# Patient Record
Sex: Male | Born: 1950
Health system: Southern US, Community
[De-identification: ages and names within clinical notes are randomized; demographics above are authoritative.]

## PROBLEM LIST (undated history)

## (undated) DIAGNOSIS — M21611 Bunion of right foot: Secondary | ICD-10-CM

## (undated) DIAGNOSIS — M1990 Unspecified osteoarthritis, unspecified site: Secondary | ICD-10-CM

## (undated) DIAGNOSIS — C169 Malignant neoplasm of stomach, unspecified: Secondary | ICD-10-CM

## (undated) DIAGNOSIS — I447 Left bundle-branch block, unspecified: Secondary | ICD-10-CM

## (undated) DIAGNOSIS — I2510 Atherosclerotic heart disease of native coronary artery without angina pectoris: Secondary | ICD-10-CM

## (undated) DIAGNOSIS — I2729 Other secondary pulmonary hypertension: Secondary | ICD-10-CM

## (undated) DIAGNOSIS — I10 Essential (primary) hypertension: Secondary | ICD-10-CM

## (undated) DIAGNOSIS — Z95810 Presence of automatic (implantable) cardiac defibrillator: Secondary | ICD-10-CM

## (undated) DIAGNOSIS — I428 Other cardiomyopathies: Secondary | ICD-10-CM

## (undated) DIAGNOSIS — I5042 Chronic combined systolic (congestive) and diastolic (congestive) heart failure: Secondary | ICD-10-CM

## (undated) DIAGNOSIS — T783XXA Angioneurotic edema, initial encounter: Secondary | ICD-10-CM

## (undated) DIAGNOSIS — Z86010 Personal history of colonic polyps: Secondary | ICD-10-CM

## (undated) DIAGNOSIS — K5730 Diverticulosis of large intestine without perforation or abscess without bleeding: Secondary | ICD-10-CM

## (undated) DIAGNOSIS — C61 Malignant neoplasm of prostate: Secondary | ICD-10-CM

## (undated) DIAGNOSIS — E119 Type 2 diabetes mellitus without complications: Secondary | ICD-10-CM

## (undated) DIAGNOSIS — Z8679 Personal history of other diseases of the circulatory system: Secondary | ICD-10-CM

## (undated) HISTORY — DX: Left bundle-branch block, unspecified: I44.7

## (undated) HISTORY — DX: Other secondary pulmonary hypertension: I27.29

## (undated) HISTORY — DX: Type 2 diabetes mellitus without complications: E11.9

## (undated) HISTORY — DX: Angioneurotic edema, initial encounter: T78.3XXA

## (undated) HISTORY — PX: INSERT / REPLACE / REMOVE PACEMAKER: SUR710

## (undated) HISTORY — DX: Other cardiomyopathies: I42.8

## (undated) HISTORY — DX: Atherosclerotic heart disease of native coronary artery without angina pectoris: I25.10

## (undated) HISTORY — DX: Chronic combined systolic (congestive) and diastolic (congestive) heart failure: I50.42

---

## 2007-02-13 HISTORY — PX: OTHER SURGICAL HISTORY: SHX169

## 2013-02-02 ENCOUNTER — Ambulatory Visit (INDEPENDENT_AMBULATORY_CARE_PROVIDER_SITE_OTHER): Payer: BC Managed Care – PPO | Admitting: Podiatry

## 2013-02-02 ENCOUNTER — Encounter: Payer: Self-pay | Admitting: Podiatry

## 2013-02-02 VITALS — BP 136/96 | HR 86 | Resp 12

## 2013-02-02 DIAGNOSIS — M79609 Pain in unspecified limb: Secondary | ICD-10-CM

## 2013-02-02 DIAGNOSIS — B351 Tinea unguium: Secondary | ICD-10-CM

## 2013-02-02 NOTE — Progress Notes (Signed)
Subjective:     Patient ID: Jon Williams, male   DOB: [birth year], 62 y.o.   MRN: [id number]  HPI patient presents with thick dystrophic nails of both feet that are impossible for him to cut and painful   Review of Systems     Objective:   Physical Exam Neurovascular status intact with no health history changes noted thick damage nail plates 1-5 both feet that are painful when pressed    Assessment:     Mycotic nail infection with pain 1-5 both feet    Plan:     Debridement painful nail bed 1-5 both feet with no bleeding noted

## 2013-08-03 ENCOUNTER — Ambulatory Visit: Payer: BC Managed Care – PPO | Admitting: Podiatry

## 2013-08-03 ENCOUNTER — Ambulatory Visit (INDEPENDENT_AMBULATORY_CARE_PROVIDER_SITE_OTHER): Payer: BC Managed Care – PPO | Admitting: Podiatry

## 2013-08-03 ENCOUNTER — Encounter: Payer: Self-pay | Admitting: Podiatry

## 2013-08-03 DIAGNOSIS — B351 Tinea unguium: Secondary | ICD-10-CM

## 2013-08-03 DIAGNOSIS — M79673 Pain in unspecified foot: Secondary | ICD-10-CM

## 2013-08-03 DIAGNOSIS — M79609 Pain in unspecified limb: Secondary | ICD-10-CM

## 2013-08-03 NOTE — Progress Notes (Signed)
Subjective:     Patient ID: Jon Williams, male   DOB: 1950/05/29, 63 y.o.   MRN: [id number]  HPI patient presents with thick nailbeds 1-5 both feet that are bothersome and cannot be taken care of by him and also lesions between the big toe second toe right   Review of Systems     Objective:   Physical Exam Neurovascular status intact with moderate structural deformity and nails that are thickened and painful 1-5 both feet    Assessment:     Mycotic nail infection and structural deformity leading to callus formation    Plan:     Debridement nailbeds 1-5 both feet lesions on right foot and reappoint in 3 months

## 2013-11-09 ENCOUNTER — Ambulatory Visit (INDEPENDENT_AMBULATORY_CARE_PROVIDER_SITE_OTHER): Payer: Commercial Managed Care - HMO | Admitting: Podiatry

## 2013-11-09 DIAGNOSIS — L84 Corns and callosities: Secondary | ICD-10-CM

## 2013-11-09 DIAGNOSIS — B351 Tinea unguium: Secondary | ICD-10-CM

## 2013-11-09 DIAGNOSIS — M79609 Pain in unspecified limb: Secondary | ICD-10-CM

## 2013-11-09 DIAGNOSIS — M79673 Pain in unspecified foot: Secondary | ICD-10-CM

## 2013-11-09 DIAGNOSIS — M779 Enthesopathy, unspecified: Secondary | ICD-10-CM

## 2013-11-09 NOTE — Progress Notes (Signed)
   Subjective:    Patient ID: Jon Williams, male    DOB: 29-Nov-1950, 63 y.o.   MRN: [id number]  HPI  Pt presents for nail debridement  Review of Systems     Objective:   Physical Exam        Assessment & Plan:

## 2013-11-11 NOTE — Progress Notes (Signed)
Subjective:     Patient ID: Jon Williams, male   DOB: 03/21/1950, 63 y.o.   MRN: [id number]  HPI patient presents with nail disease 1-5 both feet that he cannot cut and are thick and painful   Review of Systems     Objective:   Physical Exam Neurovascular status intact with thick yellow brittle nailbeds 1-5 both feet that are painful    Assessment:     Mycotic nail infection with pain 1-5 both feet    Plan:     Debride painful nailbeds 1-5 both feet with no iatrogenic bleeding noted      

## 2013-11-23 ENCOUNTER — Other Ambulatory Visit: Payer: BC Managed Care – PPO

## 2014-02-15 ENCOUNTER — Ambulatory Visit (INDEPENDENT_AMBULATORY_CARE_PROVIDER_SITE_OTHER): Payer: Commercial Managed Care - HMO | Admitting: Podiatry

## 2014-02-15 DIAGNOSIS — B351 Tinea unguium: Secondary | ICD-10-CM

## 2014-02-15 DIAGNOSIS — M79673 Pain in unspecified foot: Secondary | ICD-10-CM | POA: Diagnosis not present

## 2014-02-15 NOTE — Progress Notes (Signed)
Subjective:     Patient ID: Jon Williams, male   DOB: 08/27/1950, 64 y.o.   MRN: [id number]  HPI patient presents with nail disease 1-5 both feet that he cannot cut and are thick and painful   Review of Systems     Objective:   Physical Exam Neurovascular status intact with thick yellow brittle nailbeds 1-5 both feet that are painful    Assessment:     Mycotic nail infection with pain 1-5 both feet    Plan:     Debride painful nailbeds 1-5 both feet with no iatrogenic bleeding noted

## 2014-02-22 DIAGNOSIS — N400 Enlarged prostate without lower urinary tract symptoms: Secondary | ICD-10-CM | POA: Diagnosis not present

## 2014-05-10 ENCOUNTER — Ambulatory Visit (INDEPENDENT_AMBULATORY_CARE_PROVIDER_SITE_OTHER): Payer: Commercial Managed Care - HMO | Admitting: Podiatry

## 2014-05-10 DIAGNOSIS — M79673 Pain in unspecified foot: Secondary | ICD-10-CM

## 2014-05-10 DIAGNOSIS — L84 Corns and callosities: Secondary | ICD-10-CM

## 2014-05-10 DIAGNOSIS — B351 Tinea unguium: Secondary | ICD-10-CM | POA: Diagnosis not present

## 2014-05-10 NOTE — Progress Notes (Signed)
Subjective:     Patient ID: Jon Williams, male   DOB: 02-Nov-1950, 64 y.o.   MRN: [id number]  HPI patient presents with nail disease 1-5 both feet that he cannot cut and are thick and painful. Patient also has lesions on both feet which are sore and he cannot cut   Review of Systems     Objective:   Physical Exam Neurovascular status intact with thick yellow brittle nailbeds 1-5 both feet that are painful and also noted to have thick keratotic lesions that are painful    Assessment:     Mycotic nail infection with pain 1-5 both feet with keratotic lesion formation    Plan:     Debride painful nailbeds 1-5 both feet with no iatrogenic bleeding noted and debrided lesions plantar feet with no iatrogenic bleeding noted

## 2014-08-11 ENCOUNTER — Emergency Department (HOSPITAL_COMMUNITY)
Admission: EM | Admit: 2014-08-11 | Discharge: 2014-08-11 | Disposition: A | Discharge status: Home / Self Care | Payer: Commercial Managed Care - HMO | Attending: Emergency Medicine | Admitting: Emergency Medicine

## 2014-08-11 ENCOUNTER — Encounter (HOSPITAL_COMMUNITY): Payer: Self-pay | Admitting: *Deleted

## 2014-08-11 ENCOUNTER — Emergency Department (HOSPITAL_COMMUNITY): Payer: Commercial Managed Care - HMO

## 2014-08-11 DIAGNOSIS — M2011 Hallux valgus (acquired), right foot: Secondary | ICD-10-CM | POA: Diagnosis not present

## 2014-08-11 DIAGNOSIS — M79671 Pain in right foot: Secondary | ICD-10-CM | POA: Diagnosis present

## 2014-08-11 DIAGNOSIS — L03031 Cellulitis of right toe: Secondary | ICD-10-CM | POA: Diagnosis not present

## 2014-08-11 DIAGNOSIS — M19071 Primary osteoarthritis, right ankle and foot: Secondary | ICD-10-CM | POA: Diagnosis not present

## 2014-08-11 MED ORDER — CLINDAMYCIN HCL 150 MG PO CAPS: 300.0000 mg | ORAL_CAPSULE | Freq: Four times a day (QID) | ORAL | Status: DC

## 2014-08-11 MED ORDER — OXYCODONE-ACETAMINOPHEN 5-325 MG PO TABS
ORAL_TABLET | ORAL | Status: AC
  Filled 2014-08-11: qty 1

## 2014-08-11 MED ORDER — HYDROCODONE-ACETAMINOPHEN 5-325 MG PO TABS: 2.0000 | ORAL_TABLET | ORAL | Status: DC | PRN

## 2014-08-11 MED ORDER — OXYCODONE-ACETAMINOPHEN 5-325 MG PO TABS
1.0000 | ORAL_TABLET | Freq: Once | ORAL | Status: AC
  Administered 2014-08-11: 1 via ORAL

## 2014-08-11 NOTE — ED Notes (Signed)
Pt reports having swelling and pain to big toe and foot, states he had blistered area that has ruptured and now bleeding. Bleeding controlled at this time.

## 2014-08-11 NOTE — Discharge Instructions (Signed)

## 2014-08-11 NOTE — ED Provider Notes (Signed)
CSN: [id number]     Arrival date & time 08/11/14  1840 History   First MD Initiated Contact with Patient 08/11/14 2016     Chief Complaint  Patient presents with  . Foot Pain     (Consider location/radiation/quality/duration/timing/severity/associated sxs/prior Treatment) HPI Comments: Patient presents to the ER for evaluation of pain, swelling and bleeding from a lesion that has been on the inside portion of his big toe for some time. Patient reports that the area ruptured earlier today and has been draining blood and fluid. He was unable to see his doctor today, will see him tomorrow.  Patient is a 64 y.o. male presenting with lower extremity pain.  Foot Pain    History reviewed. No pertinent past medical history. History reviewed. No pertinent past surgical history. History reviewed. No pertinent family history. History  Substance Use Topics  . Smoking status: Never Smoker   . Smokeless tobacco: Not on file  . Alcohol Use: No    Review of Systems  Skin: Positive for wound.  All other systems reviewed and are negative.     Allergies  Aspirin and Sulfa antibiotics  Home Medications   Prior to Admission medications   Not on File   BP 163/79 mmHg  Pulse 63  Temp(Src) 98.1 F (36.7 C) (Oral)  Resp 18  SpO2 100% Physical Exam  Constitutional: He is oriented to person, place, and time. He appears well-developed and well-nourished. No distress.  HENT:  Head: Normocephalic and atraumatic.  Right Ear: Hearing normal.  Left Ear: Hearing normal.  Nose: Nose normal.  Mouth/Throat: Oropharynx is clear and moist and mucous membranes are normal.  Eyes: Conjunctivae and EOM are normal. Pupils are equal, round, and reactive to light.  Neck: Normal range of motion. Neck supple.  Cardiovascular: Regular rhythm, S1 normal and S2 normal.  Exam reveals no gallop and no friction rub.   No murmur heard. Pulmonary/Chest: Effort normal and breath sounds normal. No respiratory  distress. He exhibits no tenderness.  Abdominal: Soft. Normal appearance and bowel sounds are normal. There is no hepatosplenomegaly. There is no tenderness. There is no rebound, no guarding, no tenderness at McBurney's point and negative Murphy's sign. No hernia.  Musculoskeletal: Normal range of motion.  Neurological: He is alert and oriented to person, place, and time. He has normal strength. No cranial nerve deficit or sensory deficit. Coordination normal. GCS eye subscore is 4. GCS verbal subscore is 5. GCS motor subscore is 6.  Skin: Skin is warm, dry and intact. No rash noted. No cyanosis.  Area of bleeding on the medial aspect of great toe and the toe web space with small raised lesion consistent with thickened callus or corn formation  Psychiatric: He has a normal mood and affect. His speech is normal and behavior is normal. Thought content normal.  Nursing note and vitals reviewed.   ED Course  Procedures (including critical care time) Labs Review Labs Reviewed - No data to display  Imaging Review Dg Foot Complete Right  08/11/2014   CLINICAL DATA:  64 year old male with right foot pain localizing to the great toe  EXAM: RIGHT FOOT COMPLETE - 3+ VIEW  COMPARISON:  None.  FINDINGS: Mild hallux valgus angulation of the great toe with bony bunion formation. There is associated secondary degenerative osteoarthritis. Trophic changes are present at the nail of the great toe as well. There is no evidence of acute fracture or malalignment. Normal bony mineralization. No lytic or blastic osseous lesion.  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   Electronically Signed   By: Jacqulynn Cadet M.D.   On: 08/11/2014 21:40     EKG Interpretation None      MDM   Final diagnoses:  None    Spontaneous drainage and bleeding from an area where there was a pre-existing lesion in the webspace of  his first and second toes on the right foot. X-ray does not show any evidence of osteomyelitis. Will cover with antibiotic, analgesia, follow up with his doctors.    Orpah Greek, MD 08/11/14 2151

## 2014-08-11 NOTE — ED Notes (Signed)
Dr. Pollina at bedside   

## 2014-08-12 ENCOUNTER — Encounter: Payer: Self-pay | Admitting: Podiatry

## 2014-08-12 ENCOUNTER — Ambulatory Visit (INDEPENDENT_AMBULATORY_CARE_PROVIDER_SITE_OTHER): Payer: Commercial Managed Care - HMO | Admitting: Podiatry

## 2014-08-12 VITALS — BP 144/80 | HR 46 | Resp 15

## 2014-08-12 DIAGNOSIS — M79606 Pain in leg, unspecified: Secondary | ICD-10-CM | POA: Diagnosis not present

## 2014-08-12 DIAGNOSIS — L84 Corns and callosities: Secondary | ICD-10-CM

## 2014-08-12 DIAGNOSIS — B351 Tinea unguium: Secondary | ICD-10-CM | POA: Diagnosis not present

## 2014-08-12 NOTE — Progress Notes (Signed)
Subjective:     Patient ID: Jon Williams, male   DOB: [birth year], 64 y.o.   MRN: [id number]  HPI patient presents with lesions between the big toe second toe of both feet and presents with nail disease 1-5 both feet with thick yellow brittle debris. Also has significant structural deformity of both feet in the forefoot with bunion and digital deformities noted   Review of Systems     Objective:   Physical Exam Neurovascular status unchanged with significant structural deformity of both feet and severe keratotic lesions between the hallux and second toes that are painful along with thick and yellow brittle nailbeds 1-5 of both feet    Assessment:     Lesion secondary to pressure along with mycotic nail infection 1-5 both feet with pain    Plan:     Reviewed both conditions and today I debrided nailbeds 1-5 both feet with no iatrogenic bleeding and lesions between the big toe and second toe with no iatrogenic bleeding and will reappoint for routine care with consideration some day for surgery that we will like to avoid as much as possible

## 2014-08-18 ENCOUNTER — Ambulatory Visit: Payer: Commercial Managed Care - HMO | Admitting: Podiatry

## 2014-09-06 ENCOUNTER — Ambulatory Visit: Payer: Commercial Managed Care - HMO

## 2014-10-04 DIAGNOSIS — Z0100 Encounter for examination of eyes and vision without abnormal findings: Secondary | ICD-10-CM | POA: Diagnosis not present

## 2014-10-21 ENCOUNTER — Ambulatory Visit (INDEPENDENT_AMBULATORY_CARE_PROVIDER_SITE_OTHER): Payer: Commercial Managed Care - HMO | Admitting: Podiatry

## 2014-10-21 ENCOUNTER — Encounter: Payer: Self-pay | Admitting: Podiatry

## 2014-10-21 DIAGNOSIS — B351 Tinea unguium: Secondary | ICD-10-CM

## 2014-10-21 DIAGNOSIS — L84 Corns and callosities: Secondary | ICD-10-CM | POA: Diagnosis not present

## 2014-10-21 DIAGNOSIS — M79606 Pain in leg, unspecified: Secondary | ICD-10-CM

## 2014-10-21 DIAGNOSIS — L97511 Non-pressure chronic ulcer of other part of right foot limited to breakdown of skin: Secondary | ICD-10-CM

## 2014-10-21 NOTE — Progress Notes (Signed)
Patient ID: Jon Williams, male   DOB: Oct 24, 1950, 64 y.o.   MRN: [id number] Complaint:  Visit Type: Patient returns to my office for continued preventative foot care services. Complaint: Patient states" my nails have grown long and thick and become painful to walk and wear shoes" Patient has been diagnosed with DM with no foot complications. The patient presents for preventative foot care services. No changes to ROS.  He states he develops severe callus between his first na second toes both feet.  Podiatric Exam: Vascular: dorsalis pedis and posterior tibial pulses are palpable bilateral. Capillary return is immediate. Temperature gradient is WNL. Skin turgor WNL  Sensorium: Normal Semmes Weinstein monofilament test. Normal tactile sensation bilaterally. Nail Exam: Pt has thick disfigured discolored nails with subungual debris noted bilateral entire nail hallux through fifth toenails Ulcer Exam: There is no evidence of ulcer or pre-ulcerative changes or infection. Orthopedic Exam: Muscle tone and strength are WNL. No limitations in general ROM. No crepitus or effusions noted. Foot type and digits show no abnormalities. Bony prominences are unremarkable. 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 Skin: No Porokeratosis. No infection or ulcers.  Necrotic tissue noted great toe lateral aspect at the site his second toe borders great toe right foot.  Diagnosis:  Onychomycosis, , Pain in right toe, pain in left toes  Ulcer great toe 1st Interspaces B/L  Treatment & Plan Procedures and Treatment: Consent by patient was obtained for treatment procedures. The patient understood the discussion of treatment and procedures well. All questions were answered thoroughly reviewed. Debridement of mycotic and hypertrophic toenails, 1 through 5 bilateral and clearing of subungual debris. No ulceration, no infection noted. Debridement of necrotic tissue right hallux.  Return Visit-Office Procedure: Patient  instructed to return to the office for a follow up visit 3 months for continued evaluation and treatment.

## 2014-12-23 ENCOUNTER — Ambulatory Visit (INDEPENDENT_AMBULATORY_CARE_PROVIDER_SITE_OTHER): Payer: Commercial Managed Care - HMO | Admitting: Podiatry

## 2014-12-23 DIAGNOSIS — L84 Corns and callosities: Secondary | ICD-10-CM | POA: Diagnosis not present

## 2014-12-23 DIAGNOSIS — B351 Tinea unguium: Secondary | ICD-10-CM | POA: Diagnosis not present

## 2014-12-23 DIAGNOSIS — M79606 Pain in leg, unspecified: Secondary | ICD-10-CM

## 2014-12-23 NOTE — Progress Notes (Signed)
Patient ID: KU LEISNER, male   DOB: 1950/06/15, 64 y.o.   MRN: [id number] Complaint:  Visit Type: Patient returns to my office for continued preventative foot care services. Complaint: Patient states" my nails have grown long and thick and become painful to walk and wear shoes" Patient has been diagnosed with DM with no foot complications. The patient presents for preventative foot care services. No changes to ROS.  He states he develops severe callus between his first and second toes right   Foot today..  Podiatric Exam: Vascular: dorsalis pedis and posterior tibial pulses are palpable bilateral. Capillary return is immediate. Temperature gradient is WNL. Skin turgor WNL  Sensorium: Normal Semmes Weinstein monofilament test. Normal tactile sensation bilaterally. Nail Exam: Pt has thick disfigured discolored nails with subungual debris noted bilateral entire nail hallux through fifth toenails Ulcer Exam: There is no evidence of ulcer or pre-ulcerative changes or infection. Orthopedic Exam: Muscle tone and strength are WNL. No limitations in general ROM. No crepitus or effusions noted. Foot type and digits show no abnormalities. Bony prominences are unremarkable. 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 Skin: No Porokeratosis. No infection or ulcers.  Necrotic tissue noted great toe lateral aspect at the site his second toe borders great toe right foot.  Diagnosis:  Onychomycosis, , Pain in right toe, pain in left toes  Ulcer great toe 1st Interspaces right  Treatment & Plan Procedures and Treatment: Consent by patient was obtained for treatment procedures. The patient understood the discussion of treatment and procedures well. All questions were answered thoroughly reviewed. Debridement of mycotic and hypertrophic toenails, 1 through 5 bilateral and clearing of subungual debris. No ulceration, no infection noted. Debridement of necrotic tissue right hallux.  Return Visit-Office Procedure:  Patient instructed to return to the office for a follow up visit 3 months for continued evaluation and treatment.   Gardiner Barefoot DPM

## 2015-01-20 ENCOUNTER — Ambulatory Visit: Payer: Commercial Managed Care - HMO | Admitting: Podiatry

## 2015-02-25 DIAGNOSIS — N401 Enlarged prostate with lower urinary tract symptoms: Secondary | ICD-10-CM | POA: Diagnosis not present

## 2015-02-25 DIAGNOSIS — N138 Other obstructive and reflux uropathy: Secondary | ICD-10-CM | POA: Diagnosis not present

## 2015-02-26 DIAGNOSIS — Z0100 Encounter for examination of eyes and vision without abnormal findings: Secondary | ICD-10-CM | POA: Diagnosis not present

## 2015-03-03 ENCOUNTER — Ambulatory Visit (INDEPENDENT_AMBULATORY_CARE_PROVIDER_SITE_OTHER): Payer: BLUE CROSS/BLUE SHIELD | Admitting: Podiatry

## 2015-03-03 ENCOUNTER — Encounter: Payer: Self-pay | Admitting: Podiatry

## 2015-03-03 DIAGNOSIS — M79606 Pain in leg, unspecified: Secondary | ICD-10-CM

## 2015-03-03 DIAGNOSIS — L97511 Non-pressure chronic ulcer of other part of right foot limited to breakdown of skin: Secondary | ICD-10-CM

## 2015-03-03 DIAGNOSIS — B351 Tinea unguium: Secondary | ICD-10-CM

## 2015-03-03 DIAGNOSIS — M79673 Pain in unspecified foot: Secondary | ICD-10-CM | POA: Diagnosis not present

## 2015-03-03 NOTE — Progress Notes (Signed)
Patient ID: Jon Williams, male   DOB: 1950/04/12, 65 y.o.   MRN: [id number] Complaint:  Visit Type: Patient returns to my office for continued preventative foot care services. Complaint: Patient states" my nails have grown long and thick and become painful to walk and wear shoes" . The patient presents for preventative foot care services. No changes to ROS.  He states he develops severe callus between his first and second toes right   foot today..  Podiatric Exam: Vascular: dorsalis pedis and posterior tibial pulses are palpable bilateral. Capillary return is immediate. Temperature gradient is WNL. Skin turgor WNL  Sensorium: Normal Semmes Weinstein monofilament test. Normal tactile sensation bilaterally. Nail Exam: Pt has thick disfigured discolored nails with subungual debris noted bilateral entire nail hallux through fifth toenails Ulcer Exam: There is no evidence of ulcer or pre-ulcerative changes or infection. Orthopedic Exam: Muscle tone and strength are WNL. No limitations in general ROM. No crepitus or effusions noted. Foot type and digits show no abnormalities. Bony prominences are unremarkable. 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 Skin: No Porokeratosis. No infection or ulcers.  Necrotic tissue noted great toe lateral aspect at the site his second toe borders great toe right foot.  Diagnosis:  Onychomycosis, , Pain in right toe, pain in left toes  Ulcer great toe 1st Interspaces right   Gardiner Barefoot DPM Treatment & Plan Procedures and Treatment: Consent by patient was obtained for treatment procedures. The patient understood the discussion of treatment and procedures well. All questions were answered thoroughly reviewed. Debridement of mycotic and hypertrophic toenails, 1 through 5 bilateral and clearing of subungual debris. No ulceration, no infection noted. Debridement of necrotic tissue right hallux.  Return Visit-Office Procedure: Patient instructed to return to the  office for a follow up visit 3 months for continued evaluation and treatment.   Gardiner Barefoot DPM

## 2015-05-02 DIAGNOSIS — R972 Elevated prostate specific antigen [PSA]: Secondary | ICD-10-CM | POA: Diagnosis not present

## 2015-05-11 ENCOUNTER — Encounter: Payer: Self-pay | Admitting: Podiatry

## 2015-05-11 ENCOUNTER — Ambulatory Visit (INDEPENDENT_AMBULATORY_CARE_PROVIDER_SITE_OTHER): Payer: BLUE CROSS/BLUE SHIELD | Admitting: Podiatry

## 2015-05-11 DIAGNOSIS — B351 Tinea unguium: Secondary | ICD-10-CM

## 2015-05-11 DIAGNOSIS — L97511 Non-pressure chronic ulcer of other part of right foot limited to breakdown of skin: Secondary | ICD-10-CM | POA: Diagnosis not present

## 2015-05-11 DIAGNOSIS — M79606 Pain in leg, unspecified: Secondary | ICD-10-CM

## 2015-05-11 NOTE — Progress Notes (Signed)
Patient ID: Jon Williams, male   DOB: 1950/04/08, 65 y.o.   MRN: [id number] Complaint:  Visit Type: Patient returns to my office for continued preventative foot care services. Complaint: Patient states" my nails have grown long and thick and become painful to walk and wear shoes" . The patient presents for preventative foot care services. No changes to ROS.  He states he develops severe callus between his first and second toes right   foot today..  Podiatric Exam: Vascular: dorsalis pedis and posterior tibial pulses are palpable bilateral. Capillary return is immediate. Temperature gradient is WNL. Skin turgor WNL  Sensorium: Normal Semmes Weinstein monofilament test. Normal tactile sensation bilaterally. Nail Exam: Pt has thick disfigured discolored nails with subungual debris noted bilateral entire nail hallux through fifth toenails Ulcer Exam: There is no evidence of ulcer or pre-ulcerative changes or infection. Orthopedic Exam: Muscle tone and strength are WNL. No limitations in general ROM. No crepitus or effusions noted. Foot type and digits show no abnormalities. Bony prominences are unremarkable. 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 Skin: No Porokeratosis. No infection or ulcers.  Necrotic tissue noted great toe lateral aspect at the site his second toe borders great toe right foot.  Diagnosis:  Onychomycosis, , Pain in right toe, pain in left toes  Ulcer great toe 1st Interspaces right   Gardiner Barefoot DPM Treatment & Plan Procedures and Treatment: Consent by patient was obtained for treatment procedures. The patient understood the discussion of treatment and procedures well. All questions were answered thoroughly reviewed. Debridement of mycotic and hypertrophic toenails, 1 through 5 bilateral and clearing of subungual debris. No ulceration, no infection noted. Debridement of necrotic tissue right hallux.  Return Visit-Office Procedure: Patient instructed to return to the  office for a follow up visit 10 weeks. for continued evaluation and treatment.   Gardiner Barefoot DPM

## 2015-07-08 ENCOUNTER — Telehealth: Payer: Self-pay | Admitting: Podiatry

## 2015-07-08 NOTE — Telephone Encounter (Signed)
Pt lvm  requarding possibly moving appt up and or confirmation of his appt. I returned call and lvm for pt to call to possibly move it to 6.1.17.

## 2015-07-20 ENCOUNTER — Ambulatory Visit (INDEPENDENT_AMBULATORY_CARE_PROVIDER_SITE_OTHER): Payer: BLUE CROSS/BLUE SHIELD | Admitting: Podiatry

## 2015-07-20 ENCOUNTER — Encounter: Payer: Self-pay | Admitting: Podiatry

## 2015-07-20 DIAGNOSIS — B351 Tinea unguium: Secondary | ICD-10-CM | POA: Diagnosis not present

## 2015-07-20 DIAGNOSIS — M79676 Pain in unspecified toe(s): Secondary | ICD-10-CM | POA: Diagnosis not present

## 2015-07-20 DIAGNOSIS — L97511 Non-pressure chronic ulcer of other part of right foot limited to breakdown of skin: Secondary | ICD-10-CM

## 2015-07-20 DIAGNOSIS — M79606 Pain in leg, unspecified: Secondary | ICD-10-CM

## 2015-07-20 NOTE — Progress Notes (Signed)
Patient ID: Jon Williams, male   DOB: 10-Nov-1950, 65 y.o.   MRN: [id number] Complaint:  Visit Type: Patient returns to my office for continued preventative foot care services. Complaint: Patient states" my nails have grown long and thick and become painful to walk and wear shoes" . The patient presents for preventative foot care services. No changes to ROS.  He states he develops severe callus between his first and second toes right   foot today..  Podiatric Exam: Vascular: dorsalis pedis and posterior tibial pulses are palpable bilateral. Capillary return is immediate. Temperature gradient is WNL. Skin turgor WNL  Sensorium: Normal Semmes Weinstein monofilament test. Normal tactile sensation bilaterally. Nail Exam: Pt has thick disfigured discolored nails with subungual debris noted bilateral entire nail hallux through fifth toenails Ulcer Exam: There is no evidence of ulcer or pre-ulcerative changes or infection. Orthopedic Exam: Muscle tone and strength are WNL. No limitations in general ROM. No crepitus or effusions noted. Foot type and digits show no abnormalities. Bony prominences are unremarkable. 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 Skin: No Porokeratosis. No infection or ulcers.  Necrotic tissue noted great toe lateral aspect at the site his second toe borders great toe right foot. HM 1st interspace left foot.  Diagnosis:  Onychomycosis, , Pain in right toe, pain in left toes  Ulcer great toe 1st Interspaces right   Gardiner Barefoot DPM Treatment & Plan Procedures and Treatment: Consent by patient was obtained for treatment procedures. The patient understood the discussion of treatment and procedures well. All questions were answered thoroughly reviewed. Debridement of mycotic and hypertrophic toenails, 1 through 5 bilateral and clearing of subungual debris. No ulceration, no infection noted. Debridement of necrotic tissue right hallux B/L  Return Visit-Office Procedure: Patient  instructed to return to the office for a follow up visit 10 weeks. for continued evaluation and treatment.   Gardiner Barefoot DPM

## 2015-09-28 ENCOUNTER — Encounter: Payer: Self-pay | Admitting: Podiatry

## 2015-09-28 ENCOUNTER — Ambulatory Visit (INDEPENDENT_AMBULATORY_CARE_PROVIDER_SITE_OTHER): Payer: BLUE CROSS/BLUE SHIELD | Admitting: Podiatry

## 2015-09-28 DIAGNOSIS — B351 Tinea unguium: Secondary | ICD-10-CM | POA: Diagnosis not present

## 2015-09-28 DIAGNOSIS — M79606 Pain in leg, unspecified: Secondary | ICD-10-CM | POA: Diagnosis not present

## 2015-09-28 NOTE — Progress Notes (Signed)
Patient ID: [name], male   DOB: [birth year], 65 y.o.   MRN: [id number] Complaint:  Visit Type: Patient returns to my office for continued preventative foot care services. Complaint: Patient states" my nails have grown long and thick and become painful to walk and wear shoes" . The patient presents for preventative foot care services. No changes to ROS.  He states he develops severe callus between his first and second toes but he has been working in it and it has improved..  Podiatric Exam: Vascular: dorsalis pedis and posterior tibial pulses are palpable bilateral. Capillary return is immediate. Temperature gradient is WNL. Skin turgor WNL  Sensorium: Normal Semmes Weinstein monofilament test. Normal tactile sensation bilaterally. Nail Exam: Pt has thick disfigured discolored nails with subungual debris noted bilateral entire nail hallux through fifth toenails Ulcer Exam: There is no evidence of ulcer or pre-ulcerative changes or infection. Orthopedic Exam: Muscle tone and strength are WNL. No limitations in general ROM. No crepitus or effusions noted. Foot type and digits show no abnormalities. Bony prominences are unremarkable. 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 Skin: No Porokeratosis. No infection or ulcer.  No callus noted 1/2  B/L.  Diagnosis:  Onychomycosis, , Pain in right toe, pain in left toes  Ul   Gardiner Barefoot DPM Treatment & Plan Procedures and Treatment: Consent by patient was obtained for treatment procedures. The patient understood the discussion of treatment and procedures well. All questions were answered thoroughly reviewed. Debridement of mycotic and hypertrophic toenails, 1 through 5 bilateral and clearing of subungual debris. No ulceration, no infection noted.   Return Visit-Office Procedure: Patient instructed to return to the office for a follow up visit 9 weeks. for continued evaluation and treatment.   Gardiner Barefoot DPM

## 2015-11-30 ENCOUNTER — Ambulatory Visit (INDEPENDENT_AMBULATORY_CARE_PROVIDER_SITE_OTHER): Payer: BLUE CROSS/BLUE SHIELD | Admitting: Podiatry

## 2015-11-30 ENCOUNTER — Encounter: Payer: Self-pay | Admitting: Podiatry

## 2015-11-30 VITALS — BP 153/92 | HR 57 | Resp 14

## 2015-11-30 DIAGNOSIS — L97511 Non-pressure chronic ulcer of other part of right foot limited to breakdown of skin: Secondary | ICD-10-CM

## 2015-11-30 DIAGNOSIS — B351 Tinea unguium: Secondary | ICD-10-CM | POA: Diagnosis not present

## 2015-11-30 DIAGNOSIS — M79676 Pain in unspecified toe(s): Secondary | ICD-10-CM | POA: Diagnosis not present

## 2015-11-30 DIAGNOSIS — L84 Corns and callosities: Secondary | ICD-10-CM | POA: Diagnosis not present

## 2015-11-30 DIAGNOSIS — M79606 Pain in leg, unspecified: Secondary | ICD-10-CM

## 2015-11-30 NOTE — Progress Notes (Signed)
Patient ID: Jon Williams, male   DOB: 06/30/1950, 65 y.o.   MRN: [id number] Complaint:  Visit Type: Patient returns to my office for continued preventative foot care services. Complaint: Patient states" my nails have grown long and thick and become painful to walk and wear shoes" . The patient presents for preventative foot care services. No changes to ROS.  He states he develops severe callus between his first and second toes but he has been working in it and it has improved..  Podiatric Exam: Vascular: dorsalis pedis and posterior tibial pulses are palpable bilateral. Capillary return is immediate. Temperature gradient is WNL. Skin turgor WNL  Sensorium: Normal Semmes Weinstein monofilament test. Normal tactile sensation bilaterally. Nail Exam: Pt has thick disfigured discolored nails with subungual debris noted bilateral entire nail hallux through fifth toenails Ulcer Exam: There is no evidence of ulcer or pre-ulcerative changes or infection. Orthopedic Exam: Muscle tone and strength are WNL. No limitations in general ROM. No crepitus or effusions noted. Foot type and digits show no abnormalities. Bony prominences are unremarkable. 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 Skin: No Porokeratosis. No infection or ulcer.   callus noted 1/2  B/L.  Diagnosis:  Onychomycosis, , Pain in right toe, pain in left toes  Callus 1/2  B/L   Tee Richeson DPM Treatment & Plan Procedures and Treatment: Consent by patient was obtained for treatment procedures. The patient understood the discussion of treatment and procedures well. All questions were answered thoroughly reviewed. Debridement of mycotic and hypertrophic toenails, 1 through 5 bilateral and clearing of subungual debris. No ulceration, no infection noted.  Debride corn/ulcer right first interspace. Return Visit-Office Procedure: Patient instructed to return to the office for a follow up visit 9 weeks. for continued evaluation and  treatment.   Dalila Arca DPM 

## 2016-02-01 ENCOUNTER — Ambulatory Visit: Payer: Commercial Managed Care - HMO | Admitting: Podiatry

## 2016-02-01 DIAGNOSIS — I517 Cardiomegaly: Secondary | ICD-10-CM | POA: Diagnosis not present

## 2016-02-01 DIAGNOSIS — M7989 Other specified soft tissue disorders: Secondary | ICD-10-CM | POA: Diagnosis not present

## 2016-02-08 ENCOUNTER — Other Ambulatory Visit: Payer: Self-pay

## 2016-02-08 ENCOUNTER — Emergency Department (HOSPITAL_COMMUNITY): Payer: BLUE CROSS/BLUE SHIELD

## 2016-02-08 ENCOUNTER — Encounter (HOSPITAL_COMMUNITY): Payer: Self-pay

## 2016-02-08 ENCOUNTER — Emergency Department (HOSPITAL_COMMUNITY)
Admission: EM | Admit: 2016-02-08 | Discharge: 2016-02-08 | Disposition: A | Discharge status: Home / Self Care | Payer: BLUE CROSS/BLUE SHIELD | Attending: Emergency Medicine | Admitting: Emergency Medicine

## 2016-02-08 DIAGNOSIS — I509 Heart failure, unspecified: Secondary | ICD-10-CM | POA: Diagnosis not present

## 2016-02-08 DIAGNOSIS — Z79899 Other long term (current) drug therapy: Secondary | ICD-10-CM | POA: Insufficient documentation

## 2016-02-08 DIAGNOSIS — R0602 Shortness of breath: Secondary | ICD-10-CM | POA: Diagnosis not present

## 2016-02-08 LAB — CBC WITH DIFFERENTIAL/PLATELET
Basophils Absolute: 0 10*3/uL (ref 0.0–0.1)
Basophils Relative: 0 %
Eosinophils Absolute: 0.1 10*3/uL (ref 0.0–0.7)
Eosinophils Relative: 1 %
HCT: 40.6 % (ref 39.0–52.0)
Hemoglobin: 14.2 g/dL (ref 13.0–17.0)
Lymphocytes Relative: 23 %
Lymphs Abs: 1.8 10*3/uL (ref 0.7–4.0)
MCH: 31.7 pg (ref 26.0–34.0)
MCHC: 35 g/dL (ref 30.0–36.0)
MCV: 90.6 fL (ref 78.0–100.0)
Monocytes Absolute: 0.5 10*3/uL (ref 0.1–1.0)
Monocytes Relative: 6 %
Neutro Abs: 5.4 10*3/uL (ref 1.7–7.7)
Neutrophils Relative %: 70 %
Platelets: 194 10*3/uL (ref 150–400)
RBC: 4.48 MIL/uL (ref 4.22–5.81)
RDW: 13.3 % (ref 11.5–15.5)
WBC: 7.8 10*3/uL (ref 4.0–10.5)

## 2016-02-08 LAB — BASIC METABOLIC PANEL
Anion gap: 7 (ref 5–15)
BUN: 13 mg/dL (ref 6–20)
CO2: 22 mmol/L (ref 22–32)
Calcium: 9.1 mg/dL (ref 8.9–10.3)
Chloride: 108 mmol/L (ref 101–111)
Creatinine, Ser: 0.91 mg/dL (ref 0.61–1.24)
GFR calc Af Amer: >60 mL/min (ref >60)
GFR calc non Af Amer: >60 mL/min (ref >60)
Glucose, Bld: 103 mg/dL — ABNORMAL HIGH (ref 65–99)
Potassium: 3.9 mmol/L (ref 3.5–5.1)
Sodium: 137 mmol/L (ref 135–145)

## 2016-02-08 LAB — I-STAT TROPONIN, ED
Troponin i, poc: 0 ng/mL (ref 0.00–0.08)
Troponin i, poc: 0.01 ng/mL (ref 0.00–0.08)

## 2016-02-08 LAB — BRAIN NATRIURETIC PEPTIDE: B Natriuretic Peptide: 705.1 pg/mL — ABNORMAL HIGH (ref 0.0–100.0)

## 2016-02-08 MED ORDER — FUROSEMIDE 20 MG PO TABS: 20.0000 mg | ORAL_TABLET | Freq: Every day | ORAL | 0 refills | Status: DC

## 2016-02-08 NOTE — ED Provider Notes (Signed)
Cold sxs for several weeks.  One episode of SOB last night while laying in bed.  Was seen by PCP today for this, and was sent here for further evaluation.  EKG did show a new LBBB and LVH.  Plan delta trop. If negative, pt can f/u with cardiology outpt.     6:24 PM Normal delta trop.  However BNP elevated at 705.  Pt does report BLE leg swelling and increase SOB with laying flat thus suggesting CHF.  He is well appearing on exam, no respiratory distress.  Anticipate d/c with Lasix and outpt f/u with [facility] and PCP for further care.  Pt Sts he had an echocardiogram performed earlier today.   New onset CHF.  Care discussed with DR. Isaacs.  Pt prefers to go home and f/u.      BP (!) 143/103 (BP Location: Right Arm)   Pulse 88   Temp 97.8 F (36.6 C) (Oral)   Resp 20   Ht 5\' 6"  (1.676 m)   Wt 89.4 kg   SpO2 100%   BMI 31.80 kg/m   Results for orders placed or performed during the hospital encounter of 02/08/16  CBC with Differential  Result Value Ref Range   WBC 7.8 4.0 - 10.5 K/uL   RBC 4.48 4.22 - 5.81 MIL/uL   Hemoglobin 14.2 13.0 - 17.0 g/dL   HCT 40.6 39.0 - 52.0 %   MCV 90.6 78.0 - 100.0 fL   MCH 31.7 26.0 - 34.0 pg   MCHC 35.0 30.0 - 36.0 g/dL   RDW 13.3 11.5 - 15.5 %   Platelets 194 150 - 400 K/uL   Neutrophils Relative % 70 %   Neutro Abs 5.4 1.7 - 7.7 K/uL   Lymphocytes Relative 23 %   Lymphs Abs 1.8 0.7 - 4.0 K/uL   Monocytes Relative 6 %   Monocytes Absolute 0.5 0.1 - 1.0 K/uL   Eosinophils Relative 1 %   Eosinophils Absolute 0.1 0.0 - 0.7 K/uL   Basophils Relative 0 %   Basophils Absolute 0.0 0.0 - 0.1 K/uL  Basic metabolic panel  Result Value Ref Range   Sodium 137 135 - 145 mmol/L   Potassium 3.9 3.5 - 5.1 mmol/L   Chloride 108 101 - 111 mmol/L   CO2 22 22 - 32 mmol/L   Glucose, Bld 103 (H) 65 - 99 mg/dL   BUN 13 6 - 20 mg/dL   Creatinine, Ser 0.91 0.61 - 1.24 mg/dL   Calcium 9.1 8.9 - 10.3 mg/dL   GFR calc non Af Amer >60 >60 mL/min   GFR calc Af Amer >60  >60 mL/min   Anion gap 7 5 - 15  Brain natriuretic peptide  Result Value Ref Range   B Natriuretic Peptide 705.1 (H) 0.0 - 100.0 pg/mL  I-stat troponin, ED  Result Value Ref Range   Troponin i, poc 0.00 0.00 - 0.08 ng/mL   Comment 3          I-stat troponin, ED  Result Value Ref Range   Troponin i, poc 0.01 0.00 - 0.08 ng/mL   Comment 3           Dg Chest 2 View  Result Date: 02/08/2016 CLINICAL DATA:  Shortness of breath. EXAM: CHEST  2 VIEW COMPARISON:  None. FINDINGS: There is no edema or consolidation. There is cardiomegaly with pulmonary vascularity within normal limits. No adenopathy. No bone lesions. IMPRESSION: No edema or consolidation. There is cardiomegaly. A degree of underlying  pericardial effusion is questioned. Electronically Signed   By: Lowella Grip III M.D.   On: 02/08/2016 14:59      Domenic Moras, PA-C 02/08/16 1840    Duffy Bruce, MD 02/09/16 1145

## 2016-02-08 NOTE — ED Provider Notes (Signed)
[facility] DEPT Provider Note   CSN: [id number] Arrival date & time: 02/08/16  1421     History   Chief Complaint Chief Complaint  Patient presents with  . Shortness of Breath    HPI Jon Williams is a 65 y.o. male.  HPI   Patient is a 65 year old male with no pertinent past medical history presents the ED from [facility] medical clinic with complaint of EKG changes. Patient reports he was following up with his primary care provider this afternoon regarding a cold he has had for the past few weeks. He states while he was in the office he had an EKG performed which showed EKG changes resulting in him being sent to the ED for further evaluation. Patient reports having sinus pressure, nasal congestion, scratchy throat and an intermittent dry cough for the past few weeks. He states last night while he was laying in bed he began having shortness of breath which lasted a few hours and then resolved spontaneously. He also reports noticing mild swelling to his legs this morning. Denies hx of leg swelling. Patient currently denies any pain or complaints. Denies taking any medications for his symptoms today. Denies fever, chills, lightheadedness, dizziness, headache, hemoptysis, chest pain, wheezing, palpitations, abdominal pain, nausea, vomiting, numbness, tingling, weakness, diaphoresis. Patient denies personal or family history of cardiac disease. Denies smoking.  History reviewed. No pertinent past medical history.  There are no active problems to display for this patient.   History reviewed. No pertinent surgical history.     Home Medications    Prior to Admission medications   Medication Sig Start Date End Date Taking? Authorizing Provider  Cholecalciferol (VITAMIN D PO) Take by mouth daily.    Historical Provider, MD  HYDROcodone-acetaminophen (NORCO/VICODIN) 5-325 MG per tablet Take 2 tablets by mouth every 4 (four) hours as needed for moderate pain. 08/11/14   Orpah Greek, MD  meloxicam (MOBIC) 15 MG tablet  08/15/15   Historical Provider, MD  Multiple Vitamin (ONE-A-DAY MENS PO) Take by mouth daily.    Historical Provider, MD  Omega-3 Fatty Acids (OMEGA 3 PO) Take by mouth daily.    Historical Provider, MD    Family History No family history on file.  Social History Social History  Substance Use Topics  . Smoking status: Never Smoker  . Smokeless tobacco: Not on file  . Alcohol use No     Allergies   Aspirin and Sulfa antibiotics   Review of Systems Review of Systems  HENT: Positive for congestion, sinus pressure and sore throat.   Respiratory: Positive for cough and shortness of breath.   Cardiovascular: Positive for leg swelling.  All other systems reviewed and are negative.    Physical Exam Updated Vital Signs BP (!) 143/103 (BP Location: Right Arm)   Pulse 88   Temp 97.8 F (36.6 C) (Oral)   Resp 20   Ht 5\' 6"  (1.676 m)   Wt 89.4 kg   SpO2 100%   BMI 31.80 kg/m   Physical Exam  Constitutional: He is oriented to person, place, and time. He appears well-developed and well-nourished. No distress.  HENT:  Head: Normocephalic and atraumatic.  Mouth/Throat: Uvula is midline, oropharynx is clear and moist and mucous membranes are normal. No oropharyngeal exudate, posterior oropharyngeal edema, posterior oropharyngeal erythema or tonsillar abscesses. No tonsillar exudate.  Eyes: Conjunctivae and EOM are normal. Right eye exhibits no discharge. Left eye exhibits no discharge. No scleral icterus.  Neck: Normal range of motion. Neck  supple.  Cardiovascular: Normal rate, regular rhythm, normal heart sounds and intact distal pulses.   Pulmonary/Chest: Effort normal and breath sounds normal. No respiratory distress. He has no wheezes. He has no rales. He exhibits no tenderness.  Abdominal: Soft. Bowel sounds are normal. He exhibits no distension and no mass. There is no tenderness. There is no rebound and no guarding.    Musculoskeletal: Normal range of motion. He exhibits edema (1+ pitting edema noted to BLE up to mid shin). He exhibits no tenderness or deformity.  Lymphadenopathy:    He has no cervical adenopathy.  Neurological: He is alert and oriented to person, place, and time.  Skin: Skin is warm and dry. He is not diaphoretic.  Nursing note and vitals reviewed.    ED Treatments / Results  Labs (all labs ordered are listed, but only abnormal results are displayed) Labs Reviewed  [place], ED    EKG  EKG Interpretation None       Radiology No results found.  Procedures Procedures (including critical care time)  Medications Ordered in ED Medications - No data to display   Initial Impression / Assessment and Plan / ED Course  I have reviewed the triage vital signs and the nursing notes.  Pertinent labs & imaging results that were available during my care of the patient were reviewed by me and considered in my medical decision making (see chart for details).  Clinical Course    Patient presents from PCPs office with EKG changes. Patient reports initially being evaluated for cold he has had for the past few weeks. Denies having any chest pain. Reports episode of shortness of breath that lasted a few hours last night while he was laying in bed. Reports noticing mild swelling to his legs this morning. Patient currently denies any pain or complaints at this time. Denies personal or family history of cardiac disease. Vitals showed BP 143/103, otherwise unremarkable. Exam revealed 1+ edema to bilateral lower extremities extending to mid shin, lungs clear to auscultation bilaterally. Remaining exam unremarkable.  EKG showed LBBB and LVH, no prior tracing to compare. Labs and CXR pending. Discussed pt with Dr. Jeneen Rinks. Plan to order delta trop.   Hand-off to Domenic Moras, PA-C. If both trops negative, plan  to d/c pt home with outpatient cardiology follow up.  Final Clinical Impressions(s) / ED Diagnoses   Final diagnoses:  None    New Prescriptions New Prescriptions   No medications on file     Nona Dell, PA-C 02/08/16 [place], MD 02/16/16 [phone]

## 2016-02-08 NOTE — ED Notes (Signed)
Patient transported to X-ray 

## 2016-02-08 NOTE — Discharge Instructions (Signed)
Your shortness of breath may be a sign of heart failure.  Please call and follow up closely with your primary care provider and heart doctor for further evaluation and management of your condition.  Take Lasix to help remove extra fluid from your body, which will help with your breathing.  Return to the ER if you have any concerns.

## 2016-02-08 NOTE — ED Triage Notes (Signed)
Pt brought in by EMS from [facility] center for abnormal echo and ekg. Pt does endorse SOB x2 weeks. Pt denies chest pain. Pt a&ox4.

## 2016-02-14 DIAGNOSIS — R9431 Abnormal electrocardiogram [ECG] [EKG]: Secondary | ICD-10-CM | POA: Diagnosis not present

## 2016-02-14 DIAGNOSIS — R7989 Other specified abnormal findings of blood chemistry: Secondary | ICD-10-CM | POA: Diagnosis not present

## 2016-02-15 ENCOUNTER — Encounter (INDEPENDENT_AMBULATORY_CARE_PROVIDER_SITE_OTHER): Payer: Self-pay

## 2016-02-15 ENCOUNTER — Encounter: Payer: Self-pay | Admitting: Cardiology

## 2016-02-15 ENCOUNTER — Ambulatory Visit (INDEPENDENT_AMBULATORY_CARE_PROVIDER_SITE_OTHER): Payer: BLUE CROSS/BLUE SHIELD | Admitting: Cardiology

## 2016-02-15 VITALS — BP 130/80 | HR 88 | Ht 66.0 in | Wt 209.0 lb

## 2016-02-15 DIAGNOSIS — Z79899 Other long term (current) drug therapy: Secondary | ICD-10-CM

## 2016-02-15 DIAGNOSIS — I509 Heart failure, unspecified: Secondary | ICD-10-CM | POA: Diagnosis not present

## 2016-02-15 NOTE — Patient Instructions (Addendum)
Medication Instructions:    Your physician recommends that you continue on your current medications as directed. Please refer to the Current Medication list given to you today.  --- If you need a refill on your cardiac medications before your next appointment, please call your pharmacy. ---  Labwork:  Today: CMP, TSH and Magnesium level  Testing/Procedures: Your physician has requested that you have an echocardiogram this week. Echocardiography is a painless test that uses sound waves to create images of your heart. It provides your doctor with information about the size and shape of your heart and how well your heart's chambers and valves are working. This procedure takes approximately one hour. There are no restrictions for this procedure.  Follow-Up:  To be determined based on echocardiogram results.  Thank you for choosing [facility]!!

## 2016-02-15 NOTE — Progress Notes (Signed)
Cardiology Office Note   Date:  02/15/2016   ID:  Jon Williams, DOB 02-12-1951, MRN [id number]  PCP:  Kandice Hams, MD  Cardiologist:  NEW    Chief Complaint  Patient presents with  . Shortness of Breath    DOE, CHF seen in ER 02/08/16      History of Present Illness: Jon Williams is a 66 y.o. male who presents for New CHF BNP 705 he was in ER with BLE edema and increase SOB.  Troponins were negative. . He had cold sxs  2 weeks prior.  EKG with LBBB, SR with 1`st degree AV block. CXR No edema or consolidation. There is cardiomegaly. A degree of underlying pericardial effusion is questioned.  Pt given IV lasix and then placed on po lasix 20 mg daily.  Today pt is improved.  Lower ext edema has improved.  Still with DOE and at night he will need to get up and cough and then be able to go back to bed.  But DOE with just walking to BR and shaving.    He has been healthy all of his life, was a runner and likes to be out walking or being active.  There is a question that LBBB is new.  Will try and get old EKG from [facility].       Past Medical History:  Diagnosis Date  . CHF (congestive heart failure) ([place])     History reviewed. No pertinent surgical history.   Current Outpatient Prescriptions  Medication Sig Dispense Refill  . furosemide (LASIX) 20 MG tablet Take 1 tablet (20 mg total) by mouth daily. 30 tablet 0  . Multiple Vitamin (ONE-A-DAY MENS PO) Take by mouth daily.    . Omega-3 Fatty Acids (OMEGA 3 PO) Take by mouth daily.     No current facility-administered medications for this visit.     Allergies:   Aspirin and Sulfa antibiotics    Social History:  The patient  reports that he has never smoked. He has never used smokeless tobacco. He reports that he does not drink alcohol or use drugs.   Family History:  The patient's family history includes Cancer in his father; Diabetes in his father and mother; Healthy in his brother and sister; Heart  attack in his brother; Heart disease in his mother; Heart disease (age of onset: 57) in his brother; Hypertension in his father and mother.    ROS:  General:no colds or fevers, no weight changes Skin:no rashes or ulcers HEENT:no blurred vision, no congestion CV:see HPI PUL:see HPI GI:no diarrhea constipation or melena, no indigestion GU:no hematuria, no dysuria MS:no joint pain, no claudication Neuro:no syncope, no lightheadedness Endo:no diabetes, no thyroid disease  Wt Readings from Last 3 Encounters:  02/15/16 209 lb (94.8 kg)  02/08/16 197 lb (89.4 kg)     PHYSICAL EXAM: VS:  BP 130/80 (BP Location: Left Arm, Patient Position: Sitting, Cuff Size: Normal)   Pulse 88   Ht 5\' 6"  (1.676 m)   Wt 209 lb (94.8 kg)   BMI 33.73 kg/m  , BMI Body mass index is 33.73 kg/m. General:Pleasant affect, NAD Skin:Warm and dry, brisk capillary refill HEENT:normocephalic, sclera clear, mucus membranes moist Neck:supple, + JVD, no bruits  Heart:S1S2 RRR without murmur, + gallup, rub or click Lungs:clear without rales, rhonchi, or wheezes [id number], non tender, + BS, do not palpate liver spleen or masses Ext:no lower ext edema, 2+ pedal pulses, 2+ radial pulses Neuro:alert and oriented X 3,  MAE, follows commands, + facial symmetry    EKG:  EKG is NOT ordered today. The ekg from ER demonstrates SR with 1st degree AV block    Recent Labs: 02/08/2016: B Natriuretic Peptide 705.1; BUN 13; Creatinine, Ser 0.91; Hemoglobin 14.2; Platelets 194; Potassium 3.9; Sodium 137    Lipid Panel No results found for: CHOL, TRIG, HDL, CHOLHDL, VLDL, LDLCALC, LDLDIRECT     Other studies Reviewed: Additional studies/ records that were reviewed today include: XR as above, will obtain old EKG   ASSESSMENT AND PLAN:  1.  CHF acute, improved with lasix, will check ECHO this week - will wait to decide on ischemic workup depending on Echo and old EKG.  Will recheck labs CMP,.TSH and Mg.  Will hold off  ACE/BB depending on echo.  Would need to monitor BB with LBBB and 1st degree AV block.  Discussed with Dr. Lovena Le DOD  2. Cardiomegaly, per CXR see above.   Pt will follow up depending on Echo results.  May need cardiac cath which I discussed with pt.   He have no Old EKGs to compare his LBBB to.    Current medicines are reviewed with the patient today.  The patient Has no concerns regarding medicines.  The following changes have been made:  See above Labs/ tests ordered today include:see above  Disposition:   FU:  see above  Signed, Cecilie Kicks, NP  02/15/2016 3:30 PM    [address], [place], [place], [place] Phone: [phone]; Fax: [phone]

## 2016-02-16 ENCOUNTER — Encounter: Payer: Self-pay | Admitting: Cardiology

## 2016-02-16 ENCOUNTER — Ambulatory Visit (INDEPENDENT_AMBULATORY_CARE_PROVIDER_SITE_OTHER): Payer: BLUE CROSS/BLUE SHIELD | Admitting: Cardiology

## 2016-02-16 ENCOUNTER — Ambulatory Visit (HOSPITAL_COMMUNITY)
Admission: RE | Admit: 2016-02-16 | Discharge: 2016-02-16 | Disposition: A | Discharge status: Home / Self Care | Payer: BLUE CROSS/BLUE SHIELD | Source: Ambulatory Visit | Attending: Cardiology | Admitting: Cardiology

## 2016-02-16 VITALS — BP 144/88 | HR 61 | Ht 66.0 in | Wt 209.8 lb

## 2016-02-16 DIAGNOSIS — I420 Dilated cardiomyopathy: Secondary | ICD-10-CM

## 2016-02-16 DIAGNOSIS — I081 Rheumatic disorders of both mitral and tricuspid valves: Secondary | ICD-10-CM | POA: Diagnosis not present

## 2016-02-16 DIAGNOSIS — I509 Heart failure, unspecified: Secondary | ICD-10-CM | POA: Insufficient documentation

## 2016-02-16 LAB — COMPREHENSIVE METABOLIC PANEL
ALT: 71 IU/L — ABNORMAL HIGH (ref 0–44)
AST: 33 IU/L (ref 0–40)
Albumin/Globulin Ratio: 1.8 (ref 1.2–2.2)
Albumin: 4.4 g/dL (ref 3.6–4.8)
Alkaline Phosphatase: 53 IU/L (ref 39–117)
BUN/Creatinine Ratio: 15 (ref 10–24)
BUN: 15 mg/dL (ref 8–27)
Bilirubin Total: 0.8 mg/dL (ref 0.0–1.2)
CO2: 24 mmol/L (ref 18–29)
Calcium: 9.5 mg/dL (ref 8.6–10.2)
Chloride: 104 mmol/L (ref 96–106)
Creatinine, Ser: 0.99 mg/dL (ref 0.76–1.27)
GFR calc Af Amer: 91 mL/min/{1.73_m2} (ref >59)
GFR calc non Af Amer: 78 mL/min/{1.73_m2} (ref >59)
Globulin, Total: 2.4 g/dL (ref 1.5–4.5)
Glucose: 95 mg/dL (ref 65–99)
Potassium: 4.3 mmol/L (ref 3.5–5.2)
Sodium: 145 mmol/L — ABNORMAL HIGH (ref 134–144)
Total Protein: 6.8 g/dL (ref 6.0–8.5)

## 2016-02-16 LAB — MAGNESIUM: Magnesium: 1.9 mg/dL (ref 1.6–2.3)

## 2016-02-16 LAB — TSH: TSH: 1.77 u[IU]/mL (ref 0.450–4.500)

## 2016-02-16 MED ORDER — CARVEDILOL 3.125 MG PO TABS: 3.1250 mg | ORAL_TABLET | Freq: Two times a day (BID) | ORAL | 3 refills | Status: DC

## 2016-02-16 MED ORDER — LOSARTAN POTASSIUM 50 MG PO TABS: 50.0000 mg | ORAL_TABLET | Freq: Every day | ORAL | 3 refills | Status: DC

## 2016-02-16 MED ORDER — FUROSEMIDE 40 MG PO TABS: 40.0000 mg | ORAL_TABLET | Freq: Every day | ORAL | 3 refills | Status: DC

## 2016-02-16 MED ORDER — POTASSIUM CHLORIDE ER 10 MEQ PO TBCR: 10.0000 meq | EXTENDED_RELEASE_TABLET | Freq: Every day | ORAL | 3 refills | Status: DC

## 2016-02-16 NOTE — Patient Instructions (Addendum)
Medication Instructions:    Your physician has recommended you make the following change in your medication:  1) INCREASE Lasix to 40 mg daily 2) START Potassium 10 mEq daily 3) START Losartan 50 mg daily 4) On 02/20/16 - start Carvedilol 3.125 mg twice a day  --- If you need a refill on your cardiac medications before your next appointment, please call your pharmacy. ---  Labwork:  None ordered  Testing/Procedures:  None ordered  Follow-Up:  You are scheduled to see Dr. [name] on 02/21/16 @ 9:00 am.  Please arrive by 8:45 a.m.  Thank you for choosing [facility]!!     Any Other Special Instructions Will Be Listed Below (If Applicable).   Weigh your self every morning (before breakfast).  Call the office if:  1) gain 3 or more pounds in 1 day  2) gain 5 or more pounds in 1 week   Low-Sodium Eating Plan Sodium raises blood pressure and causes water to be held in the body. Getting less sodium from food will help lower your blood pressure, reduce any swelling, and protect your heart, liver, and kidneys. We get sodium by adding salt (sodium chloride) to food. Most of our sodium comes from canned, boxed, and frozen foods. Restaurant foods, fast foods, and pizza are also very high in sodium. Even if you take medicine to lower your blood pressure or to reduce fluid in your body, getting less sodium from your food is important. What is my plan? Most people should limit their sodium intake to 2,300 mg a day. Your health care provider recommends that you limit your sodium intake to __________ a day. What do I need to know about this eating plan? For the low-sodium eating plan, you will follow these general guidelines:  Choose foods with a % Daily Value for sodium of less than 5% (as listed on the food label).  Use salt-free seasonings or herbs instead of table salt or sea salt.  Check with your health care provider or pharmacist before using salt substitutes.  Eat fresh  foods.  Eat more vegetables and fruits.  Limit canned vegetables. If you do use them, rinse them well to decrease the sodium.  Limit cheese to 1 oz (28 g) per day.  Eat lower-sodium products, often labeled as "lower sodium" or "no salt added."  Avoid foods that contain monosodium glutamate (MSG). MSG is sometimes added to [place] food and some canned foods.  Check food labels (Nutrition Facts labels) on foods to learn how much sodium is in one serving.  Eat more home-cooked food and less restaurant, buffet, and fast food.  When eating at a restaurant, ask that your food be prepared with less salt, or no salt if possible. How do I read food labels for sodium information? The Nutrition Facts label lists the amount of sodium in one serving of the food. If you eat more than one serving, you must multiply the listed amount of sodium by the number of servings. Food labels may also identify foods as:  Sodium free-Less than 5 mg in a serving.  Very low sodium-35 mg or less in a serving.  Low sodium-140 mg or less in a serving.  Light in sodium-50% less sodium in a serving. For example, if a food that usually has 300 mg of sodium is changed to become light in sodium, it will have 150 mg of sodium.  Reduced sodium-25% less sodium in a serving. For example, if a food that usually has 400 mg  of sodium is changed to reduced sodium, it will have 300 mg of sodium. What foods can I eat? Grains  Low-sodium cereals, including oats, puffed wheat and rice, and shredded wheat cereals. Low-sodium crackers. Unsalted rice and pasta. Lower-sodium bread. Vegetables  Frozen or fresh vegetables. Low-sodium or reduced-sodium canned vegetables. Low-sodium or reduced-sodium tomato sauce and paste. Low-sodium or reduced-sodium tomato and vegetable juices. Fruits  Fresh, frozen, and canned fruit. Fruit juice. Meat and Other Protein Products  Low-sodium canned tuna and salmon. Fresh or frozen meat, poultry,  seafood, and fish. Lamb. Unsalted nuts. Dried beans, peas, and lentils without added salt. Unsalted canned beans. Homemade soups without salt. Eggs. Dairy  Milk. Soy milk. Ricotta cheese. Low-sodium or reduced-sodium cheeses. Yogurt. Condiments  Fresh and dried herbs and spices. Salt-free seasonings. Onion and garlic powders. Low-sodium varieties of mustard and ketchup. Fresh or refrigerated horseradish. Lemon juice. Fats and Oils  Reduced-sodium salad dressings. Unsalted butter. Other  Unsalted popcorn and pretzels. The items listed above may not be a complete list of recommended foods or beverages. Contact your dietitian for more options.  What foods are not recommended? Grains  Instant hot cereals. Bread stuffing, pancake, and biscuit mixes. Croutons. Seasoned rice or pasta mixes. Noodle soup cups. Boxed or frozen macaroni and cheese. Self-rising flour. Regular salted crackers. Vegetables  Regular canned vegetables. Regular canned tomato sauce and paste. Regular tomato and vegetable juices. Frozen vegetables in sauces. Salted [place] fries. Olives. Angie Fava. Relishes. Sauerkraut. Salsa. Meat and Other Protein Products  Salted, canned, smoked, spiced, or pickled meats, seafood, or fish. Bacon, ham, sausage, hot dogs, corned beef, chipped beef, and packaged luncheon meats. Salt pork. Jerky. Pickled herring. Anchovies, regular canned tuna, and sardines. Salted nuts. Dairy  Processed cheese and cheese spreads. Cheese curds. Blue cheese and cottage cheese. Buttermilk. Condiments  Onion and garlic salt, seasoned salt, table salt, and sea salt. Canned and packaged gravies. Worcestershire sauce. Tartar sauce. Barbecue sauce. Teriyaki sauce. Soy sauce, including reduced sodium. Steak sauce. Fish sauce. Oyster sauce. Cocktail sauce. Horseradish that you find on the shelf. Regular ketchup and mustard. Meat flavorings and tenderizers. Bouillon cubes. Hot sauce. Tabasco sauce. Marinades. Taco seasonings.  Relishes. Fats and Oils  Regular salad dressings. Salted butter. Margarine. Ghee. Bacon fat. Other  Potato and tortilla chips. Corn chips and puffs. Salted popcorn and pretzels. Canned or dried soups. Pizza. Frozen entrees and pot pies. The items listed above may not be a complete list of foods and beverages to avoid. Contact your dietitian for more information.  This information is not intended to replace advice given to you by your health care provider. Make sure you discuss any questions you have with your health care provider. Document Released: 07/21/2001 Document Revised: 07/07/2015 Document Reviewed: 12/03/2012 Elsevier Interactive Patient Education  2017 [place].   Heart-Healthy Eating Plan Many factors influence your heart health, including eating and exercise habits. Heart (coronary) risk increases with abnormal blood fat (lipid) levels. Heart-healthy meal planning includes limiting unhealthy fats, increasing healthy fats, and making other small dietary changes. This includes maintaining a healthy body weight to help keep lipid levels within a normal range. What is my plan? Your health care provider recommends that you:  Get no more than _________% of the total calories in your daily diet from fat.  Limit your intake of saturated fat to less than _________% of your total calories each day.  Limit the amount of cholesterol in your diet to less than _________ mg per day. What types  of fat should I choose?  Choose healthy fats more often. Choose monounsaturated and polyunsaturated fats, such as olive oil and canola oil, flaxseeds, walnuts, almonds, and seeds.  Eat more omega-3 fats. Good choices include salmon, mackerel, sardines, tuna, flaxseed oil, and ground flaxseeds. Aim to eat fish at least two times each week.  Limit saturated fats. Saturated fats are primarily found in animal products, such as meats, butter, and cream. Plant sources of saturated fats include palm oil,  palm kernel oil, and coconut oil.  Avoid foods with partially hydrogenated oils in them. These contain trans fats. Examples of foods that contain trans fats are stick margarine, some tub margarines, cookies, crackers, and other baked goods. What general guidelines do I need to follow?  Check food labels carefully to identify foods with trans fats or high amounts of saturated fat.  Fill one half of your plate with vegetables and green salads. Eat 4-5 servings of vegetables per day. A serving of vegetables equals 1 cup of raw leafy vegetables,  cup of raw or cooked cut-up vegetables, or  cup of vegetable juice.  Fill one fourth of your plate with whole grains. Look for the word "whole" as the first word in the ingredient list.  Fill one fourth of your plate with lean protein foods.  Eat 4-5 servings of fruit per day. A serving of fruit equals one medium whole fruit,  cup of dried fruit,  cup of fresh, frozen, or canned fruit, or  cup of 100% fruit juice.  Eat more foods that contain soluble fiber. Examples of foods that contain this type of fiber are apples, broccoli, carrots, beans, peas, and barley. Aim to get 20-30 g of fiber per day.  Eat more home-cooked food and less restaurant, buffet, and fast food.  Limit or avoid alcohol.  Limit foods that are high in starch and sugar.  Avoid fried foods.  Cook foods by using methods other than frying. Baking, boiling, grilling, and broiling are all great options. Other fat-reducing suggestions include:  Removing the skin from poultry.  Removing all visible fats from meats.  Skimming the fat off of stews, soups, and gravies before serving them.  Steaming vegetables in water or broth.  Lose weight if you are overweight. Losing just 5-10% of your initial body weight can help your overall health and prevent diseases such as diabetes and heart disease.  Increase your consumption of nuts, legumes, and seeds to 4-5 servings per week. One  serving of dried beans or legumes equals  cup after being cooked, one serving of nuts equals 1 ounces, and one serving of seeds equals  ounce or 1 tablespoon.  You may need to monitor your salt (sodium) intake, especially if you have high blood pressure. Talk with your health care provider or dietitian to get more information about reducing sodium. What foods can I eat? Grains  Breads, including [place], white, pita, wheat, raisin, rye, oatmeal, and [place]. Tortillas that are neither fried nor made with lard or trans fat. Low-fat rolls, including hotdog and hamburger buns and English muffins. Biscuits. Muffins. Waffles. Pancakes. Light popcorn. Whole-grain cereals. Flatbread. Melba toast. Pretzels. Breadsticks. Rusks. Low-fat snacks and crackers, including oyster, saltine, matzo, graham, animal, and rye. Rice and pasta, including brown rice and those that are made with whole wheat. Vegetables  All vegetables. Fruits  All fruits, but limit coconut. Meats and Other Protein Sources  Lean, well-trimmed beef, veal, pork, and lamb. Chicken and [place] without skin. All fish and shellfish.  Wild duck, rabbit, pheasant, and venison. Egg whites or low-cholesterol egg substitutes. Dried beans, peas, lentils, and tofu.Seeds and most nuts. Dairy  Low-fat or nonfat cheeses, including ricotta, string, and mozzarella. Skim or 1% milk that is liquid, powdered, or evaporated. Buttermilk that is made with low-fat milk. Nonfat or low-fat yogurt. Beverages  Mineral water. Diet carbonated beverages. Sweets and Desserts  Sherbets and fruit ices. Honey, jam, marmalade, jelly, and syrups. Meringues and gelatins. Pure sugar candy, such as hard candy, jelly beans, gumdrops, mints, marshmallows, and small amounts of dark chocolate. [employer] Eat all sweets and desserts in moderation. Fats and Oils  Nonhydrogenated (trans-free) margarines. Vegetable oils, including soybean, sesame, sunflower, olive, peanut,  safflower, corn, canola, and cottonseed. Salad dressings or mayonnaise that are made with a vegetable oil. Limit added fats and oils that you use for cooking, baking, salads, and as spreads. Other  Cocoa powder. Coffee and tea. All seasonings and condiments. The items listed above may not be a complete list of recommended foods or beverages. Contact your dietitian for more options.  What foods are not recommended? Grains  Breads that are made with saturated or trans fats, oils, or whole milk. Croissants. Butter rolls. Cheese breads. Sweet rolls. Donuts. Buttered popcorn. Chow mein noodles. High-fat crackers, such as cheese or butter crackers. Meats and Other Protein Sources  Fatty meats, such as hotdogs, short ribs, sausage, spareribs, bacon, ribeye roast or steak, and mutton. High-fat deli meats, such as salami and bologna. Caviar. Domestic duck and goose. Organ meats, such as kidney, liver, sweetbreads, brains, gizzard, chitterlings, and heart. Dairy  Cream, sour cream, cream cheese, and creamed cottage cheese. Whole milk cheeses, including blue (bleu), [place], [place], [place], American, [place], [place], [place], [place], and [place]. Whole or 2% milk that is liquid, evaporated, or condensed. Whole buttermilk. Cream sauce or high-fat cheese sauce. Yogurt that is made from whole milk. Beverages  Regular sodas and drinks with added sugar. Sweets and Desserts  Frosting. Pudding. Cookies. Cakes other than angel food cake. Candy that has milk chocolate or white chocolate, hydrogenated fat, butter, coconut, or unknown ingredients. Buttered syrups. Full-fat ice cream or ice cream drinks. Fats and Oils  Gravy that has suet, meat fat, or shortening. Cocoa butter, hydrogenated oils, palm oil, coconut oil, palm kernel oil. These can often be found in baked products, candy, fried foods, nondairy creamers, and whipped toppings. Solid fats and shortenings, including bacon fat, salt pork, lard, and butter.  Nondairy cream substitutes, such as coffee creamers and sour cream substitutes. Salad dressings that are made of unknown oils, cheese, or sour cream. The items listed above may not be a complete list of foods and beverages to avoid. Contact your dietitian for more information.  This information is not intended to replace advice given to you by your health care provider. Make sure you discuss any questions you have with your health care provider. Document Released: 11/08/2007 Document Revised: 08/19/2015 Document Reviewed: 07/23/2013 Elsevier Interactive Patient Education  2017 [employer].

## 2016-02-16 NOTE — Progress Notes (Signed)
  Echocardiogram 2D Echocardiogram has been performed.  Jon Williams 02/16/2016, 8:50 AM

## 2016-02-16 NOTE — Progress Notes (Signed)
Cardiology Office Note   Date:  02/16/2016   ID:  Jon Williams, DOB December 26, 1950, MRN [id number]  PCP:  Kandice Hams, MD  Cardiologist:  Dr. [name]     Chief Complaint  Patient presents with  . Shortness of Breath    pt states a little when lying down at night, it doesn't always happen just off and on       History of Present Illness: Jon Williams is a 66 y.o. male who presents for cardiomyopathy.  Echo today with EF 15-20%  We called and asked pt to return today to begin meds and diuresis.  Last evening he had more SOB.   Dr. [name] and I discussed cardiomyopathy with pt.  And treatment plan.      Past Medical History:  Diagnosis Date  . CHF (congestive heart failure) (HCC)     No past surgical history on file.   Current Outpatient Prescriptions  Medication Sig Dispense Refill  . Multiple Vitamin (ONE-A-DAY MENS PO) Take by mouth daily.    . Omega-3 Fatty Acids (OMEGA 3 PO) Take by mouth daily.    . carvedilol (COREG) 3.125 MG tablet Take 1 tablet (3.125 mg total) by mouth 2 (two) times daily. 60 tablet 3  . furosemide (LASIX) 40 MG tablet Take 1 tablet (40 mg total) by mouth daily. 90 tablet 3  . losartan (COZAAR) 50 MG tablet Take 1 tablet (50 mg total) by mouth daily. 30 tablet 3  . potassium chloride (K-DUR) 10 MEQ tablet Take 1 tablet (10 mEq total) by mouth daily. 30 tablet 3   No current facility-administered medications for this visit.     Allergies:   Aspirin and Sulfa antibiotics    Social History:  The patient  reports that he has never smoked. He has never used smokeless tobacco. He reports that he does not drink alcohol or use drugs.   Family History:  The patient's family history includes Cancer in his father; Diabetes in his father and mother; Healthy in his brother and sister; Heart attack in his brother; Heart disease in his mother; Heart disease (age of onset: 74) in his brother; Hypertension in his father and mother.    ROS:  General:no  colds or fevers, no weight changes Skin:no rashes or ulcers HEENT:no blurred vision, no congestion CV:see HPI PUL:see HPI GI:no diarrhea constipation or melena, no indigestion GU:no hematuria, no dysuria MS:no joint pain, no claudication Neuro:no syncope, no lightheadedness Endo:no diabetes, no thyroid disease  Wt Readings from Last 3 Encounters:  02/16/16 209 lb 12.8 oz (95.2 kg)  02/15/16 209 lb (94.8 kg)  02/08/16 197 lb (89.4 kg)     PHYSICAL EXAM: VS:  BP (!) 144/88   Pulse 61   Ht 5\' 6"  (1.676 m)   Wt 209 lb 12.8 oz (95.2 kg)   SpO2 99%   BMI 33.86 kg/m  , BMI Body mass index is 33.86 kg/m. General:Pleasant affect, NAD Skin:Warm and dry, brisk capillary refill HEENT:normocephalic, sclera clear, mucus membranes moist Neck:supple, + JVD, no bruits  Heart:S1S2 RRR without murmur, gallup, rub or click Lungs:clear without rales, rhonchi, or wheezes [id number], non tender, + BS, do not palpate liver spleen or masses Ext:1+ lower ext edema, 2+ pedal pulses, 2+ radial pulses Neuro:alert and oriented x 3, MAE, follows commands, + facial symmetry    EKG:  EKG is NOT ordered today.    Recent Labs: 02/08/2016: B Natriuretic Peptide 705.1; Hemoglobin 14.2; Platelets 194 02/15/2016: ALT 71; BUN  15; Creatinine, Ser 0.99; Magnesium 1.9; Potassium 4.3; Sodium 145; TSH 1.770    Lipid Panel No results found for: CHOL, TRIG, HDL, CHOLHDL, VLDL, LDLCALC, LDLDIRECT     Other studies Reviewed: Additional studies/ records that were reviewed today include:   Echo:. Study Conclusions  - Left ventricle: The cavity size was moderately dilated. There was   mild concentric hypertrophy. Systolic function was severely   reduced. The estimated ejection fraction was in the range of 15%   to 20%. Severe diffuse hypokinesis. Akinesis of the   basal-midinferior and inferoseptal myocardium. Doppler parameters   are consistent with a reversible restrictive pattern, indicative   of  decreased left ventricular diastolic compliance and/or   increased left atrial pressure (grade 3 diastolic dysfunction).   Doppler parameters are consistent with high ventricular filling   pressure. - Aortic valve: Transvalvular velocity was within the normal range.   There was no stenosis. - Mitral valve: Transvalvular velocity was within the normal range.   There was no evidence for stenosis. There was mild to moderate   regurgitation. - Left atrium: The atrium was severely dilated. - Right ventricle: The cavity size was normal. Wall thickness was   normal. Systolic function was moderately reduced. - Right atrium: The atrium was moderately dilated. - Tricuspid valve: There was mild regurgitation. - Pulmonary arteries: Systolic pressure was moderately increased.   PA peak pressure: 50 mm Hg (S).   ASSESSMENT AND PLAN:  1.  Acute systolic HF  Increase lasix  2. Cardiomyopathy with Ef 15-20%  Add losartan 50 mg daily.  Plan will be to add coreg 3.125 BID.  He will see Dr. [name] next week and lab work.  When edema improved plan will be for cardiac cath.   Discussed low salt diet, weigh daily and call for wt gain.     Current medicines are reviewed with the patient today.  The patient Has no concerns regarding medicines.  The following changes have been made:  See above Labs/ tests ordered today include:see above  Disposition:   FU:  see above  Signed, Cecilie Kicks, NP  02/16/2016 3:47 PM    [address], [place], [place], [place] Phone: [phone]; Fax: [phone]

## 2016-02-21 ENCOUNTER — Ambulatory Visit: Payer: Medicare HMO | Admitting: Cardiovascular Disease

## 2016-02-22 ENCOUNTER — Encounter: Payer: Self-pay | Admitting: Podiatry

## 2016-02-22 ENCOUNTER — Ambulatory Visit (INDEPENDENT_AMBULATORY_CARE_PROVIDER_SITE_OTHER): Payer: BLUE CROSS/BLUE SHIELD | Admitting: Podiatry

## 2016-02-22 VITALS — Ht 66.0 in | Wt 209.0 lb

## 2016-02-22 DIAGNOSIS — L84 Corns and callosities: Secondary | ICD-10-CM

## 2016-02-22 DIAGNOSIS — M79606 Pain in leg, unspecified: Secondary | ICD-10-CM

## 2016-02-22 DIAGNOSIS — B351 Tinea unguium: Secondary | ICD-10-CM | POA: Diagnosis not present

## 2016-02-22 NOTE — Progress Notes (Signed)
Patient ID: Jon Williams, male   DOB: 11/14/1950, 65 y.o.   MRN: [id number] Complaint:  Visit Type: Patient returns to my office for continued preventative foot care services. Complaint: Patient states" my nails have grown long and thick and become painful to walk and wear shoes" . The patient presents for preventative foot care services. No changes to ROS.  He states he develops severe callus between his first and second toes but he has been working in it and it has improved..  Podiatric Exam: Vascular: dorsalis pedis and posterior tibial pulses are palpable bilateral. Capillary return is immediate. Temperature gradient is WNL. Skin turgor WNL  Sensorium: Normal Semmes Weinstein monofilament test. Normal tactile sensation bilaterally. Nail Exam: Pt has thick disfigured discolored nails with subungual debris noted bilateral entire nail hallux through fifth toenails Ulcer Exam: There is no evidence of ulcer or pre-ulcerative changes or infection. Orthopedic Exam: Muscle tone and strength are WNL. No limitations in general ROM. No crepitus or effusions noted. Foot type and digits show no abnormalities. Bony prominences are unremarkable. 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 Skin: No Porokeratosis. No infection or ulcer.   callus noted 1/2  B/L.  Diagnosis:  Onychomycosis, , Pain in right toe, pain in left toes  Callus 1/2  B/L   Tashiya Souders DPM Treatment & Plan Procedures and Treatment: Consent by patient was obtained for treatment procedures. The patient understood the discussion of treatment and procedures well. All questions were answered thoroughly reviewed. Debridement of mycotic and hypertrophic toenails, 1 through 5 bilateral and clearing of subungual debris. No ulceration, no infection noted.  Debride corn/ulcer right first interspace. Return Visit-Office Procedure: Patient instructed to return to the office for a follow up visit 9 weeks. for continued evaluation and  treatment.   Momodou Consiglio DPM 

## 2016-02-24 ENCOUNTER — Ambulatory Visit (INDEPENDENT_AMBULATORY_CARE_PROVIDER_SITE_OTHER): Payer: BLUE CROSS/BLUE SHIELD | Admitting: Physician Assistant

## 2016-02-24 ENCOUNTER — Encounter: Payer: Self-pay | Admitting: *Deleted

## 2016-02-24 ENCOUNTER — Encounter: Payer: Self-pay | Admitting: Physician Assistant

## 2016-02-24 DIAGNOSIS — I428 Other cardiomyopathies: Secondary | ICD-10-CM | POA: Insufficient documentation

## 2016-02-24 DIAGNOSIS — I420 Dilated cardiomyopathy: Secondary | ICD-10-CM

## 2016-02-24 DIAGNOSIS — I447 Left bundle-branch block, unspecified: Secondary | ICD-10-CM

## 2016-02-24 DIAGNOSIS — I5042 Chronic combined systolic (congestive) and diastolic (congestive) heart failure: Secondary | ICD-10-CM | POA: Diagnosis not present

## 2016-02-24 DIAGNOSIS — I5043 Acute on chronic combined systolic (congestive) and diastolic (congestive) heart failure: Secondary | ICD-10-CM | POA: Insufficient documentation

## 2016-02-24 NOTE — Progress Notes (Signed)
Cardiology Office Note:    Date:  02/24/2016   ID:  Jon Williams, DOB 19-Jan-1951, MRN [id number]  PCP:  Kandice Hams, MD  Cardiologist:  Dr. Liam Rogers   Electrophysiologist:  n/a  Referring MD: [name], MD   Chief Complaint  Patient presents with  . Follow-up    CHF    History of Present Illness:    Jon Williams is a 66 y.o. male with a recent dx of systolic CHF.  He was recently dx with congestive heart failure after a visit to the ED in late December.  He saw Cecilie Kicks, NP several days later and an echocardiogram demonstrated an EF of 15-20% with inferior and inferoseptal wall motion abnormalities, severe diastolic dysfunction, mod reduced RV function and mod pulmonary HTN.  He was last seen 02/16/16.  The plan discussed is to treat his congestive heart failure further and then arrange a cardiac cath.  He was placed on a beta-blocker, angiotensin receptor blocker, furosemide and potassium.  He returns for close follow up. Since last seen, he has been doing well. His breathing is improved.  His LE edema is resolved.  He denies chest pain, orthopnea, PND.  He went walking this AM without difficulty.   Prior CV studies that were reviewed today include:    Echo 02/16/16 Mild conc LVH, EF 15-20, severe diff HK, inf and inf-septal AK, Gr 3 DD, mild to mod MR, severe LAE, mod reduced RVSF, mod RAE, mild TR, PASP 50  Past Medical History:  Diagnosis Date  . Chronic combined systolic and diastolic CHF (congestive heart failure) ([place])    Echo 1/18: Mild conc LVH, EF 15-20, severe diff HK, inf and inf-septal AK, Gr 3 DD, mild to mod MR, severe LAE, mod reduced RVSF, mod RAE, mild TR, PASP 50  . DCM (dilated cardiomyopathy) ([place])   . LBBB (left bundle branch block)     History reviewed. No pertinent surgical history.  Current Medications: Current Meds  Medication Sig  . carvedilol (COREG) 3.125 MG tablet Take 1 tablet (3.125 mg total) by mouth 2 (two) times daily.  .  cholecalciferol (VITAMIN D) 1000 units tablet Take 1,000 Units by mouth daily.  . furosemide (LASIX) 40 MG tablet Take 1 tablet (40 mg total) by mouth daily.  Marland Kitchen losartan (COZAAR) 50 MG tablet Take 1 tablet (50 mg total) by mouth daily.  . Multiple Vitamin (ONE-A-DAY MENS PO) Take 1 tablet by mouth daily.   . Omega-3 Fatty Acids (OMEGA 3 PO) Take 2 tablets by mouth daily.   . potassium chloride (K-DUR) 10 MEQ tablet Take 1 tablet (10 mEq total) by mouth daily.     Allergies:   Aspirin and Sulfa antibiotics   Social History   Social History  . Marital status: Married    Spouse name: N/A  . Number of children: N/A  . Years of education: N/A   Social History Main Topics  . Smoking status: Never Smoker  . Smokeless tobacco: Never Used  . Alcohol use No  . Drug use: No  . Sexual activity: Not Asked   Other Topics Concern  . None   Social History Narrative  . None     Family History:  The patient's family history includes Cancer in his father; Diabetes in his father and mother; Healthy in his brother and sister; Heart attack in his brother; Heart disease in his mother; Heart disease (age of onset: 20) in his brother; Hypertension in his father and  mother.   ROS:   Please see the history of present illness.    ROS All other systems reviewed and are negative.   EKGs/Labs/Other Test Reviewed:    EKG:  EKG is  ordered today.  The ekg ordered today demonstrates NSR, HR 68, 1st degree AVB (PR 248), LBBB, PACs, similar to prior tracings.  Recent Labs: 02/08/2016: B Natriuretic Peptide 705.1; Hemoglobin 14.2; Platelets 194 02/15/2016: ALT 71; BUN 15; Creatinine, Ser 0.99; Magnesium 1.9; Potassium 4.3; Sodium 145; TSH 1.770   Recent Lipid Panel No results found for: CHOL, TRIG, HDL, CHOLHDL, VLDL, LDLCALC, LDLDIRECT   Physical Exam:    VS:  BP 120/62   Pulse 76   Ht 5\' 6"  (1.676 m)   Wt 200 lb 12.8 oz (91.1 kg)   BMI 32.41 kg/m     Wt Readings from Last 3 Encounters:    02/24/16 200 lb 12.8 oz (91.1 kg)  02/22/16 209 lb (94.8 kg)  02/16/16 209 lb 12.8 oz (95.2 kg)     Physical Exam  Constitutional: He is oriented to person, place, and time. He appears well-developed and well-nourished. No distress.  HENT:  Head: Normocephalic and atraumatic.  Eyes: No scleral icterus.  Neck: No JVD present.  Cardiovascular: Normal rate, regular rhythm and normal heart sounds.   No murmur heard. Pulses:      Femoral pulses are 2+ on the right side, and 2+ on the left side. No FA bruit bilaterally  Pulmonary/Chest: Effort normal. He has no wheezes. He has no rales.  Abdominal: Soft. There is no tenderness.  Musculoskeletal: He exhibits no edema.  Neurological: He is alert and oriented to person, place, and time.  Skin: Skin is warm and dry.  Psychiatric: He has a normal mood and affect.    ASSESSMENT:    1. Chronic combined systolic and diastolic CHF (congestive heart failure) ([place])   2. DCM (dilated cardiomyopathy) ([place])   3. LBBB (left bundle branch block)    PLAN:    In order of problems listed above:  1. Chronic combined systolic and diastolic CHF - NYHA 2.  His volume is improved and he appears well compensated.  He will need R and L heart cath to better define his cardiomyopathy.  Risks and benefits of cardiac catheterization have been discussed with the patient.  These include bleeding, infection, kidney damage, stroke, heart attack, death.  The patient understands these risks and is willing to proceed.   -  Schedule R and L heart catheterization next week  -  Continue Coreg, Losartan, Lasix.  -  BMET, CBC, INR today.  2. Dilated CM - He has inf wall motion abnormalities on Echo raising concern for an ischemic CM.  Arrange cardiac cath as noted above.   He is allergic to ASA.  He will need Plavix started if his LHC demonstrates CAD.   Start statin if + CAD as well. If EF remains < 35% after optimal therapy for CHF, he will need referral to EP for ICD  +/- CRT.    3. LBBB - ECG unchanged.     Medication Adjustments/Labs and Tests Ordered: Current medicines are reviewed at length with the patient today.  Concerns regarding medicines are outlined above.  Medication changes, Labs and Tests ordered today are outlined in the Patient Instructions noted below. Patient Instructions  Medication Instructions:  Your physician recommends that you continue on your current medications as directed. Please refer to the Current Medication list given to you today.  Labwork: TODAY BMET, CBC, PT/INR  Testing/Procedures: Your physician has requested that you have a cardiac catheterization. Cardiac catheterization is used to diagnose and/or treat various heart conditions. Doctors may recommend this procedure for a number of different reasons. The most common reason is to evaluate chest pain. Chest pain can be a symptom of coronary artery disease (CAD), and cardiac catheterization can show whether plaque is narrowing or blocking your heart's arteries. This procedure is also used to evaluate the valves, as well as measure the blood flow and oxygen levels in different parts of your heart. For further information please visit HugeFiesta.tn. Please follow instruction sheet, as given.  Follow-Up: YOU HAVE A FOLLOW UP WITH [place], [facility] 03/16/16 @ 10:45 POST CATH FOLLOW UP  Any Other Special Instructions Will Be Listed Below (If Applicable).  If you need a refill on your cardiac medications before your next appointment, please call your pharmacy.  Signed, [name], PA-C  02/24/2016 12:07 PM    [facility] [place], [place], [place]  [address] Phone: [phone]; Fax: [phone]   Attending Note:   The patient was seen and examined.  Agree with assessment and plan as noted above.  Changes made to the above note as needed.  Patient seen and independently examined with [name], PA .   We discussed all aspects of  the encounter. I agree with the assessment and plan as stated above.   agree with plans for cath    I have spent a total of 40 minutes with patient reviewing hospital  notes , telemetry, EKGs, labs and examining patient as well as establishing an assessment and plan that was discussed with the patient. > 50% of time was spent in direct patient care.    Thayer Headings, Brooke Bonito., MD, [facility] 03/02/2016, 1:42 PM [address]  [place] Pager [phone]

## 2016-02-24 NOTE — Patient Instructions (Addendum)
Medication Instructions:  Your physician recommends that you continue on your current medications as directed. Please refer to the Current Medication list given to you today.  Labwork: TODAY BMET, CBC, PT/INR  Testing/Procedures: Your physician has requested that you have a cardiac catheterization. Cardiac catheterization is used to diagnose and/or treat various heart conditions. Doctors may recommend this procedure for a number of different reasons. The most common reason is to evaluate chest pain. Chest pain can be a symptom of coronary artery disease (CAD), and cardiac catheterization can show whether plaque is narrowing or blocking your heart's arteries. This procedure is also used to evaluate the valves, as well as measure the blood flow and oxygen levels in different parts of your heart. For further information please visit HugeFiesta.tn. Please follow instruction sheet, as given.  Follow-Up: YOU HAVE A FOLLOW UP WITH [place], [facility] 03/16/16 @ 10:45 POST CATH FOLLOW UP  Any Other Special Instructions Will Be Listed Below (If Applicable).  If you need a refill on your cardiac medications before your next appointment, please call your pharmacy.

## 2016-02-25 LAB — CBC
Hematocrit: 47.4 % (ref 37.5–51.0)
Hemoglobin: 16.1 g/dL (ref 13.0–17.7)
MCH: 32.1 pg (ref 26.6–33.0)
MCHC: 34 g/dL (ref 31.5–35.7)
MCV: 95 fL (ref 79–97)
Platelets: 238 10*3/uL (ref 150–379)
RBC: 5.01 x10E6/uL (ref 4.14–5.80)
RDW: 13.8 % (ref 12.3–15.4)
WBC: 6.9 10*3/uL (ref 3.4–10.8)

## 2016-02-25 LAB — PROTIME-INR
INR: 1 (ref 0.8–1.2)
Prothrombin Time: 10.7 s (ref 9.1–12.0)

## 2016-02-25 LAB — BASIC METABOLIC PANEL
BUN/Creatinine Ratio: 18 (ref 10–24)
BUN: 24 mg/dL (ref 8–27)
CO2: 23 mmol/L (ref 18–29)
Calcium: 10.2 mg/dL (ref 8.6–10.2)
Chloride: 99 mmol/L (ref 96–106)
Creatinine, Ser: 1.35 mg/dL — ABNORMAL HIGH (ref 0.76–1.27)
GFR calc Af Amer: 62 mL/min/{1.73_m2} (ref >59)
GFR calc non Af Amer: 54 mL/min/{1.73_m2} — ABNORMAL LOW (ref >59)
Glucose: 97 mg/dL (ref 65–99)
Potassium: 4.6 mmol/L (ref 3.5–5.2)
Sodium: 141 mmol/L (ref 134–144)

## 2016-02-27 ENCOUNTER — Telehealth: Payer: Self-pay | Admitting: Physician Assistant

## 2016-02-27 ENCOUNTER — Telehealth: Payer: Self-pay | Admitting: *Deleted

## 2016-02-27 DIAGNOSIS — I5042 Chronic combined systolic (congestive) and diastolic (congestive) heart failure: Secondary | ICD-10-CM

## 2016-02-27 DIAGNOSIS — I420 Dilated cardiomyopathy: Secondary | ICD-10-CM

## 2016-02-27 MED ORDER — FUROSEMIDE 20 MG PO TABS: 20.0000 mg | ORAL_TABLET | Freq: Every day | ORAL | 3 refills | Status: DC

## 2016-02-27 MED ORDER — FUROSEMIDE 40 MG PO TABS: 20.0000 mg | ORAL_TABLET | Freq: Every day | ORAL | Status: DC

## 2016-02-27 NOTE — Telephone Encounter (Signed)
New Message     Returning your call has questions on the instructions you gave him

## 2016-02-27 NOTE — Telephone Encounter (Signed)
I s/w pt this morning about lab results and med changes. I had at the call this morning advised pt if he has any questions call back and I will be happy to go over recommendations again with him.  Ptcb to make sure he did understand medication changes that we made this morning. I stated to stop K+, he will hold Losartan and Lasix x 1 day. After the 1 day he will resume Losartan at his current dose of 50 mg daily; however he will decrease the lasix to 20 mg daily (this will be 1/2 tab of the 40 mg), STAT BMET 03/01/16 AM.

## 2016-02-27 NOTE — Telephone Encounter (Signed)
Pt has been notified of lab results and recommendations. Pt advised to d/c K+, hold Losartan and Lasix x 1 day; after 1 day resume Lasix at decreased dose of 20 mg daily, resume Losartan at 50 mg daily. STAT bmet 1/18. Pt agreeable to plan of care with verbal understanding x 2  By phone.

## 2016-02-29 ENCOUNTER — Telehealth: Payer: Self-pay

## 2016-02-29 NOTE — Telephone Encounter (Signed)
02/29/16 1750 Called pt to notify him of office closure 03/01/16.  He notified Probation officer of Cardiac Cath scheduled for Friday.  Instructed patient to go to [facility] draw office at [employer]. Only if he feels safe driving.  If not he was directed to notify Short Stay at [facility]. Pt verbalized understanding.  Georgana Curio MHA RN CCM

## 2016-03-01 ENCOUNTER — Telehealth: Payer: Self-pay | Admitting: Physician Assistant

## 2016-03-01 ENCOUNTER — Other Ambulatory Visit: Payer: Medicare HMO

## 2016-03-01 NOTE — Telephone Encounter (Signed)
Pt called answering service back. See prior phone note. He confirmed he is coming for procedure tomorrow. Says he actually plans to arrive closer to 7am rather than the 7:30am he was advised. He will hold his losartan and Lasix the AM of cath. He plans to still take carvedilol. Per Eliezer Champagne, will have BMET tomorrow AM prior to cath. He was grateful for call. Dayna Dunn PA-C

## 2016-03-01 NOTE — Telephone Encounter (Signed)
Ok Thank you. [name], PA-C   03/01/2016 2:52 PM

## 2016-03-01 NOTE — Telephone Encounter (Signed)
Pt called answering service on snow day. Has cath scheduled tomorrow AM. Meds recently adjusted for Cr 1.35. Returned page x 3. No answer, LMOM to call back. Per d/w Rennis Harding, cath lab will plan to check BMET upon patient's arrival to reassess.  Dayna Dunn PA-C

## 2016-03-02 ENCOUNTER — Encounter (HOSPITAL_COMMUNITY)
Admission: RE | Disposition: A | Discharge status: Home / Self Care | Payer: Self-pay | Source: Ambulatory Visit | Attending: Internal Medicine

## 2016-03-02 ENCOUNTER — Ambulatory Visit (HOSPITAL_COMMUNITY)
Admission: RE | Admit: 2016-03-02 | Discharge: 2016-03-02 | Disposition: A | Discharge status: Home / Self Care | Payer: Medicare HMO | Source: Ambulatory Visit | Attending: Internal Medicine | Admitting: Internal Medicine

## 2016-03-02 DIAGNOSIS — I272 Pulmonary hypertension, unspecified: Secondary | ICD-10-CM | POA: Insufficient documentation

## 2016-03-02 DIAGNOSIS — I420 Dilated cardiomyopathy: Secondary | ICD-10-CM

## 2016-03-02 DIAGNOSIS — I5041 Acute combined systolic (congestive) and diastolic (congestive) heart failure: Secondary | ICD-10-CM | POA: Insufficient documentation

## 2016-03-02 DIAGNOSIS — Z833 Family history of diabetes mellitus: Secondary | ICD-10-CM | POA: Diagnosis not present

## 2016-03-02 DIAGNOSIS — Z8249 Family history of ischemic heart disease and other diseases of the circulatory system: Secondary | ICD-10-CM | POA: Insufficient documentation

## 2016-03-02 DIAGNOSIS — I5042 Chronic combined systolic (congestive) and diastolic (congestive) heart failure: Secondary | ICD-10-CM | POA: Diagnosis not present

## 2016-03-02 DIAGNOSIS — I428 Other cardiomyopathies: Secondary | ICD-10-CM | POA: Diagnosis not present

## 2016-03-02 DIAGNOSIS — I5043 Acute on chronic combined systolic (congestive) and diastolic (congestive) heart failure: Secondary | ICD-10-CM | POA: Diagnosis present

## 2016-03-02 DIAGNOSIS — I2510 Atherosclerotic heart disease of native coronary artery without angina pectoris: Secondary | ICD-10-CM | POA: Insufficient documentation

## 2016-03-02 HISTORY — PX: CARDIAC CATHETERIZATION: SHX172

## 2016-03-02 LAB — BASIC METABOLIC PANEL
Anion gap: 9 (ref 5–15)
BUN: 16 mg/dL (ref 6–20)
CO2: 22 mmol/L (ref 22–32)
Calcium: 9.3 mg/dL (ref 8.9–10.3)
Chloride: 107 mmol/L (ref 101–111)
Creatinine, Ser: 1.06 mg/dL (ref 0.61–1.24)
GFR calc Af Amer: >60 mL/min (ref >60)
GFR calc non Af Amer: >60 mL/min (ref >60)
Glucose, Bld: 105 mg/dL — ABNORMAL HIGH (ref 65–99)
Potassium: 4.3 mmol/L (ref 3.5–5.1)
Sodium: 138 mmol/L (ref 135–145)

## 2016-03-02 LAB — POCT I-STAT 3, VENOUS BLOOD GAS (G3P V)
Acid-base deficit: 3 mmol/L — ABNORMAL HIGH (ref 0.0–2.0)
Bicarbonate: 22.3 mmol/L (ref 20.0–28.0)
O2 Saturation: 64 %
TCO2: 23 mmol/L (ref 0–100)
pCO2, Ven: 38.2 mmHg — ABNORMAL LOW (ref 44.0–60.0)
pH, Ven: 7.375 (ref 7.250–7.430)
pO2, Ven: 34 mmHg (ref 32.0–45.0)

## 2016-03-02 LAB — POCT I-STAT 3, ART BLOOD GAS (G3+)
Acid-base deficit: 2 mmol/L (ref 0.0–2.0)
Bicarbonate: 21.7 mmol/L (ref 20.0–28.0)
O2 Saturation: 96 %
TCO2: 23 mmol/L (ref 0–100)
pCO2 arterial: 34.5 mmHg (ref 32.0–48.0)
pH, Arterial: 7.406 (ref 7.350–7.450)
pO2, Arterial: 83 mmHg (ref 83.0–108.0)

## 2016-03-02 SURGERY — RIGHT/LEFT HEART CATH AND CORONARY ANGIOGRAPHY

## 2016-03-02 MED ORDER — LIDOCAINE HCL (PF) 1 % IJ SOLN
INTRAMUSCULAR | Status: DC | PRN
Start: 2016-03-02 — End: 2016-03-02
  Administered 2016-03-02: 2 mL via INTRADERMAL
  Administered 2016-03-02: 1 mL via INTRADERMAL

## 2016-03-02 MED ORDER — SODIUM CHLORIDE 0.9 % IV SOLN
250.0000 mL | INTRAVENOUS | Status: DC | PRN
Start: 2016-03-02 — End: 2016-03-02

## 2016-03-02 MED ORDER — FENTANYL CITRATE (PF) 100 MCG/2ML IJ SOLN
INTRAMUSCULAR | Status: AC
  Filled 2016-03-02: qty 2

## 2016-03-02 MED ORDER — SODIUM CHLORIDE 0.9 % WEIGHT BASED INFUSION: 1.0000 mL/kg/h | INTRAVENOUS | Status: DC

## 2016-03-02 MED ORDER — HEPARIN (PORCINE) IN NACL 2-0.9 UNIT/ML-% IJ SOLN
INTRAMUSCULAR | Status: DC | PRN
  Administered 2016-03-02: 10 mL via INTRA_ARTERIAL

## 2016-03-02 MED ORDER — MIDAZOLAM HCL 2 MG/2ML IJ SOLN
INTRAMUSCULAR | Status: AC
  Filled 2016-03-02: qty 2

## 2016-03-02 MED ORDER — HEPARIN SODIUM (PORCINE) 1000 UNIT/ML IJ SOLN
INTRAMUSCULAR | Status: AC
  Filled 2016-03-02: qty 1

## 2016-03-02 MED ORDER — VERAPAMIL HCL 2.5 MG/ML IV SOLN
INTRAVENOUS | Status: AC
Start: 2016-03-02 — End: 2016-03-02
  Filled 2016-03-02: qty 2

## 2016-03-02 MED ORDER — SODIUM CHLORIDE 0.9% FLUSH: 3.0000 mL | Freq: Two times a day (BID) | INTRAVENOUS | Status: DC

## 2016-03-02 MED ORDER — HEPARIN SODIUM (PORCINE) 1000 UNIT/ML IJ SOLN
INTRAMUSCULAR | Status: DC | PRN
Start: 2016-03-02 — End: 2016-03-02
  Administered 2016-03-02: 5000 [IU] via INTRAVENOUS

## 2016-03-02 MED ORDER — IOPAMIDOL (ISOVUE-370) INJECTION 76%
INTRAVENOUS | Status: AC
  Filled 2016-03-02: qty 100

## 2016-03-02 MED ORDER — IOPAMIDOL (ISOVUE-370) INJECTION 76%
INTRAVENOUS | Status: DC | PRN
  Administered 2016-03-02: 50 mL via INTRA_ARTERIAL

## 2016-03-02 MED ORDER — HEPARIN (PORCINE) IN NACL 2-0.9 UNIT/ML-% IJ SOLN
INTRAMUSCULAR | Status: AC
  Filled 2016-03-02: qty 1000

## 2016-03-02 MED ORDER — SODIUM CHLORIDE 0.9% FLUSH: 3.0000 mL | INTRAVENOUS | Status: DC | PRN

## 2016-03-02 MED ORDER — LIDOCAINE HCL (PF) 1 % IJ SOLN
INTRAMUSCULAR | Status: AC
Start: 2016-03-02 — End: 2016-03-02
  Filled 2016-03-02: qty 30

## 2016-03-02 MED ORDER — SODIUM CHLORIDE 0.9 % IV SOLN: 250.0000 mL | INTRAVENOUS | Status: DC | PRN

## 2016-03-02 MED ORDER — MIDAZOLAM HCL 2 MG/2ML IJ SOLN
INTRAMUSCULAR | Status: DC | PRN
  Administered 2016-03-02: 1 mg via INTRAVENOUS

## 2016-03-02 MED ORDER — HEPARIN (PORCINE) IN NACL 2-0.9 UNIT/ML-% IJ SOLN
INTRAMUSCULAR | Status: DC | PRN
  Administered 2016-03-02: 1000 mL

## 2016-03-02 MED ORDER — FENTANYL CITRATE (PF) 100 MCG/2ML IJ SOLN
INTRAMUSCULAR | Status: DC | PRN
  Administered 2016-03-02: 25 ug via INTRAVENOUS

## 2016-03-02 MED ORDER — SODIUM CHLORIDE 0.9 % WEIGHT BASED INFUSION
3.0000 mL/kg/h | INTRAVENOUS | Status: AC
  Administered 2016-03-02: 3 mL/kg/h via INTRAVENOUS

## 2016-03-02 SURGICAL SUPPLY — 12 items
CATH 5FR JL3.5 JR4 ANG PIG MP (CATHETERS) ×2 IMPLANT
CATH BALLN WEDGE 5F 110CM (CATHETERS) ×2 IMPLANT
DEVICE RAD COMP TR BAND LRG (VASCULAR PRODUCTS) ×2 IMPLANT
ELECT DEFIB PAD ADLT CADENCE (PAD) ×2 IMPLANT
GLIDESHEATH SLEND SS 6F .021 (SHEATH) ×4 IMPLANT
GUIDEWIRE INQWIRE 1.5J.035X260 (WIRE) ×1 IMPLANT
INQWIRE 1.5J .035X260CM (WIRE) ×2
KIT HEART LEFT (KITS) ×2 IMPLANT
PACK CARDIAC CATHETERIZATION (CUSTOM PROCEDURE TRAY) ×2 IMPLANT
SHEATH FAST CATH BRACH 5F 5CM (SHEATH) ×2 IMPLANT
TRANSDUCER W/STOPCOCK (MISCELLANEOUS) ×2 IMPLANT
TUBING CIL FLEX 10 FLL-RA (TUBING) ×2 IMPLANT

## 2016-03-02 NOTE — Interval H&P Note (Signed)
History and Physical Interval Note:  03/02/2016 11:14 AM  Jon Williams  has presented today for cardiac catheterization, with the diagnosis of severe systolic heart failure. The various methods of treatment have been discussed with the patient and family. After consideration of risks, benefits and other options for treatment, the patient has consented to  Procedure(s): Right/Left Heart Cath and Coronary Angiography (N/A) as a surgical intervention .  The patient's history has been reviewed, patient examined, no change in status, stable for surgery.  I have reviewed the patient's chart and labs.  Questions were answered to the patient's satisfaction.    Cath Lab Visit (complete for each Cath Lab visit)  Clinical Evaluation Leading to the Procedure:   ACS: No.  Non-ACS:    Anginal Classification: CCS II (Shortness of breath)  Anti-ischemic medical therapy: Minimal Therapy (1 class of medications)  Non-Invasive Test Results: No non-invasive testing performed (echocardiogram with severely reduced LV systolic function)  Prior CABG: No previous CABG   [place]

## 2016-03-02 NOTE — Progress Notes (Signed)
Site area: right brachial  Site Prior to Removal:  Level 0  Pressure Applied For 15 MINUTES    Minutes Beginning at 1245  Manual:   Yes.    Patient Status During Pull:  A&Ox3  Post Pull brachial Site:  Level 0  Post Pull Instructions Given:  Yes.    Post Pull Pulses Present:  Yes.    Dressing Applied:  Yes.    Comments:

## 2016-03-02 NOTE — Discharge Instructions (Signed)

## 2016-03-02 NOTE — H&P (View-Only) (Signed)
Cardiology Office Note   Date:  02/16/2016   ID:  Jon Williams, DOB 1951/01/29, MRN [id number]  PCP:  Kandice Hams, MD  Cardiologist:  Dr. [name]     Chief Complaint  Patient presents with  . Shortness of Breath    pt states a little when lying down at night, it doesn't always happen just off and on       History of Present Illness: Jon Williams is a 66 y.o. male who presents for cardiomyopathy.  Echo today with EF 15-20%  We called and asked pt to return today to begin meds and diuresis.  Last evening he had more SOB.   Dr. [name] and I discussed cardiomyopathy with pt.  And treatment plan.      Past Medical History:  Diagnosis Date  . CHF (congestive heart failure) ([facility])     No past surgical history on file.   Current Outpatient Prescriptions  Medication Sig Dispense Refill  . Multiple Vitamin (ONE-A-DAY MENS PO) Take by mouth daily.    . Omega-3 Fatty Acids (OMEGA 3 PO) Take by mouth daily.    . carvedilol (COREG) 3.125 MG tablet Take 1 tablet (3.125 mg total) by mouth 2 (two) times daily. 60 tablet 3  . furosemide (LASIX) 40 MG tablet Take 1 tablet (40 mg total) by mouth daily. 90 tablet 3  . losartan (COZAAR) 50 MG tablet Take 1 tablet (50 mg total) by mouth daily. 30 tablet 3  . potassium chloride (K-DUR) 10 MEQ tablet Take 1 tablet (10 mEq total) by mouth daily. 30 tablet 3   No current facility-administered medications for this visit.     Allergies:   Aspirin and Sulfa antibiotics    Social History:  The patient  reports that he has never smoked. He has never used smokeless tobacco. He reports that he does not drink alcohol or use drugs.   Family History:  The patient's family history includes Cancer in his father; Diabetes in his father and mother; Healthy in his brother and sister; Heart attack in his brother; Heart disease in his mother; Heart disease (age of onset: 20) in his brother; Hypertension in his father and mother.    ROS:  General:no  colds or fevers, no weight changes Skin:no rashes or ulcers HEENT:no blurred vision, no congestion CV:see HPI PUL:see HPI GI:no diarrhea constipation or melena, no indigestion GU:no hematuria, no dysuria MS:no joint pain, no claudication Neuro:no syncope, no lightheadedness Endo:no diabetes, no thyroid disease  Wt Readings from Last 3 Encounters:  02/16/16 209 lb 12.8 oz (95.2 kg)  02/15/16 209 lb (94.8 kg)  02/08/16 197 lb (89.4 kg)     PHYSICAL EXAM: VS:  BP (!) 144/88   Pulse 61   Ht 5\' 6"  (1.676 m)   Wt 209 lb 12.8 oz (95.2 kg)   SpO2 99%   BMI 33.86 kg/m  , BMI Body mass index is 33.86 kg/m. General:Pleasant affect, NAD Skin:Warm and dry, brisk capillary refill HEENT:normocephalic, sclera clear, mucus membranes moist Neck:supple, + JVD, no bruits  Heart:S1S2 RRR without murmur, gallup, rub or click Lungs:clear without rales, rhonchi, or wheezes [id number], non tender, + BS, do not palpate liver spleen or masses Ext:1+ lower ext edema, 2+ pedal pulses, 2+ radial pulses Neuro:alert and oriented x 3, MAE, follows commands, + facial symmetry    EKG:  EKG is NOT ordered today.    Recent Labs: 02/08/2016: B Natriuretic Peptide 705.1; Hemoglobin 14.2; Platelets 194 02/15/2016: ALT 71; BUN  15; Creatinine, Ser 0.99; Magnesium 1.9; Potassium 4.3; Sodium 145; TSH 1.770    Lipid Panel No results found for: CHOL, TRIG, HDL, CHOLHDL, VLDL, LDLCALC, LDLDIRECT     Other studies Reviewed: Additional studies/ records that were reviewed today include:   Echo:. Study Conclusions  - Left ventricle: The cavity size was moderately dilated. There was   mild concentric hypertrophy. Systolic function was severely   reduced. The estimated ejection fraction was in the range of 15%   to 20%. Severe diffuse hypokinesis. Akinesis of the   basal-midinferior and inferoseptal myocardium. Doppler parameters   are consistent with a reversible restrictive pattern, indicative   of  decreased left ventricular diastolic compliance and/or   increased left atrial pressure (grade 3 diastolic dysfunction).   Doppler parameters are consistent with high ventricular filling   pressure. - Aortic valve: Transvalvular velocity was within the normal range.   There was no stenosis. - Mitral valve: Transvalvular velocity was within the normal range.   There was no evidence for stenosis. There was mild to moderate   regurgitation. - Left atrium: The atrium was severely dilated. - Right ventricle: The cavity size was normal. Wall thickness was   normal. Systolic function was moderately reduced. - Right atrium: The atrium was moderately dilated. - Tricuspid valve: There was mild regurgitation. - Pulmonary arteries: Systolic pressure was moderately increased.   PA peak pressure: 50 mm Hg (S).   ASSESSMENT AND PLAN:  1.  Acute systolic HF  Increase lasix  2. Cardiomyopathy with Ef 15-20%  Add losartan 50 mg daily.  Plan will be to add coreg 3.125 BID.  He will see Dr. [name] next week and lab work.  When edema improved plan will be for cardiac cath.   Discussed low salt diet, weigh daily and call for wt gain.     Current medicines are reviewed with the patient today.  The patient Has no concerns regarding medicines.  The following changes have been made:  See above Labs/ tests ordered today include:see above  Disposition:   FU:  see above  Signed, Cecilie Kicks, NP  02/16/2016 3:47 PM    [facility], [place], [place], [place] Phone: [phone]; Fax: [phone]

## 2016-03-05 ENCOUNTER — Encounter (HOSPITAL_COMMUNITY): Payer: Self-pay | Admitting: Internal Medicine

## 2016-03-16 ENCOUNTER — Other Ambulatory Visit: Payer: Self-pay | Admitting: *Deleted

## 2016-03-16 ENCOUNTER — Telehealth: Payer: Self-pay | Admitting: *Deleted

## 2016-03-16 ENCOUNTER — Ambulatory Visit (INDEPENDENT_AMBULATORY_CARE_PROVIDER_SITE_OTHER): Payer: Medicare HMO | Admitting: Physician Assistant

## 2016-03-16 ENCOUNTER — Encounter: Payer: Self-pay | Admitting: Physician Assistant

## 2016-03-16 ENCOUNTER — Encounter (INDEPENDENT_AMBULATORY_CARE_PROVIDER_SITE_OTHER): Payer: Self-pay

## 2016-03-16 VITALS — BP 130/60 | HR 68 | Ht 66.0 in | Wt 199.8 lb

## 2016-03-16 DIAGNOSIS — I5042 Chronic combined systolic (congestive) and diastolic (congestive) heart failure: Secondary | ICD-10-CM | POA: Diagnosis not present

## 2016-03-16 DIAGNOSIS — I428 Other cardiomyopathies: Secondary | ICD-10-CM | POA: Diagnosis not present

## 2016-03-16 DIAGNOSIS — I2510 Atherosclerotic heart disease of native coronary artery without angina pectoris: Secondary | ICD-10-CM | POA: Diagnosis not present

## 2016-03-16 LAB — BASIC METABOLIC PANEL
BUN/Creatinine Ratio: 19 (ref 10–24)
BUN: 23 mg/dL (ref 8–27)
CO2: 21 mmol/L (ref 18–29)
Calcium: 10.3 mg/dL — ABNORMAL HIGH (ref 8.6–10.2)
Chloride: 98 mmol/L (ref 96–106)
Creatinine, Ser: 1.24 mg/dL (ref 0.76–1.27)
GFR calc Af Amer: 69 mL/min/{1.73_m2} (ref >59)
GFR calc non Af Amer: 60 mL/min/{1.73_m2} (ref >59)
Glucose: 107 mg/dL — ABNORMAL HIGH (ref 65–99)
Potassium: 4.6 mmol/L (ref 3.5–5.2)
Sodium: 138 mmol/L (ref 134–144)

## 2016-03-16 MED ORDER — SACUBITRIL-VALSARTAN 24-26 MG PO TABS: 1.0000 | ORAL_TABLET | Freq: Two times a day (BID) | ORAL | 0 refills | Status: DC

## 2016-03-16 MED ORDER — SACUBITRIL-VALSARTAN 24-26 MG PO TABS: 1.0000 | ORAL_TABLET | Freq: Two times a day (BID) | ORAL | 3 refills | Status: DC

## 2016-03-16 NOTE — Progress Notes (Signed)
Cardiology Office Note:    Date:  03/16/2016   ID:  [name], DOB 07/19/1950, MRN [id number]  PCP:  Kandice Hams, MD  Cardiologist:  Dr. Liam Rogers   Electrophysiologist:  n/a  Referring MD: [name], MD   Chief Complaint  Patient presents with  . Follow-up    CHF, s/p cardiac cath    History of Present Illness:    Jon Williams is a 66 y.o. male with a hx of systolic CHF in the setting of Dilated CM with an EF 15-20% diagnosed in 02/2016.  Last seen 02/24/16 and R/L heart cath was arranged to evaluate his cardiomyopathy.  This demonstrated mild non-obstructive CAD, mild pulmonary HTN and mildly elevated filling pressures.    He returns for Cardiology follow up.  He is here alone.  He is doing well. He denies significant shortness of breath.  He denies chest pain, orthopnea, PND, edema.  He is adherent with his medications.  He was supposed to change his Lasix to 20 mg QD but is now back on Lasix 40 + K 10.    Prior CV studies that were reviewed today include:    R/L [facility] 03/02/16 LAD irregs LCx prox 30, mid 20 RCA mid 40, dist 20 LVEDP 23 RA mean 8, RV 40/12, PA 42/20, mean 29; PCWP 17  Echo 02/16/16 Mild conc LVH, EF 15-20, severe diff HK, inf and inf-septal AK, Gr 3 DD, mild to mod MR, severe LAE, mod reduced RVSF, mod RAE, mild TR, PASP 50  Past Medical History:  Diagnosis Date  . Chronic combined systolic and diastolic CHF (congestive heart failure) ([place])    Echo 1/18: Mild conc LVH, EF 15-20, severe diff HK, inf and inf-septal AK, Gr 3 DD, mild to mod MR, severe LAE, mod reduced RVSF, mod RAE, mild TR, PASP 50  . DCM (dilated cardiomyopathy) ([place])   . LBBB (left bundle branch block)     Past Surgical History:  Procedure Laterality Date  . CARDIAC CATHETERIZATION N/A 03/02/2016   Procedure: Right/Left Heart Cath and Coronary Angiography;  Surgeon: Nelva Bush, MD;  Location: [place] [facility];  Service: Cardiovascular;  Laterality: N/A;    Current  Medications: Current Meds  Medication Sig  . carvedilol (COREG) 3.125 MG tablet Take 1 tablet (3.125 mg total) by mouth 2 (two) times daily.  . cholecalciferol (VITAMIN D) 1000 units tablet Take 1,000 Units by mouth daily.  . furosemide (LASIX) 40 MG tablet Take 40 mg by mouth daily.  . Multiple Vitamin (ONE-A-DAY MENS PO) Take 1 tablet by mouth daily.   . Omega-3 Fatty Acids (OMEGA 3 PO) Take 2 tablets by mouth daily.   . potassium chloride (K-DUR) 10 MEQ tablet Take 10 mEq by mouth daily.  . [DISCONTINUED] losartan (COZAAR) 50 MG tablet Take 1 tablet (50 mg total) by mouth daily.     Allergies:   Aspirin and Sulfa antibiotics   Social History   Social History  . Marital status: Married    Spouse name: N/A  . Number of children: N/A  . Years of education: N/A   Social History Main Topics  . Smoking status: Never Smoker  . Smokeless tobacco: Never Used  . Alcohol use No  . Drug use: No  . Sexual activity: Not Asked   Other Topics Concern  . None   Social History Narrative  . None     Family History:  The patient's family history includes Cancer in his father; Diabetes  in his father and mother; Healthy in his brother and sister; Heart attack in his brother; Heart disease in his mother; Heart disease (age of onset: 17) in his brother; Hypertension in his father and mother.   ROS:   Please see the history of present illness.    ROS All other systems reviewed and are negative.   EKGs/Labs/Other Test Reviewed:    EKG:  EKG is  ordered today.  The ekg ordered today demonstrates NSR, HR 68, LBBB, PACs  Recent Labs: 02/08/2016: B Natriuretic Peptide 705.1; Hemoglobin 14.2 02/15/2016: ALT 71; Magnesium 1.9; TSH 1.770 02/24/2016: Platelets 238 03/02/2016: BUN 16; Creatinine, Ser 1.06; Potassium 4.3; Sodium 138   Recent Lipid Panel No results found for: CHOL, TRIG, HDL, CHOLHDL, VLDL, LDLCALC, LDLDIRECT   Physical Exam:    VS:  BP 130/60   Pulse 68   Ht 5\' 6"  (1.676 m)    Wt 199 lb 12.8 oz (90.6 kg)   BMI 32.25 kg/m     Wt Readings from Last 3 Encounters:  03/16/16 199 lb 12.8 oz (90.6 kg)  03/02/16 197 lb (89.4 kg)  02/24/16 200 lb 12.8 oz (91.1 kg)     Physical Exam  Constitutional: He is oriented to person, place, and time. He appears well-developed and well-nourished. No distress.  HENT:  Head: Normocephalic and atraumatic.  Eyes: No scleral icterus.  Neck: Normal range of motion. No JVD present.  Cardiovascular: Normal rate, regular rhythm, S1 normal and S2 normal.   No murmur heard. Pulmonary/Chest: Effort normal and breath sounds normal. He has no wheezes. He has no rhonchi. He has no rales.  Abdominal: Soft. There is no tenderness.  Musculoskeletal: He exhibits no edema.  R wrist without hematoma  Neurological: He is alert and oriented to person, place, and time.  Skin: Skin is warm and dry.  Psychiatric: He has a normal mood and affect.    ASSESSMENT:    1. Chronic combined systolic and diastolic CHF (congestive heart failure) ([place])   2. NICM (nonischemic cardiomyopathy) ([place])   3. Coronary artery disease involving native coronary artery of native heart without angina pectoris    PLAN:    In order of problems listed above:  1. Chronic combined systolic and diastolic CHF - NYHA 2.  His volume is stable.    -  Continue beta-blocker, furosemide.    -  DC Losartan  -  Start Entresto 24/26 bid   -  BMET today and repeat in 1-2 weeks  -  If Creatinine increased, will reduce Furosemide to 20 QD and DC K.  -  FU in 1 month.  Consider Aldo antagonist if BP/renal function will allow  2. NICM - He denies a hx of ETOH abuse or uncontrolled HTN.  Suspect viral cardiomyopathy.  Will plan on repeat Echo ~ 3 months. If EF remains < 35, refer to EP for ICD +/- CRT.  3. CAD - Mild non-obstructive CAD by cardiac cath.  No ASA as he is allergic.  Consider adding statin Rx. At next office visit.    Medication Adjustments/Labs and Tests  Ordered: Current medicines are reviewed at length with the patient today.  Concerns regarding medicines are outlined above.  Medication changes, Labs and Tests ordered today are outlined in the Patient Instructions noted below. Patient Instructions  Medication Instructions:  1. STOP LOSARTAN AS OF TODAY 03/16/16 2. STARTING 03/17/16 MORNING YOU WILL START ENTRESTO 24/26 MG 1 TABLET TWICE DAILY  Labwork: 1. TODAY BMET 2. IN  1-2 WEEKS REPEAT BMET NEEDS TO BE DONE   Testing/Procedures: NONE  Follow-Up: 1. Jon Williams, PAC IN 1 MONTH  Any Other Special Instructions Will Be Listed Below (If Applicable).  If you need a refill on your cardiac medications before your next appointment, please call your pharmacy.   Signed, [name], PA-C  03/16/2016 11:44 AM    [facility] [address], [place], [place]  [address] Phone: [phone]; Fax: [phone]

## 2016-03-16 NOTE — Patient Instructions (Addendum)
Medication Instructions:  1. STOP LOSARTAN AS OF TODAY 03/16/16 2. STARTING 03/17/16 MORNING YOU WILL START ENTRESTO 24/26 MG 1 TABLET TWICE DAILY  Labwork: 1. TODAY BMET 2. IN 1-2 WEEKS REPEAT BMET NEEDS TO BE DONE   Testing/Procedures: NONE  Follow-Up: 1. SCOTT WEAVER, PAC IN 1 MONTH  Any Other Special Instructions Will Be Listed Below (If Applicable).  If you need a refill on your cardiac medications before your next appointment, please call your pharmacy.

## 2016-03-16 NOTE — Telephone Encounter (Signed)
Pt notified of lab results by phone with verbal understanding to plan of care to continue current Tx plan.

## 2016-03-21 ENCOUNTER — Telehealth: Payer: Self-pay | Admitting: Physician Assistant

## 2016-03-21 NOTE — Telephone Encounter (Signed)
Jon Williams is calling because he states that Jon Williams office at [facility] called [place] for surgical clearance on tooth filling for his left side of mouth. Jon Williams states that [facility] was not able to get a response with [place] for clearance as patient states he had a cath. Patient is calling to follow-up on situation. He left a number for Jon Williams at [employer] [phone]. Thanks.

## 2016-03-21 NOTE — Telephone Encounter (Signed)
I s/w pt about [facility] and needing tooth filled. I advised pt I will call Dr. Ysidro Evert office at [facility] to ask for a surgery clearance form faxed to [facility].   I now s/w Dr. Ysidro Evert office who state pt may need some type of anesthesia and what medications does he need to hold and what health conditions do they need to be aware of. I advised they will need to fax over a surgery clearance form with the type of anesthesia that will be used and as to what procedure is going to be done. I gave [phone] fax # to our medical records dept att: [name], [place].

## 2016-03-30 ENCOUNTER — Other Ambulatory Visit: Payer: Medicare HMO | Admitting: *Deleted

## 2016-03-30 DIAGNOSIS — I5042 Chronic combined systolic (congestive) and diastolic (congestive) heart failure: Secondary | ICD-10-CM

## 2016-03-30 LAB — BASIC METABOLIC PANEL
BUN/Creatinine Ratio: 19 (ref 10–24)
BUN: 26 mg/dL (ref 8–27)
CO2: 22 mmol/L (ref 18–29)
Calcium: 9.2 mg/dL (ref 8.6–10.2)
Chloride: 100 mmol/L (ref 96–106)
Creatinine, Ser: 1.39 mg/dL — ABNORMAL HIGH (ref 0.76–1.27)
GFR calc Af Amer: 60 mL/min/{1.73_m2} (ref >59)
GFR calc non Af Amer: 52 mL/min/{1.73_m2} — ABNORMAL LOW (ref >59)
Glucose: 108 mg/dL — ABNORMAL HIGH (ref 65–99)
Potassium: 3.9 mmol/L (ref 3.5–5.2)
Sodium: 140 mmol/L (ref 134–144)

## 2016-04-02 ENCOUNTER — Telehealth: Payer: Self-pay | Admitting: *Deleted

## 2016-04-02 ENCOUNTER — Telehealth: Payer: Self-pay | Admitting: Physician Assistant

## 2016-04-02 DIAGNOSIS — R972 Elevated prostate specific antigen [PSA]: Secondary | ICD-10-CM | POA: Diagnosis not present

## 2016-04-02 DIAGNOSIS — I5042 Chronic combined systolic (congestive) and diastolic (congestive) heart failure: Secondary | ICD-10-CM

## 2016-04-02 DIAGNOSIS — N5201 Erectile dysfunction due to arterial insufficiency: Secondary | ICD-10-CM | POA: Diagnosis not present

## 2016-04-02 MED ORDER — FUROSEMIDE 20 MG PO TABS: 20.0000 mg | ORAL_TABLET | Freq: Every day | ORAL | 3 refills | Status: DC

## 2016-04-02 NOTE — Telephone Encounter (Signed)
See previous phone notes where recently I had s/w [facility] on behalf of pt. I s/w [name] @ [facility] again today in regards to that we will need some kind of form sent over for the doctor as to what the dental procedure is being done and what will be medications will be used during his procedure. Jon Williams states a letter was sent over with all of the information we requested. I stated I have not yet received letter. Jon Williams said will re fax now to [phone] for [name], PA. I advised once reviewed by Brynda Rim. PA I will fax back to [facility] with recommendations from Cardiology. [name] with [facility] verbalized understanding.

## 2016-04-02 NOTE — Telephone Encounter (Signed)
New Message   Request for surgical clearance:  1. What type of surgery is being performed? Pt getting filling  2. When is this surgery scheduled? No date,Waiting for clearance  3. Are there any medications that need to be held prior to surgery and how long? Per [name] the pt is just going to be doing anethsia  4. Name of physician performing surgery? Dr. Waunita Schooner  5. What is your office phone and fax number? [phone]  [phone]

## 2016-04-02 NOTE — Telephone Encounter (Signed)
Pt notified of lab results by phone. Went over med changes with the pt and the need for repeat bmet to be done in 1 week. Pt verbalized understanding to d/c K+; hold lasix x 2 days then resume lasix at 20 mg daily, bmet 04/10/16.

## 2016-04-03 NOTE — Telephone Encounter (Signed)
Received letter from [facility] about pt's procedure and if there are any restrictions or medications that may need to be held.

## 2016-04-03 NOTE — Telephone Encounter (Signed)
Lmtcb with advising [facility] that clearance form/letter still has not yet been received by our office. I have verified this with Maudie Mercury in our med rec dept. She is receiving faxes with no problem, thought the one from [facility] is still not coming through. Not sure if problem on the end of [facility] fax machine.

## 2016-04-04 NOTE — Telephone Encounter (Signed)
Clearance letter has been faxed to [facility] today at 5:00 pm.

## 2016-04-10 ENCOUNTER — Other Ambulatory Visit: Payer: Medicare HMO | Admitting: *Deleted

## 2016-04-10 ENCOUNTER — Telehealth: Payer: Self-pay | Admitting: *Deleted

## 2016-04-10 DIAGNOSIS — I5042 Chronic combined systolic (congestive) and diastolic (congestive) heart failure: Secondary | ICD-10-CM | POA: Diagnosis not present

## 2016-04-10 LAB — BASIC METABOLIC PANEL
BUN/Creatinine Ratio: 18 (ref 10–24)
BUN: 21 mg/dL (ref 8–27)
CO2: 19 mmol/L (ref 18–29)
Calcium: 9.4 mg/dL (ref 8.6–10.2)
Chloride: 104 mmol/L (ref 96–106)
Creatinine, Ser: 1.18 mg/dL (ref 0.76–1.27)
GFR calc Af Amer: 73 mL/min/{1.73_m2} (ref >59)
GFR calc non Af Amer: 63 mL/min/{1.73_m2} (ref >59)
Glucose: 135 mg/dL — ABNORMAL HIGH (ref 65–99)
Potassium: 4.1 mmol/L (ref 3.5–5.2)
Sodium: 143 mmol/L (ref 134–144)

## 2016-04-10 NOTE — Telephone Encounter (Signed)
Pt notified of lab results and findings of elevated glucose. Pt agreeable to f/u with PCP about glucose level. Pt verified his appt 3/7 with Brynda Rim. PA.

## 2016-04-17 ENCOUNTER — Encounter: Payer: Self-pay | Admitting: Physician Assistant

## 2016-04-17 DIAGNOSIS — I2510 Atherosclerotic heart disease of native coronary artery without angina pectoris: Secondary | ICD-10-CM

## 2016-04-17 DIAGNOSIS — I2729 Other secondary pulmonary hypertension: Secondary | ICD-10-CM

## 2016-04-17 HISTORY — DX: Atherosclerotic heart disease of native coronary artery without angina pectoris: I25.10

## 2016-04-17 HISTORY — DX: Other secondary pulmonary hypertension: I27.29

## 2016-04-17 NOTE — Progress Notes (Signed)
Cardiology Office Note:    Date:  04/18/2016   ID:  [name], DOB 21-Sep-1950, MRN [id number]  PCP:  Kandice Hams, MD  Cardiologist:  Dr. Liam Rogers   Electrophysiologist:  n/a  Referring MD: [name], MD   Chief Complaint  Patient presents with  . Follow-up    CHF    History of Present Illness:    Jon Williams is a 66 y.o. male with a hx of systolic HF, NICM, non-obstructive CAD.  Last seen 03/16/16.    He returns for Cardiology follow up.  He is here alone.  Since last seen, he denies chest pain, shortness of breath, syncope, orthopnea, PND or significant pedal edema.   CHADS2-VASc=3 (CHF, 66 yo, CAD).    Prior CV studies:   The following studies were reviewed today:  R/L [facility] 03/02/16 LAD irregs LCx prox 30, mid 20 RCA mid 40, dist 20 LVEDP 23 RA mean 8, RV 40/12, PA 42/20, mean 29; PCWP 17  Echo 02/16/16 Mild conc LVH, EF 15-20, severe diff HK, inf and inf-septal AK, Gr 3 DD, mild to mod MR, severe LAE, mod reduced RVSF, mod RAE, mild TR, PASP 50  Past Medical History:  Diagnosis Date  . Chronic combined systolic and diastolic CHF (congestive heart failure) ([place])    Echo 1/18: Mild conc LVH, EF 15-20, severe diff HK, inf and inf-septal AK, Gr 3 DD, mild to mod MR, severe LAE, mod reduced RVSF, mod RAE, mild TR, PASP 50  . Coronary artery disease involving native coronary artery without angina pectoris 04/17/2016   LHC 1/18: pLCx 30, mLCx 20, mRCA 40, dRCA 20, LVEDP 23, mean RA 8, PA 42/20, PCWP 17  . LBBB (left bundle branch block)   . NICM (nonischemic cardiomyopathy) ([name])    Echo 1/18:  Mild conc LVH, EF 15-20, severe diff HK, inf and inf-septal AK, Gr 3 DD, mild to mod MR, severe LAE, mod reduced RVSF, mod RAE, mild TR, PASP 50  . Other secondary pulmonary hypertension 04/17/2016    Past Surgical History:  Procedure Laterality Date  . CARDIAC CATHETERIZATION N/A 03/02/2016   Procedure: Right/Left Heart Cath and Coronary Angiography;  Surgeon:  Nelva Bush, MD;  Location: [place] [facility];  Service: Cardiovascular;  Laterality: N/A;    Current Medications: Current Meds  Medication Sig  . carvedilol (COREG) 3.125 MG tablet Take 1 tablet (3.125 mg total) by mouth 2 (two) times daily.  . cholecalciferol (VITAMIN D) 1000 units tablet Take 1,000 Units by mouth daily.  . furosemide (LASIX) 20 MG tablet Take 1 tablet (20 mg total) by mouth daily.  . Multiple Vitamin (ONE-A-DAY MENS PO) Take 1 tablet by mouth daily.   . Omega-3 Fatty Acids (OMEGA 3 PO) Take 2 tablets by mouth daily.   . sacubitril-valsartan (ENTRESTO) 24-26 MG Take 1 tablet by mouth 2 (two) times daily.     Allergies:   Aspirin and Sulfa antibiotics   Social History   Social History  . Marital status: Married    Spouse name: N/A  . Number of children: N/A  . Years of education: N/A   Social History Main Topics  . Smoking status: Never Smoker  . Smokeless tobacco: Never Used  . Alcohol use No  . Drug use: No  . Sexual activity: Not Asked   Other Topics Concern  . None   Social History Narrative  . None     Family History  Problem Relation Age of Onset  .  Hypertension Mother   . Heart disease Mother   . Diabetes Mother   . Diabetes Father   . Hypertension Father   . Cancer Father   . Healthy Sister   . Heart attack Brother   . Heart disease Brother 51    + tobacco  . Healthy Brother      ROS:   Please see the history of present illness.    ROS All other systems reviewed and are negative.   EKGs/Labs/Other Test Reviewed:    EKG:  EKG is  ordered today.  The ekg ordered today demonstrates AFib vs Flutter, HR 71, LBBB  Recent Labs: 02/08/2016: B Natriuretic Peptide 705.1; Hemoglobin 14.2 02/15/2016: ALT 71; Magnesium 1.9; TSH 1.770 02/24/2016: Platelets 238 04/10/2016: BUN 21; Creatinine, Ser 1.18; Potassium 4.1; Sodium 143   Recent Lipid Panel No results found for: CHOL, TRIG, HDL, CHOLHDL, VLDL, LDLCALC, LDLDIRECT   Physical  Exam:    VS:  BP (!) 118/58 (BP Location: Left Arm)   Pulse 73   Ht 5\' 6"  (1.676 m)   Wt 203 lb (92.1 kg)   BMI 32.77 kg/m     Wt Readings from Last 3 Encounters:  04/18/16 203 lb (92.1 kg)  03/16/16 199 lb 12.8 oz (90.6 kg)  03/02/16 197 lb (89.4 kg)     Physical Exam  Constitutional: He is oriented to person, place, and time. He appears well-developed and well-nourished. No distress.  HENT:  Head: Normocephalic and atraumatic.  Eyes: No scleral icterus.  Neck: Normal range of motion. No JVD present.  Cardiovascular: Normal rate, S1 normal and S2 normal.  An irregularly irregular rhythm present.  No murmur heard. Pulmonary/Chest: Effort normal and breath sounds normal. He has no wheezes. He has no rhonchi. He has no rales.  Abdominal: Soft. There is no tenderness.  Musculoskeletal: He exhibits no edema.  Neurological: He is alert and oriented to person, place, and time.  Skin: Skin is warm and dry.  Psychiatric: He has a normal mood and affect.    ASSESSMENT:    1. Persistent atrial fibrillation ([place])   2. Chronic combined systolic and diastolic CHF (congestive heart failure) ([place])   3. NICM (nonischemic cardiomyopathy) ([place])   4. Coronary artery disease involving native coronary artery of native heart without angina pectoris   5. Other secondary pulmonary hypertension    PLAN:    In order of problems listed above:  1. Persistent atrial fibrillation (HCC) - His ECG today demonstrates atrial fibrillation versus flutter. We discussed the physiology of atrial fibrillation today and the rationale for anticoagulation. His heart rate is controlled. He is asymptomatic.  CHADS2-VASc=3.  He requires anticoagulation. He is not on aspirin secondary to aspirin allergy. Estimated Creatinine Clearance: 64.5 mL/min (by C-G formula based on SCr of 1.18 mg/dL). I will place him on Xarelto 20 mg daily. Obtain CMET, CBC in 4 weeks. Follow-up with Dr. [name] or me in 4 weeks. If he remains in  atrial fibrillation, consider cardioversion.  2. Chronic combined systolic and diastolic CHF (congestive heart failure) (HCC) - NYHA 2.  He is tolerating beta blocker and Entresto. Volume remains stable. Consider adding spironolactone over time.  3. NICM (nonischemic cardiomyopathy) ([place]) - Plan follow-up echocardiogram prior to next visit in one month. If EF <35, refer to EP for ICD.  4. Coronary artery disease involving native coronary artery of native heart without angina pectoris - Obtain CMET, lipid panel prior to next visit. Consider addition of statin.  5. Other  secondary pulmonary hypertension - He thinks that he may snore. Question if he has sleep apnea. I have asked him to have his wife monitor his sleeping. We may need to consider sleep testing.  Dispo:  Return in about 4 weeks (around 05/16/2016) for Close Follow Up, w/ Dr. [name], w/ [name], PA-C.   Medication Adjustments/Labs and Tests Ordered: Current medicines are reviewed at length with the patient today.  Concerns regarding medicines are outlined above.  Medication changes, Labs and Tests ordered today are outlined in the Patient Instructions noted below. Patient Instructions  Medication Instructions:  1. START XARELTO 20 MG DAILY ; YOU HAVE BEEN GIVEN SAMPLES AND A FREE 30 DAY CARD; AS WELL A PRESCRIPTION WAS SENT IN  Labwork: 1. IN 4 WEEKS YOU WILL NEED TO HAVE FASTING LIPID PANEL, CBC, CMET; THIS WILL NEED TO BE DONE ABOUT 1-2 DAYS BEFORE YOU SEE Theodoro Koval, PAC   Testing/Procedures: 1. Your physician has requested that you have an echocardiogram. Echocardiography is a painless test that uses sound waves to create images of your heart. It provides your doctor with information about the size and shape of your heart and how well your heart's chambers and valves are working. This procedure takes approximately one hour. There are no restrictions for this procedure.  Follow-Up: Kennie Karapetian, PAC IN 4-5 WEEKS SAME DAY  DR. [name] IS IN THE OFFICE  Any Other Special Instructions Will Be Listed Below (If Applicable).  If you need a refill on your cardiac medications before your next appointment, please call your pharmacy.   Signed, [name], PA-C  04/18/2016 8:59 AM    [facility] [address], [place], [place]  [address] Phone: [phone]; Fax: [phone]

## 2016-04-18 ENCOUNTER — Encounter: Payer: Self-pay | Admitting: Physician Assistant

## 2016-04-18 ENCOUNTER — Ambulatory Visit: Payer: Medicare HMO | Admitting: Nurse Practitioner

## 2016-04-18 ENCOUNTER — Ambulatory Visit (INDEPENDENT_AMBULATORY_CARE_PROVIDER_SITE_OTHER): Payer: Medicare HMO | Admitting: Physician Assistant

## 2016-04-18 VITALS — BP 118/58 | HR 73 | Ht 66.0 in | Wt 203.0 lb

## 2016-04-18 DIAGNOSIS — I5042 Chronic combined systolic (congestive) and diastolic (congestive) heart failure: Secondary | ICD-10-CM | POA: Diagnosis not present

## 2016-04-18 DIAGNOSIS — I481 Persistent atrial fibrillation: Secondary | ICD-10-CM

## 2016-04-18 DIAGNOSIS — I428 Other cardiomyopathies: Secondary | ICD-10-CM

## 2016-04-18 DIAGNOSIS — I2510 Atherosclerotic heart disease of native coronary artery without angina pectoris: Secondary | ICD-10-CM

## 2016-04-18 DIAGNOSIS — I4819 Other persistent atrial fibrillation: Secondary | ICD-10-CM

## 2016-04-18 DIAGNOSIS — I2729 Other secondary pulmonary hypertension: Secondary | ICD-10-CM | POA: Diagnosis not present

## 2016-04-18 MED ORDER — RIVAROXABAN 20 MG PO TABS: 20.0000 mg | ORAL_TABLET | Freq: Every day | ORAL | 3 refills | Status: DC

## 2016-04-18 NOTE — Patient Instructions (Addendum)
Medication Instructions:  1. START XARELTO 20 MG DAILY ; YOU HAVE BEEN GIVEN SAMPLES AND A FREE 30 DAY CARD; AS WELL A PRESCRIPTION WAS SENT IN  Labwork: 1. IN 4 WEEKS YOU WILL NEED TO HAVE FASTING LIPID PANEL, CBC, CMET; THIS WILL NEED TO BE DONE ABOUT 1-2 DAYS BEFORE YOU SEE SCOTT WEAVER, PAC   Testing/Procedures: 1. Your physician has requested that you have an echocardiogram. Echocardiography is a painless test that uses sound waves to create images of your heart. It provides your doctor with information about the size and shape of your heart and how well your heart's chambers and valves are working. This procedure takes approximately one hour. There are no restrictions for this procedure.  Follow-Up: SCOTT WEAVER, PAC IN 4-5 WEEKS SAME DAY DR. [name] IS IN THE OFFICE  Any Other Special Instructions Will Be Listed Below (If Applicable).  If you need a refill on your cardiac medications before your next appointment, please call your pharmacy.

## 2016-04-20 DIAGNOSIS — M25561 Pain in right knee: Secondary | ICD-10-CM | POA: Diagnosis not present

## 2016-04-20 DIAGNOSIS — I428 Other cardiomyopathies: Secondary | ICD-10-CM | POA: Diagnosis not present

## 2016-04-20 DIAGNOSIS — I2510 Atherosclerotic heart disease of native coronary artery without angina pectoris: Secondary | ICD-10-CM | POA: Diagnosis not present

## 2016-04-20 DIAGNOSIS — I481 Persistent atrial fibrillation: Secondary | ICD-10-CM | POA: Diagnosis not present

## 2016-04-20 DIAGNOSIS — I2729 Other secondary pulmonary hypertension: Secondary | ICD-10-CM | POA: Diagnosis not present

## 2016-04-25 ENCOUNTER — Encounter: Payer: Self-pay | Admitting: Podiatry

## 2016-04-25 ENCOUNTER — Ambulatory Visit (INDEPENDENT_AMBULATORY_CARE_PROVIDER_SITE_OTHER): Payer: Medicare HMO | Admitting: Podiatry

## 2016-04-25 DIAGNOSIS — M79606 Pain in leg, unspecified: Secondary | ICD-10-CM

## 2016-04-25 DIAGNOSIS — Q828 Other specified congenital malformations of skin: Secondary | ICD-10-CM

## 2016-04-25 DIAGNOSIS — B351 Tinea unguium: Secondary | ICD-10-CM | POA: Diagnosis not present

## 2016-04-25 DIAGNOSIS — L97511 Non-pressure chronic ulcer of other part of right foot limited to breakdown of skin: Secondary | ICD-10-CM

## 2016-04-25 NOTE — Progress Notes (Signed)
Patient ID: Jon Williams, male   DOB: 19-May-1950, 66 y.o.   MRN: [id number] Complaint:  Visit Type: Patient returns to my office for continued preventative foot care services. Complaint: Patient states" my nails have grown long and thick and become painful to walk and wear shoes" . The patient presents for preventative foot care services. No changes to ROS.  He states he develops severe callus between his first and second toes but he has been working in it and it has improved..  Podiatric Exam: Vascular: dorsalis pedis and posterior tibial pulses are palpable bilateral. Capillary return is immediate. Temperature gradient is WNL. Skin turgor WNL  Sensorium: Normal Semmes Weinstein monofilament test. Normal tactile sensation bilaterally. Nail Exam: Pt has thick disfigured discolored nails with subungual debris noted bilateral entire nail hallux through fifth toenails Ulcer Exam: There is no evidence of ulcer or pre-ulcerative changes or infection. Orthopedic Exam: Muscle tone and strength are WNL. No limitations in general ROM. No crepitus or effusions noted. Foot type and digits show no abnormalities. Bony prominences are unremarkable. 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 Skin: No Porokeratosis. No infection or ulcer.   callus noted 1/2  B/L.  Diagnosis:  Onychomycosis, , Pain in right toe, pain in left toes  Callus 1/2  B/L   Gardiner Barefoot DPM Treatment & Plan Procedures and Treatment: Consent by patient was obtained for treatment procedures. The patient understood the discussion of treatment and procedures well. All questions were answered thoroughly reviewed. Debridement of mycotic and hypertrophic toenails, 1 through 5 bilateral and clearing of subungual debris. No ulceration, no infection noted.  Debride corn/ulcer right first interspace. Return Visit-Office Procedure: Patient instructed to return to the office for a follow up visit 9 weeks. for continued evaluation and  treatment.   Gardiner Barefoot DPM

## 2016-04-30 ENCOUNTER — Telehealth: Payer: Self-pay | Admitting: *Deleted

## 2016-04-30 ENCOUNTER — Telehealth: Payer: Self-pay | Admitting: Physician Assistant

## 2016-04-30 ENCOUNTER — Other Ambulatory Visit: Payer: Self-pay | Admitting: Physician Assistant

## 2016-04-30 MED ORDER — SACUBITRIL-VALSARTAN 24-26 MG PO TABS: 1.0000 | ORAL_TABLET | Freq: Two times a day (BID) | ORAL | 3 refills | Status: DC

## 2016-04-30 NOTE — Telephone Encounter (Signed)
New message    Pt is calling stating he is at the pharmacy to get sample of entresto. He says they need a prescription for it to give it to him.

## 2016-04-30 NOTE — Telephone Encounter (Signed)
I called [facility] on [address] and confirmed they have prescription for Entresto.  Pt did not have free 30 day card with him.  Pharmacist states they told pt prior authorization was required. I reviewed chart and note from earlier today states pt was to pick up free 30 day card at office. I spoke with pt and he picked up card.  I told him prescription was at [facility]. He will go to pharmacy to pick this up

## 2016-04-30 NOTE — Telephone Encounter (Signed)
Spoke with patient about his Entresto refill.  Told him 3 refills were authorized today and was sent by E prescribe.  There was a notation that he should have drug card for a free 30 day supply.  I spoke with the pharmacist and the patient and he did not have card--he will come by our office this afternoon to pick this up.  I also told the pharmacist all of this information was confirmed by the pharmacy at 2:10 pm this afternoon.

## 2016-05-02 ENCOUNTER — Other Ambulatory Visit: Payer: Self-pay | Admitting: Physician Assistant

## 2016-05-02 ENCOUNTER — Ambulatory Visit (HOSPITAL_COMMUNITY): Payer: Medicare HMO | Attending: Internal Medicine

## 2016-05-02 ENCOUNTER — Telehealth: Payer: Self-pay | Admitting: *Deleted

## 2016-05-02 ENCOUNTER — Other Ambulatory Visit: Payer: Self-pay

## 2016-05-02 DIAGNOSIS — I428 Other cardiomyopathies: Secondary | ICD-10-CM

## 2016-05-02 DIAGNOSIS — I5042 Chronic combined systolic (congestive) and diastolic (congestive) heart failure: Secondary | ICD-10-CM

## 2016-05-02 DIAGNOSIS — I2510 Atherosclerotic heart disease of native coronary artery without angina pectoris: Secondary | ICD-10-CM | POA: Diagnosis not present

## 2016-05-02 DIAGNOSIS — I429 Cardiomyopathy, unspecified: Secondary | ICD-10-CM | POA: Insufficient documentation

## 2016-05-02 DIAGNOSIS — I447 Left bundle-branch block, unspecified: Secondary | ICD-10-CM

## 2016-05-02 LAB — ECHOCARDIOGRAM LIMITED
FS: 8 % — AB (ref 28–44)
IVS/LV PW RATIO, ED: 1.01
LA ID, A-P, ES: 46 mm
LA diam end sys: 46 mm
LA diam index: 2.29 cm/m2
LV PW d: 11.5 mm — AB (ref 0.6–1.1)
LVOT area: 3.8 cm2
LVOT diameter: 22 mm

## 2016-05-02 NOTE — Telephone Encounter (Signed)
Pt came in the office today to pick up samples of Entresto. Pt asked to s/w me so that I may go over his test results while he was here. Pt has been notified of Limited Echo results and of findings in person today. Pt understands that he will be referred to EP to consider an ICD, EF 15%. Pt aware Lorenda Hatchet EP scheduler will call him and get him a first available appt with one of our EP providers. Pt is agreeable to this plan of care for him. Pt has also been made aware that I s/w [employer] today to try and get his Entresto covered. Pt thanked me for taking the time to explain to him what his echo should and also trying to get his medication approved.

## 2016-05-02 NOTE — Telephone Encounter (Signed)
I received a message from [place] in our refill dept who states pt needs a PA to be done for his Entresto. I called [employer] [phone]. I have placed a a PA in with [facility] today and reference # is [id number]. I will see if we have samples we can give to the pt until medication is hopefully approved. I s/w Mindy in our refill dept and she will place samples at the front desk for the pt. I will call the pt to let him know PA put into [employer] and samples at the front desk.

## 2016-05-02 NOTE — Telephone Encounter (Signed)
-----   Message from Burtis Junes, NP sent at 05/02/2016 12:26 PM EDT ----- Ok to report.  Reviewed for [name], PA The limited echo still shows significant weakness of the pumping function. Needs referral to EP for ICD consideration. Otherwise, continue with current plan of care as outlined at Scott's last visit.

## 2016-05-02 NOTE — Telephone Encounter (Signed)
Patient called and stated that per [facility] he can only use one free thirty day card per year for the entresto. He already used one when he was started on it therefore he was unable to use the one he was given on 04/30/16. He is unable to afford the medication and he stated that the pharmacist informed him that this medication needs a prior authorization. Thanks, MI

## 2016-05-02 NOTE — Telephone Encounter (Signed)
Lmtcb to go over Limited Echo results and and recommendations. I also wanted to let pt know that I have placed a Prior Auth with [employer] in regards to his [name] and that we have also placed samples at the front desk for his pick up.

## 2016-05-03 DIAGNOSIS — M1711 Unilateral primary osteoarthritis, right knee: Secondary | ICD-10-CM | POA: Diagnosis not present

## 2016-05-08 ENCOUNTER — Telehealth: Payer: Self-pay | Admitting: Internal Medicine

## 2016-05-08 ENCOUNTER — Encounter: Payer: Self-pay | Admitting: Internal Medicine

## 2016-05-08 ENCOUNTER — Ambulatory Visit (INDEPENDENT_AMBULATORY_CARE_PROVIDER_SITE_OTHER): Payer: Medicare HMO | Admitting: Internal Medicine

## 2016-05-08 VITALS — BP 120/88 | HR 88 | Ht 66.0 in | Wt 201.0 lb

## 2016-05-08 DIAGNOSIS — I428 Other cardiomyopathies: Secondary | ICD-10-CM | POA: Diagnosis not present

## 2016-05-08 DIAGNOSIS — I5042 Chronic combined systolic (congestive) and diastolic (congestive) heart failure: Secondary | ICD-10-CM

## 2016-05-08 NOTE — Patient Instructions (Signed)
Medication Instructions:    Your physician recommends that you continue on your current medications as directed. Please refer to the Current Medication list given to you today.  --- If you need a refill on your cardiac medications before your next appointment, please call your pharmacy. ---  Labwork:  None ordered  Testing/Procedures: Your physician has recommended that you have a defibrillator inserted. An implantable cardioverter defibrillator (ICD) is a small device that is placed in your chest or, in rare cases, your abdomen. This device uses electrical pulses or shocks to help control life-threatening, irregular heartbeats that could lead the heart to suddenly stop beating (sudden cardiac arrest). Leads are attached to the ICD that goes into your heart. This is done in the hospital and usually requires an overnight stay.   Please call the office when you are ready to schedule this procedure.  The following dates are available (these are subject to change): 4/4, 4/10,  4/13, 4/16, 4/20, 4/27  Follow-Up:  To be determined once procedure date is arranged.  Thank you for choosing [facility]!!     Any Other Special Instructions Will Be Listed Below (If Applicable).   Cardioverter Defibrillator Implantation An implantable cardioverter defibrillator (ICD) is a small, lightweight, battery-powered device that is placed (implanted) under the skin in the chest or abdomen. Your caregiver may prescribe an ICD if:  You have had an irregular heart rhythm (arrhythmia) that originated in the lower chambers of the heart (ventricles).  Your heart has been damaged by a disease (such as coronary artery disease) or heart condition (such as a heart attack). An ICD consists of a battery that lasts several years, a small computer called a pulse generator, and wires called leads that go into the heart. It is used to detect and correct two dangerous arrhythmias: a rapid heart rhythm (tachycardia) and  an arrhythmia in which the ventricles contract in an uncoordinated way (fibrillation). When an ICD detects tachycardia, it sends an electrical signal to the heart that restores the heartbeat to normal (cardioversion). This signal is usually painless. If cardioversion does not work or if the ICD detects fibrillation, it delivers a small electrical shock to the heart (defibrillation) to restart the heart. The shock may feel like a strong jolt in the chest.ICDs may be programmed to correct other problems. Sometimes, ICDs are programmed to act as another type of implantable device called a pacemaker. Pacemakers are used to treat a slow heartbeat (bradycardia). LET YOUR CAREGIVER KNOW ABOUT:  Any allergies you have.  All medicines you are taking, including vitamins, herbs, eyedrops, and over-the-counter medicines and creams.  Previous problems you or members of your family have had with the use of anesthetics.  Any blood disorders you have had.  Other health problems you have. RISKS AND COMPLICATIONS Generally, the procedure to implant an ICD is safe. However, as with any surgical procedure, complications can occur. Possible complications associated with implanting an ICD include:  Swelling, bleeding, or bruising at the site where the ICD was implanted.  Infection at the site where the ICD was implanted.  A reaction to medicine used during the procedure.  Nerve, heart, or blood vessel damage.  Blood clots. BEFORE THE PROCEDURE  You may need to have blood tests, heart tests, or a chest X-ray done before the day of the procedure.  Ask your caregiver about changing or stopping your regular medicines.  Make plans to have someone drive you home. You may need to stay in the hospital overnight after  the procedure.  Stop smoking at least 24 hours before the procedure.  Take a bath or shower the night before the procedure. You may need to scrub your chest or abdomen with a special type of  soap.  Do not eat or drink before your procedure for as long as directed by your caregiver. Ask if it is okay to take any needed medicine with a small sip of water. PROCEDURE  The procedure to implant an ICD in your chest or abdomen is usually done at a hospital in a room that has a large X-ray machine called a fluoroscope. The machine will be above you during the procedure. It will help your caregiver see your heart during the procedure. Implanting an ICD usually takes 1-3 hours. Before the procedure:   Small monitors will be put on your body. They will be used to check your heart, blood pressure, and oxygen level.  A needle will be put into a vein in your hand or arm. This is called an intravenous (IV) access tube. Fluids and medicine will flow directly into your body through the IV tube.  Your chest or abdomen will be cleaned with a germ-killing (antiseptic) solution. The area may be shaved.  You may be given medicine to help you relax (sedative).  You will be given a medicine called a local anesthetic. This medicine will make the surgical site numb while the ICD is implanted. You will be sleepy but awake during the procedure. After you are numb the procedure will begin. The caregiver will:  Make a small cut (incision). This will make a pocket deep under your skin that will hold the pulse generator.  Guide the leads through a large blood vessel into your heart and attach them to the heart muscles. Depending on the ICD, the leads may go into one ventricle or they may go to both ventricles and into an upper chamber of the heart (atrium).  Test the ICD.  Close the incision with stitches, glue, or staples. AFTER THE PROCEDURE  You may feel pain. Some pain is normal. It may last a few days.  You may stay in a recovery area until the local anesthetic has worn off. Your blood pressure and pulse will be checked often. You will be taken to a room where your heart will be monitored.  A chest  X-ray will be taken. This is done to check that the cardioverter defibrillator is in the right place.  You may stay in the hospital overnight.  A slight bump may be seen over the skin where the ICD was placed. Sometimes, it is possible to feel the ICD under the skin. This is normal.  In the months and years afterward, your caregiver will check the device, the leads, and the battery every few months. Eventually, when the battery is low, the ICD will be replaced.   This information is not intended to replace advice given to you by your health care provider. Make sure you discuss any questions you have with your health care provider.   Document Released: 10/21/2001 Document Revised: 11/19/2012 Document Reviewed: 02/18/2012 Elsevier Interactive Patient Education [employer].

## 2016-05-08 NOTE — Telephone Encounter (Signed)
New message     Pt called back to confirm that April 13th is a good day for him , what time do you want him to come in

## 2016-05-08 NOTE — Progress Notes (Signed)
HPI [name] is referred today for evaluation of LV dysfunction in the setting of LBBB and persistent atrial fibrillation. He presented about 4 months ago with sob and was ultimately found to have severe LV dysfunction with an EF of 15%. The patient also developed atrial fib. His rate has been controlled. The atrial fib occurred after a diagnosis of chronic systolic heart failure. He has undergone maximal medical therapy and underwent repeat 2D echo which was notable for persistent LV dysfunction. He has class 2 CHF symptoms. He does not have palpitations and he has not had syncope. Left heart cath demonstrated no obstructive CAD. Allergies  Allergen Reactions  . Aspirin Anaphylaxis and Hives  . Sulfa Antibiotics Anaphylaxis and Hives     Current Outpatient Prescriptions  Medication Sig Dispense Refill  . carvedilol (COREG) 3.125 MG tablet Take 1 tablet (3.125 mg total) by mouth 2 (two) times daily. 60 tablet 3  . cholecalciferol (VITAMIN D) 1000 units tablet Take 1,000 Units by mouth daily.    . furosemide (LASIX) 40 MG tablet Take 40 mg by mouth daily.  0  . Multiple Vitamin (ONE-A-DAY MENS PO) Take 1 tablet by mouth daily.     . Omega-3 Fatty Acids (OMEGA 3 PO) Take 2 tablets by mouth daily.     . rivaroxaban (XARELTO) 20 MG TABS tablet Take 1 tablet (20 mg total) by mouth daily with supper. 90 tablet 3  . sacubitril-valsartan (ENTRESTO) 24-26 MG Take 1 tablet by mouth 2 (two) times daily. 180 tablet 3   No current facility-administered medications for this visit.      Past Medical History:  Diagnosis Date  . Chronic combined systolic and diastolic CHF (congestive heart failure) ([place])    Echo 1/18: Mild conc LVH, EF 15-20, severe diff HK, inf and inf-septal AK, Gr 3 DD, mild to mod MR, severe LAE, mod reduced RVSF, mod RAE, mild TR, PASP 50  . Coronary artery disease involving native coronary artery without angina pectoris 04/17/2016   LHC 1/18: pLCx 30, mLCx 20, mRCA 40,  dRCA 20, LVEDP 23, mean RA 8, PA 42/20, PCWP 17  . LBBB (left bundle branch block)   . NICM (nonischemic cardiomyopathy) ([place])    Echo 1/18:  Mild conc LVH, EF 15-20, severe diff HK, inf and inf-septal AK, Gr 3 DD, mild to mod MR, severe LAE, mod reduced RVSF, mod RAE, mild TR, PASP 50  . Other secondary pulmonary hypertension 04/17/2016    ROS:   All systems reviewed and negative except as noted in the HPI.   Past Surgical History:  Procedure Laterality Date  . CARDIAC CATHETERIZATION N/A 03/02/2016   Procedure: Right/Left Heart Cath and Coronary Angiography;  Surgeon: Nelva Bush, MD;  Location: [facility];  Service: Cardiovascular;  Laterality: N/A;     Family History  Problem Relation Age of Onset  . Hypertension Mother   . Heart disease Mother   . Diabetes Mother   . Diabetes Father   . Hypertension Father   . Cancer Father   . Healthy Sister   . Heart attack Brother   . Heart disease Brother 19    + tobacco  . Healthy Brother      Social History   Social History  . Marital status: Married    Spouse name: N/A  . Number of children: N/A  . Years of education: N/A   Occupational History  . Not on file.   Social History Main  Topics  . Smoking status: Never Smoker  . Smokeless tobacco: Never Used  . Alcohol use No  . Drug use: No  . Sexual activity: Not on file   Other Topics Concern  . Not on file   Social History Narrative  . No narrative on file     BP 120/88   Pulse 88   Ht 5\' 6"  (1.676 m)   Wt 201 lb (91.2 kg)   BMI 32.44 kg/m   Physical Exam:  Well appearing NAD HEENT: Unremarkable Neck:  No JVD, no thyromegally Lymphatics:  No adenopathy Back:  No CVA tenderness Lungs:  Clear with no wheezes HEART:  IRegular rate rhythm, no murmurs, no rubs, no clicks Abd:  soft, positive bowel sounds, no organomegally, no rebound, no guarding Ext:  2 plus pulses, no edema, no cyanosis, no clubbing Skin:  No rashes no nodules Neuro:  CN  II through XII intact, motor grossly intact  EKG - atrial fib with LBBB and a controlled VR.   DEVICE  Normal device function.  See PaceArt for details.   Assess/Plan: 1. Chronic systolic heart failure - he has severe LV dysfunction and I discussed the indications for ICD implant. He is considering his options and will call us if he wishes to proceed. 2. LBBB - in light of CHF and LBBB with a QRS of over 150, would suggest a BiV device if he decides to pursue device implant. 3. Atrial fib - this is persistent. I have discussed the initiation of Tikosyn at the time of his device implant. Will tentatively plan for this once he decides on when his device is to be implanted.    Mikle Bosworth.D.

## 2016-05-09 NOTE — Telephone Encounter (Signed)
Informed patient he is scheduled for 4/13.  He understands I will call him this week to go over instructions.

## 2016-05-09 NOTE — Telephone Encounter (Signed)
Follow up      Pt is calling stating that April 13th is a good day to have the procedure.  Please call him today to let him know if this date is confirmed.  Patient's wife and children need to take off of work and need to know asap if this date is confirmed.

## 2016-05-10 DIAGNOSIS — M1711 Unilateral primary osteoarthritis, right knee: Secondary | ICD-10-CM | POA: Diagnosis not present

## 2016-05-11 ENCOUNTER — Encounter: Payer: Self-pay | Admitting: *Deleted

## 2016-05-11 NOTE — Telephone Encounter (Addendum)
ICD implant letter of instructions reviewed with patient and mailed to home address. Pre procedure labs on 4/12.   Wound check on 4/24. Surgical scrub left at front desk for pt to pick up. Patient verbalized understanding and agreeable to plan.

## 2016-05-17 ENCOUNTER — Telehealth: Payer: Self-pay | Admitting: Cardiovascular Disease

## 2016-05-17 ENCOUNTER — Telehealth: Payer: Self-pay

## 2016-05-17 DIAGNOSIS — M1711 Unilateral primary osteoarthritis, right knee: Secondary | ICD-10-CM | POA: Diagnosis not present

## 2016-05-17 MED ORDER — SACUBITRIL-VALSARTAN 24-26 MG PO TABS: 1.0000 | ORAL_TABLET | Freq: Two times a day (BID) | ORAL | 1 refills | Status: DC

## 2016-05-17 NOTE — Telephone Encounter (Signed)
New message    *STAT* If patient is at the pharmacy, call can be transferred to refill team.   1. Which medications need to be refilled? (please list name of each medication and dose if known) entresto 24-26 mg  2. Which pharmacy/location (including street and city if local pharmacy) is medication to be sent to? [facility]  3. Do they need a 30 day or 90 day supply? 30 day

## 2016-05-17 NOTE — Telephone Encounter (Signed)
Patient calling to get Entresto Samples to last him until his Cardiac Cath procedure 05/25/16. Patient states he went to his pharmacy and th medication cost $247/ month. He also states he took his last dose yesterday. I informed he we would leave a 2 weeks supply for him at the front desk. He is agreeable to plan and will come pick medication up later today.

## 2016-05-18 ENCOUNTER — Telehealth: Payer: Self-pay | Admitting: *Deleted

## 2016-05-18 NOTE — Telephone Encounter (Signed)
I s/w pt today who has been advised per Brynda Rim. PA to move appt 4/13 out 3 months since pt is getting ready for ICD implant on 05/25/16 with Dr. Lovena Le. Pt has been rescheduled to 08/20/16 10:15 with Scott W.PA. Pt states Trinidad Curet, RN was going to mail some information to him about his procedure though he has not received anything yet. I stated I will send a message to Sherri to touch base with the pt to in regards to info for procedure. Pt thanked me for my time and my call today. Pt aware to keep lab appt 05/24/16. Pt is agreeable to plan of care.

## 2016-05-24 ENCOUNTER — Telehealth: Payer: Self-pay | Admitting: Internal Medicine

## 2016-05-24 ENCOUNTER — Other Ambulatory Visit (HOSPITAL_COMMUNITY): Payer: Self-pay | Admitting: *Deleted

## 2016-05-24 ENCOUNTER — Other Ambulatory Visit: Payer: Medicare HMO | Admitting: *Deleted

## 2016-05-24 ENCOUNTER — Encounter (INDEPENDENT_AMBULATORY_CARE_PROVIDER_SITE_OTHER): Payer: Self-pay

## 2016-05-24 DIAGNOSIS — I428 Other cardiomyopathies: Secondary | ICD-10-CM | POA: Diagnosis not present

## 2016-05-24 DIAGNOSIS — I2510 Atherosclerotic heart disease of native coronary artery without angina pectoris: Secondary | ICD-10-CM

## 2016-05-24 DIAGNOSIS — I5042 Chronic combined systolic (congestive) and diastolic (congestive) heart failure: Secondary | ICD-10-CM

## 2016-05-24 NOTE — Telephone Encounter (Signed)
Spoke with pt and informed him that his procedure will take approximately 1-3 hrs and verified with pt that his wound check is scheduled for 06/05/16 at 3:00pm.

## 2016-05-24 NOTE — Telephone Encounter (Signed)
New message   Patient calling has upcoming procedure with Dr. Lovena Le on tomorrow wants to know how long will the procedure take.

## 2016-05-25 ENCOUNTER — Encounter (HOSPITAL_COMMUNITY)
Admission: RE | Disposition: A | Discharge status: Home / Self Care | Payer: Self-pay | Source: Ambulatory Visit | Attending: Internal Medicine

## 2016-05-25 ENCOUNTER — Encounter (HOSPITAL_COMMUNITY): Payer: Self-pay | Admitting: Internal Medicine

## 2016-05-25 ENCOUNTER — Inpatient Hospital Stay (HOSPITAL_COMMUNITY)
Admission: RE | Admit: 2016-05-25 | Discharge: 2016-05-26 | DRG: 227 | Disposition: A | Discharge status: Home / Self Care | Payer: Medicare HMO | Source: Ambulatory Visit | Attending: Internal Medicine | Admitting: Internal Medicine

## 2016-05-25 DIAGNOSIS — I447 Left bundle-branch block, unspecified: Secondary | ICD-10-CM | POA: Diagnosis not present

## 2016-05-25 DIAGNOSIS — I428 Other cardiomyopathies: Secondary | ICD-10-CM | POA: Diagnosis present

## 2016-05-25 DIAGNOSIS — J9811 Atelectasis: Secondary | ICD-10-CM | POA: Diagnosis not present

## 2016-05-25 DIAGNOSIS — Z95810 Presence of automatic (implantable) cardiac defibrillator: Secondary | ICD-10-CM

## 2016-05-25 DIAGNOSIS — Z8249 Family history of ischemic heart disease and other diseases of the circulatory system: Secondary | ICD-10-CM

## 2016-05-25 DIAGNOSIS — I5043 Acute on chronic combined systolic (congestive) and diastolic (congestive) heart failure: Secondary | ICD-10-CM | POA: Diagnosis present

## 2016-05-25 DIAGNOSIS — I429 Cardiomyopathy, unspecified: Secondary | ICD-10-CM | POA: Diagnosis not present

## 2016-05-25 DIAGNOSIS — I5022 Chronic systolic (congestive) heart failure: Secondary | ICD-10-CM | POA: Diagnosis not present

## 2016-05-25 DIAGNOSIS — Z79899 Other long term (current) drug therapy: Secondary | ICD-10-CM | POA: Diagnosis not present

## 2016-05-25 DIAGNOSIS — I2729 Other secondary pulmonary hypertension: Secondary | ICD-10-CM | POA: Diagnosis present

## 2016-05-25 DIAGNOSIS — I481 Persistent atrial fibrillation: Principal | ICD-10-CM | POA: Diagnosis present

## 2016-05-25 DIAGNOSIS — Z7901 Long term (current) use of anticoagulants: Secondary | ICD-10-CM | POA: Diagnosis not present

## 2016-05-25 DIAGNOSIS — I5042 Chronic combined systolic (congestive) and diastolic (congestive) heart failure: Secondary | ICD-10-CM | POA: Diagnosis not present

## 2016-05-25 DIAGNOSIS — I4891 Unspecified atrial fibrillation: Secondary | ICD-10-CM | POA: Diagnosis present

## 2016-05-25 DIAGNOSIS — I4819 Other persistent atrial fibrillation: Secondary | ICD-10-CM | POA: Diagnosis present

## 2016-05-25 DIAGNOSIS — I2510 Atherosclerotic heart disease of native coronary artery without angina pectoris: Secondary | ICD-10-CM | POA: Diagnosis not present

## 2016-05-25 HISTORY — DX: Presence of automatic (implantable) cardiac defibrillator: Z95.810

## 2016-05-25 HISTORY — PX: BIV ICD INSERTION CRT-D: EP1195

## 2016-05-25 LAB — LIPID PANEL
Chol/HDL Ratio: 3.4 ratio (ref 0.0–5.0)
Cholesterol, Total: 196 mg/dL (ref 100–199)
HDL: 58 mg/dL (ref >39)
LDL Calculated: 125 mg/dL — ABNORMAL HIGH (ref 0–99)
Triglycerides: 67 mg/dL (ref 0–149)
VLDL Cholesterol Cal: 13 mg/dL (ref 5–40)

## 2016-05-25 LAB — CBC
HCT: 38.4 % — ABNORMAL LOW (ref 39.0–52.0)
Hematocrit: 42.2 % (ref 37.5–51.0)
Hemoglobin: 13.8 g/dL (ref 13.0–17.7)
Hemoglobin: 13.1 g/dL (ref 13.0–17.0)
MCH: 30.5 pg (ref 26.6–33.0)
MCH: 31.2 pg (ref 26.0–34.0)
MCHC: 32.7 g/dL (ref 31.5–35.7)
MCHC: 34.1 g/dL (ref 30.0–36.0)
MCV: 93 fL (ref 79–97)
MCV: 91.4 fL (ref 78.0–100.0)
Platelets: 228 10*3/uL (ref 150–379)
Platelets: 187 10*3/uL (ref 150–400)
RBC: 4.52 x10E6/uL (ref 4.14–5.80)
RBC: 4.2 MIL/uL — ABNORMAL LOW (ref 4.22–5.81)
RDW: 14.6 % (ref 12.3–15.4)
RDW: 13.6 % (ref 11.5–15.5)
WBC: 7 10*3/uL (ref 3.4–10.8)
WBC: 6.2 10*3/uL (ref 4.0–10.5)

## 2016-05-25 LAB — COMPREHENSIVE METABOLIC PANEL
ALT: 31 IU/L (ref 0–44)
AST: 27 IU/L (ref 0–40)
Albumin/Globulin Ratio: 1.9 (ref 1.2–2.2)
Albumin: 4.7 g/dL (ref 3.6–4.8)
Alkaline Phosphatase: 51 IU/L (ref 39–117)
BUN/Creatinine Ratio: 17 (ref 10–24)
BUN: 22 mg/dL (ref 8–27)
Bilirubin Total: 0.7 mg/dL (ref 0.0–1.2)
CO2: 23 mmol/L (ref 18–29)
Calcium: 9.4 mg/dL (ref 8.6–10.2)
Chloride: 100 mmol/L (ref 96–106)
Creatinine, Ser: 1.26 mg/dL (ref 0.76–1.27)
GFR calc Af Amer: 68 mL/min/{1.73_m2} (ref >59)
GFR calc non Af Amer: 59 mL/min/{1.73_m2} — ABNORMAL LOW (ref >59)
Globulin, Total: 2.5 g/dL (ref 1.5–4.5)
Glucose: 118 mg/dL — ABNORMAL HIGH (ref 65–99)
Potassium: 3.9 mmol/L (ref 3.5–5.2)
Sodium: 141 mmol/L (ref 134–144)
Total Protein: 7.2 g/dL (ref 6.0–8.5)

## 2016-05-25 LAB — BASIC METABOLIC PANEL
Anion gap: 9 (ref 5–15)
Anion gap: 12 (ref 5–15)
BUN: 22 mg/dL — ABNORMAL HIGH (ref 6–20)
BUN: 22 mg/dL — ABNORMAL HIGH (ref 6–20)
CO2: 22 mmol/L (ref 22–32)
CO2: 21 mmol/L — ABNORMAL LOW (ref 22–32)
Calcium: 9 mg/dL (ref 8.9–10.3)
Calcium: 9.1 mg/dL (ref 8.9–10.3)
Chloride: 108 mmol/L (ref 101–111)
Chloride: 106 mmol/L (ref 101–111)
Creatinine, Ser: 1.26 mg/dL — ABNORMAL HIGH (ref 0.61–1.24)
Creatinine, Ser: 1.2 mg/dL (ref 0.61–1.24)
GFR calc Af Amer: >60 mL/min (ref >60)
GFR calc Af Amer: >60 mL/min (ref >60)
GFR calc non Af Amer: 57 mL/min — ABNORMAL LOW (ref >60)
GFR calc non Af Amer: >60 mL/min (ref >60)
Glucose, Bld: 118 mg/dL — ABNORMAL HIGH (ref 65–99)
Glucose, Bld: 141 mg/dL — ABNORMAL HIGH (ref 65–99)
Potassium: 3.7 mmol/L (ref 3.5–5.1)
Potassium: 4.4 mmol/L (ref 3.5–5.1)
Sodium: 139 mmol/L (ref 135–145)
Sodium: 139 mmol/L (ref 135–145)

## 2016-05-25 LAB — SURGICAL PCR SCREEN
MRSA, PCR: NEGATIVE
Staphylococcus aureus: NEGATIVE

## 2016-05-25 LAB — MAGNESIUM: Magnesium: 2.2 mg/dL (ref 1.7–2.4)

## 2016-05-25 SURGERY — BIV ICD INSERTION CRT-D

## 2016-05-25 MED ORDER — CHLORHEXIDINE GLUCONATE 4 % EX LIQD
60.0000 mL | Freq: Once | CUTANEOUS | Status: DC
Start: 2016-05-25 — End: 2016-05-25
  Filled 2016-05-25: qty 60

## 2016-05-25 MED ORDER — MUPIROCIN 2 % EX OINT
TOPICAL_OINTMENT | Freq: Once | CUTANEOUS | Status: AC
  Administered 2016-05-25: 06:00:00 via NASAL

## 2016-05-25 MED ORDER — SODIUM CHLORIDE 0.9% FLUSH: 3.0000 mL | INTRAVENOUS | Status: DC | PRN

## 2016-05-25 MED ORDER — CEFAZOLIN SODIUM-DEXTROSE 2-4 GM/100ML-% IV SOLN
2.0000 g | INTRAVENOUS | Status: AC
  Administered 2016-05-25: 2 g via INTRAVENOUS

## 2016-05-25 MED ORDER — ACETAMINOPHEN 325 MG PO TABS: 325.0000 mg | ORAL_TABLET | ORAL | Status: DC | PRN

## 2016-05-25 MED ORDER — CEFAZOLIN IN D5W 1 GM/50ML IV SOLN
1.0000 g | Freq: Four times a day (QID) | INTRAVENOUS | Status: AC
  Administered 2016-05-25 – 2016-05-26 (×3): 1 g via INTRAVENOUS
  Filled 2016-05-25 (×3): qty 50

## 2016-05-25 MED ORDER — FENTANYL CITRATE (PF) 100 MCG/2ML IJ SOLN
INTRAMUSCULAR | Status: AC
  Filled 2016-05-25: qty 2

## 2016-05-25 MED ORDER — CARVEDILOL 3.125 MG PO TABS
3.1250 mg | ORAL_TABLET | Freq: Two times a day (BID) | ORAL | Status: DC
  Administered 2016-05-25 – 2016-05-26 (×3): 3.125 mg via ORAL
  Filled 2016-05-25 (×3): qty 1

## 2016-05-25 MED ORDER — MIDAZOLAM HCL 5 MG/5ML IJ SOLN
INTRAMUSCULAR | Status: DC | PRN
  Administered 2016-05-25: 2 mg via INTRAVENOUS
  Administered 2016-05-25 (×4): 1 mg via INTRAVENOUS

## 2016-05-25 MED ORDER — MIDAZOLAM HCL 5 MG/5ML IJ SOLN
INTRAMUSCULAR | Status: AC
  Filled 2016-05-25: qty 5

## 2016-05-25 MED ORDER — HEPARIN (PORCINE) IN NACL 2-0.9 UNIT/ML-% IJ SOLN
INTRAMUSCULAR | Status: DC | PRN
  Administered 2016-05-25: 08:00:00

## 2016-05-25 MED ORDER — LIDOCAINE HCL (PF) 1 % IJ SOLN
INTRAMUSCULAR | Status: AC
  Filled 2016-05-25: qty 60

## 2016-05-25 MED ORDER — IOPAMIDOL (ISOVUE-370) INJECTION 76%
INTRAVENOUS | Status: AC
  Filled 2016-05-25: qty 50

## 2016-05-25 MED ORDER — FUROSEMIDE 40 MG PO TABS
40.0000 mg | ORAL_TABLET | Freq: Every day | ORAL | Status: DC
  Administered 2016-05-25 – 2016-05-26 (×2): 40 mg via ORAL
  Filled 2016-05-25 (×2): qty 1

## 2016-05-25 MED ORDER — ONDANSETRON HCL 4 MG/2ML IJ SOLN: 4.0000 mg | Freq: Four times a day (QID) | INTRAMUSCULAR | Status: DC | PRN

## 2016-05-25 MED ORDER — RIVAROXABAN 20 MG PO TABS
20.0000 mg | ORAL_TABLET | Freq: Every day | ORAL | Status: DC
  Administered 2016-05-25: 18:00:00 20 mg via ORAL
  Filled 2016-05-25: qty 1

## 2016-05-25 MED ORDER — HEPARIN (PORCINE) IN NACL 2-0.9 UNIT/ML-% IJ SOLN
INTRAMUSCULAR | Status: AC
  Filled 2016-05-25: qty 500

## 2016-05-25 MED ORDER — HYDROCODONE-ACETAMINOPHEN 5-325 MG PO TABS
1.0000 | ORAL_TABLET | Freq: Four times a day (QID) | ORAL | Status: DC | PRN
  Administered 2016-05-25: 1 via ORAL
  Filled 2016-05-25: qty 1

## 2016-05-25 MED ORDER — SODIUM CHLORIDE 0.9 % IV SOLN: 250.0000 mL | INTRAVENOUS | Status: DC | PRN

## 2016-05-25 MED ORDER — MUPIROCIN 2 % EX OINT
TOPICAL_OINTMENT | CUTANEOUS | Status: AC
  Filled 2016-05-25: qty 22

## 2016-05-25 MED ORDER — LIDOCAINE HCL (PF) 1 % IJ SOLN
INTRAMUSCULAR | Status: DC | PRN
  Administered 2016-05-25: 45 mL

## 2016-05-25 MED ORDER — OFF THE BEAT BOOK
Freq: Once | Status: AC
  Administered 2016-05-25: 21:00:00
  Filled 2016-05-25: qty 1

## 2016-05-25 MED ORDER — SODIUM CHLORIDE 0.9 % IV SOLN
INTRAVENOUS | Status: DC
  Administered 2016-05-25: 07:00:00 via INTRAVENOUS

## 2016-05-25 MED ORDER — SODIUM CHLORIDE 0.9% FLUSH
3.0000 mL | Freq: Two times a day (BID) | INTRAVENOUS | Status: DC
  Administered 2016-05-25 (×2): 3 mL via INTRAVENOUS

## 2016-05-25 MED ORDER — POTASSIUM CHLORIDE CRYS ER 20 MEQ PO TBCR
40.0000 meq | EXTENDED_RELEASE_TABLET | Freq: Once | ORAL | Status: AC
  Administered 2016-05-25: 40 meq via ORAL
  Filled 2016-05-25: qty 2

## 2016-05-25 MED ORDER — FENTANYL CITRATE (PF) 100 MCG/2ML IJ SOLN
INTRAMUSCULAR | Status: DC | PRN
  Administered 2016-05-25: 12.5 ug via INTRAVENOUS
  Administered 2016-05-25: 25 ug via INTRAVENOUS
  Administered 2016-05-25 (×2): 12.5 ug via INTRAVENOUS

## 2016-05-25 MED ORDER — CEFAZOLIN SODIUM-DEXTROSE 2-4 GM/100ML-% IV SOLN
INTRAVENOUS | Status: AC
  Filled 2016-05-25: qty 100

## 2016-05-25 MED ORDER — IOPAMIDOL (ISOVUE-370) INJECTION 76%
INTRAVENOUS | Status: DC | PRN
  Administered 2016-05-25: 20 mL via INTRAVENOUS

## 2016-05-25 MED ORDER — YOU HAVE A PACEMAKER BOOK
Freq: Once | Status: AC
  Administered 2016-05-25: 21:00:00
  Filled 2016-05-25: qty 1

## 2016-05-25 MED ORDER — SACUBITRIL-VALSARTAN 24-26 MG PO TABS
1.0000 | ORAL_TABLET | Freq: Two times a day (BID) | ORAL | Status: DC
  Administered 2016-05-25 – 2016-05-26 (×3): 1 via ORAL
  Filled 2016-05-25 (×5): qty 1

## 2016-05-25 MED ORDER — SODIUM CHLORIDE 0.9 % IR SOLN
Status: AC
  Filled 2016-05-25: qty 2

## 2016-05-25 MED ORDER — SODIUM CHLORIDE 0.9 % IR SOLN
80.0000 mg | Status: AC
  Administered 2016-05-25: 80 mg

## 2016-05-25 MED ORDER — DOFETILIDE 500 MCG PO CAPS
500.0000 ug | ORAL_CAPSULE | Freq: Two times a day (BID) | ORAL | Status: DC
  Administered 2016-05-25: 500 ug via ORAL
  Filled 2016-05-25: qty 1

## 2016-05-25 MED ORDER — VITAMIN D 1000 UNITS PO TABS
1000.0000 [IU] | ORAL_TABLET | Freq: Every day | ORAL | Status: DC
  Administered 2016-05-25 – 2016-05-26 (×2): 1000 [IU] via ORAL
  Filled 2016-05-25 (×2): qty 1

## 2016-05-25 SURGICAL SUPPLY — 18 items
ASSURA CRTD CD3369-40C (ICD Generator) ×2 IMPLANT
CABLE SURGICAL S-101-97-12 (CABLE) ×2 IMPLANT
CATH CPS DIRECT 135 DS2C020 (CATHETERS) ×4 IMPLANT
CATH HEX JOSEPH 2-5-2 65CM 6F (CATHETERS) ×2 IMPLANT
CPS IMPLANT KIT 410190 (MISCELLANEOUS) ×2 IMPLANT
DEFIB ASSURA CRT-D (ICD Generator) ×1 IMPLANT
KIT ESSENTIALS PG (KITS) ×2 IMPLANT
LEAD DURATA 7122-60CM (Lead) ×2 IMPLANT
LEAD QUARTET 1458QL-86 (Lead) ×1 IMPLANT
LEAD TENDRIL MRI 52CM LPA1200M (Lead) ×2 IMPLANT
PAD DEFIB LIFELINK (PAD) ×2 IMPLANT
QUARTET 1458QL-86 (Lead) ×2 IMPLANT
SHEATH CLASSIC 7F (SHEATH) ×2 IMPLANT
SHEATH CLASSIC 8F (SHEATH) ×2 IMPLANT
SLITTER UNIVERSAL DS2A003 (MISCELLANEOUS) ×4 IMPLANT
TRAY PACEMAKER INSERTION (PACKS) ×2 IMPLANT
WIRE ACUITY WHISPER EDS 4648 (WIRE) ×2 IMPLANT
WIRE MAILMAN 182CM (WIRE) ×4 IMPLANT

## 2016-05-25 NOTE — Care Management Note (Signed)
Case Management Note  Patient Details  Name: Jon Williams MRN: [id number] Date of Birth: [birth year]  Subjective/Objective:     Patient will be on tikosyn, will need 7 day script on day on discharge to go main pharmacy, then will need to have patient's local pharmacy order [name] for patient.  ( he will have 7 day  Of meds til his pharmacy can get it for him)  NCM will cont to follow for dc needs.               Action/Plan:   Expected Discharge Date:                  Expected Discharge Plan:  Home/Self Care  In-House Referral:     Discharge planning Services  CM Consult  Post Acute Care Choice:    Choice offered to:     DME Arranged:    DME Agency:     HH Arranged:    HH Agency:     Status of Service:  Completed, signed off  If discussed at [employer] of Stay Meetings, dates discussed:    Additional Comments:  Zenon Mayo, RN 05/25/2016, 6:08 PM

## 2016-05-25 NOTE — Progress Notes (Signed)
/  Viona Gilmore [facility] [place] # [phone]   1. TIKOSYN 125 MG BID , 250 MG BID AND 500 MG BID  COVER- NOT COVER  PRIOR APPROVAL-YE # [phone] FOR EXCEPTION   2.DOFETILIDE 125 MG BID  COVER- YES  CO-PAY- $ 292.00  TIER- 4 DRUG  PRIOR APPROVAL- NO   3. DOFETILIDE 250 MG BID  COVER- YES  CO-PAY- $ 274.00  TIER- 4 DRUG  PRIOR APPROVAL- NO   4. DOFETILIDE 500 MG BID  COVER- YES  CO-PAY- $254.80  TIER- 4 DRUG  PRIOR APPROVAL- NO   PHARMACY : [facility]

## 2016-05-25 NOTE — Interval H&P Note (Signed)
History and Physical Interval Note:  05/25/2016 7:38 AM  Jon Williams  has presented today for surgery, with the diagnosis of hf  The various methods of treatment have been discussed with the patient and family. After consideration of risks, benefits and other options for treatment, the patient has consented to  Procedure(s): BiV ICD Insertion CRT-D (N/A) as a surgical intervention .  The patient's history has been reviewed, patient examined, no change in status, stable for surgery.  I have reviewed the patient's chart and labs.  Questions were answered to the patient's satisfaction.     Cristopher Peru

## 2016-05-25 NOTE — Progress Notes (Signed)
Pharmacy Review for Dofetilide (Tikosyn) Initiation  Admit Complaint: 66 y.o. male admitted 05/25/2016 with atrial fibrillation to be initiated on dofetilide.   Assessment:  Patient Exclusion Criteria: If any screening criteria checked as "Yes", then  patient  should NOT receive dofetilide until criteria item is corrected. If "Yes" please indicate correction plan.  YES  NO Patient  Exclusion Criteria Correction Plan  [x]  []  Baseline QTc interval is greater than or equal to 440 msec. IF above YES box checked dofetilide contraindicated unless patient has ICD; then may proceed if QTc 500-550 msec or with known ventricular conduction abnormalities may proceed with QTc 550-600 msec. QTc =   Patient had ICD placed  []  [x]  Magnesium level is less than 1.8 mEq/l : Last magnesium:  Lab Results  Component Value Date   MG 2.2 05/25/2016         []  [x]  Potassium level is less than 4 mEq/l : Last potassium:  Lab Results  Component Value Date   K 4.4 05/25/2016       After repletion of 24mEq PO x1 given earlier today  []  [x]  Patient is known or suspected to have a digoxin level greater than 2 ng/ml: No results found for: DIGOXIN    []  [x]  Creatinine clearance less than 20 ml/min (calculated using Cockcroft-Gault, actual body weight and serum creatinine): Estimated Creatinine Clearance: 62.4 mL/min (by C-G formula based on SCr of 1.2 mg/dL).    []  [x]  Patient has received drugs known to prolong the QT intervals within the last 48 hours (phenothiazines, tricyclics or tetracyclic antidepressants, erythromycin, H-1 antihistamines, cisapride, fluoroquinolones, azithromycin). Drugs not listed above may have an, as yet, undetected potential to prolong the QT interval, updated information on QT prolonging agents is available at this website:QT prolonging agents   []  [x]  Patient received a dose of hydrochlorothiazide (Oretic) alone or in any combination including triamterene (Dyazide, Maxzide) in the last  48 hours.   []  [x]  Patient received a medication known to increase dofetilide plasma concentrations prior to initial dofetilide dose:  . Trimethoprim (Primsol, Proloprim) in the last 36 hours . Verapamil (Calan, Verelan) in the last 36 hours or a sustained release dose in the last 72 hours . Megestrol (Megace) in the last 5 days  . Cimetidine (Tagamet) in the last 6 hours . Ketoconazole (Nizoral) in the last 24 hours . Itraconazole (Sporanox) in the last 48 hours  . Prochlorperazine (Compazine) in the last 36 hours    []  [x]  Patient is known to have a history of torsades de pointes; congenital or acquired long QT syndromes.   []  [x]  Patient has received a Class 1 antiarrhythmic with less than 2 half-lives since last dose. (Disopyramide, Quinidine, Procainamide, Lidocaine, Mexiletine, Flecainide, Propafenone)   []  [x]  Patient has received amiodarone therapy in the past 3 months or amiodarone level is greater than 0.3 ng/ml.    Patient has been appropriately anticoagulated with Xarelto.  Ordering provider was confirmed at [email or web address] if they are not listed on the [place] Prescribers list.  Goal of Therapy: Follow renal function, electrolytes, potential drug interactions, and dose adjustment. Provide education and 1 week supply at discharge.  Plan:  [x]   Physician selected initial dose within range recommended for patients level of renal function - will monitor for response.  []   Physician selected initial dose outside of range recommended for patients level of renal function - will discuss if the dose should be altered at this time.   Select One Calculated CrCl  Dose q12h  [x]  > 60 ml/min 500 mcg  []  40-60 ml/min 250 mcg  []  20-40 ml/min 125 mcg   2. Follow up QTc after the first 5 doses, renal function, electrolytes (K & Mg) daily x 3 days, dose adjustment, success of initiation and facilitate 1 week discharge supply as clinically indicated.  3. Initiate Tikosyn  education video (Call [phone] and ask for Tikosyn Video # 116).  4. Place Enrollment Form on the chart for discharge supply of dofetilide.   Luzmaria Devaux 7:22 PM 05/25/2016

## 2016-05-25 NOTE — H&P (View-Only) (Signed)
HPI Jon Williams is referred today for evaluation of LV dysfunction in the setting of LBBB and persistent atrial fibrillation. He presented about 4 months ago with sob and was ultimately found to have severe LV dysfunction with an EF of 15%. The patient also developed atrial fib. His rate has been controlled. The atrial fib occurred after a diagnosis of chronic systolic heart failure. He has undergone maximal medical therapy and underwent repeat 2D echo which was notable for persistent LV dysfunction. He has class 2 CHF symptoms. He does not have palpitations and he has not had syncope. Left heart cath demonstrated no obstructive CAD. Allergies  Allergen Reactions  . Aspirin Anaphylaxis and Hives  . Sulfa Antibiotics Anaphylaxis and Hives     Current Outpatient Prescriptions  Medication Sig Dispense Refill  . carvedilol (COREG) 3.125 MG tablet Take 1 tablet (3.125 mg total) by mouth 2 (two) times daily. 60 tablet 3  . cholecalciferol (VITAMIN D) 1000 units tablet Take 1,000 Units by mouth daily.    . furosemide (LASIX) 40 MG tablet Take 40 mg by mouth daily.  0  . Multiple Vitamin (ONE-A-DAY MENS PO) Take 1 tablet by mouth daily.     . Omega-3 Fatty Acids (OMEGA 3 PO) Take 2 tablets by mouth daily.     . rivaroxaban (XARELTO) 20 MG TABS tablet Take 1 tablet (20 mg total) by mouth daily with supper. 90 tablet 3  . sacubitril-valsartan (ENTRESTO) 24-26 MG Take 1 tablet by mouth 2 (two) times daily. 180 tablet 3   No current facility-administered medications for this visit.      Past Medical History:  Diagnosis Date  . Chronic combined systolic and diastolic CHF (congestive heart failure) ([place])    Echo 1/18: Mild conc LVH, EF 15-20, severe diff HK, inf and inf-septal AK, Gr 3 DD, mild to mod MR, severe LAE, mod reduced RVSF, mod RAE, mild TR, PASP 50  . Coronary artery disease involving native coronary artery without angina pectoris 04/17/2016   LHC 1/18: pLCx 30, mLCx 20, mRCA 40,  dRCA 20, LVEDP 23, mean RA 8, PA 42/20, PCWP 17  . LBBB (left bundle branch block)   . NICM (nonischemic cardiomyopathy) ([place])    Echo 1/18:  Mild conc LVH, EF 15-20, severe diff HK, inf and inf-septal AK, Gr 3 DD, mild to mod MR, severe LAE, mod reduced RVSF, mod RAE, mild TR, PASP 50  . Other secondary pulmonary hypertension 04/17/2016    ROS:   All systems reviewed and negative except as noted in the HPI.   Past Surgical History:  Procedure Laterality Date  . CARDIAC CATHETERIZATION N/A 03/02/2016   Procedure: Right/Left Heart Cath and Coronary Angiography;  Surgeon: Nelva Bush, MD;  Location: [facility];  Service: Cardiovascular;  Laterality: N/A;     Family History  Problem Relation Age of Onset  . Hypertension Mother   . Heart disease Mother   . Diabetes Mother   . Diabetes Father   . Hypertension Father   . Cancer Father   . Healthy Sister   . Heart attack Brother   . Heart disease Brother 51    + tobacco  . Healthy Brother      Social History   Social History  . Marital status: Married    Spouse name: N/A  . Number of children: N/A  . Years of education: N/A   Occupational History  . Not on file.   Social History Main  Topics  . Smoking status: Never Smoker  . Smokeless tobacco: Never Used  . Alcohol use No  . Drug use: No  . Sexual activity: Not on file   Other Topics Concern  . Not on file   Social History Narrative  . No narrative on file     BP 120/88   Pulse 88   Ht 5\' 6"  (1.676 m)   Wt 201 lb (91.2 kg)   BMI 32.44 kg/m   Physical Exam:  Well appearing NAD HEENT: Unremarkable Neck:  No JVD, no thyromegally Lymphatics:  No adenopathy Back:  No CVA tenderness Lungs:  Clear with no wheezes HEART:  IRegular rate rhythm, no murmurs, no rubs, no clicks Abd:  soft, positive bowel sounds, no organomegally, no rebound, no guarding Ext:  2 plus pulses, no edema, no cyanosis, no clubbing Skin:  No rashes no nodules Neuro:  CN  II through XII intact, motor grossly intact  EKG - atrial fib with LBBB and a controlled VR.   DEVICE  Normal device function.  See PaceArt for details.   Assess/Plan: 1. Chronic systolic heart failure - he has severe LV dysfunction and I discussed the indications for ICD implant. He is considering his options and will call us if he wishes to proceed. 2. LBBB - in light of CHF and LBBB with a QRS of over 150, would suggest a BiV device if he decides to pursue device implant. 3. Atrial fib - this is persistent. I have discussed the initiation of Tikosyn at the time of his device implant. Will tentatively plan for this once he decides on when his device is to be implanted.    Mikle Bosworth.D.

## 2016-05-26 ENCOUNTER — Inpatient Hospital Stay (HOSPITAL_COMMUNITY): Payer: Medicare HMO

## 2016-05-26 DIAGNOSIS — I429 Cardiomyopathy, unspecified: Secondary | ICD-10-CM

## 2016-05-26 DIAGNOSIS — Z95810 Presence of automatic (implantable) cardiac defibrillator: Secondary | ICD-10-CM

## 2016-05-26 LAB — BASIC METABOLIC PANEL
Anion gap: 7 (ref 5–15)
BUN: 20 mg/dL (ref 6–20)
CO2: 23 mmol/L (ref 22–32)
Calcium: 8.9 mg/dL (ref 8.9–10.3)
Chloride: 107 mmol/L (ref 101–111)
Creatinine, Ser: 1.16 mg/dL (ref 0.61–1.24)
GFR calc Af Amer: >60 mL/min (ref >60)
GFR calc non Af Amer: >60 mL/min (ref >60)
Glucose, Bld: 114 mg/dL — ABNORMAL HIGH (ref 65–99)
Potassium: 4 mmol/L (ref 3.5–5.1)
Sodium: 137 mmol/L (ref 135–145)

## 2016-05-26 LAB — MAGNESIUM: Magnesium: 2 mg/dL (ref 1.7–2.4)

## 2016-05-26 NOTE — Discharge Summary (Signed)
ELECTROPHYSIOLOGY PROCEDURE DISCHARGE SUMMARY    Patient ID: Jon Williams,  MRN: [id number], DOB/AGE: March 31, 1950 66 y.o.  Admit date: 05/25/2016 Discharge date: 05/26/2016   Electrophysiologist: Dr Lovena Le  Primary Discharge Diagnosis:  Nonischemic cardiomyopathy  Secondary Discharge Diagnosis:  Left bundle branch block Persistent atrial fibrillation  Allergies  Allergen Reactions  . Aspirin Anaphylaxis and Hives  . Sulfa Antibiotics Anaphylaxis and Hives     Procedures This Admission:  1.  Implantation of a St Jude Medical Biventricular ICD on 05/25/16 by Dr Lovena Le.  The patient received a [place] MP CRT-D model number CD [id number] ICD.  DFT's were deferred at time of implant.  There were no immediate post procedure complications. 2.  CXR on 05/26/16 demonstrated no pneumothorax status post device implantation.  There was reduced lead slack on the atrial lead however interrogation revealed stable lead measurements.   Brief HPI: Jon Williams is a 66 y.o. male was referred to electrophysiology in the outpatient setting for consideration of ICD implantation.  Past medical history includes persistent afib, nonischemic CM and LBBB.  The patient has persistent LV dysfunction despite guideline directed therapy.  Risks, benefits, and alternatives to ICD implantation were reviewed with the patient who wished to proceed.   Hospital Course:  The patient was admitted and underwent implantation of a BiV ICD with details as outlined above. He was monitored on telemetry overnight which demonstrated frequent PMT.  His BIV ICD was interrogated and the problem was corrected with change in PVARP to 350 msec.  Left chest was without hematoma or ecchymosis.  The device was interrogated and found to be functioning normally.  CXR was obtained and demonstrated no pneumothorax status post device implantation. There was reduced lead slack on the atrial lead however interrogation  revealed stable lead measurements.   Wound care, arm mobility, and restrictions were reviewed with the patient.  The patient was examined and considered stable for discharge to home.  The patient received 1 dose of tikosyn.  Plans were initially for tikosyn load during this hospitalization.  Unfortunately, on review by case management, costs of tikosyn for the patient were found to be more than $250 per month.  Upon discussion with the patient, he elected to stop tikosyn and follow-up with Dr Lovena Le regarding other AAD options electively as an outpatient.  We discussed sotalol as a very good option for him which he and Dr Lovena Le could consider.   The patient's discharge medications include an ARB (entresto- valsartan) and beta blocker (coreg).   Physical Exam: Vitals:   05/25/16 1447 05/25/16 1923 05/26/16 0150 05/26/16 0700  BP: 119/73 (!) 144/88 127/67 119/70  Pulse: 78 75 75 66  Resp: (!) 23 20 20 19   Temp: 97.5 F (36.4 C) 98 F (36.7 C) 97.9 F (36.6 C) 98.2 F (36.8 C)  TempSrc: Oral Oral Oral Oral  SpO2: 100% 100% 99% 99%  Weight:   210 lb 1.6 oz (95.3 kg)   Height:        GEN- The patient is well appearing, alert and oriented x 3 today.   HEENT: normocephalic, atraumatic; sclera clear, conjunctiva pink; hearing intact; oropharynx clear; neck supple, no JVP Lymph- no cervical lymphadenopathy Lungs- Clear to ausculation bilaterally, normal work of breathing.  No wheezes, rales, rhonchi Heart- Regular rate and rhythm, no murmurs, rubs or gallops, PMI not laterally displaced GI- soft, non-tender, non-distended, bowel sounds present, no hepatosplenomegaly Extremities- no clubbing, cyanosis, or edema; DP/PT/radial pulses 2+  bilaterally MS- no significant deformity or atrophy Skin- warm and dry, no rash or lesion, left chest without hematoma/ecchymosis Psych- euthymic mood, full affect Neuro- strength and sensation are intact   Labs:   Lab Results  Component Value Date   WBC  6.2 05/25/2016   HGB 13.1 05/25/2016   HCT 38.4 (L) 05/25/2016   MCV 91.4 05/25/2016   PLT 187 05/25/2016     Recent Labs Lab 05/26/16 0155  NA 137  K 4.0  CL 107  CO2 23  BUN 20  CREATININE 1.16  CALCIUM 8.9  GLUCOSE 114*    Discharge Medications:  Allergies as of 05/26/2016      Reactions   Aspirin Anaphylaxis, Hives   Sulfa Antibiotics Anaphylaxis, Hives      Medication List    TAKE these medications   carvedilol 3.125 MG tablet Commonly known as:  COREG Take 1 tablet (3.125 mg total) by mouth 2 (two) times daily.   cholecalciferol 1000 units tablet Commonly known as:  VITAMIN D Take 1,000 Units by mouth daily.   furosemide 40 MG tablet Commonly known as:  LASIX Take 40 mg by mouth daily.   OMEGA 3 PO Take 1 tablet by mouth 2 (two) times daily.   ONE-A-DAY MENS PO Take 1 tablet by mouth daily.   rivaroxaban 20 MG Tabs tablet Commonly known as:  XARELTO Take 1 tablet (20 mg total) by mouth daily with supper.   sacubitril-valsartan 24-26 MG Commonly known as:  ENTRESTO Take 1 tablet by mouth 2 (two) times daily.       Disposition:   Follow-up Information    [facility] Follow up on 06/05/2016.   Why:  3pm for wound check Contact information: [place] [id number] [phone]          Duration of Discharge Encounter: Greater than 30 minutes including physician time.  Army Fossa MD 05/26/2016 8:42 AM

## 2016-05-26 NOTE — Discharge Instructions (Signed)
° ° °  Supplemental Discharge Instructions for   Defibrillator Patients  Activity No heavy lifting or vigorous activity with your left/right arm for 6 to 8 weeks.  Do not raise your left/right arm above your head for one week.  Gradually raise your affected arm as drawn below.    NO DRIVING for 1 week  WOUND CARE - Keep the wound area clean and dry.  Do not get this area wet for one week. No showers for one week; you may shower in 7 days - The tape/steri-strips on your wound will fall off; do not pull them off.  No bandage is needed on the site.  DO  NOT apply any creams, oils, or ointments to the wound area. - If you notice any drainage or discharge from the wound, any swelling or bruising at the site, or you develop a fever > 101? F after you are discharged home, call the office at once.  Special Instructions - You are still able to use cellular telephones; use the ear opposite the side where you have your defibrillator.  Avoid carrying your cellular phone near your device. - When traveling through airports, show security personnel your identification card to avoid being screened in the metal detectors.  Ask the security personnel to use the hand wand. - Avoid arc welding equipment, MRI testing (magnetic resonance imaging), TENS units (transcutaneous nerve stimulators).  Call the office for questions about other devices. - Avoid electrical appliances that are in poor condition or are not properly grounded. - Microwave ovens are safe to be near or to operate.  Additional information for defibrillator patients should your device go off: - If your device goes off ONCE and you feel fine afterward, notify the device clinic nurses. - If your device goes off ONCE and you do not feel well afterward, call 911. - If your device goes off TWICE, call 911. - If your device goes off THREE times in one day, call 911.  DO NOT DRIVE YOURSELF OR A FAMILY MEMBER WITH A DEFIBRILLATOR TO THE HOSPITAL--CALL  911.

## 2016-05-26 NOTE — Progress Notes (Signed)
Paged on call fellow Dr. Mliss Sax from cardiology related to QTc >550. QTc ranges after 1st dose tikosyn 560-610. Around 0045 patient rate changed from a.fib 70's bi-ventricular paced to what looks like a.fib 110-120 v.paced then changes to a-v paced rate of 60 less than 6 seconds followed by increases back to rate of 110-120. Cycle continues.   Patient lying in bed, blood pressure 127/67. Asymptomatic, will continue to monitor.  No new orders at this time.

## 2016-05-28 ENCOUNTER — Ambulatory Visit: Payer: Medicare HMO | Admitting: Physician Assistant

## 2016-05-29 ENCOUNTER — Telehealth: Payer: Self-pay | Admitting: *Deleted

## 2016-05-29 DIAGNOSIS — I2510 Atherosclerotic heart disease of native coronary artery without angina pectoris: Secondary | ICD-10-CM

## 2016-05-29 MED ORDER — ATORVASTATIN CALCIUM 40 MG PO TABS: 40.0000 mg | ORAL_TABLET | Freq: Every day | ORAL | 3 refills | Status: DC

## 2016-05-29 NOTE — Telephone Encounter (Signed)
Pt notified of lab results and recommendation to start Atorvastatin 40 mg daily, Rx sent in to [facility]. FLP/LFT to be done 7/9 when he sees [place]. [place]. Pt is agreeable to plan of care. Pt thanked me for my call today.

## 2016-05-29 NOTE — Telephone Encounter (Signed)
Lmtcb to go over lab results and recommendations.  

## 2016-05-29 NOTE — Telephone Encounter (Signed)
Follow Up: ° ° °Returning your call,concerning his lab results. °

## 2016-05-29 NOTE — Telephone Encounter (Signed)
-----   Message from Liliane Shi, [place] sent at 05/27/2016  7:50 PM EDT ----- Please call the patient Kidney function is stable. Liver function is normal. The hemoglobin is normal. Lipids are ok but current recommendations are for him to be on a statin drug to manage cholesterol and lower risk for future cardiovascular events (heart attacks and strokes). Start Atorvastatin 40 mg QD Check Lipids and LFTS in 2 mos.  [name], PA-C    05/27/2016 7:50 PM

## 2016-06-02 ENCOUNTER — Other Ambulatory Visit: Payer: Self-pay | Admitting: Cardiology

## 2016-06-02 MED ORDER — SACUBITRIL-VALSARTAN 24-26 MG PO TABS: 1.0000 | ORAL_TABLET | Freq: Two times a day (BID) | ORAL | 1 refills | Status: DC

## 2016-06-06 ENCOUNTER — Telehealth: Payer: Self-pay | Admitting: *Deleted

## 2016-06-06 ENCOUNTER — Other Ambulatory Visit: Payer: Medicare HMO | Admitting: *Deleted

## 2016-06-06 ENCOUNTER — Ambulatory Visit (INDEPENDENT_AMBULATORY_CARE_PROVIDER_SITE_OTHER): Payer: Medicare HMO | Admitting: *Deleted

## 2016-06-06 DIAGNOSIS — I428 Other cardiomyopathies: Secondary | ICD-10-CM | POA: Diagnosis not present

## 2016-06-06 DIAGNOSIS — I447 Left bundle-branch block, unspecified: Secondary | ICD-10-CM

## 2016-06-06 DIAGNOSIS — I4891 Unspecified atrial fibrillation: Secondary | ICD-10-CM

## 2016-06-06 DIAGNOSIS — I2510 Atherosclerotic heart disease of native coronary artery without angina pectoris: Secondary | ICD-10-CM

## 2016-06-06 MED ORDER — SOTALOL HCL 80 MG PO TABS: 80.0000 mg | ORAL_TABLET | Freq: Two times a day (BID) | ORAL | 3 refills | Status: DC

## 2016-06-06 NOTE — Telephone Encounter (Signed)
Jon Doing, RN was seeing patient for a device check and the patient requested samples of entresto and xarelto. I gave her two weeks supply of both medications along with patient assistance forms to give to the patient.

## 2016-06-07 ENCOUNTER — Ambulatory Visit
Admission: RE | Admit: 2016-06-07 | Discharge: 2016-06-07 | Disposition: A | Discharge status: Home / Self Care | Payer: Medicare HMO | Source: Ambulatory Visit | Attending: Internal Medicine | Admitting: Internal Medicine

## 2016-06-07 DIAGNOSIS — I428 Other cardiomyopathies: Secondary | ICD-10-CM

## 2016-06-07 DIAGNOSIS — I447 Left bundle-branch block, unspecified: Secondary | ICD-10-CM

## 2016-06-07 DIAGNOSIS — I517 Cardiomegaly: Secondary | ICD-10-CM | POA: Diagnosis not present

## 2016-06-07 DIAGNOSIS — Z8679 Personal history of other diseases of the circulatory system: Secondary | ICD-10-CM

## 2016-06-07 HISTORY — DX: Personal history of other diseases of the circulatory system: Z86.79

## 2016-06-07 LAB — CUP PACEART INCLINIC DEVICE CHECK
Brady Statistic RA Percent Paced: 61 %
Brady Statistic RV Percent Paced: 96 %
Date Time Interrogation Session: 20180425190139
HighPow Impedance: 54 Ohm
Implantable Lead Implant Date: 20180413
Implantable Lead Implant Date: 20180413
Implantable Lead Implant Date: 20180413
Implantable Lead Location: 753858
Implantable Lead Location: 753859
Implantable Lead Location: 753860
Implantable Lead Model: 7122
Implantable Pulse Generator Implant Date: 20180413
Lead Channel Impedance Value: 512.5 Ohm
Lead Channel Impedance Value: 525 Ohm
Lead Channel Impedance Value: 662.5 Ohm
Lead Channel Pacing Threshold Amplitude: 0.75 V
Lead Channel Pacing Threshold Amplitude: 0.75 V
Lead Channel Pacing Threshold Amplitude: 0.75 V
Lead Channel Pacing Threshold Amplitude: 0.75 V
Lead Channel Pacing Threshold Amplitude: 1.5 V
Lead Channel Pacing Threshold Amplitude: 2 V
Lead Channel Pacing Threshold Pulse Width: 0.5 ms
Lead Channel Pacing Threshold Pulse Width: 0.5 ms
Lead Channel Pacing Threshold Pulse Width: 0.5 ms
Lead Channel Pacing Threshold Pulse Width: 0.5 ms
Lead Channel Pacing Threshold Pulse Width: 0.5 ms
Lead Channel Pacing Threshold Pulse Width: 0.8 ms
Lead Channel Sensing Intrinsic Amplitude: 1.6 mV
Lead Channel Sensing Intrinsic Amplitude: 11.7 mV
Lead Channel Setting Pacing Amplitude: 3.5 V
Lead Channel Setting Pacing Amplitude: 3.5 V
Lead Channel Setting Pacing Amplitude: 3.5 V
Lead Channel Setting Pacing Pulse Width: 0.5 ms
Lead Channel Setting Pacing Pulse Width: 0.5 ms
Lead Channel Setting Sensing Sensitivity: 0.5 mV
Pulse Gen Serial Number: 7398151

## 2016-06-07 LAB — HEPATIC FUNCTION PANEL
ALT: 41 IU/L (ref 0–44)
AST: 32 IU/L (ref 0–40)
Albumin: 4.6 g/dL (ref 3.6–4.8)
Alkaline Phosphatase: 56 IU/L (ref 39–117)
Bilirubin Total: 0.6 mg/dL (ref 0.0–1.2)
Bilirubin, Direct: 0.19 mg/dL (ref 0.00–0.40)
Total Protein: 7.3 g/dL (ref 6.0–8.5)

## 2016-06-07 LAB — LIPID PANEL
Chol/HDL Ratio: 2.2 ratio (ref 0.0–5.0)
Cholesterol, Total: 132 mg/dL (ref 100–199)
HDL: 60 mg/dL (ref >39)
LDL Calculated: 61 mg/dL (ref 0–99)
Triglycerides: 53 mg/dL (ref 0–149)
VLDL Cholesterol Cal: 11 mg/dL (ref 5–40)

## 2016-06-07 NOTE — Progress Notes (Signed)
Wound check appointment. Steri-strips removed. Wound without redness or edema. Incision edges approximated, wound well healed. Normal device function. RV and RA Thresholds, sensing, and impedances consistent with implant measurements. LV threshold noted to be 2.0V@0 .58ms (D1-M2) at implant LV 0.5V@0 .44ms. Multi-Vector testing performed~ lowest threshold with current LV programming. Per JA pt to obtain CXR.  Device programmed at 3.5V for extra safety margin until 3 month visit. Histogram distribution appropriate for patient and level of activity. 318 AMS~ EGMs indicate sensor + Xarelto. No ventricular arrhythmias noted. Patient educated about wound care, arm mobility, lifting restrictions, shock plan. Per Chanetta Marshall NP pt started on Sotalol 80mg  2x daily. EKG done. Pt also given samples of Entresto and Xarelto and pt assistance forms. Pt to f/u with A-Fib clinic 06/11/2016.

## 2016-06-11 ENCOUNTER — Encounter (HOSPITAL_COMMUNITY): Payer: Self-pay | Admitting: Nurse Practitioner

## 2016-06-11 ENCOUNTER — Other Ambulatory Visit (HOSPITAL_COMMUNITY): Payer: Self-pay | Admitting: *Deleted

## 2016-06-11 ENCOUNTER — Ambulatory Visit (HOSPITAL_COMMUNITY)
Admission: RE | Admit: 2016-06-11 | Discharge: 2016-06-11 | Disposition: A | Discharge status: Home / Self Care | Payer: Medicare HMO | Source: Ambulatory Visit | Attending: Nurse Practitioner | Admitting: Nurse Practitioner

## 2016-06-11 VITALS — BP 106/64 | HR 65 | Ht 66.0 in | Wt 200.0 lb

## 2016-06-11 DIAGNOSIS — Z79899 Other long term (current) drug therapy: Secondary | ICD-10-CM | POA: Insufficient documentation

## 2016-06-11 DIAGNOSIS — I429 Cardiomyopathy, unspecified: Secondary | ICD-10-CM | POA: Insufficient documentation

## 2016-06-11 DIAGNOSIS — Z833 Family history of diabetes mellitus: Secondary | ICD-10-CM | POA: Insufficient documentation

## 2016-06-11 DIAGNOSIS — Z8249 Family history of ischemic heart disease and other diseases of the circulatory system: Secondary | ICD-10-CM | POA: Diagnosis not present

## 2016-06-11 DIAGNOSIS — Z9889 Other specified postprocedural states: Secondary | ICD-10-CM | POA: Diagnosis not present

## 2016-06-11 DIAGNOSIS — I4891 Unspecified atrial fibrillation: Secondary | ICD-10-CM | POA: Diagnosis not present

## 2016-06-11 DIAGNOSIS — I5042 Chronic combined systolic (congestive) and diastolic (congestive) heart failure: Secondary | ICD-10-CM | POA: Insufficient documentation

## 2016-06-11 LAB — BASIC METABOLIC PANEL
Anion gap: 8 (ref 5–15)
BUN: 15 mg/dL (ref 6–20)
CO2: 25 mmol/L (ref 22–32)
Calcium: 9.5 mg/dL (ref 8.9–10.3)
Chloride: 107 mmol/L (ref 101–111)
Creatinine, Ser: 1.07 mg/dL (ref 0.61–1.24)
GFR calc Af Amer: >60 mL/min (ref >60)
GFR calc non Af Amer: >60 mL/min (ref >60)
Glucose, Bld: 103 mg/dL — ABNORMAL HIGH (ref 65–99)
Potassium: 3.5 mmol/L (ref 3.5–5.1)
Sodium: 140 mmol/L (ref 135–145)

## 2016-06-11 LAB — MAGNESIUM: Magnesium: 1.9 mg/dL (ref 1.7–2.4)

## 2016-06-11 MED ORDER — POTASSIUM CHLORIDE ER 10 MEQ PO TBCR: 10.0000 meq | EXTENDED_RELEASE_TABLET | Freq: Every day | ORAL | 6 refills | Status: DC

## 2016-06-11 NOTE — Progress Notes (Signed)
Primary Care Physician: Kandice Hams, MD Referring Physician: Chanetta Marshall, NP EP: Dr. Lovena Le Cardiologist: Dr. Everette Rank is a 66 y.o. male with a h/o  hx of systolic CHF in the setting of Dilated CM with an EF 15-20% diagnosed in 02/2016.  R/L heart cath was arranged to evaluate his cardiomyopathy.  This demonstrated mild non-obstructive CAD, mild pulmonary HTN and mildly elevated filling pressures.  He received an  St Jude BiV ICD 05/25/16 by Dr. Lovena Le. He was started on tikosyn x one dose but the cost of the drug was found to be cost prohibitive. Amber Seiler started sotalol 80 mg bid as a outpatient and he was asked to come to the afib clinic after 4 doses. Device interrogated and found SR, no afib. He feels well on sotalol.Qtc 495 ms.  Today, he denies symptoms of palpitations, chest pain, shortness of breath, orthopnea, PND, lower extremity edema, dizziness, presyncope, syncope, or neurologic sequela. The patient is tolerating medications without difficulties and is otherwise without complaint today.   Past Medical History:  Diagnosis Date  . AICD (automatic cardioverter/defibrillator) present 05/25/2016   biv icd  . Arthritis    knee  . Chronic combined systolic and diastolic CHF (congestive heart failure) ([place])    Echo 1/18: Mild conc LVH, EF 15-20, severe diff HK, inf and inf-septal AK, Gr 3 DD, mild to mod MR, severe LAE, mod reduced RVSF, mod RAE, mild TR, PASP 50  . Coronary artery disease involving native coronary artery without angina pectoris 04/17/2016   LHC 1/18: pLCx 30, mLCx 20, mRCA 40, dRCA 20, LVEDP 23, mean RA 8, PA 42/20, PCWP 17  . LBBB (left bundle branch block)   . NICM (nonischemic cardiomyopathy) ([place])    Echo 1/18:  Mild conc LVH, EF 15-20, severe diff HK, inf and inf-septal AK, Gr 3 DD, mild to mod MR, severe LAE, mod reduced RVSF, mod RAE, mild TR, PASP 50  . Other secondary pulmonary hypertension ([place]) 04/17/2016   Past Surgical History:    Procedure Laterality Date  . BIV ICD INSERTION CRT-D N/A 05/25/2016   Procedure: BiV ICD Insertion CRT-D;  Surgeon: [name], MD;  Location: [facility];  Service: Cardiovascular;  Laterality: N/A;  . CARDIAC CATHETERIZATION N/A 03/02/2016   Procedure: Right/Left Heart Cath and Coronary Angiography;  Surgeon: Nelva Bush, MD;  Location: [facility];  Service: Cardiovascular;  Laterality: N/A;    Current Outpatient Prescriptions  Medication Sig Dispense Refill  . atorvastatin (LIPITOR) 40 MG tablet Take 1 tablet (40 mg total) by mouth daily. 90 tablet 3  . carvedilol (COREG) 3.125 MG tablet Take 1 tablet (3.125 mg total) by mouth 2 (two) times daily. 60 tablet 3  . cholecalciferol (VITAMIN D) 1000 units tablet Take 1,000 Units by mouth daily.    . furosemide (LASIX) 40 MG tablet Take 40 mg by mouth daily.  0  . Multiple Vitamin (ONE-A-DAY MENS PO) Take 1 tablet by mouth daily.     . Omega-3 Fatty Acids (OMEGA 3 PO) Take 1 tablet by mouth 2 (two) times daily.     . rivaroxaban (XARELTO) 20 MG TABS tablet Take 1 tablet (20 mg total) by mouth daily with supper. 90 tablet 3  . sacubitril-valsartan (ENTRESTO) 24-26 MG Take 1 tablet by mouth 2 (two) times daily. 180 tablet 1  . sotalol (BETAPACE) 80 MG tablet Take 1 tablet (80 mg total) by mouth 2 (two) times daily. 180 tablet  3   No current facility-administered medications for this encounter.     Allergies  Allergen Reactions  . Aspirin Anaphylaxis and Hives  . Sulfa Antibiotics Anaphylaxis and Hives    Social History   Social History  . Marital status: Married    Spouse name: N/A  . Number of children: N/A  . Years of education: N/A   Occupational History  . Not on file.   Social History Main Topics  . Smoking status: Never Smoker  . Smokeless tobacco: Never Used  . Alcohol use No  . Drug use: No  . Sexual activity: Not on file   Other Topics Concern  . Not on file   Social History Narrative  . No  narrative on file    Family History  Problem Relation Age of Onset  . Hypertension Mother   . Heart disease Mother   . Diabetes Mother   . Diabetes Father   . Hypertension Father   . Cancer Father   . Healthy Sister   . Heart attack Brother   . Heart disease Brother 47    + tobacco  . Healthy Brother     ROS- All systems are reviewed and negative except as per the HPI above  Physical Exam: Vitals:   06/11/16 1456  BP: 106/64  Pulse: 65  Weight: 200 lb (90.7 kg)  Height: 5\' 6"  (1.676 m)   Wt Readings from Last 3 Encounters:  06/11/16 200 lb (90.7 kg)  05/26/16 210 lb 1.6 oz (95.3 kg)  05/08/16 201 lb (91.2 kg)    Labs: Lab Results  Component Value Date   NA 140 06/11/2016   K 3.5 06/11/2016   CL 107 06/11/2016   CO2 25 06/11/2016   GLUCOSE 103 (H) 06/11/2016   BUN 15 06/11/2016   CREATININE 1.07 06/11/2016   CALCIUM 9.5 06/11/2016   MG 1.9 06/11/2016   Lab Results  Component Value Date   INR 1.0 02/24/2016   Lab Results  Component Value Date   CHOL 132 06/06/2016   HDL 60 06/06/2016   LDLCALC 61 06/06/2016   TRIG 53 06/06/2016     GEN- The patient is well appearing, alert and oriented x 3 today.   Head- normocephalic, atraumatic Eyes-  Sclera clear, conjunctiva pink Ears- hearing intact Oropharynx- clear Neck- supple, no JVP Lymph- no cervical lymphadenopathy Lungs- Clear to ausculation bilaterally, normal work of breathing Heart- Regular rate and rhythm, no murmurs, rubs or gallops, PMI not laterally displaced. Pacer site healed. GI- soft, NT, ND, + BS Extremities- no clubbing, cyanosis, or edema MS- no significant deformity or atrophy Skin- no rash or lesion Psych- euthymic mood, full affect Neuro- strength and sensation are intact  EKG- v paced 65 bpm, with occasional AV dual paced complexes, qrs int 144, qtc 495 ms Interrogated by Windle Guard, St Jude rep, reports device functioning normal with no afib identified Epic records  reviewed    Assessment and Plan: 1. Afib Sotalol 80 mg bid started, continue at current dose  qtc stable Appears to be in SR by interrogation Continue on xarelto 20 mg daily bmet/mag today  f/u with Chanetta Marshall NP/Renee Ursery,NP in device clinic in 2 weeks f/u with Dr. Lovena Le 7/19 f/u with [name], [place] 7/9 afib clinic as scheduled

## 2016-06-15 ENCOUNTER — Telehealth: Payer: Self-pay | Admitting: Internal Medicine

## 2016-06-15 NOTE — Telephone Encounter (Signed)
**Note De-Identified Jon Williams Obfuscation** The pt returned his pt assistance forms for Xarelto and Entresto without any income info with them. He was advised that he must provide this info before we can fax forms to [employer] for his [name] and [name] for his Xarelto. He states that he will bring that info to the office on Monday (May 7) and he is aware to let us know if he cannot provide his income info to let us know so we can let Dr Lovena Le know as he may want to change to something else.

## 2016-06-15 NOTE — Telephone Encounter (Signed)
Pt calling to get samples of Xarelto and Estresto-pls call

## 2016-06-15 NOTE — Telephone Encounter (Signed)
XARELTO  20 MG AND  ENTRESTO  24/26 SAMPLES  LEFT AT  FRONT  DESK PER PT  DROPPING OFF   MEDICINE ASSISTANCE FORMS  TO BE PROCESSED AS  WELL ./CY

## 2016-06-20 ENCOUNTER — Other Ambulatory Visit: Payer: Self-pay | Admitting: Cardiology

## 2016-06-20 ENCOUNTER — Telehealth: Payer: Self-pay | Admitting: Physician Assistant

## 2016-06-20 MED ORDER — CARVEDILOL 3.125 MG PO TABS: 3.1250 mg | ORAL_TABLET | Freq: Two times a day (BID) | ORAL | 3 refills | Status: DC

## 2016-06-20 NOTE — Telephone Encounter (Signed)
New message      *STAT* If patient is at the pharmacy, call can be transferred to refill team.   1. Which medications need to be refilled? (please list name of each medication and dose if known) carvedilol 3.125  2. Which pharmacy/location (including street and city if local pharmacy) is medication to be sent to?  [facility] [address]. Do they need a 30 day or 90 day supply? Enough to last until his next appt in july

## 2016-06-25 ENCOUNTER — Telehealth: Payer: Self-pay | Admitting: Internal Medicine

## 2016-06-25 NOTE — Telephone Encounter (Signed)
Walk In Pt Form-W2 dropped off. Gave to [employer]

## 2016-06-26 ENCOUNTER — Other Ambulatory Visit: Payer: Self-pay

## 2016-06-26 MED ORDER — SACUBITRIL-VALSARTAN 24-26 MG PO TABS: 1.0000 | ORAL_TABLET | Freq: Two times a day (BID) | ORAL | 3 refills | Status: DC

## 2016-06-26 NOTE — Telephone Encounter (Signed)
**Note De-Identified Markese Bloxham Obfuscation** The pt returned his part of his pt assistance forms for Xarelto and Entresto. I have faxed all required documents to [facility] for pt assistance for Xarelto. Also, I have faxed all required documents to [employer] for pt assistance for [employer].  Awaiting response.

## 2016-06-27 ENCOUNTER — Encounter: Payer: Self-pay | Admitting: Podiatry

## 2016-06-27 ENCOUNTER — Ambulatory Visit (INDEPENDENT_AMBULATORY_CARE_PROVIDER_SITE_OTHER): Payer: Medicare HMO | Admitting: Podiatry

## 2016-06-27 DIAGNOSIS — M79676 Pain in unspecified toe(s): Secondary | ICD-10-CM | POA: Diagnosis not present

## 2016-06-27 DIAGNOSIS — B351 Tinea unguium: Secondary | ICD-10-CM | POA: Diagnosis not present

## 2016-06-27 DIAGNOSIS — L84 Corns and callosities: Secondary | ICD-10-CM

## 2016-06-27 DIAGNOSIS — L97511 Non-pressure chronic ulcer of other part of right foot limited to breakdown of skin: Secondary | ICD-10-CM | POA: Diagnosis not present

## 2016-06-27 DIAGNOSIS — M79606 Pain in leg, unspecified: Secondary | ICD-10-CM

## 2016-06-27 NOTE — Telephone Encounter (Signed)
**Note De-Identified Blaiden Werth Obfuscation** Received a fax from [facility] stating that they have been informed that the PA for this pt has been approved by their insurance provider on 05/02/16.   The fax states that they have referred the pt to the [employer] for financial assistance for [place].  Per fax a 30 day supply at retail =$45 Mail order=$131.

## 2016-06-27 NOTE — Progress Notes (Signed)
Patient ID: Jon Williams, male   DOB: 19-Jun-1950, 66 y.o.   MRN: [id number] Complaint:  Visit Type: Patient returns to my office for continued preventative foot care services. Complaint: Patient states" my nails have grown long and thick and become painful to walk and wear shoes" . The patient presents for preventative foot care services. No changes to ROS.  He states he develops severe callus between his first and second toes but he has been working in it and it has improved..  Podiatric Exam: Vascular: dorsalis pedis and posterior tibial pulses are palpable bilateral. Capillary return is immediate. Temperature gradient is WNL. Skin turgor WNL  Sensorium: Normal Semmes Weinstein monofilament test. Normal tactile sensation bilaterally. Nail Exam: Pt has thick disfigured discolored nails with subungual debris noted bilateral entire nail hallux through fifth toenails Ulcer Exam: There is no evidence of ulcer or pre-ulcerative changes or infection. Orthopedic Exam: Muscle tone and strength are WNL. No limitations in general ROM. No crepitus or effusions noted. Foot type and digits show no abnormalities. Bony prominences are unremarkable. 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 Skin: No Porokeratosis. No infection or ulcer.   callus noted 1/2  B/L.  Diagnosis:  Onychomycosis, , Pain in right toe, pain in left toes  Callus 1/2  B/L   Gardiner Barefoot DPM Treatment & Plan Procedures and Treatment: Consent by patient was obtained for treatment procedures. The patient understood the discussion of treatment and procedures well. All questions were answered thoroughly reviewed. Debridement of mycotic and hypertrophic toenails, 1 through 5 bilateral and clearing of subungual debris. No ulceration, no infection noted.  Debride corn/ulcer right first interspace. Return Visit-Office Procedure: Patient instructed to return to the office for a follow up visit 9 weeks. for continued evaluation and  treatment.   Gardiner Barefoot DPM

## 2016-07-02 ENCOUNTER — Telehealth: Payer: Self-pay | Admitting: Internal Medicine

## 2016-07-02 DIAGNOSIS — R972 Elevated prostate specific antigen [PSA]: Secondary | ICD-10-CM | POA: Diagnosis not present

## 2016-07-02 NOTE — Telephone Encounter (Signed)
Jon Williams is calling to find out about the assistance on his Xarelto and Entresto . Please call .Marland Kitchen Thanks

## 2016-07-02 NOTE — Telephone Encounter (Signed)
Pt is aware that I am forwarding to Prior Authorization to follow up with him tomorrow.

## 2016-07-03 NOTE — Telephone Encounter (Signed)
Follow up   Pt is calling asking to speak with Jeani Hawking.

## 2016-07-03 NOTE — Telephone Encounter (Signed)
The pt has been given the phone # to reach [place] and [name] to check on his Xarelto pt assistance.

## 2016-07-03 NOTE — Telephone Encounter (Signed)
**Note De-Identified Jon Williams Obfuscation** The pt is advised that he has been turned down for pt assistance for Entresto and that [employer] sent [place] a fax stating that they sent him a letter referring him to the [employer] foundation.  He states that he received a letter from them over the weekend and will read it when he gets home.  Also he is advised to call the number on the pt assistance forms he filled out for Xarelto ([place] find out where they are in the process of approving or denying his pt assistance for Xarelto.  He verbalized understanding.

## 2016-07-04 NOTE — Telephone Encounter (Signed)
Follow up    Pt is calling to speak with Jeani Hawking.

## 2016-07-04 NOTE — Telephone Encounter (Signed)
The pt is requesting samples of Entresto and Xarelto until we hear back from pt assistances on his Xarelto and tier exception on Entresto. He is advised that I have left samples of both medications at our front office for him to pick up.  He states he is on his way to pick them up now.

## 2016-07-04 NOTE — Telephone Encounter (Signed)
**Note De-Identified Jon Williams Obfuscation** The pt is calling stating that he went to pick up his Entresto from his pharmacy and that it is too expensive for him to afford. He was advised yesterday that he did not qualify for pt assistance for his [name] as he has insurance and made too much money last year He states that he is going to call his insurance company to see if he has any other options. I have advised him to call me back with update if there is anything we can do.  In the meantime I am going to attempt to do a tier exception on his Entresto.

## 2016-07-04 NOTE — Telephone Encounter (Signed)
Follow Up Call  Jon Williams is asking that you give him a call please .Marland Kitchen   Thanks

## 2016-07-05 ENCOUNTER — Ambulatory Visit (INDEPENDENT_AMBULATORY_CARE_PROVIDER_SITE_OTHER): Payer: Medicare HMO | Admitting: Physician Assistant

## 2016-07-05 VITALS — BP 126/70 | HR 72 | Ht 66.0 in | Wt 202.0 lb

## 2016-07-05 DIAGNOSIS — I4891 Unspecified atrial fibrillation: Secondary | ICD-10-CM

## 2016-07-05 DIAGNOSIS — I5042 Chronic combined systolic (congestive) and diastolic (congestive) heart failure: Secondary | ICD-10-CM | POA: Diagnosis not present

## 2016-07-05 DIAGNOSIS — I481 Persistent atrial fibrillation: Secondary | ICD-10-CM

## 2016-07-05 DIAGNOSIS — I4819 Other persistent atrial fibrillation: Secondary | ICD-10-CM

## 2016-07-05 DIAGNOSIS — Z79899 Other long term (current) drug therapy: Secondary | ICD-10-CM

## 2016-07-05 NOTE — Progress Notes (Addendum)
Cardiology Office Note Date:  07/05/2016  Patient ID:  Brekken, [place] 27-Nov-1950, MRN [id number] PCP:  [name], MD  Cardiologist:  Dr. [name] Electrophysiologist: Dr. Lovena Le    Chief Complaint: device visit, f/u sotalol initiation  History of Present Illness: KEVAN PROUTY is a 66 y.o. male with history of NICM w/ICD, persistent Afib, LBBB comes in today to be seen for Dr. Lovena Le, last seen by him at the hospiatal stay for his device implant, was decided to start Tikosyn during that visit though cost was prohibative and sotalol was started.  He saw AFib clinic in f/u 06/11/16, was in SR and QTc was stable, planned for this f/u today.   He comes in today accompanied by his wife, he is feeling very well.  He denies any kind of CP, palpitations or SOB, no dizziness, near syncope or syncope.  He is walking 3-3.5 miles per day with good exertional tolerance.  No pain at his ICD site.  Device information: SJM CRT-D implanted 05/25/16, Dr. Lovena Le, primary prevention AFib Hx Tikosyn unable to afford, stopped after 1st dose Sotalol 05/2016   Past Medical History:  Diagnosis Date  . AICD (automatic cardioverter/defibrillator) present 05/25/2016   biv icd  . Arthritis    knee  . Chronic combined systolic and diastolic CHF (congestive heart failure) ([place])    Echo 1/18: Mild conc LVH, EF 15-20, severe diff HK, inf and inf-septal AK, Gr 3 DD, mild to mod MR, severe LAE, mod reduced RVSF, mod RAE, mild TR, PASP 50  . Coronary artery disease involving native coronary artery without angina pectoris 04/17/2016   LHC 1/18: pLCx 30, mLCx 20, mRCA 40, dRCA 20, LVEDP 23, mean RA 8, PA 42/20, PCWP 17  . LBBB (left bundle branch block)   . NICM (nonischemic cardiomyopathy) ([place])    Echo 1/18:  Mild conc LVH, EF 15-20, severe diff HK, inf and inf-septal AK, Gr 3 DD, mild to mod MR, severe LAE, mod reduced RVSF, mod RAE, mild TR, PASP 50  . Other secondary pulmonary hypertension ([place]) 04/17/2016     Past Surgical History:  Procedure Laterality Date  . BIV ICD INSERTION CRT-D N/A 05/25/2016   Procedure: BiV ICD Insertion CRT-D;  Surgeon: [name], MD;  Location: [facility];  Service: Cardiovascular;  Laterality: N/A;  . CARDIAC CATHETERIZATION N/A 03/02/2016   Procedure: Right/Left Heart Cath and Coronary Angiography;  Surgeon: Nelva Bush, MD;  Location: [facility];  Service: Cardiovascular;  Laterality: N/A;    Current Outpatient Prescriptions  Medication Sig Dispense Refill  . atorvastatin (LIPITOR) 40 MG tablet Take 1 tablet (40 mg total) by mouth daily. 90 tablet 3  . carvedilol (COREG) 3.125 MG tablet Take 1 tablet (3.125 mg total) by mouth 2 (two) times daily. 180 tablet 3  . cholecalciferol (VITAMIN D) 1000 units tablet Take 1,000 Units by mouth daily.    . furosemide (LASIX) 40 MG tablet Take 40 mg by mouth daily.  0  . Multiple Vitamin (ONE-A-DAY MENS PO) Take 1 tablet by mouth daily.     . Omega-3 Fatty Acids (OMEGA 3 PO) Take 1 tablet by mouth 2 (two) times daily.     . potassium chloride (K-DUR) 10 MEQ tablet Take 1 tablet (10 mEq total) by mouth daily. 30 tablet 6  . rivaroxaban (XARELTO) 20 MG TABS tablet Take 1 tablet (20 mg total) by mouth daily with supper. 90 tablet 3  . sacubitril-valsartan (ENTRESTO) 24-26 MG  Take 1 tablet by mouth 2 (two) times daily. 180 tablet 3  . sotalol (BETAPACE) 80 MG tablet Take 1 tablet (80 mg total) by mouth 2 (two) times daily. 180 tablet 3   No current facility-administered medications for this visit.     Allergies:   Aspirin and Sulfa antibiotics   Social History:  The patient  reports that he has never smoked. He has never used smokeless tobacco. He reports that he does not drink alcohol or use drugs.   Family History:  The patient's family history includes Cancer in his father; Diabetes in his father and mother; Healthy in his brother and sister; Heart attack in his brother; Heart disease in his  mother; Heart disease (age of onset: 4) in his brother; Hypertension in his father and mother.  ROS:  Please see the history of present illness.  All other systems are reviewed and otherwise negative.   PHYSICAL EXAM:  VS:  BP 126/70   Pulse 72   Ht 5\' 6"  (1.676 m)   Wt 202 lb (91.6 kg)   BMI 32.60 kg/m  BMI: Body mass index is 32.6 kg/m. Well nourished, well developed, in no acute distress  HEENT: normocephalic, atraumatic  Neck: no JVD, carotid bruits or masses Cardiac:  RRR (paced); no significant murmurs, no rubs, or gallops Lungs:   CTA b/l, no wheezing, rhonchi or rales  Abd: soft, nontender MS: no deformity or atrophy Ext: no edema  Skin: warm and dry, no rash Neuro:  No gross deficits appreciated Psych: euthymic mood, full affect  ICD site is well healed, stable, no tethering or discomfort   EKG:  Done today shows AFlutter, V paced, QT 400-458ms/corrected 438-460ms paced QRS is 138ms ICD interrogation done today by industry and reviewed by myself: battery and lead measurements are good, AFlutter/Afib looks started 06/14/16, no V events, 95% BV paced  05/02/16: TTE Study Conclusions - Procedure narrative: Limited study. Transthoracic   echocardiography for left ventricular function evaluation. Image   quality was adequate. - Left ventricle: The cavity size was moderately dilated. Wall   thickness was increased in a pattern of mild LVH. Systolic   function was severely reduced. The estimated ejection fraction   was 15%. LV apical false tendon - no mural thrombus noted.   Diffuse hypokinesis Impressions: - Compared to a prior study in 02/2016, the LVEF remains at 15-20%.  03/02/16: LHC Conclusions: 1. Mild to moderate, non-obstructive coronary artery disease consistent with non-ischemic cardiomyopathy. 2. Upper normal to mildly elevated left and right heart filling pressures. 3. Mild pulmonary hypertension. 4. Reduced Fick cardiac output/index.  Recent  Labs: 02/08/2016: B Natriuretic Peptide 705.1 02/15/2016: TSH 1.770 05/25/2016: Hemoglobin 13.1; Platelets 187 06/06/2016: ALT 41 06/11/2016: BUN 15; Creatinine, Ser 1.07; Magnesium 1.9; Potassium 3.5; Sodium 140  06/06/2016: Chol/HDL Ratio 2.2; Cholesterol, Total 132; HDL 60; LDL Calculated 61; Triglycerides 53   CrCl cannot be calculated (Patient's most recent lab result is older than the maximum 21 days allowed.).   Wt Readings from Last 3 Encounters:  07/05/16 202 lb (91.6 kg)  06/11/16 200 lb (90.7 kg)  05/26/16 210 lb 1.6 oz (95.3 kg)     Other studies reviewed: Additional studies/records reviewed today include: summarized above  ASSESSMENT AND PLAN:  1. NICM w/ICD     Weight and exam are stable, no CorVue data yet     On BB/Entresto, lasix/K+  2. CRT-D     Implant out[puts remain until 3 month f/u  3. AFlutter/fib  CHA2DS2Vasc is at least 2, on Xarelto     He reports no missed doses for at least 3 weeks, (longer)     On sotalol 80mg  day, QT stable,the patient was unaware he had gone back into AF     I will review with Dr. Lovena Le, DCCV vs/+ up-titration of his sotalol (out patient), Creat Clearance is 86    Disposition: Dr. Lovena Le at his already scheduled visit.  I will call the patient after I discuss rhythm management options/plan with dr. Lovena Le, the patient is reminded not to miss his xareto.  It is financially difficult for him, bus hasn't missed any, Jeani Hawking here in the office is helping him with financial assistance program paperwork/process, I gave him names of Eliquis and Pradaxa to see if either of these would be covered better, if so to let us know.  The patient left without ordering labs, I was able to catch him and he had just left will come back for BMET/Mag level to be drawn today.  Current medicines are reviewed at length with the patient today.  The patient did not have any concerns regarding medicines.  Haywood Lasso, PA-C 07/05/2016 4:04 PM      [address] [place] [address] [phone] (office)  [phone] (fax)

## 2016-07-05 NOTE — Patient Instructions (Addendum)
Medication Instructions:   Your physician recommends that you continue on your current medications as directed. Please refer to the Current Medication list given to you today.   If you need a refill on your cardiac medications before your next appointment, please call your pharmacy.  Labwork: NONE ORDERED  TODAY    Testing/Procedures: NONE ORDERED  TODAY    Follow-Up:   KEEP APPOINTMENT AS SCHEDULED    Any Other Special Instructions Will Be Listed Below (If Applicable).                                                                                                                                                   

## 2016-07-06 ENCOUNTER — Other Ambulatory Visit: Payer: Self-pay | Admitting: *Deleted

## 2016-07-06 DIAGNOSIS — N289 Disorder of kidney and ureter, unspecified: Secondary | ICD-10-CM

## 2016-07-06 LAB — BASIC METABOLIC PANEL
BUN/Creatinine Ratio: 15 (ref 10–24)
BUN: 25 mg/dL (ref 8–27)
CO2: 23 mmol/L (ref 18–29)
Calcium: 9.9 mg/dL (ref 8.6–10.2)
Chloride: 106 mmol/L (ref 96–106)
Creatinine, Ser: 1.66 mg/dL — ABNORMAL HIGH (ref 0.76–1.27)
GFR calc Af Amer: 49 mL/min/{1.73_m2} — ABNORMAL LOW (ref >59)
GFR calc non Af Amer: 42 mL/min/{1.73_m2} — ABNORMAL LOW (ref >59)
Glucose: 107 mg/dL — ABNORMAL HIGH (ref 65–99)
Potassium: 4.1 mmol/L (ref 3.5–5.2)
Sodium: 145 mmol/L — ABNORMAL HIGH (ref 134–144)

## 2016-07-06 LAB — MAGNESIUM: Magnesium: 2.4 mg/dL — ABNORMAL HIGH (ref 1.6–2.3)

## 2016-07-10 ENCOUNTER — Other Ambulatory Visit: Payer: Self-pay | Admitting: Internal Medicine

## 2016-07-10 ENCOUNTER — Other Ambulatory Visit: Payer: Medicare HMO

## 2016-07-10 ENCOUNTER — Telehealth: Payer: Self-pay

## 2016-07-10 DIAGNOSIS — N289 Disorder of kidney and ureter, unspecified: Secondary | ICD-10-CM | POA: Diagnosis not present

## 2016-07-10 LAB — BASIC METABOLIC PANEL
BUN/Creatinine Ratio: 14 (ref 10–24)
BUN: 15 mg/dL (ref 8–27)
CO2: 21 mmol/L (ref 18–29)
Calcium: 9.6 mg/dL (ref 8.6–10.2)
Chloride: 103 mmol/L (ref 96–106)
Creatinine, Ser: 1.04 mg/dL (ref 0.76–1.27)
GFR calc Af Amer: 85 mL/min/{1.73_m2} (ref >59)
GFR calc non Af Amer: 74 mL/min/{1.73_m2} (ref >59)
Glucose: 130 mg/dL — ABNORMAL HIGH (ref 65–99)
Potassium: 3.8 mmol/L (ref 3.5–5.2)
Sodium: 141 mmol/L (ref 134–144)

## 2016-07-10 NOTE — Telephone Encounter (Signed)
SPOKE TO PATIENT TO LET HIM KNOW LAB RESULTS  NOT RESULTED YET AND TO CONTINUE TO TAKE 20 MG OF LASIX UNTIL GIVEN FURTHER INSTRUCTONS ONCE GIVEN A PHONE CALL BACK.

## 2016-07-10 NOTE — Telephone Encounter (Signed)
**Note De-Identified [name] Obfuscation** The pt needs to speak with Carolan Clines nurse concerning Lasix and labs. Please call.

## 2016-07-11 ENCOUNTER — Telehealth: Payer: Self-pay | Admitting: Physician Assistant

## 2016-07-11 NOTE — Telephone Encounter (Signed)
I called and spoke with the patient.  He is feeling well, has not noted any weight gain swelling, SOB/DOE or change at all on the reduced lasix dose.  Discussed his labs with the patient, renal function is back wnl.  His K+ is wnl, but prefer at 4.0 for his sotalol. And today will take an extra tab of his potassium as a one time extra dose given lower lasix will not continue higher dose.  I had previously reviewed the case with Dr. Lovena Le, at my visit with the patient last week, he was in AFlutter (Vpaced, rate controlled), Dr. Lovena Le was OK with out patient up-titration of his Sotalol to 120mg  BID dose with close f/u on his EKG.   Given his lab results though and decline in renal function, I elected not to make the dose change.  His follow up BMET this week renal function is better, I have asked the patient to take an extra K+ dose as above and remain on lower dose of lasix.  He is instructed to continue daily weights, monitor for weight gain, fluid retention, symptoms of fluid OL.  Recommend he see AF clinic next week to re-evaluate.    AF clinic RN will call the patient to find a date/time that works for him.  Tommye Standard, PA-C

## 2016-07-13 ENCOUNTER — Other Ambulatory Visit (HOSPITAL_COMMUNITY): Payer: Self-pay | Admitting: *Deleted

## 2016-07-13 ENCOUNTER — Ambulatory Visit (HOSPITAL_COMMUNITY)
Admission: RE | Admit: 2016-07-13 | Discharge: 2016-07-13 | Disposition: A | Discharge status: Home / Self Care | Payer: Medicare HMO | Source: Ambulatory Visit | Attending: Nurse Practitioner | Admitting: Nurse Practitioner

## 2016-07-13 ENCOUNTER — Encounter (HOSPITAL_COMMUNITY): Payer: Self-pay | Admitting: Nurse Practitioner

## 2016-07-13 DIAGNOSIS — Z79899 Other long term (current) drug therapy: Secondary | ICD-10-CM | POA: Insufficient documentation

## 2016-07-13 DIAGNOSIS — Z7901 Long term (current) use of anticoagulants: Secondary | ICD-10-CM | POA: Diagnosis not present

## 2016-07-13 DIAGNOSIS — I5042 Chronic combined systolic (congestive) and diastolic (congestive) heart failure: Secondary | ICD-10-CM | POA: Insufficient documentation

## 2016-07-13 DIAGNOSIS — I272 Pulmonary hypertension, unspecified: Secondary | ICD-10-CM | POA: Insufficient documentation

## 2016-07-13 DIAGNOSIS — I420 Dilated cardiomyopathy: Secondary | ICD-10-CM | POA: Insufficient documentation

## 2016-07-13 DIAGNOSIS — Z950 Presence of cardiac pacemaker: Secondary | ICD-10-CM | POA: Insufficient documentation

## 2016-07-13 DIAGNOSIS — I2510 Atherosclerotic heart disease of native coronary artery without angina pectoris: Secondary | ICD-10-CM | POA: Insufficient documentation

## 2016-07-13 DIAGNOSIS — I4891 Unspecified atrial fibrillation: Secondary | ICD-10-CM

## 2016-07-13 LAB — BASIC METABOLIC PANEL
Anion gap: 8 (ref 5–15)
BUN: 24 mg/dL — ABNORMAL HIGH (ref 6–20)
CO2: 25 mmol/L (ref 22–32)
Calcium: 9.6 mg/dL (ref 8.9–10.3)
Chloride: 105 mmol/L (ref 101–111)
Creatinine, Ser: 1.18 mg/dL (ref 0.61–1.24)
GFR calc Af Amer: >60 mL/min (ref >60)
GFR calc non Af Amer: >60 mL/min (ref >60)
Glucose, Bld: 135 mg/dL — ABNORMAL HIGH (ref 65–99)
Potassium: 3.9 mmol/L (ref 3.5–5.1)
Sodium: 138 mmol/L (ref 135–145)

## 2016-07-13 LAB — CBC
HCT: 39.5 % (ref 39.0–52.0)
Hemoglobin: 13.5 g/dL (ref 13.0–17.0)
MCH: 31.5 pg (ref 26.0–34.0)
MCHC: 34.2 g/dL (ref 30.0–36.0)
MCV: 92.3 fL (ref 78.0–100.0)
Platelets: 179 10*3/uL (ref 150–400)
RBC: 4.28 MIL/uL (ref 4.22–5.81)
RDW: 13.3 % (ref 11.5–15.5)
WBC: 7 10*3/uL (ref 4.0–10.5)

## 2016-07-13 LAB — MAGNESIUM: Magnesium: 2.1 mg/dL (ref 1.7–2.4)

## 2016-07-13 MED ORDER — POTASSIUM CHLORIDE ER 10 MEQ PO TBCR: 20.0000 meq | EXTENDED_RELEASE_TABLET | Freq: Every day | ORAL | 6 refills | Status: DC

## 2016-07-13 MED ORDER — SOTALOL HCL 80 MG PO TABS: 120.0000 mg | ORAL_TABLET | Freq: Two times a day (BID) | ORAL | 3 refills | Status: DC

## 2016-07-13 NOTE — Progress Notes (Addendum)
Primary Care Physician: [name], MD Referring Physician: Chanetta Marshall, NP EP: Dr. Lovena Le Cardiologist: Dr. Everette Rank is a 66 y.o. male with a h/o  hx of systolic CHF in the setting of Dilated CM with an EF 15-20% diagnosed in 02/2016.  R/L heart cath was arranged to evaluate his cardiomyopathy.  This demonstrated mild non-obstructive CAD, mild pulmonary HTN and mildly elevated filling pressures.  He received an  [employer] Jude BiV ICD 05/25/16 by Dr. Lovena Le. He was started on tikosyn x one dose but the cost of the drug was found to be cost prohibitive. Amber Seiler started sotalol 80 mg bid as a outpatient and he was asked to come to the afib clinic after 4 doses. Device interrogated and found SR, no afib. He feels well on sotalol.Qtc 495 ms.  F/u in afib clinic 6/1. Pt was seen by Loyal Jacobson, PA last week and pt was in afib. He had talked to Dr. Lovena Le and was given ok to increase sotalol to 120 mg bid but creatinine had increased so she did not feel comfortable increasing at that time. She told pt to decrease lasix and repeated bmet which looked normal. Upon discussing with pt today, he did not decrease lasix but decreased lipitor. Bmet today looks of. Interrogation of device shows persistent afib x 4 weeks. Pt does not notice clinically.  Today, he denies symptoms of palpitations, chest pain, shortness of breath, orthopnea, PND, lower extremity edema, dizziness, presyncope, syncope, or neurologic sequela. The patient is tolerating medications without difficulties and is otherwise without complaint today.   Past Medical History:  Diagnosis Date  . AICD (automatic cardioverter/defibrillator) present 05/25/2016   biv icd  . Arthritis    knee  . Chronic combined systolic and diastolic CHF (congestive heart failure) ([place])    Echo 1/18: Mild conc LVH, EF 15-20, severe diff HK, inf and inf-septal AK, Gr 3 DD, mild to mod MR, severe LAE, mod reduced RVSF, mod RAE, mild TR, PASP 50    . Coronary artery disease involving native coronary artery without angina pectoris 04/17/2016   LHC 1/18: pLCx 30, mLCx 20, mRCA 40, dRCA 20, LVEDP 23, mean RA 8, PA 42/20, PCWP 17  . LBBB (left bundle branch block)   . NICM (nonischemic cardiomyopathy) ([place])    Echo 1/18:  Mild conc LVH, EF 15-20, severe diff HK, inf and inf-septal AK, Gr 3 DD, mild to mod MR, severe LAE, mod reduced RVSF, mod RAE, mild TR, PASP 50  . Other secondary pulmonary hypertension ([place]) 04/17/2016   Past Surgical History:  Procedure Laterality Date  . BIV ICD INSERTION CRT-D N/A 05/25/2016   Procedure: BiV ICD Insertion CRT-D;  Surgeon: [name], MD;  Location: [facility];  Service: Cardiovascular;  Laterality: N/A;  . CARDIAC CATHETERIZATION N/A 03/02/2016   Procedure: Right/Left Heart Cath and Coronary Angiography;  Surgeon: Nelva Bush, MD;  Location: [facility];  Service: Cardiovascular;  Laterality: N/A;    Current Outpatient Prescriptions  Medication Sig Dispense Refill  . atorvastatin (LIPITOR) 40 MG tablet Take 1 tablet (40 mg total) by mouth daily. (Patient taking differently: Take 20 mg by mouth daily. ) 90 tablet 3  . carvedilol (COREG) 3.125 MG tablet Take 1 tablet (3.125 mg total) by mouth 2 (two) times daily. 180 tablet 3  . cholecalciferol (VITAMIN D) 1000 units tablet Take 1,000 Units by mouth daily.    . furosemide (LASIX) 40 MG tablet Take  40 mg by mouth 2 (two) times daily.   0  . Multiple Vitamin (ONE-A-DAY MENS PO) Take 1 tablet by mouth daily.     . Omega-3 Fatty Acids (OMEGA 3 PO) Take 1 tablet by mouth 2 (two) times daily.     . potassium chloride (K-DUR) 10 MEQ tablet Take 1 tablet (10 mEq total) by mouth daily. 30 tablet 6  . rivaroxaban (XARELTO) 20 MG TABS tablet Take 1 tablet (20 mg total) by mouth daily with supper. 90 tablet 3  . sacubitril-valsartan (ENTRESTO) 24-26 MG Take 1 tablet by mouth 2 (two) times daily. 180 tablet 3  . sotalol (BETAPACE) 80 MG tablet  Take 1.5 tablets (120 mg total) by mouth 2 (two) times daily. 180 tablet 3   No current facility-administered medications for this encounter.     Allergies  Allergen Reactions  . Aspirin Anaphylaxis and Hives  . Sulfa Antibiotics Anaphylaxis and Hives    Social History   Social History  . Marital status: Married    Spouse name: N/A  . Number of children: N/A  . Years of education: N/A   Occupational History  . Not on file.   Social History Main Topics  . Smoking status: Never Smoker  . Smokeless tobacco: Never Used  . Alcohol use No  . Drug use: No  . Sexual activity: Not on file   Other Topics Concern  . Not on file   Social History Narrative  . No narrative on file    Family History  Problem Relation Age of Onset  . Hypertension Mother   . Heart disease Mother   . Diabetes Mother   . Diabetes Father   . Hypertension Father   . Cancer Father   . Healthy Sister   . Heart attack Brother   . Heart disease Brother 42       + tobacco  . Healthy Brother     ROS- All systems are reviewed and negative except as per the HPI above  Physical Exam: Vitals:   07/13/16 1107  BP: 92/68  Pulse: 70  Weight: 207 lb 9.6 oz (94.2 kg)  Height: 5\' 6"  (1.676 m)   Wt Readings from Last 3 Encounters:  07/13/16 207 lb 9.6 oz (94.2 kg)  07/05/16 202 lb (91.6 kg)  06/11/16 200 lb (90.7 kg)    Labs: Lab Results  Component Value Date   NA 138 07/13/2016   K 3.9 07/13/2016   CL 105 07/13/2016   CO2 25 07/13/2016   GLUCOSE 135 (H) 07/13/2016   BUN 24 (H) 07/13/2016   CREATININE 1.18 07/13/2016   CALCIUM 9.6 07/13/2016   MG 2.1 07/13/2016   Lab Results  Component Value Date   INR 1.0 02/24/2016   Lab Results  Component Value Date   CHOL 132 06/06/2016   HDL 60 06/06/2016   LDLCALC 61 06/06/2016   TRIG 53 06/06/2016     GEN- The patient is well appearing, alert and oriented x 3 today.   Head- normocephalic, atraumatic Eyes-  Sclera clear, conjunctiva  pink Ears- hearing intact Oropharynx- clear Neck- supple, no JVP Lymph- no cervical lymphadenopathy Lungs- Clear to ausculation bilaterally, normal work of breathing Heart- Regular rate and rhythm, no murmurs, rubs or gallops, PMI not laterally displaced. Pacer site healed. GI- soft, NT, ND, + BS Extremities- no clubbing, cyanosis, or edema MS- no significant deformity or atrophy Skin- no rash or lesion Psych- euthymic mood, full affect Neuro- strength and sensation are intact  EKG- v paced 70 bpm, with biventricular pacemaker noted, qrs int 138 ms, qtc 477 ms Interrogated by Dyanne Iha Jude rep, reports device functioning normal with 4 weeks afib. Impedance feature was turned off and this was turned on today.  Epic records reviewed    Assessment and Plan: 1. Afib After discussion with Dr. Lovena Le, he will increase sotalol to 120 mg bid Pt would benefit being in SR with cardiomyopathy although he is not clinically symptomatic with afib He will return to afib clinic on Monday and if he is still in afib, he will be cardioverted by Dr. Lovena Le next week qtc stable Continue on xarelto 20 mg daily, stressed no missed doses Continue with usual lasix dose( he did not lower dose before) Go back to usual lipitor dose bmet/mag today   f/u with Dr. Lovena Le 7/19 f/u with [name], [place] 7/9 afib clinic as scheduled   Addendum:6/4- f/u in afib clinic with EKG and interrogation with increase of stoalol to 120 mg bid 6/1. By interrogation, he remains in aflutter and will have cardioversion in the am with Dr. Lovena Le. No missed doses of xarelto x at least 3 weeks.  Geroge Baseman Israella Hubert, [facility] [address] [place], [place] [address] [phone]

## 2016-07-13 NOTE — Patient Instructions (Signed)
Your physician has recommended you make the following change in your medication:  1)Increase sotalol to 120mg  twice a day (1 and 1/2 tablets twice a day of your 80mg  tablet)

## 2016-07-16 ENCOUNTER — Inpatient Hospital Stay (HOSPITAL_COMMUNITY): Admission: RE | Admit: 2016-07-16 | Payer: Medicare HMO | Source: Ambulatory Visit | Admitting: Nurse Practitioner

## 2016-07-16 ENCOUNTER — Ambulatory Visit (HOSPITAL_COMMUNITY)
Admission: RE | Admit: 2016-07-16 | Discharge: 2016-07-16 | Disposition: A | Discharge status: Home / Self Care | Payer: Medicare HMO | Source: Ambulatory Visit | Attending: Nurse Practitioner | Admitting: Nurse Practitioner

## 2016-07-16 DIAGNOSIS — I4892 Unspecified atrial flutter: Secondary | ICD-10-CM | POA: Insufficient documentation

## 2016-07-16 DIAGNOSIS — Z950 Presence of cardiac pacemaker: Secondary | ICD-10-CM | POA: Insufficient documentation

## 2016-07-16 DIAGNOSIS — R9431 Abnormal electrocardiogram [ECG] [EKG]: Secondary | ICD-10-CM | POA: Insufficient documentation

## 2016-07-16 DIAGNOSIS — I4891 Unspecified atrial fibrillation: Secondary | ICD-10-CM | POA: Diagnosis not present

## 2016-07-16 NOTE — Addendum Note (Signed)
Encounter addended by: Sherran Needs, NP on: 07/16/2016  4:30 PM<BR>    Actions taken: Sign clinical note

## 2016-07-16 NOTE — Patient Instructions (Signed)
Cardioversion scheduled for Tuesday, June 5th  - Arrive at the [facility] and go to admitting at 6:30am  -Do not eat or drink anything after midnight the night prior to your procedure.  - Take all your medication with a sip of water prior to arrival.  - You will not be able to drive home after your procedure.

## 2016-07-16 NOTE — Progress Notes (Signed)
Patient ID: Jon Williams, male   DOB: Mar 02, 1950, 66 y.o.   MRN: [id number]  Pt in clinic for f/u of increase in sotalol to 120 mg bid. He remains in aflutter. He is v paced at 70 bpm, qtc 511 ms( ok with ICD in place). Per Dr. Lovena Le, he will be set up for cardioversion. Scheduled for in  the am at 6:30 am with Dr. Lovena Le. No missed doses of xarelto for at least 3 weeks.

## 2016-07-17 ENCOUNTER — Ambulatory Visit (HOSPITAL_COMMUNITY)
Admission: RE | Admit: 2016-07-17 | Discharge: 2016-07-17 | Disposition: A | Discharge status: Home / Self Care | Payer: Medicare HMO | Source: Ambulatory Visit | Attending: Internal Medicine | Admitting: Internal Medicine

## 2016-07-17 ENCOUNTER — Encounter (HOSPITAL_COMMUNITY): Payer: Self-pay | Admitting: Internal Medicine

## 2016-07-17 ENCOUNTER — Encounter (HOSPITAL_COMMUNITY)
Admission: RE | Disposition: A | Discharge status: Home / Self Care | Payer: Self-pay | Source: Ambulatory Visit | Attending: Internal Medicine

## 2016-07-17 DIAGNOSIS — I447 Left bundle-branch block, unspecified: Secondary | ICD-10-CM | POA: Insufficient documentation

## 2016-07-17 DIAGNOSIS — M179 Osteoarthritis of knee, unspecified: Secondary | ICD-10-CM | POA: Insufficient documentation

## 2016-07-17 DIAGNOSIS — Z8249 Family history of ischemic heart disease and other diseases of the circulatory system: Secondary | ICD-10-CM | POA: Insufficient documentation

## 2016-07-17 DIAGNOSIS — Z7901 Long term (current) use of anticoagulants: Secondary | ICD-10-CM | POA: Diagnosis not present

## 2016-07-17 DIAGNOSIS — Z95810 Presence of automatic (implantable) cardiac defibrillator: Secondary | ICD-10-CM | POA: Insufficient documentation

## 2016-07-17 DIAGNOSIS — I4891 Unspecified atrial fibrillation: Secondary | ICD-10-CM | POA: Diagnosis present

## 2016-07-17 DIAGNOSIS — I2510 Atherosclerotic heart disease of native coronary artery without angina pectoris: Secondary | ICD-10-CM | POA: Diagnosis not present

## 2016-07-17 DIAGNOSIS — I4892 Unspecified atrial flutter: Secondary | ICD-10-CM | POA: Insufficient documentation

## 2016-07-17 DIAGNOSIS — I4819 Other persistent atrial fibrillation: Secondary | ICD-10-CM | POA: Diagnosis present

## 2016-07-17 DIAGNOSIS — I481 Persistent atrial fibrillation: Secondary | ICD-10-CM | POA: Diagnosis not present

## 2016-07-17 DIAGNOSIS — I2729 Other secondary pulmonary hypertension: Secondary | ICD-10-CM | POA: Insufficient documentation

## 2016-07-17 DIAGNOSIS — I5042 Chronic combined systolic (congestive) and diastolic (congestive) heart failure: Secondary | ICD-10-CM | POA: Diagnosis not present

## 2016-07-17 DIAGNOSIS — I428 Other cardiomyopathies: Secondary | ICD-10-CM | POA: Insufficient documentation

## 2016-07-17 HISTORY — PX: CARDIOVERSION: EP1203

## 2016-07-17 SURGERY — CARDIOVERSION (CATH LAB)
Anesthesia: LOCAL

## 2016-07-17 MED ORDER — SODIUM CHLORIDE 0.9 % IV SOLN: 250.0000 mL | INTRAVENOUS | Status: DC | PRN

## 2016-07-17 MED ORDER — SODIUM CHLORIDE 0.9% FLUSH: 3.0000 mL | INTRAVENOUS | Status: DC | PRN

## 2016-07-17 MED ORDER — MIDAZOLAM HCL 5 MG/5ML IJ SOLN
INTRAMUSCULAR | Status: AC
  Filled 2016-07-17: qty 5

## 2016-07-17 MED ORDER — ONDANSETRON HCL 4 MG/2ML IJ SOLN: 4.0000 mg | Freq: Four times a day (QID) | INTRAMUSCULAR | Status: DC | PRN

## 2016-07-17 MED ORDER — SODIUM CHLORIDE 0.9 % IV SOLN
INTRAVENOUS | Status: AC | PRN
  Administered 2016-07-17: 50 mL/h via INTRAVENOUS
  Administered 2016-07-17 (×2)

## 2016-07-17 MED ORDER — FENTANYL CITRATE (PF) 100 MCG/2ML IJ SOLN
INTRAMUSCULAR | Status: DC | PRN
Start: 2016-07-17 — End: 2016-07-17
  Administered 2016-07-17: 25 ug via INTRAVENOUS

## 2016-07-17 MED ORDER — ACETAMINOPHEN 325 MG PO TABS: 650.0000 mg | ORAL_TABLET | ORAL | Status: DC | PRN

## 2016-07-17 MED ORDER — FENTANYL CITRATE (PF) 100 MCG/2ML IJ SOLN
INTRAMUSCULAR | Status: AC
  Filled 2016-07-17: qty 2

## 2016-07-17 MED ORDER — SODIUM CHLORIDE 0.9% FLUSH: 3.0000 mL | Freq: Two times a day (BID) | INTRAVENOUS | Status: DC

## 2016-07-17 MED ORDER — MIDAZOLAM HCL 2 MG/2ML IJ SOLN
INTRAMUSCULAR | Status: DC | PRN
  Administered 2016-07-17 (×2): 2 mg via INTRAVENOUS

## 2016-07-17 SURGICAL SUPPLY — 1 items: PAD DEFIB LIFELINK (PAD) ×2 IMPLANT

## 2016-07-17 NOTE — H&P (Signed)
Cardiology Office Note Date:  07/05/2016  Patient ID:  Jon Williams, Jon Williams 09-03-1950, MRN [id number] PCP:  [name], MD        Cardiologist:  Dr. [name] Electrophysiologist: Dr. Lovena Le    Chief Complaint: device visit, f/u sotalol initiation  History of Present Illness: Jon Williams is a 66 y.o. male with history of NICM w/ICD, persistent Afib, LBBB comes in today to be seen for Dr. Lovena Le, last seen by him at the hospiatal stay for his device implant, was decided to start Tikosyn during that visit though cost was prohibative and sotalol was started.  He saw AFib clinic in f/u 06/11/16, was in SR and QTc was stable, planned for this f/u today.   He comes in today accompanied by his wife, he is feeling very well.  He denies any kind of CP, palpitations or SOB, no dizziness, near syncope or syncope.  He is walking 3-3.5 miles per day with good exertional tolerance.  No pain at his ICD site.  Device information: SJM CRT-D implanted 05/25/16, Dr. Lovena Le, primary prevention AFib Hx Tikosyn unable to afford, stopped after 1st dose Sotalol 05/2016       Past Medical History:  Diagnosis Date  . AICD (automatic cardioverter/defibrillator) present 05/25/2016   biv icd  . Arthritis    knee  . Chronic combined systolic and diastolic CHF (congestive heart failure) ([place])    Echo 1/18: Mild conc LVH, EF 15-20, severe diff HK, inf and inf-septal AK, Gr 3 DD, mild to mod MR, severe LAE, mod reduced RVSF, mod RAE, mild TR, PASP 50  . Coronary artery disease involving native coronary artery without angina pectoris 04/17/2016   LHC 1/18: pLCx 30, mLCx 20, mRCA 40, dRCA 20, LVEDP 23, mean RA 8, PA 42/20, PCWP 17  . LBBB (left bundle branch block)   . NICM (nonischemic cardiomyopathy) ([place])    Echo 1/18:  Mild conc LVH, EF 15-20, severe diff HK, inf and inf-septal AK, Gr 3 DD, mild to mod MR, severe LAE, mod reduced RVSF, mod RAE, mild TR, PASP 50  . Other secondary  pulmonary hypertension ([place]) 04/17/2016         Past Surgical History:  Procedure Laterality Date  . BIV ICD INSERTION CRT-D N/A 05/25/2016   Procedure: BiV ICD Insertion CRT-D;  Surgeon: [name], MD;  Location: [facility];  Service: Cardiovascular;  Laterality: N/A;  . CARDIAC CATHETERIZATION N/A 03/02/2016   Procedure: Right/Left Heart Cath and Coronary Angiography;  Surgeon: Nelva Bush, MD;  Location: [facility];  Service: Cardiovascular;  Laterality: N/A;          Current Outpatient Prescriptions  Medication Sig Dispense Refill  . atorvastatin (LIPITOR) 40 MG tablet Take 1 tablet (40 mg total) by mouth daily. 90 tablet 3  . carvedilol (COREG) 3.125 MG tablet Take 1 tablet (3.125 mg total) by mouth 2 (two) times daily. 180 tablet 3  . cholecalciferol (VITAMIN D) 1000 units tablet Take 1,000 Units by mouth daily.    . furosemide (LASIX) 40 MG tablet Take 40 mg by mouth daily.  0  . Multiple Vitamin (ONE-A-DAY MENS PO) Take 1 tablet by mouth daily.     . Omega-3 Fatty Acids (OMEGA 3 PO) Take 1 tablet by mouth 2 (two) times daily.     . potassium chloride (K-DUR) 10 MEQ tablet Take 1 tablet (10 mEq total) by mouth daily. 30 tablet 6  . rivaroxaban (XARELTO) 20  MG TABS tablet Take 1 tablet (20 mg total) by mouth daily with supper. 90 tablet 3  . sacubitril-valsartan (ENTRESTO) 24-26 MG Take 1 tablet by mouth 2 (two) times daily. 180 tablet 3  . sotalol (BETAPACE) 80 MG tablet Take 1 tablet (80 mg total) by mouth 2 (two) times daily. 180 tablet 3   No current facility-administered medications for this visit.     Allergies:   Aspirin and Sulfa antibiotics   Social History:  The patient  reports that he has never smoked. He has never used smokeless tobacco. He reports that he does not drink alcohol or use drugs.   Family History:  The patient's family history includes Cancer in his father; Diabetes in his father and mother; Healthy in his brother  and sister; Heart attack in his brother; Heart disease in his mother; Heart disease (age of onset: 56) in his brother; Hypertension in his father and mother.  ROS:  Please see the history of present illness.  All other systems are reviewed and otherwise negative.   PHYSICAL EXAM:  VS:  BP 126/70   Pulse 72   Ht 5\' 6"  (1.676 m)   Wt 202 lb (91.6 kg)   BMI 32.60 kg/m  BMI: Body mass index is 32.6 kg/m. Well nourished, well developed, in no acute distress  HEENT: normocephalic, atraumatic  Neck: no JVD, carotid bruits or masses Cardiac:  RRR (paced); no significant murmurs, no rubs, or gallops Lungs:   CTA b/l, no wheezing, rhonchi or rales  Abd: soft, nontender MS: no deformity or atrophy Ext: no edema  Skin: warm and dry, no rash Neuro:  No gross deficits appreciated Psych: euthymic mood, full affect  ICD site is well healed, stable, no tethering or discomfort   EKG:  Done today shows AFlutter, V paced, QT 400-445ms/corrected 438-427ms paced QRS is 120ms ICD interrogation done today by industry and reviewed by myself: battery and lead measurements are good, AFlutter/Afib looks started 06/14/16, no V events, 95% BV paced  05/02/16: TTE Study Conclusions - Procedure narrative: Limited study. Transthoracic echocardiography for left ventricular function evaluation. Image quality was adequate. - Left ventricle: The cavity size was moderately dilated. Wall thickness was increased in a pattern of mild LVH. Systolic function was severely reduced. The estimated ejection fraction was 15%. LV apical false tendon - no mural thrombus noted. Diffuse hypokinesis Impressions: - Compared to a prior study in 02/2016, the LVEF remains at 15-20%.  03/02/16: LHC Conclusions: 1. Mild to moderate, non-obstructive coronary artery disease consistent with non-ischemic cardiomyopathy. 2. Upper normal to mildly elevated left and right heart filling pressures. 3. Mild pulmonary  hypertension. 4. Reduced Fick cardiac output/index.  Recent Labs: 02/08/2016: B Natriuretic Peptide 705.1 02/15/2016: TSH 1.770 05/25/2016: Hemoglobin 13.1; Platelets 187 06/06/2016: ALT 41 06/11/2016: BUN 15; Creatinine, Ser 1.07; Magnesium 1.9; Potassium 3.5; Sodium 140  06/06/2016: Chol/HDL Ratio 2.2; Cholesterol, Total 132; HDL 60; LDL Calculated 61; Triglycerides 53   CrCl cannot be calculated (Patient's most recent lab result is older than the maximum 21 days allowed.).      Wt Readings from Last 3 Encounters:  07/05/16 202 lb (91.6 kg)  06/11/16 200 lb (90.7 kg)  05/26/16 210 lb 1.6 oz (95.3 kg)     Other studies reviewed: Additional studies/records reviewed today include: summarized above  ASSESSMENT AND PLAN:  1. NICM w/ICD     Weight and exam are stable, no CorVue data yet     On BB/Entresto, lasix/K+  2. CRT-D  Implant out[puts remain until 3 month f/u  3. AFlutter/fib     CHA2DS2Vasc is at least 2, on Xarelto     He reports no missed doses for at least 3 weeks, (longer)     On sotalol 80mg  day, QT stable,the patient was unaware he had gone back into AF     I will review with Dr. Lovena Le, DCCV vs/+ up-titration of his sotalol (out patient), Creat Clearance is 86    Disposition: Dr. Lovena Le at his already scheduled visit.  I will call the patient after I discuss rhythm management options/plan with dr. Lovena Le, the patient is reminded not to miss his xareto.  It is financially difficult for him, bus hasn't missed any, [employer] Hawking here in the office is helping him with financial assistance program paperwork/process, I gave him names of Eliquis and Pradaxa to see if either of these would be covered better, if so to let us know.  The patient left without ordering labs, I was able to catch him and he had just left will come back for BMET/Mag level to be drawn today.  Current medicines are reviewed at length with the patient today.  The patient did not have any  concerns regarding medicines.  Haywood Lasso, PA-C 07/05/2016 4:04 PM     EP Attending  Patient seen and examined. Agree with above. He denies missing any anti-coagulation. Will plan to proceed with DCCV sedating him with fentanyl and versed.   Mikle Bosworth.D.

## 2016-07-19 NOTE — Telephone Encounter (Signed)
The pt brought paperwork into the office that was mailed to him from [employer] pt assistance program. I have left the paperwork in Dr Forde Dandy mailbox awaiting his signature.  I will fax all paperwork to [facility] when all is complete.

## 2016-07-25 NOTE — Telephone Encounter (Signed)
**Note De-Identified [place] Obfuscation** Received signed pt assistance application back from Dr Lovena Le for [facility]. Application has been faxed to [employer] pt assistance program. Awaiting response.

## 2016-07-27 DIAGNOSIS — I428 Other cardiomyopathies: Secondary | ICD-10-CM | POA: Diagnosis not present

## 2016-07-27 DIAGNOSIS — I481 Persistent atrial fibrillation: Secondary | ICD-10-CM | POA: Diagnosis not present

## 2016-07-27 DIAGNOSIS — I2510 Atherosclerotic heart disease of native coronary artery without angina pectoris: Secondary | ICD-10-CM | POA: Diagnosis not present

## 2016-07-27 DIAGNOSIS — Z136 Encounter for screening for cardiovascular disorders: Secondary | ICD-10-CM | POA: Diagnosis not present

## 2016-07-27 DIAGNOSIS — N400 Enlarged prostate without lower urinary tract symptoms: Secondary | ICD-10-CM | POA: Diagnosis not present

## 2016-07-27 DIAGNOSIS — Z1389 Encounter for screening for other disorder: Secondary | ICD-10-CM | POA: Diagnosis not present

## 2016-07-27 DIAGNOSIS — Z0000 Encounter for general adult medical examination without abnormal findings: Secondary | ICD-10-CM | POA: Diagnosis not present

## 2016-07-30 DIAGNOSIS — H524 Presbyopia: Secondary | ICD-10-CM | POA: Diagnosis not present

## 2016-07-30 DIAGNOSIS — H5213 Myopia, bilateral: Secondary | ICD-10-CM | POA: Diagnosis not present

## 2016-07-30 DIAGNOSIS — H52223 Regular astigmatism, bilateral: Secondary | ICD-10-CM | POA: Diagnosis not present

## 2016-08-03 ENCOUNTER — Ambulatory Visit (INDEPENDENT_AMBULATORY_CARE_PROVIDER_SITE_OTHER): Payer: Medicare HMO | Admitting: Internal Medicine

## 2016-08-03 ENCOUNTER — Encounter: Payer: Self-pay | Admitting: Internal Medicine

## 2016-08-03 VITALS — BP 112/68 | HR 61 | Ht 66.0 in | Wt 203.4 lb

## 2016-08-03 DIAGNOSIS — I428 Other cardiomyopathies: Secondary | ICD-10-CM

## 2016-08-03 DIAGNOSIS — I4891 Unspecified atrial fibrillation: Secondary | ICD-10-CM

## 2016-08-03 DIAGNOSIS — Z95810 Presence of automatic (implantable) cardiac defibrillator: Secondary | ICD-10-CM | POA: Diagnosis not present

## 2016-08-03 NOTE — Patient Instructions (Signed)
Medication Instructions:  Your physician recommends that you continue on your current medications as directed. Please refer to the Current Medication list given to you today.   Labwork: None Ordered   Testing/Procedures: None Ordered   Follow-Up: Your physician wants you to follow-up in: 9 months with Dr. Lovena Le. You will receive a reminder letter in the mail two months in advance. If you don't receive a letter, please call our office to schedule the follow-up appointment.  Remote monitoring is used to monitor your ICD from home. This monitoring reduces the number of office visits required to check your device to one time per year. It allows [place] to keep an eye on the functioning of your device to ensure it is working properly. You are scheduled for a device check from home on  11/05/16 . You may send your transmission at any time that day. If you have a wireless device, the transmission will be sent automatically. After your physician reviews your transmission, you will receive a postcard with your next transmission date.    Any Other Special Instructions Will Be Listed Below (If Applicable).     If you need a refill on your cardiac medications before your next appointment, please call your pharmacy.

## 2016-08-03 NOTE — Progress Notes (Signed)
HPI [name] returns today for followup. He is a pleasant 66 yo man with PAF who underwent DCCV 3 weeks ago. In the interim, he has done well. He walked 4 miles this morning. No syncope or chest pain. No edema.  Allergies  Allergen Reactions  . Aspirin Anaphylaxis and Hives  . Sulfa Antibiotics Anaphylaxis and Hives     Current Outpatient Prescriptions  Medication Sig Dispense Refill  . atorvastatin (LIPITOR) 40 MG tablet Take 1 tablet (40 mg total) by mouth daily. (Patient taking differently: Take 20 mg by mouth every morning. ) 90 tablet 3  . carvedilol (COREG) 3.125 MG tablet Take 1 tablet (3.125 mg total) by mouth 2 (two) times daily. 180 tablet 3  . cholecalciferol (VITAMIN D) 1000 units tablet Take 1,000 Units by mouth daily.    . furosemide (LASIX) 40 MG tablet Take 40 mg by mouth 2 (two) times daily.   0  . Multiple Vitamin (ONE-A-DAY MENS PO) Take 1 tablet by mouth daily.     . Omega-3 Fatty Acids (OMEGA 3 PO) Take 1 tablet by mouth 2 (two) times daily.     . potassium chloride (K-DUR) 10 MEQ tablet Take 2 tablets (20 mEq total) by mouth daily. 30 tablet 6  . rivaroxaban (XARELTO) 20 MG TABS tablet Take 1 tablet (20 mg total) by mouth daily with supper. 90 tablet 3  . sacubitril-valsartan (ENTRESTO) 24-26 MG Take 1 tablet by mouth 2 (two) times daily. 180 tablet 3  . sotalol (BETAPACE) 80 MG tablet Take 1.5 tablets (120 mg total) by mouth 2 (two) times daily. 180 tablet 3   No current facility-administered medications for this visit.      Past Medical History:  Diagnosis Date  . AICD (automatic cardioverter/defibrillator) present 05/25/2016   biv icd  . Arthritis    knee  . Chronic combined systolic and diastolic CHF (congestive heart failure) ([place])    Echo 1/18: Mild conc LVH, EF 15-20, severe diff HK, inf and inf-septal AK, Gr 3 DD, mild to mod MR, severe LAE, mod reduced RVSF, mod RAE, mild TR, PASP 50  . Coronary artery disease involving native coronary  artery without angina pectoris 04/17/2016   LHC 1/18: pLCx 30, mLCx 20, mRCA 40, dRCA 20, LVEDP 23, mean RA 8, PA 42/20, PCWP 17  . LBBB (left bundle branch block)   . NICM (nonischemic cardiomyopathy) ([place])    Echo 1/18:  Mild conc LVH, EF 15-20, severe diff HK, inf and inf-septal AK, Gr 3 DD, mild to mod MR, severe LAE, mod reduced RVSF, mod RAE, mild TR, PASP 50  . Other secondary pulmonary hypertension ([place]) 04/17/2016    ROS:   All systems reviewed and negative except as noted in the HPI.   Past Surgical History:  Procedure Laterality Date  . BIV ICD INSERTION CRT-D N/A 05/25/2016   Procedure: BiV ICD Insertion CRT-D;  Surgeon: [name], MD;  Location: [facility];  Service: Cardiovascular;  Laterality: N/A;  . CARDIAC CATHETERIZATION N/A 03/02/2016   Procedure: Right/Left Heart Cath and Coronary Angiography;  Surgeon: Nelva Bush, MD;  Location: [facility];  Service: Cardiovascular;  Laterality: N/A;  . CARDIOVERSION N/A 07/17/2016   Procedure: Cardioversion;  Surgeon: [name], MD;  Location: [facility];  Service: Cardiovascular;  Laterality: N/A;     Family History  Problem Relation Age of Onset  . Hypertension Mother   . Heart disease Mother   .  Diabetes Mother   . Diabetes Father   . Hypertension Father   . Cancer Father   . Healthy Sister   . Heart attack Brother   . Heart disease Brother 72       + tobacco  . Healthy Brother      Social History   Social History  . Marital status: Married    Spouse name: N/A  . Number of children: N/A  . Years of education: N/A   Occupational History  . Not on file.   Social History Main Topics  . Smoking status: Never Smoker  . Smokeless tobacco: Never Used  . Alcohol use No  . Drug use: No  . Sexual activity: Not on file   Other Topics Concern  . Not on file   Social History Narrative  . No narrative on file     BP 112/68   Pulse 61   Ht 5\' 6"  (1.676 m)   Wt 203 lb 6.4  oz (92.3 kg)   SpO2 98%   BMI 32.83 kg/m   Physical Exam:  Well appearing 65 yo man, NAD HEENT: Unremarkable Neck:  6 cm JVD, no thyromegally Lymphatics:  No adenopathy Back:  No CVA tenderness Lungs:  Clear with no wheezes HEART:  Regular rate rhythm, no murmurs, no rubs, no clicks Abd:  soft, positive bowel sounds, no organomegally, no rebound, no guarding Ext:  2 plus pulses, no edema, no cyanosis, no clubbing Skin:  No rashes no nodules Neuro:  CN II through XII intact, motor grossly intact  EKG - nsr with AV pacing  DEVICE  Normal device function.  See PaceArt for details.   Assess/Plan: 1. PAF - he has maintained NSR very nicely. He will continue sotalol.  2. HTN heart disease - his blood pressure is well controlled today. He will continue his current meds. 3. BiV ICD - his [employer] BiV ICD is working normally. Will recheck in several months. 4. Chronic systolic heart failure - his symptoms are class 2. He will continue his current meds.  Jon Williams.D.

## 2016-08-07 LAB — CUP PACEART INCLINIC DEVICE CHECK
Battery Remaining Longevity: 73 mo
Brady Statistic RA Percent Paced: 65 %
Brady Statistic RV Percent Paced: 99.51 %
Date Time Interrogation Session: 20180622180542
HighPow Impedance: 59.625
Implantable Lead Implant Date: 20180413
Implantable Lead Implant Date: 20180413
Implantable Lead Implant Date: 20180413
Implantable Lead Location: 753858
Implantable Lead Location: 753859
Implantable Lead Location: 753860
Implantable Lead Model: 7122
Implantable Pulse Generator Implant Date: 20180413
Lead Channel Impedance Value: 512.5 Ohm
Lead Channel Impedance Value: 525 Ohm
Lead Channel Impedance Value: 900 Ohm
Lead Channel Pacing Threshold Amplitude: 0.75 V
Lead Channel Pacing Threshold Amplitude: 1 V
Lead Channel Pacing Threshold Amplitude: 1.25 V
Lead Channel Pacing Threshold Pulse Width: 0.5 ms
Lead Channel Pacing Threshold Pulse Width: 0.5 ms
Lead Channel Pacing Threshold Pulse Width: 0.5 ms
Lead Channel Sensing Intrinsic Amplitude: 1.5 mV
Lead Channel Sensing Intrinsic Amplitude: 11.7 mV
Lead Channel Setting Pacing Amplitude: 2 V
Lead Channel Setting Pacing Amplitude: 2.5 V
Lead Channel Setting Pacing Amplitude: 2.5 V
Lead Channel Setting Pacing Pulse Width: 0.5 ms
Lead Channel Setting Pacing Pulse Width: 0.5 ms
Lead Channel Setting Sensing Sensitivity: 0.5 mV
Pulse Gen Serial Number: 7398151

## 2016-08-13 ENCOUNTER — Telehealth: Payer: Self-pay | Admitting: Internal Medicine

## 2016-08-13 NOTE — Telephone Encounter (Signed)
Called, spoke with pt and pt's wife, Jon Williams (on [place]). Pt stated he is checking status on FMLA paperwork. Informed in progress. Wife got on the phone. She stated need filled out for cath on date 07/17/16. Informed pt had cardioversion on 6/5, not cath. Cath was on 03/02/16. She stated she got confused. She agreed cath was 03/02/16. Wife stated to please get documents completed by tomorrow, because that is the deadline. Informed Dr. Lovena Le can only fill out for dates 05/25/16 and 07/17/16. Dr. Saunders Revel will have to fill out for the cath. Informed will forward to Dr. Lovena Le to advise. Wife verbalized understanding .

## 2016-08-13 NOTE — Telephone Encounter (Signed)
F/u Message  Pt call requesting to speak with RN to f/u on wife's [employer] paperwork. Pt states tomorrow is the deadline.please call back to discuss

## 2016-08-13 NOTE — Telephone Encounter (Signed)
Dr. Lovena Le completed documents. Sent back to medical records to process.

## 2016-08-14 ENCOUNTER — Telehealth: Payer: Self-pay | Admitting: Internal Medicine

## 2016-08-14 NOTE — Telephone Encounter (Signed)
Spoke with patient he is aware FMLA paperwork is ready for pick up.

## 2016-08-20 ENCOUNTER — Ambulatory Visit: Payer: Medicare HMO | Admitting: Physician Assistant

## 2016-08-27 ENCOUNTER — Encounter: Payer: Medicare HMO | Admitting: Internal Medicine

## 2016-08-28 ENCOUNTER — Ambulatory Visit: Payer: Medicare HMO | Admitting: Physician Assistant

## 2016-08-28 ENCOUNTER — Other Ambulatory Visit: Payer: Self-pay | Admitting: Internal Medicine

## 2016-08-28 MED ORDER — POTASSIUM CHLORIDE ER 10 MEQ PO TBCR: 20.0000 meq | EXTENDED_RELEASE_TABLET | Freq: Every day | ORAL | 3 refills | Status: DC

## 2016-08-29 ENCOUNTER — Ambulatory Visit: Payer: Medicare HMO | Admitting: Podiatry

## 2016-09-04 ENCOUNTER — Ambulatory Visit: Payer: Medicare HMO | Admitting: Podiatry

## 2016-09-26 ENCOUNTER — Encounter: Payer: Self-pay | Admitting: Podiatry

## 2016-09-26 ENCOUNTER — Ambulatory Visit (INDEPENDENT_AMBULATORY_CARE_PROVIDER_SITE_OTHER): Payer: Medicare HMO | Admitting: Podiatry

## 2016-09-26 DIAGNOSIS — B351 Tinea unguium: Secondary | ICD-10-CM | POA: Diagnosis not present

## 2016-09-26 DIAGNOSIS — L97511 Non-pressure chronic ulcer of other part of right foot limited to breakdown of skin: Secondary | ICD-10-CM

## 2016-09-26 NOTE — Progress Notes (Signed)
Patient ID: Jon Williams, male   DOB: Dec 16, 1950, 66 y.o.   MRN: [id number] Complaint:  Visit Type: Patient returns to my office for continued preventative foot care services. Complaint: Patient states" my nails have grown long and thick and become painful to walk and wear shoes" . The patient presents for preventative foot care services. No changes to ROS.  He states he develops severe callus between his first and second toes but he has been working in it and it has improved..  Podiatric Exam: Vascular: dorsalis pedis and posterior tibial pulses are palpable bilateral. Capillary return is immediate. Temperature gradient is WNL. Skin turgor WNL  Sensorium: Normal Semmes Weinstein monofilament test. Normal tactile sensation bilaterally. Nail Exam: Pt has thick disfigured discolored nails with subungual debris noted bilateral entire nail hallux through fifth toenails Ulcer Exam: There is no evidence of ulcer or pre-ulcerative changes or infection. Orthopedic Exam: Muscle tone and strength are WNL. No limitations in general ROM. No crepitus or effusions noted. Foot type and digits show no abnormalities. Bony prominences are unremarkable. 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 Skin: No Porokeratosis. No infection or ulcer.   callus noted 1/2  B/L.  Diagnosis:  Onychomycosis, , Pain in right toe, pain in left toes  Callus 1/2  B/L   Jon Williams DPM Treatment & Plan Procedures and Treatment: Consent by patient was obtained for treatment procedures. The patient understood the discussion of treatment and procedures well. All questions were answered thoroughly reviewed. Debridement of mycotic and hypertrophic toenails, 1 through 5 bilateral and clearing of subungual debris. No ulceration, no infection noted.  Debride corn/ulcer right first interspace. Return Visit-Office Procedure: Patient instructed to return to the office for a follow up visit 9 weeks. for continued evaluation and  treatment.   Jon Williams DPM

## 2016-10-24 ENCOUNTER — Other Ambulatory Visit: Payer: Self-pay

## 2016-10-24 MED ORDER — FUROSEMIDE 40 MG PO TABS: 40.0000 mg | ORAL_TABLET | Freq: Two times a day (BID) | ORAL | 8 refills | Status: DC

## 2016-11-05 ENCOUNTER — Telehealth: Payer: Self-pay | Admitting: Cardiology

## 2016-11-05 ENCOUNTER — Ambulatory Visit (INDEPENDENT_AMBULATORY_CARE_PROVIDER_SITE_OTHER): Payer: Medicare HMO | Admitting: *Deleted

## 2016-11-05 DIAGNOSIS — I428 Other cardiomyopathies: Secondary | ICD-10-CM | POA: Diagnosis not present

## 2016-11-05 NOTE — Telephone Encounter (Signed)
Spoke with pt and reminded pt of remote transmission that is due today. Pt verbalized understanding.   

## 2016-11-06 NOTE — Progress Notes (Signed)
Remote ICD transmission.   

## 2016-11-08 ENCOUNTER — Encounter: Payer: Self-pay | Admitting: Cardiology

## 2016-11-09 LAB — CUP PACEART REMOTE DEVICE CHECK
Battery Remaining Longevity: 75 mo
Battery Remaining Percentage: 89 %
Battery Voltage: 3.05 V
Brady Statistic AP VP Percent: 89 %
Brady Statistic AP VS Percent: 1 %
Brady Statistic AS VP Percent: 10 %
Brady Statistic AS VS Percent: 1 %
Brady Statistic RA Percent Paced: 35 %
Date Time Interrogation Session: 20180924172816
HighPow Impedance: 63 Ohm
HighPow Impedance: 63 Ohm
Implantable Lead Implant Date: 20180413
Implantable Lead Implant Date: 20180413
Implantable Lead Implant Date: 20180413
Implantable Lead Location: 753858
Implantable Lead Location: 753859
Implantable Lead Location: 753860
Implantable Lead Model: 7122
Implantable Pulse Generator Implant Date: 20180413
Lead Channel Impedance Value: 450 Ohm
Lead Channel Impedance Value: 510 Ohm
Lead Channel Impedance Value: 860 Ohm
Lead Channel Pacing Threshold Amplitude: 0.75 V
Lead Channel Pacing Threshold Amplitude: 1 V
Lead Channel Pacing Threshold Amplitude: 1.25 V
Lead Channel Pacing Threshold Pulse Width: 0.5 ms
Lead Channel Pacing Threshold Pulse Width: 0.5 ms
Lead Channel Pacing Threshold Pulse Width: 0.5 ms
Lead Channel Sensing Intrinsic Amplitude: 2.5 mV
Lead Channel Sensing Intrinsic Amplitude: 9.9 mV
Lead Channel Setting Pacing Amplitude: 2 V
Lead Channel Setting Pacing Amplitude: 2.5 V
Lead Channel Setting Pacing Amplitude: 2.5 V
Lead Channel Setting Pacing Pulse Width: 0.5 ms
Lead Channel Setting Pacing Pulse Width: 0.5 ms
Lead Channel Setting Sensing Sensitivity: 0.5 mV
Pulse Gen Serial Number: 7398151

## 2016-11-30 ENCOUNTER — Encounter: Payer: Self-pay | Admitting: Podiatry

## 2016-11-30 ENCOUNTER — Ambulatory Visit (INDEPENDENT_AMBULATORY_CARE_PROVIDER_SITE_OTHER): Payer: Medicare HMO | Admitting: Podiatry

## 2016-11-30 DIAGNOSIS — B351 Tinea unguium: Secondary | ICD-10-CM | POA: Diagnosis not present

## 2016-11-30 DIAGNOSIS — L84 Corns and callosities: Secondary | ICD-10-CM

## 2016-11-30 NOTE — Progress Notes (Signed)
Patient ID: Jon Williams, male   DOB: July 18, 1950, 66 y.o.   MRN: [id number] Complaint:  Visit Type: Patient returns to my office for continued preventative foot care services. Complaint: Patient states" my nails have grown long and thick and become painful to walk and wear shoes" . The patient presents for preventative foot care services. No changes to ROS.  He states he develops severe callus between his first and second toes but he has been working in it and it has improved..  Podiatric Exam: Vascular: dorsalis pedis and posterior tibial pulses are palpable bilateral. Capillary return is immediate. Temperature gradient is WNL. Skin turgor WNL  Sensorium: Normal Semmes Weinstein monofilament test. Normal tactile sensation bilaterally. Nail Exam: Pt has thick disfigured discolored nails with subungual debris noted bilateral entire nail hallux through fifth toenails Ulcer Exam: There is no evidence of ulcer or pre-ulcerative changes or infection. Orthopedic Exam: Muscle tone and strength are WNL. No limitations in general ROM. No crepitus or effusions noted. Foot type and digits show no abnormalities. Bony prominences are unremarkable. 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 Skin: No Porokeratosis. No infection or ulcer.   callus noted 1/2  B/L.  Diagnosis:  Onychomycosis, , Pain in right toe, pain in left toes  Callus 1/2  B/L   Gardiner Barefoot DPM Treatment & Plan Procedures and Treatment: Consent by patient was obtained for treatment procedures. The patient understood the discussion of treatment and procedures well. All questions were answered thoroughly reviewed. Debridement of mycotic and hypertrophic toenails, 1 through 5 bilateral and clearing of subungual debris. No ulceration, no infection noted.  Debride corn/ulcer right first interspace. Return Visit-Office Procedure: Patient instructed to return to the office for a follow up visit 9 weeks. for continued evaluation and  treatment.   Gardiner Barefoot DPM

## 2016-12-10 ENCOUNTER — Telehealth: Payer: Self-pay | Admitting: *Deleted

## 2016-12-10 NOTE — Telephone Encounter (Signed)
Called patient about persistent AF since 09-2016. Patient denies any sx's of increased ShOB or fatigue. Will inform Dr. Lovena Le. Patient to f/u as scheduled.

## 2016-12-24 DIAGNOSIS — R972 Elevated prostate specific antigen [PSA]: Secondary | ICD-10-CM | POA: Diagnosis not present

## 2017-01-02 DIAGNOSIS — N5201 Erectile dysfunction due to arterial insufficiency: Secondary | ICD-10-CM | POA: Diagnosis not present

## 2017-01-02 DIAGNOSIS — R972 Elevated prostate specific antigen [PSA]: Secondary | ICD-10-CM | POA: Diagnosis not present

## 2017-01-08 ENCOUNTER — Telehealth: Payer: Self-pay

## 2017-01-08 NOTE — Telephone Encounter (Signed)
   [facility] Pre-operative Risk Assessment    Request for surgical clearance:  1. What type of surgery is being performed? Prostate ultrasound and biopsy   2. When is this surgery scheduled? TBD   3. Are there any medications that need to be held prior to surgery and how long? Xarelto for 5 days   4. Practice name and name of physician performing surgery? [facility] (Dr. Junious Silk)   5. What is your office phone and fax number?   1. Phone: [phone] 2. Fax: [phone]   6. Anesthesia type (None, local, MAC, general) ? Not specified

## 2017-01-09 NOTE — Telephone Encounter (Signed)
Dr. Tanna Furry patient, although Dr. [name] is also listed as primary cardiologist, however does not appears Dr. [name] has seen the patient. Established with cardiology since Jan 0350 for new systolic HF, cath minimal disease, nonischemic cardiomyopathy. Since then, repeat echo in Mar 2018 continue to show significant LV dysfunction. Has since underwent BiV ICD. I do not see a new repeat echo since BiV ICD.   Need to clarify if the procedure will be done under general anesthesia, if so will need to clarify with Dr. Lovena Le to see if a visit and echo is needed.   Based on RCRI periop risk score: class II risk, 0.9% risk of major cardiac event.   Hilbert Corrigan PA Pager: [phone]

## 2017-01-11 NOTE — Telephone Encounter (Signed)
    Chart reviewed as part of pre-operative protocol coverage. Almyra Deforest recommended clarification on type of anesthesia but do not see this was routed to callback pool to obtain more info. Will route.  Charlie Pitter, PA-C  01/11/2017, 8:10 AM

## 2017-01-11 NOTE — Telephone Encounter (Signed)
Reached out to [facility] re: pts prostate u/s and biopsy, to see what type of anesthesia will be used.   Spoke with Jenny Reichmann and she verifies that Dr. Junious Silk is out of the office until 9:30 and she will ask him when he comes in and call us back.

## 2017-01-14 NOTE — Telephone Encounter (Signed)
Please advice regarding clearance and need of repeat echo per Hao's recommendations.

## 2017-01-14 NOTE — Telephone Encounter (Signed)
Follow up     Patient is getting a local block anesthesia.  How many days can he come off the xarelto ?

## 2017-01-17 NOTE — Telephone Encounter (Signed)
He may proceed with surgery. Low risk. GT

## 2017-01-18 NOTE — Telephone Encounter (Signed)
   Chart reviewed as part of pre-operative protocol coverage. Will forward to pharm for input for anticoag. Then will need to route bundled rec to requesting provider.   Charlie Pitter, PA-C 01/18/2017, 8:16 AM

## 2017-01-21 NOTE — Telephone Encounter (Signed)
Patient with diagnosis of Atrial Fibrillation on Xarelto for anticoagulation.    Procedure: Prostate biopsy and utrasound Date of procedure: TBD  CHADS2-VASc score of  4 (CHF, HTN, AGE (65-74), CAD)  *Noted cardioversion performed on 07/17/2016 (> 6 mo ago). No history of VTE/stroke/TIA noted during chart review*   CrCl = 63 ml/min with Adj BW due to BMI > 40  (est CrCl with IBW remains > 50 ml/min as well)  Platelet count = 179 on 07/13/2016  Per office protocol, patient can hold Xarelto for 2 days prior to procedure.  Patient will not need bridging with Lovenox (enoxaparin) around procedure.  Patient should restart Xarelto the day after procedure , at discretion of procedure MD.  Harrington Challenger PharmD, BCPS, [place] [address] [place] [address] 01/21/2017 11:32 AM

## 2017-01-23 NOTE — Telephone Encounter (Signed)
    Primary Cardiologist: Dr. Lovena Le  The patient has been cleared to proceed by Dr. Lovena Le as he is low-risk for the procedure. Pharmacy has been made aware and per office protocol, he can hold Xarelto 2 days prior to the procedure without the need for Lovenox bridging. Should restart Xarelto the day after procedure, at the discretion of the procedure.   I will route this recommendation to the requesting party via Epic fax function and remove from pre-op pool.  Please call with questions.  Erma Heritage, PA-C 01/23/2017, 1:44 PM

## 2017-01-25 ENCOUNTER — Other Ambulatory Visit: Payer: Self-pay | Admitting: Internal Medicine

## 2017-01-28 ENCOUNTER — Other Ambulatory Visit: Payer: Self-pay | Admitting: Internal Medicine

## 2017-01-31 DIAGNOSIS — J069 Acute upper respiratory infection, unspecified: Secondary | ICD-10-CM | POA: Diagnosis not present

## 2017-02-01 ENCOUNTER — Ambulatory Visit: Payer: Medicare HMO | Admitting: Podiatry

## 2017-02-06 ENCOUNTER — Telehealth: Payer: Self-pay | Admitting: Internal Medicine

## 2017-02-06 ENCOUNTER — Ambulatory Visit (INDEPENDENT_AMBULATORY_CARE_PROVIDER_SITE_OTHER): Payer: Medicare HMO | Admitting: *Deleted

## 2017-02-06 DIAGNOSIS — I428 Other cardiomyopathies: Secondary | ICD-10-CM

## 2017-02-06 NOTE — Telephone Encounter (Signed)
Patient spoke with Pamala Hurry for trouble shooting of home monitor. Unable to successfully troubleshoot - patient referred to tech services

## 2017-02-06 NOTE — Telephone Encounter (Signed)
New message  ° ° ° ° °1. Has your device fired? no ° °2. Is you device beeping? yes °3. Are you experiencing draining or swelling at device site? no °4. Are you calling to see if we received your device transmission? no ° °5. Have you passed out? no ° ° ° °Please route to Device Clinic Pool °

## 2017-02-06 NOTE — Progress Notes (Signed)
Remote ICD transmission.   

## 2017-02-07 ENCOUNTER — Encounter: Payer: Self-pay | Admitting: Cardiology

## 2017-02-11 ENCOUNTER — Telehealth: Payer: Self-pay | Admitting: Internal Medicine

## 2017-02-11 ENCOUNTER — Other Ambulatory Visit: Payer: Self-pay | Admitting: Internal Medicine

## 2017-02-11 DIAGNOSIS — I4891 Unspecified atrial fibrillation: Secondary | ICD-10-CM

## 2017-02-11 MED ORDER — RIVAROXABAN 20 MG PO TABS
20.0000 mg | ORAL_TABLET | Freq: Every day | ORAL | 1 refills | Status: DC
Start: 2017-02-11 — End: 2017-04-12

## 2017-02-11 MED ORDER — SACUBITRIL-VALSARTAN 24-26 MG PO TABS: 1.0000 | ORAL_TABLET | Freq: Two times a day (BID) | ORAL | 1 refills | Status: DC

## 2017-02-11 MED ORDER — SOTALOL HCL 80 MG PO TABS: 120.0000 mg | ORAL_TABLET | Freq: Two times a day (BID) | ORAL | 1 refills | Status: DC

## 2017-02-11 NOTE — Telephone Encounter (Signed)
Xarelto 20mg  refill request erecieved; pt is 66 yrs old, Wt-92.3kg, Crea-1.18 on 07/13/16, last seen by Dr. Lovena Le on 08/03/16, CrCl-78.27ml/min; refill sent.

## 2017-02-11 NOTE — Telephone Encounter (Signed)
Pt requesting a refill on Xarelto. Please address °

## 2017-02-11 NOTE — Addendum Note (Signed)
Addended by: Derrel Nip B on: 02/11/2017 02:36 PM   Modules accepted: Orders

## 2017-02-11 NOTE — Telephone Encounter (Signed)
New message       *STAT* If patient is at the pharmacy, call can be transferred to refill team.   1. Which medications need to be refilled? (please list name of each medication and dose if known)  sacubitril-valsartan (ENTRESTO) 24-26 MG Take 1 tablet by mouth 2 (two) times daily.   rivaroxaban (XARELTO) 20 MG TABS tablet Take 1 tablet (20 mg total) by mouth daily with supper.     2. Which pharmacy/location (including street and city if local pharmacy) is medication to be sent to? [facility] on [address]   3. Do they need a 30 day or 90 day supply? [place]

## 2017-02-12 DIAGNOSIS — C61 Malignant neoplasm of prostate: Secondary | ICD-10-CM

## 2017-02-12 HISTORY — DX: Malignant neoplasm of prostate: C61

## 2017-02-13 ENCOUNTER — Telehealth: Payer: Self-pay | Admitting: Internal Medicine

## 2017-02-13 DIAGNOSIS — R972 Elevated prostate specific antigen [PSA]: Secondary | ICD-10-CM | POA: Diagnosis not present

## 2017-02-13 NOTE — Telephone Encounter (Signed)
New Message    *STAT* If patient is at the pharmacy, call can be transferred to refill team.   1. Which medications need to be refilled? (please list name of each medication and dose if known) sotalol (BETAPACE) 80 MG tablet  2. Which pharmacy/location (including street and city if local pharmacy) is medication to be sent to? [facility] [place]    3. Do they need a 30 day or 90 day supply? 30   Patient was told that he needed to make an appt. He did make the appt for 04/12/2017 but he is out of the prescription and would like to know can he get this filled up until he has his appt.

## 2017-02-14 MED ORDER — SOTALOL HCL 80 MG PO TABS: 120.0000 mg | ORAL_TABLET | Freq: Two times a day (BID) | ORAL | 1 refills | Status: DC

## 2017-02-14 NOTE — Telephone Encounter (Signed)
Pt medication was resent to pt's pharmacy as requested. Confirmation received.

## 2017-02-14 NOTE — Addendum Note (Signed)
Addended by: Derl Barrow on: 02/14/2017 09:35 AM   Modules accepted: Orders

## 2017-02-15 ENCOUNTER — Telehealth: Payer: Self-pay | Admitting: Internal Medicine

## 2017-02-15 NOTE — Telephone Encounter (Signed)
Walk in Pt Form-Medications To High,Can Afford, please call. Placed in Dr.[name] Doc Box.

## 2017-02-18 ENCOUNTER — Telehealth: Payer: Self-pay

## 2017-02-18 LAB — CUP PACEART REMOTE DEVICE CHECK
Battery Remaining Longevity: 71 mo
Battery Remaining Percentage: 85 %
Battery Voltage: 3.01 V
Brady Statistic AP VP Percent: 89 %
Brady Statistic AP VS Percent: 1 %
Brady Statistic AS VP Percent: 10 %
Brady Statistic AS VS Percent: 1 %
Brady Statistic RA Percent Paced: 19 %
Date Time Interrogation Session: 20181226155921
HighPow Impedance: 69 Ohm
HighPow Impedance: 69 Ohm
Implantable Lead Implant Date: 20180413
Implantable Lead Implant Date: 20180413
Implantable Lead Implant Date: 20180413
Implantable Lead Location: 753858
Implantable Lead Location: 753859
Implantable Lead Location: 753860
Implantable Lead Model: 7122
Implantable Pulse Generator Implant Date: 20180413
Lead Channel Impedance Value: 450 Ohm
Lead Channel Impedance Value: 510 Ohm
Lead Channel Impedance Value: 900 Ohm
Lead Channel Pacing Threshold Amplitude: 0.75 V
Lead Channel Pacing Threshold Amplitude: 1 V
Lead Channel Pacing Threshold Amplitude: 1.25 V
Lead Channel Pacing Threshold Pulse Width: 0.5 ms
Lead Channel Pacing Threshold Pulse Width: 0.5 ms
Lead Channel Pacing Threshold Pulse Width: 0.5 ms
Lead Channel Sensing Intrinsic Amplitude: 0.5 mV
Lead Channel Sensing Intrinsic Amplitude: 8 mV
Lead Channel Setting Pacing Amplitude: 2 V
Lead Channel Setting Pacing Amplitude: 2.5 V
Lead Channel Setting Pacing Amplitude: 2.5 V
Lead Channel Setting Pacing Pulse Width: 0.5 ms
Lead Channel Setting Pacing Pulse Width: 0.5 ms
Lead Channel Setting Sensing Sensitivity: 0.5 mV
Pulse Gen Serial Number: 7398151

## 2017-02-18 NOTE — Telephone Encounter (Signed)
Returned call to Pt.  Pt with issues affording Xarelto and Entresto.  Pt states [name] and [name] has not received his application (?).  Per Jeani Hawking they have, Jeani Hawking has called multiple times. Per Jeani Hawking: If issue is with 2018, Pt must call [name] and [name] to settle. If issue is with 2019 medications-Pt does not qualify for assistance yet.  He must spend 3% out of pocket and needs proof of 2018 income before can apply for 2019 assistance. Pt has surgery on Wednesday, nothing further to be done for Pt at this time.  Will await Pt meeting 2019 requirements for further assistance.

## 2017-02-20 DIAGNOSIS — C61 Malignant neoplasm of prostate: Secondary | ICD-10-CM | POA: Diagnosis not present

## 2017-02-20 DIAGNOSIS — R972 Elevated prostate specific antigen [PSA]: Secondary | ICD-10-CM | POA: Diagnosis not present

## 2017-02-20 HISTORY — PX: PROSTATE BIOPSY: SHX241

## 2017-02-26 ENCOUNTER — Other Ambulatory Visit: Payer: Self-pay | Admitting: Urology

## 2017-02-26 DIAGNOSIS — C61 Malignant neoplasm of prostate: Secondary | ICD-10-CM

## 2017-03-07 DIAGNOSIS — C61 Malignant neoplasm of prostate: Secondary | ICD-10-CM | POA: Diagnosis not present

## 2017-03-11 ENCOUNTER — Encounter (HOSPITAL_COMMUNITY)
Admission: RE | Admit: 2017-03-11 | Discharge: 2017-03-11 | Disposition: A | Discharge status: Home / Self Care | Payer: Medicare HMO | Source: Ambulatory Visit | Attending: Urology | Admitting: Urology

## 2017-03-11 DIAGNOSIS — C61 Malignant neoplasm of prostate: Secondary | ICD-10-CM | POA: Diagnosis not present

## 2017-03-11 MED ORDER — TECHNETIUM TC 99M MEDRONATE IV KIT
19.8000 | PACK | Freq: Once | INTRAVENOUS | Status: AC | PRN
  Administered 2017-03-11: 19.8 via INTRAVENOUS

## 2017-03-13 DIAGNOSIS — C61 Malignant neoplasm of prostate: Secondary | ICD-10-CM | POA: Diagnosis not present

## 2017-03-25 ENCOUNTER — Telehealth: Payer: Self-pay | Admitting: Medical Oncology

## 2017-03-25 NOTE — Telephone Encounter (Signed)
I called pt to introduce myself as the Prostate Nurse Navigator and the Coordinator of the Prostate [place].  1. I confirmed with the patient he is aware of his referral to the clinic 04/02/17 arriving at 12:30pm.  2. I discussed the format of the clinic and the physicians he will be seeing that day.  3. I discussed where the clinic is located and how to contact me.  4. I confirmed his address and informed him I would be mailing a packet of information and forms to be completed. I asked him to bring them with him the day of his appointment.   He voiced understanding of the above. I asked him to call me if he has any questions or concerns regarding his appointments or the forms he needs to complete.

## 2017-03-29 ENCOUNTER — Encounter: Payer: Self-pay | Admitting: Radiation Oncology

## 2017-03-29 NOTE — Progress Notes (Signed)
GU Location of Tumor / Histology: prostatic adenocarcinoma  If Prostate Cancer, Gleason Score is (4 + 4) and PSA is (6.73). Prostate volume: 60 grams.  Jon Williams was diagnosed with high risk prostate cancer  And new nodule on the right base of his prostate January 2019.  Biopsies of prostate (if applicable) revealed:    Past/Anticipated interventions by urology, if any: prostate biopsy, bone scan (normal apart from mild uptake right orbit and right knee, referral to [facility]  Past/Anticipated interventions by medical oncology, if any: no  Weight changes, if any: no  Bowel/Bladder complaints, if any: frequency, nocturia, and ED.   Nausea/Vomiting, if any: no  Pain issues, if any:  Joint pain  SAFETY ISSUES:  Prior radiation? no  Pacemaker/ICD? yes  Possible current pregnancy? no  Is the patient on methotrexate? no  Current Complaints / other details:  67 year old male. Married.

## 2017-04-01 ENCOUNTER — Telehealth: Payer: Self-pay | Admitting: Medical Oncology

## 2017-04-01 NOTE — Telephone Encounter (Signed)
Left a message reminding patient of appointment for [facility]state [place] arriving between 12:00-12:15 pm. I reviewed location, valet parking and the registration process. I reminded him to have lunch and to bring the complete medical forms that I mailed to him. I asked him to return my call to confirm.

## 2017-04-02 ENCOUNTER — Telehealth: Payer: Self-pay | Admitting: Radiation Oncology

## 2017-04-02 ENCOUNTER — Ambulatory Visit
Admission: RE | Admit: 2017-04-02 | Discharge: 2017-04-02 | Disposition: A | Discharge status: Home / Self Care | Payer: Medicare HMO | Source: Ambulatory Visit | Attending: Radiation Oncology | Admitting: Radiation Oncology

## 2017-04-02 ENCOUNTER — Telehealth: Payer: Self-pay | Admitting: Medical Oncology

## 2017-04-02 ENCOUNTER — Other Ambulatory Visit: Payer: Self-pay

## 2017-04-02 ENCOUNTER — Inpatient Hospital Stay: Payer: Medicare HMO | Attending: Oncology | Admitting: Oncology

## 2017-04-02 ENCOUNTER — Encounter: Payer: Self-pay | Admitting: Medical Oncology

## 2017-04-02 ENCOUNTER — Encounter: Payer: Self-pay | Admitting: Radiation Oncology

## 2017-04-02 DIAGNOSIS — I429 Cardiomyopathy, unspecified: Secondary | ICD-10-CM | POA: Diagnosis not present

## 2017-04-02 DIAGNOSIS — R590 Localized enlarged lymph nodes: Secondary | ICD-10-CM | POA: Insufficient documentation

## 2017-04-02 DIAGNOSIS — C61 Malignant neoplasm of prostate: Secondary | ICD-10-CM | POA: Diagnosis not present

## 2017-04-02 DIAGNOSIS — Z9889 Other specified postprocedural states: Secondary | ICD-10-CM | POA: Insufficient documentation

## 2017-04-02 DIAGNOSIS — Z79899 Other long term (current) drug therapy: Secondary | ICD-10-CM | POA: Insufficient documentation

## 2017-04-02 DIAGNOSIS — Z950 Presence of cardiac pacemaker: Secondary | ICD-10-CM | POA: Diagnosis not present

## 2017-04-02 DIAGNOSIS — Z7901 Long term (current) use of anticoagulants: Secondary | ICD-10-CM | POA: Diagnosis not present

## 2017-04-02 DIAGNOSIS — Z809 Family history of malignant neoplasm, unspecified: Secondary | ICD-10-CM | POA: Diagnosis not present

## 2017-04-02 DIAGNOSIS — Z833 Family history of diabetes mellitus: Secondary | ICD-10-CM | POA: Insufficient documentation

## 2017-04-02 DIAGNOSIS — I2510 Atherosclerotic heart disease of native coronary artery without angina pectoris: Secondary | ICD-10-CM | POA: Diagnosis not present

## 2017-04-02 DIAGNOSIS — Z95810 Presence of automatic (implantable) cardiac defibrillator: Secondary | ICD-10-CM | POA: Insufficient documentation

## 2017-04-02 DIAGNOSIS — Z8249 Family history of ischemic heart disease and other diseases of the circulatory system: Secondary | ICD-10-CM | POA: Insufficient documentation

## 2017-04-02 DIAGNOSIS — I110 Hypertensive heart disease with heart failure: Secondary | ICD-10-CM | POA: Diagnosis not present

## 2017-04-02 DIAGNOSIS — Z882 Allergy status to sulfonamides status: Secondary | ICD-10-CM | POA: Insufficient documentation

## 2017-04-02 DIAGNOSIS — I5042 Chronic combined systolic (congestive) and diastolic (congestive) heart failure: Secondary | ICD-10-CM | POA: Diagnosis not present

## 2017-04-02 DIAGNOSIS — R972 Elevated prostate specific antigen [PSA]: Secondary | ICD-10-CM | POA: Diagnosis not present

## 2017-04-02 DIAGNOSIS — Z886 Allergy status to analgesic agent status: Secondary | ICD-10-CM | POA: Insufficient documentation

## 2017-04-02 HISTORY — DX: Malignant neoplasm of prostate: C61

## 2017-04-02 NOTE — Telephone Encounter (Signed)
Faxed PACEMAKER/ICD form to Dr. Cristopher Peru for completion at [phone] and [phone]. Fax confirmation of delivery to both obtained.

## 2017-04-02 NOTE — Progress Notes (Signed)
Radiation Oncology         [phone] ________________________________  Multidisciplinary Prostate Cancer Clinic  Initial Radiation Oncology Consultation  Name: Jon Williams MRN: [id number]  Date: 04/02/2017  DOB: Jul 15, 1950  LK:[id number], Jori Moll, MD  Raynelle Bring, MD   REFERRING PHYSICIAN: Raynelle Bring, MD  DIAGNOSIS: 67 y.o. gentleman with stage T2a adenocarcinoma of the prostate with a Gleason's score of 4+4 and a PSA of 6.73    ICD-10-CM   1. Malignant neoplasm of prostate ([place]) [place] ILLNESS::Jon Williams is a 67 y.o. gentleman.  He was noted to have an elevated PSA of 5.57 on 12/24/16 by his primary care physician, Dr. Glendale Chard.  Accordingly, he was referred for evaluation in urology by Dr. Junious Silk,  digital rectal examination was performed at that time revealing a 2+ prostate with a firm riht lobe.  The patient proceeded to transrectal ultrasound with 12 biopsies of the prostate on 02/20/17.  The prostate volume measured 60.38 cc.  Out of 12 core biopsies, 7 were positive.  The maximum Gleason score was 4+4, and this was seen in the right lateral base.   He had a bone scan and CT pelvis on March 11, 2017 for disease staging.  The bone scan did not show any evidence of obvious metastatic disease.  However, on the CT pelvis, there was a 10 mm left external iliac lymph node as well as a 12 mm right node positioned between the external and internal iliac, both suspicious for metastatic disease.  The patient reviewed the biopsy results with his urologist and he has kindly been referred today to the multidisciplinary prostate cancer clinic for presentation of pathology and radiology studies in our conference for discussion of potential radiation treatment options and clinical evaluation.  PREVIOUS RADIATION THERAPY: No  PAST MEDICAL HISTORY:  has a past medical history of AICD (automatic cardioverter/defibrillator) present (05/25/2016), Arthritis,  Chronic combined systolic and diastolic CHF (congestive heart failure) ([place]), Coronary artery disease involving native coronary artery without angina pectoris (04/17/2016), LBBB (left bundle branch block), NICM (nonischemic cardiomyopathy) ([place]), Other secondary pulmonary hypertension ([place]) (04/17/2016), and Prostate cancer ([place]).    PAST SURGICAL HISTORY: Past Surgical History:  Procedure Laterality Date  . BIV ICD INSERTION CRT-D N/A 05/25/2016   Procedure: BiV ICD Insertion CRT-D;  Surgeon: [name], MD;  Location: [facility];  Service: Cardiovascular;  Laterality: N/A;  . CARDIAC CATHETERIZATION N/A 03/02/2016   Procedure: Right/Left Heart Cath and Coronary Angiography;  Surgeon: Nelva Bush, MD;  Location: [facility];  Service: Cardiovascular;  Laterality: N/A;  . CARDIOVERSION N/A 07/17/2016   Procedure: Cardioversion;  Surgeon: [name], MD;  Location: [facility];  Service: Cardiovascular;  Laterality: N/A;  . PROSTATE BIOPSY      FAMILY HISTORY: family history includes Cancer in his father; Diabetes in his father and mother; Healthy in his brother and sister; Heart attack in his brother; Heart disease in his mother; Heart disease (age of onset: 70) in his brother; Hypertension in his father and mother.  SOCIAL HISTORY:  reports that  has never smoked. he has never used smokeless tobacco. He reports that he does not drink alcohol or use drugs.  ALLERGIES: Aspirin and Sulfa antibiotics  MEDICATIONS:  Current Outpatient Medications  Medication Sig Dispense Refill  . atorvastatin (LIPITOR) 40 MG tablet Take 1 tablet (40 mg total) by mouth daily. (Patient taking differently: Take 20 mg by mouth  every morning. ) 90 tablet 3  . carvedilol (COREG) 3.125 MG tablet   0  . cholecalciferol (VITAMIN D) 1000 units tablet Take 1,000 Units by mouth daily.    . furosemide (LASIX) 40 MG tablet Take 1 tablet (40 mg total) by mouth 2 (two) times daily. 60 tablet 8  .  Multiple Vitamin (ONE-A-DAY MENS PO) Take 1 tablet by mouth daily.     . Omega-3 Fatty Acids (OMEGA 3 PO) Take 1 tablet by mouth 2 (two) times daily.     . potassium chloride (K-DUR) 10 MEQ tablet Take 20 mEq by mouth daily.  0  . rivaroxaban (XARELTO) 20 MG TABS tablet Take 1 tablet (20 mg total) by mouth daily with supper. 90 tablet 1  . sacubitril-valsartan (ENTRESTO) 24-26 MG Take 1 tablet by mouth 2 (two) times daily. 180 tablet 1  . sotalol (BETAPACE) 80 MG tablet Take 1.5 tablets (120 mg total) by mouth 2 (two) times daily. Please keep upcoming appt in March for future refills. Thank you 270 tablet 1   No current facility-administered medications for this encounter.     REVIEW OF SYSTEMS:  On review of systems, the patient reports that he is doing well overall.  He denies any chest pain, shortness of breath, cough, fevers, chills, night sweats, unintended weight changes.  He denies any bowel or bladder disturbances, and denies abdominal pain, nausea or vomiting.  He denies any new musculoskeletal or joint aches or pains, new skin lesions or concerns.  The patient completed an IPSS and IIEF questionnaire.  His IPSS score was 5 indicating mild urinary outflow obstructive symptoms.  He indicated that he is rarely able to complete sexual activity due to ED.  A complete review of systems is obtained and is otherwise negative.  PHYSICAL EXAM: In general this is a well appearing African-American male in no acute distress.  He is alert and oriented x4 and appropriate throughout the examination. HEENT reveals that the patient is normocephalic, atraumatic. EOMs are intact. PERRLA. Skin is intact without any evidence of gross lesions. Cardiovascular exam reveals a regular rate and rhythm, no clicks rubs or murmurs are auscultated. Chest is clear to auscultation bilaterally. Lymphatic assessment is performed and does not reveal any adenopathy in the cervical, supraclavicular, axillary, or inguinal chains.  Abdomen has active bowel sounds in all quadrants and is intact. The abdomen is soft, non tender, non distended. Lower extremities are negative for pretibial pitting edema, deep calf tenderness, cyanosis or clubbing.  KPS = 100  100 - Normal; no complaints; no evidence of disease. 90   - Able to carry on normal activity; minor signs or symptoms of disease. 80   - Normal activity with effort; some signs or symptoms of disease. 26   - Cares for self; unable to carry on normal activity or to do active work. 60   - Requires occasional assistance, but is able to care for most of his personal needs. 50   - Requires considerable assistance and frequent medical care. 85   - Disabled; requires special care and assistance. 26   - Severely disabled; hospital admission is indicated although death not imminent. 27   - Very sick; hospital admission necessary; active supportive treatment necessary. 10   - Moribund; fatal processes progressing rapidly. 0     - Dead  Karnofsky DA, Abelmann WH, Craver LS and Burchen[facility] [phone]) The use of the nitrogen mustards in the palliative treatment of carcinoma: with particular reference to bronchogenic  carcinoma Cancer 1 634-56   LABORATORY DATA:  Lab Results  Component Value Date   WBC 7.0 07/13/2016   HGB 13.5 07/13/2016   HCT 39.5 07/13/2016   MCV 92.3 07/13/2016   PLT 179 07/13/2016   Lab Results  Component Value Date   NA 138 07/13/2016   K 3.9 07/13/2016   CL 105 07/13/2016   CO2 25 07/13/2016   Lab Results  Component Value Date   ALT 41 06/06/2016   AST 32 06/06/2016   ALKPHOS 56 06/06/2016   BILITOT 0.6 06/06/2016     RADIOGRAPHY: Nm Bone Scan Whole Body  Result Date: 03/11/2017 CLINICAL DATA:  Prostate cancer, PSA 6.73, BILATERAL knee pain, recent RIGHT knee trauma EXAM: NUCLEAR MEDICINE WHOLE BODY BONE SCAN TECHNIQUE: Whole body anterior and posterior images were obtained approximately 3 hours after intravenous injection of  radiopharmaceutical. RADIOPHARMACEUTICALS:  19.8 mCi Technetium-32m MDP IV COMPARISON:  None Correlation: CT pelvis 03/11/2017 FINDINGS: Uptake at the shoulders, LEFT lateral cervical spine, LEFT wrist, and feet typically degenerative. Significant increased tracer localization at the RIGHT knee especially the medial compartment, likely degenerative though fractures and bone contusion may also cause bone scan positivity. Tiny nonspecific focus of tracer accumulation a at the lateral RIGHT orbit of uncertain etiology. No additional worrisome sites of abnormal osseous tracer accumulation are seen to suggest osseous metastases. Expected urinary tract and soft tissue distribution of tracer. IMPRESSION: Nonspecific increased tracer localization at the lateral RIGHT orbit. Scattered degenerative type uptake with additional significant tracer localization at the medial RIGHT knee, potentially degenerative though fracture and bone contusion can cause a similar appearance, recommend correlation with radiographs. Electronically Signed   By: Lavonia Dana M.D.   On: 03/11/2017 18:12      IMPRESSION/PLAN: This  is a 67 y.o.gentleman with stage T2a adenocarcinoma of the prostate with a Gleason's score of 4+4 and a PSA of 6.73.   We discussed the patient's workup and outlined the nature of prostate cancer in this setting.  Given the enlarged pelvic lymph nodes on his CT scan, consensus recommendation is to proceed with obtaining an [place] PET scan for further evaluation.  This information will help to guide final treatment recommendations.  The patient's T stage, Gleason's score, and PSA put him into the high risk group.  Accordingly he is eligible for a variety of potential treatment options including prostatectomy or LT-ADT with 8 weeks of external beam radiotherapy and final recommendations will be based on findings from his upcoming PET scan. We discussed and outlined the risks, benefits, short and long-term effects  associated with radiotherapy and compared and contrasted these with prostatectomy. We discussed the role of SpaceOAR in reducing the rectal toxicity associated with radiotherapy. We also detailed the role of ADT in the treatment of high risk prostate cancer and outlined the associated side effects that could be expected with this therapy.  At the end of the conversation the patient is interested in moving forward with [facility] PET scan as recommended and understands that final treatment recommendations will be based on any additional findings on this scan.  We will share our findings with Dr. Junious Silk.  At present, he is in favor of proceeding with LT-ADT in combination with 8 weeks of prostate IMRT.  We will plan to meet back with the patient following his PET scan to finalize our treatment recommendations and at that time, we will move forward with treatment planning accordingly.  If radiotherapy is indeed  felt to be a good  option for him, he is interested in having placement of SpaceOAR gel at the time of fiducial markers in the outpatient surgical setting.  We spent 60 minutes face to face with the patient and more than 50% of that time was spent in counseling and/or coordination of care.     Nicholos Johns, PA-C    Tyler Pita, MD  [facility] Direct Dial: [phone]  Fax: [phone] [email or web address]  Skype  LinkedIn

## 2017-04-02 NOTE — Consult Note (Signed)
Multi-Disciplinary Clinic     04/02/2017   --------------------------------------------------------------------------------   Jon Williams  MRN: [id number]  PRIMARY CARE:  Glendale Chard, MD  DOB: 1950/09/29, 67 year old Male  REFERRING:  Georgette Dover, MD  [phone]  PROVIDER:  Festus Aloe, M.D.    TREATING:  Raynelle Bring, M.D.    LOCATION:  [facility] [phone] [id number]   --------------------------------------------------------------------------------   CC/HPI: CC: Prostate Cancer   Physician requesting consult: Dr. Eda Keys  PCP: Dr. Glendale Chard  Location of consult: [facility]   Jon Williams is a 67 year old gentleman who was noted to have a rising PSA up to 6.73. He was also noted to have a right sided prostate nodule. He underwent a TRUS biopsy of the prostate on 02/20/17 that confirmed Gleason 4+4=8 adenocarcinoma of the prostate with 7 out of 12 biopsy cores positive for malignancy.   Family history: None.   Imaging studies:  CT pelvis (03/11/17): Concerning pelvic lymphadenopathy including a 10 mm left external iliac lymph node, 6 mm left common iliac lymph node, and a 12 mm right external iliac lymph node.  Bone scan (03/11/17): Negative for metastatic disease.   PMH: He has a history of atrial fibrillation, hypertension, hyperlipidemia.  PSH: No abdominal surgeries.   TNM stage: [id number]  PSA: 6.73  Gleason score: 4+4=8  Biopsy (02/20/17): 7/12 cores positive  Left: L lateral apex (10%, 3+3=6), L mid (5%, 3+3=6)  Right: R apex (10%, 3+4=7), R lateral apex (40%, 3+4=7), R mid (60%, 4+3=7), R lateral mid (80%, 4+3=7), R lateral base (60%, 4+4=8, PNI)  Prostate volume: 60.4 cc   Nomogram  OC disease: 16%  EPE: 82%  SVI: 32%  LNI: 33%  PFS (5 year, 10 year): 30%, 18%   Urinary function: IPSS is 5.  Erectile function: SHIM score is 6.     ALLERGIES: Aspirin TABS Sulfa  Drugs    MEDICATIONS: Atorvastatin Calcium 40 mg tablet  Carvedilol 3.125 mg tablet  Entresto 24 mg-26 mg tablet  Furosemide 40 mg tablet  Multi Vitamin  Omega 3  Potassium Chloride 10 meq capsule, extended release  Sotalol 120 mg tablet  Xarelto 20 mg tablet     GU PSH: Locm 300-399Mg /Ml Iodine,1Ml - 03/11/2017 Prostate Needle Biopsy - 02/20/2017      PSH Notes: cardiac cath   NON-GU PSH: Surgical Pathology, Gross And Microscopic Examination For Prostate Needle - 02/20/2017    GU PMH: Prostate Cancer - 03/13/2017 Elevated PSA - 02/20/2017, - 01/02/2017, - 04/02/2016, PSA elevation, - 05/31/2015 ED due to arterial insufficiency - 01/02/2017, - 04/02/2016 BPH w/LUTS, Benign prostatic hyperplasia with urinary obstruction - 2017 BPH w/o LUTS, Benign non-nodular prostatic hyperplasia without lower urinary tract symptoms - 2016 Acute prostatitis, Prostatitis, acute - 2014 Dysuria, Dysuria - 2014 Inflammatory Disease Prostate, Unspec, Prostatitis - 2014 Other microscopic hematuria, Microscopic hematuria - 2014 Urinary Tract Inf, Unspec site, Pyuria - 2014 Urinary Urgency, Urinary urgency - 2014    NON-GU PMH: Encounter for general adult medical examination without abnormal findings, Encounter for preventive health examination - 2017 Atrial Fibrillation    FAMILY HISTORY: Family Health Status Number - Runs In Family   SOCIAL HISTORY: Marital Status: Married Preferred Language: English; Ethnicity: Not Hispanic Or Latino; Race: Black or African American Current Smoking Status: Patient has never smoked.   Tobacco Use Assessment Completed: Used Tobacco in last 30 days? Has never drank.  Does not drink  caffeine.     Notes: Never smoker, Previous History Of Smoking, Caffeine Use, Marital History - Currently Married, Occupation:, Alcohol Use   REVIEW OF SYSTEMS:    GU Review Male:   Patient denies frequent urination, hard to postpone urination, burning/ pain with urination, get up at  night to urinate, leakage of urine, stream starts and stops, trouble starting your streams, and have to strain to urinate .  Gastrointestinal (Upper):   Patient denies nausea and vomiting.  Gastrointestinal (Lower):   Patient denies diarrhea and constipation.  Constitutional:   Patient denies night sweats, weight loss, fatigue, and fever.  Skin:   Patient denies skin rash/ lesion and itching.  Eyes:   Patient denies blurred vision and double vision.  Ears/ Nose/ Throat:   Patient denies sore throat and sinus problems.  Hematologic/Lymphatic:   Patient denies swollen glands and easy bruising.  Cardiovascular:   Patient denies leg swelling and chest pains.  Respiratory:   Patient denies cough and shortness of breath.  Endocrine:   Patient denies excessive thirst.  Musculoskeletal:   Patient denies back pain and joint pain.  Neurological:   Patient denies headaches and dizziness.  Psychologic:   Patient denies depression and anxiety.   VITAL SIGNS: None   MULTI-SYSTEM PHYSICAL EXAMINATION:    Constitutional: Well-nourished. No physical deformities. Normally developed. Good grooming.     PAST DATA REVIEWED:  Source Of History:  Patient  Lab Test Review:   PSA  Records Review:   Pathology Reports  Urine Test Review:   Urinalysis  X-Ray Review: C.T. Abdomen/Pelvis: Reviewed Films.  Bone Scan: Reviewed Films.     02/13/17 12/24/16 07/02/16 04/02/16 11/30/15 05/31/15 02/26/15 08/18/11  PSA  Total PSA 6.73 ng/mL 5.57 ng/mL 4.03 ng/dl 4.24 ng/dl 3.31 ng/dl 4.11  4.75  2.00   Free PSA 2.21 ng/mL 1.93 ng/mL 1.26 ng/dl   1.18     % Free PSA 33 % PSA 35 % PSA 31 %   29       PROCEDURES: None   ASSESSMENT:      ICD-10 Details  1 GU:   Prostate Cancer - C61 Stable   PLAN:           Document Letter(s):  Created for Patient: Clinical Summary         Notes:   1. Prostate cancer: We discussed the situation in detail today in the multidisciplinary clinic with Dr. Alen Blew and Dr. Tammi Klippel. He  certainly has concerning lymphadenopathy that may represent local regional disease. After review, it was recommended that he undergo a fluciclovine PET scan to further assess his lymph nodes.   The patient was counseled about the natural history of prostate cancer and the standard treatment options that are available for prostate cancer. It was explained to him how his age and life expectancy, clinical stage, Gleason score, and PSA affect his prognosis, the decision to proceed with additional staging studies, as well as how that information influences recommended treatment strategies. We discussed the roles for active surveillance, radiation therapy, surgical therapy, androgen deprivation, as well as ablative therapy options for the treatment of prostate cancer as appropriate to his individual cancer situation. We discussed the risks and benefits of these options with regard to their impact on cancer control and also in terms of potential adverse events, complications, and impact on quality of life particularly related to urinary and sexual function. The patient was encouraged to ask questions throughout the discussion today and all questions were answered  to his stated satisfaction. In addition, the patient was provided with and/or directed to appropriate resources and literature for further education about prostate cancer and treatment options.   I did not recommend primary surgical therapy considering the concern for lymph node positive disease and taking into consideration his age and life expectancy. He did not have particular interest in surgery. We did discuss the option of long-term androgen deprivation and radiation therapy with treatment of his lymph node disease. If his PET scan is positive, Dr. Tammi Klippel will alter his treatment planning accordingly. If he did have evidence to suggest more systemic, metastatic disease, he has talked to Dr. Alen Blew possibly about starting androgen deprivation therapy in  combination with abiraterone. Final recommendations will be pending his PET scan. I have notified Dr. Junious Silk of these recommendations and he does plan to proceed with scheduling this test.   Cc: Dr. Festus Aloe  Dr. Glendale Chard  Dr. Tyler Pita  Dr. Zola Button

## 2017-04-02 NOTE — Progress Notes (Signed)
                               Care Plan Summary  Name: Jon Williams DOB: August 25, 1950   Your Medical Team:   Urologist -  Dr. Raynelle Bring, [facility]  Radiation Oncologist - Dr. Tyler Pita, [facility]   Medical Oncologist - Dr. Zola Button, [facility]  Recommendations: 1) Axium PET  2) Androgen deprivation (hormone injections) with radiation  3) Pending results of PET may need abiraterone or chemo  * These recommendations are based on information available as of today's consult.      Recommendations may change depending on the results of further tests or exams  Next Steps: 1) Dr. Lyndal Rainbow office will schedule PET 2) Dr. Lyndal Rainbow office will schedule ADT and Dr. Tammi Klippel will schedule radiation.  When appointments need to be scheduled, you will be contacted by [facility] and/or [facility].  Patient was provided with business cards for all providers and a copy of "Fall Prevention Patient Safety Plan".   Questions?  Please do not hesitate to call Jon Rue, RN, BSN, OCN at [phone] any questions or concerns.  Jon Williams is your Oncology Nurse Navigator and is available to assist you while you're receiving your medical care at [facility].

## 2017-04-02 NOTE — Progress Notes (Signed)
Reason for Referral: Prostate cancer.  HPI: Jon Williams is a 67 year old gentleman currently of [place] where he lived the majority of his life.  He has history of coronary artery disease as well as cardiomyopathy with a pacemaker in place.  He is chronically anticoagulated with Xarelto.  He is in reasonable health without any recent exacerbations of his cardiac condition.  He is active and continues to exercise regularly although retired from work.  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  He was found to have an elevated PSA of 6.73 on a routine physical examination by Dr. Delfina Redwood.  He was referred to Dr. Junious Silk and found to have a abnormal digital rectal examination with a right prostate nodule.  A biopsy obtained on February 20, 2017 showed a Gleason score 4+4 = 8 in 1 core with 4 other cores of 4+3 = 7 on the right apex.  Overall there was high volume disease with extraprostatic extension.  Bone scan was obtained and showed no evidence of metastatic disease and a CT scan of the abdomen showed lymphadenopathy in 1 right and one left iliac node close to 1 cm.  Based on these findings patient referred to the prostate cancer multidisciplinary clinic.  He denies any urinary symptoms at this time including frequency, urgency or dysuria.  He does report very infrequent nocturia he takes Lasix.   He does not report any headaches, blurry vision, syncope or seizures. Does not report any fevers, chills or sweats.  Does not report any cough, wheezing or hemoptysis.  Does not report any chest pain, palpitation, orthopnea or leg edema.  Does not report any nausea, vomiting or abdominal pain.  She does not report any constipation or diarrhea.  Does not report any skeletal complaints.   Does not report any skin rashes or lesions.  Does not report any lymphadenopathy or petechiae.  Does not report any anxiety or depression.  Remaining review of systems is negative.     Past Medical History:  Diagnosis Date  . AICD (automatic cardioverter/defibrillator) present 05/25/2016   biv icd  . Arthritis    knee  . Chronic combined systolic and diastolic CHF (congestive heart failure) ([place])    Echo 1/18: Mild conc LVH, EF 15-20, severe diff HK, inf and inf-septal AK, Gr 3 DD, mild to mod MR, severe LAE, mod reduced RVSF, mod RAE, mild TR, PASP 50  . Coronary artery disease involving native coronary artery without angina pectoris 04/17/2016   LHC 1/18: pLCx 30, mLCx 20, mRCA 40, dRCA 20, LVEDP 23, mean RA 8, PA 42/20, PCWP 17  . LBBB (left bundle branch block)   . NICM (nonischemic cardiomyopathy) ([place])    Echo 1/18:  Mild conc LVH, EF 15-20, severe diff HK, inf and inf-septal AK, Gr 3 DD, mild to mod MR, severe LAE, mod reduced RVSF, mod RAE, mild TR, PASP 50  . Other secondary pulmonary hypertension ([place]) 04/17/2016  . Prostate cancer [facility])   :  Past Surgical History:  Procedure Laterality Date  . BIV ICD INSERTION CRT-D N/A 05/25/2016   Procedure: BiV ICD Insertion CRT-D;  Surgeon: [name], MD;  Location: [place] [facility];  Service: Cardiovascular;  Laterality: N/A;  . CARDIAC CATHETERIZATION N/A 03/02/2016   Procedure: Right/Left Heart Cath and Coronary Angiography;  Surgeon: Nelva Bush, MD;  Location: [place] [facility];  Service: Cardiovascular;  Laterality: N/A;  . CARDIOVERSION N/A 07/17/2016  Procedure: Cardioversion;  Surgeon: [name], MD;  Location: [place] [facility];  Service: Cardiovascular;  Laterality: N/A;  . PROSTATE BIOPSY    :   Current Outpatient Medications:  .  atorvastatin (LIPITOR) 40 MG tablet, Take 1 tablet (40 mg total) by mouth daily. (Patient taking differently: Take 20 mg by mouth every morning. ), Disp: 90 tablet, Rfl: 3 .  carvedilol (COREG) 3.125 MG tablet, , Disp: , Rfl: 0 .  cholecalciferol (VITAMIN D) 1000 units tablet, Take 1,000 Units by mouth daily., Disp: , Rfl:  .  furosemide (LASIX) 40 MG tablet,  Take 1 tablet (40 mg total) by mouth 2 (two) times daily., Disp: 60 tablet, Rfl: 8 .  Multiple Vitamin (ONE-A-DAY MENS PO), Take 1 tablet by mouth daily. , Disp: , Rfl:  .  Omega-3 Fatty Acids (OMEGA 3 PO), Take 1 tablet by mouth 2 (two) times daily. , Disp: , Rfl:  .  potassium chloride (K-DUR) 10 MEQ tablet, Take 20 mEq by mouth daily., Disp: , Rfl: 0 .  rivaroxaban (XARELTO) 20 MG TABS tablet, Take 1 tablet (20 mg total) by mouth daily with supper., Disp: 90 tablet, Rfl: 1 .  sacubitril-valsartan (ENTRESTO) 24-26 MG, Take 1 tablet by mouth 2 (two) times daily., Disp: 180 tablet, Rfl: 1 .  sotalol (BETAPACE) 80 MG tablet, Take 1.5 tablets (120 mg total) by mouth 2 (two) times daily. Please keep upcoming appt in March for future refills. Thank you, Disp: 270 tablet, Rfl: 1:  Allergies  Allergen Reactions  . Aspirin Anaphylaxis and Hives  . Sulfa Antibiotics Anaphylaxis and Hives  :  Family History  Problem Relation Age of Onset  . Hypertension Mother   . Heart disease Mother   . Diabetes Mother   . Diabetes Father   . Hypertension Father   . Cancer Father   . Healthy Sister   . Heart attack Brother   . Heart disease Brother 82       + tobacco  . Healthy Brother   :  Social History   Socioeconomic History  . Marital status: Married    Spouse name: Not on file  . Number of children: Not on file  . Years of education: Not on file  . Highest education level: Not on file  Social Needs  . Financial resource strain: Not on file  . Food insecurity - worry: Not on file  . Food insecurity - inability: Not on file  . Transportation needs - medical: Not on file  . Transportation needs - non-medical: Not on file  Occupational History  . Not on file  Tobacco Use  . Smoking status: Never Smoker  . Smokeless tobacco: Never Used  Substance and Sexual Activity  . Alcohol use: No  . Drug use: No  . Sexual activity: Not on file  Other Topics Concern  . Not on file  Social History  Narrative  . Not on file  :  Pertinent items are noted in HPI.  Exam: Alert, awake gentleman appeared without distress. ECOG 0 General appearance: alert and cooperative appeared younger than stated age. Throat: lips, mucosa, and tongue normal; teeth and gums normal.  No oral thrush or ulcers. Eyes: No scleral icterus.  Pupils are equal and round reactive to light. Resp: clear to auscultation bilaterally without wheezes, to percussion. Cardio: regular rate and rhythm, S1, S2 normal, no murmur, click.  GI: soft, non-tender; bowel sounds normal; no masses,  no organomegaly Skin: Skin color, texture, turgor  normal. No rashes or lesions Lymph nodes: Cervical, supraclavicular, and axillary nodes normal. Neurologic: Grossly normal without any motor, sensory deficits. Psychiatric: Appropriate mood and affect. Musculoskeletal: No joint deformity or effusion.  Full range of motion noted in all joints.   CBC    Component Value Date/Time   WBC 7.0 07/13/2016 1133   RBC 4.28 07/13/2016 1133   HGB 13.5 07/13/2016 1133   HGB 13.8 05/24/2016 0741   HCT 39.5 07/13/2016 1133   HCT 42.2 05/24/2016 0741   PLT 179 07/13/2016 1133   PLT 228 05/24/2016 0741   MCV 92.3 07/13/2016 1133   MCV 93 05/24/2016 0741   MCH 31.5 07/13/2016 1133   MCHC 34.2 07/13/2016 1133   RDW 13.3 07/13/2016 1133   RDW 14.6 05/24/2016 0741   LYMPHSABS 1.8 02/08/2016 1505   MONOABS 0.5 02/08/2016 1505   EOSABS 0.1 02/08/2016 1505   BASOSABS 0.0 02/08/2016 1505     Nm Bone Scan Whole Body  Result Date: 03/11/2017 CLINICAL DATA:  Prostate cancer, PSA 6.73, BILATERAL knee pain, recent RIGHT knee trauma EXAM: NUCLEAR MEDICINE WHOLE BODY BONE SCAN TECHNIQUE: Whole body anterior and posterior images were obtained approximately 3 hours after intravenous injection of radiopharmaceutical. RADIOPHARMACEUTICALS:  19.8 mCi Technetium-39m MDP IV COMPARISON:  None Correlation: CT pelvis 03/11/2017 FINDINGS: Uptake at the  shoulders, LEFT lateral cervical spine, LEFT wrist, and feet typically degenerative. Significant increased tracer localization at the RIGHT knee especially the medial compartment, likely degenerative though fractures and bone contusion may also cause bone scan positivity. Tiny nonspecific focus of tracer accumulation a at the lateral RIGHT orbit of uncertain etiology. No additional worrisome sites of abnormal osseous tracer accumulation are seen to suggest osseous metastases. Expected urinary tract and soft tissue distribution of tracer. IMPRESSION: Nonspecific increased tracer localization at the lateral RIGHT orbit. Scattered degenerative type uptake with additional significant tracer localization at the medial RIGHT knee, potentially degenerative though fracture and bone contusion can cause a similar appearance, recommend correlation with radiographs. Electronically Signed   By: Lavonia Dana M.D.   On: 03/11/2017 18:12    Assessment and Plan:   67 year old gentleman with the following issues:  1.  Prostate cancer diagnosed in January 2019.  He presented with a PSA of 6.73 and abnormal digital rectal examination and a new nodule on the right base.  Biopsy obtained on February 20, 2017 showed a Gleason score 4+4 = 8 at the right base with a Gleason score 7 and 4 other cores.  He had also 2 other cores at the left side with a Gleason score of 3+3 = 6.  His case was discussed today in the prostate cancer multidisciplinary clinic.  His pathology was reviewed and discussed with the reviewing pathologist.  His bone scan and CT scan were discussed with radiology.  His staging workup did not show any evidence of bony disease but his CT scan did show some concerning lymphadenopathy with left external iliac lymph node measuring 10 mm and a right iliac lymph node measuring 12 mm.  The natural course of this disease was discussed today with the patient as well as treatment strategies were reviewed.  It is essential at  this time to fully stage him given the likelihood that he has a locally advanced disease with regional lymphadenopathy he is at high risk of having metastatic disease as well.  Axumin PET scan would be helpful in the setting to determine extent of his local and regional lymphadenopathy and to rule out distant  metastasis.  If he has local regional disease, definitive radiation therapy with 2-3 years of androgen deprivation would be the standard of care.  If he has more extensive metastatic disease with lymphadenopathy and possible bone metastasis, systemic therapy with androgen deprivation in addition to [facility] or chemotherapy may be needed.  Complication associated with androgen deprivation was reviewed today which include weight gain, hot flashes, loss of muscle mass, hyperlipidemia, hyperglycemia among other issues.  All his questions were answered today to his satisfaction.  2.  Follow-up: Medical oncology follow-up will be needed if he has more advanced disease on his imaging studies.  30  minutes was spent with the patient face-to-face today.  More than 50% of time was dedicated to patient counseling, education and coordination of his multifaceted care.

## 2017-04-02 NOTE — Telephone Encounter (Signed)
Jon Williams called to confirm his appointment for the [facility] today. He states he was out of town when I called him yesterday.

## 2017-04-03 ENCOUNTER — Telehealth: Payer: Self-pay

## 2017-04-03 NOTE — Telephone Encounter (Signed)
NO los per 2/19 

## 2017-04-04 ENCOUNTER — Encounter: Payer: Self-pay | Admitting: General Practice

## 2017-04-04 NOTE — Progress Notes (Signed)
[place] presented to [facility], receiving packet of [place] and completing distress screen per protocol.  The patient scored a 0 on the Psychosocial Distress Thermometer which indicates minimal distress.   ONCBCN DISTRESS SCREENING 04/04/2017  Screening Type Initial Screening  Distress experienced in past week (1-10) 0  Referral to support programs Yes    Follow up needed: No. Per prostate navigator Jabier Gauss, pt reports good support from his wife and overall positive attitude. Couple is aware of ongoing Support Team availability, but please also page if immediate needs arise or circumstances change. Thank you.   [place], [place], [facility] Pager [phone] Voicemail [phone]

## 2017-04-05 ENCOUNTER — Other Ambulatory Visit: Payer: Self-pay | Admitting: Urology

## 2017-04-05 DIAGNOSIS — C61 Malignant neoplasm of prostate: Secondary | ICD-10-CM

## 2017-04-08 ENCOUNTER — Telehealth: Payer: Self-pay | Admitting: Medical Oncology

## 2017-04-08 NOTE — Telephone Encounter (Signed)
Wife called asking about getting FMLA paperwork completed. I explained she can bring with her when patient has follow up with Dr. Tammi Klippel. She understands she may not need to be out of work a lot but she would like to be able to attend the follow up consult. Pt is scheduled for PET 04/11/17 and then Dr. Johny Shears will schedule the follow up appointment to discuss.

## 2017-04-11 ENCOUNTER — Ambulatory Visit (HOSPITAL_COMMUNITY)
Admission: RE | Admit: 2017-04-11 | Discharge: 2017-04-11 | Disposition: A | Discharge status: Home / Self Care | Payer: Medicare HMO | Source: Ambulatory Visit | Attending: Urology | Admitting: Urology

## 2017-04-11 DIAGNOSIS — C61 Malignant neoplasm of prostate: Secondary | ICD-10-CM | POA: Diagnosis not present

## 2017-04-11 DIAGNOSIS — Z0100 Encounter for examination of eyes and vision without abnormal findings: Secondary | ICD-10-CM | POA: Diagnosis not present

## 2017-04-11 MED ORDER — AXUMIN (FLUCICLOVINE F 18) INJECTION
9.2400 | Freq: Once | INTRAVENOUS | Status: AC
  Administered 2017-04-11: 9.24 via INTRAVENOUS

## 2017-04-12 ENCOUNTER — Ambulatory Visit: Payer: Medicare HMO | Admitting: Internal Medicine

## 2017-04-12 ENCOUNTER — Encounter: Payer: Self-pay | Admitting: Urology

## 2017-04-12 ENCOUNTER — Encounter: Payer: Self-pay | Admitting: Internal Medicine

## 2017-04-12 VITALS — BP 125/84 | HR 70 | Ht 66.0 in | Wt 214.0 lb

## 2017-04-12 DIAGNOSIS — I5042 Chronic combined systolic (congestive) and diastolic (congestive) heart failure: Secondary | ICD-10-CM

## 2017-04-12 DIAGNOSIS — Z5181 Encounter for therapeutic drug level monitoring: Secondary | ICD-10-CM | POA: Diagnosis not present

## 2017-04-12 DIAGNOSIS — I4819 Other persistent atrial fibrillation: Secondary | ICD-10-CM

## 2017-04-12 DIAGNOSIS — I2510 Atherosclerotic heart disease of native coronary artery without angina pectoris: Secondary | ICD-10-CM

## 2017-04-12 DIAGNOSIS — Z79899 Other long term (current) drug therapy: Secondary | ICD-10-CM

## 2017-04-12 DIAGNOSIS — I481 Persistent atrial fibrillation: Secondary | ICD-10-CM | POA: Diagnosis not present

## 2017-04-12 DIAGNOSIS — I428 Other cardiomyopathies: Secondary | ICD-10-CM

## 2017-04-12 DIAGNOSIS — Z95810 Presence of automatic (implantable) cardiac defibrillator: Secondary | ICD-10-CM

## 2017-04-12 LAB — CUP PACEART INCLINIC DEVICE CHECK
Battery Remaining Longevity: 69 mo
Brady Statistic RA Percent Paced: 15 %
Brady Statistic RV Percent Paced: 98 %
Date Time Interrogation Session: 20190301113602
HighPow Impedance: 68.625
Implantable Lead Implant Date: 20180413
Implantable Lead Implant Date: 20180413
Implantable Lead Implant Date: 20180413
Implantable Lead Location: 753858
Implantable Lead Location: 753859
Implantable Lead Location: 753860
Implantable Lead Model: 7122
Implantable Pulse Generator Implant Date: 20180413
Lead Channel Impedance Value: 450 Ohm
Lead Channel Impedance Value: 475 Ohm
Lead Channel Impedance Value: 862.5 Ohm
Lead Channel Pacing Threshold Amplitude: 0.75 V
Lead Channel Pacing Threshold Amplitude: 0.75 V
Lead Channel Pacing Threshold Amplitude: 0.75 V
Lead Channel Pacing Threshold Amplitude: 0.75 V
Lead Channel Pacing Threshold Pulse Width: 0.5 ms
Lead Channel Pacing Threshold Pulse Width: 0.5 ms
Lead Channel Pacing Threshold Pulse Width: 0.5 ms
Lead Channel Pacing Threshold Pulse Width: 0.5 ms
Lead Channel Sensing Intrinsic Amplitude: 0.8 mV
Lead Channel Sensing Intrinsic Amplitude: 11.7 mV
Lead Channel Setting Pacing Amplitude: 2 V
Lead Channel Setting Pacing Amplitude: 2.5 V
Lead Channel Setting Pacing Amplitude: 2.5 V
Lead Channel Setting Pacing Pulse Width: 0.5 ms
Lead Channel Setting Pacing Pulse Width: 0.5 ms
Lead Channel Setting Sensing Sensitivity: 0.5 mV
Pulse Gen Serial Number: 7398151

## 2017-04-12 MED ORDER — RIVAROXABAN 20 MG PO TABS: 20.0000 mg | ORAL_TABLET | Freq: Every day | ORAL | 3 refills | Status: DC

## 2017-04-12 MED ORDER — ATORVASTATIN CALCIUM 40 MG PO TABS: 40.0000 mg | ORAL_TABLET | Freq: Every day | ORAL | 3 refills | Status: DC

## 2017-04-12 MED ORDER — SACUBITRIL-VALSARTAN 24-26 MG PO TABS: 1.0000 | ORAL_TABLET | Freq: Two times a day (BID) | ORAL | 3 refills | Status: DC

## 2017-04-12 NOTE — Patient Instructions (Signed)
Medication Instructions:  Your physician recommends that you continue on your current medications as directed. Please refer to the Current Medication list given to you today.  Labwork: None ordered.  Testing/Procedures: None ordered.  Follow-Up: Your physician wants you to follow-up in: one year with Dr. Lovena Le.  You will receive a reminder letter in the mail two months in advance. If you don't receive a letter, please call our office to schedule the follow-up appointment.  Remote monitoring is used to monitor your ICD from home. This monitoring reduces the number of office visits required to check your device to one time per year. It allows [place] to keep an eye on the functioning of your device to ensure it is working properly. You are scheduled for a device check from home on 05/08/2017. You may send your transmission at any time that day. If you have a wireless device, the transmission will be sent automatically. After your physician reviews your transmission, you will receive a postcard with your next transmission date.  Any Other Special Instructions Will Be Listed Below (If Applicable).  If you need a refill on your cardiac medications before your next appointment, please call your pharmacy.

## 2017-04-13 ENCOUNTER — Encounter: Payer: Self-pay | Admitting: Internal Medicine

## 2017-04-13 NOTE — Progress Notes (Signed)
HPI Mr. Ghosh returns today for followup of his atrial fib and CHF, s/p ICD insertion. He is a pleasant 67 yo man with the above problems, who underwent biv ICD insertion almost a year ago. In the interim, he has developed prostate CA. He has not had syncope. No ICD shocks.  Allergies  Allergen Reactions  . Aspirin Anaphylaxis and Hives  . Sulfa Antibiotics Anaphylaxis and Hives     Current Outpatient Medications  Medication Sig Dispense Refill  . atorvastatin (LIPITOR) 40 MG tablet Take 1 tablet (40 mg total) by mouth daily. 90 tablet 3  . carvedilol (COREG) 3.125 MG tablet   0  . cholecalciferol (VITAMIN D) 1000 units tablet Take 1,000 Units by mouth daily.    . furosemide (LASIX) 40 MG tablet Take 1 tablet (40 mg total) by mouth 2 (two) times daily. 60 tablet 8  . Multiple Vitamin (ONE-A-DAY MENS PO) Take 1 tablet by mouth daily.     . Omega-3 Fatty Acids (OMEGA 3 PO) Take 1 tablet by mouth 2 (two) times daily.     . potassium chloride (K-DUR) 10 MEQ tablet Take 20 mEq by mouth daily.  0  . rivaroxaban (XARELTO) 20 MG TABS tablet Take 1 tablet (20 mg total) by mouth daily with supper. 90 tablet 3  . sacubitril-valsartan (ENTRESTO) 24-26 MG Take 1 tablet by mouth 2 (two) times daily. 180 tablet 3  . sotalol (BETAPACE) 80 MG tablet Take 1.5 tablets (120 mg total) by mouth 2 (two) times daily. Please keep upcoming appt in March for future refills. Thank you 270 tablet 1   No current facility-administered medications for this visit.      Past Medical History:  Diagnosis Date  . AICD (automatic cardioverter/defibrillator) present 05/25/2016   biv icd  . Arthritis    knee  . Chronic combined systolic and diastolic CHF (congestive heart failure) ([place])    Echo 1/18: Mild conc LVH, EF 15-20, severe diff HK, inf and inf-septal AK, Gr 3 DD, mild to mod MR, severe LAE, mod reduced RVSF, mod RAE, mild TR, PASP 50  . Coronary artery disease involving native coronary artery without  angina pectoris 04/17/2016   LHC 1/18: pLCx 30, mLCx 20, mRCA 40, dRCA 20, LVEDP 23, mean RA 8, PA 42/20, PCWP 17  . LBBB (left bundle branch block)   . NICM (nonischemic cardiomyopathy) ([place])    Echo 1/18:  Mild conc LVH, EF 15-20, severe diff HK, inf and inf-septal AK, Gr 3 DD, mild to mod MR, severe LAE, mod reduced RVSF, mod RAE, mild TR, PASP 50  . Other secondary pulmonary hypertension ([place]) 04/17/2016  . Prostate cancer ([place])     ROS:   All systems reviewed and negative except as noted in the HPI.   Past Surgical History:  Procedure Laterality Date  . BIV ICD INSERTION CRT-D N/A 05/25/2016   Procedure: BiV ICD Insertion CRT-D;  Surgeon: [name], MD;  Location: [facility];  Service: Cardiovascular;  Laterality: N/A;  . CARDIAC CATHETERIZATION N/A 03/02/2016   Procedure: Right/Left Heart Cath and Coronary Angiography;  Surgeon: Nelva Bush, MD;  Location: [facility];  Service: Cardiovascular;  Laterality: N/A;  . CARDIOVERSION N/A 07/17/2016   Procedure: Cardioversion;  Surgeon: [name], MD;  Location: [facility];  Service: Cardiovascular;  Laterality: N/A;  . PROSTATE BIOPSY       Family History  Problem Relation Age of Onset  .  Hypertension Mother   . Heart disease Mother   . Diabetes Mother   . Diabetes Father   . Hypertension Father   . Cancer Father   . Healthy Sister   . Heart attack Brother   . Heart disease Brother 38       + tobacco  . Healthy Brother      Social History   Socioeconomic History  . Marital status: Married    Spouse name: Not on file  . Number of children: Not on file  . Years of education: Not on file  . Highest education level: Not on file  Social Needs  . Financial resource strain: Not on file  . Food insecurity - worry: Not on file  . Food insecurity - inability: Not on file  . Transportation needs - medical: Not on file  . Transportation needs - non-medical: Not on file  Occupational History    . Not on file  Tobacco Use  . Smoking status: Never Smoker  . Smokeless tobacco: Never Used  Substance and Sexual Activity  . Alcohol use: No  . Drug use: No  . Sexual activity: No  Other Topics Concern  . 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 Daughter, Beckie Busing, lives in [place]. Son, [place], lives in [place].     BP 125/84   Pulse 70   Ht 5\' 6"  (1.676 m)   Wt 214 lb (97.1 kg)   BMI 34.54 kg/m   Physical Exam:  Well appearing 67 yo man, NAD HEENT: Unremarkable Neck:  No JVD, no thyromegally Lymphatics:  No adenopathy Back:  No CVA tenderness Lungs:  Clear with no wheezes HEART:  Regular rate rhythm, no murmurs, no rubs, no clicks Abd:  soft, positive bowel sounds, no organomegally, no rebound, no guarding Ext:  2 plus pulses, no edema, no cyanosis, no clubbing Skin:  No rashes no nodules Neuro:  CN II through XII intact, motor grossly intact  EKG - atrial fib with biv pacing  DEVICE  Normal device function.  See PaceArt for details.   Assess/Plan: 1. Persistent atrial fib - he is back in NSR but his rate is well controlled. I am inclined not to recommend additional Sotalol or try another AA drug. Might ultimately stop his sotalol. 2. BiV ICD - his [employer] Jude device is working normally. Will recheck in several months. 3. Chronic systolic heart failure. He is taking Entresto and feels better. I have encouraged him to continue.  4. HTN heart disease - his blood pressure is well controlled on medical therapy.  He is encouraged to reduce his salt intake.  Mikle Bosworth.D.

## 2017-04-15 ENCOUNTER — Telehealth: Payer: Self-pay | Admitting: *Deleted

## 2017-04-15 NOTE — Telephone Encounter (Signed)
    [facility] Pre-operative Risk Assessment    Request for surgical clearance:  1. What type of surgery is being performed? Gold seed placement   2. When is this surgery scheduled? TBD   3. What type of clearance is required (medical clearance vs. Pharmacy clearance to hold med vs. Both)?  Medical and Pharmacy  4. Are there any medications that need to be held prior to surgery and how long? Xarelto, 5 days prior   5. Practice name and name of physician performing surgery? [facility], Dr. Junious Silk   6. What is your office phone and fax number?   P: [phone] F: [phone]  7. Anesthesia type (None, local, MAC, general) ? Not specified    Jon Williams D 04/15/2017, 6:01 PM  _________________________________________________________________   (provider comments below)

## 2017-04-16 ENCOUNTER — Telehealth: Payer: Self-pay | Admitting: *Deleted

## 2017-04-16 NOTE — Telephone Encounter (Signed)
CALLED PATIENT TO INFORM OF ADT APPT.ON 05-07-17 @ 8:30 AM @ DR. Lyndal Williams OFFICE, SPOKE WITH PATIENT AND HE IS AWARE OF THIS APPT.

## 2017-04-16 NOTE — Telephone Encounter (Signed)
Routed to our pharmacist for anticoagulation recommendations.   Pt was just seen 4 days ago by Dr. Lovena Le. I will ask for his input on clearance.   Dr. Lovena Le, please comment on clearance for this pt and route back to   p CV DIV Preop pool.

## 2017-04-17 NOTE — Telephone Encounter (Signed)
   Chart reviewed as part of pre-operative protocol coverage. It appears yesterday's APP recommended to route to Dr. Lovena Le for review, do not see this made it through so will route. Dr. Lovena Le, please weigh in on clearance for seed placement as you recently saw patient just a few days ago. Please route reply to P CV DIV PREOP, thanks.  Charlie Pitter, PA-C 04/17/2017, 2:05 PM

## 2017-04-17 NOTE — Telephone Encounter (Signed)
Patient with diagnosis of Afib on Xarelto for anticoagulation.    Procedure: gold seed placement Date of procedure: TBD  CHADS2-VASc score of  4 (CHF, HTN, AGE, DM2, stroke/tia x 2, CAD, AGE, male)  CrCl 52ml/min  Per office protocol, patient can hold Xarelto for 3 days prior to procedure due to high risk bleeding procedure.

## 2017-04-18 NOTE — Telephone Encounter (Signed)
New Message    Patient last seen Jon Williams 04/12/17      [facility] Pre-operative Risk Assessment    Request for surgical clearance:  1. What type of surgery is being performed? Gold seed placement  2. When is this surgery scheduled? March 26th,2019  3. What type of clearance is required (medical clearance vs. Pharmacy clearance to hold med vs. Both)? Pharmacy clearance   4. Are there any medications that need to be held prior to surgery and how long?Xarelto - 5 day prior to procedure   5. Practice name and name of physician performing surgery?  Dr Junious Silk   What is your office phone and fax number?  [phone] fax [phone]    6. Anesthesia type (None, local, MAC, general) ?  Local    Jon Williams 04/18/2017, 9:06 AM  _________________________________________________________________   (provider comments below)

## 2017-04-19 ENCOUNTER — Ambulatory Visit: Payer: Medicare HMO | Admitting: Oncology

## 2017-04-19 NOTE — Telephone Encounter (Signed)
Per Dr. Lovena Le- "OK to proceed with implant"  Forwarding to preop pool.

## 2017-04-19 NOTE — Telephone Encounter (Signed)
Anticoag was already addressed before duplicate clearance request was added.

## 2017-04-23 ENCOUNTER — Ambulatory Visit: Payer: Medicare HMO | Admitting: Podiatry

## 2017-04-23 ENCOUNTER — Encounter: Payer: Self-pay | Admitting: Podiatry

## 2017-04-23 DIAGNOSIS — M79675 Pain in left toe(s): Secondary | ICD-10-CM

## 2017-04-23 DIAGNOSIS — Q828 Other specified congenital malformations of skin: Secondary | ICD-10-CM | POA: Diagnosis not present

## 2017-04-23 DIAGNOSIS — M79674 Pain in right toe(s): Secondary | ICD-10-CM

## 2017-04-23 DIAGNOSIS — B351 Tinea unguium: Secondary | ICD-10-CM

## 2017-04-23 DIAGNOSIS — D689 Coagulation defect, unspecified: Secondary | ICD-10-CM | POA: Diagnosis not present

## 2017-04-23 NOTE — Progress Notes (Signed)
Patient ID: JEVONTE CLANTON, male   DOB: Aug 02, 1950, 67 y.o.   MRN: [id number] Complaint:  Visit Type: Patient returns to my office for continued preventative foot care services. Complaint: Patient states" my nails have grown long and thick and become painful to walk and wear shoes" . The patient presents for preventative foot care services. No changes to ROS.  He states he develops severe callus between his first and second toes but he has been working in it and it has improved..  Podiatric Exam: Vascular: dorsalis pedis and posterior tibial pulses are palpable bilateral. Capillary return is immediate. Temperature gradient is WNL. Skin turgor WNL  Sensorium: Normal Semmes Weinstein monofilament test. Normal tactile sensation bilaterally. Nail Exam: Pt has thick disfigured discolored nails with subungual debris noted bilateral entire nail hallux through fifth toenails Ulcer Exam: There is no evidence of ulcer or pre-ulcerative changes or infection. Orthopedic Exam: Muscle tone and strength are WNL. No limitations in general ROM. No crepitus or effusions noted. Foot type and digits show no abnormalities. Bony prominences are unremarkable. 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 Skin: No Porokeratosis. No infection or ulcer.   callus noted 1/2  left.  Diagnosis:  Onychomycosis, , Pain in right toe, pain in left toes  Callus 1/2  Left only   Gardiner Barefoot DPM Treatment & Plan Procedures and Treatment: Consent by patient was obtained for treatment procedures. The patient understood the discussion of treatment and procedures well. All questions were answered thoroughly reviewed. Debridement of mycotic and hypertrophic toenails, 1 through 5 bilateral and clearing of subungual debris. No ulceration, no infection noted.  Debride corn/ulcer left  first interspace. Return Visit-Office Procedure: Patient instructed to return to the office for a follow up visit 9 weeks. for continued evaluation and  treatment.   Gardiner Barefoot DPM

## 2017-04-24 NOTE — Telephone Encounter (Signed)
Printed off clearance info and hard faxed to requesting surgeons office. Left message for Lauren stating I had faxed information and if she had any questions to please call the office back.

## 2017-04-24 NOTE — Telephone Encounter (Signed)
Follow up     Lauren calling from [facility] checking on cardiac clearance .

## 2017-04-29 ENCOUNTER — Telehealth: Payer: Self-pay | Admitting: Medical Oncology

## 2017-04-29 NOTE — Telephone Encounter (Signed)
Spoke with Jon Williams to confirm his appointments for androgen deprivation and gold markers 05/07/17. He confirmed and states "I am ready to go". I will continue to follow and asked him to call me with questions or concerns.

## 2017-05-03 NOTE — Progress Notes (Signed)
Patient was informed that his Axumin PET scan did not demonstrate any evidence of distant metastasis.  He does have locally advanced disease with involvement of the right seminal vesicle, bilateral external iliac lymph nodes and left common iliac lymph nodes to the level of the aortic bifurcation but no metastatic adenopathy in the upper abdomen or skeletal disease identified.  Therefore, the recommendation is to proceed as originally discussed, with long-term androgen deprivation therapy in combination with 8 weeks of prostate IMRT.   He is scheduled to start ADT on 05/07/2017.  We will move forward with coordinating an outpatient surgical procedure for placement of SpaceOAR and fiducial markers within the next 4-6 weeks in anticipation of beginning prostate radiation after approximately 8 weeks of androgen deprivation therapy.  The patient is comfortable with and is in agreement with this plan.   Nicholos Johns, PA-C

## 2017-05-07 DIAGNOSIS — C61 Malignant neoplasm of prostate: Secondary | ICD-10-CM | POA: Diagnosis not present

## 2017-05-08 ENCOUNTER — Ambulatory Visit (INDEPENDENT_AMBULATORY_CARE_PROVIDER_SITE_OTHER): Payer: Medicare HMO | Admitting: *Deleted

## 2017-05-08 DIAGNOSIS — I428 Other cardiomyopathies: Secondary | ICD-10-CM

## 2017-05-08 NOTE — Progress Notes (Signed)
Remote ICD transmission.   

## 2017-05-13 ENCOUNTER — Telehealth: Payer: Self-pay

## 2017-05-13 ENCOUNTER — Encounter: Payer: Self-pay | Admitting: Cardiology

## 2017-05-13 ENCOUNTER — Other Ambulatory Visit: Payer: Self-pay | Admitting: Gastroenterology

## 2017-05-13 DIAGNOSIS — I4891 Unspecified atrial fibrillation: Secondary | ICD-10-CM | POA: Diagnosis not present

## 2017-05-13 DIAGNOSIS — C61 Malignant neoplasm of prostate: Secondary | ICD-10-CM | POA: Diagnosis not present

## 2017-05-13 DIAGNOSIS — Z86010 Personal history of colonic polyps: Secondary | ICD-10-CM | POA: Diagnosis not present

## 2017-05-13 NOTE — Telephone Encounter (Signed)
   [facility] Pre-operative Risk Assessment    Request for surgical clearance:  1. What type of surgery is being performed? colonoscopy   2. When is this surgery scheduled? 06/28/2017   3. What type of clearance is required (medical clearance vs. Pharmacy clearance to hold med vs. Both)? medication  4. Are there any medications that need to be held prior to surgery and how long?xarelto   5. Practice name and name of physician performing surgery? [facility]   6. What is your office phone and fax number? [phone]  [phone]   7. Anesthesia type (None, local, MAC, general) ? Gwyneth Sprout  Melonee Gerstel 05/13/2017, 1:07 PM  _________________________________________________________________   (provider comments below)

## 2017-05-16 ENCOUNTER — Other Ambulatory Visit: Payer: Self-pay | Admitting: Urology

## 2017-05-16 NOTE — Telephone Encounter (Signed)
Patient with diagnosis of atrial fibrillation on Xarelto for anticoagulation.    Procedure: colonoscopy Date of procedure: 06/28/17  CHADS2-VASc score of  3 (CHF, , AGE, , CAD, )  CrCl 82.3 Platelet count 179  Per office protocol, patient can hold Xarelto for 1 days prior to procedure.    Patient should restart Xarelto on the evening of procedure or day after, at discretion of procedure MD

## 2017-05-16 NOTE — Telephone Encounter (Signed)
Seen recently by Dr Lovena Le and felt to be stable. I will route to pharmacy for Xarelto recommendations-otherwise cleared from a cardiac standpoint.  Kerin Ransom PA-C 05/16/2017 1:33 PM

## 2017-05-20 ENCOUNTER — Telehealth: Payer: Self-pay | Admitting: Internal Medicine

## 2017-05-20 LAB — CUP PACEART REMOTE DEVICE CHECK
Battery Remaining Longevity: 70 mo
Battery Remaining Percentage: 82 %
Battery Voltage: 2.98 V
Brady Statistic AP VP Percent: 88 %
Brady Statistic AP VS Percent: 1 %
Brady Statistic AS VP Percent: 10 %
Brady Statistic AS VS Percent: 1 %
Brady Statistic RA Percent Paced: 14 %
Date Time Interrogation Session: 20190327154836
HighPow Impedance: 73 Ohm
HighPow Impedance: 73 Ohm
Implantable Lead Implant Date: 20180413
Implantable Lead Implant Date: 20180413
Implantable Lead Implant Date: 20180413
Implantable Lead Location: 753858
Implantable Lead Location: 753859
Implantable Lead Location: 753860
Implantable Lead Model: 7122
Implantable Pulse Generator Implant Date: 20180413
Lead Channel Impedance Value: 430 Ohm
Lead Channel Impedance Value: 510 Ohm
Lead Channel Impedance Value: 860 Ohm
Lead Channel Pacing Threshold Amplitude: 0.75 V
Lead Channel Pacing Threshold Amplitude: 0.75 V
Lead Channel Pacing Threshold Amplitude: 1 V
Lead Channel Pacing Threshold Pulse Width: 0.5 ms
Lead Channel Pacing Threshold Pulse Width: 0.5 ms
Lead Channel Pacing Threshold Pulse Width: 0.5 ms
Lead Channel Sensing Intrinsic Amplitude: 0.8 mV
Lead Channel Sensing Intrinsic Amplitude: 8.1 mV
Lead Channel Setting Pacing Amplitude: 2 V
Lead Channel Setting Pacing Amplitude: 2.5 V
Lead Channel Setting Pacing Amplitude: 2.5 V
Lead Channel Setting Pacing Pulse Width: 0.5 ms
Lead Channel Setting Pacing Pulse Width: 0.5 ms
Lead Channel Setting Sensing Sensitivity: 0.5 mV
Pulse Gen Serial Number: 7398151

## 2017-05-21 NOTE — Telephone Encounter (Signed)
Closed Encounter  °

## 2017-06-05 ENCOUNTER — Ambulatory Visit: Payer: Medicare HMO

## 2017-06-07 ENCOUNTER — Other Ambulatory Visit: Payer: Self-pay | Admitting: Physician Assistant

## 2017-06-19 ENCOUNTER — Other Ambulatory Visit: Payer: Self-pay | Admitting: Internal Medicine

## 2017-06-20 ENCOUNTER — Encounter (HOSPITAL_COMMUNITY): Payer: Self-pay | Admitting: *Deleted

## 2017-06-20 ENCOUNTER — Other Ambulatory Visit: Payer: Self-pay

## 2017-06-27 ENCOUNTER — Ambulatory Visit
Admission: RE | Admit: 2017-06-27 | Discharge: 2017-06-27 | Disposition: A | Discharge status: Home / Self Care | Payer: Medicare HMO | Source: Ambulatory Visit | Attending: Internal Medicine | Admitting: Internal Medicine

## 2017-06-27 ENCOUNTER — Other Ambulatory Visit: Payer: Self-pay | Admitting: Internal Medicine

## 2017-06-27 ENCOUNTER — Encounter: Payer: Self-pay | Admitting: Urology

## 2017-06-27 DIAGNOSIS — M25561 Pain in right knee: Secondary | ICD-10-CM

## 2017-06-27 DIAGNOSIS — M1711 Unilateral primary osteoarthritis, right knee: Secondary | ICD-10-CM | POA: Diagnosis not present

## 2017-06-27 NOTE — Progress Notes (Signed)
This patient is unable to undergo MRI of the prostate for verification of SpaceOAR placement status post fiducial marker and SpaceOAR procedure due to having an implantable cardiac defibrillator.

## 2017-06-28 ENCOUNTER — Ambulatory Visit (HOSPITAL_COMMUNITY): Payer: Medicare HMO | Admitting: Anesthesiology

## 2017-06-28 ENCOUNTER — Encounter (HOSPITAL_COMMUNITY): Payer: Self-pay | Admitting: Anesthesiology

## 2017-06-28 ENCOUNTER — Ambulatory Visit (HOSPITAL_COMMUNITY)
Admission: RE | Admit: 2017-06-28 | Discharge: 2017-06-28 | Disposition: A | Discharge status: Home / Self Care | Payer: Medicare HMO | Source: Ambulatory Visit | Attending: Gastroenterology | Admitting: Gastroenterology

## 2017-06-28 ENCOUNTER — Encounter (HOSPITAL_COMMUNITY)
Admission: RE | Disposition: A | Discharge status: Home / Self Care | Payer: Self-pay | Source: Ambulatory Visit | Attending: Gastroenterology

## 2017-06-28 ENCOUNTER — Telehealth: Payer: Self-pay | Admitting: *Deleted

## 2017-06-28 ENCOUNTER — Other Ambulatory Visit: Payer: Self-pay

## 2017-06-28 DIAGNOSIS — Z87892 Personal history of anaphylaxis: Secondary | ICD-10-CM | POA: Insufficient documentation

## 2017-06-28 DIAGNOSIS — Z882 Allergy status to sulfonamides status: Secondary | ICD-10-CM | POA: Diagnosis not present

## 2017-06-28 DIAGNOSIS — I2510 Atherosclerotic heart disease of native coronary artery without angina pectoris: Secondary | ICD-10-CM | POA: Insufficient documentation

## 2017-06-28 DIAGNOSIS — Z860100 Personal history of colon polyps, unspecified: Secondary | ICD-10-CM

## 2017-06-28 DIAGNOSIS — K5730 Diverticulosis of large intestine without perforation or abscess without bleeding: Secondary | ICD-10-CM | POA: Diagnosis not present

## 2017-06-28 DIAGNOSIS — Z1211 Encounter for screening for malignant neoplasm of colon: Secondary | ICD-10-CM | POA: Insufficient documentation

## 2017-06-28 DIAGNOSIS — I110 Hypertensive heart disease with heart failure: Secondary | ICD-10-CM | POA: Diagnosis not present

## 2017-06-28 DIAGNOSIS — D122 Benign neoplasm of ascending colon: Secondary | ICD-10-CM | POA: Insufficient documentation

## 2017-06-28 DIAGNOSIS — Z886 Allergy status to analgesic agent status: Secondary | ICD-10-CM | POA: Diagnosis not present

## 2017-06-28 DIAGNOSIS — I5042 Chronic combined systolic (congestive) and diastolic (congestive) heart failure: Secondary | ICD-10-CM | POA: Diagnosis not present

## 2017-06-28 DIAGNOSIS — I428 Other cardiomyopathies: Secondary | ICD-10-CM | POA: Insufficient documentation

## 2017-06-28 DIAGNOSIS — Z95810 Presence of automatic (implantable) cardiac defibrillator: Secondary | ICD-10-CM | POA: Diagnosis not present

## 2017-06-28 DIAGNOSIS — D124 Benign neoplasm of descending colon: Secondary | ICD-10-CM | POA: Insufficient documentation

## 2017-06-28 DIAGNOSIS — Z8546 Personal history of malignant neoplasm of prostate: Secondary | ICD-10-CM | POA: Insufficient documentation

## 2017-06-28 HISTORY — DX: Diverticulosis of large intestine without perforation or abscess without bleeding: K57.30

## 2017-06-28 HISTORY — PX: COLONOSCOPY WITH PROPOFOL: SHX5780

## 2017-06-28 HISTORY — PX: POLYPECTOMY: SHX5525

## 2017-06-28 HISTORY — DX: Personal history of colonic polyps: Z86.010

## 2017-06-28 HISTORY — DX: Personal history of colon polyps, unspecified: Z86.0100

## 2017-06-28 SURGERY — COLONOSCOPY WITH PROPOFOL
Anesthesia: Monitor Anesthesia Care

## 2017-06-28 MED ORDER — PROPOFOL 10 MG/ML IV BOLUS
INTRAVENOUS | Status: AC
  Filled 2017-06-28: qty 40

## 2017-06-28 MED ORDER — PROPOFOL 500 MG/50ML IV EMUL
INTRAVENOUS | Status: DC | PRN
  Administered 2017-06-28: 125 ug/kg/min via INTRAVENOUS

## 2017-06-28 MED ORDER — PHENYLEPHRINE HCL 10 MG/ML IJ SOLN
INTRAMUSCULAR | Status: DC | PRN
  Administered 2017-06-28: 80 ug via INTRAVENOUS

## 2017-06-28 MED ORDER — ONDANSETRON HCL 4 MG/2ML IJ SOLN
INTRAMUSCULAR | Status: DC | PRN
  Administered 2017-06-28: 4 mg via INTRAVENOUS

## 2017-06-28 MED ORDER — PROPOFOL 500 MG/50ML IV EMUL
INTRAVENOUS | Status: DC | PRN
  Administered 2017-06-28: 40 mg via INTRAVENOUS

## 2017-06-28 MED ORDER — LACTATED RINGERS IV SOLN
INTRAVENOUS | Status: DC | PRN
  Administered 2017-06-28: 11:00:00 via INTRAVENOUS

## 2017-06-28 SURGICAL SUPPLY — 21 items

## 2017-06-28 NOTE — Transfer of Care (Signed)
Immediate Anesthesia Transfer of Care Note  Patient: Jon Williams  Procedure(s) Performed: COLONOSCOPY WITH PROPOFOL (N/A ) POLYPECTOMY  Patient Location: PACU  Anesthesia Type:MAC  Level of Consciousness: awake, alert  and oriented  Airway & Oxygen Therapy: Patient Spontanous Breathing and Patient connected to face mask oxygen  Post-op Assessment: Report given to RN and Post -op Vital signs reviewed and stable  Post vital signs: Reviewed and stable  Last Vitals:  Vitals Value Taken Time  BP 97/66 06/28/2017 12:13 PM  Temp    Pulse 70 06/28/2017 12:15 PM  Resp 13 06/28/2017 12:15 PM  SpO2 100 % 06/28/2017 12:15 PM  Vitals shown include unvalidated device data.  Last Pain:  Vitals:   06/28/17 1213  TempSrc:   PainSc: 0-No pain         Complications: No apparent anesthesia complications

## 2017-06-28 NOTE — Discharge Instructions (Signed)

## 2017-06-28 NOTE — Telephone Encounter (Signed)
   [facility] Pre-operative Risk Assessment    Request for surgical clearance:  1. What type of surgery is being performed? RIGHT TOTAL KNEE REPLACEMENT   2. When is this surgery scheduled? TBD    3. Are there any medications that need to be held prior to surgery and how long? XARELTO    4. Practice name and name of physician performing surgery? [facility]; DR. Quillian Quince CAFFREY   5. What is your office phone and fax number? PH# [phone] EXT [address] [facility]); FAX # [phone] ATT: [id number]   3. Anesthesia type (None, local, MAC, general) ? SPINAL AND ADDUCTOR CANAL BLOCK   Julaine Hua 06/28/2017, 2:10 PM  _________________________________________________________________   (provider comments below)

## 2017-06-28 NOTE — Op Note (Addendum)
[facility] Patient Name: Jon Williams Procedure Date: 06/28/2017 MRN: [id number] Attending MD: [name] , MD Date of Birth: 04-21-1950 CSN: [id number] Age: 67 Admit Type: Outpatient Procedure:                Colonoscopy Indications:              High risk colon cancer surveillance: Personal                            history of colonic polyps Providers:                [name], MD, Zenon Mayo, RN, Cletis Athens,                            Technician Referring MD:              Medicines:                Propofol per Anesthesia Complications:            No immediate complications. Estimated Blood Loss:     Estimated blood loss: none. Procedure:                Pre-Anesthesia Assessment:                           - Prior to the procedure, a History and Physical                            was performed, and patient medications and                            allergies were reviewed. The patient's tolerance of                            previous anesthesia was also reviewed. The risks                            and benefits of the procedure and the sedation                            options and risks were discussed with the patient.                            All questions were answered, and informed consent                            was obtained. Prior Anticoagulants: The patient has                            taken no previous anticoagulant or antiplatelet                            agents. ASA Grade Assessment: III - A patient with                            severe systemic  disease. After reviewing the risks                            and benefits, the patient was deemed in                            satisfactory condition to undergo the procedure.                           - Sedation was administered by an anesthesia                            professional. Deep sedation was attained.                           After obtaining informed consent, the colonoscope                             was passed under direct vision. Throughout the                            procedure, the patient's blood pressure, pulse, and                            oxygen saturations were monitored continuously. The                            EC-[id number] ([id number]) scope was introduced through                            the anus and advanced to the the cecum, identified                            by appendiceal orifice and ileocecal valve. The                            colonoscopy was performed without difficulty. The                            patient tolerated the procedure well. The quality                            of the bowel preparation was excellent. The                            ileocecal valve, appendiceal orifice, and rectum                            were photographed. Scope In: 11:43:17 AM Scope Out: 12:00:23 PM Total Procedure Duration: 0 hours 17 minutes 6 seconds  Findings:      Two sessile polyps were found in the descending colon and ascending       colon. The polyps were 2 to 3 mm in size. These polyps were removed with       a  cold snare. Resection and retrieval were complete.      A few small-mouthed diverticula were found in the sigmoid colon. Impression:               - Two 2 to 3 mm polyps in the descending colon and                            in the ascending colon, removed with a cold snare.                            Resected and retrieved.                           - Diverticulosis in the sigmoid colon. Moderate Sedation:      N/A- Per Anesthesia Care Recommendation:           - Patient has a contact number available for                            emergencies. The signs and symptoms of potential                            delayed complications were discussed with the                            patient. Return to normal activities tomorrow.                            Written discharge instructions were provided to the                             patient.                           - Resume previous diet.                           - Continue present medications.                           - Await pathology results.                           - Repeat colonoscopy in 5-10 years for surveillance. Procedure Code(s):        --- Professional ---                           [phone], Colonoscopy, flexible; with removal of                            tumor(s), polyp(s), or other lesion(s) by snare                            technique Diagnosis Code(s):        --- Professional ---  D12.4, Benign neoplasm of descending colon                           Z86.010, Personal history of colonic polyps                           D12.2, Benign neoplasm of ascending colon                           K57.30, Diverticulosis of large intestine without                            perforation or abscess without bleeding CPT copyright 2017 [employer]. All rights reserved. The codes documented in this report are preliminary and upon coder review may  be revised to meet current compliance requirements. [name], MD [name], MD 06/28/2017 12:05:34 PM This report has been signed electronically. Number of Addenda: 0

## 2017-06-28 NOTE — H&P (Signed)
Jon Williams HPI: At this time the patient denies any problems with nausea, vomiting, fevers, chills, abdominal pain, diarrhea, constipation, hematochezia, melena, GERD, or dysphagia. The patient denies any known family history of colon cancers. No complaints of chest pain, SOB, MI, or sleep apnea. His colonoscopy on 03/25/2012 was significant for a small descending colon adenoma. He had a AICD/pacemaker placed 03/2015. Currently he is taking anticoagulation for his afib. Dr. Cristopher Peru is his cardiologist.   Past Medical History:  Diagnosis Date  . AICD (automatic cardioverter/defibrillator) present 05/25/2016   biv icd  . Arthritis    knee  . Chronic combined systolic and diastolic CHF (congestive heart failure) ([place])    Echo 1/18: Mild conc LVH, EF 15-20, severe diff HK, inf and inf-septal AK, Gr 3 DD, mild to mod MR, severe LAE, mod reduced RVSF, mod RAE, mild TR, PASP 50  . Coronary artery disease involving native coronary artery without angina pectoris 04/17/2016   LHC 1/18: pLCx 30, mLCx 20, mRCA 40, dRCA 20, LVEDP 23, mean RA 8, PA 42/20, PCWP 17  . LBBB (left bundle branch block)   . NICM (nonischemic cardiomyopathy) ([place])    Echo 1/18:  Mild conc LVH, EF 15-20, severe diff HK, inf and inf-septal AK, Gr 3 DD, mild to mod MR, severe LAE, mod reduced RVSF, mod RAE, mild TR, PASP 50  . Other secondary pulmonary hypertension ([place]) 04/17/2016  . Prostate cancer ([place]) 2019    Past Surgical History:  Procedure Laterality Date  . BIV ICD INSERTION CRT-D N/A 05/25/2016   Procedure: BiV ICD Insertion CRT-D;  Surgeon: [name], MD;  Location: [facility];  Service: Cardiovascular;  Laterality: N/A;  . CARDIAC CATHETERIZATION N/A 03/02/2016   Procedure: Right/Left Heart Cath and Coronary Angiography;  Surgeon: Nelva Bush, MD;  Location: [facility];  Service: Cardiovascular;  Laterality: N/A;  . CARDIOVERSION N/A 07/17/2016   Procedure: Cardioversion;  Surgeon: [name], MD;  Location: [facility];  Service: Cardiovascular;  Laterality: N/A;  . colonscopy  2009  . PROSTATE BIOPSY      Family History  Problem Relation Age of Onset  . Hypertension Mother   . Heart disease Mother   . Diabetes Mother   . Diabetes Father   . Hypertension Father   . Cancer Father   . Healthy Sister   . Heart attack Brother   . Heart disease Brother 8       + tobacco  . Healthy Brother     Social History:  reports that he has never smoked. He has never used smokeless tobacco. He reports that he does not drink alcohol or use drugs.  Allergies:  Allergies  Allergen Reactions  . Aspirin Anaphylaxis and Hives  . Sulfa Antibiotics Anaphylaxis and Hives    Medications: Scheduled: Continuous:  No results found for this or any previous visit (from the past 24 hour(s)).   Dg Knee 1-2 Views Right  Result Date: 06/27/2017 CLINICAL DATA:  Right knee pain for 1-2 days, no injury EXAM: RIGHT KNEE - 1-2 VIEW COMPARISON:  None. FINDINGS: There is significant degenerative joint disease of the right knee involving all 3 compartments. However, joint spaces are relatively well preserved medially and laterally although the patellofemoral articulation is diminished. Spurring and sclerosis is noted involving all compartments. No fracture is seen and there is no evidence of joint effusion. IMPRESSION: Significant tricompartmental degenerative joint disease of the right knee primarily involving the  patellofemoral compartment. Electronically Signed   By: Ivar Drape M.D.   On: 06/27/2017 16:31    ROS:  As stated above in the HPI otherwise negative.  There were no vitals taken for this visit.    PE: Gen: NAD, Alert and Oriented HEENT:  [place]/AT, EOMI Neck: Supple, no LAD Lungs: CTA Bilaterally CV: RRR without M/G/R ABM: Soft, NTND, +BS Ext: No C/C/E  Assessment/Plan: 1) Personal history of polyps  - colonoscopy.  Jon Williams D 06/28/2017, 11:03 AM

## 2017-06-28 NOTE — Anesthesia Postprocedure Evaluation (Signed)
Anesthesia Post Note  Patient: Jon Williams  Procedure(s) Performed: COLONOSCOPY WITH PROPOFOL (N/A ) POLYPECTOMY     Patient location during evaluation: PACU Anesthesia Type: MAC Level of consciousness: awake and alert Pain management: pain level controlled Vital Signs Assessment: post-procedure vital signs reviewed and stable Respiratory status: spontaneous breathing, nonlabored ventilation, respiratory function stable and patient connected to nasal cannula oxygen Cardiovascular status: stable and blood pressure returned to baseline Postop Assessment: no apparent nausea or vomiting Anesthetic complications: no    Last Vitals:  Vitals:   06/28/17 1220 06/28/17 1228  BP: 100/72 131/87  Pulse: 68 70  Resp: 18 19  Temp:    SpO2: 100% 100%    Last Pain:  Vitals:   06/28/17 1228  TempSrc:   PainSc: 0-No pain                 Tiajuana Amass

## 2017-06-28 NOTE — Anesthesia Preprocedure Evaluation (Addendum)
Anesthesia Evaluation  Patient identified by MRN, date of birth, ID band Patient awake    Reviewed: Allergy & Precautions, NPO status , Patient's Chart, lab work & pertinent test results  Airway Mallampati: II  TM Distance: >3 FB Neck ROM: Full    Dental  (+) Dental Advisory Given   Pulmonary neg pulmonary ROS,    breath sounds clear to auscultation       Cardiovascular hypertension, Pt. on home beta blockers and Pt. on medications + CAD (mild non-obstructive CAD by 2018 Cath.) and +CHF  + dysrhythmias + Cardiac Defibrillator  Rhythm:Regular Rate:Normal  EF 15-20% on 2018 Echo. ICD for non-ischemic cardiomyopathy.   Neuro/Psych negative neurological ROS     GI/Hepatic negative GI ROS, Neg liver ROS,   Endo/Other  negative endocrine ROS  Renal/GU negative Renal ROS     Musculoskeletal  (+) Arthritis ,   Abdominal   Peds  Hematology negative hematology ROS (+)   Anesthesia Other Findings   Reproductive/Obstetrics                             Anesthesia Physical Anesthesia Plan  ASA: IV  Anesthesia Plan: MAC   Post-op Pain Management:    Induction: Intravenous  PONV Risk Score and Plan: 1 and Ondansetron, Propofol infusion and Treatment may vary due to age or medical condition  Airway Management Planned: Natural Airway and Simple Face Mask  Additional Equipment:   Intra-op Plan:   Post-operative Plan:   Informed Consent: I have reviewed the patients History and Physical, chart, labs and discussed the procedure including the risks, benefits and alternatives for the proposed anesthesia with the patient or authorized representative who has indicated his/her understanding and acceptance.     Plan Discussed with: CRNA  Anesthesia Plan Comments:         Anesthesia Quick Evaluation

## 2017-07-01 ENCOUNTER — Encounter (HOSPITAL_COMMUNITY): Payer: Self-pay | Admitting: Gastroenterology

## 2017-07-01 NOTE — Telephone Encounter (Signed)
Will route to pharmacy for anticoagulation. Then will need phone call for clearance.

## 2017-07-02 NOTE — Telephone Encounter (Signed)
Patient with diagnosis of atrial fibrillation on Xarelto for anticoagulation.    Procedure: right total knee replacement Date of procedure: TBD  CHADS2-VASc score of  4 (CHF, HTN, AGE, , CAD, )  CrCl 82.3 Platelet count 179  Per office protocol, patient can hold Xarelto for 3 days prior to procedure.    Patient should restart Xarelto at discretion of procedure MD  For orthopedic procedures please be sure to resume therapeutic (not prophylactic) dosing.

## 2017-07-02 NOTE — Telephone Encounter (Signed)
Dr. Lovena Le, this pt is planned for knee replacement surgery. Can you weigh in on clearance. According to the RCRI he is a class II risk with 0.9% risk of major cardiac event, however,   This pt has an EF of 15% per echo in 04/2016. He is on optimal medical therapy and has an ICD which is showing 87% AT/AF burden. When I called him he reports feeling well with no S/S of volume overload. He can walk and climb stairs and go shopping without problems. His knee pain limits his activity.   We can follow along while he is hospitalized.   Please route response back to CV DIV PREOP

## 2017-07-02 NOTE — Telephone Encounter (Signed)
New Message:       Pt is calling to see what the hold up is for his clearance.

## 2017-07-04 ENCOUNTER — Encounter: Payer: Self-pay | Admitting: Medical Oncology

## 2017-07-04 NOTE — Telephone Encounter (Signed)
The patient is at least moderate risk for surgery. He has severe LV dysfunction. He will drop his pressure with induction of anesthesia. He does not have ischemic heart disease which is in his favor. GT

## 2017-07-05 NOTE — Telephone Encounter (Signed)
   Primary Cardiologist: Mertie Moores, MD  Chart reviewed as part of pre-operative protocol coverage. Given past medical history and time since last visit, based on ACC/AHA guidelines, Jon Williams would be at least MODERATE risk for the planned procedure without further cardiovascular testing.  PT has EF of 15%, per echo 04/2016, he is on optimal medical therapy per Dr. Lovena Le.  He does have an ICD.  Dr. Lovena Le feels he will drop his pressure with induction of anesthesia.  Pt does not have ischemic heart disease.  It is recommended he hold xarelto for 3 days prior to procedure.  Resume per Ortho.     I will route this recommendation to the requesting party via Epic fax function and remove from pre-op pool.  Please call with questions.  Cecilie Kicks, NP 07/05/2017, 3:53 PM

## 2017-07-05 NOTE — Telephone Encounter (Signed)
SPOKE WITH PT ABOUT XARELTO INSTRUCTIONS TO BE HELD 3 DAYS PRIOR TO SCHEDULED DATE THAT WILL BE SCHEDULED FOR HIM.

## 2017-07-12 ENCOUNTER — Encounter (HOSPITAL_BASED_OUTPATIENT_CLINIC_OR_DEPARTMENT_OTHER): Payer: Self-pay

## 2017-07-12 ENCOUNTER — Ambulatory Visit (HOSPITAL_BASED_OUTPATIENT_CLINIC_OR_DEPARTMENT_OTHER): Admit: 2017-07-12 | Payer: Medicare HMO | Admitting: Urology

## 2017-07-12 SURGERY — INSERTION, GOLD SEEDS
Anesthesia: General

## 2017-07-23 ENCOUNTER — Ambulatory Visit: Payer: Self-pay | Admitting: Physician Assistant

## 2017-07-23 DIAGNOSIS — M1711 Unilateral primary osteoarthritis, right knee: Secondary | ICD-10-CM | POA: Diagnosis not present

## 2017-07-23 NOTE — H&P (Signed)
TOTAL KNEE ADMISSION H&P  Patient is being admitted for right total knee arthroplasty.  Subjective:  Chief Complaint:right knee pain.  HPI: Jon Williams, 67 y.o. male, has a history of pain and functional disability in the right knee due to arthritis and has failed non-surgical conservative treatments for greater than 12 weeks to includeNSAID's and/or analgesics, corticosteriod injections, viscosupplementation injections and activity modification.  Onset of symptoms was gradual, starting >10 years ago with gradually worsening course since that time. The patient noted no past surgery on the right knee(s).  Patient currently rates pain in the right knee(s) at 10 out of 10 with activity. Patient has night pain, worsening of pain with activity and weight bearing, pain that interferes with activities of daily living, pain with passive range of motion, crepitus and joint swelling.  Patient has evidence of periarticular osteophytes, joint subluxation and joint space narrowing by imaging studies. There is no active infection.  Patient Active Problem List   Diagnosis Date Noted  . Malignant neoplasm of prostate ([place]) 04/02/2017  . ICD (implantable cardioverter-defibrillator) in place   . Persistent atrial fibrillation ([place]) 05/25/2016  . Atrial fibrillation ([place]) 05/25/2016  . Coronary artery disease involving native coronary artery without angina pectoris 04/17/2016  . Other secondary pulmonary hypertension ([place]) 04/17/2016  . Chronic combined systolic and diastolic CHF (congestive heart failure) ([place])   . NICM (nonischemic cardiomyopathy) ([place])   . LBBB (left bundle branch block)    Past Medical History:  Diagnosis Date  . AICD (automatic cardioverter/defibrillator) present 05/25/2016   biv icd  . Arthritis    knee  . Chronic combined systolic and diastolic CHF (congestive heart failure) ([place])    Echo 1/18: Mild conc LVH, EF 15-20, severe diff HK, inf and inf-septal AK, Gr 3 DD, mild to mod  MR, severe LAE, mod reduced RVSF, mod RAE, mild TR, PASP 50  . Coronary artery disease involving native coronary artery without angina pectoris 04/17/2016   LHC 1/18: pLCx 30, mLCx 20, mRCA 40, dRCA 20, LVEDP 23, mean RA 8, PA 42/20, PCWP 17  . LBBB (left bundle branch block)   . NICM (nonischemic cardiomyopathy) ([place])    Echo 1/18:  Mild conc LVH, EF 15-20, severe diff HK, inf and inf-septal AK, Gr 3 DD, mild to mod MR, severe LAE, mod reduced RVSF, mod RAE, mild TR, PASP 50  . Other secondary pulmonary hypertension ([place]) 04/17/2016  . Prostate cancer ([place]) 2019    Past Surgical History:  Procedure Laterality Date  . BIV ICD INSERTION CRT-D N/A 05/25/2016   Procedure: BiV ICD Insertion CRT-D;  Surgeon: [name], MD;  Location: [facility];  Service: Cardiovascular;  Laterality: N/A;  . CARDIAC CATHETERIZATION N/A 03/02/2016   Procedure: Right/Left Heart Cath and Coronary Angiography;  Surgeon: Nelva Bush, MD;  Location: [facility];  Service: Cardiovascular;  Laterality: N/A;  . CARDIOVERSION N/A 07/17/2016   Procedure: Cardioversion;  Surgeon: [name], MD;  Location: [facility];  Service: Cardiovascular;  Laterality: N/A;  . COLONOSCOPY WITH PROPOFOL N/A 06/28/2017   Procedure: COLONOSCOPY WITH PROPOFOL;  Surgeon: [name], MD;  Location: [facility];  Service: Endoscopy;  Laterality: N/A;  . colonscopy  2009  . POLYPECTOMY  06/28/2017   Procedure: POLYPECTOMY;  Surgeon: [name], MD;  Location: [facility];  Service: Endoscopy;;  ascending and descending colon polyp  . PROSTATE BIOPSY      Current Outpatient Medications  Medication Sig Dispense Refill Last  Dose  . atorvastatin (LIPITOR) 40 MG tablet Take 1 tablet (40 mg total) by mouth daily. 90 tablet 3 06/28/2017 at Unknown time  . carvedilol (COREG) 3.125 MG tablet Take 1 tablet (3.125 mg total) by mouth 2 (two) times daily. 180 tablet 3 06/28/2017 at Unknown time  . cholecalciferol (VITAMIN  D) 1000 units tablet Take 1,000 Units by mouth daily.   Past Week at Unknown time  . furosemide (LASIX) 40 MG tablet TAKE 1 TABLET BY MOUTH TWICE A DAY 180 tablet 3 06/27/2017 at Unknown time  . Multiple Vitamin (ONE-A-DAY MENS PO) Take 1 tablet by mouth daily.    Past Week at Unknown time  . Omega-3 Fatty Acids (OMEGA 3 PO) Take 1 tablet by mouth 2 (two) times daily.    Past Week at Unknown time  . oxyCODONE-acetaminophen (PERCOCET) 7.5-325 MG tablet Take 1 tablet by mouth every 8 (eight) hours as needed for severe pain.   06/27/2017 at Unknown time  . potassium chloride (K-DUR) 10 MEQ tablet Take 10 mEq by mouth 2 (two) times daily.   0 06/27/2017 at Unknown time  . rivaroxaban (XARELTO) 20 MG TABS tablet Take 1 tablet (20 mg total) by mouth daily with supper. 90 tablet 3 Past Week at Unknown time  . sacubitril-valsartan (ENTRESTO) 24-26 MG Take 1 tablet by mouth 2 (two) times daily. (Patient taking differently: Take 1 tablet by mouth daily. ) 180 tablet 3 06/27/2017 at Unknown time  . sotalol (BETAPACE) 80 MG tablet Take 1.5 tablets (120 mg total) by mouth 2 (two) times daily. Please keep upcoming appt in March for future refills. Thank you 270 tablet 1 06/28/2017 at Unknown time   No current facility-administered medications for this visit.    Allergies  Allergen Reactions  . Aspirin Anaphylaxis and Hives  . Sulfa Antibiotics Anaphylaxis and Hives    Social History   Tobacco Use  . Smoking status: Never Smoker  . Smokeless tobacco: Never Used  Substance Use Topics  . Alcohol use: No    Family History  Problem Relation Age of Onset  . Hypertension Mother   . Heart disease Mother   . Diabetes Mother   . Diabetes Father   . Hypertension Father   . Cancer Father   . Healthy Sister   . Heart attack Brother   . Heart disease Brother 67       + tobacco  . Healthy Brother      Review of Systems  Gastrointestinal: Positive for diarrhea.  Musculoskeletal: Positive for joint pain.  All  other systems reviewed and are negative.   Objective:  Physical Exam  Constitutional: He is oriented to person, place, and time. He appears well-developed and well-nourished. No distress.  HENT:  Head: Normocephalic and atraumatic.  Nose: Nose normal.  Eyes: Pupils are equal, round, and reactive to light. Conjunctivae and EOM are normal.  Neck: Normal range of motion. Neck supple.  Cardiovascular: Normal rate, regular rhythm, normal heart sounds and intact distal pulses.  Respiratory: Effort normal and breath sounds normal. No respiratory distress. He has no wheezes.  GI: Soft. Bowel sounds are normal. He exhibits no distension. There is no tenderness.  Musculoskeletal:       Right knee: He exhibits decreased range of motion, swelling and deformity. He exhibits no erythema. Tenderness found. Medial joint line and lateral joint line tenderness noted.  Lymphadenopathy:    He has no cervical adenopathy.  Neurological: He is alert and oriented to person, place, and  time.  Skin: Skin is warm and dry. No rash noted. No erythema.  Psychiatric: He has a normal mood and affect. His behavior is normal.    Vital signs in last 24 hours: @VSRANGES @  Labs:   Estimated body mass index is 34.54 kg/m as calculated from the following:   Height as of 06/28/17: 5\' 6"  (1.676 m).   Weight as of 06/28/17: 97.1 kg (214 lb).   Imaging Review Plain radiographs demonstrate severe degenerative joint disease of the right knee(s). The overall alignment issignificant varus. The bone quality appears to be good for age and reported activity level.   Preoperative templating of the joint replacement has been completed, documented, and submitted to the Operating Room personnel in order to optimize intra-operative equipment management.   Anticipated LOS equal to or greater than 2 midnights due to - Age 75 and older with one or more of the following:  - Obesity  - Expected need for hospital services (PT, OT,  Nursing) required for safe  discharge  - Anticipated need for postoperative skilled nursing care or inpatient rehab  - Active co-morbidities: Chronic pain requiring opiods, Coronary Artery Disease and Cardiac Arrhythmia OR   - Unanticipated findings during/Post Surgery: None  - Patient is a high risk of re-admission due to: None     Assessment/Plan:  End stage arthritis, right knee   The patient history, physical examination, clinical judgment of the provider and imaging studies are consistent with end stage degenerative joint disease of the right knee(s) and total knee arthroplasty is deemed medically necessary. The treatment options including medical management, injection therapy arthroscopy and arthroplasty were discussed at length. The risks and benefits of total knee arthroplasty were presented and reviewed. The risks due to aseptic loosening, infection, stiffness, patella tracking problems, thromboembolic complications and other imponderables were discussed. The patient acknowledged the explanation, agreed to proceed with the plan and consent was signed. Patient is being admitted for inpatient treatment for surgery, pain control, PT, OT, prophylactic antibiotics, VTE prophylaxis, progressive ambulation and ADL's and discharge planning. The patient is planning to be discharged home with home health services

## 2017-07-23 NOTE — H&P (View-Only) (Signed)
TOTAL KNEE ADMISSION H&P  Patient is being admitted for right total knee arthroplasty.  Subjective:  Chief Complaint:right knee pain.  HPI: Jon Williams, 67 y.o. male, has a history of pain and functional disability in the right knee due to arthritis and has failed non-surgical conservative treatments for greater than 12 weeks to includeNSAID's and/or analgesics, corticosteriod injections, viscosupplementation injections and activity modification.  Onset of symptoms was gradual, starting >10 years ago with gradually worsening course since that time. The patient noted no past surgery on the right knee(s).  Patient currently rates pain in the right knee(s) at 10 out of 10 with activity. Patient has night pain, worsening of pain with activity and weight bearing, pain that interferes with activities of daily living, pain with passive range of motion, crepitus and joint swelling.  Patient has evidence of periarticular osteophytes, joint subluxation and joint space narrowing by imaging studies. There is no active infection.  Patient Active Problem List   Diagnosis Date Noted  . Malignant neoplasm of prostate ([place]) 04/02/2017  . ICD (implantable cardioverter-defibrillator) in place   . Persistent atrial fibrillation ([place]) 05/25/2016  . Atrial fibrillation ([place]) 05/25/2016  . Coronary artery disease involving native coronary artery without angina pectoris 04/17/2016  . Other secondary pulmonary hypertension ([place]) 04/17/2016  . Chronic combined systolic and diastolic CHF (congestive heart failure) ([place])   . NICM (nonischemic cardiomyopathy) ([place])   . LBBB (left bundle branch block)    Past Medical History:  Diagnosis Date  . AICD (automatic cardioverter/defibrillator) present 05/25/2016   biv icd  . Arthritis    knee  . Chronic combined systolic and diastolic CHF (congestive heart failure) ([place])    Echo 1/18: Mild conc LVH, EF 15-20, severe diff HK, inf and inf-septal AK, Gr 3 DD, mild to mod  MR, severe LAE, mod reduced RVSF, mod RAE, mild TR, PASP 50  . Coronary artery disease involving native coronary artery without angina pectoris 04/17/2016   LHC 1/18: pLCx 30, mLCx 20, mRCA 40, dRCA 20, LVEDP 23, mean RA 8, PA 42/20, PCWP 17  . LBBB (left bundle branch block)   . NICM (nonischemic cardiomyopathy) ([place])    Echo 1/18:  Mild conc LVH, EF 15-20, severe diff HK, inf and inf-septal AK, Gr 3 DD, mild to mod MR, severe LAE, mod reduced RVSF, mod RAE, mild TR, PASP 50  . Other secondary pulmonary hypertension ([place]) 04/17/2016  . Prostate cancer ([place]) 2019    Past Surgical History:  Procedure Laterality Date  . BIV ICD INSERTION CRT-D N/A 05/25/2016   Procedure: BiV ICD Insertion CRT-D;  Surgeon: [name], MD;  Location: [facility];  Service: Cardiovascular;  Laterality: N/A;  . CARDIAC CATHETERIZATION N/A 03/02/2016   Procedure: Right/Left Heart Cath and Coronary Angiography;  Surgeon: Nelva Bush, MD;  Location: [facility];  Service: Cardiovascular;  Laterality: N/A;  . CARDIOVERSION N/A 07/17/2016   Procedure: Cardioversion;  Surgeon: [name], MD;  Location: [facility];  Service: Cardiovascular;  Laterality: N/A;  . COLONOSCOPY WITH PROPOFOL N/A 06/28/2017   Procedure: COLONOSCOPY WITH PROPOFOL;  Surgeon: [name], MD;  [facility];  Service: Endoscopy;  Laterality: N/A;  . colonscopy  2009  . POLYPECTOMY  06/28/2017   Procedure: POLYPECTOMY;  Surgeon: [name], MD;  [facility];  Service: Endoscopy;;  ascending and descending colon polyp  . PROSTATE BIOPSY      Current Outpatient Medications  Medication Sig Dispense Refill Last  Dose  . atorvastatin (LIPITOR) 40 MG tablet Take 1 tablet (40 mg total) by mouth daily. 90 tablet 3 06/28/2017 at Unknown time  . carvedilol (COREG) 3.125 MG tablet Take 1 tablet (3.125 mg total) by mouth 2 (two) times daily. 180 tablet 3 06/28/2017 at Unknown time  . cholecalciferol (VITAMIN  D) 1000 units tablet Take 1,000 Units by mouth daily.   Past Week at Unknown time  . furosemide (LASIX) 40 MG tablet TAKE 1 TABLET BY MOUTH TWICE A DAY 180 tablet 3 06/27/2017 at Unknown time  . Multiple Vitamin (ONE-A-DAY MENS PO) Take 1 tablet by mouth daily.    Past Week at Unknown time  . Omega-3 Fatty Acids (OMEGA 3 PO) Take 1 tablet by mouth 2 (two) times daily.    Past Week at Unknown time  . oxyCODONE-acetaminophen (PERCOCET) 7.5-325 MG tablet Take 1 tablet by mouth every 8 (eight) hours as needed for severe pain.   06/27/2017 at Unknown time  . potassium chloride (K-DUR) 10 MEQ tablet Take 10 mEq by mouth 2 (two) times daily.   0 06/27/2017 at Unknown time  . rivaroxaban (XARELTO) 20 MG TABS tablet Take 1 tablet (20 mg total) by mouth daily with supper. 90 tablet 3 Past Week at Unknown time  . sacubitril-valsartan (ENTRESTO) 24-26 MG Take 1 tablet by mouth 2 (two) times daily. (Patient taking differently: Take 1 tablet by mouth daily. ) 180 tablet 3 06/27/2017 at Unknown time  . sotalol (BETAPACE) 80 MG tablet Take 1.5 tablets (120 mg total) by mouth 2 (two) times daily. Please keep upcoming appt in March for future refills. Thank you 270 tablet 1 06/28/2017 at Unknown time   No current facility-administered medications for this visit.    Allergies  Allergen Reactions  . Aspirin Anaphylaxis and Hives  . Sulfa Antibiotics Anaphylaxis and Hives    Social History   Tobacco Use  . Smoking status: Never Smoker  . Smokeless tobacco: Never Used  Substance Use Topics  . Alcohol use: No    Family History  Problem Relation Age of Onset  . Hypertension Mother   . Heart disease Mother   . Diabetes Mother   . Diabetes Father   . Hypertension Father   . Cancer Father   . Healthy Sister   . Heart attack Brother   . Heart disease Brother 24       + tobacco  . Healthy Brother      Review of Systems  Gastrointestinal: Positive for diarrhea.  Musculoskeletal: Positive for joint pain.  All  other systems reviewed and are negative.   Objective:  Physical Exam  Constitutional: He is oriented to person, place, and time. He appears well-developed and well-nourished. No distress.  HENT:  Head: Normocephalic and atraumatic.  Nose: Nose normal.  Eyes: Pupils are equal, round, and reactive to light. Conjunctivae and EOM are normal.  Neck: Normal range of motion. Neck supple.  Cardiovascular: Normal rate, regular rhythm, normal heart sounds and intact distal pulses.  Respiratory: Effort normal and breath sounds normal. No respiratory distress. He has no wheezes.  GI: Soft. Bowel sounds are normal. He exhibits no distension. There is no tenderness.  Musculoskeletal:       Right knee: He exhibits decreased range of motion, swelling and deformity. He exhibits no erythema. Tenderness found. Medial joint line and lateral joint line tenderness noted.  Lymphadenopathy:    He has no cervical adenopathy.  Neurological: He is alert and oriented to person, place, and  time.  Skin: Skin is warm and dry. No rash noted. No erythema.  Psychiatric: He has a normal mood and affect. His behavior is normal.    Vital signs in last 24 hours: @VSRANGES @  Labs:   Estimated body mass index is 34.54 kg/m as calculated from the following:   Height as of 06/28/17: 5\' 6"  (1.676 m).   Weight as of 06/28/17: 97.1 kg (214 lb).   Imaging Review Plain radiographs demonstrate severe degenerative joint disease of the right knee(s). The overall alignment issignificant varus. The bone quality appears to be good for age and reported activity level.   Preoperative templating of the joint replacement has been completed, documented, and submitted to the Operating Room personnel in order to optimize intra-operative equipment management.   Anticipated LOS equal to or greater than 2 midnights due to - Age 82 and older with one or more of the following:  - Obesity  - Expected need for hospital services (PT, OT,  Nursing) required for safe  discharge  - Anticipated need for postoperative skilled nursing care or inpatient rehab  - Active co-morbidities: Chronic pain requiring opiods, Coronary Artery Disease and Cardiac Arrhythmia OR   - Unanticipated findings during/Post Surgery: None  - Patient is a high risk of re-admission due to: None     Assessment/Plan:  End stage arthritis, right knee   The patient history, physical examination, clinical judgment of the provider and imaging studies are consistent with end stage degenerative joint disease of the right knee(s) and total knee arthroplasty is deemed medically necessary. The treatment options including medical management, injection therapy arthroscopy and arthroplasty were discussed at length. The risks and benefits of total knee arthroplasty were presented and reviewed. The risks due to aseptic loosening, infection, stiffness, patella tracking problems, thromboembolic complications and other imponderables were discussed. The patient acknowledged the explanation, agreed to proceed with the plan and consent was signed. Patient is being admitted for inpatient treatment for surgery, pain control, PT, OT, prophylactic antibiotics, VTE prophylaxis, progressive ambulation and ADL's and discharge planning. The patient is planning to be discharged home with home health services

## 2017-07-24 ENCOUNTER — Ambulatory Visit: Payer: Medicare HMO | Admitting: Podiatry

## 2017-07-26 DIAGNOSIS — H524 Presbyopia: Secondary | ICD-10-CM | POA: Diagnosis not present

## 2017-07-26 DIAGNOSIS — H5213 Myopia, bilateral: Secondary | ICD-10-CM | POA: Diagnosis not present

## 2017-07-26 DIAGNOSIS — H52223 Regular astigmatism, bilateral: Secondary | ICD-10-CM | POA: Diagnosis not present

## 2017-08-01 ENCOUNTER — Telehealth: Payer: Self-pay | Admitting: Medical Oncology

## 2017-08-01 ENCOUNTER — Encounter: Payer: Self-pay | Admitting: Medical Oncology

## 2017-08-01 NOTE — Telephone Encounter (Signed)
Spoke with patient to discuss appointment for fiducial markers and [employer]. He was scheduled 3/26 and 5/31 but patient cancelled. He states he is scheduled for knee replacement 7/5 and would like to reschedule fiducial markers/[employer] in late July. I stressed the importance of getting his prostate cancer treated. He stated that his knee started giving him terrible pain and needed to get surgery. He did receive ADT 05/07/17. [employer].

## 2017-08-02 NOTE — Pre-Procedure Instructions (Signed)
Jon Williams  08/02/2017      [facility], [place] - [place] [address] Phone: [phone] Fax: [phone]  [id number] - [place], [place] - [address] [place] [address] Phone: [phone] Fax: [phone]    Your procedure is scheduled on July 5th.  Report to [facility] Admitting at [facility].M.  Call this number if you have problems the morning of surgery:  [phone]   Remember:  Do not eat or drink after midnight.  Continue all medications as directed by your physician except follow these medication instructions before surgery below     Take these medicines the morning of surgery with A SIP OF WATER  carvedilol (COREG) 3.125 MG tablet sotalol (BETAPACE) 80 MG tablet oxyCODONE-acetaminophen (PERCOCET) 7.5-325 MG tablet-as needed for pain. sotalol (BETAPACE) 80 MG tablet  Follow your doctors instructions regarding your XARELTO.  If no instructions were given by your doctor, then you will need to call the prescribing office office to get instructions.    7 days prior to surgery STOP taking any Aspirin(unless otherwise instructed by your surgeon), Aleve, Naproxen, Ibuprofen, Motrin, Advil, Goody's, BC's, all herbal medications, fish oil, and all vitamins     Do not wear jewelry.  Do not wear lotions, powders, or colognes, or deodorant.  Men may shave face and neck.  Do not bring valuables to the hospital.  [facility] is not responsible for any belongings or valuables.  Eyeglasses, contacts, hearing aids, dentures or bridgework may not be worn into surgery.  Leave your suitcase in the car.  After surgery it may be brought to your room.  For patients admitted to the hospital, discharge time will be determined by your treatment team.  Patients discharged the day of  surgery will not be allowed to drive home.    [facility]- Preparing For Surgery  Before surgery, you can play an important role. Because skin is not sterile, your skin needs to be as free of germs as possible. You can reduce the number of germs on your skin by washing with CHG (chlorahexidine gluconate) Soap before surgery.  CHG is an antiseptic cleaner which kills germs and bonds with the skin to continue killing germs even after washing.    Oral Hygiene is also important to reduce your risk of infection.  Remember - BRUSH YOUR TEETH THE MORNING OF SURGERY WITH YOUR REGULAR TOOTHPASTE  Please do not use if you have an allergy to CHG or antibacterial soaps. If your skin becomes reddened/irritated stop using the CHG.  Do not shave (including legs and underarms) for at least 48 hours prior to first CHG shower. It is OK to shave your face.  Please follow these instructions carefully.   1. Shower the NIGHT BEFORE SURGERY and the MORNING OF SURGERY with CHG.   2. If you chose to wash your hair, wash your hair first as usual with your normal shampoo.  3. After you shampoo, rinse your hair and body thoroughly to remove the shampoo.  4. Use CHG as you would any other liquid soap. You can apply CHG directly to the skin and wash gently with a scrungie or a clean washcloth.   5. Apply the CHG Soap to your body ONLY FROM THE NECK DOWN.  Do not use on open wounds or open sores. Avoid contact  with your eyes, ears, mouth and genitals (private parts). Wash Face and genitals (private parts)  with your normal soap.  6. Wash thoroughly, paying special attention to the area where your surgery will be performed.  7. Thoroughly rinse your body with warm water from the neck down.  8. DO NOT shower/wash with your normal soap after using and rinsing off the CHG Soap.  9. Pat yourself dry with a CLEAN TOWEL.  10. Wear CLEAN PAJAMAS to bed the night before surgery, wear comfortable clothes the morning of  surgery  11. Place CLEAN SHEETS on your bed the night of your first shower and DO NOT SLEEP WITH PETS.    Day of Surgery:  Do not apply any deodorants/lotions.  Please wear clean clothes to the hospital/surgery center.   Remember to brush your teeth WITH YOUR REGULAR TOOTHPASTE.    Please read over the following fact sheets that you were given. Pain Booklet, Coughing and Deep Breathing, MRSA Information and Surgical Site Infection Prevention

## 2017-08-05 ENCOUNTER — Other Ambulatory Visit (HOSPITAL_COMMUNITY): Payer: Medicare HMO

## 2017-08-05 ENCOUNTER — Encounter (HOSPITAL_COMMUNITY): Payer: Self-pay

## 2017-08-05 ENCOUNTER — Other Ambulatory Visit: Payer: Self-pay

## 2017-08-05 ENCOUNTER — Encounter (HOSPITAL_COMMUNITY)
Admission: RE | Admit: 2017-08-05 | Discharge: 2017-08-05 | Disposition: A | Discharge status: Home / Self Care | Payer: Medicare HMO | Source: Ambulatory Visit | Attending: Orthopedic Surgery | Admitting: Orthopedic Surgery

## 2017-08-05 ENCOUNTER — Ambulatory Visit (HOSPITAL_COMMUNITY)
Admission: RE | Admit: 2017-08-05 | Discharge: 2017-08-05 | Disposition: A | Discharge status: Home / Self Care | Payer: Medicare HMO | Source: Ambulatory Visit | Attending: Physician Assistant | Admitting: Physician Assistant

## 2017-08-05 DIAGNOSIS — M1711 Unilateral primary osteoarthritis, right knee: Secondary | ICD-10-CM | POA: Diagnosis not present

## 2017-08-05 DIAGNOSIS — Z01818 Encounter for other preprocedural examination: Secondary | ICD-10-CM | POA: Diagnosis not present

## 2017-08-05 DIAGNOSIS — Z01812 Encounter for preprocedural laboratory examination: Secondary | ICD-10-CM | POA: Insufficient documentation

## 2017-08-05 LAB — CBC WITH DIFFERENTIAL/PLATELET
Abs Immature Granulocytes: 0 10*3/uL (ref 0.0–0.1)
Basophils Absolute: 0 10*3/uL (ref 0.0–0.1)
Basophils Relative: 0 %
Eosinophils Absolute: 0.1 10*3/uL (ref 0.0–0.7)
Eosinophils Relative: 1 %
HCT: 43.5 % (ref 39.0–52.0)
Hemoglobin: 14.3 g/dL (ref 13.0–17.0)
Immature Granulocytes: 0 %
Lymphocytes Relative: 22 %
Lymphs Abs: 1.6 10*3/uL (ref 0.7–4.0)
MCH: 31.2 pg (ref 26.0–34.0)
MCHC: 32.9 g/dL (ref 30.0–36.0)
MCV: 95 fL (ref 78.0–100.0)
Monocytes Absolute: 0.6 10*3/uL (ref 0.1–1.0)
Monocytes Relative: 9 %
Neutro Abs: 5.1 10*3/uL (ref 1.7–7.7)
Neutrophils Relative %: 68 %
Platelets: 174 10*3/uL (ref 150–400)
RBC: 4.58 MIL/uL (ref 4.22–5.81)
RDW: 12.6 % (ref 11.5–15.5)
WBC: 7.5 10*3/uL (ref 4.0–10.5)

## 2017-08-05 LAB — COMPREHENSIVE METABOLIC PANEL
ALT: 74 U/L — ABNORMAL HIGH (ref 17–63)
AST: 46 U/L — ABNORMAL HIGH (ref 15–41)
Albumin: 4.5 g/dL (ref 3.5–5.0)
Alkaline Phosphatase: 56 U/L (ref 38–126)
Anion gap: 12 (ref 5–15)
BUN: 22 mg/dL — ABNORMAL HIGH (ref 6–20)
CO2: 25 mmol/L (ref 22–32)
Calcium: 9.8 mg/dL (ref 8.9–10.3)
Chloride: 101 mmol/L (ref 101–111)
Creatinine, Ser: 1.31 mg/dL — ABNORMAL HIGH (ref 0.61–1.24)
GFR calc Af Amer: >60 mL/min (ref >60)
GFR calc non Af Amer: 54 mL/min — ABNORMAL LOW (ref >60)
Glucose, Bld: 130 mg/dL — ABNORMAL HIGH (ref 65–99)
Potassium: 3.9 mmol/L (ref 3.5–5.1)
Sodium: 138 mmol/L (ref 135–145)
Total Bilirubin: 0.9 mg/dL (ref 0.3–1.2)
Total Protein: 7.5 g/dL (ref 6.5–8.1)

## 2017-08-05 LAB — TYPE AND SCREEN
ABO/RH(D): O POS
Antibody Screen: NEGATIVE

## 2017-08-05 LAB — URINALYSIS, ROUTINE W REFLEX MICROSCOPIC
Bilirubin Urine: NEGATIVE
Glucose, UA: NEGATIVE mg/dL
Hgb urine dipstick: NEGATIVE
Ketones, ur: NEGATIVE mg/dL
Leukocytes, UA: NEGATIVE
Nitrite: NEGATIVE
Protein, ur: NEGATIVE mg/dL
Specific Gravity, Urine: 1.02 (ref 1.005–1.030)
pH: 5 (ref 5.0–8.0)

## 2017-08-05 LAB — APTT: aPTT: 40 seconds — ABNORMAL HIGH (ref 24–36)

## 2017-08-05 LAB — ABO/RH: ABO/RH(D): O POS

## 2017-08-05 LAB — SURGICAL PCR SCREEN
MRSA, PCR: NEGATIVE
Staphylococcus aureus: NEGATIVE

## 2017-08-05 LAB — PROTIME-INR
INR: 1.49
Prothrombin Time: 17.9 seconds — ABNORMAL HIGH (ref 11.4–15.2)

## 2017-08-05 NOTE — Progress Notes (Signed)
PCP - Dr. [name] Cardiologist/EP: Dr. Crissie Sickles  Chest x-ray - 08/05/2017 EKG - 04/12/2017 Stress Test - 2018 ECHO - 05/02/2016 Cardiac Cath - 03/02/2016  Sleep Study - patient denies  Blood Thinner Instructions: Xarelto  Instructions: Instructed by Baldo Ash, PA  Of Dr. Lovena Le to hold Xarelto for 3 days prior to surgery. Patient will take last dose on July 1st.   Anesthesia review: Yes  Patient has a Public librarian. Aaron Edelman, rep from [employer] was notified of patients surgery and surgery date. ICD form faxed to Dr. Tanna Furry office.   Patient denies shortness of breath, fever, cough and chest pain at PAT appointment   Patient verbalized understanding of instructions that were given to them at the PAT appointment. Patient was also instructed that they will need to review over the PAT instructions again at home before surgery.

## 2017-08-06 LAB — URINE CULTURE: Culture: NO GROWTH

## 2017-08-06 NOTE — Progress Notes (Signed)
Anesthesia Chart Review:   Case:  [id number] Date/Time:  08/16/17 0715   Procedure:  TOTAL KNEE ARTHROPLASTY (Right )   Anesthesia type:  Choice   Pre-op diagnosis:  OA RIGHT KNEE   Location:  [facility] OR ROOM 07 / [place] [facility]   Surgeon:  [name], MD      DISCUSSION: - Pt is a 67 year old male with hx CAD (nonobstructive by 03/02/16 cath), nonischemic cardiomyopathy (EF 15%), chronic combined systolic and diastolic CHF, AICD ([employer] Jude; implanted 05/25/16), secondary pulmonary HTN, LBBB.  - Pt to hold Xarelto 3 days before surgery  - Pt has cardiac clearance for surgery at moderate risk.  Per Dr. Lovena Le, "he has severe LV dysfunction. He will drop his pressure with induction of anesthesia. He does not have ischemic heart disease which is in his favor"  - PT and PTT elevated.  Will recheck day of surgery  - Reviewed case with Dr. Conrad Newman.   VS: BP 127/77   Pulse 88   Temp 36.6 C   Resp 20   Ht 5\' 6"  (1.676 m)   Wt 213 lb 8 oz (96.8 kg)   SpO2 97%   BMI 34.46 kg/m    PROVIDERS: PCP is [name], MD Cardiologist is Cristopher Peru, MD. Last office visit 04/12/17.  Dr. Lovena Le cleared pt for surgery at moderate risk on 07/04/17   LABS:  - PT 17.9, PTT 40.  Will recheck day of surgery  (all labs ordered are listed, but only abnormal results are displayed)  Labs Reviewed  APTT - Abnormal; Notable for the following components:      Result Value   aPTT 40 (*)    All other components within normal limits  COMPREHENSIVE METABOLIC PANEL - Abnormal; Notable for the following components:   Glucose, Bld 130 (*)    BUN 22 (*)    Creatinine, Ser 1.31 (*)    AST 46 (*)    ALT 74 (*)    GFR calc non Af Amer 54 (*)    All other components within normal limits  PROTIME-INR - Abnormal; Notable for the following components:   Prothrombin Time 17.9 (*)    All other components within normal limits  URINALYSIS, ROUTINE W REFLEX MICROSCOPIC - Abnormal; Notable for the following components:   APPearance HAZY (*)    All other components within normal limits  URINE CULTURE  SURGICAL PCR SCREEN  CBC WITH DIFFERENTIAL/PLATELET  TYPE AND SCREEN  ABO/RH     IMAGES:  CXR 08/05/17: No active cardiopulmonary disease.   EKG 04/12/17: Ventricular-paced rhythm with occasional supraventricular complexes.  BiV pacemaker detected.   CV:  Echo 05/02/16:  - Procedure narrative: Limited study. Transthoracic echocardiography for left ventricular function evaluation. Image quality was adequate. - Left ventricle: The cavity size was moderately dilated. Wall thickness was increased in a pattern of mild LVH. Systolic function was severely reduced. The estimated ejection fraction was 15%. LV apical false tendon - no mural thrombus noted. Diffuse hypokinesis. - Impressions: Compared to a prior study in 02/2016, the LVEF remains at 15-20%.   R/L cardiac cath 03/02/16:  - Conclusions: 1. Mild to moderate, non-obstructive coronary artery disease consistent with non-ischemic cardiomyopathy.  (Proximal CX 30%, mid CX 20%; mid RCA 40%, distal RCA 20%) 2. Upper normal to mildly elevated left and right heart filling pressures. 3. Mild pulmonary hypertension. 4. Reduced Fick cardiac output/index. - Recommendations: 1. Aggressive medical therapy for non-ischemic cardiomyopathy. 2. Medical therapy to prevent progression of  non-obstructive CAD.   Past Medical History:  Diagnosis Date  . AICD (automatic cardioverter/defibrillator) present 05/25/2016   biv icd  . Arthritis    knee  . Chronic combined systolic and diastolic CHF (congestive heart failure) ([place])    Echo 1/18: Mild conc LVH, EF 15-20, severe diff HK, inf and inf-septal AK, Gr 3 DD, mild to mod MR, severe LAE, mod reduced RVSF, mod RAE, mild TR, PASP 50  . Coronary artery disease involving native coronary artery without angina pectoris 04/17/2016   LHC 1/18: pLCx 30, mLCx 20, mRCA 40, dRCA 20, LVEDP 23, mean RA 8, PA 42/20, PCWP 17  . LBBB  (left bundle branch block)   . NICM (nonischemic cardiomyopathy) ([place])    Echo 1/18:  Mild conc LVH, EF 15-20, severe diff HK, inf and inf-septal AK, Gr 3 DD, mild to mod MR, severe LAE, mod reduced RVSF, mod RAE, mild TR, PASP 50  . Other secondary pulmonary hypertension ([place]) 04/17/2016  . Prostate cancer ([place]) 2019    Past Surgical History:  Procedure Laterality Date  . BIV ICD INSERTION CRT-D N/A 05/25/2016   Procedure: BiV ICD Insertion CRT-D;  Surgeon: [name], MD;  Location: [facility];  Service: Cardiovascular;  Laterality: N/A;  . CARDIAC CATHETERIZATION N/A 03/02/2016   Procedure: Right/Left Heart Cath and Coronary Angiography;  Surgeon: Nelva Bush, MD;  Location: [facility];  Service: Cardiovascular;  Laterality: N/A;  . CARDIOVERSION N/A 07/17/2016   Procedure: Cardioversion;  Surgeon: [name], MD;  Location: [facility];  Service: Cardiovascular;  Laterality: N/A;  . COLONOSCOPY WITH PROPOFOL N/A 06/28/2017   Procedure: COLONOSCOPY WITH PROPOFOL;  Surgeon: [name], MD;  [facility];  Service: Endoscopy;  Laterality: N/A;  . colonscopy  2009  . INSERT / REPLACE / REMOVE PACEMAKER    . POLYPECTOMY  06/28/2017   Procedure: POLYPECTOMY;  Surgeon: [name], MD;  [facility];  Service: Endoscopy;;  ascending and descending colon polyp  . PROSTATE BIOPSY      MEDICATIONS: . atorvastatin (LIPITOR) 40 MG tablet  . carvedilol (COREG) 3.125 MG tablet  . cholecalciferol (VITAMIN D) 1000 units tablet  . furosemide (LASIX) 40 MG tablet  . Multiple Vitamin (MULTIVITAMIN WITH MINERALS) TABS tablet  . oxyCODONE-acetaminophen (PERCOCET) 7.5-325 MG tablet  . potassium chloride (K-DUR) 10 MEQ tablet  . rivaroxaban (XARELTO) 20 MG TABS tablet  . sacubitril-valsartan (ENTRESTO) 24-26 MG  . sotalol (BETAPACE) 80 MG tablet   No current facility-administered medications for this encounter.    - Pt to hold Xarelto 3 days  before surgery   If labs acceptable day of surgery, I anticipate pt can proceed with surgery as scheduled.    Willeen Cass, FNP-BC [facility] Short Stay Surgical Center/Anesthesiology Phone: [phone] 08/06/2017 2:05 PM

## 2017-08-07 ENCOUNTER — Encounter: Payer: Medicare HMO | Admitting: *Deleted

## 2017-08-07 ENCOUNTER — Telehealth: Payer: Self-pay

## 2017-08-07 NOTE — Telephone Encounter (Signed)
Attempted to confirm remote transmission with pt. No answer and was unable to leave a message.   

## 2017-08-08 ENCOUNTER — Encounter: Payer: Self-pay | Admitting: Cardiology

## 2017-08-11 ENCOUNTER — Other Ambulatory Visit: Payer: Self-pay | Admitting: Internal Medicine

## 2017-08-11 DIAGNOSIS — I4891 Unspecified atrial fibrillation: Secondary | ICD-10-CM

## 2017-08-14 ENCOUNTER — Ambulatory Visit (INDEPENDENT_AMBULATORY_CARE_PROVIDER_SITE_OTHER): Payer: Medicare HMO | Admitting: *Deleted

## 2017-08-14 DIAGNOSIS — I428 Other cardiomyopathies: Secondary | ICD-10-CM | POA: Diagnosis not present

## 2017-08-14 MED ORDER — CEFAZOLIN SODIUM-DEXTROSE 2-4 GM/100ML-% IV SOLN
2.0000 g | INTRAVENOUS | Status: AC
  Administered 2017-08-16: 2 g via INTRAVENOUS
  Filled 2017-08-14: qty 100

## 2017-08-14 MED ORDER — TRANEXAMIC ACID 1000 MG/10ML IV SOLN
2000.0000 mg | INTRAVENOUS | Status: AC
  Administered 2017-08-16: 2000 mg via TOPICAL
  Filled 2017-08-14: qty 20

## 2017-08-14 MED ORDER — TRANEXAMIC ACID 1000 MG/10ML IV SOLN
1000.0000 mg | INTRAVENOUS | Status: AC
  Administered 2017-08-16: 1000 mg via INTRAVENOUS
  Filled 2017-08-14: qty 1100

## 2017-08-14 MED ORDER — BUPIVACAINE LIPOSOME 1.3 % IJ SUSP
20.0000 mL | INTRAMUSCULAR | Status: DC
  Filled 2017-08-14: qty 20

## 2017-08-15 NOTE — Anesthesia Preprocedure Evaluation (Addendum)
Anesthesia Evaluation  Patient identified by MRN, date of birth, ID band Patient awake    Reviewed: Allergy & Precautions, NPO status , Patient's Chart, lab work & pertinent test results, reviewed documented beta blocker date and time   Airway Mallampati: II  TM Distance: >3 FB Neck ROM: Full    Dental  (+) Dental Advisory Given,    Pulmonary neg pulmonary ROS,    breath sounds clear to auscultation       Cardiovascular hypertension, Pt. on home beta blockers and Pt. on medications + CAD (mild non-obstructive CAD by 2018 Cath.) and +CHF  + dysrhythmias + Cardiac Defibrillator  Rhythm:Regular Rate:Normal  EF 15-20% on 2018 Echo. ICD for non-ischemic cardiomyopathy.   Neuro/Psych negative neurological ROS     GI/Hepatic negative GI ROS, Neg liver ROS,   Endo/Other  negative endocrine ROS  Renal/GU negative Renal ROS     Musculoskeletal  (+) Arthritis ,   Abdominal   Peds  Hematology negative hematology ROS (+)   Anesthesia Other Findings   Reproductive/Obstetrics                           Anesthesia Physical  Anesthesia Plan  ASA: IV  Anesthesia Plan: Spinal   Post-op Pain Management:  Regional for Post-op pain   Induction:   PONV Risk Score and Plan: Ondansetron and Treatment may vary due to age or medical condition  Airway Management Planned: Natural Airway  Additional Equipment: Arterial line  Intra-op Plan:   Post-operative Plan:   Informed Consent: I have reviewed the patients History and Physical, chart, labs and discussed the procedure including the risks, benefits and alternatives for the proposed anesthesia with the patient or authorized representative who has indicated his/her understanding and acceptance.   Dental advisory given  Plan Discussed with: CRNA  Anesthesia Plan Comments:        Anesthesia Quick Evaluation

## 2017-08-16 ENCOUNTER — Encounter: Payer: Self-pay | Admitting: Cardiology

## 2017-08-16 ENCOUNTER — Observation Stay (HOSPITAL_COMMUNITY)
Admission: RE | Admit: 2017-08-16 | Discharge: 2017-08-17 | Disposition: A | Discharge status: Home / Self Care | Payer: Medicare HMO | Source: Ambulatory Visit | Attending: Orthopedic Surgery | Admitting: Orthopedic Surgery

## 2017-08-16 ENCOUNTER — Encounter (HOSPITAL_COMMUNITY): Payer: Self-pay | Admitting: *Deleted

## 2017-08-16 ENCOUNTER — Inpatient Hospital Stay (HOSPITAL_COMMUNITY): Payer: Medicare HMO | Admitting: Emergency Medicine

## 2017-08-16 ENCOUNTER — Other Ambulatory Visit: Payer: Self-pay

## 2017-08-16 ENCOUNTER — Encounter (HOSPITAL_COMMUNITY)
Admission: RE | Disposition: A | Discharge status: Home / Self Care | Payer: Self-pay | Source: Ambulatory Visit | Attending: Orthopedic Surgery

## 2017-08-16 DIAGNOSIS — M24561 Contracture, right knee: Secondary | ICD-10-CM | POA: Diagnosis not present

## 2017-08-16 DIAGNOSIS — I481 Persistent atrial fibrillation: Secondary | ICD-10-CM | POA: Diagnosis not present

## 2017-08-16 DIAGNOSIS — Z886 Allergy status to analgesic agent status: Secondary | ICD-10-CM | POA: Diagnosis not present

## 2017-08-16 DIAGNOSIS — Z95810 Presence of automatic (implantable) cardiac defibrillator: Secondary | ICD-10-CM | POA: Insufficient documentation

## 2017-08-16 DIAGNOSIS — M21161 Varus deformity, not elsewhere classified, right knee: Secondary | ICD-10-CM | POA: Diagnosis not present

## 2017-08-16 DIAGNOSIS — M1711 Unilateral primary osteoarthritis, right knee: Secondary | ICD-10-CM | POA: Diagnosis not present

## 2017-08-16 DIAGNOSIS — Z7901 Long term (current) use of anticoagulants: Secondary | ICD-10-CM | POA: Diagnosis not present

## 2017-08-16 DIAGNOSIS — I4891 Unspecified atrial fibrillation: Secondary | ICD-10-CM | POA: Diagnosis not present

## 2017-08-16 DIAGNOSIS — Z881 Allergy status to other antibiotic agents status: Secondary | ICD-10-CM | POA: Diagnosis not present

## 2017-08-16 DIAGNOSIS — I2510 Atherosclerotic heart disease of native coronary artery without angina pectoris: Secondary | ICD-10-CM | POA: Insufficient documentation

## 2017-08-16 DIAGNOSIS — Z79899 Other long term (current) drug therapy: Secondary | ICD-10-CM | POA: Insufficient documentation

## 2017-08-16 DIAGNOSIS — Z96659 Presence of unspecified artificial knee joint: Secondary | ICD-10-CM

## 2017-08-16 DIAGNOSIS — Z8546 Personal history of malignant neoplasm of prostate: Secondary | ICD-10-CM | POA: Insufficient documentation

## 2017-08-16 DIAGNOSIS — I5042 Chronic combined systolic (congestive) and diastolic (congestive) heart failure: Secondary | ICD-10-CM | POA: Insufficient documentation

## 2017-08-16 DIAGNOSIS — G8918 Other acute postprocedural pain: Secondary | ICD-10-CM | POA: Diagnosis not present

## 2017-08-16 HISTORY — PX: TOTAL KNEE ARTHROPLASTY: SHX125

## 2017-08-16 LAB — APTT: aPTT: 29 seconds (ref 24–36)

## 2017-08-16 LAB — PROTIME-INR
INR: 1.11
Prothrombin Time: 14.3 seconds (ref 11.4–15.2)

## 2017-08-16 SURGERY — ARTHROPLASTY, KNEE, TOTAL
Anesthesia: Choice | Site: Knee | Laterality: Right

## 2017-08-16 MED ORDER — OXYCODONE HCL 5 MG PO TABS: 5.0000 mg | ORAL_TABLET | Freq: Once | ORAL | Status: DC | PRN

## 2017-08-16 MED ORDER — ATORVASTATIN CALCIUM 40 MG PO TABS
40.0000 mg | ORAL_TABLET | Freq: Every evening | ORAL | Status: DC
  Administered 2017-08-16: 40 mg via ORAL
  Filled 2017-08-16: qty 1

## 2017-08-16 MED ORDER — LACTATED RINGERS IV SOLN
INTRAVENOUS | Status: DC | PRN
  Administered 2017-08-16: 08:00:00 via INTRAVENOUS

## 2017-08-16 MED ORDER — METOCLOPRAMIDE HCL 5 MG PO TABS: 5.0000 mg | ORAL_TABLET | Freq: Three times a day (TID) | ORAL | Status: DC | PRN

## 2017-08-16 MED ORDER — TRANEXAMIC ACID 1000 MG/10ML IV SOLN
1000.0000 mg | Freq: Once | INTRAVENOUS | Status: AC
  Administered 2017-08-16: 1000 mg via INTRAVENOUS
  Filled 2017-08-16: qty 10

## 2017-08-16 MED ORDER — MIDAZOLAM HCL 5 MG/5ML IJ SOLN
INTRAMUSCULAR | Status: DC | PRN
  Administered 2017-08-16 (×2): 1 mg via INTRAVENOUS

## 2017-08-16 MED ORDER — MIDAZOLAM HCL 2 MG/2ML IJ SOLN
INTRAMUSCULAR | Status: AC
  Filled 2017-08-16: qty 2

## 2017-08-16 MED ORDER — SODIUM CHLORIDE 0.9 % IV SOLN
INTRAVENOUS | Status: DC
  Administered 2017-08-16 – 2017-08-17 (×2): via INTRAVENOUS

## 2017-08-16 MED ORDER — BISACODYL 10 MG RE SUPP: 10.0000 mg | Freq: Every day | RECTAL | Status: DC | PRN

## 2017-08-16 MED ORDER — ONDANSETRON HCL 4 MG PO TABS: 4.0000 mg | ORAL_TABLET | Freq: Four times a day (QID) | ORAL | Status: DC | PRN

## 2017-08-16 MED ORDER — PROMETHAZINE HCL 25 MG/ML IJ SOLN: 6.2500 mg | INTRAMUSCULAR | Status: DC | PRN

## 2017-08-16 MED ORDER — MENTHOL 3 MG MT LOZG: 1.0000 | LOZENGE | OROMUCOSAL | Status: DC | PRN

## 2017-08-16 MED ORDER — LIDOCAINE 2% (20 MG/ML) 5 ML SYRINGE
INTRAMUSCULAR | Status: AC
  Filled 2017-08-16: qty 5

## 2017-08-16 MED ORDER — VITAMIN D 1000 UNITS PO TABS
1000.0000 [IU] | ORAL_TABLET | Freq: Every day | ORAL | Status: DC
  Administered 2017-08-17: 1000 [IU] via ORAL
  Filled 2017-08-16: qty 1

## 2017-08-16 MED ORDER — ADULT MULTIVITAMIN W/MINERALS CH
1.0000 | ORAL_TABLET | Freq: Every day | ORAL | Status: DC
  Administered 2017-08-17: 1 via ORAL
  Filled 2017-08-16: qty 1

## 2017-08-16 MED ORDER — CEFAZOLIN SODIUM-DEXTROSE 1-4 GM/50ML-% IV SOLN
1.0000 g | Freq: Four times a day (QID) | INTRAVENOUS | Status: AC
  Administered 2017-08-16 (×2): 1 g via INTRAVENOUS
  Filled 2017-08-16 (×2): qty 50

## 2017-08-16 MED ORDER — PROPOFOL 500 MG/50ML IV EMUL
INTRAVENOUS | Status: DC | PRN
  Administered 2017-08-16: 50 ug/kg/min via INTRAVENOUS

## 2017-08-16 MED ORDER — HYDROMORPHONE HCL 1 MG/ML IJ SOLN
0.5000 mg | INTRAMUSCULAR | Status: DC | PRN
  Administered 2017-08-16 – 2017-08-17 (×4): 1 mg via INTRAVENOUS
  Filled 2017-08-16 (×4): qty 1

## 2017-08-16 MED ORDER — SODIUM CHLORIDE 0.9% FLUSH
INTRAVENOUS | Status: DC | PRN
  Administered 2017-08-16: 50 mL

## 2017-08-16 MED ORDER — FENTANYL CITRATE (PF) 250 MCG/5ML IJ SOLN
INTRAMUSCULAR | Status: AC
  Filled 2017-08-16: qty 5

## 2017-08-16 MED ORDER — MEPERIDINE HCL 50 MG/ML IJ SOLN: 6.2500 mg | INTRAMUSCULAR | Status: DC | PRN

## 2017-08-16 MED ORDER — BUPIVACAINE IN DEXTROSE 0.75-8.25 % IT SOLN
INTRATHECAL | Status: DC | PRN
  Administered 2017-08-16: 15 mg via INTRATHECAL

## 2017-08-16 MED ORDER — PHENYLEPHRINE HCL 10 MG/ML IJ SOLN
INTRAVENOUS | Status: DC | PRN
  Administered 2017-08-16: 25 ug/min via INTRAVENOUS

## 2017-08-16 MED ORDER — OXYCODONE HCL 5 MG PO TABS
5.0000 mg | ORAL_TABLET | ORAL | Status: DC | PRN
  Administered 2017-08-16 – 2017-08-17 (×6): 10 mg via ORAL
  Filled 2017-08-16 (×6): qty 2

## 2017-08-16 MED ORDER — BUPIVACAINE-EPINEPHRINE (PF) 0.25% -1:200000 IJ SOLN
INTRAMUSCULAR | Status: DC | PRN
  Administered 2017-08-16: 50 mL

## 2017-08-16 MED ORDER — SACUBITRIL-VALSARTAN 24-26 MG PO TABS
1.0000 | ORAL_TABLET | Freq: Two times a day (BID) | ORAL | Status: DC
  Administered 2017-08-16 – 2017-08-17 (×2): 1 via ORAL
  Filled 2017-08-16 (×2): qty 1

## 2017-08-16 MED ORDER — CARVEDILOL 3.125 MG PO TABS
3.1250 mg | ORAL_TABLET | Freq: Two times a day (BID) | ORAL | Status: DC
  Administered 2017-08-16 – 2017-08-17 (×2): 3.125 mg via ORAL
  Filled 2017-08-16 (×2): qty 1

## 2017-08-16 MED ORDER — BUPIVACAINE LIPOSOME 1.3 % IJ SUSP
INTRAMUSCULAR | Status: DC | PRN
  Administered 2017-08-16: 20 mL

## 2017-08-16 MED ORDER — ROPIVACAINE HCL 7.5 MG/ML IJ SOLN
INTRAMUSCULAR | Status: DC | PRN
  Administered 2017-08-16: 20 mL via PERINEURAL

## 2017-08-16 MED ORDER — BUPIVACAINE-EPINEPHRINE 0.25% -1:200000 IJ SOLN
INTRAMUSCULAR | Status: AC
  Filled 2017-08-16: qty 1

## 2017-08-16 MED ORDER — ACETAMINOPHEN 325 MG PO TABS: 325.0000 mg | ORAL_TABLET | Freq: Four times a day (QID) | ORAL | Status: DC | PRN

## 2017-08-16 MED ORDER — DOCUSATE SODIUM 100 MG PO CAPS
100.0000 mg | ORAL_CAPSULE | Freq: Two times a day (BID) | ORAL | Status: DC
  Administered 2017-08-16 – 2017-08-17 (×2): 100 mg via ORAL
  Filled 2017-08-16 (×2): qty 1

## 2017-08-16 MED ORDER — CHLORHEXIDINE GLUCONATE 4 % EX LIQD: 60.0000 mL | Freq: Once | CUTANEOUS | Status: DC

## 2017-08-16 MED ORDER — FUROSEMIDE 40 MG PO TABS
40.0000 mg | ORAL_TABLET | Freq: Two times a day (BID) | ORAL | Status: DC
  Administered 2017-08-16 – 2017-08-17 (×2): 40 mg via ORAL
  Filled 2017-08-16 (×2): qty 1

## 2017-08-16 MED ORDER — FENTANYL CITRATE (PF) 100 MCG/2ML IJ SOLN
INTRAMUSCULAR | Status: DC | PRN
  Administered 2017-08-16: 50 ug via INTRAVENOUS

## 2017-08-16 MED ORDER — PHENOL 1.4 % MT LIQD: 1.0000 | OROMUCOSAL | Status: DC | PRN

## 2017-08-16 MED ORDER — ETOMIDATE 2 MG/ML IV SOLN
INTRAVENOUS | Status: AC
  Filled 2017-08-16: qty 10

## 2017-08-16 MED ORDER — ONDANSETRON HCL 4 MG/2ML IJ SOLN
INTRAMUSCULAR | Status: DC | PRN
  Administered 2017-08-16: 4 mg via INTRAVENOUS

## 2017-08-16 MED ORDER — METOCLOPRAMIDE HCL 5 MG/ML IJ SOLN: 5.0000 mg | Freq: Three times a day (TID) | INTRAMUSCULAR | Status: DC | PRN

## 2017-08-16 MED ORDER — 0.9 % SODIUM CHLORIDE (POUR BTL) OPTIME
TOPICAL | Status: DC | PRN
  Administered 2017-08-16: 1000 mL

## 2017-08-16 MED ORDER — RIVAROXABAN 20 MG PO TABS: 20.0000 mg | ORAL_TABLET | Freq: Every day | ORAL | Status: DC

## 2017-08-16 MED ORDER — FENTANYL CITRATE (PF) 100 MCG/2ML IJ SOLN: 25.0000 ug | INTRAMUSCULAR | Status: DC | PRN

## 2017-08-16 MED ORDER — POTASSIUM CHLORIDE ER 10 MEQ PO TBCR
10.0000 meq | EXTENDED_RELEASE_TABLET | Freq: Two times a day (BID) | ORAL | Status: DC
  Administered 2017-08-16 – 2017-08-17 (×2): 10 meq via ORAL
  Filled 2017-08-16 (×5): qty 1

## 2017-08-16 MED ORDER — FLEET ENEMA 7-19 GM/118ML RE ENEM: 1.0000 | ENEMA | Freq: Once | RECTAL | Status: DC | PRN

## 2017-08-16 MED ORDER — SODIUM CHLORIDE 0.9 % IV SOLN: INTRAVENOUS | Status: DC

## 2017-08-16 MED ORDER — ACETAMINOPHEN 500 MG PO TABS
1000.0000 mg | ORAL_TABLET | Freq: Four times a day (QID) | ORAL | Status: AC
  Administered 2017-08-16 – 2017-08-17 (×4): 1000 mg via ORAL
  Filled 2017-08-16 (×4): qty 2

## 2017-08-16 MED ORDER — ONDANSETRON HCL 4 MG/2ML IJ SOLN: 4.0000 mg | Freq: Four times a day (QID) | INTRAMUSCULAR | Status: DC | PRN

## 2017-08-16 MED ORDER — OXYCODONE-ACETAMINOPHEN 7.5-325 MG PO TABS: 1.0000 | ORAL_TABLET | ORAL | 0 refills | Status: DC | PRN

## 2017-08-16 MED ORDER — SOTALOL HCL 120 MG PO TABS
120.0000 mg | ORAL_TABLET | Freq: Every day | ORAL | Status: DC
  Administered 2017-08-17: 120 mg via ORAL
  Filled 2017-08-16: qty 1

## 2017-08-16 MED ORDER — PROPOFOL 10 MG/ML IV BOLUS
INTRAVENOUS | Status: AC
  Filled 2017-08-16: qty 20

## 2017-08-16 MED ORDER — OXYCODONE HCL 5 MG/5ML PO SOLN: 5.0000 mg | Freq: Once | ORAL | Status: DC | PRN

## 2017-08-16 MED ORDER — POLYETHYLENE GLYCOL 3350 17 G PO PACK: 17.0000 g | PACK | Freq: Every day | ORAL | Status: DC | PRN

## 2017-08-16 MED ORDER — SODIUM CHLORIDE 0.9 % IR SOLN
Status: DC | PRN
  Administered 2017-08-16: 3000 mL

## 2017-08-16 MED ORDER — DIPHENHYDRAMINE HCL 12.5 MG/5ML PO ELIX: 12.5000 mg | ORAL_SOLUTION | ORAL | Status: DC | PRN

## 2017-08-16 SURGICAL SUPPLY — 63 items
BANDAGE ACE 4X5 VEL STRL LF (GAUZE/BANDAGES/DRESSINGS) ×2 IMPLANT
BANDAGE ACE 6X5 VEL STRL LF (GAUZE/BANDAGES/DRESSINGS) ×2 IMPLANT
BANDAGE ESMARK 6X9 LF (GAUZE/BANDAGES/DRESSINGS) ×1 IMPLANT
BLADE SAGITTAL 25.0X1.19X90 (BLADE) ×2 IMPLANT
BLADE SAW SAG 90X13X1.27 (BLADE) ×2 IMPLANT
BLADE SURG 10 STRL SS (BLADE) ×2 IMPLANT
BNDG ESMARK 6X9 LF (GAUZE/BANDAGES/DRESSINGS) ×2
BOWL SMART MIX CTS (DISPOSABLE) ×2 IMPLANT
CAP KNEE TOTAL 3 SIGMA ×2 IMPLANT
CEMENT HV SMART SET (Cement) ×4 IMPLANT
COVER SURGICAL LIGHT HANDLE (MISCELLANEOUS) ×4 IMPLANT
CUFF TOURNIQUET SINGLE 34IN LL (TOURNIQUET CUFF) ×2 IMPLANT
CUFF TOURNIQUET SINGLE 44IN (TOURNIQUET CUFF) IMPLANT
DRAPE INCISE IOBAN 66X45 STRL (DRAPES) IMPLANT
DRAPE ORTHO SPLIT 77X108 STRL (DRAPES) ×2
DRAPE SURG ORHT 6 SPLT 77X108 (DRAPES) ×2 IMPLANT
DRAPE U-SHAPE 47X51 STRL (DRAPES) ×2 IMPLANT
DRSG ADAPTIC 3X8 NADH LF (GAUZE/BANDAGES/DRESSINGS) ×2 IMPLANT
DRSG PAD ABDOMINAL 8X10 ST (GAUZE/BANDAGES/DRESSINGS) ×4 IMPLANT
DURAPREP 26ML APPLICATOR (WOUND CARE) ×2 IMPLANT
ELECT REM PT RETURN 9FT ADLT (ELECTROSURGICAL) ×2
ELECTRODE REM PT RTRN 9FT ADLT (ELECTROSURGICAL) ×1 IMPLANT
EVACUATOR 1/8 PVC DRAIN (DRAIN) IMPLANT
FACESHIELD WRAPAROUND (MASK) ×2 IMPLANT
GAUZE SPONGE 4X4 12PLY STRL (GAUZE/BANDAGES/DRESSINGS) ×2 IMPLANT
GLOVE BIOGEL PI IND STRL 8 (GLOVE) ×4 IMPLANT
GLOVE BIOGEL PI INDICATOR 8 (GLOVE) ×4
GLOVE ORTHO TXT STRL SZ7.5 (GLOVE) ×2 IMPLANT
GLOVE SURG ORTHO 8.0 STRL STRW (GLOVE) ×2 IMPLANT
GOWN STRL REUS W/ TWL LRG LVL3 (GOWN DISPOSABLE) ×1 IMPLANT
GOWN STRL REUS W/ TWL XL LVL3 (GOWN DISPOSABLE) ×1 IMPLANT
GOWN STRL REUS W/TWL 2XL LVL3 (GOWN DISPOSABLE) ×2 IMPLANT
GOWN STRL REUS W/TWL LRG LVL3 (GOWN DISPOSABLE) ×1
GOWN STRL REUS W/TWL XL LVL3 (GOWN DISPOSABLE) ×1
HANDPIECE INTERPULSE COAX TIP (DISPOSABLE) ×1
HOOD PEEL AWAY FACE SHEILD DIS (HOOD) ×2 IMPLANT
IMMOBILIZER KNEE 22 UNIV (SOFTGOODS) ×2 IMPLANT
KIT BASIN OR (CUSTOM PROCEDURE TRAY) ×2 IMPLANT
KIT TURNOVER KIT B (KITS) ×2 IMPLANT
MANIFOLD NEPTUNE II (INSTRUMENTS) ×2 IMPLANT
NEEDLE 22X1 1/2 (OR ONLY) (NEEDLE) ×4 IMPLANT
NS IRRIG 1000ML POUR BTL (IV SOLUTION) ×2 IMPLANT
PACK TOTAL JOINT (CUSTOM PROCEDURE TRAY) ×2 IMPLANT
PAD ABD 8X10 STRL (GAUZE/BANDAGES/DRESSINGS) ×4 IMPLANT
PAD ARMBOARD 7.5X6 YLW CONV (MISCELLANEOUS) ×4 IMPLANT
PAD CAST 4YDX4 CTTN HI CHSV (CAST SUPPLIES) ×1 IMPLANT
PADDING CAST COTTON 4X4 STRL (CAST SUPPLIES) ×1
PADDING CAST COTTON 6X4 STRL (CAST SUPPLIES) ×2 IMPLANT
SET HNDPC FAN SPRY TIP SCT (DISPOSABLE) ×1 IMPLANT
STAPLER VISISTAT 35W (STAPLE) ×4 IMPLANT
SUCTION FRAZIER HANDLE 10FR (MISCELLANEOUS) ×1
SUCTION TUBE FRAZIER 10FR DISP (MISCELLANEOUS) ×1 IMPLANT
SUT ETHIBOND NAB CT1 #1 30IN (SUTURE) ×6 IMPLANT
SUT VIC AB 0 CT1 27 (SUTURE) ×1
SUT VIC AB 0 CT1 27XBRD ANBCTR (SUTURE) ×1 IMPLANT
SUT VIC AB 2-0 CT1 27 (SUTURE) ×2
SUT VIC AB 2-0 CT1 TAPERPNT 27 (SUTURE) ×2 IMPLANT
SYR CONTROL 10ML LL (SYRINGE) ×4 IMPLANT
TOWEL GREEN STERILE (TOWEL DISPOSABLE) ×2 IMPLANT
TRAY CATH 16FR W/PLASTIC CATH (SET/KITS/TRAYS/PACK) IMPLANT
TRAY FOLEY MTR SLVR 16FR STAT (SET/KITS/TRAYS/PACK) IMPLANT
WATER STERILE IRR 1000ML POUR (IV SOLUTION) ×2 IMPLANT
YANKAUER SUCT BULB TIP NO VENT (SUCTIONS) ×2 IMPLANT

## 2017-08-16 NOTE — Progress Notes (Signed)
Remote ICD transmission.   

## 2017-08-16 NOTE — Anesthesia Procedure Notes (Signed)
Procedure Name: MAC Date/Time: 08/16/2017 7:50 AM Performed by: Jenne Campus, CRNA Pre-anesthesia Checklist: Patient identified, Emergency Drugs available, Suction available, Patient being monitored and Timeout performed Oxygen Delivery Method: Simple face mask

## 2017-08-16 NOTE — Care Management Note (Addendum)
Case Management Note  Patient Details  Name: Jon Williams MRN: [id number] Date of Birth: Sep 05, 1950  Subjective/Objective:   67 yr old gentleman s/p right total knee arthroplasty.                 Action/Plan: Case manager spoke with patient and his wife concerning discharge plan and DME. Choice for Home Health agency was offered, patient was preoperatively setup with [facility].  CPM and RW have been delivered to patient's home, he will have family support at discharge.     Expected Discharge Date:   08/17/17               Expected Discharge Plan:  [place]  In-House Referral:  NA  Discharge planning Services  CM Consult  Post Acute Care Choice:  Home Health, Durable Medical Equipment Choice offered to:  Patient, Spouse  DME Arranged:  [place] rolling, CPM DME Agency:  [employer]  HH Arranged:  PT HH Agency:  [facility] Status of Service:  Completed, signed off  If discussed at [place] of Stay Meetings, dates discussed:    Additional Comments:  Ninfa Meeker, RN 08/16/2017, 3:37 PM

## 2017-08-16 NOTE — Interval H&P Note (Signed)
History and Physical Interval Note:  08/16/2017 7:37 AM  Jon Williams  has presented today for surgery, with the diagnosis of OA RIGHT KNEE  The various methods of treatment have been discussed with the patient and family. After consideration of risks, benefits and other options for treatment, the patient has consented to  Procedure(s): RIGHT TOTAL KNEE ARTHROPLASTY (Right) as a surgical intervention .  The patient's history has been reviewed, patient examined, no change in status, stable for surgery.  I have reviewed the patient's chart and labs.  Questions were answered to the patient's satisfaction.     Yvette Rack

## 2017-08-16 NOTE — Op Note (Signed)
NAME: Jon Williams, Jon Williams MEDICAL RECORD MG:[id number] ACCOUNT [id number] DATE OF BIRTH:03-03-1950 FACILITY: [facility] LOCATION: [facility] PHYSICIAN:W. Kynzie Polgar JR., MD  OPERATIVE REPORT  DATE OF PROCEDURE:  08/16/2017  PREOPERATIVE DIAGNOSIS:  Severe osteoarthritis, right knee with varus deformity placed contracture.  POSTOPERATIVE DIAGNOSIS:  Severe osteoarthritis, right knee with varus deformity placed contracture.  PROCEDURE:  Right total knee replacement (Sigma size 5 femur tibia 15 mm bearing with 38 mm all poly patella).  ANESTHESIA:  Spinal with adductor block.  TOURNIQUET TIME:  65 minutes.  DESCRIPTION OF PROCEDURE:  Exsanguination of the leg inflation of 350 tourniquet.  Straight skin incision with a medial parapatellar approach to the knee made.  Severe varus deformity and flexion contracture was noted.  We did an extensive stripping on  the medial side of the knee to allow access to the knee as well as stripping the tight medial collateral.  We did an 11 mm 5 degree valgus cut on the femur followed by cutting about 2 mm below the most diseased medial compartment.  Despite cutting  minimal cut on the medial side, we ended up with an extension gap of 15 mm.  The femur was sized to be a size 5.  By placed an in all-in-1 cutting block in appropriate degree of external rotation followed by anterior, posterior chamfer cuts.  The keel  was cut for the tibial baseplate with placement of a trial tibial baseplate.  Box cut was made on the femur after that.  Trials were placed and again the flexion contracture and varus was resolved nicely with full extension, good balance of ligaments,  good stability and flexion to all range of motion.  Patella was cut leaving about 17 mm of native patella for a 38 mm all poly trial and then all trials were placed and deemed to be acceptable in terms of range of motion, full extension as well as good  stability and balance.  Trial components were  removed.  Pulsatile lavage was used on the bony surfaces.  Exparel and Marcaine infiltrated in the subcutaneous tissues.  Final components were inserted.  Tibia followed by femur and patella.  We elected to  use the trial bearing when the cement hardened.  Prior to release of the tourniquet, the trial bearing was removed, tourniquet was released.  No excessive bleeding was noted.  Small bits of cement were noted prior to release of the tourniquet removed.   Final bearing was placed and again all parameters deemed to be acceptable.  Closure was effective with a #1 Ethibond, 2-0 Vicryl and skin clips.  A compressive sterile dressing and knee immobilizer applied.  Taken to recovery room in stable condition.  TN/NUANCE  D:08/16/2017 T:08/16/2017 JOB:[id number]

## 2017-08-16 NOTE — Transfer of Care (Signed)
Immediate Anesthesia Transfer of Care Note  Patient: Jon Williams  Procedure(s) Performed: RIGHT TOTAL KNEE ARTHROPLASTY (Right Knee)  Patient Location: PACU  Anesthesia Type:MAC and Spinal  Level of Consciousness: awake, oriented and patient cooperative  Airway & Oxygen Therapy: Patient Spontanous Breathing and Patient connected to face mask oxygen  Post-op Assessment: Report given to RN and Post -op Vital signs reviewed and stable  Post vital signs: Reviewed  Last Vitals:  Vitals Value Taken Time  BP 94/58 08/16/2017 10:06 AM  Temp    Pulse 72 08/16/2017 10:08 AM  Resp 22 08/16/2017 10:10 AM  SpO2 86 % 08/16/2017 10:09 AM  Vitals shown include unvalidated device data.  Last Pain:  Vitals:   08/16/17 0603  TempSrc:   PainSc: 6          Complications: No apparent anesthesia complications

## 2017-08-16 NOTE — Care Management CC44 (Signed)
Condition Code 44 Documentation Completed  Patient Details  Name: JAMARKUS LISBON MRN: [id number] Date of Birth: [birth year]   Condition Code 44 given:  Yes Patient signature on Condition Code 44 notice:  Yes Documentation of 2 MD's agreement:  Yes Code 44 added to claim:  Yes    Ninfa Meeker, RN 08/16/2017, 3:30 PM

## 2017-08-16 NOTE — Anesthesia Procedure Notes (Signed)
Anesthesia Regional Block: Adductor canal block   Pre-Anesthetic Checklist: ,, timeout performed, Correct Patient, Correct Site, Correct Laterality, Correct Procedure, Correct Position, site marked, Risks and benefits discussed,  Surgical consent,  Pre-op evaluation,  At surgeon's request and post-op pain management  Laterality: Right  Prep: chloraprep       Needles:  Injection technique: Single-shot  Needle Type: Stimiplex     Needle Length: 9cm  Needle Gauge: 21     Additional Needles:   Procedures:,,,, ultrasound used (permanent image in chart),,,,  Narrative:  Start time: 08/16/2017 7:24 AM End time: 08/16/2017 7:26 AM Injection made incrementally with aspirations every 5 mL.  Performed by: Personally  Anesthesiologist: Nolon Nations, MD  Additional Notes: BP cuff, EKG monitors applied. Sedation begun. Artery and nerve location verified with U/S and anesthetic injected incrementally, slowly, and after negative aspirations under direct u/s guidance. Good fascial /perineural spread. Tolerated well.

## 2017-08-16 NOTE — Brief Op Note (Signed)
08/16/2017  9:55 AM  PATIENT:  Jon Williams  67 y.o. male  PRE-OPERATIVE DIAGNOSIS:  OA RIGHT KNEE  POST-OPERATIVE DIAGNOSIS:  OA RIGHT KNEE  PROCEDURE:  Procedure(s): RIGHT TOTAL KNEE ARTHROPLASTY (Right)  SURGEON:  Surgeon(s) and Role:    [name], MD - Primary  PHYSICIAN ASSISTANT: Chriss Czar, PA-C  ASSISTANTS:    ANESTHESIA:   local, spinal and IV sedation  EBL:  50 mL   BLOOD ADMINISTERED:none  DRAINS: none   LOCAL MEDICATIONS USED:  MARCAINE     SPECIMEN:  No Specimen  DISPOSITION OF SPECIMEN:  N/A  COUNTS:  YES  TOURNIQUET:   Total Tourniquet Time Documented: Thigh (Right) - 69 minutes Total: Thigh (Right) - 69 minutes   DICTATION: .Other Dictation: Dictation Number Unknown  PLAN OF CARE: Admit to inpatient   PATIENT DISPOSITION:  PACU - hemodynamically stable.   Delay start of Pharmacological VTE agent (>24hrs) due to surgical blood loss or risk of bleeding: yes

## 2017-08-16 NOTE — Anesthesia Procedure Notes (Signed)
Spinal  Patient location during procedure: OR Staffing Anesthesiologist: Nolon Nations, MD Performed: anesthesiologist  Preanesthetic Checklist Completed: patient identified, site marked, surgical consent, pre-op evaluation, timeout performed, IV checked, risks and benefits discussed and monitors and equipment checked Spinal Block Patient position: sitting Prep: ChloraPrep Patient monitoring: heart rate, continuous pulse ox and blood pressure Approach: right paramedian Location: L3-4 Injection technique: single-shot Needle Needle type: Sprotte  Needle gauge: 24 G Needle length: 9 cm Assessment Sensory level: T8 Additional Notes Expiration date of kit checked and confirmed. Patient tolerated procedure well, without complications.

## 2017-08-16 NOTE — Progress Notes (Signed)
1150 Received pt from PACU, A&O x4. On CPM 0-90 deg, tolerating well. RLE with compression wrap dry and intact. Denies pain at this time.

## 2017-08-16 NOTE — Anesthesia Procedure Notes (Signed)
Arterial Line Insertion Start/End7/06/2017 7:00 AM, 08/16/2017 7:15 AM Performed by: Lavell Luster, CRNA, CRNA  Patient location: Pre-op. Preanesthetic checklist: patient identified, IV checked, site marked, risks and benefits discussed, surgical consent, monitors and equipment checked, pre-op evaluation and timeout performed Lidocaine 1% used for infiltration Right, radial was placed Catheter size: 20 G Hand hygiene performed , maximum sterile barriers used  and Seldinger technique used Allen's test indicative of satisfactory collateral circulation Attempts: 2 Procedure performed without using ultrasound guided technique. Following insertion, Biopatch and dressing applied. Post procedure assessment: normal  Patient tolerated the procedure well with no immediate complications.

## 2017-08-16 NOTE — Progress Notes (Signed)
Orthopedic Tech Progress Note Patient Details:  Jon Williams 05-23-1950 [id number]  CPM Right Knee CPM Right Knee: On Right Knee Flexion (Degrees): 90 Right Knee Extension (Degrees): 0 Additional Comments: foot roll  Post Interventions Patient Tolerated: Well Instructions Provided: Care of device  [place] Pink 08/16/2017, 10:32 AM

## 2017-08-16 NOTE — Discharge Instructions (Signed)
INSTRUCTIONS AFTER JOINT REPLACEMENT  ° °o Remove items at home which could result in a fall. This includes throw rugs or furniture in walking pathways °o ICE to the affected joint every three hours while awake for 30 minutes at a time, for at least the first 3-5 days, and then as needed for pain and swelling.  Continue to use ice for pain and swelling. You may notice swelling that will progress down to the foot and ankle.  This is normal after surgery.  Elevate your leg when you are not up walking on it.   °o Continue to use the breathing machine you got in the hospital (incentive spirometer) which will help keep your temperature down.  It is common for your temperature to cycle up and down following surgery, especially at night when you are not up moving around and exerting yourself.  The breathing machine keeps your lungs expanded and your temperature down. ° ° °DIET:  As you were doing prior to hospitalization, we recommend a well-balanced diet. ° °DRESSING / WOUND CARE / SHOWERING ° °You may change your dressing 3-5 days after surgery.  Then change the dressing every day with sterile gauze.  Please use good hand washing techniques before changing the dressing.  Do not use any lotions or creams on the incision until instructed by your surgeon. ° °ACTIVITY ° °o Increase activity slowly as tolerated, but follow the weight bearing instructions below.   °o No driving for 6 weeks or until further direction given by your physician.  You cannot drive while taking narcotics.  °o No lifting or carrying greater than 10 lbs. until further directed by your surgeon. °o Avoid periods of inactivity such as sitting longer than an hour when not asleep. This helps prevent blood clots.  °o You may return to work once you are authorized by your doctor.  ° ° ° °WEIGHT BEARING  ° °Weight bearing as tolerated with assist device (walker, cane, etc) as directed, use it as long as suggested by your surgeon or therapist, typically at  least 4-6 weeks. ° ° °EXERCISES ° °Results after joint replacement surgery are often greatly improved when you follow the exercise, range of motion and muscle strengthening exercises prescribed by your doctor. Safety measures are also important to protect the joint from further injury. Any time any of these exercises cause you to have increased pain or swelling, decrease what you are doing until you are comfortable again and then slowly increase them. If you have problems or questions, call your caregiver or physical therapist for advice.  ° °Rehabilitation is important following a joint replacement. After just a few days of immobilization, the muscles of the leg can become weakened and shrink (atrophy).  These exercises are designed to build up the tone and strength of the thigh and leg muscles and to improve motion. Often times heat used for twenty to thirty minutes before working out will loosen up your tissues and help with improving the range of motion but do not use heat for the first two weeks following surgery (sometimes heat can increase post-operative swelling).  ° °These exercises can be done on a training (exercise) mat, on the floor, on a table or on a bed. Use whatever works the best and is most comfortable for you.    Use music or television while you are exercising so that the exercises are a pleasant break in your day. This will make your life better with the exercises acting as a break   in your routine that you can look forward to.   Perform all exercises about fifteen times, three times per day or as directed.  You should exercise both the operative leg and the other leg as well. ° °Exercises include: °  °• Quad Sets - Tighten up the muscle on the front of the thigh (Quad) and hold for 5-10 seconds.   °• Straight Leg Raises - With your knee straight (if you were given a brace, keep it on), lift the leg to 60 degrees, hold for 3 seconds, and slowly lower the leg.  Perform this exercise against  resistance later as your leg gets stronger.  °• Leg Slides: Lying on your back, slowly slide your foot toward your buttocks, bending your knee up off the floor (only go as far as is comfortable). Then slowly slide your foot back down until your leg is flat on the floor again.  °• Angel Wings: Lying on your back spread your legs to the side as far apart as you can without causing discomfort.  °• Hamstring Strength:  Lying on your back, push your heel against the floor with your leg straight by tightening up the muscles of your buttocks.  Repeat, but this time bend your knee to a comfortable angle, and push your heel against the floor.  You may put a pillow under the heel to make it more comfortable if necessary.  ° °A rehabilitation program following joint replacement surgery can speed recovery and prevent re-injury in the future due to weakened muscles. Contact your doctor or a physical therapist for more information on knee rehabilitation.  ° ° °CONSTIPATION ° °Constipation is defined medically as fewer than three stools per week and severe constipation as less than one stool per week.  Even if you have a regular bowel pattern at home, your normal regimen is likely to be disrupted due to multiple reasons following surgery.  Combination of anesthesia, postoperative narcotics, change in appetite and fluid intake all can affect your bowels.  ° °YOU MUST use at least one of the following options; they are listed in order of increasing strength to get the job done.  They are all available over the counter, and you may need to use some, POSSIBLY even all of these options:   ° °Drink plenty of fluids (prune juice may be helpful) and high fiber foods °Colace 100 mg by mouth twice a day  °Senokot for constipation as directed and as needed Dulcolax (bisacodyl), take with full glass of water  °Miralax (polyethylene glycol) once or twice a day as needed. ° °If you have tried all these things and are unable to have a bowel  movement in the first 3-4 days after surgery call either your surgeon or your primary doctor.   ° °If you experience loose stools or diarrhea, hold the medications until you stool forms back up.  If your symptoms do not get better within 1 week or if they get worse, check with your doctor.  If you experience "the worst abdominal pain ever" or develop nausea or vomiting, please contact the office immediately for further recommendations for treatment. ° ° °ITCHING:  If you experience itching with your medications, try taking only a single pain pill, or even half a pain pill at a time.  You can also use Benadryl over the counter for itching or also to help with sleep.  ° °TED HOSE STOCKINGS:  Use stockings on both legs until for at least 2 weeks or as   directed by physician office. They may be removed at night for sleeping. ° °MEDICATIONS:  See your medication summary on the “After Visit Summary” that nursing will review with you.  You may have some home medications which will be placed on hold until you complete the course of blood thinner medication.  It is important for you to complete the blood thinner medication as prescribed. ° °PRECAUTIONS:  If you experience chest pain or shortness of breath - call 911 immediately for transfer to the hospital emergency department.  ° °If you develop a fever greater that 101 F, purulent drainage from wound, increased redness or drainage from wound, foul odor from the wound/dressing, or calf pain - CONTACT YOUR SURGEON.   °                                                °FOLLOW-UP APPOINTMENTS:  If you do not already have a post-op appointment, please call the office for an appointment to be seen by your surgeon.  Guidelines for how soon to be seen are listed in your “After Visit Summary”, but are typically between 1-4 weeks after surgery. ° °OTHER INSTRUCTIONS:  ° °Knee Replacement:  Do not place pillow under knee, focus on keeping the knee straight while resting. CPM  instructions: 0-90 degrees, 2 hours in the morning, 2 hours in the afternoon, and 2 hours in the evening. Place foam block, curve side up under heel at all times except when in CPM or when walking.  DO NOT modify, tear, cut, or change the foam block in any way. ° °MAKE SURE YOU:  °• Understand these instructions.  °• Get help right away if you are not doing well or get worse.  ° ° °Thank you for letting us be a part of your medical care team.  It is a privilege we respect greatly.  We hope these instructions will help you stay on track for a fast and full recovery!  ° °----------------------------------------------------------------------------------------------------------------------------------------------------- °Information on my medicine - XARELTO® (Rivaroxaban) ° °This medication education was reviewed with me or my healthcare representative as part of my discharge preparation.  ° °Why was Xarelto® prescribed for you? °Xarelto® was prescribed for you to reduce the risk of a blood clot forming that can cause a stroke if you have a medical condition called atrial fibrillation (a type of irregular heartbeat). ° °What do you need to know about xarelto® ? °Take your Xarelto® ONCE DAILY at the same time every day with your evening meal. °If you have difficulty swallowing the tablet whole, you may crush it and mix in applesauce just prior to taking your dose. ° °Take Xarelto® exactly as prescribed by your doctor and DO NOT stop taking Xarelto® without talking to the doctor who prescribed the medication.  Stopping without other stroke prevention medication to take the place of Xarelto® may increase your risk of developing a clot that causes a stroke.  Refill your prescription before you run out. ° °After discharge, you should have regular check-up appointments with your healthcare provider that is prescribing your Xarelto®.  In the future your dose may need to be changed if your kidney function or weight changes by a  significant amount. ° °What do you do if you miss a dose? °If you are taking Xarelto® ONCE DAILY and you miss a dose, take it as soon   as you remember on the same day then continue your regularly scheduled once daily regimen the next day. Do not take two doses of Xarelto® at the same time or on the same day.  ° °Important Safety Information °A possible side effect of Xarelto® is bleeding. You should call your healthcare provider right away if you experience any of the following: °? Bleeding from an injury or your nose that does not stop. °? Unusual colored urine (red or dark brown) or unusual colored stools (red or black). °? Unusual bruising for unknown reasons. °? A serious fall or if you hit your head (even if there is no bleeding). ° °Some medicines may interact with Xarelto® and might increase your risk of bleeding while on Xarelto®. To help avoid this, consult your healthcare provider or pharmacist prior to using any new prescription or non-prescription medications, including herbals, vitamins, non-steroidal anti-inflammatory drugs (NSAIDs) and supplements. ° °This website has more information on Xarelto®: www.xarelto.com. ° °

## 2017-08-16 NOTE — Evaluation (Signed)
Physical Therapy Evaluation Patient Details Name: JOHNTHOMAS LADER MRN: [id number] DOB: August 02, 1950 Today's Date: 08/16/2017   History of Present Illness  Pt is a 67 y/o male s/p R TKA. PMH including but not limited to a-fib, CAD, AICD in 2018.  Clinical Impression  Pt presented supine in bed with HOB elevated, awake and willing to participate in therapy session. Prior to admission, pt was independent with all functional mobility and ADLs. Pt lives with his wife who will be able to provide 24/7 supervision upon d/c. Pt currently requires min guard for bed mobility, min guard for transfers, and min guard to ambulate with RW. Pt would continue to benefit from skilled physical therapy services at this time while admitted and after d/c to address the below listed limitations in order to improve overall safety and independence with functional mobility.     Follow Up Recommendations Home health PT    Equipment Recommendations  None recommended by PT;Other (comment)(pt reported someone delivered a RW to his house)    Recommendations for Other Services       Precautions / Restrictions Precautions Precautions: Fall;Knee Precaution Booklet Issued: Yes (comment) Precaution Comments: PT reviewed positioning of LE following TKA surgery Restrictions Weight Bearing Restrictions: Yes RLE Weight Bearing: Weight bearing as tolerated      Mobility  Bed Mobility Overal bed mobility: Needs Assistance Bed Mobility: Supine to Sit     Supine to sit: Min guard     General bed mobility comments: increased time and effort, min guard for safety  Transfers Overall transfer level: Needs assistance Equipment used: Rolling walker (2 wheeled) Transfers: Sit to/from Stand Sit to Stand: Min guard         General transfer comment: cueing for safe hand placement, min guard for safety  Ambulation/Gait Ambulation/Gait assistance: Min guard Gait Distance (Feet): 30 Feet Assistive device: Rolling walker  (2 wheeled) Gait Pattern/deviations: Step-to pattern;Decreased step length - right;Decreased step length - left;Decreased stride length;Decreased stance time - right;Decreased weight shift to right Gait velocity: decreased Gait velocity interpretation: 1.31 - 2.62 ft/sec, indicative of limited community ambulator General Gait Details: pt with mild instability but no overt LOB or need for physical assistance, min guard for safety  Stairs            Wheelchair Mobility    Modified Rankin (Stroke Patients Only)       Balance Overall balance assessment: Needs assistance Sitting-balance support: Feet supported Sitting balance-Leahy Scale: Fair     Standing balance support: During functional activity;Bilateral upper extremity supported Standing balance-Leahy Scale: Poor                               Pertinent Vitals/Pain Pain Assessment: 0-10 Pain Score: 6  Pain Location: R knee Pain Descriptors / Indicators: Sore Pain Intervention(s): Monitored during session;Repositioned    Home Living Family/patient expects to be discharged to:: Private residence Living Arrangements: Spouse/significant other Available Help at Discharge: Family;Available 24 hours/day Type of Home: House Home Access: Stairs to enter Entrance Stairs-Rails: Psychiatric nurse of Steps: 3 Home Layout: One level Home Equipment: Walker - 2 wheels;Other (comment)(CPM)      Prior Function Level of Independence: Independent               Hand Dominance        Extremity/Trunk Assessment   Upper Extremity Assessment Upper Extremity Assessment: Overall WFL for tasks assessed    Lower Extremity Assessment  Lower Extremity Assessment: RLE deficits/detail RLE Deficits / Details: pt with decreased strength and ROM limitations secondary to post-op pain and weakness    Cervical / Trunk Assessment Cervical / Trunk Assessment: Normal  Communication      Cognition  Arousal/Alertness: Awake/alert Behavior During Therapy: WFL for tasks assessed/performed Overall Cognitive Status: Within Functional Limits for tasks assessed                                        General Comments      Exercises Total Joint Exercises Quad Sets: AROM;Strengthening;Right;10 reps;Seated Long Arc Quad: AROM;Strengthening;Right;10 reps;Seated Marching in Standing: Seated;AROM;Strengthening;Both;10 reps   Assessment/Plan    PT Assessment Patient needs continued PT services  PT Problem List Decreased strength;Decreased activity tolerance;Decreased range of motion;Decreased balance;Decreased mobility;Decreased coordination;Decreased knowledge of use of DME;Decreased knowledge of precautions;Decreased safety awareness;Pain       PT Treatment Interventions DME instruction;Gait training;Stair training;Therapeutic exercise;Functional mobility training;Therapeutic activities;Balance training;Neuromuscular re-education;Patient/family education    PT Goals (Current goals can be found in the Care Plan section)  Acute Rehab PT Goals Patient Stated Goal: decrease pain, return home PT Goal Formulation: With patient/family Time For Goal Achievement: 08/30/17 Potential to Achieve Goals: Good    Frequency 7X/week   Barriers to discharge        Co-evaluation               AM-PAC PT "6 Clicks" Daily Activity  Outcome Measure Difficulty turning over in bed (including adjusting bedclothes, sheets and blankets)?: None Difficulty moving from lying on back to sitting on the side of the bed? : None Difficulty sitting down on and standing up from a chair with arms (e.g., wheelchair, bedside commode, etc,.)?: Unable Help needed moving to and from a bed to chair (including a wheelchair)?: A Little Help needed walking in hospital room?: A Little Help needed climbing 3-5 steps with a railing? : A Little 6 Click Score: 18    End of Session Equipment Utilized  During Treatment: Gait belt Activity Tolerance: Patient tolerated treatment well Patient left: in chair;with call bell/phone within reach;with family/visitor present Nurse Communication: Mobility status PT Visit Diagnosis: Other abnormalities of gait and mobility (R26.89);Pain Pain - Right/Left: Right Pain - part of body: Knee    Time: 2831-5176 PT Time Calculation (min) (ACUTE ONLY): 20 min   Charges:   PT Evaluation $PT Eval Moderate Complexity: 1 Mod     PT G Codes:        Yule[place], PT, DPT [place] 08/16/2017, 4:04 PM

## 2017-08-17 DIAGNOSIS — M24561 Contracture, right knee: Secondary | ICD-10-CM | POA: Diagnosis not present

## 2017-08-17 DIAGNOSIS — Z96651 Presence of right artificial knee joint: Secondary | ICD-10-CM | POA: Diagnosis not present

## 2017-08-17 DIAGNOSIS — Z95810 Presence of automatic (implantable) cardiac defibrillator: Secondary | ICD-10-CM | POA: Diagnosis not present

## 2017-08-17 DIAGNOSIS — I481 Persistent atrial fibrillation: Secondary | ICD-10-CM | POA: Diagnosis not present

## 2017-08-17 DIAGNOSIS — Z7901 Long term (current) use of anticoagulants: Secondary | ICD-10-CM | POA: Diagnosis not present

## 2017-08-17 DIAGNOSIS — I5042 Chronic combined systolic (congestive) and diastolic (congestive) heart failure: Secondary | ICD-10-CM | POA: Diagnosis not present

## 2017-08-17 DIAGNOSIS — M1711 Unilateral primary osteoarthritis, right knee: Secondary | ICD-10-CM | POA: Diagnosis not present

## 2017-08-17 DIAGNOSIS — Z8546 Personal history of malignant neoplasm of prostate: Secondary | ICD-10-CM | POA: Diagnosis not present

## 2017-08-17 DIAGNOSIS — I2510 Atherosclerotic heart disease of native coronary artery without angina pectoris: Secondary | ICD-10-CM | POA: Diagnosis not present

## 2017-08-17 DIAGNOSIS — M21161 Varus deformity, not elsewhere classified, right knee: Secondary | ICD-10-CM | POA: Diagnosis not present

## 2017-08-17 LAB — BASIC METABOLIC PANEL
Anion gap: 7 (ref 5–15)
BUN: 11 mg/dL (ref 8–23)
CO2: 23 mmol/L (ref 22–32)
Calcium: 8.5 mg/dL — ABNORMAL LOW (ref 8.9–10.3)
Chloride: 104 mmol/L (ref 98–111)
Creatinine, Ser: 0.97 mg/dL (ref 0.61–1.24)
GFR calc Af Amer: >60 mL/min (ref >60)
GFR calc non Af Amer: >60 mL/min (ref >60)
Glucose, Bld: 148 mg/dL — ABNORMAL HIGH (ref 70–99)
Potassium: 3.6 mmol/L (ref 3.5–5.1)
Sodium: 134 mmol/L — ABNORMAL LOW (ref 135–145)

## 2017-08-17 LAB — CBC
HCT: 35.1 % — ABNORMAL LOW (ref 39.0–52.0)
Hemoglobin: 11.8 g/dL — ABNORMAL LOW (ref 13.0–17.0)
MCH: 31.6 pg (ref 26.0–34.0)
MCHC: 33.6 g/dL (ref 30.0–36.0)
MCV: 93.9 fL (ref 78.0–100.0)
Platelets: 158 10*3/uL (ref 150–400)
RBC: 3.74 MIL/uL — ABNORMAL LOW (ref 4.22–5.81)
RDW: 12.7 % (ref 11.5–15.5)
WBC: 10.1 10*3/uL (ref 4.0–10.5)

## 2017-08-17 NOTE — Progress Notes (Signed)
AVS given and reviewed with pt and pt's wife. Printed prescription provided. All questions answered to satisfaction. Pt to be escorted off the unit via wheelchair by staff member.

## 2017-08-17 NOTE — Progress Notes (Signed)
Subjective: 1 Day Post-Op Procedure(s) (LRB): RIGHT TOTAL KNEE ARTHROPLASTY (Right) Patient reports pain as mild and moderate.    Objective: Vital signs in last 24 hours: Temp:  [98.3 F (36.8 C)-98.7 F (37.1 C)] 98.3 F (36.8 C) (07/06 0611) Pulse Rate:  [68-71] 69 (07/06 0855) Resp:  [16-17] 17 (07/06 0100) BP: (106-138)/(83-90) 138/90 (07/06 0855) SpO2:  [100 %] 100 % (07/06 0611)  Intake/Output from previous day: 07/05 0701 - 07/06 0700 In: 2394.5 [P.O.:540; I.V.:1804.5; IV Piggyback:50] Out: 1450 [Urine:900; Blood:550] Intake/Output this shift: No intake/output data recorded.  Recent Labs    08/17/17 0709  HGB 11.8*   Recent Labs    08/17/17 0709  WBC 10.1  RBC 3.74*  HCT 35.1*  PLT 158   Recent Labs    08/17/17 0709  NA 134*  K 3.6  CL 104  CO2 23  BUN 11  CREATININE 0.97  GLUCOSE 148*  CALCIUM 8.5*   Recent Labs    08/16/17 0619  INR 1.11    Neurovascular intact Sensation intact distally Intact pulses distally Dorsiflexion/Plantar flexion intact Incision: dressing C/D/I Compartment soft  Anticipated LOS equal to or greater than 2 midnights due to - Age 69 and older with one or more of the following:  - Obesity  - Expected need for hospital services (PT, OT, Nursing) required for safe  discharge  - Anticipated need for postoperative skilled nursing care or inpatient rehab  - Active co-morbidities: Chronic pain requiring opiods, Coronary Artery Disease, Heart Failure and Cardiac Arrhythmia OR   - Unanticipated findings during/Post Surgery: None  - Patient is a high risk of re-admission due to: None   Assessment/Plan: 1 Day Post-Op Procedure(s) (LRB): RIGHT TOTAL KNEE ARTHROPLASTY (Right) Up with therapy Discharge home with home health  wbat rle Pain control as ordered On xarelto at baseline  D/c today after PT session this afternoon    Chriss Czar 08/17/2017, 12:22 PM

## 2017-08-17 NOTE — Progress Notes (Signed)
Physical Therapy Treatment Patient Details Name: Jon Williams MRN: [id number] DOB: 01/07/1951 Today's Date: 08/17/2017    History of Present Illness Pt is a 67 y/o male s/p R TKA. PMH including but not limited to a-fib, CAD, AICD in 2018.    PT Comments    Pt up in chair on arrival with family present in room and RN giving morning meds. Pt required assist to perform supine HEP secondary to increased pain and weakness. Pt ambulated in hall with antalgic gait and cues for increased WB through R LE. Plan for stairs in PM.    Follow Up Recommendations  Home health PT     Equipment Recommendations  None recommended by PT;Other (comment)(pt reported someone delivered a RW to his house)    Recommendations for Other Services       Precautions / Restrictions Precautions Precautions: Fall;Knee Precaution Booklet Issued: Yes (comment) Precaution Comments: Reviewed knee positioning and LE HEP Restrictions Weight Bearing Restrictions: Yes RLE Weight Bearing: Weight bearing as tolerated    Mobility  Bed Mobility Overal bed mobility: Needs Assistance Bed Mobility: Supine to Sit     Supine to sit: Min assist     General bed mobility comments: in chair on arrival  Transfers Overall transfer level: Needs assistance Equipment used: Rolling walker (2 wheeled) Transfers: Sit to/from Stand Sit to Stand: Min assist         General transfer comment: min A to steady during transfer. Cues for hand placement.  Ambulation/Gait Ambulation/Gait assistance: Min guard Gait Distance (Feet): 175 Feet Assistive device: Rolling walker (2 wheeled) Gait Pattern/deviations: Step-to pattern;Decreased step length - right;Decreased step length - left;Decreased stride length;Decreased stance time - right;Decreased weight shift to right;Trunk flexed Gait velocity: decreased   General Gait Details: Pt with antalgic gait pattern secondary to post op pain. Cues for increased WB on R LE and equal step  length. Min guard for safety.   Stairs             Wheelchair Mobility    Modified Rankin (Stroke Patients Only)       Balance Overall balance assessment: Needs assistance Sitting-balance support: Feet supported Sitting balance-Leahy Scale: Fair     Standing balance support: During functional activity;Bilateral upper extremity supported Standing balance-Leahy Scale: Poor Standing balance comment: Heavy reliance on RW                            Cognition Arousal/Alertness: Awake/alert Behavior During Therapy: WFL for tasks assessed/performed Overall Cognitive Status: Within Functional Limits for tasks assessed                                        Exercises Total Joint Exercises Short Arc QuadSinclair Ship;Right;10 reps;Seated Hip ABduction/ADduction: AAROM;Right;10 reps;Seated Straight Leg Raises: AAROM;Right;10 reps;Seated    General Comments General comments (skin integrity, edema, etc.): wife and daughter present during session      Pertinent Vitals/Pain Pain Assessment: Faces Pain Score: 5  Faces Pain Scale: Hurts even more Pain Location: R knee Pain Descriptors / Indicators: Sore Pain Intervention(s): Monitored during session;Limited activity within patient's tolerance;Repositioned    Home Living Family/patient expects to be discharged to:: Private residence Living Arrangements: Spouse/significant other Available Help at Discharge: Family;Available 24 hours/day Type of Home: House Home Access: Stairs to enter Entrance Stairs-Rails: Right;Left Home Layout: One level Home Equipment: Environmental consultant -  2 wheels;Shower seat;Hand held shower head;Other (comment)(CPM)      Prior Function Level of Independence: Independent      Comments: Very active   PT Goals (current goals can now be found in the care plan section) Acute Rehab PT Goals Patient Stated Goal: decrease pain, return home PT Goal Formulation: With patient/family Time For  Goal Achievement: 08/30/17 Potential to Achieve Goals: Good Progress towards PT goals: Progressing toward goals    Frequency    7X/week      PT Plan Current plan remains appropriate    Co-evaluation              AM-PAC PT "6 Clicks" Daily Activity  Outcome Measure  Difficulty turning over in bed (including adjusting bedclothes, sheets and blankets)?: None Difficulty moving from lying on back to sitting on the side of the bed? : None Difficulty sitting down on and standing up from a chair with arms (e.g., wheelchair, bedside commode, etc,.)?: Unable Help needed moving to and from a bed to chair (including a wheelchair)?: A Little Help needed walking in hospital room?: A Little Help needed climbing 3-5 steps with a railing? : A Little 6 Click Score: 18    End of Session Equipment Utilized During Treatment: Gait belt Activity Tolerance: Patient tolerated treatment well;Patient limited by pain Patient left: in chair;with call bell/phone within reach;with family/visitor present Nurse Communication: Mobility status PT Visit Diagnosis: Other abnormalities of gait and mobility (R26.89);Pain Pain - Right/Left: Right Pain - part of body: Knee     Time: 4081-4481 PT Time Calculation (min) (ACUTE ONLY): 31 min  Charges:  $Gait Training: 8-22 mins $Therapeutic Exercise: 8-22 mins                    G Codes:       Benjiman Core, [place] Pager [phone] Acute Rehab   Allena Katz 08/17/2017, 9:36 AM

## 2017-08-17 NOTE — Progress Notes (Signed)
Physical Therapy Treatment Patient Details Name: Jon Williams MRN: [id number] DOB: [birth year] Today's Date: 08/17/2017    History of Present Illness Pt is a 67 y/o male s/p R TKA. PMH including but not limited to a-fib, CAD, AICD in 2018.    PT Comments    Pt is progressing well towards goals. He tolerated increased ambulation distance this session and stair training. Current plan remains appropriate. Pt expected to d/c this PM.    Follow Up Recommendations  Home health PT     Equipment Recommendations  None recommended by PT;Other (comment)(pt reported someone delivered a RW to his house)    Recommendations for Other Services       Precautions / Restrictions Precautions Precautions: Fall;Knee Precaution Booklet Issued: Yes (comment) Precaution Comments: Reviewed knee positioning and LE HEP Restrictions Weight Bearing Restrictions: Yes RLE Weight Bearing: Weight bearing as tolerated    Mobility  Bed Mobility               General bed mobility comments: in chair on arrival  Transfers Overall transfer level: Needs assistance Equipment used: Rolling walker (2 wheeled) Transfers: Sit to/from Stand Sit to Stand: Min guard         General transfer comment: Cues for hand placement and for controlled descent.  Ambulation/Gait Ambulation/Gait assistance: Min guard Gait Distance (Feet): 300 Feet Assistive device: Rolling walker (2 wheeled) Gait Pattern/deviations: Decreased step length - right;Decreased step length - left;Decreased stride length;Decreased stance time - right;Decreased weight shift to right;Trunk flexed;Step-through pattern;Step-to pattern Gait velocity: decreased   General Gait Details: Pt initially with step to pattern. Able to progress to step through pattern with cues for increased heel strike and equal step length. Antalgic gait, but overall steady with RW   Stairs             Wheelchair Mobility    Modified Rankin (Stroke  Patients Only)       Balance Overall balance assessment: Needs assistance Sitting-balance support: Feet supported Sitting balance-Leahy Scale: Fair     Standing balance support: During functional activity;Bilateral upper extremity supported Standing balance-Leahy Scale: Poor Standing balance comment: Reliant on RW for support                            Cognition Arousal/Alertness: Awake/alert Behavior During Therapy: WFL for tasks assessed/performed Overall Cognitive Status: Within Functional Limits for tasks assessed                                        Exercises Total Joint Exercises Heel Slides: AROM;Right;10 reps;Seated Long Arc Quad: AAROM;Right;10 reps;Seated(AAROM for concentric, pt controlling eccentric contraction) Knee Flexion: AROM;Right;5 reps;Seated    General Comments General comments (skin integrity, edema, etc.): multiple family members present.      Pertinent Vitals/Pain Pain Assessment: 0-10 Pain Score: 4  Pain Location: R knee Pain Descriptors / Indicators: Sore Pain Intervention(s): Monitored during session;Limited activity within patient's tolerance    Home Living                      Prior Function            PT Goals (current goals can now be found in the care plan section) Acute Rehab PT Goals Patient Stated Goal: decrease pain, return home PT Goal Formulation: With patient/family Time For Goal Achievement: 08/30/17 Potential  to Achieve Goals: Good Progress towards PT goals: Progressing toward goals    Frequency    7X/week      PT Plan Current plan remains appropriate    Co-evaluation              AM-PAC PT "6 Clicks" Daily Activity  Outcome Measure  Difficulty turning over in bed (including adjusting bedclothes, sheets and blankets)?: None Difficulty moving from lying on back to sitting on the side of the bed? : None Difficulty sitting down on and standing up from a chair with  arms (e.g., wheelchair, bedside commode, etc,.)?: Unable Help needed moving to and from a bed to chair (including a wheelchair)?: A Little Help needed walking in hospital room?: A Little Help needed climbing 3-5 steps with a railing? : A Little 6 Click Score: 18    End of Session Equipment Utilized During Treatment: Gait belt Activity Tolerance: Patient tolerated treatment well Patient left: in chair;with call bell/phone within reach;with family/visitor present Nurse Communication: Mobility status PT Visit Diagnosis: Other abnormalities of gait and mobility (R26.89);Pain Pain - Right/Left: Right Pain - part of body: Knee     Time: 2863-8177 PT Time Calculation (min) (ACUTE ONLY): 30 min  Charges:  $Gait Training: 8-22 mins $Therapeutic Exercise: 8-22 mins                    G Codes:      Benjiman Core, [place] Pager [phone] Acute Rehab   Allena Katz 08/17/2017, 1:39 PM

## 2017-08-17 NOTE — Discharge Summary (Addendum)
PATIENT ID: Jon Williams        MRN:  [id number]          DOB/AGE: 1950/06/01 / 67 y.o.    DISCHARGE SUMMARY  ADMISSION DATE:    08/16/2017 DISCHARGE DATE:   08/17/2017   ADMISSION DIAGNOSIS: OA RIGHT KNEE    DISCHARGE DIAGNOSIS:  OA RIGHT KNEE    ADDITIONAL DIAGNOSIS: Active Problems:   S/P knee replacement   S/P total knee arthroplasty  Past Medical History:  Diagnosis Date  . AICD (automatic cardioverter/defibrillator) present 05/25/2016   biv icd  . Arthritis    knee  . Chronic combined systolic and diastolic CHF (congestive heart failure) ([place])    Echo 1/18: Mild conc LVH, EF 15-20, severe diff HK, inf and inf-septal AK, Gr 3 DD, mild to mod MR, severe LAE, mod reduced RVSF, mod RAE, mild TR, PASP 50  . Coronary artery disease involving native coronary artery without angina pectoris 04/17/2016   LHC 1/18: pLCx 30, mLCx 20, mRCA 40, dRCA 20, LVEDP 23, mean RA 8, PA 42/20, PCWP 17  . LBBB (left bundle branch block)   . NICM (nonischemic cardiomyopathy) ([place])    Echo 1/18:  Mild conc LVH, EF 15-20, severe diff HK, inf and inf-septal AK, Gr 3 DD, mild to mod MR, severe LAE, mod reduced RVSF, mod RAE, mild TR, PASP 50  . Other secondary pulmonary hypertension ([place]) 04/17/2016  . Prostate cancer ([place]) 2019    PROCEDURE: Procedure(s): RIGHT TOTAL KNEE ARTHROPLASTY Right on 08/16/2017  CONSULTS: PT/OT    HISTORY:  See H&P in chart  HOSPITAL COURSE:  Jon Williams is a 67 y.o. admitted on 08/16/2017 and found to have a diagnosis of OA RIGHT KNEE.  After appropriate laboratory studies were obtained  they were taken to the operating room on 08/16/2017 and underwent  Procedure(s): RIGHT TOTAL KNEE ARTHROPLASTY  Right.   They were given perioperative antibiotics:  Anti-infectives (From admission, onward)   Start     Dose/Rate Route Frequency Ordered Stop   08/16/17 1400  ceFAZolin (ANCEF) IVPB 1 g/50 mL premix     1 g 100 mL/hr over 30 Minutes Intravenous Every 6 hours 08/16/17  1147 08/16/17 2115   08/16/17 0630  ceFAZolin (ANCEF) IVPB 2g/100 mL premix     2 g 200 mL/hr over 30 Minutes Intravenous To ShortStay Surgical 08/14/17 0744 08/16/17 0805    .  Tolerated the procedure well.  Straight cath post op.  POD #1, allowed out of bed to a chair.  PT for ambulation and exercise program.   IV saline locked.  O2 discontionued.  The remainder of the hospital course was dedicated to ambulation and strengthening.   The patient was discharged on 1 Day Post-Op in  Stable condition.  Blood products given:none  DIAGNOSTIC STUDIES: Recent vital signs:  Patient Vitals for the past 24 hrs:  BP Temp Temp src Pulse Resp SpO2  08/17/17 0855 138/90 - - 69 - -  08/17/17 0611 106/83 98.3 F (36.8 C) Oral 68 - 100 %  08/17/17 0100 135/88 98.7 F (37.1 C) Oral 71 17 100 %  08/16/17 2111 127/87 98.4 F (36.9 C) Oral 70 16 100 %       Recent laboratory studies: Recent Labs    08/17/17 0709  WBC 10.1  HGB 11.8*  HCT 35.1*  PLT 158   Recent Labs    08/17/17 0709  NA 134*  K 3.6  CL 104  CO2  23  BUN 11  CREATININE 0.97  GLUCOSE 148*  CALCIUM 8.5*   Lab Results  Component Value Date   INR 1.11 08/16/2017   INR 1.49 08/05/2017   INR 1.0 02/24/2016     Recent Radiographic Studies :  Dg Chest 2 View  Result Date: 08/05/2017 CLINICAL DATA:  Osteoarthritis of the right knee, preop. EXAM: CHEST - 2 VIEW COMPARISON:  06/07/2016 FINDINGS: The heart size and mediastinal contours are within normal limits. Left-sided ICD device with leads in the coronary sinus, right atrium and right ventricle. Both lungs are clear. The visualized skeletal structures are unremarkable. IMPRESSION: No active cardiopulmonary disease. Electronically Signed   By: Ashley Royalty M.D.   On: 08/05/2017 23:20    DISCHARGE INSTRUCTIONS:   DISCHARGE MEDICATIONS:   Allergies as of 08/17/2017      Reactions   Aspirin Anaphylaxis, Hives   Sulfa Antibiotics Anaphylaxis, Hives      Medication  List    TAKE these medications   atorvastatin 40 MG tablet Commonly known as:  LIPITOR Take 1 tablet (40 mg total) by mouth daily. What changed:  when to take this   carvedilol 3.125 MG tablet Commonly known as:  COREG Take 1 tablet (3.125 mg total) by mouth 2 (two) times daily.   cholecalciferol 1000 units tablet Commonly known as:  VITAMIN D Take 1,000 Units by mouth daily.   furosemide 40 MG tablet Commonly known as:  LASIX TAKE 1 TABLET BY MOUTH TWICE A DAY   multivitamin with minerals Tabs tablet Take 1 tablet by mouth daily. One-A-Day   oxyCODONE-acetaminophen 7.5-325 MG tablet Commonly known as:  PERCOCET Take 1 tablet by mouth every 4 (four) hours as needed for severe pain. What changed:  when to take this   potassium chloride 10 MEQ tablet Commonly known as:  K-DUR Take 10 mEq by mouth 2 (two) times daily.   rivaroxaban 20 MG Tabs tablet Commonly known as:  XARELTO Take 1 tablet (20 mg total) by mouth daily with supper.   sacubitril-valsartan 24-26 MG Commonly known as:  ENTRESTO Take 1 tablet by mouth 2 (two) times daily.   sotalol 80 MG tablet Commonly known as:  BETAPACE TAKE 1 AND 1/2 TABLET BY MOUTH TWICE A DAY            Durable Medical Equipment  (From admission, onward)        Start     Ordered   08/16/17 1148  DME Walker rolling  Once    Question:  Patient needs a walker to treat with the following condition  Answer:  Primary localized osteoarthritis of right knee   08/16/17 1147   08/16/17 1148  DME 3 n 1  Once     08/16/17 1147      FOLLOW UP VISIT:   Follow-up Information    Jon Server, MD. Schedule an appointment as soon as possible for a visit on 09/03/2017.   Specialty:  Orthopedic Surgery Contact information: [place] [address] [phone]        Care, [facility] Follow up.   Specialty:  [place] Why:  A representative from [facility] will contact you to  arrange start date and time for your therapy. Contact information: [place] [address] [phone]           DISPOSITION:   Home  CONDITION:  Stable   Chriss Czar, PA-C  08/17/2017 12:29 PM

## 2017-08-17 NOTE — Care Management (Signed)
Patient and wife have decided that he will need 3in1 for home. CM has requested through [facility].

## 2017-08-17 NOTE — Evaluation (Signed)
Occupational Therapy Evaluation Patient Details Name: Jon Williams MRN: [id number] DOB: 1950/04/25 Today's Date: 08/17/2017    History of Present Illness Pt is a 67 y/o male s/p R TKA. PMH including but not limited to a-fib, CAD, AICD in 2018.   Clinical Impression   PTA, pt was independent with ADL and functional mobility and maintaining an active lifestyle. He is currently limited by pain and decreased activity tolerance for ADL. Pt requiring min assist for toilet transfers, mod assist for LB ADL, and min guard assist for standing grooming tasks. He is moving slowly and requiring increased effort for all tasks. He would benefit from continued OT services while admitted to improve independence and safety with ADL and functional mobility. Recommended pt wait to perform shower transfers until assisted by HHPT and he reports understanding. OT will continue to follow while admitted.     Follow Up Recommendations  No OT follow up;Supervision/Assistance - 24 hour    Equipment Recommendations  3 in 1 bedside commode    Recommendations for Other Services       Precautions / Restrictions Precautions Precautions: Fall;Knee Precaution Booklet Issued: Yes (comment) Precaution Comments: Reviewed knee positioning and LE HEP Restrictions Weight Bearing Restrictions: Yes RLE Weight Bearing: Weight bearing as tolerated      Mobility Bed Mobility Overal bed mobility: Needs Assistance Bed Mobility: Supine to Sit     Supine to sit: Min assist     General bed mobility comments: in chair on arrival  Transfers Overall transfer level: Needs assistance Equipment used: Rolling walker (2 wheeled) Transfers: Sit to/from Stand Sit to Stand: Min assist         General transfer comment: min A to steady during transfer. Cues for hand placement.    Balance Overall balance assessment: Needs assistance Sitting-balance support: Feet supported Sitting balance-Leahy Scale: Fair     Standing  balance support: During functional activity;Bilateral upper extremity supported Standing balance-Leahy Scale: Poor Standing balance comment: Heavy reliance on RW                           ADL either performed or assessed with clinical judgement   ADL Overall ADL's : Needs assistance/impaired Eating/Feeding: Set up;Sitting   Grooming: Min guard;Standing   Upper Body Bathing: Set up;Sitting   Lower Body Bathing: Moderate assistance;Sit to/from stand   Upper Body Dressing : Set up;Sitting   Lower Body Dressing: Moderate assistance;Sit to/from stand   Toilet Transfer: Minimal assistance;Ambulation;RW;Regular Toilet;Grab bars Toilet Transfer Details (indicate cue type and reason): cues for hand and RLE placement Toileting- Clothing Manipulation and Hygiene: Minimal assistance;Sit to/from stand       Functional mobility during ADLs: Minimal assistance;Rolling walker General ADL Comments: Pt and family educated concerning post-operative ADL progression.      Vision Patient Visual Report: No change from baseline Vision Assessment?: No apparent visual deficits     Perception     Praxis      Pertinent Vitals/Pain Pain Assessment: Faces Pain Score: 5  Faces Pain Scale: Hurts even more Pain Location: R knee Pain Descriptors / Indicators: Sore Pain Intervention(s): Monitored during session;Limited activity within patient's tolerance;Repositioned     Hand Dominance     Extremity/Trunk Assessment Upper Extremity Assessment Upper Extremity Assessment: Overall WFL for tasks assessed   Lower Extremity Assessment Lower Extremity Assessment: RLE deficits/detail RLE Deficits / Details: Decreased strength and ROM as expected post-operatively   Cervical / Trunk Assessment Cervical / Trunk Assessment:  Normal   Communication Communication Communication: No difficulties   Cognition Arousal/Alertness: Awake/alert Behavior During Therapy: WFL for tasks  assessed/performed Overall Cognitive Status: Within Functional Limits for tasks assessed                                     General Comments  wife and daughter present during session    Exercises Exercises: Total Joint Total Joint Exercises Short Arc Quad: AAROM;Right;10 reps;Seated Hip ABduction/ADduction: AAROM;Right;10 reps;Seated Straight Leg Raises: AAROM;Right;10 reps;Seated   Shoulder Instructions      Home Living Family/patient expects to be discharged to:: Private residence Living Arrangements: Spouse/significant other Available Help at Discharge: Family;Available 24 hours/day Type of Home: House Home Access: Stairs to enter [employer] of Steps: 3 Entrance Stairs-Rails: Right;Left Home Layout: One level     Bathroom Shower/Tub: Occupational psychologist: Standard     Home Equipment: Environmental consultant - 2 wheels;Shower seat;Hand held shower head;Other (comment)(CPM)          Prior Functioning/Environment Level of Independence: Independent        Comments: Very active        OT Problem List: Decreased strength;Decreased range of motion;Decreased activity tolerance;Impaired balance (sitting and/or standing);Decreased safety awareness;Decreased knowledge of use of DME or AE;Decreased knowledge of precautions;Pain      OT Treatment/Interventions: Self-care/ADL training;Therapeutic exercise;Energy conservation;DME and/or AE instruction;Therapeutic activities;Patient/family education;Balance training    OT Goals(Current goals can be found in the care plan section) Acute Rehab OT Goals Patient Stated Goal: decrease pain, return home OT Goal Formulation: With patient Time For Goal Achievement: 08/31/17 Potential to Achieve Goals: Good ADL Goals Pt Will Perform Grooming: with modified independence;sitting Pt Will Perform Lower Body Dressing: with modified independence;sit to/from stand Pt Will Transfer to Toilet: with modified  independence;ambulating;bedside commode;regular height toilet(BSC over toilet) Pt Will Perform Toileting - Clothing Manipulation and hygiene: with modified independence;sit to/from stand Pt Will Perform Tub/Shower Transfer: with modified independence;ambulating;shower seat;rolling walker;Shower transfer  OT Frequency: Min 2X/week   Barriers to D/C:            Co-evaluation              AM-PAC PT "6 Clicks" Daily Activity     Outcome Measure Help from another person eating meals?: None Help from another person taking care of personal grooming?: A Little Help from another person toileting, which includes using toliet, bedpan, or urinal?: A Little Help from another person bathing (including washing, rinsing, drying)?: A Little Help from another person to put on and taking off regular upper body clothing?: None Help from another person to put on and taking off regular lower body clothing?: A Little 6 Click Score: 20   End of Session Equipment Utilized During Treatment: Gait belt;Rolling walker;Right knee immobilizer CPM Right Knee CPM Right Knee: Off Right Knee Flexion (Degrees): 90 Right Knee Extension (Degrees): 0 Additional Comments: foot roll  Activity Tolerance: Patient tolerated treatment well Patient left: in chair;with call bell/phone within reach;with family/visitor present  OT Visit Diagnosis: Other abnormalities of gait and mobility (R26.89);Pain Pain - Right/Left: Right Pain - part of body: Knee                Time: 3570-1779 OT Time Calculation (min): 34 min Charges:  OT General Charges $OT Visit: 1 Visit OT Evaluation $OT Eval Moderate Complexity: 1 Mod OT Treatments $Self Care/Home Management : 8-22 mins G-Codes:  Norman Herrlich, MS OTR/L  Pager: Burke A Tafari [place] 08/17/2017, 9:44 AM

## 2017-08-19 ENCOUNTER — Encounter (HOSPITAL_COMMUNITY): Payer: Self-pay | Admitting: Orthopedic Surgery

## 2017-08-19 DIAGNOSIS — Z96651 Presence of right artificial knee joint: Secondary | ICD-10-CM | POA: Diagnosis not present

## 2017-08-19 DIAGNOSIS — Z471 Aftercare following joint replacement surgery: Secondary | ICD-10-CM | POA: Diagnosis not present

## 2017-08-19 DIAGNOSIS — G8918 Other acute postprocedural pain: Secondary | ICD-10-CM | POA: Diagnosis not present

## 2017-08-19 DIAGNOSIS — I2729 Other secondary pulmonary hypertension: Secondary | ICD-10-CM | POA: Diagnosis not present

## 2017-08-19 DIAGNOSIS — Z950 Presence of cardiac pacemaker: Secondary | ICD-10-CM | POA: Diagnosis not present

## 2017-08-19 DIAGNOSIS — I5042 Chronic combined systolic (congestive) and diastolic (congestive) heart failure: Secondary | ICD-10-CM | POA: Diagnosis not present

## 2017-08-19 DIAGNOSIS — I482 Chronic atrial fibrillation: Secondary | ICD-10-CM | POA: Diagnosis not present

## 2017-08-19 DIAGNOSIS — I2510 Atherosclerotic heart disease of native coronary artery without angina pectoris: Secondary | ICD-10-CM | POA: Diagnosis not present

## 2017-08-19 NOTE — Anesthesia Postprocedure Evaluation (Signed)
Anesthesia Post Note  Patient: Jon Williams  Procedure(s) Performed: RIGHT TOTAL KNEE ARTHROPLASTY (Right Knee)     Patient location during evaluation: PACU Anesthesia Type: Spinal Level of consciousness: awake and alert Pain management: pain level controlled Vital Signs Assessment: post-procedure vital signs reviewed and stable Respiratory status: spontaneous breathing Cardiovascular status: stable Postop Assessment: spinal receding Anesthetic complications: no    Last Vitals:  Vitals:   08/17/17 0611 08/17/17 0855  BP: 106/83 138/90  Pulse: 68 69  Resp:    Temp: 36.8 C   SpO2: 100%     Last Pain:  Vitals:   08/17/17 1505  TempSrc:   PainSc: [place]

## 2017-08-20 DIAGNOSIS — M1711 Unilateral primary osteoarthritis, right knee: Secondary | ICD-10-CM | POA: Diagnosis not present

## 2017-08-21 DIAGNOSIS — G8918 Other acute postprocedural pain: Secondary | ICD-10-CM | POA: Diagnosis not present

## 2017-08-21 DIAGNOSIS — I2729 Other secondary pulmonary hypertension: Secondary | ICD-10-CM | POA: Diagnosis not present

## 2017-08-21 DIAGNOSIS — Z950 Presence of cardiac pacemaker: Secondary | ICD-10-CM | POA: Diagnosis not present

## 2017-08-21 DIAGNOSIS — I2510 Atherosclerotic heart disease of native coronary artery without angina pectoris: Secondary | ICD-10-CM | POA: Diagnosis not present

## 2017-08-21 DIAGNOSIS — Z96651 Presence of right artificial knee joint: Secondary | ICD-10-CM | POA: Diagnosis not present

## 2017-08-21 DIAGNOSIS — I5042 Chronic combined systolic (congestive) and diastolic (congestive) heart failure: Secondary | ICD-10-CM | POA: Diagnosis not present

## 2017-08-21 DIAGNOSIS — Z471 Aftercare following joint replacement surgery: Secondary | ICD-10-CM | POA: Diagnosis not present

## 2017-08-21 DIAGNOSIS — I482 Chronic atrial fibrillation: Secondary | ICD-10-CM | POA: Diagnosis not present

## 2017-08-22 DIAGNOSIS — Z96651 Presence of right artificial knee joint: Secondary | ICD-10-CM | POA: Diagnosis not present

## 2017-08-22 DIAGNOSIS — M1711 Unilateral primary osteoarthritis, right knee: Secondary | ICD-10-CM | POA: Diagnosis not present

## 2017-08-23 DIAGNOSIS — I2510 Atherosclerotic heart disease of native coronary artery without angina pectoris: Secondary | ICD-10-CM | POA: Diagnosis not present

## 2017-08-23 DIAGNOSIS — Z471 Aftercare following joint replacement surgery: Secondary | ICD-10-CM | POA: Diagnosis not present

## 2017-08-23 DIAGNOSIS — I5042 Chronic combined systolic (congestive) and diastolic (congestive) heart failure: Secondary | ICD-10-CM | POA: Diagnosis not present

## 2017-08-23 DIAGNOSIS — I2729 Other secondary pulmonary hypertension: Secondary | ICD-10-CM | POA: Diagnosis not present

## 2017-08-23 DIAGNOSIS — Z950 Presence of cardiac pacemaker: Secondary | ICD-10-CM | POA: Diagnosis not present

## 2017-08-23 DIAGNOSIS — I482 Chronic atrial fibrillation: Secondary | ICD-10-CM | POA: Diagnosis not present

## 2017-08-23 DIAGNOSIS — Z96651 Presence of right artificial knee joint: Secondary | ICD-10-CM | POA: Diagnosis not present

## 2017-08-23 DIAGNOSIS — G8918 Other acute postprocedural pain: Secondary | ICD-10-CM | POA: Diagnosis not present

## 2017-08-26 ENCOUNTER — Other Ambulatory Visit: Payer: Self-pay | Admitting: Internal Medicine

## 2017-08-26 DIAGNOSIS — I2729 Other secondary pulmonary hypertension: Secondary | ICD-10-CM | POA: Diagnosis not present

## 2017-08-26 DIAGNOSIS — G8918 Other acute postprocedural pain: Secondary | ICD-10-CM | POA: Diagnosis not present

## 2017-08-26 DIAGNOSIS — I2510 Atherosclerotic heart disease of native coronary artery without angina pectoris: Secondary | ICD-10-CM | POA: Diagnosis not present

## 2017-08-26 DIAGNOSIS — Z471 Aftercare following joint replacement surgery: Secondary | ICD-10-CM | POA: Diagnosis not present

## 2017-08-26 DIAGNOSIS — I482 Chronic atrial fibrillation: Secondary | ICD-10-CM | POA: Diagnosis not present

## 2017-08-26 DIAGNOSIS — Z950 Presence of cardiac pacemaker: Secondary | ICD-10-CM | POA: Diagnosis not present

## 2017-08-26 DIAGNOSIS — Z96651 Presence of right artificial knee joint: Secondary | ICD-10-CM | POA: Diagnosis not present

## 2017-08-26 DIAGNOSIS — I5042 Chronic combined systolic (congestive) and diastolic (congestive) heart failure: Secondary | ICD-10-CM | POA: Diagnosis not present

## 2017-08-26 MED ORDER — POTASSIUM CHLORIDE ER 10 MEQ PO TBCR: 10.0000 meq | EXTENDED_RELEASE_TABLET | Freq: Two times a day (BID) | ORAL | 3 refills | Status: DC

## 2017-08-26 NOTE — Telephone Encounter (Signed)
Pt's medication was sent to pt's pharmacy as requested. Confirmation received.  °

## 2017-08-27 DIAGNOSIS — M1711 Unilateral primary osteoarthritis, right knee: Secondary | ICD-10-CM | POA: Diagnosis not present

## 2017-08-27 MED FILL — HYDROmorphone HCL 2 MG TABS: 2 | 4 days supply | Qty: 25 | Fill #0

## 2017-08-28 DIAGNOSIS — I5042 Chronic combined systolic (congestive) and diastolic (congestive) heart failure: Secondary | ICD-10-CM | POA: Diagnosis not present

## 2017-08-28 DIAGNOSIS — Z950 Presence of cardiac pacemaker: Secondary | ICD-10-CM | POA: Diagnosis not present

## 2017-08-28 DIAGNOSIS — Z96651 Presence of right artificial knee joint: Secondary | ICD-10-CM | POA: Diagnosis not present

## 2017-08-28 DIAGNOSIS — I2510 Atherosclerotic heart disease of native coronary artery without angina pectoris: Secondary | ICD-10-CM | POA: Diagnosis not present

## 2017-08-28 DIAGNOSIS — I482 Chronic atrial fibrillation: Secondary | ICD-10-CM | POA: Diagnosis not present

## 2017-08-28 DIAGNOSIS — G8918 Other acute postprocedural pain: Secondary | ICD-10-CM | POA: Diagnosis not present

## 2017-08-28 DIAGNOSIS — Z471 Aftercare following joint replacement surgery: Secondary | ICD-10-CM | POA: Diagnosis not present

## 2017-08-28 DIAGNOSIS — I2729 Other secondary pulmonary hypertension: Secondary | ICD-10-CM | POA: Diagnosis not present

## 2017-08-29 DIAGNOSIS — M1711 Unilateral primary osteoarthritis, right knee: Secondary | ICD-10-CM | POA: Diagnosis not present

## 2017-08-29 DIAGNOSIS — M25661 Stiffness of right knee, not elsewhere classified: Secondary | ICD-10-CM | POA: Diagnosis not present

## 2017-08-29 DIAGNOSIS — R262 Difficulty in walking, not elsewhere classified: Secondary | ICD-10-CM | POA: Diagnosis not present

## 2017-08-29 DIAGNOSIS — M6281 Muscle weakness (generalized): Secondary | ICD-10-CM | POA: Diagnosis not present

## 2017-09-02 DIAGNOSIS — M6281 Muscle weakness (generalized): Secondary | ICD-10-CM | POA: Diagnosis not present

## 2017-09-02 DIAGNOSIS — M25461 Effusion, right knee: Secondary | ICD-10-CM | POA: Diagnosis not present

## 2017-09-02 DIAGNOSIS — R262 Difficulty in walking, not elsewhere classified: Secondary | ICD-10-CM | POA: Diagnosis not present

## 2017-09-02 DIAGNOSIS — M25661 Stiffness of right knee, not elsewhere classified: Secondary | ICD-10-CM | POA: Diagnosis not present

## 2017-09-02 DIAGNOSIS — M1711 Unilateral primary osteoarthritis, right knee: Secondary | ICD-10-CM | POA: Diagnosis not present

## 2017-09-04 DIAGNOSIS — R262 Difficulty in walking, not elsewhere classified: Secondary | ICD-10-CM | POA: Diagnosis not present

## 2017-09-04 DIAGNOSIS — M6281 Muscle weakness (generalized): Secondary | ICD-10-CM | POA: Diagnosis not present

## 2017-09-04 DIAGNOSIS — M25561 Pain in right knee: Secondary | ICD-10-CM | POA: Diagnosis not present

## 2017-09-04 DIAGNOSIS — M25661 Stiffness of right knee, not elsewhere classified: Secondary | ICD-10-CM | POA: Diagnosis not present

## 2017-09-06 DIAGNOSIS — M25661 Stiffness of right knee, not elsewhere classified: Secondary | ICD-10-CM | POA: Diagnosis not present

## 2017-09-06 DIAGNOSIS — M25561 Pain in right knee: Secondary | ICD-10-CM | POA: Diagnosis not present

## 2017-09-06 DIAGNOSIS — M6281 Muscle weakness (generalized): Secondary | ICD-10-CM | POA: Diagnosis not present

## 2017-09-06 DIAGNOSIS — R262 Difficulty in walking, not elsewhere classified: Secondary | ICD-10-CM | POA: Diagnosis not present

## 2017-09-09 DIAGNOSIS — R262 Difficulty in walking, not elsewhere classified: Secondary | ICD-10-CM | POA: Diagnosis not present

## 2017-09-09 DIAGNOSIS — M6281 Muscle weakness (generalized): Secondary | ICD-10-CM | POA: Diagnosis not present

## 2017-09-09 DIAGNOSIS — M25661 Stiffness of right knee, not elsewhere classified: Secondary | ICD-10-CM | POA: Diagnosis not present

## 2017-09-09 DIAGNOSIS — M25461 Effusion, right knee: Secondary | ICD-10-CM | POA: Diagnosis not present

## 2017-09-11 DIAGNOSIS — M6281 Muscle weakness (generalized): Secondary | ICD-10-CM | POA: Diagnosis not present

## 2017-09-11 DIAGNOSIS — R262 Difficulty in walking, not elsewhere classified: Secondary | ICD-10-CM | POA: Diagnosis not present

## 2017-09-11 DIAGNOSIS — M25561 Pain in right knee: Secondary | ICD-10-CM | POA: Diagnosis not present

## 2017-09-11 DIAGNOSIS — M25661 Stiffness of right knee, not elsewhere classified: Secondary | ICD-10-CM | POA: Diagnosis not present

## 2017-09-11 LAB — CUP PACEART REMOTE DEVICE CHECK
Battery Remaining Longevity: 66 mo
Battery Remaining Percentage: 79 %
Battery Voltage: 2.98 V
Brady Statistic AP VP Percent: 88 %
Brady Statistic AP VS Percent: 1 %
Brady Statistic AS VP Percent: 11 %
Brady Statistic AS VS Percent: 1 %
Brady Statistic RA Percent Paced: 11 %
Date Time Interrogation Session: 20190703200154
HighPow Impedance: 73 Ohm
HighPow Impedance: 73 Ohm
Implantable Lead Implant Date: 20180413
Implantable Lead Implant Date: 20180413
Implantable Lead Implant Date: 20180413
Implantable Lead Location: 753858
Implantable Lead Location: 753859
Implantable Lead Location: 753860
Implantable Lead Model: 7122
Implantable Pulse Generator Implant Date: 20180413
Lead Channel Impedance Value: 400 Ohm
Lead Channel Impedance Value: 480 Ohm
Lead Channel Impedance Value: 910 Ohm
Lead Channel Pacing Threshold Amplitude: 0.75 V
Lead Channel Pacing Threshold Amplitude: 0.75 V
Lead Channel Pacing Threshold Amplitude: 1 V
Lead Channel Pacing Threshold Pulse Width: 0.5 ms
Lead Channel Pacing Threshold Pulse Width: 0.5 ms
Lead Channel Pacing Threshold Pulse Width: 0.5 ms
Lead Channel Sensing Intrinsic Amplitude: 0.3 mV
Lead Channel Sensing Intrinsic Amplitude: 11.7 mV
Lead Channel Setting Pacing Amplitude: 2 V
Lead Channel Setting Pacing Amplitude: 2.5 V
Lead Channel Setting Pacing Amplitude: 2.5 V
Lead Channel Setting Pacing Pulse Width: 0.5 ms
Lead Channel Setting Pacing Pulse Width: 0.5 ms
Lead Channel Setting Sensing Sensitivity: 0.5 mV
Pulse Gen Serial Number: 7398151

## 2017-09-13 DIAGNOSIS — R262 Difficulty in walking, not elsewhere classified: Secondary | ICD-10-CM | POA: Diagnosis not present

## 2017-09-13 DIAGNOSIS — M6281 Muscle weakness (generalized): Secondary | ICD-10-CM | POA: Diagnosis not present

## 2017-09-13 DIAGNOSIS — M25661 Stiffness of right knee, not elsewhere classified: Secondary | ICD-10-CM | POA: Diagnosis not present

## 2017-09-13 DIAGNOSIS — M25561 Pain in right knee: Secondary | ICD-10-CM | POA: Diagnosis not present

## 2017-09-16 DIAGNOSIS — R262 Difficulty in walking, not elsewhere classified: Secondary | ICD-10-CM | POA: Diagnosis not present

## 2017-09-16 DIAGNOSIS — M25461 Effusion, right knee: Secondary | ICD-10-CM | POA: Diagnosis not present

## 2017-09-16 DIAGNOSIS — M25661 Stiffness of right knee, not elsewhere classified: Secondary | ICD-10-CM | POA: Diagnosis not present

## 2017-09-16 DIAGNOSIS — M6281 Muscle weakness (generalized): Secondary | ICD-10-CM | POA: Diagnosis not present

## 2017-09-17 ENCOUNTER — Encounter (HOSPITAL_BASED_OUTPATIENT_CLINIC_OR_DEPARTMENT_OTHER): Payer: Self-pay

## 2017-09-17 ENCOUNTER — Ambulatory Visit (HOSPITAL_BASED_OUTPATIENT_CLINIC_OR_DEPARTMENT_OTHER): Admit: 2017-09-17 | Payer: Medicare HMO | Admitting: Urology

## 2017-09-17 SURGERY — INSERTION, GOLD SEEDS
Anesthesia: General

## 2017-09-18 DIAGNOSIS — M6281 Muscle weakness (generalized): Secondary | ICD-10-CM | POA: Diagnosis not present

## 2017-09-18 DIAGNOSIS — R262 Difficulty in walking, not elsewhere classified: Secondary | ICD-10-CM | POA: Diagnosis not present

## 2017-09-18 DIAGNOSIS — M25561 Pain in right knee: Secondary | ICD-10-CM | POA: Diagnosis not present

## 2017-09-18 DIAGNOSIS — M25661 Stiffness of right knee, not elsewhere classified: Secondary | ICD-10-CM | POA: Diagnosis not present

## 2017-09-19 DIAGNOSIS — R262 Difficulty in walking, not elsewhere classified: Secondary | ICD-10-CM | POA: Diagnosis not present

## 2017-09-19 DIAGNOSIS — M25661 Stiffness of right knee, not elsewhere classified: Secondary | ICD-10-CM | POA: Diagnosis not present

## 2017-09-19 DIAGNOSIS — M25561 Pain in right knee: Secondary | ICD-10-CM | POA: Diagnosis not present

## 2017-09-19 DIAGNOSIS — M6281 Muscle weakness (generalized): Secondary | ICD-10-CM | POA: Diagnosis not present

## 2017-09-23 DIAGNOSIS — M25561 Pain in right knee: Secondary | ICD-10-CM | POA: Diagnosis not present

## 2017-09-24 DIAGNOSIS — M25561 Pain in right knee: Secondary | ICD-10-CM | POA: Diagnosis not present

## 2017-09-24 DIAGNOSIS — M6281 Muscle weakness (generalized): Secondary | ICD-10-CM | POA: Diagnosis not present

## 2017-09-24 DIAGNOSIS — M25661 Stiffness of right knee, not elsewhere classified: Secondary | ICD-10-CM | POA: Diagnosis not present

## 2017-09-24 DIAGNOSIS — R262 Difficulty in walking, not elsewhere classified: Secondary | ICD-10-CM | POA: Diagnosis not present

## 2017-09-25 DIAGNOSIS — R262 Difficulty in walking, not elsewhere classified: Secondary | ICD-10-CM | POA: Diagnosis not present

## 2017-09-25 DIAGNOSIS — M25661 Stiffness of right knee, not elsewhere classified: Secondary | ICD-10-CM | POA: Diagnosis not present

## 2017-09-25 DIAGNOSIS — M6281 Muscle weakness (generalized): Secondary | ICD-10-CM | POA: Diagnosis not present

## 2017-09-25 DIAGNOSIS — M25561 Pain in right knee: Secondary | ICD-10-CM | POA: Diagnosis not present

## 2017-09-30 DIAGNOSIS — M25561 Pain in right knee: Secondary | ICD-10-CM | POA: Diagnosis not present

## 2017-09-30 DIAGNOSIS — R262 Difficulty in walking, not elsewhere classified: Secondary | ICD-10-CM | POA: Diagnosis not present

## 2017-09-30 DIAGNOSIS — M25661 Stiffness of right knee, not elsewhere classified: Secondary | ICD-10-CM | POA: Diagnosis not present

## 2017-09-30 DIAGNOSIS — M6281 Muscle weakness (generalized): Secondary | ICD-10-CM | POA: Diagnosis not present

## 2017-10-02 DIAGNOSIS — M25561 Pain in right knee: Secondary | ICD-10-CM | POA: Diagnosis not present

## 2017-10-02 DIAGNOSIS — M6281 Muscle weakness (generalized): Secondary | ICD-10-CM | POA: Diagnosis not present

## 2017-10-02 DIAGNOSIS — M25661 Stiffness of right knee, not elsewhere classified: Secondary | ICD-10-CM | POA: Diagnosis not present

## 2017-10-02 DIAGNOSIS — R262 Difficulty in walking, not elsewhere classified: Secondary | ICD-10-CM | POA: Diagnosis not present

## 2017-10-03 DIAGNOSIS — M6281 Muscle weakness (generalized): Secondary | ICD-10-CM | POA: Diagnosis not present

## 2017-10-03 DIAGNOSIS — R262 Difficulty in walking, not elsewhere classified: Secondary | ICD-10-CM | POA: Diagnosis not present

## 2017-10-03 DIAGNOSIS — M25561 Pain in right knee: Secondary | ICD-10-CM | POA: Diagnosis not present

## 2017-10-03 DIAGNOSIS — M25661 Stiffness of right knee, not elsewhere classified: Secondary | ICD-10-CM | POA: Diagnosis not present

## 2017-10-07 DIAGNOSIS — M25661 Stiffness of right knee, not elsewhere classified: Secondary | ICD-10-CM | POA: Diagnosis not present

## 2017-10-07 DIAGNOSIS — M6281 Muscle weakness (generalized): Secondary | ICD-10-CM | POA: Diagnosis not present

## 2017-10-07 DIAGNOSIS — R262 Difficulty in walking, not elsewhere classified: Secondary | ICD-10-CM | POA: Diagnosis not present

## 2017-10-07 DIAGNOSIS — M25561 Pain in right knee: Secondary | ICD-10-CM | POA: Diagnosis not present

## 2017-10-09 DIAGNOSIS — M25561 Pain in right knee: Secondary | ICD-10-CM | POA: Diagnosis not present

## 2017-10-09 DIAGNOSIS — R262 Difficulty in walking, not elsewhere classified: Secondary | ICD-10-CM | POA: Diagnosis not present

## 2017-10-09 DIAGNOSIS — M25661 Stiffness of right knee, not elsewhere classified: Secondary | ICD-10-CM | POA: Diagnosis not present

## 2017-10-09 DIAGNOSIS — M6281 Muscle weakness (generalized): Secondary | ICD-10-CM | POA: Diagnosis not present

## 2017-10-10 DIAGNOSIS — M25661 Stiffness of right knee, not elsewhere classified: Secondary | ICD-10-CM | POA: Diagnosis not present

## 2017-10-10 DIAGNOSIS — M25561 Pain in right knee: Secondary | ICD-10-CM | POA: Diagnosis not present

## 2017-10-10 DIAGNOSIS — M6281 Muscle weakness (generalized): Secondary | ICD-10-CM | POA: Diagnosis not present

## 2017-10-10 DIAGNOSIS — R262 Difficulty in walking, not elsewhere classified: Secondary | ICD-10-CM | POA: Diagnosis not present

## 2017-10-15 DIAGNOSIS — M25561 Pain in right knee: Secondary | ICD-10-CM | POA: Diagnosis not present

## 2017-10-15 DIAGNOSIS — M25661 Stiffness of right knee, not elsewhere classified: Secondary | ICD-10-CM | POA: Diagnosis not present

## 2017-10-15 DIAGNOSIS — R262 Difficulty in walking, not elsewhere classified: Secondary | ICD-10-CM | POA: Diagnosis not present

## 2017-10-15 DIAGNOSIS — M6281 Muscle weakness (generalized): Secondary | ICD-10-CM | POA: Diagnosis not present

## 2017-10-16 DIAGNOSIS — C61 Malignant neoplasm of prostate: Secondary | ICD-10-CM | POA: Diagnosis not present

## 2017-10-17 DIAGNOSIS — M25661 Stiffness of right knee, not elsewhere classified: Secondary | ICD-10-CM | POA: Diagnosis not present

## 2017-10-17 DIAGNOSIS — R262 Difficulty in walking, not elsewhere classified: Secondary | ICD-10-CM | POA: Diagnosis not present

## 2017-10-17 DIAGNOSIS — M25561 Pain in right knee: Secondary | ICD-10-CM | POA: Diagnosis not present

## 2017-10-17 DIAGNOSIS — M6281 Muscle weakness (generalized): Secondary | ICD-10-CM | POA: Diagnosis not present

## 2017-10-21 DIAGNOSIS — M6281 Muscle weakness (generalized): Secondary | ICD-10-CM | POA: Diagnosis not present

## 2017-10-21 DIAGNOSIS — M25561 Pain in right knee: Secondary | ICD-10-CM | POA: Diagnosis not present

## 2017-10-21 DIAGNOSIS — M25661 Stiffness of right knee, not elsewhere classified: Secondary | ICD-10-CM | POA: Diagnosis not present

## 2017-10-21 DIAGNOSIS — R262 Difficulty in walking, not elsewhere classified: Secondary | ICD-10-CM | POA: Diagnosis not present

## 2017-10-22 DIAGNOSIS — C61 Malignant neoplasm of prostate: Secondary | ICD-10-CM | POA: Diagnosis not present

## 2017-10-24 DIAGNOSIS — M25561 Pain in right knee: Secondary | ICD-10-CM | POA: Diagnosis not present

## 2017-10-24 DIAGNOSIS — M6281 Muscle weakness (generalized): Secondary | ICD-10-CM | POA: Diagnosis not present

## 2017-10-24 DIAGNOSIS — M25661 Stiffness of right knee, not elsewhere classified: Secondary | ICD-10-CM | POA: Diagnosis not present

## 2017-10-24 DIAGNOSIS — R262 Difficulty in walking, not elsewhere classified: Secondary | ICD-10-CM | POA: Diagnosis not present

## 2017-10-29 ENCOUNTER — Telehealth: Payer: Self-pay | Admitting: Internal Medicine

## 2017-10-29 DIAGNOSIS — R262 Difficulty in walking, not elsewhere classified: Secondary | ICD-10-CM | POA: Diagnosis not present

## 2017-10-29 DIAGNOSIS — M6281 Muscle weakness (generalized): Secondary | ICD-10-CM | POA: Diagnosis not present

## 2017-10-29 DIAGNOSIS — M25561 Pain in right knee: Secondary | ICD-10-CM | POA: Diagnosis not present

## 2017-10-29 DIAGNOSIS — M25661 Stiffness of right knee, not elsewhere classified: Secondary | ICD-10-CM | POA: Diagnosis not present

## 2017-10-29 NOTE — Telephone Encounter (Signed)
Pt takes Xarelto for afib with CHADS2VASc score of 4 (age, CHF, CAD, HTN). Renal function is normal. Ok to hold Xarelto for 2-3 days prior to procedure.

## 2017-10-29 NOTE — Telephone Encounter (Signed)
New message     [facility] Pre-operative Risk Assessment    Request for surgical clearance:  1. What type of surgery is being performed? Gold Seed Marker Placement with Space OAR  2. When is this surgery scheduled? Sept 24, 2019  3. What type of clearance is required (medical clearance vs. Pharmacy clearance to hold med vs. Both)? Pharmacy  4. Are there any medications that need to be held prior to surgery and how long?rivaroxaban (XARELTO) 20 MG TABS tabletrivaroxaban (XARELTO) 20 MG TABS tablet needs to be held 2 to 3 days prior   5. Practice name and name of physician performing surgery? Dr. Junious Silk,  [facility]  6. What is your office phone number [phone] ext [phone]   7.   What is your office fax number [phone]  8.   Anesthesia type (None, local, MAC, general) ? local   Jon Williams 10/29/2017, 9:17 AM  _________________________________________________________________   (provider comments below)

## 2017-10-30 ENCOUNTER — Telehealth: Payer: Self-pay

## 2017-10-30 DIAGNOSIS — M25561 Pain in right knee: Secondary | ICD-10-CM | POA: Diagnosis not present

## 2017-10-30 DIAGNOSIS — R262 Difficulty in walking, not elsewhere classified: Secondary | ICD-10-CM | POA: Diagnosis not present

## 2017-10-30 DIAGNOSIS — M25661 Stiffness of right knee, not elsewhere classified: Secondary | ICD-10-CM | POA: Diagnosis not present

## 2017-10-30 DIAGNOSIS — M6281 Muscle weakness (generalized): Secondary | ICD-10-CM | POA: Diagnosis not present

## 2017-10-30 NOTE — Telephone Encounter (Signed)
   Primary Cardiologist:Philip [name], MD  Chart reviewed as part of pre-operative protocol coverage. Pt needing to undergo a urologic procedure under local anesthesia.   Per pharmacy, Pt takes Xarelto for afib with CHADS2VASc score of 4 (age, CHF, CAD, HTN). Renal function is normal. Ok to hold Xarelto for 2-3 days prior to procedure.  Will route this bundled recommendation to requesting provider via Epic fax function. Please call with questions.  Lyda Jester, PA-C 10/30/2017, 8:57 AM

## 2017-10-30 NOTE — Telephone Encounter (Signed)
   [facility] Pre-operative Risk Assessment    Request for surgical clearance:  1. What type of surgery is being performed?  Gold Seed placement   2. When is this surgery scheduled?  11/05/17   3. What type of clearance is required (medical clearance vs. Pharmacy clearance to hold med vs. Both)?  both  4. Are there any medications that need to be held prior to surgery and how long? Xarelto 5 days   5. Practice name and name of physician performing surgery?  [facility]/ Dr Junious Silk   6. What is your office phone number [phone]    7.   What is your office fax number [phone]  8.   Anesthesia type (None, local, MAC, general) ?    Jon Como  Lelar Williams 10/30/2017, 11:57 AM  _________________________________________________________________   (provider comments below)

## 2017-11-04 DIAGNOSIS — M25561 Pain in right knee: Secondary | ICD-10-CM | POA: Diagnosis not present

## 2017-11-04 NOTE — Telephone Encounter (Signed)
Duplicate entry

## 2017-11-06 ENCOUNTER — Telehealth: Payer: Self-pay | Admitting: *Deleted

## 2017-11-06 NOTE — Telephone Encounter (Signed)
Called patient to inform not to come Friday due to gold seeds and space oar not being done @ [facility], pt. Understood procedure will be scheduled in the OR then his sim will be rescheduled, spoke with patient and he is aware of this

## 2017-11-08 ENCOUNTER — Ambulatory Visit: Payer: Medicare HMO | Admitting: Podiatry

## 2017-11-08 ENCOUNTER — Ambulatory Visit: Payer: Medicare HMO | Admitting: Radiation Oncology

## 2017-11-08 ENCOUNTER — Encounter: Payer: Self-pay | Admitting: Podiatry

## 2017-11-08 DIAGNOSIS — D689 Coagulation defect, unspecified: Secondary | ICD-10-CM | POA: Diagnosis not present

## 2017-11-08 DIAGNOSIS — M79674 Pain in right toe(s): Secondary | ICD-10-CM

## 2017-11-08 DIAGNOSIS — B351 Tinea unguium: Secondary | ICD-10-CM

## 2017-11-08 DIAGNOSIS — M79675 Pain in left toe(s): Secondary | ICD-10-CM | POA: Diagnosis not present

## 2017-11-08 NOTE — Progress Notes (Signed)
Patient ID: Jon Williams, male   DOB: 05-Aug-1950, 67 y.o.   MRN: [id number] Complaint:  Visit Type: Patient returns to my office for continued preventative foot care services. Complaint: Patient states" my nails have grown long and thick and become painful to walk and wear shoes" . The patient presents for preventative foot care services. No changes to ROS.  Patient has not been seen for 6 months due to knee surgery.  Callus has resolved.  Podiatric Exam: Vascular: dorsalis pedis and posterior tibial pulses are palpable bilateral. Capillary return is immediate. Temperature gradient is WNL. Skin turgor WNL  Sensorium: Normal Semmes Weinstein monofilament test. Normal tactile sensation bilaterally. Nail Exam: Pt has thick disfigured discolored nails with subungual debris noted bilateral entire nail hallux through fifth toenails Ulcer Exam: There is no evidence of ulcer or pre-ulcerative changes or infection. Orthopedic Exam: Muscle tone and strength are WNL. No limitations in general ROM. No crepitus or effusions noted. Foot type and digits show no abnormalities. Bony prominences are unremarkable. 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 Skin: No Porokeratosis. No infection or ulcer.     Diagnosis:  Onychomycosis, , Pain in right toe, pain in left toes     Gardiner Barefoot DPM Treatment & Plan Procedures and Treatment: Consent by patient was obtained for treatment procedures. The patient understood the discussion of treatment and procedures well. All questions were answered thoroughly reviewed. Debridement of mycotic and hypertrophic toenails, 1 through 5 bilateral and clearing of subungual debris. No ulceration, no infection noted.   Return Visit-Office Procedure: Patient instructed to return to the office for a follow up visit 12  weeks. for continued evaluation and treatment.   Gardiner Barefoot DPM

## 2017-11-11 DIAGNOSIS — Z5111 Encounter for antineoplastic chemotherapy: Secondary | ICD-10-CM | POA: Diagnosis not present

## 2017-11-11 DIAGNOSIS — C61 Malignant neoplasm of prostate: Secondary | ICD-10-CM | POA: Diagnosis not present

## 2017-11-13 ENCOUNTER — Telehealth: Payer: Self-pay | Admitting: Internal Medicine

## 2017-11-13 NOTE — Telephone Encounter (Signed)
New Message      [facility] Pre-operative Risk Assessment    Request for surgical clearance:  1. What type of surgery is being performed? Deep Dental Cleaning    2. When is this surgery scheduled? 02/21/2017   3. What type of clearance is required (medical clearance vs. Pharmacy clearance to hold med vs. Both)? Medical   4. Are there any medications that need to be held prior to surgery and how long?  no  5. Practice name and name of physician performing surgery?  [facility] Dr. Cristi Loron  6. What is your office phone number? [phone]    7.   What is your office fax number [phone]  8.   Anesthesia type (None, local, MAC, general) ? Local    Jon Williams 11/13/2017, 2:44 PM  _________________________________________________________________   (provider comments below)

## 2017-11-14 ENCOUNTER — Telehealth: Payer: Self-pay

## 2017-11-14 ENCOUNTER — Other Ambulatory Visit: Payer: Self-pay | Admitting: Urology

## 2017-11-14 ENCOUNTER — Ambulatory Visit (INDEPENDENT_AMBULATORY_CARE_PROVIDER_SITE_OTHER): Payer: Medicare HMO | Admitting: *Deleted

## 2017-11-14 DIAGNOSIS — I5042 Chronic combined systolic (congestive) and diastolic (congestive) heart failure: Secondary | ICD-10-CM

## 2017-11-14 DIAGNOSIS — I428 Other cardiomyopathies: Secondary | ICD-10-CM

## 2017-11-14 NOTE — Progress Notes (Signed)
Remote ICD transmission.   

## 2017-11-14 NOTE — Telephone Encounter (Signed)
Spoke to patient about remote transmission. Asked patient to send in manual transmission d/t frequent ectopy on freeze capture from 0310.  Manual transmission reviewed. Presenting rhythm: AFL/Bp w/(1)PVC. Stable lead measurements. Stable device function.  Informed patient of remote results. Patient verbalized understanding.

## 2017-11-14 NOTE — Telephone Encounter (Signed)
   Primary Cardiologist: Mertie Moores, MD  Chart reviewed as part of pre-operative protocol coverage. Given past medical history and time since last visit, based on ACC/AHA guidelines, BALEY LORIMER would be at acceptable risk for the planned procedure without further cardiovascular testing. No meds need to be held for dental cleaning.   I will route this recommendation to the requesting party via Epic fax function and remove from pre-op pool.  Please call with questions.  Lyda Jester, PA-C 11/14/2017, 3:12 PM

## 2017-11-14 NOTE — Telephone Encounter (Signed)
Pt called to check on his transmission. I let him talk to device tech nurse.

## 2017-11-15 DIAGNOSIS — R262 Difficulty in walking, not elsewhere classified: Secondary | ICD-10-CM | POA: Diagnosis not present

## 2017-11-15 DIAGNOSIS — M6281 Muscle weakness (generalized): Secondary | ICD-10-CM | POA: Diagnosis not present

## 2017-11-15 DIAGNOSIS — M25661 Stiffness of right knee, not elsewhere classified: Secondary | ICD-10-CM | POA: Diagnosis not present

## 2017-11-15 DIAGNOSIS — M25561 Pain in right knee: Secondary | ICD-10-CM | POA: Diagnosis not present

## 2017-11-18 DIAGNOSIS — M25661 Stiffness of right knee, not elsewhere classified: Secondary | ICD-10-CM | POA: Diagnosis not present

## 2017-11-18 DIAGNOSIS — M6281 Muscle weakness (generalized): Secondary | ICD-10-CM | POA: Diagnosis not present

## 2017-11-18 DIAGNOSIS — M25561 Pain in right knee: Secondary | ICD-10-CM | POA: Diagnosis not present

## 2017-11-18 DIAGNOSIS — R262 Difficulty in walking, not elsewhere classified: Secondary | ICD-10-CM | POA: Diagnosis not present

## 2017-11-22 DIAGNOSIS — M25561 Pain in right knee: Secondary | ICD-10-CM | POA: Diagnosis not present

## 2017-11-22 DIAGNOSIS — R262 Difficulty in walking, not elsewhere classified: Secondary | ICD-10-CM | POA: Diagnosis not present

## 2017-11-22 DIAGNOSIS — M25661 Stiffness of right knee, not elsewhere classified: Secondary | ICD-10-CM | POA: Diagnosis not present

## 2017-11-22 DIAGNOSIS — M6281 Muscle weakness (generalized): Secondary | ICD-10-CM | POA: Diagnosis not present

## 2017-11-25 DIAGNOSIS — M25561 Pain in right knee: Secondary | ICD-10-CM | POA: Diagnosis not present

## 2017-11-25 DIAGNOSIS — R262 Difficulty in walking, not elsewhere classified: Secondary | ICD-10-CM | POA: Diagnosis not present

## 2017-11-25 DIAGNOSIS — M25661 Stiffness of right knee, not elsewhere classified: Secondary | ICD-10-CM | POA: Diagnosis not present

## 2017-11-25 DIAGNOSIS — M6281 Muscle weakness (generalized): Secondary | ICD-10-CM | POA: Diagnosis not present

## 2017-11-29 DIAGNOSIS — R262 Difficulty in walking, not elsewhere classified: Secondary | ICD-10-CM | POA: Diagnosis not present

## 2017-11-29 DIAGNOSIS — M25661 Stiffness of right knee, not elsewhere classified: Secondary | ICD-10-CM | POA: Diagnosis not present

## 2017-11-29 DIAGNOSIS — M6281 Muscle weakness (generalized): Secondary | ICD-10-CM | POA: Diagnosis not present

## 2017-11-29 DIAGNOSIS — M25561 Pain in right knee: Secondary | ICD-10-CM | POA: Diagnosis not present

## 2017-12-03 ENCOUNTER — Encounter: Payer: Self-pay | Admitting: Urology

## 2017-12-03 DIAGNOSIS — M6281 Muscle weakness (generalized): Secondary | ICD-10-CM | POA: Diagnosis not present

## 2017-12-03 DIAGNOSIS — M25561 Pain in right knee: Secondary | ICD-10-CM | POA: Diagnosis not present

## 2017-12-03 DIAGNOSIS — M25661 Stiffness of right knee, not elsewhere classified: Secondary | ICD-10-CM | POA: Diagnosis not present

## 2017-12-03 DIAGNOSIS — R262 Difficulty in walking, not elsewhere classified: Secondary | ICD-10-CM | POA: Diagnosis not present

## 2017-12-03 NOTE — Progress Notes (Signed)
Patient is scheduled for fiducial markers and SpaceOAR gel placement 12/24/17. He is unable to undergo MRI of the prostate for verification of SpaceOAR placement status post fiducial marker and SpaceOAR procedure due to having an implantable cardiac defibrillator. -Sandon Yoho

## 2017-12-06 DIAGNOSIS — M25561 Pain in right knee: Secondary | ICD-10-CM | POA: Diagnosis not present

## 2017-12-06 DIAGNOSIS — M25661 Stiffness of right knee, not elsewhere classified: Secondary | ICD-10-CM | POA: Diagnosis not present

## 2017-12-06 DIAGNOSIS — R262 Difficulty in walking, not elsewhere classified: Secondary | ICD-10-CM | POA: Diagnosis not present

## 2017-12-06 DIAGNOSIS — M6281 Muscle weakness (generalized): Secondary | ICD-10-CM | POA: Diagnosis not present

## 2017-12-06 LAB — CUP PACEART REMOTE DEVICE CHECK
Battery Remaining Longevity: 62 mo
Battery Remaining Percentage: 75 %
Battery Voltage: 2.96 V
Brady Statistic AP VP Percent: 88 %
Brady Statistic AP VS Percent: 1 %
Brady Statistic AS VP Percent: 11 %
Brady Statistic AS VS Percent: 1 %
Brady Statistic RA Percent Paced: 8.8 %
Date Time Interrogation Session: 20191002093555
HighPow Impedance: 65 Ohm
HighPow Impedance: 65 Ohm
Implantable Lead Implant Date: 20180413
Implantable Lead Implant Date: 20180413
Implantable Lead Implant Date: 20180413
Implantable Lead Location: 753858
Implantable Lead Location: 753859
Implantable Lead Location: 753860
Implantable Lead Model: 7122
Implantable Pulse Generator Implant Date: 20180413
Lead Channel Impedance Value: 400 Ohm
Lead Channel Impedance Value: 450 Ohm
Lead Channel Impedance Value: 790 Ohm
Lead Channel Pacing Threshold Amplitude: 0.75 V
Lead Channel Pacing Threshold Amplitude: 0.75 V
Lead Channel Pacing Threshold Amplitude: 1 V
Lead Channel Pacing Threshold Pulse Width: 0.5 ms
Lead Channel Pacing Threshold Pulse Width: 0.5 ms
Lead Channel Pacing Threshold Pulse Width: 0.5 ms
Lead Channel Sensing Intrinsic Amplitude: 0.3 mV
Lead Channel Sensing Intrinsic Amplitude: 6.9 mV
Lead Channel Setting Pacing Amplitude: 2 V
Lead Channel Setting Pacing Amplitude: 2.5 V
Lead Channel Setting Pacing Amplitude: 2.5 V
Lead Channel Setting Pacing Pulse Width: 0.5 ms
Lead Channel Setting Pacing Pulse Width: 0.5 ms
Lead Channel Setting Sensing Sensitivity: 0.5 mV
Pulse Gen Serial Number: 7398151

## 2017-12-10 DIAGNOSIS — M25661 Stiffness of right knee, not elsewhere classified: Secondary | ICD-10-CM | POA: Diagnosis not present

## 2017-12-10 DIAGNOSIS — R262 Difficulty in walking, not elsewhere classified: Secondary | ICD-10-CM | POA: Diagnosis not present

## 2017-12-10 DIAGNOSIS — M25561 Pain in right knee: Secondary | ICD-10-CM | POA: Diagnosis not present

## 2017-12-10 DIAGNOSIS — M6281 Muscle weakness (generalized): Secondary | ICD-10-CM | POA: Diagnosis not present

## 2017-12-13 DIAGNOSIS — M6281 Muscle weakness (generalized): Secondary | ICD-10-CM | POA: Diagnosis not present

## 2017-12-13 DIAGNOSIS — M25561 Pain in right knee: Secondary | ICD-10-CM | POA: Diagnosis not present

## 2017-12-13 DIAGNOSIS — M25661 Stiffness of right knee, not elsewhere classified: Secondary | ICD-10-CM | POA: Diagnosis not present

## 2017-12-13 DIAGNOSIS — R262 Difficulty in walking, not elsewhere classified: Secondary | ICD-10-CM | POA: Diagnosis not present

## 2017-12-17 ENCOUNTER — Encounter (HOSPITAL_COMMUNITY): Payer: Self-pay

## 2017-12-17 DIAGNOSIS — M25561 Pain in right knee: Secondary | ICD-10-CM | POA: Diagnosis not present

## 2017-12-17 DIAGNOSIS — M6281 Muscle weakness (generalized): Secondary | ICD-10-CM | POA: Diagnosis not present

## 2017-12-17 DIAGNOSIS — R262 Difficulty in walking, not elsewhere classified: Secondary | ICD-10-CM | POA: Diagnosis not present

## 2017-12-17 DIAGNOSIS — M25661 Stiffness of right knee, not elsewhere classified: Secondary | ICD-10-CM | POA: Diagnosis not present

## 2017-12-17 NOTE — Pre-Procedure Instructions (Signed)
The following are in epic: Cardiac clearance Dr. Lovena Le Telephone encounter 11/13/2017 Pacer check 12/06/2017 EKG 04/12/2017

## 2017-12-17 NOTE — Patient Instructions (Signed)
Your procedure is scheduled on: Tuesday, Nov. 13, 2019   Surgery Time: 8:00AM-8:30AM   Report to [place]  Entrance    Report to admitting at 6:00 AM   Call this number if you have problems the morning of surgery [phone]   Do not eat food or drink liquids :After Midnight.   Brush your teeth the morning of surgery.   Do NOT smoke after Midnight   Take these medicines the morning of surgery with A SIP OF WATER: Carvedilol, Sotalol              You may not have any metal on your body including jewelry, and body piercings             Do not wear lotions, powders, perfumes/cologne, or deodorant                           Men may shave face and neck.   Do not bring valuables to the hospital. [place].   Contacts, dentures or bridgework may not be worn into surgery.    Patients discharged the day of surgery will not be allowed to drive home.   Special Instructions: Bring a copy of your healthcare power of attorney and living will documents         the day of surgery if you haven't scanned them in before.              Please read over the following fact sheets you were given:  [facility] - Preparing for Surgery Before surgery, you can play an important role.  Because skin is not sterile, your skin needs to be as free of germs as possible.  You can reduce the number of germs on your skin by washing with CHG (chlorahexidine gluconate) soap before surgery.  CHG is an antiseptic cleaner which kills germs and bonds with the skin to continue killing germs even after washing. Please DO NOT use if you have an allergy to CHG or antibacterial soaps.  If your skin becomes reddened/irritated stop using the CHG and inform your nurse when you arrive at Short Stay. Do not shave (including legs and underarms) for at least 48 hours prior to the first CHG shower.  You may shave your face/neck.  Please follow these instructions  carefully:  1.  Shower with CHG Soap the night before surgery and the  morning of surgery.  2.  If you choose to wash your hair, wash your hair first as usual with your normal  shampoo.  3.  After you shampoo, rinse your hair and body thoroughly to remove the shampoo.                             4.  Use CHG as you would any other liquid soap.  You can apply chg directly to the skin and wash.  Gently with a scrungie or clean washcloth.  5.  Apply the CHG Soap to your body ONLY FROM THE NECK DOWN.   Do   not use on face/ open                           Wound or open sores. Avoid contact with eyes, ears mouth and  genitals (private parts).                       Wash face,  Genitals (private parts) with your normal soap.             6.  Wash thoroughly, paying special attention to the area where your    surgery  will be performed.  7.  Thoroughly rinse your body with warm water from the neck down.  8.  DO NOT shower/wash with your normal soap after using and rinsing off the CHG Soap.                9.  Pat yourself dry with a clean towel.            10.  Wear clean pajamas.            11.  Place clean sheets on your bed the night of your first shower and do not  sleep with pets. Day of Surgery : Do not apply any lotions/deodorants the morning of surgery.  Please wear clean clothes to the hospital/surgery center.  FAILURE TO FOLLOW THESE INSTRUCTIONS MAY RESULT IN THE CANCELLATION OF YOUR SURGERY  PATIENT SIGNATURE_________________________________  NURSE SIGNATURE__________________________________  ________________________________________________________________________

## 2017-12-18 ENCOUNTER — Encounter (HOSPITAL_COMMUNITY)
Admission: RE | Admit: 2017-12-18 | Discharge: 2017-12-18 | Disposition: A | Discharge status: Home / Self Care | Payer: Medicare HMO | Source: Ambulatory Visit | Attending: Urology | Admitting: Urology

## 2017-12-18 ENCOUNTER — Other Ambulatory Visit: Payer: Self-pay

## 2017-12-18 ENCOUNTER — Encounter (HOSPITAL_COMMUNITY): Payer: Self-pay

## 2017-12-18 DIAGNOSIS — Z01812 Encounter for preprocedural laboratory examination: Secondary | ICD-10-CM | POA: Insufficient documentation

## 2017-12-18 HISTORY — DX: Personal history of colonic polyps: Z86.010

## 2017-12-18 HISTORY — DX: Unspecified osteoarthritis, unspecified site: M19.90

## 2017-12-18 HISTORY — DX: Bunion of right foot: M21.611

## 2017-12-18 HISTORY — DX: Personal history of other diseases of the circulatory system: Z86.79

## 2017-12-18 HISTORY — DX: Diverticulosis of large intestine without perforation or abscess without bleeding: K57.30

## 2017-12-18 LAB — CBC
HCT: 40.7 % (ref 39.0–52.0)
Hemoglobin: 13.2 g/dL (ref 13.0–17.0)
MCH: 30.3 pg (ref 26.0–34.0)
MCHC: 32.4 g/dL (ref 30.0–36.0)
MCV: 93.6 fL (ref 80.0–100.0)
Platelets: 200 10*3/uL (ref 150–400)
RBC: 4.35 MIL/uL (ref 4.22–5.81)
RDW: 12.9 % (ref 11.5–15.5)
WBC: 6.3 10*3/uL (ref 4.0–10.5)
nRBC: 0 % (ref 0.0–0.2)

## 2017-12-18 LAB — BASIC METABOLIC PANEL
Anion gap: 11 (ref 5–15)
BUN: 23 mg/dL (ref 8–23)
CO2: 25 mmol/L (ref 22–32)
Calcium: 9.6 mg/dL (ref 8.9–10.3)
Chloride: 103 mmol/L (ref 98–111)
Creatinine, Ser: 1.25 mg/dL — ABNORMAL HIGH (ref 0.61–1.24)
GFR calc Af Amer: >60 mL/min (ref >60)
GFR calc non Af Amer: 57 mL/min — ABNORMAL LOW (ref >60)
Glucose, Bld: 126 mg/dL — ABNORMAL HIGH (ref 70–99)
Potassium: 3.7 mmol/L (ref 3.5–5.1)
Sodium: 139 mmol/L (ref 135–145)

## 2017-12-18 NOTE — Pre-Procedure Instructions (Signed)
BMP results 12/18/2017 sent to Dr. Junious Silk via epic.

## 2017-12-20 DIAGNOSIS — M25661 Stiffness of right knee, not elsewhere classified: Secondary | ICD-10-CM | POA: Diagnosis not present

## 2017-12-20 DIAGNOSIS — R262 Difficulty in walking, not elsewhere classified: Secondary | ICD-10-CM | POA: Diagnosis not present

## 2017-12-20 DIAGNOSIS — M6281 Muscle weakness (generalized): Secondary | ICD-10-CM | POA: Diagnosis not present

## 2017-12-20 DIAGNOSIS — M25561 Pain in right knee: Secondary | ICD-10-CM | POA: Diagnosis not present

## 2017-12-23 DIAGNOSIS — M25661 Stiffness of right knee, not elsewhere classified: Secondary | ICD-10-CM | POA: Diagnosis not present

## 2017-12-23 DIAGNOSIS — M6281 Muscle weakness (generalized): Secondary | ICD-10-CM | POA: Diagnosis not present

## 2017-12-23 DIAGNOSIS — R262 Difficulty in walking, not elsewhere classified: Secondary | ICD-10-CM | POA: Diagnosis not present

## 2017-12-23 DIAGNOSIS — M25561 Pain in right knee: Secondary | ICD-10-CM | POA: Diagnosis not present

## 2017-12-23 NOTE — Anesthesia Preprocedure Evaluation (Addendum)
Anesthesia Evaluation  Patient identified by MRN, date of birth, ID band Patient awake    Reviewed: Allergy & Precautions, NPO status , Patient's Chart, lab work & pertinent test results  History of Anesthesia Complications Negative for: history of anesthetic complications  Airway Mallampati: III  TM Distance: >3 FB Neck ROM: Full    Dental  (+) Dental Advisory Given, Implants   Pulmonary neg pulmonary ROS,    breath sounds clear to auscultation       Cardiovascular (-) angina+ CAD and +CHF  + dysrhythmias Atrial Fibrillation + Cardiac Defibrillator  Rhythm:Irregular Rate:Normal  Walks 3-4 miles per day without pausing due to angina or SOB  '18 TTE - LV cavity was moderately dilated with mild concentric hypertrophy. EF 15% to 20%. Severe diffuse hypokinesis. Akinesis of the basal-midinferior and inferoseptal myocardium. Grade 3 diastolic dysfunction. Mild to moderate MR. LA was severely dilated. RV systolic function was moderately reduced. RA was moderately dilated. Mild TR. PASP was moderately increased. PA peak pressure: 50 mm Hg  '18 Cath - 1. Mild to moderate, non-obstructive coronary artery disease consistent with non-ischemic cardiomyopathy. 2. Upper normal to mildly elevated left and right heart filling pressures. 3. Mild pulmonary hypertension. 4. Reduced Fick cardiac output/index.    Neuro/Psych negative neurological ROS  negative psych ROS   GI/Hepatic negative GI ROS, Neg liver ROS,   Endo/Other  negative endocrine ROS  Renal/GU Renal InsufficiencyRenal disease    Prostate cancer     Musculoskeletal  (+) Arthritis , Osteoarthritis,    Abdominal   Peds  Hematology negative hematology ROS (+)   Anesthesia Other Findings   Reproductive/Obstetrics                            Anesthesia Physical Anesthesia Plan  ASA: IV  Anesthesia Plan: General   Post-op Pain Management:     Induction: Intravenous  PONV Risk Score and Plan: 2 and Treatment may vary due to age or medical condition, Ondansetron and Dexamethasone  Airway Management Planned: LMA  Additional Equipment: None  Intra-op Plan:   Post-operative Plan: Extubation in OR  Informed Consent: I have reviewed the patients History and Physical, chart, labs and discussed the procedure including the risks, benefits and alternatives for the proposed anesthesia with the patient or authorized representative who has indicated his/her understanding and acceptance.   Dental advisory given  Plan Discussed with: CRNA and Anesthesiologist  Anesthesia Plan Comments:        Anesthesia Quick Evaluation

## 2017-12-24 ENCOUNTER — Ambulatory Visit (HOSPITAL_COMMUNITY): Payer: Medicare HMO

## 2017-12-24 ENCOUNTER — Ambulatory Visit (HOSPITAL_COMMUNITY): Payer: Medicare HMO | Admitting: Anesthesiology

## 2017-12-24 ENCOUNTER — Ambulatory Visit (HOSPITAL_COMMUNITY)
Admission: RE | Admit: 2017-12-24 | Discharge: 2017-12-24 | Disposition: A | Discharge status: Home / Self Care | Payer: Medicare HMO | Source: Ambulatory Visit | Attending: Urology | Admitting: Urology

## 2017-12-24 ENCOUNTER — Other Ambulatory Visit (HOSPITAL_COMMUNITY): Payer: Self-pay

## 2017-12-24 ENCOUNTER — Encounter (HOSPITAL_COMMUNITY): Payer: Self-pay

## 2017-12-24 ENCOUNTER — Encounter (HOSPITAL_COMMUNITY)
Admission: RE | Disposition: A | Discharge status: Home / Self Care | Payer: Self-pay | Source: Ambulatory Visit | Attending: Urology

## 2017-12-24 DIAGNOSIS — Z96651 Presence of right artificial knee joint: Secondary | ICD-10-CM | POA: Insufficient documentation

## 2017-12-24 DIAGNOSIS — I5042 Chronic combined systolic (congestive) and diastolic (congestive) heart failure: Secondary | ICD-10-CM | POA: Diagnosis not present

## 2017-12-24 DIAGNOSIS — I2729 Other secondary pulmonary hypertension: Secondary | ICD-10-CM | POA: Diagnosis not present

## 2017-12-24 DIAGNOSIS — I428 Other cardiomyopathies: Secondary | ICD-10-CM | POA: Insufficient documentation

## 2017-12-24 DIAGNOSIS — Z95810 Presence of automatic (implantable) cardiac defibrillator: Secondary | ICD-10-CM | POA: Insufficient documentation

## 2017-12-24 DIAGNOSIS — Z886 Allergy status to analgesic agent status: Secondary | ICD-10-CM | POA: Insufficient documentation

## 2017-12-24 DIAGNOSIS — M1990 Unspecified osteoarthritis, unspecified site: Secondary | ICD-10-CM | POA: Diagnosis not present

## 2017-12-24 DIAGNOSIS — Z8249 Family history of ischemic heart disease and other diseases of the circulatory system: Secondary | ICD-10-CM | POA: Diagnosis not present

## 2017-12-24 DIAGNOSIS — C61 Malignant neoplasm of prostate: Secondary | ICD-10-CM | POA: Diagnosis not present

## 2017-12-24 DIAGNOSIS — Z8546 Personal history of malignant neoplasm of prostate: Secondary | ICD-10-CM | POA: Diagnosis not present

## 2017-12-24 DIAGNOSIS — Z833 Family history of diabetes mellitus: Secondary | ICD-10-CM | POA: Insufficient documentation

## 2017-12-24 DIAGNOSIS — I110 Hypertensive heart disease with heart failure: Secondary | ICD-10-CM | POA: Insufficient documentation

## 2017-12-24 DIAGNOSIS — Z809 Family history of malignant neoplasm, unspecified: Secondary | ICD-10-CM | POA: Insufficient documentation

## 2017-12-24 DIAGNOSIS — I4891 Unspecified atrial fibrillation: Secondary | ICD-10-CM | POA: Insufficient documentation

## 2017-12-24 DIAGNOSIS — K5790 Diverticulosis of intestine, part unspecified, without perforation or abscess without bleeding: Secondary | ICD-10-CM | POA: Diagnosis not present

## 2017-12-24 DIAGNOSIS — I2510 Atherosclerotic heart disease of native coronary artery without angina pectoris: Secondary | ICD-10-CM | POA: Diagnosis not present

## 2017-12-24 DIAGNOSIS — I4892 Unspecified atrial flutter: Secondary | ICD-10-CM | POA: Insufficient documentation

## 2017-12-24 DIAGNOSIS — Z86010 Personal history of colonic polyps: Secondary | ICD-10-CM | POA: Insufficient documentation

## 2017-12-24 DIAGNOSIS — Z882 Allergy status to sulfonamides status: Secondary | ICD-10-CM | POA: Insufficient documentation

## 2017-12-24 DIAGNOSIS — Z7901 Long term (current) use of anticoagulants: Secondary | ICD-10-CM | POA: Diagnosis not present

## 2017-12-24 DIAGNOSIS — I447 Left bundle-branch block, unspecified: Secondary | ICD-10-CM | POA: Insufficient documentation

## 2017-12-24 DIAGNOSIS — Z79899 Other long term (current) drug therapy: Secondary | ICD-10-CM | POA: Diagnosis not present

## 2017-12-24 HISTORY — PX: GOLD SEED IMPLANT: SHX6343

## 2017-12-24 HISTORY — PX: SPACE OAR INSTILLATION: SHX6769

## 2017-12-24 SURGERY — INSERTION, GOLD SEEDS
Anesthesia: General

## 2017-12-24 MED ORDER — PROPOFOL 10 MG/ML IV BOLUS
INTRAVENOUS | Status: AC
  Filled 2017-12-24: qty 20

## 2017-12-24 MED ORDER — ONDANSETRON HCL 4 MG/2ML IJ SOLN
INTRAMUSCULAR | Status: DC | PRN
  Administered 2017-12-24: 4 mg via INTRAVENOUS

## 2017-12-24 MED ORDER — PHENYLEPHRINE 40 MCG/ML (10ML) SYRINGE FOR IV PUSH (FOR BLOOD PRESSURE SUPPORT)
PREFILLED_SYRINGE | INTRAVENOUS | Status: DC | PRN
  Administered 2017-12-24: 80 ug via INTRAVENOUS
  Administered 2017-12-24: 40 ug via INTRAVENOUS

## 2017-12-24 MED ORDER — 0.9 % SODIUM CHLORIDE (POUR BTL) OPTIME
TOPICAL | Status: DC | PRN
  Administered 2017-12-24: 1000 mL

## 2017-12-24 MED ORDER — DEXAMETHASONE SODIUM PHOSPHATE 10 MG/ML IJ SOLN
INTRAMUSCULAR | Status: AC
  Filled 2017-12-24: qty 1

## 2017-12-24 MED ORDER — PHENYLEPHRINE 40 MCG/ML (10ML) SYRINGE FOR IV PUSH (FOR BLOOD PRESSURE SUPPORT)
PREFILLED_SYRINGE | INTRAVENOUS | Status: AC
  Filled 2017-12-24: qty 10

## 2017-12-24 MED ORDER — PHENYLEPHRINE HCL-NACL 10-0.9 MG/250ML-% IV SOLN
INTRAVENOUS | Status: AC
  Filled 2017-12-24: qty 250

## 2017-12-24 MED ORDER — DEXAMETHASONE SODIUM PHOSPHATE 10 MG/ML IJ SOLN
INTRAMUSCULAR | Status: DC | PRN
  Administered 2017-12-24: 10 mg via INTRAVENOUS

## 2017-12-24 MED ORDER — LACTATED RINGERS IV SOLN
INTRAVENOUS | Status: DC
  Administered 2017-12-24: 07:00:00 via INTRAVENOUS

## 2017-12-24 MED ORDER — CEPHALEXIN 500 MG PO CAPS: 500.0000 mg | ORAL_CAPSULE | Freq: Three times a day (TID) | ORAL | 0 refills | Status: AC

## 2017-12-24 MED ORDER — CEFAZOLIN SODIUM-DEXTROSE 2-4 GM/100ML-% IV SOLN
2.0000 g | Freq: Once | INTRAVENOUS | Status: AC
  Administered 2017-12-24: 2 g via INTRAVENOUS
  Filled 2017-12-24: qty 100

## 2017-12-24 MED ORDER — FENTANYL CITRATE (PF) 100 MCG/2ML IJ SOLN
INTRAMUSCULAR | Status: AC
  Filled 2017-12-24: qty 2

## 2017-12-24 MED ORDER — ONDANSETRON HCL 4 MG/2ML IJ SOLN
INTRAMUSCULAR | Status: AC
  Filled 2017-12-24: qty 2

## 2017-12-24 MED ORDER — OXYCODONE HCL 5 MG PO TABS: 5.0000 mg | ORAL_TABLET | Freq: Once | ORAL | Status: DC | PRN

## 2017-12-24 MED ORDER — EPHEDRINE 5 MG/ML INJ
INTRAVENOUS | Status: AC
  Filled 2017-12-24: qty 10

## 2017-12-24 MED ORDER — RIVAROXABAN 20 MG PO TABS: 20.0000 mg | ORAL_TABLET | Freq: Every day | ORAL | 3 refills | Status: DC

## 2017-12-24 MED ORDER — ONDANSETRON HCL 4 MG/2ML IJ SOLN: 4.0000 mg | Freq: Once | INTRAMUSCULAR | Status: DC | PRN

## 2017-12-24 MED ORDER — FENTANYL CITRATE (PF) 100 MCG/2ML IJ SOLN
INTRAMUSCULAR | Status: DC | PRN
  Administered 2017-12-24: 25 ug via INTRAVENOUS

## 2017-12-24 MED ORDER — SODIUM CHLORIDE (PF) 0.9 % IJ SOLN
INTRAMUSCULAR | Status: DC | PRN
  Administered 2017-12-24: 3 mL

## 2017-12-24 MED ORDER — EPHEDRINE SULFATE-NACL 50-0.9 MG/10ML-% IV SOSY
PREFILLED_SYRINGE | INTRAVENOUS | Status: DC | PRN
  Administered 2017-12-24: 10 mg via INTRAVENOUS

## 2017-12-24 MED ORDER — OXYCODONE HCL 5 MG/5ML PO SOLN
5.0000 mg | Freq: Once | ORAL | Status: DC | PRN
  Filled 2017-12-24: qty 5

## 2017-12-24 MED ORDER — PROPOFOL 10 MG/ML IV BOLUS
INTRAVENOUS | Status: DC | PRN
  Administered 2017-12-24: 80 mg via INTRAVENOUS

## 2017-12-24 MED ORDER — MIDAZOLAM HCL 2 MG/2ML IJ SOLN
INTRAMUSCULAR | Status: AC
  Filled 2017-12-24: qty 2

## 2017-12-24 MED ORDER — LIDOCAINE 2% (20 MG/ML) 5 ML SYRINGE
INTRAMUSCULAR | Status: AC
  Filled 2017-12-24: qty 5

## 2017-12-24 MED ORDER — FENTANYL CITRATE (PF) 100 MCG/2ML IJ SOLN
25.0000 ug | INTRAMUSCULAR | Status: DC | PRN
  Administered 2017-12-24 (×2): 50 ug via INTRAVENOUS

## 2017-12-24 MED ORDER — LIDOCAINE 2% (20 MG/ML) 5 ML SYRINGE
INTRAMUSCULAR | Status: DC | PRN
  Administered 2017-12-24: 80 mg via INTRAVENOUS

## 2017-12-24 SURGICAL SUPPLY — 19 items
COVER SURGICAL LIGHT HANDLE (MISCELLANEOUS) IMPLANT
COVER WAND RF STERILE (DRAPES) IMPLANT
DRSG TELFA 3X8 NADH (GAUZE/BANDAGES/DRESSINGS) ×2 IMPLANT
GAUZE SPONGE 4X4 12PLY STRL (GAUZE/BANDAGES/DRESSINGS) ×2 IMPLANT
GLOVE BIO SURGEON STRL SZ7.5 (GLOVE) ×2 IMPLANT
IMPL SPACEOAR SYSTEM 10ML (Spacer) ×1 IMPLANT
IMPLANT SPACEOAR SYSTEM 10ML (Spacer) ×2 IMPLANT
KIT ROOM TURNOVER WOR (KITS) ×2 IMPLANT
MARKER GOLD PRELOAD 1.2X3 (Urological Implant) ×1 IMPLANT
NDL SAFETY ECLIPSE 18X1.5 (NEEDLE) IMPLANT
NEEDLE HYPO 18GX1.5 SHARP (NEEDLE)
NEEDLE SPNL 22GX7 QUINCKE BK (NEEDLE) IMPLANT
SEED GOLD PRELOAD 1.2X3 (Urological Implant) ×2 IMPLANT
SURGILUBE 2OZ TUBE FLIPTOP (MISCELLANEOUS) ×2 IMPLANT
SYR 20CC LL (SYRINGE) IMPLANT
TOWEL OR 17X26 10 PK STRL BLUE (TOWEL DISPOSABLE) IMPLANT
TUBE CONNECTING 12X1/4 (SUCTIONS) IMPLANT
UNDERPAD 30X30 (UNDERPADS AND DIAPERS) ×2 IMPLANT
WATER STERILE IRR 500ML POUR (IV SOLUTION) ×2 IMPLANT

## 2017-12-24 NOTE — Anesthesia Procedure Notes (Signed)
Procedure Name: LMA Insertion Date/Time: 12/24/2017 8:08 AM Performed by: Maxwell Caul, CRNA Pre-anesthesia Checklist: Patient identified, Emergency Drugs available, Suction available and Patient being monitored Patient Re-evaluated:Patient Re-evaluated prior to induction Oxygen Delivery Method: Circle system utilized Induction Type: Combination inhalational/ intravenous induction Ventilation: Mask ventilation without difficulty LMA: LMA inserted LMA Size: 4.0 Number of attempts: 1 Placement Confirmation: positive ETCO2 and breath sounds checked- equal and bilateral Tube secured with: Tape Dental Injury: Teeth and Oropharynx as per pre-operative assessment

## 2017-12-24 NOTE — Discharge Instructions (Signed)
Transrectal Ultrasound-Guided Prostate Gold Seed and Biodegradeable Gel (SpaceOar) Placement, Care After This sheet gives you information about how to care for yourself after your procedure. Your health care provider may also give you more specific instructions. If you have problems or questions, contact your health care provider. What can I expect after the procedure? After the procedure, it is common to have:  Light bleeding and bruising or tenderness in the area behind the scrotum and in front of the th rectum (perineum).  Small amounts of blood in your urine. This should only last for a couple of days.  Light brown or red semen. This may last for a couple of weeks.  Follow these instructions at home: Medicines  Take over-the-counter and prescription medicines only as told by your health care provider.  If you were prescribed an antibiotic medicine, take it as told by your health care provider. Do not stop taking the antibiotic even if you start to feel better. Activity  Do not drive for 24 hours if you were given a medicine to help you relax (sedative) during your procedure.  Do not drive or use heavy machinery while taking prescription pain medicine.  Return to your normal activities as told by your health care provider. Ask your health care provider what activities are safe for you.  Ask your health care provider when it is safe for you to resume sexual activity. General instructions  Do not take baths, swim, or use a hot tub until your health care provider approves.  Drink enough water to keep your urine pale yellow.  Keep all follow-up visits as told by your health care provider. This is important. Managing pain, stiffness, and swelling  If directed, put ice on the affected area: ? Put ice in a plastic bag. ? Place a towel between your skin and the bag. ? Leave the ice on for 20 minutes, 2-3 times a day.  Try not to sit directly on the area behind the scrotum. A soft  cushion can help with discomfort. Contact a health care provider if:  You have a fever or chills.  You have more blood in your urine instead of less.  You have blood in your urine for more than 2-3 days after the procedure.  You have trouble urinating or having a bowel movement.  You have pain or burning when urinating.  You have nausea or you vomit. Get help right away if:  You have severe pain that does not get better with medicine.  Your urine is bright red.  You cannot urinate.  You have rectal bleeding that gets worse.  You have shortness of breath. Summary  After the procedure, you may have blood in your urine and light bleeding from the rectum.  Return to your normal activities as told by your health care provider. Ask your health care provider what activities are safe for you.  Take over-the-counter and prescription medicines only as told by your health care provider.  Contact your health care provider right away if you have increased bleeding during urination or increased rectal bleeding. This information is not intended to replace advice given to you by your health care provider. Make sure you discuss any questions you have with your health care provider. Document Released: 05/15/2016 Document Revised: 05/15/2016 Document Reviewed: 05/15/2016 Elsevier Interactive Patient Education  [employer].

## 2017-12-24 NOTE — Op Note (Signed)
Preoperative diagnosis: Prostate cancer Postoperative diagnosis: Same  Procedure: Transrectal ultrasound with transperineal placement of gold seed prostate fiducial markers and biodegradable gel (SpaceOar)  Surgeon: Junious Silk  Radiation oncologist Tammi Klippel  Indication for procedure: 67 year old African-American male with high risk prostate cancer.  He has been on androgen deprivation and needs pre-radiation fiducial marker and biodegradable gel placement.  Findings: Prostate appeared normal on ultrasound.  On DRE the prostate was small without hard area or nodule and all landmarks preserved.  No evidence of prostate cancer extension.  Description of procedure: After consent was obtained patient brought to the operating room.  After adequate anesthesia was placed in lithotomy position in the scrotum elevated and secured out of the way.  The perineum was prepped in the usual sterile fashion.  Transrectal ultrasound was inserted per urethra and the prostate imaged.  3 gold seed fiducial markers were placed by inserting a needle transperineal about 2 cm anterior to the anal opening under ultrasonic guidance.  A marker was placed at the right base, right apex and left mid.  Next, the 18-gauge needle was then inserted approximately 1 to 2 cm anterior to the anal opening and directed under fluoroscopic guidance into the perirectal fat between the anterior rectal wall and the prostate capsule down to the mid-gland. Midline needle position was confirmed in the sagittal and axial views to verify the tip was in the perirectal fat. Aspiration confirmed no intravascular access. Small amounts of saline were injected to hydrodissect the space between the prostate and the anterior rectal wall.  Axial imaging was viewed to confirm the needle was in the correct location in the mid gland and centered.  The saline syringe was carefully disconnected maintaining the desired needle position and the hydrogel was attached to  the needle.  Under ultrasound guidance in the sagittal view a smooth continuous injection was done over about 12 seconds delivering the hydrogel into the space between the prostate and rectal wall.  The needle was withdrawn. The [place] was removed and I did a DRE. No rectal bleeding noted. The perineum was cleaned and sterile gauze placed.  He was awakened taken to recovery room in stable condition.  Complications: None  EBL: minimal  Drains: None  Specimens: None  Disposition: Patient stable to PACU

## 2017-12-24 NOTE — Transfer of Care (Signed)
Immediate Anesthesia Transfer of Care Note  Patient: Jon Williams  Procedure(s) Performed: GOLD SEED IMPLANT, TRANSERINEAL (N/A ) SPACE OAR INSTILLATION (N/A )  Patient Location: PACU  Anesthesia Type:General  Level of Consciousness: awake, alert  and oriented  Airway & Oxygen Therapy: Patient Spontanous Breathing and Patient connected to face mask oxygen  Post-op Assessment: Report given to RN and Post -op Vital signs reviewed and stable  Post vital signs: Reviewed and stable  Last Vitals:  Vitals Value Taken Time  BP    Temp    Pulse 68 12/24/2017  8:43 AM  Resp 12 12/24/2017  8:43 AM  SpO2 100 % 12/24/2017  8:43 AM  Vitals shown include unvalidated device data.  Last Pain:  Vitals:   12/24/17 0715  TempSrc:   PainSc: 0-No pain         Complications: No apparent anesthesia complications

## 2017-12-24 NOTE — Anesthesia Postprocedure Evaluation (Signed)
Anesthesia Post Note  Patient: Jon Williams  Procedure(s) Performed: GOLD SEED IMPLANT, TRANSERINEAL (N/A ) SPACE OAR INSTILLATION (N/A )     Patient location during evaluation: PACU Anesthesia Type: General Level of consciousness: awake and alert Pain management: pain level controlled Vital Signs Assessment: post-procedure vital signs reviewed and stable Respiratory status: spontaneous breathing, nonlabored ventilation and respiratory function stable Cardiovascular status: blood pressure returned to baseline and stable Postop Assessment: no apparent nausea or vomiting Anesthetic complications: no    Last Vitals:  Vitals:   12/24/17 0930 12/24/17 0955  BP: (!) 124/93 (!) 139/100  Pulse: 70 70  Resp: (!) 8   Temp: 37.1 C 37.1 C  SpO2: 98%     Last Pain:  Vitals:   12/24/17 0955  TempSrc: Oral  PainSc: 0-No pain                 Audry Pili

## 2017-12-24 NOTE — H&P (Signed)
H&P  Chief Complaint: Prostate cancer  History of Present Illness: [name] is a 67 year old African-American male with high risk prostate cancer (PSA 6.7, T2b with right induration, Gleason 4+4, 4+3 and 3+4) he was started androgen deprivation March 2019.  He delayed radiation therapy to have his right knee replaced.  He presents today for transperineal gold seed fiducial marker placement and spacer placement after failed office attempt (pt could not tolerate positioning and transrectal ultrasound).  He is well without dysuria or gross hematuria.  Past Medical History:  Diagnosis Date  . AICD (automatic cardioverter/defibrillator) present 05/25/2016   biv icd  . Bunion, right   . Chronic combined systolic and diastolic CHF (congestive heart failure) ([place])    Echo 1/18: Mild conc LVH, EF 15-20, severe diff HK, inf and inf-septal AK, Gr 3 DD, mild to mod MR, severe LAE, mod reduced RVSF, mod RAE, mild TR, PASP 50  . Coronary artery disease involving native coronary artery without angina pectoris 04/17/2016   LHC 1/18: pLCx 30, mLCx 20, mRCA 40, dRCA 20, LVEDP 23, mean RA 8, PA 42/20, PCWP 17  . DJD (degenerative joint disease)   . History of atrial fibrillation   . History of atrial flutter   . History of cardiomegaly 06/07/2016   Noted on CXR  . History of colon polyps 06/28/2017   Noted on colonoscopy  . LBBB (left bundle branch block)   . NICM (nonischemic cardiomyopathy) ([place])    Echo 1/18:  Mild conc LVH, EF 15-20, severe diff HK, inf and inf-septal AK, Gr 3 DD, mild to mod MR, severe LAE, mod reduced RVSF, mod RAE, mild TR, PASP 50  . OA (osteoarthritis)    knee  . Other secondary pulmonary hypertension ([place]) 04/17/2016  . Prostate cancer ([place]) 2019  . Sigmoid diverticulosis 06/28/2017   Noted on colonoscopy   Past Surgical History:  Procedure Laterality Date  . BIV ICD INSERTION CRT-D N/A 05/25/2016   Procedure: BiV ICD Insertion CRT-D;  Surgeon: [name], MD;  Location:  [place];  Service: Cardiovascular;  Laterality: N/A;  . CARDIAC CATHETERIZATION N/A 03/02/2016   Procedure: Right/Left Heart Cath and Coronary Angiography;  Surgeon: Nelva Bush, MD;  Location: [facility] [place] [facility];  Service: Cardiovascular;  Laterality: N/A;  . CARDIOVERSION N/A 07/17/2016   Procedure: Cardioversion;  Surgeon: [name], MD;  Location: [facility];  Service: Cardiovascular;  Laterality: N/A;  . COLONOSCOPY WITH PROPOFOL N/A 06/28/2017   Procedure: COLONOSCOPY WITH PROPOFOL;  Surgeon: [name], MD;  Location: [facility];  Service: Endoscopy;  Laterality: N/A;  . colonscopy  2009  . INSERT / REPLACE / REMOVE PACEMAKER    . POLYPECTOMY  06/28/2017   Procedure: POLYPECTOMY;  Surgeon: [name], MD;  Location: [facility];  Service: Endoscopy;;  ascending and descending colon polyp  . PROSTATE BIOPSY  02/20/2017  . TOTAL KNEE ARTHROPLASTY Right 08/16/2017   Procedure: RIGHT TOTAL KNEE ARTHROPLASTY;  Surgeon: [name], MD;  Location: [facility];  Service: Orthopedics;  Laterality: Right;    Home Medications:  Medications Prior to Admission  Medication Sig Dispense Refill Last Dose  . atorvastatin (LIPITOR) 40 MG tablet Take 1 tablet (40 mg total) by mouth daily. (Patient taking differently: Take 40 mg by mouth every evening. ) 90 tablet 3 12/23/2017 at Unknown time  . carvedilol (COREG) 3.125 MG tablet Take 1 tablet (3.125 mg total) by mouth 2 (two) times daily. 180 tablet 3 12/24/2017 at 0400  .  furosemide (LASIX) 40 MG tablet TAKE 1 TABLET BY MOUTH TWICE A DAY (Patient taking differently: Take 40 mg by mouth 2 (two) times daily. ) 180 tablet 3 12/23/2017 at Unknown time  . Multiple Vitamin (MULTIVITAMIN WITH MINERALS) TABS tablet Take 1 tablet by mouth daily. One-A-Day   Past Month at Unknown time  . potassium chloride (K-DUR) 10 MEQ tablet Take 1 tablet (10 mEq total) by mouth 2 (two) times daily. 180 tablet 3 12/23/2017 at Unknown time  .  rivaroxaban (XARELTO) 20 MG TABS tablet Take 1 tablet (20 mg total) by mouth daily with supper. 90 tablet 3 12/19/2017  . sacubitril-valsartan (ENTRESTO) 24-26 MG Take 1 tablet by mouth 2 (two) times daily. 180 tablet 3 12/23/2017  . sotalol (BETAPACE) 80 MG tablet TAKE 1 AND 1/2 TABLET BY MOUTH TWICE A DAY (Patient taking differently: Take 120 mg by mouth 2 (two) times daily. ) 270 tablet 2 12/24/2017 at 0400   . oxyCODONE-acetaminophen (PERCOCET) 7.5-325 MG tablet Take 1 tablet by mouth every 4 (four) hours as needed for severe pain. (Patient not taking: Reported on 12/13/2017) 42 tablet 0 Not Taking at Unknown time   Allergies:  Allergies  Allergen Reactions  . Aspirin Anaphylaxis and Hives  . Sulfa Antibiotics Anaphylaxis and Hives    Family History  Problem Relation Age of Onset  . Hypertension Mother   . Heart disease Mother   . Diabetes Mother   . Diabetes Father   . Hypertension Father   . Cancer Father   . Healthy Sister   . Heart attack Brother   . Heart disease Brother 37       + tobacco  . Healthy Brother    Social History:  reports that he has never smoked. He has never used smokeless tobacco. He reports that he does not drink alcohol or use drugs.  ROS: A complete review of systems was performed.  All systems are negative except for pertinent findings as noted. Review of Systems  All other systems reviewed and are negative.    Physical Exam:  Vital signs in last 24 hours: Temp:  [98.1 F (36.7 C)] 98.1 F (36.7 C) (11/12 0624) Pulse Rate:  [71] 71 (11/12 0624) Resp:  [16] 16 (11/12 0624) BP: (99)/(52) 99/52 (11/12 0624) SpO2:  [100 %] 100 % (11/12 0624) Weight:  [97.1 kg] 97.1 kg (11/12 0716) General:  Alert and oriented, No acute distress HEENT: Normocephalic, atraumatic Cardiovascular: Regular rate and rhythm Lungs: Regular rate and effort Abdomen: Soft, nontender, nondistended, no abdominal masses Back: No CVA tenderness Extremities: No edema, calf  pain or swelling  Neurologic: Grossly intact  Laboratory Data:  No results found for this or any previous visit (from the past 24 hour(s)). No results found for this or any previous visit (from the past 240 hour(s)). Creatinine: Recent Labs    12/18/17 0939  CREATININE 1.25*    Impression/Assessment:  Prostate cancer   Plan:  I discussed with the patient the nature, potential benefits, risks and alternatives to transrectal ultrasound/transperineal placement of gold seed fiducial markers and spaceoar biodegradable gel, including side effects of the proposed treatment, the likelihood of the patient achieving the goals of the procedure, and any potential problems that might occur during the procedure or recuperation. All questions answered. Patient elects to proceed.    Festus Aloe 12/24/2017, 7:48 AM

## 2017-12-25 ENCOUNTER — Encounter (HOSPITAL_COMMUNITY): Payer: Self-pay | Admitting: Urology

## 2017-12-27 ENCOUNTER — Ambulatory Visit
Admission: RE | Admit: 2017-12-27 | Discharge: 2017-12-27 | Disposition: A | Discharge status: Home / Self Care | Payer: Medicare HMO | Source: Ambulatory Visit | Attending: Radiation Oncology | Admitting: Radiation Oncology

## 2017-12-27 DIAGNOSIS — C61 Malignant neoplasm of prostate: Secondary | ICD-10-CM

## 2017-12-27 NOTE — Progress Notes (Signed)
  Radiation Oncology         [phone] ________________________________  Name: Jon Williams MRN: [id number]  Date: 12/27/2017  DOB: January 18, 1951  SIMULATION AND TREATMENT PLANNING NOTE    ICD-10-CM   1. Malignant neoplasm of prostate ([place]) C61     DIAGNOSIS:  67 y.o. gentleman with stage T2a adenocarcinoma of the prostate with a Gleason's score of 4+4 and a PSA of 6.73  NARRATIVE:  The patient was brought to the [facility].  Identity was confirmed.  All relevant records and images related to the planned course of therapy were reviewed.  The patient freely provided informed written consent to proceed with treatment after reviewing the details related to the planned course of therapy. The consent form was witnessed and verified by the simulation staff.  Then, the patient was set-up in a stable reproducible supine position for radiation therapy.  A vacuum lock pillow device was custom fabricated to position his legs in a reproducible immobilized position.  Then, I performed a urethrogram under sterile conditions to identify the prostatic apex.  CT images were obtained.  Surface markings were placed.  The CT images were loaded into the planning software.  Then the prostate target and avoidance structures including the rectum, bladder, bowel and hips were contoured.  Treatment planning then occurred.  The radiation prescription was entered and confirmed.  A total of one complex treatment devices were fabricated. I have requested : Intensity Modulated Radiotherapy (IMRT) is medically necessary for this case for the following reason:  Rectal sparing.Marland Kitchen  PLAN:  The patient will receive 45 Gy in 25 fractions of 1.8 Gy, followed by a boost to the prostate to a total dose of 75 Gy with 15 additional fractions of 2.0 Gy.  ________________________________  Sheral Apley Tammi Klippel, M.D.  This document serves as a record of services personally performed by Tyler Pita, MD. It was created  on his behalf by Wilburn Mylar, a trained medical scribe. The creation of this record is based on the scribe's personal observations and the provider's statements to them. This document has been checked and approved by the attending provider.

## 2018-01-07 DIAGNOSIS — C61 Malignant neoplasm of prostate: Secondary | ICD-10-CM | POA: Diagnosis not present

## 2018-01-13 ENCOUNTER — Ambulatory Visit: Admission: RE | Admit: 2018-01-13 | Payer: Medicare HMO | Source: Ambulatory Visit

## 2018-01-14 ENCOUNTER — Ambulatory Visit
Admission: RE | Admit: 2018-01-14 | Discharge: 2018-01-14 | Disposition: A | Discharge status: Home / Self Care | Payer: Medicare HMO | Source: Ambulatory Visit | Attending: Radiation Oncology | Admitting: Radiation Oncology

## 2018-01-14 ENCOUNTER — Encounter: Payer: Self-pay | Admitting: Medical Oncology

## 2018-01-14 DIAGNOSIS — C61 Malignant neoplasm of prostate: Secondary | ICD-10-CM | POA: Diagnosis not present

## 2018-01-15 ENCOUNTER — Ambulatory Visit
Admission: RE | Admit: 2018-01-15 | Discharge: 2018-01-15 | Disposition: A | Discharge status: Home / Self Care | Payer: Medicare HMO | Source: Ambulatory Visit | Attending: Radiation Oncology | Admitting: Radiation Oncology

## 2018-01-15 DIAGNOSIS — C61 Malignant neoplasm of prostate: Secondary | ICD-10-CM | POA: Diagnosis not present

## 2018-01-16 ENCOUNTER — Ambulatory Visit
Admission: RE | Admit: 2018-01-16 | Discharge: 2018-01-16 | Disposition: A | Discharge status: Home / Self Care | Payer: Medicare HMO | Source: Ambulatory Visit | Attending: Radiation Oncology | Admitting: Radiation Oncology

## 2018-01-16 DIAGNOSIS — C61 Malignant neoplasm of prostate: Secondary | ICD-10-CM | POA: Diagnosis not present

## 2018-01-17 ENCOUNTER — Ambulatory Visit
Admission: RE | Admit: 2018-01-17 | Discharge: 2018-01-17 | Disposition: A | Discharge status: Home / Self Care | Payer: Medicare HMO | Source: Ambulatory Visit | Attending: Radiation Oncology | Admitting: Radiation Oncology

## 2018-01-17 DIAGNOSIS — C61 Malignant neoplasm of prostate: Secondary | ICD-10-CM | POA: Diagnosis not present

## 2018-01-20 ENCOUNTER — Ambulatory Visit
Admission: RE | Admit: 2018-01-20 | Discharge: 2018-01-20 | Disposition: A | Discharge status: Home / Self Care | Payer: Medicare HMO | Source: Ambulatory Visit | Attending: Radiation Oncology | Admitting: Radiation Oncology

## 2018-01-20 DIAGNOSIS — C61 Malignant neoplasm of prostate: Secondary | ICD-10-CM | POA: Diagnosis not present

## 2018-01-21 ENCOUNTER — Ambulatory Visit
Admission: RE | Admit: 2018-01-21 | Discharge: 2018-01-21 | Disposition: A | Discharge status: Home / Self Care | Payer: Medicare HMO | Source: Ambulatory Visit | Attending: Radiation Oncology | Admitting: Radiation Oncology

## 2018-01-21 DIAGNOSIS — C61 Malignant neoplasm of prostate: Secondary | ICD-10-CM | POA: Diagnosis not present

## 2018-01-22 ENCOUNTER — Ambulatory Visit
Admission: RE | Admit: 2018-01-22 | Discharge: 2018-01-22 | Disposition: A | Discharge status: Home / Self Care | Payer: Medicare HMO | Source: Ambulatory Visit | Attending: Radiation Oncology | Admitting: Radiation Oncology

## 2018-01-22 DIAGNOSIS — C61 Malignant neoplasm of prostate: Secondary | ICD-10-CM | POA: Diagnosis not present

## 2018-01-23 ENCOUNTER — Ambulatory Visit
Admission: RE | Admit: 2018-01-23 | Discharge: 2018-01-23 | Disposition: A | Discharge status: Home / Self Care | Payer: Medicare HMO | Source: Ambulatory Visit | Attending: Radiation Oncology | Admitting: Radiation Oncology

## 2018-01-23 DIAGNOSIS — C61 Malignant neoplasm of prostate: Secondary | ICD-10-CM | POA: Diagnosis not present

## 2018-01-24 ENCOUNTER — Ambulatory Visit
Admission: RE | Admit: 2018-01-24 | Discharge: 2018-01-24 | Disposition: A | Discharge status: Home / Self Care | Payer: Medicare HMO | Source: Ambulatory Visit | Attending: Radiation Oncology | Admitting: Radiation Oncology

## 2018-01-24 DIAGNOSIS — C61 Malignant neoplasm of prostate: Secondary | ICD-10-CM | POA: Diagnosis not present

## 2018-01-27 ENCOUNTER — Ambulatory Visit
Admission: RE | Admit: 2018-01-27 | Discharge: 2018-01-27 | Disposition: A | Discharge status: Home / Self Care | Payer: Medicare HMO | Source: Ambulatory Visit | Attending: Radiation Oncology | Admitting: Radiation Oncology

## 2018-01-27 DIAGNOSIS — C61 Malignant neoplasm of prostate: Secondary | ICD-10-CM | POA: Diagnosis not present

## 2018-01-28 ENCOUNTER — Ambulatory Visit
Admission: RE | Admit: 2018-01-28 | Discharge: 2018-01-28 | Disposition: A | Discharge status: Home / Self Care | Payer: Medicare HMO | Source: Ambulatory Visit | Attending: Radiation Oncology | Admitting: Radiation Oncology

## 2018-01-28 DIAGNOSIS — C61 Malignant neoplasm of prostate: Secondary | ICD-10-CM | POA: Diagnosis not present

## 2018-01-29 ENCOUNTER — Ambulatory Visit
Admission: RE | Admit: 2018-01-29 | Discharge: 2018-01-29 | Disposition: A | Discharge status: Home / Self Care | Payer: Medicare HMO | Source: Ambulatory Visit | Attending: Radiation Oncology | Admitting: Radiation Oncology

## 2018-01-29 DIAGNOSIS — C61 Malignant neoplasm of prostate: Secondary | ICD-10-CM | POA: Diagnosis not present

## 2018-01-30 ENCOUNTER — Ambulatory Visit
Admission: RE | Admit: 2018-01-30 | Discharge: 2018-01-30 | Disposition: A | Discharge status: Home / Self Care | Payer: Medicare HMO | Source: Ambulatory Visit | Attending: Radiation Oncology | Admitting: Radiation Oncology

## 2018-01-30 DIAGNOSIS — M25561 Pain in right knee: Secondary | ICD-10-CM | POA: Diagnosis not present

## 2018-01-30 DIAGNOSIS — C61 Malignant neoplasm of prostate: Secondary | ICD-10-CM | POA: Diagnosis not present

## 2018-01-31 ENCOUNTER — Ambulatory Visit
Admission: RE | Admit: 2018-01-31 | Discharge: 2018-01-31 | Disposition: A | Discharge status: Home / Self Care | Payer: Medicare HMO | Source: Ambulatory Visit | Attending: Radiation Oncology | Admitting: Radiation Oncology

## 2018-01-31 ENCOUNTER — Ambulatory Visit: Payer: Medicare HMO | Admitting: Podiatry

## 2018-01-31 DIAGNOSIS — C61 Malignant neoplasm of prostate: Secondary | ICD-10-CM | POA: Diagnosis not present

## 2018-02-03 ENCOUNTER — Ambulatory Visit
Admission: RE | Admit: 2018-02-03 | Discharge: 2018-02-03 | Disposition: A | Discharge status: Home / Self Care | Payer: Medicare HMO | Source: Ambulatory Visit | Attending: Radiation Oncology | Admitting: Radiation Oncology

## 2018-02-03 DIAGNOSIS — C61 Malignant neoplasm of prostate: Secondary | ICD-10-CM | POA: Diagnosis not present

## 2018-02-04 ENCOUNTER — Ambulatory Visit
Admission: RE | Admit: 2018-02-04 | Discharge: 2018-02-04 | Disposition: A | Discharge status: Home / Self Care | Payer: Medicare HMO | Source: Ambulatory Visit | Attending: Radiation Oncology | Admitting: Radiation Oncology

## 2018-02-04 DIAGNOSIS — C61 Malignant neoplasm of prostate: Secondary | ICD-10-CM | POA: Diagnosis not present

## 2018-02-04 DIAGNOSIS — B351 Tinea unguium: Secondary | ICD-10-CM | POA: Diagnosis not present

## 2018-02-04 DIAGNOSIS — M79675 Pain in left toe(s): Secondary | ICD-10-CM | POA: Diagnosis not present

## 2018-02-04 DIAGNOSIS — M79674 Pain in right toe(s): Secondary | ICD-10-CM | POA: Diagnosis not present

## 2018-02-04 DIAGNOSIS — D689 Coagulation defect, unspecified: Secondary | ICD-10-CM | POA: Diagnosis not present

## 2018-02-06 ENCOUNTER — Ambulatory Visit
Admission: RE | Admit: 2018-02-06 | Discharge: 2018-02-06 | Disposition: A | Discharge status: Home / Self Care | Payer: Medicare HMO | Source: Ambulatory Visit | Attending: Radiation Oncology | Admitting: Radiation Oncology

## 2018-02-06 ENCOUNTER — Encounter: Payer: Self-pay | Admitting: Podiatry

## 2018-02-06 ENCOUNTER — Ambulatory Visit: Payer: Medicare HMO | Admitting: Podiatry

## 2018-02-06 DIAGNOSIS — M79675 Pain in left toe(s): Secondary | ICD-10-CM

## 2018-02-06 DIAGNOSIS — B351 Tinea unguium: Secondary | ICD-10-CM

## 2018-02-06 DIAGNOSIS — M79674 Pain in right toe(s): Secondary | ICD-10-CM

## 2018-02-06 DIAGNOSIS — C61 Malignant neoplasm of prostate: Secondary | ICD-10-CM | POA: Diagnosis not present

## 2018-02-06 DIAGNOSIS — D689 Coagulation defect, unspecified: Secondary | ICD-10-CM

## 2018-02-06 NOTE — Progress Notes (Signed)
Patient ID: Jon Williams, male   DOB: 1951-01-13, 67 y.o.   MRN: [id number] Complaint:  Visit Type: Patient returns to my office for continued preventative foot care services. Complaint: Patient states" my nails have grown long and thick and become painful to walk and wear shoes" . The patient presents for preventative foot care services. No changes to ROS.    Callus has resolved.  Podiatric Exam: Vascular: dorsalis pedis and posterior tibial pulses are palpable bilateral. Capillary return is immediate. Temperature gradient is WNL. Skin turgor WNL  Sensorium: Normal Semmes Weinstein monofilament test. Normal tactile sensation bilaterally. Nail Exam: Pt has thick disfigured discolored nails with subungual debris noted bilateral entire nail hallux through fifth toenails Ulcer Exam: There is no evidence of ulcer or pre-ulcerative changes or infection. Orthopedic Exam: Muscle tone and strength are WNL. No limitations in general ROM. No crepitus or effusions noted. Foot type and digits show no abnormalities. Bony prominences are unremarkable. 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 Skin: No Porokeratosis. No infection or ulcer.     Diagnosis:  Onychomycosis, , Pain in right toe, pain in left toes     Gardiner Barefoot DPM Treatment & Plan Procedures and Treatment: Consent by patient was obtained for treatment procedures. The patient understood the discussion of treatment and procedures well. All questions were answered thoroughly reviewed. Debridement of mycotic and hypertrophic toenails, 1 through 5 bilateral and clearing of subungual debris. No ulceration, no infection noted.   Return Visit-Office Procedure: Patient instructed to return to the office for a follow up visit 12  weeks. for continued evaluation and treatment.   Gardiner Barefoot DPM

## 2018-02-07 ENCOUNTER — Encounter: Payer: Self-pay | Admitting: Medical Oncology

## 2018-02-07 ENCOUNTER — Ambulatory Visit
Admission: RE | Admit: 2018-02-07 | Discharge: 2018-02-07 | Disposition: A | Discharge status: Home / Self Care | Payer: Medicare HMO | Source: Ambulatory Visit | Attending: Radiation Oncology | Admitting: Radiation Oncology

## 2018-02-07 ENCOUNTER — Ambulatory Visit: Payer: Medicare HMO | Admitting: Podiatry

## 2018-02-07 DIAGNOSIS — C61 Malignant neoplasm of prostate: Secondary | ICD-10-CM | POA: Diagnosis not present

## 2018-02-10 ENCOUNTER — Ambulatory Visit
Admission: RE | Admit: 2018-02-10 | Discharge: 2018-02-10 | Disposition: A | Discharge status: Home / Self Care | Payer: Medicare HMO | Source: Ambulatory Visit | Attending: Radiation Oncology | Admitting: Radiation Oncology

## 2018-02-10 ENCOUNTER — Ambulatory Visit: Payer: Medicare HMO

## 2018-02-10 DIAGNOSIS — C61 Malignant neoplasm of prostate: Secondary | ICD-10-CM | POA: Diagnosis not present

## 2018-02-11 ENCOUNTER — Ambulatory Visit
Admission: RE | Admit: 2018-02-11 | Discharge: 2018-02-11 | Disposition: A | Discharge status: Home / Self Care | Payer: Medicare HMO | Source: Ambulatory Visit | Attending: Radiation Oncology | Admitting: Radiation Oncology

## 2018-02-11 DIAGNOSIS — C61 Malignant neoplasm of prostate: Secondary | ICD-10-CM | POA: Diagnosis not present

## 2018-02-13 ENCOUNTER — Ambulatory Visit
Admission: RE | Admit: 2018-02-13 | Discharge: 2018-02-13 | Disposition: A | Discharge status: Home / Self Care | Payer: Medicare HMO | Source: Ambulatory Visit | Attending: Radiation Oncology | Admitting: Radiation Oncology

## 2018-02-13 ENCOUNTER — Ambulatory Visit (INDEPENDENT_AMBULATORY_CARE_PROVIDER_SITE_OTHER): Payer: Medicare HMO

## 2018-02-13 DIAGNOSIS — I428 Other cardiomyopathies: Secondary | ICD-10-CM | POA: Diagnosis not present

## 2018-02-13 DIAGNOSIS — C61 Malignant neoplasm of prostate: Secondary | ICD-10-CM | POA: Diagnosis not present

## 2018-02-13 LAB — CUP PACEART REMOTE DEVICE CHECK
Battery Remaining Longevity: 58 mo
Battery Remaining Percentage: 72 %
Battery Voltage: 2.96 V
Brady Statistic AP VP Percent: 88 %
Brady Statistic AP VS Percent: 1 %
Brady Statistic AS VP Percent: 11 %
Brady Statistic AS VS Percent: 1 %
Brady Statistic RA Percent Paced: 7.4 %
Date Time Interrogation Session: 20200101074209
HighPow Impedance: 77 Ohm
HighPow Impedance: 77 Ohm
Implantable Lead Implant Date: 20180413
Implantable Lead Implant Date: 20180413
Implantable Lead Implant Date: 20180413
Implantable Lead Location: 753858
Implantable Lead Location: 753859
Implantable Lead Location: 753860
Implantable Lead Model: 7122
Implantable Pulse Generator Implant Date: 20180413
Lead Channel Impedance Value: 390 Ohm
Lead Channel Impedance Value: 400 Ohm
Lead Channel Impedance Value: 690 Ohm
Lead Channel Pacing Threshold Amplitude: 0.75 V
Lead Channel Pacing Threshold Amplitude: 0.75 V
Lead Channel Pacing Threshold Amplitude: 1 V
Lead Channel Pacing Threshold Pulse Width: 0.5 ms
Lead Channel Pacing Threshold Pulse Width: 0.5 ms
Lead Channel Pacing Threshold Pulse Width: 0.5 ms
Lead Channel Sensing Intrinsic Amplitude: 0.3 mV
Lead Channel Sensing Intrinsic Amplitude: 8.4 mV
Lead Channel Setting Pacing Amplitude: 2 V
Lead Channel Setting Pacing Amplitude: 2.5 V
Lead Channel Setting Pacing Amplitude: 2.5 V
Lead Channel Setting Pacing Pulse Width: 0.5 ms
Lead Channel Setting Pacing Pulse Width: 0.5 ms
Lead Channel Setting Sensing Sensitivity: 0.5 mV
Pulse Gen Serial Number: 7398151

## 2018-02-13 NOTE — Progress Notes (Signed)
Remote ICD transmission.   

## 2018-02-14 ENCOUNTER — Ambulatory Visit
Admission: RE | Admit: 2018-02-14 | Discharge: 2018-02-14 | Disposition: A | Discharge status: Home / Self Care | Payer: Medicare HMO | Source: Ambulatory Visit | Attending: Radiation Oncology | Admitting: Radiation Oncology

## 2018-02-14 DIAGNOSIS — C61 Malignant neoplasm of prostate: Secondary | ICD-10-CM | POA: Diagnosis not present

## 2018-02-17 ENCOUNTER — Ambulatory Visit
Admission: RE | Admit: 2018-02-17 | Discharge: 2018-02-17 | Disposition: A | Discharge status: Home / Self Care | Payer: Medicare HMO | Source: Ambulatory Visit | Attending: Radiation Oncology | Admitting: Radiation Oncology

## 2018-02-17 DIAGNOSIS — C61 Malignant neoplasm of prostate: Secondary | ICD-10-CM | POA: Diagnosis not present

## 2018-02-18 ENCOUNTER — Ambulatory Visit
Admission: RE | Admit: 2018-02-18 | Discharge: 2018-02-18 | Disposition: A | Discharge status: Home / Self Care | Payer: Medicare HMO | Source: Ambulatory Visit | Attending: Radiation Oncology | Admitting: Radiation Oncology

## 2018-02-18 DIAGNOSIS — C61 Malignant neoplasm of prostate: Secondary | ICD-10-CM | POA: Diagnosis not present

## 2018-02-19 ENCOUNTER — Ambulatory Visit
Admission: RE | Admit: 2018-02-19 | Discharge: 2018-02-19 | Disposition: A | Discharge status: Home / Self Care | Payer: Medicare HMO | Source: Ambulatory Visit | Attending: Radiation Oncology | Admitting: Radiation Oncology

## 2018-02-19 DIAGNOSIS — C61 Malignant neoplasm of prostate: Secondary | ICD-10-CM | POA: Diagnosis not present

## 2018-02-20 ENCOUNTER — Ambulatory Visit
Admission: RE | Admit: 2018-02-20 | Discharge: 2018-02-20 | Disposition: A | Discharge status: Home / Self Care | Payer: Medicare HMO | Source: Ambulatory Visit | Attending: Radiation Oncology | Admitting: Radiation Oncology

## 2018-02-20 DIAGNOSIS — C61 Malignant neoplasm of prostate: Secondary | ICD-10-CM | POA: Diagnosis not present

## 2018-02-21 ENCOUNTER — Ambulatory Visit
Admission: RE | Admit: 2018-02-21 | Discharge: 2018-02-21 | Disposition: A | Discharge status: Home / Self Care | Payer: Medicare HMO | Source: Ambulatory Visit | Attending: Radiation Oncology | Admitting: Radiation Oncology

## 2018-02-21 DIAGNOSIS — C61 Malignant neoplasm of prostate: Secondary | ICD-10-CM | POA: Diagnosis not present

## 2018-02-24 ENCOUNTER — Ambulatory Visit
Admission: RE | Admit: 2018-02-24 | Discharge: 2018-02-24 | Disposition: A | Discharge status: Home / Self Care | Payer: Medicare HMO | Source: Ambulatory Visit | Attending: Radiation Oncology | Admitting: Radiation Oncology

## 2018-02-24 DIAGNOSIS — C61 Malignant neoplasm of prostate: Secondary | ICD-10-CM | POA: Diagnosis not present

## 2018-02-25 ENCOUNTER — Ambulatory Visit
Admission: RE | Admit: 2018-02-25 | Discharge: 2018-02-25 | Disposition: A | Discharge status: Home / Self Care | Payer: Medicare HMO | Source: Ambulatory Visit | Attending: Radiation Oncology | Admitting: Radiation Oncology

## 2018-02-25 DIAGNOSIS — C61 Malignant neoplasm of prostate: Secondary | ICD-10-CM | POA: Diagnosis not present

## 2018-02-26 ENCOUNTER — Ambulatory Visit
Admission: RE | Admit: 2018-02-26 | Discharge: 2018-02-26 | Disposition: A | Discharge status: Home / Self Care | Payer: Medicare HMO | Source: Ambulatory Visit | Attending: Radiation Oncology | Admitting: Radiation Oncology

## 2018-02-26 DIAGNOSIS — C61 Malignant neoplasm of prostate: Secondary | ICD-10-CM | POA: Diagnosis not present

## 2018-02-27 ENCOUNTER — Ambulatory Visit
Admission: RE | Admit: 2018-02-27 | Discharge: 2018-02-27 | Disposition: A | Discharge status: Home / Self Care | Payer: Medicare HMO | Source: Ambulatory Visit | Attending: Radiation Oncology | Admitting: Radiation Oncology

## 2018-02-27 DIAGNOSIS — C61 Malignant neoplasm of prostate: Secondary | ICD-10-CM | POA: Diagnosis not present

## 2018-02-28 ENCOUNTER — Ambulatory Visit
Admission: RE | Admit: 2018-02-28 | Discharge: 2018-02-28 | Disposition: A | Discharge status: Home / Self Care | Payer: Medicare HMO | Source: Ambulatory Visit | Attending: Radiation Oncology | Admitting: Radiation Oncology

## 2018-02-28 DIAGNOSIS — C61 Malignant neoplasm of prostate: Secondary | ICD-10-CM | POA: Diagnosis not present

## 2018-03-03 ENCOUNTER — Ambulatory Visit
Admission: RE | Admit: 2018-03-03 | Discharge: 2018-03-03 | Disposition: A | Discharge status: Home / Self Care | Payer: Medicare HMO | Source: Ambulatory Visit | Attending: Radiation Oncology | Admitting: Radiation Oncology

## 2018-03-03 DIAGNOSIS — C61 Malignant neoplasm of prostate: Secondary | ICD-10-CM | POA: Diagnosis not present

## 2018-03-04 ENCOUNTER — Ambulatory Visit
Admission: RE | Admit: 2018-03-04 | Discharge: 2018-03-04 | Disposition: A | Discharge status: Home / Self Care | Payer: Medicare HMO | Source: Ambulatory Visit | Attending: Radiation Oncology | Admitting: Radiation Oncology

## 2018-03-04 DIAGNOSIS — C61 Malignant neoplasm of prostate: Secondary | ICD-10-CM | POA: Diagnosis not present

## 2018-03-05 ENCOUNTER — Ambulatory Visit
Admission: RE | Admit: 2018-03-05 | Discharge: 2018-03-05 | Disposition: A | Discharge status: Home / Self Care | Payer: Medicare HMO | Source: Ambulatory Visit | Attending: Radiation Oncology | Admitting: Radiation Oncology

## 2018-03-05 DIAGNOSIS — C61 Malignant neoplasm of prostate: Secondary | ICD-10-CM | POA: Diagnosis not present

## 2018-03-06 ENCOUNTER — Ambulatory Visit
Admission: RE | Admit: 2018-03-06 | Discharge: 2018-03-06 | Disposition: A | Discharge status: Home / Self Care | Payer: Medicare HMO | Source: Ambulatory Visit | Attending: Radiation Oncology | Admitting: Radiation Oncology

## 2018-03-06 ENCOUNTER — Encounter: Payer: Self-pay | Admitting: Internal Medicine

## 2018-03-06 DIAGNOSIS — C61 Malignant neoplasm of prostate: Secondary | ICD-10-CM | POA: Diagnosis not present

## 2018-03-07 ENCOUNTER — Ambulatory Visit
Admission: RE | Admit: 2018-03-07 | Discharge: 2018-03-07 | Disposition: A | Discharge status: Home / Self Care | Payer: Medicare HMO | Source: Ambulatory Visit | Attending: Radiation Oncology | Admitting: Radiation Oncology

## 2018-03-07 DIAGNOSIS — C61 Malignant neoplasm of prostate: Secondary | ICD-10-CM | POA: Diagnosis not present

## 2018-03-10 ENCOUNTER — Ambulatory Visit
Admission: RE | Admit: 2018-03-10 | Discharge: 2018-03-10 | Disposition: A | Discharge status: Home / Self Care | Payer: Medicare HMO | Source: Ambulatory Visit | Attending: Radiation Oncology | Admitting: Radiation Oncology

## 2018-03-10 DIAGNOSIS — C61 Malignant neoplasm of prostate: Secondary | ICD-10-CM | POA: Diagnosis not present

## 2018-03-11 ENCOUNTER — Ambulatory Visit: Payer: Medicare HMO

## 2018-03-11 ENCOUNTER — Ambulatory Visit
Admission: RE | Admit: 2018-03-11 | Discharge: 2018-03-11 | Disposition: A | Discharge status: Home / Self Care | Payer: Medicare HMO | Source: Ambulatory Visit | Attending: Radiation Oncology | Admitting: Radiation Oncology

## 2018-03-11 DIAGNOSIS — C61 Malignant neoplasm of prostate: Secondary | ICD-10-CM | POA: Diagnosis not present

## 2018-03-12 ENCOUNTER — Ambulatory Visit: Payer: Medicare HMO

## 2018-03-25 ENCOUNTER — Encounter: Payer: Self-pay | Admitting: Internal Medicine

## 2018-03-25 ENCOUNTER — Ambulatory Visit: Payer: Medicare HMO | Admitting: Internal Medicine

## 2018-03-25 ENCOUNTER — Ambulatory Visit
Admission: RE | Admit: 2018-03-25 | Discharge: 2018-03-25 | Disposition: A | Discharge status: Home / Self Care | Payer: Medicare HMO | Source: Ambulatory Visit | Attending: Internal Medicine | Admitting: Internal Medicine

## 2018-03-25 VITALS — BP 126/76 | HR 74 | Ht 66.0 in | Wt 214.0 lb

## 2018-03-25 DIAGNOSIS — Z95810 Presence of automatic (implantable) cardiac defibrillator: Secondary | ICD-10-CM

## 2018-03-25 DIAGNOSIS — I5030 Unspecified diastolic (congestive) heart failure: Secondary | ICD-10-CM | POA: Diagnosis not present

## 2018-03-25 DIAGNOSIS — I5042 Chronic combined systolic (congestive) and diastolic (congestive) heart failure: Secondary | ICD-10-CM

## 2018-03-25 DIAGNOSIS — I4819 Other persistent atrial fibrillation: Secondary | ICD-10-CM | POA: Diagnosis not present

## 2018-03-25 LAB — CUP PACEART INCLINIC DEVICE CHECK
Battery Remaining Longevity: 64 mo
Brady Statistic RA Percent Paced: 6.8 %
Brady Statistic RV Percent Paced: 96 %
Date Time Interrogation Session: 20200211125953
HighPow Impedance: 65.25 Ohm
Implantable Lead Implant Date: 20180413
Implantable Lead Implant Date: 20180413
Implantable Lead Implant Date: 20180413
Implantable Lead Location: 753858
Implantable Lead Location: 753859
Implantable Lead Location: 753860
Implantable Lead Model: 7122
Implantable Pulse Generator Implant Date: 20180413
Lead Channel Impedance Value: 400 Ohm
Lead Channel Impedance Value: 450 Ohm
Lead Channel Impedance Value: 762.5 Ohm
Lead Channel Pacing Threshold Amplitude: 0.75 V
Lead Channel Pacing Threshold Amplitude: 0.75 V
Lead Channel Pacing Threshold Amplitude: 5 V
Lead Channel Pacing Threshold Amplitude: 5 V
Lead Channel Pacing Threshold Pulse Width: 0.5 ms
Lead Channel Pacing Threshold Pulse Width: 0.5 ms
Lead Channel Pacing Threshold Pulse Width: 1 ms
Lead Channel Pacing Threshold Pulse Width: 1 ms
Lead Channel Sensing Intrinsic Amplitude: 0.7 mV
Lead Channel Sensing Intrinsic Amplitude: 5.9 mV
Lead Channel Setting Pacing Amplitude: 2 V
Lead Channel Setting Pacing Amplitude: 2.5 V
Lead Channel Setting Pacing Pulse Width: 0.5 ms
Lead Channel Setting Sensing Sensitivity: 0.5 mV
Pulse Gen Serial Number: 7398151

## 2018-03-25 NOTE — Progress Notes (Signed)
HPI [name] returns today for followup of his atrial fib and CHF, s/p ICD insertion. He is a pleasant 68 yo man with the above problems, who underwent biv ICD insertion almost 2 years ago. He has prostate CA. He has not had syncope. No ICD shocks. He denies chest pain or sob. No syncope. No palpitations. Allergies  Allergen Reactions  . Aspirin Anaphylaxis and Hives  . Sulfa Antibiotics Anaphylaxis and Hives     Current Outpatient Medications  Medication Sig Dispense Refill  . atorvastatin (LIPITOR) 40 MG tablet Take 1 tablet (40 mg total) by mouth daily. 90 tablet 3  . carvedilol (COREG) 3.125 MG tablet Take 1 tablet (3.125 mg total) by mouth 2 (two) times daily. 180 tablet 3  . furosemide (LASIX) 40 MG tablet TAKE 1 TABLET BY MOUTH TWICE A DAY 180 tablet 3  . Multiple Vitamin (MULTIVITAMIN WITH MINERALS) TABS tablet Take 1 tablet by mouth daily. One-A-Day    . potassium chloride (K-DUR) 10 MEQ tablet Take 1 tablet (10 mEq total) by mouth 2 (two) times daily. 180 tablet 3  . rivaroxaban (XARELTO) 20 MG TABS tablet Take 1 tablet (20 mg total) by mouth daily with supper. 90 tablet 3  . sacubitril-valsartan (ENTRESTO) 24-26 MG Take 1 tablet by mouth 2 (two) times daily. 180 tablet 3  . sotalol (BETAPACE) 80 MG tablet TAKE 1 AND 1/2 TABLET BY MOUTH TWICE A DAY 270 tablet 2   No current facility-administered medications for this visit.      Past Medical History:  Diagnosis Date  . AICD (automatic cardioverter/defibrillator) present 05/25/2016   biv icd  . Bunion, right   . Chronic combined systolic and diastolic CHF (congestive heart failure) ([place])    Echo 1/18: Mild conc LVH, EF 15-20, severe diff HK, inf and inf-septal AK, Gr 3 DD, mild to mod MR, severe LAE, mod reduced RVSF, mod RAE, mild TR, PASP 50  . Coronary artery disease involving native coronary artery without angina pectoris 04/17/2016   LHC 1/18: pLCx 30, mLCx 20, mRCA 40, dRCA 20, LVEDP 23, mean RA 8, PA 42/20,  PCWP 17  . DJD (degenerative joint disease)   . History of atrial fibrillation   . History of atrial flutter   . History of cardiomegaly 06/07/2016   Noted on CXR  . History of colon polyps 06/28/2017   Noted on colonoscopy  . LBBB (left bundle branch block)   . NICM (nonischemic cardiomyopathy) ([place])    Echo 1/18:  Mild conc LVH, EF 15-20, severe diff HK, inf and inf-septal AK, Gr 3 DD, mild to mod MR, severe LAE, mod reduced RVSF, mod RAE, mild TR, PASP 50  . OA (osteoarthritis)    knee  . Other secondary pulmonary hypertension ([place]) 04/17/2016  . Prostate cancer ([place]) 2019  . Sigmoid diverticulosis 06/28/2017   Noted on colonoscopy    ROS:   All systems reviewed and negative except as noted in the HPI.   Past Surgical History:  Procedure Laterality Date  . BIV ICD INSERTION CRT-D N/A 05/25/2016   Procedure: BiV ICD Insertion CRT-D;  Surgeon: [name], MD;  Location: [place] [facility];  Service: Cardiovascular;  Laterality: N/A;  . CARDIAC CATHETERIZATION N/A 03/02/2016   Procedure: Right/Left Heart Cath and Coronary Angiography;  Surgeon: Nelva Bush, MD;  Location: [place] [place] [facility];  Service: Cardiovascular;  Laterality: N/A;  . CARDIOVERSION N/A 07/17/2016   Procedure: Cardioversion;  Surgeon: [name],  MD;  Location: [facility];  Service: Cardiovascular;  Laterality: N/A;  . COLONOSCOPY WITH PROPOFOL N/A 06/28/2017   Procedure: COLONOSCOPY WITH PROPOFOL;  Surgeon: [name], MD;  Location: [facility];  Service: Endoscopy;  Laterality: N/A;  . colonscopy  2009  . GOLD SEED IMPLANT N/A 12/24/2017   Procedure: GOLD SEED IMPLANT, TRANSERINEAL;  Surgeon: Festus Aloe, MD;  Location: [facility];  Service: Urology;  Laterality: N/A;  . INSERT / REPLACE / REMOVE PACEMAKER    . POLYPECTOMY  06/28/2017   Procedure: POLYPECTOMY;  Surgeon: [name], MD;  Location: [facility];  Service: Endoscopy;;  ascending and descending colon polyp  . PROSTATE  BIOPSY  02/20/2017  . SPACE OAR INSTILLATION N/A 12/24/2017   Procedure: SPACE OAR INSTILLATION;  Surgeon: Festus Aloe, MD;  Location: [facility];  Service: Urology;  Laterality: N/A;  . TOTAL KNEE ARTHROPLASTY Right 08/16/2017   Procedure: RIGHT TOTAL KNEE ARTHROPLASTY;  Surgeon: [name], MD;  Location: [facility];  Service: Orthopedics;  Laterality: Right;     Family History  Problem Relation Age of Onset  . Hypertension Mother   . Heart disease Mother   . Diabetes Mother   . Diabetes Father   . Hypertension Father   . Cancer Father   . Healthy Sister   . Heart attack Brother   . Heart disease Brother 55       + tobacco  . Healthy Brother      Social History   Socioeconomic History  . Marital status: Married    Spouse name: Not on file  . Number of children: Not on file  . Years of education: Not on file  . Highest education level: Not on file  Occupational History  . Not on file  Social Needs  . Financial resource strain: Not on file  . Food insecurity:    Worry: Not on file    Inability: Not on file  . Transportation needs:    Medical: Not on file    Non-medical: Not on file  Tobacco Use  . Smoking status: Never Smoker  . Smokeless tobacco: Never Used  Substance and Sexual Activity  . Alcohol use: No  . Drug use: No  . Sexual activity: Not Currently  Lifestyle  . Physical activity:    Days per week: Not on file    Minutes per session: Not on file  . Stress: Not on file  Relationships  . Social connections:    Talks on phone: Not on file    Gets together: Not on file    Attends religious service: Not on file    Active member of club or organization: Not on file    Attends meetings of clubs or organizations: Not on file    Relationship status: Not on file  . Intimate partner violence:    Fear of current or ex partner: Not on file    Emotionally abused: Not on file    Physically abused: Not on file    Forced sexual activity: Not on file  Other  Topics Concern  . Not on file  Social History Narrative   Retired Glass blower/designer. Married to Mrs. Jon Williams. Daughter, Jon Williams, lives in [place]. Son, Jon Williams, lives in [place].     BP 126/76   Pulse 74   Ht 5\' 6"  (1.676 m)   Wt 214 lb (97.1 kg)   BMI 34.54 kg/m   Physical Exam:  Well appearing NAD HEENT: Unremarkable Neck:  No  JVD, no thyromegally Lymphatics:  No adenopathy Back:  No CVA tenderness Lungs:  Clear HEART:  Regular rate rhythm, no murmurs, no rubs, no clicks Abd:  soft, positive bowel sounds, no organomegally, no rebound, no guarding Ext:  2 plus pulses, no edema, no cyanosis, no clubbing Skin:  No rashes no nodules Neuro:  CN II through XII intact, motor grossly intact  EKG - atrial flutter with a controlled VR  DEVICE  Normal device function.  See PaceArt for details.   Assess/Plan: 1. Atrial fib/flutter - his rates are controlled. We discussed DCCV but because he is asymptomatic, we will hold off on this. 2. ICD - his [employer] Biv ICD demonstrates  increased LV threshold and some undersensing of his atrial activity and we reprogrammed him DDIR. 3. Chronic systolic heart failure - his symptoms are class 2. He will continue his current meds, exercise and maintain a low sodium diet. 4. Coags - he will continue Xarelto. 5. Possible LV lead dislodgement - I do not see any LV capture today and his more proximal vectors show no electrical activity suggesting that he has had dislodgement of his LV lead. We will have him obtain a CXR.   Mikle Bosworth.D.

## 2018-03-25 NOTE — Patient Instructions (Addendum)
Medication Instructions:  Your physician recommends that you continue on your current medications as directed. Please refer to the Current Medication list given to you today.  Labwork: None ordered.  Testing/Procedures: A chest x-ray takes a picture of the organs and structures inside the chest, including the heart, lungs, and blood vessels. This test can show several things, including, whether the heart is enlarges; whether fluid is building up in the lungs; and whether pacemaker / defibrillator leads are still in place.  Please go to [facility] for a chest xray.  [facility] wants you to follow-up in: based on results of your chest xray with Dr. Lovena Le.       Remote monitoring is used to monitor your ICD from home. This monitoring reduces the number of office visits required to check your device to one time per year. It allows [place] to keep an eye on the functioning of your device to ensure it is working properly. You are scheduled for a device check from home on 05/15/2018. You may send your transmission at any time that day. If you have a wireless device, the transmission will be sent automatically. After your physician reviews your transmission, you will receive a postcard with your next transmission date.  Any Other Special Instructions Will Be Listed Below (If Applicable).  If you need a refill on your cardiac medications before your next appointment, please call your pharmacy.

## 2018-03-28 ENCOUNTER — Encounter: Payer: Self-pay | Admitting: Radiation Oncology

## 2018-03-28 NOTE — Progress Notes (Signed)
  Radiation Oncology         [phone] ________________________________  Name: Jon Williams MRN: [id number]  Date: 03/28/2018  DOB: 06/18/1950  End of Treatment Note  Diagnosis:   68 y.o. gentleman with Stage T2a adenocarcinoma of the prostate with a Gleason's score of 4+4 and a PSA of 6.73     Indication for treatment:  Curative, Definitive Radiotherapy       Radiation treatment dates:   01/14/2018 - 03/11/2018  Site/dose:  1. The prostate, seminal vesicles, and pelvic lymph nodes were initially treated to 45 Gy in 25 fractions of 1.8 Gy  2. The prostate only was boosted to 75 Gy with 15 additional fractions of 2.0 Gy   Beams/energy:  1. The prostate, seminal vesicles, and pelvic lymph nodes were initially treated using helical intensity modulated radiotherapy delivering 6 megavolt photons. Image guidance was performed with megavoltage CT studies prior to each fraction. He was immobilized with a body fix lower extremity mold.  2. the prostate only was boosted using helical intensity modulated radiotherapy delivering 6 megavolt photons. Image guidance was performed with megavoltage CT studies prior to each fraction. He was immobilized with a body fix lower extremity mold.  Narrative: The patient tolerated radiation treatment relatively well. He stated he continued to work out daily. He denied hematuria, issues emptying his bladder, leakage, stream issues, and fatigue throughout treatment. He reported minimal dysuria, urgency, and diarrhea, with one instance of constipation. He reported nocturia x5 at baseline that improved to x3-4.  Plan: The patient has completed radiation treatment. He will return to radiation oncology clinic for routine followup in one month. I advised him to call or return sooner if he has any questions or concerns related to his recovery or treatment. ________________________________  Sheral Apley. Tammi Klippel, M.D.  This document serves as a record of services  personally performed by Tyler Pita, MD. It was created on his behalf by Wilburn Mylar, a trained medical scribe. The creation of this record is based on the scribe's personal observations and the provider's statements to them. This document has been checked and approved by the attending provider.

## 2018-04-01 ENCOUNTER — Telehealth: Payer: Self-pay | Admitting: Internal Medicine

## 2018-04-01 NOTE — Telephone Encounter (Signed)
New Message   Patient is calling to obtain results of his chest xray that he had a [facility]. Please call.

## 2018-04-01 NOTE — Telephone Encounter (Signed)
Will forward to Dr. Lovena Le to get results.

## 2018-04-02 NOTE — Telephone Encounter (Signed)
Left detailed message per DPR.  Advised per Dr. Lovena Le Pt would need a lead extraction some time in the future.  Pt reprogrammed at last visit.  Requested Pt call this nurse back to give update on how he is currently feeling with reprogramming.

## 2018-04-05 ENCOUNTER — Other Ambulatory Visit: Payer: Self-pay | Admitting: Internal Medicine

## 2018-04-07 NOTE — Telephone Encounter (Signed)
Pt last saw Dr Lovena Le 03/25/18, last labs 12/18/17 Creat 1.25, age 68, weight 97.1kg, CrCl 76.6, based on CrCl pt is on appropriate dosage of Xarelto 20mg  QD. Will refill rx

## 2018-04-08 NOTE — Telephone Encounter (Signed)
His LV lead has dislodged and will need a lead revision. As long as he is ok there is no rush on getting this done but I think he should come back to see me in 3-4 weeks. GT

## 2018-04-10 ENCOUNTER — Other Ambulatory Visit: Payer: Self-pay

## 2018-04-10 ENCOUNTER — Ambulatory Visit
Admission: RE | Admit: 2018-04-10 | Discharge: 2018-04-10 | Disposition: A | Discharge status: Home / Self Care | Payer: Medicare HMO | Source: Ambulatory Visit | Attending: Radiation Oncology | Admitting: Radiation Oncology

## 2018-04-10 ENCOUNTER — Encounter: Payer: Self-pay | Admitting: Urology

## 2018-04-10 VITALS — BP 116/57 | HR 59 | Temp 97.5°F | Resp 20 | Ht 66.0 in | Wt 213.0 lb

## 2018-04-10 DIAGNOSIS — C61 Malignant neoplasm of prostate: Secondary | ICD-10-CM | POA: Diagnosis not present

## 2018-04-10 NOTE — Addendum Note (Signed)
Encounter addended by: Rico Sheehan, LPN on: 9/68/8648 4:72 PM  Actions taken: Charge Capture section accepted

## 2018-04-10 NOTE — Progress Notes (Signed)
Radiation Oncology         ([phone]) [phone] ________________________________  Name: Jon Williams MRN: [id number]  Date: 04/10/2018  DOB: 19-Oct-1950  Post Treatment Note  CC: [name], MD  Raynelle Bring, MD  Diagnosis:   68 y.o. gentleman with Stage T2a adenocarcinoma of the prostate with a Gleason's score of 4+4 and a PSA of 6.73    Interval Since Last Radiation:  4 weeks  01/14/2018 - 03/11/2018: 1. The prostate, seminal vesicles, and pelvic lymph nodes were initially treated to 45 Gy in 25 fractions of 1.8 Gy  2. The prostate only was boosted to 75 Gy with 15 additional fractions of 2.0 Gy   Narrative:  The patient returns today for routine follow-up.  He tolerated radiation treatment relatively well and was able to continue to work out daily. He denied hematuria, issues emptying his bladder, leakage, stream issues, and fatigue throughout treatment. He reported minimal dysuria, urgency, and diarrhea, with one instance of constipation as well as nocturia x3-4/night.  He was maintained on ADT throughout his course of radiation which he tolerated well.                           On review of systems, the patient states that he is doing very well.  He has had almost complete resolution of LUTS back to his baseline with nocturia x2 per night.  His current IPSS score is 4 indicating mild urinary symptoms only.  He specifically denies dysuria, gross hematuria, weak stream, straining to void, incomplete bladder emptying, excessive daytime frequency, urgency or incontinence.  He reports a healthy appetite and is maintaining his weight.  He denies significant fatigue or hot flashes associated with ADT.  He has continued to remain active, working out daily.  He denies abdominal pain, nausea, vomiting, diarrhea or constipation.  At this point, he is quite pleased with his progress to date.  ALLERGIES:  is allergic to aspirin and sulfa antibiotics.  Meds: Current Outpatient Medications  Medication  Sig Dispense Refill  . atorvastatin (LIPITOR) 40 MG tablet Take 1 tablet (40 mg total) by mouth daily. 90 tablet 3  . carvedilol (COREG) 3.125 MG tablet Take 1 tablet (3.125 mg total) by mouth 2 (two) times daily. 180 tablet 3  . cholecalciferol (VITAMIN D3) 25 MCG (1000 UT) tablet Take 1,000 Units by mouth daily.    . furosemide (LASIX) 40 MG tablet TAKE 1 TABLET BY MOUTH TWICE A DAY 180 tablet 3  . Multiple Vitamin (MULTIVITAMIN WITH MINERALS) TABS tablet Take 1 tablet by mouth daily. One-A-Day    . potassium chloride (K-DUR) 10 MEQ tablet Take 1 tablet (10 mEq total) by mouth 2 (two) times daily. 180 tablet 3  . sacubitril-valsartan (ENTRESTO) 24-26 MG Take 1 tablet by mouth 2 (two) times daily. 180 tablet 3  . sotalol (BETAPACE) 80 MG tablet TAKE 1 AND 1/2 TABLET BY MOUTH TWICE A DAY 270 tablet 2  . XARELTO 20 MG TABS tablet TAKE 1 TABLET(20 MG) BY MOUTH DAILY WITH SUPPER 90 tablet 2   No current facility-administered medications for this encounter.     Physical Findings:  height is 5\' 6"  (1.676 m) and weight is 213 lb (96.6 kg). His oral temperature is 97.5 F (36.4 C) (abnormal). His blood pressure is 116/57 (abnormal) and his pulse is 59 (abnormal). His respiration is 20 and oxygen saturation is 100%.  Pain Assessment Pain Score: 0-No pain/10 In general this is  a well appearing African-American male in no acute distress.  He's alert and oriented x4 and appropriate throughout the examination. Cardiopulmonary assessment is negative for acute distress and he exhibits normal effort.   Lab Findings: Lab Results  Component Value Date   WBC 6.3 12/18/2017   HGB 13.2 12/18/2017   HCT 40.7 12/18/2017   MCV 93.6 12/18/2017   PLT 200 12/18/2017     Radiographic Findings: Dg Chest 2 View  Result Date: 03/25/2018 CLINICAL DATA:  No chest complaints, Check placement of ICD leads Chronic combined systolic and diastolic CHF (congestive heart failure) ([place]) ICD (implantable  cardioverter-defibrillator) in place EXAM: CHEST - 2 VIEW COMPARISON:  08/05/2017 FINDINGS: Patient has a LEFT ICD. The RIGHT ventricular lead is unchanged. The RIGHT ventricular lead appears shortened compared with prior study 06/07/2016. The coronary sinus LEFT ventricular lead has been withdrawn into the superior vena cava. Device appears unchanged. The heart size is normal. Lungs are clear. No pulmonary edema. IMPRESSION: LEFT ventricular lead pulled back into the superior vena cava. Shortening of the RV lead. No edema. These results were called by telephone at the time of interpretation on 03/25/2018 at 2:24 pm to Dr. Cristopher Peru , who verbally acknowledged these results. Electronically Signed   By: Nolon Nations M.D.   On: 03/25/2018 14:28    Impression/Plan: 1.  68 y.o. gentleman with Stage T2a adenocarcinoma of the prostate with a Gleason's score of 4+4 and a PSA of 6.73.   He will continue to follow up with urology for ongoing PSA determinations and has an appointment scheduled with Dr. Junious Silk on 05/07/2018.  He anticipates completing a full 2-year course of androgen deprivation therapy (started ADT on 05/07/2017). He understands what to expect with regards to PSA monitoring going forward. I will look forward to following his response to treatment via correspondence with urology, and would be happy to continue to participate in his care if clinically indicated. I talked to the patient about what to expect in the future, including his risk for erectile dysfunction and rectal bleeding. I encouraged him to call or return to the office if he has any questions regarding his previous radiation or possible radiation side effects. He was comfortable with this plan and will follow up as needed.    Nicholos Johns, PA-C

## 2018-04-15 DIAGNOSIS — I959 Hypotension, unspecified: Secondary | ICD-10-CM | POA: Diagnosis not present

## 2018-04-15 DIAGNOSIS — J069 Acute upper respiratory infection, unspecified: Secondary | ICD-10-CM | POA: Diagnosis not present

## 2018-04-15 DIAGNOSIS — R112 Nausea with vomiting, unspecified: Secondary | ICD-10-CM | POA: Diagnosis not present

## 2018-04-24 ENCOUNTER — Other Ambulatory Visit: Payer: Self-pay | Admitting: *Deleted

## 2018-04-24 DIAGNOSIS — I4891 Unspecified atrial fibrillation: Secondary | ICD-10-CM

## 2018-04-24 MED ORDER — SACUBITRIL-VALSARTAN 24-26 MG PO TABS: 1.0000 | ORAL_TABLET | Freq: Two times a day (BID) | ORAL | 0 refills | Status: DC

## 2018-04-24 MED ORDER — SOTALOL HCL 80 MG PO TABS: ORAL_TABLET | ORAL | 0 refills | Status: DC

## 2018-04-25 ENCOUNTER — Encounter (INDEPENDENT_AMBULATORY_CARE_PROVIDER_SITE_OTHER): Payer: Self-pay

## 2018-04-25 ENCOUNTER — Other Ambulatory Visit: Payer: Self-pay

## 2018-04-25 ENCOUNTER — Encounter: Payer: Self-pay | Admitting: Internal Medicine

## 2018-04-25 ENCOUNTER — Ambulatory Visit: Payer: Medicare HMO | Admitting: Internal Medicine

## 2018-04-25 VITALS — BP 116/64 | HR 75 | Ht 66.0 in | Wt 215.4 lb

## 2018-04-25 DIAGNOSIS — I4819 Other persistent atrial fibrillation: Secondary | ICD-10-CM | POA: Diagnosis not present

## 2018-04-25 DIAGNOSIS — I5042 Chronic combined systolic (congestive) and diastolic (congestive) heart failure: Secondary | ICD-10-CM

## 2018-04-25 DIAGNOSIS — Z95810 Presence of automatic (implantable) cardiac defibrillator: Secondary | ICD-10-CM

## 2018-04-25 DIAGNOSIS — T82110A Breakdown (mechanical) of cardiac electrode, initial encounter: Secondary | ICD-10-CM

## 2018-04-25 NOTE — Progress Notes (Signed)
HPI Jon Williams returns today for followup of his atrial fib and CHF, s/p ICD insertion. He is a pleasant 68 yo man with the above problems, who underwent biv ICD insertion almost 2 years ago. He has prostate CA. He has not had syncope. No ICD shocks. He denies chest pain or sob. No syncope. No palpitations. When I saw him last he was noted to have no capture on the LV lead and subsequent CXR demonstrated dislodgement of that lead. He does not feel bad. He has CHB and is RV pacing.  Allergies  Allergen Reactions   Aspirin Anaphylaxis and Hives   Sulfa Antibiotics Anaphylaxis and Hives     Current Outpatient Medications  Medication Sig Dispense Refill   atorvastatin (LIPITOR) 40 MG tablet Take 1 tablet (40 mg total) by mouth daily. 90 tablet 3   carvedilol (COREG) 3.125 MG tablet Take 1 tablet (3.125 mg total) by mouth 2 (two) times daily. 180 tablet 3   cholecalciferol (VITAMIN D3) 25 MCG (1000 UT) tablet Take 1,000 Units by mouth daily.     furosemide (LASIX) 40 MG tablet TAKE 1 TABLET BY MOUTH TWICE A DAY 180 tablet 3   Multiple Vitamin (MULTIVITAMIN WITH MINERALS) TABS tablet Take 1 tablet by mouth daily. One-A-Day     potassium chloride (K-DUR) 10 MEQ tablet Take 1 tablet (10 mEq total) by mouth 2 (two) times daily. 180 tablet 3   sacubitril-valsartan (ENTRESTO) 24-26 MG Take 1 tablet by mouth 2 (two) times daily. 180 tablet 0   sotalol (BETAPACE) 80 MG tablet TAKE 1 AND 1/2 TABLET BY MOUTH TWICE A DAY 270 tablet 0   XARELTO 20 MG TABS tablet TAKE 1 TABLET(20 MG) BY MOUTH DAILY WITH SUPPER 90 tablet 2   No current facility-administered medications for this visit.      Past Medical History:  Diagnosis Date   AICD (automatic cardioverter/defibrillator) present 05/25/2016   biv icd   Bunion, right    Chronic combined systolic and diastolic CHF (congestive heart failure) ([place])    Echo 1/18: Mild conc LVH, EF 15-20, severe diff HK, inf and inf-septal AK, Gr 3 DD,  mild to mod MR, severe LAE, mod reduced RVSF, mod RAE, mild TR, PASP 50   Coronary artery disease involving native coronary artery without angina pectoris 04/17/2016   LHC 1/18: pLCx 15, mLCx 20, mRCA 40, dRCA 20, LVEDP 23, mean RA 8, PA 42/20, PCWP 17   DJD (degenerative joint disease)    History of atrial fibrillation    History of atrial flutter    History of cardiomegaly 06/07/2016   Noted on CXR   History of colon polyps 06/28/2017   Noted on colonoscopy   LBBB (left bundle branch block)    NICM (nonischemic cardiomyopathy) ([place])    Echo 1/18:  Mild conc LVH, EF 15-20, severe diff HK, inf and inf-septal AK, Gr 3 DD, mild to mod MR, severe LAE, mod reduced RVSF, mod RAE, mild TR, PASP 50   OA (osteoarthritis)    knee   Other secondary pulmonary hypertension ([place]) 04/17/2016   Prostate cancer ([place]) 2019   Sigmoid diverticulosis 06/28/2017   Noted on colonoscopy    ROS:   All systems reviewed and negative except as noted in the HPI.   Past Surgical History:  Procedure Laterality Date   BIV ICD INSERTION CRT-D N/A 05/25/2016   Procedure: BiV ICD Insertion CRT-D;  Surgeon: [name], MD;  Location: [facility];  Service: Cardiovascular;  Laterality: N/A;   CARDIAC CATHETERIZATION N/A 03/02/2016   Procedure: Right/Left Heart Cath and Coronary Angiography;  Surgeon: Nelva Bush, MD;  Location: [facility];  Service: Cardiovascular;  Laterality: N/A;   CARDIOVERSION N/A 07/17/2016   Procedure: Cardioversion;  Surgeon: [name], MD;  Location: [facility];  Service: Cardiovascular;  Laterality: N/A;   COLONOSCOPY WITH PROPOFOL N/A 06/28/2017   Procedure: COLONOSCOPY WITH PROPOFOL;  Surgeon: [name], MD;  [facility];  Service: Endoscopy;  Laterality: N/A;   colonscopy  2009   GOLD SEED IMPLANT N/A 12/24/2017   Procedure: GOLD SEED IMPLANT, TRANSERINEAL;  Surgeon: Festus Aloe, MD;  Location: [facility];  Service:  Urology;  Laterality: N/A;   INSERT / REPLACE / REMOVE PACEMAKER     POLYPECTOMY  06/28/2017   Procedure: POLYPECTOMY;  Surgeon: [name], MD;  [facility];  Service: Endoscopy;;  ascending and descending colon polyp   PROSTATE BIOPSY  02/20/2017   SPACE OAR INSTILLATION N/A 12/24/2017   Procedure: SPACE OAR INSTILLATION;  Surgeon: Festus Aloe, MD;  [facility];  Service: Urology;  Laterality: N/A;   TOTAL KNEE ARTHROPLASTY Right 08/16/2017   Procedure: RIGHT TOTAL KNEE ARTHROPLASTY;  Surgeon: [name], MD;  Location: [facility];  Service: Orthopedics;  Laterality: Right;     Family History  Problem Relation Age of Onset   Hypertension Mother    Heart disease Mother    Diabetes Mother    Diabetes Father    Hypertension Father    Cancer Father    Healthy Sister    Heart attack Brother    Heart disease Brother 49       + tobacco   Healthy Brother      Social History   Socioeconomic History   Marital status: Married    Spouse name: Not on file   Number of children: Not on file   Years of education: Not on file   Highest education level: Not on file  Occupational History   Not on file  Social Needs   Financial resource strain: Not on file   Food insecurity:    Worry: Not on file    Inability: Not on file   Transportation needs:    Medical: Not on file    Non-medical: Not on file  Tobacco Use   Smoking status: Never Smoker   Smokeless tobacco: Never Used  Substance and Sexual Activity   Alcohol use: No   Drug use: No   Sexual activity: Not Currently  Lifestyle   Physical activity:    Days per week: Not on file    Minutes per session: Not on file   Stress: Not on file  Relationships   Social connections:    Talks on phone: Not on file    Gets together: Not on file    Attends religious service: Not on file    Active member of club or organization: Not on file    Attends meetings of clubs or  organizations: Not on file    Relationship status: Not on file   Intimate partner violence:    Fear of current or ex partner: Not on file    Emotionally abused: Not on file    Physically abused: Not on file    Forced sexual activity: Not on file  Other Topics Concern   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 Daughter,  Beckie Busing, lives in [place]. Son, [place], lives in [place].     BP 116/64    Pulse 75    Ht 5\' 6"  (1.676 m)    Wt 215 lb 6.4 oz (97.7 kg)    SpO2 99%    BMI 34.77 kg/m   Physical Exam:  Well appearing NAD HEENT: Unremarkable Neck:  No JVD, no thyromegally Lymphatics:  No adenopathy Back:  No CVA tenderness Lungs:  Clear with no wheezes HEART:  Regular rate rhythm, no murmurs, no rubs, no clicks Abd:  soft, positive bowel sounds, no organomegally, no rebound, no guarding Ext:  2 plus pulses, no edema, no cyanosis, no clubbing Skin:  No rashes no nodules Neuro:  CN II through XII intact, motor grossly intact  EKG - atrial flutter with ventricular pacing with pacing induced LBBB   Assess/Plan: 1. Dislodged LV lead - I have discussed the treatment options with the patient and recommended we proceed with LV lead revision. I will plan to use the LV lead and 014 wire as a guide rale to insert a new lead. I will try and advance the atrial lead to give more slack.  2. Chronic systolic heart failure - his symptoms are currently class 2. We discussed not doing any additional surgery but I think his LV function will only worsen with RV only pacing. 3. Atrial fib/flutter - he is on sotalol and is in atrial flutter today. He will have to come off of his systemic anti-coagulation. 4. ICD - his battery is still good. The LV lead has been turned off. We will plan revision as noted above.  Mikle Bosworth.D.

## 2018-04-25 NOTE — Patient Instructions (Addendum)
Medication Instructions:  Your physician recommends that you continue on your current medications as directed. Please refer to the Current Medication list given to you today.  Labwork: You will get lab work today:  BMP and CBC.  Testing/Procedures: Your physician has recommended that you have your lead revised.   Follow-Up: You will follow up with device clinic 10-14 days after your procedure for a wound check.  You will follow up with Dr. Lovena Le 91 days after your procedure.   LEAD REVISION INSTRUCTIONS:  Please arrive to ADMITTING down the hall from the [place] main entrance A of [facility] at:  7:30 am on May 09, 2018  Use the CHG surgical scrub as directed  Do not eat or drink after midnight prior to procedure  You may take your normal morning medications the morning of your procedure EXCEPT:  Lasix/potassium Do NOT TAKE your XARELTO for 2 days prior to your procedure.  Your last dose will be May 06, 2018 your evening dose  Plan for one night stay  You will need someone to drive you home at discharge   If you need a refill on your cardiac medications before your next appointment, please call your pharmacy.

## 2018-04-26 LAB — CBC WITH DIFFERENTIAL/PLATELET
Basophils Absolute: 0 10*3/uL (ref 0.0–0.2)
Basos: 0 %
EOS (ABSOLUTE): 0.1 10*3/uL (ref 0.0–0.4)
Eos: 2 %
Hematocrit: 37 % — ABNORMAL LOW (ref 37.5–51.0)
Hemoglobin: 12.3 g/dL — ABNORMAL LOW (ref 13.0–17.7)
Immature Grans (Abs): 0 10*3/uL (ref 0.0–0.1)
Immature Granulocytes: 0 %
Lymphocytes Absolute: 0.8 10*3/uL (ref 0.7–3.1)
Lymphs: 16 %
MCH: 31.6 pg (ref 26.6–33.0)
MCHC: 33.2 g/dL (ref 31.5–35.7)
MCV: 95 fL (ref 79–97)
Monocytes Absolute: 0.6 10*3/uL (ref 0.1–0.9)
Monocytes: 12 %
Neutrophils Absolute: 3.4 10*3/uL (ref 1.4–7.0)
Neutrophils: 70 %
Platelets: 274 10*3/uL (ref 150–450)
RBC: 3.89 x10E6/uL — ABNORMAL LOW (ref 4.14–5.80)
RDW: 13 % (ref 11.6–15.4)
WBC: 5 10*3/uL (ref 3.4–10.8)

## 2018-04-26 LAB — BASIC METABOLIC PANEL
BUN/Creatinine Ratio: 15 (ref 10–24)
BUN: 18 mg/dL (ref 8–27)
CO2: 21 mmol/L (ref 20–29)
Calcium: 9.8 mg/dL (ref 8.6–10.2)
Chloride: 99 mmol/L (ref 96–106)
Creatinine, Ser: 1.22 mg/dL (ref 0.76–1.27)
GFR calc Af Amer: 69 mL/min/{1.73_m2} (ref >59)
GFR calc non Af Amer: 60 mL/min/{1.73_m2} (ref >59)
Glucose: 90 mg/dL (ref 65–99)
Potassium: 3.9 mmol/L (ref 3.5–5.2)
Sodium: 140 mmol/L (ref 134–144)

## 2018-04-27 ENCOUNTER — Other Ambulatory Visit: Payer: Self-pay | Admitting: Internal Medicine

## 2018-04-27 DIAGNOSIS — I4891 Unspecified atrial fibrillation: Secondary | ICD-10-CM

## 2018-04-27 DIAGNOSIS — I2510 Atherosclerotic heart disease of native coronary artery without angina pectoris: Secondary | ICD-10-CM

## 2018-04-29 LAB — CUP PACEART INCLINIC DEVICE CHECK
Battery Remaining Longevity: 64 mo
Brady Statistic RA Percent Paced: 5.1 %
Brady Statistic RV Percent Paced: 90 %
Date Time Interrogation Session: 20200313182004
HighPow Impedance: 63 Ohm
Implantable Lead Implant Date: 20180413
Implantable Lead Implant Date: 20180413
Implantable Lead Implant Date: 20180413
Implantable Lead Location: 753858
Implantable Lead Location: 753859
Implantable Lead Location: 753860
Implantable Lead Model: 7122
Implantable Pulse Generator Implant Date: 20180413
Lead Channel Impedance Value: 375 Ohm
Lead Channel Impedance Value: 375 Ohm
Lead Channel Impedance Value: 800 Ohm
Lead Channel Pacing Threshold Amplitude: 0.75 V
Lead Channel Pacing Threshold Amplitude: 0.75 V
Lead Channel Pacing Threshold Pulse Width: 0.5 ms
Lead Channel Pacing Threshold Pulse Width: 0.5 ms
Lead Channel Sensing Intrinsic Amplitude: 0.6 mV
Lead Channel Sensing Intrinsic Amplitude: 5.8 mV
Lead Channel Setting Pacing Amplitude: 2 V
Lead Channel Setting Pacing Amplitude: 2.5 V
Lead Channel Setting Pacing Pulse Width: 0.5 ms
Lead Channel Setting Sensing Sensitivity: 0.5 mV
Pulse Gen Serial Number: 7398151

## 2018-04-30 ENCOUNTER — Telehealth: Payer: Self-pay

## 2018-05-07 DIAGNOSIS — C61 Malignant neoplasm of prostate: Secondary | ICD-10-CM | POA: Diagnosis not present

## 2018-05-14 DIAGNOSIS — Z5111 Encounter for antineoplastic chemotherapy: Secondary | ICD-10-CM | POA: Diagnosis not present

## 2018-05-14 DIAGNOSIS — C61 Malignant neoplasm of prostate: Secondary | ICD-10-CM | POA: Diagnosis not present

## 2018-05-15 ENCOUNTER — Telehealth: Payer: Self-pay | Admitting: Cardiology

## 2018-05-15 ENCOUNTER — Other Ambulatory Visit: Payer: Self-pay

## 2018-05-15 ENCOUNTER — Ambulatory Visit (INDEPENDENT_AMBULATORY_CARE_PROVIDER_SITE_OTHER): Payer: Medicare HMO | Admitting: *Deleted

## 2018-05-15 DIAGNOSIS — I428 Other cardiomyopathies: Secondary | ICD-10-CM

## 2018-05-15 LAB — CUP PACEART REMOTE DEVICE CHECK
Battery Remaining Longevity: 63 mo
Battery Remaining Percentage: 69 %
Battery Voltage: 2.96 V
Brady Statistic AP VP Percent: 4 %
Brady Statistic AP VS Percent: 1 %
Brady Statistic AS VP Percent: 85 %
Brady Statistic AS VS Percent: 11 %
Brady Statistic RA Percent Paced: 4.6 %
Brady Statistic RV Percent Paced: 89 %
Date Time Interrogation Session: 20200402101647
HighPow Impedance: 64 Ohm
HighPow Impedance: 64 Ohm
Implantable Lead Implant Date: 20180413
Implantable Lead Implant Date: 20180413
Implantable Lead Implant Date: 20180413
Implantable Lead Location: 753858
Implantable Lead Location: 753859
Implantable Lead Location: 753860
Implantable Lead Model: 7122
Implantable Pulse Generator Implant Date: 20180413
Lead Channel Impedance Value: 390 Ohm
Lead Channel Impedance Value: 400 Ohm
Lead Channel Pacing Threshold Amplitude: 0.75 V
Lead Channel Pacing Threshold Amplitude: 1 V
Lead Channel Pacing Threshold Pulse Width: 0.5 ms
Lead Channel Pacing Threshold Pulse Width: 0.5 ms
Lead Channel Sensing Intrinsic Amplitude: 0.6 mV
Lead Channel Sensing Intrinsic Amplitude: 8.4 mV
Lead Channel Setting Pacing Amplitude: 2 V
Lead Channel Setting Pacing Amplitude: 2.5 V
Lead Channel Setting Pacing Pulse Width: 0.5 ms
Lead Channel Setting Sensing Sensitivity: 0.5 mV
Pulse Gen Serial Number: 7398151

## 2018-05-15 NOTE — Telephone Encounter (Signed)
I left a message for the patient that we did indeed get his remote transmission. If he has any questions he can call my direct office number.

## 2018-05-15 NOTE — Telephone Encounter (Signed)
New Message:    Pt wants to know if you received his transmission.

## 2018-05-16 ENCOUNTER — Telehealth: Payer: Self-pay | Admitting: Internal Medicine

## 2018-05-16 NOTE — Telephone Encounter (Signed)
° ° °  Patient calling to cancel 4/17 procedure

## 2018-05-16 NOTE — Telephone Encounter (Signed)
Follow Up    Pt is calling to cancel his procedure appt and would like to reschedule for a later date

## 2018-05-20 ENCOUNTER — Ambulatory Visit: Payer: Medicare HMO

## 2018-05-21 ENCOUNTER — Other Ambulatory Visit: Payer: Self-pay | Admitting: Internal Medicine

## 2018-05-21 DIAGNOSIS — I2510 Atherosclerotic heart disease of native coronary artery without angina pectoris: Secondary | ICD-10-CM

## 2018-05-22 NOTE — Telephone Encounter (Signed)
Left message notifying procedure would be cancelled.  Will reschedule after covid.

## 2018-05-22 NOTE — Telephone Encounter (Signed)
error 

## 2018-05-23 ENCOUNTER — Encounter: Payer: Self-pay | Admitting: Cardiology

## 2018-05-23 NOTE — Progress Notes (Signed)
Remote ICD transmission.   

## 2018-05-30 ENCOUNTER — Ambulatory Visit (HOSPITAL_COMMUNITY): Admission: RE | Admit: 2018-05-30 | Payer: Medicare HMO | Source: Home / Self Care | Admitting: Internal Medicine

## 2018-05-30 ENCOUNTER — Encounter (HOSPITAL_COMMUNITY): Admission: RE | Payer: Self-pay | Source: Home / Self Care

## 2018-05-30 SURGERY — LEAD REVISION/REPAIR

## 2018-06-25 ENCOUNTER — Telehealth: Payer: Self-pay | Admitting: Nurse Practitioner

## 2018-06-25 NOTE — Telephone Encounter (Signed)
[facility] alert for recurrent AF following brief period of NSR with previously known persistent AF. + Sotalol and + Xarelto. Will route to Dr Lovena Le to review need for continuing Sotalol. Also LV lead revision previously cancelled 2/2 COVID. Will route to [facility] [place] [facility] to be sure he is on the list for rescheduling.

## 2018-07-20 ENCOUNTER — Other Ambulatory Visit: Payer: Self-pay | Admitting: Internal Medicine

## 2018-07-20 DIAGNOSIS — I4891 Unspecified atrial fibrillation: Secondary | ICD-10-CM

## 2018-08-07 ENCOUNTER — Telehealth: Payer: Self-pay | Admitting: Internal Medicine

## 2018-08-07 DIAGNOSIS — I428 Other cardiomyopathies: Secondary | ICD-10-CM

## 2018-08-07 NOTE — Telephone Encounter (Signed)
Patient is calling about his surgery for his pacer maker that was postponed a few months ago. He wants to check on the status.

## 2018-08-12 ENCOUNTER — Encounter: Payer: Medicare HMO | Admitting: Internal Medicine

## 2018-08-12 ENCOUNTER — Telehealth: Payer: Self-pay | Admitting: Internal Medicine

## 2018-08-12 NOTE — Telephone Encounter (Signed)
  Patient is scheduled for surgery with Dr Lovena Le on 08/22/18. He is able to make that appointment and needs to reschedule.

## 2018-08-12 NOTE — Telephone Encounter (Signed)
Called pt and discussed pre procedure instructions for his upcoming lead revision on July 10 with Dr. Lovena Le. Please see pre procedure/implant letter for detailed instructions.   Pt repeated and verbalized understanding. He had no additional questions.

## 2018-08-12 NOTE — Telephone Encounter (Signed)
Pt is calling to let us know his schedule has changed and he will not be able to do his procedure on July 10. He prefers the first case of the day as this is easier for his wife with her work schedule.   I advised him I would speak with Dr Tanna Furry nurse so she may give him some options. He verbalized understanding and had no additional questions.

## 2018-08-13 NOTE — Telephone Encounter (Signed)
Returned call to Pt.  Pt would like first case on next Friday available.  Will r/s for October 10, 2018 at 7:30 am.  Will modify upcoming appts and resend instruction letter.

## 2018-08-14 ENCOUNTER — Ambulatory Visit (INDEPENDENT_AMBULATORY_CARE_PROVIDER_SITE_OTHER): Payer: Medicare HMO | Admitting: *Deleted

## 2018-08-14 DIAGNOSIS — I428 Other cardiomyopathies: Secondary | ICD-10-CM | POA: Diagnosis not present

## 2018-08-14 LAB — CUP PACEART REMOTE DEVICE CHECK
Battery Remaining Longevity: 59 mo
Battery Remaining Percentage: 66 %
Battery Voltage: 2.96 V
Brady Statistic AP VP Percent: 22 %
Brady Statistic AP VS Percent: 1 %
Brady Statistic AS VP Percent: 70 %
Brady Statistic AS VS Percent: 6.9 %
Brady Statistic RA Percent Paced: 22 %
Brady Statistic RV Percent Paced: 92 %
Date Time Interrogation Session: 20200702060036
HighPow Impedance: 63 Ohm
HighPow Impedance: 63 Ohm
Implantable Lead Implant Date: 20180413
Implantable Lead Implant Date: 20180413
Implantable Lead Implant Date: 20180413
Implantable Lead Location: 753858
Implantable Lead Location: 753859
Implantable Lead Location: 753860
Implantable Lead Model: 7122
Implantable Pulse Generator Implant Date: 20180413
Lead Channel Impedance Value: 400 Ohm
Lead Channel Impedance Value: 400 Ohm
Lead Channel Sensing Intrinsic Amplitude: 0.7 mV
Lead Channel Sensing Intrinsic Amplitude: 6.6 mV
Lead Channel Setting Pacing Amplitude: 2 V
Lead Channel Setting Pacing Amplitude: 2.5 V
Lead Channel Setting Pacing Pulse Width: 0.5 ms
Lead Channel Setting Sensing Sensitivity: 0.5 mV
Pulse Gen Serial Number: 7398151

## 2018-08-18 ENCOUNTER — Other Ambulatory Visit: Payer: Self-pay | Admitting: Internal Medicine

## 2018-08-19 ENCOUNTER — Other Ambulatory Visit: Payer: Medicare HMO

## 2018-08-19 ENCOUNTER — Other Ambulatory Visit (HOSPITAL_COMMUNITY): Payer: Medicare HMO

## 2018-08-21 ENCOUNTER — Encounter: Payer: Self-pay | Admitting: Cardiology

## 2018-08-21 NOTE — Progress Notes (Signed)
Remote ICD transmission.   

## 2018-09-02 ENCOUNTER — Ambulatory Visit: Payer: Medicare HMO

## 2018-10-01 ENCOUNTER — Telehealth: Payer: Self-pay | Admitting: Internal Medicine

## 2018-10-01 NOTE — Telephone Encounter (Signed)
Returned call to Pt.  Clarified that covid test will now by done at [facility].  Pt indicates understanding.  No other changes to pre procedure instructions.

## 2018-10-01 NOTE — Telephone Encounter (Signed)
° ° °  Patient calling to get clarification on pre procedure instructions. Please call

## 2018-10-08 ENCOUNTER — Other Ambulatory Visit: Payer: Medicare HMO | Admitting: *Deleted

## 2018-10-08 ENCOUNTER — Other Ambulatory Visit (HOSPITAL_COMMUNITY)
Admission: RE | Admit: 2018-10-08 | Discharge: 2018-10-08 | Disposition: A | Discharge status: Home / Self Care | Payer: Medicare HMO | Source: Ambulatory Visit | Attending: Internal Medicine | Admitting: Internal Medicine

## 2018-10-08 ENCOUNTER — Other Ambulatory Visit: Payer: Self-pay

## 2018-10-08 DIAGNOSIS — Z01812 Encounter for preprocedural laboratory examination: Secondary | ICD-10-CM | POA: Insufficient documentation

## 2018-10-08 DIAGNOSIS — Z20828 Contact with and (suspected) exposure to other viral communicable diseases: Secondary | ICD-10-CM | POA: Insufficient documentation

## 2018-10-08 DIAGNOSIS — I428 Other cardiomyopathies: Secondary | ICD-10-CM

## 2018-10-08 LAB — CBC
Hematocrit: 35 % — ABNORMAL LOW (ref 37.5–51.0)
Hemoglobin: 11.8 g/dL — ABNORMAL LOW (ref 13.0–17.7)
MCH: 32.3 pg (ref 26.6–33.0)
MCHC: 33.7 g/dL (ref 31.5–35.7)
MCV: 96 fL (ref 79–97)
Platelets: 173 10*3/uL (ref 150–450)
RBC: 3.65 x10E6/uL — ABNORMAL LOW (ref 4.14–5.80)
RDW: 12.6 % (ref 11.6–15.4)
WBC: 4.2 10*3/uL (ref 3.4–10.8)

## 2018-10-08 LAB — BASIC METABOLIC PANEL
BUN/Creatinine Ratio: 16 (ref 10–24)
BUN: 19 mg/dL (ref 8–27)
CO2: 21 mmol/L (ref 20–29)
Calcium: 9.2 mg/dL (ref 8.6–10.2)
Chloride: 103 mmol/L (ref 96–106)
Creatinine, Ser: 1.16 mg/dL (ref 0.76–1.27)
GFR calc Af Amer: 74 mL/min/{1.73_m2} (ref >59)
GFR calc non Af Amer: 64 mL/min/{1.73_m2} (ref >59)
Glucose: 119 mg/dL — ABNORMAL HIGH (ref 65–99)
Potassium: 3.6 mmol/L (ref 3.5–5.2)
Sodium: 141 mmol/L (ref 134–144)

## 2018-10-08 LAB — SARS CORONAVIRUS 2 (TAT 6-24 HRS): SARS Coronavirus 2: NEGATIVE

## 2018-10-10 ENCOUNTER — Ambulatory Visit (HOSPITAL_COMMUNITY)
Admission: RE | Admit: 2018-10-10 | Discharge: 2018-10-11 | Disposition: A | Discharge status: Home / Self Care | Payer: Medicare HMO | Attending: Internal Medicine | Admitting: Internal Medicine

## 2018-10-10 ENCOUNTER — Other Ambulatory Visit: Payer: Self-pay

## 2018-10-10 ENCOUNTER — Ambulatory Visit (HOSPITAL_COMMUNITY)
Admission: RE | Disposition: A | Discharge status: Home / Self Care | Payer: Medicare HMO | Source: Home / Self Care | Attending: Internal Medicine

## 2018-10-10 ENCOUNTER — Encounter (HOSPITAL_COMMUNITY): Payer: Self-pay | Admitting: General Practice

## 2018-10-10 DIAGNOSIS — Z006 Encounter for examination for normal comparison and control in clinical research program: Secondary | ICD-10-CM | POA: Insufficient documentation

## 2018-10-10 DIAGNOSIS — Z886 Allergy status to analgesic agent status: Secondary | ICD-10-CM | POA: Diagnosis not present

## 2018-10-10 DIAGNOSIS — I447 Left bundle-branch block, unspecified: Secondary | ICD-10-CM | POA: Diagnosis not present

## 2018-10-10 DIAGNOSIS — Z8546 Personal history of malignant neoplasm of prostate: Secondary | ICD-10-CM | POA: Insufficient documentation

## 2018-10-10 DIAGNOSIS — Z950 Presence of cardiac pacemaker: Secondary | ICD-10-CM | POA: Insufficient documentation

## 2018-10-10 DIAGNOSIS — I4892 Unspecified atrial flutter: Secondary | ICD-10-CM | POA: Diagnosis not present

## 2018-10-10 DIAGNOSIS — Z7901 Long term (current) use of anticoagulants: Secondary | ICD-10-CM | POA: Diagnosis not present

## 2018-10-10 DIAGNOSIS — I2510 Atherosclerotic heart disease of native coronary artery without angina pectoris: Secondary | ICD-10-CM | POA: Diagnosis not present

## 2018-10-10 DIAGNOSIS — I5042 Chronic combined systolic (congestive) and diastolic (congestive) heart failure: Secondary | ICD-10-CM | POA: Insufficient documentation

## 2018-10-10 DIAGNOSIS — I4891 Unspecified atrial fibrillation: Secondary | ICD-10-CM | POA: Diagnosis not present

## 2018-10-10 DIAGNOSIS — K5730 Diverticulosis of large intestine without perforation or abscess without bleeding: Secondary | ICD-10-CM | POA: Diagnosis not present

## 2018-10-10 DIAGNOSIS — Y839 Surgical procedure, unspecified as the cause of abnormal reaction of the patient, or of later complication, without mention of misadventure at the time of the procedure: Secondary | ICD-10-CM | POA: Diagnosis not present

## 2018-10-10 DIAGNOSIS — T829XXA Unspecified complication of cardiac and vascular prosthetic device, implant and graft, initial encounter: Secondary | ICD-10-CM | POA: Diagnosis not present

## 2018-10-10 DIAGNOSIS — Z95810 Presence of automatic (implantable) cardiac defibrillator: Secondary | ICD-10-CM

## 2018-10-10 DIAGNOSIS — I428 Other cardiomyopathies: Secondary | ICD-10-CM | POA: Insufficient documentation

## 2018-10-10 DIAGNOSIS — T82110A Breakdown (mechanical) of cardiac electrode, initial encounter: Secondary | ICD-10-CM | POA: Diagnosis not present

## 2018-10-10 DIAGNOSIS — Z882 Allergy status to sulfonamides status: Secondary | ICD-10-CM | POA: Insufficient documentation

## 2018-10-10 DIAGNOSIS — T82110D Breakdown (mechanical) of cardiac electrode, subsequent encounter: Secondary | ICD-10-CM | POA: Diagnosis not present

## 2018-10-10 DIAGNOSIS — Z79899 Other long term (current) drug therapy: Secondary | ICD-10-CM | POA: Insufficient documentation

## 2018-10-10 DIAGNOSIS — T82110S Breakdown (mechanical) of cardiac electrode, sequela: Secondary | ICD-10-CM

## 2018-10-10 HISTORY — PX: LEAD REVISION/REPAIR: EP1213

## 2018-10-10 HISTORY — PX: LEAD REVISION: SHX5945

## 2018-10-10 LAB — SURGICAL PCR SCREEN
MRSA, PCR: NEGATIVE
Staphylococcus aureus: NEGATIVE

## 2018-10-10 SURGERY — LEAD REVISION/REPAIR

## 2018-10-10 MED ORDER — SODIUM CHLORIDE 0.9 % IV SOLN
INTRAVENOUS | Status: DC
  Administered 2018-10-10: 06:00:00 via INTRAVENOUS

## 2018-10-10 MED ORDER — SACUBITRIL-VALSARTAN 24-26 MG PO TABS
1.0000 | ORAL_TABLET | Freq: Two times a day (BID) | ORAL | Status: DC
  Administered 2018-10-10 – 2018-10-11 (×2): 1 via ORAL
  Filled 2018-10-10 (×2): qty 1

## 2018-10-10 MED ORDER — SODIUM CHLORIDE 0.9 % IV SOLN
INTRAVENOUS | Status: AC
  Filled 2018-10-10: qty 2

## 2018-10-10 MED ORDER — HEPARIN (PORCINE) IN NACL 1000-0.9 UT/500ML-% IV SOLN
INTRAVENOUS | Status: AC
  Filled 2018-10-10: qty 500

## 2018-10-10 MED ORDER — POTASSIUM CHLORIDE ER 10 MEQ PO TBCR
10.0000 meq | EXTENDED_RELEASE_TABLET | Freq: Two times a day (BID) | ORAL | Status: DC
  Administered 2018-10-10 – 2018-10-11 (×3): 10 meq via ORAL
  Filled 2018-10-10 (×6): qty 1

## 2018-10-10 MED ORDER — CEFAZOLIN SODIUM-DEXTROSE 1-4 GM/50ML-% IV SOLN
1.0000 g | Freq: Four times a day (QID) | INTRAVENOUS | Status: AC
  Administered 2018-10-10 – 2018-10-11 (×3): 1 g via INTRAVENOUS
  Filled 2018-10-10 (×3): qty 50

## 2018-10-10 MED ORDER — SOTALOL HCL 120 MG PO TABS
120.0000 mg | ORAL_TABLET | Freq: Two times a day (BID) | ORAL | Status: DC
  Administered 2018-10-10 – 2018-10-11 (×2): 120 mg via ORAL
  Filled 2018-10-10 (×2): qty 1

## 2018-10-10 MED ORDER — VITAMIN D 25 MCG (1000 UNIT) PO TABS
1000.0000 [IU] | ORAL_TABLET | Freq: Every day | ORAL | Status: DC
  Administered 2018-10-10: 1000 [IU] via ORAL
  Filled 2018-10-10: qty 1

## 2018-10-10 MED ORDER — MUPIROCIN 2 % EX OINT
1.0000 "application " | TOPICAL_OINTMENT | Freq: Once | CUTANEOUS | Status: AC
  Administered 2018-10-10: 1 via TOPICAL
  Filled 2018-10-10 (×2): qty 22

## 2018-10-10 MED ORDER — FUROSEMIDE 40 MG PO TABS
40.0000 mg | ORAL_TABLET | Freq: Two times a day (BID) | ORAL | Status: DC
  Administered 2018-10-10 – 2018-10-11 (×2): 40 mg via ORAL
  Filled 2018-10-10 (×2): qty 1

## 2018-10-10 MED ORDER — CHLORHEXIDINE GLUCONATE 4 % EX LIQD
60.0000 mL | Freq: Once | CUTANEOUS | Status: DC
  Filled 2018-10-10: qty 60

## 2018-10-10 MED ORDER — CEFAZOLIN SODIUM-DEXTROSE 2-4 GM/100ML-% IV SOLN
INTRAVENOUS | Status: AC
  Filled 2018-10-10: qty 100

## 2018-10-10 MED ORDER — MIDAZOLAM HCL 5 MG/5ML IJ SOLN
INTRAMUSCULAR | Status: AC
  Filled 2018-10-10: qty 5

## 2018-10-10 MED ORDER — FENTANYL CITRATE (PF) 100 MCG/2ML IJ SOLN
INTRAMUSCULAR | Status: AC
  Filled 2018-10-10: qty 2

## 2018-10-10 MED ORDER — SODIUM CHLORIDE 0.9 % IV SOLN
80.0000 mg | INTRAVENOUS | Status: AC
  Administered 2018-10-10: 80 mg
  Filled 2018-10-10: qty 2

## 2018-10-10 MED ORDER — FENTANYL CITRATE (PF) 100 MCG/2ML IJ SOLN
INTRAMUSCULAR | Status: DC | PRN
  Administered 2018-10-10 (×3): 12.5 ug via INTRAVENOUS
  Administered 2018-10-10: 25 ug via INTRAVENOUS
  Administered 2018-10-10 (×2): 12.5 ug via INTRAVENOUS

## 2018-10-10 MED ORDER — MIDAZOLAM HCL 5 MG/5ML IJ SOLN
INTRAMUSCULAR | Status: DC | PRN
  Administered 2018-10-10: 2 mg via INTRAVENOUS
  Administered 2018-10-10 (×2): 1 mg via INTRAVENOUS
  Administered 2018-10-10: 2 mg via INTRAVENOUS
  Administered 2018-10-10 (×5): 1 mg via INTRAVENOUS

## 2018-10-10 MED ORDER — CARVEDILOL 3.125 MG PO TABS
3.1250 mg | ORAL_TABLET | Freq: Two times a day (BID) | ORAL | Status: DC
  Administered 2018-10-10 – 2018-10-11 (×2): 3.125 mg via ORAL
  Filled 2018-10-10 (×2): qty 1

## 2018-10-10 MED ORDER — CEFAZOLIN SODIUM-DEXTROSE 2-4 GM/100ML-% IV SOLN
2.0000 g | INTRAVENOUS | Status: AC
  Administered 2018-10-10: 2 g via INTRAVENOUS
  Filled 2018-10-10: qty 100

## 2018-10-10 MED ORDER — ATORVASTATIN CALCIUM 40 MG PO TABS
40.0000 mg | ORAL_TABLET | Freq: Every day | ORAL | Status: DC
  Administered 2018-10-11: 40 mg via ORAL
  Filled 2018-10-10: qty 1

## 2018-10-10 MED ORDER — LIDOCAINE HCL (PF) 1 % IJ SOLN
INTRAMUSCULAR | Status: DC | PRN
  Administered 2018-10-10: 55 mL

## 2018-10-10 MED ORDER — ADULT MULTIVITAMIN W/MINERALS CH
1.0000 | ORAL_TABLET | Freq: Every day | ORAL | Status: DC
  Administered 2018-10-10: 1 via ORAL
  Filled 2018-10-10: qty 1

## 2018-10-10 MED ORDER — ACETAMINOPHEN 325 MG PO TABS: 325.0000 mg | ORAL_TABLET | ORAL | Status: DC | PRN

## 2018-10-10 MED ORDER — LIDOCAINE HCL 1 % IJ SOLN
INTRAMUSCULAR | Status: AC
  Filled 2018-10-10: qty 60

## 2018-10-10 MED ORDER — IOHEXOL 350 MG/ML SOLN
INTRAVENOUS | Status: DC | PRN
  Administered 2018-10-10: 10 mL via INTRAVENOUS

## 2018-10-10 MED ORDER — HEPARIN (PORCINE) IN NACL 1000-0.9 UT/500ML-% IV SOLN
INTRAVENOUS | Status: DC | PRN
  Administered 2018-10-10: 500 mL

## 2018-10-10 MED ORDER — FENTANYL CITRATE (PF) 100 MCG/2ML IJ SOLN: 25.0000 ug | INTRAMUSCULAR | Status: DC | PRN

## 2018-10-10 MED ORDER — ONDANSETRON HCL 4 MG/2ML IJ SOLN: 4.0000 mg | Freq: Four times a day (QID) | INTRAMUSCULAR | Status: DC | PRN

## 2018-10-10 MED ORDER — RIVAROXABAN 20 MG PO TABS
20.0000 mg | ORAL_TABLET | Freq: Every day | ORAL | Status: DC
  Administered 2018-10-10: 20 mg via ORAL
  Filled 2018-10-10: qty 1

## 2018-10-10 MED ORDER — CARVEDILOL 3.125 MG PO TABS: 3.1250 mg | ORAL_TABLET | Freq: Two times a day (BID) | ORAL | Status: DC

## 2018-10-10 SURGICAL SUPPLY — 17 items
CABLE SURGICAL S-101-97-12 (CABLE) ×2 IMPLANT
CATH CPS DIRECT 135 DS2C020 (CATHETERS) ×1 IMPLANT
CATH HEX JOSEPH 2-5-2 65CM 6F (CATHETERS) ×1 IMPLANT
CPS IMPLANT KIT 410190 (MISCELLANEOUS) ×1 IMPLANT
GUIDEWIRE ANGLED .035X150CM (WIRE) ×1 IMPLANT
GUIDEWIRE SAF TJ AMPL .035X180 (WIRE) ×1 IMPLANT
KIT MICROPUNCTURE NIT STIFF (SHEATH) ×1 IMPLANT
LEAD TENDRIL MRI 52CM LPA1200M (Lead) ×1 IMPLANT
PAD PRO RADIOLUCENT 2001M-C (PAD) ×2 IMPLANT
SHEATH 9.5FR PRELUDE SNAP 13 (SHEATH) ×1 IMPLANT
SHEATH 9FR PRELUDE SNAP 13 (SHEATH) ×1 IMPLANT
SHEATH PINNACLE 6F 10CM (SHEATH) ×1 IMPLANT
SHEATH PINNACLE 8F 10CM (SHEATH) ×1 IMPLANT
SHEATH WORLEY 9FR 62CM (SHEATH) ×1 IMPLANT
TRAY PACEMAKER INSERTION (PACKS) ×2 IMPLANT
WIRE LUGE 182CM (WIRE) ×1 IMPLANT
WIRE MAILMAN 182CM (WIRE) ×2 IMPLANT

## 2018-10-10 NOTE — H&P (Signed)
HPI Mr. Cockerill returns today for followup of his atrial fib and CHF, s/p ICD insertion. He is a pleasant 68yo man with the above problems, who underwent biv ICD insertion almost 2yearsago. He has prostate CA. He has not had syncope. No ICD shocks.He denies chest pain or sob. No syncope. No palpitations. When I saw him last he was noted to have no capture on the LV lead and subsequent CXR demonstrated dislodgement of that lead. He does not feel bad. He has CHB and is RV pacing.      Allergies  Allergen Reactions  . Aspirin Anaphylaxis and Hives  . Sulfa Antibiotics Anaphylaxis and Hives           Current Outpatient Medications  Medication Sig Dispense Refill  . atorvastatin (LIPITOR) 40 MG tablet Take 1 tablet (40 mg total) by mouth daily. 90 tablet 3  . carvedilol (COREG) 3.125 MG tablet Take 1 tablet (3.125 mg total) by mouth 2 (two) times daily. 180 tablet 3  . cholecalciferol (VITAMIN D3) 25 MCG (1000 UT) tablet Take 1,000 Units by mouth daily.    . furosemide (LASIX) 40 MG tablet TAKE 1 TABLET BY MOUTH TWICE A DAY 180 tablet 3  . Multiple Vitamin (MULTIVITAMIN WITH MINERALS) TABS tablet Take 1 tablet by mouth daily. One-A-Day    . potassium chloride (K-DUR) 10 MEQ tablet Take 1 tablet (10 mEq total) by mouth 2 (two) times daily. 180 tablet 3  . sacubitril-valsartan (ENTRESTO) 24-26 MG Take 1 tablet by mouth 2 (two) times daily. 180 tablet 0  . sotalol (BETAPACE) 80 MG tablet TAKE 1 AND 1/2 TABLET BY MOUTH TWICE A DAY 270 tablet 0  . XARELTO 20 MG TABS tablet TAKE 1 TABLET(20 MG) BY MOUTH DAILY WITH SUPPER 90 tablet 2   No current facility-administered medications for this visit.          Past Medical History:  Diagnosis Date  . AICD (automatic cardioverter/defibrillator) present 05/25/2016   biv icd  . Bunion, right   . Chronic combined systolic and diastolic CHF (congestive heart failure) ([place])    Echo 1/18: Mild conc LVH, EF 15-20, severe diff HK, inf  and inf-septal AK, Gr 3 DD, mild to mod MR, severe LAE, mod reduced RVSF, mod RAE, mild TR, PASP 50  . Coronary artery disease involving native coronary artery without angina pectoris 04/17/2016   LHC 1/18: pLCx 30, mLCx 20, mRCA 40, dRCA 20, LVEDP 23, mean RA 8, PA 42/20, PCWP 17  . DJD (degenerative joint disease)   . History of atrial fibrillation   . History of atrial flutter   . History of cardiomegaly 06/07/2016   Noted on CXR  . History of colon polyps 06/28/2017   Noted on colonoscopy  . LBBB (left bundle branch block)   . NICM (nonischemic cardiomyopathy) ([place])    Echo 1/18:  Mild conc LVH, EF 15-20, severe diff HK, inf and inf-septal AK, Gr 3 DD, mild to mod MR, severe LAE, mod reduced RVSF, mod RAE, mild TR, PASP 50  . OA (osteoarthritis)    knee  . Other secondary pulmonary hypertension ([place]) 04/17/2016  . Prostate cancer ([place]) 2019  . Sigmoid diverticulosis 06/28/2017   Noted on colonoscopy    ROS:   All systems reviewed and negative except as noted in the HPI.        Past Surgical History:  Procedure Laterality Date  . BIV ICD INSERTION CRT-D N/A 05/25/2016   Procedure: BiV ICD Insertion CRT-D;  Surgeon: [name], MD;  Location: [facility];  Service: Cardiovascular;  Laterality: N/A;  . CARDIAC CATHETERIZATION N/A 03/02/2016   Procedure: Right/Left Heart Cath and Coronary Angiography;  Surgeon: Nelva Bush, MD;  Location: [facility];  Service: Cardiovascular;  Laterality: N/A;  . CARDIOVERSION N/A 07/17/2016   Procedure: Cardioversion;  Surgeon: [name], MD;  Location: [place];  Service: Cardiovascular;  Laterality: N/A;  . COLONOSCOPY WITH PROPOFOL N/A 06/28/2017   Procedure: COLONOSCOPY WITH PROPOFOL;  Surgeon: [name], MD;  [facility];  Service: Endoscopy;  Laterality: N/A;  . colonscopy  2009  . GOLD SEED IMPLANT N/A 12/24/2017   Procedure: GOLD SEED IMPLANT, TRANSERINEAL;  Surgeon:  Festus Aloe, MD;  [facility];  Service: Urology;  Laterality: N/A;  . INSERT / REPLACE / REMOVE PACEMAKER    . POLYPECTOMY  06/28/2017   Procedure: POLYPECTOMY;  Surgeon: [name], MD;  [facility];  Service: Endoscopy;;  ascending and descending colon polyp  . PROSTATE BIOPSY  02/20/2017  . SPACE OAR INSTILLATION N/A 12/24/2017   Procedure: SPACE OAR INSTILLATION;  Surgeon: Festus Aloe, MD;  [facility];  Service: Urology;  Laterality: N/A;  . TOTAL KNEE ARTHROPLASTY Right 08/16/2017   Procedure: RIGHT TOTAL KNEE ARTHROPLASTY;  Surgeon: [name], MD;  Location: [facility];  Service: Orthopedics;  Laterality: Right;          Family History  Problem Relation Age of Onset  . Hypertension Mother   . Heart disease Mother   . Diabetes Mother   . Diabetes Father   . Hypertension Father   . Cancer Father   . Healthy Sister   . Heart attack Brother   . Heart disease Brother 56       + tobacco  . Healthy Brother      Social History        Socioeconomic History  . Marital status: Married    Spouse name: Not on file  . Number of children: Not on file  . Years of education: Not on file  . Highest education level: Not on file  Occupational History  . Not on file  Social Needs  . Financial resource strain: Not on file  . Food insecurity:    Worry: Not on file    Inability: Not on file  . Transportation needs:    Medical: Not on file    Non-medical: Not on file  Tobacco Use  . Smoking status: Never Smoker  . Smokeless tobacco: Never Used  Substance and Sexual Activity  . Alcohol use: No  . Drug use: No  . Sexual activity: Not Currently  Lifestyle  . Physical activity:    Days per week: Not on file    Minutes per session: Not on file  . Stress: Not on file  Relationships  . Social connections:    Talks on phone: Not on file    Gets together: Not on file    Attends religious service: Not on  file    Active member of club or organization: Not on file    Attends meetings of clubs or organizations: Not on file    Relationship status: Not on file  . Intimate partner violence:    Fear of current or ex partner: Not on file    Emotionally abused: Not on file    Physically abused: Not on file    Forced sexual activity: Not on file  Other Topics  Concern  . 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 Daughter, Beckie Busing, lives in [place]. Son, [place], lives in [place].     BP 116/64   Pulse 75   Ht 5\' 6"  (1.676 m)   Wt 215 lb 6.4 oz (97.7 kg)   SpO2 99%   BMI 34.77 kg/m   Physical Exam:  Well appearing NAD HEENT: Unremarkable Neck:  No JVD, no thyromegally Lymphatics:  No adenopathy Back:  No CVA tenderness Lungs:  Clear with no wheezes HEART:  Regular rate rhythm, no murmurs, no rubs, no clicks Abd:  soft, positive bowel sounds, no organomegally, no rebound, no guarding Ext:  2 plus pulses, no edema, no cyanosis, no clubbing Skin:  No rashes no nodules Neuro:  CN II through XII intact, motor grossly intact  EKG - atrial flutter with ventricular pacing with pacing induced LBBB   Assess/Plan: 1. Dislodged LV lead - I have discussed the treatment options with the patient and recommended we proceed with LV lead revision. I will plan to use the LV lead and 014 wire as a guide rale to insert a new lead. I will try and advance the atrial lead to give more slack.  2. Chronic systolic heart failure - his symptoms are currently class 2. We discussed not doing any additional surgery but I think his LV function will only worsen with RV only pacing. 3. Atrial fib/flutter - he is on sotalol and is in atrial flutter today. He will have to come off of his systemic anti-coagulation. 4. ICD - his battery is still good. The LV lead has been turned off. We will plan revision as noted above.  Ponciano Ort.  EP Attending  Patient seen and examined. Since prior clinic visit, no change. Plan LV lead revision.  Mikle Bosworth.D.

## 2018-10-10 NOTE — Discharge Summary (Addendum)
ELECTROPHYSIOLOGY PROCEDURE DISCHARGE SUMMARY    Patient ID: Jon Williams,  MRN: [id number], DOB/AGE: [birth year] 68 y.o.  Admit date: 10/10/2018 Discharge date: 10/11/2018   Primary Care Physician: [name], MD  Primary Cardiologist: Dr. Cristopher Peru  Primary Discharge Diagnosis:  Active Problems:   Pacemaker lead malfunction   Pacemaker complications   Pacemaker lead failure, sequela   Allergies  Allergen Reactions  . Aspirin Anaphylaxis and Hives  . Sulfa Antibiotics Anaphylaxis and Hives    Procedures This Admission: LEAD REVISION/REPAIR   Brief HPI: [name] was seen in followup of his atrial fib and CHF, s/p ICD insertion. He is a pleasant 68yo man with the above problems, who underwent biv ICD insertion 05/2016. He has prostate CA. He had not had syncope. No ICD shocks.He denied chest pain or sob. No syncope. No palpitations.When seen last he was noted to have no capture on the LV lead and subsequent CXR demonstrated dislodgement of that lead. He did not feel bad. He has CHB and is RV pacing.  Hospital Course: Pt had attempted LV lead replacement.  The atrial lead was sacrificed to allow for access due to venous occlusion.  The old LV lead was removed.   Multiple attempts to place a new LV lead were made but were unsuccessful due to inadequate target vessels.  LV lead replacement was therefore abandoned.  A new atrial lead was placed.  The patient was observed overnight with no immediate complications. CXR 10/11/2018 demonstrated stable lead position and no pneumothorax. Observed overnight.   Pt was examined am of discharge and determined stable for home with follow up as below.  Device interrogation was reviewed and normal.  Physical Exam: Vitals:   10/10/18 2121 10/10/18 2352 10/11/18 0422 10/11/18 0815  BP: 113/68 106/68 101/73 111/90  Pulse: 74 71 78 75  Resp:      Temp:  99.8 F (37.7 C) 98.9 F (37.2 C) 99.3 F (37.4 C)  TempSrc:  Oral Oral  Oral  SpO2:  99% 98% 100%  Weight:   100.9 kg   Height:        GEN- The patient is well appearing, alert and oriented x 3 today.   HEENT: normocephalic, atraumatic; sclera clear, conjunctiva pink; hearing intact; oropharynx clear; neck supple, no JVP Lymph- no cervical lymphadenopathy Lungs- Clear to ausculation bilaterally, normal work of breathing.  No wheezes, rales, rhonchi Heart- Regular rate and rhythm, no murmurs, rubs or gallops, PMI not laterally displaced GI- soft, non-tender, non-distended, bowel sounds present, no hepatosplenomegaly Extremities- no clubbing, cyanosis, or edema; DP/PT/radial pulses 2+ bilaterally MS- no significant deformity or atrophy Skin- warm and dry, no rash or lesion Psych- euthymic mood, full affect Neuro- strength and sensation are intact    Labs:   Lab Results  Component Value Date   WBC 4.2 10/08/2018   HGB 11.8 (L) 10/08/2018   HCT 35.0 (L) 10/08/2018   MCV 96 10/08/2018   PLT 173 10/08/2018    Recent Labs  Lab 10/08/18 0756  NA 141  K 3.6  CL 103  CO2 21  BUN 19  CREATININE 1.16  CALCIUM 9.2  GLUCOSE 119*     Discharge Medications:  Allergies as of 10/11/2018      Reactions   Aspirin Anaphylaxis, Hives   Sulfa Antibiotics Anaphylaxis, Hives      Medication List    TAKE these medications   atorvastatin 40 MG tablet Commonly known as: LIPITOR TAKE 1 TABLET(40 MG) BY  MOUTH DAILY What changed: See the new instructions.   carvedilol 3.125 MG tablet Commonly known as: COREG TAKE 1 TABLET(3.125 MG) BY MOUTH TWICE DAILY What changed: See the new instructions.   cholecalciferol 25 MCG (1000 UT) tablet Commonly known as: VITAMIN D3 Take 1,000 Units by mouth daily with lunch.   CLEAR EYES OP Place 1 drop into both eyes daily as needed (irritation).   Entresto 24-26 MG Generic drug: sacubitril-valsartan TAKE 1 TABLET BY MOUTH TWICE DAILY   furosemide 40 MG tablet Commonly known as: LASIX TAKE 1 TABLET BY MOUTH  TWICE A DAY What changed: when to take this   multivitamin with minerals Tabs tablet Take 1 tablet by mouth daily with lunch. One-A-Day   potassium chloride 10 MEQ tablet Commonly known as: K-DUR TAKE 1 TABLET(10 MEQ) BY MOUTH TWICE DAILY What changed: See the new instructions.   sotalol 80 MG tablet Commonly known as: BETAPACE TAKE 1 AND 1/2 TABLETS BY MOUTH TWICE DAILY What changed:   how much to take  how to take this  when to take this  additional instructions   Xarelto 20 MG Tabs tablet Generic drug: rivaroxaban TAKE 1 TABLET(20 MG) BY MOUTH DAILY WITH SUPPER What changed: See the new instructions.       Disposition: Pt is being discharged home today in good condition.  Follow-up Information    Baldwin Jamaica, PA-C Follow up on 10/21/2018.   Specialty: Cardiology Why: at [place] for wound check Contact information: [address] [place] [address] [phone]        [name], MD Follow up on 01/12/2019.   Specialty: Cardiology Why: at 0930 for 3 month device check Contact information: [address] [address] [phone]           Duration of Discharge Encounter: Greater than 30 minutes including physician time.  SignedThompson Grayer MD, [facility] 10/11/2018 8:47 AM

## 2018-10-11 ENCOUNTER — Ambulatory Visit (HOSPITAL_COMMUNITY): Payer: Medicare HMO

## 2018-10-11 DIAGNOSIS — Z7901 Long term (current) use of anticoagulants: Secondary | ICD-10-CM | POA: Diagnosis not present

## 2018-10-11 DIAGNOSIS — T829XXA Unspecified complication of cardiac and vascular prosthetic device, implant and graft, initial encounter: Secondary | ICD-10-CM | POA: Diagnosis not present

## 2018-10-11 DIAGNOSIS — Z45018 Encounter for adjustment and management of other part of cardiac pacemaker: Secondary | ICD-10-CM | POA: Diagnosis not present

## 2018-10-11 DIAGNOSIS — I428 Other cardiomyopathies: Secondary | ICD-10-CM | POA: Diagnosis not present

## 2018-10-11 DIAGNOSIS — I517 Cardiomegaly: Secondary | ICD-10-CM | POA: Diagnosis not present

## 2018-10-11 DIAGNOSIS — I2510 Atherosclerotic heart disease of native coronary artery without angina pectoris: Secondary | ICD-10-CM | POA: Diagnosis not present

## 2018-10-11 DIAGNOSIS — I4892 Unspecified atrial flutter: Secondary | ICD-10-CM | POA: Diagnosis not present

## 2018-10-11 DIAGNOSIS — I5042 Chronic combined systolic (congestive) and diastolic (congestive) heart failure: Secondary | ICD-10-CM | POA: Diagnosis not present

## 2018-10-11 DIAGNOSIS — I447 Left bundle-branch block, unspecified: Secondary | ICD-10-CM | POA: Diagnosis not present

## 2018-10-11 DIAGNOSIS — T82198D Other mechanical complication of other cardiac electronic device, subsequent encounter: Secondary | ICD-10-CM | POA: Diagnosis not present

## 2018-10-11 DIAGNOSIS — I4891 Unspecified atrial fibrillation: Secondary | ICD-10-CM | POA: Diagnosis not present

## 2018-10-11 DIAGNOSIS — Z006 Encounter for examination for normal comparison and control in clinical research program: Secondary | ICD-10-CM | POA: Diagnosis not present

## 2018-10-11 NOTE — Discharge Instructions (Signed)
Pacemaker Lead Replacement, Care After This sheet gives you information about how to care for yourself after your procedure. Your health care provider may also give you more specific instructions. If you have problems or questions, contact your health care provider. What can I expect after the procedure? After your procedure, it is common to have:  Mild discomfort at the incision site.  A small amount of drainage or bleeding at the incision site. This is usually no more than a spot. Follow these instructions at home: Incision care          Follow instructions from your health care provider about how to take care of your incision. Make sure you: ? Wash your hands with soap and water before you change your bandage (dressing). If soap and water are not available, use hand sanitizer. ? Change your dressing as told by your health care provider. ? Leave stitches (sutures), skin glue, or adhesive strips in place. These skin closures may need to stay in place for 2 weeks or longer. If adhesive strip edges start to loosen and curl up, you may trim the loose edges. Do not remove adhesive strips completely unless your health care provider tells you to do that.  Check your incision area every day for signs of infection. Check for: ? More redness, swelling, or pain. ? More fluid or blood. ? Warmth. ? Pus or a bad smell. [employer] cell phones should be kept 12 inches (30 cm) away from the pacemaker when they are on. When talking on a cell phone, use the ear on the opposite side of your pacemaker.  Do not place a cell phone in a pocket next to the pacemaker.  Household appliances do not interfere with modern-day pacemakers. Medicines  Take your antibiotic medicine as told by your health care provider. Do not stop taking the antibiotic even if you start to feel better.  Take over-the-counter and prescription medicines only as told by your health care  provider. General instructions  Do not raise the arm on the side of your procedure higher than your shoulder for at least 5 days, or as long as directed by your health care provider. Except for this restriction, continue to use your arm as normal to prevent problems.  Do not take baths, swim, or use a hot tub until your health care provider says it is okay to do so. You may shower as directed by your health care provider.  Do not lift anything that is heavier than 10 lb (4.5 kg), or the limit that your health care provider tells you, until he or she says that it is safe.  Return to your normal activities after 2 weeks, or as told by your health care provider. Ask your health care provider what activities are safe for you.  Keep all follow-up visits as told by your health care provider. This is important. Contact a health care provider if:  You have more redness, swelling, or pain around your incision.  You have more fluid or blood coming from your incision.  Your incision feels warm to the touch.  You have pus or a bad smell coming from your incision.  You have a fever.  The arm or hand on the side of the pacemaker becomes swollen.  The symptoms you had before your procedure are not getting better. Get help right away if:  You develop chest pain.  You feel like you will faint.  You feel light-headed.  You  faint. Summary  Check your incision area every day for signs of infection, such as more fluid or blood. A small amount of drainage or bleeding at the incision site is normal.  Do not raise the arm on the side of your procedure higher than your shoulder for at least 5 days, or as long as directed by your health care provider.  Digital cell phones should be kept 12 inches (30 cm) away from the pacemaker when they are on. When talking on a cell phone, use the ear on the opposite side of your pacemaker.  If the symptoms that led to having your lead replaced are not getting  better, contact your health care provider. This information is not intended to replace advice given to you by your health care provider. Make sure you discuss any questions you have with your health care provider.  After Your Lead revision   Do not lift your arm above shoulder height for 1 week after your procedure. After 7 days, you may progress as below.     Friday October 17, 2018  Saturday October 18, 2018 Sunday October 19, 2018 Monday October 20, 2018    Do not lift, push, pull, or carry anything over 10 pounds with the affected arm until 6 weeks (Friday November 21, 2018) after your procedure.    Monitor your pacemaker site for redness, swelling, and drainage. Call the device clinic at [phone] if you experience these symptoms or fever/chills.  If your incision is sealed with Steri-strips or staples. You may shower 7 days after your procedure and wash your incision with soap and water as long as it is healed. If your incision is closed with Dermabond/Surgical glue. You may shower 1 day after your pacemaker implant and wash your incision with soap and water. Avoid lotions, ointments, or perfumes over your incision until it is well-healed.   You may use a hot tub or a pool AFTER your wound check appointment if the incision is completely closed.   You may drive, unless driving has been restricted by your healthcare providers.   Your Pacemaker may be MRI compatible. We will discus this at your office visit/wound check.    Remote monitoring is used to monitor your pacemaker from home. This monitoring is scheduled every 91 days by our office. It allows [place] to keep an eye on the functioning of your device to ensure it is working properly. You will routinely see your Electrophysiologist annually (more often if necessary).     Heart-Healthy Eating Plan Heart-healthy meal planning includes:  Eating less unhealthy fats.  Eating more healthy fats.  Making other changes in your  diet. Talk with your doctor or a diet specialist (dietitian) to create an eating plan that is right for you. What is my plan? Your doctor may recommend an eating plan that includes:  Total fat: ______% or less of total calories a day.  Saturated fat: ______% or less of total calories a day.  Cholesterol: less than _________mg a day. What are tips for following this plan? Cooking Avoid frying your food. Try to bake, boil, grill, or broil it instead. You can also reduce fat by:  Removing the skin from poultry.  Removing all visible fats from meats.  Steaming vegetables in water or broth. Meal planning   At meals, divide your plate into four equal parts: ? Fill one-half of your plate with vegetables and green salads. ? Fill one-fourth of your plate with whole grains. ? Fill one-fourth of  your plate with lean protein foods.  Eat 4-5 servings of vegetables per day. A serving of vegetables is: ? 1 cup of raw or cooked vegetables. ? 2 cups of raw leafy greens.  Eat 4-5 servings of fruit per day. A serving of fruit is: ? 1 medium whole fruit. ?  cup of dried fruit. ?  cup of fresh, frozen, or canned fruit. ?  cup of 100% fruit juice.  Eat more foods that have soluble fiber. These are apples, broccoli, carrots, beans, peas, and barley. Try to get 20-30 g of fiber per day.  Eat 4-5 servings of nuts, legumes, and seeds per week: ? 1 serving of dried beans or legumes equals  cup after being cooked. ? 1 serving of nuts is  cup. ? 1 serving of seeds equals 1 tablespoon. General information  Eat more home-cooked food. Eat less restaurant, buffet, and fast food.  Limit or avoid alcohol.  Limit foods that are high in starch and sugar.  Avoid fried foods.  Lose weight if you are overweight.  Keep track of how much salt (sodium) you eat. This is important if you have high blood pressure. Ask your doctor to tell you more about this.  Try to add vegetarian meals each  week. Fats  Choose healthy fats. These include olive oil and canola oil, flaxseeds, walnuts, almonds, and seeds.  Eat more omega-3 fats. These include salmon, mackerel, sardines, tuna, flaxseed oil, and ground flaxseeds. Try to eat fish at least 2 times each week.  Check food labels. Avoid foods with trans fats or high amounts of saturated fat.  Limit saturated fats. ? These are often found in animal products, such as meats, butter, and cream. ? These are also found in plant foods, such as palm oil, palm kernel oil, and coconut oil.  Avoid foods with partially hydrogenated oils in them. These have trans fats. Examples are stick margarine, some tub margarines, cookies, crackers, and other baked goods. What foods can I eat? Fruits All fresh, canned (in natural juice), or frozen fruits. Vegetables Fresh or frozen vegetables (raw, steamed, roasted, or grilled). Green salads. Grains Most grains. Choose whole wheat and whole grains most of the time. Rice and pasta, including brown rice and pastas made with whole wheat. Meats and other proteins Lean, well-trimmed beef, veal, pork, and lamb. Chicken and [place] without skin. All fish and shellfish. Wild duck, rabbit, pheasant, and venison. Egg whites or low-cholesterol egg substitutes. Dried beans, peas, lentils, and tofu. Seeds and most nuts. Dairy Low-fat or nonfat cheeses, including ricotta and mozzarella. Skim or 1% milk that is liquid, powdered, or evaporated. Buttermilk that is made with low-fat milk. Nonfat or low-fat yogurt. Fats and oils Non-hydrogenated (trans-free) margarines. Vegetable oils, including soybean, sesame, sunflower, olive, peanut, safflower, corn, canola, and cottonseed. Salad dressings or mayonnaise made with a vegetable oil. Beverages Mineral water. Coffee and tea. Diet carbonated beverages. Sweets and desserts Sherbet, gelatin, and fruit ice. Small amounts of dark chocolate. Limit all sweets and desserts. Seasonings  and condiments All seasonings and condiments. The items listed above may not be a complete list of foods and drinks you can eat. Contact a dietitian for more options. What foods should I avoid? Fruits Canned fruit in heavy syrup. Fruit in cream or butter sauce. Fried fruit. Limit coconut. Vegetables Vegetables cooked in cheese, cream, or butter sauce. Fried vegetables. Grains Breads that are made with saturated or trans fats, oils, or whole milk. Croissants. Sweet rolls. Donuts. High-fat crackers,  such as cheese crackers. Meats and other proteins Fatty meats, such as hot dogs, ribs, sausage, bacon, rib-eye roast or steak. High-fat deli meats, such as salami and bologna. Caviar. Domestic duck and goose. Organ meats, such as liver. Dairy Cream, sour cream, cream cheese, and creamed cottage cheese. Whole-milk cheeses. Whole or 2% milk that is liquid, evaporated, or condensed. Whole buttermilk. Cream sauce or high-fat cheese sauce. Yogurt that is made from whole milk. Fats and oils Meat fat, or shortening. Cocoa butter, hydrogenated oils, palm oil, coconut oil, palm kernel oil. Solid fats and shortenings, including bacon fat, salt pork, lard, and butter. Nondairy cream substitutes. Salad dressings with cheese or sour cream. Beverages Regular sodas and juice drinks with added sugar. Sweets and desserts Frosting. Pudding. Cookies. Cakes. Pies. Milk chocolate or white chocolate. Buttered syrups. Full-fat ice cream or ice cream drinks. The items listed above may not be a complete list of foods and drinks to avoid. Contact a dietitian for more information. Summary  Heart-healthy meal planning includes eating less unhealthy fats, eating more healthy fats, and making other changes in your diet.  Eat a balanced diet. This includes fruits and vegetables, low-fat or nonfat dairy, lean protein, nuts and legumes, whole grains, and heart-healthy oils and fats. This information is not intended to replace  advice given to you by your health care provider. Make sure you discuss any questions you have with your health care provider. Document Released: 07/31/2011 Document Revised: 04/04/2017 Document Reviewed: 03/08/2017 Elsevier Patient Education  2020 [employer].

## 2018-10-11 NOTE — Progress Notes (Signed)
Pt stable, dc home via wheelchair 

## 2018-10-13 ENCOUNTER — Encounter (HOSPITAL_COMMUNITY): Payer: Self-pay | Admitting: Internal Medicine

## 2018-10-13 DIAGNOSIS — I2510 Atherosclerotic heart disease of native coronary artery without angina pectoris: Secondary | ICD-10-CM | POA: Diagnosis not present

## 2018-10-13 DIAGNOSIS — I4819 Other persistent atrial fibrillation: Secondary | ICD-10-CM | POA: Diagnosis not present

## 2018-10-13 DIAGNOSIS — N400 Enlarged prostate without lower urinary tract symptoms: Secondary | ICD-10-CM | POA: Diagnosis not present

## 2018-10-13 MED FILL — Lidocaine HCl Local Inj 1%: INTRAMUSCULAR | Qty: 60 | Status: AC

## 2018-10-14 DIAGNOSIS — H5213 Myopia, bilateral: Secondary | ICD-10-CM | POA: Diagnosis not present

## 2018-10-14 DIAGNOSIS — H524 Presbyopia: Secondary | ICD-10-CM | POA: Diagnosis not present

## 2018-10-14 DIAGNOSIS — H52223 Regular astigmatism, bilateral: Secondary | ICD-10-CM | POA: Diagnosis not present

## 2018-10-19 NOTE — Progress Notes (Signed)
Cardiology Office Note Date:  10/19/2018  Patient ID:  Jon Williams, Jon Williams 1950/10/31, MRN [id number] PCP:  [name], MD  Cardiologist:  Dr. [name] Electrophysiologist: Dr. Lovena Le    Chief Complaint: device site check    History of Present Illness: Jon Williams is a 68 y.o. male with history of NICM w/ICD, persistent Afib, LBBB  He comes in today to be seen for Dr. Lovena Le. He saw Dr. Lovena Le in office last 04/2018, he discussed his LV known to have moved, with noncapture, planned for LV lead revision.... this delayed 2/2 COVID.  He is now s/p attempt at new LV lead, intially his RA lead was sacrificed to make room for LV lead, unfortunately unable 2/2/ stenoic nature of the lateral veins and proximity to the CS os. A new RA lead was plaved, he remains with a traditional dual chamber device.  He feels well, denies any pain at his site, has noticed some swelling that seems to wax/wane.  No fever, heart, drainage.  He denies any CP, palpitations or SOB.  Reports walking several miles most days for exercise with good exertional capacity.  He denies any symptoms of orthopnea or PND, no dizziness, near syncope or syncope   Device information: SJM CRT-D implanted 05/25/16, Dr. Lovena Le, primary prevention >>> LV lead dislodged with non-capture 10/10/2018, unsuccessful attemt at LV lead revision, new RA lead placed remains with dial chamber device.  AFib Hx Tikosyn unable to afford, stopped after 1st dose Sotalol 05/2016   Past Medical History:  Diagnosis Date  . AICD (automatic cardioverter/defibrillator) present 05/25/2016   biv icd  . Bunion, right   . Chronic combined systolic and diastolic CHF (congestive heart failure) ([place])    Echo 1/18: Mild conc LVH, EF 15-20, severe diff HK, inf and inf-septal AK, Gr 3 DD, mild to mod MR, severe LAE, mod reduced RVSF, mod RAE, mild TR, PASP 50  . Coronary artery disease involving native coronary artery without angina pectoris 04/17/2016    LHC 1/18: pLCx 30, mLCx 20, mRCA 40, dRCA 20, LVEDP 23, mean RA 8, PA 42/20, PCWP 17  . DJD (degenerative joint disease)   . History of atrial fibrillation   . History of atrial flutter   . History of cardiomegaly 06/07/2016   Noted on CXR  . History of colon polyps 06/28/2017   Noted on colonoscopy  . LBBB (left bundle branch block)   . NICM (nonischemic cardiomyopathy) ([place])    Echo 1/18:  Mild conc LVH, EF 15-20, severe diff HK, inf and inf-septal AK, Gr 3 DD, mild to mod MR, severe LAE, mod reduced RVSF, mod RAE, mild TR, PASP 50  . OA (osteoarthritis)    knee  . Other secondary pulmonary hypertension ([place]) 04/17/2016  . Prostate cancer ([place]) 2019  . Sigmoid diverticulosis 06/28/2017   Noted on colonoscopy    Past Surgical History:  Procedure Laterality Date  . BIV ICD INSERTION CRT-D N/A 05/25/2016   Procedure: BiV ICD Insertion CRT-D;  Surgeon: [name], MD;  Location: [facility];  Service: Cardiovascular;  Laterality: N/A;  . CARDIAC CATHETERIZATION N/A 03/02/2016   Procedure: Right/Left Heart Cath and Coronary Angiography;  Surgeon: Nelva Bush, MD;  Location: [facility];  Service: Cardiovascular;  Laterality: N/A;  . CARDIOVERSION N/A 07/17/2016   Procedure: Cardioversion;  Surgeon: [name], MD;  Location: [facility];  Service: Cardiovascular;  Laterality: N/A;  . COLONOSCOPY WITH PROPOFOL N/A 06/28/2017  Procedure: COLONOSCOPY WITH PROPOFOL;  Surgeon: [name], MD;  [facility];  Service: Endoscopy;  Laterality: N/A;  . colonscopy  2009  . GOLD SEED IMPLANT N/A 12/24/2017   Procedure: GOLD SEED IMPLANT, TRANSERINEAL;  Surgeon: Festus Aloe, MD;  [facility];  Service: Urology;  Laterality: N/A;  . INSERT / REPLACE / REMOVE PACEMAKER    . LEAD REVISION  10/10/2018  . LEAD REVISION/REPAIR N/A 10/10/2018   Procedure: LEAD REVISION/REPAIR;  Surgeon: [name], MD;  Location: [facility];  Service:  Cardiovascular;  Laterality: N/A;  . POLYPECTOMY  06/28/2017   Procedure: POLYPECTOMY;  Surgeon: [name], MD;  [facility];  Service: Endoscopy;;  ascending and descending colon polyp  . PROSTATE BIOPSY  02/20/2017  . SPACE OAR INSTILLATION N/A 12/24/2017   Procedure: SPACE OAR INSTILLATION;  Surgeon: Festus Aloe, MD;  [facility];  Service: Urology;  Laterality: N/A;  . TOTAL KNEE ARTHROPLASTY Right 08/16/2017   Procedure: RIGHT TOTAL KNEE ARTHROPLASTY;  Surgeon: [name], MD;  Location: [facility];  Service: Orthopedics;  Laterality: Right;    Current Outpatient Medications  Medication Sig Dispense Refill  . atorvastatin (LIPITOR) 40 MG tablet TAKE 1 TABLET(40 MG) BY MOUTH DAILY (Patient taking differently: Take 40 mg by mouth daily. ) 90 tablet 3  . carvedilol (COREG) 3.125 MG tablet TAKE 1 TABLET(3.125 MG) BY MOUTH TWICE DAILY (Patient taking differently: Take 3.125 mg by mouth 2 (two) times daily. ) 180 tablet 3  . cholecalciferol (VITAMIN D3) 25 MCG (1000 UT) tablet Take 1,000 Units by mouth daily with lunch.     Marland Kitchen ENTRESTO 24-26 MG TAKE 1 TABLET BY MOUTH TWICE DAILY (Patient taking differently: Take 1 tablet by mouth 2 (two) times daily. ) 180 tablet 2  . furosemide (LASIX) 40 MG tablet TAKE 1 TABLET BY MOUTH TWICE A DAY (Patient taking differently: Take 40 mg by mouth 2 (two) times daily. ) 180 tablet 3  . Multiple Vitamin (MULTIVITAMIN WITH MINERALS) TABS tablet Take 1 tablet by mouth daily with lunch. One-A-Day     . Naphazoline HCl (CLEAR EYES OP) Place 1 drop into both eyes daily as needed (irritation).    . potassium chloride (K-DUR) 10 MEQ tablet TAKE 1 TABLET(10 MEQ) BY MOUTH TWICE DAILY (Patient taking differently: Take 10 mEq by mouth 2 (two) times daily. ) 180 tablet 2  . sotalol (BETAPACE) 80 MG tablet TAKE 1 AND 1/2 TABLETS BY MOUTH TWICE DAILY (Patient taking differently: Take 120 mg by mouth 2 (two) times daily. ) 270 tablet 2  . XARELTO 20 MG  TABS tablet TAKE 1 TABLET(20 MG) BY MOUTH DAILY WITH SUPPER (Patient taking differently: Take 20 mg by mouth daily with supper. ) 90 tablet 2   No current facility-administered medications for this visit.     Allergies:   Aspirin and Sulfa antibiotics   Social History:  The patient  reports that he has never smoked. He has never used smokeless tobacco. He reports that he does not drink alcohol or use drugs.   Family History:  The patient's family history includes Cancer in his father; Diabetes in his father and mother; Healthy in his brother and sister; Heart attack in his brother; Heart disease in his mother; Heart disease (age of onset: 94) in his brother; Hypertension in his father and mother.  ROS:  Please see the history of present illness.  All other systems are reviewed and otherwise negative.   PHYSICAL EXAM:  VS:  There were no vitals taken for this visit. BMI: There is no height or weight on file to calculate BMI. Well nourished, well developed, in no acute distress  HEENT: normocephalic, atraumatic  Neck: no JVD, carotid bruits or masses Cardiac:  RRR (paced); no significant murmurs, no rubs, or gallops Lungs:   CTA b/l, no wheezing, rhonchi or rales  Abd: soft, nontender MS: no deformity or atrophy Ext: trace if any edema  Skin: warm and dry, no rash Neuro:  No gross deficits appreciated Psych: euthymic mood, full affect  ICD site, steri strips were removed without difficulty.  Wound edges are well approximated and healed.  He has fluctuation of the pocket with some swell, no defined hematoma, non-tender.  No erythema or heat to the surrounding tissues.   EKG:  Done today and reviewed by myself: AV pace, manually measured QT 48ms, Qtc 455ms, given [aced QRS duration 138ms, QTc is good. ICD interrogation done today by industry and reviewed by myself:  Battery and lead measurements are OK RV lead is chronic A lead is new, acute outputs remain. He has SB underlying today    10/10/2018: LV lead revision (attempt) Conclusion: Successful removal of a dislodged left ventricular pacing lead.  unsuccessful insertion of a new left ventricular pacing lead secondary to the stenotic nature of the lateral veins and the close proximity of the posterior vein to the coronary sinus ostium.  To obtain access, the old right atrial lead was also extracted and a new right atrial lead inserted  05/02/16: TTE Study Conclusions - Procedure narrative: Limited study. Transthoracic   echocardiography for left ventricular function evaluation. Image   quality was adequate. - Left ventricle: The cavity size was moderately dilated. Wall   thickness was increased in a pattern of mild LVH. Systolic   function was severely reduced. The estimated ejection fraction   was 15%. LV apical false tendon - no mural thrombus noted.   Diffuse hypokinesis Impressions: - Compared to a prior study in 02/2016, the LVEF remains at 15-20%.  03/02/16: LHC Conclusions: 1. Mild to moderate, non-obstructive coronary artery disease consistent with non-ischemic cardiomyopathy. 2. Upper normal to mildly elevated left and right heart filling pressures. 3. Mild pulmonary hypertension. 4. Reduced Fick cardiac output/index.  Recent Labs: 10/08/2018: BUN 19; Creatinine, Ser 1.16; Hemoglobin 11.8; Platelets 173; Potassium 3.6; Sodium 141  No results found for requested labs within last 8760 hours.   Estimated Creatinine Clearance: 65.9 mL/min (by C-G formula based on SCr of 1.16 mg/dL).   Wt Readings from Last 3 Encounters:  10/11/18 222 lb 7.1 oz (100.9 kg)  04/25/18 215 lb 6.4 oz (97.7 kg)  04/10/18 213 lb (96.6 kg)     Other studies reviewed: Additional studies/records reviewed today include: summarized above  ASSESSMENT AND PLAN:  1. NICM w/ICD     Weight and exam are stable     corVue is below threshold, though no symptoms or exam findings to suggest fluid OL     On BB/Entresto, lasix/K+  2.  CRT-D     s/p unsuccessful attempt of lead revision     D/w Dr. Lovena Le, class II symptoms, no need to consider epcardial lLV lead at this juncture     Has some pocket swelling, on xarelto.  No erythema/heat to suggest infection  Discussed with Dr. Lovena Le, Hold xarelto until Sunday.  He is asked to monitor the site.  If swelling does not improve or if increases to come in and let us  look at it.  Will have him come back tgo device clinic in 2 weeks, sooner if needed.  AS well if he develops any symptoms of infection/illness. He states understanding   3. AFlutter/fib     HA2DS2Vasc is at least 2,  on Xarelto, appropriately dosed     On sotalol 80mg  day, QT stable     Update mag level, recheck his potassium tody    Disposition: Device clinic in 2 weeks, sooner if no improvement in swelling, otherwise Dr. Lovena Le in 3 mo, remotes as usual    Current medicines are reviewed at length with the patient today.  The patient did not have any concerns regarding medicines.  Haywood Lasso, PA-C 10/19/2018 7:55 AM     [address] [place] [address] [phone] (office)  [phone] (fax)

## 2018-10-21 ENCOUNTER — Other Ambulatory Visit: Payer: Self-pay

## 2018-10-21 ENCOUNTER — Encounter: Payer: Self-pay | Admitting: Physician Assistant

## 2018-10-21 ENCOUNTER — Ambulatory Visit (INDEPENDENT_AMBULATORY_CARE_PROVIDER_SITE_OTHER): Payer: Medicare HMO | Admitting: Physician Assistant

## 2018-10-21 VITALS — BP 134/94 | HR 94 | Ht 66.0 in | Wt 216.0 lb

## 2018-10-21 DIAGNOSIS — I5042 Chronic combined systolic (congestive) and diastolic (congestive) heart failure: Secondary | ICD-10-CM

## 2018-10-21 DIAGNOSIS — I4891 Unspecified atrial fibrillation: Secondary | ICD-10-CM

## 2018-10-21 DIAGNOSIS — Z95810 Presence of automatic (implantable) cardiac defibrillator: Secondary | ICD-10-CM | POA: Diagnosis not present

## 2018-10-21 DIAGNOSIS — Z79899 Other long term (current) drug therapy: Secondary | ICD-10-CM | POA: Diagnosis not present

## 2018-10-21 LAB — POTASSIUM: Potassium: 3.9 mmol/L (ref 3.5–5.2)

## 2018-10-21 LAB — MAGNESIUM: Magnesium: 2 mg/dL (ref 1.6–2.3)

## 2018-10-21 NOTE — Patient Instructions (Addendum)
Medication Instructions:   HOLD XARELTO 20 MG UNTIL SUNDAY   If you need a refill on your cardiac medications before your next appointment, please call your pharmacy.   Lab work: MAG  AND POTASSIUM TODAY    If you have labs (blood work) drawn today and your tests are completely normal, you will receive your results only by: Marland Kitchen MyChart Message (if you have MyChart) OR . A paper copy in the mail If you have any lab test that is abnormal or we need to change your treatment, we will call you to review the results.  Testing/Procedures: NONE ORDERED  TODAY   Follow-Up:   WITH DEVIC CLINIC IN 2 WEEKS   At [facility], you and your health needs are our priority.  As part of our continuing mission to provide you with exceptional heart care, we have created designated Provider Care Teams.  These Care Teams include your primary Cardiologist (physician) and Advanced Practice Providers (APPs -  Physician Assistants and Nurse Practitioners) who all work together to provide you with the care you need, when you need it.  You may see Dr. Lovena Le  2 months  as scheduled  Any Other Special Instructions Will Be Listed Below (If Applicable).

## 2018-10-21 NOTE — Addendum Note (Signed)
Addended by: Claude Manges on: 10/21/2018 10:29 AM   Modules accepted: Orders

## 2018-10-23 DIAGNOSIS — I2510 Atherosclerotic heart disease of native coronary artery without angina pectoris: Secondary | ICD-10-CM | POA: Diagnosis not present

## 2018-10-23 DIAGNOSIS — N400 Enlarged prostate without lower urinary tract symptoms: Secondary | ICD-10-CM | POA: Diagnosis not present

## 2018-10-23 DIAGNOSIS — I4819 Other persistent atrial fibrillation: Secondary | ICD-10-CM | POA: Diagnosis not present

## 2018-11-06 ENCOUNTER — Other Ambulatory Visit: Payer: Self-pay

## 2018-11-06 ENCOUNTER — Ambulatory Visit (INDEPENDENT_AMBULATORY_CARE_PROVIDER_SITE_OTHER): Payer: Medicare HMO | Admitting: Student

## 2018-11-06 DIAGNOSIS — T82110A Breakdown (mechanical) of cardiac electrode, initial encounter: Secondary | ICD-10-CM

## 2018-11-06 DIAGNOSIS — I5042 Chronic combined systolic (congestive) and diastolic (congestive) heart failure: Secondary | ICD-10-CM

## 2018-11-06 DIAGNOSIS — T82110D Breakdown (mechanical) of cardiac electrode, subsequent encounter: Secondary | ICD-10-CM | POA: Diagnosis not present

## 2018-11-06 DIAGNOSIS — T148XXA Other injury of unspecified body region, initial encounter: Secondary | ICD-10-CM | POA: Diagnosis not present

## 2018-11-06 LAB — CUP PACEART INCLINIC DEVICE CHECK
Battery Remaining Longevity: 38 mo
Brady Statistic RA Percent Paced: 96 %
Brady Statistic RV Percent Paced: 98 %
Date Time Interrogation Session: 20200924100650
HighPow Impedance: 55.125
Implantable Lead Implant Date: 20200828
Implantable Lead Location: 753862
Implantable Pulse Generator Implant Date: 20180413
Lead Channel Impedance Value: 387.5 Ohm
Lead Channel Impedance Value: 475 Ohm
Lead Channel Pacing Threshold Amplitude: 0.75 V
Lead Channel Pacing Threshold Amplitude: 0.75 V
Lead Channel Pacing Threshold Amplitude: 0.75 V
Lead Channel Pacing Threshold Amplitude: 0.75 V
Lead Channel Pacing Threshold Pulse Width: 0.5 ms
Lead Channel Pacing Threshold Pulse Width: 0.5 ms
Lead Channel Pacing Threshold Pulse Width: 0.5 ms
Lead Channel Pacing Threshold Pulse Width: 0.5 ms
Lead Channel Sensing Intrinsic Amplitude: 0.8 mV
Lead Channel Sensing Intrinsic Amplitude: 4.2 mV
Lead Channel Setting Pacing Amplitude: 2.5 V
Lead Channel Setting Pacing Amplitude: 3.5 V
Lead Channel Setting Pacing Pulse Width: 0.5 ms
Lead Channel Setting Sensing Sensitivity: 0.5 mV
Pulse Gen Serial Number: 7398151

## 2018-11-06 NOTE — Patient Instructions (Addendum)
   Hold Xarelto x 1 week. Resume on Friday November 14, 2018

## 2018-11-06 NOTE — Progress Notes (Signed)
ICD check in clinic. Normal device function. Thresholds and sensing consistent with previous device measurements. Impedance trends stable over time. No evidence of any ventricular arrhythmias. No mode switches. Histogram distribution appropriate for patient and level of activity. Device programmed at appropriate safety margins. PMT noted. V-A time of 426 ms. Reprogramming of PVAB limited by Upper Rate Limit, so no changes today with successful termination of PMT by algorithm. Estimated longevity 3 yr, 2 mo. Pt enrolled in remote follow-up. Next remote 01/12/2019. RTC to see Dr. Lovena Le for 3 month visit same day.   Small hematoma noted. Xarelto held for 1 week per Dr. Lovena Le who visually assessed patient.   Shirley Friar, PA-C  11/06/2018 10:05 AM

## 2018-11-12 DIAGNOSIS — C61 Malignant neoplasm of prostate: Secondary | ICD-10-CM | POA: Diagnosis not present

## 2018-11-14 DIAGNOSIS — R351 Nocturia: Secondary | ICD-10-CM | POA: Diagnosis not present

## 2018-11-14 DIAGNOSIS — C61 Malignant neoplasm of prostate: Secondary | ICD-10-CM | POA: Diagnosis not present

## 2018-11-19 ENCOUNTER — Encounter: Payer: Medicare HMO | Admitting: Internal Medicine

## 2018-11-20 ENCOUNTER — Other Ambulatory Visit: Payer: Self-pay

## 2018-11-20 ENCOUNTER — Ambulatory Visit (INDEPENDENT_AMBULATORY_CARE_PROVIDER_SITE_OTHER): Payer: Medicare HMO | Admitting: *Deleted

## 2018-11-20 DIAGNOSIS — I442 Atrioventricular block, complete: Secondary | ICD-10-CM

## 2018-11-20 NOTE — Patient Instructions (Signed)
Call office if you have increased swelling, drainage or increased pain.

## 2018-11-20 NOTE — Progress Notes (Signed)
Wound re-check due to hematoma after implant. Edema at device site in decreased per patient. Area soft . Xarelto resumed 11/14/18. Patient to call office if he has an increase in edema, drainage, pain  or redness.

## 2019-01-07 DIAGNOSIS — N400 Enlarged prostate without lower urinary tract symptoms: Secondary | ICD-10-CM | POA: Diagnosis not present

## 2019-01-07 DIAGNOSIS — I4819 Other persistent atrial fibrillation: Secondary | ICD-10-CM | POA: Diagnosis not present

## 2019-01-07 DIAGNOSIS — I2510 Atherosclerotic heart disease of native coronary artery without angina pectoris: Secondary | ICD-10-CM | POA: Diagnosis not present

## 2019-01-12 ENCOUNTER — Encounter: Payer: Self-pay | Admitting: Internal Medicine

## 2019-01-12 ENCOUNTER — Other Ambulatory Visit: Payer: Self-pay

## 2019-01-12 ENCOUNTER — Ambulatory Visit: Payer: Medicare HMO

## 2019-01-12 ENCOUNTER — Ambulatory Visit: Payer: Medicare HMO | Admitting: Internal Medicine

## 2019-01-12 VITALS — BP 100/74 | HR 70 | Ht 66.0 in | Wt 214.0 lb

## 2019-01-12 DIAGNOSIS — I4819 Other persistent atrial fibrillation: Secondary | ICD-10-CM

## 2019-01-12 DIAGNOSIS — I5022 Chronic systolic (congestive) heart failure: Secondary | ICD-10-CM | POA: Diagnosis not present

## 2019-01-12 DIAGNOSIS — I5042 Chronic combined systolic (congestive) and diastolic (congestive) heart failure: Secondary | ICD-10-CM | POA: Diagnosis not present

## 2019-01-12 DIAGNOSIS — Z95810 Presence of automatic (implantable) cardiac defibrillator: Secondary | ICD-10-CM

## 2019-01-12 LAB — CUP PACEART REMOTE DEVICE CHECK
Battery Remaining Longevity: 38 mo
Battery Remaining Percentage: 60 %
Battery Voltage: 2.93 V
Brady Statistic AP VP Percent: 95 %
Brady Statistic AP VS Percent: 1.5 %
Brady Statistic AS VP Percent: 2 %
Brady Statistic AS VS Percent: 1 %
Brady Statistic RA Percent Paced: 95 %
Brady Statistic RV Percent Paced: 97 %
Date Time Interrogation Session: 20201130024550
HighPow Impedance: 57 Ohm
HighPow Impedance: 57 Ohm
Implantable Lead Implant Date: 20180413
Implantable Lead Implant Date: 20200828
Implantable Lead Location: 753859
Implantable Lead Location: 753860
Implantable Lead Model: 7122
Implantable Pulse Generator Implant Date: 20180413
Lead Channel Impedance Value: 380 Ohm
Lead Channel Impedance Value: 450 Ohm
Lead Channel Pacing Threshold Amplitude: 0.75 V
Lead Channel Pacing Threshold Amplitude: 0.75 V
Lead Channel Pacing Threshold Pulse Width: 0.5 ms
Lead Channel Pacing Threshold Pulse Width: 0.5 ms
Lead Channel Sensing Intrinsic Amplitude: 11.7 mV
Lead Channel Sensing Intrinsic Amplitude: 3.9 mV
Lead Channel Setting Pacing Amplitude: 2.5 V
Lead Channel Setting Pacing Amplitude: 3.5 V
Lead Channel Setting Pacing Pulse Width: 0.5 ms
Lead Channel Setting Sensing Sensitivity: 0.5 mV
Pulse Gen Serial Number: 7398151

## 2019-01-12 NOTE — Progress Notes (Signed)
HPI Jon Williams returns today for followup. He is a pleasant 68 yo man with a h/o chronic systolic heart failure, sinus node dysfunction, and HTN. He has undergone ICD revision. He feels well. His symptoms are class 1. He has not had syncope or ICD therapies. No edema. He remains active waling up to 4 miles a day.  Allergies  Allergen Reactions  . Aspirin Anaphylaxis and Hives  . Sulfa Antibiotics Anaphylaxis and Hives     Current Outpatient Medications  Medication Sig Dispense Refill  . atorvastatin (LIPITOR) 40 MG tablet TAKE 1 TABLET(40 MG) BY MOUTH DAILY (Patient taking differently: Take 40 mg by mouth daily. ) 90 tablet 3  . carvedilol (COREG) 3.125 MG tablet TAKE 1 TABLET(3.125 MG) BY MOUTH TWICE DAILY (Patient taking differently: Take 3.125 mg by mouth 2 (two) times daily. ) 180 tablet 3  . cholecalciferol (VITAMIN D3) 25 MCG (1000 UT) tablet Take 1,000 Units by mouth daily with lunch.     Marland Kitchen ENTRESTO 24-26 MG TAKE 1 TABLET BY MOUTH TWICE DAILY (Patient taking differently: Take 1 tablet by mouth 2 (two) times daily. ) 180 tablet 2  . furosemide (LASIX) 40 MG tablet TAKE 1 TABLET BY MOUTH TWICE A DAY (Patient taking differently: Take 40 mg by mouth 2 (two) times daily. ) 180 tablet 3  . Multiple Vitamin (MULTIVITAMIN WITH MINERALS) TABS tablet Take 1 tablet by mouth daily with lunch. One-A-Day     . Naphazoline HCl (CLEAR EYES OP) Place 1 drop into both eyes daily as needed (irritation).    . potassium chloride (K-DUR) 10 MEQ tablet TAKE 1 TABLET(10 MEQ) BY MOUTH TWICE DAILY (Patient taking differently: Take 10 mEq by mouth 2 (two) times daily. ) 180 tablet 2  . sotalol (BETAPACE) 80 MG tablet TAKE 1 AND 1/2 TABLETS BY MOUTH TWICE DAILY (Patient taking differently: Take 120 mg by mouth 2 (two) times daily. ) 270 tablet 2  . XARELTO 20 MG TABS tablet TAKE 1 TABLET(20 MG) BY MOUTH DAILY WITH SUPPER (Patient taking differently: Take 20 mg by mouth daily with supper. ) 90 tablet 2    No current facility-administered medications for this visit.      Past Medical History:  Diagnosis Date  . AICD (automatic cardioverter/defibrillator) present 05/25/2016   biv icd  . Bunion, right   . Chronic combined systolic and diastolic CHF (congestive heart failure) ([place])    Echo 1/18: Mild conc LVH, EF 15-20, severe diff HK, inf and inf-septal AK, Gr 3 DD, mild to mod MR, severe LAE, mod reduced RVSF, mod RAE, mild TR, PASP 50  . Coronary artery disease involving native coronary artery without angina pectoris 04/17/2016   LHC 1/18: pLCx 30, mLCx 20, mRCA 40, dRCA 20, LVEDP 23, mean RA 8, PA 42/20, PCWP 17  . DJD (degenerative joint disease)   . History of atrial fibrillation   . History of atrial flutter   . History of cardiomegaly 06/07/2016   Noted on CXR  . History of colon polyps 06/28/2017   Noted on colonoscopy  . LBBB (left bundle branch block)   . NICM (nonischemic cardiomyopathy) ([place])    Echo 1/18:  Mild conc LVH, EF 15-20, severe diff HK, inf and inf-septal AK, Gr 3 DD, mild to mod MR, severe LAE, mod reduced RVSF, mod RAE, mild TR, PASP 50  . OA (osteoarthritis)    knee  . Other secondary pulmonary hypertension ([place]) 04/17/2016  . Prostate cancer ([place]) 2019  .  Sigmoid diverticulosis 06/28/2017   Noted on colonoscopy    ROS:   All systems reviewed and negative except as noted in the HPI.   Past Surgical History:  Procedure Laterality Date  . BIV ICD INSERTION CRT-D N/A 05/25/2016   Procedure: BiV ICD Insertion CRT-D;  Surgeon: [name], MD;  Location: [facility];  Service: Cardiovascular;  Laterality: N/A;  . CARDIAC CATHETERIZATION N/A 03/02/2016   Procedure: Right/Left Heart Cath and Coronary Angiography;  Surgeon: Nelva Bush, MD;  Location: [facility];  Service: Cardiovascular;  Laterality: N/A;  . CARDIOVERSION N/A 07/17/2016   Procedure: Cardioversion;  Surgeon: [name], MD;  Location: [facility];  Service:  Cardiovascular;  Laterality: N/A;  . COLONOSCOPY WITH PROPOFOL N/A 06/28/2017   Procedure: COLONOSCOPY WITH PROPOFOL;  Surgeon: [name], MD;  [facility];  Service: Endoscopy;  Laterality: N/A;  . colonscopy  2009  . GOLD SEED IMPLANT N/A 12/24/2017   Procedure: GOLD SEED IMPLANT, TRANSERINEAL;  Surgeon: Festus Aloe, MD;  [facility];  Service: Urology;  Laterality: N/A;  . INSERT / REPLACE / REMOVE PACEMAKER    . LEAD REVISION  10/10/2018  . LEAD REVISION/REPAIR N/A 10/10/2018   Procedure: LEAD REVISION/REPAIR;  Surgeon: [name], MD;  Location: [facility];  Service: Cardiovascular;  Laterality: N/A;  . POLYPECTOMY  06/28/2017   Procedure: POLYPECTOMY;  Surgeon: [name], MD;  [facility];  Service: Endoscopy;;  ascending and descending colon polyp  . PROSTATE BIOPSY  02/20/2017  . SPACE OAR INSTILLATION N/A 12/24/2017   Procedure: SPACE OAR INSTILLATION;  Surgeon: Festus Aloe, MD;  [facility];  Service: Urology;  Laterality: N/A;  . TOTAL KNEE ARTHROPLASTY Right 08/16/2017   Procedure: RIGHT TOTAL KNEE ARTHROPLASTY;  Surgeon: [name], MD;  Location: [facility];  Service: Orthopedics;  Laterality: Right;     Family History  Problem Relation Age of Onset  . Hypertension Mother   . Heart disease Mother   . Diabetes Mother   . Diabetes Father   . Hypertension Father   . Cancer Father   . Healthy Sister   . Heart attack Brother   . Heart disease Brother 98       + tobacco  . Healthy Brother      Social History   Socioeconomic History  . Marital status: Married    Spouse name: Not on file  . Number of children: Not on file  . Years of education: Not on file  . Highest education level: Not on file  Occupational History  . Not on file  Social Needs  . Financial resource strain: Not on file  . Food insecurity    Worry: Not on file    Inability: Not on file  . Transportation needs    Medical: Not on file     Non-medical: Not on file  Tobacco Use  . Smoking status: Never Smoker  . Smokeless tobacco: Never Used  Substance and Sexual Activity  . Alcohol use: No  . Drug use: No  . Sexual activity: Not Currently  Lifestyle  . Physical activity    Days per week: Not on file    Minutes per session: Not on file  . Stress: Not on file  Relationships  . Social Herbalist on phone: Not on file    Gets together: Not on file    Attends religious service: Not on file  Active member of club or organization: Not on file    Attends meetings of clubs or organizations: Not on file    Relationship status: Not on file  . Intimate partner violence    Fear of current or ex partner: Not on file    Emotionally abused: Not on file    Physically abused: Not on file    Forced sexual activity: Not on file  Other Topics Concern  . 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 Daughter, Beckie Busing, lives in [place]. Son, [place], lives in [place].     BP 100/74   Pulse 70   Ht 5\' 6"  (1.676 m)   Wt 214 lb (97.1 kg)   SpO2 99%   BMI 34.54 kg/m   Physical Exam:  Well appearing NAD HEENT: Unremarkable Neck:  No JVD, no thyromegally Lymphatics:  No adenopathy Back:  No CVA tenderness Lungs:  Clear with no wheezes HEART:  Regular rate rhythm, no murmurs, no rubs, no clicks Abd:  soft, positive bowel sounds, no organomegally, no rebound, no guarding Ext:  2 plus pulses, no edema, no cyanosis, no clubbing Skin:  No rashes no nodules Neuro:  CN II through XII intact, motor grossly intact  DEVICE  Normal device function.  See PaceArt for details.   Assess/Plan: 1. Chronic systolic heart failure - his symptoms are class 1. He will continue his current meds.  He is exercising regularly and doing very well. 2. HTN - his bp is well controlled on medical therapy. He is encouraged to maintain a low sodium diet. 3. ICD - his [employer]. Jude DDD ICD  is working normally. We turned down his atrial output.  Mikle Bosworth.D.

## 2019-01-12 NOTE — Patient Instructions (Addendum)
Medication Instructions:  Your physician recommends that you continue on your current medications as directed. Please refer to the Current Medication list given to you today.  Labwork: None ordered.  Testing/Procedures: None ordered.  Follow-Up: Your physician wants you to follow-up in: 9 months with Dr. Lovena Le.   You will receive a reminder letter in the mail two months in advance. If you don't receive a letter, please call our office to schedule the follow-up appointment.  Remote monitoring is used to monitor your ICD from home. This monitoring reduces the number of office visits required to check your device to one time per year. It allows [place] to keep an eye on the functioning of your device to ensure it is working properly. You are scheduled for a device check from home on 04/13/2019. You may send your transmission at any time that day. If you have a wireless device, the transmission will be sent automatically. After your physician reviews your transmission, you will receive a postcard with your next transmission date.  Any Other Special Instructions Will Be Listed Below (If Applicable).  If you need a refill on your cardiac medications before your next appointment, please call your pharmacy.

## 2019-02-04 ENCOUNTER — Encounter: Payer: Self-pay | Admitting: Podiatry

## 2019-02-04 ENCOUNTER — Ambulatory Visit (INDEPENDENT_AMBULATORY_CARE_PROVIDER_SITE_OTHER): Payer: Medicare HMO | Admitting: Podiatry

## 2019-02-04 ENCOUNTER — Other Ambulatory Visit: Payer: Self-pay

## 2019-02-04 DIAGNOSIS — D689 Coagulation defect, unspecified: Secondary | ICD-10-CM | POA: Diagnosis not present

## 2019-02-04 DIAGNOSIS — M79675 Pain in left toe(s): Secondary | ICD-10-CM | POA: Diagnosis not present

## 2019-02-04 DIAGNOSIS — B351 Tinea unguium: Secondary | ICD-10-CM

## 2019-02-04 DIAGNOSIS — M79674 Pain in right toe(s): Secondary | ICD-10-CM

## 2019-02-04 NOTE — Progress Notes (Signed)
Complaint:  Visit Type: Patient returns to my office for continued preventative foot care services. Complaint: Patient states" my nails have grown long and thick and become painful to walk and wear shoes" Patient has been diagnosed with DM with no foot complications. The patient presents for preventative foot care services.  Podiatric Exam: Vascular: dorsalis pedis and posterior tibial pulses are palpable bilateral. Capillary return is immediate. Temperature gradient is WNL. Skin turgor WNL  Sensorium: Normal Semmes Weinstein monofilament test. Normal tactile sensation bilaterally. Nail Exam: Pt has thick disfigured discolored nails with subungual debris noted bilateral entire nail hallux through fifth toenails Ulcer Exam: There is no evidence of ulcer or pre-ulcerative changes or infection. Orthopedic Exam: Muscle tone and strength are WNL. No limitations in general ROM. No crepitus or effusions noted. Severe HAV deformity with hammer toe second  B/L. Skin: No Porokeratosis. No infection or ulcers  Diagnosis:  Onychomycosis, , Pain in right toe, pain in left toes  Treatment & Plan Procedures and Treatment: Consent by patient was obtained for treatment procedures.   Debridement of mycotic and hypertrophic toenails, 1 through 5 bilateral and clearing of subungual debris. No ulceration, no infection noted. Recommend spenco 3/4 orthoses spenco. Return Visit-Office Procedure: Patient instructed to return to the office for a follow up visit 3 months for continued evaluation and treatment.    Gardiner Barefoot DPM

## 2019-02-26 ENCOUNTER — Other Ambulatory Visit: Payer: Self-pay | Admitting: Internal Medicine

## 2019-02-26 NOTE — Telephone Encounter (Signed)
Prescription refill request for Xarelto received.   Last office visit: 11/30/2020Lovena Le Weight: 97.1 kg  Age: 69 y.o. Scr: 1.22, 04/25/2018 CrCl: 77 ml/min   Prescription refill sent.

## 2019-03-17 ENCOUNTER — Telehealth: Payer: Self-pay

## 2019-03-17 NOTE — Telephone Encounter (Signed)
LM advising no letter needed.  Gave direct # to call if any concerns.

## 2019-03-18 NOTE — Telephone Encounter (Signed)
Returned call to Pt.  [place]  Advised he would not need a letter for a [facility] administered vaccine.  Pt thanked nurse for call back.

## 2019-04-06 DIAGNOSIS — I2510 Atherosclerotic heart disease of native coronary artery without angina pectoris: Secondary | ICD-10-CM | POA: Diagnosis not present

## 2019-04-06 DIAGNOSIS — I4819 Other persistent atrial fibrillation: Secondary | ICD-10-CM | POA: Diagnosis not present

## 2019-04-06 DIAGNOSIS — N400 Enlarged prostate without lower urinary tract symptoms: Secondary | ICD-10-CM | POA: Diagnosis not present

## 2019-04-13 ENCOUNTER — Ambulatory Visit (INDEPENDENT_AMBULATORY_CARE_PROVIDER_SITE_OTHER): Payer: Medicare HMO | Admitting: *Deleted

## 2019-04-13 DIAGNOSIS — Z95810 Presence of automatic (implantable) cardiac defibrillator: Secondary | ICD-10-CM

## 2019-04-13 LAB — CUP PACEART REMOTE DEVICE CHECK
Battery Remaining Longevity: 46 mo
Battery Remaining Percentage: 57 %
Battery Voltage: 2.95 V
Brady Statistic AP VP Percent: 94 %
Brady Statistic AP VS Percent: 1.9 %
Brady Statistic AS VP Percent: 1.8 %
Brady Statistic AS VS Percent: 1 %
Brady Statistic RA Percent Paced: 83 %
Brady Statistic RV Percent Paced: 96 %
Date Time Interrogation Session: 20210301020016
HighPow Impedance: 60 Ohm
HighPow Impedance: 60 Ohm
Implantable Lead Implant Date: 20180413
Implantable Lead Implant Date: 20200828
Implantable Lead Location: 753859
Implantable Lead Location: 753860
Implantable Lead Model: 7122
Implantable Pulse Generator Implant Date: 20180413
Lead Channel Impedance Value: 400 Ohm
Lead Channel Impedance Value: 450 Ohm
Lead Channel Pacing Threshold Amplitude: 0.75 V
Lead Channel Pacing Threshold Amplitude: 1 V
Lead Channel Pacing Threshold Pulse Width: 0.5 ms
Lead Channel Pacing Threshold Pulse Width: 0.5 ms
Lead Channel Sensing Intrinsic Amplitude: 11.7 mV
Lead Channel Sensing Intrinsic Amplitude: 3.5 mV
Lead Channel Setting Pacing Amplitude: 2 V
Lead Channel Setting Pacing Amplitude: 2.5 V
Lead Channel Setting Pacing Pulse Width: 0.5 ms
Lead Channel Setting Sensing Sensitivity: 0.5 mV
Pulse Gen Serial Number: 7398151

## 2019-04-13 NOTE — Progress Notes (Signed)
ICD Remote  

## 2019-04-14 ENCOUNTER — Telehealth: Payer: Self-pay

## 2019-04-14 NOTE — Telephone Encounter (Signed)
ICD transmission received indicating pt in [place] appears since 2/27.  Pt with known history of AF on [place].    Current medications include   Sotalol 120mg  BID, Carvedilol 3.125mg  BID.    Spoke with pt, he denies any current cardiac symptoms or changes recently.    Forwarding to MD for review, scheduled transmission report already exported.

## 2019-04-14 NOTE — Telephone Encounter (Signed)
Rates are controlled. We will hold off on additional treatment.

## 2019-04-21 ENCOUNTER — Other Ambulatory Visit: Payer: Self-pay | Admitting: Internal Medicine

## 2019-04-21 DIAGNOSIS — I4891 Unspecified atrial fibrillation: Secondary | ICD-10-CM

## 2019-04-25 ENCOUNTER — Other Ambulatory Visit: Payer: Self-pay | Admitting: Internal Medicine

## 2019-05-06 ENCOUNTER — Other Ambulatory Visit: Payer: Self-pay

## 2019-05-06 ENCOUNTER — Ambulatory Visit: Payer: Medicare HMO | Admitting: Podiatry

## 2019-05-06 ENCOUNTER — Encounter: Payer: Self-pay | Admitting: Podiatry

## 2019-05-06 VITALS — Temp 96.1°F

## 2019-05-06 DIAGNOSIS — B351 Tinea unguium: Secondary | ICD-10-CM

## 2019-05-06 DIAGNOSIS — D689 Coagulation defect, unspecified: Secondary | ICD-10-CM | POA: Diagnosis not present

## 2019-05-06 DIAGNOSIS — M79674 Pain in right toe(s): Secondary | ICD-10-CM

## 2019-05-06 DIAGNOSIS — M79675 Pain in left toe(s): Secondary | ICD-10-CM

## 2019-05-06 NOTE — Progress Notes (Signed)
This patient returns to my office for at risk foot care.  This patient requires this care by a professional since this patient will be at risk due to having coagulation defect.  Patient is taking eliquiss.  This patient is unable to cut nails himself since the patient cannot reach his nails.These nails are painful walking and wearing shoes.  This patient presents for at risk foot care today.  General Appearance  Alert, conversant and in no acute stress.  Vascular  Dorsalis pedis and posterior tibial  pulses are palpable  bilaterally.  Capillary return is within normal limits  bilaterally. Temperature is within normal limits  bilaterally.  Neurologic  Senn-Weinstein monofilament wire test within normal limits  bilaterally. Muscle power within normal limits bilaterally.  Nails Thick disfigured discolored nails with subungual debris  from hallux to fifth toes bilaterally. No evidence of bacterial infection or drainage bilaterally.  Orthopedic  No limitations of motion  feet .  No crepitus or effusions noted.  No bony pathology or digital deformities noted.  HAV  B/L.  Overlapping second digit  B/L.  Skin  normotropic skin with no porokeratosis noted bilaterally.  No signs of infections or ulcers noted.     Onychomycosis  Pain in right toes  Pain in left toes  Consent was obtained for treatment procedures.   Mechanical debridement of nails 1-5  bilaterally performed with a nail nipper.  Filed with dremel without incident.    Return office visit  3 months                    Told patient to return for periodic foot care and evaluation due to potential at risk complications.   Gardiner Barefoot DPM

## 2019-05-10 ENCOUNTER — Other Ambulatory Visit: Payer: Self-pay | Admitting: Internal Medicine

## 2019-05-12 DIAGNOSIS — C61 Malignant neoplasm of prostate: Secondary | ICD-10-CM | POA: Diagnosis not present

## 2019-05-19 DIAGNOSIS — Z8546 Personal history of malignant neoplasm of prostate: Secondary | ICD-10-CM | POA: Diagnosis not present

## 2019-07-07 ENCOUNTER — Telehealth: Payer: Self-pay | Admitting: Internal Medicine

## 2019-07-07 NOTE — Telephone Encounter (Signed)
Pt c/o medication issue:  1. Name of Medication:   XARELTO 20 MG TABS tablet  ENTRESTO 24-26 MG  2. How are you currently taking this medication (dosage and times per day)? Xarelto 1 tablet by mouth daily  Entresto 1 tablet by mouth twice daily   3. Are you having a reaction (difficulty breathing--STAT)? No   4. What is your medication issue? Council is calling stating he is needing help affording these medications. He states the last time he had these filled it cost him $285. Karreem states the pharmacy advised him he can get these medication at a discounted price if he called Dr. Lovena Le in regards to it and asked for assitance. Please advise.

## 2019-07-08 ENCOUNTER — Telehealth: Payer: Self-pay | Admitting: Internal Medicine

## 2019-07-08 NOTE — Telephone Encounter (Signed)
Duplicate message.  Phone message already received and request being processed.

## 2019-07-08 NOTE — Telephone Encounter (Signed)
New Message  Pt was returning call and wanted to discuss presciption assistance.

## 2019-07-08 NOTE — Telephone Encounter (Signed)
**Note De-Identified Juliah Scadden Obfuscation** No answer so I left a message on the pts VM asking him to call me back at [phone] to discuss the cost of his Xarelto.  I also left [name] [employer] pt asst phone [phone]) in the VM and asked that he contact them concerning his eligibility to receive asst through their asst program, what documents they require he provide and to ask that they mail an application to him. I explained that once he completes his part of the application, obtains needed documents that he can drop off at the office and will will handle the provider part and fax all to [employer] pt asst program.

## 2019-07-09 NOTE — Telephone Encounter (Signed)
**Note De-Identified Jon Williams Obfuscation** The pt has been given the phone numbers to reach out to [employer] pt asst foundation for asst with his [name] and [name] [name] pt asst program for asst with his Xarelto.  He is aware to ask questions concerning his eligibility, what documentation is required for each pt asst programs and to request that they both mail him an application.   He is also aware to complete his part of the applications, obtain needed documents and to bring all to the office to drop off and that we will take care of the provider part of the applications and fax all in to appropriate asst programs.  He thanked me for calling him back.

## 2019-07-15 ENCOUNTER — Ambulatory Visit (INDEPENDENT_AMBULATORY_CARE_PROVIDER_SITE_OTHER): Payer: Medicare HMO | Admitting: *Deleted

## 2019-07-15 DIAGNOSIS — I428 Other cardiomyopathies: Secondary | ICD-10-CM | POA: Diagnosis not present

## 2019-07-15 DIAGNOSIS — I5042 Chronic combined systolic (congestive) and diastolic (congestive) heart failure: Secondary | ICD-10-CM

## 2019-07-15 LAB — CUP PACEART REMOTE DEVICE CHECK
Battery Remaining Longevity: 43 mo
Battery Remaining Percentage: 53 %
Battery Voltage: 2.95 V
Brady Statistic AP VP Percent: 94 %
Brady Statistic AP VS Percent: 1.9 %
Brady Statistic AS VP Percent: 1.8 %
Brady Statistic AS VS Percent: 1 %
Brady Statistic RA Percent Paced: 55 %
Brady Statistic RV Percent Paced: 95 %
Date Time Interrogation Session: 20210531025155
HighPow Impedance: 64 Ohm
HighPow Impedance: 64 Ohm
Implantable Lead Implant Date: 20180413
Implantable Lead Implant Date: 20200828
Implantable Lead Location: 753859
Implantable Lead Location: 753860
Implantable Lead Model: 7122
Implantable Pulse Generator Implant Date: 20180413
Lead Channel Impedance Value: 390 Ohm
Lead Channel Impedance Value: 410 Ohm
Lead Channel Pacing Threshold Amplitude: 0.75 V
Lead Channel Pacing Threshold Amplitude: 1 V
Lead Channel Pacing Threshold Pulse Width: 0.5 ms
Lead Channel Pacing Threshold Pulse Width: 0.5 ms
Lead Channel Sensing Intrinsic Amplitude: 11.7 mV
Lead Channel Sensing Intrinsic Amplitude: 3.5 mV
Lead Channel Setting Pacing Amplitude: 2 V
Lead Channel Setting Pacing Amplitude: 2.5 V
Lead Channel Setting Pacing Pulse Width: 0.5 ms
Lead Channel Setting Sensing Sensitivity: 0.5 mV
Pulse Gen Serial Number: 7398151

## 2019-07-15 NOTE — Telephone Encounter (Signed)
**Note De-Identified Haneen Bernales Obfuscation** The pt called the office wanting to know if he brings his [employer] pt asst application to the office would I go over it to make make sure he filled it out correctly. I advised the pt that I am working remotely and am not in the office but offered to go over the application with him over the phone.  He stated "I filled out my part and wrote in how much I made last year" and wanted to know if that was enough as far as proof. I asked if he s/w anyone at [employer] concerning what they require and he stated that's why Im calling you. I advised him that in most cases pt asst programs require proof of income and asked if he had proof and he again stated " I filled out my part of the application and wrote it in".  I repeated that I cannot say if he has to provide proof or not and that he should call [employer] pt asst foundation to ask what they require. I advised that when he drops his application off at the office I will take care of the provider page and fax to [employer] as is and if they need more they will let us know and that if that is the case it will take longer to process his application. He hung up.

## 2019-07-15 NOTE — Telephone Encounter (Signed)
I called the pt back to check on him and he is ok. He states that he did drop his application off at the office a little while ago. I advised him that I will take care of the provider page of the application and fax all in to [employer] pt asst foundation and that if they have any questions they will let us know. He thanked me for calling him back.

## 2019-07-16 ENCOUNTER — Telehealth: Payer: Self-pay

## 2019-07-16 NOTE — Telephone Encounter (Signed)
The Pt left his completed [employer] pt asst application for Entresto at the office. I have completed the provider page of the application, scanned and emailed all to Dr Forde Dandy nurse so she can obtain his signature, date it and fax all to [place] at [employer] written on cover letter included.

## 2019-07-17 NOTE — Progress Notes (Signed)
Remote ICD transmission.   

## 2019-07-17 NOTE — Telephone Encounter (Signed)
Documentation faxed as requested.

## 2019-07-21 NOTE — Telephone Encounter (Signed)
**Note De-Identified Jon Williams Obfuscation** Letter received from [place] stating that they did approve the pt for asst with his Entresto. Approval is good for the remainder of this year (2021). Patient ID: [id number]  The letter states that they have advised the pt of this approval as well.

## 2019-07-27 ENCOUNTER — Other Ambulatory Visit: Payer: Self-pay | Admitting: Internal Medicine

## 2019-07-27 DIAGNOSIS — I2510 Atherosclerotic heart disease of native coronary artery without angina pectoris: Secondary | ICD-10-CM

## 2019-07-31 ENCOUNTER — Telehealth: Payer: Self-pay

## 2019-07-31 NOTE — Telephone Encounter (Signed)
**Note De-Identified Tawana Pasch Obfuscation** The pt left his completed [name] [employer] Pt Asst application application for Xarelto at the office. I have completed the provider page of the application, scanned/emailed all to Dr Forde Dandy nurse so she can obtain his signature, date it and to fax all the [employer] pt asst program at fax number written on the cover letter included.

## 2019-08-07 ENCOUNTER — Other Ambulatory Visit: Payer: Self-pay

## 2019-08-07 ENCOUNTER — Encounter: Payer: Self-pay | Admitting: Podiatry

## 2019-08-07 ENCOUNTER — Ambulatory Visit: Payer: Medicare HMO | Admitting: Podiatry

## 2019-08-07 DIAGNOSIS — M79674 Pain in right toe(s): Secondary | ICD-10-CM

## 2019-08-07 DIAGNOSIS — B351 Tinea unguium: Secondary | ICD-10-CM | POA: Diagnosis not present

## 2019-08-07 DIAGNOSIS — L84 Corns and callosities: Secondary | ICD-10-CM | POA: Insufficient documentation

## 2019-08-07 DIAGNOSIS — M79675 Pain in left toe(s): Secondary | ICD-10-CM

## 2019-08-07 DIAGNOSIS — D689 Coagulation defect, unspecified: Secondary | ICD-10-CM | POA: Diagnosis not present

## 2019-08-07 NOTE — Progress Notes (Signed)
This patient returns to my office for at risk foot care.  This patient requires this care by a professional since this patient will be at risk due to having coagulation defect.  Patient is taking eliquiss.  This patient is unable to cut nails himself since the patient cannot reach his nails.These nails are painful walking and wearing shoes.  This patient presents for at risk foot care today.  General Appearance  Alert, conversant and in no acute stress.  Vascular  Dorsalis pedis and posterior tibial  pulses are palpable  bilaterally.  Capillary return is within normal limits  bilaterally. Temperature is within normal limits  bilaterally.  Neurologic  Senn-Weinstein monofilament wire test within normal limits  bilaterally. Muscle power within normal limits bilaterally.  Nails Thick disfigured discolored nails with subungual debris  from hallux to fifth toes bilaterally. No evidence of bacterial infection or drainage bilaterally.  Orthopedic  No limitations of motion  feet .  No crepitus or effusions noted.  No bony pathology or digital deformities noted.  HAV  B/L.  Overlapping second digit  B/L.  Skin  normotropic skin with no porokeratosis noted bilaterally.  No signs of infections or ulcers noted.   Corn1/2 right foot.  Onychomycosis  Pain in right toes  Pain in left toes  Consent was obtained for treatment procedures.   Mechanical debridement of nails 1-5  bilaterally performed with a nail nipper.  Filed with dremel without incident. Debridement of callus with # 15 blade.   Return office visit  3 months                    Told patient to return for periodic foot care and evaluation due to potential at risk complications.   Gardiner Barefoot DPM

## 2019-08-11 NOTE — Telephone Encounter (Signed)
Fax sent confirmation received.

## 2019-08-22 IMAGING — DX DG CHEST 2V
2 series · 2 of 2 positions shown · non-contrast
Comparison: 08/05/2017

CLINICAL DATA: No chest complaints, Check placement of ICD leads
Chronic combined systolic and diastolic CHF (congestive heart
failure) (HCC) ICD (implantable cardioverter-defibrillator) in place

EXAM:
CHEST - 2 VIEW

[dg chest 2 view (1 of 2)]
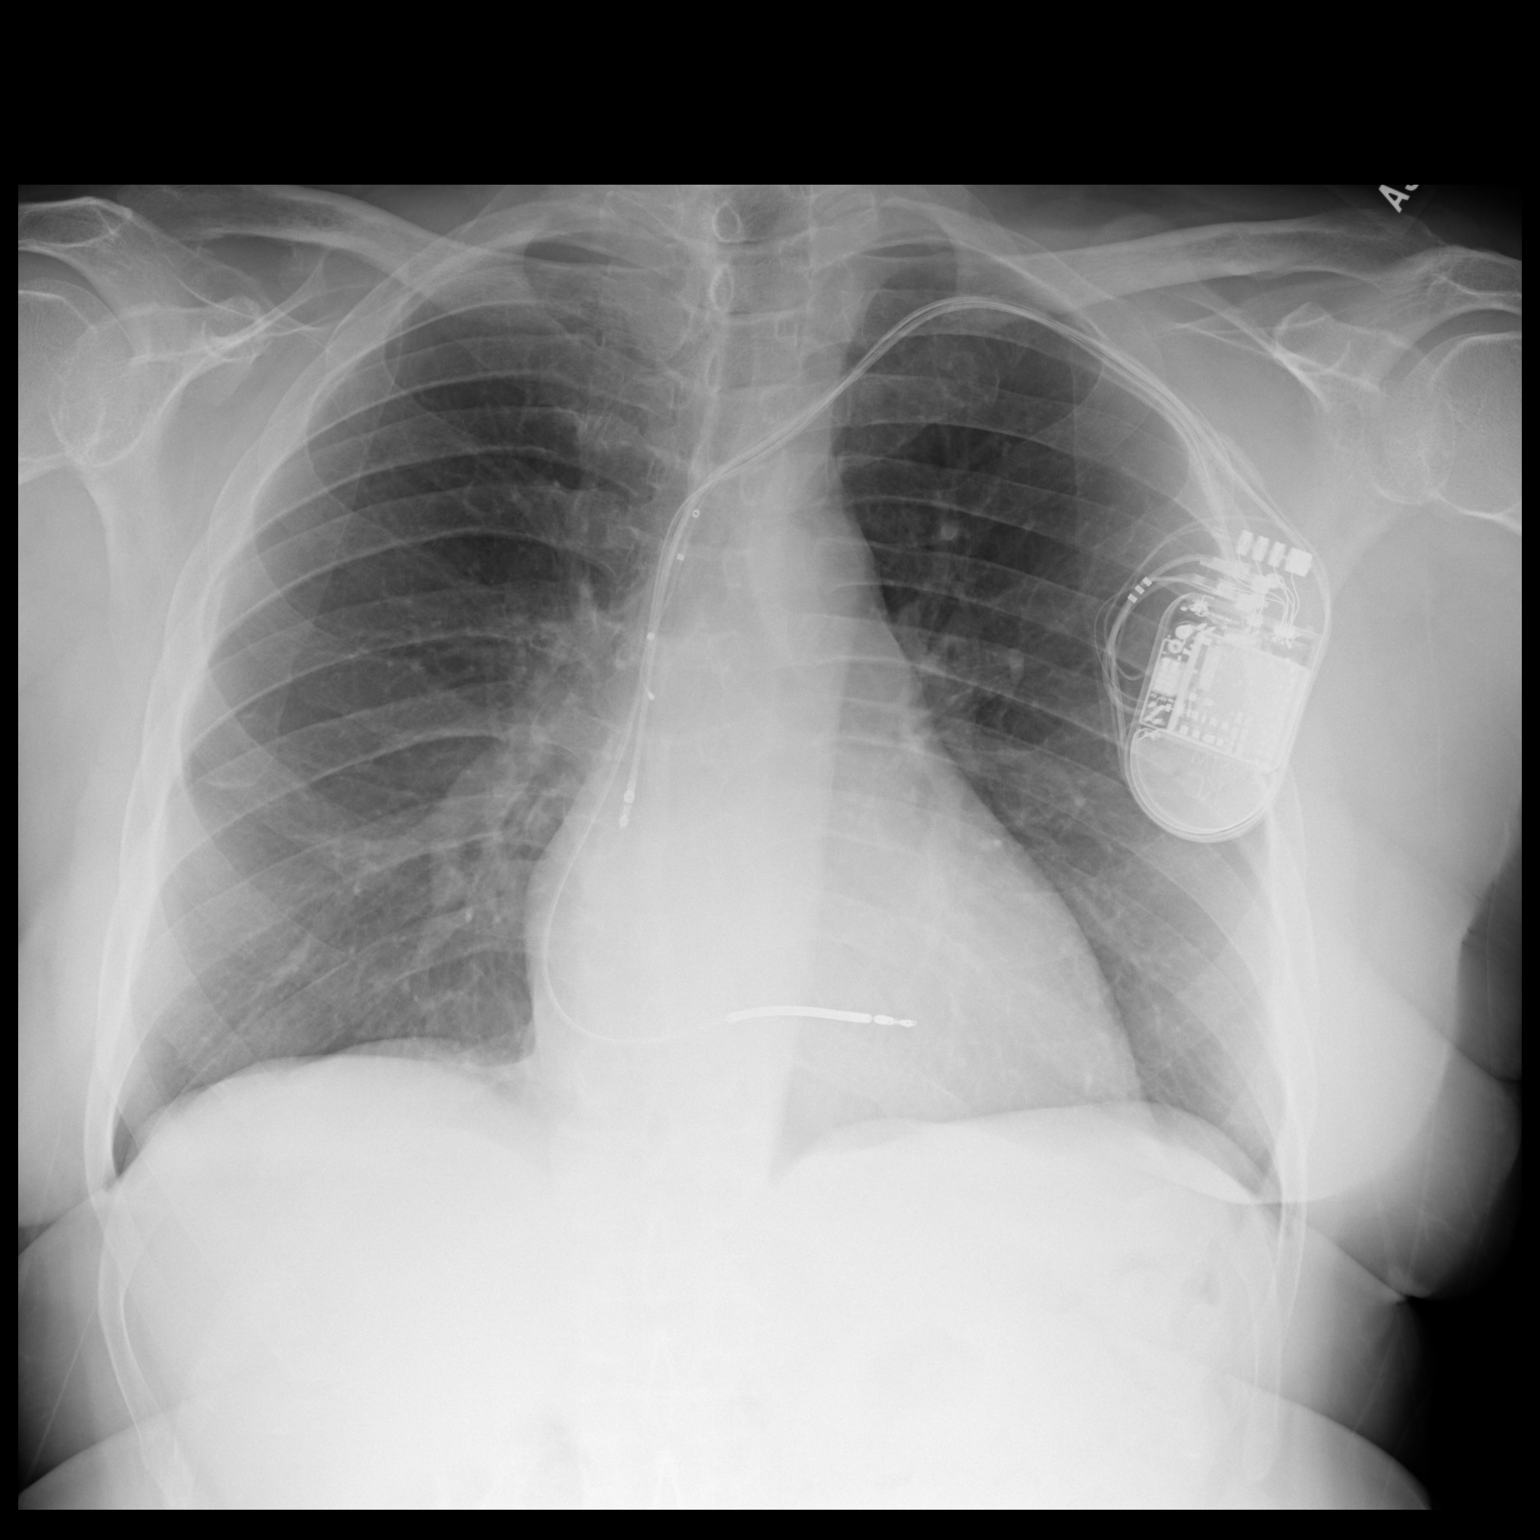

[dg chest 2 view (2 of 2)]
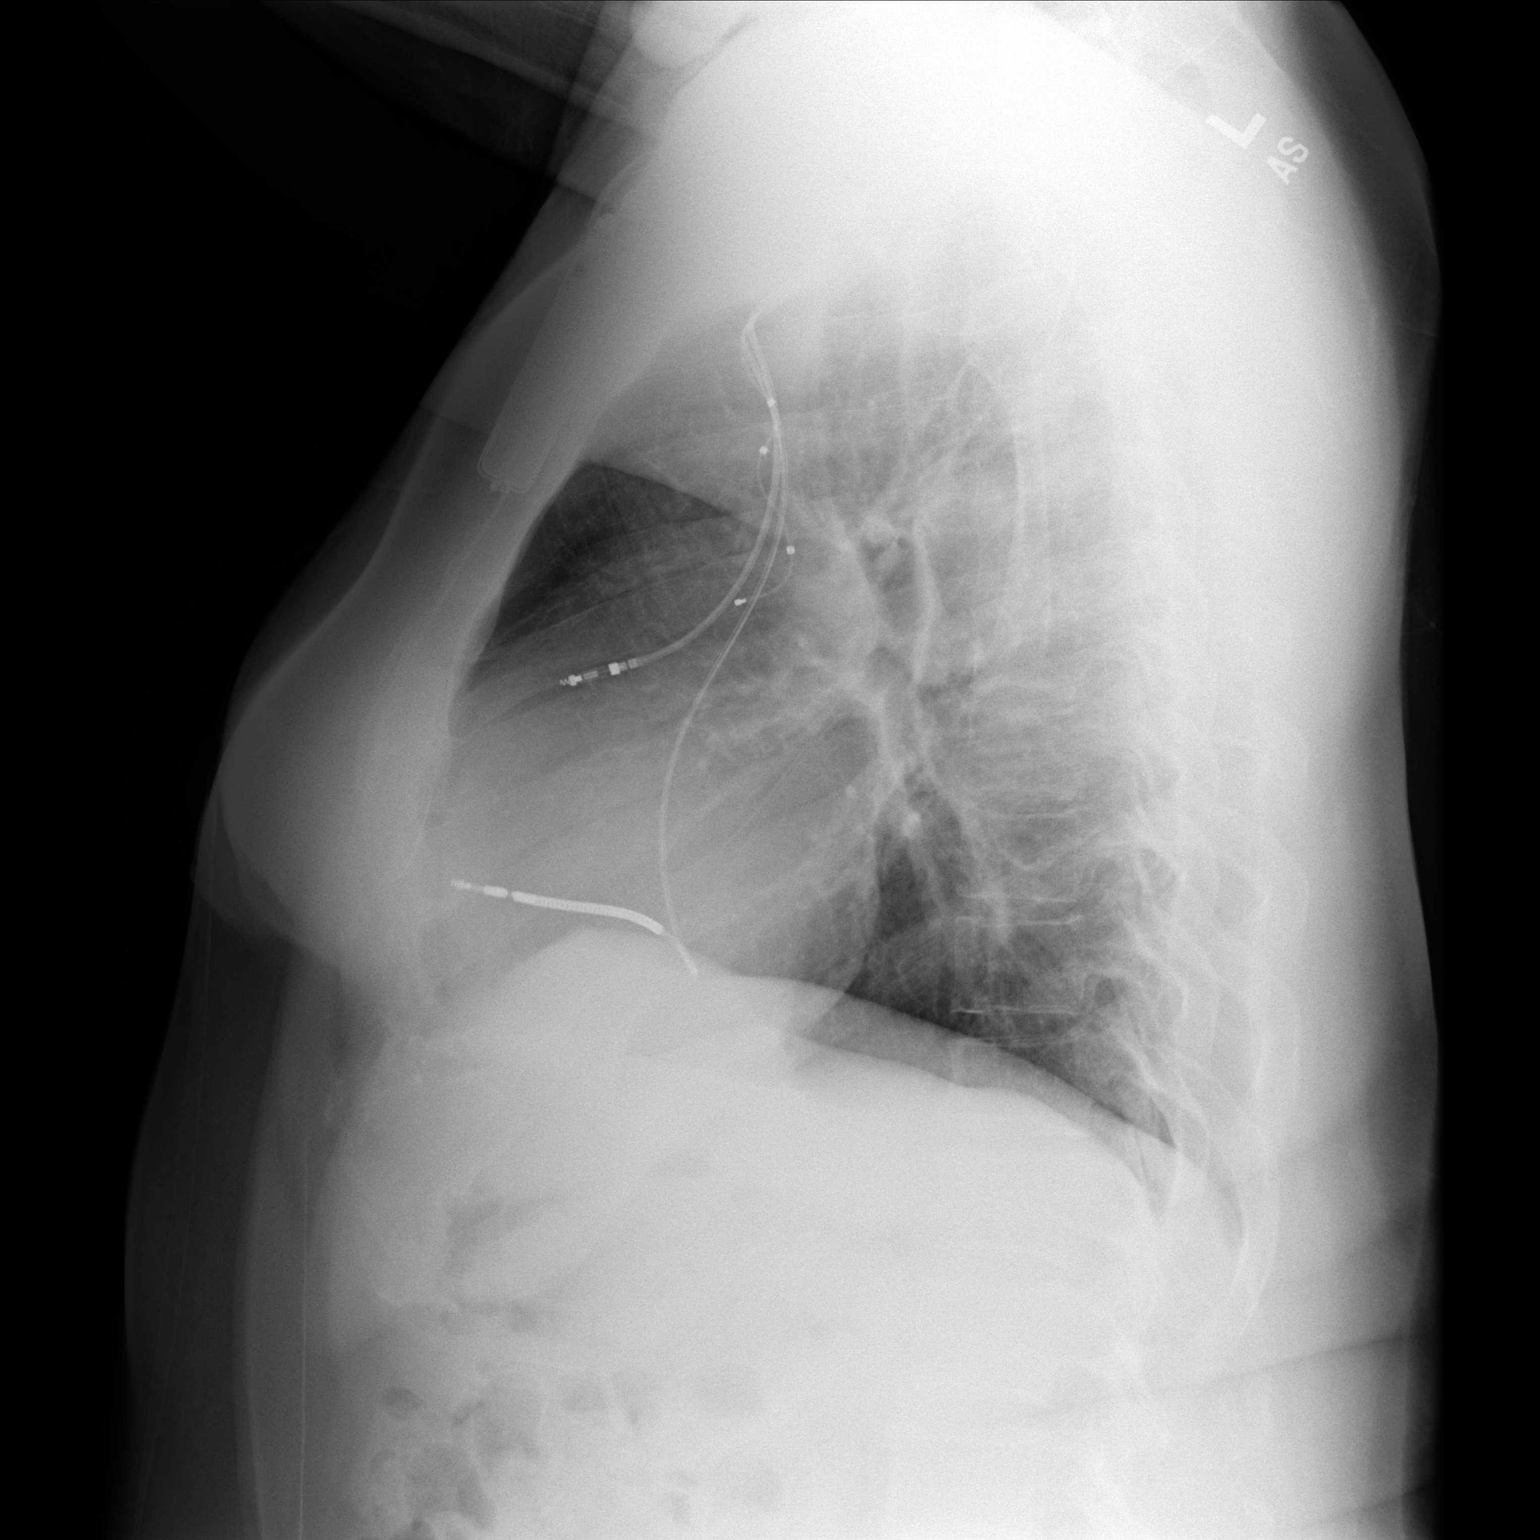

[2 of 2 positions shown; findings below may reference images not displayed]

FINDINGS: Patient has a LEFT ICD. The RIGHT ventricular lead is unchanged. The
RIGHT ventricular lead appears shortened compared with prior study
06/07/2016. The coronary sinus LEFT ventricular lead has been
withdrawn into the superior vena cava. Device appears unchanged.

The heart size is normal. Lungs are clear. No pulmonary edema.
IMPRESSION: LEFT ventricular lead pulled back into the superior vena cava.
Shortening of the RV lead.

No edema.

These results were called by telephone at the time of interpretation
on 03/25/2018 at [DATE] to Dr. JETI-ARI M. DELIU , who verbally
acknowledged these results.

## 2019-08-26 ENCOUNTER — Other Ambulatory Visit: Payer: Self-pay | Admitting: Internal Medicine

## 2019-08-26 NOTE — Telephone Encounter (Signed)
Xarelto 20mg  refill request received. Pt is 69 years old, weight-97.1 kg, Crea-1.16 on 10/08/2018, last seen by Dr. Lovena Le on 01/12/2019, Diagnosis-Afib, CrCl-81.3ml/min; Dose is appropriate based on dosing criteria. Will send in refill to requested pharmacy.

## 2019-09-21 ENCOUNTER — Telehealth: Payer: Self-pay

## 2019-09-21 NOTE — Telephone Encounter (Signed)
[place] Alert AF burden ongoing. Presenting: AF VP 70's.   Spoke with pt, he is asymptomatic, states he is feeling pretty good, has been walking and watching what he eats. Pt confirmed compliance with medications including Sotalol  120mg  BID, Carvedilol 3.125mg  BID and Xarelto.    Pt not currently scheduled for office visit, has recall in for visit due in December.

## 2019-10-12 ENCOUNTER — Ambulatory Visit (INDEPENDENT_AMBULATORY_CARE_PROVIDER_SITE_OTHER): Payer: Medicare HMO | Admitting: *Deleted

## 2019-10-12 DIAGNOSIS — I428 Other cardiomyopathies: Secondary | ICD-10-CM

## 2019-10-12 LAB — CUP PACEART REMOTE DEVICE CHECK
Battery Remaining Longevity: 42 mo
Battery Remaining Percentage: 50 %
Battery Voltage: 2.95 V
Brady Statistic AP VP Percent: 94 %
Brady Statistic AP VS Percent: 1.9 %
Brady Statistic AS VP Percent: 1.8 %
Brady Statistic AS VS Percent: 1 %
Brady Statistic RA Percent Paced: 41 %
Brady Statistic RV Percent Paced: 93 %
Date Time Interrogation Session: 20210830032815
HighPow Impedance: 60 Ohm
HighPow Impedance: 60 Ohm
Implantable Lead Implant Date: 20180413
Implantable Lead Implant Date: 20200828
Implantable Lead Location: 753859
Implantable Lead Location: 753860
Implantable Lead Model: 7122
Implantable Pulse Generator Implant Date: 20180413
Lead Channel Impedance Value: 390 Ohm
Lead Channel Impedance Value: 410 Ohm
Lead Channel Pacing Threshold Amplitude: 0.75 V
Lead Channel Pacing Threshold Amplitude: 1 V
Lead Channel Pacing Threshold Pulse Width: 0.5 ms
Lead Channel Pacing Threshold Pulse Width: 0.5 ms
Lead Channel Sensing Intrinsic Amplitude: 11.7 mV
Lead Channel Sensing Intrinsic Amplitude: 3.5 mV
Lead Channel Setting Pacing Amplitude: 2 V
Lead Channel Setting Pacing Amplitude: 2.5 V
Lead Channel Setting Pacing Pulse Width: 0.5 ms
Lead Channel Setting Sensing Sensitivity: 0.5 mV
Pulse Gen Serial Number: 7398151

## 2019-10-14 NOTE — Progress Notes (Signed)
Remote ICD transmission.   

## 2019-10-26 ENCOUNTER — Telehealth: Payer: Self-pay

## 2019-10-26 NOTE — Telephone Encounter (Signed)
Pt calling requesting help to get his medication Entresto and Xarelto. Lynn,LPN, can you please advise on this matter? Thanks

## 2019-10-27 NOTE — Telephone Encounter (Signed)
**Note De-Identified Jon Williams Obfuscation** The pt was approved for pt asst through [place] as follows:  July 21, 2019 Me   1:05 PM Note Letter received from [place] stating that they did approve the pt for asst with his Entresto. Approval is good for the remainder of this year (2021). Patient ID: [id number]  The letter states that they have advised the pt of this approval as well.     We faxed his [name] [employer] Pt Asst application for Xarelto to [employer] as follows:  August 11, 2019 Damian Leavell, RN  8:41 AM Note Fax sent-confirmation received    July 31, 2019 Me to Damian Leavell, RN    11:39 AM Note The pt left his completed [employer] Ramus Pt Asst application application for Xarelto at the office. I have completed the provider page of the application, scanned/emailed all to Dr Forde Dandy nurse so she can obtain his signature, date it and to fax all the [employer] pt asst program at fax number written on the cover letter included.     The pt states that he has 12 Entresto pills currently on hand and that he purchased a 30 day refill of Xarelto yesterday.  He is advised to contact [employer] Pt [employer] to ask questions concerning the status of his application for his Xarelto that was faxed to them on 6/29 and he is also advised to contact [employer] pt asst Foundation to see when they are mailing out his next [place] shipment. I offered to give him the phone numbers to reach [employer] and [employer] pt asst foundations but he states that he already has them and is calling them now.  He is aware to call us back if he needs further assistance from [place] to help him get his Xarelto and Entresto. He thanked me for calling him back to discuss.

## 2019-10-29 ENCOUNTER — Other Ambulatory Visit: Payer: Self-pay

## 2019-10-29 MED ORDER — ENTRESTO 24-26 MG PO TABS: 1.0000 | ORAL_TABLET | Freq: Two times a day (BID) | ORAL | 0 refills | Status: DC

## 2019-11-10 DIAGNOSIS — I4819 Other persistent atrial fibrillation: Secondary | ICD-10-CM | POA: Diagnosis not present

## 2019-11-10 DIAGNOSIS — C61 Malignant neoplasm of prostate: Secondary | ICD-10-CM | POA: Diagnosis not present

## 2019-11-10 DIAGNOSIS — I2510 Atherosclerotic heart disease of native coronary artery without angina pectoris: Secondary | ICD-10-CM | POA: Diagnosis not present

## 2019-11-10 DIAGNOSIS — E119 Type 2 diabetes mellitus without complications: Secondary | ICD-10-CM | POA: Diagnosis not present

## 2019-11-10 DIAGNOSIS — I428 Other cardiomyopathies: Secondary | ICD-10-CM | POA: Diagnosis not present

## 2019-11-10 DIAGNOSIS — R7309 Other abnormal glucose: Secondary | ICD-10-CM | POA: Diagnosis not present

## 2019-11-10 DIAGNOSIS — Z0000 Encounter for general adult medical examination without abnormal findings: Secondary | ICD-10-CM | POA: Diagnosis not present

## 2019-11-11 ENCOUNTER — Ambulatory Visit: Payer: Medicare HMO | Admitting: Podiatry

## 2019-11-11 ENCOUNTER — Encounter: Payer: Self-pay | Admitting: Podiatry

## 2019-11-11 ENCOUNTER — Other Ambulatory Visit: Payer: Self-pay

## 2019-11-11 DIAGNOSIS — B351 Tinea unguium: Secondary | ICD-10-CM | POA: Diagnosis not present

## 2019-11-11 DIAGNOSIS — D689 Coagulation defect, unspecified: Secondary | ICD-10-CM

## 2019-11-11 DIAGNOSIS — M79675 Pain in left toe(s): Secondary | ICD-10-CM

## 2019-11-11 DIAGNOSIS — M79674 Pain in right toe(s): Secondary | ICD-10-CM

## 2019-11-11 DIAGNOSIS — L84 Corns and callosities: Secondary | ICD-10-CM

## 2019-11-11 NOTE — Progress Notes (Signed)
This patient returns to my office for at risk foot care.  This patient requires this care by a professional since this patient will be at risk due to having coagulation defect.  Patient is taking eliquiss.  This patient is unable to cut nails himself since the patient cannot reach his nails.These nails are painful walking and wearing shoes.  This patient presents for at risk foot care today.  General Appearance  Alert, conversant and in no acute stress.  Vascular  Dorsalis pedis and posterior tibial  pulses are palpable  bilaterally.  Capillary return is within normal limits  bilaterally. Temperature is within normal limits  bilaterally.  Neurologic  Senn-Weinstein monofilament wire test within normal limits  bilaterally. Muscle power within normal limits bilaterally.  Nails Thick disfigured discolored nails with subungual debris  from hallux to fifth toes bilaterally. No evidence of bacterial infection or drainage bilaterally.  Orthopedic  No limitations of motion  feet .  No crepitus or effusions noted.  No bony pathology or digital deformities noted.  HAV  B/L.  Overlapping second digit  B/L.  Skin  normotropic skin with no porokeratosis noted bilaterally.  No signs of infections or ulcers noted.   Corn1/2  B/L.  Onychomycosis  Pain in right toes  Pain in left toes  Debride corn with # 15 blade.  Consent was obtained for treatment procedures.   Mechanical debridement of nails 1-5  bilaterally performed with a nail nipper.  Filed with dremel without incident. Debridement of callus with # 15 blade.   Return office visit  3 months                    Told patient to return for periodic foot care and evaluation due to potential at risk complications.   Gardiner Barefoot DPM

## 2019-11-12 DIAGNOSIS — I2510 Atherosclerotic heart disease of native coronary artery without angina pectoris: Secondary | ICD-10-CM | POA: Diagnosis not present

## 2019-11-12 DIAGNOSIS — I4819 Other persistent atrial fibrillation: Secondary | ICD-10-CM | POA: Diagnosis not present

## 2019-11-12 DIAGNOSIS — N400 Enlarged prostate without lower urinary tract symptoms: Secondary | ICD-10-CM | POA: Diagnosis not present

## 2019-11-12 DIAGNOSIS — C61 Malignant neoplasm of prostate: Secondary | ICD-10-CM | POA: Diagnosis not present

## 2019-11-12 DIAGNOSIS — E119 Type 2 diabetes mellitus without complications: Secondary | ICD-10-CM | POA: Diagnosis not present

## 2019-12-01 ENCOUNTER — Encounter: Payer: Medicare HMO | Attending: Internal Medicine | Admitting: Dietician

## 2019-12-01 ENCOUNTER — Other Ambulatory Visit: Payer: Self-pay

## 2019-12-01 DIAGNOSIS — E119 Type 2 diabetes mellitus without complications: Secondary | ICD-10-CM | POA: Insufficient documentation

## 2019-12-03 ENCOUNTER — Encounter: Payer: Self-pay | Admitting: Dietician

## 2019-12-03 NOTE — Progress Notes (Signed)
Patient was seen on 11/01/2019 for the first of a series of three diabetes self-management courses at [facility].  Patient Education Plan per assessed needs and concerns is to attend three course education program for Diabetes Self Management Education.  The following learning objectives were met by the patient during this class:  Describe diabetes, types of diabetes and pathophysiology  State some common risk factors for diabetes  Defines the role of glucose and insulin  Describe the relationship between diabetes and cardiovascular and other risks  State the members of the Healthcare Team  States the rationale for glucose monitoring and when to test  State their individual [place] the importance of logging glucose readings and how to interpret the readings  Identifies A1C target  Explain the correlation between A1c and eAG values  State symptoms and treatment of high blood glucose and low blood glucose  Explain proper technique for glucose testing and identify proper sharps disposal  Handouts given during class include:  How to Thrive:  A Guide for Your Journey with Diabetes by the ADA  Meal Plan Card and carbohydrate content list  Dietary intake form  Low Sodium Flavoring Tips  Types of Fats  Dining Out  Label reading  Snack list  Planning a balanced meal  The diabetes portion plate  Diabetes Resources  A1c to eAG Conversion Chart  Blood Glucose Log  Diabetes Recommended Care Schedule  Support Group  Diabetes Success Plan  Core Class Satisfaction Survey   Follow-Up Plan:  Attend core 2

## 2019-12-08 ENCOUNTER — Encounter: Payer: Self-pay | Admitting: Dietician

## 2019-12-08 ENCOUNTER — Other Ambulatory Visit: Payer: Self-pay

## 2019-12-08 ENCOUNTER — Encounter: Payer: Medicare HMO | Admitting: Dietician

## 2019-12-08 DIAGNOSIS — E119 Type 2 diabetes mellitus without complications: Secondary | ICD-10-CM | POA: Diagnosis not present

## 2019-12-08 NOTE — Progress Notes (Signed)
Patient was seen on 12/08/2019 for the second of a series of three diabetes self-management courses at the [facility]. The following learning objectives were met by the patient during this class:   Describe the role of different macronutrients on glucose  Explain how carbohydrates affect blood glucose  State what foods contain the most carbohydrates  Demonstrate carbohydrate counting  Demonstrate how to read Nutrition Facts food label  Describe effects of various fats on heart health  Describe the importance of good nutrition for health and healthy eating strategies  Describe techniques for managing your shopping, cooking and meal planning  List strategies to follow meal plan when dining out  Describe the effects of alcohol on glucose and how to use it safely  Goals:  Follow Diabetes Meal Plan as instructed  Aim to spread carbs evenly throughout the day  Aim for 3 meals per day and snacks as needed Include lean protein foods to meals/snacks  Monitor glucose levels as instructed by your doctor   Follow-Up Plan:  Attend Core 3  Work towards following your personal food plan.   

## 2019-12-15 ENCOUNTER — Encounter: Payer: Medicare HMO | Attending: Internal Medicine | Admitting: Dietician

## 2019-12-15 ENCOUNTER — Encounter: Payer: Self-pay | Admitting: Dietician

## 2019-12-15 ENCOUNTER — Other Ambulatory Visit: Payer: Self-pay

## 2019-12-15 DIAGNOSIS — E119 Type 2 diabetes mellitus without complications: Secondary | ICD-10-CM | POA: Diagnosis not present

## 2019-12-15 NOTE — Progress Notes (Signed)
Patient was seen on 12/15/2019 for the third of a series of three diabetes self-management courses at the [facility].   Jon Williams the amount of activity recommended for healthy living . Describe activities suitable for individual needs . Identify ways to regularly incorporate activity into daily life . Identify barriers to activity and ways to over come these barriers  Identify diabetes medications being personally used and their primary action for lowering glucose and possible side effects . Describe role of stress on blood glucose and develop strategies to address psychosocial issues . Identify diabetes complications and ways to prevent them  Explain how to manage diabetes during illness . Evaluate success in meeting personal goal . Establish 2-3 goals that they will plan to diligently work on  Goals:   I will count my carb choices at most meals and snacks  I will test my glucose at least 2 days a week  Your patient has identified these potential barriers to change:  Stress  Your patient has identified their diabetes self-care support plan as   [employer] Support Group    Plan:  Attend Support Group as desired

## 2020-01-11 ENCOUNTER — Ambulatory Visit (INDEPENDENT_AMBULATORY_CARE_PROVIDER_SITE_OTHER): Payer: Medicare HMO

## 2020-01-11 DIAGNOSIS — I428 Other cardiomyopathies: Secondary | ICD-10-CM

## 2020-01-11 LAB — CUP PACEART REMOTE DEVICE CHECK
Battery Remaining Longevity: 41 mo
Battery Remaining Percentage: 48 %
Battery Voltage: 2.95 V
Brady Statistic AP VP Percent: 94 %
Brady Statistic AP VS Percent: 1.9 %
Brady Statistic AS VP Percent: 1.8 %
Brady Statistic AS VS Percent: 1 %
Brady Statistic RA Percent Paced: 32 %
Brady Statistic RV Percent Paced: 92 %
Date Time Interrogation Session: 20211129020017
HighPow Impedance: 53 Ohm
HighPow Impedance: 53 Ohm
Implantable Lead Implant Date: 20180413
Implantable Lead Implant Date: 20200828
Implantable Lead Location: 753859
Implantable Lead Location: 753860
Implantable Lead Model: 7122
Implantable Pulse Generator Implant Date: 20180413
Lead Channel Impedance Value: 390 Ohm
Lead Channel Impedance Value: 400 Ohm
Lead Channel Pacing Threshold Amplitude: 0.75 V
Lead Channel Pacing Threshold Amplitude: 1 V
Lead Channel Pacing Threshold Pulse Width: 0.5 ms
Lead Channel Pacing Threshold Pulse Width: 0.5 ms
Lead Channel Sensing Intrinsic Amplitude: 3.5 mV
Lead Channel Sensing Intrinsic Amplitude: 6.4 mV
Lead Channel Setting Pacing Amplitude: 2 V
Lead Channel Setting Pacing Amplitude: 2.5 V
Lead Channel Setting Pacing Pulse Width: 0.5 ms
Lead Channel Setting Sensing Sensitivity: 0.5 mV
Pulse Gen Serial Number: 7398151

## 2020-01-15 NOTE — Progress Notes (Signed)
Remote ICD transmission.   

## 2020-01-19 NOTE — Telephone Encounter (Signed)
Patient states he is in the process of submitting a new application to [employer] for assistance with purchasing Entresto in the new year. He states he completed his portion of the application. However, he is requesting to have Dr. [name] complete the remainder. He states he is able to drop off the paperwork at the office at any time. Please return call to discuss.

## 2020-01-26 ENCOUNTER — Other Ambulatory Visit: Payer: Self-pay

## 2020-01-26 ENCOUNTER — Ambulatory Visit: Payer: Medicare HMO | Admitting: Internal Medicine

## 2020-01-26 ENCOUNTER — Telehealth: Payer: Self-pay

## 2020-01-26 VITALS — BP 108/76 | HR 77 | Ht 68.0 in | Wt 217.0 lb

## 2020-01-26 DIAGNOSIS — Z95810 Presence of automatic (implantable) cardiac defibrillator: Secondary | ICD-10-CM | POA: Diagnosis not present

## 2020-01-26 DIAGNOSIS — I4819 Other persistent atrial fibrillation: Secondary | ICD-10-CM | POA: Diagnosis not present

## 2020-01-26 DIAGNOSIS — I428 Other cardiomyopathies: Secondary | ICD-10-CM | POA: Diagnosis not present

## 2020-01-26 NOTE — Patient Instructions (Signed)
Medication Instructions:  °Your physician recommends that you continue on your current medications as directed. Please refer to the Current Medication list given to you today. ° °Labwork: °None ordered. ° °Testing/Procedures: °None ordered. ° °Follow-Up: °Your physician wants you to follow-up in: one year with Dr. [name].   You will receive a reminder letter in the mail two months in advance. If you don't receive a letter, please call our office to schedule the follow-up appointment. ° °Remote monitoring is used to monitor your ICD from home. This monitoring reduces the number of office visits required to check your device to one time per year. It allows us to keep an eye on the functioning of your device to ensure it is working properly. You are scheduled for a device check from home on 04/11/2020. You may send your transmission at any time that day. If you have a wireless device, the transmission will be sent automatically. After your physician reviews your transmission, you will receive a postcard with your next transmission date. ° °Any Other Special Instructions Will Be Listed Below (If Applicable). ° °If you need a refill on your cardiac medications before your next appointment, please call your pharmacy.  ° °

## 2020-01-26 NOTE — Telephone Encounter (Signed)
Pt brought [employer] Pt Asst Application by the office to be completed. Provider portion has been completed and give to Dr Lovena Le for signature.

## 2020-01-26 NOTE — Progress Notes (Signed)
HPI [name] returns today for followup. He is a pleasant 69 yo man with a h/o perisstent atrial fib, chronic systolic heart failure, s/p ICD insertion who developed LV lead dislodgement and attempts to re-insert the lead were unsuccessful. He had a very severly stenosed left subclavian vein. He has done well in the interim. He is exercising regularly and has not had chest pain or sob. No edema. No palpitations.  Allergies  Allergen Reactions  . Aspirin Anaphylaxis and Hives  . Sulfa Antibiotics Anaphylaxis and Hives     Current Outpatient Medications  Medication Sig Dispense Refill  . atorvastatin (LIPITOR) 40 MG tablet TAKE 1 TABLET(40 MG) BY MOUTH DAILY 90 tablet 1  . carvedilol (COREG) 3.125 MG tablet TAKE 1 TABLET(3.125 MG) BY MOUTH TWICE DAILY 180 tablet 2  . cholecalciferol (VITAMIN D3) 25 MCG (1000 UT) tablet Take 1,000 Units by mouth daily with lunch.     . furosemide (LASIX) 40 MG tablet TAKE 1 TABLET BY MOUTH TWICE DAILY 180 tablet 2  . JARDIANCE 10 MG TABS tablet Take 1 tablet by mouth daily.    . Multiple Vitamin (MULTIVITAMIN WITH MINERALS) TABS tablet Take 1 tablet by mouth daily with lunch. One-A-Day    . Naphazoline HCl (CLEAR EYES OP) Place 1 drop into both eyes daily as needed (irritation).    . potassium chloride (KLOR-CON) 10 MEQ tablet TAKE 1 TABLET(10 MEQ) BY MOUTH TWICE DAILY 180 tablet 3  . sacubitril-valsartan (ENTRESTO) 24-26 MG Take 1 tablet by mouth 2 (two) times daily. Please make yearly appt with Dr. Lovena Williams for November for future refills. 1st attempt 180 tablet 0  . sotalol (BETAPACE) 80 MG tablet TAKE 1 AND 1/2 TABLETS BY MOUTH TWICE DAILY 270 tablet 2  . XARELTO 20 MG TABS tablet TAKE 1 TABLET(20 MG) BY MOUTH DAILY WITH SUPPER 90 tablet 1   No current facility-administered medications for this visit.     Past Medical History:  Diagnosis Date  . AICD (automatic cardioverter/defibrillator) present 05/25/2016   biv icd  . Bunion, right   .  Chronic combined systolic and diastolic CHF (congestive heart failure) ([place])    Echo 1/18: Mild conc LVH, EF 15-20, severe diff HK, inf and inf-septal AK, Gr 3 DD, mild to mod MR, severe LAE, mod reduced RVSF, mod RAE, mild TR, PASP 50  . Coronary artery disease involving native coronary artery without angina pectoris 04/17/2016   LHC 1/18: pLCx 30, mLCx 20, mRCA 40, dRCA 20, LVEDP 23, mean RA 8, PA 42/20, PCWP 17  . Diabetes mellitus without complication ([place] [place])   . DJD (degenerative joint disease)   . History of atrial fibrillation   . History of atrial flutter   . History of cardiomegaly 06/07/2016   Noted on CXR  . History of colon polyps 06/28/2017   Noted on colonoscopy  . LBBB (left bundle branch block)   . NICM (nonischemic cardiomyopathy) ([place])    Echo 1/18:  Mild conc LVH, EF 15-20, severe diff HK, inf and inf-septal AK, Gr 3 DD, mild to mod MR, severe LAE, mod reduced RVSF, mod RAE, mild TR, PASP 50  . OA (osteoarthritis)    knee  . Other secondary pulmonary hypertension ([place]) 04/17/2016  . Prostate cancer ([place]) 2019  . Sigmoid diverticulosis 06/28/2017   Noted on colonoscopy    ROS:   All systems reviewed and negative except as noted in the HPI.   Past Surgical History:  Procedure Laterality Date  .  BIV ICD INSERTION CRT-D N/A 05/25/2016   Procedure: BiV ICD Insertion CRT-D;  Surgeon: Jon Lance, MD;  Location: [facility];  Service: Cardiovascular;  Laterality: N/A;  . CARDIAC CATHETERIZATION N/A 03/02/2016   Procedure: Right/Left Heart Cath and Coronary Angiography;  Surgeon: Jon Bush, MD;  Location: [facility];  Service: Cardiovascular;  Laterality: N/A;  . CARDIOVERSION N/A 07/17/2016   Procedure: Cardioversion;  Surgeon: Jon Lance, MD;  Location: [facility];  Service: Cardiovascular;  Laterality: N/A;  . COLONOSCOPY WITH PROPOFOL N/A 06/28/2017   Procedure: COLONOSCOPY WITH PROPOFOL;  Surgeon: Jon Ada, MD;  Location: [facility];  Service: Endoscopy;  Laterality: N/A;  . colonscopy  2009  . GOLD SEED IMPLANT N/A 12/24/2017   Procedure: GOLD SEED IMPLANT, TRANSERINEAL;  Surgeon: Festus Aloe, MD;  Location: [facility];  Service: Urology;  Laterality: N/A;  . INSERT / REPLACE / REMOVE PACEMAKER    . LEAD REVISION  10/10/2018  . LEAD REVISION/REPAIR N/A 10/10/2018   Procedure: LEAD REVISION/REPAIR;  Surgeon: Jon Lance, MD;  Location: [facility];  Service: Cardiovascular;  Laterality: N/A;  . POLYPECTOMY  06/28/2017   Procedure: POLYPECTOMY;  Surgeon: Jon Ada, MD;  Location: [facility];  Service: Endoscopy;;  ascending and descending colon polyp  . PROSTATE BIOPSY  02/20/2017  . SPACE OAR INSTILLATION N/A 12/24/2017   Procedure: SPACE OAR INSTILLATION;  Surgeon: Festus Aloe, MD;  Location: [facility];  Service: Urology;  Laterality: N/A;  . TOTAL KNEE ARTHROPLASTY Right 08/16/2017   Procedure: RIGHT TOTAL KNEE ARTHROPLASTY;  Surgeon: Jon Server, MD;  Location: [facility];  Service: Orthopedics;  Laterality: Right;     Family History  Problem Relation Age of Onset  . Hypertension Mother   . Heart disease Mother   . Diabetes Mother   . Diabetes Father   . Hypertension Father   . Cancer Father   . Healthy Sister   . Heart attack Brother   . Heart disease Brother 44       + tobacco  . Healthy Brother      Social History   Socioeconomic History  . Marital status: Married    Spouse name: Not on file  . Number of children: Not on file  . Years of education: Not on file  . Highest education level: Not on file  Occupational History  . Not on file  Tobacco Use  . Smoking status: Never Smoker  . Smokeless tobacco: Never Used  Vaping Use  . Vaping Use: Never used  Substance and Sexual Activity  . Alcohol use: No  . Drug use: No  . Sexual activity: Not Currently  Other Topics Concern  . Not on file  Social History Narrative   Retired Glass blower/designer. Married to Mrs.  Jon Williams. Daughter, Jon Williams, lives in [place]. Son, Jon Williams, lives in [place].   Social Determinants of Health   Financial Resource Strain: Not on file  Food Insecurity: Not on file  Transportation Needs: Not on file  Physical Activity: Not on file  Stress: Not on file  Social Connections: Not on file  Intimate Partner Violence: Not on file     BP 108/76   Pulse 77   Ht 5\' 8"  (1.727 m)   Wt 217 lb (98.4 kg)   SpO2 99%   BMI 32.99 kg/m   Physical Exam:  Well appearing NAD HEENT: Unremarkable Neck: 6 cm JVD, no thyromegally Lymphatics:  No adenopathy Back:  No CVA tenderness Lungs:  Clear with no wheezes; well healed PM insertion. HEART:  Regular rate rhythm, no murmurs, no rubs, no clicks Abd:  soft, positive bowel sounds, no organomegally, no rebound, no guarding Ext:  2 plus pulses, no edema, no cyanosis, no clubbing Skin:  No rashes no nodules Neuro:  CN II through XII intact, motor grossly intact  EKG - atrial fib with ventricular pacing  DEVICE  Normal device function.  See PaceArt for details.   Assess/Plan: 1. Atrial fib - his vr is well controlled. He denies palpitations.  2. Chronic systolic heart failure - his symptoms are class 2.  3. HTN - his bp is well controlled. No change in his meds. 4. ICD - his [employer] ICD is working normally.   Carleene Overlie Jemarion Roycroft,MD

## 2020-01-27 ENCOUNTER — Other Ambulatory Visit: Payer: Self-pay | Admitting: Internal Medicine

## 2020-01-27 ENCOUNTER — Telehealth: Payer: Self-pay | Admitting: Internal Medicine

## 2020-01-27 DIAGNOSIS — I2510 Atherosclerotic heart disease of native coronary artery without angina pectoris: Secondary | ICD-10-CM

## 2020-01-27 NOTE — Telephone Encounter (Signed)
Called pt to inform him that I would be leaving a bottle of Entresto 24-26 mg tablet samples at the front desk for pt to pick up and if he has any other problems, questions or concerns, to give our office a call back. Pt verbalized understanding.  Lot# [id number]  Exp: [phone]

## 2020-01-27 NOTE — Telephone Encounter (Signed)
    Patient calling the office for samples of medication:   1.  What medication and dosage are you requesting samples for?  sacubitril-valsartan (ENTRESTO) 24-26 MG     2.  Are you currently out of this medication? One 5 days left  Pt need samples while waiting for his pt assistance approval

## 2020-01-28 DIAGNOSIS — C61 Malignant neoplasm of prostate: Secondary | ICD-10-CM | POA: Diagnosis not present

## 2020-01-28 DIAGNOSIS — N400 Enlarged prostate without lower urinary tract symptoms: Secondary | ICD-10-CM | POA: Diagnosis not present

## 2020-01-28 DIAGNOSIS — E119 Type 2 diabetes mellitus without complications: Secondary | ICD-10-CM | POA: Diagnosis not present

## 2020-01-28 DIAGNOSIS — I2510 Atherosclerotic heart disease of native coronary artery without angina pectoris: Secondary | ICD-10-CM | POA: Diagnosis not present

## 2020-01-28 DIAGNOSIS — G8929 Other chronic pain: Secondary | ICD-10-CM | POA: Diagnosis not present

## 2020-01-28 DIAGNOSIS — I4819 Other persistent atrial fibrillation: Secondary | ICD-10-CM | POA: Diagnosis not present

## 2020-01-28 DIAGNOSIS — Z8546 Personal history of malignant neoplasm of prostate: Secondary | ICD-10-CM | POA: Diagnosis not present

## 2020-02-01 ENCOUNTER — Other Ambulatory Visit: Payer: Self-pay

## 2020-02-01 DIAGNOSIS — I4891 Unspecified atrial fibrillation: Secondary | ICD-10-CM

## 2020-02-01 MED ORDER — SOTALOL HCL 80 MG PO TABS: 120.0000 mg | ORAL_TABLET | Freq: Two times a day (BID) | ORAL | 3 refills | Status: DC

## 2020-02-01 NOTE — Telephone Encounter (Signed)
There's a note from 12/14 that he brought the paperwork by the office and it's in Dr Forde Dandy box for signature.

## 2020-02-01 NOTE — Telephone Encounter (Signed)
On 01/26/20, pt did bring paperwork into the office, given to April, [place] and Anderson Malta, Therapist, sports, to complete and fax to pt assistance program.

## 2020-02-01 NOTE — Telephone Encounter (Signed)
**Note De-Identified Nayden Czajka Obfuscation** The pt states that someone from our office has already helped him with this and that he dropped his application off at the office a couple weeks ago and was advised that we would fax it to [employer] for him. He thanked me for calling to check on him.

## 2020-02-02 ENCOUNTER — Other Ambulatory Visit: Payer: Self-pay

## 2020-02-02 DIAGNOSIS — I2510 Atherosclerotic heart disease of native coronary artery without angina pectoris: Secondary | ICD-10-CM

## 2020-02-02 MED ORDER — CARVEDILOL 3.125 MG PO TABS: ORAL_TABLET | ORAL | 3 refills | Status: DC

## 2020-02-02 MED ORDER — ATORVASTATIN CALCIUM 40 MG PO TABS: ORAL_TABLET | ORAL | 3 refills | Status: DC

## 2020-02-04 DIAGNOSIS — R350 Frequency of micturition: Secondary | ICD-10-CM | POA: Diagnosis not present

## 2020-02-04 DIAGNOSIS — N401 Enlarged prostate with lower urinary tract symptoms: Secondary | ICD-10-CM | POA: Diagnosis not present

## 2020-02-04 DIAGNOSIS — Z8546 Personal history of malignant neoplasm of prostate: Secondary | ICD-10-CM | POA: Diagnosis not present

## 2020-02-10 ENCOUNTER — Ambulatory Visit: Payer: Medicare HMO | Admitting: Podiatry

## 2020-02-10 ENCOUNTER — Other Ambulatory Visit: Payer: Self-pay

## 2020-02-10 ENCOUNTER — Encounter: Payer: Self-pay | Admitting: Podiatry

## 2020-02-10 DIAGNOSIS — L84 Corns and callosities: Secondary | ICD-10-CM

## 2020-02-10 DIAGNOSIS — B351 Tinea unguium: Secondary | ICD-10-CM | POA: Diagnosis not present

## 2020-02-10 DIAGNOSIS — M79675 Pain in left toe(s): Secondary | ICD-10-CM

## 2020-02-10 DIAGNOSIS — M79674 Pain in right toe(s): Secondary | ICD-10-CM | POA: Diagnosis not present

## 2020-02-10 DIAGNOSIS — D689 Coagulation defect, unspecified: Secondary | ICD-10-CM

## 2020-02-10 NOTE — Progress Notes (Addendum)
This patient returns to my office for at risk foot care.  This patient requires this care by a professional since this patient will be at risk due to having coagulation defect.  Patient is taking eliquiss.  This patient is unable to cut nails himself since the patient cannot reach his nails.These nails are painful walking and wearing shoes.  This patient presents for at risk foot care today.  General Appearance  Alert, conversant and in no acute stress.  Vascular  Dorsalis pedis  Are weakly palpable.   bilaterally. Posterior tibial pulses are absent  Bilateral. Capillary return is within normal limits  bilaterally. Temperature is within normal limits  Bilaterally.  Absent digital hair.  Neurologic  Senn-Weinstein monofilament wire test within normal limits  bilaterally. Muscle power within normal limits bilaterally.  Nails Thick disfigured discolored nails with subungual debris  from hallux to fifth toes bilaterally. No evidence of bacterial infection or drainage bilaterally.  Orthopedic  No limitations of motion  feet .  No crepitus or effusions noted.  No bony pathology or digital deformities noted.  HAV  B/L.  Overlapping second digit  B/L.  Skin  Dry scaly skin with no porokeratosis noted bilaterally.  No signs of infections or ulcers noted.   Corn1/2  B/L.  Onychomycosis  Pain in right toes  Pain in left toes  Debride corn with # 15 blade.  Consent was obtained for treatment procedures.   Mechanical debridement of nails 1-5  bilaterally performed with a nail nipper.  Filed with dremel without incident. Debridement of callus with # 15 blade.   Return office visit  3 months                    Told patient to return for periodic foot care and evaluation due to potential at risk complications.   Jon Williams DPM  

## 2020-02-12 ENCOUNTER — Other Ambulatory Visit: Payer: Self-pay | Admitting: Internal Medicine

## 2020-02-22 ENCOUNTER — Telehealth: Payer: Self-pay | Admitting: Internal Medicine

## 2020-02-22 ENCOUNTER — Other Ambulatory Visit: Payer: Self-pay

## 2020-02-22 ENCOUNTER — Encounter: Payer: Medicare HMO | Attending: Internal Medicine | Admitting: Dietician

## 2020-02-22 ENCOUNTER — Encounter: Payer: Self-pay | Admitting: Dietician

## 2020-02-22 DIAGNOSIS — E119 Type 2 diabetes mellitus without complications: Secondary | ICD-10-CM | POA: Diagnosis not present

## 2020-02-22 MED ORDER — POTASSIUM CHLORIDE ER 10 MEQ PO TBCR: EXTENDED_RELEASE_TABLET | ORAL | 3 refills | Status: DC

## 2020-02-22 NOTE — Patient Instructions (Addendum)
Great job on staying active and eating more vegetables Continue to choose foods that are low in fat. Decrease portion of chocolate.  Consider a snack size rather than full size or 2-3 chocolate nuggets or kisses. Rethink what you drink.  Overall aim to drink beverages that have zero carbohydrates most often.  Juice portion size is 4 ounces.  Choose fresh fruit rather than juice most often.

## 2020-02-22 NOTE — Progress Notes (Signed)
Appointment start:  1030 end:  1115  Patient attended Diabetes Core Classes 1-3 between 12/01/2019 and 12/15/2019 at [facility]. The purpose of the meeting today is to review information learned during those classes as well as review patient application and goals.   What are one or two positive things that you are doing right now to manage your diabetes?  Exercising daily for about 30 minutes (stretching, stationary bike, yoga), watching what he eats.  Eats at [employer], he has been reading labels more.  What is the hardest part about your diabetes right now, causing you the most concern, or is the most worrisome to you about your diabetes?  Avoiding chocolate and sugar is hard but has decreased since diagnosis  What questions do you have today?  What options for meats?  Currently avoiding red meat.  Have you participated in any diabetes support group?  no  History:  Includes Type 2 diabetes, non ischemic cardiomuopathy A1C:  7% 10/2019 Medications include:  Jardiance Weight:   213 lbs 02/21/2013 Wt Readings from Last 3 Encounters:  01/26/20 217 lb (98.4 kg)  12/03/19 212 lb (96.2 kg)  07/17/16 200 lb (90.7 kg)    Blood Glucose:  Not checking Blood glucose 112 in office today. He is unsure of desire to check but states that his wife is a Marine scientist.  Discussed benefits of checking several times per week to stay abreast of current blood glucose status. Provided meter to patient:  [employer], Lot [id number], exp:  09/11/2020   Social History:  Patient lives with his wife.  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   24 hour diet recall: Limiting fats, fast foods, sugar, limits salt Breakfast:  [place] Yogurt, granola, banana OR Multigrain cheerios, 2% milk Snack:  none Lunch:  Clean eats OR salad (kale, spinach, cucumbers, baked chicken, corn) Snack:  Celery, carrots, low fat dip OR tortilla chips and spinach dip Dinner:   Baked fish, vegetables, baked potato Snack:  Occasional similar to afternoon Beverages:  Water, hot chocolate (2% milk, unsweetened), OJ (8 oz) or pineapple juice (8 oz) or cherry juice  Specific focus but not limited to the following: . Review of blood glucose monitoring and interpretation including the recommended target ranges and Hgb A1c.  . Review of carbohydrate counting, importance of regularly scheduled meals/snacks, and meal planning to improve quality of diet. . Review of the effects of physical activity on glucose levels and long-term glucose control.  Recommended goal of 150 minutes of physical activity/week. . Review of patient medications and discussed role of medication on blood glucose and possible side effects. . Discussion of strategies to manage stress, psychosocial issues, and other obstacles to diabetes management. . Review of short-term complications: hyper- and hypo-glycemia (causes, symptoms, and treatment options) . Review of prevention, detection, and treatment of long-term complications.  Discussion of the role of prolonged elevated glucose levels on body systems.  Continuing Goals: Great job on staying active and eating more vegetables Continue to choose foods that are low in fat. Decrease portion of chocolate.  Consider a snack size rather than full size or 2-3 chocolate nuggets or kisses. Rethink what you drink.  Overall aim to drink beverages that have zero carbohydrates most often.  Juice portion size is 4 ounces.  Choose fresh fruit rather than juice most often.  Future Follow up:  prn

## 2020-02-22 NOTE — Telephone Encounter (Signed)
*  STAT* If patient is at the pharmacy, call can be transferred to refill team.   1. Which medications need to be refilled? (please list name of each medication and dose if known)   potassium chloride (KLOR-CON) 10 MEQ tablet   2. Which pharmacy/location (including street and city if local pharmacy) is medication to be sent to?  [facility] [phone] - [place], [place] AT [place]  3. Do they need a 30 day or 90 day supply? [place]

## 2020-03-03 ENCOUNTER — Other Ambulatory Visit: Payer: Self-pay | Admitting: Internal Medicine

## 2020-03-03 DIAGNOSIS — I4891 Unspecified atrial fibrillation: Secondary | ICD-10-CM

## 2020-03-03 NOTE — Telephone Encounter (Signed)
Prescription refill request for Xarelto received.  Indication: a fib Last office visit: Weight: 217# Age: 70  Scr: 1.32 CrCl: 71 mL.min

## 2020-03-07 ENCOUNTER — Telehealth: Payer: Self-pay | Admitting: Internal Medicine

## 2020-03-07 ENCOUNTER — Other Ambulatory Visit: Payer: Self-pay

## 2020-03-07 DIAGNOSIS — I4819 Other persistent atrial fibrillation: Secondary | ICD-10-CM | POA: Diagnosis not present

## 2020-03-07 DIAGNOSIS — I2510 Atherosclerotic heart disease of native coronary artery without angina pectoris: Secondary | ICD-10-CM | POA: Diagnosis not present

## 2020-03-07 DIAGNOSIS — G8929 Other chronic pain: Secondary | ICD-10-CM | POA: Diagnosis not present

## 2020-03-07 DIAGNOSIS — N400 Enlarged prostate without lower urinary tract symptoms: Secondary | ICD-10-CM | POA: Diagnosis not present

## 2020-03-07 DIAGNOSIS — C61 Malignant neoplasm of prostate: Secondary | ICD-10-CM | POA: Diagnosis not present

## 2020-03-07 DIAGNOSIS — E119 Type 2 diabetes mellitus without complications: Secondary | ICD-10-CM | POA: Diagnosis not present

## 2020-03-07 MED ORDER — FUROSEMIDE 40 MG PO TABS: 40.0000 mg | ORAL_TABLET | Freq: Two times a day (BID) | ORAL | 3 refills | Status: DC

## 2020-03-07 NOTE — Telephone Encounter (Signed)
*  STAT* If patient is at the pharmacy, call can be transferred to refill team.   1. Which medications need to be refilled? (please list name of each medication and dose if known)   furosemide (LASIX) 40 MG tablet   2. Which pharmacy/location (including street and city if local pharmacy) is medication to be sent to?[facility] [phone] - [place], [address] AT [address]  3. Do they need a 30 day or 90 day supply? 90 day supply

## 2020-04-11 ENCOUNTER — Ambulatory Visit (INDEPENDENT_AMBULATORY_CARE_PROVIDER_SITE_OTHER): Payer: Medicare HMO

## 2020-04-11 DIAGNOSIS — I428 Other cardiomyopathies: Secondary | ICD-10-CM | POA: Diagnosis not present

## 2020-04-12 LAB — CUP PACEART REMOTE DEVICE CHECK
Battery Remaining Longevity: 40 mo
Battery Remaining Percentage: 45 %
Battery Voltage: 2.93 V
Brady Statistic AP VP Percent: 0 %
Brady Statistic AP VS Percent: 0 %
Brady Statistic AS VP Percent: 0 %
Brady Statistic AS VS Percent: 0 %
Brady Statistic RA Percent Paced: 1 %
Brady Statistic RV Percent Paced: 90 %
Date Time Interrogation Session: 20220228020018
HighPow Impedance: 60 Ohm
HighPow Impedance: 60 Ohm
Implantable Lead Implant Date: 20180413
Implantable Lead Implant Date: 20200828
Implantable Lead Location: 753859
Implantable Lead Location: 753860
Implantable Lead Model: 7122
Implantable Pulse Generator Implant Date: 20180413
Lead Channel Impedance Value: 380 Ohm
Lead Channel Impedance Value: 410 Ohm
Lead Channel Pacing Threshold Amplitude: 0.75 V
Lead Channel Pacing Threshold Amplitude: 0.75 V
Lead Channel Pacing Threshold Pulse Width: 0.5 ms
Lead Channel Pacing Threshold Pulse Width: 0.5 ms
Lead Channel Sensing Intrinsic Amplitude: 2.1 mV
Lead Channel Sensing Intrinsic Amplitude: 6.4 mV
Lead Channel Setting Pacing Amplitude: 2 V
Lead Channel Setting Pacing Amplitude: 2.5 V
Lead Channel Setting Pacing Pulse Width: 0.5 ms
Lead Channel Setting Sensing Sensitivity: 0.5 mV
Pulse Gen Serial Number: 7398151

## 2020-04-19 NOTE — Progress Notes (Signed)
Remote ICD transmission.   

## 2020-05-04 DIAGNOSIS — E119 Type 2 diabetes mellitus without complications: Secondary | ICD-10-CM | POA: Diagnosis not present

## 2020-05-04 DIAGNOSIS — G8929 Other chronic pain: Secondary | ICD-10-CM | POA: Diagnosis not present

## 2020-05-04 DIAGNOSIS — I2510 Atherosclerotic heart disease of native coronary artery without angina pectoris: Secondary | ICD-10-CM | POA: Diagnosis not present

## 2020-05-04 DIAGNOSIS — C61 Malignant neoplasm of prostate: Secondary | ICD-10-CM | POA: Diagnosis not present

## 2020-05-04 DIAGNOSIS — N400 Enlarged prostate without lower urinary tract symptoms: Secondary | ICD-10-CM | POA: Diagnosis not present

## 2020-05-10 ENCOUNTER — Other Ambulatory Visit: Payer: Self-pay | Admitting: Internal Medicine

## 2020-05-11 ENCOUNTER — Other Ambulatory Visit: Payer: Self-pay

## 2020-05-11 ENCOUNTER — Ambulatory Visit: Payer: Medicare HMO | Admitting: Podiatry

## 2020-05-11 ENCOUNTER — Telehealth: Payer: Self-pay | Admitting: Internal Medicine

## 2020-05-11 ENCOUNTER — Encounter: Payer: Self-pay | Admitting: Podiatry

## 2020-05-11 DIAGNOSIS — L84 Corns and callosities: Secondary | ICD-10-CM

## 2020-05-11 DIAGNOSIS — D689 Coagulation defect, unspecified: Secondary | ICD-10-CM

## 2020-05-11 DIAGNOSIS — M79674 Pain in right toe(s): Secondary | ICD-10-CM

## 2020-05-11 DIAGNOSIS — M79675 Pain in left toe(s): Secondary | ICD-10-CM | POA: Diagnosis not present

## 2020-05-11 DIAGNOSIS — B351 Tinea unguium: Secondary | ICD-10-CM

## 2020-05-11 MED ORDER — ENTRESTO 24-26 MG PO TABS: 1.0000 | ORAL_TABLET | Freq: Two times a day (BID) | ORAL | 2 refills | Status: DC

## 2020-05-11 NOTE — Telephone Encounter (Signed)
Pt' medication was sent to pt's pharmacy as requested. Confirmation received.  

## 2020-05-11 NOTE — Telephone Encounter (Signed)
Spoke with pt and advised medication refill has been sent to pharmacy with confirmed receipt.  Pt verbalizes understanding and thanked Therapist, sports for the call.

## 2020-05-11 NOTE — Progress Notes (Signed)
This patient returns to my office for at risk foot care.  This patient requires this care by a professional since this patient will be at risk due to having coagulation defect.  Patient is taking eliquiss.  This patient is unable to cut nails himself since the patient cannot reach his nails.These nails are painful walking and wearing shoes.  This patient presents for at risk foot care today.  General Appearance  Alert, conversant and in no acute stress.  Vascular  Dorsalis pedis  Are weakly palpable.   bilaterally. Posterior tibial pulses are absent  Bilateral. Capillary return is within normal limits  bilaterally. Temperature is within normal limits  Bilaterally.  Absent digital hair.  Neurologic  Senn-Weinstein monofilament wire test within normal limits  bilaterally. Muscle power within normal limits bilaterally.  Nails Thick disfigured discolored nails with subungual debris  from hallux to fifth toes bilaterally. No evidence of bacterial infection or drainage bilaterally.  Orthopedic  No limitations of motion  feet .  No crepitus or effusions noted.  No bony pathology or digital deformities noted.  HAV  B/L.  Overlapping second digit  B/L.  Skin  Dry scaly skin with no porokeratosis noted bilaterally.  No signs of infections or ulcers noted.   Corn1/2  B/L.  Onychomycosis  Pain in right toes  Pain in left toes  Debride corn with # 15 blade.  Consent was obtained for treatment procedures.   Mechanical debridement of nails 1-5  bilaterally performed with a nail nipper.  Filed with dremel without incident. Debridement of callus with # 15 blade.   Return office visit  3 months                    Told patient to return for periodic foot care and evaluation due to potential at risk complications.   Gardiner Barefoot DPM

## 2020-05-11 NOTE — Telephone Encounter (Signed)
Patient is calling in regards to needing a refill on this medication. There is a refill in the system received by the pharmacy yesterday. Spoke with triage while still having the pt on the line and they stated they are currently looking into it and working on it, and requested this message be sent. Yee is also requesting it be arranged that he does not have to call in requesting a refill on this medication and the pharmacy sends it in to be filled when he is running low. Please advise.     *STAT* If patient is at the pharmacy, call can be transferred to refill team.   1. Which medications need to be refilled? (please list name of each medication and dose if known) sacubitril-valsartan (ENTRESTO) 24-26 MG  2. Which pharmacy/location (including street and city if local pharmacy) is medication to be sent to? [facility] [phone] - [place], [place] AT [place]  3. Do they need a 30 day or 90 day supply? 90 day supply

## 2020-07-11 LAB — CUP PACEART REMOTE DEVICE CHECK
Battery Remaining Longevity: 37 mo
Battery Remaining Percentage: 42 %
Battery Voltage: 2.93 V
Brady Statistic AP VP Percent: 0 %
Brady Statistic AP VS Percent: 0 %
Brady Statistic AS VP Percent: 100 %
Brady Statistic AS VS Percent: 0 %
Brady Statistic RA Percent Paced: 1 %
Brady Statistic RV Percent Paced: 91 %
Date Time Interrogation Session: 20220530030601
HighPow Impedance: 52 Ohm
HighPow Impedance: 52 Ohm
Implantable Lead Implant Date: 20180413
Implantable Lead Implant Date: 20200828
Implantable Lead Location: 753859
Implantable Lead Location: 753860
Implantable Lead Model: 7122
Implantable Pulse Generator Implant Date: 20180413
Lead Channel Impedance Value: 380 Ohm
Lead Channel Impedance Value: 380 Ohm
Lead Channel Pacing Threshold Amplitude: 0.75 V
Lead Channel Pacing Threshold Amplitude: 0.75 V
Lead Channel Pacing Threshold Pulse Width: 0.5 ms
Lead Channel Pacing Threshold Pulse Width: 0.5 ms
Lead Channel Sensing Intrinsic Amplitude: 2.1 mV
Lead Channel Sensing Intrinsic Amplitude: 7.9 mV
Lead Channel Setting Pacing Amplitude: 2 V
Lead Channel Setting Pacing Amplitude: 2.5 V
Lead Channel Setting Pacing Pulse Width: 0.5 ms
Lead Channel Setting Sensing Sensitivity: 0.5 mV
Pulse Gen Serial Number: 7398151

## 2020-07-12 ENCOUNTER — Ambulatory Visit (INDEPENDENT_AMBULATORY_CARE_PROVIDER_SITE_OTHER): Payer: Medicare HMO

## 2020-07-12 DIAGNOSIS — I428 Other cardiomyopathies: Secondary | ICD-10-CM | POA: Diagnosis not present

## 2020-07-29 DIAGNOSIS — Z8546 Personal history of malignant neoplasm of prostate: Secondary | ICD-10-CM | POA: Diagnosis not present

## 2020-08-03 NOTE — Progress Notes (Signed)
Remote ICD transmission.   

## 2020-08-05 DIAGNOSIS — Z8546 Personal history of malignant neoplasm of prostate: Secondary | ICD-10-CM | POA: Diagnosis not present

## 2020-08-11 DIAGNOSIS — N400 Enlarged prostate without lower urinary tract symptoms: Secondary | ICD-10-CM | POA: Diagnosis not present

## 2020-08-11 DIAGNOSIS — G8929 Other chronic pain: Secondary | ICD-10-CM | POA: Diagnosis not present

## 2020-08-11 DIAGNOSIS — I2510 Atherosclerotic heart disease of native coronary artery without angina pectoris: Secondary | ICD-10-CM | POA: Diagnosis not present

## 2020-08-11 DIAGNOSIS — E119 Type 2 diabetes mellitus without complications: Secondary | ICD-10-CM | POA: Diagnosis not present

## 2020-08-12 DIAGNOSIS — M545 Low back pain, unspecified: Secondary | ICD-10-CM | POA: Diagnosis not present

## 2020-08-12 DIAGNOSIS — R209 Unspecified disturbances of skin sensation: Secondary | ICD-10-CM | POA: Diagnosis not present

## 2020-08-17 ENCOUNTER — Ambulatory Visit: Payer: Medicare HMO | Admitting: Podiatry

## 2020-08-17 ENCOUNTER — Other Ambulatory Visit: Payer: Self-pay

## 2020-08-17 ENCOUNTER — Telehealth: Payer: Self-pay | Admitting: *Deleted

## 2020-08-17 DIAGNOSIS — M79675 Pain in left toe(s): Secondary | ICD-10-CM

## 2020-08-17 DIAGNOSIS — I739 Peripheral vascular disease, unspecified: Secondary | ICD-10-CM | POA: Diagnosis not present

## 2020-08-17 DIAGNOSIS — L84 Corns and callosities: Secondary | ICD-10-CM | POA: Diagnosis not present

## 2020-08-17 DIAGNOSIS — B351 Tinea unguium: Secondary | ICD-10-CM | POA: Diagnosis not present

## 2020-08-17 DIAGNOSIS — D689 Coagulation defect, unspecified: Secondary | ICD-10-CM

## 2020-08-17 DIAGNOSIS — M79674 Pain in right toe(s): Secondary | ICD-10-CM | POA: Diagnosis not present

## 2020-08-17 DIAGNOSIS — I73 Other peripheral vascular diseases: Secondary | ICD-10-CM | POA: Diagnosis not present

## 2020-08-17 NOTE — Telephone Encounter (Signed)
Faxed referral to [facility] , received confirmation 08/17/20

## 2020-08-17 NOTE — Progress Notes (Signed)
This patient returns to my office for at risk foot care.  This patient requires this care by a professional since this patient will be at risk due to having coagulation defect.  Patient is taking eliquiss.  This patient is unable to cut nails himself since the patient cannot reach his nails.These nails are painful walking and wearing shoes.   Patient says his legs and feet are cold and requires blankets at home. This patient presents for at risk foot care today.  General Appearance  Alert, conversant and in no acute stress.  Vascular  Dorsalis pedis  Are weakly palpable.   bilaterally. Posterior tibial pulses are absent  Bilateral. Capillary return is within normal limits  bilaterally. Temperature is within normal limits  Bilaterally.  Absent digital hair.  Neurologic  Senn-Weinstein monofilament wire test within normal limits  bilaterally. Muscle power within normal limits bilaterally.  Nails Thick disfigured discolored nails with subungual debris  from hallux to fifth toes bilaterally. No evidence of bacterial infection or drainage bilaterally.  Orthopedic  No limitations of motion  feet .  No crepitus or effusions noted.  No bony pathology or digital deformities noted.  HAV  B/L.  Overlapping second digit  B/L.  Skin  Dry scaly skin with no porokeratosis noted bilaterally.  No signs of infections or ulcers noted.   Corn1/2  B/L  Onychomycosis  Pain in right toes  Pain in left toes  Debride corn with # 15 blade.  Consent was obtained for treatment procedures.   Mechanical debridement of nails 1-5  bilaterally performed with a nail nipper.  Filed with dremel without incident. Debridement of callus with # 15 blade. Recommended vascular consult due to vascular studies and coldness.   Return office visit  3 months                    Told patient to return for periodic foot care and evaluation due to potential at risk complications.   Gardiner Barefoot DPM

## 2020-08-19 ENCOUNTER — Telehealth: Payer: Self-pay | Admitting: Internal Medicine

## 2020-08-19 NOTE — Telephone Encounter (Signed)
**Note De-Identified Alter Moss Obfuscation** No answer so I left a message on the pts VM asking him to call Jeani Hawking back at Dr Tanna Furry office at [facility] at [phone].

## 2020-08-19 NOTE — Telephone Encounter (Signed)
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 I assisted him in completing both of the applications and he states that he is bringing them both along with his financial document to Dr Forde Dandy office at [facility] on [address] to drop off.  He thanked me for my assistance.

## 2020-08-19 NOTE — Telephone Encounter (Signed)
New Message:     Patient said he would like to see if he can get assistant with his Xarelto and Entresto again please.Marland Kitchen

## 2020-08-24 ENCOUNTER — Telehealth: Payer: Self-pay | Admitting: Internal Medicine

## 2020-08-24 NOTE — Telephone Encounter (Signed)
Patient states he is returning a call from this morning. I did not see any notes.

## 2020-08-24 NOTE — Telephone Encounter (Signed)
**Note De-Identified [name] Obfuscation** The pt left a Jon Williams and Jon Williams for Jon Williams and a Jon Williams for Jon Williams.  He did not write in how many people live in his household on either [place] so I called him but got no ans so I LMTCB and he did and advised me that only he and his wife live in his home.  I added his household member amount, completed the provider page of both applications and have emailed all to Jon Williams nurse so she can obtain his signature on both, date them, and to fax to appropriate pt asst foundation at the fax number written on each cover letter included or to place in to be faxed box in Medical Records to be faxed.

## 2020-08-30 NOTE — Telephone Encounter (Signed)
Application faxed as requested.  Fax confirmation received.

## 2020-09-02 ENCOUNTER — Other Ambulatory Visit: Payer: Self-pay | Admitting: *Deleted

## 2020-09-02 DIAGNOSIS — M25569 Pain in unspecified knee: Secondary | ICD-10-CM

## 2020-09-05 ENCOUNTER — Other Ambulatory Visit: Payer: Self-pay | Admitting: Internal Medicine

## 2020-09-05 DIAGNOSIS — I4891 Unspecified atrial fibrillation: Secondary | ICD-10-CM

## 2020-09-05 NOTE — Telephone Encounter (Signed)
Xarelto '20mg'$  refill request received. Pt is 70 years old, weight-98.4kg, Crea-1.32 on 11/10/2019 via scanned labs from [place] PCP, last seen by Dr. Lovena Le on 01/26/2020, Diagnosis-Afib, CrCl-71.45m/min; Dose is appropriate based on dosing criteria. Will send in refill to requested pharmacy.

## 2020-09-05 NOTE — Telephone Encounter (Signed)
Pt last saw Dr Lovena Le 01/26/20, last labs 11/10/19 Creat 1.32, Age 70, weight 98.4, CrCl 71.44, based on CrCl pt is on appropriate dosage of Xarelto '20mg'$  QD.  Will refill rx.

## 2020-09-06 NOTE — Telephone Encounter (Addendum)
**Note De-Identified Jon Williams Obfuscation** Letter received Makayia Duplessis fax from [place] stating that they have approved the pt for asst with his [name] for the remainder of this year. Pt ID: [id number]  The letter states that they have notified the pt of this approval as well.

## 2020-09-07 ENCOUNTER — Telehealth: Payer: Self-pay | Admitting: *Deleted

## 2020-09-07 NOTE — Telephone Encounter (Signed)
Help with affording Xarelto:  If you are paying more than $85 for a 30-day supply or $240 for a 90-day supply of Xarelto, you may qualify for a program called Engineer, maintenance.   Through this program the same Xarelto will be delivered to your door by [facility] at a reduced price. There is no membership fee and you do not have to share income information. Register beginning April 1st and get refills until December 31st.   To register for the program, you can visit [email or web address] or call 888-XARELTO [phone]).   This pt calling in today down to 6 xarelto tablets and has sent in pt assistance forms and s/w Jeani Hawking Via, LPN.   Gave pt the number for the program mentioned above which was given out yesterday. Will send Jeani Hawking a message and leave samples up front for pt to pick up till pt can get this figured out.

## 2020-09-07 NOTE — Telephone Encounter (Signed)
**Note De-Identified [place] Kopf Obfuscation** I called [facility] and was advised by [place] that they have not received an application for this pt this year.  I will ask Dr Tanna Furry nurse to re-fax the pts application to [facility] and I have emailed his application to her again incase she does not have the original..

## 2020-09-08 NOTE — Telephone Encounter (Signed)
**Note De-Identified Shiron Whetsel Obfuscation** I have completed another provider page of a [employer] pt asst application for Xarelto and have e-mailed all to [place], Clinical Supervisor so she can obtain Dr Hassell Done (DOD) signature, date it, and to fax the pts entire application to [facility] at the fax number written on the cover letter included or to place in medical Records to be faxed.

## 2020-09-08 NOTE — Telephone Encounter (Signed)
Dr. Irish Lack has signed and dated the [facility] application. Patient's entire application has been faxed to [facility]. Confirmation received that fax was successful.

## 2020-09-09 ENCOUNTER — Other Ambulatory Visit: Payer: Self-pay

## 2020-09-09 ENCOUNTER — Ambulatory Visit (HOSPITAL_COMMUNITY)
Admission: RE | Admit: 2020-09-09 | Discharge: 2020-09-09 | Disposition: A | Discharge status: Home / Self Care | Payer: Medicare HMO | Source: Ambulatory Visit | Attending: Vascular Surgery | Admitting: Vascular Surgery

## 2020-09-09 ENCOUNTER — Ambulatory Visit (INDEPENDENT_AMBULATORY_CARE_PROVIDER_SITE_OTHER): Payer: Medicare HMO | Admitting: Vascular Surgery

## 2020-09-09 ENCOUNTER — Encounter: Payer: Self-pay | Admitting: Vascular Surgery

## 2020-09-09 VITALS — BP 100/67 | HR 60 | Temp 98.0°F | Resp 18 | Ht 66.0 in | Wt 201.0 lb

## 2020-09-09 DIAGNOSIS — M25569 Pain in unspecified knee: Secondary | ICD-10-CM

## 2020-09-09 NOTE — Progress Notes (Signed)
Patient ID: Jon Williams, male   DOB: 30-Mar-1950, 70 y.o.   MRN: [id number]  Reason for Consult: New Patient (Initial Visit) (Bilateral lower extremity pain and numbness with "coldness")   Referred by [name], MD  Subjective:     HPI:  Jon Williams is a 70 y.o. male without previous history of peripheral vascular disease does have history of coronary artery disease and diabetes mellitus as risk factors.  He was walking several miles a day but has had significant back pain that is limiting him in the recent weeks.  Per patient and his wife he has had cold feeling extremities.  He has had multiple podiatric procedures which have all healed well.  He denies any lower extremity claudication.  He does not have any tissue loss or ulceration.  He does not have a history of stroke, TIA or amaurosis.  No personal or family history of aneurysm disease.  Past Medical History:  Diagnosis Date   AICD (automatic cardioverter/defibrillator) present 05/25/2016   biv icd   Bunion, right    Chronic combined systolic and diastolic CHF (congestive heart failure) ([place])    Echo 1/18: Mild conc LVH, EF 15-20, severe diff HK, inf and inf-septal AK, Gr 3 DD, mild to mod MR, severe LAE, mod reduced RVSF, mod RAE, mild TR, PASP 50   Coronary artery disease involving native coronary artery without angina pectoris 04/17/2016   LHC 1/18: pLCx 80, mLCx 20, mRCA 40, dRCA 20, LVEDP 23, mean RA 8, PA 42/20, PCWP 17   Diabetes mellitus without complication (HCC)    DJD (degenerative joint disease)    History of atrial fibrillation    History of atrial flutter    History of cardiomegaly 06/07/2016   Noted on CXR   History of colon polyps 06/28/2017   Noted on colonoscopy   LBBB (left bundle branch block)    NICM (nonischemic cardiomyopathy) ([place])    Echo 1/18:  Mild conc LVH, EF 15-20, severe diff HK, inf and inf-septal AK, Gr 3 DD, mild to mod MR, severe LAE, mod reduced RVSF, mod RAE, mild TR, PASP 50    OA (osteoarthritis)    knee   Other secondary pulmonary hypertension ([place]) 04/17/2016   Prostate cancer ([place]) 2019   Sigmoid diverticulosis 06/28/2017   Noted on colonoscopy   Family History  Problem Relation Age of Onset   Hypertension Mother    Heart disease Mother    Diabetes Mother    Diabetes Father    Hypertension Father    Cancer Father    Healthy Sister    Heart attack Brother    Heart disease Brother 34       + tobacco   Healthy Brother    Past Surgical History:  Procedure Laterality Date   BIV ICD INSERTION CRT-D N/A 05/25/2016   Procedure: BiV ICD Insertion CRT-D;  Surgeon: [name], MD;  Location: [facility];  Service: Cardiovascular;  Laterality: N/A;   CARDIAC CATHETERIZATION N/A 03/02/2016   Procedure: Right/Left Heart Cath and Coronary Angiography;  Surgeon: Nelva Bush, MD;  Location: [facility];  Service: Cardiovascular;  Laterality: N/A;   CARDIOVERSION N/A 07/17/2016   Procedure: Cardioversion;  Surgeon: [name], MD;  Location: [facility];  Service: Cardiovascular;  Laterality: N/A;   COLONOSCOPY WITH PROPOFOL N/A 06/28/2017   Procedure: COLONOSCOPY WITH PROPOFOL;  Surgeon: [name], MD;  Location: [facility];  Service: Endoscopy;  Laterality: N/A;  colonscopy  2009   GOLD SEED IMPLANT N/A 12/24/2017   Procedure: GOLD SEED IMPLANT, TRANSERINEAL;  Surgeon: Festus Aloe, MD;  Location: [facility];  Service: Urology;  Laterality: N/A;   INSERT / REPLACE / REMOVE PACEMAKER     LEAD REVISION  10/10/2018   LEAD REVISION/REPAIR N/A 10/10/2018   Procedure: LEAD REVISION/REPAIR;  Surgeon: [name], MD;  Location: [facility];  Service: Cardiovascular;  Laterality: N/A;   POLYPECTOMY  06/28/2017   Procedure: POLYPECTOMY;  Surgeon: [name], MD;  Location: [facility];  Service: Endoscopy;;  ascending and descending colon polyp   PROSTATE BIOPSY  02/20/2017   SPACE OAR INSTILLATION N/A 12/24/2017    Procedure: SPACE OAR INSTILLATION;  Surgeon: Festus Aloe, MD;  Location: [facility];  Service: Urology;  Laterality: N/A;   TOTAL KNEE ARTHROPLASTY Right 08/16/2017   Procedure: RIGHT TOTAL KNEE ARTHROPLASTY;  Surgeon: [name], MD;  Location: [facility];  Service: Orthopedics;  Laterality: Right;    Short Social History:  Social History   Tobacco Use   Smoking status: Never   Smokeless tobacco: Never  Substance Use Topics   Alcohol use: No    Allergies  Allergen Reactions   Aspirin Anaphylaxis and Hives   Sulfa Antibiotics Anaphylaxis and Hives   Other Other (See Comments)    Current Outpatient Medications  Medication Sig Dispense Refill   atorvastatin (LIPITOR) 40 MG tablet TAKE 1 TABLET(40 MG) BY MOUTH DAILY 90 tablet 3   carvedilol (COREG) 3.125 MG tablet TAKE 1 TABLET(3.125 MG) BY MOUTH TWICE DAILY 180 tablet 3   cholecalciferol (VITAMIN D3) 25 MCG (1000 UT) tablet Take 1,000 Units by mouth daily with lunch.      furosemide (LASIX) 40 MG tablet Take 1 tablet (40 mg total) by mouth 2 (two) times daily. 180 tablet 3   JARDIANCE 10 MG TABS tablet Take 1 tablet by mouth daily.     metFORMIN (GLUCOPHAGE-XR) 500 MG 24 hr tablet      Multiple Vitamin (MULTIVITAMIN WITH MINERALS) TABS tablet Take 1 tablet by mouth daily with lunch. One-A-Day     Naphazoline HCl (CLEAR EYES OP) Place 1 drop into both eyes daily as needed (irritation).     potassium chloride (KLOR-CON) 10 MEQ tablet TAKE 1 TABLET(10 MEQ) BY MOUTH TWICE DAILY 180 tablet 3   sacubitril-valsartan (ENTRESTO) 24-26 MG Take 1 tablet by mouth 2 (two) times daily. 180 tablet 2   sotalol (BETAPACE) 80 MG tablet Take 1.5 tablets (120 mg total) by mouth 2 (two) times daily. 270 tablet 3   XARELTO 20 MG TABS tablet TAKE 1 TABLET(20 MG) BY MOUTH DAILY WITH SUPPER 90 tablet 1   No current facility-administered medications for this visit.    Review of Systems  Constitutional:  Constitutional negative. HENT: HENT negative.   Eyes: Eyes negative.  Cardiovascular: Cardiovascular negative.  GI: Gastrointestinal negative.  Musculoskeletal: Positive for back pain.  Skin: Skin negative.  Neurological: Neurological negative. Hematologic: Hematologic/lymphatic negative.  Psychiatric: Psychiatric negative.       Objective:  Objective   Vitals:   09/09/20 1556  BP: 100/67  Pulse: 60  Resp: 18  Temp: 98 F (36.7 C)  TempSrc: Temporal  SpO2: 98%  Weight: 201 lb (91.2 kg)  Height: '5\' 6"'$  (1.676 m)   Body mass index is 32.44 kg/m.  Physical Exam HENT:     Head: Normocephalic.     Nose:     Comments: Wearing a mask Eyes:  Pupils: Pupils are equal, round, and reactive to light.  Neck:     Vascular: No carotid bruit.  Cardiovascular:     Rate and Rhythm: Normal rate.     Pulses:          Radial pulses are 2+ on the right side and 2+ on the left side.       Popliteal pulses are 2+ on the right side and 2+ on the left side.       Dorsalis pedis pulses are 0 on the right side and 0 on the left side.       Posterior tibial pulses are 2+ on the right side and 2+ on the left side.  Pulmonary:     Effort: Pulmonary effort is normal.  Abdominal:     General: Abdomen is flat.     Palpations: Abdomen is soft. There is no mass.  Musculoskeletal:        General: No swelling or tenderness. Normal range of motion.  Skin:    General: Skin is warm and dry.     Capillary Refill: Capillary refill takes less than 2 seconds.  Neurological:     General: No focal deficit present.     Mental Status: He is alert.  Psychiatric:        Mood and Affect: Mood normal.        Behavior: Behavior normal.        Thought Content: Thought content normal.        Judgment: Judgment normal.    Data: ABI Findings:  +---------+------------------+-----+---------+--------+  Right    Rt Pressure (mmHg)IndexWaveform Comment   +---------+------------------+-----+---------+--------+  Brachial 101                                        +---------+------------------+-----+---------+--------+  ATA      60                0.59 triphasic          +---------+------------------+-----+---------+--------+  PTA      99                0.98 triphasic          +---------+------------------+-----+---------+--------+  Great Toe35                0.35 Abnormal           +---------+------------------+-----+---------+--------+   +---------+------------------+-----+---------+-------+  Left     Lt Pressure (mmHg)IndexWaveform Comment  +---------+------------------+-----+---------+-------+  Brachial 101                                      +---------+------------------+-----+---------+-------+  ATA      88                0.87 triphasic         +---------+------------------+-----+---------+-------+  PTA      104               1.03 triphasic         +---------+------------------+-----+---------+-------+  Great Toe35                0.35 Abnormal          +---------+------------------+-----+---------+-------+    Summary:  Right: Resting right ankle-brachial index is within normal range. No  evidence of significant right lower extremity  arterial disease. The right  toe-brachial index is abnormal.   Left: Resting left ankle-brachial index is within normal range. No  evidence of significant left lower extremity arterial disease. The left  toe-brachial index is abnormal.       Assessment/Plan:     70 year old male presents for cold lower extremities with normal ABIs and palpable posterior tibial pulses.  Most of his problems appear to be neurogenic possibly related to degenerative disc disease.  He can follow-up with me on an as-needed basis.  All of his questions were answered to his satisfaction     Waynetta Sandy MD [facility]

## 2020-09-30 DIAGNOSIS — G8929 Other chronic pain: Secondary | ICD-10-CM | POA: Diagnosis not present

## 2020-09-30 DIAGNOSIS — E119 Type 2 diabetes mellitus without complications: Secondary | ICD-10-CM | POA: Diagnosis not present

## 2020-09-30 DIAGNOSIS — I2510 Atherosclerotic heart disease of native coronary artery without angina pectoris: Secondary | ICD-10-CM | POA: Diagnosis not present

## 2020-09-30 DIAGNOSIS — N400 Enlarged prostate without lower urinary tract symptoms: Secondary | ICD-10-CM | POA: Diagnosis not present

## 2020-10-10 ENCOUNTER — Ambulatory Visit (INDEPENDENT_AMBULATORY_CARE_PROVIDER_SITE_OTHER): Payer: Medicare HMO

## 2020-10-10 DIAGNOSIS — I428 Other cardiomyopathies: Secondary | ICD-10-CM | POA: Diagnosis not present

## 2020-10-10 LAB — CUP PACEART REMOTE DEVICE CHECK
Battery Remaining Longevity: 35 mo
Battery Remaining Percentage: 38 %
Battery Voltage: 2.93 V
Brady Statistic AP VP Percent: 0 %
Brady Statistic AP VS Percent: 0 %
Brady Statistic AS VP Percent: 100 %
Brady Statistic AS VS Percent: 0 %
Brady Statistic RA Percent Paced: 1 %
Brady Statistic RV Percent Paced: 89 %
Date Time Interrogation Session: 20220829034059
HighPow Impedance: 54 Ohm
HighPow Impedance: 54 Ohm
Implantable Lead Implant Date: 20180413
Implantable Lead Implant Date: 20200828
Implantable Lead Location: 753859
Implantable Lead Location: 753860
Implantable Lead Model: 7122
Implantable Pulse Generator Implant Date: 20180413
Lead Channel Impedance Value: 380 Ohm
Lead Channel Impedance Value: 380 Ohm
Lead Channel Pacing Threshold Amplitude: 0.75 V
Lead Channel Pacing Threshold Amplitude: 0.75 V
Lead Channel Pacing Threshold Pulse Width: 0.5 ms
Lead Channel Pacing Threshold Pulse Width: 0.5 ms
Lead Channel Sensing Intrinsic Amplitude: 11.7 mV
Lead Channel Sensing Intrinsic Amplitude: 2.1 mV
Lead Channel Setting Pacing Amplitude: 2 V
Lead Channel Setting Pacing Amplitude: 2.5 V
Lead Channel Setting Pacing Pulse Width: 0.5 ms
Lead Channel Setting Sensing Sensitivity: 0.5 mV
Pulse Gen Serial Number: 7398151

## 2020-10-19 ENCOUNTER — Other Ambulatory Visit: Payer: Self-pay | Admitting: *Deleted

## 2020-10-19 DIAGNOSIS — I4891 Unspecified atrial fibrillation: Secondary | ICD-10-CM

## 2020-10-19 MED ORDER — RIVAROXABAN 20 MG PO TABS: 20.0000 mg | ORAL_TABLET | Freq: Every day | ORAL | 2 refills | Status: DC

## 2020-10-19 NOTE — Telephone Encounter (Signed)
Prescription refill request for Xarelto received.  Indication: afib  Last office visit: Lovena Le 01/26/2020 Weight: 91.2 kg  Age: 69 yo  Scr: 1.32, 11/10/2019 CrCl: 65 ml/min    Refill sent

## 2020-10-21 NOTE — Progress Notes (Signed)
Remote ICD transmission.   

## 2020-10-26 DIAGNOSIS — H5213 Myopia, bilateral: Secondary | ICD-10-CM | POA: Diagnosis not present

## 2020-10-26 DIAGNOSIS — H52223 Regular astigmatism, bilateral: Secondary | ICD-10-CM | POA: Diagnosis not present

## 2020-10-26 DIAGNOSIS — H524 Presbyopia: Secondary | ICD-10-CM | POA: Diagnosis not present

## 2020-11-01 DIAGNOSIS — K5900 Constipation, unspecified: Secondary | ICD-10-CM | POA: Diagnosis not present

## 2020-11-01 DIAGNOSIS — K2900 Acute gastritis without bleeding: Secondary | ICD-10-CM | POA: Diagnosis not present

## 2020-11-04 ENCOUNTER — Telehealth: Payer: Self-pay | Admitting: Internal Medicine

## 2020-11-04 ENCOUNTER — Other Ambulatory Visit: Payer: Self-pay | Admitting: Internal Medicine

## 2020-11-04 DIAGNOSIS — I2510 Atherosclerotic heart disease of native coronary artery without angina pectoris: Secondary | ICD-10-CM

## 2020-11-04 DIAGNOSIS — I4891 Unspecified atrial fibrillation: Secondary | ICD-10-CM

## 2020-11-04 NOTE — Telephone Encounter (Signed)
Patient was at the pharmacy and had questions regarding his medications. He was not specific as to which medication he needed.  Please advise

## 2020-11-04 NOTE — Telephone Encounter (Signed)
Called and spoke with the pt and he says he is okay and did not need anything he was having an issue with the pharmacy technician filling a med he did not need to have filled but he says it was worked out and he will let us know if he needs anything else.

## 2020-11-18 ENCOUNTER — Other Ambulatory Visit: Payer: Self-pay

## 2020-11-18 ENCOUNTER — Encounter: Payer: Self-pay | Admitting: Podiatry

## 2020-11-18 ENCOUNTER — Ambulatory Visit: Payer: Medicare HMO | Admitting: Podiatry

## 2020-11-18 DIAGNOSIS — B351 Tinea unguium: Secondary | ICD-10-CM

## 2020-11-18 DIAGNOSIS — I739 Peripheral vascular disease, unspecified: Secondary | ICD-10-CM

## 2020-11-18 DIAGNOSIS — L84 Corns and callosities: Secondary | ICD-10-CM | POA: Diagnosis not present

## 2020-11-18 DIAGNOSIS — M79674 Pain in right toe(s): Secondary | ICD-10-CM

## 2020-11-18 DIAGNOSIS — M79675 Pain in left toe(s): Secondary | ICD-10-CM | POA: Diagnosis not present

## 2020-11-18 DIAGNOSIS — D689 Coagulation defect, unspecified: Secondary | ICD-10-CM

## 2020-11-18 NOTE — Progress Notes (Signed)
This patient returns to my office for at risk foot care.  This patient requires this care by a professional since this patient will be at risk due to having coagulation defect.  Patient is taking eliquiss.  This patient is unable to cut nails himself since the patient cannot reach his nails.These nails are painful walking and wearing shoes.   Patient says his legs and feet are cold and requires blankets at home. This patient presents for at risk foot care today.  General Appearance  Alert, conversant and in no acute stress.  Vascular  Dorsalis pedis  Are weakly palpable.   bilaterally. Posterior tibial pulses are absent  Bilateral. Capillary return is within normal limits  bilaterally. Temperature is within normal limits  Bilaterally.  Absent digital hair.  Neurologic  Senn-Weinstein monofilament wire test within normal limits  bilaterally. Muscle power within normal limits bilaterally.  Nails Thick disfigured discolored nails with subungual debris  from hallux to fifth toes bilaterally. No evidence of bacterial infection or drainage bilaterally.  Orthopedic  No limitations of motion  feet .  No crepitus or effusions noted.  No bony pathology or digital deformities noted.  HAV  B/L.  Overlapping second digit  B/L.  Skin  Dry scaly skin with no porokeratosis noted bilaterally.  No signs of infections or ulcers noted.   Corn1/2  B/L  Onychomycosis  Pain in right toes  Pain in left toes  Debride corn with # 15 blade.  Consent was obtained for treatment procedures.   Mechanical debridement of nails 1-5  bilaterally performed with a nail nipper.  Filed with dremel without incident. Debridement of callus with # 15 blade.   Return office visit  3 months                    Told patient to return for periodic foot care and evaluation due to potential at risk complications.   Gardiner Barefoot DPM

## 2020-11-30 ENCOUNTER — Encounter (HOSPITAL_COMMUNITY): Payer: Self-pay | Admitting: Emergency Medicine

## 2020-11-30 ENCOUNTER — Other Ambulatory Visit: Payer: Self-pay

## 2020-11-30 ENCOUNTER — Inpatient Hospital Stay (HOSPITAL_COMMUNITY)
Admission: EM | Admit: 2020-11-30 | Discharge: 2020-12-02 | DRG: 375 | Disposition: A | Discharge status: Home / Self Care | Payer: Medicare HMO | Source: Ambulatory Visit | Attending: Internal Medicine | Admitting: Internal Medicine

## 2020-11-30 DIAGNOSIS — K922 Gastrointestinal hemorrhage, unspecified: Secondary | ICD-10-CM | POA: Diagnosis not present

## 2020-11-30 DIAGNOSIS — Z95810 Presence of automatic (implantable) cardiac defibrillator: Secondary | ICD-10-CM

## 2020-11-30 DIAGNOSIS — C165 Malignant neoplasm of lesser curvature of stomach, unspecified: Secondary | ICD-10-CM | POA: Diagnosis not present

## 2020-11-30 DIAGNOSIS — Z882 Allergy status to sulfonamides status: Secondary | ICD-10-CM

## 2020-11-30 DIAGNOSIS — K5730 Diverticulosis of large intestine without perforation or abscess without bleeding: Secondary | ICD-10-CM | POA: Diagnosis not present

## 2020-11-30 DIAGNOSIS — K31A0 Gastric intestinal metaplasia, unspecified: Secondary | ICD-10-CM | POA: Diagnosis not present

## 2020-11-30 DIAGNOSIS — I4819 Other persistent atrial fibrillation: Secondary | ICD-10-CM | POA: Diagnosis not present

## 2020-11-30 DIAGNOSIS — E785 Hyperlipidemia, unspecified: Secondary | ICD-10-CM | POA: Diagnosis not present

## 2020-11-30 DIAGNOSIS — I428 Other cardiomyopathies: Secondary | ICD-10-CM | POA: Diagnosis present

## 2020-11-30 DIAGNOSIS — I4891 Unspecified atrial fibrillation: Secondary | ICD-10-CM | POA: Diagnosis not present

## 2020-11-30 DIAGNOSIS — Z96651 Presence of right artificial knee joint: Secondary | ICD-10-CM | POA: Diagnosis present

## 2020-11-30 DIAGNOSIS — Z8249 Family history of ischemic heart disease and other diseases of the circulatory system: Secondary | ICD-10-CM | POA: Diagnosis not present

## 2020-11-30 DIAGNOSIS — Z886 Allergy status to analgesic agent status: Secondary | ICD-10-CM | POA: Diagnosis not present

## 2020-11-30 DIAGNOSIS — K921 Melena: Secondary | ICD-10-CM | POA: Diagnosis not present

## 2020-11-30 DIAGNOSIS — Z7901 Long term (current) use of anticoagulants: Secondary | ICD-10-CM | POA: Diagnosis not present

## 2020-11-30 DIAGNOSIS — I5022 Chronic systolic (congestive) heart failure: Secondary | ICD-10-CM | POA: Diagnosis not present

## 2020-11-30 DIAGNOSIS — D509 Iron deficiency anemia, unspecified: Secondary | ICD-10-CM | POA: Diagnosis not present

## 2020-11-30 DIAGNOSIS — E876 Hypokalemia: Secondary | ICD-10-CM | POA: Diagnosis present

## 2020-11-30 DIAGNOSIS — I959 Hypotension, unspecified: Secondary | ICD-10-CM | POA: Diagnosis not present

## 2020-11-30 DIAGNOSIS — Z8719 Personal history of other diseases of the digestive system: Secondary | ICD-10-CM | POA: Diagnosis not present

## 2020-11-30 DIAGNOSIS — I447 Left bundle-branch block, unspecified: Secondary | ICD-10-CM | POA: Diagnosis present

## 2020-11-30 DIAGNOSIS — Z0000 Encounter for general adult medical examination without abnormal findings: Secondary | ICD-10-CM | POA: Diagnosis not present

## 2020-11-30 DIAGNOSIS — Z833 Family history of diabetes mellitus: Secondary | ICD-10-CM

## 2020-11-30 DIAGNOSIS — E7800 Pure hypercholesterolemia, unspecified: Secondary | ICD-10-CM | POA: Diagnosis not present

## 2020-11-30 DIAGNOSIS — R0609 Other forms of dyspnea: Secondary | ICD-10-CM | POA: Diagnosis not present

## 2020-11-30 DIAGNOSIS — Z20822 Contact with and (suspected) exposure to covid-19: Secondary | ICD-10-CM | POA: Diagnosis not present

## 2020-11-30 DIAGNOSIS — K219 Gastro-esophageal reflux disease without esophagitis: Secondary | ICD-10-CM | POA: Diagnosis present

## 2020-11-30 DIAGNOSIS — I2510 Atherosclerotic heart disease of native coronary artery without angina pectoris: Secondary | ICD-10-CM | POA: Diagnosis present

## 2020-11-30 DIAGNOSIS — C61 Malignant neoplasm of prostate: Secondary | ICD-10-CM | POA: Diagnosis present

## 2020-11-30 DIAGNOSIS — I2729 Other secondary pulmonary hypertension: Secondary | ICD-10-CM | POA: Diagnosis not present

## 2020-11-30 DIAGNOSIS — Z79899 Other long term (current) drug therapy: Secondary | ICD-10-CM

## 2020-11-30 DIAGNOSIS — E119 Type 2 diabetes mellitus without complications: Secondary | ICD-10-CM | POA: Diagnosis present

## 2020-11-30 DIAGNOSIS — Z86010 Personal history of colonic polyps: Secondary | ICD-10-CM | POA: Diagnosis not present

## 2020-11-30 DIAGNOSIS — M47816 Spondylosis without myelopathy or radiculopathy, lumbar region: Secondary | ICD-10-CM | POA: Diagnosis not present

## 2020-11-30 DIAGNOSIS — D649 Anemia, unspecified: Secondary | ICD-10-CM

## 2020-11-30 DIAGNOSIS — E1169 Type 2 diabetes mellitus with other specified complication: Secondary | ICD-10-CM | POA: Diagnosis not present

## 2020-11-30 DIAGNOSIS — I480 Paroxysmal atrial fibrillation: Secondary | ICD-10-CM | POA: Diagnosis present

## 2020-11-30 DIAGNOSIS — R42 Dizziness and giddiness: Secondary | ICD-10-CM | POA: Diagnosis not present

## 2020-11-30 DIAGNOSIS — C269 Malignant neoplasm of ill-defined sites within the digestive system: Secondary | ICD-10-CM

## 2020-11-30 DIAGNOSIS — J181 Lobar pneumonia, unspecified organism: Secondary | ICD-10-CM | POA: Diagnosis not present

## 2020-11-30 DIAGNOSIS — C169 Malignant neoplasm of stomach, unspecified: Secondary | ICD-10-CM | POA: Diagnosis not present

## 2020-11-30 DIAGNOSIS — I110 Hypertensive heart disease with heart failure: Secondary | ICD-10-CM | POA: Diagnosis present

## 2020-11-30 DIAGNOSIS — C168 Malignant neoplasm of overlapping sites of stomach: Principal | ICD-10-CM | POA: Diagnosis present

## 2020-11-30 DIAGNOSIS — K429 Umbilical hernia without obstruction or gangrene: Secondary | ICD-10-CM | POA: Diagnosis not present

## 2020-11-30 DIAGNOSIS — K3189 Other diseases of stomach and duodenum: Secondary | ICD-10-CM | POA: Diagnosis not present

## 2020-11-30 DIAGNOSIS — M4316 Spondylolisthesis, lumbar region: Secondary | ICD-10-CM | POA: Diagnosis not present

## 2020-11-30 DIAGNOSIS — K2950 Unspecified chronic gastritis without bleeding: Secondary | ICD-10-CM | POA: Diagnosis not present

## 2020-11-30 DIAGNOSIS — D490 Neoplasm of unspecified behavior of digestive system: Secondary | ICD-10-CM | POA: Diagnosis not present

## 2020-11-30 LAB — CBC WITH DIFFERENTIAL/PLATELET
Abs Immature Granulocytes: 0.01 10*3/uL (ref 0.00–0.07)
Basophils Absolute: 0 10*3/uL (ref 0.0–0.1)
Basophils Relative: 0 %
Eosinophils Absolute: 0.1 10*3/uL (ref 0.0–0.5)
Eosinophils Relative: 2 %
HCT: 24.3 % — ABNORMAL LOW (ref 39.0–52.0)
Hemoglobin: 7.1 g/dL — ABNORMAL LOW (ref 13.0–17.0)
Immature Granulocytes: 0 %
Lymphocytes Relative: 17 %
Lymphs Abs: 1 10*3/uL (ref 0.7–4.0)
MCH: 23 pg — ABNORMAL LOW (ref 26.0–34.0)
MCHC: 29.2 g/dL — ABNORMAL LOW (ref 30.0–36.0)
MCV: 78.6 fL — ABNORMAL LOW (ref 80.0–100.0)
Monocytes Absolute: 0.5 10*3/uL (ref 0.1–1.0)
Monocytes Relative: 9 %
Neutro Abs: 4.1 10*3/uL (ref 1.7–7.7)
Neutrophils Relative %: 72 %
Platelets: 288 10*3/uL (ref 150–400)
RBC: 3.09 MIL/uL — ABNORMAL LOW (ref 4.22–5.81)
RDW: 18 % — ABNORMAL HIGH (ref 11.5–15.5)
WBC: 5.8 10*3/uL (ref 4.0–10.5)
nRBC: 0 % (ref 0.0–0.2)

## 2020-11-30 LAB — COMPREHENSIVE METABOLIC PANEL
ALT: 17 U/L (ref 0–44)
AST: 19 U/L (ref 15–41)
Albumin: 3.8 g/dL (ref 3.5–5.0)
Alkaline Phosphatase: 52 U/L (ref 38–126)
Anion gap: 7 (ref 5–15)
BUN: 19 mg/dL (ref 8–23)
CO2: 25 mmol/L (ref 22–32)
Calcium: 9.1 mg/dL (ref 8.9–10.3)
Chloride: 107 mmol/L (ref 98–111)
Creatinine, Ser: 1.15 mg/dL (ref 0.61–1.24)
GFR, Estimated: >60 mL/min (ref >60)
Glucose, Bld: 145 mg/dL — ABNORMAL HIGH (ref 70–99)
Potassium: 3.3 mmol/L — ABNORMAL LOW (ref 3.5–5.1)
Sodium: 139 mmol/L (ref 135–145)
Total Bilirubin: 0.6 mg/dL (ref 0.3–1.2)
Total Protein: 6.9 g/dL (ref 6.5–8.1)

## 2020-11-30 LAB — FOLATE: Folate: 28.4 ng/mL (ref >5.9)

## 2020-11-30 LAB — FERRITIN: Ferritin: 6 ng/mL — ABNORMAL LOW (ref 24–336)

## 2020-11-30 LAB — RETICULOCYTES
Immature Retic Fract: 36.6 % — ABNORMAL HIGH (ref 2.3–15.9)
RBC.: 2.96 MIL/uL — ABNORMAL LOW (ref 4.22–5.81)
Retic Count, Absolute: 32 10*3/uL (ref 19.0–186.0)
Retic Ct Pct: 1.1 % (ref 0.4–3.1)

## 2020-11-30 LAB — RESP PANEL BY RT-PCR (FLU A&B, COVID) ARPGX2
Influenza A by PCR: NEGATIVE
Influenza B by PCR: NEGATIVE
SARS Coronavirus 2 by RT PCR: NEGATIVE

## 2020-11-30 LAB — IRON AND TIBC
Iron: 13 ug/dL — ABNORMAL LOW (ref 45–182)
Saturation Ratios: 3 % — ABNORMAL LOW (ref 17.9–39.5)
TIBC: 420 ug/dL (ref 250–450)
UIBC: 407 ug/dL

## 2020-11-30 LAB — POC OCCULT BLOOD, ED: Fecal Occult Bld: POSITIVE — AB

## 2020-11-30 LAB — CBG MONITORING, ED: Glucose-Capillary: 115 mg/dL — ABNORMAL HIGH (ref 70–99)

## 2020-11-30 LAB — PREPARE RBC (CROSSMATCH)

## 2020-11-30 LAB — VITAMIN B12: Vitamin B-12: 438 pg/mL (ref 180–914)

## 2020-11-30 MED ORDER — INSULIN ASPART 100 UNIT/ML IJ SOLN
0.0000 [IU] | Freq: Three times a day (TID) | INTRAMUSCULAR | Status: DC
Start: 2020-11-30 — End: 2020-12-02
  Administered 2020-12-02: 2 [IU] via SUBCUTANEOUS

## 2020-11-30 MED ORDER — FUROSEMIDE 40 MG PO TABS
40.0000 mg | ORAL_TABLET | Freq: Two times a day (BID) | ORAL | Status: DC
  Administered 2020-12-01 – 2020-12-02 (×2): 40 mg via ORAL
  Filled 2020-11-30 (×2): qty 1

## 2020-11-30 MED ORDER — ONDANSETRON HCL 4 MG PO TABS: 4.0000 mg | ORAL_TABLET | Freq: Four times a day (QID) | ORAL | Status: DC | PRN

## 2020-11-30 MED ORDER — ATORVASTATIN CALCIUM 40 MG PO TABS
40.0000 mg | ORAL_TABLET | Freq: Every day | ORAL | Status: DC
  Administered 2020-12-02: 40 mg via ORAL
  Filled 2020-11-30: qty 1

## 2020-11-30 MED ORDER — CARVEDILOL 3.125 MG PO TABS
3.1250 mg | ORAL_TABLET | Freq: Two times a day (BID) | ORAL | Status: DC
  Administered 2020-11-30 – 2020-12-02 (×3): 3.125 mg via ORAL
  Filled 2020-11-30 (×3): qty 1

## 2020-11-30 MED ORDER — PANTOPRAZOLE SODIUM 40 MG PO TBEC
40.0000 mg | DELAYED_RELEASE_TABLET | Freq: Two times a day (BID) | ORAL | Status: DC
  Administered 2020-11-30 – 2020-12-02 (×3): 40 mg via ORAL
  Filled 2020-11-30 (×3): qty 1

## 2020-11-30 MED ORDER — ONDANSETRON HCL 4 MG/2ML IJ SOLN: 4.0000 mg | Freq: Four times a day (QID) | INTRAMUSCULAR | Status: DC | PRN

## 2020-11-30 MED ORDER — FUROSEMIDE 20 MG PO TABS: 40.0000 mg | ORAL_TABLET | Freq: Two times a day (BID) | ORAL | Status: DC

## 2020-11-30 MED ORDER — SACUBITRIL-VALSARTAN 24-26 MG PO TABS
1.0000 | ORAL_TABLET | Freq: Two times a day (BID) | ORAL | Status: DC
  Administered 2020-12-01 – 2020-12-02 (×2): 1 via ORAL
  Filled 2020-11-30 (×3): qty 1

## 2020-11-30 MED ORDER — POTASSIUM CHLORIDE CRYS ER 20 MEQ PO TBCR
40.0000 meq | EXTENDED_RELEASE_TABLET | ORAL | Status: AC
  Administered 2020-11-30: 40 meq via ORAL
  Filled 2020-11-30 (×2): qty 2

## 2020-11-30 MED ORDER — POTASSIUM CHLORIDE ER 10 MEQ PO TBCR
10.0000 meq | EXTENDED_RELEASE_TABLET | Freq: Every day | ORAL | Status: DC
  Administered 2020-12-02: 10 meq via ORAL
  Filled 2020-11-30 (×3): qty 1

## 2020-11-30 MED ORDER — SOTALOL HCL 80 MG PO TABS
40.0000 mg | ORAL_TABLET | Freq: Two times a day (BID) | ORAL | Status: DC
  Administered 2020-12-01 – 2020-12-02 (×2): 40 mg via ORAL
  Filled 2020-11-30 (×5): qty 0.5

## 2020-11-30 MED ORDER — SODIUM CHLORIDE 0.9 % IV SOLN
10.0000 mL/h | Freq: Once | INTRAVENOUS | Status: AC
  Administered 2020-11-30: 10 mL/h via INTRAVENOUS

## 2020-11-30 NOTE — ED Provider Notes (Signed)
[facility] EMERGENCY DEPARTMENT Provider Note   CSN: [id number] Arrival date & time: 11/30/20  1302     History Chief Complaint  Patient presents with   Abnormal Lab    Jon Williams is a 70 y.o. male.  The history is provided by the patient. No language interpreter was used.  Abnormal Lab  70 year old male significant history of atrial fibrillation currently on Xarelto, CAD, AICD, diabetes, sent here from PCP office for concerns of anemia.  Patient reports he was seen by his PCP today for an annual checkup.  Overall patient states he feels fine but does endorse occasional shortness of breath with exertion which has been an ongoing issue for the past 3 to 4 months.  He was sent home but then the office called and notified him that his blood count is low and he will need to be seen in the ED for further care.  Patient denies and noticing any active bleeding.  No dark tarry stools and no pain in his chest or abdomen.  No other complaint.  He has been compliant with his Xarelto.  Past Medical History:  Diagnosis Date   AICD (automatic cardioverter/defibrillator) present 05/25/2016   biv icd   Bunion, right    Chronic combined systolic and diastolic CHF (congestive heart failure) ([place])    Echo 1/18: Mild conc LVH, EF 15-20, severe diff HK, inf and inf-septal AK, Gr 3 DD, mild to mod MR, severe LAE, mod reduced RVSF, mod RAE, mild TR, PASP 50   Coronary artery disease involving native coronary artery without angina pectoris 04/17/2016   LHC 1/18: pLCx 41, mLCx 20, mRCA 40, dRCA 20, LVEDP 23, mean RA 8, PA 42/20, PCWP 17   Diabetes mellitus without complication (HCC)    DJD (degenerative joint disease)    History of atrial fibrillation    History of atrial flutter    History of cardiomegaly 06/07/2016   Noted on CXR   History of colon polyps 06/28/2017   Noted on colonoscopy   LBBB (left bundle branch block)    NICM (nonischemic cardiomyopathy) ([place])    Echo  1/18:  Mild conc LVH, EF 15-20, severe diff HK, inf and inf-septal AK, Gr 3 DD, mild to mod MR, severe LAE, mod reduced RVSF, mod RAE, mild TR, PASP 50   OA (osteoarthritis)    knee   Other secondary pulmonary hypertension ([place]) 04/17/2016   Prostate cancer ([place]) 2019   Sigmoid diverticulosis 06/28/2017   Noted on colonoscopy    Patient Active Problem List   Diagnosis Date Noted   Corns and callosities 08/07/2019   Blood clotting disorder ([place]) 05/06/2019   Coagulation defect ([place]) 05/06/2019   Pacemaker lead malfunction 10/10/2018   Pacemaker complications 54/65/6812   Pacemaker lead failure, sequela 10/10/2018   S/P knee replacement 08/16/2017   S/P total knee arthroplasty 08/16/2017   Malignant neoplasm of prostate ([place]) 04/02/2017   ICD (implantable cardioverter-defibrillator) in place    Persistent atrial fibrillation ([place]) 05/25/2016   Atrial fibrillation ([place]) 05/25/2016   Coronary artery disease involving native coronary artery without angina pectoris 04/17/2016   Other secondary pulmonary hypertension ([place]) 04/17/2016   Chronic combined systolic and diastolic CHF (congestive heart failure) (HCC)    NICM (nonischemic cardiomyopathy) ([place])    LBBB (left bundle branch block)     Past Surgical History:  Procedure Laterality Date   BIV ICD INSERTION CRT-D N/A 05/25/2016   Procedure: BiV ICD Insertion CRT-D;  Surgeon: Champ Mungo  Lovena Le, MD;  Location: [place] [facility];  Service: Cardiovascular;  Laterality: N/A;   CARDIAC CATHETERIZATION N/A 03/02/2016   Procedure: Right/Left Heart Cath and Coronary Angiography;  Surgeon: Nelva Bush, MD;  Location: [place] [facility];  Service: Cardiovascular;  Laterality: N/A;   CARDIOVERSION N/A 07/17/2016   Procedure: Cardioversion;  Surgeon: [name], MD;  Location: [place] [facility];  Service: Cardiovascular;  Laterality: N/A;   COLONOSCOPY WITH PROPOFOL N/A 06/28/2017   Procedure: COLONOSCOPY WITH PROPOFOL;  Surgeon: [name], MD;  Location: [facility];  Service: Endoscopy;  Laterality: N/A;   colonscopy  2009   GOLD SEED IMPLANT N/A 12/24/2017   Procedure: GOLD SEED IMPLANT, TRANSERINEAL;  Surgeon: Festus Aloe, MD;  Location: [facility];  Service: Urology;  Laterality: N/A;   INSERT / REPLACE / REMOVE PACEMAKER     LEAD REVISION  10/10/2018   LEAD REVISION/REPAIR N/A 10/10/2018   Procedure: LEAD REVISION/REPAIR;  Surgeon: [name], MD;  Location: [place] [facility];  Service: Cardiovascular;  Laterality: N/A;   POLYPECTOMY  06/28/2017   Procedure: POLYPECTOMY;  Surgeon: [name], MD;  Location: [facility];  Service: Endoscopy;;  ascending and descending colon polyp   PROSTATE BIOPSY  02/20/2017   SPACE OAR INSTILLATION N/A 12/24/2017   Procedure: SPACE OAR INSTILLATION;  Surgeon: Festus Aloe, MD;  Location: [facility];  Service: Urology;  Laterality: N/A;   TOTAL KNEE ARTHROPLASTY Right 08/16/2017   Procedure: RIGHT TOTAL KNEE ARTHROPLASTY;  Surgeon: [name], MD;  Location: [place];  Service: Orthopedics;  Laterality: Right;       Family History  Problem Relation Age of Onset   Hypertension Mother    Heart disease Mother    Diabetes Mother    Diabetes Father    Hypertension Father    Cancer Father    Healthy Sister    Heart attack Brother    Heart disease Brother 48       + tobacco   Healthy Brother     Social History   Tobacco Use   Smoking status: Never   Smokeless tobacco: Never  Vaping Use   Vaping Use: Never used  Substance Use Topics   Alcohol use: No   Drug use: No    Home Medications Prior to Admission medications   Medication Sig Start Date End Date Taking? Authorizing Provider  atorvastatin (LIPITOR) 40 MG tablet TAKE 1 TABLET(40 MG) BY MOUTH DAILY 11/04/20   [name], MD  carvedilol (COREG) 3.125 MG tablet TAKE 1 TABLET(3.125 MG) BY MOUTH TWICE DAILY 11/04/20   [name], MD  cholecalciferol (VITAMIN D3) 25 MCG (1000 UT) tablet Take  1,000 Units by mouth daily with lunch.     [provider]  furosemide (LASIX) 40 MG tablet Take 1 tablet (40 mg total) by mouth 2 (two) times daily. 03/07/20   [name], [name], MD  JARDIANCE 10 MG TABS tablet Take 1 tablet by mouth daily. 12/30/19   [provider]  metFORMIN (GLUCOPHAGE-XR) 500 MG 24 hr tablet  06/13/20   [provider]  Multiple Vitamin (MULTIVITAMIN WITH MINERALS) TABS tablet Take 1 tablet by mouth daily with lunch. One-A-Day    [provider]  Naphazoline HCl (CLEAR EYES OP) Place 1 drop into both eyes daily as needed (irritation).    [provider]  potassium chloride (KLOR-CON) 10 MEQ tablet TAKE 1 TABLET(10 MEQ) BY MOUTH TWICE DAILY 02/22/20   [name], MD  rivaroxaban (XARELTO) 20 MG TABS tablet Take 1 tablet (20 mg total) by mouth daily with supper. 10/19/20   [name], MD  sacubitril-valsartan (ENTRESTO) 24-26 MG Take 1 tablet by mouth 2 (two) times daily. 05/11/20   [name], MD  sotalol (BETAPACE) 80 MG tablet TAKE 1 AND 1/2 TABLETS(120 MG) BY MOUTH TWICE DAILY 11/04/20   [name], MD    Allergies    Aspirin, Sulfa antibiotics, and Other  Review of Systems   Review of Systems  All other systems reviewed and are negative.  Physical Exam Updated Vital Signs BP 113/63 (BP Location: Left Arm)   Pulse 89   Temp 97.8 F (36.6 C) (Oral)   Resp 18   SpO2 100%   Physical Exam Vitals and nursing note reviewed.  Constitutional:      General: He is not in acute distress.    Appearance: He is well-developed.  HENT:     Head: Atraumatic.  Eyes:     Conjunctiva/sclera: Conjunctivae normal.  Cardiovascular:     Rate and Rhythm: Normal rate and regular rhythm.     Pulses: Normal pulses.     Heart sounds: Normal heart sounds.  Pulmonary:     Effort: Pulmonary effort is normal.     Breath sounds: Normal breath sounds. No wheezing, rhonchi or rales.  Abdominal:     Palpations: Abdomen is soft.      Tenderness: There is no abdominal tenderness.  Genitourinary:    Comments: Chaperone present during exam.  Normal rectal tone, no obvious mass, normal color stool on glove. Musculoskeletal:        General: Normal range of motion.     Cervical back: Neck supple.  Skin:    Findings: No rash.  Neurological:     Mental Status: He is alert and oriented to person, place, and time.  Psychiatric:        Mood and Affect: Mood normal.    ED Results / Procedures / Treatments   Labs (all labs ordered are listed, but only abnormal results are displayed) Labs Reviewed  CBC WITH DIFFERENTIAL/PLATELET - Abnormal; Notable for the following components:      Result Value   RBC 3.09 (*)    Hemoglobin 7.1 (*)    HCT 24.3 (*)    MCV 78.6 (*)    MCH 23.0 (*)    MCHC 29.2 (*)    RDW 18.0 (*)    All other components within normal limits  COMPREHENSIVE METABOLIC PANEL - Abnormal; Notable for the following components:   Potassium 3.3 (*)    Glucose, Bld 145 (*)    All other components within normal limits  RETICULOCYTES - Abnormal; Notable for the following components:   RBC. 2.96 (*)    Immature Retic Fract 36.6 (*)    All other components within normal limits  POC OCCULT BLOOD, ED - Abnormal; Notable for the following components:   Fecal Occult Bld POSITIVE (*)    All other components within normal limits  RESP PANEL BY RT-PCR (FLU A&B, COVID) ARPGX2  VITAMIN B12  FOLATE  IRON AND TIBC  FERRITIN  TYPE AND SCREEN  PREPARE RBC (CROSSMATCH)    EKG None ED ECG REPORT   Date: 11/30/2020  Rate: 60  Rhythm: atrial fibrillation  QRS Axis: right  Intervals: QT prolonged  ST/T Wave abnormalities: nonspecific ST changes  Conduction Disutrbances:nonspecific intraventricular conduction delay  Narrative Interpretation:   Old EKG Reviewed: unchanged  I have personally  reviewed the EKG tracing and agree with the computerized printout as noted.   Radiology No results  found.  Procedures .Critical Care Performed by: Domenic Moras, PA-C Authorized by: Domenic Moras, PA-C   Critical care provider statement:    Critical care time (minutes):  31   Critical care was necessary to treat or prevent imminent or life-threatening deterioration of the following conditions:  Circulatory failure and shock   Critical care was time spent personally by me on the following activities:  Blood draw for specimens, development of treatment plan with patient or surrogate, discussions with consultants, evaluation of patient's response to treatment, examination of patient, obtaining history from patient or surrogate, ordering and performing treatments and interventions, ordering and review of laboratory studies, ordering and review of radiographic studies, pulse oximetry, re-evaluation of patient's condition and review of old charts   I assumed direction of critical care for this patient from another provider in my specialty: no     Care discussed with: admitting provider     Medications Ordered in ED Medications  0.9 %  sodium chloride infusion (has no administration in time range)  potassium chloride SA (KLOR-CON) CR tablet 40 mEq (has no administration in time range)    ED Course  I have reviewed the triage vital signs and the nursing notes.  Pertinent labs & imaging results that were available during my care of the patient were reviewed by me and considered in my medical decision making (see chart for details).    MDM Rules/Calculators/A&P                           BP 104/61   Pulse 67   Temp 97.8 F (36.6 C) (Oral)   Resp (!) 22   SpO2 100%   Final Clinical Impression(s) / ED Diagnoses Final diagnoses:  Symptomatic anemia  Gastrointestinal hemorrhage, unspecified gastrointestinal hemorrhage type    Rx / DC Orders ED Discharge Orders     None      2:18 PM Patient recommended to be seen in the ED after he had a annual checkup at his PCP office earlier  today and blood work shows a hemoglobin of 7.4.  He does endorse some exertional shortness of breath which has been ongoing for the past 3 to 4 months.  Denies any active bleeding but is currently on Xarelto for A. fib.  Patient overall well-appearing, Hemoccult obtained without any frank blood.  I have ordered an anemia panel, will transfuse blood product and consult patient for admission.  Care discussed with Dr. Almyra Free.   2:52 PM Pt has normal appearing stool color however it is fecal occult blood positive, will consult GI.  Appreciate consultation from [facility] Dr. Roosevelt Locks who agrees to see and will admit pt.  I have ordered blood product.  Pt is agreeing with blood transfusion.    3:18 PM Appreciate consultation from GI specialist Dr. Benson Norway who agrees to be involve in pt care.    Domenic Moras, PA-C 11/30/20 1521    Luna Fuse, MD 12/02/20 [phone]

## 2020-11-30 NOTE — H&P (View-Only) (Signed)
Reason for Consult: Iron deficiency anemia with guaiac t positive stools. Referring Physician: ER MD  Jon Williams is an 70 y.o. male.  HPI: Jon Williams is a 70 year old black male with multiple medical problems listed below presented to the emergency room today after he was sent to the ER by his PCP after he was noted to have severe anemia with a hemoglobin of 7.4 g/dL on a routine physical.  Patient claims she was having some abdominal pain and acid reflux with nausea about 4 weeks ago when he visited Dr. Wilhemina Bonito and was found to be H. pylori positive in a practice and was treated with Amoxicillin, Omeprazole and Clarithromycin. He claims his abdominal pain and reflux improved after treatment with above-mentioned medications. He denies having any melena hematochezia. Patient admits to having some shortness of breath with exertion and some palpitations in the last 3 to 4 months he is on Xarelto for history of atrial fibrillation and he took his last dose this morning. He has 2 BM's per day but however that over the last few weeks has been having 3-4 loose BM's per day with no blood or mucus in the stool.  In the emergency room on admission the patient was found to have a hemoglobin of 7.1 g/dL with a ferritin of 6 and iron of 13. His last colonoscopy was done on 06/28/2017 by Dr. Saralyn Pilar and when a couple of adenomatous polyps were removed and sigmoid diverticulosis was noted. Patient denies having any nausea, vomiting, dysphagia or odynophagia at this time. He is receiving the first unit of packed red blood cells at the present time.  Past Medical History:  Diagnosis Date   AICD (automatic cardioverter/defibrillator) present 05/25/2016   biv icd   Bunion, right    Chronic combined systolic and diastolic CHF (congestive heart failure) ([place])    Echo 1/18: Mild conc LVH, EF 15-20, severe diff HK, inf and inf-septal AK, Gr 3 DD, mild to mod MR, severe LAE, mod reduced RVSF, mod RAE, mild  TR, PASP 50   Coronary artery disease involving native coronary artery without angina pectoris 04/17/2016   LHC 1/18: pLCx 26, mLCx 20, mRCA 40, dRCA 20, LVEDP 23, mean RA 8, PA 42/20, PCWP 17   Diabetes mellitus without complication (HCC)    DJD (degenerative joint disease)    History of atrial fibrillation    History of atrial flutter    History of cardiomegaly 06/07/2016   Noted on CXR   History of colon polyps 06/28/2017   Noted on colonoscopy   LBBB (left bundle branch block)    NICM (nonischemic cardiomyopathy) ([place])    Echo 1/18:  Mild conc LVH, EF 15-20, severe diff HK, inf and inf-septal AK, Gr 3 DD, mild to mod MR, severe LAE, mod reduced RVSF, mod RAE, mild TR, PASP 50   OA (osteoarthritis)    knee   Other secondary pulmonary hypertension ([place]) 04/17/2016   Prostate cancer ([place]) 2019   Sigmoid diverticulosis 06/28/2017   Noted on colonoscopy   Past Surgical History:  Procedure Laterality Date   BIV ICD INSERTION CRT-D N/A 05/25/2016   Procedure: BiV ICD Insertion CRT-D;  Surgeon: [name], MD;  Location: [facility];  Service: Cardiovascular;  Laterality: N/A;   CARDIAC CATHETERIZATION N/A 03/02/2016   Procedure: Right/Left Heart Cath and Coronary Angiography;  Surgeon: Nelva Bush, MD;  Location: [facility];  Service: Cardiovascular;  Laterality: N/A;   CARDIOVERSION N/A 07/17/2016  Procedure: Cardioversion;  Surgeon: [name], MD;  Location: [facility];  Service: Cardiovascular;  Laterality: N/A;   COLONOSCOPY WITH PROPOFOL N/A 06/28/2017   Procedure: COLONOSCOPY WITH PROPOFOL;  Surgeon: [name], MD;  [facility];  Service: Endoscopy;  Laterality: N/A;   colonscopy  2009   GOLD SEED IMPLANT N/A 12/24/2017   Procedure: GOLD SEED IMPLANT, TRANSERINEAL;  Surgeon: Festus Aloe, MD;  [facility];  Service: Urology;  Laterality: N/A;   INSERT / REPLACE / REMOVE PACEMAKER     LEAD REVISION  10/10/2018   LEAD  REVISION/REPAIR N/A 10/10/2018   Procedure: LEAD REVISION/REPAIR;  Surgeon: [name], MD;  Location: [facility];  Service: Cardiovascular;  Laterality: N/A;   POLYPECTOMY  06/28/2017   Procedure: POLYPECTOMY;  Surgeon: [name], MD;  [facility];  Service: Endoscopy;;  ascending and descending colon polyp   PROSTATE BIOPSY  02/20/2017   SPACE OAR INSTILLATION N/A 12/24/2017   Procedure: SPACE OAR INSTILLATION;  Surgeon: Festus Aloe, MD;  [facility];  Service: Urology;  Laterality: N/A;   TOTAL KNEE ARTHROPLASTY Right 08/16/2017   Procedure: RIGHT TOTAL KNEE ARTHROPLASTY;  Surgeon: [name], MD;  Location: [facility];  Service: Orthopedics;  Laterality: Right;   Family History  Problem Relation Age of Onset   Hypertension Mother    Heart disease Mother    Diabetes Mother    Diabetes Father    Hypertension Father    Cancer Father    Healthy Sister    Heart attack Brother    Heart disease Brother 14       + tobacco   Healthy Brother    Social History:  reports that he has never smoked. He has never used smokeless tobacco. He reports that he does not drink alcohol and does not use drugs.  Allergies:  Allergies  Allergen Reactions   Aspirin Anaphylaxis and Hives   Sulfa Antibiotics Anaphylaxis and Hives   Other Other (See Comments)    Medications: I have reviewed the patient's current medications.  Results for orders placed or performed during the hospital encounter of 11/30/20 (from the past 48 hour(s))  Type and screen [facility]     Status: None (Preliminary result)   Collection Time: 11/30/20  1:20 PM  Result Value Ref Range   ABO/RH(D) O POS    Antibody Screen NEG    Sample Expiration 12/03/2020,2359    Unit Number [id number]    Blood Component Type RED CELLS,LR    Unit division 00    Status of Unit ISSUED    Transfusion Status OK TO TRANSFUSE    Crossmatch Result      Compatible Performed at [facility], [place] [address] [place], [place] [address]   CBC with Differential     Status: Abnormal   Collection Time: 11/30/20  1:34 PM  Result Value Ref Range   WBC 5.8 4.0 - 10.5 K/uL   RBC 3.09 (L) 4.22 - 5.81 MIL/uL   Hemoglobin 7.1 (L) 13.0 - 17.0 g/dL   HCT 24.3 (L) 39.0 - 52.0 %   MCV 78.6 (L) 80.0 - 100.0 fL   MCH 23.0 (L) 26.0 - 34.0 pg   MCHC 29.2 (L) 30.0 - 36.0 g/dL   RDW 18.0 (H) 11.5 - 15.5 %   Platelets 288 150 - 400 K/uL   nRBC 0.0 0.0 - 0.2 %   Neutrophils Relative % 72 %  Neutro Abs 4.1 1.7 - 7.7 K/uL   Lymphocytes Relative 17 %   Lymphs Abs 1.0 0.7 - 4.0 K/uL   Monocytes Relative 9 %   Monocytes Absolute 0.5 0.1 - 1.0 K/uL   Eosinophils Relative 2 %   Eosinophils Absolute 0.1 0.0 - 0.5 K/uL   Basophils Relative 0 %   Basophils Absolute 0.0 0.0 - 0.1 K/uL   Immature Granulocytes 0 %   Abs Immature Granulocytes 0.01 0.00 - 0.07 K/uL    Comment: Performed at [facility] [address], [place], [place] [address]  Comprehensive metabolic panel     Status: Abnormal   Collection Time: 11/30/20  1:34 PM  Result Value Ref Range   Sodium 139 135 - 145 mmol/L   Potassium 3.3 (L) 3.5 - 5.1 mmol/L   Chloride 107 98 - 111 mmol/L   CO2 25 22 - 32 mmol/L   Glucose, Bld 145 (H) 70 - 99 mg/dL    Comment: Glucose reference range applies only to samples taken after fasting for at least 8 hours.   BUN 19 8 - 23 mg/dL   Creatinine, Ser 1.15 0.61 - 1.24 mg/dL   Calcium 9.1 8.9 - 10.3 mg/dL   Total Protein 6.9 6.5 - 8.1 g/dL   Albumin 3.8 3.5 - 5.0 g/dL   AST 19 15 - 41 U/L   ALT 17 0 - 44 U/L   Alkaline Phosphatase 52 38 - 126 U/L   Total Bilirubin 0.6 0.3 - 1.2 mg/dL   GFR, Estimated >60 >60 mL/min    Comment: (NOTE) Calculated using the CKD-EPI Creatinine Equation (2021)    Anion gap 7 5 - 15    Comment: Performed at [facility] [address], [place], [place] [address]  Reticulocytes     Status: Abnormal   Collection Time: 11/30/20  2:08 PM   Result Value Ref Range   Retic Ct Pct 1.1 0.4 - 3.1 %   RBC. 2.96 (L) 4.22 - 5.81 MIL/uL   Retic Count, Absolute 32.0 19.0 - 186.0 K/uL   Immature Retic Fract 36.6 (H) 2.3 - 15.9 %    Comment: Performed at [facility] [address] [place], [place] [address]  Prepare RBC (crossmatch)     Status: None   Collection Time: 11/30/20  2:15 PM  Result Value Ref Range   Order Confirmation      ORDER PROCESSED BY BLOOD BANK Performed at [facility], [place] [address], [place],  [address]   Resp Panel by RT-PCR (Flu A&B, Covid) Nasopharyngeal Swab     Status: None   Collection Time: 11/30/20  2:17 PM   Specimen: Nasopharyngeal Swab; Nasopharyngeal(NP) swabs in vial transport medium  Result Value Ref Range   SARS Coronavirus 2 by RT PCR NEGATIVE NEGATIVE    Comment: (NOTE) SARS-CoV-2 target nucleic acids are NOT DETECTED.  The SARS-CoV-2 RNA is generally detectable in upper respiratory specimens during the acute phase of infection. The lowest concentration of SARS-CoV-2 viral copies this assay can detect is 138 copies/mL. A negative result does not preclude SARS-Cov-2 infection and should not be used as the sole basis for treatment or other patient management decisions. A negative result may occur with  improper specimen collection/handling, submission of specimen other than nasopharyngeal swab, presence of viral mutation(s) within the areas targeted by this assay, and inadequate number of viral copies(<138 copies/mL). A negative result must be combined with clinical observations, patient history, and  epidemiological information. The expected result is Negative.  Fact Sheet for Patients:  EntrepreneurPulse.com.au  Fact Sheet for Healthcare Providers:  IncredibleEmployment.be  This test is no t yet approved or cleared by the [place] FDA and  has been authorized for detection and/or diagnosis of SARS-CoV-2 by FDA under an  Emergency Use Authorization (EUA). This EUA will remain  in effect (meaning this test can be used) for the duration of the COVID-19 declaration under Section 564(b)(1) of the Act, 21 U.S.C.section 360bbb-3(b)(1), unless the authorization is terminated  or revoked sooner.       Influenza A by PCR NEGATIVE NEGATIVE   Influenza B by PCR NEGATIVE NEGATIVE    Comment: (NOTE) The Xpert Xpress SARS-CoV-2/FLU/RSV plus assay is intended as an aid in the diagnosis of influenza from Nasopharyngeal swab specimens and should not be used as a sole basis for treatment. Nasal washings and aspirates are unacceptable for Xpert Xpress SARS-CoV-2/FLU/RSV testing.  Fact Sheet for Patients: EntrepreneurPulse.com.au  Fact Sheet for Healthcare Providers: IncredibleEmployment.be  This test is not yet approved or cleared by the [place] FDA and has been authorized for detection and/or diagnosis of SARS-CoV-2 by FDA under an Emergency Use Authorization (EUA). This EUA will remain in effect (meaning this test can be used) for the duration of the COVID-19 declaration under Section 564(b)(1) of the Act, 21 U.S.C. section 360bbb-3(b)(1), unless the authorization is terminated or revoked.  Performed at [facility], [place] [address] [place],  [address]   POC occult blood, ED     Status: Abnormal   Collection Time: 11/30/20  2:43 PM  Result Value Ref Range   Fecal Occult Bld POSITIVE (A) NEGATIVE    No results found.  Review of Systems  Constitutional:  Positive for activity change and fatigue. Negative for appetite change, chills, diaphoresis, fever and unexpected weight change.  HENT: Negative.    Eyes: Negative.   Respiratory:  Positive for shortness of breath. Negative for cough, choking, chest tightness, wheezing and stridor.   Cardiovascular:  Positive for palpitations. Negative for chest pain and leg swelling.  Gastrointestinal: Negative.    Blood pressure 101/60, pulse 67, temperature 97.9 F (36.6 C), temperature source Oral, resp. rate 18, SpO2 100 %. Physical Exam Constitutional:      Appearance: Normal appearance.  HENT:     Head: Normocephalic and atraumatic.     Mouth/Throat:     Mouth: Mucous membranes are moist.  Eyes:     Extraocular Movements: Extraocular movements intact.     Pupils: Pupils are equal, round, and reactive to light.  Cardiovascular:     Rate and Rhythm: Normal rate and regular rhythm.     Heart sounds: Normal heart sounds.  Pulmonary:     Effort: Pulmonary effort is normal.     Breath sounds: Normal breath sounds.  Musculoskeletal:     Cervical back: Normal range of motion and neck supple.  Skin:    General: Skin is warm and dry.  Neurological:     General: No focal deficit present.     Mental Status: He is alert and oriented to person, place, and time.  Psychiatric:        Mood and Affect: Mood normal.        Behavior: Behavior normal.        Thought Content: Thought content normal.        Judgment: Judgment normal.   Assessment/Plan: 1) Guaiac positive stool with anemia iron deficiency anemia-we will proceed  with EGD tomorrow to rule out peptic ulcer disease versus AVMs etc.. 2) Sigmoid diverticulosis. 3) History of adenomatous colon polyps. 4) History of atrial fibrillation/atrial flutter/CAD/CHF/nonischemic cardiomyopathy. 5) History of prostate cancer. 6) AODM.  Juanita Craver 11/30/2020, 3:36 PM

## 2020-11-30 NOTE — H&P (Signed)
History and Physical    Jon Williams GNF:[id number] DOB: 1951/01/25 DOA: 11/30/2020  PCP: [name], MD (Confirm with patient/family/NH records and if not entered, this has to be entered at [facility] point of entry) Patient coming from: Home  I have personally briefly reviewed patient's old medical records in [place]  Chief Complaint: Feeling tired  HPI: Jon Williams is a 70 y.o. male with medical history significant of chronic systolic CHF (LVEF 15 to 84% 2018) on AICD, HTN, PAF on Xarelto, CAD, IIDM, prostate cancer, came with worsening of generalized weakness and fatigue.  Patient has had frequent " burning and squeezing like" epigastric cervical pain recently and went to see PCP 1 month ago, who did a breathing test positive for H. pylori.  So far, the patient completed 4 weeks of clarithromycin and Augmentin and on once a day omeprazole, he reported the epigastric burning pain has significantly improved since.  He did not see any blood in the stool or dark-colored stool meantime. Not taking NSAIDS.  Last 1-2 weeks, patient has experienced increasing fatigue, and generalized weakness.  No chest pain no shortness of breath.  Denied any lightheadedness or blurry vision.  He went to see PCP today and blood work showed hemoglobin 7.4 and repeated hemoglobin today 7.1   Review of Systems: As per HPI otherwise 14 point review of systems negative.    Past Medical History:  Diagnosis Date   AICD (automatic cardioverter/defibrillator) present 05/25/2016   biv icd   Bunion, right    Chronic combined systolic and diastolic CHF (congestive heart failure) ([place])    Echo 1/18: Mild conc LVH, EF 15-20, severe diff HK, inf and inf-septal AK, Gr 3 DD, mild to mod MR, severe LAE, mod reduced RVSF, mod RAE, mild TR, PASP 50   Coronary artery disease involving native coronary artery without angina pectoris 04/17/2016   LHC 1/18: pLCx 37, mLCx 20, mRCA 40, dRCA 20, LVEDP 23, mean RA 8, PA  42/20, PCWP 17   Diabetes mellitus without complication (HCC)    DJD (degenerative joint disease)    History of atrial fibrillation    History of atrial flutter    History of cardiomegaly 06/07/2016   Noted on CXR   History of colon polyps 06/28/2017   Noted on colonoscopy   LBBB (left bundle branch block)    NICM (nonischemic cardiomyopathy) ([place])    Echo 1/18:  Mild conc LVH, EF 15-20, severe diff HK, inf and inf-septal AK, Gr 3 DD, mild to mod MR, severe LAE, mod reduced RVSF, mod RAE, mild TR, PASP 50   OA (osteoarthritis)    knee   Other secondary pulmonary hypertension ([place]) 04/17/2016   Prostate cancer ([place]) 2019   Sigmoid diverticulosis 06/28/2017   Noted on colonoscopy    Past Surgical History:  Procedure Laterality Date   BIV ICD INSERTION CRT-D N/A 05/25/2016   Procedure: BiV ICD Insertion CRT-D;  Surgeon: [name], MD;  Location: [facility];  Service: Cardiovascular;  Laterality: N/A;   CARDIAC CATHETERIZATION N/A 03/02/2016   Procedure: Right/Left Heart Cath and Coronary Angiography;  Surgeon: Nelva Bush, MD;  Location: [facility];  Service: Cardiovascular;  Laterality: N/A;   CARDIOVERSION N/A 07/17/2016   Procedure: Cardioversion;  Surgeon: [name], MD;  Location: [facility];  Service: Cardiovascular;  Laterality: N/A;   COLONOSCOPY WITH PROPOFOL N/A 06/28/2017   Procedure: COLONOSCOPY WITH PROPOFOL;  Surgeon: [name], MD;  [facility];  Service: Endoscopy;  Laterality: N/A;   colonscopy  2009   GOLD SEED IMPLANT N/A 12/24/2017   Procedure: GOLD SEED IMPLANT, TRANSERINEAL;  Surgeon: Festus Aloe, MD;  [facility];  Service: Urology;  Laterality: N/A;   INSERT / REPLACE / REMOVE PACEMAKER     LEAD REVISION  10/10/2018   LEAD REVISION/REPAIR N/A 10/10/2018   Procedure: LEAD REVISION/REPAIR;  Surgeon: [name], MD;  Location: [facility];  Service: Cardiovascular;  Laterality: N/A;    POLYPECTOMY  06/28/2017   Procedure: POLYPECTOMY;  Surgeon: [name], MD;  [facility];  Service: Endoscopy;;  ascending and descending colon polyp   PROSTATE BIOPSY  02/20/2017   SPACE OAR INSTILLATION N/A 12/24/2017   Procedure: SPACE OAR INSTILLATION;  Surgeon: Festus Aloe, MD;  [facility];  Service: Urology;  Laterality: N/A;   TOTAL KNEE ARTHROPLASTY Right 08/16/2017   Procedure: RIGHT TOTAL KNEE ARTHROPLASTY;  Surgeon: [name], MD;  Location: [facility];  Service: Orthopedics;  Laterality: Right;     reports that he has never smoked. He has never used smokeless tobacco. He reports that he does not drink alcohol and does not use drugs.  Allergies  Allergen Reactions   Aspirin Anaphylaxis and Hives   Sulfa Antibiotics Anaphylaxis and Hives   Other Other (See Comments)    Family History  Problem Relation Age of Onset   Hypertension Mother    Heart disease Mother    Diabetes Mother    Diabetes Father    Hypertension Father    Cancer Father    Healthy Sister    Heart attack Brother    Heart disease Brother 52       + tobacco   Healthy Brother      Prior to Admission medications   Medication Sig Start Date End Date Taking? Authorizing Provider  atorvastatin (LIPITOR) 40 MG tablet TAKE 1 TABLET(40 MG) BY MOUTH DAILY 11/04/20   [name], MD  carvedilol (COREG) 3.125 MG tablet TAKE 1 TABLET(3.125 MG) BY MOUTH TWICE DAILY 11/04/20   [name], MD  cholecalciferol (VITAMIN D3) 25 MCG (1000 UT) tablet Take 1,000 Units by mouth daily with lunch.     [provider]  furosemide (LASIX) 40 MG tablet Take 1 tablet (40 mg total) by mouth 2 (two) times daily. 03/07/20   [name], [name], MD  JARDIANCE 10 MG TABS tablet Take 1 tablet by mouth daily. 12/30/19   [provider]  metFORMIN (GLUCOPHAGE-XR) 500 MG 24 hr tablet  06/13/20   [provider]  Multiple Vitamin (MULTIVITAMIN WITH MINERALS) TABS tablet Take 1 tablet by  mouth daily with lunch. One-A-Day    [provider]  Naphazoline HCl (CLEAR EYES OP) Place 1 drop into both eyes daily as needed (irritation).    [provider]  potassium chloride (KLOR-CON) 10 MEQ tablet TAKE 1 TABLET(10 MEQ) BY MOUTH TWICE DAILY 02/22/20   [name], MD  rivaroxaban (XARELTO) 20 MG TABS tablet Take 1 tablet (20 mg total) by mouth daily with supper. 10/19/20   [name], MD  sacubitril-valsartan (ENTRESTO) 24-26 MG Take 1 tablet by mouth 2 (two) times daily. 05/11/20   [name], MD  sotalol (BETAPACE) 80 MG tablet TAKE 1 AND 1/2 TABLETS(120 MG) BY MOUTH TWICE DAILY 11/04/20   [name], MD    Physical Exam: Vitals:   11/30/20 1307 11/30/20 1400 11/30/20 1500 11/30/20 1524  BP:  113/63 98/67 104/61 101/60  Pulse: 89 65 67 65  Resp: 18 (!) 21 (!) 22 18  Temp: 97.8 F (36.6 C)   97.9 F (36.6 C)  TempSrc: Oral   Oral  SpO2: 100% 100% 100% 100%    Constitutional: NAD, calm, comfortable Vitals:   11/30/20 1307 11/30/20 1400 11/30/20 1500 11/30/20 1524  BP: 113/63 98/67 104/61 101/60  Pulse: 89 65 67 65  Resp: 18 (!) 21 (!) 22 18  Temp: 97.8 F (36.6 C)   97.9 F (36.6 C)  TempSrc: Oral   Oral  SpO2: 100% 100% 100% 100%   Eyes: PERRL, lids and conjunctivae normal ENMT: Mucous membranes are moist. Posterior pharynx clear of any exudate or lesions.Normal dentition.  Neck: normal, supple, no masses, no thyromegaly Respiratory: clear to auscultation bilaterally, no wheezing, no crackles. Normal respiratory effort. No accessory muscle use.  Cardiovascular: Regular rate and rhythm, no murmurs / rubs / gallops. No extremity edema. 2+ pedal pulses. No carotid bruits.  Abdomen: no tenderness, no masses palpated. No hepatosplenomegaly. Bowel sounds positive.  Musculoskeletal: no clubbing / cyanosis. No joint deformity upper and lower extremities. Good ROM, no contractures. Normal muscle tone.  Skin: no rashes, lesions, ulcers. No  induration Neurologic: CN 2-12 grossly intact. Sensation intact, DTR normal. Strength 5/5 in all 4.  Psychiatric: Normal judgment and insight. Alert and oriented x 3. Normal mood.     Labs on Admission: I have personally reviewed following labs and imaging studies  CBC: Recent Labs  Lab 11/30/20 1334  WBC 5.8  NEUTROABS 4.1  HGB 7.1*  HCT 24.3*  MCV 78.6*  PLT 203   Basic Metabolic Panel: Recent Labs  Lab 11/30/20 1334  NA 139  K 3.3*  CL 107  CO2 25  GLUCOSE 145*  BUN 19  CREATININE 1.15  CALCIUM 9.1   GFR: CrCl cannot be calculated (Unknown ideal weight.). Liver Function Tests: Recent Labs  Lab 11/30/20 1334  AST 19  ALT 17  ALKPHOS 52  BILITOT 0.6  PROT 6.9  ALBUMIN 3.8   No results for input(s): LIPASE, AMYLASE in the last 168 hours. No results for input(s): AMMONIA in the last 168 hours. Coagulation Profile: No results for input(s): INR, PROTIME in the last 168 hours. Cardiac Enzymes: No results for input(s): CKTOTAL, CKMB, CKMBINDEX, TROPONINI in the last 168 hours. BNP (last 3 results) No results for input(s): PROBNP in the last 8760 hours. HbA1C: No results for input(s): HGBA1C in the last 72 hours. CBG: No results for input(s): GLUCAP in the last 168 hours. Lipid Profile: No results for input(s): CHOL, HDL, LDLCALC, TRIG, CHOLHDL, LDLDIRECT in the last 72 hours. Thyroid Function Tests: No results for input(s): TSH, T4TOTAL, FREET4, T3FREE, THYROIDAB in the last 72 hours. Anemia Panel: Recent Labs    11/30/20 1408  RETICCTPCT 1.1   Urine analysis:    Component Value Date/Time   COLORURINE YELLOW 08/05/2017 1344   APPEARANCEUR HAZY (A) 08/05/2017 1344   LABSPEC 1.020 08/05/2017 1344   PHURINE 5.0 08/05/2017 1344   GLUCOSEU NEGATIVE 08/05/2017 1344   HGBUR NEGATIVE 08/05/2017 1344   BILIRUBINUR NEGATIVE 08/05/2017 1344   KETONESUR NEGATIVE 08/05/2017 1344   PROTEINUR NEGATIVE 08/05/2017 1344   NITRITE NEGATIVE 08/05/2017 1344    LEUKOCYTESUR NEGATIVE 08/05/2017 1344    Radiological Exams on Admission: No results found.  EKG: Independently reviewed.  A. fib PVCs  Assessment/Plan Active Problems:   Anemia  (please populate well all problems here in Problem List. (For  example, if patient is on BP meds at home and you resume or decide to hold them, it is a problem that needs to be her. Same for CAD, COPD, HLD and so on)   Symptomatic anemia, acute-secondary to likely blood loss with GI source suspected- -PRBC x1, recheck H&H tonight and tomorrow morning. -Suspected underlying peptic ulcer, probably source of GI bleed/blood loss.  Continue double dose PPI -GI Dr. Benson Norway consulted in the ED.  Hypokalemia -PO replacement -Mg level pending.  Chronic systolic CHF, with borderline hypotension -Euvolemic, no complaints about feeling of light headedness -Continue Entresto and p.o. Lasix twice daily.  PAF -Rate controlled. Continue Sotalol and Coreg. -Hold Xarelto   IIDM -Insulin sliding scale for now.  DVT prophylaxis: SCD Code Status: Full code Family Communication: Wife at bedside Disposition Plan: Expect more than 2 midnight hospital stay for GI work-up and transfusion. Consults called: Dr. Benson Norway Admission status: Tele admit   Lequita Halt MD [facility] Pager [phone]  11/30/2020, 3:25 PM

## 2020-11-30 NOTE — Consult Note (Signed)
Reason for Consult: Iron deficiency anemia with guaiac t positive stools. Referring Physician: ER MD  Jon Williams is an 70 y.o. male.  HPI: [name] is a 70 year old black male with multiple medical problems listed below presented to the emergency room today after he was sent to the ER by his PCP after he was noted to have severe anemia with a hemoglobin of 7.4 g/dL on a routine physical.  Patient claims she was having some abdominal pain and acid reflux with nausea about 4 weeks ago when he visited Dr. Wilhemina Bonito and was found to be H. pylori positive in a practice and was treated with Amoxicillin, Omeprazole and Clarithromycin. He claims his abdominal pain and reflux improved after treatment with above-mentioned medications. He denies having any melena hematochezia. Patient admits to having some shortness of breath with exertion and some palpitations in the last 3 to 4 months he is on Xarelto for history of atrial fibrillation and he took his last dose this morning. He has 2 BM's per day but however that over the last few weeks has been having 3-4 loose BM's per day with no blood or mucus in the stool.  In the emergency room on admission the patient was found to have a hemoglobin of 7.1 g/dL with a ferritin of 6 and iron of 13. His last colonoscopy was done on 06/28/2017 by Dr. Saralyn Pilar and when a couple of adenomatous polyps were removed and sigmoid diverticulosis was noted. Patient denies having any nausea, vomiting, dysphagia or odynophagia at this time. He is receiving the first unit of packed red blood cells at the present time.  Past Medical History:  Diagnosis Date   AICD (automatic cardioverter/defibrillator) present 05/25/2016   biv icd   Bunion, right    Chronic combined systolic and diastolic CHF (congestive heart failure) ([facility])    Echo 1/18: Mild conc LVH, EF 15-20, severe diff HK, inf and inf-septal AK, Gr 3 DD, mild to mod MR, severe LAE, mod reduced RVSF, mod RAE, mild  TR, PASP 50   Coronary artery disease involving native coronary artery without angina pectoris 04/17/2016   LHC 1/18: pLCx 61, mLCx 20, mRCA 40, dRCA 20, LVEDP 23, mean RA 8, PA 42/20, PCWP 17   Diabetes mellitus without complication (HCC)    DJD (degenerative joint disease)    History of atrial fibrillation    History of atrial flutter    History of cardiomegaly 06/07/2016   Noted on CXR   History of colon polyps 06/28/2017   Noted on colonoscopy   LBBB (left bundle branch block)    NICM (nonischemic cardiomyopathy) ([place])    Echo 1/18:  Mild conc LVH, EF 15-20, severe diff HK, inf and inf-septal AK, Gr 3 DD, mild to mod MR, severe LAE, mod reduced RVSF, mod RAE, mild TR, PASP 50   OA (osteoarthritis)    knee   Other secondary pulmonary hypertension ([place]) 04/17/2016   Prostate cancer ([facility]) 2019   Sigmoid diverticulosis 06/28/2017   Noted on colonoscopy   Past Surgical History:  Procedure Laterality Date   BIV ICD INSERTION CRT-D N/A 05/25/2016   Procedure: BiV ICD Insertion CRT-D;  Surgeon: [name], MD;  Location: [facility];  Service: Cardiovascular;  Laterality: N/A;   CARDIAC CATHETERIZATION N/A 03/02/2016   Procedure: Right/Left Heart Cath and Coronary Angiography;  Surgeon: Nelva Bush, MD;  Location: [facility];  Service: Cardiovascular;  Laterality: N/A;   CARDIOVERSION N/A 07/17/2016  Procedure: Cardioversion;  Surgeon: [name], MD;  Location: [facility];  Service: Cardiovascular;  Laterality: N/A;   COLONOSCOPY WITH PROPOFOL N/A 06/28/2017   Procedure: COLONOSCOPY WITH PROPOFOL;  Surgeon: [name], MD;  Location: [facility];  Service: Endoscopy;  Laterality: N/A;   colonscopy  2009   GOLD SEED IMPLANT N/A 12/24/2017   Procedure: GOLD SEED IMPLANT, TRANSERINEAL;  Surgeon: Festus Aloe, MD;  Location: [facility];  Service: Urology;  Laterality: N/A;   INSERT / REPLACE / REMOVE PACEMAKER     LEAD REVISION  10/10/2018   LEAD  REVISION/REPAIR N/A 10/10/2018   Procedure: LEAD REVISION/REPAIR;  Surgeon: [name], MD;  Location: [facility];  Service: Cardiovascular;  Laterality: N/A;   POLYPECTOMY  06/28/2017   Procedure: POLYPECTOMY;  Surgeon: [name], MD;  [facility];  Service: Endoscopy;;  ascending and descending colon polyp   PROSTATE BIOPSY  02/20/2017   SPACE OAR INSTILLATION N/A 12/24/2017   Procedure: SPACE OAR INSTILLATION;  Surgeon: Festus Aloe, MD;  Location: [facility];  Service: Urology;  Laterality: N/A;   TOTAL KNEE ARTHROPLASTY Right 08/16/2017   Procedure: RIGHT TOTAL KNEE ARTHROPLASTY;  Surgeon: [name], MD;  Location: [facility];  Service: Orthopedics;  Laterality: Right;   Family History  Problem Relation Age of Onset   Hypertension Mother    Heart disease Mother    Diabetes Mother    Diabetes Father    Hypertension Father    Cancer Father    Healthy Sister    Heart attack Brother    Heart disease Brother 20       + tobacco   Healthy Brother    Social History:  reports that he has never smoked. He has never used smokeless tobacco. He reports that he does not drink alcohol and does not use drugs.  Allergies:  Allergies  Allergen Reactions   Aspirin Anaphylaxis and Hives   Sulfa Antibiotics Anaphylaxis and Hives   Other Other (See Comments)    Medications: I have reviewed the patient's current medications.  Results for orders placed or performed during the hospital encounter of 11/30/20 (from the past 48 hour(s))  Type and screen [facility]     Status: None (Preliminary result)   Collection Time: 11/30/20  1:20 PM  Result Value Ref Range   ABO/RH(D) O POS    Antibody Screen NEG    Sample Expiration 12/03/2020,2359    Unit Number [id number]    Blood Component Type RED CELLS,LR    Unit division 00    Status of Unit ISSUED    Transfusion Status OK TO TRANSFUSE    Crossmatch Result      Compatible Performed at [facility], [place] [address], [place], [place] [address]   CBC with Differential     Status: Abnormal   Collection Time: 11/30/20  1:34 PM  Result Value Ref Range   WBC 5.8 4.0 - 10.5 K/uL   RBC 3.09 (L) 4.22 - 5.81 MIL/uL   Hemoglobin 7.1 (L) 13.0 - 17.0 g/dL   HCT 24.3 (L) 39.0 - 52.0 %   MCV 78.6 (L) 80.0 - 100.0 fL   MCH 23.0 (L) 26.0 - 34.0 pg   MCHC 29.2 (L) 30.0 - 36.0 g/dL   RDW 18.0 (H) 11.5 - 15.5 %   Platelets 288 150 - 400 K/uL   nRBC 0.0 0.0 - 0.2 %   Neutrophils Relative % 72 %  Neutro Abs 4.1 1.7 - 7.7 K/uL   Lymphocytes Relative 17 %   Lymphs Abs 1.0 0.7 - 4.0 K/uL   Monocytes Relative 9 %   Monocytes Absolute 0.5 0.1 - 1.0 K/uL   Eosinophils Relative 2 %   Eosinophils Absolute 0.1 0.0 - 0.5 K/uL   Basophils Relative 0 %   Basophils Absolute 0.0 0.0 - 0.1 K/uL   Immature Granulocytes 0 %   Abs Immature Granulocytes 0.01 0.00 - 0.07 K/uL    Comment: Performed at [facility] [address] [place], [place] [address]  Comprehensive metabolic panel     Status: Abnormal   Collection Time: 11/30/20  1:34 PM  Result Value Ref Range   Sodium 139 135 - 145 mmol/L   Potassium 3.3 (L) 3.5 - 5.1 mmol/L   Chloride 107 98 - 111 mmol/L   CO2 25 22 - 32 mmol/L   Glucose, Bld 145 (H) 70 - 99 mg/dL    Comment: Glucose reference range applies only to samples taken after fasting for at least 8 hours.   BUN 19 8 - 23 mg/dL   Creatinine, Ser 1.15 0.61 - 1.24 mg/dL   Calcium 9.1 8.9 - 10.3 mg/dL   Total Protein 6.9 6.5 - 8.1 g/dL   Albumin 3.8 3.5 - 5.0 g/dL   AST 19 15 - 41 U/L   ALT 17 0 - 44 U/L   Alkaline Phosphatase 52 38 - 126 U/L   Total Bilirubin 0.6 0.3 - 1.2 mg/dL   GFR, Estimated >60 >60 mL/min    Comment: (NOTE) Calculated using the CKD-EPI Creatinine Equation (2021)    Anion gap 7 5 - 15    Comment: Performed at [facility] [address] [place], [place] [address]  Reticulocytes     Status: Abnormal   Collection Time: 11/30/20  2:08 PM   Result Value Ref Range   Retic Ct Pct 1.1 0.4 - 3.1 %   RBC. 2.96 (L) 4.22 - 5.81 MIL/uL   Retic Count, Absolute 32.0 19.0 - 186.0 K/uL   Immature Retic Fract 36.6 (H) 2.3 - 15.9 %    Comment: Performed at [facility] [address] [place], [place] [address]  Prepare RBC (crossmatch)     Status: None   Collection Time: 11/30/20  2:15 PM  Result Value Ref Range   Order Confirmation      ORDER PROCESSED BY BLOOD BANK Performed at [facility], [place] [address], [place], [place] [address]   Resp Panel by RT-PCR (Flu A&B, Covid) Nasopharyngeal Swab     Status: None   Collection Time: 11/30/20  2:17 PM   Specimen: Nasopharyngeal Swab; Nasopharyngeal(NP) swabs in vial transport medium  Result Value Ref Range   SARS Coronavirus 2 by RT PCR NEGATIVE NEGATIVE    Comment: (NOTE) SARS-CoV-2 target nucleic acids are NOT DETECTED.  The SARS-CoV-2 RNA is generally detectable in upper respiratory specimens during the acute phase of infection. The lowest concentration of SARS-CoV-2 viral copies this assay can detect is 138 copies/mL. A negative result does not preclude SARS-Cov-2 infection and should not be used as the sole basis for treatment or other patient management decisions. A negative result may occur with  improper specimen collection/handling, submission of specimen other than nasopharyngeal swab, presence of viral mutation(s) within the areas targeted by this assay, and inadequate number of viral copies(<138 copies/mL). A negative result must be combined with clinical observations, patient history, and  epidemiological information. The expected result is Negative.  Fact Sheet for Patients:  EntrepreneurPulse.com.au  Fact Sheet for Healthcare Providers:  IncredibleEmployment.be  This test is no t yet approved or cleared by the [place] FDA and  has been authorized for detection and/or diagnosis of SARS-CoV-2 by FDA under an  Emergency Use Authorization (EUA). This EUA will remain  in effect (meaning this test can be used) for the duration of the COVID-19 declaration under Section 564(b)(1) of the Act, 21 U.S.C.section 360bbb-3(b)(1), unless the authorization is terminated  or revoked sooner.       Influenza A by PCR NEGATIVE NEGATIVE   Influenza B by PCR NEGATIVE NEGATIVE    Comment: (NOTE) The Xpert Xpress SARS-CoV-2/FLU/RSV plus assay is intended as an aid in the diagnosis of influenza from Nasopharyngeal swab specimens and should not be used as a sole basis for treatment. Nasal washings and aspirates are unacceptable for Xpert Xpress SARS-CoV-2/FLU/RSV testing.  Fact Sheet for Patients: EntrepreneurPulse.com.au  Fact Sheet for Healthcare Providers: IncredibleEmployment.be  This test is not yet approved or cleared by the [place] FDA and has been authorized for detection and/or diagnosis of SARS-CoV-2 by FDA under an Emergency Use Authorization (EUA). This EUA will remain in effect (meaning this test can be used) for the duration of the COVID-19 declaration under Section 564(b)(1) of the Act, 21 U.S.C. section 360bbb-3(b)(1), unless the authorization is terminated or revoked.  Performed at [facility], [place] [address], [place], [place] [address]   POC occult blood, ED     Status: Abnormal   Collection Time: 11/30/20  2:43 PM  Result Value Ref Range   Fecal Occult Bld POSITIVE (A) NEGATIVE    No results found.  Review of Systems  Constitutional:  Positive for activity change and fatigue. Negative for appetite change, chills, diaphoresis, fever and unexpected weight change.  HENT: Negative.    Eyes: Negative.   Respiratory:  Positive for shortness of breath. Negative for cough, choking, chest tightness, wheezing and stridor.   Cardiovascular:  Positive for palpitations. Negative for chest pain and leg swelling.  Gastrointestinal: Negative.    Blood pressure 101/60, pulse 67, temperature 97.9 F (36.6 C), temperature source Oral, resp. rate 18, SpO2 100 %. Physical Exam Constitutional:      Appearance: Normal appearance.  HENT:     Head: Normocephalic and atraumatic.     Mouth/Throat:     Mouth: Mucous membranes are moist.  Eyes:     Extraocular Movements: Extraocular movements intact.     Pupils: Pupils are equal, round, and reactive to light.  Cardiovascular:     Rate and Rhythm: Normal rate and regular rhythm.     Heart sounds: Normal heart sounds.  Pulmonary:     Effort: Pulmonary effort is normal.     Breath sounds: Normal breath sounds.  Musculoskeletal:     Cervical back: Normal range of motion and neck supple.  Skin:    General: Skin is warm and dry.  Neurological:     General: No focal deficit present.     Mental Status: He is alert and oriented to person, place, and time.  Psychiatric:        Mood and Affect: Mood normal.        Behavior: Behavior normal.        Thought Content: Thought content normal.        Judgment: Judgment normal.   Assessment/Plan: 1) Guaiac positive stool with anemia iron deficiency anemia-we will proceed  with EGD tomorrow to rule out peptic ulcer disease versus AVMs etc.. 2) Sigmoid diverticulosis. 3) History of adenomatous colon polyps. 4) History of atrial fibrillation/atrial flutter/CAD/CHF/nonischemic cardiomyopathy. 5) History of prostate cancer. 6) AODM.  Juanita Craver 11/30/2020, 3:36 PM

## 2020-11-30 NOTE — ED Provider Notes (Signed)
Emergency Medicine Provider Triage Evaluation Note  Jon Williams , a 70 y.o. male  was evaluated in triage.  Pt complains of low hemoglobin.  Reports blood work performed by PCP earlier this morning was found to have hemoglobin of 7.4.  Patient is on Xarelto.  Patient endorses shortness of breath intermittently over the last 4 to 5 months with exertion.  Denies any shortness of breath at this time.  Review of Systems  Positive: Shortness of breath with exertion Negative: Chest pain, blood in stool, melena, hematuria, hematemesis, coffee-ground emesis, abdominal pain  Physical Exam  BP 113/63 (BP Location: Left Arm)   Pulse 89   Temp 97.8 F (36.6 C) (Oral)   Resp 18   SpO2 100%  Gen:   Awake, no distress   Resp:  Normal effort  MSK:   Moves extremities without difficulty; trace edema to bilateral lower extremities Other:    Medical Decision Making  Medically screening exam initiated at 1:18 PM.  Appropriate orders placed.  [name] was informed that the remainder of the evaluation will be completed by another provider, this initial triage assessment does not replace that evaluation, and the importance of remaining in the ED until their evaluation is complete.  Reports of low hemoglobin; will obtain CBC, CMP, and type/screen.   Loni Beckwith, PA-C 11/30/20 1320    Blanchie Dessert, MD 12/07/20 1544

## 2020-11-30 NOTE — ED Triage Notes (Signed)
Patient sent to ED by PCP for evaluation of anemia after hemoglobin was 7.4 on blood draw this morning, patient reports taking xarelto. Patient denies feeling weak but does report shortness of breath with exertion. Patient alert, oriented, and in no apparent distress at this time.

## 2020-11-30 NOTE — ED Notes (Signed)
Pt reports he feels "much better."  Pt has eaten dinner.

## 2020-12-01 ENCOUNTER — Inpatient Hospital Stay (HOSPITAL_COMMUNITY): Payer: Medicare HMO

## 2020-12-01 ENCOUNTER — Encounter (HOSPITAL_COMMUNITY): Payer: Self-pay | Admitting: Internal Medicine

## 2020-12-01 ENCOUNTER — Inpatient Hospital Stay (HOSPITAL_COMMUNITY): Payer: Medicare HMO | Admitting: Certified Registered"

## 2020-12-01 ENCOUNTER — Encounter (HOSPITAL_COMMUNITY)
Admission: EM | Disposition: A | Discharge status: Home / Self Care | Payer: Self-pay | Source: Ambulatory Visit | Attending: Internal Medicine

## 2020-12-01 ENCOUNTER — Other Ambulatory Visit: Payer: Self-pay

## 2020-12-01 DIAGNOSIS — D649 Anemia, unspecified: Secondary | ICD-10-CM | POA: Diagnosis not present

## 2020-12-01 DIAGNOSIS — I5022 Chronic systolic (congestive) heart failure: Secondary | ICD-10-CM | POA: Diagnosis not present

## 2020-12-01 DIAGNOSIS — K922 Gastrointestinal hemorrhage, unspecified: Secondary | ICD-10-CM | POA: Diagnosis not present

## 2020-12-01 HISTORY — PX: BIOPSY: SHX5522

## 2020-12-01 HISTORY — PX: ESOPHAGOGASTRODUODENOSCOPY: SHX5428

## 2020-12-01 LAB — BASIC METABOLIC PANEL
Anion gap: 7 (ref 5–15)
BUN: 18 mg/dL (ref 8–23)
CO2: 22 mmol/L (ref 22–32)
Calcium: 8.7 mg/dL — ABNORMAL LOW (ref 8.9–10.3)
Chloride: 107 mmol/L (ref 98–111)
Creatinine, Ser: 1.05 mg/dL (ref 0.61–1.24)
GFR, Estimated: >60 mL/min (ref >60)
Glucose, Bld: 96 mg/dL (ref 70–99)
Potassium: 3.8 mmol/L (ref 3.5–5.1)
Sodium: 136 mmol/L (ref 135–145)

## 2020-12-01 LAB — CBC
HCT: 24.2 % — ABNORMAL LOW (ref 39.0–52.0)
Hemoglobin: 7.2 g/dL — ABNORMAL LOW (ref 13.0–17.0)
MCH: 23.8 pg — ABNORMAL LOW (ref 26.0–34.0)
MCHC: 29.8 g/dL — ABNORMAL LOW (ref 30.0–36.0)
MCV: 79.9 fL — ABNORMAL LOW (ref 80.0–100.0)
Platelets: 253 10*3/uL (ref 150–400)
RBC: 3.03 MIL/uL — ABNORMAL LOW (ref 4.22–5.81)
RDW: 18 % — ABNORMAL HIGH (ref 11.5–15.5)
WBC: 6.4 10*3/uL (ref 4.0–10.5)
nRBC: 0.3 % — ABNORMAL HIGH (ref 0.0–0.2)

## 2020-12-01 LAB — GLUCOSE, CAPILLARY
Glucose-Capillary: 127 mg/dL — ABNORMAL HIGH (ref 70–99)
Glucose-Capillary: 170 mg/dL — ABNORMAL HIGH (ref 70–99)

## 2020-12-01 LAB — PREPARE RBC (CROSSMATCH)

## 2020-12-01 LAB — CBG MONITORING, ED
Glucose-Capillary: 95 mg/dL (ref 70–99)
Glucose-Capillary: 91 mg/dL (ref 70–99)

## 2020-12-01 LAB — HEMOGLOBIN A1C
Hgb A1c MFr Bld: 6.2 % — ABNORMAL HIGH (ref 4.8–5.6)
Mean Plasma Glucose: 131.24 mg/dL

## 2020-12-01 LAB — MAGNESIUM: Magnesium: 2 mg/dL (ref 1.7–2.4)

## 2020-12-01 SURGERY — EGD (ESOPHAGOGASTRODUODENOSCOPY)
Anesthesia: Monitor Anesthesia Care

## 2020-12-01 MED ORDER — LIDOCAINE 2% (20 MG/ML) 5 ML SYRINGE
INTRAMUSCULAR | Status: DC | PRN
Start: 2020-12-01 — End: 2020-12-01
  Administered 2020-12-01: 100 mg via INTRAVENOUS
  Administered 2020-12-01: 50 mg via INTRAVENOUS

## 2020-12-01 MED ORDER — GLYCOPYRROLATE PF 0.2 MG/ML IJ SOSY
PREFILLED_SYRINGE | INTRAMUSCULAR | Status: DC | PRN
  Administered 2020-12-01: .1 mg via INTRAVENOUS

## 2020-12-01 MED ORDER — SODIUM CHLORIDE 0.9% IV SOLUTION: Freq: Once | INTRAVENOUS | Status: DC

## 2020-12-01 MED ORDER — PROPOFOL 500 MG/50ML IV EMUL
INTRAVENOUS | Status: DC | PRN
  Administered 2020-12-01: 40 ug/kg/min via INTRAVENOUS

## 2020-12-01 MED ORDER — LIDOCAINE HCL (CARDIAC) PF 100 MG/5ML IV SOSY: PREFILLED_SYRINGE | INTRAVENOUS | Status: DC | PRN

## 2020-12-01 MED ORDER — KETAMINE HCL 50 MG/5ML IJ SOSY
PREFILLED_SYRINGE | INTRAMUSCULAR | Status: AC
  Filled 2020-12-01: qty 5

## 2020-12-01 MED ORDER — LACTATED RINGERS IV SOLN: INTRAVENOUS | Status: DC | PRN

## 2020-12-01 MED ORDER — IOHEXOL 9 MG/ML PO SOLN
ORAL | Status: AC
  Filled 2020-12-01: qty 1000

## 2020-12-01 MED ORDER — IOHEXOL 350 MG/ML SOLN
75.0000 mL | Freq: Once | INTRAVENOUS | Status: AC | PRN
  Administered 2020-12-01: 75 mL via INTRAVENOUS

## 2020-12-01 MED ORDER — PROPOFOL 10 MG/ML IV BOLUS
INTRAVENOUS | Status: DC | PRN
  Administered 2020-12-01: 30 mg via INTRAVENOUS

## 2020-12-01 MED ORDER — KETAMINE HCL 10 MG/ML IJ SOLN
INTRAMUSCULAR | Status: DC | PRN
  Administered 2020-12-01: 20 mg via INTRAVENOUS

## 2020-12-01 NOTE — Progress Notes (Signed)
Patient arrived via bed at approximately 1545. Patient is A&O x4 and able to make his needs known. Wife Jon Williams is with patient at bedside. Patient denies pain or discomfort at this time. Skin is CDI. Lung sounds clear bilateral, ABD soft non distended. Bilateral lower extremities warm to the touch, cap refill less than 3 seconds. Patient states he walks around his home throughout assistance of any devices. States he is independent with ADL's, drives his car daily. Understands the use of the call light. Educated patient on using the call light before getting OOB. Safety devices in place.

## 2020-12-01 NOTE — Progress Notes (Signed)
Pt given instruction procedure for CT scan and on drinking contrast dye. Pt started drinking dye at 2100. CT tech notified.

## 2020-12-01 NOTE — Transfer of Care (Signed)
Immediate Anesthesia Transfer of Care Note  Patient: Jon Williams  Procedure(s) Performed: ESOPHAGOGASTRODUODENOSCOPY (EGD) BIOPSY  Patient Location: PACU  Anesthesia Type:MAC  Level of Consciousness: awake, alert  and patient cooperative  Airway & Oxygen Therapy: Patient Spontanous Breathing  Post-op Assessment: Report given to RN and Post -op Vital signs reviewed and stable  Post vital signs: Reviewed and stable  Last Vitals:  Vitals Value Taken Time  BP 118/61 12/01/20 1454  Temp 36.4 C 12/01/20 1453  Pulse 69 12/01/20 1458  Resp 19 12/01/20 1458  SpO2 97 % 12/01/20 1458  Vitals shown include unvalidated device data.  Last Pain:  Vitals:   12/01/20 1453  TempSrc: Temporal  PainSc: 0-No pain         Complications: No notable events documented.

## 2020-12-01 NOTE — Progress Notes (Signed)
Patient ID: Jon Williams, male   DOB: February 16, 1950, 70 y.o.   MRN: [id number]  PROGRESS NOTE    Jon Williams  NID:[id number] DOB: 1950-02-17 DOA: 11/30/2020 PCP: [name], MD   Brief Narrative:   70 y.o. male with medical history significant of chronic systolic CHF (LVEF 15 to 14% 2018) on AICD, HTN, PAF on Xarelto, CAD, IIDM, prostate cancer, recent treatment of possible H. pylori gastritis presented with generalized weakness and fatigue.  His hemoglobin was 7.4 at his PCPs office and repeat hemoglobin in the ED was 7.1.  He was started on Protonix.  GI was consulted.  Assessment & Plan:   Symptomatic anemia Possible GI bleeding -Presented with hemoglobin of 7.4 at PCPs office and repeat hemoglobin in the ED was 7.1. -Status post 1 unit packed red cell transfusion.  Hemoglobin 7.2 today.  We will transfuse 1 more unit of PRBC.  No black or bloody stools reported -Continue PPI -GI following and planning for EGD today. -Repeat a.m. hemoglobin -Might need a dose of IV iron as well prior to discharge  Chronic systolic heart failure with history of AICD -Currently compensated.  Continue Coreg, Lasix and Entresto.  Outpatient follow-up with cardiology -Strict input and output.  Daily weights.  Fluid restriction  Paroxysmal A. fib -Rate controlled.  Continue sotalol and Coreg.  Xarelto on hold.  Hyperlipidemia -Continue statin  Diabetes mellitus type 2 -A1c 6.2.  Continue CBGs with SSI   DVT prophylaxis: SCDs Code Status: Full Family Communication: Wife at bedside Disposition Plan: Status is: Inpatient  Remains inpatient appropriate because: Of pending EGD for today and hemoglobin still low  Consultants: GI  Procedures: None  Antimicrobials: None   Subjective: Patient seen and examined at bedside.  Denies any black or bloody stools overnight.  Denies current nausea, vomiting or abdominal pain.  Denies worsening shortness of breath.  Objective: Vitals:    12/01/20 0430 12/01/20 0500 12/01/20 0600 12/01/20 0700  BP:  103/66 106/70 107/72  Pulse: 68 65 68 65  Resp:  20 13 20   Temp:      TempSrc:      SpO2: 100% 100% 100% 100%  Weight:      Height:        Intake/Output Summary (Last 24 hours) at 12/01/2020 1033 Last data filed at 11/30/2020 1803 Gross per 24 hour  Intake 315 ml  Output --  Net 315 ml   Filed Weights   11/30/20 1628  Weight: 93.9 kg    Examination:  General exam: Appears calm and comfortable.  Currently on room air Respiratory system: Bilateral decreased breath sounds at bases Cardiovascular system: S1 & S2 heard, Rate controlled Gastrointestinal system: Abdomen is nondistended, soft and nontender. Normal bowel sounds heard. Extremities: No cyanosis, clubbing; trace lower extremity edema Central nervous system: Alert and oriented. No focal neurological deficits. Moving extremities Skin: No rashes, lesions or ulcers Psychiatry: Judgement and insight appear normal. Mood & affect appropriate.     Data Reviewed: I have personally reviewed following labs and imaging studies  CBC: Recent Labs  Lab 11/30/20 1334 12/01/20 0458  WBC 5.8 6.4  NEUTROABS 4.1  --   HGB 7.1* 7.2*  HCT 24.3* 24.2*  MCV 78.6* 79.9*  PLT 288 431   Basic Metabolic Panel: Recent Labs  Lab 11/30/20 1334 12/01/20 0458  NA 139 136  K 3.3* 3.8  CL 107 107  CO2 25 22  GLUCOSE 145* 96  BUN 19 18  CREATININE 1.15 1.05  CALCIUM 9.1 8.7*  MG  --  2.0   GFR: Estimated Creatinine Clearance: 68.2 mL/min (by C-G formula based on SCr of 1.05 mg/dL). Liver Function Tests: Recent Labs  Lab 11/30/20 1334  AST 19  ALT 17  ALKPHOS 52  BILITOT 0.6  PROT 6.9  ALBUMIN 3.8   No results for input(s): LIPASE, AMYLASE in the last 168 hours. No results for input(s): AMMONIA in the last 168 hours. Coagulation Profile: No results for input(s): INR, PROTIME in the last 168 hours. Cardiac Enzymes: No results for input(s): CKTOTAL, CKMB,  CKMBINDEX, TROPONINI in the last 168 hours. BNP (last 3 results) No results for input(s): PROBNP in the last 8760 hours. HbA1C: Recent Labs    12/01/20 0458  HGBA1C 6.2*   CBG: Recent Labs  Lab 11/30/20 2011 12/01/20 0815  GLUCAP 115* 95   Lipid Profile: No results for input(s): CHOL, HDL, LDLCALC, TRIG, CHOLHDL, LDLDIRECT in the last 72 hours. Thyroid Function Tests: No results for input(s): TSH, T4TOTAL, FREET4, T3FREE, THYROIDAB in the last 72 hours. Anemia Panel: Recent Labs    11/30/20 1408  VITAMINB12 438  FOLATE 28.4  FERRITIN 6*  TIBC 420  IRON 13*  RETICCTPCT 1.1   Sepsis Labs: No results for input(s): PROCALCITON, LATICACIDVEN in the last 168 hours.  Recent Results (from the past 240 hour(s))  Resp Panel by RT-PCR (Flu A&B, Covid) Nasopharyngeal Swab     Status: None   Collection Time: 11/30/20  2:17 PM   Specimen: Nasopharyngeal Swab; Nasopharyngeal(NP) swabs in vial transport medium  Result Value Ref Range Status   SARS Coronavirus 2 by RT PCR NEGATIVE NEGATIVE Final    Comment: (NOTE) SARS-CoV-2 target nucleic acids are NOT DETECTED.  The SARS-CoV-2 RNA is generally detectable in upper respiratory specimens during the acute phase of infection. The lowest concentration of SARS-CoV-2 viral copies this assay can detect is 138 copies/mL. A negative result does not preclude SARS-Cov-2 infection and should not be used as the sole basis for treatment or other patient management decisions. A negative result may occur with  improper specimen collection/handling, submission of specimen other than nasopharyngeal swab, presence of viral mutation(s) within the areas targeted by this assay, and inadequate number of viral copies(<138 copies/mL). A negative result must be combined with clinical observations, patient history, and epidemiological information. The expected result is Negative.  Fact Sheet for Patients:   EntrepreneurPulse.com.au  Fact Sheet for Healthcare Providers:  IncredibleEmployment.be  This test is no t yet approved or cleared by the [place] FDA and  has been authorized for detection and/or diagnosis of SARS-CoV-2 by FDA under an Emergency Use Authorization (EUA). This EUA will remain  in effect (meaning this test can be used) for the duration of the COVID-19 declaration under Section 564(b)(1) of the Act, 21 U.S.C.section 360bbb-3(b)(1), unless the authorization is terminated  or revoked sooner.       Influenza A by PCR NEGATIVE NEGATIVE Final   Influenza B by PCR NEGATIVE NEGATIVE Final    Comment: (NOTE) The Xpert Xpress SARS-CoV-2/FLU/RSV plus assay is intended as an aid in the diagnosis of influenza from Nasopharyngeal swab specimens and should not be used as a sole basis for treatment. Nasal washings and aspirates are unacceptable for Xpert Xpress SARS-CoV-2/FLU/RSV testing.  Fact Sheet for Patients: EntrepreneurPulse.com.au  Fact Sheet for Healthcare Providers: IncredibleEmployment.be  This test is not yet approved or cleared by the [place] FDA and has been authorized for detection and/or diagnosis of SARS-CoV-2 by FDA  under an Emergency Use Authorization (EUA). This EUA will remain in effect (meaning this test can be used) for the duration of the COVID-19 declaration under Section 564(b)(1) of the Act, 21 U.S.C. section 360bbb-3(b)(1), unless the authorization is terminated or revoked.  Performed at [facility], [place] [address] [place], [place] [address]          Radiology Studies: No results found.      Scheduled Meds:  sodium chloride   Intravenous Once   atorvastatin  40 mg Oral Daily   carvedilol  3.125 mg Oral BID WC   furosemide  40 mg Oral BID   insulin aspart  0-15 Units Subcutaneous TID WC   pantoprazole  40 mg Oral BID   potassium chloride  10  mEq Oral Daily   sacubitril-valsartan  1 tablet Oral BID   sotalol  40 mg Oral Q12H   Continuous Infusions:        Jon August, MD [facility] 12/01/2020, 10:33 AM

## 2020-12-01 NOTE — Op Note (Signed)
[name] Patient Name: Jon Williams Procedure Date : 12/01/2020 MRN: [id number] Attending MD: [name] , MD Date of Birth: [birth year] CSN: [id number] Age: 70 Admit Type: Inpatient Procedure:                Upper GI endoscopy Indications:              Iron deficiency anemia Providers:                [name], MD, Dulcy Fanny, Hinton Dyer Referring MD:              Medicines:                Propofol per Anesthesia Complications:             Estimated Blood Loss:     Estimated blood loss was minimal. Procedure:                Pre-Anesthesia Assessment:                           - Prior to the procedure, a History and Physical                            was performed, and patient medications and                            allergies were reviewed. The patient's tolerance of                            previous anesthesia was also reviewed. The risks                            and benefits of the procedure and the sedation                            options and risks were discussed with the patient.                            All questions were answered, and informed consent                            was obtained. Prior Anticoagulants: The patient has                            taken no previous anticoagulant or antiplatelet                            agents. ASA Grade Assessment: III - A patient with                            severe systemic disease. After reviewing the risks                            and benefits, the patient was deemed in  satisfactory condition to undergo the procedure.                           - Sedation was administered by an anesthesia                            professional. Deep sedation was attained.                           After obtaining informed consent, the endoscope was                            passed under direct vision. Throughout the                            procedure, the patient's blood  pressure, pulse, and                            oxygen saturations were monitored continuously. The                            [id number]-H190 ([id number] Olympus endoscope was introduced                            through the mouth, and advanced to the second part                            of duodenum. The upper GI endoscopy was                            accomplished without difficulty. The patient                            tolerated the procedure well. Scope In: Scope Out: Findings:      The esophagus was normal.      A large, fungating and ulcerated, non-circumferential mass with no       bleeding and no stigmata of recent bleeding was found at the incisura.       Biopsies were taken with a cold forceps for histology.      The examined duodenum was normal. Impression:               - Normal esophagus.                           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 Biopsied.                           - Normal examined duodenum. Recommendation:           - Return patient to hospital ward for ongoing care.                           - Resume regular diet.                           -  Continue present medications.                           - Await pathology results.                           - CT scans of the chest/abdomen/pelvis for staging. Procedure Code(s):        --- Professional ---                           [phone], Esophagogastroduodenoscopy, flexible,                            transoral; with biopsy, single or multiple Diagnosis Code(s):        --- Professional ---                           C16.8, Malignant neoplasm of overlapping sites of                            stomach                           D50.9, Iron deficiency anemia, unspecified CPT copyright 2019 [employer]. All rights reserved. The codes documented in this report are preliminary and upon coder review may  be revised to meet current compliance requirements. [name], MD [name],  MD 12/01/2020 2:57:29 PM This report has been signed electronically. Number of Addenda: 0

## 2020-12-01 NOTE — ED Notes (Signed)
Checked patient cbg it was 95 notified RN of blood sugar.patient is resting with family at bedside

## 2020-12-01 NOTE — Plan of Care (Signed)
Pain education

## 2020-12-01 NOTE — Interval H&P Note (Signed)
History and Physical Interval Note:  12/01/2020 2:01 PM  Jon Williams  has presented today for surgery, with the diagnosis of IDA/guaiac positive stools.  The various methods of treatment have been discussed with the patient and family. After consideration of risks, benefits and other options for treatment, the patient has consented to  Procedure(s) with comments: ESOPHAGOGASTRODUODENOSCOPY (EGD) (N/A) - IDA/guaiac positive stools as a surgical intervention.  The patient's history has been reviewed, patient examined, no change in status, stable for surgery.  I have reviewed the patient's chart and labs.  Questions were answered to the patient's satisfaction.     Arek Spadafore D

## 2020-12-01 NOTE — Anesthesia Preprocedure Evaluation (Signed)
Anesthesia Evaluation  Patient identified by MRN, date of birth, ID band Patient awake    Reviewed: Allergy & Precautions, NPO status , Patient's Chart, lab work & pertinent test results  History of Anesthesia Complications Negative for: history of anesthetic complications  Airway Mallampati: III  TM Distance: >3 FB Neck ROM: Full    Dental  (+) Dental Advisory Given, Implants   Pulmonary neg pulmonary ROS,    breath sounds clear to auscultation       Cardiovascular (-) angina+ CAD and +CHF  + dysrhythmias Atrial Fibrillation + Cardiac Defibrillator  Rhythm:Irregular Rate:Normal  Walks 3-4 miles per day without pausing due to angina or SOB  '18 TTE - LV cavity was moderately dilated with mild concentric hypertrophy. EF 15% to 20%. Severe diffuse hypokinesis. Akinesis of the basal-midinferior and inferoseptal myocardium. Grade 3 diastolic dysfunction. Mild to moderate MR. LA was severely dilated. RV systolic function was moderately reduced. RA was moderately dilated. Mild TR. PASP was moderately increased. PA peak pressure: 50 mm Hg  '18 Cath - 1. Mild to moderate, non-obstructive coronary artery disease consistent with non-ischemic cardiomyopathy. 2. Upper normal to mildly elevated left and right heart filling pressures. 3. Mild pulmonary hypertension. 4. Reduced Fick cardiac output/index.    Neuro/Psych negative neurological ROS  negative psych ROS   GI/Hepatic negative GI ROS, Neg liver ROS,   Endo/Other  negative endocrine ROSdiabetes  Renal/GU Renal InsufficiencyRenal disease    Prostate cancer     Musculoskeletal  (+) Arthritis , Osteoarthritis,    Abdominal (+) + obese,   Peds  Hematology negative hematology ROS (+)   Anesthesia Other Findings   Reproductive/Obstetrics                             Anesthesia Physical  Anesthesia Plan  ASA: IV  Anesthesia Plan: MAC    Post-op Pain Management:    Induction: Intravenous  PONV Risk Score and Plan: 1 and Treatment may vary due to age or medical condition and Ondansetron  Airway Management Planned: Nasal Cannula  Additional Equipment: None  Intra-op Plan:   Post-operative Plan:   Informed Consent: I have reviewed the patients History and Physical, chart, labs and discussed the procedure including the risks, benefits and alternatives for the proposed anesthesia with the patient or authorized representative who has indicated his/her understanding and acceptance.     Dental advisory given  Plan Discussed with: CRNA and Anesthesiologist  Anesthesia Plan Comments:         Anesthesia Quick Evaluation

## 2020-12-02 ENCOUNTER — Other Ambulatory Visit: Payer: Self-pay | Admitting: Oncology

## 2020-12-02 ENCOUNTER — Telehealth: Payer: Self-pay | Admitting: Hematology

## 2020-12-02 ENCOUNTER — Encounter (HOSPITAL_COMMUNITY): Payer: Self-pay | Admitting: Gastroenterology

## 2020-12-02 DIAGNOSIS — D649 Anemia, unspecified: Secondary | ICD-10-CM | POA: Diagnosis not present

## 2020-12-02 DIAGNOSIS — K3189 Other diseases of stomach and duodenum: Secondary | ICD-10-CM

## 2020-12-02 DIAGNOSIS — I5022 Chronic systolic (congestive) heart failure: Secondary | ICD-10-CM | POA: Diagnosis not present

## 2020-12-02 DIAGNOSIS — D490 Neoplasm of unspecified behavior of digestive system: Secondary | ICD-10-CM | POA: Diagnosis not present

## 2020-12-02 LAB — BASIC METABOLIC PANEL
Anion gap: 8 (ref 5–15)
BUN: 16 mg/dL (ref 8–23)
CO2: 22 mmol/L (ref 22–32)
Calcium: 8.7 mg/dL — ABNORMAL LOW (ref 8.9–10.3)
Chloride: 103 mmol/L (ref 98–111)
Creatinine, Ser: 1.17 mg/dL (ref 0.61–1.24)
GFR, Estimated: >60 mL/min (ref >60)
Glucose, Bld: 110 mg/dL — ABNORMAL HIGH (ref 70–99)
Potassium: 3.9 mmol/L (ref 3.5–5.1)
Sodium: 133 mmol/L — ABNORMAL LOW (ref 135–145)

## 2020-12-02 LAB — CBC WITH DIFFERENTIAL/PLATELET
Abs Immature Granulocytes: 0.04 10*3/uL (ref 0.00–0.07)
Basophils Absolute: 0 10*3/uL (ref 0.0–0.1)
Basophils Relative: 0 %
Eosinophils Absolute: 0.1 10*3/uL (ref 0.0–0.5)
Eosinophils Relative: 2 %
HCT: 27 % — ABNORMAL LOW (ref 39.0–52.0)
Hemoglobin: 8.3 g/dL — ABNORMAL LOW (ref 13.0–17.0)
Immature Granulocytes: 1 %
Lymphocytes Relative: 10 %
Lymphs Abs: 0.8 10*3/uL (ref 0.7–4.0)
MCH: 24.2 pg — ABNORMAL LOW (ref 26.0–34.0)
MCHC: 30.7 g/dL (ref 30.0–36.0)
MCV: 78.7 fL — ABNORMAL LOW (ref 80.0–100.0)
Monocytes Absolute: 0.7 10*3/uL (ref 0.1–1.0)
Monocytes Relative: 9 %
Neutro Abs: 6.6 10*3/uL (ref 1.7–7.7)
Neutrophils Relative %: 78 %
Platelets: 246 10*3/uL (ref 150–400)
RBC: 3.43 MIL/uL — ABNORMAL LOW (ref 4.22–5.81)
RDW: 18 % — ABNORMAL HIGH (ref 11.5–15.5)
WBC: 8.3 10*3/uL (ref 4.0–10.5)
nRBC: 0 % (ref 0.0–0.2)

## 2020-12-02 LAB — GLUCOSE, CAPILLARY
Glucose-Capillary: 111 mg/dL — ABNORMAL HIGH (ref 70–99)
Glucose-Capillary: 143 mg/dL — ABNORMAL HIGH (ref 70–99)
Glucose-Capillary: 122 mg/dL — ABNORMAL HIGH (ref 70–99)

## 2020-12-02 LAB — BPAM RBC
Blood Product Expiration Date: 202211072359
Blood Product Expiration Date: 202211102359
ISSUE DATE / TIME: 202210191515
ISSUE DATE / TIME: 202210201024
Unit Type and Rh: 5100
Unit Type and Rh: 5100

## 2020-12-02 LAB — TYPE AND SCREEN
ABO/RH(D): O POS
Antibody Screen: NEGATIVE
Unit division: 0
Unit division: 0

## 2020-12-02 LAB — MAGNESIUM: Magnesium: 1.8 mg/dL (ref 1.7–2.4)

## 2020-12-02 MED ORDER — PANTOPRAZOLE SODIUM 40 MG PO TBEC
40.0000 mg | DELAYED_RELEASE_TABLET | Freq: Two times a day (BID) | ORAL | 2 refills | Status: DC
  Filled 2022-08-29 – 2022-09-21 (×2): qty 60, 30d supply, fill #0
  Filled 2022-10-22: qty 60, 30d supply, fill #1
  Filled 2022-11-29: qty 60, 30d supply, fill #2

## 2020-12-02 MED ORDER — ONDANSETRON HCL 4 MG PO TABS: 4.0000 mg | ORAL_TABLET | Freq: Four times a day (QID) | ORAL | 0 refills | Status: DC | PRN

## 2020-12-02 NOTE — Telephone Encounter (Signed)
Scheduled appt per 10/21 referral. Called pt, no answer. Left msg for pt with appt date and time. I requested pt call me back to confirm appt details. Left my direct number to call back.

## 2020-12-02 NOTE — Discharge Summary (Signed)
Physician Discharge Summary  Jon Williams WCH:[id number] DOB: [birth year] DOA: 11/30/2020  PCP: [name], MD  Admit date: 11/30/2020 Discharge date: 12/02/2020  Admitted From: Home Disposition: Home  Recommendations for Outpatient Follow-up:  Follow up with PCP in 1 week with repeat CBC/BMP Outpatient follow-up with oncology and general surgery Outpatient follow-up with GI/Dr. Benson Norway if needed. Outpatient follow-up with cardiology Follow up in ED if symptoms worsen or new appear   Home Health: No Equipment/Devices: None  Discharge Condition: Stable CODE STATUS: Full Diet recommendation: Heart healthy/carb modified/fluid restriction of up to 1200 cc a day  Brief/Interim Summary: 70 y.o. male with medical history significant of chronic systolic CHF (LVEF 15 to 35% 2018) on AICD, HTN, PAF on Xarelto, CAD, IIDM, prostate cancer, recent treatment of possible H. pylori gastritis presented with generalized weakness and fatigue.  His hemoglobin was 7.4 at his PCPs office and repeat hemoglobin in the ED was 7.1.  He was started on Protonix.  He underwent 2 units packed red cell transfusion during the hospitalization.  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  CT of the chest/abdomen/pelvis did not show any metastasis.  General surgery and oncology were consulted who recommended outpatient follow-up for the same.  Hemoglobin has remained stable.  He will be discharged home today off of Xarelto with close outpatient follow-up with PCP/oncology/general surgery.  Discharge Diagnoses:   New diagnosis of gastric mass likely malignancy Symptomatic anemia Possible GI bleeding -Presented with hemoglobin of 7.4 at PCPs office and repeat hemoglobin in the ED was 7.1. -He underwent 2 units packed red cell transfusion during the hospitalization.  Hemoglobin 8.3 today.  EGD on 12/01/2020 showed possible malignant gastric tumor at the incisura which  was biopsied.  Pathology pending.  CT of the chest/abdomen/pelvis did not show any metastasis.  General surgery and oncology were consulted who recommended outpatient follow-up for the same.  Hemoglobin has remained stable.  He will be discharged home today off of Xarelto with close outpatient follow-up with PCP/oncology/general surgery. -Continue PPI.  Chronic systolic heart failure with history of AICD -Currently compensated.  Continue Coreg, Lasix and Entresto.  Outpatient follow-up with cardiology -Continue diet and fluid restriction.  Paroxysmal A. fib -Rate controlled.  Continue sotalol and Coreg.  We will keep Xarelto on hold.  Hyperlipidemia -Continue statin  Diabetes mellitus type 2 -A1c 6.2.  Carb modified diet.  Continue metformin upon discharge.    Discharge Instructions  Discharge Instructions     Ambulatory referral to Hematology / Oncology   Complete by: As directed    Diet - low sodium heart healthy   Complete by: As directed    Diet Carb Modified   Complete by: As directed    Increase activity slowly   Complete by: As directed       Allergies as of 12/02/2020       Reactions   Aspirin Anaphylaxis, Hives   Sulfa Antibiotics Anaphylaxis, Hives   Other Other (See Comments)        Medication List     STOP taking these medications    rivaroxaban 20 MG Tabs tablet Commonly known as: Xarelto       TAKE these medications    atorvastatin 40 MG tablet Commonly known as: LIPITOR TAKE 1 TABLET(40 MG) BY MOUTH DAILY   carvedilol 3.125 MG tablet Commonly known as: COREG TAKE 1 TABLET(3.125 MG) BY MOUTH TWICE DAILY What changed: See the new instructions.   cholecalciferol 25  MCG (1000 UNIT) tablet Commonly known as: VITAMIN D3 Take 1,000 Units by mouth daily with lunch.   CLEAR EYES OP Place 1 drop into both eyes daily as needed (irritation).   Entresto 24-26 MG Generic drug: sacubitril-valsartan Take 1 tablet by mouth 2 (two) times daily.    furosemide 40 MG tablet Commonly known as: LASIX Take 1 tablet (40 mg total) by mouth 2 (two) times daily.   metFORMIN 500 MG 24 hr tablet Commonly known as: GLUCOPHAGE-XR Take 500 mg by mouth every evening.   multivitamin with minerals Tabs tablet Take 1 tablet by mouth daily with lunch. One-A-Day   ondansetron 4 MG tablet Commonly known as: ZOFRAN Take 1 tablet (4 mg total) by mouth every 6 (six) hours as needed for nausea.   pantoprazole 40 MG tablet Commonly known as: PROTONIX Take 1 tablet (40 mg total) by mouth 2 (two) times daily.   potassium chloride 10 MEQ tablet Commonly known as: KLOR-CON TAKE 1 TABLET(10 MEQ) BY MOUTH TWICE DAILY What changed:  how much to take how to take this when to take this additional instructions   sotalol 80 MG tablet Commonly known as: BETAPACE TAKE 1 AND 1/2 TABLETS(120 MG) BY MOUTH TWICE DAILY What changed: See the new instructions.        Follow-up Information     Stark Klein, MD Follow up on 12/06/2020.   Specialty: General Surgery Why: Please follow up with Dr. Barry Dienes on 12/06/20 at 1:45 pm for a 2:15 pm appointment to complete check in process. Please bring photo ID and insurance card if you have them Contact information: [address] [place] [address] [phone]         [name], MD. Schedule an appointment as soon as possible for a visit in 1 week(s).   Specialty: Internal Medicine Why: with repeat cbc/bmp Contact information: [address] [place] [address] [phone]         [name], [name], MD .   Specialty: Cardiology Contact information: [address] [place] [address] [phone]                Allergies  Allergen Reactions   Aspirin Anaphylaxis and Hives   Sulfa Antibiotics Anaphylaxis and Hives   Other Other (See Comments)    Consultations: GI/general surgery.  Communicated with oncology/Dr. Irene Limbo via secure  chat who will arrange for outpatient follow-up at earliest convenience   Procedures/Studies: CT CHEST ABDOMEN PELVIS W CONTRAST  Result Date: 12/02/2020 CLINICAL DATA:  Gastric cancer, for staging EXAM: CT CHEST, ABDOMEN, AND PELVIS WITH CONTRAST TECHNIQUE: Multidetector CT imaging of the chest, abdomen and pelvis was performed following the standard protocol during bolus administration of intravenous contrast. CONTRAST:  53mL OMNIPAQUE IOHEXOL 350 MG/ML SOLN COMPARISON:  None. FINDINGS: CT CHEST FINDINGS Cardiovascular: Cardiomegaly.  No pericardial effusion. No evidence of thoracic aortic aneurysm. Three vessel coronary atherosclerosis. Mediastinum/Nodes: No suspicious mediastinal lymphadenopathy. Visualized thyroid is unremarkable. Lungs/Pleura: Mild patchy/ground-glass opacities in the posterior right upper and right middle lobes. Mild patchy/ground-glass opacities in the bilateral lower lobes. This appearance favors multifocal pneumonia, likely on the basis of aspiration. No suspicious pulmonary nodules. Trace right pleural fluid (series 3/image 59).  No pneumothorax. Musculoskeletal: Visualized osseous structures are within normal limits. CT ABDOMEN PELVIS FINDINGS Motion degraded images. Hepatobiliary: Liver is within normal limits. Gallbladder is unremarkable. No intrahepatic or extrahepatic ductal dilatation. Pancreas: Within normal limits. Spleen: Within normal limits. Adrenals/Urinary Tract: 13  mm left adrenal nodule (series 3/image 69). Right adrenal gland is within normal limits. Kidneys are within normal limits.  No hydronephrosis. Bladder is mildly thick-walled but underdistended. Stomach/Bowel: Focal wall thickening along the posterior aspect of the gastric antrum (series 3/image 35), likely corresponding to the patient's newly diagnosed gastric cancer. No evidence of bowel obstruction. Normal appendix (series 3/image 92). Left colon is decompressed. Vascular/Lymphatic: No evidence of  abdominal aortic aneurysm. Atherosclerotic calcifications of the abdominal aorta and branch vessels. No suspicious abdominopelvic lymphadenopathy. Specifically, no gastrohepatic or celiac axis nodes. Reproductive: Prostate is notable for fiducial markers. Other: No abdominopelvic ascites. Small fat containing periumbilical hernia. Musculoskeletal: Degenerative changes of the lumbar spine. Grade 2 spondylolisthesis of L4 on L5. IMPRESSION: Focal wall thickening along the posterior aspect of the gastric antrum, likely corresponding to the patient's newly diagnosed gastric cancer. No findings suspicious for metastatic disease. Mild multifocal pneumonia, lower lobe predominant, likely on the basis of aspiration. Trace right pleural effusion. Fiducial markers along the prostate in this patient with known prostate cancer. Electronically Signed   By: Julian Hy M.D.   On: 12/02/2020 01:11    EGD on 12/01/2020 Impression:               - Normal esophagus.                           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 Biopsied.                           - Normal examined duodenum. Recommendation:           - Return patient to hospital ward for ongoing care.                           - Resume regular diet.                           - Continue present medications.                           - Await pathology results.                           - CT scans of the chest/abdomen/pelvis for staging.  Subjective: Patient seen and examined at bedside.  Denies any abdominal pain, nausea, vomiting, black or bloody stools or worsening shortness of breath.  Feels okay to go home today.  Discharge Exam: Vitals:   12/02/20 0458 12/02/20 0820  BP: 99/69 116/63  Pulse: 69 65  Resp: 18 17  Temp: 99 F (37.2 C) 98.4 F (36.9 C)  SpO2: 98% 98%    General: Pt is alert, awake, not in acute distress.  Currently on room air. Cardiovascular: rate controlled, S1/S2 + Respiratory: bilateral decreased breath  sounds at bases Abdominal: Soft, NT, ND, bowel sounds + Extremities: Trace lower extremity edema, no cyanosis    The results of significant diagnostics from this hospitalization (including imaging, microbiology, ancillary and laboratory) are listed below for reference.     Microbiology: Recent Results (from the past 240 hour(s))  Resp Panel by RT-PCR (Flu A&B, Covid) Nasopharyngeal Swab     Status: None   Collection Time: 11/30/20  2:17 PM   Specimen: Nasopharyngeal Swab; Nasopharyngeal(NP)  swabs in vial transport medium  Result Value Ref Range Status   SARS Coronavirus 2 by RT PCR NEGATIVE NEGATIVE Final    Comment: (NOTE) SARS-CoV-2 target nucleic acids are NOT DETECTED.  The SARS-CoV-2 RNA is generally detectable in upper respiratory specimens during the acute phase of infection. The lowest concentration of SARS-CoV-2 viral copies this assay can detect is 138 copies/mL. A negative result does not preclude SARS-Cov-2 infection and should not be used as the sole basis for treatment or other patient management decisions. A negative result may occur with  improper specimen collection/handling, submission of specimen other than nasopharyngeal swab, presence of viral mutation(s) within the areas targeted by this assay, and inadequate number of viral copies(<138 copies/mL). A negative result must be combined with clinical observations, patient history, and epidemiological information. The expected result is Negative.  Fact Sheet for Patients:  EntrepreneurPulse.com.au  Fact Sheet for Healthcare Providers:  IncredibleEmployment.be  This test is no t yet approved or cleared by the [place] FDA and  has been authorized for detection and/or diagnosis of SARS-CoV-2 by FDA under an Emergency Use Authorization (EUA). This EUA will remain  in effect (meaning this test can be used) for the duration of the COVID-19 declaration under Section  564(b)(1) of the Act, 21 U.S.C.section 360bbb-3(b)(1), unless the authorization is terminated  or revoked sooner.       Influenza A by PCR NEGATIVE NEGATIVE Final   Influenza B by PCR NEGATIVE NEGATIVE Final    Comment: (NOTE) The Xpert Xpress SARS-CoV-2/FLU/RSV plus assay is intended as an aid in the diagnosis of influenza from Nasopharyngeal swab specimens and should not be used as a sole basis for treatment. Nasal washings and aspirates are unacceptable for Xpert Xpress SARS-CoV-2/FLU/RSV testing.  Fact Sheet for Patients: EntrepreneurPulse.com.au  Fact Sheet for Healthcare Providers: IncredibleEmployment.be  This test is not yet approved or cleared by the [place] FDA and has been authorized for detection and/or diagnosis of SARS-CoV-2 by FDA under an Emergency Use Authorization (EUA). This EUA will remain in effect (meaning this test can be used) for the duration of the COVID-19 declaration under Section 564(b)(1) of the Act, 21 U.S.C. section 360bbb-3(b)(1), unless the authorization is terminated or revoked.  Performed at [facility], [place] [address] [place], [place] [address]      Labs: BNP (last 3 results) No results for input(s): BNP in the last 8760 hours. Basic Metabolic Panel: Recent Labs  Lab 11/30/20 1334 12/01/20 0458 12/02/20 0413  NA 139 136 133*  K 3.3* 3.8 3.9  CL 107 107 103  CO2 25 22 22   GLUCOSE 145* 96 110*  BUN 19 18 16   CREATININE 1.15 1.05 1.17  CALCIUM 9.1 8.7* 8.7*  MG  --  2.0 1.8   Liver Function Tests: Recent Labs  Lab 11/30/20 1334  AST 19  ALT 17  ALKPHOS 52  BILITOT 0.6  PROT 6.9  ALBUMIN 3.8   No results for input(s): LIPASE, AMYLASE in the last 168 hours. No results for input(s): AMMONIA in the last 168 hours. CBC: Recent Labs  Lab 11/30/20 1334 12/01/20 0458 12/02/20 0413  WBC 5.8 6.4 8.3  NEUTROABS 4.1  --  6.6  HGB 7.1* 7.2* 8.3*  HCT 24.3* 24.2* 27.0*  MCV  78.6* 79.9* 78.7*  PLT 288 253 246   Cardiac Enzymes: No results for input(s): CKTOTAL, CKMB, CKMBINDEX, TROPONINI in the last 168 hours. BNP: Invalid input(s): POCBNP CBG: Recent Labs  Lab 12/01/20 0815 12/01/20  1219 12/01/20 1758 12/01/20 2137 12/02/20 0637  GLUCAP 95 91 127* 170* 111*   D-Dimer No results for input(s): DDIMER in the last 72 hours. Hgb A1c Recent Labs    12/01/20 0458  HGBA1C 6.2*   Lipid Profile No results for input(s): CHOL, HDL, LDLCALC, TRIG, CHOLHDL, LDLDIRECT in the last 72 hours. Thyroid function studies No results for input(s): TSH, T4TOTAL, T3FREE, THYROIDAB in the last 72 hours.  Invalid input(s): FREET3 Anemia work up Recent Labs    11/30/20 1408  VITAMINB12 438  FOLATE 28.4  FERRITIN 6*  TIBC 420  IRON 13*  RETICCTPCT 1.1   Urinalysis    Component Value Date/Time   COLORURINE YELLOW 08/05/2017 1344   APPEARANCEUR HAZY (A) 08/05/2017 1344   LABSPEC 1.020 08/05/2017 1344   PHURINE 5.0 08/05/2017 1344   GLUCOSEU NEGATIVE 08/05/2017 1344   HGBUR NEGATIVE 08/05/2017 1344   [place] 08/05/2017 1344   KETONESUR NEGATIVE 08/05/2017 1344   PROTEINUR NEGATIVE 08/05/2017 1344   NITRITE NEGATIVE 08/05/2017 1344   LEUKOCYTESUR NEGATIVE 08/05/2017 1344   Sepsis Labs Invalid input(s): PROCALCITONIN,  WBC,  LACTICIDVEN Microbiology Recent Results (from the past 240 hour(s))  Resp Panel by RT-PCR (Flu A&B, Covid) Nasopharyngeal Swab     Status: None   Collection Time: 11/30/20  2:17 PM   Specimen: Nasopharyngeal Swab; Nasopharyngeal(NP) swabs in vial transport medium  Result Value Ref Range Status   SARS Coronavirus 2 by RT PCR NEGATIVE NEGATIVE Final    Comment: (NOTE) SARS-CoV-2 target nucleic acids are NOT DETECTED.  The SARS-CoV-2 RNA is generally detectable in upper respiratory specimens during the acute phase of infection. The lowest concentration of SARS-CoV-2 viral copies this assay can detect is 138 copies/mL.  A negative result does not preclude SARS-Cov-2 infection and should not be used as the sole basis for treatment or other patient management decisions. A negative result may occur with  improper specimen collection/handling, submission of specimen other than nasopharyngeal swab, presence of viral mutation(s) within the areas targeted by this assay, and inadequate number of viral copies(<138 copies/mL). A negative result must be combined with clinical observations, patient history, and epidemiological information. The expected result is Negative.  Fact Sheet for Patients:  EntrepreneurPulse.com.au  Fact Sheet for Healthcare Providers:  IncredibleEmployment.be  This test is no t yet approved or cleared by the [place] FDA and  has been authorized for detection and/or diagnosis of SARS-CoV-2 by FDA under an Emergency Use Authorization (EUA). This EUA will remain  in effect (meaning this test can be used) for the duration of the COVID-19 declaration under Section 564(b)(1) of the Act, 21 U.S.C.section 360bbb-3(b)(1), unless the authorization is terminated  or revoked sooner.       Influenza A by PCR NEGATIVE NEGATIVE Final   Influenza B by PCR NEGATIVE NEGATIVE Final    Comment: (NOTE) The Xpert Xpress SARS-CoV-2/FLU/RSV plus assay is intended as an aid in the diagnosis of influenza from Nasopharyngeal swab specimens and should not be used as a sole basis for treatment. Nasal washings and aspirates are unacceptable for Xpert Xpress SARS-CoV-2/FLU/RSV testing.  Fact Sheet for Patients: EntrepreneurPulse.com.au  Fact Sheet for Healthcare Providers: IncredibleEmployment.be  This test is not yet approved or cleared by the [place] FDA and has been authorized for detection and/or diagnosis of SARS-CoV-2 by FDA under an Emergency Use Authorization (EUA). This EUA will remain in effect (meaning this test  can be used) for the duration of the COVID-19 declaration under Section 564(b)(1) of  the Act, 21 U.S.C. section 360bbb-3(b)(1), unless the authorization is terminated or revoked.  Performed at [facility], [place] [address] [place], [place] [address]      Time coordinating discharge: 35 minutes  SIGNED:   Aline August, MD  [facility] 12/02/2020, 11:28 AM

## 2020-12-02 NOTE — Evaluation (Signed)
Physical Therapy Evaluation and Discharge Patient Details Name: Jon Williams MRN: [id number] DOB: Feb 21, 1950 Today's Date: 12/02/2020  History of Present Illness  Pt is a 70 y/o male admitted secondary to symptomatic anemia on 10/19. Pt is s/p EGD on 10/20 that showed gastric mass. PMH includes CHF, AICD, HTN, PAF on Xarelto, CAD, IIDM, prostate cancer.  Clinical Impression  Patient evaluated by Physical Therapy with no further acute PT needs identified. All education has been completed and the patient has no further questions. Pt overall at a mod I to supervision level for gait and stair navigation. Pt requiring 1 standing rest secondary to SOB. Educated about pursed lip breathing, walking program, and activity pacing at home. Reports wife can assist if needed. See below for any follow-up Physical Therapy or equipment needs. PT is signing off. Thank you for this referral. If needs change, please re-consult.         Recommendations for follow up therapy are one component of a multi-disciplinary discharge planning process, led by the attending physician.  Recommendations may be updated based on patient status, additional functional criteria and insurance authorization.  Follow Up Recommendations No PT follow up    Equipment Recommendations  None recommended by PT    Recommendations for Other Services       Precautions / Restrictions Precautions Precautions: Fall Restrictions Weight Bearing Restrictions: No      Mobility  Bed Mobility               General bed mobility comments: In chair upon entry    Transfers Overall transfer level: Modified independent                  Ambulation/Gait Ambulation/Gait assistance: Modified independent (Device/Increase time) Gait Distance (Feet): 120 Feet Assistive device: None Gait Pattern/deviations: Step-through pattern;Decreased stride length Gait velocity: Mildly decreased   General Gait Details: Mildly slower gait.  Fatigue and SOB noted, and required 1 standing rest. No physical assist required.  Stairs Stairs: Yes Stairs assistance: Supervision Stair Management: Two rails;Alternating pattern;Forwards Number of Stairs: 4 General stair comments: Overall steady stair navigation. Supervision for safety. Mild SOB noted.  Wheelchair Mobility    Modified Rankin (Stroke Patients Only)       Balance Overall balance assessment: No apparent balance deficits (not formally assessed)                                           Pertinent Vitals/Pain Pain Assessment: No/denies pain    Home Living Family/patient expects to be discharged to:: Private residence Living Arrangements: Spouse/significant other Available Help at Discharge: Family;Available 24 hours/day Type of Home: House Home Access: Stairs to enter Entrance Stairs-Rails: Right;Left;Can reach both Entrance Stairs-Number of Steps: 4 Home Layout: Other (Comment) (2 steps down into laundry room) Home Equipment: None      Prior Function Level of Independence: Independent               Hand Dominance        Extremity/Trunk Assessment   Upper Extremity Assessment Upper Extremity Assessment: Overall WFL for tasks assessed    Lower Extremity Assessment Lower Extremity Assessment: Overall WFL for tasks assessed    Cervical / Trunk Assessment Cervical / Trunk Assessment: Normal  Communication   Communication: No difficulties  Cognition Arousal/Alertness: Awake/alert Behavior During Therapy: WFL for tasks assessed/performed Overall Cognitive Status: Within Functional Limits for  tasks assessed                                        General Comments General comments (skin integrity, edema, etc.): Educated about activity pacing and walking program    Exercises     Assessment/Plan    PT Assessment Patent does not need any further PT services  PT Problem List         PT Treatment  Interventions      PT Goals (Current goals can be found in the Care Plan section)  Acute Rehab PT Goals Patient Stated Goal: to get back to normal activities PT Goal Formulation: With patient Time For Goal Achievement: 12/02/20 Potential to Achieve Goals: Good    Frequency     Barriers to discharge        Co-evaluation               AM-PAC PT "6 Clicks" Mobility  Outcome Measure Help needed turning from your back to your side while in a flat bed without using bedrails?: None Help needed moving from lying on your back to sitting on the side of a flat bed without using bedrails?: None Help needed moving to and from a bed to a chair (including a wheelchair)?: None Help needed standing up from a chair using your arms (e.g., wheelchair or bedside chair)?: None Help needed to walk in hospital room?: None Help needed climbing 3-5 steps with a railing? : None 6 Click Score: 24    End of Session   Activity Tolerance: Patient tolerated treatment well Patient left: in chair;with call bell/phone within reach;with family/visitor present Nurse Communication: Mobility status PT Visit Diagnosis: Other abnormalities of gait and mobility (R26.89)    Time: 9798-9211 PT Time Calculation (min) (ACUTE ONLY): 15 min   Charges:   PT Evaluation $PT Eval Low Complexity: 1 Low          Lou Miner, DPT  Acute Rehabilitation Services  Pager: [phone] Office: [phone]   Rudean Hitt 12/02/2020, 12:29 PM

## 2020-12-02 NOTE — TOC Transition Note (Signed)
Transition of Care [facility]) - CM/SW Discharge Note   Patient Details  Name: Jon Williams MRN: [id number] Date of Birth: September 22, 1950  Transition of Care [facility] CM/SW Contact:  Tom-Johnson, Renea Ee, RN Phone Number: 12/02/2020, 3:09 PM   Clinical Narrative:    CM spoke with patient about TOC needs for post hospital transition. Patient sitting up in a chair in his room with wife at side. Presented with generalized weakness and fatigue.  His hemoglobin in the ED was 7.1. He was started on Protonix. He had 2 units packed red cell transfusion. 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  Patient states he is from home and lives with his wife. Has two adult children. Independent with care and drives self prior to hospitalization. Retired from [employer]. Does not have DME's. Has a PCP and medically insured by [facility]. Denies any needs. Scheduled for discharge. To follow up with oncology outpatient. Wife to transport. No further TOC needs noted.   Final next level of care: Home/Self Care Barriers to Discharge: No Barriers Identified   Patient Goals and CMS Choice Patient states their goals for this hospitalization and ongoing recovery are:: To go home CMS Medicare.gov Compare Post Acute Care list provided to:: Patient Choice offered to / list presented to : NA  Discharge Placement                       Discharge Plan and Services                DME Arranged: N/A DME Agency: NA       HH Arranged: NA HH Agency: NA        Social Determinants of Health (SDOH) Interventions     Readmission Risk Interventions No flowsheet data found.

## 2020-12-02 NOTE — Progress Notes (Signed)
Subjective: Feeling well.  No complaints post procedure.  Objective: Vital signs in last 24 hours: Temp:  [97.5 F (36.4 C)-99 F (37.2 C)] 98.4 F (36.9 C) (10/21 0820) Pulse Rate:  [56-76] 65 (10/21 0820) Resp:  [15-26] 17 (10/21 0820) BP: (99-130)/(61-87) 116/63 (10/21 0820) SpO2:  [97 %-100 %] 98 % (10/21 0820) Weight:  [94.7 kg-95.7 kg] 95.7 kg (10/21 0320) Last BM Date: 11/30/20  Intake/Output from previous day: 10/20 0701 - 10/21 0700 In: 652 [P.O.:237; I.V.:100; Blood:315] Out: 700 [Urine:700] Intake/Output this shift: No intake/output data recorded.  General appearance: alert and no distress GI: soft, non-tender; bowel sounds normal; no masses,  no organomegaly  Lab Results: Recent Labs    11/30/20 1334 12/01/20 0458 12/02/20 0413  WBC 5.8 6.4 8.3  HGB 7.1* 7.2* 8.3*  HCT 24.3* 24.2* 27.0*  PLT 288 253 246   BMET Recent Labs    11/30/20 1334 12/01/20 0458 12/02/20 0413  NA 139 136 133*  K 3.3* 3.8 3.9  CL 107 107 103  CO2 25 22 22   GLUCOSE 145* 96 110*  BUN 19 18 16   CREATININE 1.15 1.05 1.17  CALCIUM 9.1 8.7* 8.7*   LFT Recent Labs    11/30/20 1334  PROT 6.9  ALBUMIN 3.8  AST 19  ALT 17  ALKPHOS 52  BILITOT 0.6   PT/INR No results for input(s): LABPROT, INR in the last 72 hours. Hepatitis Panel No results for input(s): HEPBSAG, HCVAB, HEPAIGM, HEPBIGM in the last 72 hours. C-Diff No results for input(s): CDIFFTOX in the last 72 hours. Fecal Lactopherrin No results for input(s): FECLLACTOFRN in the last 72 hours.  Studies/Results: CT CHEST ABDOMEN PELVIS W CONTRAST  Result Date: 12/02/2020 CLINICAL DATA:  Gastric cancer, for staging EXAM: CT CHEST, ABDOMEN, AND PELVIS WITH CONTRAST TECHNIQUE: Multidetector CT imaging of the chest, abdomen and pelvis was performed following the standard protocol during bolus administration of intravenous contrast. CONTRAST:  17mL OMNIPAQUE IOHEXOL 350 MG/ML SOLN COMPARISON:  None. FINDINGS: CT CHEST  FINDINGS Cardiovascular: Cardiomegaly.  No pericardial effusion. No evidence of thoracic aortic aneurysm. Three vessel coronary atherosclerosis. Mediastinum/Nodes: No suspicious mediastinal lymphadenopathy. Visualized thyroid is unremarkable. Lungs/Pleura: Mild patchy/ground-glass opacities in the posterior right upper and right middle lobes. Mild patchy/ground-glass opacities in the bilateral lower lobes. This appearance favors multifocal pneumonia, likely on the basis of aspiration. No suspicious pulmonary nodules. Trace right pleural fluid (series 3/image 59).  No pneumothorax. Musculoskeletal: Visualized osseous structures are within normal limits. CT ABDOMEN PELVIS FINDINGS Motion degraded images. Hepatobiliary: Liver is within normal limits. Gallbladder is unremarkable. No intrahepatic or extrahepatic ductal dilatation. Pancreas: Within normal limits. Spleen: Within normal limits. Adrenals/Urinary Tract: 13 mm left adrenal nodule (series 3/image 69). Right adrenal gland is within normal limits. Kidneys are within normal limits.  No hydronephrosis. Bladder is mildly thick-walled but underdistended. Stomach/Bowel: Focal wall thickening along the posterior aspect of the gastric antrum (series 3/image 35), likely corresponding to the patient's newly diagnosed gastric cancer. No evidence of bowel obstruction. Normal appendix (series 3/image 92). Left colon is decompressed. Vascular/Lymphatic: No evidence of abdominal aortic aneurysm. Atherosclerotic calcifications of the abdominal aorta and branch vessels. No suspicious abdominopelvic lymphadenopathy. Specifically, no gastrohepatic or celiac axis nodes. Reproductive: Prostate is notable for fiducial markers. Other: No abdominopelvic ascites. Small fat containing periumbilical hernia. Musculoskeletal: Degenerative changes of the lumbar spine. Grade 2 spondylolisthesis of L4 on L5. IMPRESSION: Focal wall thickening along the posterior aspect of the gastric antrum,  likely corresponding to the patient's  newly diagnosed gastric cancer. No findings suspicious for metastatic disease. Mild multifocal pneumonia, lower lobe predominant, likely on the basis of aspiration. Trace right pleural effusion. Fiducial markers along the prostate in this patient with known prostate cancer. Electronically Signed   By: Julian Hy M.D.   On: 12/02/2020 01:11    Medications: Scheduled:  sodium chloride   Intravenous Once   atorvastatin  40 mg Oral Daily   carvedilol  3.125 mg Oral BID WC   furosemide  40 mg Oral BID   insulin aspart  0-15 Units Subcutaneous TID WC   pantoprazole  40 mg Oral BID   potassium chloride  10 mEq Oral Daily   sacubitril-valsartan  1 tablet Oral BID   sotalol  40 mg Oral Q12H   Continuous:  Assessment/Plan: 1) Large ulcerated gastric mass consistent with a malignancy. 2) Anemia - stable.   The patient is currently stable.  A long discussion was conducted with the patient and his wife about the next steps.  The staging CT scans were negative for any metastatic spread.  Plan: 1) Follow HGB and transfuse as necessary. 2) Hold anticoagulation for now.   3) Oncology and Surgical consultations. 4) Signing off.  Please call with any questions.  LOS: 2 days   Kabrea Seeney D 12/02/2020, 9:19 AM

## 2020-12-02 NOTE — Anesthesia Postprocedure Evaluation (Signed)
Anesthesia Post Note  Patient: Jon Williams  Procedure(s) Performed: ESOPHAGOGASTRODUODENOSCOPY (EGD) BIOPSY     Patient location during evaluation: PACU Anesthesia Type: MAC Level of consciousness: awake and alert Pain management: pain level controlled Vital Signs Assessment: post-procedure vital signs reviewed and stable Respiratory status: spontaneous breathing, nonlabored ventilation and respiratory function stable Cardiovascular status: blood pressure returned to baseline and stable Postop Assessment: no apparent nausea or vomiting Anesthetic complications: no   No notable events documented.  Last Vitals:  Vitals:   12/01/20 2341 12/02/20 0458  BP: 119/87 99/69  Pulse: 76 69  Resp: 20 18  Temp: 36.7 C 37.2 C  SpO2: 98% 98%    Last Pain:  Vitals:   12/02/20 0458  TempSrc: Oral  PainSc:    Pain Goal:                   Lynda Rainwater

## 2020-12-02 NOTE — Care Management Important Message (Signed)
Important Message  Patient Details  Name: Jon Williams MRN: [id number] Date of Birth: 03-26-1950   Medicare Important Message Given:  Yes     [name] 12/02/2020, 3:36 PM

## 2020-12-02 NOTE — Consult Note (Signed)
Consult Note  Jon Williams Aug 04, 1950  [id number].    Requesting MD: Dr. Starla Link Chief Complaint/Reason for Consult: gastric mass  HPI:  70 yo male with medical history significant for CHF on AICD, HTN, PAF on xarelto, CAD, T2DM, prostate cancer who presented to [facility] ED with increasing fatigue and generalized weakness and was admitted to the hospitalist team for symptomatic anemia seen on lab work by PCP. Since admission evaluation has included GI consult EGD, CT chest/abd/pelvis. EDG with malignant gastric tumor at the incisura and pathology pending. CT without findings of metastasis. He has received 2 units PRBCs with hemoglobin improvement from 7.1 to 8.3. He has had recent outpatient colonoscopy with Dr. Benson Norway. General surgery asked to see in regard to gastric tumor.  He also states he has had recent epigastric pain for which he had H pylori test that was positive and started on clarithromycin and augmentin as well as omeprazole with improvement in symptoms. He state he has had worsening SHOB with exertion, dizziness, and blurry vision which have all improved since admission. He has a dry cough without SHOB currently. He states he has normal appetite and has and good PO intake.  He denies melena or hematochezia, early satiety, fever, chills, recent nausea/emesis. ROS otherwise as below  Substance use: none - no prior tobacco use Allergies: sulfa and ASA - anaphylaxis Blood thinners: eliquis   ROS: Review of Systems  Constitutional:  Positive for malaise/fatigue. Negative for chills and fever.  Respiratory:  Positive for cough (dry) and shortness of breath. Negative for sputum production and wheezing.   Cardiovascular:  Negative for chest pain, palpitations and leg swelling.  Gastrointestinal:  Positive for heartburn. Negative for abdominal pain, blood in stool, constipation, diarrhea, melena, nausea and vomiting.  Genitourinary: Negative.   Neurological:  Positive for  dizziness.   Family History  Problem Relation Age of Onset   Hypertension Mother    Heart disease Mother    Diabetes Mother    Diabetes Father    Hypertension Father    Cancer Father    Healthy Sister    Heart attack Brother    Heart disease Brother 40       + tobacco   Healthy Brother     Past Medical History:  Diagnosis Date   AICD (automatic cardioverter/defibrillator) present 05/25/2016   biv icd   Bunion, right    Chronic combined systolic and diastolic CHF (congestive heart failure) ([place])    Echo 1/18: Mild conc LVH, EF 15-20, severe diff HK, inf and inf-septal AK, Gr 3 DD, mild to mod MR, severe LAE, mod reduced RVSF, mod RAE, mild TR, PASP 50   Coronary artery disease involving native coronary artery without angina pectoris 04/17/2016   LHC 1/18: pLCx 92, mLCx 20, mRCA 40, dRCA 20, LVEDP 23, mean RA 8, PA 42/20, PCWP 17   Diabetes mellitus without complication (HCC)    DJD (degenerative joint disease)    History of atrial fibrillation    History of atrial flutter    History of cardiomegaly 06/07/2016   Noted on CXR   History of colon polyps 06/28/2017   Noted on colonoscopy   LBBB (left bundle branch block)    NICM (nonischemic cardiomyopathy) ([place])    Echo 1/18:  Mild conc LVH, EF 15-20, severe diff HK, inf and inf-septal AK, Gr 3 DD, mild to mod MR, severe LAE, mod reduced RVSF, mod RAE, mild TR, PASP 50   OA (osteoarthritis)  knee   Other secondary pulmonary hypertension ([place]) 04/17/2016   Prostate cancer ([place]) 2019   Sigmoid diverticulosis 06/28/2017   Noted on colonoscopy    Past Surgical History:  Procedure Laterality Date   BIV ICD INSERTION CRT-D N/A 05/25/2016   Procedure: BiV ICD Insertion CRT-D;  Surgeon: [name], MD;  Location: [place] [facility];  Service: Cardiovascular;  Laterality: N/A;   CARDIAC CATHETERIZATION N/A 03/02/2016   Procedure: Right/Left Heart Cath and Coronary Angiography;  Surgeon: Nelva Bush, MD;  Location: [place]  [facility];  Service: Cardiovascular;  Laterality: N/A;   CARDIOVERSION N/A 07/17/2016   Procedure: Cardioversion;  Surgeon: [name], MD;  Location: [place] [facility];  Service: Cardiovascular;  Laterality: N/A;   COLONOSCOPY WITH PROPOFOL N/A 06/28/2017   Procedure: COLONOSCOPY WITH PROPOFOL;  Surgeon: [name], MD;  Location: [facility];  Service: Endoscopy;  Laterality: N/A;   colonscopy  2009   GOLD SEED IMPLANT N/A 12/24/2017   Procedure: GOLD SEED IMPLANT, TRANSERINEAL;  Surgeon: Festus Aloe, MD;  Location: [facility];  Service: Urology;  Laterality: N/A;   INSERT / REPLACE / REMOVE PACEMAKER     LEAD REVISION  10/10/2018   LEAD REVISION/REPAIR N/A 10/10/2018   Procedure: LEAD REVISION/REPAIR;  Surgeon: [name], MD;  Location: [place] [facility];  Service: Cardiovascular;  Laterality: N/A;   POLYPECTOMY  06/28/2017   Procedure: POLYPECTOMY;  Surgeon: [name], MD;  [facility];  Service: Endoscopy;;  ascending and descending colon polyp   PROSTATE BIOPSY  02/20/2017   SPACE OAR INSTILLATION N/A 12/24/2017   Procedure: SPACE OAR INSTILLATION;  Surgeon: Festus Aloe, MD;  [facility];  Service: Urology;  Laterality: N/A;   TOTAL KNEE ARTHROPLASTY Right 08/16/2017   Procedure: RIGHT TOTAL KNEE ARTHROPLASTY;  Surgeon: [name], MD;  Location: [facility];  Service: Orthopedics;  Laterality: Right;    Social History:  reports that he has never smoked. He has never used smokeless tobacco. He reports that he does not drink alcohol and does not use drugs.  Allergies:  Allergies  Allergen Reactions   Aspirin Anaphylaxis and Hives   Sulfa Antibiotics Anaphylaxis and Hives   Other Other (See Comments)    Medications Prior to Admission  Medication Sig Dispense Refill   atorvastatin (LIPITOR) 40 MG tablet TAKE 1 TABLET(40 MG) BY MOUTH DAILY 90 tablet 0   carvedilol (COREG) 3.125 MG tablet TAKE 1 TABLET(3.125 MG) BY MOUTH TWICE DAILY (Patient taking  differently: Take 3.125 mg by mouth 2 (two) times daily with a meal.) 180 tablet 0   cholecalciferol (VITAMIN D3) 25 MCG (1000 UT) tablet Take 1,000 Units by mouth daily with lunch.      furosemide (LASIX) 40 MG tablet Take 1 tablet (40 mg total) by mouth 2 (two) times daily. 180 tablet 3   metFORMIN (GLUCOPHAGE-XR) 500 MG 24 hr tablet Take 500 mg by mouth every evening.     Multiple Vitamin (MULTIVITAMIN WITH MINERALS) TABS tablet Take 1 tablet by mouth daily with lunch. One-A-Day     Naphazoline HCl (CLEAR EYES OP) Place 1 drop into both eyes daily as needed (irritation).     potassium chloride (KLOR-CON) 10 MEQ tablet TAKE 1 TABLET(10 MEQ) BY MOUTH TWICE DAILY (Patient taking differently: Take 10 mEq by mouth 2 (two) times daily.) 180 tablet 3   rivaroxaban (XARELTO) 20 MG TABS tablet Take 1 tablet (20 mg total) by mouth daily with supper. 30 tablet 2  sacubitril-valsartan (ENTRESTO) 24-26 MG Take 1 tablet by mouth 2 (two) times daily. 180 tablet 2   sotalol (BETAPACE) 80 MG tablet TAKE 1 AND 1/2 TABLETS(120 MG) BY MOUTH TWICE DAILY (Patient taking differently: Take 120 mg by mouth daily.) 270 tablet 0    Blood pressure 116/63, pulse 65, temperature 98.4 F (36.9 C), temperature source Oral, resp. rate 17, height 5\' 6"  (1.676 m), weight 95.7 kg, SpO2 98 %. Physical Exam:  General: pleasant, WD, male who is sitting up in NAD HEENT: head is normocephalic, atraumatic.  Sclera are noninjected.  Pupils equal and round.  Ears and nose without any masses or lesions.  Mouth is pink and moist Heart: regular, rate, and rhythm.  Normal s1,s2. No obvious murmurs, gallops, or rubs noted.  Palpable radial and pedal pulses bilaterally Lungs: CTAB, no wheezes, rhonchi, or rales noted.  Respiratory effort nonlabored Abd: soft, NT, ND, +BS, no masses, hernias, or organomegaly MS: all 4 extremities are symmetrical with no cyanosis, clubbing, or edema. Skin: warm and dry with no masses, lesions, or  rashes Neuro: Cranial nerves 2-12 grossly intact, sensation is normal throughout Psych: A&Ox3 with an appropriate affect.   Results for orders placed or performed during the hospital encounter of 11/30/20 (from the past 48 hour(s))  Type and screen [place] [employer]     Status: None   Collection Time: 11/30/20  1:20 PM  Result Value Ref Range   ABO/RH(D) O POS    Antibody Screen NEG    Sample Expiration 12/03/2020,2359    Unit Number [id number]    Blood Component Type RED CELLS,LR    Unit division 00    Status of Unit ISSUED,FINAL    Transfusion Status OK TO TRANSFUSE    Crossmatch Result Compatible    Unit Number [id number]    Blood Component Type RED CELLS,LR    Unit division 00    Status of Unit ISSUED,FINAL    Transfusion Status OK TO TRANSFUSE    Crossmatch Result      Compatible Performed at [facility], [place] [address] [place], [place] [address]   CBC with Differential     Status: Abnormal   Collection Time: 11/30/20  1:34 PM  Result Value Ref Range   WBC 5.8 4.0 - 10.5 K/uL   RBC 3.09 (L) 4.22 - 5.81 MIL/uL   Hemoglobin 7.1 (L) 13.0 - 17.0 g/dL   HCT 24.3 (L) 39.0 - 52.0 %   MCV 78.6 (L) 80.0 - 100.0 fL   MCH 23.0 (L) 26.0 - 34.0 pg   MCHC 29.2 (L) 30.0 - 36.0 g/dL   RDW 18.0 (H) 11.5 - 15.5 %   Platelets 288 150 - 400 K/uL   nRBC 0.0 0.0 - 0.2 %   Neutrophils Relative % 72 %   Neutro Abs 4.1 1.7 - 7.7 K/uL   Lymphocytes Relative 17 %   Lymphs Abs 1.0 0.7 - 4.0 K/uL   Monocytes Relative 9 %   Monocytes Absolute 0.5 0.1 - 1.0 K/uL   Eosinophils Relative 2 %   Eosinophils Absolute 0.1 0.0 - 0.5 K/uL   Basophils Relative 0 %   Basophils Absolute 0.0 0.0 - 0.1 K/uL   Immature Granulocytes 0 %   Abs Immature Granulocytes 0.01 0.00 - 0.07 K/uL    Comment: Performed at [facility], [address], [place], [place] [address]  Comprehensive metabolic panel     Status: Abnormal   Collection Time: 11/30/20  1:34  PM  Result Value Ref  Range   Sodium 139 135 - 145 mmol/L   Potassium 3.3 (L) 3.5 - 5.1 mmol/L   Chloride 107 98 - 111 mmol/L   CO2 25 22 - 32 mmol/L   Glucose, Bld 145 (H) 70 - 99 mg/dL    Comment: Glucose reference range applies only to samples taken after fasting for at least 8 hours.   BUN 19 8 - 23 mg/dL   Creatinine, Ser 1.15 0.61 - 1.24 mg/dL   Calcium 9.1 8.9 - 10.3 mg/dL   Total Protein 6.9 6.5 - 8.1 g/dL   Albumin 3.8 3.5 - 5.0 g/dL   AST 19 15 - 41 U/L   ALT 17 0 - 44 U/L   Alkaline Phosphatase 52 38 - 126 U/L   Total Bilirubin 0.6 0.3 - 1.2 mg/dL   GFR, Estimated >60 >60 mL/min    Comment: (NOTE) Calculated using the CKD-EPI Creatinine Equation (2021)    Anion gap 7 5 - 15    Comment: Performed at [facility] [address] [place], [place] [address]  Vitamin B12     Status: None   Collection Time: 11/30/20  2:08 PM  Result Value Ref Range   Vitamin B-12 438 180 - 914 pg/mL    Comment: (NOTE) This assay is not validated for testing neonatal or myeloproliferative syndrome specimens for Vitamin B12 levels. Performed at [facility], [place] [address], [place], [place] [address]   Folate     Status: None   Collection Time: 11/30/20  2:08 PM  Result Value Ref Range   Folate 28.4 >5.9 ng/mL    Comment: Performed at [facility], [place] [address], [place], [place] [address]  Iron and TIBC     Status: Abnormal   Collection Time: 11/30/20  2:08 PM  Result Value Ref Range   Iron 13 (L) 45 - 182 ug/dL   TIBC 420 250 - 450 ug/dL   Saturation Ratios 3 (L) 17.9 - 39.5 %   UIBC 407 ug/dL    Comment: Performed at [facility] [address] [place], [place] [address]  Ferritin     Status: Abnormal   Collection Time: 11/30/20  2:08 PM  Result Value Ref Range   Ferritin 6 (L) 24 - 336 ng/mL    Comment: Performed at [facility], [place] [address], [place], [place] [address]  Reticulocytes     Status: Abnormal   Collection Time: 11/30/20  2:08 PM  Result Value Ref  Range   Retic Ct Pct 1.1 0.4 - 3.1 %   RBC. 2.96 (L) 4.22 - 5.81 MIL/uL   Retic Count, Absolute 32.0 19.0 - 186.0 K/uL   Immature Retic Fract 36.6 (H) 2.3 - 15.9 %    Comment: Performed at [facility] [address] [place], [place] [address]  Prepare RBC (crossmatch)     Status: None   Collection Time: 11/30/20  2:15 PM  Result Value Ref Range   Order Confirmation      ORDER PROCESSED BY BLOOD BANK Performed at [facility], [place] [address], [place], [place] [address]   Resp Panel by RT-PCR (Flu A&B, Covid) Nasopharyngeal Swab     Status: None   Collection Time: 11/30/20  2:17 PM   Specimen: Nasopharyngeal Swab; Nasopharyngeal(NP) swabs in vial transport medium  Result Value Ref Range   SARS Coronavirus 2 by RT PCR NEGATIVE NEGATIVE  Comment: (NOTE) SARS-CoV-2 target nucleic acids are NOT DETECTED.  The SARS-CoV-2 RNA is generally detectable in upper respiratory specimens during the acute phase of infection. The lowest concentration of SARS-CoV-2 viral copies this assay can detect is 138 copies/mL. A negative result does not preclude SARS-Cov-2 infection and should not be used as the sole basis for treatment or other patient management decisions. A negative result may occur with  improper specimen collection/handling, submission of specimen other than nasopharyngeal swab, presence of viral mutation(s) within the areas targeted by this assay, and inadequate number of viral copies(<138 copies/mL). A negative result must be combined with clinical observations, patient history, and epidemiological information. The expected result is Negative.  Fact Sheet for Patients:  EntrepreneurPulse.com.au  Fact Sheet for Healthcare Providers:  IncredibleEmployment.be  This test is no t yet approved or cleared by the [place] FDA and  has been authorized for detection and/or diagnosis of SARS-CoV-2 by FDA under an Emergency Use  Authorization (EUA). This EUA will remain  in effect (meaning this test can be used) for the duration of the COVID-19 declaration under Section 564(b)(1) of the Act, 21 U.S.C.section 360bbb-3(b)(1), unless the authorization is terminated  or revoked sooner.       Influenza A by PCR NEGATIVE NEGATIVE   Influenza B by PCR NEGATIVE NEGATIVE    Comment: (NOTE) The Xpert Xpress SARS-CoV-2/FLU/RSV plus assay is intended as an aid in the diagnosis of influenza from Nasopharyngeal swab specimens and should not be used as a sole basis for treatment. Nasal washings and aspirates are unacceptable for Xpert Xpress SARS-CoV-2/FLU/RSV testing.  Fact Sheet for Patients: EntrepreneurPulse.com.au  Fact Sheet for Healthcare Providers: IncredibleEmployment.be  This test is not yet approved or cleared by the [place] FDA and has been authorized for detection and/or diagnosis of SARS-CoV-2 by FDA under an Emergency Use Authorization (EUA). This EUA will remain in effect (meaning this test can be used) for the duration of the COVID-19 declaration under Section 564(b)(1) of the Act, 21 U.S.C. section 360bbb-3(b)(1), unless the authorization is terminated or revoked.  Performed at [facility], [place] [address] [place], [place] [address]   POC occult blood, ED     Status: Abnormal   Collection Time: 11/30/20  2:43 PM  Result Value Ref Range   Fecal Occult Bld POSITIVE (A) NEGATIVE  CBG monitoring, ED     Status: Abnormal   Collection Time: 11/30/20  8:11 PM  Result Value Ref Range   Glucose-Capillary 115 (H) 70 - 99 mg/dL    Comment: Glucose reference range applies only to samples taken after fasting for at least 8 hours.  Hemoglobin A1c     Status: Abnormal   Collection Time: 12/01/20  4:58 AM  Result Value Ref Range   Hgb A1c MFr Bld 6.2 (H) 4.8 - 5.6 %    Comment: (NOTE) Pre diabetes:          5.7%-6.4%  Diabetes:               >6.4%  Glycemic control for   <7.0% adults with diabetes    Mean Plasma Glucose 131.24 mg/dL    Comment: Performed at [facility] [address] [place], [place] [address]  Magnesium     Status: None   Collection Time: 12/01/20  4:58 AM  Result Value Ref Range   Magnesium 2.0 1.7 - 2.4 mg/dL    Comment: Performed at [facility] [address] [place], [place]  [address]  Basic metabolic panel     Status: Abnormal   Collection Time: 12/01/20  4:58 AM  Result Value Ref Range   Sodium 136 135 - 145 mmol/L   Potassium 3.8 3.5 - 5.1 mmol/L   Chloride 107 98 - 111 mmol/L   CO2 22 22 - 32 mmol/L   Glucose, Bld 96 70 - 99 mg/dL    Comment: Glucose reference range applies only to samples taken after fasting for at least 8 hours.   BUN 18 8 - 23 mg/dL   Creatinine, Ser 1.05 0.61 - 1.24 mg/dL   Calcium 8.7 (L) 8.9 - 10.3 mg/dL   GFR, Estimated >60 >60 mL/min    Comment: (NOTE) Calculated using the CKD-EPI Creatinine Equation (2021)    Anion gap 7 5 - 15    Comment: Performed at [facility] [address], [place], [place] [address]  CBC     Status: Abnormal   Collection Time: 12/01/20  4:58 AM  Result Value Ref Range   WBC 6.4 4.0 - 10.5 K/uL   RBC 3.03 (L) 4.22 - 5.81 MIL/uL   Hemoglobin 7.2 (L) 13.0 - 17.0 g/dL   HCT 24.2 (L) 39.0 - 52.0 %   MCV 79.9 (L) 80.0 - 100.0 fL   MCH 23.8 (L) 26.0 - 34.0 pg   MCHC 29.8 (L) 30.0 - 36.0 g/dL   RDW 18.0 (H) 11.5 - 15.5 %   Platelets 253 150 - 400 K/uL   nRBC 0.3 (H) 0.0 - 0.2 %    Comment: Performed at [facility], [place] [address], [place], [place] [address]  CBG monitoring, ED     Status: None   Collection Time: 12/01/20  8:15 AM  Result Value Ref Range   Glucose-Capillary 95 70 - 99 mg/dL    Comment: Glucose reference range applies only to samples taken after fasting for at least 8 hours.  Prepare RBC (crossmatch)     Status: None   Collection Time: 12/01/20  8:16 AM  Result Value Ref Range   Order  Confirmation      ORDER PROCESSED BY BLOOD BANK Performed at [facility], [address], [place], [place] [address]   CBG monitoring, ED     Status: None   Collection Time: 12/01/20 12:19 PM  Result Value Ref Range   Glucose-Capillary 91 70 - 99 mg/dL    Comment: Glucose reference range applies only to samples taken after fasting for at least 8 hours.  Glucose, capillary     Status: Abnormal   Collection Time: 12/01/20  5:58 PM  Result Value Ref Range   Glucose-Capillary 127 (H) 70 - 99 mg/dL    Comment: Glucose reference range applies only to samples taken after fasting for at least 8 hours.  Glucose, capillary     Status: Abnormal   Collection Time: 12/01/20  9:37 PM  Result Value Ref Range   Glucose-Capillary 170 (H) 70 - 99 mg/dL    Comment: Glucose reference range applies only to samples taken after fasting for at least 8 hours.  CBC with Differential/Platelet     Status: Abnormal   Collection Time: 12/02/20  4:13 AM  Result Value Ref Range   WBC 8.3 4.0 - 10.5 K/uL   RBC 3.43 (L) 4.22 - 5.81 MIL/uL   Hemoglobin 8.3 (L) 13.0 - 17.0 g/dL   HCT 27.0 (L) 39.0 - 52.0 %   MCV 78.7 (L) 80.0 - 100.0 fL  MCH 24.2 (L) 26.0 - 34.0 pg   MCHC 30.7 30.0 - 36.0 g/dL   RDW 18.0 (H) 11.5 - 15.5 %   Platelets 246 150 - 400 K/uL   nRBC 0.0 0.0 - 0.2 %   Neutrophils Relative % 78 %   Neutro Abs 6.6 1.7 - 7.7 K/uL   Lymphocytes Relative 10 %   Lymphs Abs 0.8 0.7 - 4.0 K/uL   Monocytes Relative 9 %   Monocytes Absolute 0.7 0.1 - 1.0 K/uL   Eosinophils Relative 2 %   Eosinophils Absolute 0.1 0.0 - 0.5 K/uL   Basophils Relative 0 %   Basophils Absolute 0.0 0.0 - 0.1 K/uL   Immature Granulocytes 1 %   Abs Immature Granulocytes 0.04 0.00 - 0.07 K/uL    Comment: Performed at [facility] [address] [place], [place] [address]  Basic metabolic panel     Status: Abnormal   Collection Time: 12/02/20  4:13 AM  Result Value Ref Range   Sodium 133 (L) 135 - 145 mmol/L    Potassium 3.9 3.5 - 5.1 mmol/L   Chloride 103 98 - 111 mmol/L   CO2 22 22 - 32 mmol/L   Glucose, Bld 110 (H) 70 - 99 mg/dL    Comment: Glucose reference range applies only to samples taken after fasting for at least 8 hours.   BUN 16 8 - 23 mg/dL   Creatinine, Ser 1.17 0.61 - 1.24 mg/dL   Calcium 8.7 (L) 8.9 - 10.3 mg/dL   GFR, Estimated >60 >60 mL/min    Comment: (NOTE) Calculated using the CKD-EPI Creatinine Equation (2021)    Anion gap 8 5 - 15    Comment: Performed at [facility] [address], [place], [place] [address]  Magnesium     Status: None   Collection Time: 12/02/20  4:13 AM  Result Value Ref Range   Magnesium 1.8 1.7 - 2.4 mg/dL    Comment: Performed at [facility] [address], [place], [place] [address]  Glucose, capillary     Status: Abnormal   Collection Time: 12/02/20  6:37 AM  Result Value Ref Range   Glucose-Capillary 111 (H) 70 - 99 mg/dL    Comment: Glucose reference range applies only to samples taken after fasting for at least 8 hours.   CT CHEST ABDOMEN PELVIS W CONTRAST  Result Date: 12/02/2020 CLINICAL DATA:  Gastric cancer, for staging EXAM: CT CHEST, ABDOMEN, AND PELVIS WITH CONTRAST TECHNIQUE: Multidetector CT imaging of the chest, abdomen and pelvis was performed following the standard protocol during bolus administration of intravenous contrast. CONTRAST:  54mL OMNIPAQUE IOHEXOL 350 MG/ML SOLN COMPARISON:  None. FINDINGS: CT CHEST FINDINGS Cardiovascular: Cardiomegaly.  No pericardial effusion. No evidence of thoracic aortic aneurysm. Three vessel coronary atherosclerosis. Mediastinum/Nodes: No suspicious mediastinal lymphadenopathy. Visualized thyroid is unremarkable. Lungs/Pleura: Mild patchy/ground-glass opacities in the posterior right upper and right middle lobes. Mild patchy/ground-glass opacities in the bilateral lower lobes. This appearance favors multifocal pneumonia, likely on the basis of aspiration. No suspicious pulmonary  nodules. Trace right pleural fluid (series 3/image 59).  No pneumothorax. Musculoskeletal: Visualized osseous structures are within normal limits. CT ABDOMEN PELVIS FINDINGS Motion degraded images. Hepatobiliary: Liver is within normal limits. Gallbladder is unremarkable. No intrahepatic or extrahepatic ductal dilatation. Pancreas: Within normal limits. Spleen: Within normal limits. Adrenals/Urinary Tract: 13 mm left adrenal nodule (series 3/image 69). Right adrenal gland is within normal limits. Kidneys are within normal limits.  No  hydronephrosis. Bladder is mildly thick-walled but underdistended. Stomach/Bowel: Focal wall thickening along the posterior aspect of the gastric antrum (series 3/image 35), likely corresponding to the patient's newly diagnosed gastric cancer. No evidence of bowel obstruction. Normal appendix (series 3/image 92). Left colon is decompressed. Vascular/Lymphatic: No evidence of abdominal aortic aneurysm. Atherosclerotic calcifications of the abdominal aorta and branch vessels. No suspicious abdominopelvic lymphadenopathy. Specifically, no gastrohepatic or celiac axis nodes. Reproductive: Prostate is notable for fiducial markers. Other: No abdominopelvic ascites. Small fat containing periumbilical hernia. Musculoskeletal: Degenerative changes of the lumbar spine. Grade 2 spondylolisthesis of L4 on L5. IMPRESSION: Focal wall thickening along the posterior aspect of the gastric antrum, likely corresponding to the patient's newly diagnosed gastric cancer. No findings suspicious for metastatic disease. Mild multifocal pneumonia, lower lobe predominant, likely on the basis of aspiration. Trace right pleural effusion. Fiducial markers along the prostate in this patient with known prostate cancer. Electronically Signed   By: Julian Hy M.D.   On: 12/02/2020 01:11      Assessment/Plan Gastric mass likely malignancy He has had staging CT and EGD with pathology pending. He will need  follow up with oncology and with [place]. Follow up with [place] has been scheduled with Dr. Barry Dienes which I discussed with patient and wife today Okay for discharge once medically stable per medicine Anemia - Hgb stable at 8.3. Transfuse prn   [facility] 12/02/2020, 11:09 AM Please see Amion for pager number during day hours 7:00am-4:30pm

## 2020-12-02 NOTE — Progress Notes (Signed)
Oncology short note  Patient discharging today Has outpatient f/u with Dr Barry Dienes for likely gastric cancer on 10/25 and Dr Burr Medico for medical oncology on 10/27.  Sullivan Lone MD

## 2020-12-02 NOTE — TOC CM/SW Note (Signed)
HF TOC CM spoke with patient and discussed [facility]. Pt stated he wanted to stay with his Cardiologist, Baldo Daub PA. Pt has appt with Cardiologist on 12/06/2020. HF Navigation referral complete.   [place], Heart Failure TOC CM [phone]

## 2020-12-04 ENCOUNTER — Other Ambulatory Visit: Payer: Self-pay | Admitting: Internal Medicine

## 2020-12-05 DIAGNOSIS — D500 Iron deficiency anemia secondary to blood loss (chronic): Secondary | ICD-10-CM | POA: Diagnosis not present

## 2020-12-05 DIAGNOSIS — C163 Malignant neoplasm of pyloric antrum: Secondary | ICD-10-CM | POA: Diagnosis not present

## 2020-12-05 DIAGNOSIS — Z859 Personal history of malignant neoplasm, unspecified: Secondary | ICD-10-CM | POA: Diagnosis not present

## 2020-12-05 NOTE — Progress Notes (Signed)
Electrophysiology Office Note Date: 12/06/2020  ID:  Jon Williams, DOB [birth year], MRN [id number]  PCP: [name], MD Primary Cardiologist: Mertie Moores, MD Electrophysiologist: Cristopher Peru, MD   CC: Routine ICD follow-up  Jon Williams is a 70 y.o. male seen today for Cristopher Peru, MD for post hospital follow up.    Admitted with generalized weakness and fatigue and found to be profoundly anemic at 7.1. Started on protonix and received transfusion. 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  CT of the chest/abdomen/pelvis did not show any metastasis.  General surgery and oncology were consulted who recommended outpatient follow-up for the same.    Since discharge from hospital the patient reports doing well. He has appointment with Oncology Thursday and is pending plan including possible surgery pending cardiac recommendation.  he denies chest pain, palpitations, dyspnea, PND, orthopnea, nausea, vomiting, dizziness, syncope, edema, weight gain, or early satiety. He has not had ICD shocks.   Device History: St. Jude dual chamber ICD implanted 05/2016, lead revision 09/2018 for chronic systolic CHF  Past Medical History:  Diagnosis Date   AICD (automatic cardioverter/defibrillator) present 05/25/2016   biv icd   Bunion, right    Chronic combined systolic and diastolic CHF (congestive heart failure) ([place])    Echo 1/18: Mild conc LVH, EF 15-20, severe diff HK, inf and inf-septal AK, Gr 3 DD, mild to mod MR, severe LAE, mod reduced RVSF, mod RAE, mild TR, PASP 50   Coronary artery disease involving native coronary artery without angina pectoris 04/17/2016   LHC 1/18: pLCx 84, mLCx 20, mRCA 40, dRCA 20, LVEDP 23, mean RA 8, PA 42/20, PCWP 17   Diabetes mellitus without complication (HCC)    DJD (degenerative joint disease)    History of atrial fibrillation    History of atrial flutter    History of cardiomegaly 06/07/2016    Noted on CXR   History of colon polyps 06/28/2017   Noted on colonoscopy   LBBB (left bundle branch block)    NICM (nonischemic cardiomyopathy) ([place])    Echo 1/18:  Mild conc LVH, EF 15-20, severe diff HK, inf and inf-septal AK, Gr 3 DD, mild to mod MR, severe LAE, mod reduced RVSF, mod RAE, mild TR, PASP 50   OA (osteoarthritis)    knee   Other secondary pulmonary hypertension ([place]) 04/17/2016   Prostate cancer ([place]) 2019   Sigmoid diverticulosis 06/28/2017   Noted on colonoscopy   Past Surgical History:  Procedure Laterality Date   BIOPSY  12/01/2020   Procedure: BIOPSY;  Surgeon: [name], MD;  Location: [facility];  Service: Endoscopy;;   BIV ICD INSERTION CRT-D N/A 05/25/2016   Procedure: BiV ICD Insertion CRT-D;  Surgeon: [name], MD;  Location: [facility];  Service: Cardiovascular;  Laterality: N/A;   CARDIAC CATHETERIZATION N/A 03/02/2016   Procedure: Right/Left Heart Cath and Coronary Angiography;  Surgeon: Nelva Bush, MD;  Location: [facility];  Service: Cardiovascular;  Laterality: N/A;   CARDIOVERSION N/A 07/17/2016   Procedure: Cardioversion;  Surgeon: [name], MD;  Location: [facility];  Service: Cardiovascular;  Laterality: N/A;   COLONOSCOPY WITH PROPOFOL N/A 06/28/2017   Procedure: COLONOSCOPY WITH PROPOFOL;  Surgeon: [name], MD;  Location: [facility];  Service: Endoscopy;  Laterality: N/A;   colonscopy  2009   ESOPHAGOGASTRODUODENOSCOPY N/A 12/01/2020   Procedure: ESOPHAGOGASTRODUODENOSCOPY (EGD);  Surgeon: [name], MD;  Location:  [facility];  Service: Endoscopy;  Laterality: N/A;  IDA/guaiac positive stools   GOLD SEED IMPLANT N/A 12/24/2017   Procedure: GOLD SEED IMPLANT, TRANSERINEAL;  Surgeon: Festus Aloe, MD;  Location: [facility];  Service: Urology;  Laterality: N/A;   INSERT / REPLACE / REMOVE PACEMAKER     LEAD REVISION  10/10/2018   LEAD REVISION/REPAIR N/A 10/10/2018   Procedure: LEAD  REVISION/REPAIR;  Surgeon: [name], MD;  Location: [facility];  Service: Cardiovascular;  Laterality: N/A;   POLYPECTOMY  06/28/2017   Procedure: POLYPECTOMY;  Surgeon: [name], MD;  [facility]: [facility];  Service: Endoscopy;;  ascending and descending colon polyp   PROSTATE BIOPSY  02/20/2017   SPACE OAR INSTILLATION N/A 12/24/2017   Procedure: SPACE OAR INSTILLATION;  Surgeon: Festus Aloe, MD;  Location: [facility];  Service: Urology;  Laterality: N/A;   TOTAL KNEE ARTHROPLASTY Right 08/16/2017   Procedure: RIGHT TOTAL KNEE ARTHROPLASTY;  Surgeon: [name], MD;  Location: [facility];  Service: Orthopedics;  Laterality: Right;    Current Outpatient Medications  Medication Sig Dispense Refill   atorvastatin (LIPITOR) 40 MG tablet TAKE 1 TABLET(40 MG) BY MOUTH DAILY 90 tablet 0   carvedilol (COREG) 3.125 MG tablet TAKE 1 TABLET(3.125 MG) BY MOUTH TWICE DAILY 180 tablet 0   cholecalciferol (VITAMIN D3) 25 MCG (1000 UT) tablet Take 1,000 Units by mouth daily with lunch.      furosemide (LASIX) 40 MG tablet Take 1 tablet (40 mg total) by mouth 2 (two) times daily. 180 tablet 3   metFORMIN (GLUCOPHAGE-XR) 500 MG 24 hr tablet Take 500 mg by mouth every evening.     Multiple Vitamin (MULTIVITAMIN WITH MINERALS) TABS tablet Take 1 tablet by mouth daily with lunch. One-A-Day     Naphazoline HCl (CLEAR EYES OP) Place 1 drop into both eyes daily as needed (irritation).     ondansetron (ZOFRAN) 4 MG tablet Take 1 tablet (4 mg total) by mouth every 6 (six) hours as needed for nausea. 20 tablet 0   pantoprazole (PROTONIX) 40 MG tablet Take 1 tablet (40 mg total) by mouth 2 (two) times daily. 60 tablet 0   potassium chloride (KLOR-CON) 10 MEQ tablet TAKE 1 TABLET(10 MEQ) BY MOUTH TWICE DAILY 180 tablet 3   sacubitril-valsartan (ENTRESTO) 24-26 MG Take 1 tablet by mouth 2 (two) times daily. 180 tablet 2   sotalol (BETAPACE) 80 MG tablet Take by mouth daily. Take 1 1/2 tabs daily  (120mg )     No current facility-administered medications for this visit.    Allergies:   Aspirin, Sulfa antibiotics, and Other   Social History: Social History   Socioeconomic History   Marital status: Married    Spouse name: Not on file   Number of children: Not on file   Years of education: Not on file   Highest education level: Not on file  Occupational History   Not on file  Tobacco Use   Smoking status: Never   Smokeless tobacco: Never  Vaping Use   Vaping Use: Never used  Substance and Sexual Activity   Alcohol use: No   Drug use: No   Sexual activity: Not Currently  Other Topics Concern   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 Daughter, Beckie Busing, lives in [place]. Son, Lavon, lives in [place].   Social Determinants of Health   Financial Resource Strain: Not on file  Food Insecurity: Not on file  Transportation Needs: Not on file  Physical Activity: Not on file  Stress: Not on file  Social Connections: Not on file  Intimate Partner Violence: Not on file    Family History: Family History  Problem Relation Age of Onset   Hypertension Mother    Heart disease Mother    Diabetes Mother    Diabetes Father    Hypertension Father    Cancer Father    Healthy Sister    Heart attack Brother    Heart disease Brother 33       + tobacco   Healthy Brother     Review of Systems: All other systems reviewed and are otherwise negative except as noted above.   Physical Exam: Vitals:   12/06/20 0933  BP: 100/68  Pulse: 73  SpO2: 99%  Weight: 208 lb (94.3 kg)  Height: 5\' 6"  (1.676 m)     GEN- The patient is well appearing, alert and oriented x 3 today.   HEENT: normocephalic, atraumatic; sclera clear, conjunctiva pink; hearing intact; oropharynx clear; neck supple, no JVP Lymph- no cervical lymphadenopathy Lungs- Clear to ausculation bilaterally, normal work of breathing.  No wheezes, rales,  rhonchi Heart- Regular rate and rhythm, no murmurs, rubs or gallops, PMI not laterally displaced GI- soft, non-tender, non-distended, bowel sounds present, no hepatosplenomegaly Extremities- no clubbing or cyanosis. No edema; DP/PT/radial pulses 2+ bilaterally MS- no significant deformity or atrophy Skin- warm and dry, no rash or lesion; ICD pocket well healed Psych- euthymic mood, full affect Neuro- strength and sensation are intact  ICD interrogation- reviewed in detail today,  See PACEART report  EKG:  EKG is not ordered today.  Recent Labs: 11/30/2020: ALT 17 12/02/2020: BUN 16; Creatinine, Ser 1.17; Hemoglobin 8.3; Magnesium 1.8; Platelets 246; Potassium 3.9; Sodium 133   Wt Readings from Last 3 Encounters:  12/06/20 208 lb (94.3 kg)  12/02/20 210 lb 15.7 oz (95.7 kg)  09/09/20 201 lb (91.2 kg)     Other studies Reviewed: Additional studies/ records that were reviewed today include: Previous EP office notes.   Assessment and Plan:  1.  Chronic systolic dysfunction s/p St. Jude dual chamber  euvolemic today Stable on an appropriate medical regimen Normal ICD function See Pace Art report No changes today  2. Permanent atrial fibrillation Xarelto on hold with GI bleed and GI malignancy, like is a poor candidate for resumption despite CHA2DS2/VASc of at least 4 (probably higher but un-definable with malignancy) We discussed stroke and bleeding risk at length at today's visit. With shared decision making (and at GI recommendation in chart) have decided to remain off [place] until his GI malignancy is managed. He understands he is at a risk of stroke off of [place].  He has been on sotalol for years. This may be able to be stopped given now permanent AF. QTc 11/30/20 stable when corrected for QRS and in setting of having ICD.    3. HTN Stable on current regimen   4. Cardiac Clearance EF ~15% years ago. Update Despite that, By revised cardiac risk index [place] Hayward criteria) he is only at  a "moderate risk" of peri-operative cardiac complications.  Suspect this is "at least moderate" and more likely high risk given his co-morbidities. Given the nature of the malignancy, do not think his risk is prohibitive. He verbalizes understanding and wishes to move forward with any procedures that can treat his malignancy.  Will update echo ASAP to help further risk stratify.   Current medicines are reviewed at  length with the patient today.    Disposition:   Follow up with Dr. Lovena Le in 3 months    Signed, Shirley Friar, PA-C  12/06/2020 9:44 AM  [facility] [address] [place] [address] [phone] (office) [phone] (fax)

## 2020-12-06 ENCOUNTER — Ambulatory Visit: Payer: Medicare HMO | Admitting: Student

## 2020-12-06 ENCOUNTER — Other Ambulatory Visit: Payer: Self-pay

## 2020-12-06 ENCOUNTER — Encounter: Payer: Self-pay | Admitting: Student

## 2020-12-06 VITALS — BP 100/68 | HR 73 | Ht 66.0 in | Wt 208.0 lb

## 2020-12-06 DIAGNOSIS — I428 Other cardiomyopathies: Secondary | ICD-10-CM

## 2020-12-06 DIAGNOSIS — I4819 Other persistent atrial fibrillation: Secondary | ICD-10-CM

## 2020-12-06 DIAGNOSIS — I10 Essential (primary) hypertension: Secondary | ICD-10-CM

## 2020-12-06 DIAGNOSIS — Z95810 Presence of automatic (implantable) cardiac defibrillator: Secondary | ICD-10-CM | POA: Diagnosis not present

## 2020-12-06 LAB — CUP PACEART INCLINIC DEVICE CHECK
Battery Remaining Longevity: 34 mo
Brady Statistic RA Percent Paced: 0 %
Brady Statistic RV Percent Paced: 88 %
Date Time Interrogation Session: 20221025103045
HighPow Impedance: 55.125
Implantable Lead Implant Date: 20180413
Implantable Lead Implant Date: 20200828
Implantable Lead Location: 753859
Implantable Lead Location: 753860
Implantable Lead Model: 7122
Implantable Pulse Generator Implant Date: 20180413
Lead Channel Impedance Value: 375 Ohm
Lead Channel Impedance Value: 375 Ohm
Lead Channel Pacing Threshold Amplitude: 0.75 V
Lead Channel Pacing Threshold Amplitude: 0.75 V
Lead Channel Pacing Threshold Pulse Width: 0.5 ms
Lead Channel Pacing Threshold Pulse Width: 0.5 ms
Lead Channel Sensing Intrinsic Amplitude: 11.7 mV
Lead Channel Sensing Intrinsic Amplitude: 3.5 mV
Lead Channel Setting Pacing Amplitude: 2 V
Lead Channel Setting Pacing Amplitude: 2.5 V
Lead Channel Setting Pacing Pulse Width: 0.5 ms
Lead Channel Setting Sensing Sensitivity: 0.5 mV
Pulse Gen Serial Number: 7398151

## 2020-12-06 NOTE — Patient Instructions (Signed)
Medication Instructions:  Your physician recommends that you continue on your current medications as directed. Please refer to the Current Medication list given to you today.  *If you need a refill on your cardiac medications before your next appointment, please call your pharmacy*   Lab Work: None If you have labs (blood work) drawn today and your tests are completely normal, you will receive your results only by: [place] (if you have MyChart) OR A paper copy in the mail If you have any lab test that is abnormal or we need to change your treatment, we will call you to review the results.   Testing/Procedures: Your physician has requested that you have an echocardiogram. Echocardiography is a painless test that uses sound waves to create images of your heart. It provides your doctor with information about the size and shape of your heart and how well your heart's chambers and valves are working. This procedure takes approximately one hour. There are no restrictions for this procedure.   Follow-Up: At [facility], you and your health needs are our priority.  As part of our continuing mission to provide you with exceptional heart care, we have created designated Provider Care Teams.  These Care Teams include your primary Cardiologist (physician) and Advanced Practice Providers (APPs -  Physician Assistants and Nurse Practitioners) who all work together to provide you with the care you need, when you need it.  We recommend signing up for the patient portal called "MyChart".  Sign up information is provided on this After Visit Summary.  MyChart is used to connect with patients for Virtual Visits (Telemedicine).  Patients are able to view lab/test results, encounter notes, upcoming appointments, etc.  Non-urgent messages can be sent to your provider as well.   To learn more about what you can do with MyChart, go to [email or web address].    Your next appointment:   3 month(s)  The  format for your next appointment:   In Person  Provider:   Cristopher Peru, MD

## 2020-12-07 ENCOUNTER — Ambulatory Visit (HOSPITAL_COMMUNITY): Payer: Medicare HMO | Attending: Cardiology

## 2020-12-07 DIAGNOSIS — I428 Other cardiomyopathies: Secondary | ICD-10-CM

## 2020-12-07 DIAGNOSIS — I10 Essential (primary) hypertension: Secondary | ICD-10-CM | POA: Insufficient documentation

## 2020-12-07 DIAGNOSIS — I4819 Other persistent atrial fibrillation: Secondary | ICD-10-CM | POA: Diagnosis not present

## 2020-12-07 DIAGNOSIS — Z95810 Presence of automatic (implantable) cardiac defibrillator: Secondary | ICD-10-CM | POA: Insufficient documentation

## 2020-12-07 LAB — ECHOCARDIOGRAM COMPLETE
Area-P 1/2: 3.91 cm2
MV M vel: 4.5 m/s
MV Peak grad: 80.8 mmHg
MV VTI: 1.65 cm2
Radius: 0.9 cm
S' Lateral: 5.8 cm

## 2020-12-08 ENCOUNTER — Inpatient Hospital Stay: Payer: Medicare HMO | Attending: Hematology | Admitting: Hematology

## 2020-12-08 ENCOUNTER — Other Ambulatory Visit: Payer: Self-pay

## 2020-12-08 ENCOUNTER — Encounter: Payer: Self-pay | Admitting: Hematology

## 2020-12-08 DIAGNOSIS — Z8546 Personal history of malignant neoplasm of prostate: Secondary | ICD-10-CM | POA: Diagnosis not present

## 2020-12-08 DIAGNOSIS — I2510 Atherosclerotic heart disease of native coronary artery without angina pectoris: Secondary | ICD-10-CM | POA: Diagnosis not present

## 2020-12-08 DIAGNOSIS — I4891 Unspecified atrial fibrillation: Secondary | ICD-10-CM

## 2020-12-08 DIAGNOSIS — N400 Enlarged prostate without lower urinary tract symptoms: Secondary | ICD-10-CM | POA: Diagnosis not present

## 2020-12-08 DIAGNOSIS — K2950 Unspecified chronic gastritis without bleeding: Secondary | ICD-10-CM | POA: Diagnosis not present

## 2020-12-08 DIAGNOSIS — C163 Malignant neoplasm of pyloric antrum: Secondary | ICD-10-CM

## 2020-12-08 DIAGNOSIS — E7800 Pure hypercholesterolemia, unspecified: Secondary | ICD-10-CM | POA: Diagnosis not present

## 2020-12-08 DIAGNOSIS — I429 Cardiomyopathy, unspecified: Secondary | ICD-10-CM

## 2020-12-08 DIAGNOSIS — C61 Malignant neoplasm of prostate: Secondary | ICD-10-CM | POA: Diagnosis not present

## 2020-12-08 DIAGNOSIS — E119 Type 2 diabetes mellitus without complications: Secondary | ICD-10-CM | POA: Diagnosis not present

## 2020-12-08 DIAGNOSIS — D500 Iron deficiency anemia secondary to blood loss (chronic): Secondary | ICD-10-CM | POA: Diagnosis not present

## 2020-12-08 DIAGNOSIS — G8929 Other chronic pain: Secondary | ICD-10-CM | POA: Diagnosis not present

## 2020-12-08 DIAGNOSIS — C169 Malignant neoplasm of stomach, unspecified: Secondary | ICD-10-CM | POA: Insufficient documentation

## 2020-12-08 DIAGNOSIS — D509 Iron deficiency anemia, unspecified: Secondary | ICD-10-CM | POA: Diagnosis not present

## 2020-12-08 DIAGNOSIS — E1169 Type 2 diabetes mellitus with other specified complication: Secondary | ICD-10-CM | POA: Diagnosis not present

## 2020-12-08 NOTE — Progress Notes (Addendum)
[place]   [phone] Fax:([phone] [place] Note   Patient Care Team: [name], MD as PCP - General (Internal Medicine) [name], [name], MD as PCP - Cardiology (Cardiology) [name], MD as PCP - Electrophysiology (Cardiology) [name], MD as Consulting Physician (General Surgery) Truitt Merle, MD as Consulting Physician (Hematology)  Date of Service:  12/08/2020   CHIEF COMPLAINTS/PURPOSE OF CONSULTATION:  Gastric Cancer  REFERRING PHYSICIAN:  Dr. [name]  ASSESSMENT & PLAN:  Jon Williams is a 70 y.o.  male with a history of severe cardiomyopathy, HTN, DM, AF   1. Gastric Cancer, cTxN0M0 -initially referred to ED by PCP on 11/30/20 for low hemoglobin of 7.4 on routine lab work. He was also recently treated for H-pylori gastritis. EGD on 12/01/20 by Dr. Benson Norway showed a tumor at the incisura, which pathology confirmed was adenocarcinoma.  -CT CAP 12/01/20 showed no metastatic disease. -He met with Dr. Zenia Resides on 12/05/20, who recommends EUS to determine staging. Will reach out to Dr. Benson Norway for his EUS appointment.  -He has also been evaluated by his cardiologist, who does not feel his risk is prohibitive for surgery, and will undergo echo ASAP. -if he has T2 or high, or positive lymph nodes on [place], I recommended neoadjuvant chemotherapy.  Also he has severe cardiomyopathy, he does have decent performance status, I think he is a candidate for FLOT4 with some dose reduction, if he does not tolerate, will change to FOLFOX.  2. H/o Prostate Cancer  -diagnosed with Gleason 4+4 prostate cancer in early 2019. He was treated with long-term ADT in combination with 8 weeks of IMRT.  3.  Diabetes, atrial fibrillation, severe cardiomyopathy with EF 20 to 25%, status post ICD placement -Follow-up with PCP and cardiology -We will carefully monitor his heart function during chemotherapy  4.  Iron deficient anemia -Secondary  to gastric cancer -We will give IV Venofer 400 mg weeklyx3.  Potential side effects, including anaphylactic reaction, discussed with patient, he agrees to proceed.  PLAN:  -referral back to Dr. Benson Norway for EUS -f/u after EUS to finalize his treatment plan. -will check HER2, MMR and PD-L1 on his biopsy  -iv Venofer weeklyX3   Oncology History  Gastric cancer ([place])  12/01/2020 Procedure   Upper Endoscopy, Dr. Benson Norway  Impression: - Normal esophagus. 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 Biopsied. - Normal examined duodenum.   12/01/2020 Pathology Results   FINAL MICROSCOPIC DIAGNOSIS:   A. INCISURA, BIOPSY:  - Adenocarcinoma, moderate to poorly differentiated arising in a  background of chronic gastritis with intestinal metaplasia.    12/01/2020 Imaging   CT CAP  IMPRESSION: Focal wall thickening along the posterior aspect of the gastric antrum, likely corresponding to the patient's newly diagnosed gastric cancer.   No findings suspicious for metastatic disease.   Mild multifocal pneumonia, lower lobe predominant, likely on the basis of aspiration. Trace right pleural effusion.   Fiducial markers along the prostate in this patient with known prostate cancer.   12/08/2020 Initial Diagnosis   Gastric cancer (HCC)      HISTORY OF PRESENTING ILLNESS:  Jon Williams 70 y.o. male is a here because of gastric cancer. The patient was referred by Dr. Benson Norway. The patient presents to the clinic today accompanied by his wife.   He was initially evaluated by his PCP in 10/2020 for acid reflux-like epigastric pain. He tested positive for H-pylori at that time. He was later  referred to the ED on 11/30/20 by his PCP for hemoglobin of 7.4. While there, his hgb was 7.1 and his fecal occult blood test was positive.  He underwent EDG on 10/20 by Dr. Benson Norway showing a suspicious ulcerated mass at the gastric incisura. Pathology from the procedure revealed adenocarcinoma, moderate to  poorly differentiated arising in a background of chronic gastritis with intestinal metaplasia.   Today the patient notes they felt/feeling prior/after... -his wife reports he is less active over the last few years.   He has a PMHx of.... -prostate cancer, s/p ADT and IMRT -CHF, unsure of cause -he denies DM, despite it being in his chart and being on medication   Socially... -he is a retired Glass blower/designer. He is married with two children. -father with lung cancer (nonsmoker). No other family history of cancer to his knowledge.   REVIEW OF SYSTEMS:    Constitutional: Denies fevers, chills or abnormal night sweats Eyes: Denies blurriness of vision, double vision or watery eyes Ears, nose, mouth, throat, and face: Denies mucositis or sore throat Respiratory: Denies cough, dyspnea or wheezes Cardiovascular: Denies palpitation, chest discomfort or lower extremity swelling Gastrointestinal:  Denies nausea, heartburn or change in bowel habits Skin: Denies abnormal skin rashes Lymphatics: Denies new lymphadenopathy or easy bruising Neurological:Denies numbness, tingling or new weaknesses Behavioral/Psych: Mood is stable, no new changes  All other systems were reviewed with the patient and are negative.   MEDICAL HISTORY:  Past Medical History:  Diagnosis Date   AICD (automatic cardioverter/defibrillator) present 05/25/2016   biv icd   Bunion, right    Chronic combined systolic and diastolic CHF (congestive heart failure) ([place])    Echo 1/18: Mild conc LVH, EF 15-20, severe diff HK, inf and inf-septal AK, Gr 3 DD, mild to mod MR, severe LAE, mod reduced RVSF, mod RAE, mild TR, PASP 50   Coronary artery disease involving native coronary artery without angina pectoris 04/17/2016   LHC 1/18: pLCx 40, mLCx 20, mRCA 40, dRCA 20, LVEDP 23, mean RA 8, PA 42/20, PCWP 17   Diabetes mellitus without complication (HCC)    DJD (degenerative joint disease)    History of atrial fibrillation     History of atrial flutter    History of cardiomegaly 06/07/2016   Noted on CXR   History of colon polyps 06/28/2017   Noted on colonoscopy   LBBB (left bundle branch block)    NICM (nonischemic cardiomyopathy) ([place])    Echo 1/18:  Mild conc LVH, EF 15-20, severe diff HK, inf and inf-septal AK, Gr 3 DD, mild to mod MR, severe LAE, mod reduced RVSF, mod RAE, mild TR, PASP 50   OA (osteoarthritis)    knee   Other secondary pulmonary hypertension ([place]) 04/17/2016   Prostate cancer ([place]) 2019   Sigmoid diverticulosis 06/28/2017   Noted on colonoscopy    SURGICAL HISTORY: Past Surgical History:  Procedure Laterality Date   BIOPSY  12/01/2020   Procedure: BIOPSY;  Surgeon: [name], MD;  Location: [facility];  Service: Endoscopy;;   BIV ICD INSERTION CRT-D N/A 05/25/2016   Procedure: BiV ICD Insertion CRT-D;  Surgeon: [name], MD;  Location[facility];  Service: Cardiovascular;  Laterality: N/A;   CARDIAC CATHETERIZATION N/A 03/02/2016   Procedure: Right/Left Heart Cath and Coronary Angiography;  Surgeon: Nelva Bush, MD;  Location: [facility];  Service: Cardiovascular;  Laterality: N/A;   CARDIOVERSION N/A 07/17/2016   Procedure: Cardioversion;  Surgeon: [name],  MD;  Location: [place] [facility];  Service: Cardiovascular;  Laterality: N/A;   COLONOSCOPY WITH PROPOFOL N/A 06/28/2017   Procedure: COLONOSCOPY WITH PROPOFOL;  Surgeon: [name], MD;  [facility];  Service: Endoscopy;  Laterality: N/A;   colonscopy  2009   ESOPHAGOGASTRODUODENOSCOPY N/A 12/01/2020   Procedure: ESOPHAGOGASTRODUODENOSCOPY (EGD);  Surgeon: [name], MD;  Location: [facility];  Service: Endoscopy;  Laterality: N/A;  IDA/guaiac positive stools   GOLD SEED IMPLANT N/A 12/24/2017   Procedure: GOLD SEED IMPLANT, TRANSERINEAL;  Surgeon: Festus Aloe, MD;  Location: [facility];  Service: Urology;  Laterality: N/A;   INSERT / REPLACE / REMOVE PACEMAKER     LEAD  REVISION  10/10/2018   LEAD REVISION/REPAIR N/A 10/10/2018   Procedure: LEAD REVISION/REPAIR;  Surgeon: [name], MD;  Location: [facility] [facility];  Service: Cardiovascular;  Laterality: N/A;   POLYPECTOMY  06/28/2017   Procedure: POLYPECTOMY;  Surgeon: [name], MD;  [facility];  Service: Endoscopy;;  ascending and descending colon polyp   PROSTATE BIOPSY  02/20/2017   SPACE OAR INSTILLATION N/A 12/24/2017   Procedure: SPACE OAR INSTILLATION;  Surgeon: Festus Aloe, MD;  [facility];  Service: Urology;  Laterality: N/A;   TOTAL KNEE ARTHROPLASTY Right 08/16/2017   Procedure: RIGHT TOTAL KNEE ARTHROPLASTY;  Surgeon: [name], MD;  Location: [facility];  Service: Orthopedics;  Laterality: Right;    SOCIAL HISTORY: Social History   Socioeconomic History   Marital status: Married    Spouse name: Not on file   Number of children: 2   Years of education: Not on file   Highest education level: Not on file  Occupational History   Not on file  Tobacco Use   Smoking status: Never   Smokeless tobacco: Never  Vaping Use   Vaping Use: Never used  Substance and Sexual Activity   Alcohol use: No   Drug use: No   Sexual activity: Not Currently  Other Topics Concern   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 Daughter, Beckie Busing, lives in [place]. Son, Spencer, lives in [place].   Social Determinants of Health   Financial Resource Strain: Not on file  Food Insecurity: Not on file  Transportation Needs: Not on file  Physical Activity: Not on file  Stress: Not on file  Social Connections: Not on file  Intimate Partner Violence: Not on file    FAMILY HISTORY: Family History  Problem Relation Age of Onset   Hypertension Mother    Heart disease Mother    Diabetes Mother    Diabetes Father    Hypertension Father    Cancer Father        lung cancer   Healthy Sister    Heart attack Brother     Heart disease Brother 65       + tobacco   Healthy Brother     ALLERGIES:  is allergic to aspirin, sulfa antibiotics, and other.  MEDICATIONS:  Current Outpatient Medications  Medication Sig Dispense Refill   atorvastatin (LIPITOR) 40 MG tablet TAKE 1 TABLET(40 MG) BY MOUTH DAILY 90 tablet 0   carvedilol (COREG) 3.125 MG tablet TAKE 1 TABLET(3.125 MG) BY MOUTH TWICE DAILY 180 tablet 0   cholecalciferol (VITAMIN D3) 25 MCG (1000 UT) tablet Take 1,000 Units by mouth daily with lunch.      furosemide (LASIX) 40 MG tablet Take 1 tablet (40 mg total) by mouth 2 (  two) times daily. 180 tablet 3   metFORMIN (GLUCOPHAGE-XR) 500 MG 24 hr tablet Take 500 mg by mouth every evening.     Multiple Vitamin (MULTIVITAMIN WITH MINERALS) TABS tablet Take 1 tablet by mouth daily with lunch. One-A-Day     Naphazoline HCl (CLEAR EYES OP) Place 1 drop into both eyes daily as needed (irritation).     ondansetron (ZOFRAN) 4 MG tablet Take 1 tablet (4 mg total) by mouth every 6 (six) hours as needed for nausea. 20 tablet 0   pantoprazole (PROTONIX) 40 MG tablet Take 1 tablet (40 mg total) by mouth 2 (two) times daily. 60 tablet 0   potassium chloride (KLOR-CON) 10 MEQ tablet TAKE 1 TABLET(10 MEQ) BY MOUTH TWICE DAILY 180 tablet 3   sacubitril-valsartan (ENTRESTO) 24-26 MG Take 1 tablet by mouth 2 (two) times daily. 180 tablet 2   sotalol (BETAPACE) 80 MG tablet Take by mouth daily. Take 1 1/2 tabs daily (130m)     No current facility-administered medications for this visit.    PHYSICAL EXAMINATION: ECOG PERFORMANCE STATUS: 1 - Symptomatic but completely ambulatory  Vitals:   12/08/20 1514  BP: 124/74  Pulse: 82  Resp: 18  Temp: 98.2 F (36.8 C)  SpO2: 100%   Filed Weights   12/08/20 1514  Weight: 100.6 kg    GENERAL:alert, no distress and comfortable SKIN: skin color, texture, turgor are normal, no rashes or significant lesions EYES: normal, Conjunctiva are pink and non-injected, sclera clear   NECK: supple, thyroid normal size, non-tender, without nodularity LYMPH:  no palpable lymphadenopathy in the cervical, axillary  LUNGS: clear to auscultation and percussion with normal breathing effort HEART: regular rate & rhythm and no murmurs and no lower extremity edema ABDOMEN:abdomen soft, non-tender and normal bowel sounds Musculoskeletal:no cyanosis of digits and no clubbing  NEURO: alert & oriented x 3 with fluent speech, no focal motor/sensory deficits  LABORATORY DATA:  I have reviewed the data as listed CBC Latest Ref Rng & Units 12/02/2020 12/01/2020 11/30/2020  WBC 4.0 - 10.5 K/uL 8.3 6.4 5.8  Hemoglobin 13.0 - 17.0 g/dL 8.3(L) 7.2(L) 7.1(L)  Hematocrit 39.0 - 52.0 % 27.0(L) 24.2(L) 24.3(L)  Platelets 150 - 400 K/uL 246 253 288    CMP Latest Ref Rng & Units 12/02/2020 12/01/2020 11/30/2020  Glucose 70 - 99 mg/dL 110(H) 96 145(H)  BUN 8 - 23 mg/dL _0 Creatinine 0.61 - 1.24 mg/dL 1.17 1.05 1.15  Sodium 135 - 145 mmol/L 133(L) 136 139  Potassium 3.5 - 5.1 mmol/L 3.9 3.8 3.3(L)  Chloride 98 - 111 mmol/L 103 107 107  CO2 22 - 32 mmol/L _1 Calcium 8.9 - 10.3 mg/dL 8.7(L) 8.7(L) 9.1  Total Protein 6.5 - 8.1 g/dL - - 6.9  Total Bilirubin 0.3 - 1.2 mg/dL - - 0.6  Alkaline Phos 38 - 126 U/L - - 52  AST 15 - 41 U/L - - 19  ALT 0 - 44 U/L - - 17     RADIOGRAPHIC STUDIES: I have personally reviewed the radiological images as listed and agreed with the findings in the report. CT CHEST ABDOMEN PELVIS W CONTRAST  Result Date: 12/02/2020 CLINICAL DATA:  Gastric cancer, for staging EXAM: CT CHEST, ABDOMEN, AND PELVIS WITH CONTRAST TECHNIQUE: Multidetector CT imaging of the chest, abdomen and pelvis was performed following the standard protocol during bolus administration of intravenous contrast. CONTRAST:  77mOMNIPAQUE IOHEXOL 350 MG/ML SOLN COMPARISON:  None. FINDINGS: CT CHEST FINDINGS Cardiovascular:  Cardiomegaly.  No pericardial effusion. No evidence of  thoracic aortic aneurysm. Three vessel coronary atherosclerosis. Mediastinum/Nodes: No suspicious mediastinal lymphadenopathy. Visualized thyroid is unremarkable. Lungs/Pleura: Mild patchy/ground-glass opacities in the posterior right upper and right middle lobes. Mild patchy/ground-glass opacities in the bilateral lower lobes. This appearance favors multifocal pneumonia, likely on the basis of aspiration. No suspicious pulmonary nodules. Trace right pleural fluid (series 3/image 59).  No pneumothorax. Musculoskeletal: Visualized osseous structures are within normal limits. CT ABDOMEN PELVIS FINDINGS Motion degraded images. Hepatobiliary: Liver is within normal limits. Gallbladder is unremarkable. No intrahepatic or extrahepatic ductal dilatation. Pancreas: Within normal limits. Spleen: Within normal limits. Adrenals/Urinary Tract: 13 mm left adrenal nodule (series 3/image 69). Right adrenal gland is within normal limits. Kidneys are within normal limits.  No hydronephrosis. Bladder is mildly thick-walled but underdistended. Stomach/Bowel: Focal wall thickening along the posterior aspect of the gastric antrum (series 3/image 35), likely corresponding to the patient's newly diagnosed gastric cancer. No evidence of bowel obstruction. Normal appendix (series 3/image 92). Left colon is decompressed. Vascular/Lymphatic: No evidence of abdominal aortic aneurysm. Atherosclerotic calcifications of the abdominal aorta and branch vessels. No suspicious abdominopelvic lymphadenopathy. Specifically, no gastrohepatic or celiac axis nodes. Reproductive: Prostate is notable for fiducial markers. Other: No abdominopelvic ascites. Small fat containing periumbilical hernia. Musculoskeletal: Degenerative changes of the lumbar spine. Grade 2 spondylolisthesis of L4 on L5. IMPRESSION: Focal wall thickening along the posterior aspect of the gastric antrum, likely corresponding to the patient's newly diagnosed gastric cancer. No findings  suspicious for metastatic disease. Mild multifocal pneumonia, lower lobe predominant, likely on the basis of aspiration. Trace right pleural effusion. Fiducial markers along the prostate in this patient with known prostate cancer. Electronically Signed   By: Julian Hy M.D.   On: 12/02/2020 01:11   ECHOCARDIOGRAM COMPLETE  Result Date: 12/07/2020    ECHOCARDIOGRAM REPORT   Patient Name:   Jon Williams Date of Exam: 12/07/2020 Medical Rec #:  [id number]        Height:       66.0 in Accession #:    [id number]       Weight:       208.0 lb Date of Birth:  04/10/1950         BSA:          2.033 m Patient Age:    75 years         BP:           100/68 mmHg Patient Gender: M                HR:           91 bpm. Exam Location:  [facility] Procedure: 2D Echo, Cardiac Doppler, Color Doppler and Strain Analysis Indications:    R55 Syncope  History:        Patient has prior history of Echocardiogram examinations, most                 recent 05/02/2016. CHF and Cardiomyopathy, CAD, Pacemaker and                 Defibrillator, Arrythmias:Atrial Fibrillation, Atrial Flutter                 and LBBB, Signs/Symptoms:Dizziness/Lightheadedness; Risk                 Factors:Hypertension, Diabetes and Family History of Coronary                 Artery  Disease.  Sonographer:    Deliah Boston RDCS Referring Phys: Shirley Friar IMPRESSIONS  1. There is a false tendon in the mid LV cavity of no clinical significance. . Left ventricular ejection fraction, by estimation, is 20 to 25%. The left ventricle has severely decreased function. The left ventricle demonstrates global hypokinesis. The left ventricular internal cavity size was severely dilated. Left ventricular diastolic function could not be evaluated. The average left ventricular global longitudinal strain is 13.9 %. The global longitudinal strain is abnormal.  2. Right ventricular systolic function is normal. The right ventricular size is moderately enlarged.  There is mildly elevated pulmonary artery systolic pressure. The estimated right ventricular systolic pressure is 62.7 mmHg.  3. Left atrial size was severely dilated.  4. Right atrial size was severely dilated.  5. The mitral valve is degenerative. Moderate to severe mitral valve regurgitation. Moderate mitral stenosis. The mean mitral valve gradient is 1.0 mmHg. PISA ERO 0.34cm2, RV 53cc.  6. Tricuspid valve regurgitation is moderate.  7. The aortic valve is tricuspid. Aortic valve regurgitation is not visualized. Mild aortic valve sclerosis is present, with no evidence of aortic valve stenosis.  8. The inferior vena cava is normal in size with greater than 50% respiratory variability, suggesting right atrial pressure of 3 mmHg.  9. Side by side comparison from study 2018 demonstrates persistence of severe LV dysfunction. Mitral and tricuspid regurgitation have worsened. Consider TEE for further evaulation of MR if clinically indicated. FINDINGS  Left Ventricle: There is a false tendon in the mid LV cavity of no clinical significance. Left ventricular ejection fraction, by estimation, is 20 to 25%. The left ventricle has severely decreased function. The left ventricle demonstrates global hypokinesis. The average left ventricular global longitudinal strain is 13.9 %. The global longitudinal strain is abnormal. The left ventricular internal cavity size was severely dilated. There is no left ventricular hypertrophy. Abnormal (paradoxical) septal motion, consistent with left bundle branch block. Left ventricular diastolic function could not be evaluated. Right Ventricle: The right ventricular size is moderately enlarged. No increase in right ventricular wall thickness. Right ventricular systolic function is normal. There is mildly elevated pulmonary artery systolic pressure. The tricuspid regurgitant velocity is 2.66 m/s, and with an assumed right atrial pressure of 15 mmHg, the estimated right ventricular systolic  pressure is 03.5 mmHg. Left Atrium: Left atrial size was severely dilated. Right Atrium: Right atrial size was severely dilated. Pericardium: There is no evidence of pericardial effusion. Mitral Valve: The mitral valve is degenerative in appearance. Moderate to severe mitral valve regurgitation. Moderate mitral valve stenosis. MV peak gradient, 4.0 mmHg. The mean mitral valve gradient is 1.0 mmHg. Tricuspid Valve: The tricuspid valve is normal in structure. Tricuspid valve regurgitation is moderate . No evidence of tricuspid stenosis. Aortic Valve: The aortic valve is tricuspid. Aortic valve regurgitation is not visualized. Mild aortic valve sclerosis is present, with no evidence of aortic valve stenosis. Pulmonic Valve: The pulmonic valve was normal in structure. Pulmonic valve regurgitation is mild to moderate. No evidence of pulmonic stenosis. Aorta: The aortic root is normal in size and structure. Venous: The inferior vena cava is normal in size with greater than 50% respiratory variability, suggesting right atrial pressure of 3 mmHg. IAS/Shunts: No atrial level shunt detected by color flow Doppler. Additional Comments: A device lead is visualized.  LEFT VENTRICLE PLAX 2D LVIDd:         7.00 cm   Diastology LVIDs:  5.80 cm   LV e' medial:    7.62 cm/s LV PW:         1.00 cm   LV E/e' medial:  13.6 LV IVS:        1.00 cm   LV e' lateral:   11.90 cm/s LVOT diam:     2.30 cm   LV E/e' lateral: 8.7 LV SV:         50 LV SV Index:   24        2D Longitudinal Strain LVOT Area:     4.15 cm  2D Strain GLS (A2C):   19.8 %                          2D Strain GLS (A3C):   10.8 %                          2D Strain GLS (A4C):   11.1 %                          2D Strain GLS Avg:     13.9 % RIGHT VENTRICLE RV S prime:     13.10 cm/s TAPSE (M-mode): 2.2 cm LEFT ATRIUM              Index        RIGHT ATRIUM           Index LA diam:        5.20 cm  2.56 cm/m   RA Area:     42.00 cm LA Vol (A2C):   107.0 ml 52.62 ml/m  RA  Volume:   201.00 ml 98.85 ml/m LA Vol (A4C):   115.0 ml 56.56 ml/m LA Biplane Vol: 112.0 ml 55.08 ml/m  AORTIC VALVE LVOT Vmax:   61.85 cm/s LVOT Vmean:  38.225 cm/s LVOT VTI:    0.120 m  AORTA Ao Root diam: 3.50 cm Ao Asc diam:  3.30 cm MITRAL VALVE                  TRICUSPID VALVE MV Area (PHT): cm            TR Peak grad:   28.3 mmHg MV Area VTI:   1.65 cm       TR Vmax:        266.00 cm/s MV Peak grad:  4.0 mmHg MV Mean grad:  1.0 mmHg       SHUNTS MV Vmax:       1.00 m/s       Systemic VTI:  0.12 m MV Vmean:      51.2 cm/s      Systemic Diam: 2.30 cm MV Decel Time: 194 msec MR Peak grad:    80.8 mmHg MR Mean grad:    47.5 mmHg MR Vmax:         449.50 cm/s MR Vmean:        315.0 cm/s MR PISA:         5.09 cm MR PISA Eff ROA: 34 mm MR PISA Radius:  0.90 cm MV E velocity: 104.00 cm/s Fransico Him MD Electronically signed by Fransico Him MD Signature Date/Time: 12/07/2020/1:53:43 PM    Final    CUP PACEART INCLINIC DEVICE CHECK  Result Date: 12/06/2020 ICD check in clinic. Normal device function. Thresholds and sensing consistent with previous device measurements. Impedance trends stable over  time. No mode switches. No ventricular arrhythmias. Histogram distribution appropriate for patient and level of  activity. No changes made this session. Device programmed at appropriate safety margins. Estimated longevity 2 yr, 10 mo. Pt enrolled in remote follow-up. Patient education completed    Orders Placed This Encounter  Procedures   CBC with Differential/Platelet    Standing Status:   Standing    Number of Occurrences:   50    Standing Expiration Date:   12/08/2021   Comprehensive metabolic panel    Standing Status:   Standing    Number of Occurrences:   50    Standing Expiration Date:   12/08/2021   CEA (IN HOUSE-[facility])    Standing Status:   Standing    Number of Occurrences:   5    Standing Expiration Date:   12/08/2021   Ferritin    Standing Status:   Standing    Number of Occurrences:    20    Standing Expiration Date:   12/08/2021   Iron and TIBC    Standing Status:   Standing    Number of Occurrences:   20    Standing Expiration Date:   12/08/2021     All questions were answered. The patient knows to call the clinic with any problems, questions or concerns. The total time spent in the appointment was 50 minutes.     Truitt Merle, MD 12/08/2020 11:24 PM  I, Wilburn Mylar, am acting as scribe for Truitt Merle, MD.   I have reviewed the above documentation for accuracy and completeness, and I agree with the above.

## 2020-12-08 NOTE — Progress Notes (Signed)
I met with Mr and Mrs Heffner before his consultation with Dr Burr Medico.  I reviewed my role as GI Oncology Nurse navigator and provided my contact information.  I reviewed services provided by [facility].  All questions were answered.  They verbalized understanding

## 2020-12-12 ENCOUNTER — Other Ambulatory Visit: Payer: Self-pay | Admitting: Hematology

## 2020-12-12 DIAGNOSIS — D509 Iron deficiency anemia, unspecified: Secondary | ICD-10-CM | POA: Insufficient documentation

## 2020-12-12 DIAGNOSIS — D500 Iron deficiency anemia secondary to blood loss (chronic): Secondary | ICD-10-CM

## 2020-12-13 ENCOUNTER — Inpatient Hospital Stay: Payer: Medicare HMO | Attending: Hematology

## 2020-12-13 ENCOUNTER — Other Ambulatory Visit: Payer: Self-pay | Admitting: Gastroenterology

## 2020-12-13 ENCOUNTER — Telehealth: Payer: Self-pay | Admitting: *Deleted

## 2020-12-13 ENCOUNTER — Other Ambulatory Visit: Payer: Self-pay

## 2020-12-13 VITALS — BP 113/66 | HR 63 | Temp 97.7°F | Resp 17

## 2020-12-13 DIAGNOSIS — Z5111 Encounter for antineoplastic chemotherapy: Secondary | ICD-10-CM | POA: Insufficient documentation

## 2020-12-13 DIAGNOSIS — D508 Other iron deficiency anemias: Secondary | ICD-10-CM | POA: Diagnosis not present

## 2020-12-13 DIAGNOSIS — C169 Malignant neoplasm of stomach, unspecified: Secondary | ICD-10-CM | POA: Diagnosis not present

## 2020-12-13 DIAGNOSIS — R197 Diarrhea, unspecified: Secondary | ICD-10-CM | POA: Insufficient documentation

## 2020-12-13 DIAGNOSIS — Z5189 Encounter for other specified aftercare: Secondary | ICD-10-CM | POA: Diagnosis not present

## 2020-12-13 DIAGNOSIS — D500 Iron deficiency anemia secondary to blood loss (chronic): Secondary | ICD-10-CM

## 2020-12-13 MED ORDER — SODIUM CHLORIDE 0.9 % IV SOLN: Freq: Once | INTRAVENOUS | Status: AC

## 2020-12-13 MED ORDER — LORATADINE 10 MG PO TABS
10.0000 mg | ORAL_TABLET | Freq: Once | ORAL | Status: AC
  Administered 2020-12-13: 10 mg via ORAL
  Filled 2020-12-13: qty 1

## 2020-12-13 MED ORDER — SODIUM CHLORIDE 0.9 % IV SOLN
400.0000 mg | Freq: Once | INTRAVENOUS | Status: AC
  Administered 2020-12-13: 400 mg via INTRAVENOUS
  Filled 2020-12-13: qty 20

## 2020-12-13 NOTE — Telephone Encounter (Signed)
   Pre-operative Risk Assessment    Patient Name: Jon Williams  DOB: [birth year] MRN: [id number]      Request for Surgical Clearance   Procedure:   OPEN PARTIAL GASTRECTOMY   Date of Surgery: Clearance TBD                                 Surgeon:  DR. Michaelle Birks Surgeon's Group or Practice Name:  [facility] Phone number:  [phone] Fax number:  [phone] ATTN: Flint Melter, CMA   Type of Clearance Requested: - Medical    Type of Anesthesia:   General    Additional requests/questions:   Jiles Prows   12/13/2020, 10:00 AM

## 2020-12-13 NOTE — Progress Notes (Signed)
I spoke with Jon Williams in the infusion room.  HE is agreeable to meet with Dr Burr Medico Tuesday 12/20/2020 at 1300 to discuss EUS results.

## 2020-12-13 NOTE — Patient Instructions (Signed)

## 2020-12-13 NOTE — Telephone Encounter (Signed)
I have faxed Jon Williams's note dated 12/06/20 that contains clearance.

## 2020-12-14 ENCOUNTER — Other Ambulatory Visit: Payer: Self-pay

## 2020-12-14 DIAGNOSIS — D500 Iron deficiency anemia secondary to blood loss (chronic): Secondary | ICD-10-CM | POA: Diagnosis not present

## 2020-12-14 DIAGNOSIS — C169 Malignant neoplasm of stomach, unspecified: Secondary | ICD-10-CM | POA: Diagnosis not present

## 2020-12-14 DIAGNOSIS — I428 Other cardiomyopathies: Secondary | ICD-10-CM | POA: Diagnosis not present

## 2020-12-14 NOTE — Progress Notes (Signed)
The proposed treatment discussed in conference is for discussion purpose only and is not a binding recommendation.  The patients have not been physically examined, or presented with their treatment options.  Therefore, final treatment plans cannot be decided.  

## 2020-12-15 ENCOUNTER — Telehealth: Payer: Self-pay

## 2020-12-15 NOTE — Telephone Encounter (Signed)
**Note De-Identified Jera Headings Obfuscation** The pts completed [employer] Pt Asst application for [name] was left at the office with documents.  I have completed the providers page of his application and have emailed all to Dr Forde Dandy nurse so she can print a Ship broker prescription, obtain Dr Forde Dandy signature on the application and the prescription, date it, and to fax all to [employer] at the fax number written on the cover letter included or to place in the to be faxed basket in Medical Records to be faxed.

## 2020-12-16 ENCOUNTER — Encounter (HOSPITAL_COMMUNITY)
Admission: RE | Disposition: A | Discharge status: Home / Self Care | Payer: Self-pay | Source: Home / Self Care | Attending: Gastroenterology

## 2020-12-16 ENCOUNTER — Ambulatory Visit (HOSPITAL_COMMUNITY): Payer: Medicare HMO | Admitting: Registered Nurse

## 2020-12-16 ENCOUNTER — Encounter (HOSPITAL_COMMUNITY): Payer: Self-pay | Admitting: Gastroenterology

## 2020-12-16 ENCOUNTER — Ambulatory Visit (HOSPITAL_COMMUNITY)
Admission: RE | Admit: 2020-12-16 | Discharge: 2020-12-16 | Disposition: A | Discharge status: Home / Self Care | Payer: Medicare HMO | Attending: Gastroenterology | Admitting: Gastroenterology

## 2020-12-16 ENCOUNTER — Other Ambulatory Visit: Payer: Self-pay

## 2020-12-16 DIAGNOSIS — Z8546 Personal history of malignant neoplasm of prostate: Secondary | ICD-10-CM | POA: Insufficient documentation

## 2020-12-16 DIAGNOSIS — D500 Iron deficiency anemia secondary to blood loss (chronic): Secondary | ICD-10-CM | POA: Diagnosis not present

## 2020-12-16 DIAGNOSIS — K3189 Other diseases of stomach and duodenum: Secondary | ICD-10-CM | POA: Diagnosis not present

## 2020-12-16 DIAGNOSIS — E119 Type 2 diabetes mellitus without complications: Secondary | ICD-10-CM | POA: Diagnosis not present

## 2020-12-16 DIAGNOSIS — I5042 Chronic combined systolic (congestive) and diastolic (congestive) heart failure: Secondary | ICD-10-CM | POA: Diagnosis not present

## 2020-12-16 DIAGNOSIS — I4819 Other persistent atrial fibrillation: Secondary | ICD-10-CM | POA: Diagnosis not present

## 2020-12-16 DIAGNOSIS — Z95810 Presence of automatic (implantable) cardiac defibrillator: Secondary | ICD-10-CM | POA: Insufficient documentation

## 2020-12-16 DIAGNOSIS — M1990 Unspecified osteoarthritis, unspecified site: Secondary | ICD-10-CM | POA: Insufficient documentation

## 2020-12-16 DIAGNOSIS — C169 Malignant neoplasm of stomach, unspecified: Secondary | ICD-10-CM | POA: Diagnosis not present

## 2020-12-16 DIAGNOSIS — I4891 Unspecified atrial fibrillation: Secondary | ICD-10-CM | POA: Diagnosis not present

## 2020-12-16 DIAGNOSIS — I2510 Atherosclerotic heart disease of native coronary artery without angina pectoris: Secondary | ICD-10-CM | POA: Insufficient documentation

## 2020-12-16 HISTORY — PX: ESOPHAGOGASTRODUODENOSCOPY (EGD) WITH PROPOFOL: SHX5813

## 2020-12-16 HISTORY — PX: UPPER ESOPHAGEAL ENDOSCOPIC ULTRASOUND (EUS): SHX6562

## 2020-12-16 LAB — GLUCOSE, CAPILLARY: Glucose-Capillary: 100 mg/dL — ABNORMAL HIGH (ref 70–99)

## 2020-12-16 SURGERY — UPPER ESOPHAGEAL ENDOSCOPIC ULTRASOUND (EUS)
Anesthesia: Monitor Anesthesia Care

## 2020-12-16 MED ORDER — PROPOFOL 10 MG/ML IV BOLUS
INTRAVENOUS | Status: DC | PRN
  Administered 2020-12-16: 50 mg via INTRAVENOUS
  Administered 2020-12-16: 20 mg via INTRAVENOUS

## 2020-12-16 MED ORDER — GLYCOPYRROLATE 0.2 MG/ML IJ SOLN
INTRAMUSCULAR | Status: DC | PRN
  Administered 2020-12-16: .2 mg via INTRAVENOUS

## 2020-12-16 MED ORDER — LACTATED RINGERS IV SOLN
INTRAVENOUS | Status: DC
  Administered 2020-12-16: 1000 mL via INTRAVENOUS

## 2020-12-16 MED ORDER — PROPOFOL 500 MG/50ML IV EMUL
INTRAVENOUS | Status: DC | PRN
  Administered 2020-12-16: 100 ug/kg/min via INTRAVENOUS

## 2020-12-16 MED ORDER — EPHEDRINE SULFATE 50 MG/ML IJ SOLN
INTRAMUSCULAR | Status: DC | PRN
  Administered 2020-12-16: 10 mg via INTRAVENOUS
  Administered 2020-12-16: 5 mg via INTRAVENOUS

## 2020-12-16 MED ORDER — PHENYLEPHRINE HCL (PRESSORS) 10 MG/ML IV SOLN
INTRAVENOUS | Status: DC | PRN
  Administered 2020-12-16: 120 ug via INTRAVENOUS

## 2020-12-16 MED ORDER — LIDOCAINE HCL (CARDIAC) PF 100 MG/5ML IV SOSY
PREFILLED_SYRINGE | INTRAVENOUS | Status: DC | PRN
  Administered 2020-12-16: 100 mg via INTRAVENOUS

## 2020-12-16 NOTE — Transfer of Care (Signed)
Immediate Anesthesia Transfer of Care Note  Patient: Jon Williams  Procedure(s) Performed: UPPER ESOPHAGEAL ENDOSCOPIC ULTRASOUND (EUS)  Patient Location: PACU  Anesthesia Type:MAC  Level of Consciousness: awake, alert  and oriented  Airway & Oxygen Therapy: Patient Spontanous Breathing  Post-op Assessment: Report given to RN and Post -op Vital signs reviewed and stable  Post vital signs: Reviewed and stable  Last Vitals:  Vitals Value Taken Time  BP 112/55 12/16/20 1000  Temp    Pulse 64 12/16/20 1001  Resp 29 12/16/20 1003  SpO2 92 % 12/16/20 1001  Vitals shown include unvalidated device data.  Last Pain:  Vitals:   12/16/20 0847  TempSrc: Oral  PainSc: 0-No pain         Complications: No notable events documented.

## 2020-12-16 NOTE — Op Note (Signed)
[facility] Patient Name: Jon Williams Procedure Date: 12/16/2020 MRN: [id number] Attending MD: [name] , MD Date of Birth: [birth year] CSN: [id number] Age: 70 Admit Type: Outpatient Procedure:                Upper EUS Indications:              Staging of gastric adenocarcinoma Providers:                [name], MD, Ervin Knack, Tyna Jaksch                            Technician Referring MD:              Medicines:                Propofol per Anesthesia Complications:            No immediate complications. Estimated Blood Loss:     Estimated blood loss: none. Procedure:                Pre-Anesthesia Assessment:                           - Prior to the procedure, a History and Physical                            was performed, and patient medications and                            allergies were reviewed. The patient's tolerance of                            previous anesthesia was also reviewed. The risks                            and benefits of the procedure and the sedation                            options and risks were discussed with the patient.                            All questions were answered, and informed consent                            was obtained. Prior Anticoagulants: The patient has                            taken no previous anticoagulant or antiplatelet                            agents. ASA Grade Assessment: III - A patient with                            severe systemic disease. After reviewing the risks                            and  benefits, the patient was deemed in                            satisfactory condition to undergo the procedure.                           - Sedation was administered by an anesthesia                            professional. Deep sedation was attained.                           After obtaining informed consent, the endoscope was                            passed under direct vision. Throughout  the                            procedure, the patient's blood pressure, pulse, and                            oxygen saturations were monitored continuously. The                            [id number] ([id number]) Olympus linear ultrasound scope                            was introduced through the mouth, and advanced to                            the second part of duodenum. The upper EUS was                            accomplished without difficulty. The patient                            tolerated the procedure well. Scope In: Scope Out: Findings:      ENDOSONOGRAPHIC FINDING: :      Localized wall thickening was visualized endosonographically in the       lesser curve of the stomach and in the antrum of the stomach. This       appeared to primarily be due to thickening within the serosa (Layer 5).       The gastric wall measured up to 10 mm in thickness.      There was no sign of significant endosonographic abnormality in the       entire pancreas. The pancreatic duct measured up to 1 mm in diameter.      There was no sign of significant endosonographic abnormality in the       common bile duct and in the gallbladder. The maximum diameter of the       ducts were 4 mm. An unremarkable gallbladder was identified.      There was no sign of significant endosonographic abnormality in the left       lobe of the liver. No masses were identified.  There was no sign of significant endosonographic abnormality in the left       adrenal gland. No masses were identified.      There was no sign of significant endosonographic abnormality involving       the celiac trunk or superior mesenteric artery.      There was no sign of significant endosonographic abnormality in the       mediastinum. No abnormal-appearing lymph nodes and no masses were       identified.      The gastric mass was identified. There were several focal areas of       subserosal invasion with an approximate 1 cm thickness. The  lesion is a       T3 N0 Mx. No suspicious nodes were found in the subcarina, AP window, or       Celiac axis. There was no evidence of any ascites or left lobe hepatic       mets. Impression:               - Wall thickening was seen in the lesser curve of                            the stomach and in the antrum of the stomach. The                            thickening appeared to be primarily within the                            serosa (Layer 5). T3 N0 Mx.                           - There was no sign of significant pathology in the                            entire pancreas.                           - There was no sign of significant pathology in the                            common bile duct and in the gallbladder.                           - There was no evidence of significant pathology in                            the left lobe of the liver.                           - Endosonographic images of the left adrenal gland                            were unremarkable.                           - The celiac trunk and superior mesenteric artery  were endosonographically normal.                           - The mediastinum was unremarkable                            endosonographically.                           - No specimens collected. Moderate Sedation:      Not Applicable - Patient had care per Anesthesia. Recommendation:           - Patient has a contact number available for                            emergencies. The signs and symptoms of potential                            delayed complications were discussed with the                            patient. Return to normal activities tomorrow.                            Written discharge instructions were provided to the                            patient.                           - Resume previous diet.                           - Further evaluation and management per Med and                             Surg Onc. Procedure Code(s):        --- Professional ---                           [phone], Esophagogastroduodenoscopy, flexible,                            transoral; with endoscopic ultrasound examination,                            including the esophagus, stomach, and either the                            duodenum or a surgically altered stomach where the                            jejunum is examined distal to the anastomosis Diagnosis Code(s):        --- Professional ---                           C16.9, Malignant neoplasm of  stomach, unspecified                           K31.89, Other diseases of stomach and duodenum CPT copyright 2019 American Medical Association. All rights reserved. The codes documented in this report are preliminary and upon coder review may  be revised to meet current compliance requirements. [name], MD [name], MD 12/16/2020 10:11:58 AM This report has been signed electronically. Number of Addenda: 0

## 2020-12-16 NOTE — Anesthesia Postprocedure Evaluation (Signed)
Anesthesia Post Note  Patient: Jon Williams  Procedure(s) Performed: UPPER ESOPHAGEAL ENDOSCOPIC ULTRASOUND (EUS)     Patient location during evaluation: Endoscopy Anesthesia Type: MAC Level of consciousness: awake Pain management: pain level controlled Vital Signs Assessment: post-procedure vital signs reviewed and stable Respiratory status: spontaneous breathing Cardiovascular status: stable Postop Assessment: no apparent nausea or vomiting Anesthetic complications: no   No notable events documented.  Last Vitals:  Vitals:   12/16/20 1012 12/16/20 1022  BP: 109/66 101/61  Pulse: 64 60  Resp: 17 (!) 28  Temp:    SpO2: 94% 95%    Last Pain:  Vitals:   12/16/20 1022  TempSrc:   PainSc: 0-No pain                 Huston Foley

## 2020-12-16 NOTE — Discharge Instructions (Signed)
YOU HAD AN ENDOSCOPIC PROCEDURE TODAY: Refer to the procedure report and other information in the discharge instructions given to you for any specific questions about what was found during the examination. If this information does not answer your questions, please call [facility] at [phone] to clarify.  ? ?YOU SHOULD EXPECT: Some feelings of bloating in the abdomen. Passage of more gas than usual. Walking can help get rid of the air that was put into your GI tract during the procedure and reduce the bloating. ? ?DIET: Your first meal following the procedure should be a light meal and then it is ok to progress to your normal diet. A half-sandwich or bowl of soup is an example of a good first meal. Heavy or fried foods are harder to digest and may make you feel nauseous or bloated. Drink plenty of fluids but you should avoid alcoholic beverages for 24 hours.  ? ?ACTIVITY: Your care partner should take you home directly after the procedure. You should plan to take it easy, moving slowly for the rest of the day. You can resume normal activity the day after the procedure however YOU SHOULD NOT DRIVE, use power tools, machinery or perform tasks that involve climbing or major physical exertion for 24 hours (because of the sedation medicines used during the test).  ? ?SYMPTOMS TO REPORT IMMEDIATELY: ?A gastroenterologist can be reached at any hour. Please call [phone]  for any of the following symptoms:  ? ?Following upper endoscopy (EGD, EUS, ERCP, esophageal dilation) ?Vomiting of blood or coffee ground material  ?New, significant abdominal pain  ?New, significant chest pain or pain under the shoulder blades  ?Painful or persistently difficult swallowing  ?New shortness of breath  ?Black, tarry-looking or red, bloody stools ? ?FOLLOW UP:  ?If any biopsies were taken you will be contacted by phone or by letter within the next 1-3 weeks. Call [phone]  if you have not heard about the biopsies in 3  weeks.  ?Please also call with any specific questions about appointments or follow up tests.  ?

## 2020-12-16 NOTE — H&P (Signed)
Jon Williams HPI: The patient was recently diagnosed with a gastric cancer.  He is here for staging.  Past Medical History:  Diagnosis Date   AICD (automatic cardioverter/defibrillator) present 05/25/2016   biv icd   Bunion, right    Chronic combined systolic and diastolic CHF (congestive heart failure) ([place])    Echo 1/18: Mild conc LVH, EF 15-20, severe diff HK, inf and inf-septal AK, Gr 3 DD, mild to mod MR, severe LAE, mod reduced RVSF, mod RAE, mild TR, PASP 50   Coronary artery disease involving native coronary artery without angina pectoris 04/17/2016   LHC 1/18: pLCx 55, mLCx 20, mRCA 40, dRCA 20, LVEDP 23, mean RA 8, PA 42/20, PCWP 17   Diabetes mellitus without complication (HCC)    DJD (degenerative joint disease)    History of atrial fibrillation    History of atrial flutter    History of cardiomegaly 06/07/2016   Noted on CXR   History of colon polyps 06/28/2017   Noted on colonoscopy   LBBB (left bundle branch block)    NICM (nonischemic cardiomyopathy) ([place])    Echo 1/18:  Mild conc LVH, EF 15-20, severe diff HK, inf and inf-septal AK, Gr 3 DD, mild to mod MR, severe LAE, mod reduced RVSF, mod RAE, mild TR, PASP 50   OA (osteoarthritis)    knee   Other secondary pulmonary hypertension ([place]) 04/17/2016   Prostate cancer ([place]) 2019   Sigmoid diverticulosis 06/28/2017   Noted on colonoscopy    Past Surgical History:  Procedure Laterality Date   BIOPSY  12/01/2020   Procedure: BIOPSY;  Surgeon: [name], MD;  Location: [facility];  Service: Endoscopy;;   BIV ICD INSERTION CRT-D N/A 05/25/2016   Procedure: BiV ICD Insertion CRT-D;  Surgeon: [name], MD;  Location: [facility];  Service: Cardiovascular;  Laterality: N/A;   CARDIAC CATHETERIZATION N/A 03/02/2016   Procedure: Right/Left Heart Cath and Coronary Angiography;  Surgeon: Nelva Bush, MD;  Location: [facility];  Service: Cardiovascular;  Laterality: N/A;   CARDIOVERSION N/A  07/17/2016   Procedure: Cardioversion;  Surgeon: [name], MD;  Location: [place] [facility];  Service: Cardiovascular;  Laterality: N/A;   COLONOSCOPY WITH PROPOFOL N/A 06/28/2017   Procedure: COLONOSCOPY WITH PROPOFOL;  Surgeon: [name], MD;  Location: [facility];  Service: Endoscopy;  Laterality: N/A;   colonscopy  2009   ESOPHAGOGASTRODUODENOSCOPY N/A 12/01/2020   Procedure: ESOPHAGOGASTRODUODENOSCOPY (EGD);  Surgeon: [name], MD;  Location: [facility];  Service: Endoscopy;  Laterality: N/A;  IDA/guaiac positive stools   GOLD SEED IMPLANT N/A 12/24/2017   Procedure: GOLD SEED IMPLANT, TRANSERINEAL;  Surgeon: Festus Aloe, MD;  Location: [facility];  Service: Urology;  Laterality: N/A;   INSERT / REPLACE / REMOVE PACEMAKER     LEAD REVISION  10/10/2018   LEAD REVISION/REPAIR N/A 10/10/2018   Procedure: LEAD REVISION/REPAIR;  Surgeon: [name], MD;  Location: [place] [facility];  Service: Cardiovascular;  Laterality: N/A;   POLYPECTOMY  06/28/2017   Procedure: POLYPECTOMY;  Surgeon: [name], MD;  Location: [facility];  Service: Endoscopy;;  ascending and descending colon polyp   PROSTATE BIOPSY  02/20/2017   SPACE OAR INSTILLATION N/A 12/24/2017   Procedure: SPACE OAR INSTILLATION;  Surgeon: Festus Aloe, MD;  Location: [facility];  Service: Urology;  Laterality: N/A;   TOTAL KNEE ARTHROPLASTY Right 08/16/2017   Procedure: RIGHT TOTAL KNEE ARTHROPLASTY;  Surgeon: [name], MD;  Location[facility];  Service: Orthopedics;  Laterality: Right;    Family History  Problem Relation Age of Onset   Hypertension Mother    Heart disease Mother    Diabetes Mother    Diabetes Father    Hypertension Father    Cancer Father        lung cancer   Healthy Sister    Heart attack Brother    Heart disease Brother 20       + tobacco   Healthy Brother     Social History:  reports that he has never smoked. He has never used smokeless tobacco. He reports that he  does not drink alcohol and does not use drugs.  Allergies:  Allergies  Allergen Reactions   Aspirin Anaphylaxis and Hives   Sulfa Antibiotics Anaphylaxis and Hives   Other Other (See Comments)    Medications: Scheduled: Continuous:  lactated ringers      No results found for this or any previous visit (from the past 24 hour(s)).   No results found.  ROS:  As stated above in the HPI otherwise negative.  There were no vitals taken for this visit.    PE: Gen: NAD, Alert and Oriented HEENT:  /AT, EOMI Neck: Supple, no LAD Lungs: CTA Bilaterally CV: RRR without M/G/R ABD: Soft, NTND, +BS Ext: No C/C/E  Assessment/Plan: 1) Gastric cancer - EUS for staging.  Lazette Estala D 12/16/2020, 8:46 AM

## 2020-12-16 NOTE — Anesthesia Preprocedure Evaluation (Addendum)
Anesthesia Evaluation  Patient identified by MRN, date of birth, ID band Patient awake    Reviewed: Allergy & Precautions, NPO status , Patient's Chart, lab work & pertinent test results  History of Anesthesia Complications Negative for: history of anesthetic complications  Airway Mallampati: III  TM Distance: >3 FB Neck ROM: Full    Dental  (+) Dental Advisory Given, Implants   Pulmonary neg pulmonary ROS,    breath sounds clear to auscultation       Cardiovascular (-) angina+ CAD and +CHF  + dysrhythmias Atrial Fibrillation + Cardiac Defibrillator  Rhythm:Irregular Rate:Normal  Walks 3-4 miles per day without pausing due to angina or SOB  '18 TTE - LV cavity was moderately dilated with mild concentric hypertrophy. EF 15% to 20%. Severe diffuse hypokinesis. Akinesis of the basal-midinferior and inferoseptal myocardium. Grade 3 diastolic dysfunction. Mild to moderate MR. LA was severely dilated. RV systolic function was moderately reduced. RA was moderately dilated. Mild TR. PASP was moderately increased. PA peak pressure: 50 mm Hg  '18 Cath - 1. Mild to moderate, non-obstructive coronary artery disease consistent with non-ischemic cardiomyopathy. 2. Upper normal to mildly elevated left and right heart filling pressures. 3. Mild pulmonary hypertension. 4. Reduced Fick cardiac output/index.    Neuro/Psych negative neurological ROS  negative psych ROS   GI/Hepatic negative GI ROS, Neg liver ROS,   Endo/Other  negative endocrine ROSdiabetes  Renal/GU Renal InsufficiencyRenal disease    Prostate cancer     Musculoskeletal  (+) Arthritis , Osteoarthritis,    Abdominal (+) + obese,   Peds  Hematology negative hematology ROS (+)   Anesthesia Other Findings   Reproductive/Obstetrics                             Anesthesia Physical  Anesthesia Plan  ASA: 3  Anesthesia Plan: MAC    Post-op Pain Management:    Induction: Intravenous  PONV Risk Score and Plan: 1 and Treatment may vary due to age or medical condition and Ondansetron  Airway Management Planned: Nasal Cannula, Natural Airway and Simple Face Mask  Additional Equipment: None  Intra-op Plan:   Post-operative Plan:   Informed Consent: I have reviewed the patients History and Physical, chart, labs and discussed the procedure including the risks, benefits and alternatives for the proposed anesthesia with the patient or authorized representative who has indicated his/her understanding and acceptance.     Dental advisory given  Plan Discussed with: CRNA  Anesthesia Plan Comments:        Anesthesia Quick Evaluation

## 2020-12-18 ENCOUNTER — Encounter (HOSPITAL_COMMUNITY): Payer: Self-pay | Admitting: Gastroenterology

## 2020-12-20 ENCOUNTER — Inpatient Hospital Stay: Payer: Medicare HMO

## 2020-12-20 ENCOUNTER — Encounter: Payer: Self-pay | Admitting: Hematology

## 2020-12-20 ENCOUNTER — Other Ambulatory Visit: Payer: Self-pay

## 2020-12-20 ENCOUNTER — Ambulatory Visit: Payer: Self-pay | Admitting: Surgery

## 2020-12-20 ENCOUNTER — Inpatient Hospital Stay: Payer: Medicare HMO | Admitting: Hematology

## 2020-12-20 VITALS — BP 107/72 | HR 71 | Temp 97.6°F | Resp 18 | Ht 66.0 in | Wt 207.8 lb

## 2020-12-20 VITALS — BP 116/79 | HR 75 | Temp 98.1°F | Resp 18 | Ht 66.0 in | Wt 208.2 lb

## 2020-12-20 DIAGNOSIS — D500 Iron deficiency anemia secondary to blood loss (chronic): Secondary | ICD-10-CM

## 2020-12-20 DIAGNOSIS — Z5189 Encounter for other specified aftercare: Secondary | ICD-10-CM | POA: Diagnosis not present

## 2020-12-20 DIAGNOSIS — R197 Diarrhea, unspecified: Secondary | ICD-10-CM | POA: Diagnosis not present

## 2020-12-20 DIAGNOSIS — C163 Malignant neoplasm of pyloric antrum: Secondary | ICD-10-CM

## 2020-12-20 DIAGNOSIS — D508 Other iron deficiency anemias: Secondary | ICD-10-CM | POA: Diagnosis not present

## 2020-12-20 DIAGNOSIS — Z5111 Encounter for antineoplastic chemotherapy: Secondary | ICD-10-CM | POA: Diagnosis not present

## 2020-12-20 DIAGNOSIS — C169 Malignant neoplasm of stomach, unspecified: Secondary | ICD-10-CM | POA: Diagnosis not present

## 2020-12-20 MED ORDER — ONDANSETRON HCL 8 MG PO TABS: 8.0000 mg | ORAL_TABLET | Freq: Two times a day (BID) | ORAL | 1 refills | Status: DC | PRN

## 2020-12-20 MED ORDER — DEXAMETHASONE 4 MG PO TABS: 8.0000 mg | ORAL_TABLET | Freq: Every day | ORAL | 1 refills | Status: DC

## 2020-12-20 MED ORDER — SODIUM CHLORIDE 0.9 % IV SOLN
400.0000 mg | Freq: Once | INTRAVENOUS | Status: AC
  Administered 2020-12-20: 400 mg via INTRAVENOUS
  Filled 2020-12-20: qty 20

## 2020-12-20 MED ORDER — LORATADINE 10 MG PO TABS
10.0000 mg | ORAL_TABLET | Freq: Once | ORAL | Status: AC
  Administered 2020-12-20: 10 mg via ORAL
  Filled 2020-12-20: qty 1

## 2020-12-20 MED ORDER — PROCHLORPERAZINE MALEATE 10 MG PO TABS: 10.0000 mg | ORAL_TABLET | Freq: Four times a day (QID) | ORAL | 1 refills | Status: DC | PRN

## 2020-12-20 MED ORDER — LIDOCAINE-PRILOCAINE 2.5-2.5 % EX CREA: TOPICAL_CREAM | CUTANEOUS | 3 refills | Status: DC

## 2020-12-20 MED ORDER — SODIUM CHLORIDE 0.9 % IV SOLN: Freq: Once | INTRAVENOUS | Status: AC

## 2020-12-20 NOTE — Progress Notes (Signed)
[place]   Telephone:([phone] Fax:(336) [phone]   Clinic Follow up Note   Patient Care Team: [name], MD as PCP - General (Internal Medicine) [name], [name], MD as PCP - Cardiology (Cardiology) [name], MD as PCP - Electrophysiology (Cardiology) [name], MD as Consulting Physician (General Surgery) Truitt Merle, MD as Consulting Physician (Hematology)  Date of Service:  12/20/2020  CHIEF COMPLAINT: f/u of gastric cancer  CURRENT THERAPY:  Neoadjuvant FLOT4  ASSESSMENT & PLAN:  Jon Williams is a 70 y.o. male with   1. Gastric Cancer, cT3N0M0, stage II -initially referred to ED by PCP on 11/30/20 for low hemoglobin of 7.4 on routine lab work. He was also recently treated for H-pylori gastritis. EGD on 12/01/20 by Dr. Benson Norway showed a tumor at the incisura, which pathology confirmed was adenocarcinoma.  -CT CAP 12/01/20 showed no metastatic disease. -He met with Dr. Zenia Resides on 12/05/20, who recommends EUS to determine staging.  -He underwent echo on 10/26 showing reduced EF and severe LV dysfunction. Per Dr. Lovena Le, surgery is recommended only if it is curative given his moderate risk. -He proceeded to EUS on 12/16/20 under Dr. Benson Norway, staging T3 N0. -IHC returned abnormal, showing loss of expression in MLH1 and PMS2. Which predict good response to immunotherapy  -I recommended neoadjuvant chemotherapy. I discussed the standard chemo regimen. Although he has severe cardiomyopathy, he does have decent performance status, and he is almost asymptomatic from his cancer. I think he can try FLOT4 with some dose reduction, if he does not tolerate, will change to FOLFOX. -Chemotherapy consent: Side effects including but does not not limited to, fatigue, nausea, vomiting, diarrhea, hair loss, neuropathy, fluid retention, renal and kidney dysfunction, neutropenic fever, needed for blood transfusion, bleeding, were discussed with patient in great detail. He  agrees to proceed. Plan to start next week  -the goal of chemotherapy is curative. -If he has poor tolerance to chemotherapy, I will switch to [facility] if I can get his insurance approval drug replacement.  I discussed that immunotherapy has not been approved in the early stage gastric cancer, but it has shown excellent response in MSI high metastatic gastric cancer.   2. H/o Prostate Cancer  -diagnosed with Gleason 4+4 prostate cancer in early 2019. He was treated with long-term ADT in combination with 8 weeks of IMRT.   3.  Diabetes, atrial fibrillation, severe cardiomyopathy with EF 20 to 25%, status post ICD placement -Follow-up with PCP and cardiology -We will carefully monitor his heart function during chemotherapy   4.  Iron deficient anemia -Secondary to gastric cancer -We will give IV Venofer 400 mg weekly x3. Today is week 2.    PLAN:  -iv Venofer today and in 1 week -chemo education class next week -port placement by Dr. Zenia Resides  -plan to start first cycle FLOT4 next week with dose reduction for first cycle, and GCSF on day 2  -f/u one week after fist cycle chemo    No problem-specific Assessment & Plan notes found for this encounter.   SUMMARY OF ONCOLOGIC HISTORY: Oncology History Overview Note  Cancer Staging Gastric cancer [facility]) Staging form: Stomach, AJCC 8th Edition - Clinical stage from 12/01/2020: Stage IIB (cT3, cN0, cM0) - Signed by Truitt Merle, MD on 12/20/2020 Stage prefix: Initial diagnosis Total positive nodes: 0  Malignant neoplasm of prostate ([place]) Staging form: Prostate, AJCC 8th Edition - Clinical: Stage IIC (cT2b, cN0, cM0, PSA: 6.7, Grade Group: 4) - Unsigned Prostate  specific antigen (PSA) range: Less than 10 Gleason score: 8 Histologic grading system: 5 grade system    Gastric cancer ([place])  12/01/2020 Procedure   Upper Endoscopy, Dr. Benson Norway  Impression: - Normal esophagus. 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 Biopsied. - Normal  examined duodenum.   12/01/2020 Pathology Results   FINAL MICROSCOPIC DIAGNOSIS:   A. INCISURA, BIOPSY:  - Adenocarcinoma, moderate to poorly differentiated arising in a  background of chronic gastritis with intestinal metaplasia.    12/01/2020 Imaging   CT CAP  IMPRESSION: Focal wall thickening along the posterior aspect of the gastric antrum, likely corresponding to the patient's newly diagnosed gastric cancer.   No findings suspicious for metastatic disease.   Mild multifocal pneumonia, lower lobe predominant, likely on the basis of aspiration. Trace right pleural effusion.   Fiducial markers along the prostate in this patient with known prostate cancer.   12/01/2020 Cancer Staging   Staging form: Stomach, AJCC 8th Edition - Clinical stage from 12/01/2020: Stage IIB (cT3, cN0, cM0) - Signed by Truitt Merle, MD on 12/20/2020 Stage prefix: Initial diagnosis Total positive nodes: 0    12/08/2020 Initial Diagnosis   Gastric cancer ([place])   12/16/2020 Procedure   EUS  - Wall thickening was seen in the lesser curve of the stomach and in the antrum of the stomach. The thickening appeared to be primarily within the serosa (Layer 5). T3 N0 Mx. - There was no sign of significant pathology in the entire pancreas. - There was no sign of significant pathology in the common bile duct and in the gallbladder. - There was no evidence of significant pathology in the left lobe of the liver. - Endosonographic images of the left adrenal gland were unremarkable. - The celiac trunk and superior mesenteric artery were endosonographically normal. - The mediastinum was unremarkable endosonographically. - No specimens collected.   12/29/2020 -  Chemotherapy   Patient is on Treatment Plan : GASTROESOPHAGEAL FLOT q14d X 4 cycles        INTERVAL HISTORY:  Jon Williams is here for a follow up of gastric cancer. He was last seen by me on 12/08/20 in consultation. He presents to the clinic  accompanied by his wife. He reports several instances of night sweats. He denies fevers.   All other systems were reviewed with the patient and are negative.  MEDICAL HISTORY:  Past Medical History:  Diagnosis Date   AICD (automatic cardioverter/defibrillator) present 05/25/2016   biv icd   Bunion, right    Chronic combined systolic and diastolic CHF (congestive heart failure) ([place])    Echo 1/18: Mild conc LVH, EF 15-20, severe diff HK, inf and inf-septal AK, Gr 3 DD, mild to mod MR, severe LAE, mod reduced RVSF, mod RAE, mild TR, PASP 50   Coronary artery disease involving native coronary artery without angina pectoris 04/17/2016   LHC 1/18: pLCx 23, mLCx 20, mRCA 40, dRCA 20, LVEDP 23, mean RA 8, PA 42/20, PCWP 17   Diabetes mellitus without complication (HCC)    DJD (degenerative joint disease)    History of atrial fibrillation    History of atrial flutter    History of cardiomegaly 06/07/2016   Noted on CXR   History of colon polyps 06/28/2017   Noted on colonoscopy   LBBB (left bundle branch block)    NICM (nonischemic cardiomyopathy) ([place])    Echo 1/18:  Mild conc LVH, EF 15-20, severe diff HK, inf and inf-septal AK, Gr 3 DD, mild to  mod MR, severe LAE, mod reduced RVSF, mod RAE, mild TR, PASP 50   OA (osteoarthritis)    knee   Other secondary pulmonary hypertension ([place]) 04/17/2016   Prostate cancer ([place]) 2019   Sigmoid diverticulosis 06/28/2017   Noted on colonoscopy    SURGICAL HISTORY: Past Surgical History:  Procedure Laterality Date   BIOPSY  12/01/2020   Procedure: BIOPSY;  Surgeon: [name], MD;  Location: [place];  Service: Endoscopy;;   BIV ICD INSERTION CRT-D N/A 05/25/2016   Procedure: BiV ICD Insertion CRT-D;  Surgeon: [name], MD;  Location: [place] [facility];  Service: Cardiovascular;  Laterality: N/A;   CARDIAC CATHETERIZATION N/A 03/02/2016   Procedure: Right/Left Heart Cath and Coronary Angiography;  Surgeon: Nelva Bush, MD;   Location: [place] [facility];  Service: Cardiovascular;  Laterality: N/A;   CARDIOVERSION N/A 07/17/2016   Procedure: Cardioversion;  Surgeon: [name], MD;  Location: [place] [facility];  Service: Cardiovascular;  Laterality: N/A;   COLONOSCOPY WITH PROPOFOL N/A 06/28/2017   Procedure: COLONOSCOPY WITH PROPOFOL;  Surgeon: [name], MD;  Location: [facility];  Service: Endoscopy;  Laterality: N/A;   colonscopy  2009   ESOPHAGOGASTRODUODENOSCOPY N/A 12/01/2020   Procedure: ESOPHAGOGASTRODUODENOSCOPY (EGD);  Surgeon: [name], MD;  Location: [facility];  Service: Endoscopy;  Laterality: N/A;  IDA/guaiac positive stools   ESOPHAGOGASTRODUODENOSCOPY (EGD) WITH PROPOFOL N/A 12/16/2020   Procedure: ESOPHAGOGASTRODUODENOSCOPY (EGD) WITH PROPOFOL;  Surgeon: [name], MD;  Location: [facility];  Service: Endoscopy;  Laterality: N/A;   GOLD SEED IMPLANT N/A 12/24/2017   Procedure: GOLD SEED IMPLANT, TRANSERINEAL;  Surgeon: Festus Aloe, MD;  Location: [facility];  Service: Urology;  Laterality: N/A;   INSERT / REPLACE / REMOVE PACEMAKER     LEAD REVISION  10/10/2018   LEAD REVISION/REPAIR N/A 10/10/2018   Procedure: LEAD REVISION/REPAIR;  Surgeon: [name], MD;  Location: [place] [facility];  Service: Cardiovascular;  Laterality: N/A;   POLYPECTOMY  06/28/2017   Procedure: POLYPECTOMY;  Surgeon: [name], MD;  [facility];  Service: Endoscopy;;  ascending and descending colon polyp   PROSTATE BIOPSY  02/20/2017   SPACE OAR INSTILLATION N/A 12/24/2017   Procedure: SPACE OAR INSTILLATION;  Surgeon: Festus Aloe, MD;  Location: [facility];  Service: Urology;  Laterality: N/A;   TOTAL KNEE ARTHROPLASTY Right 08/16/2017   Procedure: RIGHT TOTAL KNEE ARTHROPLASTY;  Surgeon: [name], MD;  Location: [place];  Service: Orthopedics;  Laterality: Right;   UPPER ESOPHAGEAL ENDOSCOPIC ULTRASOUND (EUS) N/A 12/16/2020   Procedure: UPPER ESOPHAGEAL ENDOSCOPIC  ULTRASOUND (EUS);  Surgeon: [name], MD;  Location: [facility];  Service: Endoscopy;  Laterality: N/A;    I have reviewed the social history and family history with the patient and they are unchanged from previous note.  ALLERGIES:  is allergic to aspirin, sulfa antibiotics, and other.  MEDICATIONS:  Current Outpatient Medications  Medication Sig Dispense Refill   atorvastatin (LIPITOR) 40 MG tablet TAKE 1 TABLET(40 MG) BY MOUTH DAILY 90 tablet 0   carvedilol (COREG) 3.125 MG tablet TAKE 1 TABLET(3.125 MG) BY MOUTH TWICE DAILY 180 tablet 0   cholecalciferol (VITAMIN D3) 25 MCG (1000 UT) tablet Take 1,000 Units by mouth daily with lunch.      dexamethasone (DECADRON) 4 MG tablet Take 2 tablets (8 mg total) by mouth daily. Take 1 tab daily starting the day after chemotherapy for 2 days. Take with food. 30 tablet 1   furosemide (LASIX) 40 MG  tablet Take 1 tablet (40 mg total) by mouth 2 (two) times daily. 180 tablet 3   lidocaine-prilocaine (EMLA) cream Apply to affected area once 30 g 3   metFORMIN (GLUCOPHAGE-XR) 500 MG 24 hr tablet Take 500 mg by mouth every evening.     Multiple Vitamin (MULTIVITAMIN WITH MINERALS) TABS tablet Take 1 tablet by mouth daily with lunch. One-A-Day     Naphazoline HCl (CLEAR EYES OP) Place 1 drop into both eyes daily as needed (irritation).     ondansetron (ZOFRAN) 4 MG tablet Take 1 tablet (4 mg total) by mouth every 6 (six) hours as needed for nausea. 20 tablet 0   ondansetron (ZOFRAN) 8 MG tablet Take 1 tablet (8 mg total) by mouth 2 (two) times daily as needed for refractory nausea / vomiting. Start on day 3 after chemotherapy. 30 tablet 1   pantoprazole (PROTONIX) 40 MG tablet Take 1 tablet (40 mg total) by mouth 2 (two) times daily. 60 tablet 0   potassium chloride (KLOR-CON) 10 MEQ tablet TAKE 1 TABLET(10 MEQ) BY MOUTH TWICE DAILY 180 tablet 3   prochlorperazine (COMPAZINE) 10 MG tablet Take 1 tablet (10 mg total) by mouth every 6 (six) hours as  needed (Nausea or vomiting). 30 tablet 1   sacubitril-valsartan (ENTRESTO) 24-26 MG Take 1 tablet by mouth 2 (two) times daily. 180 tablet 2   sotalol (BETAPACE) 80 MG tablet Take by mouth daily. Take 1 1/2 tabs daily (157m)     No current facility-administered medications for this visit.    PHYSICAL EXAMINATION: ECOG PERFORMANCE STATUS: 1 - Symptomatic but completely ambulatory  Vitals:   12/20/20 1329  BP: 107/72  Pulse: 71  Resp: 18  Temp: 97.6 F (36.4 C)  SpO2: 100%   Wt Readings from Last 3 Encounters:  12/20/20 208 lb 4 oz (94.5 kg)  12/20/20 207 lb 12.8 oz (94.3 kg)  12/08/20 221 lb 12.8 oz (100.6 kg)     GENERAL:alert, no distress and comfortable SKIN: skin color normal, no rashes or significant lesions EYES: normal, Conjunctiva are pink and non-injected, sclera clear  NEURO: alert & oriented x 3 with fluent speech  LABORATORY DATA:  I have reviewed the data as listed CBC Latest Ref Rng & Units 12/02/2020 12/01/2020 11/30/2020  WBC 4.0 - 10.5 K/uL 8.3 6.4 5.8  Hemoglobin 13.0 - 17.0 g/dL 8.3(L) 7.2(L) 7.1(L)  Hematocrit 39.0 - 52.0 % 27.0(L) 24.2(L) 24.3(L)  Platelets 150 - 400 K/uL 246 253 288     CMP Latest Ref Rng & Units 12/02/2020 12/01/2020 11/30/2020  Glucose 70 - 99 mg/dL 110(H) 96 145(H)  BUN 8 - 23 mg/dL 16 18 19   Creatinine 0.61 - 1.24 mg/dL 1.17 1.05 1.15  Sodium 135 - 145 mmol/L 133(L) 136 139  Potassium 3.5 - 5.1 mmol/L 3.9 3.8 3.3(L)  Chloride 98 - 111 mmol/L 103 107 107  CO2 22 - 32 mmol/L 22 22 25   Calcium 8.9 - 10.3 mg/dL 8.7(L) 8.7(L) 9.1  Total Protein 6.5 - 8.1 g/dL - - 6.9  Total Bilirubin 0.3 - 1.2 mg/dL - - 0.6  Alkaline Phos 38 - 126 U/L - - 52  AST 15 - 41 U/L - - 19  ALT 0 - 44 U/L - - 17      RADIOGRAPHIC STUDIES: I have personally reviewed the radiological images as listed and agreed with the findings in the report. No results found.    No orders of the defined types were placed in this encounter.  All questions  were answered. The patient knows to call the clinic with any problems, questions or concerns. No barriers to learning was detected. The total time spent in the appointment was 40 minutes.     Truitt Merle, MD 12/20/2020   I, Wilburn Mylar, am acting as scribe for Truitt Merle, MD.   I have reviewed the above documentation for accuracy and completeness, and I agree with the above.

## 2020-12-20 NOTE — Progress Notes (Signed)
START ON PATHWAY REGIMEN - Gastroesophageal     A cycle is every 14 days:     Docetaxel      Oxaliplatin      Leucovorin      Fluorouracil   **Always confirm dose/schedule in your pharmacy ordering system**  Patient Characteristics: Gastric, Adenocarcinoma, Preoperative or Nonsurgical Candidate (Clinical Staging), cT3 or Higher or cN+, Surgical Candidate (Up to cT4a), MSS/pMMR or MSI Unknown Histology: Adenocarcinoma Disease Classification: Gastric Therapeutic Status: Preoperative or Nonsurgical Candidate (Clinical Staging) AJCC N Category: cN0 AJCC M Category: cM0 AJCC 8 Stage Grouping: IIB AJCC T Category: cT3 Microsatellite/Mismatch Repair Status: MSS/pMMR Intent of Therapy: Curative Intent, Discussed with Patient

## 2020-12-20 NOTE — Patient Instructions (Signed)

## 2020-12-21 MED ORDER — ENTRESTO 24-26 MG PO TABS: 1.0000 | ORAL_TABLET | Freq: Two times a day (BID) | ORAL | 3 refills | Status: DC

## 2020-12-21 NOTE — Telephone Encounter (Signed)
Application along with printed signed prescription for [facility] faxed as requested.  Fax confirmation received.

## 2020-12-22 ENCOUNTER — Encounter (HOSPITAL_COMMUNITY): Payer: Self-pay | Admitting: Surgery

## 2020-12-22 ENCOUNTER — Encounter: Payer: Self-pay | Admitting: Internal Medicine

## 2020-12-22 DIAGNOSIS — E669 Obesity, unspecified: Secondary | ICD-10-CM | POA: Diagnosis not present

## 2020-12-22 DIAGNOSIS — I5042 Chronic combined systolic (congestive) and diastolic (congestive) heart failure: Secondary | ICD-10-CM | POA: Diagnosis not present

## 2020-12-22 DIAGNOSIS — Z6832 Body mass index (BMI) 32.0-32.9, adult: Secondary | ICD-10-CM | POA: Diagnosis not present

## 2020-12-22 DIAGNOSIS — E119 Type 2 diabetes mellitus without complications: Secondary | ICD-10-CM | POA: Diagnosis not present

## 2020-12-22 DIAGNOSIS — I2510 Atherosclerotic heart disease of native coronary artery without angina pectoris: Secondary | ICD-10-CM | POA: Diagnosis not present

## 2020-12-22 DIAGNOSIS — I110 Hypertensive heart disease with heart failure: Secondary | ICD-10-CM | POA: Diagnosis not present

## 2020-12-22 DIAGNOSIS — I4821 Permanent atrial fibrillation: Secondary | ICD-10-CM | POA: Diagnosis not present

## 2020-12-22 DIAGNOSIS — Z7901 Long term (current) use of anticoagulants: Secondary | ICD-10-CM | POA: Diagnosis not present

## 2020-12-22 DIAGNOSIS — C169 Malignant neoplasm of stomach, unspecified: Secondary | ICD-10-CM | POA: Diagnosis not present

## 2020-12-22 NOTE — Progress Notes (Addendum)
[facility] notified of pt surgery Device orders requested

## 2020-12-22 NOTE — Pre-Procedure Instructions (Signed)
PCP - Dr. Delfina Redwood Cardiologist - Dr. Lovena Le   EKG - 11/30/20 Chest x-ray - 12/01/20 ECHO - 12/07/20 Cardiac Cath -  CPAP - n/a  Fasting Blood Sugar:  90-100 Checks Blood Sugar:  only at MD  Blood Thinner Instructions: n/a Aspirin Instructions: n/a  ERAS Protcol - n/a  COVID TEST- n/a  Anesthesia review: n/a  -------------  SDW INSTRUCTIONS:  Your procedure is scheduled on 12/23/20. Please report to [facility] Main Entrance "A" at 08:20 A.M., and check in at the Admitting office. Call this number if you have problems the morning of surgery: [phone]   Remember: Do not eat or drink after midnight the night before your surgery   Medications to take morning of surgery with a sip of water include: Lipitor, coreg, protonix, sotalol  As of today, STOP taking any Aspirin (unless otherwise instructed by your surgeon), Aleve, Naproxen, Ibuprofen, Motrin, Advil, Goody's, BC's, all herbal medications, fish oil, and all vitamins.  WHAT DO I DO ABOUT MY DIABETES MEDICATION?  Do not take metformin the morning of surgery.  HOW TO MANAGE YOUR DIABETES BEFORE AND AFTER SURGERY  Why is it important to control my blood sugar before and after surgery? Improving blood sugar levels before and after surgery helps healing and can limit problems. A way of improving blood sugar control is eating a healthy diet by:  Eating less sugar and carbohydrates  Increasing activity/exercise  Talking with your doctor about reaching your blood sugar goals High blood sugars (greater than 180 mg/dL) can raise your risk of infections and slow your recovery, so you will need to focus on controlling your diabetes during the weeks before surgery. Make sure that the doctor who takes care of your diabetes knows about your planned surgery including the date and location.  How do I manage my blood sugar before surgery? Check your blood sugar at least 4 times a day, starting 2 days before surgery, to make sure  that the level is not too high or low.  Check your blood sugar the morning of your surgery when you wake up and every 2 hours until you get to the Short Stay unit.  If your blood sugar is less than 70 mg/dL, you will need to treat for low blood sugar: Do not take insulin. Treat a low blood sugar (less than 70 mg/dL) with  cup of clear juice (cranberry or apple), 4 glucose tablets, OR glucose gel. Recheck blood sugar in 15 minutes after treatment (to make sure it is greater than 70 mg/dL). If your blood sugar is not greater than 70 mg/dL on recheck, call [phone] for further instructions. Report your blood sugar to the short stay nurse when you get to Short Stay.  If you are admitted to the hospital after surgery: Your blood sugar will be checked by the staff and you will probably be given insulin after surgery (instead of oral diabetes medicines) to make sure you have good blood sugar levels. The goal for blood sugar control after surgery is 80-180 mg/dL.    The Morning of Surgery Do not wear jewelry, make-up or nail polish. Do not wear lotions, powders, or perfumes/colognes, or deodorant Do not shave 48 hours prior to surgery.   Men may shave face and neck. Do not bring valuables to the hospital. [facility] is not responsible for any belongings or valuables.  If you are a smoker, DO NOT Smoke 24 hours prior to surgery  If you wear a CPAP at night please  bring your mask the morning of surgery   Remember that you must have someone to transport you home after your surgery, and remain with you for 24 hours if you are discharged the same day.  Please bring cases for contacts, glasses, hearing aids, dentures or bridgework because it cannot be worn into surgery.   Patients discharged the day of surgery will not be allowed to drive home.   Please shower the NIGHT BEFORE/MORNING OF SURGERY (use antibacterial soap like DIAL soap if possible). Wear comfortable clothes the morning of  surgery. Oral Hygiene is also important to reduce your risk of infection.  Remember - BRUSH YOUR TEETH THE MORNING OF SURGERY WITH YOUR REGULAR TOOTHPASTE  Patient denies shortness of breath, fever, cough and chest pain.

## 2020-12-22 NOTE — Anesthesia Preprocedure Evaluation (Addendum)
Anesthesia Evaluation  Patient identified by MRN, date of birth, ID band Patient awake    Reviewed: Allergy & Precautions, NPO status , Patient's Chart, lab work & pertinent test results  Airway Mallampati: II  TM Distance: >3 FB Neck ROM: Full    Dental no notable dental hx. (+) Teeth Intact, Dental Advisory Given   Pulmonary neg pulmonary ROS,    Pulmonary exam normal breath sounds clear to auscultation       Cardiovascular + CAD and +CHF  Normal cardiovascular exam+ dysrhythmias Atrial Fibrillation + Cardiac Defibrillator  Rhythm:Regular Rate:Normal  Follows with EP cardiology for hx of HFrEF s/p St Jude dual chamber ICD (EF 20-25%, mod-sev MR), permanent Afib (xarelto on hold with GI bleed and malignancy). Last seen 12/06/20. Per note, "EF ~15% years ago. Update   Walks 3-4 miles a day   Neuro/Psych negative neurological ROS  negative psych ROS   GI/Hepatic negative GI ROS, Neg liver ROS,   Endo/Other  diabetes  Renal/GU Lab Results      Component                Value               Date                      CREATININE               1.17                12/02/2020                BUN                      16                  12/02/2020                NA                       133 (L)             12/02/2020                K                        3.9                 12/02/2020                CL                       103                 12/02/2020                CO2                      22                  12/02/2020                Musculoskeletal  (+) Arthritis ,   Abdominal (+) + obese (BMI 32.60),   Peds  Hematology  (+) anemia , Lab Results      Component  Value               Date                      WBC                      8.3                 12/02/2020                HGB                      8.3 (L)             12/02/2020                HCT                      27.0 (L)            12/02/2020                 MCV                      78.7 (L)            12/02/2020                PLT                      246                 12/02/2020              Anesthesia Other Findings All: ASA, Sulfa Abx  Reproductive/Obstetrics                           Anesthesia Physical Anesthesia Plan  ASA: 4  Anesthesia Plan: General   Post-op Pain Management:    Induction: Intravenous  PONV Risk Score and Plan: 3 and Treatment may vary due to age or medical condition and Ondansetron  Airway Management Planned: LMA  Additional Equipment: None  Intra-op Plan:   Post-operative Plan:   Informed Consent: I have reviewed the patients History and Physical, chart, labs and discussed the procedure including the risks, benefits and alternatives for the proposed anesthesia with the patient or authorized representative who has indicated his/her understanding and acceptance.     Dental advisory given  Plan Discussed with: CRNA and Anesthesiologist  Anesthesia Plan Comments: (PAT note by Karoline Caldwell, PA-C: Follows with EP cardiology for hx of HFrEF s/p St Jude dual chamber ICD (EF 20-25%, mod-sev MR), permanent Afib (xarelto on hold with GI bleed and malignancy). Last seen 12/06/20. Per note, "EF ~15% years ago. Update Despite that, By revised cardiac risk index Truman Hayward criteria) he is only at a "moderate risk" of peri-operative cardiac complications. Suspect this is "at least moderate" and more likely high riskgiven his co-morbidities. Given the nature of the malignancy, do not think his risk is prohibitive. He verbalizes understanding and wishes to move forward with any procedures that can treat his malignancy. Will update echo ASAP to help further risk stratify." Echo was updated 12/07/20 and Dr. Lovena Le commented on result stating, "Despite severe LV dysfunction he is a moderate surgical risk. If there is a possibility of curing his GI malignancy with surgery than I  would advocate  for proceeding with surgery. If surgery is only palliative then I would not recommend subjecting him to the risk of surgery."  Recent admission Oct 2022 for anemia. Hgb 7.1 in ED. He received 2 units packed red cell transfusion during the hospitalization. Hemoglobin 8.3 on discharge. EGD on 12/01/2020 showed possible malignant gastric tumor at the incisura which was biopsied  DM2, last A1c 6.2 on 12/01/20.  Will need DOS labs and evaluation.   TTE 12/07/20: 1. There is a false tendon in the mid LV cavity of no clinical  significance. . Left ventricular ejection fraction, by estimation, is 20  to 25%. The left ventricle has severely decreased function. The left  ventricle demonstrates global hypokinesis. The  left ventricular internal cavity size was severely dilated. Left  ventricular diastolic function could not be evaluated. The average left  ventricular global longitudinal strain is 13.9 %. The global longitudinal  strain is abnormal.  2. Right ventricular systolic function is normal. The right ventricular  size is moderately enlarged. There is mildly elevated pulmonary artery  systolic pressure. The estimated right ventricular systolic pressure is  25.3 mmHg.  3. Left atrial size was severely dilated.  4. Right atrial size was severely dilated.  5. The mitral valve is degenerative. Moderate to severe mitral valve  regurgitation. Moderate mitral stenosis. The mean mitral valve gradient is  1.0 mmHg. PISA ERO 0.34cm2, RV 53cc.  6. Tricuspid valve regurgitation is moderate.  7. The aortic valve is tricuspid. Aortic valve regurgitation is not  visualized. Mild aortic valve sclerosis is present, with no evidence of  aortic valve stenosis.  8. The inferior vena cava is normal in size with greater than 50%  respiratory variability, suggesting right atrial pressure of 3 mmHg.  9. Side by side comparison from study 2018 demonstrates persistence of  severe LV dysfunction. Mitral  and tricuspid regurgitation have worsened.  Consider TEE for further evaulation of MR if clinically indicated.   R/L cath 03/02/16: Conclusions: 1. Mild to moderate, non-obstructive coronary artery disease consistent with non-ischemic cardiomyopathy. 2. Upper normal to mildly elevated left and right heart filling pressures. 3. Mild pulmonary hypertension. 4. Reduced Fick cardiac output/index.  Recommendations: 1. Aggressive medical therapy for non-ischemic cardiomyopathy. 2. Medical therapy to prevent progression of non-obstructive CAD.  )      Anesthesia Quick Evaluation

## 2020-12-22 NOTE — Progress Notes (Signed)
[place] DEVICE PROGRAMMING  Patient Information: Name:  BILLIE INTRIAGO  DOB:  [birth year]  MRN:  [id number]    Clayborn Bigness, RN  P Cv Div Heartcare Device Planned Procedure:  Portacath Insertion  Surgeon:  Michaelle Birks  Date of Procedure:  12/23/20  Cautery will be used.  Position during surgery:  supine   Please send documentation back to:  [facility] (Fax # [phone])   Clayborn Bigness, RN  12/22/2020 3:25 PM  Device Information:  Clinic EP Physician:  Cristopher Peru, MD   Device Type:  Defibrillator Manufacturer and Phone #:  St. Jude/[employer]ott: [phone] Pacemaker Dependent?:  No. Date of Last Device Check:  12/10/20 (remote) 12/06/20 (in-clinic) Normal Device Function?:  Yes.    Electrophysiologist's Recommendations:  Have magnet available. Provide continuous ECG monitoring when magnet is used or reprogramming is to be performed.  Procedure may interfere with device function.  Magnet should be placed over device during procedure.  Per Device Clinic Standing Orders, York Ram, RN  4:33 PM 12/22/2020

## 2020-12-22 NOTE — Progress Notes (Signed)
Anesthesia Chart Review:  Follows with EP cardiology for hx of HFrEF s/p St Jude dual chamber ICD (EF 20-25%, mod-sev MR), permanent Afib (xarelto on hold with GI bleed and malignancy). Last seen 12/06/20. Per note, "EF ~15% years ago. Update Despite that, By revised cardiac risk index Truman Hayward criteria) he is only at a "moderate risk" of peri-operative cardiac complications. Suspect this is "at least moderate" and more likely high risk given his co-morbidities. Given the nature of the malignancy, do not think his risk is prohibitive. He verbalizes understanding and wishes to move forward with any procedures that can treat his malignancy. Will update echo ASAP to help further risk stratify." Echo was updated 12/07/20 and Dr. Lovena Le commented on result stating, "Despite severe LV dysfunction he is a moderate surgical risk. If there is a possibility of curing his GI malignancy with surgery than I would advocate for proceeding with surgery. If surgery is only palliative then I would not recommend subjecting him to the risk of surgery."  Recent admission Oct 2022 for anemia. Hgb 7.1 in ED. He received 2 units packed red cell transfusion during the hospitalization.  Hemoglobin 8.3 on discharge.  EGD on 12/01/2020 showed possible malignant gastric tumor at the incisura which was biopsied  DM2, last A1c 6.2 on 12/01/20.  Will need DOS labs and evaluation.   TTE 12/07/20:  1. There is a false tendon in the mid LV cavity of no clinical  significance. . Left ventricular ejection fraction, by estimation, is 20  to 25%. The left ventricle has severely decreased function. The left  ventricle demonstrates global hypokinesis. The  left ventricular internal cavity size was severely dilated. Left  ventricular diastolic function could not be evaluated. The average left  ventricular global longitudinal strain is 13.9 %. The global longitudinal  strain is abnormal.   2. Right ventricular systolic function is normal. The  right ventricular  size is moderately enlarged. There is mildly elevated pulmonary artery  systolic pressure. The estimated right ventricular systolic pressure is  25.0 mmHg.   3. Left atrial size was severely dilated.   4. Right atrial size was severely dilated.   5. The mitral valve is degenerative. Moderate to severe mitral valve  regurgitation. Moderate mitral stenosis. The mean mitral valve gradient is  1.0 mmHg. PISA ERO 0.34cm2, RV 53cc.   6. Tricuspid valve regurgitation is moderate.   7. The aortic valve is tricuspid. Aortic valve regurgitation is not  visualized. Mild aortic valve sclerosis is present, with no evidence of  aortic valve stenosis.   8. The inferior vena cava is normal in size with greater than 50%  respiratory variability, suggesting right atrial pressure of 3 mmHg.   9. Side by side comparison from study 2018 demonstrates persistence of  severe LV dysfunction. Mitral and tricuspid regurgitation have worsened.  Consider TEE for further evaulation of MR if clinically indicated.   R/L cath 03/02/16: Conclusions: Mild to moderate, non-obstructive coronary artery disease consistent with non-ischemic cardiomyopathy. Upper normal to mildly elevated left and right heart filling pressures. Mild pulmonary hypertension. Reduced Fick cardiac output/index.   Recommendations: Aggressive medical therapy for non-ischemic cardiomyopathy. Medical therapy to prevent progression of non-obstructive CAD.     Wynonia Musty [facility] Stay [facility]/Anesthesiology Phone [phone] 12/22/2020 3:54 PM

## 2020-12-23 ENCOUNTER — Encounter (HOSPITAL_COMMUNITY)
Admission: RE | Disposition: A | Discharge status: Home / Self Care | Payer: Self-pay | Source: Ambulatory Visit | Attending: Surgery

## 2020-12-23 ENCOUNTER — Ambulatory Visit (HOSPITAL_COMMUNITY): Payer: Medicare HMO

## 2020-12-23 ENCOUNTER — Telehealth: Payer: Self-pay | Admitting: Hematology

## 2020-12-23 ENCOUNTER — Ambulatory Visit (HOSPITAL_COMMUNITY): Payer: Medicare HMO | Admitting: Physician Assistant

## 2020-12-23 ENCOUNTER — Other Ambulatory Visit: Payer: Self-pay

## 2020-12-23 ENCOUNTER — Ambulatory Visit (HOSPITAL_COMMUNITY)
Admission: RE | Admit: 2020-12-23 | Discharge: 2020-12-23 | Disposition: A | Discharge status: Home / Self Care | Payer: Medicare HMO | Source: Ambulatory Visit | Attending: Surgery | Admitting: Surgery

## 2020-12-23 ENCOUNTER — Encounter (HOSPITAL_COMMUNITY): Payer: Self-pay | Admitting: Surgery

## 2020-12-23 DIAGNOSIS — Z452 Encounter for adjustment and management of vascular access device: Secondary | ICD-10-CM | POA: Diagnosis not present

## 2020-12-23 DIAGNOSIS — C169 Malignant neoplasm of stomach, unspecified: Secondary | ICD-10-CM | POA: Diagnosis not present

## 2020-12-23 DIAGNOSIS — I110 Hypertensive heart disease with heart failure: Secondary | ICD-10-CM | POA: Insufficient documentation

## 2020-12-23 DIAGNOSIS — E669 Obesity, unspecified: Secondary | ICD-10-CM | POA: Insufficient documentation

## 2020-12-23 DIAGNOSIS — I4821 Permanent atrial fibrillation: Secondary | ICD-10-CM | POA: Diagnosis not present

## 2020-12-23 DIAGNOSIS — I5042 Chronic combined systolic (congestive) and diastolic (congestive) heart failure: Secondary | ICD-10-CM | POA: Diagnosis not present

## 2020-12-23 DIAGNOSIS — I2510 Atherosclerotic heart disease of native coronary artery without angina pectoris: Secondary | ICD-10-CM | POA: Insufficient documentation

## 2020-12-23 DIAGNOSIS — E119 Type 2 diabetes mellitus without complications: Secondary | ICD-10-CM | POA: Insufficient documentation

## 2020-12-23 DIAGNOSIS — Z7901 Long term (current) use of anticoagulants: Secondary | ICD-10-CM | POA: Insufficient documentation

## 2020-12-23 DIAGNOSIS — I4819 Other persistent atrial fibrillation: Secondary | ICD-10-CM | POA: Diagnosis not present

## 2020-12-23 DIAGNOSIS — Z6832 Body mass index (BMI) 32.0-32.9, adult: Secondary | ICD-10-CM | POA: Insufficient documentation

## 2020-12-23 DIAGNOSIS — C163 Malignant neoplasm of pyloric antrum: Secondary | ICD-10-CM | POA: Diagnosis not present

## 2020-12-23 DIAGNOSIS — Z95828 Presence of other vascular implants and grafts: Secondary | ICD-10-CM

## 2020-12-23 DIAGNOSIS — Z419 Encounter for procedure for purposes other than remedying health state, unspecified: Secondary | ICD-10-CM

## 2020-12-23 DIAGNOSIS — I517 Cardiomegaly: Secondary | ICD-10-CM | POA: Diagnosis not present

## 2020-12-23 DIAGNOSIS — I447 Left bundle-branch block, unspecified: Secondary | ICD-10-CM | POA: Diagnosis not present

## 2020-12-23 DIAGNOSIS — D500 Iron deficiency anemia secondary to blood loss (chronic): Secondary | ICD-10-CM | POA: Diagnosis not present

## 2020-12-23 HISTORY — PX: PORTACATH PLACEMENT: SHX2246

## 2020-12-23 LAB — GLUCOSE, CAPILLARY
Glucose-Capillary: 106 mg/dL — ABNORMAL HIGH (ref 70–99)
Glucose-Capillary: 103 mg/dL — ABNORMAL HIGH (ref 70–99)

## 2020-12-23 SURGERY — INSERTION, TUNNELED CENTRAL VENOUS DEVICE, WITH PORT
Anesthesia: General | Site: Chest | Laterality: Right

## 2020-12-23 MED ORDER — FENTANYL CITRATE (PF) 100 MCG/2ML IJ SOLN
INTRAMUSCULAR | Status: DC | PRN
  Administered 2020-12-23 (×2): 25 ug via INTRAVENOUS

## 2020-12-23 MED ORDER — MIDAZOLAM HCL 2 MG/2ML IJ SOLN
INTRAMUSCULAR | Status: AC
  Filled 2020-12-23: qty 2

## 2020-12-23 MED ORDER — LACTATED RINGERS IV SOLN: INTRAVENOUS | Status: DC

## 2020-12-23 MED ORDER — ACETAMINOPHEN 10 MG/ML IV SOLN: 1000.0000 mg | Freq: Once | INTRAVENOUS | Status: DC | PRN

## 2020-12-23 MED ORDER — HEPARIN SOD (PORK) LOCK FLUSH 100 UNIT/ML IV SOLN
INTRAVENOUS | Status: AC
  Filled 2020-12-23: qty 5

## 2020-12-23 MED ORDER — PROPOFOL 10 MG/ML IV BOLUS
INTRAVENOUS | Status: DC | PRN
  Administered 2020-12-23: 90 mg via INTRAVENOUS

## 2020-12-23 MED ORDER — CHLORHEXIDINE GLUCONATE 0.12 % MT SOLN
15.0000 mL | Freq: Once | OROMUCOSAL | Status: AC
  Administered 2020-12-23: 15 mL via OROMUCOSAL
  Filled 2020-12-23: qty 15

## 2020-12-23 MED ORDER — CEFAZOLIN SODIUM-DEXTROSE 2-4 GM/100ML-% IV SOLN
2.0000 g | INTRAVENOUS | Status: DC
  Filled 2020-12-23: qty 100

## 2020-12-23 MED ORDER — HEPARIN 6000 UNIT IRRIGATION SOLUTION
Status: AC
  Filled 2020-12-23: qty 500

## 2020-12-23 MED ORDER — LACTATED RINGERS IV SOLN: INTRAVENOUS | Status: DC | PRN

## 2020-12-23 MED ORDER — PHENYLEPHRINE HCL-NACL 20-0.9 MG/250ML-% IV SOLN: INTRAVENOUS | Status: DC | PRN

## 2020-12-23 MED ORDER — TRAMADOL HCL 50 MG PO TABS: 50.0000 mg | ORAL_TABLET | Freq: Four times a day (QID) | ORAL | 0 refills | Status: AC | PRN

## 2020-12-23 MED ORDER — BUPIVACAINE-EPINEPHRINE (PF) 0.25% -1:200000 IJ SOLN
INTRAMUSCULAR | Status: AC
  Filled 2020-12-23: qty 30

## 2020-12-23 MED ORDER — EPHEDRINE SULFATE 50 MG/ML IJ SOLN
INTRAMUSCULAR | Status: DC | PRN
  Administered 2020-12-23: 10 mg via INTRAVENOUS

## 2020-12-23 MED ORDER — FENTANYL CITRATE (PF) 100 MCG/2ML IJ SOLN: 25.0000 ug | INTRAMUSCULAR | Status: DC | PRN

## 2020-12-23 MED ORDER — FENTANYL CITRATE (PF) 250 MCG/5ML IJ SOLN
INTRAMUSCULAR | Status: AC
  Filled 2020-12-23: qty 5

## 2020-12-23 MED ORDER — ONDANSETRON HCL 4 MG/2ML IJ SOLN: 4.0000 mg | Freq: Once | INTRAMUSCULAR | Status: DC | PRN

## 2020-12-23 MED ORDER — ONDANSETRON HCL 4 MG/2ML IJ SOLN
INTRAMUSCULAR | Status: DC | PRN
  Administered 2020-12-23: 4 mg via INTRAVENOUS

## 2020-12-23 MED ORDER — HEPARIN SOD (PORK) LOCK FLUSH 100 UNIT/ML IV SOLN
INTRAVENOUS | Status: DC | PRN
  Administered 2020-12-23: 500 [IU] via INTRAVENOUS

## 2020-12-23 MED ORDER — MIDAZOLAM HCL 5 MG/5ML IJ SOLN
INTRAMUSCULAR | Status: DC | PRN
  Administered 2020-12-23: 2 mg via INTRAVENOUS

## 2020-12-23 MED ORDER — HEPARIN 6000 UNIT IRRIGATION SOLUTION
Status: DC | PRN
  Administered 2020-12-23: 1

## 2020-12-23 MED ORDER — BUPIVACAINE-EPINEPHRINE 0.25% -1:200000 IJ SOLN
INTRAMUSCULAR | Status: DC | PRN
  Administered 2020-12-23: 10 mL

## 2020-12-23 MED ORDER — PROPOFOL 10 MG/ML IV BOLUS
INTRAVENOUS | Status: AC
  Filled 2020-12-23: qty 20

## 2020-12-23 MED ORDER — PHENYLEPHRINE HCL (PRESSORS) 10 MG/ML IV SOLN
INTRAVENOUS | Status: DC | PRN
  Administered 2020-12-23 (×3): 40 ug via INTRAVENOUS

## 2020-12-23 MED ORDER — ORAL CARE MOUTH RINSE: 15.0000 mL | Freq: Once | OROMUCOSAL | Status: AC

## 2020-12-23 SURGICAL SUPPLY — 36 items
BAG COUNTER SPONGE SURGICOUNT (BAG) ×2 IMPLANT
BAG DECANTER FOR FLEXI CONT (MISCELLANEOUS) ×2 IMPLANT
CHLORAPREP W/TINT 26 (MISCELLANEOUS) ×2 IMPLANT
COVER SURGICAL LIGHT HANDLE (MISCELLANEOUS) ×2 IMPLANT
COVER TRANSDUCER ULTRASND GEL (DISPOSABLE) IMPLANT
DERMABOND ADVANCED (GAUZE/BANDAGES/DRESSINGS) ×1
DERMABOND ADVANCED .7 DNX12 (GAUZE/BANDAGES/DRESSINGS) ×1 IMPLANT
DRAPE C-ARM 42X120 X-RAY (DRAPES) ×2 IMPLANT
DRAPE LAPAROSCOPIC ABDOMINAL (DRAPES) ×2 IMPLANT
ELECT CAUTERY BLADE 6.4 (BLADE) ×2 IMPLANT
ELECT REM PT RETURN 9FT ADLT (ELECTROSURGICAL) ×2
ELECTRODE REM PT RTRN 9FT ADLT (ELECTROSURGICAL) ×1 IMPLANT
GEL ULTRASOUND 20GR AQUASONIC (MISCELLANEOUS) IMPLANT
GLOVE SURG SYN 5.5 (GLOVE) ×2 IMPLANT
GLOVE SURG UNDER POLY LF SZ6 (GLOVE) ×2 IMPLANT
GOWN STRL REUS W/ TWL LRG LVL3 (GOWN DISPOSABLE) ×2 IMPLANT
GOWN STRL REUS W/TWL LRG LVL3 (GOWN DISPOSABLE) ×2
INTRODUCER COOK 11FR (CATHETERS) IMPLANT
KIT BASIN OR (CUSTOM PROCEDURE TRAY) ×2 IMPLANT
KIT PORT POWER 8FR ISP CVUE (Port) ×2 IMPLANT
KIT TURNOVER KIT B (KITS) ×2 IMPLANT
NS IRRIG 1000ML POUR BTL (IV SOLUTION) ×2 IMPLANT
PAD ARMBOARD 7.5X6 YLW CONV (MISCELLANEOUS) ×2 IMPLANT
PENCIL BUTTON HOLSTER BLD 10FT (ELECTRODE) ×2 IMPLANT
POSITIONER HEAD DONUT 9IN (MISCELLANEOUS) ×2 IMPLANT
SET INTRODUCER 12FR PACEMAKER (INTRODUCER) IMPLANT
SET SHEATH INTRODUCER 10FR (MISCELLANEOUS) IMPLANT
SHEATH COOK PEEL AWAY SET 9F (SHEATH) IMPLANT
SUT MNCRL AB 4-0 PS2 18 (SUTURE) ×2 IMPLANT
SUT PROLENE 2 0 SH DA (SUTURE) ×4 IMPLANT
SUT VIC AB 3-0 SH 27 (SUTURE) ×1
SUT VIC AB 3-0 SH 27X BRD (SUTURE) ×1 IMPLANT
SYR 5ML LUER SLIP (SYRINGE) ×2 IMPLANT
TOWEL GREEN STERILE (TOWEL DISPOSABLE) ×2 IMPLANT
TOWEL GREEN STERILE FF (TOWEL DISPOSABLE) ×2 IMPLANT
TRAY LAPAROSCOPIC MC (CUSTOM PROCEDURE TRAY) ×2 IMPLANT

## 2020-12-23 NOTE — Anesthesia Procedure Notes (Addendum)
Procedure Name: LMA Insertion Date/Time: 12/23/2020 11:24 AM Performed by: Yehuda Mao, CRNA Pre-anesthesia Checklist: Patient identified, Emergency Drugs available, Suction available and Patient being monitored Patient Re-evaluated:Patient Re-evaluated prior to induction Oxygen Delivery Method: Circle system utilized Preoxygenation: Pre-oxygenation with 100% oxygen Induction Type: IV induction Ventilation: Mask ventilation without difficulty LMA: LMA inserted LMA Size: 5.0 Tube type: Oral Number of attempts: 1 Airway Equipment and Method: Stylet and Oral airway Placement Confirmation: ETT inserted through vocal cords under direct vision, positive ETCO2 and breath sounds checked- equal and bilateral Tube secured with: Tape Dental Injury: Teeth and Oropharynx as per pre-operative assessment

## 2020-12-23 NOTE — Telephone Encounter (Signed)
Left message with follow-up appointments per 1/18 los. °

## 2020-12-23 NOTE — H&P (Signed)
Jon Williams is an 70 y.o. male.   Chief Complaint: gastric cancer HPI: [name] is a 70 year old male who was recently diagnosed with gastric adenocarcinoma after presenting with fatigue, weakness, and anemia.  Following his initial clinic visit with me, he underwent an EUS for staging, which showed a T3N0 tumor.  He will be undergoing neoadjuvant chemotherapy and presents today for Port-A-Cath placement.  Past Medical History:  Diagnosis Date   AICD (automatic cardioverter/defibrillator) present 05/25/2016   biv icd   Bunion, right    Chronic combined systolic and diastolic CHF (congestive heart failure) ([place])    Echo 1/18: Mild conc LVH, EF 15-20, severe diff HK, inf and inf-septal AK, Gr 3 DD, mild to mod MR, severe LAE, mod reduced RVSF, mod RAE, mild TR, PASP 50   Coronary artery disease involving native coronary artery without angina pectoris 04/17/2016   LHC 1/18: pLCx 40, mLCx 20, mRCA 40, dRCA 20, LVEDP 23, mean RA 8, PA 42/20, PCWP 17   Diabetes mellitus without complication (HCC)    DJD (degenerative joint disease)    History of atrial fibrillation    History of atrial flutter    History of cardiomegaly 06/07/2016   Noted on CXR   History of colon polyps 06/28/2017   Noted on colonoscopy   LBBB (left bundle branch block)    NICM (nonischemic cardiomyopathy) ([place])    Echo 1/18:  Mild conc LVH, EF 15-20, severe diff HK, inf and inf-septal AK, Gr 3 DD, mild to mod MR, severe LAE, mod reduced RVSF, mod RAE, mild TR, PASP 50   OA (osteoarthritis)    knee   Other secondary pulmonary hypertension ([place]) 04/17/2016   Prostate cancer ([place]) 2019   Sigmoid diverticulosis 06/28/2017   Noted on colonoscopy    Past Surgical History:  Procedure Laterality Date   BIOPSY  12/01/2020   Procedure: BIOPSY;  Surgeon: [name], MD;  Location: [place];  Service: Endoscopy;;   BIV ICD INSERTION CRT-D N/A 05/25/2016   Procedure: BiV ICD Insertion CRT-D;  Surgeon: [name],  MD;  Location: [place] [facility];  Service: Cardiovascular;  Laterality: N/A;   CARDIAC CATHETERIZATION N/A 03/02/2016   Procedure: Right/Left Heart Cath and Coronary Angiography;  Surgeon: Nelva Bush, MD;  Location: [place] [facility];  Service: Cardiovascular;  Laterality: N/A;   CARDIOVERSION N/A 07/17/2016   Procedure: Cardioversion;  Surgeon: [name], MD;  Location: [place] [facility];  Service: Cardiovascular;  Laterality: N/A;   COLONOSCOPY WITH PROPOFOL N/A 06/28/2017   Procedure: COLONOSCOPY WITH PROPOFOL;  Surgeon: [name], MD;  Location: [facility];  Service: Endoscopy;  Laterality: N/A;   colonscopy  2009   ESOPHAGOGASTRODUODENOSCOPY N/A 12/01/2020   Procedure: ESOPHAGOGASTRODUODENOSCOPY (EGD);  Surgeon: [name], MD;  Location: [place];  Service: Endoscopy;  Laterality: N/A;  IDA/guaiac positive stools   ESOPHAGOGASTRODUODENOSCOPY (EGD) WITH PROPOFOL N/A 12/16/2020   Procedure: ESOPHAGOGASTRODUODENOSCOPY (EGD) WITH PROPOFOL;  Surgeon: [name], MD;  Location: [facility];  Service: Endoscopy;  Laterality: N/A;   GOLD SEED IMPLANT N/A 12/24/2017   Procedure: GOLD SEED IMPLANT, TRANSERINEAL;  Surgeon: Festus Aloe, MD;  Location: [facility];  Service: Urology;  Laterality: N/A;   INSERT / REPLACE / REMOVE PACEMAKER     LEAD REVISION  10/10/2018   LEAD REVISION/REPAIR N/A 10/10/2018   Procedure: LEAD REVISION/REPAIR;  Surgeon: [name], MD;  Location: [place] [facility];  Service: Cardiovascular;  Laterality: N/A;   POLYPECTOMY  06/28/2017  Procedure: POLYPECTOMY;  Surgeon: [name], MD;  Location: [facility];  Service: Endoscopy;;  ascending and descending colon polyp   PROSTATE BIOPSY  02/20/2017   SPACE OAR INSTILLATION N/A 12/24/2017   Procedure: SPACE OAR INSTILLATION;  Surgeon: Festus Aloe, MD;  Location: [facility];  Service: Urology;  Laterality: N/A;   TOTAL KNEE ARTHROPLASTY Right 08/16/2017   Procedure: RIGHT TOTAL KNEE  ARTHROPLASTY;  Surgeon: [name], MD;  Location: [place];  Service: Orthopedics;  Laterality: Right;   UPPER ESOPHAGEAL ENDOSCOPIC ULTRASOUND (EUS) N/A 12/16/2020   Procedure: UPPER ESOPHAGEAL ENDOSCOPIC ULTRASOUND (EUS);  Surgeon: [name], MD;  Location[facility];  Service: Endoscopy;  Laterality: N/A;    Family History  Problem Relation Age of Onset   Hypertension Mother    Heart disease Mother    Diabetes Mother    Diabetes Father    Hypertension Father    Cancer Father        lung cancer   Healthy Sister    Heart attack Brother    Heart disease Brother 4       + tobacco   Healthy Brother    Social History:  reports that he has never smoked. He has never used smokeless tobacco. He reports that he does not drink alcohol and does not use drugs.  Allergies:  Allergies  Allergen Reactions   Aspirin Anaphylaxis and Hives   Sulfa Antibiotics Anaphylaxis and Hives   Other Other (See Comments)    Medications Prior to Admission  Medication Sig Dispense Refill   atorvastatin (LIPITOR) 40 MG tablet TAKE 1 TABLET(40 MG) BY MOUTH DAILY 90 tablet 0   carvedilol (COREG) 3.125 MG tablet TAKE 1 TABLET(3.125 MG) BY MOUTH TWICE DAILY 180 tablet 0   cholecalciferol (VITAMIN D3) 25 MCG (1000 UT) tablet Take 1,000 Units by mouth daily with lunch.      furosemide (LASIX) 40 MG tablet Take 1 tablet (40 mg total) by mouth 2 (two) times daily. 180 tablet 3   metFORMIN (GLUCOPHAGE-XR) 500 MG 24 hr tablet Take 500 mg by mouth every evening.     Multiple Vitamin (MULTIVITAMIN WITH MINERALS) TABS tablet Take 1 tablet by mouth daily with lunch. One-A-Day     Naphazoline HCl (CLEAR EYES OP) Place 1 drop into both eyes daily as needed (irritation).     pantoprazole (PROTONIX) 40 MG tablet Take 1 tablet (40 mg total) by mouth 2 (two) times daily. 60 tablet 0   potassium chloride (KLOR-CON) 10 MEQ tablet TAKE 1 TABLET(10 MEQ) BY MOUTH TWICE DAILY 180 tablet 3   sacubitril-valsartan (ENTRESTO)  24-26 MG Take 1 tablet by mouth 2 (two) times daily. 180 tablet 3   sotalol (BETAPACE) 80 MG tablet Take 120 mg by mouth in the morning and at bedtime.     dexamethasone (DECADRON) 4 MG tablet Take 2 tablets (8 mg total) by mouth daily. Take 1 tab daily starting the day after chemotherapy for 2 days. Take with food. 30 tablet 1   lidocaine-prilocaine (EMLA) cream Apply to affected area once 30 g 3   ondansetron (ZOFRAN) 4 MG tablet Take 1 tablet (4 mg total) by mouth every 6 (six) hours as needed for nausea. 20 tablet 0   ondansetron (ZOFRAN) 8 MG tablet Take 1 tablet (8 mg total) by mouth 2 (two) times daily as needed for refractory nausea / vomiting. Start on day 3 after chemotherapy. 30 tablet 1   prochlorperazine (COMPAZINE) 10 MG tablet Take 1 tablet (10 mg total)  by mouth every 6 (six) hours as needed (Nausea or vomiting). 30 tablet 1    Results for orders placed or performed during the hospital encounter of 12/23/20 (from the past 48 hour(s))  Glucose, capillary     Status: Abnormal   Collection Time: 12/23/20  8:26 AM  Result Value Ref Range   Glucose-Capillary 106 (H) 70 - 99 mg/dL    Comment: Glucose reference range applies only to samples taken after fasting for at least 8 hours.   No results found.  Review of Systems  Blood pressure 126/74, pulse 60, temperature 97.6 F (36.4 C), resp. rate 17, height 5\' 6"  (1.676 m), weight 91.6 kg, SpO2 100 %. Physical Exam Vitals reviewed.  Constitutional:      Appearance: Normal appearance.  HENT:     Head: Normocephalic and atraumatic.  Eyes:     General: No scleral icterus.    Conjunctiva/sclera: Conjunctivae normal.  Cardiovascular:     Comments: Scar on left upper chest wall over AICD. Pulmonary:     Effort: Pulmonary effort is normal. No respiratory distress.  Musculoskeletal:        General: Normal range of motion.     Cervical back: Normal range of motion.  Skin:    General: Skin is warm and dry.  Neurological:      General: No focal deficit present.     Mental Status: He is alert and oriented to person, place, and time.  Psychiatric:        Mood and Affect: Mood normal.        Behavior: Behavior normal.        Thought Content: Thought content normal.     Assessment/Plan 70 year old male with T3N0 gastric adenocarcinoma.  Proceed to the OR for Port-A-Cath placement.  Risks and benefits of surgery were discussed including risk of pneumothorax.  Plan for discharge home from PACU after chest x-ray.  [name], MD 12/23/2020, 10:47 AM

## 2020-12-23 NOTE — Transfer of Care (Signed)
Immediate Anesthesia Transfer of Care Note  Patient: Jon Williams  Procedure(s) Performed: INSERTION PORT-A-CATH (Right: Chest)  Patient Location: PACU  Anesthesia Type:General  Level of Consciousness: awake  Airway & Oxygen Therapy: Patient Spontanous Breathing  Post-op Assessment: Report given to RN and Post -op Vital signs reviewed and stable  Post vital signs: stable  Last Vitals:  Vitals Value Taken Time  BP 128/79 12/23/20 1212  Temp    Pulse 68 12/23/20 1215  Resp 9 12/23/20 1215  SpO2 96 % 12/23/20 1215  Vitals shown include unvalidated device data.  Last Pain:  Vitals:   12/23/20 0843  PainSc: 0-No pain         Complications: No notable events documented.

## 2020-12-23 NOTE — Anesthesia Postprocedure Evaluation (Signed)
Anesthesia Post Note  Patient: Jon Williams  Procedure(s) Performed: INSERTION PORT-A-CATH (Right: Chest)     Patient location during evaluation: PACU Anesthesia Type: General Level of consciousness: awake and alert Pain management: pain level controlled Vital Signs Assessment: post-procedure vital signs reviewed and stable Respiratory status: spontaneous breathing, nonlabored ventilation, respiratory function stable and patient connected to nasal cannula oxygen Cardiovascular status: blood pressure returned to baseline and stable Postop Assessment: no apparent nausea or vomiting Anesthetic complications: no   No notable events documented.  Last Vitals:  Vitals:   12/23/20 1300 12/23/20 1315  BP: 123/86 134/86  Pulse: 67 64  Resp: 16 (!) 23  Temp:  (!) 36.1 C  SpO2: 100% 100%    Last Pain:  Vitals:   12/23/20 1315  PainSc: 0-No pain                 Barnet Glasgow

## 2020-12-23 NOTE — Op Note (Signed)
Date: 12/23/20  Patient: Jon Williams MRN: [id number]  Preoperative Diagnosis: Gastric adenocarcinoma Postoperative Diagnosis: Same  Procedure: Port-a-cath insertion  Surgeon: Michaelle Birks, MD  EBL: Minimal  Anesthesia: General LMA  Specimens: None  Indications: Jon Williams is a 70 yo male who presented with anemia and fatigue and was found to have a gastric mass. Biopsy confirmed adenocarcinoma, staged as T3N0 on EUS. He presents for port placement for neoadjuvant chemotherapy.  Findings: 8-Fr single-lumen power port placed via the right subclavian vein under fluoroscopic guidance. Total catheter length of 16.5 cm.  Procedure details: Informed consent was obtained in the preoperative area prior to the procedure. The patient was brought to the operating room and placed on the table in the supine position. General anesthesia was induced and appropriate lines and drains were placed for intraoperative monitoring. Perioperative antibiotics were administered per SCIP guidelines. The right chest and neck were prepped and draped in the usual sterile fashion. A pre-procedure timeout was taken verifying patient identity, surgical site and procedure to be performed.  The patient was placed in Trendelenberg and the right subclavian vein was accessed with a large-bore needle. A guidewire was inserted and advanced, and position in the SVC was confirmed fluoroscopically. The needle was removed and the wire was clipped to the drapes to secure its position. Next a place for the port was chosen on the upper lateral right chest and the skin was infiltrated with 0.25% bupivicaine. A small skin incision was made and a subcutaneous pocket was created with cautery. The port and catheter were then flushed and brought onto the field. Three 2-0 prolene sutures were used to secure the port in the subcutaneous pocket, but the sutures were not tied down. The port was placed in the pocket and the attached cathether  was tunneled beneath the skin to the wire exit site. The catheter was then measured using fluoro - it was placed over the skin adjacent to the guidewire, and marked externally at the cavoatrial junction. The catheter was then cut at this location, which was at 16.5 cm. The dilator and sheath were then advanced over the guidewire under fluoroscopic guidance, and the wire and dilator were removed. The end of the catheter was inserted through the sheath and advanced, and the sheath was peeled away. The port was then accessed with a Huber needle, and blood was aspirated and the port was flushed with heparinized saline. A final fluoroscopic image confirmed appropriate position of the catheter tip within the SVC, without kinking of the catheter. The prolene sutures were tied down. A final flush of 500 units heparin (100 units/mL) was given via the port. The skin was closed with a deep dermal layer of interrupted 3-0 Vicryl suture, followed by a running subcuticular 4-0 monocryl suture. Dermabond was applied.  The patient tolerated the procedure with no apparent complications. All counts were correct x2 at the end of the procedure. The patient was extubated and taken to PACU in stable condition.  Michaelle Birks, MD 12/23/20 12:12 PM

## 2020-12-23 NOTE — Progress Notes (Signed)
MD Houser notified that pt has ICD. Magnet okay.

## 2020-12-23 NOTE — Discharge Instructions (Signed)
SURGERY DISCHARGE INSTRUCTIONS: PORT-A-CATH PLACEMENT  Activity You may resume your usual activities as tolerated Ok to shower in 24 hours, but do not bathe or submerge incision underwater. Do not drive while taking narcotic pain medication.  Wound Care Your incision is covered with skin glue called Dermabond. This will peel off on its own over time. You may shower and allow warm soapy water to run over your incisions. Gently pat dry. Do not submerge your incision underwater. Monitor your incision for any new redness, tenderness, or drainage. You may start using your port in 48 hours. Do not apply EMLA cream directly over the Dermabond (skin glue).  When to Call Us: Fever greater than 100.5 New redness, drainage, or swelling at incision site Severe pain, nausea, or vomiting Shortness of breath, difficulty breathing  For questions or concerns, please call the [facility] office at [phone].  

## 2020-12-24 ENCOUNTER — Encounter (HOSPITAL_COMMUNITY): Payer: Self-pay | Admitting: Surgery

## 2020-12-26 NOTE — Progress Notes (Signed)
Pharmacist Chemotherapy Monitoring - Initial Assessment    Anticipated start date: 01/02/21   The following has been reviewed per standard work regarding the patient's treatment regimen: The patient's diagnosis, treatment plan and drug doses, and organ/hematologic function Lab orders and baseline tests specific to treatment regimen  The treatment plan start date, drug sequencing, and pre-medications Prior authorization status  Patient's documented medication list, including drug-drug interaction screen and prescriptions for anti-emetics and supportive care specific to the treatment regimen The drug concentrations, fluid compatibility, administration routes, and timing of the medications to be used The patient's access for treatment and lifetime cumulative dose history, if applicable  The patient's medication allergies and previous infusion related reactions, if applicable   Changes made to treatment plan:  N/A  Follow up needed:  N/A   Larene Beach, RPH, 12/26/2020  9:04 AM

## 2020-12-27 ENCOUNTER — Other Ambulatory Visit: Payer: Self-pay | Admitting: Internal Medicine

## 2020-12-27 DIAGNOSIS — I4891 Unspecified atrial fibrillation: Secondary | ICD-10-CM

## 2020-12-27 NOTE — Telephone Encounter (Signed)
Xarelto was discontinued on hospital discharge on 12/02/2020 by Dr. Starla Link Kshitiz-Hospitalist. Per discharge summary it states "He will be discharged home today off of Xarelto with close outpatient follow-up with PCP/oncology/general surgery." & "Paroxysmal A. Fib Rate controlled.  Continue sotalol and Coreg.  We will keep Xarelto on hold."  Also, pt saw [place] PA on 12/06/2020 and per his OV note, "Xarelto on hold with GI bleed and GI malignancy, like is a poor candidate for resumption despite CHA2DS2/VASc of at least 4 (probably higher but un-definable with malignancy)" and "We discussed stroke and bleeding risk at length at today's visit. With shared decision making (and at GI recommendation in chart) have decided to remain off [place] until his GI malignancy is managed. He understands he is at a risk of stroke off of [place]."   Will not send due to Xarelto is on hold.

## 2020-12-28 ENCOUNTER — Inpatient Hospital Stay: Payer: Medicare HMO

## 2020-12-28 ENCOUNTER — Other Ambulatory Visit: Payer: Self-pay

## 2020-12-28 ENCOUNTER — Encounter: Payer: Self-pay | Admitting: Hematology

## 2020-12-28 NOTE — Progress Notes (Signed)
Met with patient at registration to introduce myself as Arboriculturist and to offer available resources.  Discussed one-time $1000 Radio broadcast assistant to assist with personal expenses while going through treatment.  Gave him my card if interested in applying and for any additional financial questions or concerns.

## 2020-12-29 LAB — SURGICAL PATHOLOGY

## 2020-12-30 MED FILL — Dexamethasone Sodium Phosphate Inj 100 MG/10ML: INTRAMUSCULAR | Qty: 1 | Status: AC

## 2021-01-02 ENCOUNTER — Inpatient Hospital Stay: Payer: Medicare HMO

## 2021-01-02 ENCOUNTER — Other Ambulatory Visit: Payer: Self-pay

## 2021-01-02 VITALS — BP 106/80 | HR 61 | Temp 97.7°F | Resp 16 | Wt 208.0 lb

## 2021-01-02 DIAGNOSIS — C163 Malignant neoplasm of pyloric antrum: Secondary | ICD-10-CM

## 2021-01-02 DIAGNOSIS — R197 Diarrhea, unspecified: Secondary | ICD-10-CM | POA: Diagnosis not present

## 2021-01-02 DIAGNOSIS — D500 Iron deficiency anemia secondary to blood loss (chronic): Secondary | ICD-10-CM

## 2021-01-02 DIAGNOSIS — D508 Other iron deficiency anemias: Secondary | ICD-10-CM | POA: Diagnosis not present

## 2021-01-02 DIAGNOSIS — Z5189 Encounter for other specified aftercare: Secondary | ICD-10-CM | POA: Diagnosis not present

## 2021-01-02 DIAGNOSIS — Z5111 Encounter for antineoplastic chemotherapy: Secondary | ICD-10-CM | POA: Diagnosis not present

## 2021-01-02 DIAGNOSIS — C169 Malignant neoplasm of stomach, unspecified: Secondary | ICD-10-CM | POA: Diagnosis not present

## 2021-01-02 LAB — CBC WITH DIFFERENTIAL/PLATELET
Abs Immature Granulocytes: 0.01 10*3/uL (ref 0.00–0.07)
Basophils Absolute: 0 10*3/uL (ref 0.0–0.1)
Basophils Relative: 0 %
Eosinophils Absolute: 0.2 10*3/uL (ref 0.0–0.5)
Eosinophils Relative: 3 %
HCT: 34.6 % — ABNORMAL LOW (ref 39.0–52.0)
Hemoglobin: 10.9 g/dL — ABNORMAL LOW (ref 13.0–17.0)
Immature Granulocytes: 0 %
Lymphocytes Relative: 14 %
Lymphs Abs: 0.9 10*3/uL (ref 0.7–4.0)
MCH: 24.8 pg — ABNORMAL LOW (ref 26.0–34.0)
MCHC: 31.5 g/dL (ref 30.0–36.0)
MCV: 78.6 fL — ABNORMAL LOW (ref 80.0–100.0)
Monocytes Absolute: 0.5 10*3/uL (ref 0.1–1.0)
Monocytes Relative: 9 %
Neutro Abs: 4.7 10*3/uL (ref 1.7–7.7)
Neutrophils Relative %: 74 %
Platelets: 204 10*3/uL (ref 150–400)
RBC: 4.4 MIL/uL (ref 4.22–5.81)
RDW: 25.5 % — ABNORMAL HIGH (ref 11.5–15.5)
WBC: 6.4 10*3/uL (ref 4.0–10.5)
nRBC: 0 % (ref 0.0–0.2)

## 2021-01-02 LAB — COMPREHENSIVE METABOLIC PANEL
ALT: 14 U/L (ref 0–44)
AST: 23 U/L (ref 15–41)
Albumin: 4.2 g/dL (ref 3.5–5.0)
Alkaline Phosphatase: 48 U/L (ref 38–126)
Anion gap: 8 (ref 5–15)
BUN: 24 mg/dL — ABNORMAL HIGH (ref 8–23)
CO2: 25 mmol/L (ref 22–32)
Calcium: 9.2 mg/dL (ref 8.9–10.3)
Chloride: 105 mmol/L (ref 98–111)
Creatinine, Ser: 1.19 mg/dL (ref 0.61–1.24)
GFR, Estimated: >60 mL/min (ref >60)
Glucose, Bld: 99 mg/dL (ref 70–99)
Potassium: 3.9 mmol/L (ref 3.5–5.1)
Sodium: 138 mmol/L (ref 135–145)
Total Bilirubin: 0.7 mg/dL (ref 0.3–1.2)
Total Protein: 7.3 g/dL (ref 6.5–8.1)

## 2021-01-02 LAB — CEA (IN HOUSE-CHCC): CEA (CHCC-In House): 1.96 ng/mL (ref 0.00–5.00)

## 2021-01-02 LAB — IRON AND TIBC
Iron: 82 ug/dL (ref 42–163)
Saturation Ratios: 25 % (ref 20–55)
TIBC: 326 ug/dL (ref 202–409)
UIBC: 244 ug/dL (ref 117–376)

## 2021-01-02 LAB — FERRITIN: Ferritin: 150 ng/mL (ref 24–336)

## 2021-01-02 MED ORDER — PALONOSETRON HCL INJECTION 0.25 MG/5ML
0.2500 mg | Freq: Once | INTRAVENOUS | Status: AC
  Administered 2021-01-02: 0.25 mg via INTRAVENOUS
  Filled 2021-01-02: qty 5

## 2021-01-02 MED ORDER — OXALIPLATIN CHEMO INJECTION 100 MG/20ML
70.0000 mg/m2 | Freq: Once | INTRAVENOUS | Status: AC
  Administered 2021-01-02: 145 mg via INTRAVENOUS
  Filled 2021-01-02: qty 20

## 2021-01-02 MED ORDER — SODIUM CHLORIDE 0.9 % IV SOLN
40.0000 mg/m2 | Freq: Once | INTRAVENOUS | Status: AC
  Administered 2021-01-02: 80 mg via INTRAVENOUS
  Filled 2021-01-02: qty 8

## 2021-01-02 MED ORDER — SODIUM CHLORIDE 0.9 % IV SOLN
10.0000 mg | Freq: Once | INTRAVENOUS | Status: AC
  Administered 2021-01-02: 10 mg via INTRAVENOUS
  Filled 2021-01-02: qty 10

## 2021-01-02 MED ORDER — LEUCOVORIN CALCIUM INJECTION 350 MG
200.0000 mg/m2 | Freq: Once | INTRAVENOUS | Status: AC
  Administered 2021-01-02: 420 mg via INTRAVENOUS
  Filled 2021-01-02: qty 21

## 2021-01-02 MED ORDER — DEXTROSE 5 % IV SOLN: Freq: Once | INTRAVENOUS | Status: AC

## 2021-01-02 MED ORDER — SODIUM CHLORIDE 0.9 % IV SOLN
2380.0000 mg/m2 | INTRAVENOUS | Status: DC
  Administered 2021-01-02: 5000 mg via INTRAVENOUS
  Filled 2021-01-02: qty 100

## 2021-01-02 MED ORDER — SODIUM CHLORIDE 0.9% FLUSH
10.0000 mL | Freq: Once | INTRAVENOUS | Status: AC | PRN
  Administered 2021-01-02: 10 mL

## 2021-01-02 MED ORDER — SODIUM CHLORIDE 0.9 % IV SOLN: 2400.0000 mg/m2 | INTRAVENOUS | Status: DC

## 2021-01-02 NOTE — Progress Notes (Signed)
Nutrition Assessment   Reason for Assessment:   Patient identified on Malnutrition Screening report for weight loss   ASSESSMENT:  70 year old male with gastric cancer.  Past medical history of DM, afib, severe cardiomyopathy, ICD placement, prostate cancer.  Patient receiving chemotherapy.    Met with patient during infusion.  Patient reports that he has a good appetite and has been eating baked chicken and fish.  He has been trying to eat fruits and vegetables as well.  Says that he is exercising some with his wife.  Drinking boost shake one time per day and trying to drink water.  Says that after he took the antibiotics his appetite is better and he is eating better.      Medications: compazine, MVOI, zofran, protonic, Vit D, dexamethasone, lasix, metformin, KCL   Labs: reviewed   Anthropometrics:   Height: 66 inches Weight: 208 lb 11/21 11/11 202 lb 10/20 210 lb 7/29 201 lb BMI: 32   Estimated Energy Needs  Kcals: 2350-2800 Protein: 118-140 g Fluid: 2.3 L   NUTRITION DIAGNOSIS: Inadequate oral intake previously but currently eating well and gaining weight.   INTERVENTION:  Discussed importance of weight maintenance during treatment. Encouraged patient to eat source of protein at every meal.  Discussed examples of foods high in protein.  Encouraged patient to continue boost shakes. Contact information given   MONITORING, EVALUATION, GOAL: weight trends, intake   Next Visit: to be determined with treatment  Felisha Claytor B. Zenia Resides, [place], [place] Registered Dietitian [phone] (mobile)

## 2021-01-02 NOTE — Patient Instructions (Signed)
[facility]   Discharge Instructions: Thank you for choosing [facility] to provide your oncology and hematology care.   If you have a lab appointment with the [facility], please go directly to the [facility] and check in at the registration area.   Wear comfortable clothing and clothing appropriate for easy access to any Portacath or PICC line.   We strive to give you quality time with your provider. You may need to reschedule your appointment if you arrive late (15 or more minutes).  Arriving late affects you and other patients whose appointments are after yours.  Also, if you miss three or more appointments without notifying the office, you may be dismissed from the clinic at the provider's discretion.      For prescription refill requests, have your pharmacy contact our office and allow 72 hours for refills to be completed.    Today you received the following chemotherapy and/or immunotherapy agents: docetaxel, oxaliplatin, leucovorin, fluoruracil.      To help prevent nausea and vomiting after your treatment, we encourage you to take your nausea medication as directed.  BELOW ARE SYMPTOMS THAT SHOULD BE REPORTED IMMEDIATELY: *FEVER GREATER THAN 100.4 F (38 C) OR HIGHER *CHILLS OR SWEATING *NAUSEA AND VOMITING THAT IS NOT CONTROLLED WITH YOUR NAUSEA MEDICATION *UNUSUAL SHORTNESS OF BREATH *UNUSUAL BRUISING OR BLEEDING *URINARY PROBLEMS (pain or burning when urinating, or frequent urination) *BOWEL PROBLEMS (unusual diarrhea, constipation, pain near the anus) TENDERNESS IN MOUTH AND THROAT WITH OR WITHOUT PRESENCE OF ULCERS (sore throat, sores in mouth, or a toothache) UNUSUAL RASH, SWELLING OR PAIN  UNUSUAL VAGINAL DISCHARGE OR ITCHING   Items with * indicate a potential emergency and should be followed up as soon as possible or go to the Emergency Department if any problems should occur.  Please show the CHEMOTHERAPY ALERT CARD or  IMMUNOTHERAPY ALERT CARD at check-in to the Emergency Department and triage nurse.  Should you have questions after your visit or need to cancel or reschedule your appointment, please contact [facility]  Dept: [phone]  and follow the prompts.  Office hours are 8:00 a.m. to 4:30 p.m. Monday - Friday. Please note that voicemails left after 4:00 p.m. may not be returned until the following business day.  We are closed weekends and major holidays. You have access to a nurse at all times for urgent questions. Please call the main number to the clinic Dept: [phone] and follow the prompts.   For any non-urgent questions, you may also contact your provider using MyChart. We now offer e-Visits for anyone 54 and older to request care online for non-urgent symptoms. For details visit [email or web address].   Also download the MyChart app! Go to the app store, search "MyChart", open the app, select [facility], and log in with your MyChart username and password.  Due to Covid, a mask is required upon entering the hospital/clinic. If you do not have a mask, one will be given to you upon arrival. For doctor visits, patients may have 1 support person aged 45 or older with them. For treatment visits, patients cannot have anyone with them due to current Covid guidelines and our immunocompromised population.   Docetaxel injection What is this medication? DOCETAXEL (doe se TAX el) is a chemotherapy drug. It targets fast dividing cells, like cancer cells, and causes these cells to die. This medicine is used to treat many types of cancers like breast cancer, certain stomach cancers,  head and neck cancer, lung cancer, and prostate cancer. This medicine may be used for other purposes; ask your health care provider or pharmacist if you have questions. COMMON BRAND NAME(S): Docefrez, Taxotere What should I tell my care team before I take this medication? They need to know if you  have any of these conditions: infection (especially a virus infection such as chickenpox, cold sores, or herpes) liver disease low blood counts, like low white cell, platelet, or red cell counts an unusual or allergic reaction to docetaxel, polysorbate 80, other chemotherapy agents, other medicines, foods, dyes, or preservatives pregnant or trying to get pregnant breast-feeding How should I use this medication? This drug is given as an infusion into a vein. It is administered in a hospital or clinic by a specially trained health care professional. Talk to your pediatrician regarding the use of this medicine in children. Special care may be needed. Overdosage: If you think you have taken too much of this medicine contact a poison control center or emergency room at once. NOTE: This medicine is only for you. Do not share this medicine with others. What if I miss a dose? It is important not to miss your dose. Call your doctor or health care professional if you are unable to keep an appointment. What may interact with this medication? Do not take this medicine with any of the following medications: live virus vaccines This medicine may also interact with the following medications: aprepitant certain antibiotics like erythromycin or clarithromycin certain antivirals for HIV or hepatitis certain medicines for fungal infections like fluconazole, itraconazole, ketoconazole, posaconazole, or voriconazole cimetidine ciprofloxacin conivaptan cyclosporine dronedarone fluvoxamine grapefruit juice imatinib verapamil This list may not describe all possible interactions. Give your health care provider a list of all the medicines, herbs, non-prescription drugs, or dietary supplements you use. Also tell them if you smoke, drink alcohol, or use illegal drugs. Some items may interact with your medicine. What should I watch for while using this medication? Your condition will be monitored carefully while  you are receiving this medicine. You will need important blood work done while you are taking this medicine. Call your doctor or health care professional for advice if you get a fever, chills or sore throat, or other symptoms of a cold or flu. Do not treat yourself. This drug decreases your body's ability to fight infections. Try to avoid being around people who are sick. Some products may contain alcohol. Ask your health care professional if this medicine contains alcohol. Be sure to tell all health care professionals you are taking this medicine. Certain medicines, like metronidazole and disulfiram, can cause an unpleasant reaction when taken with alcohol. The reaction includes flushing, headache, nausea, vomiting, sweating, and increased thirst. The reaction can last from 30 minutes to several hours. You may get drowsy or dizzy. Do not drive, use machinery, or do anything that needs mental alertness until you know how this medicine affects you. Do not stand or sit up quickly, especially if you are an older patient. This reduces the risk of dizzy or fainting spells. Alcohol may interfere with the effect of this medicine. Talk to your health care professional about your risk of cancer. You may be more at risk for certain types of cancer if you take this medicine. Do not become pregnant while taking this medicine or for 6 months after stopping it. Women should inform their doctor if they wish to become pregnant or think they might be pregnant. There is a potential for  serious side effects to an unborn child. Talk to your health care professional or pharmacist for more information. Do not breast-feed an infant while taking this medicine or for 1 week after stopping it. Males who get this medicine must use a condom during sex with females who can get pregnant. If you get a woman pregnant, the baby could have birth defects. The baby could die before they are born. You will need to continue wearing a condom for 3  months after stopping the medicine. Tell your health care provider right away if your partner becomes pregnant while you are taking this medicine. This may interfere with the ability to father a child. You should talk to your doctor or health care professional if you are concerned about your fertility. What side effects may I notice from receiving this medication? Side effects that you should report to your doctor or health care professional as soon as possible: allergic reactions like skin rash, itching or hives, swelling of the face, lips, or tongue blurred vision breathing problems changes in vision low blood counts - This drug may decrease the number of white blood cells, red blood cells and platelets. You may be at increased risk for infections and bleeding. nausea and vomiting pain, redness or irritation at site where injected pain, tingling, numbness in the hands or feet redness, blistering, peeling, or loosening of the skin, including inside the mouth signs of decreased platelets or bleeding - bruising, pinpoint red spots on the skin, black, tarry stools, nosebleeds signs of decreased red blood cells - unusually weak or tired, fainting spells, lightheadedness signs of infection - fever or chills, cough, sore throat, pain or difficulty passing urine swelling of the ankle, feet, hands Side effects that usually do not require medical attention (report to your doctor or health care professional if they continue or are bothersome): constipation diarrhea fingernail or toenail changes hair loss loss of appetite mouth sores muscle pain This list may not describe all possible side effects. Call your doctor for medical advice about side effects. You may report side effects to FDA at 1-800-FDA-1088. Where should I keep my medication? This drug is given in a hospital or clinic and will not be stored at home. NOTE: This sheet is a summary. It may not cover all possible information. If you have  questions about this medicine, talk to your doctor, pharmacist, or health care provider.  2022 Elsevier/Gold Standard (2020-10-18 00:00:00)  Oxaliplatin Injection What is this medication? OXALIPLATIN (ox AL i PLA tin) is a chemotherapy drug. It targets fast dividing cells, like cancer cells, and causes these cells to die. This medicine is used to treat cancers of the colon and rectum, and many other cancers. This medicine may be used for other purposes; ask your health care provider or pharmacist if you have questions. COMMON BRAND NAME(S): Eloxatin What should I tell my care team before I take this medication? They need to know if you have any of these conditions: heart disease history of irregular heartbeat liver disease low blood counts, like white cells, platelets, or red blood cells lung or breathing disease, like asthma take medicines that treat or prevent blood clots tingling of the fingers or toes, or other nerve disorder an unusual or allergic reaction to oxaliplatin, other chemotherapy, other medicines, foods, dyes, or preservatives pregnant or trying to get pregnant breast-feeding How should I use this medication? This drug is given as an infusion into a vein. It is administered in a hospital or clinic by  a specially trained health care professional. Talk to your pediatrician regarding the use of this medicine in children. Special care may be needed. Overdosage: If you think you have taken too much of this medicine contact a poison control center or emergency room at once. NOTE: This medicine is only for you. Do not share this medicine with others. What if I miss a dose? It is important not to miss a dose. Call your doctor or health care professional if you are unable to keep an appointment. What may interact with this medication? Do not take this medicine with any of the following medications: cisapride dronedarone pimozide thioridazine This medicine may also interact  with the following medications: aspirin and aspirin-like medicines certain medicines that treat or prevent blood clots like warfarin, apixaban, dabigatran, and rivaroxaban cisplatin cyclosporine diuretics medicines for infection like acyclovir, adefovir, amphotericin B, bacitracin, cidofovir, foscarnet, ganciclovir, gentamicin, pentamidine, vancomycin NSAIDs, medicines for pain and inflammation, like ibuprofen or naproxen other medicines that prolong the QT interval (an abnormal heart rhythm) pamidronate zoledronic acid This list may not describe all possible interactions. Give your health care provider a list of all the medicines, herbs, non-prescription drugs, or dietary supplements you use. Also tell them if you smoke, drink alcohol, or use illegal drugs. Some items may interact with your medicine. What should I watch for while using this medication? Your condition will be monitored carefully while you are receiving this medicine. You may need blood work done while you are taking this medicine. This medicine may make you feel generally unwell. This is not uncommon as chemotherapy can affect healthy cells as well as cancer cells. Report any side effects. Continue your course of treatment even though you feel ill unless your healthcare professional tells you to stop. This medicine can make you more sensitive to cold. Do not drink cold drinks or use ice. Cover exposed skin before coming in contact with cold temperatures or cold objects. When out in cold weather wear warm clothing and cover your mouth and nose to warm the air that goes into your lungs. Tell your doctor if you get sensitive to the cold. Do not become pregnant while taking this medicine or for 9 months after stopping it. Women should inform their health care professional if they wish to become pregnant or think they might be pregnant. Men should not father a child while taking this medicine and for 6 months after stopping it. There is  potential for serious side effects to an unborn child. Talk to your health care professional for more information. Do not breast-feed a child while taking this medicine or for 3 months after stopping it. This medicine has caused ovarian failure in some women. This medicine may make it more difficult to get pregnant. Talk to your health care professional if you are concerned about your fertility. This medicine has caused decreased sperm counts in some men. This may make it more difficult to father a child. Talk to your health care professional if you are concerned about your fertility. This medicine may increase your risk of getting an infection. Call your health care professional for advice if you get a fever, chills, or sore throat, or other symptoms of a cold or flu. Do not treat yourself. Try to avoid being around people who are sick. Avoid taking medicines that contain aspirin, acetaminophen, ibuprofen, naproxen, or ketoprofen unless instructed by your health care professional. These medicines may hide a fever. Be careful brushing or flossing your teeth or using a  toothpick because you may get an infection or bleed more easily. If you have any dental work done, tell your dentist you are receiving this medicine. What side effects may I notice from receiving this medication? Side effects that you should report to your doctor or health care professional as soon as possible: allergic reactions like skin rash, itching or hives, swelling of the face, lips, or tongue breathing problems cough low blood counts - this medicine may decrease the number of white blood cells, red blood cells, and platelets. You may be at increased risk for infections and bleeding nausea, vomiting pain, redness, or irritation at site where injected pain, tingling, numbness in the hands or feet signs and symptoms of bleeding such as bloody or black, tarry stools; red or dark brown urine; spitting up blood or brown material that  looks like coffee grounds; red spots on the skin; unusual bruising or bleeding from the eyes, gums, or nose signs and symptoms of a dangerous change in heartbeat or heart rhythm like chest pain; dizziness; fast, irregular heartbeat; palpitations; feeling faint or lightheaded; falls signs and symptoms of infection like fever; chills; cough; sore throat; pain or trouble passing urine signs and symptoms of liver injury like dark yellow or brown urine; general ill feeling or flu-like symptoms; light-colored stools; loss of appetite; nausea; right upper belly pain; unusually weak or tired; yellowing of the eyes or skin signs and symptoms of low red blood cells or anemia such as unusually weak or tired; feeling faint or lightheaded; falls signs and symptoms of muscle injury like dark urine; trouble passing urine or change in the amount of urine; unusually weak or tired; muscle pain; back pain Side effects that usually do not require medical attention (report to your doctor or health care professional if they continue or are bothersome): changes in taste diarrhea gas hair loss loss of appetite mouth sores This list may not describe all possible side effects. Call your doctor for medical advice about side effects. You may report side effects to FDA at 1-800-FDA-1088. Where should I keep my medication? This drug is given in a hospital or clinic and will not be stored at home. NOTE: This sheet is a summary. It may not cover all possible information. If you have questions about this medicine, talk to your doctor, pharmacist, or health care provider.  2022 Elsevier/Gold Standard (2020-10-18 00:00:00)  Leucovorin injection What is this medication? LEUCOVORIN (loo koe VOR in) is used to prevent or treat the harmful effects of some medicines. This medicine is used to treat anemia caused by a low amount of folic acid in the body. It is also used with 5-fluorouracil (5-FU) to treat colon cancer. This medicine  may be used for other purposes; ask your health care provider or pharmacist if you have questions. What should I tell my care team before I take this medication? They need to know if you have any of these conditions: anemia from low levels of vitamin B-12 in the blood an unusual or allergic reaction to leucovorin, folic acid, other medicines, foods, dyes, or preservatives pregnant or trying to get pregnant breast-feeding How should I use this medication? This medicine is for injection into a muscle or into a vein. It is given by a health care professional in a hospital or clinic setting. Talk to your pediatrician regarding the use of this medicine in children. Special care may be needed. Overdosage: If you think you have taken too much of this medicine contact a poison  control center or emergency room at once. NOTE: This medicine is only for you. Do not share this medicine with others. What if I miss a dose? This does not apply. What may interact with this medication? capecitabine fluorouracil phenobarbital phenytoin primidone trimethoprim-sulfamethoxazole This list may not describe all possible interactions. Give your health care provider a list of all the medicines, herbs, non-prescription drugs, or dietary supplements you use. Also tell them if you smoke, drink alcohol, or use illegal drugs. Some items may interact with your medicine. What should I watch for while using this medication? Your condition will be monitored carefully while you are receiving this medicine. This medicine may increase the side effects of 5-fluorouracil, 5-FU. Tell your doctor or health care professional if you have diarrhea or mouth sores that do not get better or that get worse. What side effects may I notice from receiving this medication? Side effects that you should report to your doctor or health care professional as soon as possible: allergic reactions like skin rash, itching or hives, swelling of the  face, lips, or tongue breathing problems fever, infection mouth sores unusual bleeding or bruising unusually weak or tired Side effects that usually do not require medical attention (report to your doctor or health care professional if they continue or are bothersome): constipation or diarrhea loss of appetite nausea, vomiting This list may not describe all possible side effects. Call your doctor for medical advice about side effects. You may report side effects to FDA at 1-800-FDA-1088. Where should I keep my medication? This drug is given in a hospital or clinic and will not be stored at home. NOTE: This sheet is a summary. It may not cover all possible information. If you have questions about this medicine, talk to your doctor, pharmacist, or health care provider.  2022 Elsevier/Gold Standard (2007-08-07 00:00:00)  Fluorouracil, 5-FU injection What is this medication? FLUOROURACIL, 5-FU (flure oh YOOR a sil) is a chemotherapy drug. It slows the growth of cancer cells. This medicine is used to treat many types of cancer like breast cancer, colon or rectal cancer, pancreatic cancer, and stomach cancer. This medicine may be used for other purposes; ask your health care provider or pharmacist if you have questions. COMMON BRAND NAME(S): Adrucil What should I tell my care team before I take this medication? They need to know if you have any of these conditions: blood disorders dihydropyrimidine dehydrogenase (DPD) deficiency infection (especially a virus infection such as chickenpox, cold sores, or herpes) kidney disease liver disease malnourished, poor nutrition recent or ongoing radiation therapy an unusual or allergic reaction to fluorouracil, other chemotherapy, other medicines, foods, dyes, or preservatives pregnant or trying to get pregnant breast-feeding How should I use this medication? This drug is given as an infusion or injection into a vein. It is administered in a  hospital or clinic by a specially trained health care professional. Talk to your pediatrician regarding the use of this medicine in children. Special care may be needed. Overdosage: If you think you have taken too much of this medicine contact a poison control center or emergency room at once. NOTE: This medicine is only for you. Do not share this medicine with others. What if I miss a dose? It is important not to miss your dose. Call your doctor or health care professional if you are unable to keep an appointment. What may interact with this medication? Do not take this medicine with any of the following medications: live virus vaccines This medicine may  also interact with the following medications: medicines that treat or prevent blood clots like warfarin, enoxaparin, and dalteparin This list may not describe all possible interactions. Give your health care provider a list of all the medicines, herbs, non-prescription drugs, or dietary supplements you use. Also tell them if you smoke, drink alcohol, or use illegal drugs. Some items may interact with your medicine. What should I watch for while using this medication? Visit your doctor for checks on your progress. This drug may make you feel generally unwell. This is not uncommon, as chemotherapy can affect healthy cells as well as cancer cells. Report any side effects. Continue your course of treatment even though you feel ill unless your doctor tells you to stop. In some cases, you may be given additional medicines to help with side effects. Follow all directions for their use. Call your doctor or health care professional for advice if you get a fever, chills or sore throat, or other symptoms of a cold or flu. Do not treat yourself. This drug decreases your body's ability to fight infections. Try to avoid being around people who are sick. This medicine may increase your risk to bruise or bleed. Call your doctor or health care professional if you  notice any unusual bleeding. Be careful brushing and flossing your teeth or using a toothpick because you may get an infection or bleed more easily. If you have any dental work done, tell your dentist you are receiving this medicine. Avoid taking products that contain aspirin, acetaminophen, ibuprofen, naproxen, or ketoprofen unless instructed by your doctor. These medicines may hide a fever. Do not become pregnant while taking this medicine. Women should inform their doctor if they wish to become pregnant or think they might be pregnant. There is a potential for serious side effects to an unborn child. Talk to your health care professional or pharmacist for more information. Do not breast-feed an infant while taking this medicine. Men should inform their doctor if they wish to father a child. This medicine may lower sperm counts. Do not treat diarrhea with over the counter products. Contact your doctor if you have diarrhea that lasts more than 2 days or if it is severe and watery. This medicine can make you more sensitive to the sun. Keep out of the sun. If you cannot avoid being in the sun, wear protective clothing and use sunscreen. Do not use sun lamps or tanning beds/booths. What side effects may I notice from receiving this medication? Side effects that you should report to your doctor or health care professional as soon as possible: allergic reactions like skin rash, itching or hives, swelling of the face, lips, or tongue low blood counts - this medicine may decrease the number of white blood cells, red blood cells and platelets. You may be at increased risk for infections and bleeding. signs of infection - fever or chills, cough, sore throat, pain or difficulty passing urine signs of decreased platelets or bleeding - bruising, pinpoint red spots on the skin, black, tarry stools, blood in the urine signs of decreased red blood cells - unusually weak or tired, fainting spells,  lightheadedness breathing problems changes in vision chest pain mouth sores nausea and vomiting pain, swelling, redness at site where injected pain, tingling, numbness in the hands or feet redness, swelling, or sores on hands or feet stomach pain unusual bleeding Side effects that usually do not require medical attention (report to your doctor or health care professional if they continue or are bothersome):  changes in finger or toe nails diarrhea dry or itchy skin hair loss headache loss of appetite sensitivity of eyes to the light stomach upset unusually teary eyes This list may not describe all possible side effects. Call your doctor for medical advice about side effects. You may report side effects to FDA at 1-800-FDA-1088. Where should I keep my medication? This drug is given in a hospital or clinic and will not be stored at home. NOTE: This sheet is a summary. It may not cover all possible information. If you have questions about this medicine, talk to your doctor, pharmacist, or health care provider.  2022 Elsevier/Gold Standard (2020-10-18 00:00:00)

## 2021-01-03 ENCOUNTER — Ambulatory Visit: Payer: Medicare HMO | Admitting: Hematology

## 2021-01-03 ENCOUNTER — Other Ambulatory Visit: Payer: Medicare HMO

## 2021-01-03 ENCOUNTER — Inpatient Hospital Stay: Payer: Medicare HMO

## 2021-01-03 VITALS — BP 136/80 | HR 74 | Temp 98.8°F | Resp 16

## 2021-01-03 DIAGNOSIS — Z5189 Encounter for other specified aftercare: Secondary | ICD-10-CM | POA: Diagnosis not present

## 2021-01-03 DIAGNOSIS — D508 Other iron deficiency anemias: Secondary | ICD-10-CM | POA: Diagnosis not present

## 2021-01-03 DIAGNOSIS — Z5111 Encounter for antineoplastic chemotherapy: Secondary | ICD-10-CM | POA: Diagnosis not present

## 2021-01-03 DIAGNOSIS — R197 Diarrhea, unspecified: Secondary | ICD-10-CM | POA: Diagnosis not present

## 2021-01-03 DIAGNOSIS — C163 Malignant neoplasm of pyloric antrum: Secondary | ICD-10-CM

## 2021-01-03 DIAGNOSIS — Z95828 Presence of other vascular implants and grafts: Secondary | ICD-10-CM

## 2021-01-03 DIAGNOSIS — C169 Malignant neoplasm of stomach, unspecified: Secondary | ICD-10-CM | POA: Diagnosis not present

## 2021-01-03 MED ORDER — HEPARIN SOD (PORK) LOCK FLUSH 100 UNIT/ML IV SOLN
500.0000 [IU] | Freq: Once | INTRAVENOUS | Status: AC
  Administered 2021-01-03: 500 [IU] via INTRAVENOUS

## 2021-01-03 MED ORDER — PEGFILGRASTIM 6 MG/0.6ML ~~LOC~~ PSKT
6.0000 mg | PREFILLED_SYRINGE | Freq: Once | SUBCUTANEOUS | Status: AC
  Administered 2021-01-03: 6 mg via SUBCUTANEOUS
  Filled 2021-01-03: qty 0.6

## 2021-01-03 MED ORDER — SODIUM CHLORIDE 0.9% FLUSH
10.0000 mL | Freq: Once | INTRAVENOUS | Status: AC
  Administered 2021-01-03: 10 mL via INTRAVENOUS

## 2021-01-06 ENCOUNTER — Emergency Department (HOSPITAL_COMMUNITY)
Admission: EM | Admit: 2021-01-06 | Discharge: 2021-01-06 | Disposition: A | Discharge status: Home / Self Care | Payer: Medicare HMO | Attending: Emergency Medicine | Admitting: Emergency Medicine

## 2021-01-06 ENCOUNTER — Other Ambulatory Visit: Payer: Self-pay

## 2021-01-06 ENCOUNTER — Emergency Department (HOSPITAL_COMMUNITY): Payer: Medicare HMO

## 2021-01-06 ENCOUNTER — Encounter (HOSPITAL_COMMUNITY): Payer: Self-pay

## 2021-01-06 DIAGNOSIS — I5042 Chronic combined systolic (congestive) and diastolic (congestive) heart failure: Secondary | ICD-10-CM | POA: Diagnosis not present

## 2021-01-06 DIAGNOSIS — Z79899 Other long term (current) drug therapy: Secondary | ICD-10-CM | POA: Insufficient documentation

## 2021-01-06 DIAGNOSIS — Z7984 Long term (current) use of oral hypoglycemic drugs: Secondary | ICD-10-CM | POA: Insufficient documentation

## 2021-01-06 DIAGNOSIS — K5900 Constipation, unspecified: Secondary | ICD-10-CM | POA: Insufficient documentation

## 2021-01-06 DIAGNOSIS — Z8546 Personal history of malignant neoplasm of prostate: Secondary | ICD-10-CM | POA: Diagnosis not present

## 2021-01-06 DIAGNOSIS — I2510 Atherosclerotic heart disease of native coronary artery without angina pectoris: Secondary | ICD-10-CM | POA: Insufficient documentation

## 2021-01-06 DIAGNOSIS — Z96651 Presence of right artificial knee joint: Secondary | ICD-10-CM | POA: Diagnosis not present

## 2021-01-06 DIAGNOSIS — I4891 Unspecified atrial fibrillation: Secondary | ICD-10-CM | POA: Diagnosis not present

## 2021-01-06 DIAGNOSIS — E119 Type 2 diabetes mellitus without complications: Secondary | ICD-10-CM | POA: Insufficient documentation

## 2021-01-06 LAB — BASIC METABOLIC PANEL
Anion gap: 10 (ref 5–15)
BUN: 23 mg/dL (ref 8–23)
CO2: 24 mmol/L (ref 22–32)
Calcium: 9 mg/dL (ref 8.9–10.3)
Chloride: 102 mmol/L (ref 98–111)
Creatinine, Ser: 1.09 mg/dL (ref 0.61–1.24)
GFR, Estimated: >60 mL/min (ref >60)
Glucose, Bld: 121 mg/dL — ABNORMAL HIGH (ref 70–99)
Potassium: 3.7 mmol/L (ref 3.5–5.1)
Sodium: 136 mmol/L (ref 135–145)

## 2021-01-06 LAB — CBC WITH DIFFERENTIAL/PLATELET
Abs Immature Granulocytes: 0 10*3/uL (ref 0.00–0.07)
Basophils Absolute: 0 10*3/uL (ref 0.0–0.1)
Basophils Relative: 0 %
Eosinophils Absolute: 0.4 10*3/uL (ref 0.0–0.5)
Eosinophils Relative: 2 %
HCT: 36.9 % — ABNORMAL LOW (ref 39.0–52.0)
Hemoglobin: 11.5 g/dL — ABNORMAL LOW (ref 13.0–17.0)
Lymphocytes Relative: 9 %
Lymphs Abs: 2 10*3/uL (ref 0.7–4.0)
MCH: 25.6 pg — ABNORMAL LOW (ref 26.0–34.0)
MCHC: 31.2 g/dL (ref 30.0–36.0)
MCV: 82.2 fL (ref 80.0–100.0)
Monocytes Absolute: 0 10*3/uL — ABNORMAL LOW (ref 0.1–1.0)
Monocytes Relative: 0 %
Neutro Abs: 19.4 10*3/uL — ABNORMAL HIGH (ref 1.7–7.7)
Neutrophils Relative %: 89 %
Platelets: 156 10*3/uL (ref 150–400)
RBC: 4.49 MIL/uL (ref 4.22–5.81)
RDW: 26.5 % — ABNORMAL HIGH (ref 11.5–15.5)
WBC: 21.8 10*3/uL — ABNORMAL HIGH (ref 4.0–10.5)
nRBC: 0 % (ref 0.0–0.2)
nRBC: 0 /100 WBC

## 2021-01-06 MED ORDER — DOCUSATE SODIUM 100 MG PO CAPS: 100.0000 mg | ORAL_CAPSULE | Freq: Two times a day (BID) | ORAL | 0 refills | Status: DC | PRN

## 2021-01-06 MED ORDER — POLYETHYLENE GLYCOL 3350 17 G PO PACK
17.0000 g | PACK | Freq: Every day | ORAL | 0 refills | Status: DC | PRN
Start: 2021-01-06 — End: 2021-09-13

## 2021-01-06 NOTE — ED Triage Notes (Signed)
Pt reports constipation, has not had a good Bm since Monday, had a tiny Bm last night that he describes as 2 hard balls. Some abd pain. Tried a laxative OTC without relief. Pt also trying apple juice and cranberry juice. Denies n/v. Started chemo on Monday for a GI cancer and thinks it is from that. Pt a.o, ambulatory

## 2021-01-06 NOTE — ED Provider Notes (Signed)
[name] EMERGENCY DEPARTMENT Provider Note   CSN: [id number] Arrival date & time: 01/06/21  3419     History Chief Complaint  Patient presents with   Constipation    Jon Williams is a 70 y.o. male.   Constipation Associated symptoms: abdominal pain   Associated symptoms: no back pain, no nausea and no vomiting   Patient presents with constipation.  Has not had a good bowel movement since Monday with today being Friday.  Is had 2 small bowel movements.  Mild lower abdominal pain.  States he feels as if he has to go but cannot.  No fevers.  On Monday started chemotherapy for a gastric cancer.  Normally has 2-3 bowel movements a day.  Is not on pain medicines.  Abdomen is not swollen.  No vomiting.    Past Medical History:  Diagnosis Date   AICD (automatic cardioverter/defibrillator) present 05/25/2016   biv icd   Bunion, right    Chronic combined systolic and diastolic CHF (congestive heart failure) ([place])    Echo 1/18: Mild conc LVH, EF 15-20, severe diff HK, inf and inf-septal AK, Gr 3 DD, mild to mod MR, severe LAE, mod reduced RVSF, mod RAE, mild TR, PASP 50   Coronary artery disease involving native coronary artery without angina pectoris 04/17/2016   LHC 1/18: pLCx 52, mLCx 20, mRCA 40, dRCA 20, LVEDP 23, mean RA 8, PA 42/20, PCWP 17   Diabetes mellitus without complication (HCC)    DJD (degenerative joint disease)    History of atrial fibrillation    History of atrial flutter    History of cardiomegaly 06/07/2016   Noted on CXR   History of colon polyps 06/28/2017   Noted on colonoscopy   LBBB (left bundle branch block)    NICM (nonischemic cardiomyopathy) ([place])    Echo 1/18:  Mild conc LVH, EF 15-20, severe diff HK, inf and inf-septal AK, Gr 3 DD, mild to mod MR, severe LAE, mod reduced RVSF, mod RAE, mild TR, PASP 50   OA (osteoarthritis)    knee   Other secondary pulmonary hypertension ([place]) 04/17/2016   Prostate cancer ([place]) 2019   Sigmoid  diverticulosis 06/28/2017   Noted on colonoscopy    Patient Active Problem List   Diagnosis Date Noted   Iron deficiency anemia due to chronic blood loss 12/12/2020   Gastric cancer ([place]) 12/08/2020   Symptomatic anemia 11/30/2020   Gastrointestinal hemorrhage    Corns and callosities 08/07/2019   Blood clotting disorder ([place]) 05/06/2019   Coagulation defect ([place]) 05/06/2019   Pacemaker lead malfunction 10/10/2018   Pacemaker complications 62/22/9798   Pacemaker lead failure, sequela 10/10/2018   S/P knee replacement 08/16/2017   S/P total knee arthroplasty 08/16/2017   Malignant neoplasm of prostate ([place]) 04/02/2017   ICD (implantable cardioverter-defibrillator) in place    Persistent atrial fibrillation ([place]) 05/25/2016   Atrial fibrillation ([place]) 05/25/2016   Coronary artery disease involving native coronary artery without angina pectoris 04/17/2016   Other secondary pulmonary hypertension ([place]) 04/17/2016   Chronic combined systolic and diastolic CHF (congestive heart failure) ([place])    NICM (nonischemic cardiomyopathy) ([place])    LBBB (left bundle branch block)     Past Surgical History:  Procedure Laterality Date   BIOPSY  12/01/2020   Procedure: BIOPSY;  Surgeon: [name], MD;  Location: [place];  Service: Endoscopy;;   BIV ICD INSERTION CRT-D N/A 05/25/2016   Procedure: BiV ICD Insertion CRT-D;  Surgeon: [name],  MD;  Location: [place] [facility];  Service: Cardiovascular;  Laterality: N/A;   CARDIAC CATHETERIZATION N/A 03/02/2016   Procedure: Right/Left Heart Cath and Coronary Angiography;  Surgeon: Nelva Bush, MD;  Location: [place] [facility];  Service: Cardiovascular;  Laterality: N/A;   CARDIOVERSION N/A 07/17/2016   Procedure: Cardioversion;  Surgeon: [name], MD;  Location: [place] [facility];  Service: Cardiovascular;  Laterality: N/A;   COLONOSCOPY WITH PROPOFOL N/A 06/28/2017   Procedure: COLONOSCOPY WITH PROPOFOL;  Surgeon: [name], MD;  Location: [facility];  Service: Endoscopy;  Laterality: N/A;   colonscopy  2009   ESOPHAGOGASTRODUODENOSCOPY N/A 12/01/2020   Procedure: ESOPHAGOGASTRODUODENOSCOPY (EGD);  Surgeon: [name], MD;  Location: [place];  Service: Endoscopy;  Laterality: N/A;  IDA/guaiac positive stools   ESOPHAGOGASTRODUODENOSCOPY (EGD) WITH PROPOFOL N/A 12/16/2020   Procedure: ESOPHAGOGASTRODUODENOSCOPY (EGD) WITH PROPOFOL;  Surgeon: [name], MD;  Location: [facility];  Service: Endoscopy;  Laterality: N/A;   GOLD SEED IMPLANT N/A 12/24/2017   Procedure: GOLD SEED IMPLANT, TRANSERINEAL;  Surgeon: Festus Aloe, MD;  Location: [facility];  Service: Urology;  Laterality: N/A;   INSERT / REPLACE / REMOVE PACEMAKER     LEAD REVISION  10/10/2018   LEAD REVISION/REPAIR N/A 10/10/2018   Procedure: LEAD REVISION/REPAIR;  Surgeon: [name], MD;  Location: [place] [facility];  Service: Cardiovascular;  Laterality: N/A;   POLYPECTOMY  06/28/2017   Procedure: POLYPECTOMY;  Surgeon: [name], MD;  Location: [facility];  Service: Endoscopy;;  ascending and descending colon polyp   PORTACATH PLACEMENT Right 12/23/2020   Procedure: INSERTION PORT-A-CATH;  Surgeon: [name], MD;  Location: [place];  Service: General;  Laterality: Right;   PROSTATE BIOPSY  02/20/2017   SPACE OAR INSTILLATION N/A 12/24/2017   Procedure: SPACE OAR INSTILLATION;  Surgeon: Festus Aloe, MD;  Location: [facility];  Service: Urology;  Laterality: N/A;   TOTAL KNEE ARTHROPLASTY Right 08/16/2017   Procedure: RIGHT TOTAL KNEE ARTHROPLASTY;  Surgeon: [name], MD;  Location: [facility];  Service: Orthopedics;  Laterality: Right;   UPPER ESOPHAGEAL ENDOSCOPIC ULTRASOUND (EUS) N/A 12/16/2020   Procedure: UPPER ESOPHAGEAL ENDOSCOPIC ULTRASOUND (EUS);  Surgeon: [name], MD;  Location: [facility];  Service: Endoscopy;  Laterality: N/A;       Family History  Problem Relation Age of Onset    Hypertension Mother    Heart disease Mother    Diabetes Mother    Diabetes Father    Hypertension Father    Cancer Father        lung cancer   Healthy Sister    Heart attack Brother    Heart disease Brother 49       + tobacco   Healthy Brother     Social History   Tobacco Use   Smoking status: Never   Smokeless tobacco: Never  Vaping Use   Vaping Use: Never used  Substance Use Topics   Alcohol use: No   Drug use: No    Home Medications Prior to Admission medications   Medication Sig Start Date End Date Taking? Authorizing Provider  docusate sodium (COLACE) 100 MG capsule Take 1 capsule (100 mg total) by mouth 2 (two) times daily as needed for mild constipation. 01/06/21  Yes Davonna Belling, MD  polyethylene glycol (MIRALAX / GLYCOLAX) 17 g packet Take 17 g by mouth daily as needed for moderate constipation. 01/06/21  Yes Davonna Belling, MD  atorvastatin (LIPITOR) 40 MG tablet TAKE 1 TABLET(40 MG)  BY MOUTH DAILY 11/04/20   [name], MD  carvedilol (COREG) 3.125 MG tablet TAKE 1 TABLET(3.125 MG) BY MOUTH TWICE DAILY 11/04/20   [name], MD  cholecalciferol (VITAMIN D3) 25 MCG (1000 UT) tablet Take 1,000 Units by mouth daily with lunch.     [provider]  dexamethasone (DECADRON) 4 MG tablet Take 2 tablets (8 mg total) by mouth daily. Take 1 tab daily starting the day after chemotherapy for 2 days. Take with food. 12/20/20   Truitt Merle, MD  furosemide (LASIX) 40 MG tablet Take 1 tablet (40 mg total) by mouth 2 (two) times daily. 03/07/20   [name], [name], MD  lidocaine-prilocaine (EMLA) cream Apply to affected area once 12/20/20   Truitt Merle, MD  metFORMIN (GLUCOPHAGE-XR) 500 MG 24 hr tablet Take 500 mg by mouth every evening. 06/13/20   [provider]  Multiple Vitamin (MULTIVITAMIN WITH MINERALS) TABS tablet Take 1 tablet by mouth daily with lunch. One-A-Day    [provider]  Naphazoline HCl (CLEAR EYES OP) Place 1 drop into both eyes  daily as needed (irritation).    [provider]  ondansetron (ZOFRAN) 4 MG tablet Take 1 tablet (4 mg total) by mouth every 6 (six) hours as needed for nausea. 12/02/20   Aline August, MD  ondansetron (ZOFRAN) 8 MG tablet Take 1 tablet (8 mg total) by mouth 2 (two) times daily as needed for refractory nausea / vomiting. Start on day 3 after chemotherapy. 12/20/20   Truitt Merle, MD  pantoprazole (PROTONIX) 40 MG tablet Take 1 tablet (40 mg total) by mouth 2 (two) times daily. 12/02/20   Aline August, MD  potassium chloride (KLOR-CON) 10 MEQ tablet TAKE 1 TABLET(10 MEQ) BY MOUTH TWICE DAILY 12/05/20   [name], MD  prochlorperazine (COMPAZINE) 10 MG tablet Take 1 tablet (10 mg total) by mouth every 6 (six) hours as needed (Nausea or vomiting). 12/20/20   Truitt Merle, MD  sacubitril-valsartan (ENTRESTO) 24-26 MG Take 1 tablet by mouth 2 (two) times daily. 12/21/20   [name], MD  sotalol (BETAPACE) 80 MG tablet Take 120 mg by mouth in the morning and at bedtime.    [provider]    Allergies    Aspirin, Sulfa antibiotics, and Other  Review of Systems   Review of Systems  Constitutional:  Negative for appetite change.  HENT:  Negative for congestion.   Respiratory:  Negative for shortness of breath.   Gastrointestinal:  Positive for abdominal pain and constipation. Negative for nausea and vomiting.  Genitourinary:  Negative for flank pain.  Musculoskeletal:  Negative for back pain.  Skin:  Negative for rash.  Neurological:  Negative for weakness.  Psychiatric/Behavioral:  Negative for confusion.    Physical Exam Updated Vital Signs BP 121/78   Pulse 61   Temp 98 F (36.7 C) (Oral)   Resp 19   Ht 5\' 6"  (1.676 m)   Wt 92.5 kg   SpO2 100%   BMI 32.93 kg/m   Physical Exam Vitals and nursing note reviewed.  Eyes:     Pupils: Pupils are equal, round, and reactive to light.  Cardiovascular:     Rate and Rhythm: Regular rhythm.  Abdominal:      Tenderness: There is abdominal tenderness.     Comments: Mild suprapubic tenderness without rebound or guarding.  No hernia palpated.  Musculoskeletal:        General: No tenderness.     Cervical back:  Neck supple.  Skin:    General: Skin is warm.  Neurological:     Mental Status: He is alert and oriented to person, place, and time.    ED Results / Procedures / Treatments   Labs (all labs ordered are listed, but only abnormal results are displayed) Labs Reviewed  CBC WITH DIFFERENTIAL/PLATELET - Abnormal; Notable for the following components:      Result Value   WBC 21.8 (*)    Hemoglobin 11.5 (*)    HCT 36.9 (*)    MCH 25.6 (*)    RDW 26.5 (*)    Neutro Abs 19.4 (*)    Monocytes Absolute 0.0 (*)    All other components within normal limits  BASIC METABOLIC PANEL - Abnormal; Notable for the following components:   Glucose, Bld 121 (*)    All other components within normal limits    EKG None  Radiology DG Abd 2 Views  Result Date: 01/06/2021 CLINICAL DATA:  Constipation EXAM: ABDOMEN - 2 VIEW COMPARISON:  CT chest/abdomen/pelvis 12/01/2020 FINDINGS: There is a nonobstructive bowel gas pattern. There is a moderate stool burden throughout the colon. There is no free intraperitoneal air. There is no abnormal soft tissue calcification. There is no gross organomegaly. The imaged lung bases are clear. A cardiac device lead is partially imaged. Surgical clips are noted projecting over the pubic symphysis. There is no acute osseous abnormality. IMPRESSION: Moderate stool burden throughout the colon without evidence of mechanical obstruction. Electronically Signed   By: Valetta Mole M.D.   On: 01/06/2021 08:19    Procedures Procedures   Medications Ordered in ED Medications - No data to display  ED Course  I have reviewed the triage vital signs and the nursing notes.  Pertinent labs & imaging results that were available during my care of the patient were reviewed by me and  considered in my medical decision making (see chart for details).    MDM Rules/Calculators/A&P                           Patient presents with constipation.  No bowel movement for the last few days.  Mild lower abdominal tenderness.  No rebound or guarding.  White count is elevated but I think this is likely secondary to the stimulating treatment he got because of the chemotherapy.  No fever.  X-ray showed a moderate amount of stool.  Rectal exam showed no impaction but a fair amount of stool.  Feels better after the rectal exam but not have a bowel movement.  Discussed with patient and will give laxatives/stool softeners.  Reviewing records it appears as if his malignancy was in the upper abdomen.  Lower abdomen pathology considered but felt less likely.  Diverticulitis or other colitis also thought as a possibility but will at this point just to symptomatic treatment if patient develops fevers symptoms not improve or pain worsens will come to the ER for further imaging. Final Clinical Impression(s) / ED Diagnoses Final diagnoses:  Constipation  Constipation, unspecified constipation type    Rx / DC Orders ED Discharge Orders          Ordered    polyethylene glycol (MIRALAX / GLYCOLAX) 17 g packet  Daily PRN        01/06/21 1041    docusate sodium (COLACE) 100 MG capsule  2 times daily PRN        01/06/21 1041  Davonna Belling, MD 01/06/21 1051

## 2021-01-06 NOTE — Discharge Instructions (Addendum)
Watch for increasing abdominal pain.  Watch for fevers.  Watch for blood.  Your white count is elevated but is likely from the stimulation medicine you were given.  If the pain worsens return to the ER.

## 2021-01-09 ENCOUNTER — Ambulatory Visit (INDEPENDENT_AMBULATORY_CARE_PROVIDER_SITE_OTHER): Payer: Medicare HMO

## 2021-01-09 ENCOUNTER — Encounter: Payer: Self-pay | Admitting: Hematology

## 2021-01-09 ENCOUNTER — Inpatient Hospital Stay: Payer: Medicare HMO

## 2021-01-09 ENCOUNTER — Inpatient Hospital Stay: Payer: Medicare HMO | Admitting: Hematology

## 2021-01-09 ENCOUNTER — Other Ambulatory Visit: Payer: Self-pay

## 2021-01-09 VITALS — BP 120/74 | HR 64 | Temp 98.1°F | Resp 18 | Ht 66.0 in | Wt 207.0 lb

## 2021-01-09 DIAGNOSIS — Z5189 Encounter for other specified aftercare: Secondary | ICD-10-CM | POA: Diagnosis not present

## 2021-01-09 DIAGNOSIS — I428 Other cardiomyopathies: Secondary | ICD-10-CM

## 2021-01-09 DIAGNOSIS — Z95828 Presence of other vascular implants and grafts: Secondary | ICD-10-CM

## 2021-01-09 DIAGNOSIS — C163 Malignant neoplasm of pyloric antrum: Secondary | ICD-10-CM

## 2021-01-09 DIAGNOSIS — R197 Diarrhea, unspecified: Secondary | ICD-10-CM | POA: Diagnosis not present

## 2021-01-09 DIAGNOSIS — Z5111 Encounter for antineoplastic chemotherapy: Secondary | ICD-10-CM | POA: Diagnosis not present

## 2021-01-09 DIAGNOSIS — C169 Malignant neoplasm of stomach, unspecified: Secondary | ICD-10-CM | POA: Diagnosis not present

## 2021-01-09 DIAGNOSIS — D508 Other iron deficiency anemias: Secondary | ICD-10-CM | POA: Diagnosis not present

## 2021-01-09 LAB — COMPREHENSIVE METABOLIC PANEL
ALT: 16 U/L (ref 0–44)
AST: 18 U/L (ref 15–41)
Albumin: 3.9 g/dL (ref 3.5–5.0)
Alkaline Phosphatase: 74 U/L (ref 38–126)
Anion gap: 9 (ref 5–15)
BUN: 18 mg/dL (ref 8–23)
CO2: 24 mmol/L (ref 22–32)
Calcium: 8.6 mg/dL — ABNORMAL LOW (ref 8.9–10.3)
Chloride: 106 mmol/L (ref 98–111)
Creatinine, Ser: 1.15 mg/dL (ref 0.61–1.24)
GFR, Estimated: >60 mL/min (ref >60)
Glucose, Bld: 107 mg/dL — ABNORMAL HIGH (ref 70–99)
Potassium: 3.9 mmol/L (ref 3.5–5.1)
Sodium: 139 mmol/L (ref 135–145)
Total Bilirubin: 0.6 mg/dL (ref 0.3–1.2)
Total Protein: 6.9 g/dL (ref 6.5–8.1)

## 2021-01-09 LAB — CUP PACEART REMOTE DEVICE CHECK
Battery Remaining Longevity: 32 mo
Battery Remaining Percentage: 36 %
Battery Voltage: 2.92 V
Brady Statistic AP VP Percent: 0 %
Brady Statistic AP VS Percent: 0 %
Brady Statistic AS VP Percent: 0 %
Brady Statistic AS VS Percent: 0 %
Brady Statistic RA Percent Paced: 1 %
Brady Statistic RV Percent Paced: 87 %
Date Time Interrogation Session: 20221128020022
HighPow Impedance: 55 Ohm
HighPow Impedance: 55 Ohm
Implantable Lead Implant Date: 20180413
Implantable Lead Implant Date: 20200828
Implantable Lead Location: 753859
Implantable Lead Location: 753860
Implantable Lead Model: 7122
Implantable Pulse Generator Implant Date: 20180413
Lead Channel Impedance Value: 380 Ohm
Lead Channel Impedance Value: 390 Ohm
Lead Channel Pacing Threshold Amplitude: 0.75 V
Lead Channel Pacing Threshold Amplitude: 0.75 V
Lead Channel Pacing Threshold Pulse Width: 0.5 ms
Lead Channel Pacing Threshold Pulse Width: 0.5 ms
Lead Channel Sensing Intrinsic Amplitude: 2.1 mV
Lead Channel Sensing Intrinsic Amplitude: 6.5 mV
Lead Channel Setting Pacing Amplitude: 2 V
Lead Channel Setting Pacing Amplitude: 2.5 V
Lead Channel Setting Pacing Pulse Width: 0.5 ms
Lead Channel Setting Sensing Sensitivity: 0.5 mV
Pulse Gen Serial Number: 7398151

## 2021-01-09 LAB — CBC WITH DIFFERENTIAL/PLATELET
Abs Immature Granulocytes: 0.1 10*3/uL — ABNORMAL HIGH (ref 0.00–0.07)
Band Neutrophils: 7 %
Basophils Absolute: 0 10*3/uL (ref 0.0–0.1)
Basophils Relative: 0 %
Eosinophils Absolute: 0 10*3/uL (ref 0.0–0.5)
Eosinophils Relative: 0 %
HCT: 34.6 % — ABNORMAL LOW (ref 39.0–52.0)
Hemoglobin: 10.9 g/dL — ABNORMAL LOW (ref 13.0–17.0)
Lymphocytes Relative: 22 %
Lymphs Abs: 2.7 10*3/uL (ref 0.7–4.0)
MCH: 25.3 pg — ABNORMAL LOW (ref 26.0–34.0)
MCHC: 31.5 g/dL (ref 30.0–36.0)
MCV: 80.5 fL (ref 80.0–100.0)
Metamyelocytes Relative: 1 %
Monocytes Absolute: 1.2 10*3/uL — ABNORMAL HIGH (ref 0.1–1.0)
Monocytes Relative: 10 %
Neutro Abs: 8.3 10*3/uL — ABNORMAL HIGH (ref 1.7–7.7)
Neutrophils Relative %: 60 %
Platelets: 159 10*3/uL (ref 150–400)
RBC: 4.3 MIL/uL (ref 4.22–5.81)
RDW: 26.4 % — ABNORMAL HIGH (ref 11.5–15.5)
WBC: 12.4 10*3/uL — ABNORMAL HIGH (ref 4.0–10.5)
nRBC: 1.2 % — ABNORMAL HIGH (ref 0.0–0.2)

## 2021-01-09 MED ORDER — SODIUM CHLORIDE 0.9% FLUSH
10.0000 mL | INTRAVENOUS | Status: DC | PRN
  Administered 2021-01-09: 10:00:00 10 mL via INTRAVENOUS

## 2021-01-09 MED ORDER — HEPARIN SOD (PORK) LOCK FLUSH 100 UNIT/ML IV SOLN
500.0000 [IU] | Freq: Once | INTRAVENOUS | Status: AC
  Administered 2021-01-09: 10:00:00 500 [IU] via INTRAVENOUS

## 2021-01-09 NOTE — Progress Notes (Signed)
Jon Williams   [phone] [phone]   Clinic Follow up Note   Patient Care Team: [name], MD as PCP - General (Internal Medicine) [name], [name], MD as PCP - Cardiology (Cardiology) [name], MD as PCP - Electrophysiology (Cardiology) [name], MD as Consulting Physician (General Surgery) Truitt Merle, MD as Consulting Physician (Hematology)  Date of Service:  01/09/2021  CHIEF COMPLAINT: f/u of gastric cancer  CURRENT THERAPY:  Neoadjuvant FLOT4, q2weeks, started 01/02/21  ASSESSMENT & PLAN:  Jon Williams is a 70 y.o. male with   1. Gastric Cancer, cT3N0M0, stage II -initially referred to ED by PCP on 11/30/20 for low hemoglobin of 7.4 on routine lab work. He was also recently treated for H-pylori gastritis. EGD on 12/01/20 by Dr. Benson Norway showed a tumor at the incisura, which pathology confirmed was adenocarcinoma.  -CT CAP 12/01/20 showed no metastatic disease. -He met with Dr. Zenia Resides on 12/05/20, who recommends EUS to determine staging.  -He underwent echo on 10/26 showing reduced EF and severe LV dysfunction. Per Dr. Lovena Le, surgery is recommended only if it is curative given his moderate risk. -He proceeded to EUS on 12/16/20 under Dr. Benson Norway, staging T3 N0. -IHC returned abnormal, showing loss of expression in MLH1 and PMS2. Which predict good response to immunotherapy  -port placed 12/23/20 -he started FLOT4 with dose reduction on 01/02/21. He tolerated the first cycle very well except constipation  2. Symptom Management: Constipation, Back pain -secondary to treatment -we discussed using miralax to manage constipation and how to avoid diarrhea.  -he reports one night of right-sided back pain on 11/26. Given that he received his injection on 11/22, this is likely not related. Pain resolved on its own.   3. H/o Prostate Cancer  -diagnosed with Gleason 4+4 prostate cancer in early 2019. He was treated with long-term ADT  in combination with 8 weeks of IMRT.   4. Diabetes, atrial fibrillation, severe cardiomyopathy with EF 20 to 25%, status post ICD placement -Follow-up with PCP and cardiology -We will carefully monitor his heart function during chemotherapy   5. Iron deficient anemia -Secondary to gastric cancer -received 2 doses of IV Venofer, last 12/20/20 -hg stable at 10.9 today (01/09/21), s/p Venofer     PLAN:  -return in 1 week for lab, flush, f/u, and C2 FLOT4  -G-CSF on day 2   No problem-specific Assessment & Plan notes found for this encounter.   SUMMARY OF ONCOLOGIC HISTORY: Oncology History Overview Note  Cancer Staging Gastric cancer [facility]) Staging form: Stomach, AJCC 8th Edition - Clinical stage from 12/01/2020: Stage IIB (cT3, cN0, cM0) - Signed by Truitt Merle, MD on 12/20/2020 Stage prefix: Initial diagnosis Total positive nodes: 0  Malignant neoplasm of prostate ([place]) Staging form: Prostate, AJCC 8th Edition - Clinical: Stage IIC (cT2b, cN0, cM0, PSA: 6.7, Grade Group: 4) - Unsigned Prostate specific antigen (PSA) range: Less than 10 Gleason score: 8 Histologic grading system: 5 grade system    Gastric cancer ([place])  12/01/2020 Procedure   Upper Endoscopy, Dr. Benson Norway  Impression: - Normal esophagus. 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 Biopsied. - Normal examined duodenum.   12/01/2020 Pathology Results   FINAL MICROSCOPIC DIAGNOSIS:   A. INCISURA, BIOPSY:  - Adenocarcinoma, moderate to poorly differentiated arising in a  background of chronic gastritis with intestinal metaplasia.    12/01/2020 Imaging   CT CAP  IMPRESSION: Focal wall thickening along the posterior aspect of the gastric antrum, likely  corresponding to the patient's newly diagnosed gastric cancer.   No findings suspicious for metastatic disease.   Mild multifocal pneumonia, lower lobe predominant, likely on the basis of aspiration. Trace right pleural effusion.   Fiducial markers along  the prostate in this patient with known prostate cancer.   12/01/2020 Cancer Staging   Staging form: Stomach, AJCC 8th Edition - Clinical stage from 12/01/2020: Stage IIB (cT3, cN0, cM0) - Signed by Truitt Merle, MD on 12/20/2020 Stage prefix: Initial diagnosis Total positive nodes: 0    12/08/2020 Initial Diagnosis   Gastric cancer ([place])   12/16/2020 Procedure   EUS  - Wall thickening was seen in the lesser curve of the stomach and in the antrum of the stomach. The thickening appeared to be primarily within the serosa (Layer 5). T3 N0 Mx. - There was no sign of significant pathology in the entire pancreas. - There was no sign of significant pathology in the common bile duct and in the gallbladder. - There was no evidence of significant pathology in the left lobe of the liver. - Endosonographic images of the left adrenal gland were unremarkable. - The celiac trunk and superior mesenteric artery were endosonographically normal. - The mediastinum was unremarkable endosonographically. - No specimens collected.   01/02/2021 -  Chemotherapy   Patient is on Treatment Plan : GASTROESOPHAGEAL FLOT q14d X 4 cycles        INTERVAL HISTORY:  Jon Williams is here for a follow up of gastric cancer. He was last seen by me on 12/20/20. He presents to the clinic accompanied by his wife. He reports pain to the right side of his back "off and on" on Saturday (11/26), mostly while lying down. This has resolved. He also had constipation and went to the ED. They report he did not try any OTC medication prior to going to the ED. "I didn't know what to take." He notes they called the [facility] on Thanksgiving Day and did not hear back until the following day, after they had already gone to the ED. He was told to take miralax. He reports he is now having diarrhea.   All other systems were reviewed with the patient and are negative.  MEDICAL HISTORY:  Past Medical History:  Diagnosis Date   AICD  (automatic cardioverter/defibrillator) present 05/25/2016   biv icd   Bunion, right    Chronic combined systolic and diastolic CHF (congestive heart failure) ([place])    Echo 1/18: Mild conc LVH, EF 15-20, severe diff HK, inf and inf-septal AK, Gr 3 DD, mild to mod MR, severe LAE, mod reduced RVSF, mod RAE, mild TR, PASP 50   Coronary artery disease involving native coronary artery without angina pectoris 04/17/2016   LHC 1/18: pLCx 32, mLCx 20, mRCA 40, dRCA 20, LVEDP 23, mean RA 8, PA 42/20, PCWP 17   Diabetes mellitus without complication (HCC)    DJD (degenerative joint disease)    History of atrial fibrillation    History of atrial flutter    History of cardiomegaly 06/07/2016   Noted on CXR   History of colon polyps 06/28/2017   Noted on colonoscopy   LBBB (left bundle branch block)    NICM (nonischemic cardiomyopathy) ([place])    Echo 1/18:  Mild conc LVH, EF 15-20, severe diff HK, inf and inf-septal AK, Gr 3 DD, mild to mod MR, severe LAE, mod reduced RVSF, mod RAE, mild TR, PASP 50   OA (osteoarthritis)    knee  Other secondary pulmonary hypertension ([place]) 04/17/2016   Prostate cancer ([place]) 2019   Sigmoid diverticulosis 06/28/2017   Noted on colonoscopy    SURGICAL HISTORY: Past Surgical History:  Procedure Laterality Date   BIOPSY  12/01/2020   Procedure: BIOPSY;  Surgeon: [name], MD;  Location: [place];  Service: Endoscopy;;   BIV ICD INSERTION CRT-D N/A 05/25/2016   Procedure: BiV ICD Insertion CRT-D;  Surgeon: [name], MD;  Location: [place] CV [facility];  Service: Cardiovascular;  Laterality: N/A;   CARDIAC CATHETERIZATION N/A 03/02/2016   Procedure: Right/Left Heart Cath and Coronary Angiography;  Surgeon: Nelva Bush, MD;  Location: [place] [facility] LAB;  Service: Cardiovascular;  Laterality: N/A;   CARDIOVERSION N/A 07/17/2016   Procedure: Cardioversion;  Surgeon: [name], MD;  Location: [place] [facility];  Service: Cardiovascular;  Laterality: N/A;    COLONOSCOPY WITH PROPOFOL N/A 06/28/2017   Procedure: COLONOSCOPY WITH PROPOFOL;  Surgeon: [name], MD;  Location: [facility];  Service: Endoscopy;  Laterality: N/A;   colonscopy  2009   ESOPHAGOGASTRODUODENOSCOPY N/A 12/01/2020   Procedure: ESOPHAGOGASTRODUODENOSCOPY (EGD);  Surgeon: [name], MD;  Location: [facility];  Service: Endoscopy;  Laterality: N/A;  IDA/guaiac positive stools   ESOPHAGOGASTRODUODENOSCOPY (EGD) WITH PROPOFOL N/A 12/16/2020   Procedure: ESOPHAGOGASTRODUODENOSCOPY (EGD) WITH PROPOFOL;  Surgeon: [name], MD;  [facility];  Service: Endoscopy;  Laterality: N/A;   GOLD SEED IMPLANT N/A 12/24/2017   Procedure: GOLD SEED IMPLANT, TRANSERINEAL;  Surgeon: Festus Aloe, MD;  Location: [facility];  Service: Urology;  Laterality: N/A;   INSERT / REPLACE / REMOVE PACEMAKER     LEAD REVISION  10/10/2018   LEAD REVISION/REPAIR N/A 10/10/2018   Procedure: LEAD REVISION/REPAIR;  Surgeon: [name], MD;  Location: [place] [facility];  Service: Cardiovascular;  Laterality: N/A;   POLYPECTOMY  06/28/2017   Procedure: POLYPECTOMY;  Surgeon: [name], MD;  [facility];  Service: Endoscopy;;  ascending and descending colon polyp   PORTACATH PLACEMENT Right 12/23/2020   Procedure: INSERTION PORT-A-CATH;  Surgeon: [name], MD;  Location: [place];  Service: General;  Laterality: Right;   PROSTATE BIOPSY  02/20/2017   SPACE OAR INSTILLATION N/A 12/24/2017   Procedure: SPACE OAR INSTILLATION;  Surgeon: Festus Aloe, MD;  [facility];  Service: Urology;  Laterality: N/A;   TOTAL KNEE ARTHROPLASTY Right 08/16/2017   Procedure: RIGHT TOTAL KNEE ARTHROPLASTY;  Surgeon: [name], MD;  Location: [facility];  Service: Orthopedics;  Laterality: Right;   UPPER ESOPHAGEAL ENDOSCOPIC ULTRASOUND (EUS) N/A 12/16/2020   Procedure: UPPER ESOPHAGEAL ENDOSCOPIC ULTRASOUND (EUS);  Surgeon: [name], MD;  Location: [facility];  Service:  Endoscopy;  Laterality: N/A;    I have reviewed the social history and family history with the patient and they are unchanged from previous note.  ALLERGIES:  is allergic to aspirin, sulfa antibiotics, and other.  MEDICATIONS:  Current Outpatient Medications  Medication Sig Dispense Refill   atorvastatin (LIPITOR) 40 MG tablet TAKE 1 TABLET(40 MG) BY MOUTH DAILY 90 tablet 0   carvedilol (COREG) 3.125 MG tablet TAKE 1 TABLET(3.125 MG) BY MOUTH TWICE DAILY 180 tablet 0   cholecalciferol (VITAMIN D3) 25 MCG (1000 UT) tablet Take 1,000 Units by mouth daily with lunch.      dexamethasone (DECADRON) 4 MG tablet Take 2 tablets (8 mg total) by mouth daily. Take 1 tab daily starting the day after chemotherapy for 2 days. Take with food. 30 tablet 1  docusate sodium (COLACE) 100 MG capsule Take 1 capsule (100 mg total) by mouth 2 (two) times daily as needed for mild constipation. 20 capsule 0   furosemide (LASIX) 40 MG tablet Take 1 tablet (40 mg total) by mouth 2 (two) times daily. 180 tablet 3   lidocaine-prilocaine (EMLA) cream Apply to affected area once 30 g 3   metFORMIN (GLUCOPHAGE-XR) 500 MG 24 hr tablet Take 500 mg by mouth every evening.     Multiple Vitamin (MULTIVITAMIN WITH MINERALS) TABS tablet Take 1 tablet by mouth daily with lunch. One-A-Day     Naphazoline HCl (CLEAR EYES OP) Place 1 drop into both eyes daily as needed (irritation).     ondansetron (ZOFRAN) 4 MG tablet Take 1 tablet (4 mg total) by mouth every 6 (six) hours as needed for nausea. 20 tablet 0   ondansetron (ZOFRAN) 8 MG tablet Take 1 tablet (8 mg total) by mouth 2 (two) times daily as needed for refractory nausea / vomiting. Start on day 3 after chemotherapy. 30 tablet 1   pantoprazole (PROTONIX) 40 MG tablet Take 1 tablet (40 mg total) by mouth 2 (two) times daily. 60 tablet 0   polyethylene glycol (MIRALAX / GLYCOLAX) 17 g packet Take 17 g by mouth daily as needed for moderate constipation. 14 each 0   potassium  chloride (KLOR-CON) 10 MEQ tablet TAKE 1 TABLET(10 MEQ) BY MOUTH TWICE DAILY 180 tablet 3   prochlorperazine (COMPAZINE) 10 MG tablet Take 1 tablet (10 mg total) by mouth every 6 (six) hours as needed (Nausea or vomiting). 30 tablet 1   sacubitril-valsartan (ENTRESTO) 24-26 MG Take 1 tablet by mouth 2 (two) times daily. 180 tablet 3   sotalol (BETAPACE) 80 MG tablet Take 120 mg by mouth in the morning and at bedtime.     No current facility-administered medications for this visit.    PHYSICAL EXAMINATION: ECOG PERFORMANCE STATUS: 1 - Symptomatic but completely ambulatory  Vitals:   01/09/21 1016  BP: 120/74  Pulse: 64  Resp: 18  Temp: 98.1 F (36.7 C)  SpO2: 100%   Wt Readings from Last 3 Encounters:  01/09/21 207 lb (93.9 kg)  01/06/21 204 lb (92.5 kg)  01/02/21 208 lb (94.3 kg)     GENERAL:alert, no distress and comfortable SKIN: skin color normal, no rashes or significant lesions EYES: normal, Conjunctiva are pink and non-injected, sclera clear  NEURO: alert & oriented x 3 with fluent speech  LABORATORY DATA:  I have reviewed the data as listed CBC Latest Ref Rng & Units 01/09/2021 01/06/2021 01/02/2021  WBC 4.0 - 10.5 K/uL 12.4(H) 21.8(H) 6.4  Hemoglobin 13.0 - 17.0 g/dL 10.9(L) 11.5(L) 10.9(L)  Hematocrit 39.0 - 52.0 % 34.6(L) 36.9(L) 34.6(L)  Platelets 150 - 400 K/uL 159 156 204     CMP Latest Ref Rng & Units 01/09/2021 01/06/2021 01/02/2021  Glucose 70 - 99 mg/dL 107(H) 121(H) 99  BUN 8 - 23 mg/dL 18 23 24(H)  Creatinine 0.61 - 1.24 mg/dL 1.15 1.09 1.19  Sodium 135 - 145 mmol/L 139 136 138  Potassium 3.5 - 5.1 mmol/L 3.9 3.7 3.9  Chloride 98 - 111 mmol/L 106 102 105  CO2 22 - 32 mmol/L 24 24 25   Calcium 8.9 - 10.3 mg/dL 8.6(L) 9.0 9.2  Total Protein 6.5 - 8.1 g/dL 6.9 - 7.3  Total Bilirubin 0.3 - 1.2 mg/dL 0.6 - 0.7  Alkaline Phos 38 - 126 U/L 74 - 48  AST 15 - 41 U/L 18 - 23  ALT 0 - 44 U/L 16 - 14      RADIOGRAPHIC STUDIES: I have personally  reviewed the radiological images as listed and agreed with the findings in the report. CUP PACEART REMOTE DEVICE CHECK  Result Date: 01/09/2021 Scheduled remote reviewed. Normal device function.  Known AF, burden >99%, +OAC, CVR 1 magnet response Next remote 91 days. LR     No orders of the defined types were placed in this encounter.  All questions were answered. The patient knows to call the clinic with any problems, questions or concerns. No barriers to learning was detected. The total time spent in the appointment was 20 minutes.     Truitt Merle, MD 01/09/2021   I, Wilburn Mylar, am acting as scribe for Truitt Merle, MD.   I have reviewed the above documentation for accuracy and completeness, and I agree with the above.

## 2021-01-10 DIAGNOSIS — I428 Other cardiomyopathies: Secondary | ICD-10-CM | POA: Diagnosis not present

## 2021-01-10 DIAGNOSIS — C169 Malignant neoplasm of stomach, unspecified: Secondary | ICD-10-CM | POA: Diagnosis not present

## 2021-01-10 DIAGNOSIS — D500 Iron deficiency anemia secondary to blood loss (chronic): Secondary | ICD-10-CM | POA: Diagnosis not present

## 2021-01-12 ENCOUNTER — Other Ambulatory Visit: Payer: Medicare HMO

## 2021-01-12 ENCOUNTER — Ambulatory Visit: Payer: Medicare HMO

## 2021-01-12 ENCOUNTER — Ambulatory Visit: Payer: Medicare HMO | Admitting: Hematology

## 2021-01-13 ENCOUNTER — Ambulatory Visit: Payer: Medicare HMO

## 2021-01-17 ENCOUNTER — Inpatient Hospital Stay: Payer: Medicare HMO

## 2021-01-17 ENCOUNTER — Other Ambulatory Visit: Payer: Self-pay

## 2021-01-17 ENCOUNTER — Inpatient Hospital Stay: Payer: Medicare HMO | Attending: Hematology

## 2021-01-17 ENCOUNTER — Inpatient Hospital Stay: Payer: Medicare HMO | Admitting: Hematology

## 2021-01-17 ENCOUNTER — Encounter: Payer: Self-pay | Admitting: Hematology

## 2021-01-17 VITALS — BP 114/78 | HR 89 | Temp 98.4°F | Resp 17 | Wt 206.4 lb

## 2021-01-17 DIAGNOSIS — C160 Malignant neoplasm of cardia: Secondary | ICD-10-CM | POA: Diagnosis not present

## 2021-01-17 DIAGNOSIS — C163 Malignant neoplasm of pyloric antrum: Secondary | ICD-10-CM | POA: Diagnosis not present

## 2021-01-17 DIAGNOSIS — D508 Other iron deficiency anemias: Secondary | ICD-10-CM | POA: Insufficient documentation

## 2021-01-17 DIAGNOSIS — Z5111 Encounter for antineoplastic chemotherapy: Secondary | ICD-10-CM | POA: Insufficient documentation

## 2021-01-17 DIAGNOSIS — C61 Malignant neoplasm of prostate: Secondary | ICD-10-CM | POA: Diagnosis not present

## 2021-01-17 DIAGNOSIS — Z5189 Encounter for other specified aftercare: Secondary | ICD-10-CM | POA: Diagnosis not present

## 2021-01-17 DIAGNOSIS — Z95828 Presence of other vascular implants and grafts: Secondary | ICD-10-CM

## 2021-01-17 LAB — COMPREHENSIVE METABOLIC PANEL
ALT: 25 U/L (ref 0–44)
AST: 21 U/L (ref 15–41)
Albumin: 3.9 g/dL (ref 3.5–5.0)
Alkaline Phosphatase: 71 U/L (ref 38–126)
Anion gap: 10 (ref 5–15)
BUN: 18 mg/dL (ref 8–23)
CO2: 22 mmol/L (ref 22–32)
Calcium: 8.7 mg/dL — ABNORMAL LOW (ref 8.9–10.3)
Chloride: 107 mmol/L (ref 98–111)
Creatinine, Ser: 1.21 mg/dL (ref 0.61–1.24)
GFR, Estimated: >60 mL/min (ref >60)
Glucose, Bld: 94 mg/dL (ref 70–99)
Potassium: 3.9 mmol/L (ref 3.5–5.1)
Sodium: 139 mmol/L (ref 135–145)
Total Bilirubin: 0.4 mg/dL (ref 0.3–1.2)
Total Protein: 6.6 g/dL (ref 6.5–8.1)

## 2021-01-17 LAB — CBC WITH DIFFERENTIAL/PLATELET
Abs Immature Granulocytes: 0.09 10*3/uL — ABNORMAL HIGH (ref 0.00–0.07)
Basophils Absolute: 0 10*3/uL (ref 0.0–0.1)
Basophils Relative: 0 %
Eosinophils Absolute: 0 10*3/uL (ref 0.0–0.5)
Eosinophils Relative: 0 %
HCT: 34 % — ABNORMAL LOW (ref 39.0–52.0)
Hemoglobin: 11.1 g/dL — ABNORMAL LOW (ref 13.0–17.0)
Immature Granulocytes: 1 %
Lymphocytes Relative: 11 %
Lymphs Abs: 1 10*3/uL (ref 0.7–4.0)
MCH: 26.1 pg (ref 26.0–34.0)
MCHC: 32.6 g/dL (ref 30.0–36.0)
MCV: 80 fL (ref 80.0–100.0)
Monocytes Absolute: 0.5 10*3/uL (ref 0.1–1.0)
Monocytes Relative: 6 %
Neutro Abs: 7.6 10*3/uL (ref 1.7–7.7)
Neutrophils Relative %: 82 %
Platelets: 163 10*3/uL (ref 150–400)
RBC: 4.25 MIL/uL (ref 4.22–5.81)
RDW: 27.9 % — ABNORMAL HIGH (ref 11.5–15.5)
WBC: 9.2 10*3/uL (ref 4.0–10.5)
nRBC: 0 % (ref 0.0–0.2)

## 2021-01-17 MED ORDER — SODIUM CHLORIDE 0.9 % IV SOLN
10.0000 mg | Freq: Once | INTRAVENOUS | Status: AC
  Administered 2021-01-17: 10 mg via INTRAVENOUS
  Filled 2021-01-17: qty 10

## 2021-01-17 MED ORDER — SODIUM CHLORIDE 0.9% FLUSH
10.0000 mL | INTRAVENOUS | Status: AC | PRN
  Administered 2021-01-17: 10 mL

## 2021-01-17 MED ORDER — SODIUM CHLORIDE 0.9% FLUSH: 10.0000 mL | INTRAVENOUS | Status: DC | PRN

## 2021-01-17 MED ORDER — DEXTROSE 5 % IV SOLN: Freq: Once | INTRAVENOUS | Status: AC

## 2021-01-17 MED ORDER — LEUCOVORIN CALCIUM INJECTION 350 MG
200.0000 mg/m2 | Freq: Once | INTRAVENOUS | Status: AC
  Administered 2021-01-17: 420 mg via INTRAVENOUS
  Filled 2021-01-17: qty 21

## 2021-01-17 MED ORDER — OXALIPLATIN CHEMO INJECTION 100 MG/20ML
85.0000 mg/m2 | Freq: Once | INTRAVENOUS | Status: AC
  Administered 2021-01-17: 180 mg via INTRAVENOUS
  Filled 2021-01-17: qty 36

## 2021-01-17 MED ORDER — SODIUM CHLORIDE 0.9 % IV SOLN
2600.0000 mg/m2 | INTRAVENOUS | Status: DC
  Administered 2021-01-17: 5450 mg via INTRAVENOUS
  Filled 2021-01-17: qty 109

## 2021-01-17 MED ORDER — PALONOSETRON HCL INJECTION 0.25 MG/5ML
0.2500 mg | Freq: Once | INTRAVENOUS | Status: AC
  Administered 2021-01-17: 0.25 mg via INTRAVENOUS
  Filled 2021-01-17: qty 5

## 2021-01-17 MED ORDER — SODIUM CHLORIDE 0.9 % IV SOLN
50.0000 mg/m2 | Freq: Once | INTRAVENOUS | Status: AC
  Administered 2021-01-17: 110 mg via INTRAVENOUS
  Filled 2021-01-17: qty 11

## 2021-01-17 NOTE — Patient Instructions (Signed)
[facility] ONCOL[facility]  Discharge Instructions: Thank you for choosing [facility] to provide your oncology and hematology care.   If you have a lab appointment with the [facility], please go directly to the [facility] and check in at the registration area.   Wear comfortable clothing and clothing appropriate for easy access to any Portacath or PICC line.   We strive to give you quality time with your provider. You may need to reschedule your appointment if you arrive late (15 or more minutes).  Arriving late affects you and other patients whose appointments are after yours.  Also, if you miss three or more appointments without notifying the office, you may be dismissed from the clinic at the provider's discretion.      For prescription refill requests, have your pharmacy contact our office and allow 72 hours for refills to be completed.    Today you received the following chemotherapy and/or immunotherapy agents: Docetaxel, Oxaliplatin, Leucovorin, & Fluorouracil   To help prevent nausea and vomiting after your treatment, we encourage you to take your nausea medication as directed.  BELOW ARE SYMPTOMS THAT SHOULD BE REPORTED IMMEDIATELY: *FEVER GREATER THAN 100.4 F (38 C) OR HIGHER *CHILLS OR SWEATING *NAUSEA AND VOMITING THAT IS NOT CONTROLLED WITH YOUR NAUSEA MEDICATION *UNUSUAL SHORTNESS OF BREATH *UNUSUAL BRUISING OR BLEEDING *URINARY PROBLEMS (pain or burning when urinating, or frequent urination) *BOWEL PROBLEMS (unusual diarrhea, constipation, pain near the anus) TENDERNESS IN MOUTH AND THROAT WITH OR WITHOUT PRESENCE OF ULCERS (sore throat, sores in mouth, or a toothache) UNUSUAL RASH, SWELLING OR PAIN  UNUSUAL VAGINAL DISCHARGE OR ITCHING   Items with * indicate a potential emergency and should be followed up as soon as possible or go to the Emergency Department if any problems should occur.  Please show the CHEMOTHERAPY ALERT CARD or  IMMUNOTHERAPY ALERT CARD at check-in to the Emergency Department and triage nurse.  Should you have questions after your visit or need to cancel or reschedule your appointment, please contact [facility]  Dept: [phone]  and follow the prompts.  Office hours are 8:00 a.m. to 4:30 p.m. Monday - Friday. Please note that voicemails left after 4:00 p.m. may not be returned until the following business day.  We are closed weekends and major holidays. You have access to a nurse at all times for urgent questions. Please call the main number to the clinic Dept: [phone] and follow the prompts.   For any non-urgent questions, you may also contact your provider using MyChart. We now offer e-Visits for anyone 57 and older to request care online for non-urgent symptoms. For details visit [email or web address].   Also download the MyChart app! Go to the app store, search "MyChart", open the app, select [facility], and log in with your MyChart username and password.  Due to Covid, a mask is required upon entering the hospital/clinic. If you do not have a mask, one will be given to you upon arrival. For doctor visits, patients may have 1 support person aged 50 or older with them. For treatment visits, patients cannot have anyone with them due to current Covid guidelines and our immunocompromised population.

## 2021-01-17 NOTE — Progress Notes (Signed)
[place]   [phone] [phone]   Clinic Follow up Note   Patient Care Team: [name], MD as PCP - General (Internal Medicine) [name], [name], MD as PCP - Cardiology (Cardiology) [name], MD as PCP - Electrophysiology (Cardiology) [name], MD as Consulting Physician (General Surgery) Truitt Merle, MD as Consulting Physician (Hematology)  Date of Service:  01/17/2021  CHIEF COMPLAINT: f/u of gastric cancer  CURRENT THERAPY:  Neoadjuvant FLOT4, q2weeks, started 01/02/21  ASSESSMENT & PLAN:  Jon Williams is a 70 y.o. male with   1. Gastric Cancer, cT3N0M0, stage II -initially referred to ED by PCP on 11/30/20 for low hemoglobin of 7.4 on routine lab work. He was also recently treated for H-pylori gastritis. EGD on 12/01/20 by Dr. Benson Norway showed a tumor at the incisura, which pathology confirmed was adenocarcinoma.  -CT CAP 12/01/20 showed no metastatic disease. -He met with Dr. Zenia Resides on 12/05/20, who recommends EUS to determine staging.  -He underwent echo on 10/26 showing reduced EF and severe LV dysfunction. Per Dr. Lovena Le, surgery is recommended only if it is curative given his moderate risk. -He proceeded to EUS on 12/16/20 under Dr. Benson Norway, staging T3 N0. -IHC returned abnormal, showing loss of expression in MLH1 and PMS2. Which predict good response to immunotherapy  -port placed 12/23/20 -he started FLOT4 with dose reduction on 01/02/21. He tolerated the first cycle very well except constipation -we reviewed his medications and when to take them-- dexamethasone for two days after chemo, claritin for the following 5 days after. -labs reviewed, stable to improved, adequate to proceed with cycle 2 today. Will increase to full dose since he tolerated first cycle very well    2. Symptom Management: Constipation -secondary to treatment -we discussed using miralax to manage constipation and how to avoid diarrhea.    3. H/o  Prostate Cancer  -diagnosed with Gleason 4+4 prostate cancer in early 2019. He was treated with long-term ADT in combination with 8 weeks of IMRT.   4. Diabetes, atrial fibrillation, severe cardiomyopathy with EF 20 to 25%, status post ICD placement -Follow-up with PCP and cardiology -We will carefully monitor his heart function during chemotherapy   5. Iron deficient anemia -Secondary to gastric cancer -received 2 doses of IV Venofer, last 12/20/20 -hg stable at 11.1 today (01/17/21), s/p Venofer     PLAN:  -proceed with C2 FLOT4 with full dose              -G-CSF on day 2 -lab, flush, f/u, and C3 FLOT4 on 01/31/21   No problem-specific Assessment & Plan notes found for this encounter.   SUMMARY OF ONCOLOGIC HISTORY: Oncology History Overview Note  Cancer Staging Gastric cancer [facility]) Staging form: Stomach, AJCC 8th Edition - Clinical stage from 12/01/2020: Stage IIB (cT3, cN0, cM0) - Signed by Truitt Merle, MD on 12/20/2020 Stage prefix: Initial diagnosis Total positive nodes: 0  Malignant neoplasm of prostate ([place]) Staging form: Prostate, AJCC 8th Edition - Clinical: Stage IIC (cT2b, cN0, cM0, PSA: 6.7, Grade Group: 4) - Unsigned Prostate specific antigen (PSA) range: Less than 10 Gleason score: 8 Histologic grading system: 5 grade system    Gastric cancer ([place])  12/01/2020 Procedure   Upper Endoscopy, Dr. Benson Norway  Impression: - Normal esophagus. 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 Biopsied. - Normal examined duodenum.   12/01/2020 Pathology Results   FINAL MICROSCOPIC DIAGNOSIS:   A. INCISURA, BIOPSY:  - Adenocarcinoma, moderate to poorly differentiated  arising in a  background of chronic gastritis with intestinal metaplasia.    12/01/2020 Imaging   CT CAP  IMPRESSION: Focal wall thickening along the posterior aspect of the gastric antrum, likely corresponding to the patient's newly diagnosed gastric cancer.   No findings suspicious for metastatic  disease.   Mild multifocal pneumonia, lower lobe predominant, likely on the basis of aspiration. Trace right pleural effusion.   Fiducial markers along the prostate in this patient with known prostate cancer.   12/01/2020 Cancer Staging   Staging form: Stomach, AJCC 8th Edition - Clinical stage from 12/01/2020: Stage IIB (cT3, cN0, cM0) - Signed by Truitt Merle, MD on 12/20/2020 Stage prefix: Initial diagnosis Total positive nodes: 0    12/08/2020 Initial Diagnosis   Gastric cancer ([place])   12/16/2020 Procedure   EUS  - Wall thickening was seen in the lesser curve of the stomach and in the antrum of the stomach. The thickening appeared to be primarily within the serosa (Layer 5). T3 N0 Mx. - There was no sign of significant pathology in the entire pancreas. - There was no sign of significant pathology in the common bile duct and in the gallbladder. - There was no evidence of significant pathology in the left lobe of the liver. - Endosonographic images of the left adrenal gland were unremarkable. - The celiac trunk and superior mesenteric artery were endosonographically normal. - The mediastinum was unremarkable endosonographically. - No specimens collected.   01/02/2021 -  Chemotherapy   Patient is on Treatment Plan : GASTROESOPHAGEAL FLOT q14d X 4 cycles        INTERVAL HISTORY:  Jon Williams is here for a follow up of gastric cancer. He was last seen by me on 01/09/21. He presents to the clinic alone. He reports he is doing well overall, "ready to roll!" He notes he had some constipation following infusion, managed with colace/miralax.  He reports he wants to get back into exercising regularly. He has a son and a daughter, as well as a grandson and two granddaughters.   All other systems were reviewed with the patient and are negative.  MEDICAL HISTORY:  Past Medical History:  Diagnosis Date   AICD (automatic cardioverter/defibrillator) present 05/25/2016   biv icd    Bunion, right    Chronic combined systolic and diastolic CHF (congestive heart failure) ([place])    Echo 1/18: Mild conc LVH, EF 15-20, severe diff HK, inf and inf-septal AK, Gr 3 DD, mild to mod MR, severe LAE, mod reduced RVSF, mod RAE, mild TR, PASP 50   Coronary artery disease involving native coronary artery without angina pectoris 04/17/2016   LHC 1/18: pLCx 2, mLCx 20, mRCA 40, dRCA 20, LVEDP 23, mean RA 8, PA 42/20, PCWP 17   Diabetes mellitus without complication (HCC)    DJD (degenerative joint disease)    History of atrial fibrillation    History of atrial flutter    History of cardiomegaly 06/07/2016   Noted on CXR   History of colon polyps 06/28/2017   Noted on colonoscopy   LBBB (left bundle branch block)    NICM (nonischemic cardiomyopathy) ([place])    Echo 1/18:  Mild conc LVH, EF 15-20, severe diff HK, inf and inf-septal AK, Gr 3 DD, mild to mod MR, severe LAE, mod reduced RVSF, mod RAE, mild TR, PASP 50   OA (osteoarthritis)    knee   Other secondary pulmonary hypertension ([place]) 04/17/2016   Prostate cancer ([place]) 2019   Sigmoid  diverticulosis 06/28/2017   Noted on colonoscopy    SURGICAL HISTORY: Past Surgical History:  Procedure Laterality Date   BIOPSY  12/01/2020   Procedure: BIOPSY;  Surgeon: [name], MD;  Location: [place];  Service: Endoscopy;;   BIV ICD INSERTION CRT-D N/A 05/25/2016   Procedure: BiV ICD Insertion CRT-D;  Surgeon: [name], MD;  Location: [place] [facility];  Service: Cardiovascular;  Laterality: N/A;   CARDIAC CATHETERIZATION N/A 03/02/2016   Procedure: Right/Left Heart Cath and Coronary Angiography;  Surgeon: Nelva Bush, MD;  Location: [place] [facility];  Service: Cardiovascular;  Laterality: N/A;   CARDIOVERSION N/A 07/17/2016   Procedure: Cardioversion;  Surgeon: [name], MD;  Location: [place] [facility];  Service: Cardiovascular;  Laterality: N/A;   COLONOSCOPY WITH PROPOFOL N/A 06/28/2017   Procedure: COLONOSCOPY  WITH PROPOFOL;  Surgeon: [name], MD;  Location: [facility];  Service: Endoscopy;  Laterality: N/A;   colonscopy  2009   ESOPHAGOGASTRODUODENOSCOPY N/A 12/01/2020   Procedure: ESOPHAGOGASTRODUODENOSCOPY (EGD);  Surgeon: [name], MD;  Location: [place];  Service: Endoscopy;  Laterality: N/A;  IDA/guaiac positive stools   ESOPHAGOGASTRODUODENOSCOPY (EGD) WITH PROPOFOL N/A 12/16/2020   Procedure: ESOPHAGOGASTRODUODENOSCOPY (EGD) WITH PROPOFOL;  Surgeon: [name], MD;  Location: [facility];  Service: Endoscopy;  Laterality: N/A;   GOLD SEED IMPLANT N/A 12/24/2017   Procedure: GOLD SEED IMPLANT, TRANSERINEAL;  Surgeon: Festus Aloe, MD;  Location: [facility];  Service: Urology;  Laterality: N/A;   INSERT / REPLACE / REMOVE PACEMAKER     LEAD REVISION  10/10/2018   LEAD REVISION/REPAIR N/A 10/10/2018   Procedure: LEAD REVISION/REPAIR;  Surgeon: [name], MD;  Location: [place] [facility];  Service: Cardiovascular;  Laterality: N/A;   POLYPECTOMY  06/28/2017   Procedure: POLYPECTOMY;  Surgeon: [name], MD;  [facility]: [facility];  Service: Endoscopy;;  ascending and descending colon polyp   PORTACATH PLACEMENT Right 12/23/2020   Procedure: INSERTION PORT-A-CATH;  Surgeon: [name], MD;  Location: [place];  Service: General;  Laterality: Right;   PROSTATE BIOPSY  02/20/2017   SPACE OAR INSTILLATION N/A 12/24/2017   Procedure: SPACE OAR INSTILLATION;  Surgeon: Festus Aloe, MD;  Location: [facility];  Service: Urology;  Laterality: N/A;   TOTAL KNEE ARTHROPLASTY Right 08/16/2017   Procedure: RIGHT TOTAL KNEE ARTHROPLASTY;  Surgeon: [name], MD;  Location: [place];  Service: Orthopedics;  Laterality: Right;   UPPER ESOPHAGEAL ENDOSCOPIC ULTRASOUND (EUS) N/A 12/16/2020   Procedure: UPPER ESOPHAGEAL ENDOSCOPIC ULTRASOUND (EUS);  Surgeon: [name], MD;  Location: [facility];  Service: Endoscopy;  Laterality: N/A;    I have reviewed the social history  and family history with the patient and they are unchanged from previous note.  ALLERGIES:  is allergic to aspirin, sulfa antibiotics, and other.  MEDICATIONS:  Current Outpatient Medications  Medication Sig Dispense Refill   atorvastatin (LIPITOR) 40 MG tablet TAKE 1 TABLET(40 MG) BY MOUTH DAILY 90 tablet 0   carvedilol (COREG) 3.125 MG tablet TAKE 1 TABLET(3.125 MG) BY MOUTH TWICE DAILY 180 tablet 0   cholecalciferol (VITAMIN D3) 25 MCG (1000 UT) tablet Take 1,000 Units by mouth daily with lunch.      dexamethasone (DECADRON) 4 MG tablet Take 2 tablets (8 mg total) by mouth daily. Take 1 tab daily starting the day after chemotherapy for 2 days. Take with food. 30 tablet 1   docusate sodium (COLACE) 100 MG capsule Take 1 capsule (100 mg total) by mouth 2 (  two) times daily as needed for mild constipation. 20 capsule 0   furosemide (LASIX) 40 MG tablet Take 1 tablet (40 mg total) by mouth 2 (two) times daily. 180 tablet 3   lidocaine-prilocaine (EMLA) cream Apply to affected area once 30 g 3   metFORMIN (GLUCOPHAGE-XR) 500 MG 24 hr tablet Take 500 mg by mouth every evening.     Multiple Vitamin (MULTIVITAMIN WITH MINERALS) TABS tablet Take 1 tablet by mouth daily with lunch. One-A-Day     Naphazoline HCl (CLEAR EYES OP) Place 1 drop into both eyes daily as needed (irritation).     ondansetron (ZOFRAN) 4 MG tablet Take 1 tablet (4 mg total) by mouth every 6 (six) hours as needed for nausea. 20 tablet 0   ondansetron (ZOFRAN) 8 MG tablet Take 1 tablet (8 mg total) by mouth 2 (two) times daily as needed for refractory nausea / vomiting. Start on day 3 after chemotherapy. 30 tablet 1   pantoprazole (PROTONIX) 40 MG tablet Take 1 tablet (40 mg total) by mouth 2 (two) times daily. 60 tablet 0   polyethylene glycol (MIRALAX / GLYCOLAX) 17 g packet Take 17 g by mouth daily as needed for moderate constipation. 14 each 0   potassium chloride (KLOR-CON) 10 MEQ tablet TAKE 1 TABLET(10 MEQ) BY MOUTH TWICE  DAILY 180 tablet 3   prochlorperazine (COMPAZINE) 10 MG tablet Take 1 tablet (10 mg total) by mouth every 6 (six) hours as needed (Nausea or vomiting). 30 tablet 1   sacubitril-valsartan (ENTRESTO) 24-26 MG Take 1 tablet by mouth 2 (two) times daily. 180 tablet 3   sotalol (BETAPACE) 80 MG tablet Take 120 mg by mouth in the morning and at bedtime.     No current facility-administered medications for this visit.   Facility-Administered Medications Ordered in Other Visits  Medication Dose Route Frequency Provider Last Rate Last Admin   fluorouracil (ADRUCIL) 5,450 mg in sodium chloride 0.9 % 141 mL chemo infusion  2,600 mg/m2 (Treatment Plan Recorded) Intravenous 1 day or 1 dose Truitt Merle, MD   5,450 mg at 01/17/21 1508   sodium chloride flush (NS) 0.9 % injection 10 mL  10 mL Intracatheter PRN Truitt Merle, MD        PHYSICAL EXAMINATION: ECOG PERFORMANCE STATUS: 1 - Symptomatic but completely ambulatory  Vitals:   01/17/21 0929  BP: 114/78  Pulse: 89  Resp: 17  Temp: 98.4 F (36.9 C)  SpO2: 100%   Wt Readings from Last 3 Encounters:  01/17/21 206 lb 7 oz (93.6 kg)  01/09/21 207 lb (93.9 kg)  01/06/21 204 lb (92.5 kg)     GENERAL:alert, no distress and comfortable SKIN: skin color normal, no rashes or significant lesions EYES: normal, Conjunctiva are pink and non-injected, sclera clear  NEURO: alert & oriented x 3 with fluent speech  LABORATORY DATA:  I have reviewed the data as listed CBC Latest Ref Rng & Units 01/17/2021 01/09/2021 01/06/2021  WBC 4.0 - 10.5 K/uL 9.2 12.4(H) 21.8(H)  Hemoglobin 13.0 - 17.0 g/dL 11.1(L) 10.9(L) 11.5(L)  Hematocrit 39.0 - 52.0 % 34.0(L) 34.6(L) 36.9(L)  Platelets 150 - 400 K/uL 163 159 156     CMP Latest Ref Rng & Units 01/17/2021 01/09/2021 01/06/2021  Glucose 70 - 99 mg/dL 94 107(H) 121(H)  BUN 8 - 23 mg/dL 18 18 23   Creatinine 0.61 - 1.24 mg/dL 1.21 1.15 1.09  Sodium 135 - 145 mmol/L 139 139 136  Potassium 3.5 - 5.1 mmol/L 3.9 3.9 3.7  Chloride 98 - 111 mmol/L 107 106 102  CO2 22 - 32 mmol/L 22 24 24   Calcium 8.9 - 10.3 mg/dL 8.7(L) 8.6(L) 9.0  Total Protein 6.5 - 8.1 g/dL 6.6 6.9 -  Total Bilirubin 0.3 - 1.2 mg/dL 0.4 0.6 -  Alkaline Phos 38 - 126 U/L 71 74 -  AST 15 - 41 U/L 21 18 -  ALT 0 - 44 U/L 25 16 -      RADIOGRAPHIC STUDIES: I have personally reviewed the radiological images as listed and agreed with the findings in the report. No results found.    No orders of the defined types were placed in this encounter.  All questions were answered. The patient knows to call the clinic with any problems, questions or concerns. No barriers to learning was detected. The total time spent in the appointment was 30 minutes.     Truitt Merle, MD 01/17/2021   I, Wilburn Mylar, am acting as scribe for Truitt Merle, MD.   I have reviewed the above documentation for accuracy and completeness, and I agree with the above.

## 2021-01-17 NOTE — Progress Notes (Signed)
Verified with Dr Burr Medico full dose chemo today.

## 2021-01-17 NOTE — Progress Notes (Signed)
Remote ICD transmission.   

## 2021-01-18 ENCOUNTER — Inpatient Hospital Stay: Payer: Medicare HMO

## 2021-01-18 VITALS — BP 146/89 | HR 74 | Temp 98.4°F | Resp 18

## 2021-01-18 DIAGNOSIS — C163 Malignant neoplasm of pyloric antrum: Secondary | ICD-10-CM

## 2021-01-18 DIAGNOSIS — Z5189 Encounter for other specified aftercare: Secondary | ICD-10-CM | POA: Diagnosis not present

## 2021-01-18 DIAGNOSIS — D508 Other iron deficiency anemias: Secondary | ICD-10-CM | POA: Diagnosis not present

## 2021-01-18 DIAGNOSIS — C160 Malignant neoplasm of cardia: Secondary | ICD-10-CM | POA: Diagnosis not present

## 2021-01-18 DIAGNOSIS — Z5111 Encounter for antineoplastic chemotherapy: Secondary | ICD-10-CM | POA: Diagnosis not present

## 2021-01-18 DIAGNOSIS — Z95828 Presence of other vascular implants and grafts: Secondary | ICD-10-CM

## 2021-01-18 MED ORDER — HEPARIN SOD (PORK) LOCK FLUSH 100 UNIT/ML IV SOLN
500.0000 [IU] | Freq: Once | INTRAVENOUS | Status: AC
  Administered 2021-01-18: 500 [IU] via INTRAVENOUS

## 2021-01-18 MED ORDER — SODIUM CHLORIDE 0.9% FLUSH
10.0000 mL | INTRAVENOUS | Status: DC | PRN
  Administered 2021-01-18: 10 mL via INTRAVENOUS

## 2021-01-18 MED ORDER — PEGFILGRASTIM 6 MG/0.6ML ~~LOC~~ PSKT
6.0000 mg | PREFILLED_SYRINGE | Freq: Once | SUBCUTANEOUS | Status: AC
  Administered 2021-01-18: 6 mg via SUBCUTANEOUS
  Filled 2021-01-18: qty 0.6

## 2021-01-31 ENCOUNTER — Inpatient Hospital Stay: Payer: Medicare HMO | Admitting: Hematology

## 2021-01-31 ENCOUNTER — Inpatient Hospital Stay: Payer: Medicare HMO

## 2021-01-31 ENCOUNTER — Other Ambulatory Visit: Payer: Self-pay

## 2021-01-31 ENCOUNTER — Encounter: Payer: Self-pay | Admitting: Hematology

## 2021-01-31 ENCOUNTER — Inpatient Hospital Stay: Payer: Medicare HMO | Admitting: Nutrition

## 2021-01-31 VITALS — BP 115/70 | HR 77 | Temp 97.8°F | Resp 18 | Ht 66.0 in | Wt 205.2 lb

## 2021-01-31 DIAGNOSIS — C163 Malignant neoplasm of pyloric antrum: Secondary | ICD-10-CM

## 2021-01-31 DIAGNOSIS — D508 Other iron deficiency anemias: Secondary | ICD-10-CM | POA: Diagnosis not present

## 2021-01-31 DIAGNOSIS — Z95828 Presence of other vascular implants and grafts: Secondary | ICD-10-CM

## 2021-01-31 DIAGNOSIS — C160 Malignant neoplasm of cardia: Secondary | ICD-10-CM | POA: Diagnosis not present

## 2021-01-31 DIAGNOSIS — D500 Iron deficiency anemia secondary to blood loss (chronic): Secondary | ICD-10-CM

## 2021-01-31 DIAGNOSIS — Z5111 Encounter for antineoplastic chemotherapy: Secondary | ICD-10-CM | POA: Diagnosis not present

## 2021-01-31 DIAGNOSIS — Z5189 Encounter for other specified aftercare: Secondary | ICD-10-CM | POA: Diagnosis not present

## 2021-01-31 LAB — CBC WITH DIFFERENTIAL/PLATELET
Abs Immature Granulocytes: 0.2 10*3/uL — ABNORMAL HIGH (ref 0.00–0.07)
Basophils Absolute: 0 10*3/uL (ref 0.0–0.1)
Basophils Relative: 0 %
Eosinophils Absolute: 0 10*3/uL (ref 0.0–0.5)
Eosinophils Relative: 0 %
HCT: 35.8 % — ABNORMAL LOW (ref 39.0–52.0)
Hemoglobin: 11.2 g/dL — ABNORMAL LOW (ref 13.0–17.0)
Immature Granulocytes: 1 %
Lymphocytes Relative: 5 %
Lymphs Abs: 0.8 10*3/uL (ref 0.7–4.0)
MCH: 26.2 pg (ref 26.0–34.0)
MCHC: 31.3 g/dL (ref 30.0–36.0)
MCV: 83.6 fL (ref 80.0–100.0)
Monocytes Absolute: 0.5 10*3/uL (ref 0.1–1.0)
Monocytes Relative: 3 %
Neutro Abs: 14.6 10*3/uL — ABNORMAL HIGH (ref 1.7–7.7)
Neutrophils Relative %: 91 %
Platelets: 141 10*3/uL — ABNORMAL LOW (ref 150–400)
RBC: 4.28 MIL/uL (ref 4.22–5.81)
RDW: 29.6 % — ABNORMAL HIGH (ref 11.5–15.5)
WBC: 16.2 10*3/uL — ABNORMAL HIGH (ref 4.0–10.5)
nRBC: 0.3 % — ABNORMAL HIGH (ref 0.0–0.2)

## 2021-01-31 LAB — IRON AND IRON BINDING CAPACITY (CC-WL,HP ONLY)
Iron: 112 ug/dL (ref 45–182)
Saturation Ratios: 37 % (ref 17.9–39.5)
TIBC: 305 ug/dL (ref 250–450)
UIBC: 193 ug/dL (ref 117–376)

## 2021-01-31 LAB — COMPREHENSIVE METABOLIC PANEL
ALT: 20 U/L (ref 0–44)
AST: 19 U/L (ref 15–41)
Albumin: 4 g/dL (ref 3.5–5.0)
Alkaline Phosphatase: 66 U/L (ref 38–126)
Anion gap: 9 (ref 5–15)
BUN: 21 mg/dL (ref 8–23)
CO2: 24 mmol/L (ref 22–32)
Calcium: 9 mg/dL (ref 8.9–10.3)
Chloride: 106 mmol/L (ref 98–111)
Creatinine, Ser: 1.18 mg/dL (ref 0.61–1.24)
GFR, Estimated: >60 mL/min (ref >60)
Glucose, Bld: 112 mg/dL — ABNORMAL HIGH (ref 70–99)
Potassium: 3.8 mmol/L (ref 3.5–5.1)
Sodium: 139 mmol/L (ref 135–145)
Total Bilirubin: 0.5 mg/dL (ref 0.3–1.2)
Total Protein: 6.5 g/dL (ref 6.5–8.1)

## 2021-01-31 LAB — FERRITIN: Ferritin: 219 ng/mL (ref 24–336)

## 2021-01-31 LAB — CEA (IN HOUSE-CHCC): CEA (CHCC-In House): 4.67 ng/mL (ref 0.00–5.00)

## 2021-01-31 MED ORDER — DEXTROSE 5 % IV SOLN: Freq: Once | INTRAVENOUS | Status: AC

## 2021-01-31 MED ORDER — SODIUM CHLORIDE 0.9 % IV SOLN
10.0000 mg | Freq: Once | INTRAVENOUS | Status: AC
  Administered 2021-01-31: 11:00:00 10 mg via INTRAVENOUS
  Filled 2021-01-31: qty 10

## 2021-01-31 MED ORDER — LEUCOVORIN CALCIUM INJECTION 350 MG
200.0000 mg/m2 | Freq: Once | INTRAVENOUS | Status: AC
  Administered 2021-01-31: 14:00:00 420 mg via INTRAVENOUS
  Filled 2021-01-31: qty 21

## 2021-01-31 MED ORDER — SODIUM CHLORIDE 0.9 % IV SOLN
2600.0000 mg/m2 | INTRAVENOUS | Status: DC
  Administered 2021-01-31: 16:00:00 5450 mg via INTRAVENOUS
  Filled 2021-01-31: qty 109

## 2021-01-31 MED ORDER — SODIUM CHLORIDE 0.9% FLUSH
10.0000 mL | INTRAVENOUS | Status: DC | PRN
  Administered 2021-01-31: 10:00:00 10 mL via INTRAVENOUS

## 2021-01-31 MED ORDER — PALONOSETRON HCL INJECTION 0.25 MG/5ML
0.2500 mg | Freq: Once | INTRAVENOUS | Status: AC
  Administered 2021-01-31: 11:00:00 0.25 mg via INTRAVENOUS
  Filled 2021-01-31: qty 5

## 2021-01-31 MED ORDER — OXALIPLATIN CHEMO INJECTION 100 MG/20ML
85.0000 mg/m2 | Freq: Once | INTRAVENOUS | Status: AC
  Administered 2021-01-31: 14:00:00 180 mg via INTRAVENOUS
  Filled 2021-01-31: qty 36

## 2021-01-31 MED ORDER — SODIUM CHLORIDE 0.9 % IV SOLN
50.0000 mg/m2 | Freq: Once | INTRAVENOUS | Status: AC
  Administered 2021-01-31: 12:00:00 110 mg via INTRAVENOUS
  Filled 2021-01-31: qty 11

## 2021-01-31 NOTE — Progress Notes (Signed)
Jon Williams   Telephone:([phone]) [phone] Fax:(336) [phone]   Clinic Follow up Note   Patient Care Team: [name], MD as PCP - General (Internal Medicine) [name], [name], MD as PCP - Cardiology (Cardiology) [name], MD as PCP - Electrophysiology (Cardiology) [name], MD as Consulting Physician (General Surgery) Truitt Merle, MD as Consulting Physician (Hematology)  Date of Service:  01/31/2021  CHIEF COMPLAINT: f/u of gastric cancer  CURRENT THERAPY:  Neoadjuvant FLOT4, q2weeks, started 01/02/21  ASSESSMENT & PLAN:  Jon Williams is a 70 y.o. male with   1. Gastric Cancer, cT3N0M0, stage II -initially referred to ED by PCP on 11/30/20 for low hemoglobin of 7.4 on routine lab work. He was also recently treated for H-pylori gastritis. EGD on 12/01/20 by Dr. Benson Norway showed a tumor at the incisura, which pathology confirmed was adenocarcinoma.  -CT CAP 12/01/20 showed no metastatic disease. -He met with Dr. Zenia Resides on 12/05/20. -He underwent echo on 10/26 showing reduced EF and severe LV dysfunction. Per Dr. Lovena Le, surgery is recommended only if it is curative given his moderate risk. -He proceeded to EUS on 12/16/20 under Dr. Benson Norway, staging T3 N0. -IHC returned abnormal, showing loss of expression in MLH1 and PMS2. Which predict good response to immunotherapy  -port placed 12/23/20 -he started FLOT4 with dose reduction on 01/02/21. He tolerated the first cycle very well except constipation, he tolerated the full dose second cycle also very well  -labs reviewed, stable to improved. He continues to tolerate treatment very well.    2. H/o Prostate Cancer  -diagnosed with Gleason 4+4 prostate cancer in early 2019. He was treated with long-term ADT in combination with 8 weeks of IMRT.   3. Diabetes, atrial fibrillation, severe cardiomyopathy with EF 20 to 25%, status post ICD placement -Follow-up with PCP and cardiology -We will carefully monitor his heart  function during chemotherapy   4. Iron deficient anemia -Secondary to gastric cancer -received 2 doses of IV Venofer, last 12/20/20 -hg stable at 11.2 today (01/31/21)     PLAN:  -proceed with C3 FLOT4 with full dose              -G-CSF on day 2 -lab, flush, f/u, and C4 FLOT4 on 02/16/20   No problem-specific Assessment & Plan notes found for this encounter.   SUMMARY OF ONCOLOGIC HISTORY: Oncology History Overview Note  Cancer Staging Gastric cancer [facility]) Staging form: Stomach, AJCC 8th Edition - Clinical stage from 12/01/2020: Stage IIB (cT3, cN0, cM0) - Signed by Truitt Merle, MD on 12/20/2020 Stage prefix: Initial diagnosis Total positive nodes: 0  Malignant neoplasm of prostate ([place]) Staging form: Prostate, AJCC 8th Edition - Clinical: Stage IIC (cT2b, cN0, cM0, PSA: 6.7, Grade Group: 4) - Unsigned Prostate specific antigen (PSA) range: Less than 10 Gleason score: 8 Histologic grading system: 5 grade system    Gastric cancer ([place])  12/01/2020 Procedure   Upper Endoscopy, Dr. Benson Norway  Impression: - Normal esophagus. 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 Biopsied. - Normal examined duodenum.   12/01/2020 Pathology Results   FINAL MICROSCOPIC DIAGNOSIS:   A. INCISURA, BIOPSY:  - Adenocarcinoma, moderate to poorly differentiated arising in a  background of chronic gastritis with intestinal metaplasia.    12/01/2020 Imaging   CT CAP  IMPRESSION: Focal wall thickening along the posterior aspect of the gastric antrum, likely corresponding to the patient's newly diagnosed gastric cancer.   No findings suspicious for metastatic disease.   Mild multifocal  pneumonia, lower lobe predominant, likely on the basis of aspiration. Trace right pleural effusion.   Fiducial markers along the prostate in this patient with known prostate cancer.   12/01/2020 Cancer Staging   Staging form: Stomach, AJCC 8th Edition - Clinical stage from 12/01/2020: Stage IIB (cT3, cN0,  cM0) - Signed by Truitt Merle, MD on 12/20/2020 Stage prefix: Initial diagnosis Total positive nodes: 0    12/08/2020 Initial Diagnosis   Gastric cancer ([place])   12/16/2020 Procedure   EUS  - Wall thickening was seen in the lesser curve of the stomach and in the antrum of the stomach. The thickening appeared to be primarily within the serosa (Layer 5). T3 N0 Mx. - There was no sign of significant pathology in the entire pancreas. - There was no sign of significant pathology in the common bile duct and in the gallbladder. - There was no evidence of significant pathology in the left lobe of the liver. - Endosonographic images of the left adrenal gland were unremarkable. - The celiac trunk and superior mesenteric artery were endosonographically normal. - The mediastinum was unremarkable endosonographically. - No specimens collected.   01/02/2021 -  Chemotherapy   Patient is on Treatment Plan : GASTROESOPHAGEAL FLOT q14d X 4 cycles        INTERVAL HISTORY:  Jon Williams is here for a follow up of gastric cancer. He was last seen by me on 01/17/21. He presents to the clinic alone. He notes his wife is in the waiting room. He reports he is "ready to roll" today. He reports he is tolerating treatment well. He notes he continues to stay active and exercise at home inside.   All other systems were reviewed with the patient and are negative.  MEDICAL HISTORY:  Past Medical History:  Diagnosis Date   AICD (automatic cardioverter/defibrillator) present 05/25/2016   biv icd   Bunion, right    Chronic combined systolic and diastolic CHF (congestive heart failure) ([place])    Echo 1/18: Mild conc LVH, EF 15-20, severe diff HK, inf and inf-septal AK, Gr 3 DD, mild to mod MR, severe LAE, mod reduced RVSF, mod RAE, mild TR, PASP 50   Coronary artery disease involving native coronary artery without angina pectoris 04/17/2016   LHC 1/18: pLCx 22, mLCx 20, mRCA 40, dRCA 20, LVEDP 23, mean RA 8, PA 42/20,  PCWP 17   Diabetes mellitus without complication (HCC)    DJD (degenerative joint disease)    History of atrial fibrillation    History of atrial flutter    History of cardiomegaly 06/07/2016   Noted on CXR   History of colon polyps 06/28/2017   Noted on colonoscopy   LBBB (left bundle branch block)    NICM (nonischemic cardiomyopathy) ([place])    Echo 1/18:  Mild conc LVH, EF 15-20, severe diff HK, inf and inf-septal AK, Gr 3 DD, mild to mod MR, severe LAE, mod reduced RVSF, mod RAE, mild TR, PASP 50   OA (osteoarthritis)    knee   Other secondary pulmonary hypertension ([place]) 04/17/2016   Prostate cancer ([place]) 2019   Sigmoid diverticulosis 06/28/2017   Noted on colonoscopy    SURGICAL HISTORY: Past Surgical History:  Procedure Laterality Date   BIOPSY  12/01/2020   Procedure: BIOPSY;  Surgeon: [name], MD;  Location: [facility];  Service: Endoscopy;;   BIV ICD INSERTION CRT-D N/A 05/25/2016   Procedure: BiV ICD Insertion CRT-D;  Surgeon: [name], MD;  Location: [facility]  [facility];  Service: Cardiovascular;  Laterality: N/A;   CARDIAC CATHETERIZATION N/A 03/02/2016   Procedure: Right/Left Heart Cath and Coronary Angiography;  Surgeon: Nelva Bush, MD;  Location: [place] [facility];  Service: Cardiovascular;  Laterality: N/A;   CARDIOVERSION N/A 07/17/2016   Procedure: Cardioversion;  Surgeon: [name], MD;  Location: [facility];  Service: Cardiovascular;  Laterality: N/A;   COLONOSCOPY WITH PROPOFOL N/A 06/28/2017   Procedure: COLONOSCOPY WITH PROPOFOL;  Surgeon: [name], MD;  [facility];  Service: Endoscopy;  Laterality: N/A;   colonscopy  2009   ESOPHAGOGASTRODUODENOSCOPY N/A 12/01/2020   Procedure: ESOPHAGOGASTRODUODENOSCOPY (EGD);  Surgeon: [name], MD;  Location: [facility];  Service: Endoscopy;  Laterality: N/A;  IDA/guaiac positive stools   ESOPHAGOGASTRODUODENOSCOPY (EGD) WITH PROPOFOL N/A 12/16/2020   Procedure:  ESOPHAGOGASTRODUODENOSCOPY (EGD) WITH PROPOFOL;  Surgeon: [name], MD;  [facility];  Service: Endoscopy;  Laterality: N/A;   GOLD SEED IMPLANT N/A 12/24/2017   Procedure: GOLD SEED IMPLANT, TRANSERINEAL;  Surgeon: Festus Aloe, MD;  [facility];  Service: Urology;  Laterality: N/A;   INSERT / REPLACE / REMOVE PACEMAKER     LEAD REVISION  10/10/2018   LEAD REVISION/REPAIR N/A 10/10/2018   Procedure: LEAD REVISION/REPAIR;  Surgeon: [name], MD;  Location: [facility];  Service: Cardiovascular;  Laterality: N/A;   POLYPECTOMY  06/28/2017   Procedure: POLYPECTOMY;  Surgeon: [name], MD;  [facility];  Service: Endoscopy;;  ascending and descending colon polyp   PORTACATH PLACEMENT Right 12/23/2020   Procedure: INSERTION PORT-A-CATH;  Surgeon: [name], MD;  Location: [facility];  Service: General;  Laterality: Right;   PROSTATE BIOPSY  02/20/2017   SPACE OAR INSTILLATION N/A 12/24/2017   Procedure: SPACE OAR INSTILLATION;  Surgeon: Festus Aloe, MD;  [facility];  Service: Urology;  Laterality: N/A;   TOTAL KNEE ARTHROPLASTY Right 08/16/2017   Procedure: RIGHT TOTAL KNEE ARTHROPLASTY;  Surgeon: [name], MD;  Location: [facility];  Service: Orthopedics;  Laterality: Right;   UPPER ESOPHAGEAL ENDOSCOPIC ULTRASOUND (EUS) N/A 12/16/2020   Procedure: UPPER ESOPHAGEAL ENDOSCOPIC ULTRASOUND (EUS);  Surgeon: [name], MD;  Location: [facility];  Service: Endoscopy;  Laterality: N/A;    I have reviewed the social history and family history with the patient and they are unchanged from previous note.  ALLERGIES:  is allergic to aspirin, sulfa antibiotics, and other.  MEDICATIONS:  Current Outpatient Medications  Medication Sig Dispense Refill   atorvastatin (LIPITOR) 40 MG tablet TAKE 1 TABLET(40 MG) BY MOUTH DAILY 90 tablet 0   carvedilol (COREG) 3.125 MG tablet TAKE 1 TABLET(3.125 MG) BY MOUTH TWICE DAILY 180 tablet 0    cholecalciferol (VITAMIN D3) 25 MCG (1000 UT) tablet Take 1,000 Units by mouth daily with lunch.      dexamethasone (DECADRON) 4 MG tablet Take 2 tablets (8 mg total) by mouth daily. Take 1 tab daily starting the day after chemotherapy for 2 days. Take with food. 30 tablet 1   docusate sodium (COLACE) 100 MG capsule Take 1 capsule (100 mg total) by mouth 2 (two) times daily as needed for mild constipation. 20 capsule 0   furosemide (LASIX) 40 MG tablet Take 1 tablet (40 mg total) by mouth 2 (two) times daily. 180 tablet 3   lidocaine-prilocaine (EMLA) cream Apply to affected area once 30 g 3   metFORMIN (GLUCOPHAGE-XR) 500 MG 24 hr tablet Take 500 mg by mouth every evening.  Multiple Vitamin (MULTIVITAMIN WITH MINERALS) TABS tablet Take 1 tablet by mouth daily with lunch. One-A-Day     Naphazoline HCl (CLEAR EYES OP) Place 1 drop into both eyes daily as needed (irritation).     ondansetron (ZOFRAN) 4 MG tablet Take 1 tablet (4 mg total) by mouth every 6 (six) hours as needed for nausea. 20 tablet 0   ondansetron (ZOFRAN) 8 MG tablet Take 1 tablet (8 mg total) by mouth 2 (two) times daily as needed for refractory nausea / vomiting. Start on day 3 after chemotherapy. 30 tablet 1   pantoprazole (PROTONIX) 40 MG tablet Take 1 tablet (40 mg total) by mouth 2 (two) times daily. 60 tablet 0   polyethylene glycol (MIRALAX / GLYCOLAX) 17 g packet Take 17 g by mouth daily as needed for moderate constipation. 14 each 0   potassium chloride (KLOR-CON) 10 MEQ tablet TAKE 1 TABLET(10 MEQ) BY MOUTH TWICE DAILY 180 tablet 3   prochlorperazine (COMPAZINE) 10 MG tablet Take 1 tablet (10 mg total) by mouth every 6 (six) hours as needed (Nausea or vomiting). 30 tablet 1   sacubitril-valsartan (ENTRESTO) 24-26 MG Take 1 tablet by mouth 2 (two) times daily. 180 tablet 3   sotalol (BETAPACE) 80 MG tablet Take 120 mg by mouth in the morning and at bedtime.     No current facility-administered medications for this visit.    Facility-Administered Medications Ordered in Other Visits  Medication Dose Route Frequency Provider Last Rate Last Admin   dexamethasone (DECADRON) 10 mg in sodium chloride 0.9 % 50 mL IVPB  10 mg Intravenous Once Truitt Merle, MD       DOCEtaxel (TAXOTERE) 110 mg in sodium chloride 0.9 % 250 mL chemo infusion  50 mg/m2 (Treatment Plan Recorded) Intravenous Once Truitt Merle, MD       fluorouracil (ADRUCIL) 5,450 mg in sodium chloride 0.9 % 141 mL chemo infusion  2,600 mg/m2 (Treatment Plan Recorded) Intravenous 1 day or 1 dose Truitt Merle, MD       leucovorin 420 mg in dextrose 5 % 250 mL infusion  200 mg/m2 (Treatment Plan Recorded) Intravenous Once Truitt Merle, MD       oxaliplatin (ELOXATIN) 180 mg in dextrose 5 % 500 mL chemo infusion  85 mg/m2 (Treatment Plan Recorded) Intravenous Once Truitt Merle, MD       palonosetron (ALOXI) injection 0.25 mg  0.25 mg Intravenous Once Truitt Merle, MD        PHYSICAL EXAMINATION: ECOG PERFORMANCE STATUS: 1 - Symptomatic but completely ambulatory  Vitals:   01/31/21 1019  BP: 115/70  Pulse: 77  Resp: 18  Temp: 97.8 F (36.6 C)  SpO2: 100%   Wt Readings from Last 3 Encounters:  01/31/21 205 lb 3.2 oz (93.1 kg)  01/17/21 206 lb 7 oz (93.6 kg)  01/09/21 207 lb (93.9 kg)     GENERAL:alert, no distress and comfortable SKIN: skin color normal, no rashes or significant lesions EYES: normal, Conjunctiva are pink and non-injected, sclera clear  NEURO: alert & oriented x 3 with fluent speech  LABORATORY DATA:  I have reviewed the data as listed CBC Latest Ref Rng & Units 01/31/2021 01/17/2021 01/09/2021  WBC 4.0 - 10.5 K/uL 16.2(H) 9.2 12.4(H)  Hemoglobin 13.0 - 17.0 g/dL 11.2(L) 11.1(L) 10.9(L)  Hematocrit 39.0 - 52.0 % 35.8(L) 34.0(L) 34.6(L)  Platelets 150 - 400 K/uL 141(L) 163 159     CMP Latest Ref Rng & Units 01/31/2021 01/17/2021 01/09/2021  Glucose 70 - 99  mg/dL 112(H) 94 107(H)  BUN 8 - 23 mg/dL _0 Creatinine 0.61 - 1.24 mg/dL 1.18 1.21  1.15  Sodium 135 - 145 mmol/L 139 139 139  Potassium 3.5 - 5.1 mmol/L 3.8 3.9 3.9  Chloride 98 - 111 mmol/L 106 107 106  CO2 22 - 32 mmol/L _1 Calcium 8.9 - 10.3 mg/dL 9.0 8.7(L) 8.6(L)  Total Protein 6.5 - 8.1 g/dL 6.5 6.6 6.9  Total Bilirubin 0.3 - 1.2 mg/dL 0.5 0.4 0.6  Alkaline Phos 38 - 126 U/L 66 71 74  AST 15 - 41 U/L _2 ALT 0 - 44 U/L _3 RADIOGRAPHIC STUDIES: I have personally reviewed the radiological images as listed and agreed with the findings in the report. No results found.    Orders Placed This Encounter  Procedures   Iron and Iron Binding Capacity ([facility] only)   All questions were answered. The patient knows to call the clinic with any problems, questions or concerns. No barriers to learning was detected. The total time spent in the appointment was 30 minutes.     Truitt Merle, MD 01/31/2021   I, Wilburn Mylar, am acting as scribe for Truitt Merle, MD.   I have reviewed the above documentation for accuracy and completeness, and I agree with the above.

## 2021-01-31 NOTE — Patient Instructions (Signed)
[facility] ONCOLOGY  Discharge Instructions: Thank you for choosing [facility] to provide your oncology and hematology care.   If you have a lab appointment with the [facility], please go directly to the [facility] and check in at the registration area.   Wear comfortable clothing and clothing appropriate for easy access to any Portacath or PICC line.   We strive to give you quality time with your provider. You may need to reschedule your appointment if you arrive late (15 or more minutes).  Arriving late affects you and other patients whose appointments are after yours.  Also, if you miss three or more appointments without notifying the office, you may be dismissed from the clinic at the providers discretion.      For prescription refill requests, have your pharmacy contact our office and allow 72 hours for refills to be completed.    Today you received the following chemotherapy and/or immunotherapy agents: Docetaxel, Oxaliplatin, Leucovorin, & Fluorouracil   To help prevent nausea and vomiting after your treatment, we encourage you to take your nausea medication as directed.  BELOW ARE SYMPTOMS THAT SHOULD BE REPORTED IMMEDIATELY: *FEVER GREATER THAN 100.4 F (38 C) OR HIGHER *CHILLS OR SWEATING *NAUSEA AND VOMITING THAT IS NOT CONTROLLED WITH YOUR NAUSEA MEDICATION *UNUSUAL SHORTNESS OF BREATH *UNUSUAL BRUISING OR BLEEDING *URINARY PROBLEMS (pain or burning when urinating, or frequent urination) *BOWEL PROBLEMS (unusual diarrhea, constipation, pain near the anus) TENDERNESS IN MOUTH AND THROAT WITH OR WITHOUT PRESENCE OF ULCERS (sore throat, sores in mouth, or a toothache) UNUSUAL RASH, SWELLING OR PAIN  UNUSUAL VAGINAL DISCHARGE OR ITCHING   Items with * indicate a potential emergency and should be followed up as soon as possible or go to the Emergency Department if any problems should occur.  Please show the CHEMOTHERAPY ALERT CARD or  IMMUNOTHERAPY ALERT CARD at check-in to the Emergency Department and triage nurse.  Should you have questions after your visit or need to cancel or reschedule your appointment, please contact [facility]  Dept: [phone]  and follow the prompts.  Office hours are 8:00 a.m. to 4:30 p.m. Monday - Friday. Please note that voicemails left after 4:00 p.m. may not be returned until the following business day.  We are closed weekends and major holidays. You have access to a nurse at all times for urgent questions. Please call the main number to the clinic Dept: [phone] and follow the prompts.   For any non-urgent questions, you may also contact your provider using MyChart. We now offer e-Visits for anyone 21 and older to request care online for non-urgent symptoms. For details visit [email or web address].   Also download the MyChart app! Go to the app store, search "MyChart", open the app, select [facility], and log in with your MyChart username and password.  Due to Covid, a mask is required upon entering the hospital/clinic. If you do not have a mask, one will be given to you upon arrival. For doctor visits, patients may have 1 support person aged 6 or older with them. For treatment visits, patients cannot have anyone with them due to current Covid guidelines and our immunocompromised population.

## 2021-01-31 NOTE — Progress Notes (Signed)
Nutrition follow-up completed with patient during infusion for gastric cancer.   Patient is in good spirits. Weight is stable and documented as 205.2 pounds December 20.   Noted labs: Glucose 112. Patient reports his only complaint is taste alterations. States he tries to exercise and eat healthy foods.  His wife is supportive.  Nutrition diagnosis:  nadequate oral intake resolved.  Food and nutrition related knowledge deficit related to gastric cancer and associated treatments as evidenced by no prior need for nutrition related information.  Intervention: Educated patient on strategies for improving taste alterations.  Provided nutrition fact sheet. Encourage patient to continue strategies for healthy food intake and activity. Encourage patient to strive for weight maintenance. Provided nutrition contact information.  Monitoring, evaluation, goals: Patient will tolerate adequate calories and protein for minimal weight loss.  No follow-up is scheduled.  Patient encouraged to call RD with any questions or concerns.  **Disclaimer: This note was dictated with voice recognition software. Similar sounding words can inadvertently be transcribed and this note may contain transcription errors which may not have been corrected upon publication of note.**

## 2021-02-01 ENCOUNTER — Inpatient Hospital Stay: Payer: Medicare HMO

## 2021-02-01 ENCOUNTER — Other Ambulatory Visit: Payer: Self-pay

## 2021-02-01 VITALS — BP 119/97 | HR 60 | Temp 97.9°F | Resp 18

## 2021-02-01 DIAGNOSIS — Z95828 Presence of other vascular implants and grafts: Secondary | ICD-10-CM

## 2021-02-01 DIAGNOSIS — Z5189 Encounter for other specified aftercare: Secondary | ICD-10-CM | POA: Diagnosis not present

## 2021-02-01 DIAGNOSIS — D508 Other iron deficiency anemias: Secondary | ICD-10-CM | POA: Diagnosis not present

## 2021-02-01 DIAGNOSIS — Z5111 Encounter for antineoplastic chemotherapy: Secondary | ICD-10-CM | POA: Diagnosis not present

## 2021-02-01 DIAGNOSIS — C160 Malignant neoplasm of cardia: Secondary | ICD-10-CM | POA: Diagnosis not present

## 2021-02-01 DIAGNOSIS — C163 Malignant neoplasm of pyloric antrum: Secondary | ICD-10-CM

## 2021-02-01 MED ORDER — PEGFILGRASTIM 6 MG/0.6ML ~~LOC~~ PSKT
6.0000 mg | PREFILLED_SYRINGE | Freq: Once | SUBCUTANEOUS | Status: AC
  Administered 2021-02-01: 16:00:00 6 mg via SUBCUTANEOUS
  Filled 2021-02-01: qty 0.6

## 2021-02-01 MED ORDER — SODIUM CHLORIDE 0.9% FLUSH
10.0000 mL | INTRAVENOUS | Status: DC | PRN
  Administered 2021-02-01: 16:00:00 10 mL via INTRAVENOUS

## 2021-02-01 MED ORDER — HEPARIN SOD (PORK) LOCK FLUSH 100 UNIT/ML IV SOLN
500.0000 [IU] | Freq: Once | INTRAVENOUS | Status: AC
  Administered 2021-02-01: 16:00:00 500 [IU] via INTRAVENOUS

## 2021-02-01 NOTE — Patient Instructions (Signed)

## 2021-02-14 MED FILL — Dexamethasone Sodium Phosphate Inj 100 MG/10ML: INTRAMUSCULAR | Qty: 1 | Status: AC

## 2021-02-15 ENCOUNTER — Other Ambulatory Visit: Payer: Self-pay

## 2021-02-15 ENCOUNTER — Encounter: Payer: Self-pay | Admitting: Hematology

## 2021-02-15 ENCOUNTER — Inpatient Hospital Stay: Payer: Medicare HMO | Admitting: Hematology

## 2021-02-15 ENCOUNTER — Inpatient Hospital Stay: Payer: Medicare HMO | Attending: Hematology

## 2021-02-15 ENCOUNTER — Inpatient Hospital Stay: Payer: Medicare HMO

## 2021-02-15 VITALS — BP 104/74 | HR 74 | Temp 98.0°F | Resp 18 | Wt 201.7 lb

## 2021-02-15 DIAGNOSIS — Z95828 Presence of other vascular implants and grafts: Secondary | ICD-10-CM

## 2021-02-15 DIAGNOSIS — Z79899 Other long term (current) drug therapy: Secondary | ICD-10-CM | POA: Diagnosis not present

## 2021-02-15 DIAGNOSIS — C169 Malignant neoplasm of stomach, unspecified: Secondary | ICD-10-CM | POA: Diagnosis not present

## 2021-02-15 DIAGNOSIS — D509 Iron deficiency anemia, unspecified: Secondary | ICD-10-CM | POA: Insufficient documentation

## 2021-02-15 DIAGNOSIS — Z5111 Encounter for antineoplastic chemotherapy: Secondary | ICD-10-CM | POA: Diagnosis not present

## 2021-02-15 DIAGNOSIS — Z5189 Encounter for other specified aftercare: Secondary | ICD-10-CM | POA: Insufficient documentation

## 2021-02-15 DIAGNOSIS — C163 Malignant neoplasm of pyloric antrum: Secondary | ICD-10-CM | POA: Diagnosis not present

## 2021-02-15 DIAGNOSIS — R197 Diarrhea, unspecified: Secondary | ICD-10-CM | POA: Insufficient documentation

## 2021-02-15 LAB — CBC WITH DIFFERENTIAL/PLATELET
Abs Immature Granulocytes: 0.02 10*3/uL (ref 0.00–0.07)
Basophils Absolute: 0 10*3/uL (ref 0.0–0.1)
Basophils Relative: 1 %
Eosinophils Absolute: 0 10*3/uL (ref 0.0–0.5)
Eosinophils Relative: 0 %
HCT: 33.9 % — ABNORMAL LOW (ref 39.0–52.0)
Hemoglobin: 11.1 g/dL — ABNORMAL LOW (ref 13.0–17.0)
Immature Granulocytes: 1 %
Lymphocytes Relative: 17 %
Lymphs Abs: 0.7 10*3/uL (ref 0.7–4.0)
MCH: 27.5 pg (ref 26.0–34.0)
MCHC: 32.7 g/dL (ref 30.0–36.0)
MCV: 83.9 fL (ref 80.0–100.0)
Monocytes Absolute: 0.7 10*3/uL (ref 0.1–1.0)
Monocytes Relative: 18 %
Neutro Abs: 2.5 10*3/uL (ref 1.7–7.7)
Neutrophils Relative %: 63 %
Platelets: 164 10*3/uL (ref 150–400)
RBC: 4.04 MIL/uL — ABNORMAL LOW (ref 4.22–5.81)
RDW: 28 % — ABNORMAL HIGH (ref 11.5–15.5)
WBC: 3.9 10*3/uL — ABNORMAL LOW (ref 4.0–10.5)
nRBC: 0 % (ref 0.0–0.2)

## 2021-02-15 LAB — COMPREHENSIVE METABOLIC PANEL
ALT: 21 U/L (ref 0–44)
AST: 28 U/L (ref 15–41)
Albumin: 3.8 g/dL (ref 3.5–5.0)
Alkaline Phosphatase: 41 U/L (ref 38–126)
Anion gap: 9 (ref 5–15)
BUN: 17 mg/dL (ref 8–23)
CO2: 25 mmol/L (ref 22–32)
Calcium: 8.7 mg/dL — ABNORMAL LOW (ref 8.9–10.3)
Chloride: 106 mmol/L (ref 98–111)
Creatinine, Ser: 1.38 mg/dL — ABNORMAL HIGH (ref 0.61–1.24)
GFR, Estimated: 54 mL/min — ABNORMAL LOW (ref >60)
Glucose, Bld: 136 mg/dL — ABNORMAL HIGH (ref 70–99)
Potassium: 3.7 mmol/L (ref 3.5–5.1)
Sodium: 140 mmol/L (ref 135–145)
Total Bilirubin: 0.7 mg/dL (ref 0.3–1.2)
Total Protein: 6.6 g/dL (ref 6.5–8.1)

## 2021-02-15 MED ORDER — PALONOSETRON HCL INJECTION 0.25 MG/5ML
0.2500 mg | Freq: Once | INTRAVENOUS | Status: AC
  Administered 2021-02-15: 0.25 mg via INTRAVENOUS
  Filled 2021-02-15: qty 5

## 2021-02-15 MED ORDER — SODIUM CHLORIDE 0.9 % IV SOLN
2600.0000 mg/m2 | INTRAVENOUS | Status: DC
  Administered 2021-02-15: 5450 mg via INTRAVENOUS
  Filled 2021-02-15: qty 109

## 2021-02-15 MED ORDER — OXALIPLATIN CHEMO INJECTION 100 MG/20ML
85.0000 mg/m2 | Freq: Once | INTRAVENOUS | Status: AC
  Administered 2021-02-15: 180 mg via INTRAVENOUS
  Filled 2021-02-15: qty 36

## 2021-02-15 MED ORDER — DEXTROSE 5 % IV SOLN: Freq: Once | INTRAVENOUS | Status: AC

## 2021-02-15 MED ORDER — PROCHLORPERAZINE MALEATE 10 MG PO TABS: 10.0000 mg | ORAL_TABLET | Freq: Four times a day (QID) | ORAL | 2 refills | Status: DC | PRN

## 2021-02-15 MED ORDER — SODIUM CHLORIDE 0.9% FLUSH
10.0000 mL | INTRAVENOUS | Status: AC | PRN
  Administered 2021-02-15: 10 mL

## 2021-02-15 MED ORDER — ACETAMINOPHEN 325 MG PO TABS
650.0000 mg | ORAL_TABLET | Freq: Once | ORAL | Status: AC
  Administered 2021-02-15: 650 mg via ORAL
  Filled 2021-02-15: qty 2

## 2021-02-15 MED ORDER — SODIUM CHLORIDE 0.9 % IV SOLN
10.0000 mg | Freq: Once | INTRAVENOUS | Status: AC
  Administered 2021-02-15: 10 mg via INTRAVENOUS
  Filled 2021-02-15: qty 10

## 2021-02-15 MED ORDER — SODIUM CHLORIDE 0.9 % IV SOLN
50.0000 mg/m2 | Freq: Once | INTRAVENOUS | Status: AC
  Administered 2021-02-15: 110 mg via INTRAVENOUS
  Filled 2021-02-15: qty 11

## 2021-02-15 MED ORDER — LEUCOVORIN CALCIUM INJECTION 350 MG
200.0000 mg/m2 | Freq: Once | INTRAVENOUS | Status: AC
  Administered 2021-02-15: 420 mg via INTRAVENOUS
  Filled 2021-02-15: qty 21

## 2021-02-15 NOTE — Progress Notes (Signed)
[place]   [phone] Fax:([phone]) [phone]   Clinic Follow up Note   Patient Care Team: [name], MD as PCP - General (Internal Medicine) [name], [name], MD as PCP - Cardiology (Cardiology) [name], MD as PCP - Electrophysiology (Cardiology) [name], MD as Consulting Physician (General Surgery) Truitt Merle, MD as Consulting Physician (Hematology)  Date of Service:  02/15/2021  CHIEF COMPLAINT: f/u of gastric cancer  CURRENT THERAPY:  Neoadjuvant FLOT4, q2weeks, started 01/02/21  ASSESSMENT & PLAN:  Jon Williams is a 71 y.o. male with   1. Gastric Cancer, cT3N0M0, stage II -initially referred to ED by PCP on 11/30/20 for low hemoglobin of 7.4 on routine lab work. He was also recently treated for H-pylori gastritis. EGD on 12/01/20 by Dr. Benson Norway showed a tumor at the incisura, which pathology confirmed was adenocarcinoma.  -CT CAP 12/01/20 showed no metastatic disease. -He met with Dr. Zenia Resides on 12/05/20. -He underwent echo on 10/26 showing reduced EF and severe LV dysfunction. Per Dr. Lovena Le, surgery is recommended only if it is curative given his moderate risk. -He proceeded to EUS on 12/16/20 under Dr. Benson Norway, staging T3 N0. -IHC returned abnormal, showing loss of expression in MLH1 and PMS2. Which predict good response to immunotherapy  -port placed 12/23/20 -he started FLOT4 with dose reduction on 01/02/21. He has tolerated very well overall. -labs reviewed, stable to improved. He continues to tolerate treatment very well. We will plan for repeat scan before his surgery.   2. H/o Prostate Cancer  -diagnosed with Gleason 4+4 prostate cancer in early 2019. He was treated with long-term ADT in combination with 8 weeks of IMRT.   3. Diabetes, atrial fibrillation, severe cardiomyopathy with EF 20 to 25%, status post ICD placement -Follow-up with PCP and cardiology -We will carefully monitor his heart function during chemotherapy    4. Iron deficient anemia -Secondary to gastric cancer -received 2 doses of IV Venofer, last 12/20/20 -hg stable at 11.1 today (02/15/21)     PLAN:  -proceed with C4 FLOT4 with full dose              -G-CSF on day 2 -I refilled compazine for him today -lab, flush, f/u, and C5 FLOT4 on 02/29/20   No problem-specific Assessment & Plan notes found for this encounter.   SUMMARY OF ONCOLOGIC HISTORY: Oncology History Overview Note  Cancer Staging Gastric cancer [facility]) Staging form: Stomach, AJCC 8th Edition - Clinical stage from 12/01/2020: Stage IIB (cT3, cN0, cM0) - Signed by Truitt Merle, MD on 12/20/2020 Stage prefix: Initial diagnosis Total positive nodes: 0  Malignant neoplasm of prostate ([place]) Staging form: Prostate, AJCC 8th Edition - Clinical: Stage IIC (cT2b, cN0, cM0, PSA: 6.7, Grade Group: 4) - Unsigned Prostate specific antigen (PSA) range: Less than 10 Gleason score: 8 Histologic grading system: 5 grade system    Gastric cancer ([place])  12/01/2020 Procedure   Upper Endoscopy, Dr. Benson Norway  Impression: - Normal esophagus. 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 Biopsied. - Normal examined duodenum.   12/01/2020 Pathology Results   FINAL MICROSCOPIC DIAGNOSIS:   A. INCISURA, BIOPSY:  - Adenocarcinoma, moderate to poorly differentiated arising in a  background of chronic gastritis with intestinal metaplasia.    12/01/2020 Imaging   CT CAP  IMPRESSION: Focal wall thickening along the posterior aspect of the gastric antrum, likely corresponding to the patient's newly diagnosed gastric cancer.   No findings suspicious for metastatic disease.   Mild multifocal  pneumonia, lower lobe predominant, likely on the basis of aspiration. Trace right pleural effusion.   Fiducial markers along the prostate in this patient with known prostate cancer.   12/01/2020 Cancer Staging   Staging form: Stomach, AJCC 8th Edition - Clinical stage from 12/01/2020: Stage IIB (cT3,  cN0, cM0) - Signed by Truitt Merle, MD on 12/20/2020 Stage prefix: Initial diagnosis Total positive nodes: 0    12/08/2020 Initial Diagnosis   Gastric cancer ([place])   12/16/2020 Procedure   EUS  - Wall thickening was seen in the lesser curve of the stomach and in the antrum of the stomach. The thickening appeared to be primarily within the serosa (Layer 5). T3 N0 Mx. - There was no sign of significant pathology in the entire pancreas. - There was no sign of significant pathology in the common bile duct and in the gallbladder. - There was no evidence of significant pathology in the left lobe of the liver. - Endosonographic images of the left adrenal gland were unremarkable. - The celiac trunk and superior mesenteric artery were endosonographically normal. - The mediastinum was unremarkable endosonographically. - No specimens collected.   01/02/2021 -  Chemotherapy   Patient is on Treatment Plan : GASTROESOPHAGEAL FLOT q14d X 4 cycles        INTERVAL HISTORY:  Jon Williams is here for a follow up of gastric cancer. He was last seen by me on 01/31/21. He presents to the clinic accompanied by his wife. He reports some pain in his hands, described as throbbing, and feet. He denies numbness and tingling. He also has some skin darkening and skin cracking on his hands. He also reports nausea and taste change but notes these are manageable. His eyes also began to water during our visit; his wife notes his eyes and nose run often. His wife expressed concern with his bowel movements, but he reports his stool is "compact" and denies difficulty passing stool.   All other systems were reviewed with the patient and are negative.  MEDICAL HISTORY:  Past Medical History:  Diagnosis Date   AICD (automatic cardioverter/defibrillator) present 05/25/2016   biv icd   Bunion, right    Chronic combined systolic and diastolic CHF (congestive heart failure) ([place])    Echo 1/18: Mild conc LVH, EF 15-20,  severe diff HK, inf and inf-septal AK, Gr 3 DD, mild to mod MR, severe LAE, mod reduced RVSF, mod RAE, mild TR, PASP 50   Coronary artery disease involving native coronary artery without angina pectoris 04/17/2016   LHC 1/18: pLCx 58, mLCx 20, mRCA 40, dRCA 20, LVEDP 23, mean RA 8, PA 42/20, PCWP 17   Diabetes mellitus without complication (HCC)    DJD (degenerative joint disease)    History of atrial fibrillation    History of atrial flutter    History of cardiomegaly 06/07/2016   Noted on CXR   History of colon polyps 06/28/2017   Noted on colonoscopy   LBBB (left bundle branch block)    NICM (nonischemic cardiomyopathy) ([place])    Echo 1/18:  Mild conc LVH, EF 15-20, severe diff HK, inf and inf-septal AK, Gr 3 DD, mild to mod MR, severe LAE, mod reduced RVSF, mod RAE, mild TR, PASP 50   OA (osteoarthritis)    knee   Other secondary pulmonary hypertension ([place]) 04/17/2016   Prostate cancer ([place]) 2019   Sigmoid diverticulosis 06/28/2017   Noted on colonoscopy    SURGICAL HISTORY: Past Surgical History:  Procedure Laterality Date  BIOPSY  12/01/2020   Procedure: BIOPSY;  Surgeon: [name], MD;  Location: [facility];  Service: Endoscopy;;   BIV ICD INSERTION CRT-D N/A 05/25/2016   Procedure: BiV ICD Insertion CRT-D;  Surgeon: [name], MD;  Location: [place] [facility];  Service: Cardiovascular;  Laterality: N/A;   CARDIAC CATHETERIZATION N/A 03/02/2016   Procedure: Right/Left Heart Cath and Coronary Angiography;  Surgeon: Nelva Bush, MD;  Location: [place] [facility];  Service: Cardiovascular;  Laterality: N/A;   CARDIOVERSION N/A 07/17/2016   Procedure: Cardioversion;  Surgeon: [name], MD;  Location: [place] [facility];  Service: Cardiovascular;  Laterality: N/A;   COLONOSCOPY WITH PROPOFOL N/A 06/28/2017   Procedure: COLONOSCOPY WITH PROPOFOL;  Surgeon: [name], MD;  Location: [facility];  Service: Endoscopy;  Laterality: N/A;   colonscopy  2009    ESOPHAGOGASTRODUODENOSCOPY N/A 12/01/2020   Procedure: ESOPHAGOGASTRODUODENOSCOPY (EGD);  Surgeon: [name], MD;  Location: [facility];  Service: Endoscopy;  Laterality: N/A;  IDA/guaiac positive stools   ESOPHAGOGASTRODUODENOSCOPY (EGD) WITH PROPOFOL N/A 12/16/2020   Procedure: ESOPHAGOGASTRODUODENOSCOPY (EGD) WITH PROPOFOL;  Surgeon: [name], MD;  [facility]: [facility];  Service: Endoscopy;  Laterality: N/A;   GOLD SEED IMPLANT N/A 12/24/2017   Procedure: GOLD SEED IMPLANT, TRANSERINEAL;  Surgeon: Festus Aloe, MD;  Location: [facility];  Service: Urology;  Laterality: N/A;   INSERT / REPLACE / REMOVE PACEMAKER     LEAD REVISION  10/10/2018   LEAD REVISION/REPAIR N/A 10/10/2018   Procedure: LEAD REVISION/REPAIR;  Surgeon: [name], MD;  Location: [place] [facility];  Service: Cardiovascular;  Laterality: N/A;   POLYPECTOMY  06/28/2017   Procedure: POLYPECTOMY;  Surgeon: [name], MD;  [facility];  Service: Endoscopy;;  ascending and descending colon polyp   PORTACATH PLACEMENT Right 12/23/2020   Procedure: INSERTION PORT-A-CATH;  Surgeon: [name], MD;  Location: [facility];  Service: General;  Laterality: Right;   PROSTATE BIOPSY  02/20/2017   SPACE OAR INSTILLATION N/A 12/24/2017   Procedure: SPACE OAR INSTILLATION;  Surgeon: Festus Aloe, MD;  [facility];  Service: Urology;  Laterality: N/A;   TOTAL KNEE ARTHROPLASTY Right 08/16/2017   Procedure: RIGHT TOTAL KNEE ARTHROPLASTY;  Surgeon: [name], MD;  Location: [facility];  Service: Orthopedics;  Laterality: Right;   UPPER ESOPHAGEAL ENDOSCOPIC ULTRASOUND (EUS) N/A 12/16/2020   Procedure: UPPER ESOPHAGEAL ENDOSCOPIC ULTRASOUND (EUS);  Surgeon: [name], MD;  Location: [facility];  Service: Endoscopy;  Laterality: N/A;    I have reviewed the social history and family history with the patient and they are unchanged from previous note.  ALLERGIES:  is allergic to aspirin, sulfa  antibiotics, and other.  MEDICATIONS:  Current Outpatient Medications  Medication Sig Dispense Refill   atorvastatin (LIPITOR) 40 MG tablet TAKE 1 TABLET(40 MG) BY MOUTH DAILY 90 tablet 0   carvedilol (COREG) 3.125 MG tablet TAKE 1 TABLET(3.125 MG) BY MOUTH TWICE DAILY 180 tablet 0   cholecalciferol (VITAMIN D3) 25 MCG (1000 UT) tablet Take 1,000 Units by mouth daily with lunch.      dexamethasone (DECADRON) 4 MG tablet Take 2 tablets (8 mg total) by mouth daily. Take 1 tab daily starting the day after chemotherapy for 2 days. Take with food. 30 tablet 1   docusate sodium (COLACE) 100 MG capsule Take 1 capsule (100 mg total) by mouth 2 (two) times daily as needed for mild constipation. 20 capsule 0   furosemide (LASIX) 40 MG tablet Take 1 tablet (  40 mg total) by mouth 2 (two) times daily. 180 tablet 3   lidocaine-prilocaine (EMLA) cream Apply to affected area once 30 g 3   metFORMIN (GLUCOPHAGE-XR) 500 MG 24 hr tablet Take 500 mg by mouth every evening.     Multiple Vitamin (MULTIVITAMIN WITH MINERALS) TABS tablet Take 1 tablet by mouth daily with lunch. One-A-Day     Naphazoline HCl (CLEAR EYES OP) Place 1 drop into both eyes daily as needed (irritation).     ondansetron (ZOFRAN) 4 MG tablet Take 1 tablet (4 mg total) by mouth every 6 (six) hours as needed for nausea. 20 tablet 0   ondansetron (ZOFRAN) 8 MG tablet Take 1 tablet (8 mg total) by mouth 2 (two) times daily as needed for refractory nausea / vomiting. Start on day 3 after chemotherapy. 30 tablet 1   pantoprazole (PROTONIX) 40 MG tablet Take 1 tablet (40 mg total) by mouth 2 (two) times daily. 60 tablet 0   polyethylene glycol (MIRALAX / GLYCOLAX) 17 g packet Take 17 g by mouth daily as needed for moderate constipation. 14 each 0   potassium chloride (KLOR-CON) 10 MEQ tablet TAKE 1 TABLET(10 MEQ) BY MOUTH TWICE DAILY 180 tablet 3   prochlorperazine (COMPAZINE) 10 MG tablet Take 1 tablet (10 mg total) by mouth every 6 (six) hours as  needed (Nausea or vomiting). 30 tablet 2   sacubitril-valsartan (ENTRESTO) 24-26 MG Take 1 tablet by mouth 2 (two) times daily. 180 tablet 3   sotalol (BETAPACE) 80 MG tablet Take 120 mg by mouth in the morning and at bedtime.     No current facility-administered medications for this visit.   Facility-Administered Medications Ordered in Other Visits  Medication Dose Route Frequency Provider Last Rate Last Admin   DOCEtaxel (TAXOTERE) 110 mg in sodium chloride 0.9 % 250 mL chemo infusion  50 mg/m2 (Treatment Plan Recorded) Intravenous Once Truitt Merle, MD       fluorouracil (ADRUCIL) 5,450 mg in sodium chloride 0.9 % 141 mL chemo infusion  2,600 mg/m2 (Treatment Plan Recorded) Intravenous 1 day or 1 dose Truitt Merle, MD       leucovorin 420 mg in dextrose 5 % 250 mL infusion  200 mg/m2 (Treatment Plan Recorded) Intravenous Once Truitt Merle, MD       oxaliplatin (ELOXATIN) 180 mg in dextrose 5 % 500 mL chemo infusion  85 mg/m2 (Treatment Plan Recorded) Intravenous Once Truitt Merle, MD        PHYSICAL EXAMINATION: ECOG PERFORMANCE STATUS: 1 - Symptomatic but completely ambulatory  Vitals:   02/15/21 0827  BP: 104/74  Pulse: 74  Resp: 18  Temp: 98 F (36.7 C)  SpO2: 98%   Wt Readings from Last 3 Encounters:  02/15/21 201 lb 11.2 oz (91.5 kg)  01/31/21 205 lb 3.2 oz (93.1 kg)  01/17/21 206 lb 7 oz (93.6 kg)     GENERAL:alert, no distress and comfortable SKIN: skin color normal, no rashes or significant lesions EYES: normal, Conjunctiva are pink and non-injected, sclera clear  NEURO: alert & oriented x 3 with fluent speech  LABORATORY DATA:  I have reviewed the data as listed CBC Latest Ref Rng & Units 02/15/2021 01/31/2021 01/17/2021  WBC 4.0 - 10.5 K/uL 3.9(L) 16.2(H) 9.2  Hemoglobin 13.0 - 17.0 g/dL 11.1(L) 11.2(L) 11.1(L)  Hematocrit 39.0 - 52.0 % 33.9(L) 35.8(L) 34.0(L)  Platelets 150 - 400 K/uL 164 141(L) 163     CMP Latest Ref Rng & Units 02/15/2021 01/31/2021 01/17/2021  Glucose  70 - 99 mg/dL 136(H) 112(H) 94  BUN 8 - 23 mg/dL 17 21 18   Creatinine 0.61 - 1.24 mg/dL 1.38(H) 1.18 1.21  Sodium 135 - 145 mmol/L 140 139 139  Potassium 3.5 - 5.1 mmol/L 3.7 3.8 3.9  Chloride 98 - 111 mmol/L 106 106 107  CO2 22 - 32 mmol/L 25 24 22   Calcium 8.9 - 10.3 mg/dL 8.7(L) 9.0 8.7(L)  Total Protein 6.5 - 8.1 g/dL 6.6 6.5 6.6  Total Bilirubin 0.3 - 1.2 mg/dL 0.7 0.5 0.4  Alkaline Phos 38 - 126 U/L 41 66 71  AST 15 - 41 U/L 28 19 21   ALT 0 - 44 U/L 21 20 25       RADIOGRAPHIC STUDIES: I have personally reviewed the radiological images as listed and agreed with the findings in the report. No results found.    No orders of the defined types were placed in this encounter.  All questions were answered. The patient knows to call the clinic with any problems, questions or concerns. No barriers to learning was detected. The total time spent in the appointment was 30 minutes.     Truitt Merle, MD 02/15/2021   I, Wilburn Mylar, am acting as scribe for Truitt Merle, MD.   I have reviewed the above documentation for accuracy and completeness, and I agree with the above.

## 2021-02-15 NOTE — Patient Instructions (Signed)
[facility] ONCOLOGY  Discharge Instructions: Thank you for choosing [facility] to provide your oncology and hematology care.   If you have a lab appointment with the [facility], please go directly to the [facility] and check in at the registration area.   Wear comfortable clothing and clothing appropriate for easy access to any Portacath or PICC line.   We strive to give you quality time with your provider. You may need to reschedule your appointment if you arrive late (15 or more minutes).  Arriving late affects you and other patients whose appointments are after yours.  Also, if you miss three or more appointments without notifying the office, you may be dismissed from the clinic at the providers discretion.      For prescription refill requests, have your pharmacy contact our office and allow 72 hours for refills to be completed.    Today you received the following chemotherapy and/or immunotherapy agents: Taxotere/Oxaliplatin/Leucovorin/5FU.      To help prevent nausea and vomiting after your treatment, we encourage you to take your nausea medication as directed.  BELOW ARE SYMPTOMS THAT SHOULD BE REPORTED IMMEDIATELY: *FEVER GREATER THAN 100.4 F (38 C) OR HIGHER *CHILLS OR SWEATING *NAUSEA AND VOMITING THAT IS NOT CONTROLLED WITH YOUR NAUSEA MEDICATION *UNUSUAL SHORTNESS OF BREATH *UNUSUAL BRUISING OR BLEEDING *URINARY PROBLEMS (pain or burning when urinating, or frequent urination) *BOWEL PROBLEMS (unusual diarrhea, constipation, pain near the anus) TENDERNESS IN MOUTH AND THROAT WITH OR WITHOUT PRESENCE OF ULCERS (sore throat, sores in mouth, or a toothache) UNUSUAL RASH, SWELLING OR PAIN  UNUSUAL VAGINAL DISCHARGE OR ITCHING   Items with * indicate a potential emergency and should be followed up as soon as possible or go to the Emergency Department if any problems should occur.  Please show the CHEMOTHERAPY ALERT CARD or IMMUNOTHERAPY  ALERT CARD at check-in to the Emergency Department and triage nurse.  Should you have questions after your visit or need to cancel or reschedule your appointment, please contact [facility]  Dept: [phone]  and follow the prompts.  Office hours are 8:00 a.m. to 4:30 p.m. Monday - Friday. Please note that voicemails left after 4:00 p.m. may not be returned until the following business day.  We are closed weekends and major holidays. You have access to a nurse at all times for urgent questions. Please call the main number to the clinic Dept: [phone] and follow the prompts.   For any non-urgent questions, you may also contact your provider using MyChart. We now offer e-Visits for anyone 97 and older to request care online for non-urgent symptoms. For details visit [email or web address].   Also download the MyChart app! Go to the app store, search "MyChart", open the app, select [facility], and log in with your MyChart username and password.  Due to Covid, a mask is required upon entering the hospital/clinic. If you do not have a mask, one will be given to you upon arrival. For doctor visits, patients may have 1 support person aged 54 or older with them. For treatment visits, patients cannot have anyone with them due to current Covid guidelines and our immunocompromised population.

## 2021-02-16 ENCOUNTER — Inpatient Hospital Stay: Payer: Medicare HMO

## 2021-02-16 VITALS — BP 139/69 | HR 70 | Temp 98.7°F | Resp 18

## 2021-02-16 DIAGNOSIS — Z79899 Other long term (current) drug therapy: Secondary | ICD-10-CM | POA: Diagnosis not present

## 2021-02-16 DIAGNOSIS — Z95828 Presence of other vascular implants and grafts: Secondary | ICD-10-CM

## 2021-02-16 DIAGNOSIS — R197 Diarrhea, unspecified: Secondary | ICD-10-CM | POA: Diagnosis not present

## 2021-02-16 DIAGNOSIS — D509 Iron deficiency anemia, unspecified: Secondary | ICD-10-CM | POA: Diagnosis not present

## 2021-02-16 DIAGNOSIS — C169 Malignant neoplasm of stomach, unspecified: Secondary | ICD-10-CM | POA: Diagnosis not present

## 2021-02-16 DIAGNOSIS — Z5189 Encounter for other specified aftercare: Secondary | ICD-10-CM | POA: Diagnosis not present

## 2021-02-16 DIAGNOSIS — C163 Malignant neoplasm of pyloric antrum: Secondary | ICD-10-CM

## 2021-02-16 DIAGNOSIS — Z5111 Encounter for antineoplastic chemotherapy: Secondary | ICD-10-CM | POA: Diagnosis not present

## 2021-02-16 MED ORDER — SODIUM CHLORIDE 0.9% FLUSH
10.0000 mL | INTRAVENOUS | Status: DC | PRN
  Administered 2021-02-16: 10 mL via INTRAVENOUS

## 2021-02-16 MED ORDER — PEGFILGRASTIM 6 MG/0.6ML ~~LOC~~ PSKT
6.0000 mg | PREFILLED_SYRINGE | Freq: Once | SUBCUTANEOUS | Status: AC
  Administered 2021-02-16: 6 mg via SUBCUTANEOUS
  Filled 2021-02-16: qty 0.6

## 2021-02-16 MED ORDER — HEPARIN SOD (PORK) LOCK FLUSH 100 UNIT/ML IV SOLN
500.0000 [IU] | Freq: Once | INTRAVENOUS | Status: AC
  Administered 2021-02-16: 500 [IU] via INTRAVENOUS

## 2021-02-16 NOTE — Patient Instructions (Signed)

## 2021-02-17 ENCOUNTER — Telehealth: Payer: Self-pay | Admitting: Hematology

## 2021-02-17 NOTE — Telephone Encounter (Signed)
Left message with follow-up appointments per 1/4 los. °

## 2021-02-24 ENCOUNTER — Encounter: Payer: Self-pay | Admitting: Podiatry

## 2021-02-24 ENCOUNTER — Ambulatory Visit: Payer: Medicare HMO | Admitting: Podiatry

## 2021-02-24 ENCOUNTER — Other Ambulatory Visit: Payer: Self-pay

## 2021-02-24 ENCOUNTER — Ambulatory Visit (INDEPENDENT_AMBULATORY_CARE_PROVIDER_SITE_OTHER): Payer: Medicare HMO

## 2021-02-24 DIAGNOSIS — I739 Peripheral vascular disease, unspecified: Secondary | ICD-10-CM | POA: Diagnosis not present

## 2021-02-24 DIAGNOSIS — M21619 Bunion of unspecified foot: Secondary | ICD-10-CM | POA: Diagnosis not present

## 2021-02-24 DIAGNOSIS — M779 Enthesopathy, unspecified: Secondary | ICD-10-CM

## 2021-02-24 MED ORDER — DOXYCYCLINE HYCLATE 100 MG PO TABS: 100.0000 mg | ORAL_TABLET | Freq: Two times a day (BID) | ORAL | 1 refills | Status: DC

## 2021-02-24 NOTE — Progress Notes (Signed)
Subjective:   Patient ID: Jon Williams, male   DOB: 71 y.o.   MRN: [id number]   HPI Patient presents with caregiver stating that he wore a very tight shoe to brace his right first metatarsal knowing he has structural deformity and it has turned slightly red with some irritation of the tissue   ROS      Objective:  Physical Exam  Does have diminishment of vascular status that they are aware of and keep an eye on and I educated them but I do not hear significant claudication symptoms with an abrasion of the right first metatarsal head with no subcutaneous exposure but in a braised area measuring around 1 cm x 1 cm on the bunion site with no proximal edema erythema or drainage noted chronic     Assessment:  Irritated area right first metatarsal is improving with changes in shoe gear and probably occurred from poor shoe gear choices      Plan:  H&P and precautionary x-ray taken and advised on the continuation of this type of shoe gear as precautionary measure I placed on doxycycline for 10 days gave strict instructions if any further changes should occur to be seen back immediately and I did discuss the vascular issues do not recommend treatment for this currently as there is no symptoms but if this were to get worse or other issues were to occur it needs to be checked  X-rays indicate significant structural bunion deformity with no osteolysis or soft tissue bubbling

## 2021-02-27 MED FILL — Dexamethasone Sodium Phosphate Inj 100 MG/10ML: INTRAMUSCULAR | Qty: 1 | Status: AC

## 2021-02-28 ENCOUNTER — Ambulatory Visit: Payer: Medicare HMO

## 2021-02-28 ENCOUNTER — Encounter: Payer: Self-pay | Admitting: Nurse Practitioner

## 2021-02-28 ENCOUNTER — Inpatient Hospital Stay: Payer: Medicare HMO | Admitting: Nurse Practitioner

## 2021-02-28 ENCOUNTER — Other Ambulatory Visit: Payer: Self-pay

## 2021-02-28 ENCOUNTER — Other Ambulatory Visit: Payer: Medicare HMO

## 2021-02-28 ENCOUNTER — Inpatient Hospital Stay: Payer: Medicare HMO

## 2021-02-28 VITALS — BP 128/85 | HR 62 | Temp 96.5°F | Resp 20 | Ht 66.0 in | Wt 194.0 lb

## 2021-02-28 DIAGNOSIS — Z5189 Encounter for other specified aftercare: Secondary | ICD-10-CM | POA: Diagnosis not present

## 2021-02-28 DIAGNOSIS — C163 Malignant neoplasm of pyloric antrum: Secondary | ICD-10-CM | POA: Diagnosis not present

## 2021-02-28 DIAGNOSIS — Z95828 Presence of other vascular implants and grafts: Secondary | ICD-10-CM

## 2021-02-28 DIAGNOSIS — Z79899 Other long term (current) drug therapy: Secondary | ICD-10-CM | POA: Diagnosis not present

## 2021-02-28 DIAGNOSIS — C169 Malignant neoplasm of stomach, unspecified: Secondary | ICD-10-CM | POA: Diagnosis not present

## 2021-02-28 DIAGNOSIS — R197 Diarrhea, unspecified: Secondary | ICD-10-CM

## 2021-02-28 DIAGNOSIS — Z5111 Encounter for antineoplastic chemotherapy: Secondary | ICD-10-CM | POA: Diagnosis not present

## 2021-02-28 DIAGNOSIS — D509 Iron deficiency anemia, unspecified: Secondary | ICD-10-CM | POA: Diagnosis not present

## 2021-02-28 LAB — CBC WITH DIFFERENTIAL/PLATELET
Abs Immature Granulocytes: 0.11 10*3/uL — ABNORMAL HIGH (ref 0.00–0.07)
Basophils Absolute: 0 10*3/uL (ref 0.0–0.1)
Basophils Relative: 0 %
Eosinophils Absolute: 0 10*3/uL (ref 0.0–0.5)
Eosinophils Relative: 0 %
HCT: 38 % — ABNORMAL LOW (ref 39.0–52.0)
Hemoglobin: 12.4 g/dL — ABNORMAL LOW (ref 13.0–17.0)
Immature Granulocytes: 1 %
Lymphocytes Relative: 8 %
Lymphs Abs: 1 10*3/uL (ref 0.7–4.0)
MCH: 28 pg (ref 26.0–34.0)
MCHC: 32.6 g/dL (ref 30.0–36.0)
MCV: 85.8 fL (ref 80.0–100.0)
Monocytes Absolute: 0.9 10*3/uL (ref 0.1–1.0)
Monocytes Relative: 7 %
Neutro Abs: 10.7 10*3/uL — ABNORMAL HIGH (ref 1.7–7.7)
Neutrophils Relative %: 84 %
Platelets: 104 10*3/uL — ABNORMAL LOW (ref 150–400)
RBC: 4.43 MIL/uL (ref 4.22–5.81)
RDW: 27.5 % — ABNORMAL HIGH (ref 11.5–15.5)
WBC: 12.7 10*3/uL — ABNORMAL HIGH (ref 4.0–10.5)
nRBC: 0.3 % — ABNORMAL HIGH (ref 0.0–0.2)

## 2021-02-28 LAB — COMPREHENSIVE METABOLIC PANEL
ALT: 26 U/L (ref 0–44)
AST: 29 U/L (ref 15–41)
Albumin: 4 g/dL (ref 3.5–5.0)
Alkaline Phosphatase: 61 U/L (ref 38–126)
Anion gap: 12 (ref 5–15)
BUN: 21 mg/dL (ref 8–23)
CO2: 22 mmol/L (ref 22–32)
Calcium: 9.1 mg/dL (ref 8.9–10.3)
Chloride: 104 mmol/L (ref 98–111)
Creatinine, Ser: 1.46 mg/dL — ABNORMAL HIGH (ref 0.61–1.24)
GFR, Estimated: 51 mL/min — ABNORMAL LOW (ref >60)
Glucose, Bld: 120 mg/dL — ABNORMAL HIGH (ref 70–99)
Potassium: 3.5 mmol/L (ref 3.5–5.1)
Sodium: 138 mmol/L (ref 135–145)
Total Bilirubin: 0.8 mg/dL (ref 0.3–1.2)
Total Protein: 6.8 g/dL (ref 6.5–8.1)

## 2021-02-28 LAB — CEA (IN HOUSE-CHCC): CEA (CHCC-In House): 11.21 ng/mL — ABNORMAL HIGH (ref 0.00–5.00)

## 2021-02-28 LAB — FERRITIN: Ferritin: 594 ng/mL — ABNORMAL HIGH (ref 24–336)

## 2021-02-28 MED ORDER — SODIUM CHLORIDE 0.9 % IV SOLN: Freq: Once | INTRAVENOUS | Status: AC

## 2021-02-28 MED ORDER — SODIUM CHLORIDE 0.9% FLUSH
10.0000 mL | Freq: Once | INTRAVENOUS | Status: AC
  Administered 2021-02-28: 10 mL via INTRAVENOUS

## 2021-02-28 MED ORDER — HEPARIN SOD (PORK) LOCK FLUSH 100 UNIT/ML IV SOLN
500.0000 [IU] | Freq: Once | INTRAVENOUS | Status: AC
  Administered 2021-02-28: 500 [IU] via INTRAVENOUS

## 2021-02-28 NOTE — Patient Instructions (Signed)
Rehydration, Adult °Rehydration is the replacement of body fluids, salts, and minerals (electrolytes) that are lost during dehydration. Dehydration is when there is not enough water or other fluids in the body. This happens when you lose more fluids than you take in. Common causes of dehydration include: °Not drinking enough fluids. This can occur when you are ill or doing activities that require a lot of energy, especially in hot weather. °Conditions that cause loss of water or other fluids, such as diarrhea, vomiting, sweating, or urinating a lot. °Other illnesses, such as fever or infection. °Certain medicines, such as those that remove excess fluid from the body (diuretics). °Symptoms of mild or moderate dehydration may include thirst, dry lips and mouth, and dizziness. Symptoms of severe dehydration may include increased heart rate, confusion, fainting, and not urinating. °For severe dehydration, you may need to get fluids through an IV at the hospital. For mild or moderate dehydration, you can usually rehydrate at home by drinking certain fluids as told by your health care provider. °What are the risks? °Generally, rehydration is safe. However, taking in too much fluid (overhydration) can be a problem. This is rare. Overhydration can cause an electrolyte imbalance, kidney failure, or a decrease in salt (sodium) levels in the body. °Supplies needed °You will need an oral rehydration solution (ORS) if your health care provider tells you to use one. This is a drink to treat dehydration. It can be found in pharmacies and retail stores. °How to rehydrate °Fluids °Follow instructions from your health care provider for rehydration. The kind of fluid and the amount you should drink depend on your condition. In general, you should choose drinks that you prefer. °If told by your health care provider, drink an ORS. °Make an ORS by following instructions on the package. °Start by drinking small amounts, about ½ cup (120  mL) every 5-10 minutes. °Slowly increase how much you drink until you have taken the amount recommended by your health care provider. °Drink enough clear fluids to keep your urine pale yellow. If you were told to drink an ORS, finish it first, then start slowly drinking other clear fluids. Drink fluids such as: °Water. This includes sparkling water and flavored water. Drinking only water can lead to having too little sodium in your body (hyponatremia). Follow the advice of your health care provider. °Water from ice chips you suck on. °Fruit juice with water you add to it (diluted). °Sports drinks. °Hot or cold herbal teas. °Broth-based soups. °Milk or milk products. °Food °Follow instructions from your health care provider about what to eat while you rehydrate. Your health care provider may recommend that you slowly begin eating regular foods in small amounts. °Eat foods that contain a healthy balance of electrolytes, such as bananas, oranges, potatoes, tomatoes, and spinach. °Avoid foods that are greasy or contain a lot of sugar. °In some cases, you may get nutrition through a feeding tube that is passed through your nose and into your stomach (nasogastric tube, or NG tube). This may be done if you have uncontrolled vomiting or diarrhea. °Beverages to avoid °Certain beverages may make dehydration worse. While you rehydrate, avoid drinking alcohol. °How to tell if you are recovering from dehydration °You may be recovering from dehydration if: °You are urinating more often than before you started rehydrating. °Your urine is pale yellow. °Your energy level improves. °You vomit less frequently. °You have diarrhea less frequently. °Your appetite improves or returns to normal. °You feel less dizzy or less light-headed. °Your   skin tone and color start to look more normal. Follow these instructions at home: Take over-the-counter and prescription medicines only as told by your health care provider. Do not take sodium  tablets. Doing this can lead to having too much sodium in your body (hypernatremia). Contact a health care provider if: You continue to have symptoms of mild or moderate dehydration, such as: Thirst. Dry lips. Slightly dry mouth. Dizziness. Dark urine or less urine than normal. Muscle cramps. You continue to vomit or have diarrhea. Get help right away if you: Have symptoms of dehydration that get worse. Have a fever. Have a severe headache. Have been vomiting and the following happens: Your vomiting gets worse or does not go away. Your vomit includes blood or green matter (bile). You cannot eat or drink without vomiting. Have problems with urination or bowel movements, such as: Diarrhea that gets worse or does not go away. Blood in your stool (feces). This may cause stool to look black and tarry. Not urinating, or urinating only a small amount of very dark urine, within 6-8 hours. Have trouble breathing. Have symptoms that get worse with treatment. These symptoms may represent a serious problem that is an emergency. Do not wait to see if the symptoms will go away. Get medical help right away. Call your local emergency services (911 in the [place] Do not drive yourself to the hospital. Summary Rehydration is the replacement of body fluids and minerals (electrolytes) that are lost during dehydration. Follow instructions from your health care provider for rehydration. The kind of fluid and amount you should drink depend on your condition. Slowly increase how much you drink until you have taken the amount recommended by your health care provider. Contact your health care provider if you continue to show signs of mild or moderate dehydration. This information is not intended to replace advice given to you by your health care provider. Make sure you discuss any questions you have with your health care provider. Document Revised: 04/01/2019 Document Reviewed: 02/09/2019 Elsevier Patient  Education  2022 [place].  Diarrhea, Adult Diarrhea is frequent loose and watery bowel movements. Diarrhea can make you feel weak and cause you to become dehydrated. Dehydration can make you tired and thirsty, cause you to have a dry mouth, and decrease how often you urinate. Diarrhea typically lasts 2-3 days. However, it can last longer if it is a sign of something more serious. It is important to treat your diarrhea as told by your health care provider. Follow these instructions at home: Eating and drinking   Follow these recommendations as told by your health care provider: Take an oral rehydration solution (ORS). This is an over-the-counter medicine that helps return your body to its normal balance of nutrients and water. It is found at pharmacies and retail stores. Drink plenty of fluids, such as water, ice chips, diluted fruit juice, and low-calorie sports drinks. You can drink milk also, if desired. Avoid drinking fluids that contain a lot of sugar or caffeine, such as energy drinks, sports drinks, and soda. Eat bland, easy-to-digest foods in small amounts as you are able. These foods include bananas, applesauce, rice, lean meats, toast, and crackers. Avoid alcohol. Avoid spicy or fatty foods.  Medicines Take over-the-counter and prescription medicines only as told by your health care provider. If you were prescribed an antibiotic medicine, take it as told by your health care provider. Do not stop using the antibiotic even if you start to feel better. General instructions  Wash  your hands often using soap and water. If soap and water are not available, use a hand sanitizer. Others in the household should wash their hands as well. Hands should be washed: After using the toilet or changing a diaper. Before preparing, cooking, or serving food. While caring for a sick person or while visiting someone in a hospital. Drink enough fluid to keep your urine pale yellow. Rest at home  while you recover. Watch your condition for any changes. Take a warm bath to relieve any burning or pain from frequent diarrhea episodes. Keep all follow-up visits as told by your health care provider. This is important. Contact a health care provider if: You have a fever. Your diarrhea gets worse. You have new symptoms. You cannot keep fluids down. You feel light-headed or dizzy. You have a headache. You have muscle cramps. Get help right away if: You have chest pain. You feel extremely weak or you faint. You have bloody or black stools or stools that look like tar. You have severe pain, cramping, or bloating in your abdomen. You have trouble breathing or you are breathing very quickly. Your heart is beating very quickly. Your skin feels cold and clammy. You feel confused. You have signs of dehydration, such as: Dark urine, very little urine, or no urine. Cracked lips. Dry mouth. Sunken eyes. Sleepiness. Weakness. Summary Diarrhea is frequent loose and sometimes watery bowel movements. Diarrhea can make you feel weak and cause you to become dehydrated. Drink enough fluids to keep your urine pale yellow. Make sure that you wash your hands after using the toilet. If soap and water are not available, use hand sanitizer. Contact a health care provider if your diarrhea gets worse or you have new symptoms. Get help right away if you have signs of dehydration. This information is not intended to replace advice given to you by your health care provider. Make sure you discuss any questions you have with your health care provider. Document Revised: 08/10/2020 Document Reviewed: 08/10/2020 Else[employer] Patient Education  [place].

## 2021-02-28 NOTE — Progress Notes (Signed)
Holding treatment today per Cira Rue, NP. Patient to get IV fluids only today.

## 2021-02-28 NOTE — Progress Notes (Signed)
[place]   Telephone:([phone] Fax:([phone]) [phone]   Clinic Follow up Note   Patient Care Team: [name], MD as PCP - General (Internal Medicine) [name], [name], MD as PCP - Cardiology (Cardiology) [name], MD as PCP - Electrophysiology (Cardiology) [name], MD as Consulting Physician (General Surgery) Truitt Merle, MD as Consulting Physician (Hematology) 02/28/2021  CHIEF COMPLAINT: Follow-up gastric cancer  SUMMARY OF ONCOLOGIC HISTORY: Oncology History Overview Note  Cancer Staging Gastric cancer [facility]) Staging form: Stomach, AJCC 8th Edition - Clinical stage from 12/01/2020: Stage IIB (cT3, cN0, cM0) - Signed by Truitt Merle, MD on 12/20/2020 Stage prefix: Initial diagnosis Total positive nodes: 0  Malignant neoplasm of prostate ([place]) Staging form: Prostate, AJCC 8th Edition - Clinical: Stage IIC (cT2b, cN0, cM0, PSA: 6.7, Grade Group: 4) - Unsigned Prostate specific antigen (PSA) range: Less than 10 Gleason score: 8 Histologic grading system: 5 grade system    Gastric cancer ([place])  12/01/2020 Procedure   Upper Endoscopy, Dr. Benson Norway  Impression: - Normal esophagus. 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 Biopsied. - Normal examined duodenum.   12/01/2020 Pathology Results   FINAL MICROSCOPIC DIAGNOSIS:   A. INCISURA, BIOPSY:  - Adenocarcinoma, moderate to poorly differentiated arising in a  background of chronic gastritis with intestinal metaplasia.    12/01/2020 Imaging   CT CAP  IMPRESSION: Focal wall thickening along the posterior aspect of the gastric antrum, likely corresponding to the patient's newly diagnosed gastric cancer.   No findings suspicious for metastatic disease.   Mild multifocal pneumonia, lower lobe predominant, likely on the basis of aspiration. Trace right pleural effusion.   Fiducial markers along the prostate in this patient with known prostate cancer.   12/01/2020 Cancer Staging    Staging form: Stomach, AJCC 8th Edition - Clinical stage from 12/01/2020: Stage IIB (cT3, cN0, cM0) - Signed by Truitt Merle, MD on 12/20/2020 Stage prefix: Initial diagnosis Total positive nodes: 0    12/08/2020 Initial Diagnosis   Gastric cancer ([place])   12/16/2020 Procedure   EUS  - Wall thickening was seen in the lesser curve of the stomach and in the antrum of the stomach. The thickening appeared to be primarily within the serosa (Layer 5). T3 N0 Mx. - There was no sign of significant pathology in the entire pancreas. - There was no sign of significant pathology in the common bile duct and in the gallbladder. - There was no evidence of significant pathology in the left lobe of the liver. - Endosonographic images of the left adrenal gland were unremarkable. - The celiac trunk and superior mesenteric artery were endosonographically normal. - The mediastinum was unremarkable endosonographically. - No specimens collected.   01/02/2021 -  Chemotherapy   Patient is on Treatment Plan : GASTROESOPHAGEAL FLOT q14d X 4 cycles       CURRENT THERAPY: Neoadjuvant FLOT4, every 2 weeks, started 01/02/2021  INTERVAL HISTORY: Jon Williams returns for follow-up and treatment as scheduled.  Last seen by Dr. Annamaria Boots 02/15/2021 and completed cycle 4. He is "maintaining."  He developed worsening pain and tightness in the right foot, went to podiatrist who put him on precautionary doxycycline.  While he was in the waiting room he had multiple episodes of diarrhea and N/V x1.  He had more diarrhea and an incontinent episode last night.  He finally took 2 Imodium without further BM.  He feels weak, not drinking enough.  He has lost some weight he attributes to bad taste  in his mouth and aversions to smells.  He has developed mild intermittent numbness and tingling in the absence of cold exposure since last chemo. Hands are dry and dark, applies utterly smooth. He continues to have tearing and rhinorrhea with occasional  blood.  Denies other significant bleeding.  Otherwise, denies abdominal pain, fever, chills, cough, chest pain, dyspnea, leg edema, or other new complaints.   MEDICAL HISTORY:  Past Medical History:  Diagnosis Date   AICD (automatic cardioverter/defibrillator) present 05/25/2016   biv icd   Bunion, right    Chronic combined systolic and diastolic CHF (congestive heart failure) ([place])    Echo 1/18: Mild conc LVH, EF 15-20, severe diff HK, inf and inf-septal AK, Gr 3 DD, mild to mod MR, severe LAE, mod reduced RVSF, mod RAE, mild TR, PASP 50   Coronary artery disease involving native coronary artery without angina pectoris 04/17/2016   LHC 1/18: pLCx 97, mLCx 20, mRCA 40, dRCA 20, LVEDP 23, mean RA 8, PA 42/20, PCWP 17   Diabetes mellitus without complication (HCC)    DJD (degenerative joint disease)    History of atrial fibrillation    History of atrial flutter    History of cardiomegaly 06/07/2016   Noted on CXR   History of colon polyps 06/28/2017   Noted on colonoscopy   LBBB (left bundle branch block)    NICM (nonischemic cardiomyopathy) ([place])    Echo 1/18:  Mild conc LVH, EF 15-20, severe diff HK, inf and inf-septal AK, Gr 3 DD, mild to mod MR, severe LAE, mod reduced RVSF, mod RAE, mild TR, PASP 50   OA (osteoarthritis)    knee   Other secondary pulmonary hypertension ([place]) 04/17/2016   Prostate cancer ([place]) 2019   Sigmoid diverticulosis 06/28/2017   Noted on colonoscopy    SURGICAL HISTORY: Past Surgical History:  Procedure Laterality Date   BIOPSY  12/01/2020   Procedure: BIOPSY;  Surgeon: [name], MD;  Location: [facility];  Service: Endoscopy;;   BIV ICD INSERTION CRT-D N/A 05/25/2016   Procedure: BiV ICD Insertion CRT-D;  Surgeon: [name], MD;  Location: [facility];  Service: Cardiovascular;  Laterality: N/A;   CARDIAC CATHETERIZATION N/A 03/02/2016   Procedure: Right/Left Heart Cath and Coronary Angiography;  Surgeon: Nelva Bush, MD;   Location: [facility];  Service: Cardiovascular;  Laterality: N/A;   CARDIOVERSION N/A 07/17/2016   Procedure: Cardioversion;  Surgeon: [name], MD;  Location: [facility];  Service: Cardiovascular;  Laterality: N/A;   COLONOSCOPY WITH PROPOFOL N/A 06/28/2017   Procedure: COLONOSCOPY WITH PROPOFOL;  Surgeon: [name], MD;  Location: [facility];  Service: Endoscopy;  Laterality: N/A;   colonscopy  2009   ESOPHAGOGASTRODUODENOSCOPY N/A 12/01/2020   Procedure: ESOPHAGOGASTRODUODENOSCOPY (EGD);  Surgeon: [name], MD;  Location: [facility];  Service: Endoscopy;  Laterality: N/A;  IDA/guaiac positive stools   ESOPHAGOGASTRODUODENOSCOPY (EGD) WITH PROPOFOL N/A 12/16/2020   Procedure: ESOPHAGOGASTRODUODENOSCOPY (EGD) WITH PROPOFOL;  Surgeon: [name], MD;  Location: [facility];  Service: Endoscopy;  Laterality: N/A;   GOLD SEED IMPLANT N/A 12/24/2017   Procedure: GOLD SEED IMPLANT, TRANSERINEAL;  Surgeon: Festus Aloe, MD;  Location: [facility];  Service: Urology;  Laterality: N/A;   INSERT / REPLACE / REMOVE PACEMAKER     LEAD REVISION  10/10/2018   LEAD REVISION/REPAIR N/A 10/10/2018   Procedure: LEAD REVISION/REPAIR;  Surgeon: [name], MD;  Location: [facility];  Service: Cardiovascular;  Laterality: N/A;  POLYPECTOMY  06/28/2017   Procedure: POLYPECTOMY;  Surgeon: [name], MD;  Location: [facility];  Service: Endoscopy;;  ascending and descending colon polyp   PORTACATH PLACEMENT Right 12/23/2020   Procedure: INSERTION PORT-A-CATH;  Surgeon: [name], MD;  Location: [facility];  Service: General;  Laterality: Right;   PROSTATE BIOPSY  02/20/2017   SPACE OAR INSTILLATION N/A 12/24/2017   Procedure: SPACE OAR INSTILLATION;  Surgeon: Festus Aloe, MD;  Location: [facility];  Service: Urology;  Laterality: N/A;   TOTAL KNEE ARTHROPLASTY Right 08/16/2017   Procedure: RIGHT TOTAL KNEE ARTHROPLASTY;  Surgeon: [name], MD;   Location: [facility];  Service: Orthopedics;  Laterality: Right;   UPPER ESOPHAGEAL ENDOSCOPIC ULTRASOUND (EUS) N/A 12/16/2020   Procedure: UPPER ESOPHAGEAL ENDOSCOPIC ULTRASOUND (EUS);  Surgeon: [name], MD;  Location: [facility];  Service: Endoscopy;  Laterality: N/A;    I have reviewed the social history and family history with the patient and they are unchanged from previous note.  ALLERGIES:  is allergic to aspirin, sulfa antibiotics, and other.  MEDICATIONS:  Current Outpatient Medications  Medication Sig Dispense Refill   atorvastatin (LIPITOR) 40 MG tablet TAKE 1 TABLET(40 MG) BY MOUTH DAILY 90 tablet 0   carvedilol (COREG) 3.125 MG tablet TAKE 1 TABLET(3.125 MG) BY MOUTH TWICE DAILY 180 tablet 0   cholecalciferol (VITAMIN D3) 25 MCG (1000 UT) tablet Take 1,000 Units by mouth daily with lunch.      dexamethasone (DECADRON) 4 MG tablet Take 2 tablets (8 mg total) by mouth daily. Take 1 tab daily starting the day after chemotherapy for 2 days. Take with food. 30 tablet 1   docusate sodium (COLACE) 100 MG capsule Take 1 capsule (100 mg total) by mouth 2 (two) times daily as needed for mild constipation. 20 capsule 0   doxycycline (VIBRA-TABS) 100 MG tablet Take 1 tablet (100 mg total) by mouth 2 (two) times daily. 20 tablet 1   furosemide (LASIX) 40 MG tablet Take 1 tablet (40 mg total) by mouth 2 (two) times daily. 180 tablet 3   lidocaine-prilocaine (EMLA) cream Apply to affected area once 30 g 3   metFORMIN (GLUCOPHAGE-XR) 500 MG 24 hr tablet Take 500 mg by mouth every evening.     Multiple Vitamin (MULTIVITAMIN WITH MINERALS) TABS tablet Take 1 tablet by mouth daily with lunch. One-A-Day     Naphazoline HCl (CLEAR EYES OP) Place 1 drop into both eyes daily as needed (irritation).     ondansetron (ZOFRAN) 4 MG tablet Take 1 tablet (4 mg total) by mouth every 6 (six) hours as needed for nausea. 20 tablet 0   ondansetron (ZOFRAN) 8 MG tablet Take 1 tablet (8 mg total) by mouth 2 (two)  times daily as needed for refractory nausea / vomiting. Start on day 3 after chemotherapy. 30 tablet 1   pantoprazole (PROTONIX) 40 MG tablet Take 1 tablet (40 mg total) by mouth 2 (two) times daily. 60 tablet 0   polyethylene glycol (MIRALAX / GLYCOLAX) 17 g packet Take 17 g by mouth daily as needed for moderate constipation. 14 each 0   potassium chloride (KLOR-CON) 10 MEQ tablet TAKE 1 TABLET(10 MEQ) BY MOUTH TWICE DAILY 180 tablet 3   prochlorperazine (COMPAZINE) 10 MG tablet Take 1 tablet (10 mg total) by mouth every 6 (six) hours as needed (Nausea or vomiting). 30 tablet 2   sacubitril-valsartan (ENTRESTO) 24-26 MG Take 1 tablet by mouth 2 (two) times daily. 180 tablet 3   sotalol (BETAPACE)  80 MG tablet Take 120 mg by mouth in the morning and at bedtime.     No current facility-administered medications for this visit.    PHYSICAL EXAMINATION: ECOG PERFORMANCE STATUS: 1-2  Vitals:   02/28/21 0858  BP: 128/85  Pulse: 62  Resp: 20  Temp: (!) 96.5 F (35.8 C)  SpO2: 99%   Filed Weights   02/28/21 0858  Weight: 194 lb (88 kg)    GENERAL:alert, no distress and comfortable SKIN: Palms dry with hyperpigmentation and some cracks.  No deformities.  Abrasion of the right first metatarsal head, no drainage.  See image below EYES:  sclera clear NECK: Without mass LUNGS: clear with normal breathing effort HEART: regular rate & rhythm, no lower extremity edema ABDOMEN:abdomen soft, non-tender and normal bowel sounds NEURO: alert & oriented x 3 with fluent speech PAC without erythema    LABORATORY DATA:  I have reviewed the data as listed CBC Latest Ref Rng & Units 02/28/2021 02/15/2021 01/31/2021  WBC 4.0 - 10.5 K/uL 12.7(H) 3.9(L) 16.2(H)  Hemoglobin 13.0 - 17.0 g/dL 12.4(L) 11.1(L) 11.2(L)  Hematocrit 39.0 - 52.0 % 38.0(L) 33.9(L) 35.8(L)  Platelets 150 - 400 K/uL 104(L) 164 141(L)     CMP Latest Ref Rng & Units 02/28/2021 02/15/2021 01/31/2021  Glucose 70 - 99 mg/dL 120(H)  136(H) 112(H)  BUN 8 - 23 mg/dL _0 Creatinine 0.61 - 1.24 mg/dL 1.46(H) 1.38(H) 1.18  Sodium 135 - 145 mmol/L 138 140 139  Potassium 3.5 - 5.1 mmol/L 3.5 3.7 3.8  Chloride 98 - 111 mmol/L 104 106 106  CO2 22 - 32 mmol/L _1 Calcium 8.9 - 10.3 mg/dL 9.1 8.7(L) 9.0  Total Protein 6.5 - 8.1 g/dL 6.8 6.6 6.5  Total Bilirubin 0.3 - 1.2 mg/dL 0.8 0.7 0.5  Alkaline Phos 38 - 126 U/L 61 41 66  AST 15 - 41 U/L _2 ALT 0 - 44 U/L _3 RADIOGRAPHIC STUDIES: I have personally reviewed the radiological images as listed and agreed with the findings in the report. No results found.   ASSESSMENT & PLAN: Jon Williams is a 71 y.o. male with    1. Gastric Cancer, cT3N0M0, stage II -initially referred to ED by PCP on 11/30/20 for severe anemia hgb 7.4. He was also recently treated for H-pylori gastritis. EGD on 12/01/20 by Dr. Benson Norway showed a tumor at the incisura, which pathology confirmed was adenocarcinoma.  -CT CAP 12/01/20 showed no metastatic disease. -He met with Dr. Zenia Resides on 12/05/20. -He underwent echo on 10/26 showing reduced EF and severe LV dysfunction. Per Dr. Lovena Le, surgery is recommended only if it is curative given his moderate risk. -He proceeded to EUS on 12/16/20 under Dr. Benson Norway, staging T3 N0. -IHC returned abnormal, showing loss of expression in MLH1 and PMS2. Which predict good response to immunotherapy  -port placed 12/23/20 -he started FLOT4 with dose reduction on 01/02/21, increased to full dose with C2. He has tolerated very well overall. -Mr. Cianci appears stable. S/p cycle 4 neoadjuvant FLOT he had n/v/d episode over the weekend and appears dehydrated. He also has signs of early CIPN. He has developed a wound on the R foot bunion deformity  -given his risk for complications, I recommend to postpone C5 for 1 week and give supportive care hydration today. Will do 750 cc over 2 hours due to his HF and low EF. Will monitor closely -I plan to call  him later this week for close monitoring, then f/up in 1 week with C5 if he has improved.   2.  Right foot deformity, wound  -He developed worsening pain and tightness in the right foot, went to podiatry 02/24/2021 -X-ray, per MD note, showed significant structural bunion deformity with no osteolysis or soft tissue budding  -He has an abrasion with open skin at the right metatarsal head, on prophylactic doxycycline -We discussed the risk of infection and other complications if we proceed with chemo today, the decision was made to hold treatment -He will complete Doxy in 5 days. F/up Dr. Ila Mcgill  3.  CIPN, G1 -He developed mild intermittent numbness and tingling after cycle 4 FLOT, secondary to oxaliplatin and taxotere -I recommend to start B complex vitamin  4. H/o Prostate Cancer  -diagnosed with Gleason 4+4 prostate cancer in early 2019. He was treated with long-term ADT in combination with 8 weeks of IMRT.   5. Diabetes, atrial fibrillation, severe cardiomyopathy with EF 20 to 25%, status post ICD placement -Follow-up with PCP and cardiology -no signs of fluid overload thus far, continue monitoring closely   6. Iron deficient anemia -Secondary to gastric cancer -received 2 doses of IV Venofer, last 12/20/20 -hg stable   PLAN: -Labs, podiatry note reviewed -Hold FLOT chemo today -Supportive care IVF 750 ml over 2 hours -C.dif sample if he has liquid stool   -Complete doxy per podiatry -Reviewed symptom management for GI, skin SE's, nerve health, and hydration -Return in 1 week for f/up and C5 if he has recovered well  The case was reviewed with Dr. Burr Medico who agrees with my plan.  Orders Placed This Encounter  Procedures   C difficile quick screen w PCR reflex    Standing Status:   Future    Standing Expiration Date:   02/28/2022   All questions were answered. The patient knows to call the clinic with any problems, questions or concerns. No barriers to learning was  detected. I spent 30 minutes counseling the patient face to face. The total time spent in the appointment was 40 minutes and more than 50% was on counseling and review of test results, and coordination of care.      Alla Feeling, NP 02/28/21

## 2021-03-01 ENCOUNTER — Ambulatory Visit: Payer: Medicare HMO | Admitting: Podiatry

## 2021-03-01 ENCOUNTER — Other Ambulatory Visit: Payer: Self-pay

## 2021-03-01 ENCOUNTER — Ambulatory Visit: Payer: Medicare HMO

## 2021-03-01 DIAGNOSIS — I4819 Other persistent atrial fibrillation: Secondary | ICD-10-CM

## 2021-03-01 MED ORDER — SOTALOL HCL 80 MG PO TABS: 120.0000 mg | ORAL_TABLET | Freq: Two times a day (BID) | ORAL | 2 refills | Status: DC

## 2021-03-02 ENCOUNTER — Telehealth: Payer: Self-pay | Admitting: Nurse Practitioner

## 2021-03-02 NOTE — Telephone Encounter (Signed)
Called Mr. Qazi to check on him. He has 2 more days of antibiotics, foot is improving. Denies further wound issues, fever, chills. He thinks fluids helped him on Tuesday. C.dif was not collected. He had no diarrhea until this morning x1 episode which resolved on it's own. We reviewed symptom management and meds. He is eating and drinking during the call. Feels ok to come in next week for chemo. He knows to call back if he has new/worsening issues or concerns between now and then. He appreciates the call.   Cira Rue, NP

## 2021-03-03 ENCOUNTER — Telehealth: Payer: Self-pay | Admitting: Hematology

## 2021-03-03 NOTE — Telephone Encounter (Signed)
Scheduled follow-up appointments per 1/17 los. Patient is aware. °

## 2021-03-06 ENCOUNTER — Encounter: Payer: Self-pay | Admitting: Podiatry

## 2021-03-06 ENCOUNTER — Ambulatory Visit: Payer: Medicare HMO | Admitting: Podiatry

## 2021-03-06 ENCOUNTER — Other Ambulatory Visit: Payer: Self-pay

## 2021-03-06 DIAGNOSIS — L02619 Cutaneous abscess of unspecified foot: Secondary | ICD-10-CM | POA: Diagnosis not present

## 2021-03-06 DIAGNOSIS — I739 Peripheral vascular disease, unspecified: Secondary | ICD-10-CM | POA: Diagnosis not present

## 2021-03-06 DIAGNOSIS — L03119 Cellulitis of unspecified part of limb: Secondary | ICD-10-CM | POA: Diagnosis not present

## 2021-03-06 DIAGNOSIS — M21619 Bunion of unspecified foot: Secondary | ICD-10-CM

## 2021-03-06 NOTE — Progress Notes (Signed)
[place]   Telephone:([phone] Fax:([phone]) [phone]   Clinic Follow up Note   Patient Care Team: [name], MD as PCP - General (Internal Medicine) [name], [name], MD as PCP - Cardiology (Cardiology) [name], MD as PCP - Electrophysiology (Cardiology) [name], MD as Consulting Physician (General Surgery) Truitt Merle, MD as Consulting Physician (Hematology)  Date of Service:  03/07/2021  CHIEF COMPLAINT: f/u of gastric cancer  CURRENT THERAPY:  Neoadjuvant FLOT4, every 2 weeks, started 01/02/2021  ASSESSMENT & PLAN:  Jon Williams is a 71 y.o. male with   1. Gastric Cancer, cT3N0M0, stage II -initially referred to ED by PCP on 11/30/20 for severe anemia hgb 7.4. He was also recently treated for H-pylori gastritis. EGD on 12/01/20 by Dr. Benson Norway showed a tumor at the incisura, which pathology confirmed was adenocarcinoma.  -CT CAP 12/01/20 showed no metastatic disease. -He met with Dr. Zenia Resides on 12/05/20. -He underwent echo on 10/26 showing reduced EF and severe LV dysfunction. Per Dr. Lovena Le, surgery is recommended only if it is curative given his moderate risk. -He proceeded to EUS on 12/16/20 under Dr. Benson Norway, staging T3 N0. -IHC returned abnormal, showing loss of expression in MLH1 and PMS2. Which predict good response to immunotherapy  -port placed 12/23/20 -he started FLOT4 with dose reduction on 01/02/21, increased to full dose with  -He had a few episodes of nausea, vomiting, diarrhea, and open wound in the right foot last week, chemo was held. -He has recovered well, the wound in the right foot is much smaller now, he is done with antibiotics.  Lab reviewed, adequate for treatment, will proceed cycle 5 today with slight dose reduction.   2.  Right foot deformity, wound  -He developed worsening pain and tightness in the right foot, went to podiatry 02/24/21 -X-ray, per MD note, showed significant structural bunion deformity with no  osteolysis or soft tissue budding  -He developed an abrasion with open skin at the right metatarsal head, and was placed on prophylactic doxycycline which he has completed  -the wound is small and superficial now, healing well.   3.  CIPN, G1 -He developed mild intermittent numbness and tingling after cycle 4 FLOT, secondary to oxaliplatin and taxotere -I recommend to start B complex vitamin   4. H/o Prostate Cancer  -diagnosed with Gleason 4+4 prostate cancer in early 2019. He was treated with long-term ADT in combination with 8 weeks of IMRT.   5. Diabetes, atrial fibrillation, severe cardiomyopathy with EF 20 to 25%, status post ICD placement -Follow-up with PCP and cardiology -no signs of fluid overload thus far, continue monitoring closely   6. Iron deficient anemia -Secondary to gastric cancer -received 2 doses of IV Venofer, last 12/20/20 -hg stable     PLAN: -will proceed to cycle 5 chemo today with slight dose reduction on 5-FU and oxaliplatin with GCSF tomorrow  -f/u in 2 weeks before cycle 6 chemo    No problem-specific Assessment & Plan notes found for this encounter.   SUMMARY OF ONCOLOGIC HISTORY: Oncology History Overview Note  Cancer Staging Gastric cancer [facility]) Staging form: Stomach, AJCC 8th Edition - Clinical stage from 12/01/2020: Stage IIB (cT3, cN0, cM0) - Signed by Truitt Merle, MD on 12/20/2020 Stage prefix: Initial diagnosis Total positive nodes: 0  Malignant neoplasm of prostate ([place]) Staging form: Prostate, AJCC 8th Edition - Clinical: Stage IIC (cT2b, cN0, cM0, PSA: 6.7, Grade Group: 4) - Unsigned Prostate specific antigen (PSA) range: Less  than 10 Gleason score: 8 Histologic grading system: 5 grade system    Gastric cancer ([place])  12/01/2020 Procedure   Upper Endoscopy, Dr. Benson Norway  Impression: - Normal esophagus. 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 Biopsied. - Normal examined duodenum.   12/01/2020 Pathology Results   FINAL  MICROSCOPIC DIAGNOSIS:   A. INCISURA, BIOPSY:  - Adenocarcinoma, moderate to poorly differentiated arising in a  background of chronic gastritis with intestinal metaplasia.    12/01/2020 Imaging   CT CAP  IMPRESSION: Focal wall thickening along the posterior aspect of the gastric antrum, likely corresponding to the patient's newly diagnosed gastric cancer.   No findings suspicious for metastatic disease.   Mild multifocal pneumonia, lower lobe predominant, likely on the basis of aspiration. Trace right pleural effusion.   Fiducial markers along the prostate in this patient with known prostate cancer.   12/01/2020 Cancer Staging   Staging form: Stomach, AJCC 8th Edition - Clinical stage from 12/01/2020: Stage IIB (cT3, cN0, cM0) - Signed by Truitt Merle, MD on 12/20/2020 Stage prefix: Initial diagnosis Total positive nodes: 0    12/08/2020 Initial Diagnosis   Gastric cancer ([place])   12/16/2020 Procedure   EUS  - Wall thickening was seen in the lesser curve of the stomach and in the antrum of the stomach. The thickening appeared to be primarily within the serosa (Layer 5). T3 N0 Mx. - There was no sign of significant pathology in the entire pancreas. - There was no sign of significant pathology in the common bile duct and in the gallbladder. - There was no evidence of significant pathology in the left lobe of the liver. - Endosonographic images of the left adrenal gland were unremarkable. - The celiac trunk and superior mesenteric artery were endosonographically normal. - The mediastinum was unremarkable endosonographically. - No specimens collected.   01/02/2021 -  Chemotherapy   Patient is on Treatment Plan : GASTROESOPHAGEAL FLOT q14d X 4 cycles        INTERVAL HISTORY:  [name] is here for a follow up of gastric cancer. He was last seen by NP Lacie on 02/28/21. He presents to the clinic accompanied by his wife.  Chemo was held last week due to a few episodes of  nausea, diarrhea, and open wound in the right foot.  He has recovered well, no nausea or diarrhea, eating well, his right foot wound is healing well. No other new complains.    All other systems were reviewed with the patient and are negative.  MEDICAL HISTORY:  Past Medical History:  Diagnosis Date   AICD (automatic cardioverter/defibrillator) present 05/25/2016   biv icd   Bunion, right    Chronic combined systolic and diastolic CHF (congestive heart failure) ([place])    Echo 1/18: Mild conc LVH, EF 15-20, severe diff HK, inf and inf-septal AK, Gr 3 DD, mild to mod MR, severe LAE, mod reduced RVSF, mod RAE, mild TR, PASP 50   Coronary artery disease involving native coronary artery without angina pectoris 04/17/2016   LHC 1/18: pLCx 99, mLCx 20, mRCA 40, dRCA 20, LVEDP 23, mean RA 8, PA 42/20, PCWP 17   Diabetes mellitus without complication (HCC)    DJD (degenerative joint disease)    History of atrial fibrillation    History of atrial flutter    History of cardiomegaly 06/07/2016   Noted on CXR   History of colon polyps 06/28/2017   Noted on colonoscopy   LBBB (left bundle branch block)  NICM (nonischemic cardiomyopathy) ([place])    Echo 1/18:  Mild conc LVH, EF 15-20, severe diff HK, inf and inf-septal AK, Gr 3 DD, mild to mod MR, severe LAE, mod reduced RVSF, mod RAE, mild TR, PASP 50   OA (osteoarthritis)    knee   Other secondary pulmonary hypertension ([place]) 04/17/2016   Prostate cancer ([place]) 2019   Sigmoid diverticulosis 06/28/2017   Noted on colonoscopy    SURGICAL HISTORY: Past Surgical History:  Procedure Laterality Date   BIOPSY  12/01/2020   Procedure: BIOPSY;  Surgeon: [name], MD;  Location: [facility];  Service: Endoscopy;;   BIV ICD INSERTION CRT-D N/A 05/25/2016   Procedure: BiV ICD Insertion CRT-D;  Surgeon: [name], MD;  Location: [facility];  Service: Cardiovascular;  Laterality: N/A;   CARDIAC CATHETERIZATION N/A 03/02/2016   Procedure:  Right/Left Heart Cath and Coronary Angiography;  Surgeon: Nelva Bush, MD;  Location: [facility];  Service: Cardiovascular;  Laterality: N/A;   CARDIOVERSION N/A 07/17/2016   Procedure: Cardioversion;  Surgeon: [name], MD;  Location: [facility];  Service: Cardiovascular;  Laterality: N/A;   COLONOSCOPY WITH PROPOFOL N/A 06/28/2017   Procedure: COLONOSCOPY WITH PROPOFOL;  Surgeon: [name], MD;  [facility];  Service: Endoscopy;  Laterality: N/A;   colonscopy  2009   ESOPHAGOGASTRODUODENOSCOPY N/A 12/01/2020   Procedure: ESOPHAGOGASTRODUODENOSCOPY (EGD);  Surgeon: [name], MD;  Location: [facility];  Service: Endoscopy;  Laterality: N/A;  IDA/guaiac positive stools   ESOPHAGOGASTRODUODENOSCOPY (EGD) WITH PROPOFOL N/A 12/16/2020   Procedure: ESOPHAGOGASTRODUODENOSCOPY (EGD) WITH PROPOFOL;  Surgeon: [name], MD;  [facility];  Service: Endoscopy;  Laterality: N/A;   GOLD SEED IMPLANT N/A 12/24/2017   Procedure: GOLD SEED IMPLANT, TRANSERINEAL;  Surgeon: Festus Aloe, MD;  [facility];  Service: Urology;  Laterality: N/A;   INSERT / REPLACE / REMOVE PACEMAKER     LEAD REVISION  10/10/2018   LEAD REVISION/REPAIR N/A 10/10/2018   Procedure: LEAD REVISION/REPAIR;  Surgeon: [name], MD;  Location: [facility];  Service: Cardiovascular;  Laterality: N/A;   POLYPECTOMY  06/28/2017   Procedure: POLYPECTOMY;  Surgeon: [name], MD;  [facility];  Service: Endoscopy;;  ascending and descending colon polyp   PORTACATH PLACEMENT Right 12/23/2020   Procedure: INSERTION PORT-A-CATH;  Surgeon: [name], MD;  Location: [facility];  Service: General;  Laterality: Right;   PROSTATE BIOPSY  02/20/2017   SPACE OAR INSTILLATION N/A 12/24/2017   Procedure: SPACE OAR INSTILLATION;  Surgeon: Festus Aloe, MD;  [facility];  Service: Urology;  Laterality: N/A;   TOTAL KNEE ARTHROPLASTY Right 08/16/2017    Procedure: RIGHT TOTAL KNEE ARTHROPLASTY;  Surgeon: [name], MD;  Location: [facility];  Service: Orthopedics;  Laterality: Right;   UPPER ESOPHAGEAL ENDOSCOPIC ULTRASOUND (EUS) N/A 12/16/2020   Procedure: UPPER ESOPHAGEAL ENDOSCOPIC ULTRASOUND (EUS);  Surgeon: [name], MD;  Location: [facility];  Service: Endoscopy;  Laterality: N/A;    I have reviewed the social history and family history with the patient and they are unchanged from previous note.  ALLERGIES:  is allergic to aspirin, sulfa antibiotics, and other.  MEDICATIONS:  Current Outpatient Medications  Medication Sig Dispense Refill   atorvastatin (LIPITOR) 40 MG tablet TAKE 1 TABLET(40 MG) BY MOUTH DAILY 90 tablet 0   carvedilol (COREG) 3.125 MG tablet TAKE 1 TABLET(3.125 MG) BY MOUTH TWICE DAILY 180 tablet 0   cholecalciferol (VITAMIN D3) 25 MCG (  1000 UT) tablet Take 1,000 Units by mouth daily with lunch.      dexamethasone (DECADRON) 4 MG tablet Take 2 tablets (8 mg total) by mouth daily. Take 1 tab daily starting the day after chemotherapy for 2 days. Take with food. 30 tablet 1   docusate sodium (COLACE) 100 MG capsule Take 1 capsule (100 mg total) by mouth 2 (two) times daily as needed for mild constipation. 20 capsule 0   furosemide (LASIX) 40 MG tablet Take 1 tablet (40 mg total) by mouth 2 (two) times daily. 180 tablet 3   lidocaine-prilocaine (EMLA) cream Apply to affected area once 30 g 3   metFORMIN (GLUCOPHAGE-XR) 500 MG 24 hr tablet Take 500 mg by mouth every evening.     Multiple Vitamin (MULTIVITAMIN WITH MINERALS) TABS tablet Take 1 tablet by mouth daily with lunch. One-A-Day     Naphazoline HCl (CLEAR EYES OP) Place 1 drop into both eyes daily as needed (irritation).     ondansetron (ZOFRAN) 4 MG tablet Take 1 tablet (4 mg total) by mouth every 6 (six) hours as needed for nausea. 20 tablet 0   ondansetron (ZOFRAN) 8 MG tablet Take 1 tablet (8 mg total) by mouth 2 (two) times daily as needed for refractory  nausea / vomiting. Start on day 3 after chemotherapy. 30 tablet 1   pantoprazole (PROTONIX) 40 MG tablet Take 1 tablet (40 mg total) by mouth 2 (two) times daily. 60 tablet 0   polyethylene glycol (MIRALAX / GLYCOLAX) 17 g packet Take 17 g by mouth daily as needed for moderate constipation. 14 each 0   potassium chloride (KLOR-CON) 10 MEQ tablet TAKE 1 TABLET(10 MEQ) BY MOUTH TWICE DAILY 180 tablet 3   prochlorperazine (COMPAZINE) 10 MG tablet Take 1 tablet (10 mg total) by mouth every 6 (six) hours as needed (Nausea or vomiting). 30 tablet 2   sacubitril-valsartan (ENTRESTO) 24-26 MG Take 1 tablet by mouth 2 (two) times daily. 180 tablet 3   sotalol (BETAPACE) 80 MG tablet Take 1.5 tablets (120 mg total) by mouth in the morning and at bedtime. 270 tablet 2   No current facility-administered medications for this visit.   Facility-Administered Medications Ordered in Other Visits  Medication Dose Route Frequency Provider Last Rate Last Admin   fluorouracil (ADRUCIL) 4,850 mg in sodium chloride 0.9 % 53 mL chemo infusion  2,300 mg/m2 (Treatment Plan Recorded) Intravenous 1 day or 1 dose Truitt Merle, MD   4,850 mg at 03/07/21 1709    PHYSICAL EXAMINATION: ECOG PERFORMANCE STATUS: 1 - Symptomatic but completely ambulatory  Vitals:   03/07/21 1107  BP: 103/67  Pulse: 60  Resp: 18  SpO2: 100%   Wt Readings from Last 3 Encounters:  03/07/21 199 lb 14.4 oz (90.7 kg)  02/28/21 194 lb (88 kg)  02/15/21 201 lb 11.2 oz (91.5 kg)     GENERAL:alert, no distress and comfortable SKIN: skin color, texture, turgor are normal, no rashes or significant lesions except a small wound in right metatarsal head which is covered. Per the picture he took at home, the wound is quite small and superficial now  EYES: normal, Conjunctiva are pink and non-injected, sclera clear NECK: supple, thyroid normal size, non-tender, without nodularity LYMPH:  no palpable lymphadenopathy in the cervical, axillary  LUNGS:  clear to auscultation and percussion with normal breathing effort HEART: regular rate & rhythm and no murmurs and no lower extremity edema ABDOMEN:abdomen soft, non-tender and normal bowel sounds Musculoskeletal:no cyanosis of digits  and no clubbing  NEURO: alert & oriented x 3 with fluent speech, no focal motor/sensory deficits  LABORATORY DATA:  I have reviewed the data as listed CBC Latest Ref Rng & Units 03/07/2021 02/28/2021 02/15/2021  WBC 4.0 - 10.5 K/uL 8.4 12.7(H) 3.9(L)  Hemoglobin 13.0 - 17.0 g/dL 11.3(L) 12.4(L) 11.1(L)  Hematocrit 39.0 - 52.0 % 34.7(L) 38.0(L) 33.9(L)  Platelets 150 - 400 K/uL 193 104(L) 164     CMP Latest Ref Rng & Units 03/07/2021 02/28/2021 02/15/2021  Glucose 70 - 99 mg/dL 126(H) 120(H) 136(H)  BUN 8 - 23 mg/dL _0 Creatinine 0.61 - 1.24 mg/dL 1.18 1.46(H) 1.38(H)  Sodium 135 - 145 mmol/L 133(L) 138 140  Potassium 3.5 - 5.1 mmol/L 3.8 3.5 3.7  Chloride 98 - 111 mmol/L 100 104 106  CO2 22 - 32 mmol/L _1 Calcium 8.9 - 10.3 mg/dL 9.2 9.1 8.7(L)  Total Protein 6.5 - 8.1 g/dL 6.2(L) 6.8 6.6  Total Bilirubin 0.3 - 1.2 mg/dL 0.7 0.8 0.7  Alkaline Phos 38 - 126 U/L 51 61 41  AST 15 - 41 U/L _2 ALT 0 - 44 U/L _3 RADIOGRAPHIC STUDIES: I have personally reviewed the radiological images as listed and agreed with the findings in the report. No results found.    Orders Placed This Encounter  Procedures   Iron and Iron Binding Capacity ([facility] only)   All questions were answered. The patient knows to call the clinic with any problems, questions or concerns. No barriers to learning was detected. The total time spent in the appointment was 30 minutes.     Truitt Merle, MD 03/07/2021   I, Wilburn Mylar, am acting as scribe for Truitt Merle, MD.   I have reviewed the above documentation for accuracy and completeness, and I agree with the above.

## 2021-03-07 ENCOUNTER — Encounter: Payer: Self-pay | Admitting: Hematology

## 2021-03-07 ENCOUNTER — Inpatient Hospital Stay: Payer: Medicare HMO

## 2021-03-07 ENCOUNTER — Telehealth: Payer: Self-pay | Admitting: Podiatry

## 2021-03-07 ENCOUNTER — Inpatient Hospital Stay: Payer: Medicare HMO | Admitting: Hematology

## 2021-03-07 VITALS — BP 103/67 | HR 60 | Resp 18 | Ht 66.0 in | Wt 199.9 lb

## 2021-03-07 DIAGNOSIS — R197 Diarrhea, unspecified: Secondary | ICD-10-CM | POA: Diagnosis not present

## 2021-03-07 DIAGNOSIS — Z95828 Presence of other vascular implants and grafts: Secondary | ICD-10-CM

## 2021-03-07 DIAGNOSIS — Z79899 Other long term (current) drug therapy: Secondary | ICD-10-CM | POA: Diagnosis not present

## 2021-03-07 DIAGNOSIS — C169 Malignant neoplasm of stomach, unspecified: Secondary | ICD-10-CM | POA: Diagnosis not present

## 2021-03-07 DIAGNOSIS — C163 Malignant neoplasm of pyloric antrum: Secondary | ICD-10-CM

## 2021-03-07 DIAGNOSIS — Z5189 Encounter for other specified aftercare: Secondary | ICD-10-CM | POA: Diagnosis not present

## 2021-03-07 DIAGNOSIS — Z5111 Encounter for antineoplastic chemotherapy: Secondary | ICD-10-CM | POA: Diagnosis not present

## 2021-03-07 DIAGNOSIS — D509 Iron deficiency anemia, unspecified: Secondary | ICD-10-CM | POA: Diagnosis not present

## 2021-03-07 LAB — CBC WITH DIFFERENTIAL/PLATELET
Abs Immature Granulocytes: 0.02 10*3/uL (ref 0.00–0.07)
Basophils Absolute: 0 10*3/uL (ref 0.0–0.1)
Basophils Relative: 0 %
Eosinophils Absolute: 0 10*3/uL (ref 0.0–0.5)
Eosinophils Relative: 0 %
HCT: 34.7 % — ABNORMAL LOW (ref 39.0–52.0)
Hemoglobin: 11.3 g/dL — ABNORMAL LOW (ref 13.0–17.0)
Immature Granulocytes: 0 %
Lymphocytes Relative: 8 %
Lymphs Abs: 0.6 10*3/uL — ABNORMAL LOW (ref 0.7–4.0)
MCH: 28.8 pg (ref 26.0–34.0)
MCHC: 32.6 g/dL (ref 30.0–36.0)
MCV: 88.5 fL (ref 80.0–100.0)
Monocytes Absolute: 1.1 10*3/uL — ABNORMAL HIGH (ref 0.1–1.0)
Monocytes Relative: 13 %
Neutro Abs: 6.6 10*3/uL (ref 1.7–7.7)
Neutrophils Relative %: 79 %
Platelets: 193 10*3/uL (ref 150–400)
RBC: 3.92 MIL/uL — ABNORMAL LOW (ref 4.22–5.81)
RDW: 27.4 % — ABNORMAL HIGH (ref 11.5–15.5)
WBC: 8.4 10*3/uL (ref 4.0–10.5)
nRBC: 0 % (ref 0.0–0.2)

## 2021-03-07 LAB — COMPREHENSIVE METABOLIC PANEL
ALT: 21 U/L (ref 0–44)
AST: 27 U/L (ref 15–41)
Albumin: 3.9 g/dL (ref 3.5–5.0)
Alkaline Phosphatase: 51 U/L (ref 38–126)
Anion gap: 8 (ref 5–15)
BUN: 18 mg/dL (ref 8–23)
CO2: 25 mmol/L (ref 22–32)
Calcium: 9.2 mg/dL (ref 8.9–10.3)
Chloride: 100 mmol/L (ref 98–111)
Creatinine, Ser: 1.18 mg/dL (ref 0.61–1.24)
GFR, Estimated: >60 mL/min (ref >60)
Glucose, Bld: 126 mg/dL — ABNORMAL HIGH (ref 70–99)
Potassium: 3.8 mmol/L (ref 3.5–5.1)
Sodium: 133 mmol/L — ABNORMAL LOW (ref 135–145)
Total Bilirubin: 0.7 mg/dL (ref 0.3–1.2)
Total Protein: 6.2 g/dL — ABNORMAL LOW (ref 6.5–8.1)

## 2021-03-07 LAB — CEA (IN HOUSE-CHCC): CEA (CHCC-In House): 11.55 ng/mL — ABNORMAL HIGH (ref 0.00–5.00)

## 2021-03-07 LAB — IRON AND IRON BINDING CAPACITY (CC-WL,HP ONLY)
Iron: 75 ug/dL (ref 45–182)
Saturation Ratios: 24 % (ref 17.9–39.5)
TIBC: 308 ug/dL (ref 250–450)
UIBC: 233 ug/dL (ref 117–376)

## 2021-03-07 LAB — FERRITIN: Ferritin: 311 ng/mL (ref 24–336)

## 2021-03-07 MED ORDER — LEUCOVORIN CALCIUM INJECTION 350 MG
200.0000 mg/m2 | Freq: Once | INTRAVENOUS | Status: AC
  Administered 2021-03-07: 15:00:00 420 mg via INTRAVENOUS
  Filled 2021-03-07: qty 21

## 2021-03-07 MED ORDER — SODIUM CHLORIDE 0.9% FLUSH
10.0000 mL | INTRAVENOUS | Status: AC | PRN
  Administered 2021-03-07: 11:00:00 10 mL

## 2021-03-07 MED ORDER — SODIUM CHLORIDE 0.9 % IV SOLN
50.0000 mg/m2 | Freq: Once | INTRAVENOUS | Status: AC
  Administered 2021-03-07: 14:00:00 110 mg via INTRAVENOUS
  Filled 2021-03-07: qty 11

## 2021-03-07 MED ORDER — OXALIPLATIN CHEMO INJECTION 100 MG/20ML
75.0000 mg/m2 | Freq: Once | INTRAVENOUS | Status: AC
  Administered 2021-03-07: 15:00:00 160 mg via INTRAVENOUS
  Filled 2021-03-07: qty 32

## 2021-03-07 MED ORDER — SODIUM CHLORIDE 0.9 % IV SOLN
10.0000 mg | Freq: Once | INTRAVENOUS | Status: AC
  Administered 2021-03-07: 13:00:00 10 mg via INTRAVENOUS
  Filled 2021-03-07: qty 10

## 2021-03-07 MED ORDER — SODIUM CHLORIDE 0.9 % IV SOLN
2300.0000 mg/m2 | INTRAVENOUS | Status: DC
  Administered 2021-03-07: 17:00:00 4850 mg via INTRAVENOUS
  Filled 2021-03-07: qty 97

## 2021-03-07 MED ORDER — PALONOSETRON HCL INJECTION 0.25 MG/5ML
0.2500 mg | Freq: Once | INTRAVENOUS | Status: AC
  Administered 2021-03-07: 13:00:00 0.25 mg via INTRAVENOUS
  Filled 2021-03-07: qty 5

## 2021-03-07 MED ORDER — DEXTROSE 5 % IV SOLN: Freq: Once | INTRAVENOUS | Status: AC

## 2021-03-07 NOTE — Telephone Encounter (Signed)
Patient wife called and stated that Mr. Rehfeld is still in severe pain with his bunion and wanted to know if Dr. Paulla Dolly can prescribe some medication help relieve the pain

## 2021-03-07 NOTE — Patient Instructions (Signed)
[facility]COL[facility]   Discharge Instructions: Thank you for choosing [facility] to provide your oncology and hematology care.   If you have a lab appointment with the [facility], please go directly to the [facility] and check in at the registration area.   Wear comfortable clothing and clothing appropriate for easy access to any Portacath or PICC line.   We strive to give you quality time with your provider. You may need to reschedule your appointment if you arrive late (15 or more minutes).  Arriving late affects you and other patients whose appointments are after yours.  Also, if you miss three or more appointments without notifying the office, you may be dismissed from the clinic at the providers discretion.      For prescription refill requests, have your pharmacy contact our office and allow 72 hours for refills to be completed.    Today you received the following chemotherapy and/or immunotherapy agents: docetaxel, oxaliplatin, leucovorin, fluorouracil      To help prevent nausea and vomiting after your treatment, we encourage you to take your nausea medication as directed.  BELOW ARE SYMPTOMS THAT SHOULD BE REPORTED IMMEDIATELY: *FEVER GREATER THAN 100.4 F (38 C) OR HIGHER *CHILLS OR SWEATING *NAUSEA AND VOMITING THAT IS NOT CONTROLLED WITH YOUR NAUSEA MEDICATION *UNUSUAL SHORTNESS OF BREATH *UNUSUAL BRUISING OR BLEEDING *URINARY PROBLEMS (pain or burning when urinating, or frequent urination) *BOWEL PROBLEMS (unusual diarrhea, constipation, pain near the anus) TENDERNESS IN MOUTH AND THROAT WITH OR WITHOUT PRESENCE OF ULCERS (sore throat, sores in mouth, or a toothache) UNUSUAL RASH, SWELLING OR PAIN  UNUSUAL VAGINAL DISCHARGE OR ITCHING   Items with * indicate a potential emergency and should be followed up as soon as possible or go to the Emergency Department if any problems should occur.  Please show the CHEMOTHERAPY ALERT CARD or  IMMUNOTHERAPY ALERT CARD at check-in to the Emergency Department and triage nurse.  Should you have questions after your visit or need to cancel or reschedule your appointment, please contact [facility]  Dept: [phone]  and follow the prompts.  Office hours are 8:00 a.m. to 4:30 p.m. Monday - Friday. Please note that voicemails left after 4:00 p.m. may not be returned until the following business day.  We are closed weekends and major holidays. You have access to a nurse at all times for urgent questions. Please call the main number to the clinic Dept: [phone] and follow the prompts.   For any non-urgent questions, you may also contact your provider using MyChart. We now offer e-Visits for anyone 20 and older to request care online for non-urgent symptoms. For details visit [email or web address].   Also download the MyChart app! Go to the app store, search "MyChart", open the app, select [facility], and log in with your MyChart username and password.  Due to Covid, a mask is required upon entering the hospital/clinic. If you do not have a mask, one will be given to you upon arrival. For doctor visits, patients may have 1 support person aged 30 or older with them. For treatment visits, patients cannot have anyone with them due to current Covid guidelines and our immunocompromised population.

## 2021-03-08 ENCOUNTER — Inpatient Hospital Stay: Payer: Medicare HMO

## 2021-03-08 ENCOUNTER — Other Ambulatory Visit: Payer: Self-pay

## 2021-03-08 ENCOUNTER — Other Ambulatory Visit: Payer: Self-pay | Admitting: Podiatry

## 2021-03-08 DIAGNOSIS — R197 Diarrhea, unspecified: Secondary | ICD-10-CM | POA: Diagnosis not present

## 2021-03-08 DIAGNOSIS — D509 Iron deficiency anemia, unspecified: Secondary | ICD-10-CM | POA: Diagnosis not present

## 2021-03-08 DIAGNOSIS — C169 Malignant neoplasm of stomach, unspecified: Secondary | ICD-10-CM | POA: Diagnosis not present

## 2021-03-08 DIAGNOSIS — Z79899 Other long term (current) drug therapy: Secondary | ICD-10-CM | POA: Diagnosis not present

## 2021-03-08 DIAGNOSIS — Z5189 Encounter for other specified aftercare: Secondary | ICD-10-CM | POA: Diagnosis not present

## 2021-03-08 DIAGNOSIS — C163 Malignant neoplasm of pyloric antrum: Secondary | ICD-10-CM

## 2021-03-08 DIAGNOSIS — Z5111 Encounter for antineoplastic chemotherapy: Secondary | ICD-10-CM | POA: Diagnosis not present

## 2021-03-08 MED ORDER — PEGFILGRASTIM 6 MG/0.6ML ~~LOC~~ PSKT
6.0000 mg | PREFILLED_SYRINGE | Freq: Once | SUBCUTANEOUS | Status: AC
  Administered 2021-03-08: 17:00:00 6 mg via SUBCUTANEOUS
  Filled 2021-03-08: qty 0.6

## 2021-03-08 MED ORDER — HYDROCODONE-ACETAMINOPHEN 10-325 MG PO TABS: 1.0000 | ORAL_TABLET | Freq: Three times a day (TID) | ORAL | 0 refills | Status: AC | PRN

## 2021-03-08 MED ORDER — HEPARIN SOD (PORK) LOCK FLUSH 100 UNIT/ML IV SOLN
500.0000 [IU] | Freq: Once | INTRAVENOUS | Status: AC | PRN
  Administered 2021-03-08: 17:00:00 500 [IU]

## 2021-03-08 MED ORDER — SODIUM CHLORIDE 0.9% FLUSH
10.0000 mL | INTRAVENOUS | Status: DC | PRN
  Administered 2021-03-08: 17:00:00 10 mL

## 2021-03-08 NOTE — Progress Notes (Signed)
Subjective:   Patient ID: Jon Williams, male   DOB: 71 y.o.   MRN: [id number]   HPI Patient presents with caregiver stating that this area has become more irritated around the first metatarsal head right after he had worn inappropriate shoes and created an abrasion.  States not significantly painful but they wanted it checked   ROS      Objective:  Physical Exam  No change with patient having diminished circulatory status but does have some increased darkness of his lesser digits right and has mild increase in the breakdown of the tissue around the first MPJ right with no subcutaneous exposure measuring around 1 cm length half centimeter in width with no proximal edema erythema drainage noted     Assessment:  Possibility for vascular disease which may be contributing to the condition versus a strict abrasion condition right first MPJ     Plan:  Went ahead today and I did place him in surgical shoe and advised on wet-to-dry dressings applying a dressing to the area today after flushing it and cleaning it.  I then discussed the possibility for vascular disease and I am sending him for vascular evaluation and I want to see him back after that with considerations for other treatment depending on response.  If any continued breakdown were to occur or systemic signs of infection he is to go straight to the emergency room and contact us

## 2021-03-08 NOTE — Telephone Encounter (Signed)
Called in small amount of pain medicine to take

## 2021-03-09 ENCOUNTER — Ambulatory Visit: Payer: Medicare HMO

## 2021-03-13 ENCOUNTER — Other Ambulatory Visit: Payer: Self-pay

## 2021-03-13 ENCOUNTER — Encounter: Payer: Self-pay | Admitting: Internal Medicine

## 2021-03-13 ENCOUNTER — Ambulatory Visit: Payer: Medicare HMO | Admitting: Internal Medicine

## 2021-03-13 VITALS — BP 110/70 | HR 76 | Ht 66.0 in | Wt 198.8 lb

## 2021-03-13 DIAGNOSIS — I5022 Chronic systolic (congestive) heart failure: Secondary | ICD-10-CM | POA: Diagnosis not present

## 2021-03-13 DIAGNOSIS — I4819 Other persistent atrial fibrillation: Secondary | ICD-10-CM

## 2021-03-13 DIAGNOSIS — I10 Essential (primary) hypertension: Secondary | ICD-10-CM | POA: Diagnosis not present

## 2021-03-13 NOTE — Progress Notes (Signed)
HPI Mr. Shifflet returns today for followup. He is a pleasant 71 yo man with a h/o non-ischemic CM chronic systolic heart failure and recent diagnosis of gastric CA for which he has undergone port placement and is receiving chemo therapy. He has had a h/o atrial fib. He feels well despite his rounds of chemo. He denies chest pain or sob. No ICD therapies.  Allergies  Allergen Reactions   Aspirin Anaphylaxis and Hives   Sulfa Antibiotics Anaphylaxis and Hives   Other Other (See Comments)     Current Outpatient Medications  Medication Sig Dispense Refill   atorvastatin (LIPITOR) 40 MG tablet TAKE 1 TABLET(40 MG) BY MOUTH DAILY 90 tablet 0   carvedilol (COREG) 3.125 MG tablet TAKE 1 TABLET(3.125 MG) BY MOUTH TWICE DAILY 180 tablet 0   cholecalciferol (VITAMIN D3) 25 MCG (1000 UT) tablet Take 1,000 Units by mouth daily with lunch.      dexamethasone (DECADRON) 4 MG tablet Take 2 tablets (8 mg total) by mouth daily. Take 1 tab daily starting the day after chemotherapy for 2 days. Take with food. 30 tablet 1   docusate sodium (COLACE) 100 MG capsule Take 1 capsule (100 mg total) by mouth 2 (two) times daily as needed for mild constipation. 20 capsule 0   furosemide (LASIX) 40 MG tablet Take 1 tablet (40 mg total) by mouth 2 (two) times daily. 180 tablet 3   HYDROcodone-acetaminophen (NORCO) 10-325 MG tablet Take 1 tablet by mouth every 8 (eight) hours as needed for up to 5 days. 15 tablet 0   lidocaine-prilocaine (EMLA) cream Apply to affected area once 30 g 3   metFORMIN (GLUCOPHAGE-XR) 500 MG 24 hr tablet Take 500 mg by mouth every evening.     Multiple Vitamin (MULTIVITAMIN WITH MINERALS) TABS tablet Take 1 tablet by mouth daily with lunch. One-A-Day     Naphazoline HCl (CLEAR EYES OP) Place 1 drop into both eyes daily as needed (irritation).     ondansetron (ZOFRAN) 4 MG tablet Take 1 tablet (4 mg total) by mouth every 6 (six) hours as needed for nausea. 20 tablet 0   ondansetron  (ZOFRAN) 8 MG tablet Take 1 tablet (8 mg total) by mouth 2 (two) times daily as needed for refractory nausea / vomiting. Start on day 3 after chemotherapy. 30 tablet 1   pantoprazole (PROTONIX) 40 MG tablet Take 1 tablet (40 mg total) by mouth 2 (two) times daily. 60 tablet 0   polyethylene glycol (MIRALAX / GLYCOLAX) 17 g packet Take 17 g by mouth daily as needed for moderate constipation. 14 each 0   potassium chloride (KLOR-CON) 10 MEQ tablet TAKE 1 TABLET(10 MEQ) BY MOUTH TWICE DAILY 180 tablet 3   prochlorperazine (COMPAZINE) 10 MG tablet Take 1 tablet (10 mg total) by mouth every 6 (six) hours as needed (Nausea or vomiting). 30 tablet 2   sacubitril-valsartan (ENTRESTO) 24-26 MG Take 1 tablet by mouth 2 (two) times daily. 180 tablet 3   sotalol (BETAPACE) 80 MG tablet Take 1.5 tablets (120 mg total) by mouth in the morning and at bedtime. 270 tablet 2   No current facility-administered medications for this visit.     Past Medical History:  Diagnosis Date   AICD (automatic cardioverter/defibrillator) present 05/25/2016   biv icd   Bunion, right    Chronic combined systolic and diastolic CHF (congestive heart failure) ([place])    Echo 1/18: Mild conc LVH, EF 15-20, severe diff HK, inf and inf-septal  AK, Gr 3 DD, mild to mod MR, severe LAE, mod reduced RVSF, mod RAE, mild TR, PASP 50   Coronary artery disease involving native coronary artery without angina pectoris 04/17/2016   LHC 1/18: pLCx 30, mLCx 20, mRCA 40, dRCA 20, LVEDP 23, mean RA 8, PA 42/20, PCWP 17   Diabetes mellitus without complication (HCC)    DJD (degenerative joint disease)    History of atrial fibrillation    History of atrial flutter    History of cardiomegaly 06/07/2016   Noted on CXR   History of colon polyps 06/28/2017   Noted on colonoscopy   LBBB (left bundle branch block)    NICM (nonischemic cardiomyopathy) ([place])    Echo 1/18:  Mild conc LVH, EF 15-20, severe diff HK, inf and inf-septal AK, Gr 3 DD, mild to  mod MR, severe LAE, mod reduced RVSF, mod RAE, mild TR, PASP 50   OA (osteoarthritis)    knee   Other secondary pulmonary hypertension ([place]) 04/17/2016   Prostate cancer ([place]) 2019   Sigmoid diverticulosis 06/28/2017   Noted on colonoscopy    ROS:   All systems reviewed and negative except as noted in the HPI.   Past Surgical History:  Procedure Laterality Date   BIOPSY  12/01/2020   Procedure: BIOPSY;  Surgeon: [name], MD;  Location: [facility];  Service: Endoscopy;;   BIV ICD INSERTION CRT-D N/A 05/25/2016   Procedure: BiV ICD Insertion CRT-D;  Surgeon: [name], MD;  Location: [place] [facility];  Service: Cardiovascular;  Laterality: N/A;   CARDIAC CATHETERIZATION N/A 03/02/2016   Procedure: Right/Left Heart Cath and Coronary Angiography;  Surgeon: Nelva Bush, MD;  Location: [facility];  Service: Cardiovascular;  Laterality: N/A;   CARDIOVERSION N/A 07/17/2016   Procedure: Cardioversion;  Surgeon: [name], MD;  Location: [facility];  Service: Cardiovascular;  Laterality: N/A;   COLONOSCOPY WITH PROPOFOL N/A 06/28/2017   Procedure: COLONOSCOPY WITH PROPOFOL;  Surgeon: [name], MD;  Location: [facility];  Service: Endoscopy;  Laterality: N/A;   colonscopy  2009   ESOPHAGOGASTRODUODENOSCOPY N/A 12/01/2020   Procedure: ESOPHAGOGASTRODUODENOSCOPY (EGD);  Surgeon: [name], MD;  Location: [facility];  Service: Endoscopy;  Laterality: N/A;  IDA/guaiac positive stools   ESOPHAGOGASTRODUODENOSCOPY (EGD) WITH PROPOFOL N/A 12/16/2020   Procedure: ESOPHAGOGASTRODUODENOSCOPY (EGD) WITH PROPOFOL;  Surgeon: [name], MD;  Location: [facility];  Service: Endoscopy;  Laterality: N/A;   GOLD SEED IMPLANT N/A 12/24/2017   Procedure: GOLD SEED IMPLANT, TRANSERINEAL;  Surgeon: Festus Aloe, MD;  Location: [facility];  Service: Urology;  Laterality: N/A;   INSERT / REPLACE / REMOVE PACEMAKER     LEAD REVISION  10/10/2018   LEAD  REVISION/REPAIR N/A 10/10/2018   Procedure: LEAD REVISION/REPAIR;  Surgeon: [name], MD;  Location: [facility];  Service: Cardiovascular;  Laterality: N/A;   POLYPECTOMY  06/28/2017   Procedure: POLYPECTOMY;  Surgeon: [name], MD;  Location: [facility];  Service: Endoscopy;;  ascending and descending colon polyp   PORTACATH PLACEMENT Right 12/23/2020   Procedure: INSERTION PORT-A-CATH;  Surgeon: [name], MD;  Location: [facility];  Service: General;  Laterality: Right;   PROSTATE BIOPSY  02/20/2017   SPACE OAR INSTILLATION N/A 12/24/2017   Procedure: SPACE OAR INSTILLATION;  Surgeon: Festus Aloe, MD;  Location: [facility];  Service: Urology;  Laterality: N/A;   TOTAL KNEE ARTHROPLASTY Right 08/16/2017   Procedure: RIGHT TOTAL KNEE ARTHROPLASTY;  Surgeon: [name],  MD;  Location: [facility];  Service: Orthopedics;  Laterality: Right;   UPPER ESOPHAGEAL ENDOSCOPIC ULTRASOUND (EUS) N/A 12/16/2020   Procedure: UPPER ESOPHAGEAL ENDOSCOPIC ULTRASOUND (EUS);  Surgeon: [name], MD;  Location: [facility];  Service: Endoscopy;  Laterality: N/A;     Family History  Problem Relation Age of Onset   Hypertension Mother    Heart disease Mother    Diabetes Mother    Diabetes Father    Hypertension Father    Cancer Father        lung cancer   Healthy Sister    Heart attack Brother    Heart disease Brother 46       + tobacco   Healthy Brother      Social History   Socioeconomic History   Marital status: Married    Spouse name: Not on file   Number of children: 2   Years of education: Not on file   Highest education level: Not on file  Occupational History   Not on file  Tobacco Use   Smoking status: Never   Smokeless tobacco: Never  Vaping Use   Vaping Use: Never used  Substance and Sexual Activity   Alcohol use: No   Drug use: No   Sexual activity: Not Currently  Other Topics Concern   Not on file  Social History Narrative   Retired Environmental education officer. Married to Mrs. Townsend Roger. Daughter, Beckie Busing, lives in [place]. Son, [place] Wolf, lives in [place].   Social Determinants of Health   Financial Resource Strain: Not on file  Food Insecurity: Not on file  Transportation Needs: Not on file  Physical Activity: Not on file  Stress: Not on file  Social Connections: Not on file  Intimate Partner Violence: Not on file     BP 110/70    Pulse 76    Ht 5\' 6"  (1.676 m)    Wt 198 lb 12.8 oz (90.2 kg)    SpO2 93%    BMI 32.09 kg/m   Physical Exam:  stable appearing NAD HEENT: Unremarkable Neck:  No JVD, no thyromegally Lymphatics:  No adenopathy Back:  No CVA tenderness Lungs:  Clear with no wheezes HEART:  Regular rate rhythm, no murmurs, no rubs, no clicks Abd:  soft, positive bowel sounds, no organomegally, no rebound, no guarding Ext:  2 plus pulses, no edema, no cyanosis, no clubbing Skin:  No rashes no nodules Neuro:  CN II through XII intact, motor grossly intact  EKG - atrial fib with a controlled VR and LBBB  DEVICE  Normal device function.  See PaceArt for details.   Assess/Plan:  Atrial fib - his VR is well controlled. No change in his meds.  Chronic systolic heart failure - his symptoms remain class 2. No change in meds. ICD - his St. Jude ICD is working normally. He has over 2 years of battery longevity. CAD - he denies anginal symptoms we will follow.  Carleene Overlie Hamlet Lasecki,MD

## 2021-03-13 NOTE — Patient Instructions (Addendum)
Medication Instructions:  Your physician recommends that you continue on your current medications as directed. Please refer to the Current Medication list given to you today. *If you need a refill on your cardiac medications before your next appointment, please call your pharmacy*  Lab Work: None. If you have labs (blood work) drawn today and your tests are completely normal, you will receive your results only by: [place] (if you have MyChart) OR A paper copy in the mail If you have any lab test that is abnormal or we need to change your treatment, we will call you to review the results.  Testing/Procedures: None.  Follow-Up: At [facility], you and your health needs are our priority.  As part of our continuing mission to provide you with exceptional heart care, we have created designated Provider Care Teams.  These Care Teams include your primary Cardiologist (physician) and Advanced Practice Providers (APPs -  Physician Assistants and Nurse Practitioners) who all work together to provide you with the care you need, when you need it.  Your physician wants you to follow-up in: 12 months with Cristopher Peru, MD     You will receive a reminder letter in the mail two months in advance. If you don't receive a letter, please call our office to schedule the follow-up appointment.  Remote monitoring is used to monitor your ICD from home. This monitoring reduces the number of office visits required to check your device to one time per year. It allows [place] to keep an eye on the functioning of your device to ensure it is working properly. You are scheduled for a device check from home on 04/10/21. You may send your transmission at any time that day. If you have a wireless device, the transmission will be sent automatically. After your physician reviews your transmission, you will receive a postcard with your next transmission date.  We recommend signing up for the patient portal called "MyChart".   Sign up information is provided on this After Visit Summary.  MyChart is used to connect with patients for Virtual Visits (Telemedicine).  Patients are able to view lab/test results, encounter notes, upcoming appointments, etc.  Non-urgent messages can be sent to your provider as well.   To learn more about what you can do with MyChart, go to [email or web address].    Any Other Special Instructions Will Be Listed Below (If Applicable).

## 2021-03-14 DIAGNOSIS — E119 Type 2 diabetes mellitus without complications: Secondary | ICD-10-CM | POA: Diagnosis not present

## 2021-03-14 DIAGNOSIS — G8929 Other chronic pain: Secondary | ICD-10-CM | POA: Diagnosis not present

## 2021-03-14 DIAGNOSIS — E1169 Type 2 diabetes mellitus with other specified complication: Secondary | ICD-10-CM | POA: Diagnosis not present

## 2021-03-14 DIAGNOSIS — N400 Enlarged prostate without lower urinary tract symptoms: Secondary | ICD-10-CM | POA: Diagnosis not present

## 2021-03-14 DIAGNOSIS — E7800 Pure hypercholesterolemia, unspecified: Secondary | ICD-10-CM | POA: Diagnosis not present

## 2021-03-15 ENCOUNTER — Other Ambulatory Visit: Payer: Medicare HMO

## 2021-03-15 ENCOUNTER — Ambulatory Visit: Payer: Medicare HMO | Admitting: Hematology

## 2021-03-15 ENCOUNTER — Ambulatory Visit: Payer: Medicare HMO

## 2021-03-16 ENCOUNTER — Telehealth: Payer: Self-pay | Admitting: Internal Medicine

## 2021-03-16 ENCOUNTER — Ambulatory Visit: Payer: Medicare HMO

## 2021-03-16 ENCOUNTER — Other Ambulatory Visit: Payer: Self-pay | Admitting: Cardiovascular Disease

## 2021-03-16 NOTE — Telephone Encounter (Signed)
°*  STAT* If patient is at the pharmacy, call can be transferred to refill team.   1. Which medications need to be refilled? (please list name of each medication and dose if known) metFORMIN (GLUCOPHAGE-XR) 500 MG 24 hr tablet  2. Which pharmacy/location (including street and city if local pharmacy) is medication to be sent to?[facility] [place], [address]  3. Do they need a 30 day or 90 day supply? 90 ds

## 2021-03-16 NOTE — Telephone Encounter (Signed)
Returned call to pt, he has been made aware that he will need to contact his PCP for refill of Metformin. Pt was appreciative of the call back.

## 2021-03-21 MED FILL — Dexamethasone Sodium Phosphate Inj 100 MG/10ML: INTRAMUSCULAR | Qty: 1 | Status: AC

## 2021-03-22 ENCOUNTER — Inpatient Hospital Stay: Payer: Medicare HMO | Admitting: Hematology

## 2021-03-22 ENCOUNTER — Encounter: Payer: Self-pay | Admitting: Hematology

## 2021-03-22 ENCOUNTER — Other Ambulatory Visit: Payer: Self-pay

## 2021-03-22 ENCOUNTER — Inpatient Hospital Stay: Payer: Medicare HMO | Attending: Hematology

## 2021-03-22 ENCOUNTER — Telehealth: Payer: Self-pay

## 2021-03-22 ENCOUNTER — Inpatient Hospital Stay: Payer: Medicare HMO

## 2021-03-22 VITALS — BP 113/96 | HR 65 | Temp 97.9°F | Resp 18 | Wt 193.1 lb

## 2021-03-22 DIAGNOSIS — Z5111 Encounter for antineoplastic chemotherapy: Secondary | ICD-10-CM | POA: Diagnosis not present

## 2021-03-22 DIAGNOSIS — D509 Iron deficiency anemia, unspecified: Secondary | ICD-10-CM | POA: Insufficient documentation

## 2021-03-22 DIAGNOSIS — C163 Malignant neoplasm of pyloric antrum: Secondary | ICD-10-CM | POA: Diagnosis not present

## 2021-03-22 DIAGNOSIS — C169 Malignant neoplasm of stomach, unspecified: Secondary | ICD-10-CM | POA: Diagnosis not present

## 2021-03-22 DIAGNOSIS — Z5189 Encounter for other specified aftercare: Secondary | ICD-10-CM | POA: Insufficient documentation

## 2021-03-22 DIAGNOSIS — Z95828 Presence of other vascular implants and grafts: Secondary | ICD-10-CM

## 2021-03-22 LAB — CBC WITH DIFFERENTIAL/PLATELET
Abs Immature Granulocytes: 0.16 10*3/uL — ABNORMAL HIGH (ref 0.00–0.07)
Basophils Absolute: 0 10*3/uL (ref 0.0–0.1)
Basophils Relative: 0 %
Eosinophils Absolute: 0 10*3/uL (ref 0.0–0.5)
Eosinophils Relative: 0 %
HCT: 35.1 % — ABNORMAL LOW (ref 39.0–52.0)
Hemoglobin: 11.8 g/dL — ABNORMAL LOW (ref 13.0–17.0)
Immature Granulocytes: 1 %
Lymphocytes Relative: 6 %
Lymphs Abs: 0.8 10*3/uL (ref 0.7–4.0)
MCH: 30.4 pg (ref 26.0–34.0)
MCHC: 33.6 g/dL (ref 30.0–36.0)
MCV: 90.5 fL (ref 80.0–100.0)
Monocytes Absolute: 0.9 10*3/uL (ref 0.1–1.0)
Monocytes Relative: 6 %
Neutro Abs: 12.3 10*3/uL — ABNORMAL HIGH (ref 1.7–7.7)
Neutrophils Relative %: 87 %
Platelets: 114 10*3/uL — ABNORMAL LOW (ref 150–400)
RBC: 3.88 MIL/uL — ABNORMAL LOW (ref 4.22–5.81)
RDW: 24.2 % — ABNORMAL HIGH (ref 11.5–15.5)
WBC: 14.2 10*3/uL — ABNORMAL HIGH (ref 4.0–10.5)
nRBC: 0 % (ref 0.0–0.2)

## 2021-03-22 LAB — COMPREHENSIVE METABOLIC PANEL
ALT: 22 U/L (ref 0–44)
AST: 34 U/L (ref 15–41)
Albumin: 4 g/dL (ref 3.5–5.0)
Alkaline Phosphatase: 49 U/L (ref 38–126)
Anion gap: 9 (ref 5–15)
BUN: 13 mg/dL (ref 8–23)
CO2: 24 mmol/L (ref 22–32)
Calcium: 9 mg/dL (ref 8.9–10.3)
Chloride: 101 mmol/L (ref 98–111)
Creatinine, Ser: 1.33 mg/dL — ABNORMAL HIGH (ref 0.61–1.24)
GFR, Estimated: 57 mL/min — ABNORMAL LOW (ref >60)
Glucose, Bld: 105 mg/dL — ABNORMAL HIGH (ref 70–99)
Potassium: 4.1 mmol/L (ref 3.5–5.1)
Sodium: 134 mmol/L — ABNORMAL LOW (ref 135–145)
Total Bilirubin: 0.8 mg/dL (ref 0.3–1.2)
Total Protein: 6.5 g/dL (ref 6.5–8.1)

## 2021-03-22 MED ORDER — DEXTROSE 5 % IV SOLN: Freq: Once | INTRAVENOUS | Status: AC

## 2021-03-22 MED ORDER — PALONOSETRON HCL INJECTION 0.25 MG/5ML
0.2500 mg | Freq: Once | INTRAVENOUS | Status: AC
  Administered 2021-03-22: 0.25 mg via INTRAVENOUS
  Filled 2021-03-22: qty 5

## 2021-03-22 MED ORDER — LEUCOVORIN CALCIUM INJECTION 350 MG
200.0000 mg/m2 | Freq: Once | INTRAVENOUS | Status: AC
  Administered 2021-03-22: 420 mg via INTRAVENOUS
  Filled 2021-03-22: qty 21

## 2021-03-22 MED ORDER — OXALIPLATIN CHEMO INJECTION 100 MG/20ML
75.0000 mg/m2 | Freq: Once | INTRAVENOUS | Status: AC
  Administered 2021-03-22: 160 mg via INTRAVENOUS
  Filled 2021-03-22: qty 32

## 2021-03-22 MED ORDER — SODIUM CHLORIDE 0.9 % IV SOLN
50.0000 mg/m2 | Freq: Once | INTRAVENOUS | Status: AC
  Administered 2021-03-22: 110 mg via INTRAVENOUS
  Filled 2021-03-22: qty 11

## 2021-03-22 MED ORDER — SODIUM CHLORIDE 0.9 % IV SOLN
10.0000 mg | Freq: Once | INTRAVENOUS | Status: AC
  Administered 2021-03-22: 10 mg via INTRAVENOUS
  Filled 2021-03-22: qty 10

## 2021-03-22 MED ORDER — SODIUM CHLORIDE 0.9% FLUSH
10.0000 mL | INTRAVENOUS | Status: AC | PRN
  Administered 2021-03-22: 10 mL

## 2021-03-22 MED ORDER — HEPARIN SOD (PORK) LOCK FLUSH 100 UNIT/ML IV SOLN: 500.0000 [IU] | Freq: Once | INTRAVENOUS | Status: DC | PRN

## 2021-03-22 MED ORDER — SODIUM CHLORIDE 0.9% FLUSH: 10.0000 mL | INTRAVENOUS | Status: DC | PRN

## 2021-03-22 MED ORDER — SODIUM CHLORIDE 0.9 % IV SOLN
5000.0000 mg | INTRAVENOUS | Status: DC
  Administered 2021-03-22: 5000 mg via INTRAVENOUS
  Filled 2021-03-22: qty 100

## 2021-03-22 NOTE — Progress Notes (Signed)
Pt feeling slightly unsteady on his feet at time of discharge. VSS. Pt made Burr Medico MD aware of ongoing unsteadiness and foot pain during appt today. Pt stated he had no other complaints. Pt transported outside in wheelchair and helped to car by staff.

## 2021-03-22 NOTE — Telephone Encounter (Signed)
Patients wife called the office stating that Mr.Maltese takes hydrocodone 15 tablets , Patient wife states he needs a higher dose , states Mr.Piloto is out of the hydrocodone and is in a lot pain and needs a refill.    Please advise.Marland KitchenMarland KitchenMarland Kitchen

## 2021-03-22 NOTE — Progress Notes (Signed)
[place]   [phone] Fax:([phone]   Clinic Follow up Note   Patient Care Team: [name], MD as PCP - General (Internal Medicine) [name], [name], MD as PCP - Cardiology (Cardiology) [name], MD as PCP - Electrophysiology (Cardiology) [name], MD as Consulting Physician (General Surgery) Truitt Merle, MD as Consulting Physician (Hematology)  Date of Service:  03/22/2021  CHIEF COMPLAINT: f/u of gastric cancer  CURRENT THERAPY:  Neoadjuvant FLOT4, every 2 weeks, started 01/02/21  ASSESSMENT & PLAN:  Jon Williams is a 71 y.o. male with   1. Gastric Cancer, cT3N0M0, stage II -initially referred to ED by PCP on 11/30/20 for severe anemia hgb 7.4. He was also recently treated for H-pylori gastritis. EGD on 12/01/20 by Dr. Benson Norway showed a tumor at the incisura, which pathology confirmed was adenocarcinoma.  -CT CAP 12/01/20 showed no metastatic disease. -He met with Dr. Zenia Resides on 12/05/20. -He underwent echo on 10/26 showing reduced EF and severe LV dysfunction. Per Dr. Lovena Le, surgery is recommended only if it is curative given his moderate risk. -He proceeded to EUS on 12/16/20 under Dr. Benson Norway, staging T3 N0. -IHC returned abnormal, showing loss of expression in MLH1 and PMS2. Which predict good response to immunotherapy  -port placed 12/23/20 -he started FLOT4 with dose reduction on 01/02/21, increased to full dose and he overall tolerated well  -he has an open wound on his right foot still; the slow healing is secondary to chemo.Labs reviewed, adequate to proceed with treatment. We will plan for restaging scan prior to his final cycle in 4 weeks.   2.  Right foot deformity, wound  -He developed worsening pain and tightness in the right foot, went to podiatry 02/24/21 -X-ray, per MD note, showed significant structural bunion deformity with no osteolysis or soft tissue budding  -He developed an abrasion with open skin at the right  metatarsal head, and was placed on prophylactic doxycycline which he has completed  -the wound has continued discharge and delayed healing. He will see Dr. Paulla Dolly on 03/27/21   3.  CIPN, G1 -He developed mild intermittent numbness and tingling after cycle 4 FLOT, secondary to oxaliplatin and taxotere -I recommend to start B complex vitamin   4. H/o Prostate Cancer  -diagnosed with Gleason 4+4 prostate cancer in early 2019. He was treated with long-term ADT in combination with 8 weeks of IMRT.   5. Diabetes, atrial fibrillation, severe cardiomyopathy with EF 20 to 25%, status post ICD placement -Follow-up with PCP and cardiology -no signs of fluid overload thus far, continue monitoring closely   6. Iron deficient anemia -Secondary to gastric cancer -received 2 doses of IV Venofer, last 12/20/20 -hg stable      PLAN: -will proceed to cycle 6 chemo today at same dose -pump d/c and GCSF tomorrow  -lab, flush, f/u, and FLOT4 in 2 and 4 weeks as scheduled, may need to decrease oxali dose if neuropathy gets worse  -plan for restaging scan in 3-4 weeks, prior to final cycle chemo, ordered    No problem-specific Assessment & Plan notes found for this encounter.   SUMMARY OF ONCOLOGIC HISTORY: Oncology History Overview Note  Cancer Staging Gastric cancer [facility]) Staging form: Stomach, AJCC 8th Edition - Clinical stage from 12/01/2020: Stage IIB (cT3, cN0, cM0) - Signed by Truitt Merle, MD on 12/20/2020 Stage prefix: Initial diagnosis Total positive nodes: 0  Malignant neoplasm of prostate [facility]) Staging form: Prostate, AJCC 8th Edition - Clinical:  Stage IIC (cT2b, cN0, cM0, PSA: 6.7, Grade Group: 4) - Unsigned Prostate specific antigen (PSA) range: Less than 10 Gleason score: 8 Histologic grading system: 5 grade system    Gastric cancer ([place])  12/01/2020 Procedure   Upper Endoscopy, Dr. Benson Norway  Impression: - Normal esophagus. 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 Biopsied. -  Normal examined duodenum.   12/01/2020 Pathology Results   FINAL MICROSCOPIC DIAGNOSIS:   A. INCISURA, BIOPSY:  - Adenocarcinoma, moderate to poorly differentiated arising in a  background of chronic gastritis with intestinal metaplasia.    12/01/2020 Imaging   CT CAP  IMPRESSION: Focal wall thickening along the posterior aspect of the gastric antrum, likely corresponding to the patient's newly diagnosed gastric cancer.   No findings suspicious for metastatic disease.   Mild multifocal pneumonia, lower lobe predominant, likely on the basis of aspiration. Trace right pleural effusion.   Fiducial markers along the prostate in this patient with known prostate cancer.   12/01/2020 Cancer Staging   Staging form: Stomach, AJCC 8th Edition - Clinical stage from 12/01/2020: Stage IIB (cT3, cN0, cM0) - Signed by Truitt Merle, MD on 12/20/2020 Stage prefix: Initial diagnosis Total positive nodes: 0    12/08/2020 Initial Diagnosis   Gastric cancer ([place])   12/16/2020 Procedure   EUS  - Wall thickening was seen in the lesser curve of the stomach and in the antrum of the stomach. The thickening appeared to be primarily within the serosa (Layer 5). T3 N0 Mx. - There was no sign of significant pathology in the entire pancreas. - There was no sign of significant pathology in the common bile duct and in the gallbladder. - There was no evidence of significant pathology in the left lobe of the liver. - Endosonographic images of the left adrenal gland were unremarkable. - The celiac trunk and superior mesenteric artery were endosonographically normal. - The mediastinum was unremarkable endosonographically. - No specimens collected.   01/02/2021 -  Chemotherapy   Patient is on Treatment Plan : GASTROESOPHAGEAL FLOT q14d X 4 cycles        INTERVAL HISTORY:  Jon Williams is here for a follow up of gastric cancer. He was last seen by me on 03/07/21. He was seen in the infusion area. He  reports concern about his right foot wound. He notes there is discharge, and he feels it is not healing like it's supposed to. He endorses cleaning it as he's supposed to.   All other systems were reviewed with the patient and are negative.  MEDICAL HISTORY:  Past Medical History:  Diagnosis Date   AICD (automatic cardioverter/defibrillator) present 05/25/2016   biv icd   Bunion, right    Chronic combined systolic and diastolic CHF (congestive heart failure) ([place])    Echo 1/18: Mild conc LVH, EF 15-20, severe diff HK, inf and inf-septal AK, Gr 3 DD, mild to mod MR, severe LAE, mod reduced RVSF, mod RAE, mild TR, PASP 50   Coronary artery disease involving native coronary artery without angina pectoris 04/17/2016   LHC 1/18: pLCx 62, mLCx 20, mRCA 40, dRCA 20, LVEDP 23, mean RA 8, PA 42/20, PCWP 17   Diabetes mellitus without complication (HCC)    DJD (degenerative joint disease)    History of atrial fibrillation    History of atrial flutter    History of cardiomegaly 06/07/2016   Noted on CXR   History of colon polyps 06/28/2017   Noted on colonoscopy   LBBB (left bundle branch  block)    NICM (nonischemic cardiomyopathy) ([place])    Echo 1/18:  Mild conc LVH, EF 15-20, severe diff HK, inf and inf-septal AK, Gr 3 DD, mild to mod MR, severe LAE, mod reduced RVSF, mod RAE, mild TR, PASP 50   OA (osteoarthritis)    knee   Other secondary pulmonary hypertension ([place]) 04/17/2016   Prostate cancer ([place]) 2019   Sigmoid diverticulosis 06/28/2017   Noted on colonoscopy    SURGICAL HISTORY: Past Surgical History:  Procedure Laterality Date   BIOPSY  12/01/2020   Procedure: BIOPSY;  Surgeon: [name], MD;  Location: [place];  Service: Endoscopy;;   BIV ICD INSERTION CRT-D N/A 05/25/2016   Procedure: BiV ICD Insertion CRT-D;  Surgeon: [name], MD;  Location: [place] CV LAB;  Service: Cardiovascular;  Laterality: N/A;   CARDIAC CATHETERIZATION N/A 03/02/2016   Procedure:  Right/Left Heart Cath and Coronary Angiography;  Surgeon: Nelva Bush, MD;  Location: [place] CV LAB;  Service: Cardiovascular;  Laterality: N/A;   CARDIOVERSION N/A 07/17/2016   Procedure: Cardioversion;  Surgeon: [name], MD;  Location: [place] CV LAB;  Service: Cardiovascular;  Laterality: N/A;   COLONOSCOPY WITH PROPOFOL N/A 06/28/2017   Procedure: COLONOSCOPY WITH PROPOFOL;  Surgeon: [name], MD;  Location: [facility];  Service: Endoscopy;  Laterality: N/A;   colonscopy  2009   ESOPHAGOGASTRODUODENOSCOPY N/A 12/01/2020   Procedure: ESOPHAGOGASTRODUODENOSCOPY (EGD);  Surgeon: [name], MD;  Location: [place];  Service: Endoscopy;  Laterality: N/A;  IDA/guaiac positive stools   ESOPHAGOGASTRODUODENOSCOPY (EGD) WITH PROPOFOL N/A 12/16/2020   Procedure: ESOPHAGOGASTRODUODENOSCOPY (EGD) WITH PROPOFOL;  Surgeon: [name], MD;  [facility];  Service: Endoscopy;  Laterality: N/A;   GOLD SEED IMPLANT N/A 12/24/2017   Procedure: GOLD SEED IMPLANT, TRANSERINEAL;  Surgeon: Festus Aloe, MD;  [facility]: [facility];  Service: Urology;  Laterality: N/A;   INSERT / REPLACE / REMOVE PACEMAKER     LEAD REVISION  10/10/2018   LEAD REVISION/REPAIR N/A 10/10/2018   Procedure: LEAD REVISION/REPAIR;  Surgeon: [name], MD;  Location: [place] CV LAB;  Service: Cardiovascular;  Laterality: N/A;   POLYPECTOMY  06/28/2017   Procedure: POLYPECTOMY;  Surgeon: [name], MD;  [facility];  Service: Endoscopy;;  ascending and descending colon polyp   PORTACATH PLACEMENT Right 12/23/2020   Procedure: INSERTION PORT-A-CATH;  Surgeon: [name], MD;  Location: [place];  Service: General;  Laterality: Right;   PROSTATE BIOPSY  02/20/2017   SPACE OAR INSTILLATION N/A 12/24/2017   Procedure: SPACE OAR INSTILLATION;  Surgeon: Festus Aloe, MD;  [facility];  Service: Urology;  Laterality: N/A;   TOTAL KNEE ARTHROPLASTY Right 08/16/2017    Procedure: RIGHT TOTAL KNEE ARTHROPLASTY;  Surgeon: [name], MD;  Location: [place];  Service: Orthopedics;  Laterality: Right;   UPPER ESOPHAGEAL ENDOSCOPIC ULTRASOUND (EUS) N/A 12/16/2020   Procedure: UPPER ESOPHAGEAL ENDOSCOPIC ULTRASOUND (EUS);  Surgeon: [name], MD;  Location: [facility];  Service: Endoscopy;  Laterality: N/A;    I have reviewed the social history and family history with the patient and they are unchanged from previous note.  ALLERGIES:  is allergic to aspirin, sulfa antibiotics, and other.  MEDICATIONS:  Current Outpatient Medications  Medication Sig Dispense Refill   atorvastatin (LIPITOR) 40 MG tablet TAKE 1 TABLET(40 MG) BY MOUTH DAILY 90 tablet 0   carvedilol (COREG) 3.125 MG tablet TAKE 1 TABLET(3.125 MG) BY MOUTH TWICE DAILY 180 tablet 0   cholecalciferol (  VITAMIN D3) 25 MCG (1000 UT) tablet Take 1,000 Units by mouth daily with lunch.      dexamethasone (DECADRON) 4 MG tablet Take 2 tablets (8 mg total) by mouth daily. Take 1 tab daily starting the day after chemotherapy for 2 days. Take with food. 30 tablet 1   docusate sodium (COLACE) 100 MG capsule Take 1 capsule (100 mg total) by mouth 2 (two) times daily as needed for mild constipation. 20 capsule 0   furosemide (LASIX) 40 MG tablet TAKE 1 TABLET(40 MG) BY MOUTH TWICE DAILY 180 tablet 3   lidocaine-prilocaine (EMLA) cream Apply to affected area once 30 g 3   metFORMIN (GLUCOPHAGE-XR) 500 MG 24 hr tablet Take 500 mg by mouth every evening.     Multiple Vitamin (MULTIVITAMIN WITH MINERALS) TABS tablet Take 1 tablet by mouth daily with lunch. One-A-Day     Naphazoline HCl (CLEAR EYES OP) Place 1 drop into both eyes daily as needed (irritation).     ondansetron (ZOFRAN) 4 MG tablet Take 1 tablet (4 mg total) by mouth every 6 (six) hours as needed for nausea. 20 tablet 0   ondansetron (ZOFRAN) 8 MG tablet Take 1 tablet (8 mg total) by mouth 2 (two) times daily as needed for refractory nausea /  vomiting. Start on day 3 after chemotherapy. 30 tablet 1   pantoprazole (PROTONIX) 40 MG tablet Take 1 tablet (40 mg total) by mouth 2 (two) times daily. 60 tablet 0   polyethylene glycol (MIRALAX / GLYCOLAX) 17 g packet Take 17 g by mouth daily as needed for moderate constipation. 14 each 0   potassium chloride (KLOR-CON) 10 MEQ tablet TAKE 1 TABLET(10 MEQ) BY MOUTH TWICE DAILY 180 tablet 3   prochlorperazine (COMPAZINE) 10 MG tablet Take 1 tablet (10 mg total) by mouth every 6 (six) hours as needed (Nausea or vomiting). 30 tablet 2   sacubitril-valsartan (ENTRESTO) 24-26 MG Take 1 tablet by mouth 2 (two) times daily. 180 tablet 3   sotalol (BETAPACE) 80 MG tablet Take 1.5 tablets (120 mg total) by mouth in the morning and at bedtime. 270 tablet 2   No current facility-administered medications for this visit.   Facility-Administered Medications Ordered in Other Visits  Medication Dose Route Frequency Provider Last Rate Last Admin   fluorouracil (ADRUCIL) 5,000 mg in sodium chloride 0.9 % 150 mL chemo infusion  5,000 mg Intravenous 1 day or 1 dose Truitt Merle, MD   5,000 mg at 03/22/21 1515   heparin lock flush 100 unit/mL  500 Units Intracatheter Once PRN Truitt Merle, MD       sodium chloride flush (NS) 0.9 % injection 10 mL  10 mL Intracatheter PRN Truitt Merle, MD        PHYSICAL EXAMINATION: ECOG PERFORMANCE STATUS: 1 - Symptomatic but completely ambulatory  There were no vitals filed for this visit. Wt Readings from Last 3 Encounters:  03/22/21 193 lb 1.9 oz (87.6 kg)  03/13/21 198 lb 12.8 oz (90.2 kg)  03/07/21 199 lb 14.4 oz (90.7 kg)     GENERAL:alert, no distress and comfortable SKIN: skin color normal, no rashes or significant lesions EYES: normal, Conjunctiva are pink and non-injected, sclera clear  NEURO: alert & oriented x 3 with fluent speech  LABORATORY DATA:  I have reviewed the data as listed CBC Latest Ref Rng & Units 03/22/2021 03/07/2021 02/28/2021  WBC 4.0 - 10.5 K/uL  14.2(H) 8.4 12.7(H)  Hemoglobin 13.0 - 17.0 g/dL 11.8(L) 11.3(L) 12.4(L)  Hematocrit 39.0 -  52.0 % 35.1(L) 34.7(L) 38.0(L)  Platelets 150 - 400 K/uL 114(L) 193 104(L)     CMP Latest Ref Rng & Units 03/22/2021 03/07/2021 02/28/2021  Glucose 70 - 99 mg/dL 105(H) 126(H) 120(H)  BUN 8 - 23 mg/dL _0 Creatinine 0.61 - 1.24 mg/dL 1.33(H) 1.18 1.46(H)  Sodium 135 - 145 mmol/L 134(L) 133(L) 138  Potassium 3.5 - 5.1 mmol/L 4.1 3.8 3.5  Chloride 98 - 111 mmol/L 101 100 104  CO2 22 - 32 mmol/L _1 Calcium 8.9 - 10.3 mg/dL 9.0 9.2 9.1  Total Protein 6.5 - 8.1 g/dL 6.5 6.2(L) 6.8  Total Bilirubin 0.3 - 1.2 mg/dL 0.8 0.7 0.8  Alkaline Phos 38 - 126 U/L 49 51 61  AST 15 - 41 U/L 34 27 29  ALT 0 - 44 U/L _2 RADIOGRAPHIC STUDIES: I have personally reviewed the radiological images as listed and agreed with the findings in the report. No results found.    Orders Placed This Encounter  Procedures   CT CHEST ABDOMEN PELVIS W CONTRAST    Hold iv contrast if EGFR<50    Standing Status:   Future    Standing Expiration Date:   03/22/2022    Order Specific Question:   Preferred imaging location?    Answer:   [facility]    Order Specific Question:   Is Oral Contrast requested for this exam?    Answer:   Yes, Per Radiology protocol   All questions were answered. The patient knows to call the clinic with any problems, questions or concerns. No barriers to learning was detected. The total time spent in the appointment was 30 minutes.     Truitt Merle, MD 03/22/2021   I, Wilburn Mylar, am acting as scribe for Truitt Merle, MD.   I have reviewed the above documentation for accuracy and completeness, and I agree with the above.

## 2021-03-22 NOTE — Patient Instructions (Signed)
[facility]  Discharge Instructions: Thank you for choosing [facility] to provide your oncology and hematology care.   If you have a lab appointment with the [facility], please go directly to the [facility] and check in at the registration area.   Wear comfortable clothing and clothing appropriate for easy access to any Portacath or PICC line.   We strive to give you quality time with your provider. You may need to reschedule your appointment if you arrive late (15 or more minutes).  Arriving late affects you and other patients whose appointments are after yours.  Also, if you miss three or more appointments without notifying the office, you may be dismissed from the clinic at the providers discretion.      For prescription refill requests, have your pharmacy contact our office and allow 72 hours for refills to be completed.    Today you received the following chemotherapy and/or immunotherapy agents: Docetaxel, Oxaliplatin, Fluorouracil.       To help prevent nausea and vomiting after your treatment, we encourage you to take your nausea medication as directed.  BELOW ARE SYMPTOMS THAT SHOULD BE REPORTED IMMEDIATELY: *FEVER GREATER THAN 100.4 F (38 C) OR HIGHER *CHILLS OR SWEATING *NAUSEA AND VOMITING THAT IS NOT CONTROLLED WITH YOUR NAUSEA MEDICATION *UNUSUAL SHORTNESS OF BREATH *UNUSUAL BRUISING OR BLEEDING *URINARY PROBLEMS (pain or burning when urinating, or frequent urination) *BOWEL PROBLEMS (unusual diarrhea, constipation, pain near the anus) TENDERNESS IN MOUTH AND THROAT WITH OR WITHOUT PRESENCE OF ULCERS (sore throat, sores in mouth, or a toothache) UNUSUAL RASH, SWELLING OR PAIN  UNUSUAL VAGINAL DISCHARGE OR ITCHING   Items with * indicate a potential emergency and should be followed up as soon as possible or go to the Emergency Department if any problems should occur.  Please show the CHEMOTHERAPY ALERT CARD or  IMMUNOTHERAPY ALERT CARD at check-in to the Emergency Department and triage nurse.  Should you have questions after your visit or need to cancel or reschedule your appointment, please contact [facility]  Dept: [phone]  and follow the prompts.  Office hours are 8:00 a.m. to 4:30 p.m. Monday - Friday. Please note that voicemails left after 4:00 p.m. may not be returned until the following business day.  We are closed weekends and major holidays. You have access to a nurse at all times for urgent questions. Please call the main number to the clinic Dept: [phone] and follow the prompts.   For any non-urgent questions, you may also contact your provider using MyChart. We now offer e-Visits for anyone 73 and older to request care online for non-urgent symptoms. For details visit [email or web address].   Also download the MyChart app! Go to the app store, search "MyChart", open the app, select [facility], and log in with your MyChart username and password.  Due to Covid, a mask is required upon entering the hospital/clinic. If you do not have a mask, one will be given to you upon arrival. For doctor visits, patients may have 1 support person aged 73 or older with them. For treatment visits, patients cannot have anyone with them due to current Covid guidelines and our immunocompromised population.

## 2021-03-22 NOTE — Telephone Encounter (Signed)
We do not write for long term pain control

## 2021-03-23 ENCOUNTER — Inpatient Hospital Stay: Payer: Medicare HMO

## 2021-03-23 VITALS — BP 120/79 | HR 59 | Temp 97.7°F | Resp 18

## 2021-03-23 DIAGNOSIS — D509 Iron deficiency anemia, unspecified: Secondary | ICD-10-CM | POA: Diagnosis not present

## 2021-03-23 DIAGNOSIS — Z5189 Encounter for other specified aftercare: Secondary | ICD-10-CM | POA: Diagnosis not present

## 2021-03-23 DIAGNOSIS — C169 Malignant neoplasm of stomach, unspecified: Secondary | ICD-10-CM | POA: Diagnosis not present

## 2021-03-23 DIAGNOSIS — C163 Malignant neoplasm of pyloric antrum: Secondary | ICD-10-CM

## 2021-03-23 DIAGNOSIS — Z5111 Encounter for antineoplastic chemotherapy: Secondary | ICD-10-CM | POA: Diagnosis not present

## 2021-03-23 MED ORDER — PEGFILGRASTIM 6 MG/0.6ML ~~LOC~~ PSKT
6.0000 mg | PREFILLED_SYRINGE | Freq: Once | SUBCUTANEOUS | Status: AC
  Administered 2021-03-23: 6 mg via SUBCUTANEOUS
  Filled 2021-03-23: qty 0.6

## 2021-03-23 NOTE — Telephone Encounter (Signed)
I tried to call the patient this morning to inform him , unfortunately he has a voicemail box that is not set up , will try back at a later time.

## 2021-03-27 ENCOUNTER — Other Ambulatory Visit: Payer: Self-pay

## 2021-03-27 ENCOUNTER — Ambulatory Visit: Payer: Medicare HMO | Admitting: Podiatry

## 2021-03-27 ENCOUNTER — Encounter: Payer: Self-pay | Admitting: Podiatry

## 2021-03-27 ENCOUNTER — Other Ambulatory Visit: Payer: Self-pay | Admitting: Podiatry

## 2021-03-27 DIAGNOSIS — I739 Peripheral vascular disease, unspecified: Secondary | ICD-10-CM | POA: Diagnosis not present

## 2021-03-27 DIAGNOSIS — D689 Coagulation defect, unspecified: Secondary | ICD-10-CM

## 2021-03-28 ENCOUNTER — Ambulatory Visit (HOSPITAL_COMMUNITY)
Admission: RE | Admit: 2021-03-28 | Discharge: 2021-03-28 | Disposition: A | Discharge status: Home / Self Care | Payer: Medicare HMO | Source: Ambulatory Visit | Attending: Cardiology | Admitting: Cardiology

## 2021-03-28 DIAGNOSIS — I739 Peripheral vascular disease, unspecified: Secondary | ICD-10-CM | POA: Insufficient documentation

## 2021-03-28 NOTE — Progress Notes (Signed)
Subjective:   Patient ID: Jon Williams, male   DOB: 71 y.o.   MRN: [id number]   HPI Patient is been wearing surgical shoe states it does not seem to be getting worse but not healing   ROS      Objective:  Physical Exam  Neurovascular status unchanged with patient due to have vascular studies in 24 hours with discoloration of the foot right and breakdown around the first metatarsal head right measuring about 1.5 1.5 cm with subcutaneous exposure no erythema edema surrounding     Assessment:  Continuation of breakdown of tissue around the first metatarsal head right with not progressing but not improving     Plan:  H&P x-rays reviewed and I discussed what may be done depending on results of vascular studies reviewed with caregiver also

## 2021-03-29 ENCOUNTER — Other Ambulatory Visit: Payer: Medicare HMO

## 2021-03-29 ENCOUNTER — Ambulatory Visit: Payer: Medicare HMO | Admitting: Nurse Practitioner

## 2021-03-29 ENCOUNTER — Ambulatory Visit: Payer: Medicare HMO

## 2021-03-30 ENCOUNTER — Ambulatory Visit: Payer: Medicare HMO

## 2021-03-31 ENCOUNTER — Ambulatory Visit: Payer: Medicare HMO | Admitting: Podiatry

## 2021-03-31 ENCOUNTER — Telehealth: Payer: Self-pay | Admitting: *Deleted

## 2021-03-31 NOTE — Telephone Encounter (Signed)
Patient is calling for results from vein and vascular(03/29/21), please advise.

## 2021-04-01 ENCOUNTER — Emergency Department (HOSPITAL_COMMUNITY): Payer: Medicare HMO

## 2021-04-01 ENCOUNTER — Encounter (HOSPITAL_COMMUNITY): Payer: Self-pay

## 2021-04-01 ENCOUNTER — Other Ambulatory Visit: Payer: Self-pay

## 2021-04-01 ENCOUNTER — Inpatient Hospital Stay (HOSPITAL_COMMUNITY)
Admission: EM | Admit: 2021-04-01 | Discharge: 2021-04-06 | DRG: 682 | Disposition: A | Discharge status: Home Health | Payer: Medicare HMO | Attending: Internal Medicine | Admitting: Internal Medicine

## 2021-04-01 DIAGNOSIS — T451X5A Adverse effect of antineoplastic and immunosuppressive drugs, initial encounter: Secondary | ICD-10-CM | POA: Diagnosis present

## 2021-04-01 DIAGNOSIS — R197 Diarrhea, unspecified: Secondary | ICD-10-CM | POA: Diagnosis not present

## 2021-04-01 DIAGNOSIS — E876 Hypokalemia: Secondary | ICD-10-CM | POA: Diagnosis not present

## 2021-04-01 DIAGNOSIS — C169 Malignant neoplasm of stomach, unspecified: Secondary | ICD-10-CM | POA: Diagnosis not present

## 2021-04-01 DIAGNOSIS — I4891 Unspecified atrial fibrillation: Secondary | ICD-10-CM | POA: Diagnosis not present

## 2021-04-01 DIAGNOSIS — E860 Dehydration: Secondary | ICD-10-CM | POA: Diagnosis not present

## 2021-04-01 DIAGNOSIS — R112 Nausea with vomiting, unspecified: Secondary | ICD-10-CM | POA: Diagnosis not present

## 2021-04-01 DIAGNOSIS — Z7984 Long term (current) use of oral hypoglycemic drugs: Secondary | ICD-10-CM

## 2021-04-01 DIAGNOSIS — D72829 Elevated white blood cell count, unspecified: Secondary | ICD-10-CM

## 2021-04-01 DIAGNOSIS — R1111 Vomiting without nausea: Secondary | ICD-10-CM | POA: Diagnosis not present

## 2021-04-01 DIAGNOSIS — I130 Hypertensive heart and chronic kidney disease with heart failure and stage 1 through stage 4 chronic kidney disease, or unspecified chronic kidney disease: Secondary | ICD-10-CM | POA: Diagnosis present

## 2021-04-01 DIAGNOSIS — K2970 Gastritis, unspecified, without bleeding: Secondary | ICD-10-CM | POA: Diagnosis present

## 2021-04-01 DIAGNOSIS — R45851 Suicidal ideations: Secondary | ICD-10-CM | POA: Diagnosis present

## 2021-04-01 DIAGNOSIS — E872 Acidosis, unspecified: Secondary | ICD-10-CM | POA: Diagnosis not present

## 2021-04-01 DIAGNOSIS — E8721 Acute metabolic acidosis: Secondary | ICD-10-CM | POA: Diagnosis not present

## 2021-04-01 DIAGNOSIS — I447 Left bundle-branch block, unspecified: Secondary | ICD-10-CM | POA: Diagnosis not present

## 2021-04-01 DIAGNOSIS — D500 Iron deficiency anemia secondary to blood loss (chronic): Secondary | ICD-10-CM | POA: Diagnosis not present

## 2021-04-01 DIAGNOSIS — Z96651 Presence of right artificial knee joint: Secondary | ICD-10-CM | POA: Diagnosis present

## 2021-04-01 DIAGNOSIS — N1831 Chronic kidney disease, stage 3a: Secondary | ICD-10-CM

## 2021-04-01 DIAGNOSIS — I959 Hypotension, unspecified: Secondary | ICD-10-CM | POA: Diagnosis not present

## 2021-04-01 DIAGNOSIS — L97519 Non-pressure chronic ulcer of other part of right foot with unspecified severity: Secondary | ICD-10-CM | POA: Diagnosis present

## 2021-04-01 DIAGNOSIS — E1122 Type 2 diabetes mellitus with diabetic chronic kidney disease: Secondary | ICD-10-CM | POA: Diagnosis present

## 2021-04-01 DIAGNOSIS — R195 Other fecal abnormalities: Secondary | ICD-10-CM | POA: Diagnosis present

## 2021-04-01 DIAGNOSIS — E11621 Type 2 diabetes mellitus with foot ulcer: Secondary | ICD-10-CM | POA: Diagnosis present

## 2021-04-01 DIAGNOSIS — D696 Thrombocytopenia, unspecified: Secondary | ICD-10-CM

## 2021-04-01 DIAGNOSIS — G620 Drug-induced polyneuropathy: Secondary | ICD-10-CM | POA: Diagnosis present

## 2021-04-01 DIAGNOSIS — R6511 Systemic inflammatory response syndrome (SIRS) of non-infectious origin with acute organ dysfunction: Secondary | ICD-10-CM | POA: Diagnosis not present

## 2021-04-01 DIAGNOSIS — R110 Nausea: Secondary | ICD-10-CM | POA: Diagnosis not present

## 2021-04-01 DIAGNOSIS — E669 Obesity, unspecified: Secondary | ICD-10-CM | POA: Diagnosis present

## 2021-04-01 DIAGNOSIS — I9589 Other hypotension: Secondary | ICD-10-CM | POA: Diagnosis not present

## 2021-04-01 DIAGNOSIS — N179 Acute kidney failure, unspecified: Secondary | ICD-10-CM | POA: Diagnosis not present

## 2021-04-01 DIAGNOSIS — Z833 Family history of diabetes mellitus: Secondary | ICD-10-CM

## 2021-04-01 DIAGNOSIS — R0602 Shortness of breath: Secondary | ICD-10-CM | POA: Diagnosis not present

## 2021-04-01 DIAGNOSIS — Z95810 Presence of automatic (implantable) cardiac defibrillator: Secondary | ICD-10-CM | POA: Diagnosis not present

## 2021-04-01 DIAGNOSIS — Z9221 Personal history of antineoplastic chemotherapy: Secondary | ICD-10-CM

## 2021-04-01 DIAGNOSIS — Z8546 Personal history of malignant neoplasm of prostate: Secondary | ICD-10-CM

## 2021-04-01 DIAGNOSIS — I5042 Chronic combined systolic (congestive) and diastolic (congestive) heart failure: Secondary | ICD-10-CM | POA: Diagnosis not present

## 2021-04-01 DIAGNOSIS — D509 Iron deficiency anemia, unspecified: Secondary | ICD-10-CM | POA: Diagnosis present

## 2021-04-01 DIAGNOSIS — Z66 Do not resuscitate: Secondary | ICD-10-CM | POA: Diagnosis not present

## 2021-04-01 DIAGNOSIS — I517 Cardiomegaly: Secondary | ICD-10-CM | POA: Diagnosis not present

## 2021-04-01 DIAGNOSIS — Z79899 Other long term (current) drug therapy: Secondary | ICD-10-CM

## 2021-04-01 DIAGNOSIS — I5043 Acute on chronic combined systolic (congestive) and diastolic (congestive) heart failure: Secondary | ICD-10-CM | POA: Diagnosis present

## 2021-04-01 DIAGNOSIS — R6510 Systemic inflammatory response syndrome (SIRS) of non-infectious origin without acute organ dysfunction: Secondary | ICD-10-CM | POA: Diagnosis not present

## 2021-04-01 DIAGNOSIS — I2510 Atherosclerotic heart disease of native coronary artery without angina pectoris: Secondary | ICD-10-CM | POA: Diagnosis present

## 2021-04-01 DIAGNOSIS — Z20822 Contact with and (suspected) exposure to covid-19: Secondary | ICD-10-CM | POA: Diagnosis not present

## 2021-04-01 DIAGNOSIS — E861 Hypovolemia: Secondary | ICD-10-CM

## 2021-04-01 DIAGNOSIS — Z886 Allergy status to analgesic agent status: Secondary | ICD-10-CM

## 2021-04-01 DIAGNOSIS — I4819 Other persistent atrial fibrillation: Secondary | ICD-10-CM | POA: Diagnosis present

## 2021-04-01 DIAGNOSIS — I255 Ischemic cardiomyopathy: Secondary | ICD-10-CM | POA: Diagnosis not present

## 2021-04-01 DIAGNOSIS — I700 Atherosclerosis of aorta: Secondary | ICD-10-CM | POA: Diagnosis not present

## 2021-04-01 DIAGNOSIS — Z801 Family history of malignant neoplasm of trachea, bronchus and lung: Secondary | ICD-10-CM

## 2021-04-01 DIAGNOSIS — R109 Unspecified abdominal pain: Secondary | ICD-10-CM | POA: Diagnosis not present

## 2021-04-01 DIAGNOSIS — Z6830 Body mass index (BMI) 30.0-30.9, adult: Secondary | ICD-10-CM

## 2021-04-01 DIAGNOSIS — Z882 Allergy status to sulfonamides status: Secondary | ICD-10-CM

## 2021-04-01 DIAGNOSIS — I421 Obstructive hypertrophic cardiomyopathy: Secondary | ICD-10-CM | POA: Diagnosis not present

## 2021-04-01 DIAGNOSIS — E785 Hyperlipidemia, unspecified: Secondary | ICD-10-CM | POA: Diagnosis present

## 2021-04-01 DIAGNOSIS — Z743 Need for continuous supervision: Secondary | ICD-10-CM | POA: Diagnosis not present

## 2021-04-01 DIAGNOSIS — Z8249 Family history of ischemic heart disease and other diseases of the circulatory system: Secondary | ICD-10-CM

## 2021-04-01 DIAGNOSIS — R531 Weakness: Secondary | ICD-10-CM | POA: Diagnosis not present

## 2021-04-01 DIAGNOSIS — E119 Type 2 diabetes mellitus without complications: Secondary | ICD-10-CM | POA: Diagnosis not present

## 2021-04-01 LAB — CBC WITH DIFFERENTIAL/PLATELET
Abs Immature Granulocytes: 0.69 10*3/uL — ABNORMAL HIGH (ref 0.00–0.07)
Basophils Absolute: 0.1 10*3/uL (ref 0.0–0.1)
Basophils Relative: 0 %
Eosinophils Absolute: 0 10*3/uL (ref 0.0–0.5)
Eosinophils Relative: 0 %
HCT: 38.5 % — ABNORMAL LOW (ref 39.0–52.0)
Hemoglobin: 12.5 g/dL — ABNORMAL LOW (ref 13.0–17.0)
Immature Granulocytes: 3 %
Lymphocytes Relative: 4 %
Lymphs Abs: 0.9 10*3/uL (ref 0.7–4.0)
MCH: 30.9 pg (ref 26.0–34.0)
MCHC: 32.5 g/dL (ref 30.0–36.0)
MCV: 95.1 fL (ref 80.0–100.0)
Monocytes Absolute: 1.6 10*3/uL — ABNORMAL HIGH (ref 0.1–1.0)
Monocytes Relative: 7 %
Neutro Abs: 20.6 10*3/uL — ABNORMAL HIGH (ref 1.7–7.7)
Neutrophils Relative %: 86 %
Platelets: 114 10*3/uL — ABNORMAL LOW (ref 150–400)
RBC: 4.05 MIL/uL — ABNORMAL LOW (ref 4.22–5.81)
RDW: 21.2 % — ABNORMAL HIGH (ref 11.5–15.5)
WBC: 24 10*3/uL — ABNORMAL HIGH (ref 4.0–10.5)
nRBC: 0.6 % — ABNORMAL HIGH (ref 0.0–0.2)

## 2021-04-01 LAB — URINALYSIS, ROUTINE W REFLEX MICROSCOPIC
Bilirubin Urine: NEGATIVE
Glucose, UA: NEGATIVE mg/dL
Hgb urine dipstick: NEGATIVE
Ketones, ur: 5 mg/dL — AB
Leukocytes,Ua: NEGATIVE
Nitrite: NEGATIVE
Protein, ur: NEGATIVE mg/dL
Specific Gravity, Urine: 1.016 (ref 1.005–1.030)
pH: 5 (ref 5.0–8.0)

## 2021-04-01 LAB — HEPATIC FUNCTION PANEL
ALT: 26 U/L (ref 0–44)
AST: 38 U/L (ref 15–41)
Albumin: 3.8 g/dL (ref 3.5–5.0)
Alkaline Phosphatase: 62 U/L (ref 38–126)
Bilirubin, Direct: 0.1 mg/dL (ref 0.0–0.2)
Indirect Bilirubin: 0.5 mg/dL (ref 0.3–0.9)
Total Bilirubin: 0.6 mg/dL (ref 0.3–1.2)
Total Protein: 6.5 g/dL (ref 6.5–8.1)

## 2021-04-01 LAB — COMPREHENSIVE METABOLIC PANEL
ALT: 28 U/L (ref 0–44)
AST: 46 U/L — ABNORMAL HIGH (ref 15–41)
Albumin: 4.2 g/dL (ref 3.5–5.0)
Alkaline Phosphatase: 65 U/L (ref 38–126)
Anion gap: 12 (ref 5–15)
BUN: 22 mg/dL (ref 8–23)
CO2: 22 mmol/L (ref 22–32)
Calcium: 9.1 mg/dL (ref 8.9–10.3)
Chloride: 100 mmol/L (ref 98–111)
Creatinine, Ser: 2.49 mg/dL — ABNORMAL HIGH (ref 0.61–1.24)
GFR, Estimated: 27 mL/min — ABNORMAL LOW (ref >60)
Glucose, Bld: 133 mg/dL — ABNORMAL HIGH (ref 70–99)
Potassium: 3.6 mmol/L (ref 3.5–5.1)
Sodium: 134 mmol/L — ABNORMAL LOW (ref 135–145)
Total Bilirubin: 0.6 mg/dL (ref 0.3–1.2)
Total Protein: 7.4 g/dL (ref 6.5–8.1)

## 2021-04-01 LAB — CREATININE, URINE, RANDOM: Creatinine, Urine: 262.08 mg/dL

## 2021-04-01 LAB — TYPE AND SCREEN
ABO/RH(D): O POS
Antibody Screen: NEGATIVE

## 2021-04-01 LAB — BRAIN NATRIURETIC PEPTIDE: B Natriuretic Peptide: 381.2 pg/mL — ABNORMAL HIGH (ref 0.0–100.0)

## 2021-04-01 LAB — POC OCCULT BLOOD, ED: Fecal Occult Bld: POSITIVE — AB

## 2021-04-01 LAB — PROCALCITONIN: Procalcitonin: 0.63 ng/mL

## 2021-04-01 LAB — MAGNESIUM: Magnesium: 1.8 mg/dL (ref 1.7–2.4)

## 2021-04-01 LAB — MRSA NEXT GEN BY PCR, NASAL: MRSA by PCR Next Gen: NOT DETECTED

## 2021-04-01 LAB — PHOSPHORUS: Phosphorus: 4 mg/dL (ref 2.5–4.6)

## 2021-04-01 LAB — GLUCOSE, CAPILLARY: Glucose-Capillary: 76 mg/dL (ref 70–99)

## 2021-04-01 LAB — COOXEMETRY PANEL
Carboxyhemoglobin: 1 % (ref 0.5–1.5)
Methemoglobin: <0.7 % (ref 0.0–1.5)
O2 Saturation: 58.1 %
Total hemoglobin: 12.4 g/dL (ref 12.0–16.0)

## 2021-04-01 LAB — LACTIC ACID, PLASMA
Lactic Acid, Venous: 3.7 mmol/L (ref 0.5–1.9)
Lactic Acid, Venous: 7.6 mmol/L (ref 0.5–1.9)

## 2021-04-01 LAB — SODIUM, URINE, RANDOM: Sodium, Ur: 45 mmol/L

## 2021-04-01 LAB — CBG MONITORING, ED: Glucose-Capillary: 91 mg/dL (ref 70–99)

## 2021-04-01 LAB — CK: Total CK: 163 U/L (ref 49–397)

## 2021-04-01 LAB — LIPASE, BLOOD: Lipase: 66 U/L — ABNORMAL HIGH (ref 11–51)

## 2021-04-01 LAB — TSH: TSH: 0.473 u[IU]/mL (ref 0.350–4.500)

## 2021-04-01 MED ORDER — PANTOPRAZOLE SODIUM 40 MG IV SOLR
40.0000 mg | Freq: Once | INTRAVENOUS | Status: AC
  Administered 2021-04-01: 40 mg via INTRAVENOUS
  Filled 2021-04-01: qty 10

## 2021-04-01 MED ORDER — HYDROCODONE-ACETAMINOPHEN 5-325 MG PO TABS: 1.0000 | ORAL_TABLET | ORAL | Status: DC | PRN

## 2021-04-01 MED ORDER — CHLORHEXIDINE GLUCONATE CLOTH 2 % EX PADS
6.0000 | MEDICATED_PAD | Freq: Every day | CUTANEOUS | Status: DC
  Administered 2021-04-01 – 2021-04-05 (×6): 6 via TOPICAL

## 2021-04-01 MED ORDER — METRONIDAZOLE 500 MG/100ML IV SOLN
500.0000 mg | Freq: Two times a day (BID) | INTRAVENOUS | Status: DC
  Administered 2021-04-02 – 2021-04-03 (×3): 500 mg via INTRAVENOUS
  Filled 2021-04-01 (×3): qty 100

## 2021-04-01 MED ORDER — ACETAMINOPHEN 325 MG PO TABS: 650.0000 mg | ORAL_TABLET | Freq: Four times a day (QID) | ORAL | Status: DC | PRN

## 2021-04-01 MED ORDER — METRONIDAZOLE 500 MG/100ML IV SOLN: 500.0000 mg | Freq: Once | INTRAVENOUS | Status: DC

## 2021-04-01 MED ORDER — ACETAMINOPHEN 650 MG RE SUPP: 650.0000 mg | Freq: Four times a day (QID) | RECTAL | Status: DC | PRN

## 2021-04-01 MED ORDER — METRONIDAZOLE 500 MG/100ML IV SOLN
500.0000 mg | Freq: Once | INTRAVENOUS | Status: AC
  Administered 2021-04-01: 500 mg via INTRAVENOUS
  Filled 2021-04-01: qty 100

## 2021-04-01 MED ORDER — SODIUM CHLORIDE 0.9 % IV SOLN: 2.0000 g | INTRAVENOUS | Status: DC

## 2021-04-01 MED ORDER — SODIUM CHLORIDE 0.9 % IV SOLN: 2.0000 g | Freq: Once | INTRAVENOUS | Status: DC

## 2021-04-01 MED ORDER — ORAL CARE MOUTH RINSE
15.0000 mL | Freq: Two times a day (BID) | OROMUCOSAL | Status: DC
  Administered 2021-04-02 – 2021-04-05 (×7): 15 mL via OROMUCOSAL

## 2021-04-01 MED ORDER — CEFEPIME HCL 2 G IJ SOLR
2.0000 g | Freq: Once | INTRAMUSCULAR | Status: AC
  Administered 2021-04-01: 2 g via INTRAVENOUS
  Filled 2021-04-01: qty 2

## 2021-04-01 MED ORDER — SODIUM CHLORIDE 0.9 % IV BOLUS
500.0000 mL | Freq: Once | INTRAVENOUS | Status: AC
Start: 2021-04-01 — End: 2021-04-02
  Administered 2021-04-01: 500 mL via INTRAVENOUS

## 2021-04-01 MED ORDER — SODIUM CHLORIDE 0.9 % IV SOLN
75.0000 mL/h | INTRAVENOUS | Status: AC
  Administered 2021-04-01 – 2021-04-02 (×2): 75 mL/h via INTRAVENOUS

## 2021-04-01 MED ORDER — ATORVASTATIN CALCIUM 40 MG PO TABS
40.0000 mg | ORAL_TABLET | Freq: Every day | ORAL | Status: DC
  Administered 2021-04-02 – 2021-04-06 (×5): 40 mg via ORAL
  Filled 2021-04-01 (×5): qty 1

## 2021-04-01 MED ORDER — SODIUM CHLORIDE 0.9 % IV BOLUS
1000.0000 mL | Freq: Once | INTRAVENOUS | Status: AC
Start: 2021-04-01 — End: 2021-04-02
  Administered 2021-04-02: 1000 mL via INTRAVENOUS

## 2021-04-01 MED ORDER — PANTOPRAZOLE SODIUM 40 MG IV SOLR
40.0000 mg | Freq: Two times a day (BID) | INTRAVENOUS | Status: DC
  Administered 2021-04-01 – 2021-04-05 (×8): 40 mg via INTRAVENOUS
  Filled 2021-04-01 (×6): qty 10

## 2021-04-01 MED ORDER — INSULIN ASPART 100 UNIT/ML IJ SOLN
0.0000 [IU] | INTRAMUSCULAR | Status: DC
  Administered 2021-04-03: 1 [IU] via SUBCUTANEOUS
  Administered 2021-04-04: 2 [IU] via SUBCUTANEOUS
  Administered 2021-04-04 – 2021-04-05 (×3): 1 [IU] via SUBCUTANEOUS
  Filled 2021-04-01: qty 0.09

## 2021-04-01 MED ORDER — LACTATED RINGERS IV BOLUS (SEPSIS)
500.0000 mL | Freq: Once | INTRAVENOUS | Status: AC
  Administered 2021-04-01: 500 mL via INTRAVENOUS

## 2021-04-01 NOTE — Assessment & Plan Note (Signed)
No longer on anticoagulation hold Coreg given hypotension

## 2021-04-01 NOTE — Subjective & Objective (Signed)
Reports abdominal pain and cramping has been having some vomiting and diarrhea for the past 2 days no sick contacts recently getting chemotherapy for history of gastric cancer last chemoinfusion was on 22 March 2021 In the past diarrhea was not associated with his chemotherapy.  Also have had recent Neulasta injection Reports some dark stools but no hematemesis no blood in vomit or blood in stool that he could see.  Has not had decreased energy no chest pain no fevers no chills.

## 2021-04-01 NOTE — Consult Note (Signed)
NAME:  Jon Williams, MRN:  [id number], DOB:  1950/10/06, LOS: 0 ADMISSION DATE:  04/01/2021, CONSULTATION DATE:  04/01/2021 REFERRING MD:  Roel Cluck, CHIEF COMPLAINT:  Diarrhea   History of Present Illness:   Jon Williams is a 71 year old male with a medical history significant for systolic heart failure (EF of 20 to 25%), gastric cancer with ongoing chemotherapy, coronary artery disease, atrial fibrillation, diabetes who presented to [facility] long hospital with diarrhea and abdominal pain.  Patient notes that he has not really been able to eat.  Of note, the patient recently received chemotherapy on 8 February.  He has been afebrile.  He denies any overt blood in his stools.  Notes some abdominal pain which is with cramps.  Denies any lightheadedness.  He does note that he has not really been able to eat or drink much.  On presentation to the ED, patient was tachycardic and hypotensive.  Since then, his tachycardia has resolved and his blood pressures have largely normalized to what they are regularly.  He is receiving cefepime and Flagyl.  Labs demonstrate a BNP of 381.  Negative procalcitonin.  Normal TSH.  BUN of 22 and creatinine 2.49.  Patient additionally has a leukocytosis of 14.2.  Critical care is being consulted given the patient's low-normal blood pressures and climbing lactic acidosis.  Pertinent  Medical History   HFrEF Gastric cancer with ongoing chemotherapy Coronary arterial disease Atrial fibrillation Diabetes  Significant Hospital Events: Including procedures, antibiotic start and stop dates in addition to other pertinent events   Started on cefepime and Flagyl   Objective   Blood pressure 91/61, pulse 69, temperature 97.8 F (36.6 C), temperature source Oral, resp. rate 15, height 5\' 6"  (1.676 m), weight 85.6 kg, SpO2 100 %.       No intake or output data in the 24 hours ending 04/01/21 2341 Filed Weights   04/01/21 1434 04/01/21 2130  Weight: 88.9 kg 85.6 kg    Examination: General: Normocephalic atraumatic.  Patient cooperates with exam.  No acute distress. HENT: Grossly normal exam Lungs: Lungs clear to auscultation bilaterally Cardiovascular: Regular rate and rhythm Abdomen: Soft, nontender, nondistended, nonperitonitic exam Extremities: No signs of lower extremity swelling Neuro: Alert and oriented  Resolved Hospital Problem list   None  Assessment & Plan:   Hypotension Rising Lactic acidosis - Patient does have an ejection fraction of 25% - Presenting lactate was 2.7 but is now increased to 7.6 - Presents with diarrhea, abdominal pain and decreased p.o. intake.  No fevers - Blood pressures are similar to his chronic blood pressures according to family at the bedside - Patient reports minimal urine output.  Not being strictly monitored currently - Point-of-care ultrasound of the patient's IVC shows that it is nearly 100% collapsible. - BNP is lower than previously noted.  No peripheral edema or pulmonary edema. - Venous sat off of the patient's port (which would be central) is 58.1% which is not entirely consistent with cardiogenic shock - Would favor hypovolemic shock at this time Plan -Recommend continued monitoring of lactic acid -Recommend continued careful fluid resuscitation as the patient clinically appears to be hypovolemic -Agree with GIP panel and C. difficile testing to further evaluate the patient's diarrheal illness -Recommend formal echo  Critical care will continue to follow  Best Practice (right click and "Reselect all SmartList Selections" daily)   Diet/type: clear liquids DVT prophylaxis: other GI prophylaxis: N/A Lines: N/A Foley:  N/A Code Status:  DNR  Labs   CBC:  Recent Labs  Lab 04/01/21 1430  WBC 24.0*  NEUTROABS 20.6*  HGB 12.5*  HCT 38.5*  MCV 95.1  PLT 114*    Basic Metabolic Panel: Recent Labs  Lab 04/01/21 1430 04/01/21 1918  NA 134*  --   K 3.6  --   CL 100  --   CO2 22  --    GLUCOSE 133*  --   BUN 22  --   CREATININE 2.49*  --   CALCIUM 9.1  --   MG 1.8  --   PHOS  --  4.0   GFR: Estimated Creatinine Clearance: 27.5 mL/min (A) (by C-G formula based on SCr of 2.49 mg/dL (H)). Recent Labs  Lab 04/01/21 1430 04/01/21 1746  WBC 24.0*  --   LATICACIDVEN 3.7* 7.6*    Liver Function Tests: Recent Labs  Lab 04/01/21 1430 04/01/21 1918  AST 46* 38  ALT 28 26  ALKPHOS 65 62  BILITOT 0.6 0.6  PROT 7.4 6.5  ALBUMIN 4.2 3.8   Recent Labs  Lab 04/01/21 1430  LIPASE 66*   No results for input(s): AMMONIA in the last 168 hours.  ABG    Component Value Date/Time   PHART 7.406 03/02/2016 1158   PCO2ART 34.5 03/02/2016 1158   PO2ART 83.0 03/02/2016 1158   HCO3 21.7 03/02/2016 1158   TCO2 23 03/02/2016 1158   ACIDBASEDEF 2.0 03/02/2016 1158   O2SAT 58.1 04/01/2021 2055     Coagulation Profile: No results for input(s): INR, PROTIME in the last 168 hours.  Cardiac Enzymes: Recent Labs  Lab 04/01/21 1918  CKTOTAL 163    HbA1C: Hgb A1c MFr Bld  Date/Time Value Ref Range Status  12/01/2020 04:58 AM 6.2 (H) 4.8 - 5.6 % Final    Comment:    (NOTE) Pre diabetes:          5.7%-6.4%  Diabetes:              >6.4%  Glycemic control for   <7.0% adults with diabetes     CBG: Recent Labs  Lab 04/01/21 1958 04/01/21 2332  GLUCAP 91 76    Review of Systems:   Review of Systems  Constitutional:  Positive for malaise/fatigue. Negative for chills and fever.  HENT: Negative.    Eyes: Negative.   Respiratory: Negative.    Cardiovascular:  Negative for chest pain, palpitations, orthopnea and leg swelling.  Gastrointestinal:  Positive for diarrhea, nausea and vomiting. Negative for abdominal pain and heartburn.  Skin: Negative.     Past Medical History:  He,  has a past medical history of AICD (automatic cardioverter/defibrillator) present (05/25/2016), Bunion, right, Chronic combined systolic and diastolic CHF (congestive heart  failure) ([place]), Coronary artery disease involving native coronary artery without angina pectoris (04/17/2016), Diabetes mellitus without complication ([place]), DJD (degenerative joint disease), History of atrial fibrillation, History of atrial flutter, History of cardiomegaly (06/07/2016), History of colon polyps (06/28/2017), LBBB (left bundle branch block), NICM (nonischemic cardiomyopathy) ([place]), OA (osteoarthritis), Other secondary pulmonary hypertension ([place]) (04/17/2016), Prostate cancer ([place]) (2019), and Sigmoid diverticulosis (06/28/2017).   Surgical History:   Past Surgical History:  Procedure Laterality Date   BIOPSY  12/01/2020   Procedure: BIOPSY;  Surgeon: [name], MD;  Location: [place];  Service: Endoscopy;;   BIV ICD INSERTION CRT-D N/A 05/25/2016   Procedure: BiV ICD Insertion CRT-D;  Surgeon: [name], MD;  Location: [place] [facility];  Service: Cardiovascular;  Laterality: N/A;   CARDIAC CATHETERIZATION N/A 03/02/2016  Procedure: Right/Left Heart Cath and Coronary Angiography;  Surgeon: Nelva Bush, MD;  Location: [place] [facility];  Service: Cardiovascular;  Laterality: N/A;   CARDIOVERSION N/A 07/17/2016   Procedure: Cardioversion;  Surgeon: [name], MD;  Location: [place] [facility];  Service: Cardiovascular;  Laterality: N/A;   COLONOSCOPY WITH PROPOFOL N/A 06/28/2017   Procedure: COLONOSCOPY WITH PROPOFOL;  Surgeon: [name], MD;  Location: [facility];  Service: Endoscopy;  Laterality: N/A;   colonscopy  2009   ESOPHAGOGASTRODUODENOSCOPY N/A 12/01/2020   Procedure: ESOPHAGOGASTRODUODENOSCOPY (EGD);  Surgeon: [name], MD;  Location: [place];  Service: Endoscopy;  Laterality: N/A;  IDA/guaiac positive stools   ESOPHAGOGASTRODUODENOSCOPY (EGD) WITH PROPOFOL N/A 12/16/2020   Procedure: ESOPHAGOGASTRODUODENOSCOPY (EGD) WITH PROPOFOL;  Surgeon: [name], MD;  Location: [facility];  Service: Endoscopy;  Laterality: N/A;   GOLD SEED  IMPLANT N/A 12/24/2017   Procedure: GOLD SEED IMPLANT, TRANSERINEAL;  Surgeon: Festus Aloe, MD;  Location: [facility];  Service: Urology;  Laterality: N/A;   INSERT / REPLACE / REMOVE PACEMAKER     LEAD REVISION  10/10/2018   LEAD REVISION/REPAIR N/A 10/10/2018   Procedure: LEAD REVISION/REPAIR;  Surgeon: [name], MD;  Location: [place] [facility];  Service: Cardiovascular;  Laterality: N/A;   POLYPECTOMY  06/28/2017   Procedure: POLYPECTOMY;  Surgeon: [name], MD;  Location: [facility];  Service: Endoscopy;;  ascending and descending colon polyp   PORTACATH PLACEMENT Right 12/23/2020   Procedure: INSERTION PORT-A-CATH;  Surgeon: [name], MD;  Location: [place];  Service: General;  Laterality: Right;   PROSTATE BIOPSY  02/20/2017   SPACE OAR INSTILLATION N/A 12/24/2017   Procedure: SPACE OAR INSTILLATION;  Surgeon: Festus Aloe, MD;  Location: [facility];  Service: Urology;  Laterality: N/A;   TOTAL KNEE ARTHROPLASTY Right 08/16/2017   Procedure: RIGHT TOTAL KNEE ARTHROPLASTY;  Surgeon: [name], MD;  Location: [place];  Service: Orthopedics;  Laterality: Right;   UPPER ESOPHAGEAL ENDOSCOPIC ULTRASOUND (EUS) N/A 12/16/2020   Procedure: UPPER ESOPHAGEAL ENDOSCOPIC ULTRASOUND (EUS);  Surgeon: [name], MD;  Location: [facility];  Service: Endoscopy;  Laterality: N/A;     Social History:   reports that he has never smoked. He has never used smokeless tobacco. He reports that he does not drink alcohol and does not use drugs.   Family History:  His family history includes Cancer in his father; Diabetes in his father and mother; Healthy in his brother and sister; Heart attack in his brother; Heart disease in his mother; Heart disease (age of onset: 34) in his brother; Hypertension in his father and mother.   Allergies Allergies  Allergen Reactions   Aspirin Anaphylaxis and Hives   Sulfa Antibiotics Anaphylaxis and Hives   Other Other (See Comments)     Home  Medications  Prior to Admission medications   Medication Sig Start Date End Date Taking? Authorizing Provider  atorvastatin (LIPITOR) 40 MG tablet TAKE 1 TABLET(40 MG) BY MOUTH DAILY Patient taking differently: Take 40 mg by mouth daily. 11/04/20  Yes [name], MD  carvedilol (COREG) 3.125 MG tablet TAKE 1 TABLET(3.125 MG) BY MOUTH TWICE DAILY Patient taking differently: Take 3.125 mg by mouth 2 (two) times daily with a meal. 11/04/20  Yes [name], MD  cholecalciferol (VITAMIN D3) 25 MCG (1000 UT) tablet Take 1,000 Units by mouth daily with lunch.    Yes [provider]  dexamethasone (DECADRON) 4 MG tablet Take 2 tablets (8 mg total)  by mouth daily. Take 1 tab daily starting the day after chemotherapy for 2 days. Take with food. 12/20/20  Yes Truitt Merle, MD  docusate sodium (COLACE) 100 MG capsule Take 1 capsule (100 mg total) by mouth 2 (two) times daily as needed for mild constipation. 01/06/21  Yes Davonna Belling, MD  furosemide (LASIX) 40 MG tablet TAKE 1 TABLET(40 MG) BY MOUTH TWICE DAILY Patient taking differently: Take 40 mg by mouth 2 (two) times daily. 03/16/21  Yes [name], MD  lidocaine-prilocaine (EMLA) cream Apply to affected area once 12/20/20  Yes Truitt Merle, MD  metFORMIN (GLUCOPHAGE-XR) 500 MG 24 hr tablet Take 500 mg by mouth every evening. 06/13/20  Yes [provider]  Multiple Vitamin (MULTIVITAMIN WITH MINERALS) TABS tablet Take 1 tablet by mouth daily with lunch. One-A-Day   Yes [provider]  ondansetron (ZOFRAN) 8 MG tablet Take 1 tablet (8 mg total) by mouth 2 (two) times daily as needed for refractory nausea / vomiting. Start on day 3 after chemotherapy. 12/20/20  Yes Truitt Merle, MD  pantoprazole (PROTONIX) 40 MG tablet Take 1 tablet (40 mg total) by mouth 2 (two) times daily. 12/02/20  Yes Aline August, MD  potassium chloride (KLOR-CON) 10 MEQ tablet TAKE 1 TABLET(10 MEQ) BY MOUTH TWICE DAILY Patient taking differently: Take  10 mEq by mouth 2 (two) times daily. 12/05/20  Yes [name], MD  prochlorperazine (COMPAZINE) 10 MG tablet Take 1 tablet (10 mg total) by mouth every 6 (six) hours as needed (Nausea or vomiting). 02/15/21  Yes Truitt Merle, MD  sacubitril-valsartan (ENTRESTO) 24-26 MG Take 1 tablet by mouth 2 (two) times daily. 12/21/20  Yes [name], MD  sotalol (BETAPACE) 80 MG tablet Take 1.5 tablets (120 mg total) by mouth in the morning and at bedtime. 03/01/21  Yes [name], MD  Naphazoline HCl (CLEAR EYES OP) Place 1 drop into both eyes daily as needed (irritation). Patient not taking: Reported on 04/01/2021    [provider]  polyethylene glycol (MIRALAX / GLYCOLAX) 17 g packet Take 17 g by mouth daily as needed for moderate constipation. Patient not taking: Reported on 04/01/2021 01/06/21   Davonna Belling, MD     Critical care time: 40 minutes

## 2021-04-01 NOTE — Assessment & Plan Note (Signed)
Chronic-stable.

## 2021-04-01 NOTE — Assessment & Plan Note (Signed)
Followed by Dr. Lysle Rubens emailed that patient has been admitted Currently receiving chemotherapy recently had Neupogen

## 2021-04-01 NOTE — Progress Notes (Signed)
Pharmacy Antibiotic Note  Jon Williams is a 71 y.o. male admitted on 04/01/2021 with IAI.  Pharmacy has been consulted for cefepime dosing.  Plan: Cefepime 2 gm IV q24  Height: 5\' 6"  (167.6 cm) Weight: 88.9 kg (196 lb) IBW/kg (Calculated) : 63.8  Temp (24hrs), Avg:99 F (37.2 C), Min:99 F (37.2 C), Max:99 F (37.2 C)  Recent Labs  Lab 04/01/21 1430  WBC 24.0*  CREATININE 2.49*  LATICACIDVEN 3.7*    Estimated Creatinine Clearance: 28 mL/min (A) (by C-G formula based on SCr of 2.49 mg/dL (H)).    Allergies  Allergen Reactions   Aspirin Anaphylaxis and Hives   Sulfa Antibiotics Anaphylaxis and Hives   Other Other (See Comments)    Thank you for allowing pharmacy to be a part of this patients care.  Eudelia Bunch, Pharm.D 04/01/2021 7:37 PM

## 2021-04-01 NOTE — Assessment & Plan Note (Signed)
-  SIRS criteria met with  elevated white blood cell count,       Component Value Date/Time   WBC 24.0 (H) 04/01/2021 1430   LYMPHSABS 0.9 04/01/2021 1430   LYMPHSABS 0.8 04/25/2018 1601    RR >20 brief tachycardia Today's Vitals   04/01/21 1435 04/01/21 1440 04/01/21 1530 04/01/21 1730  BP: 94/62 94/62  108/80  Pulse:  78 73 73  Resp: $Remo'15 18 19 20  'LSWLR$ Temp:  99 F (37.2 C)    TempSrc:  Rectal    SpO2:  96% 100% 100%  Weight:      Height:      PainSc:        The recent clinical data is shown below. Vitals:   04/01/21 1435 04/01/21 1440 04/01/21 1530 04/01/21 1730  BP: 94/62 94/62  108/80  Pulse:  78 73 73  Resp: $Remo'15 18 19 20  'PuNIi$ Temp:  99 F (37.2 C)    TempSrc:  Rectal    SpO2:  96% 100% 100%  Weight:      Height:         -Most likely source being:  intra-abdominal,   Patient meeting criteria for Severe sepsis with    evidence of end organ damage/organ dysfunction such as   Acute Kidney Injury with Cr > 2,  Lab Results  Component Value Date   CREATININE 2.49 (H) 04/01/2021   CREATININE 1.33 (H) 03/22/2021     elevated lactic acid >2     Component Value Date/Time   LATICACIDVEN 3.7 (HH) 04/01/2021 1430         Component Value Date/Time   PLT 114 (L) 04/01/2021 1430   PLT 173 10/08/2018 0756    - Obtain serial lactic acid and procalcitonin level.  - Initiated IV antibiotics in ER: Antibiotics Given (last 72 hours)    Date/Time Action Medication Dose Rate   04/01/21 1619 New Bag/Given   metroNIDAZOLE (FLAGYL) IVPB 500 mg 500 mg 100 mL/hr   04/01/21 1621 New Bag/Given   ceFEPIme (MAXIPIME) 2 g in sodium chloride 0.9 % 100 mL IVPB 2 g 200 mL/hr      Will continue     - await results of blood and urine culture  - Rehydrate aggressively  Intravenous fluids were administered, .     7:05 PM

## 2021-04-01 NOTE — Assessment & Plan Note (Signed)
Somewhat responsive to IV fluids suspect partially secondary to dehydration Worsening lactic acid discussed with PCCM appreciate their consult. Obtained venous O2 saturation from port which was above 50 suggesting less consistent with cardiogenic shock Continue antibiotics gentle fluids as needed to maintain blood pressure Patient with history of severe CHF with EF of 20%

## 2021-04-01 NOTE — Sepsis Progress Note (Signed)
Secure chat communication took place with current provider, aware of current lactic with additional LA's ordered, discussion took place regarding ordered fluids/sepsis, aware of last echo

## 2021-04-01 NOTE — Progress Notes (Signed)
A consult was received from an ED physician for cefepime per pharmacy dosing.  The patient's profile has been reviewed for ht/wt/allergies/indication/available labs.    A one time order has been placed for Cefepime 2 gm.    Further antibiotics/pharmacy consults should be ordered by admitting physician if indicated.                       Thank you,  Eudelia Bunch, Pharm.D 04/01/2021 4:24 PM

## 2021-04-01 NOTE — Assessment & Plan Note (Signed)
Hold diuretics administer gentle IV fluids monitor fluid status

## 2021-04-01 NOTE — H&P (Signed)
° ° °Jon Williams MRN:[id number] DOB: 06/27/1950 DOA: 04/01/2021 ° ° °  °PCP: Polite, Ronald, MD   °Outpatient Specialists:  °CARDS:   Dr. [name] °  ° Oncology   Dr. Feng °GI Dr.  HUNG °  ° °Patient arrived to ER on 04/01/21 at 1419 °Referred by Attending Doutova, Anastassia, MD ° ° °Patient coming from:   ° home Lives  With family °  ° °Chief Complaint:   °Chief Complaint  °Patient presents with  ° Emesis  ° Diarrhea  ° Weakness  ° ° °HPI: °Jon Williams is a 71 y.o. male with medical history significant of systolic CHF EF 20-25%, gastric cancer status receiving chemotherapy °CAD, atrial fibrillation, diabetes °Presented with diarrhea and abdominal pain °Reports abdominal pain and cramping has been having some vomiting and diarrhea for the past 2 days no sick contacts recently getting chemotherapy for history of gastric cancer last chemoinfusion was on 22 March 2021 °In the past diarrhea was not associated with his chemotherapy.  Also have had recent Neulasta injection °Reports some dark stools but no hematemesis no blood in vomit or blood in stool that he could see.  Has not had decreased energy no chest pain no fevers no chills. °  °Report bilateral leg pain and stiffness worse in his feet especially his heel  ° ° Initial COVID TEST in house  PCR testing  Pending ° °Lab Results  °Component Value Date  ° SARSCOV2NAA NEGATIVE 11/30/2020  ° SARSCOV2NAA NEGATIVE 10/08/2018  ° °  °Regarding pertinent Chronic problems:   ° ° Hyperlipidemia -   on statins Lipitor °Lipid Panel  °   °Component Value Date/Time  ° CHOL 132 06/06/2016 1453  ° TRIG 53 06/06/2016 1453  ° HDL 60 06/06/2016 1453  ° CHOLHDL 2.2 06/06/2016 1453  ° LDLCALC 61 06/06/2016 1453  ° LABVLDL 11 06/06/2016 1453  ° ° ° HTN on Coreg ° ° chronic CHF diastolic/systolic/ combined - last echo October 2022  °Lasix, Entresto sotalol °  CAD  - On  statin, betablocker,   °               -  followed by cardiology °                 ° °  DM 2 -  °Lab Results   °Component Value Date  ° HGBA1C 6.2 (H) 12/01/2020  ° on  PO meds only  °   °  ° A. Fib -  - CHA2DS2 vas score  5  °  °  Not on anticoagulation secondary to  getting chemo ° °       -  Rate control:  Currently controlled with Coreg   °   ° ° CKD stage IIIa- baseline Cr  1.3 °Estimated Creatinine Clearance: 28 mL/min (A) (by C-G formula based on SCr of 2.49 mg/dL (H)). ° °Lab Results  °Component Value Date  ° CREATININE 2.49 (H) 04/01/2021  ° CREATININE 1.33 (H) 03/22/2021  ° CREATININE 1.18 03/07/2021  ° °  Chronic anemia - baseline hg Hemoglobin & Hematocrit  °Recent Labs  °  03/07/21 °1038 03/22/21 °0901 04/01/21 °1430  °HGB 11.3* 11.8* 12.5*  ° ° ° °While in ER: °  °Noted to meet Sepsis criteria °CT abdomen unremarkable Hemoccult positive but hemoglobin is stable ER provider sent message to GI °  °Ordered °  ° °CXR -  NON acute cardiomegaly ° °CTabd/pelvis -  non acute °  °Following Medications were   ordered in ER: °Medications  °lactated ringers bolus 500 mL (0 mLs Intravenous Stopped 04/01/21 1738)  °ceFEPIme (MAXIPIME) 2 g in sodium chloride 0.9 % 100 mL IVPB (0 g Intravenous Stopped 04/01/21 1722)  °metroNIDAZOLE (FLAGYL) IVPB 500 mg (0 mg Intravenous Stopped 04/01/21 1732)  °pantoprazole (PROTONIX) injection 40 mg (40 mg Intravenous Given 04/01/21 1859)  °  °___  °ER Provider sent msg to      Dr.Armbruster  °  °  °ED Triage Vitals  °Enc Vitals Group  °   BP 04/01/21 1426 (!) 84/75  °   Pulse Rate 04/01/21 1426 (!) 144  °   Resp 04/01/21 1426 16  °   Temp 04/01/21 1440 99 °F (37.2 °C)  °   Temp Source 04/01/21 1440 Rectal  °   SpO2 04/01/21 1426 96 %  °   Weight 04/01/21 1434 196 lb (88.9 kg)  °   Height 04/01/21 1434 5' 6" (1.676 m)  °   Head Circumference --   °   Peak Flow --   °   Pain Score 04/01/21 1434 10  °   Pain Loc --   °   Pain Edu? --   °   Excl. in GC? --   °TMAX(24)@    ° _________________________________________ °Significant initial  Findings: °Abnormal Labs Reviewed  °COMPREHENSIVE METABOLIC  PANEL - Abnormal; Notable for the following components:  °    Result Value  ° Sodium 134 (*)   ° Glucose, Bld 133 (*)   ° Creatinine, Ser 2.49 (*)   ° AST 46 (*)   ° GFR, Estimated 27 (*)   ° All other components within normal limits  °CBC WITH DIFFERENTIAL/PLATELET - Abnormal; Notable for the following components:  ° WBC 24.0 (*)   ° RBC 4.05 (*)   ° Hemoglobin 12.5 (*)   ° HCT 38.5 (*)   ° RDW 21.2 (*)   ° Platelets 114 (*)   ° nRBC 0.6 (*)   ° Neutro Abs 20.6 (*)   ° Monocytes Absolute 1.6 (*)   ° Abs Immature Granulocytes 0.69 (*)   ° All other components within normal limits  °LIPASE, BLOOD - Abnormal; Notable for the following components:  ° Lipase 66 (*)   ° All other components within normal limits  °LACTIC ACID, PLASMA - Abnormal; Notable for the following components:  ° Lactic Acid, Venous 3.7 (*)   ° All other components within normal limits  °POC OCCULT BLOOD, ED - Abnormal; Notable for the following components:  ° Fecal Occult Bld POSITIVE (*)   ° All other components within normal limits  ° °   °_________________________ °Troponin  ordered °ECG: Ordered °Personally reviewed by me showing: °HR : 77 °Rhythm:  LBBB   °QTC 514 °  ° °The recent clinical data is shown below. °Vitals:  ° 04/01/21 1435 04/01/21 1440 04/01/21 1530 04/01/21 1730  °BP: 94/62 94/62  108/80  °Pulse:  78 73 73  °Resp: 15 18 19 20  °Temp:  99 °F (37.2 °C)    °TempSrc:  Rectal    °SpO2:  96% 100% 100%  °Weight:      °Height:      °  °  °WBC ° °   °Component Value Date/Time  ° WBC 24.0 (H) 04/01/2021 1430  ° LYMPHSABS 0.9 04/01/2021 1430  ° LYMPHSABS 0.8 04/25/2018 1601  ° MONOABS 1.6 (H) 04/01/2021 1430  ° EOSABS 0.0 04/01/2021 1430  ° EOSABS 0.1 04/25/2018 1601  °   1601   BASOSABS 0.1 04/01/2021 1430   BASOSABS 0.0 04/25/2018 1601     Lactic Acid, Venous    Component Value Date/Time   LATICACIDVEN 3.7 (HH) 04/01/2021 1430     Procalcitonin   Ordered Lactic Acid, Venous    Component Value Date/Time   LATICACIDVEN 7.6 (HH)  04/01/2021 1746       UA   ordered   Urine analysis:    Component Value Date/Time   COLORURINE YELLOW 08/05/2017 1344   APPEARANCEUR HAZY (A) 08/05/2017 1344   LABSPEC 1.020 08/05/2017 1344   PHURINE 5.0 08/05/2017 1344   GLUCOSEU NEGATIVE 08/05/2017 1344   HGBUR NEGATIVE 08/05/2017 1344   BILIRUBINUR NEGATIVE 08/05/2017 1344   KETONESUR NEGATIVE 08/05/2017 1344   PROTEINUR NEGATIVE 08/05/2017 1344   NITRITE NEGATIVE 08/05/2017 1344   LEUKOCYTESUR NEGATIVE 08/05/2017 1344    _______________________________________________ Hospitalist was called for admission for aki sepsis, hemoccult pos stools  The following Work up has been ordered so far:  Orders Placed This Encounter  Procedures   Critical Care   Culture, blood (routine x 2)   Resp Panel by RT-PCR (Flu A&B, Covid) Nasopharyngeal Swab   C Difficile Quick Screen w PCR reflex   Gastrointestinal Panel by PCR , Stool   DG Chest Port 1 View   CT ABDOMEN PELVIS WO CONTRAST   Comprehensive metabolic panel   CBC with Differential   Lipase, blood   Lactic acid, plasma   Urinalysis, Routine w reflex microscopic   Magnesium   Diet NPO time specified   Check Rectal Temperature   Cardiac monitoring   Document height and weight   Assess and Document Glasgow Coma Scale   Document vital signs within 1-hour of fluid bolus completion. Notify provider of abnormal vital signs despite fluid resuscitation.   DO NOT delay antibiotics if unable to obtain blood culture.   Notify provider for difficulties obtaining IV access.   Insert peripheral IV x 2   Initiate Carrier Fluid Protocol   Cardiac monitoring   Consult to hospitalist   Enteric precautions (UV disinfection) C difficile, Norovirus   Pulse oximetry, continuous   POC occult blood, ED   ED EKG   Type and screen [place] 2nd peripheral IV if not already present.   Admit to Inpatient (patient's expected length of stay will be greater than 2  midnights or inpatient only procedure)     OTHER Significant initial  Findings:  labs showing:    Recent Labs  Lab 04/01/21 1430  NA 134*  K 3.6  CO2 22  GLUCOSE 133*  BUN 22  CREATININE 2.49*  CALCIUM 9.1  MG 1.8    Cr   Up from baseline see below Lab Results  Component Value Date   CREATININE 2.49 (H) 04/01/2021   CREATININE 1.33 (H) 03/22/2021   CREATININE 1.18 03/07/2021    Recent Labs  Lab 04/01/21 1430  AST 46*  ALT 28  ALKPHOS 65  BILITOT 0.6  PROT 7.4  ALBUMIN 4.2   Lab Results  Component Value Date   CALCIUM 9.1 04/01/2021       Plt: Lab Results  Component Value Date   PLT 114 (L) 04/01/2021     COVID-19 Labs  No results for input(s): DDIMER, FERRITIN, LDH, CRP in the last 72 hours.  Lab Results  Component Value Date   SARSCOV2NAA NEGATIVE 11/30/2020   SARSCOV2NAA NEGATIVE 10/08/2018     ABG    Component Value  PHART 7.406 03/02/2016 1158  ° PCO2ART 34.5 03/02/2016 1158  ° PO2ART 83.0 03/02/2016 1158  ° HCO3 21.7 03/02/2016 1158  ° TCO2 23 03/02/2016 1158  ° ACIDBASEDEF 2.0 03/02/2016 1158  ° O2SAT 96.0 03/02/2016 1158  ° °   °Recent Labs  °Lab 04/01/21 °1430  °WBC 24.0*  °NEUTROABS 20.6*  °HGB 12.5*  °HCT 38.5*  °MCV 95.1  °PLT 114*  ° ° °HG/HCT   stable,   °   °Component Value Date/Time  ° HGB 12.5 (L) 04/01/2021 1430  ° HGB 11.8 (L) 10/08/2018 0756  ° HCT 38.5 (L) 04/01/2021 1430  ° HCT 35.0 (L) 10/08/2018 0756  ° MCV 95.1 04/01/2021 1430  ° MCV 96 10/08/2018 0756  °  ° °Recent Labs  °Lab 04/01/21 °1430  °LIPASE 66*  ° °  ° °Cardiac Panel (last 3 results) °Recent Labs  °  04/01/21 °1918  °CKTOTAL 163  ° ° .car °BNP (last 3 results) °No results for input(s): BNP in the last 8760 hours. °  ° °DM  labs:  °HbA1C: °Recent Labs  °  12/01/20 °0458  °HGBA1C 6.2*  ° °  °  °CBG (last 3)  °No results for input(s): GLUCAP in the last 72 hours. °  ° °    °Cultures: °   °Component Value Date/Time  ° SDES URINE, CLEAN CATCH 08/05/2017 1344  °  SPECREQUEST NONE 08/05/2017 1344  ° CULT  08/05/2017 1344  °  NO GROWTH °Performed at [facility], [address] [place], [place] [address] °  ° REPTSTATUS 08/06/2017 FINAL 08/05/2017 1344  ° °  °Radiological Exams on Admission: °CT ABDOMEN PELVIS WO CONTRAST ° °Result Date: 04/01/2021 °CLINICAL DATA:  Gastric cancer. Abdominal pain and cramping. Nausea vomiting and diarrhea. EXAM: CT ABDOMEN AND PELVIS WITHOUT CONTRAST TECHNIQUE: Multidetector CT imaging of the abdomen and pelvis was performed following the standard protocol without IV contrast. RADIATION DOSE REDUCTION: This exam was performed according to the departmental dose-optimization program which includes automated exposure control, adjustment of the mA and/or kV according to patient size and/or use of iterative reconstruction technique. COMPARISON:  12/01/2020 FINDINGS: Lower chest: Unremarkable. Hepatobiliary: No suspicious focal abnormality in the liver on this study without intravenous contrast. There is no evidence for gallstones, gallbladder wall thickening, or pericholecystic fluid. No intrahepatic or extrahepatic biliary dilation. Pancreas: No focal mass lesion. No dilatation of the main duct. No intraparenchymal cyst. No peripancreatic edema. Spleen: No splenomegaly. No focal mass lesion. Adrenals/Urinary Tract: Nodular thickening of the right adrenal gland is stable. 16 mm left adrenal nodule on 20/2 is low density compatible with adenoma, unchanged in the interval. Kidneys unremarkable. No evidence for hydroureter. The urinary bladder appears normal for the degree of distention. Stomach/Bowel: Stomach is unremarkable. No gastric wall thickening. No evidence of outlet obstruction. Duodenum is normally positioned as is the ligament of Treitz. No small bowel wall thickening. No small bowel dilatation. The terminal ileum is normal. The appendix is normal. No gross colonic mass. No colonic wall thickening. A few scattered left colonic  diverticuli noted without diverticulitis. Vascular/Lymphatic: There is mild atherosclerotic calcification of the abdominal aorta without aneurysm. There is no gastrohepatic or hepatoduodenal ligament lymphadenopathy. No retroperitoneal or mesenteric lymphadenopathy. No pelvic sidewall lymphadenopathy. Reproductive: Fiducial markers noted in the prostate gland. Other: No intraperitoneal free fluid. Musculoskeletal: Right groin hernia contains only fat. Small fat containing umbilical hernia evident. No worrisome lytic or sclerotic osseous abnormality. Bilateral pars interarticularis defects noted at L4. 10 mm   anterolisthesis of L4 on 5 is stable. IMPRESSION: 1. No acute findings in the abdomen or pelvis. Specifically, no evidence for metastatic disease in the abdomen or pelvis. 2. Stable 16 mm left adrenal adenoma. 3. Small fat containing hernias in the right groin and umbilical region. 4. Bilateral pars interarticularis defects at L4 with 10 mm anterolisthesis of L4 on 5, stable. 5. Aortic Atherosclerosis (ICD10-I70.0). Electronically Signed   By: Eric  Mansell M.D.   On: 04/01/2021 17:35  ° °DG Chest Port 1 View ° °Result Date: 04/01/2021 °CLINICAL DATA:  Weakness, shortness of breath. History of cancer with last chemotherapy on 03/22/2021. EXAM: PORTABLE CHEST 1 VIEW COMPARISON:  Chest radiograph 12/23/2020. FINDINGS: Right power injectable port central venous catheter tip projects at the level of the mid SVC. Left chest wall pacemaker with leads overlying the right atrium and right ventricle. Lungs are clear. No pleural effusion or pneumothorax. Cardiomegaly, mildly decreased compared to prior exam on 12/23/2020. IMPRESSION: No acute cardiopulmonary abnormality. Cardiomegaly again demonstrated, though less severe compared to prior radiograph 12/23/2020. Electronically Signed   By: Laura  Parra M.D.   On: 04/01/2021 15:51    °_______________________________________________________________________________________________________ °Latest  Blood pressure 108/80, pulse 73, temperature 99 °F (37.2 °C), temperature source Rectal, resp. rate 20, height 5' 6" (1.676 m), weight 88.9 kg, SpO2 100 %. ° ° Vitals  labs and radiology finding personally reviewed  °Review of Systems:  ° ° Pertinent positives include:  , fatigue, ° °Constitutional:  °No weight loss, night sweats, Fevers, chills weight loss  °HEENT:  °No headaches, Difficulty swallowing,Tooth/dental problems,Sore throat,  °No sneezing, itching, ear ache, nasal congestion, post nasal drip,  °Cardio-vascular:  °No chest pain, Orthopnea, PND, anasarca, dizziness, palpitations.no Bilateral lower extremity swelling  °GI:  °No heartburn, indigestion, abdominal pain, nausea, vomiting, diarrhea, change in bowel habits, loss of appetite, melena, blood in stool, hematemesis °Resp:  °no shortness of breath at rest. No dyspnea on exertion, No excess mucus, no productive cough, No non-productive cough, No coughing up of blood.No change in color of mucus.No wheezing. °Skin:  °no rash or lesions. No jaundice °GU:  °no dysuria, change in color of urine, no urgency or frequency. No straining to urinate.  °No flank pain.  °Musculoskeletal:  °No joint pain or no joint swelling. No decreased range of motion. No back pain.  °Psych:  °No change in mood or affect. No depression or anxiety. No memory loss.  °Neuro: no localizing neurological complaints, no tingling, no weakness, no double vision, no gait abnormality, no slurred speech, no confusion ° °All systems reviewed and apart from HOPI all are negative °_______________________________________________________________________________________________ °Past Medical History: °  °Past Medical History:  °Diagnosis Date  ° AICD (automatic cardioverter/defibrillator) present 05/25/2016  ° biv icd  ° Bunion, right   ° Chronic combined systolic and diastolic CHF  (congestive heart failure) (HCC)   ° Echo 1/18: Mild conc LVH, EF 15-20, severe diff HK, inf and inf-septal AK, Gr 3 DD, mild to mod MR, severe LAE, mod reduced RVSF, mod RAE, mild TR, PASP 50  ° Coronary artery disease involving native coronary artery without angina pectoris 04/17/2016  ° LHC 1/18: pLCx 30, mLCx 20, mRCA 40, dRCA 20, LVEDP 23, mean RA 8, PA 42/20, PCWP 17  ° Diabetes mellitus without complication (HCC)   ° DJD (degenerative joint disease)   ° History of atrial fibrillation   ° History of atrial flutter   ° History of cardiomegaly 06/07/2016  ° Noted on CXR  ° History   History of colon polyps 06/28/2017   Noted on colonoscopy   LBBB (left bundle branch block)    NICM (nonischemic cardiomyopathy) ([place])    Echo 1/18:  Mild conc LVH, EF 15-20, severe diff HK, inf and inf-septal AK, Gr 3 DD, mild to mod MR, severe LAE, mod reduced RVSF, mod RAE, mild TR, PASP 50   OA (osteoarthritis)    knee   Other secondary pulmonary hypertension ([place]) 04/17/2016   Prostate cancer ([place]) 2019   Sigmoid diverticulosis 06/28/2017   Noted on colonoscopy      Past Surgical History:  Procedure Laterality Date   BIOPSY  12/01/2020   Procedure: BIOPSY;  Surgeon: [name], MD;  Location: [facility];  Service: Endoscopy;;   BIV ICD INSERTION CRT-D N/A 05/25/2016   Procedure: BiV ICD Insertion CRT-D;  Surgeon: [name], MD;  Location: [facility];  Service: Cardiovascular;  Laterality: N/A;   CARDIAC CATHETERIZATION N/A 03/02/2016   Procedure: Right/Left Heart Cath and Coronary Angiography;  Surgeon: Nelva Bush, MD;  Location: [facility];  Service: Cardiovascular;  Laterality: N/A;   CARDIOVERSION N/A 07/17/2016   Procedure: Cardioversion;  Surgeon: [name], MD;  Location: [facility];  Service: Cardiovascular;  Laterality: N/A;   COLONOSCOPY WITH PROPOFOL N/A 06/28/2017   Procedure: COLONOSCOPY WITH PROPOFOL;  Surgeon: [name], MD;  Location: [facility];  Service:  Endoscopy;  Laterality: N/A;   colonscopy  2009   ESOPHAGOGASTRODUODENOSCOPY N/A 12/01/2020   Procedure: ESOPHAGOGASTRODUODENOSCOPY (EGD);  Surgeon: [name], MD;  Location: [facility];  Service: Endoscopy;  Laterality: N/A;  IDA/guaiac positive stools   ESOPHAGOGASTRODUODENOSCOPY (EGD) WITH PROPOFOL N/A 12/16/2020   Procedure: ESOPHAGOGASTRODUODENOSCOPY (EGD) WITH PROPOFOL;  Surgeon: [name], MD;  Location: [facility];  Service: Endoscopy;  Laterality: N/A;   GOLD SEED IMPLANT N/A 12/24/2017   Procedure: GOLD SEED IMPLANT, TRANSERINEAL;  Surgeon: Festus Aloe, MD;  Location: [facility];  Service: Urology;  Laterality: N/A;   INSERT / REPLACE / REMOVE PACEMAKER     LEAD REVISION  10/10/2018   LEAD REVISION/REPAIR N/A 10/10/2018   Procedure: LEAD REVISION/REPAIR;  Surgeon: [name], MD;  Location: [facility];  Service: Cardiovascular;  Laterality: N/A;   POLYPECTOMY  06/28/2017   Procedure: POLYPECTOMY;  Surgeon: [name], MD;  Location: [facility];  Service: Endoscopy;;  ascending and descending colon polyp   PORTACATH PLACEMENT Right 12/23/2020   Procedure: INSERTION PORT-A-CATH;  Surgeon: [name], MD;  Location: [facility];  Service: General;  Laterality: Right;   PROSTATE BIOPSY  02/20/2017   SPACE OAR INSTILLATION N/A 12/24/2017   Procedure: SPACE OAR INSTILLATION;  Surgeon: Festus Aloe, MD;  Location: [facility];  Service: Urology;  Laterality: N/A;   TOTAL KNEE ARTHROPLASTY Right 08/16/2017   Procedure: RIGHT TOTAL KNEE ARTHROPLASTY;  Surgeon: [name], MD;  Location: [facility];  Service: Orthopedics;  Laterality: Right;   UPPER ESOPHAGEAL ENDOSCOPIC ULTRASOUND (EUS) N/A 12/16/2020   Procedure: UPPER ESOPHAGEAL ENDOSCOPIC ULTRASOUND (EUS);  Surgeon: [name], MD;  Location: [facility];  Service: Endoscopy;  Laterality: N/A;    Social History:  Ambulatory   independently       reports that he has never smoked. He has never used  smokeless tobacco. He reports that he does not drink alcohol and does not use drugs.     Family History:   Family History  Problem Relation Age of Onset   Hypertension Mother    Heart disease  Diabetes Mother   ° Diabetes Father   ° Hypertension Father   ° Cancer Father   °     lung cancer  ° Healthy Sister   ° Heart attack Brother   ° Heart disease Brother 72  °     + tobacco  ° Healthy Brother   ° °______________________________________________________________________________________________ °Allergies: °Allergies  °Allergen Reactions  ° Aspirin Anaphylaxis and Hives  ° Sulfa Antibiotics Anaphylaxis and Hives  ° Other Other (See Comments)  °  ° °Prior to Admission medications   °Medication Sig Start Date End Date Taking? Authorizing Provider  °atorvastatin (LIPITOR) 40 MG tablet TAKE 1 TABLET(40 MG) BY MOUTH DAILY °Patient taking differently: Take 40 mg by mouth daily. 11/04/20  Yes [name], Gregg W, MD  °carvedilol (COREG) 3.125 MG tablet TAKE 1 TABLET(3.125 MG) BY MOUTH TWICE DAILY °Patient taking differently: Take 3.125 mg by mouth 2 (two) times daily with a meal. 11/04/20  Yes [name], Gregg W, MD  °cholecalciferol (VITAMIN D3) 25 MCG (1000 UT) tablet Take 1,000 Units by mouth daily with lunch.    Yes [provider]  °dexamethasone (DECADRON) 4 MG tablet Take 2 tablets (8 mg total) by mouth daily. Take 1 tab daily starting the day after chemotherapy for 2 days. Take with food. 12/20/20  Yes Feng, Yan, MD  °docusate sodium (COLACE) 100 MG capsule Take 1 capsule (100 mg total) by mouth 2 (two) times daily as needed for mild constipation. 01/06/21  Yes Pickering, Nathan, MD  °furosemide (LASIX) 40 MG tablet TAKE 1 TABLET(40 MG) BY MOUTH TWICE DAILY °Patient taking differently: Take 40 mg by mouth 2 (two) times daily. 03/16/21  Yes [name], Gregg W, MD  °lidocaine-prilocaine (EMLA) cream Apply to affected area once 12/20/20  Yes Feng, Yan, MD  °metFORMIN (GLUCOPHAGE-XR) 500 MG 24 hr tablet Take  500 mg by mouth every evening. 06/13/20  Yes [provider]  °Multiple Vitamin (MULTIVITAMIN WITH MINERALS) TABS tablet Take 1 tablet by mouth daily with lunch. One-A-Day   Yes [provider]  °ondansetron (ZOFRAN) 8 MG tablet Take 1 tablet (8 mg total) by mouth 2 (two) times daily as needed for refractory nausea / vomiting. Start on day 3 after chemotherapy. 12/20/20  Yes Feng, Yan, MD  °pantoprazole (PROTONIX) 40 MG tablet Take 1 tablet (40 mg total) by mouth 2 (two) times daily. 12/02/20  Yes Alekh, Kshitiz, MD  °potassium chloride (KLOR-CON) 10 MEQ tablet TAKE 1 TABLET(10 MEQ) BY MOUTH TWICE DAILY °Patient taking differently: Take 10 mEq by mouth 2 (two) times daily. 12/05/20  Yes [name], Gregg W, MD  °prochlorperazine (COMPAZINE) 10 MG tablet Take 1 tablet (10 mg total) by mouth every 6 (six) hours as needed (Nausea or vomiting). 02/15/21  Yes Feng, Yan, MD  °sacubitril-valsartan (ENTRESTO) 24-26 MG Take 1 tablet by mouth 2 (two) times daily. 12/21/20  Yes [name], Gregg W, MD  °sotalol (BETAPACE) 80 MG tablet Take 1.5 tablets (120 mg total) by mouth in the morning and at bedtime. 03/01/21  Yes [name], Gregg W, MD  °Naphazoline HCl (CLEAR EYES OP) Place 1 drop into both eyes daily as needed (irritation). °Patient not taking: Reported on 04/01/2021    [provider]  °polyethylene glycol (MIRALAX / GLYCOLAX) 17 g packet Take 17 g by mouth daily as needed for moderate constipation. °Patient not taking: Reported on 04/01/2021 01/06/21   Pickering, Nathan, MD  ° ° °___________________________________________________________________________________________________ °Physical Exam: °Vitals with BMI 04/01/2021 04/01/2021 04/01/2021  °Height - - -  °  Weight - - -  BMI - - -  Systolic 093 - 94  Diastolic 80 - 62  Pulse 73 73 78     1. General:  in No  Acute distress    Chronically ill   -appearing 2. Psychological: Alert and   Oriented 3. Head/ENT:    Dry Mucous Membranes                           Head Non traumatic, neck supple                          Poor Dentition 4. SKIN: decreased Skin turgor,  Skin clean Dry and intact no rash 5. Heart: Regular rate and rhythm no  Murmur, no Rub or gallop 6. Lungs:  Clear to auscultation bilaterally, no wheezes or crackles   7. Abdomen: Soft,  non-tender, Non distended bowel sounds present 8. Lower extremities: no clubbing, cyanosis, no  edema 9. Neurologically Grossly intact, moving all 4 extremities equally   10. MSK: Normal range of motion    Chart has been reviewed  ______________________________________________________________________________________________  Assessment/Plan  71 y.o. male with medical history significant of systolic CHF EF 23-55%, gastric cancer status receiving chemotherapy CAD, atrial fibrillation, diabetes   Admitted for  sepsis, dehydration   Present on Admission:  SIRS (systemic inflammatory response syndrome) (HCC)  Diarrhea  Occult blood in stools  Chronic combined systolic and diastolic CHF (congestive heart failure) (HCC)  LBBB (left bundle branch block)  Coronary artery disease involving native coronary artery without angina pectoris  Persistent atrial fibrillation (HCC)  Gastric cancer (HCC)  Iron deficiency anemia due to chronic blood loss  Foot ulcer, right (HCC)  Hypotension  Dehydration     SIRS (systemic inflammatory response syndrome) ([place])  -SIRS criteria met with  elevated white blood cell count,       Component Value Date/Time   WBC 24.0 (H) 04/01/2021 1430   LYMPHSABS 0.9 04/01/2021 1430   LYMPHSABS 0.8 04/25/2018 1601    RR >20 brief tachycardia Today's Vitals   04/01/21 1435 04/01/21 1440 04/01/21 1530 04/01/21 1730  BP: 94/62 94/62  108/80  Pulse:  78 73 73  Resp: _0 Temp:  99 F (37.2 C)    TempSrc:  Rectal    SpO2:  96% 100% 100%  Weight:      Height:      PainSc:        The recent clinical data is shown below. Vitals:   04/01/21 1435 04/01/21 1440  04/01/21 1530 04/01/21 1730  BP: 94/62 94/62  108/80  Pulse:  78 73 73  Resp: _1 Temp:  99 F (37.2 C)    TempSrc:  Rectal    SpO2:  96% 100% 100%  Weight:      Height:         -Most likely source being:  intra-abdominal,   Patient meeting criteria for Severe sepsis with    evidence of end organ damage/organ dysfunction such as   Acute Kidney Injury with Cr > 2,  Lab Results  Component Value Date   CREATININE 2.49 (H) 04/01/2021   CREATININE 1.33 (H) 03/22/2021     elevated lactic acid >2     Component Value Date/Time   LATICACIDVEN 3.7 (HH) 04/01/2021 1430         Component Value Date/Time   PLT 114 (L)  04/01/2021 1430  ° PLT 173 10/08/2018 0756  °  - Obtain serial lactic acid and procalcitonin level. ° - Initiated IV antibiotics in ER: °Antibiotics Given (last 72 hours)   ° ° Date/Time Action Medication Dose Rate  ° 04/01/21 1619 New Bag/Given  ° metroNIDAZOLE (FLAGYL) IVPB 500 mg 500 mg 100 mL/hr  ° 04/01/21 1621 New Bag/Given  ° ceFEPIme (MAXIPIME) 2 g in sodium chloride 0.9 % 100 mL IVPB 2 g 200 mL/hr  ° °  ° ° °Will continue   ° ° - await results of blood and urine culture ° - Rehydrate aggressively  °Intravenous fluids were administered, . ° °   °7:05 PM ° ° °Diarrhea °Order stool studies.  Appreciate GI involvement CT abdomen unremarkable ° °Occult blood in stools °GI Dr. Armbruster is aware continue to monitor CBC. °Denies any history of significant GI blood loss ° °Protonix IV twice daily ° n.p.o. postmidnight ° °Chronic combined systolic and diastolic CHF (congestive heart failure) (HCC) °Hold diuretics administer gentle IV fluids monitor fluid status ° °LBBB (left bundle branch block) °Chronic stable ° °Coronary artery disease involving native coronary artery without angina pectoris °Resume statin when able to tolerate currently chest pain-free ° ° °Persistent atrial fibrillation (HCC) °No longer on anticoagulation hold °Coreg given hypotension ° °Gastric cancer  (HCC) °Followed by Dr. Fain emailed that patient has been admitted °Currently receiving chemotherapy recently had Neupogen ° °Iron deficiency anemia due to chronic blood loss °Chronic currently appears to be stable continue to monitor ° °Foot ulcer, right (HCC) °Followed by podiatry appears to be noninfected at this time °Order wound care consult ° °Hypotension °Somewhat responsive to IV fluids suspect partially secondary to dehydration °Worsening lactic acid discussed with PCCM appreciate their consult. °Obtained venous O2 saturation from port which was above 50 suggesting less consistent with cardiogenic shock °Continue antibiotics gentle fluids as needed to maintain blood pressure °Patient with history of severe CHF with EF of 20% ° °Dehydration °Rehydrate °While being mindful of severe CHF ° ° °Other plan as per orders. ° °DVT prophylaxis:  SCD   °  °Code Status: DNR/DNI  as per patient   °I had personally discussed CODE STATUS with patient and family  °  °Family Communication:   Family   at  Bedside ° plan of care was discussed   with   Wife  ° °Disposition Plan:  To home once workup is complete and patient is stable  ° Following barriers for discharge: °                           Electrolytes corrected °                              °                            Pain controlled with PO medications °                             °                          Will need consultants to evaluate patient prior to discharge °  ° °                     Would benefit from PT/OT eval prior to DC  Ordered  °Consults called:  GI, and email .oncology, PCCM made aware ° °Admission status:  °ED Disposition   ° ° ED Disposition  °Admit  ° Condition  °--  ° Comment  °Hospital Area: [facility] [[id number]] ° Level of Care: Progressive [102] ° Admit to Progressive based on following criteria: MULTISYSTEM THREATS such as stable sepsis, metabolic/electrolyte imbalance with or without encephalopathy that is responding to  early treatment. ° May admit patient to [facility] or [place] if equivalent level of care is available:: No ° Covid Evaluation: Asymptomatic Screening Protocol (No Symptoms) ° Diagnosis: SIRS (systemic inflammatory response syndrome) (HCC) [382997] ° Admitting Physician: DOUTOVA, ANASTASSIA [[id number]] ° Attending Physician: DOUTOVA, ANASTASSIA [[id number]] ° Estimated length of stay: past midnight tomorrow ° Certification:: I certify this patient will need inpatient services for at least 2 midnights °  °  ° °  ° ° ° inpatient    ° °I Expect 2 midnight stay secondary to severity of patient's current illness need for inpatient interventions justified by the following: ° hemodynamic instability despite optimal treatment (tachycardia   °  Severe lab/radiological/exam abnormalities including:   ° AKIA °and extensive comorbidities including: ° DM2  ° CHF ° CAD   malignancy, °  ° °That are currently affecting medical management. ° ° °I expect  patient to be hospitalized for 2 midnights requiring inpatient medical care. ° °Patient is at high risk for adverse outcome (such as loss of life or disability) if not treated. ° °Indication for inpatient stay as follows:  °Severe change from baseline regarding mental status °Hemodynamic instability despite maximal medical therapy,  °ongoing suicidal ideations,  °severe pain requiring acute inpatient management,  °inability to maintain oral hydration  ° persistent chest pain despite medical management °Need for operative/procedural  intervention °New or worsening hypoxia ° ° °Need for IV antibiotics, IV fluids, IV rate controling medications, IV antihypertensives, IV pain medications, IV anticoagulation, need for biPAP ° ° ° Level of care   progressive tele indefinitely please discontinue once patient no longer qualifies °COVID-19 Labs °  ° °Lab Results  °Component Value Date  ° SARSCOV2NAA NEGATIVE 11/30/2020  °  ° Precautions: admitted as  asymptomatic screening protocol  °   °Anastassia  Doutova °04/01/2021, 11:08 PM   ° °[facility]  °  ° °after 2 AM please page floor coverage PA °If 7AM-7PM, please contact the day team taking care of the patient using °[email or web address]  ° °Patient was evaluated in the context of the global COVID-19 pandemic, which necessitated consideration that the patient might be at risk for infection with the SARS-CoV-2 virus that causes COVID-19. Institutional protocols and algorithms that pertain to the evaluation of patients at risk for COVID-19 are in a state of rapid change based on information released by regulatory bodies including the CDC and federal and state organizations. These policies and algorithms were followed during the patient's care.  °  °  °

## 2021-04-01 NOTE — Assessment & Plan Note (Addendum)
Followed by podiatry appears to be noninfected at this time Order wound care consult

## 2021-04-01 NOTE — Assessment & Plan Note (Signed)
Order stool studies.  Appreciate GI involvement CT abdomen unremarkable

## 2021-04-01 NOTE — ED Notes (Signed)
Lactic acid 3.7, Kelsey PA notified.

## 2021-04-01 NOTE — Assessment & Plan Note (Signed)
Resume statin when able to tolerate currently chest pain-free

## 2021-04-01 NOTE — ED Provider Notes (Signed)
[facility] DEPT Provider Note   CSN: [id number] Arrival date & time: 04/01/21  1419     History  Chief Complaint  Patient presents with   Emesis   Diarrhea   Weakness    Jon Williams is a 71 y.o. male.  Jon Williams is a 71 y.o. male with a history of gastric cancer, nonischemic cardiomyopathy with EF of 20-25%, mitral regurg, CAD, A-fib, diabetes, who presents to the emergency department for evaluation of 2 days of vomiting and diarrhea.  Patient is currently on a chemotherapy regimen for his gastric cancer including fluorouracil, Leucovorin, oxaliplatin and docetaxel.  He received this on 2/8 and also recently received a Neulasta injection.  Reports with his prior chemotherapy cycles he has not had significant diarrhea but for the past 2 days has been having several loose bowel movements.  He has not seen any bright red blood in the stool but does report stool has been a bit dark.  He also reports vomiting but denies any coffee-ground emesis or hematemesis.  No fevers.  Reports he has been feeling very weak and fatigued and has had very little energy.  Reports some associated central abdominal pain.  No chest pain.  No other aggravating or alleviating factors.  The history is provided by the patient, the spouse and medical records.       Home Medications Prior to Admission medications   Medication Sig Start Date End Date Taking? Authorizing Provider  atorvastatin (LIPITOR) 40 MG tablet TAKE 1 TABLET(40 MG) BY MOUTH DAILY 11/04/20   [name], MD  carvedilol (COREG) 3.125 MG tablet TAKE 1 TABLET(3.125 MG) BY MOUTH TWICE DAILY 11/04/20   [name], MD  cholecalciferol (VITAMIN D3) 25 MCG (1000 UT) tablet Take 1,000 Units by mouth daily with lunch.     [provider]  dexamethasone (DECADRON) 4 MG tablet Take 2 tablets (8 mg total) by mouth daily. Take 1 tab daily starting the day after chemotherapy for 2 days. Take with food.  12/20/20   Truitt Merle, MD  docusate sodium (COLACE) 100 MG capsule Take 1 capsule (100 mg total) by mouth 2 (two) times daily as needed for mild constipation. 01/06/21   Davonna Belling, MD  furosemide (LASIX) 40 MG tablet TAKE 1 TABLET(40 MG) BY MOUTH TWICE DAILY 03/16/21   [name], MD  lidocaine-prilocaine (EMLA) cream Apply to affected area once 12/20/20   Truitt Merle, MD  metFORMIN (GLUCOPHAGE-XR) 500 MG 24 hr tablet Take 500 mg by mouth every evening. 06/13/20   [provider]  Multiple Vitamin (MULTIVITAMIN WITH MINERALS) TABS tablet Take 1 tablet by mouth daily with lunch. One-A-Day    [provider]  Naphazoline HCl (CLEAR EYES OP) Place 1 drop into both eyes daily as needed (irritation).    [provider]  ondansetron (ZOFRAN) 4 MG tablet Take 1 tablet (4 mg total) by mouth every 6 (six) hours as needed for nausea. 12/02/20   Aline August, MD  ondansetron (ZOFRAN) 8 MG tablet Take 1 tablet (8 mg total) by mouth 2 (two) times daily as needed for refractory nausea / vomiting. Start on day 3 after chemotherapy. 12/20/20   Truitt Merle, MD  pantoprazole (PROTONIX) 40 MG tablet Take 1 tablet (40 mg total) by mouth 2 (two) times daily. 12/02/20   Aline August, MD  polyethylene glycol (MIRALAX / GLYCOLAX) 17 g packet Take 17 g by mouth daily as needed for moderate constipation. 01/06/21  Davonna Belling, MD  potassium chloride (KLOR-CON) 10 MEQ tablet TAKE 1 TABLET(10 MEQ) BY MOUTH TWICE DAILY 12/05/20   [name], MD  prochlorperazine (COMPAZINE) 10 MG tablet Take 1 tablet (10 mg total) by mouth every 6 (six) hours as needed (Nausea or vomiting). 02/15/21   Truitt Merle, MD  sacubitril-valsartan (ENTRESTO) 24-26 MG Take 1 tablet by mouth 2 (two) times daily. 12/21/20   [name], MD  sotalol (BETAPACE) 80 MG tablet Take 1.5 tablets (120 mg total) by mouth in the morning and at bedtime. 03/01/21   [name], MD      Allergies    Aspirin, Sulfa  antibiotics, and Other    Review of Systems   Review of Systems  Constitutional:  Positive for fatigue. Negative for chills and fever.  HENT: Negative.    Respiratory:  Negative for cough and shortness of breath.   Cardiovascular:  Negative for chest pain.  Gastrointestinal:  Positive for abdominal pain, diarrhea, nausea and vomiting. Negative for blood in stool and constipation.  Genitourinary:  Negative for dysuria and frequency.  Musculoskeletal:  Negative for arthralgias and myalgias.  Neurological:  Positive for weakness (Generalized) and light-headedness. Negative for headaches.  All other systems reviewed and are negative.  Physical Exam Updated Vital Signs BP 94/62 (BP Location: Left Arm)    Pulse 78    Temp 99 F (37.2 C) (Rectal)    Resp 18    Ht 5\' 6"  (1.676 m)    Wt 88.9 kg    SpO2 96%    BMI 31.64 kg/m  Physical Exam Vitals and nursing note reviewed.  Constitutional:      General: He is not in acute distress.    Appearance: Normal appearance. He is well-developed. He is ill-appearing. He is not diaphoretic.  HENT:     Head: Normocephalic and atraumatic.     Mouth/Throat:     Mouth: Mucous membranes are dry.     Pharynx: Oropharynx is clear.  Eyes:     General:        Right eye: No discharge.        Left eye: No discharge.  Cardiovascular:     Rate and Rhythm: Normal rate and regular rhythm.     Pulses: Normal pulses.     Heart sounds: Normal heart sounds.  Pulmonary:     Effort: Pulmonary effort is normal. No respiratory distress.     Breath sounds: Normal breath sounds. No wheezing or rales.     Comments: Respirations equal and unlabored, patient able to speak in full sentences, lungs clear to auscultation bilaterally  Abdominal:     General: Bowel sounds are normal. There is no distension.     Palpations: Abdomen is soft. There is no mass.     Tenderness: There is abdominal tenderness. There is no guarding.     Comments: Abdomen soft, nondistended, bowel  sounds present throughout, mild central abdominal tenderness, all other quadrants nontender, no focal guarding or rebound tenderness  Musculoskeletal:        General: No deformity.     Cervical back: Neck supple.     Comments: Chronic appearing wound to the right foot  Skin:    General: Skin is warm and dry.     Capillary Refill: Capillary refill takes less than 2 seconds.  Neurological:     Mental Status: He is alert and oriented to person, place, and time.     Coordination: Coordination normal.  Comments: Speech is clear, able to follow commands Moves extremities without ataxia, coordination intact  Psychiatric:        Mood and Affect: Mood normal.        Behavior: Behavior normal.    ED Results / Procedures / Treatments   Labs (all labs ordered are listed, but only abnormal results are displayed) Labs Reviewed  COMPREHENSIVE METABOLIC PANEL - Abnormal; Notable for the following components:      Result Value   Sodium 134 (*)    Glucose, Bld 133 (*)    Creatinine, Ser 2.49 (*)    AST 46 (*)    GFR, Estimated 27 (*)    All other components within normal limits  CBC WITH DIFFERENTIAL/PLATELET - Abnormal; Notable for the following components:   WBC 24.0 (*)    RBC 4.05 (*)    Hemoglobin 12.5 (*)    HCT 38.5 (*)    RDW 21.2 (*)    Platelets 114 (*)    nRBC 0.6 (*)    Neutro Abs 20.6 (*)    Monocytes Absolute 1.6 (*)    Abs Immature Granulocytes 0.69 (*)    All other components within normal limits  LIPASE, BLOOD - Abnormal; Notable for the following components:   Lipase 66 (*)    All other components within normal limits  LACTIC ACID, PLASMA - Abnormal; Notable for the following components:   Lactic Acid, Venous 3.7 (*)    All other components within normal limits  POC OCCULT BLOOD, ED - Abnormal; Notable for the following components:   Fecal Occult Bld POSITIVE (*)    All other components within normal limits  CULTURE, BLOOD (ROUTINE X 2)  CULTURE, BLOOD (ROUTINE  X 2)  RESP PANEL BY RT-PCR (FLU A&B, COVID) ARPGX2  C DIFFICILE QUICK SCREEN W PCR REFLEX    GASTROINTESTINAL PANEL BY PCR, STOOL (REPLACES STOOL CULTURE)  MAGNESIUM  LACTIC ACID, PLASMA  URINALYSIS, ROUTINE W REFLEX MICROSCOPIC  TYPE AND SCREEN    EKG None  Radiology CT ABDOMEN PELVIS WO CONTRAST  Result Date: 04/01/2021 CLINICAL DATA:  Gastric cancer. Abdominal pain and cramping. Nausea vomiting and diarrhea. EXAM: CT ABDOMEN AND PELVIS WITHOUT CONTRAST TECHNIQUE: Multidetector CT imaging of the abdomen and pelvis was performed following the standard protocol without IV contrast. RADIATION DOSE REDUCTION: This exam was performed according to the departmental dose-optimization program which includes automated exposure control, adjustment of the mA and/or kV according to patient size and/or use of iterative reconstruction technique. COMPARISON:  12/01/2020 FINDINGS: Lower chest: Unremarkable. Hepatobiliary: No suspicious focal abnormality in the liver on this study without intravenous contrast. There is no evidence for gallstones, gallbladder wall thickening, or pericholecystic fluid. No intrahepatic or extrahepatic biliary dilation. Pancreas: No focal mass lesion. No dilatation of the main duct. No intraparenchymal cyst. No peripancreatic edema. Spleen: No splenomegaly. No focal mass lesion. Adrenals/Urinary Tract: Nodular thickening of the right adrenal gland is stable. 16 mm left adrenal nodule on 20/2 is low density compatible with adenoma, unchanged in the interval. Kidneys unremarkable. No evidence for hydroureter. The urinary bladder appears normal for the degree of distention. Stomach/Bowel: Stomach is unremarkable. No gastric wall thickening. No evidence of outlet obstruction. Duodenum is normally positioned as is the ligament of Treitz. No small bowel wall thickening. No small bowel dilatation. The terminal ileum is normal. The appendix is normal. No gross colonic mass. No colonic wall  thickening. A few scattered left colonic diverticuli noted without diverticulitis. Vascular/Lymphatic: There is mild atherosclerotic  calcification of the abdominal aorta without aneurysm. There is no gastrohepatic or hepatoduodenal ligament lymphadenopathy. No retroperitoneal or mesenteric lymphadenopathy. No pelvic sidewall lymphadenopathy. Reproductive: Fiducial markers noted in the prostate gland. Other: No intraperitoneal free fluid. Musculoskeletal: Right groin hernia contains only fat. Small fat containing umbilical hernia evident. No worrisome lytic or sclerotic osseous abnormality. Bilateral pars interarticularis defects noted at L4. 10 mm anterolisthesis of L4 on 5 is stable. IMPRESSION: 1. No acute findings in the abdomen or pelvis. Specifically, no evidence for metastatic disease in the abdomen or pelvis. 2. Stable 16 mm left adrenal adenoma. 3. Small fat containing hernias in the right groin and umbilical region. 4. Bilateral pars interarticularis defects at L4 with 10 mm anterolisthesis of L4 on 5, stable. 5. Aortic Atherosclerosis (ICD10-I70.0). Electronically Signed   By: Misty Stanley M.D.   On: 04/01/2021 17:35   DG Chest Port 1 View  Result Date: 04/01/2021 CLINICAL DATA:  Weakness, shortness of breath. History of cancer with last chemotherapy on 03/22/2021. EXAM: PORTABLE CHEST 1 VIEW COMPARISON:  Chest radiograph 12/23/2020. FINDINGS: Right power injectable port central venous catheter tip projects at the level of the mid SVC. Left chest wall pacemaker with leads overlying the right atrium and right ventricle. Lungs are clear. No pleural effusion or pneumothorax. Cardiomegaly, mildly decreased compared to prior exam on 12/23/2020. IMPRESSION: No acute cardiopulmonary abnormality. Cardiomegaly again demonstrated, though less severe compared to prior radiograph 12/23/2020. Electronically Signed   By: Ileana Roup M.D.   On: 04/01/2021 15:51    Procedures .Critical Care Performed by: Jacqlyn Larsen, PA-C Authorized by: Jacqlyn Larsen, PA-C   Critical care provider statement:    Critical care time (minutes):  45   Critical care was necessary to treat or prevent imminent or life-threatening deterioration of the following conditions:  Sepsis   Critical care was time spent personally by me on the following activities:  Development of treatment plan with patient or surrogate, discussions with consultants, evaluation of patient's response to treatment, examination of patient, ordering and review of laboratory studies, ordering and review of radiographic studies, ordering and performing treatments and interventions, pulse oximetry, re-evaluation of patient's condition and review of old charts   Care discussed with: admitting provider      Medications Ordered in ED Medications  pantoprazole (PROTONIX) injection 40 mg (has no administration in time range)  lactated ringers bolus 500 mL (0 mLs Intravenous Stopped 04/01/21 1738)  ceFEPIme (MAXIPIME) 2 g in sodium chloride 0.9 % 100 mL IVPB (0 g Intravenous Stopped 04/01/21 1722)  metroNIDAZOLE (FLAGYL) IVPB 500 mg (0 mg Intravenous Stopped 04/01/21 1732)    ED Course/ Medical Decision Making/ A&P                           Medical Decision Making  This patient presents to the ED for concern of diarrhea, vomiting, abdominal pain and weakness, this involves an extensive number of treatment options, and is a complaint that carries with it a high risk of complications and morbidity.  The differential diagnosis includes colitis, cholecystitis, bowel perforation, obstruction, bleeding gastric ulcer, bleeding from gastric cancer, side effects from chemotherapy   Co morbidities that complicate the patient evaluation  Gastric cancer Nonischemic cardiomyopathy with EF 20-25% and severe mitral regurg   Additional history obtained:  Additional history obtained from spouse at bedside External records from outside source obtained and reviewed  including recent oncology notes, last chemotherapy 2/8  Lab Tests:  I Ordered, and personally interpreted labs.  The pertinent results include: Leukocytosis of 24, could be due to infection, also may be falsely increased due to recent Neulasta injection, hemoglobin stable, lactic acid is elevated at 3.7, may be due to infection and/or dehydration, acute kidney injury with creatinine of 2.49, prior baseline around 1.33, no other significant electrolyte derangements, despite reported vomiting and diarrhea, potassium is normal.  Lipase mildly elevated.  Blood cultures, COVID, flu, urinalysis in process.  Hemoccult is positive.   Imaging Studies ordered:  I ordered imaging studies including chest x-ray and CT abdomen pelvis, CT head to be done without contrast due to AKI I independently visualized and interpreted imaging which showed known cardiomegaly without evidence of pneumonia or other acute cardiopulmonary disease.  CT without evidence of colitis, or perforation.  More detailed interpretation done by radiology as noted above I agree with the radiologist interpretation   Cardiac Monitoring:  The patient was maintained on a cardiac monitor.  I personally viewed and interpreted the cardiac monitored which showed an underlying rhythm of: Normal sinus rhythm   Medicines ordered and prescription drug management:  I ordered medication including small IV fluid bolus given significantly reduced EF, blood pressure responded well, broad-spectrum IV antibiotics for intra-abdominal source ordered, IV Protonix given GI bleeding Reevaluation of the patient after these medicines showed that the patient improved I have reviewed the patients home medicines and have made adjustments as needed   Critical Interventions:  IV fluids and broad-spectrum antibiotics   Consultations Obtained:  I requested consultation with the hospitalist for admission,  and discussed lab and imaging findings as well as  pertinent plan -Dr. Roel Cluck will see and admit the patient. I requested nonemergent consultation with GI, patient is followed by Dr. Benson Norway, Dr. Havery Moros covering this weekend, requested consultation for potential GI bleeding  Problem List / ED Course:  Mild hypotension on arrival, likely multifactorial, I definitely think dehydration is contributing, concern for potential infection as well.  Blood pressure responded with small fluid bolus, wanted to avoid aggressive hydration given significantly reduced EF.  Patient also given broad-spectrum antibiotics. Stool studies ordered, patient has not had a bowel movement, did have heme positive stools but with no gross blood visible CT without acute abnormality, chest x-ray clear.  Blood cultures and urinalysis pending Discussed plan for admission with patient and wife at bedside and they are in agreement   Reevaluation:  After the interventions noted above, I reevaluated the patient and found that they have :improved    Dispostion:  After consideration of the diagnostic results and the patients response to treatment, I feel that the patent would benefit from admission.          Final Clinical Impression(s) / ED Diagnoses Final diagnoses:  SIRS (systemic inflammatory response syndrome) ([facility])  AKI (acute kidney injury) ([place])  Nausea vomiting and diarrhea    Rx / DC Orders ED Discharge Orders     None         Jacqlyn Larsen, [place] 04/01/21 1847    Varney Biles, MD 04/02/21 1418

## 2021-04-01 NOTE — Sepsis Progress Note (Signed)
[facility] is following this code sepsis ?

## 2021-04-01 NOTE — Assessment & Plan Note (Signed)
Chronic currently appears to be stable continue to monitor

## 2021-04-01 NOTE — Assessment & Plan Note (Signed)
Rehydrate While being mindful of severe CHF

## 2021-04-01 NOTE — Telephone Encounter (Signed)
Looks like there is nothing that can be done to improve blood flow. He can come in next couple of weeks and we can decide how we can help him. No rush coming in

## 2021-04-01 NOTE — Sepsis Progress Note (Signed)
Current bedside RN aware of need to collect ordered lactic

## 2021-04-01 NOTE — Assessment & Plan Note (Signed)
GI Dr. Havery Moros is aware continue to monitor CBC. Denies any history of significant GI blood loss  Protonix IV twice daily  n.p.o. postmidnight

## 2021-04-01 NOTE — ED Triage Notes (Signed)
Pt c/o abd pain/cramping, pt states vomiting and diarrhea x2 days.  Pt states symptoms started last night. Pt denies sick contacts.  Wife at bedside.  Pt hx CA last chemo 03/22/21.

## 2021-04-02 ENCOUNTER — Inpatient Hospital Stay (HOSPITAL_COMMUNITY): Payer: Medicare HMO

## 2021-04-02 DIAGNOSIS — E860 Dehydration: Secondary | ICD-10-CM

## 2021-04-02 DIAGNOSIS — I4819 Other persistent atrial fibrillation: Secondary | ICD-10-CM

## 2021-04-02 DIAGNOSIS — E876 Hypokalemia: Secondary | ICD-10-CM

## 2021-04-02 DIAGNOSIS — R6510 Systemic inflammatory response syndrome (SIRS) of non-infectious origin without acute organ dysfunction: Secondary | ICD-10-CM | POA: Diagnosis not present

## 2021-04-02 DIAGNOSIS — I421 Obstructive hypertrophic cardiomyopathy: Secondary | ICD-10-CM | POA: Diagnosis not present

## 2021-04-02 DIAGNOSIS — I959 Hypotension, unspecified: Secondary | ICD-10-CM

## 2021-04-02 DIAGNOSIS — R112 Nausea with vomiting, unspecified: Secondary | ICD-10-CM

## 2021-04-02 DIAGNOSIS — I5042 Chronic combined systolic (congestive) and diastolic (congestive) heart failure: Secondary | ICD-10-CM | POA: Diagnosis not present

## 2021-04-02 DIAGNOSIS — R195 Other fecal abnormalities: Secondary | ICD-10-CM

## 2021-04-02 DIAGNOSIS — Z95810 Presence of automatic (implantable) cardiac defibrillator: Secondary | ICD-10-CM

## 2021-04-02 DIAGNOSIS — I255 Ischemic cardiomyopathy: Secondary | ICD-10-CM

## 2021-04-02 DIAGNOSIS — R197 Diarrhea, unspecified: Secondary | ICD-10-CM

## 2021-04-02 DIAGNOSIS — D696 Thrombocytopenia, unspecified: Secondary | ICD-10-CM

## 2021-04-02 LAB — CBC
HCT: 35.8 % — ABNORMAL LOW (ref 39.0–52.0)
Hemoglobin: 11.4 g/dL — ABNORMAL LOW (ref 13.0–17.0)
MCH: 30.7 pg (ref 26.0–34.0)
MCHC: 31.8 g/dL (ref 30.0–36.0)
MCV: 96.5 fL (ref 80.0–100.0)
Platelets: 103 10*3/uL — ABNORMAL LOW (ref 150–400)
RBC: 3.71 MIL/uL — ABNORMAL LOW (ref 4.22–5.81)
RDW: 21.1 % — ABNORMAL HIGH (ref 11.5–15.5)
WBC: 19.3 10*3/uL — ABNORMAL HIGH (ref 4.0–10.5)
nRBC: 0.3 % — ABNORMAL HIGH (ref 0.0–0.2)

## 2021-04-02 LAB — CBC WITH DIFFERENTIAL/PLATELET
Abs Immature Granulocytes: 0.33 10*3/uL — ABNORMAL HIGH (ref 0.00–0.07)
Basophils Absolute: 0 10*3/uL (ref 0.0–0.1)
Basophils Relative: 0 %
Eosinophils Absolute: 0 10*3/uL (ref 0.0–0.5)
Eosinophils Relative: 0 %
HCT: 34.8 % — ABNORMAL LOW (ref 39.0–52.0)
Hemoglobin: 11.3 g/dL — ABNORMAL LOW (ref 13.0–17.0)
Immature Granulocytes: 2 %
Lymphocytes Relative: 5 %
Lymphs Abs: 0.7 10*3/uL (ref 0.7–4.0)
MCH: 31.1 pg (ref 26.0–34.0)
MCHC: 32.5 g/dL (ref 30.0–36.0)
MCV: 95.9 fL (ref 80.0–100.0)
Monocytes Absolute: 1.1 10*3/uL — ABNORMAL HIGH (ref 0.1–1.0)
Monocytes Relative: 7 %
Neutro Abs: 13.5 10*3/uL — ABNORMAL HIGH (ref 1.7–7.7)
Neutrophils Relative %: 86 %
Platelets: 97 10*3/uL — ABNORMAL LOW (ref 150–400)
RBC: 3.63 MIL/uL — ABNORMAL LOW (ref 4.22–5.81)
RDW: 21.2 % — ABNORMAL HIGH (ref 11.5–15.5)
WBC: 15.7 10*3/uL — ABNORMAL HIGH (ref 4.0–10.5)
nRBC: 0.4 % — ABNORMAL HIGH (ref 0.0–0.2)

## 2021-04-02 LAB — COMPREHENSIVE METABOLIC PANEL
ALT: 25 U/L (ref 0–44)
AST: 34 U/L (ref 15–41)
Albumin: 3.4 g/dL — ABNORMAL LOW (ref 3.5–5.0)
Alkaline Phosphatase: 54 U/L (ref 38–126)
Anion gap: 9 (ref 5–15)
BUN: 20 mg/dL (ref 8–23)
CO2: 23 mmol/L (ref 22–32)
Calcium: 8.5 mg/dL — ABNORMAL LOW (ref 8.9–10.3)
Chloride: 104 mmol/L (ref 98–111)
Creatinine, Ser: 1.69 mg/dL — ABNORMAL HIGH (ref 0.61–1.24)
GFR, Estimated: 43 mL/min — ABNORMAL LOW (ref >60)
Glucose, Bld: 94 mg/dL (ref 70–99)
Potassium: 3.1 mmol/L — ABNORMAL LOW (ref 3.5–5.1)
Sodium: 136 mmol/L (ref 135–145)
Total Bilirubin: 0.8 mg/dL (ref 0.3–1.2)
Total Protein: 6 g/dL — ABNORMAL LOW (ref 6.5–8.1)

## 2021-04-02 LAB — ECHOCARDIOGRAM COMPLETE
AR max vel: 1.4 cm2
AV Area VTI: 1.18 cm2
AV Area mean vel: 1.42 cm2
AV Mean grad: 2 mmHg
AV Peak grad: 4.5 mmHg
Ao pk vel: 1.06 m/s
Area-P 1/2: 5.54 cm2
Calc EF: 25 %
Height: 66 in
MV VTI: 0.15 cm2
S' Lateral: 5 cm
Single Plane A2C EF: 36.1 %
Single Plane A4C EF: 14.2 %
Weight: 3019.42 oz

## 2021-04-02 LAB — BLOOD GAS, VENOUS
Acid-Base Excess: 0.6 mmol/L (ref 0.0–2.0)
Bicarbonate: 24.5 mmol/L (ref 20.0–28.0)
O2 Saturation: 73.3 %
Patient temperature: 37.1
pCO2, Ven: 36 mmHg — ABNORMAL LOW (ref 44–60)
pH, Ven: 7.44 — ABNORMAL HIGH (ref 7.25–7.43)
pO2, Ven: 45 mmHg (ref 32–45)

## 2021-04-02 LAB — GLUCOSE, CAPILLARY
Glucose-Capillary: 102 mg/dL — ABNORMAL HIGH (ref 70–99)
Glucose-Capillary: 110 mg/dL — ABNORMAL HIGH (ref 70–99)
Glucose-Capillary: 125 mg/dL — ABNORMAL HIGH (ref 70–99)
Glucose-Capillary: 89 mg/dL (ref 70–99)
Glucose-Capillary: 95 mg/dL (ref 70–99)
Glucose-Capillary: 99 mg/dL (ref 70–99)

## 2021-04-02 LAB — LACTIC ACID, PLASMA
Lactic Acid, Venous: 1.1 mmol/L (ref 0.5–1.9)
Lactic Acid, Venous: 1.3 mmol/L (ref 0.5–1.9)
Lactic Acid, Venous: 1.6 mmol/L (ref 0.5–1.9)

## 2021-04-02 LAB — HEMOGLOBIN A1C
Hgb A1c MFr Bld: 6.3 % — ABNORMAL HIGH (ref 4.8–5.6)
Mean Plasma Glucose: 134.11 mg/dL

## 2021-04-02 LAB — RESP PANEL BY RT-PCR (FLU A&B, COVID) ARPGX2
Influenza A by PCR: NEGATIVE
Influenza B by PCR: NEGATIVE
SARS Coronavirus 2 by RT PCR: NEGATIVE

## 2021-04-02 LAB — C DIFFICILE QUICK SCREEN W PCR REFLEX
C Diff antigen: NEGATIVE
C Diff interpretation: NOT DETECTED
C Diff toxin: NEGATIVE

## 2021-04-02 LAB — TROPONIN I (HIGH SENSITIVITY)
Troponin I (High Sensitivity): 10 ng/L (ref <18)
Troponin I (High Sensitivity): 10 ng/L (ref <18)

## 2021-04-02 LAB — MAGNESIUM: Magnesium: 1.7 mg/dL (ref 1.7–2.4)

## 2021-04-02 LAB — OSMOLALITY: Osmolality: 289 mOsm/kg (ref 275–295)

## 2021-04-02 LAB — PHOSPHORUS: Phosphorus: 4.4 mg/dL (ref 2.5–4.6)

## 2021-04-02 LAB — OSMOLALITY, URINE: Osmolality, Ur: 458 mOsm/kg (ref 300–900)

## 2021-04-02 MED ORDER — SODIUM CHLORIDE 0.9 % IV SOLN
INTRAVENOUS | Status: DC | PRN
Start: 2021-04-02 — End: 2021-04-06

## 2021-04-02 MED ORDER — CEFEPIME HCL 2 G IJ SOLR
2.0000 g | Freq: Two times a day (BID) | INTRAMUSCULAR | Status: DC
Start: 2021-04-02 — End: 2021-04-03
  Administered 2021-04-02 (×2): 2 g via INTRAVENOUS
  Filled 2021-04-02 (×2): qty 2

## 2021-04-02 NOTE — Progress Notes (Addendum)
PROGRESS NOTE    Jon Williams  PJA:[id number] DOB: 1950/11/19 DOA: 04/01/2021 PCP: [name], MD     Brief Narrative:  H/o CHF, gastric cancer, last chemo 03/22/21 c/o abd pain/cramping,  vomiting and diarrhea x2 days.  Decreased oral intake, no fever, denies sick contacts.    Subjective:  He is sitting up in chair, denies pain, no nausea, no vomiting, no fever Report feeling better Wife at bedside  Assessment & Plan:  Principal Problem:   SIRS (systemic inflammatory response syndrome) (HCC) Active Problems:   Chronic combined systolic and diastolic CHF (congestive heart failure) (HCC)   LBBB (left bundle branch block)   Coronary artery disease involving native coronary artery without angina pectoris   Persistent atrial fibrillation (HCC)   Gastric cancer (HCC)   Iron deficiency anemia due to chronic blood loss   Diarrhea   Occult blood in stools   Foot ulcer, right (HCC)   Hypotension   Dehydration    Assessment and Plan: No notes have been filed under this hospital service. Service: Hospitalist  Severe hypotension -No apparent source of infection, currently on empiric antibiotics to cover GI source -Seen by critical care, Point-of-care ultrasound on IVC  shows nearly 100% collapsible,  BNP is lower than previously noted.  No peripheral edema or pulmonary edema, Venous sat off of the patient's port (which would be central) is 58.1% which is not entirely consistent with cardiogenic shock -All home blood pressure medication held, appear responded by fluids resuscitation, most likely hypovolemic hypotension  Lactic acidosis Possibly from tissue-poor perfusion from severe hypotension No overt source of infection  Significant leukocytosis Likely combination of dehydration and possible effect from G-CSF ( received on 2/9) -Repeat CBC in the morning  Thrombocytopenia Form viral infection versus from chemo Monitor  Iron deficiency anemia due to chronic blood  loss  Chronic currently appears to be stable continue to monitor   Nausea /vomiting /diarrhea/heme positive stool C. difficile negative GI PCR panel in process Continue empiric antibiotic to cover GI source, low threshold to discontinue antibiotics Seen by GI, no intervention planned Appear improving, advance diet as tolerated  AKI Likely from hypotension and dehydration UA unremarkable No hydronephrosis on CT scan Creatinine improving on hydration Repeat BMP in the morning Renal dosing meds  Hypokalemia Replace K  Chronic combined systolic and diastolic CHF (congestive heart failure) (HCC) /CAD/ischemic cardiomyopathy s/p Biv ICD in 2018/ Persistent atrial fibrillation (HCC)  Denies chest pain , present with dehydration /severe hypotension, chest x-ray clear , no edema on exam  Hold Coreg, sotalol ,Lasix, Entresto, resume when BP and renal function improves Not on anticoagulation due to GI cancer Continue statin  Noninsulin-dependent type 2 diabetes Home metformin held On SSI   Foot ulcer, right ([place])  Followed by podiatry appears to be noninfected at this time   wound care consult  Gastric cancer ([place])  Last chemo on 2/8  CT on admission did not show any metastatic disease    I have Reviewed nursing notes, Vitals, pain scores, I/o's, Lab results and  imaging results since pt's last encounter, details please see discussion above  I ordered the following labs:  Unresulted Labs (From admission, onward)     Start     Ordered   04/03/21 0500  CBC with Differential/Platelet  Tomorrow morning,   R       Question:  Specimen collection method  Answer:  Unit=Unit collect   04/02/21 0908   04/03/21 3419  Basic metabolic panel  Tomorrow morning,   R       Question:  Specimen collection method  Answer:  Unit=Unit collect   04/02/21 0908   04/03/21 0500  Magnesium  Tomorrow morning,   R       Question:  Specimen collection method  Answer:  Unit=Unit collect   04/02/21 0908    04/01/21 1935  Urine Culture  Once,   R       Question:  Indication  Answer:  Sepsis   04/01/21 1934   04/01/21 1612  C Difficile Quick Screen w PCR reflex  (C Difficile quick screen w PCR reflex panel )  Once, for 24 hours,   STAT       References:    CDiff Information Tool   04/01/21 1611   04/01/21 1612  Gastrointestinal Panel by PCR , Stool  (Gastrointestinal Panel by PCR, Stool                                                                                                                                                     **Does Not include CLOSTRIDIUM DIFFICILE testing. **If CDIFF testing is needed, place order from the "C Difficile Testing" order set.**)  Once,   STAT        04/01/21 1611             DVT prophylaxis: SCDs Start: 04/01/21 2154   Code Status:   Code Status: DNR  Family Communication: Wife at bedside Disposition:   Status is: Inpatient  Dispo: The patient is from: Home              Anticipated d/c is to: Home, may need home health              Anticipated d/c date is: Greater than 24 hours  Antimicrobials:   Anti-infectives (From admission, onward)    Start     Dose/Rate Route Frequency Ordered Stop   04/02/21 1600  ceFEPIme (MAXIPIME) 2 g in sodium chloride 0.9 % 100 mL IVPB  Status:  Discontinued        2 g 200 mL/hr over 30 Minutes Intravenous Every 24 hours 04/01/21 1940 04/02/21 1332   04/02/21 1430  ceFEPIme (MAXIPIME) 2 g in sodium chloride 0.9 % 100 mL IVPB        2 g 200 mL/hr over 30 Minutes Intravenous Every 12 hours 04/02/21 1332     04/02/21 0600  metroNIDAZOLE (FLAGYL) IVPB 500 mg        500 mg 100 mL/hr over 60 Minutes Intravenous Every 12 hours 04/01/21 1918 04/09/21 0559   04/01/21 1615  ceFEPIme (MAXIPIME) 2 g in sodium chloride 0.9 % 100 mL IVPB        2 g 200 mL/hr over 30 Minutes Intravenous  Once 04/01/21 1611 04/01/21 1722   04/01/21 1615  metroNIDAZOLE (FLAGYL) IVPB 500 mg        500 mg 100 mL/hr over 60 Minutes  Intravenous  Once 04/01/21 1611 04/01/21 1732   04/01/21 1530  ceFEPIme (MAXIPIME) 2 g in sodium chloride 0.9 % 100 mL IVPB  Status:  Discontinued        2 g 200 mL/hr over 30 Minutes Intravenous  Once 04/01/21 1528 04/01/21 1534   04/01/21 1530  metroNIDAZOLE (FLAGYL) IVPB 500 mg  Status:  Discontinued        500 mg 100 mL/hr over 60 Minutes Intravenous  Once 04/01/21 1528 04/01/21 1534           Objective: Vitals:   04/02/21 1100 04/02/21 1145 04/02/21 1300 04/02/21 1610  BP: 99/69  117/77   Pulse:   84   Resp: 14  14   Temp:  97.6 F (36.4 C)  (!) 97.3 F (36.3 C)  TempSrc:  Oral  Oral  SpO2:   100%   Weight:      Height:        Intake/Output Summary (Last 24 hours) at 04/02/2021 1847 Last data filed at 04/02/2021 1200 Gross per 24 hour  Intake 694.2 ml  Output 400 ml  Net 294.2 ml   Filed Weights   04/01/21 1434 04/01/21 2130  Weight: 88.9 kg 85.6 kg    Examination:  General exam: alert, awake, communicative,calm, NAD Respiratory system: Clear to auscultation. Respiratory effort normal. Cardiovascular system:  RRR.  Gastrointestinal system: Abdomen is nondistended, soft and nontender.  Normal bowel sounds heard. Central nervous system: Alert and oriented. No focal neurological deficits. Extremities:  no edema Skin: No rashes, lesions or ulcers Psychiatry: Judgement and insight appear normal. Mood & affect appropriate.     Data Reviewed: I have personally reviewed  labs and visualized  imaging studies since the last encounter and formulate the plan        Scheduled Meds:  atorvastatin  40 mg Oral Daily   Chlorhexidine Gluconate Cloth  6 each Topical QHS   insulin aspart  0-9 Units Subcutaneous Q4H   mouth rinse  15 mL Mouth Rinse BID   pantoprazole (PROTONIX) IV  40 mg Intravenous Q12H   Continuous Infusions:  sodium chloride 10 mL/hr at 04/02/21 1100   ceFEPime (MAXIPIME) IV Stopped (04/02/21 1716)   metronidazole 500 mg (04/02/21 1812)      LOS: 1 day     Florencia Reasons, MD PhD FACP [facility]  Available via Epic secure chat 7am-7pm for nonurgent issues Please page for urgent issues To page the attending provider between 7A-7P or the covering provider during after hours 7P-7A, please log into the web site [email or web address] and access using universal  password for that web site. If you do not have the password, please call the hospital operator.    04/02/2021, 6:47 PM

## 2021-04-02 NOTE — Evaluation (Signed)
Physical Therapy Evaluation Patient Details Name: Jon Williams MRN: [id number] DOB: [birth year] Today's Date: 04/02/2021  History of Present Illness  Pt admitted from home with weakness, dehydration, diarrhea, sepsis, SIRS adn with hx of CHF, DM, DJD, afib, cardiomegly, nonischemic cardiomyopathy, AICD, R TKR, LBBB, prostate CE and gastric CA with last chemo 03/22/21  Clinical Impression  Pt admitted as above and presenting with functional mobility limitations 2* generalized weakness, decreased endurance and balance deficits.  This date, pt up to ambulate limited distance into hallway but fatigues easily and requiring assist for all tasks.  Pt motivated to dc home with family and would benefit from follow up HHPT to address deficits and maximize IND and safety.     Recommendations for follow up therapy are one component of a multi-disciplinary discharge planning process, led by the attending physician.  Recommendations may be updated based on patient status, additional functional criteria and insurance authorization.  Follow Up Recommendations Home health PT    Assistance Recommended at Discharge Frequent or constant Supervision/Assistance  Patient can return home with the following  A little help with walking and/or transfers;A little help with bathing/dressing/bathroom;Assistance with cooking/housework;Assist for transportation;Help with stairs or ramp for entrance    Equipment Recommendations Rolling walker (2 wheels)  Recommendations for Other Services       Functional Status Assessment Patient has had a recent decline in their functional status and demonstrates the ability to make significant improvements in function in a reasonable and predictable amount of time.     Precautions / Restrictions Precautions Precautions: Fall Restrictions Weight Bearing Restrictions: No      Mobility  Bed Mobility Overal bed mobility: Needs Assistance Bed Mobility: Supine to Sit     Supine  to sit: Min guard     General bed mobility comments: use of bed rail and min guard for safety    Transfers Overall transfer level: Needs assistance Equipment used: Rolling walker (2 wheels) Transfers: Sit to/from Stand Sit to Stand: Min assist, +2 physical assistance, +2 safety/equipment           General transfer comment: assist to bring wt up and fwd and to balance in initial standing    Ambulation/Gait Ambulation/Gait assistance: Min assist, +2 physical assistance, +2 safety/equipment Gait Distance (Feet): 20 Feet Assistive device: Rolling walker (2 wheels) Gait Pattern/deviations: Step-to pattern, Decreased step length - right, Decreased step length - left, Shuffle, Trunk flexed, Wide base of support Gait velocity: decr     General Gait Details: cues for posture and position from RW;  physical assist to manage RW and for balance; distance ltd by fatigue  Stairs            Wheelchair Mobility    Modified Rankin (Stroke Patients Only)       Balance Overall balance assessment: Needs assistance Sitting-balance support: Feet supported, No upper extremity supported Sitting balance-Leahy Scale: Good     Standing balance support: Bilateral upper extremity supported Standing balance-Leahy Scale: Poor                               Pertinent Vitals/Pain Pain Assessment Pain Assessment: 0-10 Pain Score: 10-Worst pain ever Pain Location: bilateral heels.    Home Living Family/patient expects to be discharged to:: Private residence Living Arrangements: Spouse/significant other;Non-relatives/Friends Available Help at Discharge: Family;Available 24 hours/day Type of Home: House Home Access: Stairs to enter Entrance Stairs-Rails: Right;Left;Can reach both Entrance Stairs-Number of Steps: 4  Home Layout: Other (Comment) Home Equipment: None      Prior Function Prior Level of Function : Independent/Modified Independent                      Hand Dominance   Dominant Hand: Right    Extremity/Trunk Assessment   Upper Extremity Assessment Upper Extremity Assessment: Defer to OT evaluation    Lower Extremity Assessment Lower Extremity Assessment: Generalized weakness    Cervical / Trunk Assessment Cervical / Trunk Assessment: Normal  Communication   Communication: No difficulties  Cognition Arousal/Alertness: Awake/alert Behavior During Therapy: WFL for tasks assessed/performed Overall Cognitive Status: Within Functional Limits for tasks assessed                                 General Comments: patient is oriented to date, self, and hospital.        General Comments      Exercises     Assessment/Plan    PT Assessment Patient needs continued PT services  PT Problem List Decreased strength;Decreased activity tolerance;Decreased balance;Decreased mobility;Decreased knowledge of use of DME;Pain       PT Treatment Interventions DME instruction;Gait training;Stair training;Functional mobility training;Therapeutic activities;Therapeutic exercise;Patient/family education    PT Goals (Current goals can be found in the Care Plan section)  Acute Rehab PT Goals Patient Stated Goal: Regain IND PT Goal Formulation: With patient Time For Goal Achievement: 04/16/21 Potential to Achieve Goals: Good    Frequency Min 3X/week     Co-evaluation PT/OT/SLP Co-Evaluation/Treatment: Yes Reason for Co-Treatment: For patient/therapist safety;To address functional/ADL transfers PT goals addressed during session: Mobility/safety with mobility OT goals addressed during session: ADL's and self-care       AM-PAC PT "6 Clicks" Mobility  Outcome Measure Help needed turning from your back to your side while in a flat bed without using bedrails?: None Help needed moving from lying on your back to sitting on the side of a flat bed without using bedrails?: A Little Help needed moving to and from a bed to a  chair (including a wheelchair)?: A Little Help needed standing up from a chair using your arms (e.g., wheelchair or bedside chair)?: A Little Help needed to walk in hospital room?: A Lot Help needed climbing 3-5 steps with a railing? : A Lot 6 Click Score: 17    End of Session Equipment Utilized During Treatment: Gait belt Activity Tolerance: Patient limited by fatigue Patient left: in chair;with call bell/phone within reach;with chair alarm set Nurse Communication: Mobility status PT Visit Diagnosis: Unsteadiness on feet (R26.81);Muscle weakness (generalized) (M62.81);Difficulty in walking, not elsewhere classified (R26.2);Pain Pain - part of body: Ankle and joints of foot    Time: 0847-0920 PT Time Calculation (min) (ACUTE ONLY): 33 min   Charges:   PT Evaluation $PT Eval Low Complexity: 1 Low          [place] Pager [phone] Office [phone]   Brie Eppard 04/02/2021, 1:05 PM

## 2021-04-02 NOTE — Progress Notes (Signed)
° °  Echocardiogram 2D Echocardiogram has been performed.  Jon Williams 04/02/2021, 10:24 AM

## 2021-04-02 NOTE — Progress Notes (Addendum)
NAME:  Jon Williams, MRN:  [id number], DOB:  1950/09/14, LOS: 1 ADMISSION DATE:  04/01/2021, CONSULTATION DATE:  04/01/2021 REFERRING MD:  Roel Cluck, CHIEF COMPLAINT:  Diarrhea   History of Present Illness:   Jon Williams is a 71 year old male with a medical history significant for systolic heart failure (EF of 20 to 25%), gastric cancer with ongoing chemotherapy, coronary artery disease, atrial fibrillation, diabetes who presented to [facility] long hospital with diarrhea and abdominal pain.  Patient notes that he has not really been able to eat.  Of note, the patient   received chemotherapy on 8 February.  He has been afebrile.  He denies any overt blood in his stools.  Notes some abdominal pain which is with cramps.  Denies any lightheadedness.  He does note that he has not really been able to eat or drink much.  On presentation to the ED, patient was tachycardic and hypotensive.  Since then, his tachycardia has resolved and his blood pressures have largely normalized to what they are regularly.  He is receiving cefepime and Flagyl.  Labs demonstrate a BNP of 381.  Negative procalcitonin.  Normal TSH.  BUN of 22 and creatinine 2.49.  Patient additionally has a leukocytosis of 14.2.  Critical care is being consulted given the patient's low-normal blood pressures and climbing lactic acidosis.  Pertinent  Medical History   HFrEF Gastric cancer with ongoing chemotherapy Coronary arterial disease Atrial fibrillation Diabetes  Significant Hospital Events: Including procedures, antibiotic start and stop dates in addition to other pertinent events   Started on cefepime and Flagyl 2/18   Scheduled Meds:  atorvastatin  40 mg Oral Daily   Chlorhexidine Gluconate Cloth  6 each Topical QHS   insulin aspart  0-9 Units Subcutaneous Q4H   mouth rinse  15 mL Mouth Rinse BID   pantoprazole (PROTONIX) IV  40 mg Intravenous Q12H   Continuous Infusions:  sodium chloride 10 mL/hr at 04/02/21 1100    ceFEPime (MAXIPIME) IV     metronidazole Stopped (04/02/21 0646)   PRN Meds:.sodium chloride, acetaminophen **OR** acetaminophen, HYDROcodone-acetaminophen    Objective   Blood pressure 117/77, pulse 84, temperature 97.6 F (36.4 C), temperature source Oral, resp. rate 14, height 5\' 6"  (1.676 m), weight 85.6 kg, SpO2 100 %.        Intake/Output Summary (Last 24 hours) at 04/02/2021 1456 Last data filed at 04/02/2021 1100 Gross per 24 hour  Intake 694.2 ml  Output --  Net 694.2 ml   Filed Weights   04/01/21 1434 04/01/21 2130  Weight: 88.9 kg 85.6 kg   Examination: Tmax  99 General appearance:    alert and pleasant, does not appear toxic  At Rest 02 sats  100% on RA  No jvd Oropharynx clear,  mucosa nl Neck supple Lungs clear  bilaterally RRR no s3 or or sign murmur Abd mildly distended, min tender with Pos BS but no stool yet  Extr warm with no edema or clubbing noted Neuro  Sensorium alert and approp,  no apparent motor deficits    I personally reviewed images and agree with radiology impression as follows:  CXR:   portable 2/18 No acute cardiopulmonary abnormality.  Cardiomegaly again demonstrated, though less severe compared to prior radiograph 12/23/2020.  Resolved Hospital Problem list   None  Assessment & Plan:   Hypotension> resolved 1/19  no pressors  Rising Lactic acidosis> resolved 1/19 -  ejection fraction of 25% - Presented with diarrhea, abdominal pain and decreased p.o. intake.  No fevers> po intake improving s N V D - Point-of-care ultrasound of the patient's IVC showed that nearly 100% collapsible pm 2/18 GIP panel and C. difficile testing pending  - Echo 2/18 EF 25 % mild LAE   AKI- resolving with fluids Lab Results  Component Value Date   CREATININE 1.69 (H) 04/02/2021   CREATININE 2.49 (H) 04/01/2021   CREATININE 1.33 (H) 03/22/2021      No further CCM issues, will sign off  Best Practice (right click and "Reselect all SmartList  Selections" daily)   Diet/type: clear liquids DVT prophylaxis: other GI prophylaxis: N/A Lines: N/A Foley:  N/A Code Status:  DNR  Labs   CBC: Recent Labs  Lab 04/01/21 1430 04/01/21 2330 04/02/21 0530  WBC 24.0* 19.3* 15.7*  NEUTROABS 20.6*  --  13.5*  HGB 12.5* 11.4* 11.3*  HCT 38.5* 35.8* 34.8*  MCV 95.1 96.5 95.9  PLT 114* 103* 97*    Basic Metabolic Panel: Recent Labs  Lab 04/01/21 1430 04/01/21 1918 04/02/21 0530  NA 134*  --  136  K 3.6  --  3.1*  CL 100  --  104  CO2 22  --  23  GLUCOSE 133*  --  94  BUN 22  --  20  CREATININE 2.49*  --  1.69*  CALCIUM 9.1  --  8.5*  MG 1.8  --  1.7  PHOS  --  4.0 4.4   GFR: Estimated Creatinine Clearance: 40.5 mL/min (A) (by C-G formula based on SCr of 1.69 mg/dL (H)). Recent Labs  Lab 04/01/21 1430 04/01/21 1746 04/01/21 1918 04/01/21 2330 04/01/21 2336 04/02/21 0031 04/02/21 0530  PROCALCITON  --   --  0.63  --   --   --   --   WBC 24.0*  --   --  19.3*  --   --  15.7*  LATICACIDVEN 3.7* 7.6*  --   --  1.6 1.3 1.1    Liver Function Tests: Recent Labs  Lab 04/01/21 1430 04/01/21 1918 04/02/21 0530  AST 46* 38 34  ALT 28 26 25   ALKPHOS 65 62 54  BILITOT 0.6 0.6 0.8  PROT 7.4 6.5 6.0*  ALBUMIN 4.2 3.8 3.4*   Recent Labs  Lab 04/01/21 1430  LIPASE 66*   No results for input(s): AMMONIA in the last 168 hours.  ABG    Component Value Date/Time   PHART 7.406 03/02/2016 1158   PCO2ART 34.5 03/02/2016 1158   PO2ART 83.0 03/02/2016 1158   HCO3 24.5 04/01/2021 2100   TCO2 23 03/02/2016 1158   ACIDBASEDEF 2.0 03/02/2016 1158   O2SAT 73.3 04/01/2021 2100     Coagulation Profile: No results for input(s): INR, PROTIME in the last 168 hours.  Cardiac Enzymes: Recent Labs  Lab 04/01/21 1918  CKTOTAL 163    HbA1C: Hgb A1c MFr Bld  Date/Time Value Ref Range Status  04/01/2021 08:55 PM 6.3 (H) 4.8 - 5.6 % Final    Comment:    (NOTE) Pre diabetes:          5.7%-6.4%  Diabetes:               >6.4%  Glycemic control for   <7.0% adults with diabetes   12/01/2020 04:58 AM 6.2 (H) 4.8 - 5.6 % Final    Comment:    (NOTE) Pre diabetes:          5.7%-6.4%  Diabetes:              >  6.4%  Glycemic control for   <7.0% adults with diabetes     CBG: Recent Labs  Lab 04/01/21 1958 04/01/21 2332 04/02/21 0325 04/02/21 0745 04/02/21 1227  GLUCAP 91 76 95 89 125*     Call if neeed Christinia Gully, MD Pulmonary and [place] [phone]   After 7:00 pm call Elink  [phone]

## 2021-04-02 NOTE — Progress Notes (Signed)
Pharmacy Antibiotic Note  Jon Williams is a 71 y.o. male admitted on 04/01/2021 with IAI.  Pharmacy has been consulted for cefepime dosing.  Today, patient is afebrile, WBC elevated but trending down, and serum creatinine down to 1.69.    Plan: Change Cefepime to 2 gm IV every 12 hours Flagyl 500 mg IV every 12 hours per provider Monitor clinical progress, renal function F/U C&S, abx deescalation / LOT   Height: 5\' 6"  (167.6 cm) Weight: 85.6 kg (188 lb 11.4 oz) IBW/kg (Calculated) : 63.8  Temp (24hrs), Avg:98 F (36.7 C), Min:97.6 F (36.4 C), Max:99 F (37.2 C)  Recent Labs  Lab 04/01/21 1430 04/01/21 1746 04/01/21 2330 04/01/21 2336 04/02/21 0031 04/02/21 0530  WBC 24.0*  --  19.3*  --   --  15.7*  CREATININE 2.49*  --   --   --   --  1.69*  LATICACIDVEN 3.7* 7.6*  --  1.6 1.3 1.1     Estimated Creatinine Clearance: 40.5 mL/min (A) (by C-G formula based on SCr of 1.69 mg/dL (H)).    Allergies  Allergen Reactions   Aspirin Anaphylaxis and Hives   Sulfa Antibiotics Anaphylaxis and Hives   Other Other (See Comments)    Thank you for allowing pharmacy to be a part of this patients care.  Royetta Asal, PharmD, BCPS Clinical Pharmacist [place] Please utilize Amion for appropriate phone number to reach the unit pharmacist ([name]) 04/02/2021 1:34 PM

## 2021-04-02 NOTE — Consult Note (Addendum)
GI CONSULT NOTE ([facility] covering for Dr. Benson Norway)  Reason for consultation: Nausea, vomiting, diarrhea, abdominal pain, and heme positive stool  HISTORY OF PRESENT ILLNESS:  Jon Williams is a 71 y.o. male with multiple significant medical problems including nonischemic cardiomyopathy status post AICD placement, diabetes mellitus, history of atrial fibrillation, prostate cancer, and adenocarcinoma of the stomach for which he is currently undergoing chemotherapy.  We are asked to see him regarding nausea, vomiting, diarrhea, abdominal pain, and heme positive stool.  His wife is in the hospital room.  Hospitalist present as well.  The patient underwent upper endoscopy to evaluate iron deficiency anemia December 01, 2020.  He was found to have a gastric cancer involving the incisor.  Biopsies revealed adenocarcinoma.  He subsequently underwent endoscopic ultrasound.  The lesion was deemed T3.  He began chemotherapy (FLOT 4) January 02, 2021.  He receives Neulasta as well.  He has tolerated his chemotherapy sessions.  He does get transient diarrhea at times which responds to antidiarrheals.  His last chemotherapy session was March 22, 2021.  He seemed fine until yesterday when he abruptly developed problems with nausea vomiting and diarrhea.  Some abdominal discomfort, but not severe.  No hematochezia or hematemesis.  He was evaluated in the emergency room.  Stool was Hemoccult positive.  CT scan of the abdomen and pelvis was unremarkable.  No gastric mass noted.  Nothing to suggest obstruction.  No inflammatory changes.  He did have a white blood cell count.  White blood cell count today is less at 15.7.  Hemoglobin 11.3.  Platelet count 97,000.  Potassium 3.1.  Albumin 3.4.  BUN 20 with creatinine 1.69.  The patient was transiently hypotensive.  He responded to fluids.  He tells me that he is feeling better today.  He has had no vomiting or diarrhea in 24 hours.  No new complaints.  Because he has not  had a bowel movement, stool studies have not been collected.  Patient's stools were brown.  No melena or hematochezia.  Hemoglobin stable.  He has had several colonoscopies.  Last colonoscopy Jun 28, 2017 with sigmoid diverticulosis and diminutive polyps.  REVIEW OF SYSTEMS:  All non-GI ROS negative except for right foot wound  Past Medical History:  Diagnosis Date   AICD (automatic cardioverter/defibrillator) present 05/25/2016   biv icd   Bunion, right    Chronic combined systolic and diastolic CHF (congestive heart failure) ([place])    Echo 1/18: Mild conc LVH, EF 15-20, severe diff HK, inf and inf-septal AK, Gr 3 DD, mild to mod MR, severe LAE, mod reduced RVSF, mod RAE, mild TR, PASP 50   Coronary artery disease involving native coronary artery without angina pectoris 04/17/2016   LHC 1/18: pLCx 51, mLCx 20, mRCA 40, dRCA 20, LVEDP 23, mean RA 8, PA 42/20, PCWP 17   Diabetes mellitus without complication (HCC)    DJD (degenerative joint disease)    History of atrial fibrillation    History of atrial flutter    History of cardiomegaly 06/07/2016   Noted on CXR   History of colon polyps 06/28/2017   Noted on colonoscopy   LBBB (left bundle branch block)    NICM (nonischemic cardiomyopathy) ([place])    Echo 1/18:  Mild conc LVH, EF 15-20, severe diff HK, inf and inf-septal AK, Gr 3 DD, mild to mod MR, severe LAE, mod reduced RVSF, mod RAE, mild TR, PASP 50   OA (osteoarthritis)    knee   Other  secondary pulmonary hypertension ([place]) 04/17/2016   Prostate cancer ([place]) 2019   Sigmoid diverticulosis 06/28/2017   Noted on colonoscopy    Past Surgical History:  Procedure Laterality Date   BIOPSY  12/01/2020   Procedure: BIOPSY;  Surgeon: [name], MD;  Location: [facility];  Service: Endoscopy;;   BIV ICD INSERTION CRT-D N/A 05/25/2016   Procedure: BiV ICD Insertion CRT-D;  Surgeon: [name], MD;  Location: [facility];  Service: Cardiovascular;  Laterality: N/A;    CARDIAC CATHETERIZATION N/A 03/02/2016   Procedure: Right/Left Heart Cath and Coronary Angiography;  Surgeon: Nelva Bush, MD;  Location: [facility];  Service: Cardiovascular;  Laterality: N/A;   CARDIOVERSION N/A 07/17/2016   Procedure: Cardioversion;  Surgeon: [name], MD;  Location: [facility];  Service: Cardiovascular;  Laterality: N/A;   COLONOSCOPY WITH PROPOFOL N/A 06/28/2017   Procedure: COLONOSCOPY WITH PROPOFOL;  Surgeon: [name], MD;  Location: [facility];  Service: Endoscopy;  Laterality: N/A;   colonscopy  2009   ESOPHAGOGASTRODUODENOSCOPY N/A 12/01/2020   Procedure: ESOPHAGOGASTRODUODENOSCOPY (EGD);  Surgeon: [name], MD;  Location: [facility];  Service: Endoscopy;  Laterality: N/A;  IDA/guaiac positive stools   ESOPHAGOGASTRODUODENOSCOPY (EGD) WITH PROPOFOL N/A 12/16/2020   Procedure: ESOPHAGOGASTRODUODENOSCOPY (EGD) WITH PROPOFOL;  Surgeon: [name], MD;  Location: [facility];  Service: Endoscopy;  Laterality: N/A;   GOLD SEED IMPLANT N/A 12/24/2017   Procedure: GOLD SEED IMPLANT, TRANSERINEAL;  Surgeon: Festus Aloe, MD;  Location: [facility];  Service: Urology;  Laterality: N/A;   INSERT / REPLACE / REMOVE PACEMAKER     LEAD REVISION  10/10/2018   LEAD REVISION/REPAIR N/A 10/10/2018   Procedure: LEAD REVISION/REPAIR;  Surgeon: [name], MD;  Location: [facility];  Service: Cardiovascular;  Laterality: N/A;   POLYPECTOMY  06/28/2017   Procedure: POLYPECTOMY;  Surgeon: [name], MD;  [facility];  Service: Endoscopy;;  ascending and descending colon polyp   PORTACATH PLACEMENT Right 12/23/2020   Procedure: INSERTION PORT-A-CATH;  Surgeon: [name], MD;  Location: [facility];  Service: General;  Laterality: Right;   PROSTATE BIOPSY  02/20/2017   SPACE OAR INSTILLATION N/A 12/24/2017   Procedure: SPACE OAR INSTILLATION;  Surgeon: Festus Aloe, MD;  Location: [facility];  Service: Urology;  Laterality:  N/A;   TOTAL KNEE ARTHROPLASTY Right 08/16/2017   Procedure: RIGHT TOTAL KNEE ARTHROPLASTY;  Surgeon: [name], MD;  Location: [facility];  Service: Orthopedics;  Laterality: Right;   UPPER ESOPHAGEAL ENDOSCOPIC ULTRASOUND (EUS) N/A 12/16/2020   Procedure: UPPER ESOPHAGEAL ENDOSCOPIC ULTRASOUND (EUS);  Surgeon: [name], MD;  Location: [facility];  Service: Endoscopy;  Laterality: N/A;    Social History Jon Williams  reports that he has never smoked. He has never used smokeless tobacco. He reports that he does not drink alcohol and does not use drugs.  family history includes Cancer in his father; Diabetes in his father and mother; Healthy in his brother and sister; Heart attack in his brother; Heart disease in his mother; Heart disease (age of onset: 67) in his brother; Hypertension in his father and mother.  Allergies  Allergen Reactions   Aspirin Anaphylaxis and Hives   Sulfa Antibiotics Anaphylaxis and Hives   Other Other (See Comments)       PHYSICAL EXAMINATION: Vital signs: BP (!) 83/49    Pulse 86    Temp 98 F (36.7 C) (Oral)    Resp 14    Ht  5\' 6"  (1.676 m)    Wt 85.6 kg    SpO2 100%    BMI 30.46 kg/m   Constitutional: generally well-appearing, no acute distress Psychiatric: alert and oriented x3, cooperative Eyes: extraocular movements intact, anicteric, conjunctiva pink Mouth: oral pharynx moist, no lesions Neck: supple no lymphadenopathy Cardiovascular: heart regular rate and rhythm, no murmur Lungs: clear to auscultation bilaterally Abdomen: soft, nontender, nondistended, no obvious ascites, no peritoneal signs, normal bowel sounds, no organomegaly Rectal: Omitted Extremities: no clubbing or cyanosis.  1+ lower extremity edema bilaterally Skin: no lesions on visible extremities Neuro: No focal deficits.  Cranial nerves intact  ASSESSMENT:  1.  Acute self-limited illness marked by nausea with vomiting, diarrhea, and mild abdominal discomfort.  Resolved.   This is most likely an acute self-limited viral illness.  I do not believe this is C. difficile. 2.  Dehydration.  Resolved 3.  Gastric cancer.  Undergoing treatment as outlined 4.  Multiple significant medical problems 5.  Heme positive stool without clinically relevant bleeding.  Stable hemoglobin.  Does not need worked up further.  Has had several colonoscopies (most recent 2019)   PLAN:  1.  Advance diet as tolerated 2.  Symptomatic therapies for nausea and diarrhea if needed. 3.  Probably would stop antibiotics if you cannot identify a bacterial infection 4.  Correct electrolytes per primary service 5.  Ongoing oncologic care with Dr. Burr Medico Discussed with patient and his wife.  GI will sign off.  However, if further assistance needed, Dr. Benson Norway will be available tomorrow.  Thank you.  Docia Chuck. Geri Seminole., M.D. [facility] Division of Gastroenterology

## 2021-04-02 NOTE — Evaluation (Signed)
Occupational Therapy Evaluation Patient Details Name: Jon Williams MRN: [id number] DOB: [birth year] Today's Date: 04/02/2021   History of Present Illness Pt admitted from home with weakness, dehydration, diarrhea, sepsis, SIRS adn with hx of CHF, DM, DJD, afib, cardiomegly, nonischemic cardiomyopathy, AICD, R TKR, LBBB, prostate CE and gastric CA with last chemo 03/22/21   Clinical Impression   Patient is a 71 year old male who was admitted for above. Patient was living at home with wife independently prior level. Currently, patient was noted to have decreased activity tolerance, increased pain in bilateral heels, decreased standing balance, decreased endurance, decreased cardiopulmomary endurance and decreased safety awareness impacting participation in ADLs. Patient would continue to benefit from skilled OT services at this time while admitted and after d/c to address noted deficits in order to improve overall safety and independence in ADLs.    Blood pressures:   Supine 102/62 mmhg Sitting 85/43 mmhg Sitting few mins: 95/77 mmhg Standing: 102/62 mmhg Walking 123/68 mmhg   Recommendations for follow up therapy are one component of a multi-disciplinary discharge planning process, led by the attending physician.  Recommendations may be updated based on patient status, additional functional criteria and insurance authorization.   Follow Up Recommendations  Home health OT    Assistance Recommended at Discharge Frequent or constant Supervision/Assistance  Patient can return home with the following A little help with walking and/or transfers;A little help with bathing/dressing/bathroom;Assistance with cooking/housework;Direct supervision/assist for financial management;Direct supervision/assist for medications management;Help with stairs or ramp for entrance;Assist for transportation    Functional Status Assessment  Patient has had a recent decline in their functional status and demonstrates  the ability to make significant improvements in function in a reasonable and predictable amount of time.  Equipment Recommendations  None recommended by OT    Recommendations for Other Services       Precautions / Restrictions Precautions Precautions: Fall Restrictions Weight Bearing Restrictions: No      Mobility Bed Mobility Overal bed mobility: Needs Assistance Bed Mobility: Supine to Sit     Supine to sit: Min guard     General bed mobility comments: use of bed rail and min guard for safety    Transfers                          Balance Overall balance assessment: Needs assistance Sitting-balance support: Feet supported, No upper extremity supported Sitting balance-Leahy Scale: Good     Standing balance support: Bilateral upper extremity supported, Reliant on assistive device for balance Standing balance-Leahy Scale: Poor                             ADL either performed or assessed with clinical judgement   ADL Overall ADL's : Needs assistance/impaired Eating/Feeding: Set up;Sitting   Grooming: Wash/dry face;Oral care;Set up;Sitting   Upper Body Bathing: Minimal assistance;Sitting   Lower Body Bathing: Maximal assistance;Sitting/lateral leans;Bed level   Upper Body Dressing : Minimal assistance;Sitting   Lower Body Dressing: Sitting/lateral leans;Bed level;Maximal assistance Lower Body Dressing Details (indicate cue type and reason): patient noted to have "boggy" heels with max A to don socks on this date. Toilet Transfer: Minimal assistance;+2 for physical assistance;+2 for safety/equipment Toilet Transfer Details (indicate cue type and reason): to transfer from edge of bed to RW with increased time. Toileting- Clothing Manipulation and Hygiene: Maximal assistance;Sit to/from stand       Functional mobility during ADLs: +  2 for safety/equipment;+2 for physical assistance;Minimal assistance       Vision Patient Visual Report: No  change from baseline       Perception     Praxis      Pertinent Vitals/Pain Pain Assessment Pain Assessment: 0-10 Pain Score: 10-Worst pain ever Pain Location: bilateral heels.     Hand Dominance Right   Extremity/Trunk Assessment Upper Extremity Assessment Upper Extremity Assessment: Generalized weakness   Lower Extremity Assessment Lower Extremity Assessment: Defer to PT evaluation   Cervical / Trunk Assessment Cervical / Trunk Assessment: Normal   Communication Communication Communication: No difficulties   Cognition Arousal/Alertness: Awake/alert Behavior During Therapy: WFL for tasks assessed/performed Overall Cognitive Status: Within Functional Limits for tasks assessed                                 General Comments: patient is oriented to date, self, and hospital.     General Comments       Exercises     Shoulder Instructions      Home Living Family/patient expects to be discharged to:: Private residence Living Arrangements: Spouse/significant other;Non-relatives/Friends Available Help at Discharge: Family;Available 24 hours/day Type of Home: House Home Access: Stairs to enter [employer] of Steps: 4 Entrance Stairs-Rails: Right;Left;Can reach both Home Layout: Other (Comment)     Bathroom Shower/Tub: Occupational psychologist: Standard     Home Equipment: None          Prior Functioning/Environment Prior Level of Function : Independent/Modified Independent                        OT Problem List: Decreased activity tolerance;Impaired balance (sitting and/or standing);Decreased safety awareness;Cardiopulmonary status limiting activity;Decreased knowledge of precautions;Decreased knowledge of use of DME or AE      OT Treatment/Interventions: Self-care/ADL training;Therapeutic exercise;Neuromuscular education;Energy conservation;DME and/or AE instruction;Therapeutic activities;Balance  training;Patient/family education    OT Goals(Current goals can be found in the care plan section) Acute Rehab OT Goals Patient Stated Goal: to get back home OT Goal Formulation: With patient Time For Goal Achievement: 04/16/21 Potential to Achieve Goals: Good  OT Frequency: Min 2X/week    Co-evaluation PT/OT/SLP Co-Evaluation/Treatment: Yes Reason for Co-Treatment: To address functional/ADL transfers PT goals addressed during session: Mobility/safety with mobility OT goals addressed during session: ADL's and self-care      AM-PAC OT "6 Clicks" Daily Activity     Outcome Measure Help from another person eating meals?: A Little Help from another person taking care of personal grooming?: A Little Help from another person toileting, which includes using toliet, bedpan, or urinal?: A Lot Help from another person bathing (including washing, rinsing, drying)?: A Lot Help from another person to put on and taking off regular upper body clothing?: A Lot Help from another person to put on and taking off regular lower body clothing?: A Lot 6 Click Score: 14   End of Session Equipment Utilized During Treatment: Gait belt;Rolling walker (2 wheels) Nurse Communication: Mobility status  Activity Tolerance: Patient tolerated treatment well Patient left: in chair;with call bell/phone within reach;with nursing/sitter in room  OT Visit Diagnosis: Unsteadiness on feet (R26.81);Other abnormalities of gait and mobility (R26.89)                Time: 2778-2423 OT Time Calculation (min): 36 min Charges:  OT General Charges $OT Visit: 1 Visit OT Evaluation $OT Eval Moderate Complexity:  [place] OTR/L, [place] Acute Rehabilitation Department Office# [phone] Pager# [phone]   Marcellina Millin 04/02/2021, 1:35 PM

## 2021-04-03 DIAGNOSIS — E119 Type 2 diabetes mellitus without complications: Secondary | ICD-10-CM

## 2021-04-03 DIAGNOSIS — L97519 Non-pressure chronic ulcer of other part of right foot with unspecified severity: Secondary | ICD-10-CM

## 2021-04-03 DIAGNOSIS — R110 Nausea: Secondary | ICD-10-CM

## 2021-04-03 DIAGNOSIS — E11621 Type 2 diabetes mellitus with foot ulcer: Secondary | ICD-10-CM

## 2021-04-03 DIAGNOSIS — N179 Acute kidney failure, unspecified: Principal | ICD-10-CM

## 2021-04-03 DIAGNOSIS — R6510 Systemic inflammatory response syndrome (SIRS) of non-infectious origin without acute organ dysfunction: Secondary | ICD-10-CM | POA: Diagnosis not present

## 2021-04-03 DIAGNOSIS — I5042 Chronic combined systolic (congestive) and diastolic (congestive) heart failure: Secondary | ICD-10-CM

## 2021-04-03 DIAGNOSIS — E8721 Acute metabolic acidosis: Secondary | ICD-10-CM

## 2021-04-03 LAB — CBC WITH DIFFERENTIAL/PLATELET
Abs Immature Granulocytes: 0.35 10*3/uL — ABNORMAL HIGH (ref 0.00–0.07)
Basophils Absolute: 0 10*3/uL (ref 0.0–0.1)
Basophils Relative: 0 %
Eosinophils Absolute: 0 10*3/uL (ref 0.0–0.5)
Eosinophils Relative: 0 %
HCT: 32.8 % — ABNORMAL LOW (ref 39.0–52.0)
Hemoglobin: 10.6 g/dL — ABNORMAL LOW (ref 13.0–17.0)
Immature Granulocytes: 2 %
Lymphocytes Relative: 3 %
Lymphs Abs: 0.6 10*3/uL — ABNORMAL LOW (ref 0.7–4.0)
MCH: 31.1 pg (ref 26.0–34.0)
MCHC: 32.3 g/dL (ref 30.0–36.0)
MCV: 96.2 fL (ref 80.0–100.0)
Monocytes Absolute: 1.1 10*3/uL — ABNORMAL HIGH (ref 0.1–1.0)
Monocytes Relative: 6 %
Neutro Abs: 15.7 10*3/uL — ABNORMAL HIGH (ref 1.7–7.7)
Neutrophils Relative %: 89 %
Platelets: 88 10*3/uL — ABNORMAL LOW (ref 150–400)
RBC: 3.41 MIL/uL — ABNORMAL LOW (ref 4.22–5.81)
RDW: 21 % — ABNORMAL HIGH (ref 11.5–15.5)
WBC: 17.8 10*3/uL — ABNORMAL HIGH (ref 4.0–10.5)
nRBC: 0.2 % (ref 0.0–0.2)

## 2021-04-03 LAB — GLUCOSE, CAPILLARY
Glucose-Capillary: 74 mg/dL (ref 70–99)
Glucose-Capillary: 85 mg/dL (ref 70–99)
Glucose-Capillary: 113 mg/dL — ABNORMAL HIGH (ref 70–99)
Glucose-Capillary: 122 mg/dL — ABNORMAL HIGH (ref 70–99)
Glucose-Capillary: 117 mg/dL — ABNORMAL HIGH (ref 70–99)
Glucose-Capillary: 112 mg/dL — ABNORMAL HIGH (ref 70–99)

## 2021-04-03 LAB — GASTROINTESTINAL PANEL BY PCR, STOOL (REPLACES STOOL CULTURE)

## 2021-04-03 LAB — BASIC METABOLIC PANEL
Anion gap: 9 (ref 5–15)
BUN: 16 mg/dL (ref 8–23)
CO2: 21 mmol/L — ABNORMAL LOW (ref 22–32)
Calcium: 8.2 mg/dL — ABNORMAL LOW (ref 8.9–10.3)
Chloride: 106 mmol/L (ref 98–111)
Creatinine, Ser: 1.31 mg/dL — ABNORMAL HIGH (ref 0.61–1.24)
GFR, Estimated: 58 mL/min — ABNORMAL LOW (ref >60)
Glucose, Bld: 97 mg/dL (ref 70–99)
Potassium: 3.1 mmol/L — ABNORMAL LOW (ref 3.5–5.1)
Sodium: 136 mmol/L (ref 135–145)

## 2021-04-03 LAB — URINE CULTURE: Culture: NO GROWTH

## 2021-04-03 LAB — MAGNESIUM: Magnesium: 1.6 mg/dL — ABNORMAL LOW (ref 1.7–2.4)

## 2021-04-03 MED ORDER — PIPERACILLIN-TAZOBACTAM 3.375 G IVPB
3.3750 g | Freq: Three times a day (TID) | INTRAVENOUS | Status: DC
Start: 2021-04-03 — End: 2021-04-05
  Administered 2021-04-03 – 2021-04-05 (×6): 3.375 g via INTRAVENOUS
  Filled 2021-04-03 (×7): qty 50

## 2021-04-03 MED ORDER — POTASSIUM CHLORIDE 20 MEQ PO PACK
40.0000 meq | PACK | Freq: Once | ORAL | Status: AC
  Administered 2021-04-03: 40 meq via ORAL
  Filled 2021-04-03: qty 2

## 2021-04-03 MED ORDER — MAGNESIUM SULFATE 2 GM/50ML IV SOLN
2.0000 g | Freq: Once | INTRAVENOUS | Status: AC
  Administered 2021-04-03: 2 g via INTRAVENOUS
  Filled 2021-04-03: qty 50

## 2021-04-03 MED ORDER — MAGIC MOUTHWASH W/LIDOCAINE
5.0000 mL | Freq: Three times a day (TID) | ORAL | Status: DC
  Administered 2021-04-04 – 2021-04-06 (×7): 5 mL via ORAL
  Filled 2021-04-03 (×10): qty 5

## 2021-04-03 NOTE — Progress Notes (Signed)
Jon Williams   DOB:1951-02-03   HG#:[id number]   HDQ#:[id number]  Oncology follow up   Subjective: [name] is well-known to me, under my care for his gastric cancer.  He is on neoadjuvant chemotherapy, status post 6 cycles FLOT4, last cycle on March 22, 2021.  He developed a worsening neuropathy after last cycle of chemo, especially in his feet and legs.  He reports worsening diarrhea 2 days before he came in, and became very weak, with slow oral input at home.  Wife called 61, was brought in to ED, was found to be hypotensive.  He responded to IV fluids, overall feels better.  His vital signs stable, systolic blood pressure in 90's.  Wife at the bedside.  He is awake, alert, and answers questions appropriately.  He was able to walk in the hallway with physical therapist yesterday.  Diarrhea has much improved, he is on clear liquid now.   Objective:  Vitals:   04/03/21 0400 04/03/21 0700  BP: (!) 95/52   Pulse:    Resp: (!) 22 18  Temp: 97.9 F (36.6 C)   SpO2:      Body mass index is 30.46 kg/m.  Intake/Output Summary (Last 24 hours) at 04/03/2021 0754 Last data filed at 04/02/2021 2124 Gross per 24 hour  Intake 380 ml  Output 400 ml  Net -20 ml     Sclerae unicteric  Oropharynx clear  No peripheral adenopathy  Lungs clear -- no rales or rhonchi  Heart regular rate and rhythm  Abdomen soft, non-tender   MSK no focal spinal tenderness, no peripheral edema  Neuro nonfocal    CBG (last 3)  Recent Labs    04/02/21 2341 04/03/21 0357 04/03/21 0748  GLUCAP 110* 74 85     Labs:   Urine Studies No results for input(s): UHGB, CRYS in the last 72 hours.  Invalid input(s): UACOL, UAPR, USPG, UPH, UTP, UGL, UKET, UBIL, UNIT, UROB, ULEU, UEPI, UWBC, URBC, UBAC, CAST, [place], [place]  Basic Metabolic Panel: Recent Labs  Lab 04/01/21 1430 04/01/21 1918 04/02/21 0530 04/03/21 0501  NA 134*  --  136 136  K 3.6  --  3.1* 3.1*  CL 100  --  104 106  CO2 22  --  23 21*   GLUCOSE 133*  --  94 97  BUN 22  --  20 16  CREATININE 2.49*  --  1.69* 1.31*  CALCIUM 9.1  --  8.5* 8.2*  MG 1.8  --  1.7 1.6*  PHOS  --  4.0 4.4  --    GFR Estimated Creatinine Clearance: 52.3 mL/min (A) (by C-G formula based on SCr of 1.31 mg/dL (H)). Liver Function Tests: Recent Labs  Lab 04/01/21 1430 04/01/21 1918 04/02/21 0530  AST 46* 38 34  ALT 28 26 25   ALKPHOS 65 62 54  BILITOT 0.6 0.6 0.8  PROT 7.4 6.5 6.0*  ALBUMIN 4.2 3.8 3.4*   Recent Labs  Lab 04/01/21 1430  LIPASE 66*   No results for input(s): AMMONIA in the last 168 hours. Coagulation profile No results for input(s): INR, PROTIME in the last 168 hours.  CBC: Recent Labs  Lab 04/01/21 1430 04/01/21 2330 04/02/21 0530 04/03/21 0501  WBC 24.0* 19.3* 15.7* 17.8*  NEUTROABS 20.6*  --  13.5* 15.7*  HGB 12.5* 11.4* 11.3* 10.6*  HCT 38.5* 35.8* 34.8* 32.8*  MCV 95.1 96.5 95.9 96.2  PLT 114* 103* 97* 88*   Cardiac Enzymes: Recent Labs  Lab 04/01/21  1918  CKTOTAL 163   BNP: Invalid input(s): POCBNP CBG: Recent Labs  Lab 04/02/21 1539 04/02/21 1928 04/02/21 2341 04/03/21 0357 04/03/21 0748  GLUCAP 102* 99 110* 74 85   D-Dimer No results for input(s): DDIMER in the last 72 hours. Hgb A1c Recent Labs    04/01/21 2055  HGBA1C 6.3*   Lipid Profile No results for input(s): CHOL, HDL, LDLCALC, TRIG, CHOLHDL, LDLDIRECT in the last 72 hours. Thyroid function studies Recent Labs    04/01/21 1920  TSH 0.473   Anemia work up No results for input(s): VITAMINB12, FOLATE, FERRITIN, TIBC, IRON, RETICCTPCT in the last 72 hours. Microbiology Recent Results (from the past 240 hour(s))  Culture, blood (routine x 2)     Status: None (Preliminary result)   Collection Time: 04/01/21  2:30 PM   Specimen: BLOOD  Result Value Ref Range Status   Specimen Description   Final    BLOOD PORTA CATH Performed at [facility] [address], [place], [place] [address]    Special  Requests   Final    BOTTLES DRAWN AEROBIC AND ANAEROBIC Blood Culture adequate volume Performed at [facility] [address], [place], [place] [address]    Culture   Final    NO GROWTH < 12 HOURS Performed at [facility] [address] [place], [place] [address]    Report Status PENDING  Incomplete  Culture, blood (routine x 2)     Status: None (Preliminary result)   Collection Time: 04/01/21  3:17 PM   Specimen: BLOOD  Result Value Ref Range Status   Specimen Description   Final    BLOOD PORTA CATH Performed at [facility] [address] [place], [place] [address]    Special Requests   Final    BOTTLES DRAWN AEROBIC AND ANAEROBIC Blood Culture adequate volume Performed at [facility] [address], [place], [place] [address]    Culture   Final    NO GROWTH < 12 HOURS Performed at [facility] [address] [place], [place] [address]    Report Status PENDING  Incomplete  C Difficile Quick Screen w PCR reflex     Status: None   Collection Time: 04/01/21  4:12 PM   Specimen: STOOL  Result Value Ref Range Status   C Diff antigen NEGATIVE NEGATIVE Final   C Diff toxin NEGATIVE NEGATIVE Final   C Diff interpretation No C. difficile detected.  Final    Comment: Performed at [facility], [place] [address] [place], [place] [address]  MRSA Next Gen by PCR, Nasal     Status: None   Collection Time: 04/01/21  9:50 PM   Specimen: Nasal Mucosa; Nasal Swab  Result Value Ref Range Status   MRSA by PCR Next Gen NOT DETECTED NOT DETECTED Final    Comment: (NOTE) The GeneXpert MRSA Assay (FDA approved for NASAL specimens only), is one component of a comprehensive MRSA colonization surveillance program. It is not intended to diagnose MRSA infection nor to guide or monitor treatment for MRSA infections. Test performance is not FDA approved in patients less than 5 years old. Performed at  [facility], [place] [address], [place],  [address]   Resp Panel by RT-PCR (Flu A&B, Covid) Nasopharyngeal Swab     Status: None   Collection Time: 04/01/21 11:57 PM   Specimen: Nasopharyngeal Swab; Nasopharyngeal(NP) swabs in vial transport medium  Result  Value Ref Range Status   SARS Coronavirus 2 by RT PCR NEGATIVE NEGATIVE Final    Comment: (NOTE) SARS-CoV-2 target nucleic acids are NOT DETECTED.  The SARS-CoV-2 RNA is generally detectable in upper respiratory specimens during the acute phase of infection. The lowest concentration of SARS-CoV-2 viral copies this assay can detect is 138 copies/mL. A negative result does not preclude SARS-Cov-2 infection and should not be used as the sole basis for treatment or other patient management decisions. A negative result may occur with  improper specimen collection/handling, submission of specimen other than nasopharyngeal swab, presence of viral mutation(s) within the areas targeted by this assay, and inadequate number of viral copies(<138 copies/mL). A negative result must be combined with clinical observations, patient history, and epidemiological information. The expected result is Negative.  Fact Sheet for Patients:  EntrepreneurPulse.com.au  Fact Sheet for Healthcare Providers:  IncredibleEmployment.be  This test is no t yet approved or cleared by the [place] FDA and  has been authorized for detection and/or diagnosis of SARS-CoV-2 by FDA under an Emergency Use Authorization (EUA). This EUA will remain  in effect (meaning this test can be used) for the duration of the COVID-19 declaration under Section 564(b)(1) of the Act, 21 U.S.C.section 360bbb-3(b)(1), unless the authorization is terminated  or revoked sooner.       Influenza A by PCR NEGATIVE NEGATIVE Final   Influenza B by PCR NEGATIVE NEGATIVE Final    Comment: (NOTE) The Xpert Xpress  SARS-CoV-2/FLU/RSV plus assay is intended as an aid in the diagnosis of influenza from Nasopharyngeal swab specimens and should not be used as a sole basis for treatment. Nasal washings and aspirates are unacceptable for Xpert Xpress SARS-CoV-2/FLU/RSV testing.  Fact Sheet for Patients: EntrepreneurPulse.com.au  Fact Sheet for Healthcare Providers: IncredibleEmployment.be  This test is not yet approved or cleared by the [place] FDA and has been authorized for detection and/or diagnosis of SARS-CoV-2 by FDA under an Emergency Use Authorization (EUA). This EUA will remain in effect (meaning this test can be used) for the duration of the COVID-19 declaration under Section 564(b)(1) of the Act, 21 U.S.C. section 360bbb-3(b)(1), unless the authorization is terminated or revoked.  Performed at [facility], [place] [address], [place], [place] [address]       Studies:  CT ABDOMEN PELVIS WO CONTRAST  Result Date: 04/01/2021 CLINICAL DATA:  Gastric cancer. Abdominal pain and cramping. Nausea vomiting and diarrhea. EXAM: CT ABDOMEN AND PELVIS WITHOUT CONTRAST TECHNIQUE: Multidetector CT imaging of the abdomen and pelvis was performed following the standard protocol without IV contrast. RADIATION DOSE REDUCTION: This exam was performed according to the departmental dose-optimization program which includes automated exposure control, adjustment of the mA and/or kV according to patient size and/or use of iterative reconstruction technique. COMPARISON:  12/01/2020 FINDINGS: Lower chest: Unremarkable. Hepatobiliary: No suspicious focal abnormality in the liver on this study without intravenous contrast. There is no evidence for gallstones, gallbladder wall thickening, or pericholecystic fluid. No intrahepatic or extrahepatic biliary dilation. Pancreas: No focal mass lesion. No dilatation of the main duct. No intraparenchymal cyst. No  peripancreatic edema. Spleen: No splenomegaly. No focal mass lesion. Adrenals/Urinary Tract: Nodular thickening of the right adrenal gland is stable. 16 mm left adrenal nodule on 20/2 is low density compatible with adenoma, unchanged in the interval. Kidneys unremarkable. No evidence for hydroureter. The urinary bladder appears normal for the degree of distention. Stomach/Bowel: Stomach is unremarkable. No gastric wall thickening. No evidence of outlet obstruction. Duodenum is  normally positioned as is the ligament of Treitz. No small bowel wall thickening. No small bowel dilatation. The terminal ileum is normal. The appendix is normal. No gross colonic mass. No colonic wall thickening. A few scattered left colonic diverticuli noted without diverticulitis. Vascular/Lymphatic: There is mild atherosclerotic calcification of the abdominal aorta without aneurysm. There is no gastrohepatic or hepatoduodenal ligament lymphadenopathy. No retroperitoneal or mesenteric lymphadenopathy. No pelvic sidewall lymphadenopathy. Reproductive: Fiducial markers noted in the prostate gland. Other: No intraperitoneal free fluid. Musculoskeletal: Right groin hernia contains only fat. Small fat containing umbilical hernia evident. No worrisome lytic or sclerotic osseous abnormality. Bilateral pars interarticularis defects noted at L4. 10 mm anterolisthesis of L4 on 5 is stable. IMPRESSION: 1. No acute findings in the abdomen or pelvis. Specifically, no evidence for metastatic disease in the abdomen or pelvis. 2. Stable 16 mm left adrenal adenoma. 3. Small fat containing hernias in the right groin and umbilical region. 4. Bilateral pars interarticularis defects at L4 with 10 mm anterolisthesis of L4 on 5, stable. 5. Aortic Atherosclerosis (ICD10-I70.0). Electronically Signed   By: Misty Stanley M.D.   On: 04/01/2021 17:35   DG Chest Port 1 View  Result Date: 04/01/2021 CLINICAL DATA:  Weakness, shortness of breath. History of cancer  with last chemotherapy on 03/22/2021. EXAM: PORTABLE CHEST 1 VIEW COMPARISON:  Chest radiograph 12/23/2020. FINDINGS: Right power injectable port central venous catheter tip projects at the level of the mid SVC. Left chest wall pacemaker with leads overlying the right atrium and right ventricle. Lungs are clear. No pleural effusion or pneumothorax. Cardiomegaly, mildly decreased compared to prior exam on 12/23/2020. IMPRESSION: No acute cardiopulmonary abnormality. Cardiomegaly again demonstrated, though less severe compared to prior radiograph 12/23/2020. Electronically Signed   By: Ileana Roup M.D.   On: 04/01/2021 15:51   ECHOCARDIOGRAM COMPLETE  Result Date: 04/02/2021    ECHOCARDIOGRAM REPORT   Patient Name:   JAQWON MANFRED Date of Exam: 04/02/2021 Medical Rec #:  [id number]        Height:       66.0 in Accession #:    [id number]       Weight:       188.7 lb Date of Birth:  04-21-1950         BSA:          1.951 m Patient Age:    71 years         BP:           83/49 mmHg Patient Gender: M                HR:           83 bpm. Exam Location:  Inpatient Procedure: 2D Echo, Cardiac Doppler and Color Doppler Indications:    I42.1 HYPERTHROPHIC CARDIOMYOPATHY  History:        Patient has prior history of Echocardiogram examinations, most                 recent 12/07/2020. Cardiomyopathy, Cardiomegaly and CHF,                 Arrythmias:LBBB and Atrial Fibrillation; Risk Factors:Diabetes.                 PULM. HTN / NICM.  Sonographer:    Beryle Beams Referring Phys: [place]  1. Left ventricular ejection fraction, by estimation, is 20 to 25%. The left ventricle has severely decreased function. The left ventricle demonstrates global hypokinesis. Left  ventricular diastolic parameters are indeterminate.  2. RV not well visualized, grossly appears normal in size and function. . Right ventricular systolic function was not well visualized. The right ventricular size is not well visualized.  Tricuspid regurgitation signal is inadequate for assessing PA pressure.  3. Left atrial size was mildly dilated.  4. The mitral valve is abnormal. Mild mitral valve regurgitation. No evidence of mitral stenosis.  5. The aortic valve was not well visualized. Aortic valve regurgitation is not visualized. No aortic stenosis is present.  6. The inferior vena cava is normal in size with greater than 50% respiratory variability, suggesting right atrial pressure of 3 mmHg. FINDINGS  Left Ventricle: Left ventricular ejection fraction, by estimation, is 20 to 25%. The left ventricle has severely decreased function. The left ventricle demonstrates global hypokinesis. The left ventricular internal cavity size was normal in size. There is no left ventricular hypertrophy. Left ventricular diastolic parameters are indeterminate. Right Ventricle: RV not well visualized, grossly appears normal in size and function. The right ventricular size is not well visualized. Right vetricular wall thickness was not well visualized. Right ventricular systolic function was not well visualized.  Tricuspid regurgitation signal is inadequate for assessing PA pressure. Left Atrium: Left atrial size was mildly dilated. Right Atrium: Right atrial size was normal in size. Pericardium: There is no evidence of pericardial effusion. Mitral Valve: The mitral valve is abnormal. Mild mitral valve regurgitation. No evidence of mitral valve stenosis. MV peak gradient, 38.7 mmHg. The mean mitral valve gradient is 28.0 mmHg. Tricuspid Valve: The tricuspid valve is not well visualized. Tricuspid valve regurgitation is not demonstrated. No evidence of tricuspid stenosis. Aortic Valve: The aortic valve was not well visualized. Aortic valve regurgitation is not visualized. No aortic stenosis is present. Aortic valve mean gradient measures 2.0 mmHg. Aortic valve peak gradient measures 4.5 mmHg. Aortic valve area, by VTI measures 1.18 cm. Pulmonic Valve: The  pulmonic valve was not well visualized. Pulmonic valve regurgitation is not visualized. No evidence of pulmonic stenosis. Aorta: The aortic root is normal in size and structure. Venous: The inferior vena cava is normal in size with greater than 50% respiratory variability, suggesting right atrial pressure of 3 mmHg. IAS/Shunts: No atrial level shunt detected by color flow Doppler.  LEFT VENTRICLE PLAX 2D LVIDd:         4.70 cm LVIDs:         5.00 cm LV PW:         1.00 cm LV IVS:        0.90 cm LVOT diam:     1.80 cm LV SV:         20 LV SV Index:   10 LVOT Area:     2.54 cm  LV Volumes (MOD) LV vol d, MOD A2C: 183.0 ml LV vol d, MOD A4C: 183.0 ml LV vol s, MOD A2C: 117.0 ml LV vol s, MOD A4C: 157.0 ml LV SV MOD A2C:     66.0 ml LV SV MOD A4C:     183.0 ml LV SV MOD BP:      47.0 ml RIGHT VENTRICLE            IVC RV S prime:     8.49 cm/s  IVC diam: 1.60 cm TAPSE (M-mode): 1.8 cm LEFT ATRIUM             Index        RIGHT ATRIUM  Index LA diam:        4.10 cm 2.10 cm/m   RA Area:     19.00 cm LA Vol (A2C):   83.5 ml 42.80 ml/m  RA Volume:   56.00 ml  28.70 ml/m LA Vol (A4C):   63.6 ml 32.60 ml/m LA Biplane Vol: 77.1 ml 39.52 ml/m  AORTIC VALVE                    PULMONIC VALVE AV Area (Vmax):    1.40 cm     PV Vmax:       0.51 m/s AV Area (Vmean):   1.42 cm     PV Vmean:      29.700 cm/s AV Area (VTI):     1.18 cm     PV VTI:        0.091 m AV Vmax:           106.00 cm/s  PV Peak grad:  1.0 mmHg AV Vmean:          73.100 cm/s  PV Mean grad:  0.0 mmHg AV VTI:            0.166 m AV Peak Grad:      4.5 mmHg AV Mean Grad:      2.0 mmHg LVOT Vmax:         58.40 cm/s LVOT Vmean:        40.700 cm/s LVOT VTI:          0.077 m LVOT/AV VTI ratio: 0.46  AORTA Ao Root diam: 3.10 cm Ao Asc diam:  2.70 cm MITRAL VALVE MV Area (PHT): 5.54 cm    SHUNTS MV Area VTI:   0.15 cm    Systemic VTI:  0.08 m MV Peak grad:  38.7 mmHg   Systemic Diam: 1.80 cm MV Mean grad:  28.0 mmHg MV Vmax:       3.11 m/s MV Vmean:       253.0 cm/s MV Decel Time: 137 msec MV E velocity: 49.70 cm/s MV A velocity: 19.70 cm/s MV E/A ratio:  2.52 Carlyle Dolly MD Electronically signed by Carlyle Dolly MD Signature Date/Time: 04/02/2021/11:41:53 AM    Final     Assessment: 71 y.o. male  Hypotension secondary to dehydration from diarrhea, and poor oral intake.  Resolved Leukocytosis and lactic acidosis, likely secondary to dehydration and G-CSF, no clear source of infection. Nausea, diarrhea, likely secondary to chemotherapy, improved AKI, secondary to dehydration and hypotension, improved with IV Chronic combined congestive heart failure with ICD, AF  Gastric cancer on neoadjuvant chemotherapy ulcer in the right foot DM  Deconditioning secondary to chemo     Plan:  -He is clinically improving with IV fluids and supportive care -his GI symptoms and dehydration are likely related to his chemo, no source of infection found so far  -CT scan from April 01, 2021 reviewed, previously noticed focal wall thickening along the posterior aspect of gastric antrum has resolved, no evidence of metastatic disease, likely indicating good response to neoadjuvant chemotherapy. -He has 2 more scheduled chemotherapy, due to his worsening neuropathy and the severe complications from last cycle chemotherapy, I will stop neoadjuvant chemo, and give him time to recover, and proceed with gastric surgery with Dr. Zenia Resides.  I may consider additional adjuvant chemotherapy depending on his surgical pathology findings. -I will f/u in hospital and after discharge    Truitt Merle, MD 04/03/2021  7:54 AM

## 2021-04-03 NOTE — Plan of Care (Signed)

## 2021-04-03 NOTE — Progress Notes (Signed)
Pharmacy Antibiotic Note  Jon Williams is a 71 y.o. male with a h/o gastric cancer on chemo admitted on 04/01/2021 with hypotension and leukocytosis. Patient is currently on empiric cefepime and Flagyl. Pharmacy is consulted for transitioning to Zosyn.   Plan: Zosyn 3.375g IV q8h (4 hour infusion).  Will sign off and follow remotely.   Height: 5\' 6"  (167.6 cm) Weight: 85.6 kg (188 lb 11.4 oz) IBW/kg (Calculated) : 63.8  Temp (24hrs), Avg:97.8 F (36.6 C), Min:97.3 F (36.3 C), Max:98.5 F (36.9 C)  Recent Labs  Lab 04/01/21 1430 04/01/21 1746 04/01/21 2330 04/01/21 2336 04/02/21 0031 04/02/21 0530 04/03/21 0501  WBC 24.0*  --  19.3*  --   --  15.7* 17.8*  CREATININE 2.49*  --   --   --   --  1.69* 1.31*  LATICACIDVEN 3.7* 7.6*  --  1.6 1.3 1.1  --     Estimated Creatinine Clearance: 52.3 mL/min (A) (by C-G formula based on SCr of 1.31 mg/dL (H)).    Allergies  Allergen Reactions   Aspirin Anaphylaxis and Hives   Sulfa Antibiotics Anaphylaxis and Hives   Other Other (See Comments)    Thank you for allowing pharmacy to be a part of this patients care.  Napoleon Form 04/03/2021 8:34 AM

## 2021-04-03 NOTE — Progress Notes (Addendum)
PROGRESS NOTE    Jon Williams  DUK:[id number] DOB: 03/04/1950 DOA: 04/01/2021 PCP: [name], MD     Brief Narrative:  H/o CHF, gastric cancer, last chemo 03/22/21 c/o abd pain/cramping,  vomiting and diarrhea x2 days.  Decreased oral intake, no fever, denies sick contacts.   Thought to be due to chemo related complications as all infectious work-up which include urine cultures, blood cultures and GI pathogen panel was negative. Patient improving clinically, nausea, vomiting and diarrhea improved.  Started tolerating diet. Oncology decided to hold on chemotherapy and most likely he will proceed with his gastric surgery.  They will follow-up as an outpatient for further recommendations.  Subjective: Patient was feeling improving when seen today.  Wife at bedside.  Denies any more nausea, vomiting or diarrhea.  Tolerating full liquid diet, still feeling little weak but good spirits.  Assessment & Plan:  Principal Problem:   SIRS (systemic inflammatory response syndrome) (HCC) Active Problems:   Chronic combined systolic and diastolic CHF (congestive heart failure) (HCC)   LBBB (left bundle branch block)   Coronary artery disease involving native coronary artery without angina pectoris   Persistent atrial fibrillation (HCC)   Gastric cancer (HCC)   Iron deficiency anemia due to chronic blood loss   Diarrhea   Occult blood in stools   Foot ulcer, right (HCC)   Hypotension   Dehydration   Assessment and Plan:  Severe hypotension.  Resolved. -No apparent source of infection, currently on empiric antibiotics to cover GI source -Seen by critical care, Point-of-care ultrasound on IVC  shows nearly 100% collapsible,  BNP is lower than previously noted.  No peripheral edema or pulmonary edema, Venous sat off of the patient's port (which would be central) is 58.1% which is not entirely consistent with cardiogenic shock -All home blood pressure medication held, appear responded by  fluids resuscitation, most likely hypovolemic hypotension  Lactic acidosis.  Resolved Possibly from tissue-poor perfusion from severe hypotension No overt source of infection  Significant leukocytosis Likely combination of dehydration and possible effect from G-CSF ( received on 2/9) Mild worsening of leukocytosis, remained afebrile.  Procalcitonin at 0.63 -Switch cefepime with Zosyn -Continue to monitor  Thrombocytopenia Form viral infection versus from chemo, no obvious bleeding.  Platelets at 88 today. Monitor  Iron deficiency anemia due to chronic blood loss  Chronic currently appears to be stable continue to monitor   Nausea /vomiting /diarrhea/heme positive stool C. difficile negative GI PCR panel negative. Seen by GI, no intervention planned Appear clinically improving with some worsening of leukocytosis. -Switch cefepime with Zosyn.  AKI.  Continue to improve, creatinine at 1.31 today Likely from hypotension and dehydration UA unremarkable No hydronephrosis on CT scan Repeat BMP in the morning Renal dosing meds  Hypokalemia, magnesium at 1.6 Replace K and magnesium -Continue to monitor going  Chronic combined systolic and diastolic CHF (congestive heart failure) (HCC) /CAD/ischemic cardiomyopathy s/p Biv ICD in 2018/ Persistent atrial fibrillation (HCC)  Denies chest pain , present with dehydration /severe hypotension, chest x-ray clear , no edema on exam  Hold Coreg, sotalol ,Lasix, Entresto, resume when BP and renal function improves Not on anticoagulation due to GI cancer Continue statin  Noninsulin-dependent type 2 diabetes Home metformin held On SSI   Foot ulcer, right ([place])  Followed by podiatry appears to be noninfected at this time   wound care consult  Gastric cancer [facility])  Last chemo on 2/8  CT on admission did not show any metastatic disease  -Per  oncology note further chemo is being held due to worsening of chemo related neuropathy and  gastritis.  They are recommending surgery and will follow-up as an outpatient for further recommendations.   I have Reviewed nursing notes, Vitals, pain scores, I/o's, Lab results and  imaging results since pt's last encounter, details please see discussion above  I ordered the following labs:   Unresulted Labs (From admission, onward)     Start     Ordered   04/04/21 0500  CBC  Tomorrow morning,   R       Question:  Specimen collection method  Answer:  Unit=Unit collect   04/03/21 1146   04/04/21 0539  Basic metabolic panel  Tomorrow morning,   R       Question:  Specimen collection method  Answer:  Unit=Unit collect   04/03/21 1146             DVT prophylaxis: SCDs Start: 04/01/21 2154   Code Status:   Code Status: DNR  Family Communication: Discussed with wife at bedside. Disposition:   Status is: Inpatient  Dispo: The patient is from: Home              Anticipated d/c is to: Home, may need home health              Anticipated d/c date is: Greater than 24 hours  Antimicrobials:   Anti-infectives (From admission, onward)    Start     Dose/Rate Route Frequency Ordered Stop   04/03/21 0900  piperacillin-tazobactam (ZOSYN) IVPB 3.375 g        3.375 g 12.5 mL/hr over 240 Minutes Intravenous Every 8 hours 04/03/21 0811     04/02/21 1600  ceFEPIme (MAXIPIME) 2 g in sodium chloride 0.9 % 100 mL IVPB  Status:  Discontinued        2 g 200 mL/hr over 30 Minutes Intravenous Every 24 hours 04/01/21 1940 04/02/21 1332   04/02/21 1430  ceFEPIme (MAXIPIME) 2 g in sodium chloride 0.9 % 100 mL IVPB  Status:  Discontinued        2 g 200 mL/hr over 30 Minutes Intravenous Every 12 hours 04/02/21 1332 04/03/21 0745   04/02/21 0600  metroNIDAZOLE (FLAGYL) IVPB 500 mg  Status:  Discontinued        500 mg 100 mL/hr over 60 Minutes Intravenous Every 12 hours 04/01/21 1918 04/03/21 0811   04/01/21 1615  ceFEPIme (MAXIPIME) 2 g in sodium chloride 0.9 % 100 mL IVPB        2 g 200 mL/hr  over 30 Minutes Intravenous  Once 04/01/21 1611 04/01/21 1722   04/01/21 1615  metroNIDAZOLE (FLAGYL) IVPB 500 mg        500 mg 100 mL/hr over 60 Minutes Intravenous  Once 04/01/21 1611 04/01/21 1732   04/01/21 1530  ceFEPIme (MAXIPIME) 2 g in sodium chloride 0.9 % 100 mL IVPB  Status:  Discontinued        2 g 200 mL/hr over 30 Minutes Intravenous  Once 04/01/21 1528 04/01/21 1534   04/01/21 1530  metroNIDAZOLE (FLAGYL) IVPB 500 mg  Status:  Discontinued        500 mg 100 mL/hr over 60 Minutes Intravenous  Once 04/01/21 1528 04/01/21 1534           Objective: Vitals:   04/03/21 0800 04/03/21 0802 04/03/21 1000 04/03/21 1217  BP: (!) 79/61  93/66   Pulse:      Resp: 13  20  Temp:  98.5 F (36.9 C)  (!) 97.4 F (36.3 C)  TempSrc:  Oral  Oral  SpO2: 100%     Weight:      Height:        Intake/Output Summary (Last 24 hours) at 04/03/2021 1240 Last data filed at 04/03/2021 3734 Gross per 24 hour  Intake 368.34 ml  Output --  Net 368.34 ml    Filed Weights   04/01/21 1434 04/01/21 2130  Weight: 88.9 kg 85.6 kg    Examination:  General.  Frail gentleman, in no acute distress. Pulmonary.  Lungs clear bilaterally, normal respiratory effort. CV.  Irregularly irregular Abdomen.  Soft, nontender, nondistended, BS positive. CNS.  Alert and oriented .  No focal neurologic deficit. Extremities.  Multiple bandages on both lower extremities, no cyanosis, pulses intact and symmetrical. Psychiatry.  Judgment and insight appears normal.    Data Reviewed: I have personally reviewed  labs and visualized  imaging studies since the last encounter and formulate the plan   Scheduled Meds:  atorvastatin  40 mg Oral Daily   Chlorhexidine Gluconate Cloth  6 each Topical QHS   insulin aspart  0-9 Units Subcutaneous Q4H   mouth rinse  15 mL Mouth Rinse BID   pantoprazole (PROTONIX) IV  40 mg Intravenous Q12H   Continuous Infusions:  sodium chloride 10 mL/hr at 04/02/21 1100    piperacillin-tazobactam (ZOSYN)  IV Stopped (04/03/21 1325)     LOS: 2 days   This record has been created using Systems analyst. Errors have been sought and corrected,but may not always be located. Such creation errors do not reflect on the standard of care.   Lorella Nimrod, MD  [facility]  Available via Epic secure chat 7am-7pm for nonurgent issues Please page for urgent issues To page the attending provider between 7A-7P or the covering provider during after hours 7P-7A, please log into the web site [email or web address] and access using universal [place] password for that web site. If you do not have the password, please call the hospital operator.    04/03/2021, 12:40 PM

## 2021-04-03 NOTE — Progress Notes (Signed)
Chaplain engaged in an initial visit with Jon Williams and his wife.  They have been married 63 years and met while working and going to college.  Jon Williams expressed his gratefulness for his wife being by his side as he has undergone so many changes and has needed a lot of help.    Jon Williams and Jon Williams talked about the divine way Jon Williams was able to get the hospital and be seen quickly. He knew that he couldn't continue to throw up profusely like he was at home and that he needed help.  Jon Williams voiced that he had to be obedient to God.  Jon Williams has been dependent on his wife and his faith to get him through.  He kept reciting the scripture, "I will lift up mine eyes unto the hills, from whence cometh my help" ([place] 121:1).  He has been fighting in more ways than one to keep going.  He declared that he is grateful that he could do PT yesterday and get up and walk.  He is pushing himself as much as he can.   Chaplain was able to have a conversation with Jon Williams and Jon Williams around church, faith and the world.  Jon Williams was grateful for the encouragement he received today. Chaplain offered listening, presence, prayer and support.     04/03/21 1100  Clinical Encounter Type  Visited With Patient and family together  Visit Type Initial;Spiritual support  Spiritual Encounters  Spiritual Needs Prayer  Stress Factors  Patient Stress Factors Health changes;Major life changes

## 2021-04-03 NOTE — Progress Notes (Signed)
Physical Therapy Treatment Patient Details Name: Jon Williams MRN: [id number] DOB: [birth year] Today's Date: 04/03/2021   History of Present Illness Pt admitted from home with weakness, dehydration, diarrhea, sepsis, SIRS adn with hx of CHF, DM, DJD, afib, cardiomegly, nonischemic cardiomyopathy, AICD, R TKR, LBBB, prostate CE and gastric CA with last chemo 03/22/21    PT Comments    Pt ambulated 35' with RW, distance limited by therapist 2* HR elevated to 141. Instructed pt in seated BUE/LE exercises and encouraged him to perform these independently to minimize deconditioning during hospitalization.     Recommendations for follow up therapy are one component of a multi-disciplinary discharge planning process, led by the attending physician.  Recommendations may be updated based on patient status, additional functional criteria and insurance authorization.  Follow Up Recommendations  Home health PT     Assistance Recommended at Discharge Frequent or constant Supervision/Assistance  Patient can return home with the following A little help with walking and/or transfers;A little help with bathing/dressing/bathroom;Assistance with cooking/housework;Assist for transportation;Help with stairs or ramp for entrance   Equipment Recommendations  Rolling walker (2 wheels)    Recommendations for Other Services       Precautions / Restrictions Precautions Precautions: Fall Restrictions Weight Bearing Restrictions: No     Mobility  Bed Mobility Overal bed mobility: Needs Assistance Bed Mobility: Supine to Sit     Supine to sit: Min assist     General bed mobility comments: min A to raise trunk    Transfers Overall transfer level: Needs assistance Equipment used: Rolling walker (2 wheels) Transfers: Sit to/from Stand Sit to Stand: Min assist           General transfer comment: assist to bring wt up and fwd and to balance in initial standing     Ambulation/Gait Ambulation/Gait assistance: Min guard Gait Distance (Feet): 35 Feet Assistive device: Rolling walker (2 wheels) Gait Pattern/deviations: Step-to pattern, Shuffle, Trunk flexed, Wide base of support Gait velocity: decr     General Gait Details: cues for posture and position from RW;  HR 141 max,  distance ltd by therapist 2* elevated HR   Stairs             Wheelchair Mobility    Modified Rankin (Stroke Patients Only)       Balance Overall balance assessment: Needs assistance Sitting-balance support: Feet supported, No upper extremity supported Sitting balance-Leahy Scale: Good     Standing balance support: Bilateral upper extremity supported, Reliant on assistive device for balance Standing balance-Leahy Scale: Poor                              Cognition Arousal/Alertness: Awake/alert Behavior During Therapy: WFL for tasks assessed/performed Overall Cognitive Status: Within Functional Limits for tasks assessed                                 General Comments: patient is oriented to date, self, and hospital.        Exercises General Exercises - Upper Extremity Shoulder Flexion: AROM, Both, 10 reps, Seated Shoulder Horizontal ABduction: AROM, Both, 10 reps, Seated General Exercises - Lower Extremity Ankle Circles/Pumps: AROM, Both, 10 reps, Seated Long Arc Quad: AROM, Both, 10 reps, Seated Hip Flexion/Marching: AROM, Both, 10 reps, Seated    General Comments        Pertinent Vitals/Pain Pain Assessment Pain Score: 6  Pain Location: BLEs Pain Descriptors / Indicators: Heaviness Pain Intervention(s): Limited activity within patient's tolerance, Monitored during session, Repositioned    Home Living                          Prior Function            PT Goals (current goals can now be found in the care plan section) Acute Rehab PT Goals Patient Stated Goal: Regain IND PT Goal Formulation: With  patient/family Time For Goal Achievement: 04/16/21 Potential to Achieve Goals: Good Progress towards PT goals: Progressing toward goals    Frequency    Min 3X/week      PT Plan Current plan remains appropriate    Co-evaluation              AM-PAC PT "6 Clicks" Mobility   Outcome Measure  Help needed turning from your back to your side while in a flat bed without using bedrails?: None Help needed moving from lying on your back to sitting on the side of a flat bed without using bedrails?: A Little Help needed moving to and from a bed to a chair (including a wheelchair)?: A Little Help needed standing up from a chair using your arms (e.g., wheelchair or bedside chair)?: A Little Help needed to walk in hospital room?: A Little Help needed climbing 3-5 steps with a railing? : A Lot 6 Click Score: 18    End of Session Equipment Utilized During Treatment: Gait belt Activity Tolerance: Patient limited by fatigue Patient left: in chair;with call bell/phone within reach;with chair alarm set;with family/visitor present Nurse Communication: Mobility status PT Visit Diagnosis: Unsteadiness on feet (R26.81);Muscle weakness (generalized) (M62.81);Difficulty in walking, not elsewhere classified (R26.2);Pain Pain - part of body: Ankle and joints of foot     Time: 1430-1450 PT Time Calculation (min) (ACUTE ONLY): 20 min  Charges:  $Gait Training: 8-22 mins                     Blondell Reveal Kistler PT 04/03/2021  Acute Rehabilitation Services Pager [phone] Office [phone]

## 2021-04-04 ENCOUNTER — Telehealth: Payer: Self-pay | Admitting: Hematology

## 2021-04-04 DIAGNOSIS — I9589 Other hypotension: Secondary | ICD-10-CM

## 2021-04-04 DIAGNOSIS — E861 Hypovolemia: Secondary | ICD-10-CM | POA: Diagnosis not present

## 2021-04-04 DIAGNOSIS — D696 Thrombocytopenia, unspecified: Secondary | ICD-10-CM

## 2021-04-04 DIAGNOSIS — R112 Nausea with vomiting, unspecified: Secondary | ICD-10-CM

## 2021-04-04 DIAGNOSIS — D72829 Elevated white blood cell count, unspecified: Secondary | ICD-10-CM

## 2021-04-04 DIAGNOSIS — N179 Acute kidney failure, unspecified: Secondary | ICD-10-CM

## 2021-04-04 HISTORY — DX: Nausea with vomiting, unspecified: R11.2

## 2021-04-04 LAB — CBC
HCT: 34.3 % — ABNORMAL LOW (ref 39.0–52.0)
Hemoglobin: 11 g/dL — ABNORMAL LOW (ref 13.0–17.0)
MCH: 31.2 pg (ref 26.0–34.0)
MCHC: 32.1 g/dL (ref 30.0–36.0)
MCV: 97.2 fL (ref 80.0–100.0)
Platelets: 85 10*3/uL — ABNORMAL LOW (ref 150–400)
RBC: 3.53 MIL/uL — ABNORMAL LOW (ref 4.22–5.81)
RDW: 21 % — ABNORMAL HIGH (ref 11.5–15.5)
WBC: 16 10*3/uL — ABNORMAL HIGH (ref 4.0–10.5)
nRBC: 0.3 % — ABNORMAL HIGH (ref 0.0–0.2)

## 2021-04-04 LAB — GLUCOSE, CAPILLARY
Glucose-Capillary: 106 mg/dL — ABNORMAL HIGH (ref 70–99)
Glucose-Capillary: 111 mg/dL — ABNORMAL HIGH (ref 70–99)
Glucose-Capillary: 166 mg/dL — ABNORMAL HIGH (ref 70–99)
Glucose-Capillary: 139 mg/dL — ABNORMAL HIGH (ref 70–99)
Glucose-Capillary: 105 mg/dL — ABNORMAL HIGH (ref 70–99)

## 2021-04-04 LAB — BASIC METABOLIC PANEL
Anion gap: 9 (ref 5–15)
BUN: 14 mg/dL (ref 8–23)
CO2: 20 mmol/L — ABNORMAL LOW (ref 22–32)
Calcium: 8.4 mg/dL — ABNORMAL LOW (ref 8.9–10.3)
Chloride: 109 mmol/L (ref 98–111)
Creatinine, Ser: 1.44 mg/dL — ABNORMAL HIGH (ref 0.61–1.24)
GFR, Estimated: 52 mL/min — ABNORMAL LOW (ref >60)
Glucose, Bld: 107 mg/dL — ABNORMAL HIGH (ref 70–99)
Potassium: 3.5 mmol/L (ref 3.5–5.1)
Sodium: 138 mmol/L (ref 135–145)

## 2021-04-04 MED ORDER — SODIUM CHLORIDE 0.9% FLUSH
10.0000 mL | INTRAVENOUS | Status: DC | PRN
  Administered 2021-04-06: 10 mL

## 2021-04-04 MED ORDER — SODIUM CHLORIDE 0.9% FLUSH
10.0000 mL | Freq: Two times a day (BID) | INTRAVENOUS | Status: DC
  Administered 2021-04-05 – 2021-04-06 (×3): 10 mL

## 2021-04-04 NOTE — Telephone Encounter (Signed)
Jon Williams called to follow up on voice mail received. She was wondering if she could be called . Jon Williams says that when they came in before, she felt as if it was a visit that was unnecessary. She has a hard time getting her Husband in and out of places and was wondering if you could give her a call to find out what else can be done.

## 2021-04-04 NOTE — Progress Notes (Signed)
Continuous pulse ox ordered. Connected pt to bedside pulse ox monitor.  ?

## 2021-04-04 NOTE — TOC Initial Note (Signed)
Transition of Care [facility]) - Initial/Assessment Note    Patient Details  Name: Jon Williams MRN: [id number] Date of Birth: 08-03-1950  [facility]) CM/SW Contact:    Lennart Pall, LCSW Phone Number: 04/04/2021, 12:48 PM  Clinical Narrative:                 Met with pt and wife today to introduce self/ TOC role and discuss anticipated dc needs.  Both so very pleasant and motivated to improve overall strength with therapies while here and return home where wife will be primary caregiver.  Noted that MD has placed orders for HHPT/OT and they are agreeable with no agency preferences. Have placed referral with [facility].  Both anticipate transfer to another unit shortly - will alert oncoming TOC coverage of [place] arrangements made already.  Expected Discharge Plan: [place] Barriers to Discharge: Continued Medical Work up   Patient Goals and CMS Choice Patient states their goals for this hospitalization and ongoing recovery are:: return home and eventually to resuming some coaching   Choice offered to / list presented to : Patient  Expected Discharge Plan and Services Expected Discharge Plan: [place] Acute Care Choice: [place] arrangements for the past 2 months: Single Family Home                           HH Arranged: PT, OT HH Agency: [place] Date [facility] Agency Contacted: 04/04/21 Time HH Agency Contacted: 64 Representative spoke with at [place]: Tommi Rumps  Prior Living Arrangements/Services Living arrangements for the past 2 months: [place] with:: Spouse Patient language and need for interpreter reviewed:: Yes Do you feel safe going back to the place where you live?: Yes      Need for Family Participation in Patient Care: Yes (Comment) Care giver support system in place?: Yes (comment)   Criminal Activity/Legal Involvement Pertinent to Current Situation/Hospitalization: No -  Comment as needed  Activities of Daily Living Home Assistive Devices/Equipment: Walker (specify type) ADL Screening (condition at time of admission) Patient's cognitive ability adequate to safely complete daily activities?: Yes Is the patient deaf or have difficulty hearing?: No Does the patient have difficulty seeing, even when wearing glasses/contacts?: No Does the patient have difficulty concentrating, remembering, or making decisions?: No Patient able to express need for assistance with ADLs?: Yes Does the patient have difficulty dressing or bathing?: No Independently performs ADLs?: Yes (appropriate for developmental age) Does the patient have difficulty walking or climbing stairs?: No Weakness of Legs: None Weakness of Arms/Hands: None  Permission Sought/Granted Permission sought to share information with : Family Supports Permission granted to share information with : Yes, Verbal Permission Granted  Share Information with NAME: Maude Gloor     Permission granted to share info w Relationship: spouse  Permission granted to share info w Contact Information: [phone]  Emotional Assessment Appearance:: Appears stated age Attitude/Demeanor/Rapport: Engaged, Gracious, Self-Confident Affect (typically observed): Accepting, Hopeful Orientation: : Oriented to Self, Oriented to Place, Oriented to  Time, Oriented to Situation Alcohol / Substance Use: Not Applicable Psych Involvement: No (comment)  Admission diagnosis:  SIRS (systemic inflammatory response syndrome) (HCC) [R65.10] AKI (acute kidney injury) ([place]) [N17.9] Nausea vomiting and diarrhea [R11.2, R19.7] Patient Active Problem List   Diagnosis Date Noted   Hypotension due to hypovolemia 04/04/2021   Nausea vomiting and diarrhea 04/04/2021  Thrombocytopenia ([place]) 04/04/2021   Leukocytosis 04/04/2021   Acute renal failure superimposed on stage 3a chronic kidney disease ([place]) 04/04/2021   Diarrhea 04/01/2021    Occult blood in stools 04/01/2021   Foot ulcer, right ([place]) 04/01/2021   Hypotension 04/01/2021   Dehydration 04/01/2021   Iron deficiency anemia due to chronic blood loss 12/12/2020   Gastric cancer ([place]) 12/08/2020   Symptomatic anemia 11/30/2020   Gastrointestinal hemorrhage    Corns and callosities 08/07/2019   Blood clotting disorder ([place]) 05/06/2019   Coagulation defect ([place]) 05/06/2019   Pacemaker lead malfunction 10/10/2018   Pacemaker complications 15/40/0867   Pacemaker lead failure, sequela 10/10/2018   S/P knee replacement 08/16/2017   S/P total knee arthroplasty 08/16/2017   Malignant neoplasm of prostate ([place]) 04/02/2017   ICD (implantable cardioverter-defibrillator) in place    Persistent atrial fibrillation ([place]) 05/25/2016   Atrial fibrillation ([place]) 05/25/2016   Coronary artery disease involving native coronary artery without angina pectoris 04/17/2016   Other secondary pulmonary hypertension ([place]) 04/17/2016   Chronic combined systolic and diastolic CHF (congestive heart failure) ([place])    NICM (nonischemic cardiomyopathy) ([place])    LBBB (left bundle branch block)    PCP:  [name], MD Pharmacy:   [facility], [place] [address] Phone: [phone] Fax: [phone]  [id number] - [place], [place] [address] [place] [address] Phone: [phone] Fax: [phone]  [facility] [id number] - [place] [address] [place] [address] Phone: [phone] Fax: [phone]     Social Determinants of Health (SDOH) Interventions    Readmission Risk Interventions Readmission Risk Prevention Plan 04/04/2021  Transportation Screening Complete  Medication Review (RN Care Manager) Complete  PCP or Specialist appointment within  3-5 days of discharge Complete  HRI or [place] Complete  SW Recovery Care/Counseling Consult Complete  [place] Not Applicable  Some recent data might be hidden

## 2021-04-04 NOTE — Progress Notes (Signed)
Occupational Therapy Treatment Patient Details Name: Jon Williams MRN: [id number] DOB: 1950/09/21 Today's Date: 04/04/2021   History of present illness Pt admitted from home with weakness, dehydration, diarrhea, sepsis, SIRS adn with hx of CHF, DM, DJD, afib, cardiomegly, nonischemic cardiomyopathy, AICD, R TKR, LBBB, prostate CE and gastric CA with last chemo 03/22/21   OT comments  Patient reporting increased weakness in bilateral grip 3-/5. Instruct patient in B UE exercises listed below and provided with squeeze ball to continue to work on strengthening throughout the day. Patient needing min A for transfer to recliner chair, with almost ataxic like steps having difficulty with motor control and stepping too close to walker frame.    Recommendations for follow up therapy are one component of a multi-disciplinary discharge planning process, led by the attending physician.  Recommendations may be updated based on patient status, additional functional criteria and insurance authorization.    Follow Up Recommendations  Home health OT    Assistance Recommended at Discharge Frequent or constant Supervision/Assistance  Patient can return home with the following  A little help with walking and/or transfers;A little help with bathing/dressing/bathroom;Assistance with cooking/housework;Direct supervision/assist for financial management;Direct supervision/assist for medications management;Help with stairs or ramp for entrance;Assist for transportation   Equipment Recommendations  None recommended by OT       Precautions / Restrictions Precautions Precautions: Fall Restrictions Weight Bearing Restrictions: No       Mobility Bed Mobility Overal bed mobility: Needs Assistance Bed Mobility: Supine to Sit     Supine to sit: Min assist, HOB elevated     General bed mobility comments: min A to raise trunk       Balance Overall balance assessment: Needs assistance Sitting-balance  support: Feet supported Sitting balance-Leahy Scale: Good     Standing balance support: Bilateral upper extremity supported, Reliant on assistive device for balance Standing balance-Leahy Scale: Poor                             ADL either performed or assessed with clinical judgement   ADL Overall ADL's : Needs assistance/impaired                     Lower Body Dressing: Total assistance;Bed level Lower Body Dressing Details (indicate cue type and reason): Patient's spouse don socks Toilet Transfer: Minimal assistance;Stand-pivot;Cueing for safety;Cueing for sequencing;Rolling walker (2 wheels) Toilet Transfer Details (indicate cue type and reason): To recliner chair. Patient with ataxic appearing steps taking large steps too close to frame of walker. Needing min A for balance         Functional mobility during ADLs: Minimal assistance;Cueing for safety;Cueing for sequencing;Rolling walker (2 wheels)        Cognition Arousal/Alertness: Awake/alert Behavior During Therapy: WFL for tasks assessed/performed Overall Cognitive Status: Within Functional Limits for tasks assessed                                          Exercises Exercises: General Upper Extremity, Other exercises General Exercises - Upper Extremity Shoulder Flexion: AROM, Both, Seated, 20 reps Elbow Flexion: AROM, Both, 20 reps, Seated Elbow Extension: AROM, Both, 20 reps, Seated Digit Composite Flexion: Both, 20 reps, Seated, Strengthening Composite Extension: Strengthening, Seated, Both (20 reps) Other Exercises Other Exercises: Provided patient with squeeze ball at end of session  General Comments BP EOB 103/70    Pertinent Vitals/ Pain       Pain Assessment Pain Assessment: Faces Faces Pain Scale: Hurts little more Pain Location: shoulders with exercise Pain Descriptors / Indicators: Aching, Sore Pain Intervention(s): Monitored during session          Frequency  Min 2X/week        Progress Toward Goals  OT Goals(current goals can now be found in the care plan section)  Progress towards OT goals: Progressing toward goals  Acute Rehab OT Goals Patient Stated Goal: Stronger grip OT Goal Formulation: With patient Time For Goal Achievement: 04/16/21 Potential to Achieve Goals: Good ADL Goals Pt Will Perform Lower Body Dressing: with modified independence;sit to/from stand;sitting/lateral leans Pt Will Transfer to Toilet: with modified independence;ambulating;regular height toilet Pt Will Perform Toileting - Clothing Manipulation and hygiene: with modified independence;sitting/lateral leans;sit to/from stand Additional ADL Goal #1: Patient will perform 10 min functional activity or exercise activity as evidence of improving activity tolerance  Plan Discharge plan remains appropriate       AM-PAC OT "6 Clicks" Daily Activity     Outcome Measure   Help from another person eating meals?: A Little Help from another person taking care of personal grooming?: A Little Help from another person toileting, which includes using toliet, bedpan, or urinal?: A Lot Help from another person bathing (including washing, rinsing, drying)?: A Lot Help from another person to put on and taking off regular upper body clothing?: A Lot Help from another person to put on and taking off regular lower body clothing?: A Lot 6 Click Score: 14    End of Session Equipment Utilized During Treatment: Gait belt;Rolling walker (2 wheels)  OT Visit Diagnosis: Unsteadiness on feet (R26.81);Other abnormalities of gait and mobility (R26.89)   Activity Tolerance Patient tolerated treatment well   Patient Left in chair;with call bell/phone within reach;with chair alarm set;with family/visitor present   Nurse Communication Mobility status        Time: 6812-7517 OT Time Calculation (min): 23 min  Charges: OT General Charges $OT Visit: 1 Visit OT  Treatments $Self Care/Home Management : 8-22 mins $Therapeutic Exercise: 8-22 mins  Delbert Phenix OT OT pager: [name] 04/04/2021, 12:22 PM

## 2021-04-04 NOTE — Telephone Encounter (Signed)
Sch per 2/20 inbasket, pt wife aware

## 2021-04-04 NOTE — Telephone Encounter (Signed)
I have no idea. NO one does house calls

## 2021-04-04 NOTE — Telephone Encounter (Signed)
Patient has been notified thru vmessage, please contact patient to schedule appointment

## 2021-04-04 NOTE — Hospital Course (Addendum)
71yo m with complex medical history of systolic CAD status CHF EF 20 to 25%, T2DM, A-fib not on anticoagulation, CKD stage IIIa baseline creatinine 1.3, hypertension, HLD, chronic anemia, gastric cancer- last chemo 03/22/21 presented with  abdomen pain/cramping,  vomiting and diarrhea x2 days with decreased oral intake, no fever, denies sick contacts. Treated for hypotension/lactic acidosis/leukocytosis due to dehydration from nausea, vomiting and diarrhea.Thought to be due to tumor related infection work-up with UA + blood culture urine cultures negative, followed by oncology.  Being conservatively managed for nausea vomiting diarrhea.CT abdomen In ED w/ focal wall thickening along the posterior aspect of the gastric antrum has resolved no evidence of metastatic disease likely indicating good response to chemo.Started tolerating diet.Oncology decided to hold on chemotherapy and most likely he will proceed with his gastric surgery. antibiotics stopped and monitored off antibiotics, cbc trended.

## 2021-04-04 NOTE — Progress Notes (Signed)
PROGRESS NOTE Jon Williams  GQQ:[id number] DOB: [birth year] DOA: 04/01/2021 PCP: [name], MD   Brief Narrative/Hospital Course: 71yo m with complex medical history of systolic CAD status CHF EF 20 to 25%, T2DM, A-fib not on anticoagulation, CKD stage IIIa baseline creatinine 1.3, hypertension, HLD, chronic anemia, gastric cancer- last chemo 03/22/21 presented with  abdomen pain/cramping,  vomiting and diarrhea x2 days with decreased oral intake, no fever, denies sick contacts.   Treated for hypotension/lactic acidosis/leukocytosis due to dehydration from nausea, vomiting and diarrhea.Thought to be due to tumor related infection work-up with UA + blood culture urine cultures negative, followed by oncology.  Being conservatively managed for nausea vomiting diarrhea.  CT abdomen/15 focal wall thickening along the posterior aspect of the gastric antrum has resolved no evidence of metastatic disease likely indicating good response to chemo.Started tolerating diet.Oncology decided to hold on chemotherapy and most likely he will proceed with his gastric surgery.     Subjective: Seen this am Had small BM yesterday waiting for food, legs feel heavy No new complaints, on RA. Waiting for MED SURG BED, No fever, wbc still up, ON  dysphagia 3 diet  Assessment and Plan:  Severe hypotension due to hypovolemia: Nausea vomiting diarrhea Leukocytosis:: Vital signs stable.nausea vomiting and diarrhea improved.  Antihypertensive remains on hold.  Encourage oral intake.  Placed on empiric ANTIBIOTICS CEFEPIME>Zosyn due to concern for possible infection, culture data negative so far. LEUCOCYTOSIS Could be viral infection versus reactive in the setting of chemo possible effect from the G-CSF.  Still elevated but downtrending. PROCA was 0.6.Will discuss with hematology to see if We can hold off on IV antibiotics today and monitor off antibiotics Recent Labs  Lab 04/01/21 1430 04/01/21 1746 04/01/21 1918  04/01/21 2330 04/01/21 2336 04/02/21 0031 04/02/21 0530 04/03/21 0501 04/04/21 0340  WBC 24.0*  --   --  19.3*  --   --  15.7* 17.8* 16.0*  LATICACIDVEN 3.7* 7.6*  --   --  1.6 1.3 1.1  --   --   PROCALCITON  --   --  0.63  --   --   --   --   --   --     Lactic acidosis due to dehydration hypertension  Heme positive stool: Could be in the setting of diarrhea, no significant GI bleeding seen by GI no intervention needed.  Gastric cancer last chemo on 2/8: followed by hematology oncology holding chemo for now and plan for outpatient follow-up with surgery/ONCO  Hypokalemia resolved  Acute renal failure superimposed on stage 3a chronic kidney disease due to hypotension and dehydration, CT imaging no hydronephrosis.  Improved to 1.3-1.4.  Encourage oral intake.  Wean off IV fluids once tolerating diet. Recent Labs  Lab 04/01/21 1430 04/02/21 0530 04/03/21 0501 04/04/21 0340  BUN 22 20 16 14   CREATININE 2.49* 1.69* 1.31* 1.44*    Chronic combined systolic and diastolic CHF CAD Persistent A-fib Status post BiV ICD in 2018: No chest pain, no fluid overload.Admitted with volume depletion.  Home Coreg started on Lasix Entresto were held.  Slowly resume as blood pressure improves.  Continue statin.  Right foot ulcer followed by podiatry.  Did not infected, wound care to follow-up  Thrombocytopenia: Likely from chemo.  Platelet in 80s to 100.  Monitor Recent Labs  Lab 04/01/21 1430 04/01/21 2330 04/02/21 0530 04/03/21 0501 04/04/21 0340  PLT 114* 103* 97* 88* 85*      Class I Obesity:Patient's Body mass index is 30.46 kg/m. :  Will benefit with PCP follow-up, weight loss  healthy lifestyle and outpatient sleep evaluation.  DVT prophylaxis: SCDs Start: 04/01/21 2154.  nO Chemical prophylaxis due to positive stool thrombocytopenia Code Status:   Code Status: DNR Family Communication: plan of care discussed with patient/WIFE at bedside.  Disposition: Currently NOT medically  stable for discharge. Status is: Inpatient Remains inpatient appropriate because: Ongoing management of GI issues Planning for home with home health PT on discharge  Objective: Vitals last 24 hrs: Vitals:   04/04/21 0200 04/04/21 0300 04/04/21 0346 04/04/21 0400  BP: (!) 119/98   101/71  Pulse:      Resp: 20 15  18   Temp:   98 F (36.7 C)   TempSrc:   Oral   SpO2:      Weight:      Height:       Weight change:   Physical Examination: General exam: AAOX3,older than stated age, weak appearing. HEENT:Oral mucosa moist, Ear/Nose WNL grossly, dentition normal. Respiratory system: bilaterally diminished BS, no use of accessory muscle Cardiovascular system: S1 & S2 +, No JVD,. Gastrointestinal system: Abdomen soft,NT,ND, BS+ Nervous System:Alert, awake, moving extremities and grossly nonfocal Extremities: LE edema NONE,distal peripheral pulses palpable.  Skin: No rashes,no icterus. MSK: Normal muscle bulk,tone, power  Medications reviewed:  Scheduled Meds:  atorvastatin  40 mg Oral Daily   Chlorhexidine Gluconate Cloth  6 each Topical QHS   insulin aspart  0-9 Units Subcutaneous Q4H   magic mouthwash w/lidocaine  5 mL Oral TID AC   mouth rinse  15 mL Mouth Rinse BID   pantoprazole (PROTONIX) IV  40 mg Intravenous Q12H   Continuous Infusions:  sodium chloride Stopped (04/02/21 1812)   piperacillin-tazobactam (ZOSYN)  IV Stopped (04/04/21 0529)      Diet Order             DIET DYS 3 Room service appropriate? Yes; Fluid consistency: Thin  Diet effective now                   Intake/Output Summary (Last 24 hours) at 04/04/2021 0736 Last data filed at 04/04/2021 0539 Gross per 24 hour  Intake 606.81 ml  Output --  Net 606.81 ml   Net IO Since Admission: 1,119.35 mL [04/04/21 0736]  Wt Readings from Last 3 Encounters:  04/01/21 85.6 kg  03/22/21 87.6 kg  03/13/21 90.2 kg     Unresulted Labs (From admission, onward)     Start     Ordered   04/05/21 4235   Basic metabolic panel  Daily,   R     Question:  Specimen collection method  Answer:  Unit=Unit collect   04/04/21 0731   04/05/21 0500  CBC  Daily,   R     Question:  Specimen collection method  Answer:  Unit=Unit collect   04/04/21 0731          Data Reviewed: I have personally reviewed following labs and imaging studies CBC: Recent Labs  Lab 04/01/21 1430 04/01/21 2330 04/02/21 0530 04/03/21 0501 04/04/21 0340  WBC 24.0* 19.3* 15.7* 17.8* 16.0*  NEUTROABS 20.6*  --  13.5* 15.7*  --   HGB 12.5* 11.4* 11.3* 10.6* 11.0*  HCT 38.5* 35.8* 34.8* 32.8* 34.3*  MCV 95.1 96.5 95.9 96.2 97.2  PLT 114* 103* 97* 88* 85*   Basic Metabolic Panel: Recent Labs  Lab 04/01/21 1430 04/01/21 1918 04/02/21 0530 04/03/21 0501 04/04/21 0340  NA 134*  --  136 136 138  K  3.6  --  3.1* 3.1* 3.5  CL 100  --  104 106 109  CO2 22  --  23 21* 20*  GLUCOSE 133*  --  94 97 107*  BUN 22  --  20 16 14   CREATININE 2.49*  --  1.69* 1.31* 1.44*  CALCIUM 9.1  --  8.5* 8.2* 8.4*  MG 1.8  --  1.7 1.6*  --   PHOS  --  4.0 4.4  --   --    GFR: Estimated Creatinine Clearance: 47.6 mL/min (A) (by C-G formula based on SCr of 1.44 mg/dL (H)). Liver Function Tests: Recent Labs  Lab 04/01/21 1430 04/01/21 1918 04/02/21 0530  AST 46* 38 34  ALT 28 26 25   ALKPHOS 65 62 54  BILITOT 0.6 0.6 0.8  PROT 7.4 6.5 6.0*  ALBUMIN 4.2 3.8 3.4*   Recent Labs  Lab 04/01/21 1430  LIPASE 66*   No results for input(s): AMMONIA in the last 168 hours. Coagulation Profile: No results for input(s): INR, PROTIME in the last 168 hours. Cardiac Enzymes: Recent Labs  Lab 04/01/21 1918  CKTOTAL 163   BNP (last 3 results) No results for input(s): PROBNP in the last 8760 hours. HbA1C: Recent Labs    04/01/21 2055  HGBA1C 6.3*   CBG: Recent Labs  Lab 04/03/21 1138 04/03/21 1613 04/03/21 1941 04/03/21 2316 04/04/21 0339  GLUCAP 113* 122* 117* 112* 106*   Lipid Profile: No results for input(s): CHOL,  HDL, LDLCALC, TRIG, CHOLHDL, LDLDIRECT in the last 72 hours. Thyroid Function Tests: Recent Labs    04/01/21 1920  TSH 0.473   Anemia Panel: No results for input(s): VITAMINB12, FOLATE, FERRITIN, TIBC, IRON, RETICCTPCT in the last 72 hours. Sepsis Labs: Recent Labs  Lab 04/01/21 1746 04/01/21 1918 04/01/21 2336 04/02/21 0031 04/02/21 0530  PROCALCITON  --  0.63  --   --   --   LATICACIDVEN 7.6*  --  1.6 1.3 1.1    Recent Results (from the past 240 hour(s))  Culture, blood (routine x 2)     Status: None (Preliminary result)   Collection Time: 04/01/21  2:30 PM   Specimen: BLOOD  Result Value Ref Range Status   Specimen Description   Final    BLOOD PORTA CATH Performed at [facility], [place] [address], [place], [place] [address]    Special Requests   Final    BOTTLES DRAWN AEROBIC AND ANAEROBIC Blood Culture adequate volume Performed at [place] [address] [place], [place] [address]    Culture   Final    NO GROWTH 2 DAYS Performed at [place] [address], [place], [place] [address]    Report Status PENDING  Incomplete  Culture, blood (routine x 2)     Status: None (Preliminary result)   Collection Time: 04/01/21  3:17 PM   Specimen: BLOOD  Result Value Ref Range Status   Specimen Description   Final    BLOOD PORTA CATH Performed at [facility] [address], [place], [place] [address]    Special Requests   Final    BOTTLES DRAWN AEROBIC AND ANAEROBIC Blood Culture adequate volume Performed at [place] [address], [place], [place] [address]    Culture   Final    NO GROWTH 2 DAYS Performed at [place] [address] [place], [place] [address]    Report Status PENDING  Incomplete  C Difficile Quick Screen w PCR reflex     Status: None   Collection Time: 04/01/21  4:12 PM   Specimen: STOOL  Result Value Ref Range Status   C Diff antigen  NEGATIVE NEGATIVE Final   C Diff toxin NEGATIVE NEGATIVE Final   C Diff interpretation No C. difficile detected.  Final    Comment: Performed at [facility], [place] [address] [place], [place] [address]  Gastrointestinal Panel by PCR , Stool     Status: None   Collection Time: 04/01/21  4:12 PM   Specimen: STOOL  Result Value Ref Range Status   Campylobacter species NOT DETECTED NOT DETECTED Final   Plesimonas shigelloides NOT DETECTED NOT DETECTED Final   Salmonella species NOT DETECTED NOT DETECTED Final   Yersinia enterocolitica NOT DETECTED NOT DETECTED Final   Vibrio species NOT DETECTED NOT DETECTED Final   Vibrio cholerae NOT DETECTED NOT DETECTED Final   Enteroaggregative E coli (EAEC) NOT DETECTED NOT DETECTED Final   Enteropathogenic E coli (EPEC) NOT DETECTED NOT DETECTED Final   Enterotoxigenic E coli (ETEC) NOT DETECTED NOT DETECTED Final   Shiga like toxin producing E coli (STEC) NOT DETECTED NOT DETECTED Final   Shigella/Enteroinvasive E coli (EIEC) NOT DETECTED NOT DETECTED Final   Cryptosporidium NOT DETECTED NOT DETECTED Final   Cyclospora cayetanensis NOT DETECTED NOT DETECTED Final   Entamoeba histolytica NOT DETECTED NOT DETECTED Final   Giardia lamblia NOT DETECTED NOT DETECTED Final   Adenovirus F40/41 NOT DETECTED NOT DETECTED Final   Astrovirus NOT DETECTED NOT DETECTED Final   Norovirus GI/GII NOT DETECTED NOT DETECTED Final   Rotavirus A NOT DETECTED NOT DETECTED Final   Sapovirus (I, II, IV, and V) NOT DETECTED NOT DETECTED Final    Comment: Performed at [facility], [address], [place], [place] [address]  Urine Culture     Status: None   Collection Time: 04/01/21  7:35 PM   Specimen: Urine, Clean Catch  Result Value Ref Range Status   Specimen Description   Final    URINE, CLEAN CATCH Performed at [facility], [place] [address], [place], [place] [address]    Special Requests   Final    NONE Performed  at [facility], [place] [address], [place], [place] [address]    Culture   Final    NO GROWTH Performed at [facility], [place] [address] [place], [place] [address]    Report Status 04/03/2021 FINAL  Final  MRSA Next Gen by PCR, Nasal     Status: None   Collection Time: 04/01/21  9:50 PM   Specimen: Nasal Mucosa; Nasal Swab  Result Value Ref Range Status   MRSA by PCR Next Gen NOT DETECTED NOT DETECTED Final    Comment: (NOTE) The GeneXpert MRSA Assay (FDA approved for NASAL specimens only), is one component of a comprehensive MRSA colonization surveillance program. It is not intended to diagnose MRSA infection nor to guide or monitor treatment for MRSA infections. Test performance is not FDA approved in patients less than 51 years old. Performed at [facility], [place] [address], [place], [place] [address]   Resp Panel by RT-PCR (Flu A&B, Covid) Nasopharyngeal Swab     Status: None   Collection Time: 04/01/21 11:57 PM   Specimen: Nasopharyngeal Swab; Nasopharyngeal(NP) swabs in vial transport medium  Result Value Ref Range Status   SARS Coronavirus 2 by RT PCR NEGATIVE NEGATIVE Final  Comment: (NOTE) SARS-CoV-2 target nucleic acids are NOT DETECTED.  The SARS-CoV-2 RNA is generally detectable in upper respiratory specimens during the acute phase of infection. The lowest concentration of SARS-CoV-2 viral copies this assay can detect is 138 copies/mL. A negative result does not preclude SARS-Cov-2 infection and should not be used as the sole basis for treatment or other patient management decisions. A negative result may occur with  improper specimen collection/handling, submission of specimen other than nasopharyngeal swab, presence of viral mutation(s) within the areas targeted by this assay, and inadequate number of viral copies(<138 copies/mL). A negative result must be combined with clinical observations, patient history, and  epidemiological information. The expected result is Negative.  Fact Sheet for Patients:  EntrepreneurPulse.com.au  Fact Sheet for Healthcare Providers:  IncredibleEmployment.be  This test is no t yet approved or cleared by the [place] FDA and  has been authorized for detection and/or diagnosis of SARS-CoV-2 by FDA under an Emergency Use Authorization (EUA). This EUA will remain  in effect (meaning this test can be used) for the duration of the COVID-19 declaration under Section 564(b)(1) of the Act, 21 U.S.C.section 360bbb-3(b)(1), unless the authorization is terminated  or revoked sooner.       Influenza A by PCR NEGATIVE NEGATIVE Final   Influenza B by PCR NEGATIVE NEGATIVE Final    Comment: (NOTE) The Xpert Xpress SARS-CoV-2/FLU/RSV plus assay is intended as an aid in the diagnosis of influenza from Nasopharyngeal swab specimens and should not be used as a sole basis for treatment. Nasal washings and aspirates are unacceptable for Xpert Xpress SARS-CoV-2/FLU/RSV testing.  Fact Sheet for Patients: EntrepreneurPulse.com.au  Fact Sheet for Healthcare Providers: IncredibleEmployment.be  This test is not yet approved or cleared by the [place] FDA and has been authorized for detection and/or diagnosis of SARS-CoV-2 by FDA under an Emergency Use Authorization (EUA). This EUA will remain in effect (meaning this test can be used) for the duration of the COVID-19 declaration under Section 564(b)(1) of the Act, 21 U.S.C. section 360bbb-3(b)(1), unless the authorization is terminated or revoked.  Performed at [facility], [place] [address], [place], [place] [address]     Antimicrobials: Anti-infectives (From admission, onward)    Start     Dose/Rate Route Frequency Ordered Stop   04/03/21 0900  piperacillin-tazobactam (ZOSYN) IVPB 3.375 g        3.375 g 12.5 mL/hr over 240  Minutes Intravenous Every 8 hours 04/03/21 0811     04/02/21 1600  ceFEPIme (MAXIPIME) 2 g in sodium chloride 0.9 % 100 mL IVPB  Status:  Discontinued        2 g 200 mL/hr over 30 Minutes Intravenous Every 24 hours 04/01/21 1940 04/02/21 1332   04/02/21 1430  ceFEPIme (MAXIPIME) 2 g in sodium chloride 0.9 % 100 mL IVPB  Status:  Discontinued        2 g 200 mL/hr over 30 Minutes Intravenous Every 12 hours 04/02/21 1332 04/03/21 0745   04/02/21 0600  metroNIDAZOLE (FLAGYL) IVPB 500 mg  Status:  Discontinued        500 mg 100 mL/hr over 60 Minutes Intravenous Every 12 hours 04/01/21 1918 04/03/21 0811   04/01/21 1615  ceFEPIme (MAXIPIME) 2 g in sodium chloride 0.9 % 100 mL IVPB        2 g 200 mL/hr over 30 Minutes Intravenous  Once 04/01/21 1611 04/01/21 1722   04/01/21 1615  metroNIDAZOLE (FLAGYL) IVPB 500 mg  500 mg 100 mL/hr over 60 Minutes Intravenous  Once 04/01/21 1611 04/01/21 1732   04/01/21 1530  ceFEPIme (MAXIPIME) 2 g in sodium chloride 0.9 % 100 mL IVPB  Status:  Discontinued        2 g 200 mL/hr over 30 Minutes Intravenous  Once 04/01/21 1528 04/01/21 1534   04/01/21 1530  metroNIDAZOLE (FLAGYL) IVPB 500 mg  Status:  Discontinued        500 mg 100 mL/hr over 60 Minutes Intravenous  Once 04/01/21 1528 04/01/21 1534      Culture/Microbiology    Component Value Date/Time   SDES  04/01/2021 1935    URINE, CLEAN CATCH Performed at [facility], [place] [address] [place], [place] [address]    [place]  04/01/2021 1935    NONE Performed at [facility], [place] [address], [place], [place] [address]    CULT  04/01/2021 1935    NO GROWTH Performed at [place] [address], [place], [place] [address]    REPTSTATUS 04/03/2021 FINAL 04/01/2021 1935   Radiology Studies: ECHOCARDIOGRAM COMPLETE  Result Date: 04/02/2021    ECHOCARDIOGRAM REPORT   Patient Name:   Jon Williams Date of Exam: 04/02/2021 Medical Rec #:   [id number]        Height:       66.0 in Accession #:    [id number]       Weight:       188.7 lb Date of Birth:  08/02/1950         BSA:          1.951 m Patient Age:    35 years         BP:           83/49 mmHg Patient Gender: M                HR:           83 bpm. Exam Location:  Inpatient Procedure: 2D Echo, Cardiac Doppler and Color Doppler Indications:    I42.1 HYPERTHROPHIC CARDIOMYOPATHY  History:        Patient has prior history of Echocardiogram examinations, most                 recent 12/07/2020. Cardiomyopathy, Cardiomegaly and CHF,                 Arrythmias:LBBB and Atrial Fibrillation; Risk Factors:Diabetes.                 PULM. HTN / NICM.  Sonographer:    Beryle Beams Referring Phys: [place]  1. Left ventricular ejection fraction, by estimation, is 20 to 25%. The left ventricle has severely decreased function. The left ventricle demonstrates global hypokinesis. Left ventricular diastolic parameters are indeterminate.  2. RV not well visualized, grossly appears normal in size and function. . Right ventricular systolic function was not well visualized. The right ventricular size is not well visualized. Tricuspid regurgitation signal is inadequate for assessing PA pressure.  3. Left atrial size was mildly dilated.  4. The mitral valve is abnormal. Mild mitral valve regurgitation. No evidence of mitral stenosis.  5. The aortic valve was not well visualized. Aortic valve regurgitation is not visualized. No aortic stenosis is present.  6. The inferior vena cava is normal in size with greater than 50% respiratory variability, suggesting right atrial pressure of 3 mmHg. FINDINGS  Left Ventricle: Left ventricular ejection fraction, by estimation,  is 20 to 25%. The left ventricle has severely decreased function. The left ventricle demonstrates global hypokinesis. The left ventricular internal cavity size was normal in size. There is no left ventricular hypertrophy. Left ventricular  diastolic parameters are indeterminate. Right Ventricle: RV not well visualized, grossly appears normal in size and function. The right ventricular size is not well visualized. Right vetricular wall thickness was not well visualized. Right ventricular systolic function was not well visualized.  Tricuspid regurgitation signal is inadequate for assessing PA pressure. Left Atrium: Left atrial size was mildly dilated. Right Atrium: Right atrial size was normal in size. Pericardium: There is no evidence of pericardial effusion. Mitral Valve: The mitral valve is abnormal. Mild mitral valve regurgitation. No evidence of mitral valve stenosis. MV peak gradient, 38.7 mmHg. The mean mitral valve gradient is 28.0 mmHg. Tricuspid Valve: The tricuspid valve is not well visualized. Tricuspid valve regurgitation is not demonstrated. No evidence of tricuspid stenosis. Aortic Valve: The aortic valve was not well visualized. Aortic valve regurgitation is not visualized. No aortic stenosis is present. Aortic valve mean gradient measures 2.0 mmHg. Aortic valve peak gradient measures 4.5 mmHg. Aortic valve area, by VTI measures 1.18 cm. Pulmonic Valve: The pulmonic valve was not well visualized. Pulmonic valve regurgitation is not visualized. No evidence of pulmonic stenosis. Aorta: The aortic root is normal in size and structure. Venous: The inferior vena cava is normal in size with greater than 50% respiratory variability, suggesting right atrial pressure of 3 mmHg. IAS/Shunts: No atrial level shunt detected by color flow Doppler.  LEFT VENTRICLE PLAX 2D LVIDd:         4.70 cm LVIDs:         5.00 cm LV PW:         1.00 cm LV IVS:        0.90 cm LVOT diam:     1.80 cm LV SV:         20 LV SV Index:   10 LVOT Area:     2.54 cm  LV Volumes (MOD) LV vol d, MOD A2C: 183.0 ml LV vol d, MOD A4C: 183.0 ml LV vol s, MOD A2C: 117.0 ml LV vol s, MOD A4C: 157.0 ml LV SV MOD A2C:     66.0 ml LV SV MOD A4C:     183.0 ml LV SV MOD BP:      47.0 ml  RIGHT VENTRICLE            IVC RV S prime:     8.49 cm/s  IVC diam: 1.60 cm TAPSE (M-mode): 1.8 cm LEFT ATRIUM             Index        RIGHT ATRIUM           Index LA diam:        4.10 cm 2.10 cm/m   RA Area:     19.00 cm LA Vol (A2C):   83.5 ml 42.80 ml/m  RA Volume:   56.00 ml  28.70 ml/m LA Vol (A4C):   63.6 ml 32.60 ml/m LA Biplane Vol: 77.1 ml 39.52 ml/m  AORTIC VALVE                    PULMONIC VALVE AV Area (Vmax):    1.40 cm     PV Vmax:       0.51 m/s AV Area (Vmean):   1.42 cm     PV Vmean:  29.700 cm/s AV Area (VTI):     1.18 cm     PV VTI:        0.091 m AV Vmax:           106.00 cm/s  PV Peak grad:  1.0 mmHg AV Vmean:          73.100 cm/s  PV Mean grad:  0.0 mmHg AV VTI:            0.166 m AV Peak Grad:      4.5 mmHg AV Mean Grad:      2.0 mmHg LVOT Vmax:         58.40 cm/s LVOT Vmean:        40.700 cm/s LVOT VTI:          0.077 m LVOT/AV VTI ratio: 0.46  AORTA Ao Root diam: 3.10 cm Ao Asc diam:  2.70 cm MITRAL VALVE MV Area (PHT): 5.54 cm    SHUNTS MV Area VTI:   0.15 cm    Systemic VTI:  0.08 m MV Peak grad:  38.7 mmHg   Systemic Diam: 1.80 cm MV Mean grad:  28.0 mmHg MV Vmax:       3.11 m/s MV Vmean:      253.0 cm/s MV Decel Time: 137 msec MV E velocity: 49.70 cm/s MV A velocity: 19.70 cm/s MV E/A ratio:  2.52 Carlyle Dolly MD Electronically signed by Carlyle Dolly MD Signature Date/Time: 04/02/2021/11:41:53 AM    Final      LOS: 3 days   Antonieta Pert, MD [facility]  04/04/2021, 7:36 AM

## 2021-04-05 ENCOUNTER — Other Ambulatory Visit: Payer: Medicare HMO

## 2021-04-05 ENCOUNTER — Ambulatory Visit: Payer: Medicare HMO | Admitting: Nurse Practitioner

## 2021-04-05 ENCOUNTER — Ambulatory Visit: Payer: Medicare HMO

## 2021-04-05 DIAGNOSIS — E861 Hypovolemia: Secondary | ICD-10-CM | POA: Diagnosis not present

## 2021-04-05 DIAGNOSIS — I9589 Other hypotension: Secondary | ICD-10-CM | POA: Diagnosis not present

## 2021-04-05 LAB — BASIC METABOLIC PANEL
Anion gap: 8 (ref 5–15)
BUN: 14 mg/dL (ref 8–23)
CO2: 21 mmol/L — ABNORMAL LOW (ref 22–32)
Calcium: 8.3 mg/dL — ABNORMAL LOW (ref 8.9–10.3)
Chloride: 108 mmol/L (ref 98–111)
Creatinine, Ser: 1.42 mg/dL — ABNORMAL HIGH (ref 0.61–1.24)
GFR, Estimated: 53 mL/min — ABNORMAL LOW (ref >60)
Glucose, Bld: 110 mg/dL — ABNORMAL HIGH (ref 70–99)
Potassium: 3.2 mmol/L — ABNORMAL LOW (ref 3.5–5.1)
Sodium: 137 mmol/L (ref 135–145)

## 2021-04-05 LAB — GLUCOSE, CAPILLARY
Glucose-Capillary: 106 mg/dL — ABNORMAL HIGH (ref 70–99)
Glucose-Capillary: 108 mg/dL — ABNORMAL HIGH (ref 70–99)
Glucose-Capillary: 118 mg/dL — ABNORMAL HIGH (ref 70–99)
Glucose-Capillary: 120 mg/dL — ABNORMAL HIGH (ref 70–99)
Glucose-Capillary: 136 mg/dL — ABNORMAL HIGH (ref 70–99)
Glucose-Capillary: 139 mg/dL — ABNORMAL HIGH (ref 70–99)

## 2021-04-05 LAB — CBC
HCT: 33 % — ABNORMAL LOW (ref 39.0–52.0)
Hemoglobin: 10.8 g/dL — ABNORMAL LOW (ref 13.0–17.0)
MCH: 31.5 pg (ref 26.0–34.0)
MCHC: 32.7 g/dL (ref 30.0–36.0)
MCV: 96.2 fL (ref 80.0–100.0)
Platelets: 82 10*3/uL — ABNORMAL LOW (ref 150–400)
RBC: 3.43 MIL/uL — ABNORMAL LOW (ref 4.22–5.81)
RDW: 21.1 % — ABNORMAL HIGH (ref 11.5–15.5)
WBC: 16.3 10*3/uL — ABNORMAL HIGH (ref 4.0–10.5)
nRBC: 0.4 % — ABNORMAL HIGH (ref 0.0–0.2)

## 2021-04-05 MED ORDER — POTASSIUM CHLORIDE CRYS ER 20 MEQ PO TBCR
40.0000 meq | EXTENDED_RELEASE_TABLET | ORAL | Status: AC
  Administered 2021-04-05 (×2): 40 meq via ORAL
  Filled 2021-04-05 (×2): qty 2

## 2021-04-05 MED ORDER — PANTOPRAZOLE SODIUM 40 MG PO TBEC
40.0000 mg | DELAYED_RELEASE_TABLET | Freq: Two times a day (BID) | ORAL | Status: DC
  Administered 2021-04-05 – 2021-04-06 (×2): 40 mg via ORAL
  Filled 2021-04-05 (×2): qty 1

## 2021-04-05 NOTE — Progress Notes (Signed)
°   04/05/21 1200  Mobility  Activity Stood at bedside;Transferred from bed to chair  Range of Motion/Exercises Right leg;Left leg;Active  Level of Assistance Contact guard assist, steadying assist  Assistive Device Front wheel walker  Activity Response Tolerated fair  $Mobility charge 1 Mobility   Pt agreeable to mobilize this morning. Had pt sit EOB for a couple minutes before standing. He stood bedside for about 1 minute before he stated he needed to sit secondary to chest discomfort. Checks pt BP and O2 sat levels. All within stable limits. Had pt sit Eob until he felt able to stand again. Pt stood at bedside for 5 minutes and sat EOB. Pt completed this again after resting for a couple minutes. Pt then sat Eob and competed exercises outlined below. Left pt in chair with legs raised on the bed, call bell at side. RN/NT notified of session.   Exercises Sitting marches x10/leg Leg extensions x10/leg Dorsiflexion x10/side  Mobility Specialist - Progress Note After 1st stand: BP 112/75, Sp O2 99% While standing: BP 125/101, Sp O2 100% After session: BP 121/85, Sp O2 100%   [place] Specialist Acute Rehab Services Office: [phone]

## 2021-04-05 NOTE — Discharge Summary (Signed)
Physician Discharge Summary   Patient: Jon Williams MRN: [id number] DOB: 1950/09/12  Admit date:     04/01/2021  Discharge date: 04/06/21  Discharge Physician: Antonieta Pert   PCP: [name], MD   Recommendations at discharge:    Fu with oncology, pcp in 1 wk Cbc bmp in 1 wk Follow-up with cardiology    Hospital Course: 71yo m with complex medical history of systolic CAD status CHF EF 20 to 25%, T2DM, A-fib not on anticoagulation, CKD stage IIIa baseline creatinine 1.3, hypertension, HLD, chronic anemia, gastric cancer- last chemo 03/22/21 presented with  abdomen pain/cramping,  vomiting and diarrhea x2 days with decreased oral intake, no fever, denies sick contacts. Treated for hypotension/lactic acidosis/leukocytosis due to dehydration from nausea, vomiting and diarrhea.Thought to be due to tumor related infection work-up with UA + blood culture urine cultures negative, followed by oncology.  Being conservatively managed for nausea vomiting diarrhea.CT abdomen In ED w/ focal wall thickening along the posterior aspect of the gastric antrum has resolved no evidence of metastatic disease likely indicating good response to chemo.Started tolerating diet.Oncology decided to hold on chemotherapy and most likely he will proceed with his gastric surgery. antibiotics stopped and monitored off antibiotics, cbc trended. At this time patient feels a stable having ambulated bowel movement afebrile.  He feels well enough to go home today discussed with hematology oncology team and being discharged home  Discharge Diagnoses:  Severe hypotension due to hypovolemia: Nausea vomiting diarrhea Leukocytosis:: Clinically stable afebrile nausea vomiting diarrhea improved.  Sepsis ruled out, off Zosyn and remained afebrile WBC count downtrending. There is no evidence of infection.  Blood culture no growth to date. Leukocytosis still present could be reactive versus viral infection versus due to patient's G-CS  inj.PROCA was 0.6.discussed with hematology-okay for discharge check CBC in 1 week from PCP and hematology.   Recent Labs  Lab 04/01/21 1430 04/01/21 1746 04/01/21 1918 04/01/21 2330 04/01/21 2336 04/02/21 0031 04/02/21 0530 04/03/21 0501 04/04/21 0340 04/05/21 0416 04/06/21 0347  WBC 24.0*  --   --    < >  --   --  15.7* 17.8* 16.0* 16.3* 15.8*  LATICACIDVEN 3.7* 7.6*  --   --  1.6 1.3 1.1  --   --   --   --   PROCALCITON  --   --  0.63  --   --   --   --   --   --   --   --    < > = values in this interval not displayed.      Lactic acidosis due to dehydration hypertension  Heme positive stool: Could be in the setting of diarrhea, no significant GI bleeding seen by GI no intervention needed. HH stable.  Follow-up outpatient   Gastric cancer last chemo on 2/8:followed by hematology oncology holding chemo for now and plan for outpatient follow-up with surgery/ONCO.  No history of   Hypokalemia: Repleted   Acute renal failure superimposed on stage 3a chronic kidney disease due to hypotension and dehydration, CT imaging no hydronephrosis.  Creatinine improved and is stable. Encourage oral hydration. Stable now.   Chronic combined systolic and diastolic CHF CAD Persistent A-fib Status post BiV ICD in 2018: No chest pain, no fluid overload.Admitted with volume depletion.  Patient will resume Coreg Lasix Entresto and other cardiac medication and follow-up with his cardiologist.  Continue statin monitor weight at home Right foot ulcer followed by podiatry.  IT IS not infected, wound care to  follow-up   Thrombocytopenia: Likely from chemo.  Platelet in 80s to 100.  Continue to monitor Recent Labs  Lab 04/02/21 0530 04/03/21 0501 04/04/21 0340 04/05/21 0416 04/06/21 0347  PLT 97* 88* 85* 82* 94*      Class I Obesity:Patient's Body mass index is 30.46 kg/m. : Will benefit with PCP follow-up, weight loss  healthy lifestyle and outpatient sleep evaluation      Consultants:  hem onc Procedures performed: none  Disposition: Home Diet recommendation:  Regular diet  DISCHARGE MEDICATION: Allergies as of 04/06/2021       Reactions   Aspirin Anaphylaxis, Hives   Sulfa Antibiotics Anaphylaxis, Hives   Other Other (See Comments)        Medication List     TAKE these medications    atorvastatin 40 MG tablet Commonly known as: LIPITOR TAKE 1 TABLET(40 MG) BY MOUTH DAILY What changed: See the new instructions.   carvedilol 3.125 MG tablet Commonly known as: COREG TAKE 1 TABLET(3.125 MG) BY MOUTH TWICE DAILY What changed: See the new instructions.   cholecalciferol 25 MCG (1000 UNIT) tablet Commonly known as: VITAMIN D3 Take 1,000 Units by mouth daily with lunch.   CLEAR EYES OP Place 1 drop into both eyes daily as needed (irritation).   dexamethasone 4 MG tablet Commonly known as: DECADRON Take 2 tablets (8 mg total) by mouth daily. Take 1 tab daily starting the day after chemotherapy for 2 days. Take with food.   docusate sodium 100 MG capsule Commonly known as: COLACE Take 1 capsule (100 mg total) by mouth 2 (two) times daily as needed for mild constipation.   Entresto 24-26 MG Generic drug: sacubitril-valsartan Take 1 tablet by mouth 2 (two) times daily.   furosemide 40 MG tablet Commonly known as: LASIX TAKE 1 TABLET(40 MG) BY MOUTH TWICE DAILY What changed: See the new instructions.   lidocaine-prilocaine cream Commonly known as: EMLA Apply to affected area once   metFORMIN 500 MG 24 hr tablet Commonly known as: GLUCOPHAGE-XR Take 500 mg by mouth every evening.   multivitamin with minerals Tabs tablet Take 1 tablet by mouth daily with lunch. One-A-Day   ondansetron 8 MG tablet Commonly known as: Zofran Take 1 tablet (8 mg total) by mouth 2 (two) times daily as needed for refractory nausea / vomiting. Start on day 3 after chemotherapy.   pantoprazole 40 MG tablet Commonly known as: PROTONIX Take 1 tablet (40 mg total) by  mouth 2 (two) times daily.   polyethylene glycol 17 g packet Commonly known as: MIRALAX / GLYCOLAX Take 17 g by mouth daily as needed for moderate constipation.   potassium chloride 10 MEQ tablet Commonly known as: KLOR-CON TAKE 1 TABLET(10 MEQ) BY MOUTH TWICE DAILY What changed: See the new instructions.   prochlorperazine 10 MG tablet Commonly known as: COMPAZINE Take 1 tablet (10 mg total) by mouth every 6 (six) hours as needed (Nausea or vomiting).   sotalol 80 MG tablet Commonly known as: BETAPACE Take 1.5 tablets (120 mg total) by mouth in the morning and at bedtime.               Durable Medical Equipment  (From admission, onward)           Start     Ordered   04/06/21 0000  For home use only DME 4 wheeled rolling walker with seat       Question:  Patient needs a walker to treat with the following condition  Answer:  Physical deconditioning   04/06/21 0951            Follow-up Information     [facility], [facility] Follow up.   Specialty: [place] Why: to provide home physical and occupational therapy Contact information: [address] [place] [address] [phone]                 Discharge Exam: Danley Danker Weights   04/01/21 1434 04/01/21 2130  Weight: 88.9 kg 85.6 kg   Condition at discharge: good  The results of significant diagnostics from this hospitalization (including imaging, microbiology, ancillary and laboratory) are listed below for reference.   Imaging Studies: CT ABDOMEN PELVIS WO CONTRAST  Result Date: 04/01/2021 CLINICAL DATA:  Gastric cancer. Abdominal pain and cramping. Nausea vomiting and diarrhea. EXAM: CT ABDOMEN AND PELVIS WITHOUT CONTRAST TECHNIQUE: Multidetector CT imaging of the abdomen and pelvis was performed following the standard protocol without IV contrast. RADIATION DOSE REDUCTION: This exam was performed according to the departmental dose-optimization program which includes  automated exposure control, adjustment of the mA and/or kV according to patient size and/or use of iterative reconstruction technique. COMPARISON:  12/01/2020 FINDINGS: Lower chest: Unremarkable. Hepatobiliary: No suspicious focal abnormality in the liver on this study without intravenous contrast. There is no evidence for gallstones, gallbladder wall thickening, or pericholecystic fluid. No intrahepatic or extrahepatic biliary dilation. Pancreas: No focal mass lesion. No dilatation of the main duct. No intraparenchymal cyst. No peripancreatic edema. Spleen: No splenomegaly. No focal mass lesion. Adrenals/Urinary Tract: Nodular thickening of the right adrenal gland is stable. 16 mm left adrenal nodule on 20/2 is low density compatible with adenoma, unchanged in the interval. Kidneys unremarkable. No evidence for hydroureter. The urinary bladder appears normal for the degree of distention. Stomach/Bowel: Stomach is unremarkable. No gastric wall thickening. No evidence of outlet obstruction. Duodenum is normally positioned as is the ligament of Treitz. No small bowel wall thickening. No small bowel dilatation. The terminal ileum is normal. The appendix is normal. No gross colonic mass. No colonic wall thickening. A few scattered left colonic diverticuli noted without diverticulitis. Vascular/Lymphatic: There is mild atherosclerotic calcification of the abdominal aorta without aneurysm. There is no gastrohepatic or hepatoduodenal ligament lymphadenopathy. No retroperitoneal or mesenteric lymphadenopathy. No pelvic sidewall lymphadenopathy. Reproductive: Fiducial markers noted in the prostate gland. Other: No intraperitoneal free fluid. Musculoskeletal: Right groin hernia contains only fat. Small fat containing umbilical hernia evident. No worrisome lytic or sclerotic osseous abnormality. Bilateral pars interarticularis defects noted at L4. 10 mm anterolisthesis of L4 on 5 is stable. IMPRESSION: 1. No acute findings in  the abdomen or pelvis. Specifically, no evidence for metastatic disease in the abdomen or pelvis. 2. Stable 16 mm left adrenal adenoma. 3. Small fat containing hernias in the right groin and umbilical region. 4. Bilateral pars interarticularis defects at L4 with 10 mm anterolisthesis of L4 on 5, stable. 5. Aortic Atherosclerosis (ICD10-I70.0). Electronically Signed   By: Misty Stanley M.D.   On: 04/01/2021 17:35   DG Chest Port 1 View  Result Date: 04/01/2021 CLINICAL DATA:  Weakness, shortness of breath. History of cancer with last chemotherapy on 03/22/2021. EXAM: PORTABLE CHEST 1 VIEW COMPARISON:  Chest radiograph 12/23/2020. FINDINGS: Right power injectable port central venous catheter tip projects at the level of the mid SVC. Left chest wall pacemaker with leads overlying the right atrium and right ventricle. Lungs are clear. No pleural effusion or pneumothorax. Cardiomegaly, mildly decreased compared to prior exam on 12/23/2020. IMPRESSION:  No acute cardiopulmonary abnormality. Cardiomegaly again demonstrated, though less severe compared to prior radiograph 12/23/2020. Electronically Signed   By: Ileana Roup M.D.   On: 04/01/2021 15:51   VAS [place] ABI WITH/WO TBI  Result Date: 03/29/2021  LOWER EXTREMITY DOPPLER STUDY Patient Name:  MASAMI PLATA  Date of Exam:   03/28/2021 Medical Rec #: [id number]         Accession #:    [id number] Date of Birth: November 29, 1950          Patient Gender: M Patient Age:   76 years Exam Location:  [facility] Procedure:      VAS [place] ABI WITH/WO TBI Referring Phys: Mariea Clonts REGAL --------------------------------------------------------------------------------  Indications: Patient presents from Podiatry for a right first metatarsal              abrasion. The patient has structural deformities with significant              structural bunions. The wound started in January after he had worn              inappropriate shoes and created an abrasion. The patient states              that  since yesterday he has experienced increase pain and              sensitivity of the right heel and was in a great deal of discomfort              during the exam. High Risk Factors: No history of smoking, coronary artery disease. Other Factors: Chronic systolic heart failure, gastric cancer, atrial                fibrillation.  Comparison Study: Previous ABIs performed at [facility] on 09/09/20 showed right ABI of                   0.98 and TBI of 0.35, and left ABI of 1.03 and TBI of 0.35 Performing Technologist: Mariane Masters RVT  Examination Guidelines: A complete evaluation includes at minimum, Doppler waveform signals and systolic blood pressure reading at the level of bilateral brachial, anterior tibial, and posterior tibial arteries, when vessel segments are accessible. Bilateral testing is considered an integral part of a complete examination. Photoelectric Plethysmograph (PPG) waveforms and toe systolic pressure readings are included as required and additional duplex testing as needed. Limited examinations for reoccurring indications may be performed as noted.  ABI Findings: +---------+------------------+-----+-----------+--------+  Right     Rt Pressure (mmHg) Index Waveform    Comment   +---------+------------------+-----+-----------+--------+  Brachial  113                                            +---------+------------------+-----+-----------+--------+  PTA       108                0.96  triphasic             +---------+------------------+-----+-----------+--------+  PERO      105                0.93  multiphasic           +---------+------------------+-----+-----------+--------+  DP        106                0.94  triphasic             +---------+------------------+-----+-----------+--------+  Great Toe 50                 0.44  Abnormal              +---------+------------------+-----+-----------+--------+ +---------+------------------+-----+---------+-------+  Left      Lt Pressure  (mmHg) Index Waveform  Comment  +---------+------------------+-----+---------+-------+  Brachial  106                                         +---------+------------------+-----+---------+-------+  PTA       138                1.22  triphasic          +---------+------------------+-----+---------+-------+  PERO      102                0.90  triphasic          +---------+------------------+-----+---------+-------+  DP        133                1.18  triphasic          +---------+------------------+-----+---------+-------+  Great Toe 61                 0.54  Abnormal           +---------+------------------+-----+---------+-------+ +-------+-----------+-----------+------------+------------+  ABI/TBI Today's ABI Today's TBI Previous ABI Previous TBI  +-------+-----------+-----------+------------+------------+  Right   0.96        0.44        0.98         0.35          +-------+-----------+-----------+------------+------------+  Left    1.22        0.54        1.03         0.35          +-------+-----------+-----------+------------+------------+ Bilateral ABIs and TBIs appear essentially unchanged compared to prior study on 09/09/20.  Summary: Right: Resting right ankle-brachial index is within normal range. No evidence of significant right lower extremity arterial disease. The right toe-brachial index is abnormal. Left: Resting left ankle-brachial index is within normal range. No evidence of significant left lower extremity arterial disease. The left toe-brachial index is abnormal.  *See table(s) above for measurements and observations.  Electronically signed by Ida Rogue MD on 03/29/2021 at 8:07:42 AM.    Final    ECHOCARDIOGRAM COMPLETE  Result Date: 04/02/2021    ECHOCARDIOGRAM REPORT   Patient Name:   DEVON PRETTY Date of Exam: 04/02/2021 Medical Rec #:  [id number]        Height:       66.0 in Accession #:    [id number]       Weight:       188.7 lb Date of Birth:  1950-09-02         BSA:          1.951 m  Patient Age:    35 years         BP:           83/49 mmHg Patient Gender: M                HR:           83 bpm. Exam Location:  Inpatient Procedure: 2D Echo, Cardiac Doppler and Color Doppler Indications:    I42.1 HYPERTHROPHIC CARDIOMYOPATHY  History:  Patient has prior history of Echocardiogram examinations, most                 recent 12/07/2020. Cardiomyopathy, Cardiomegaly and CHF,                 Arrythmias:LBBB and Atrial Fibrillation; Risk Factors:Diabetes.                 PULM. HTN / NICM.  Sonographer:    Beryle Beams Referring Phys: [place]  1. Left ventricular ejection fraction, by estimation, is 20 to 25%. The left ventricle has severely decreased function. The left ventricle demonstrates global hypokinesis. Left ventricular diastolic parameters are indeterminate.  2. RV not well visualized, grossly appears normal in size and function. . Right ventricular systolic function was not well visualized. The right ventricular size is not well visualized. Tricuspid regurgitation signal is inadequate for assessing PA pressure.  3. Left atrial size was mildly dilated.  4. The mitral valve is abnormal. Mild mitral valve regurgitation. No evidence of mitral stenosis.  5. The aortic valve was not well visualized. Aortic valve regurgitation is not visualized. No aortic stenosis is present.  6. The inferior vena cava is normal in size with greater than 50% respiratory variability, suggesting right atrial pressure of 3 mmHg. FINDINGS  Left Ventricle: Left ventricular ejection fraction, by estimation, is 20 to 25%. The left ventricle has severely decreased function. The left ventricle demonstrates global hypokinesis. The left ventricular internal cavity size was normal in size. There is no left ventricular hypertrophy. Left ventricular diastolic parameters are indeterminate. Right Ventricle: RV not well visualized, grossly appears normal in size and function. The right ventricular  size is not well visualized. Right vetricular wall thickness was not well visualized. Right ventricular systolic function was not well visualized.  Tricuspid regurgitation signal is inadequate for assessing PA pressure. Left Atrium: Left atrial size was mildly dilated. Right Atrium: Right atrial size was normal in size. Pericardium: There is no evidence of pericardial effusion. Mitral Valve: The mitral valve is abnormal. Mild mitral valve regurgitation. No evidence of mitral valve stenosis. MV peak gradient, 38.7 mmHg. The mean mitral valve gradient is 28.0 mmHg. Tricuspid Valve: The tricuspid valve is not well visualized. Tricuspid valve regurgitation is not demonstrated. No evidence of tricuspid stenosis. Aortic Valve: The aortic valve was not well visualized. Aortic valve regurgitation is not visualized. No aortic stenosis is present. Aortic valve mean gradient measures 2.0 mmHg. Aortic valve peak gradient measures 4.5 mmHg. Aortic valve area, by VTI measures 1.18 cm. Pulmonic Valve: The pulmonic valve was not well visualized. Pulmonic valve regurgitation is not visualized. No evidence of pulmonic stenosis. Aorta: The aortic root is normal in size and structure. Venous: The inferior vena cava is normal in size with greater than 50% respiratory variability, suggesting right atrial pressure of 3 mmHg. IAS/Shunts: No atrial level shunt detected by color flow Doppler.  LEFT VENTRICLE PLAX 2D LVIDd:         4.70 cm LVIDs:         5.00 cm LV PW:         1.00 cm LV IVS:        0.90 cm LVOT diam:     1.80 cm LV SV:         20 LV SV Index:   10 LVOT Area:     2.54 cm  LV Volumes (MOD) LV vol d, MOD A2C: 183.0 ml LV vol d, MOD A4C: 183.0 ml LV  vol s, MOD A2C: 117.0 ml LV vol s, MOD A4C: 157.0 ml LV SV MOD A2C:     66.0 ml LV SV MOD A4C:     183.0 ml LV SV MOD BP:      47.0 ml RIGHT VENTRICLE            IVC RV S prime:     8.49 cm/s  IVC diam: 1.60 cm TAPSE (M-mode): 1.8 cm LEFT ATRIUM             Index        RIGHT  ATRIUM           Index LA diam:        4.10 cm 2.10 cm/m   RA Area:     19.00 cm LA Vol (A2C):   83.5 ml 42.80 ml/m  RA Volume:   56.00 ml  28.70 ml/m LA Vol (A4C):   63.6 ml 32.60 ml/m LA Biplane Vol: 77.1 ml 39.52 ml/m  AORTIC VALVE                    PULMONIC VALVE AV Area (Vmax):    1.40 cm     PV Vmax:       0.51 m/s AV Area (Vmean):   1.42 cm     PV Vmean:      29.700 cm/s AV Area (VTI):     1.18 cm     PV VTI:        0.091 m AV Vmax:           106.00 cm/s  PV Peak grad:  1.0 mmHg AV Vmean:          73.100 cm/s  PV Mean grad:  0.0 mmHg AV VTI:            0.166 m AV Peak Grad:      4.5 mmHg AV Mean Grad:      2.0 mmHg LVOT Vmax:         58.40 cm/s LVOT Vmean:        40.700 cm/s LVOT VTI:          0.077 m LVOT/AV VTI ratio: 0.46  AORTA Ao Root diam: 3.10 cm Ao Asc diam:  2.70 cm MITRAL VALVE MV Area (PHT): 5.54 cm    SHUNTS MV Area VTI:   0.15 cm    Systemic VTI:  0.08 m MV Peak grad:  38.7 mmHg   Systemic Diam: 1.80 cm MV Mean grad:  28.0 mmHg MV Vmax:       3.11 m/s MV Vmean:      253.0 cm/s MV Decel Time: 137 msec MV E velocity: 49.70 cm/s MV A velocity: 19.70 cm/s MV E/A ratio:  2.52 Carlyle Dolly MD Electronically signed by Carlyle Dolly MD Signature Date/Time: 04/02/2021/11:41:53 AM    Final    VAS [place] LOWER EXTREMITY ARTERIAL DUPLEX  Result Date: 03/29/2021 LOWER EXTREMITY ARTERIAL DUPLEX STUDY Patient Name:  CARLTON BUSKEY  Date of Exam:   03/28/2021 Medical Rec #: [id number]         Accession #:    [id number] Date of Birth: [birth year]          Patient Gender: M Patient Age:   28 years Exam Location:  [facility] Procedure:      VAS [place] LOWER EXTREMITY ARTERIAL DUPLEX Referring Phys: Mariea Clonts REGAL --------------------------------------------------------------------------------  Indications: Patient presents from Podiatry for a right first metatarsal  abrasion. The patient has structural deformities with significant              structural bunions. The wound started in January after  he had worn              inappropriate shoes and created an abrasion. The patient states              that since yesterday he has experienced increase pain and              sensitivity of the right heel and was in a great deal of discomfort              during the exam. High Risk Factors: No history of smoking, coronary artery disease. Other Factors: Chronic systolic heart failure, gastric cancer, atrial                fibrillation.  Current ABI: Right-0.96              Left- 1.22 Performing Technologist: Mariane Masters RVT  Examination Guidelines: A complete evaluation includes B-mode imaging, spectral Doppler, color Doppler, and power Doppler as needed of all accessible portions of each vessel. Bilateral testing is considered an integral part of a complete examination. Limited examinations for reoccurring indications may be performed as noted.  +----------+--------+-----+--------+---------+--------+  RIGHT      PSV cm/s Ratio Stenosis Waveform  Comments  +----------+--------+-----+--------+---------+--------+  CFA Prox   63                      triphasic           +----------+--------+-----+--------+---------+--------+  DFA        40                      triphasic           +----------+--------+-----+--------+---------+--------+  SFA Prox   41                      triphasic           +----------+--------+-----+--------+---------+--------+  SFA Mid    79                      triphasic           +----------+--------+-----+--------+---------+--------+  SFA Distal 27                      triphasic           +----------+--------+-----+--------+---------+--------+  POP Prox   29                      triphasic           +----------+--------+-----+--------+---------+--------+  POP Distal 42                      triphasic           +----------+--------+-----+--------+---------+--------+  TP Trunk   45                      triphasic           +----------+--------+-----+--------+---------+--------+  ATA Prox   39                       triphasic           +----------+--------+-----+--------+---------+--------+  ATA Mid  43                      triphasic           +----------+--------+-----+--------+---------+--------+  ATA Distal 0              occluded                     +----------+--------+-----+--------+---------+--------+  PTA Prox   69                      triphasic           +----------+--------+-----+--------+---------+--------+  PTA Mid    133                     triphasic           +----------+--------+-----+--------+---------+--------+  PTA Distal 50                      triphasic           +----------+--------+-----+--------+---------+--------+  PERO Mid   22                      triphasic           +----------+--------+-----+--------+---------+--------+ 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 Unable to fully visualize the peroneal artery but the mid segment is patent with triphasic flow.  Summary: Right: Widely patent common femoral, superficial femoral and popliteal arteries. 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  See table(s) above for measurements and observations. See ABI report. Electronically signed by Ida Rogue MD on 03/29/2021 at 8:13:16 AM.    Final     Microbiology: Results for orders placed or performed during the hospital encounter of 04/01/21  Culture, blood (routine x 2)     Status: None   Collection Time: 04/01/21  2:30 PM   Specimen: BLOOD  Result Value Ref Range Status   Specimen Description   Final    BLOOD PORTA CATH Performed at [facility], [place] [address], [place], [place] [address]    Special Requests   Final    BOTTLES DRAWN AEROBIC AND ANAEROBIC Blood Culture adequate volume Performed at [place] [address] [place], [place] [address]    Culture   Final    NO GROWTH 5 DAYS Performed at [facility],  [place] [address], [place], [place] [address]    Report Status 04/06/2021 FINAL  Final  Culture, blood (routine x 2)     Status: None   Collection Time: 04/01/21  3:17 PM   Specimen: BLOOD  Result Value Ref Range Status   Specimen Description   Final    BLOOD PORTA CATH Performed at [facility] [address], [place], [place] [address]    Special Requests   Final    BOTTLES DRAWN AEROBIC AND ANAEROBIC Blood Culture adequate volume Performed at [place] [address] [place], [place] [address]    Culture   Final    NO GROWTH 5 DAYS Performed at [facility], [place] [address] [place],  [address]    Report Status 04/06/2021 FINAL  Final  C Difficile Quick Screen w PCR reflex     Status: None   Collection Time: 04/01/21  4:12 PM   Specimen:  STOOL  Result Value Ref Range Status   C Diff antigen NEGATIVE NEGATIVE Final   C Diff toxin NEGATIVE NEGATIVE Final   C Diff interpretation No C. difficile detected.  Final    Comment: Performed at [facility], [place] [address] [place], [place] [address]  Gastrointestinal Panel by PCR , Stool     Status: None   Collection Time: 04/01/21  4:12 PM   Specimen: STOOL  Result Value Ref Range Status   Campylobacter species NOT DETECTED NOT DETECTED Final   Plesimonas shigelloides NOT DETECTED NOT DETECTED Final   Salmonella species NOT DETECTED NOT DETECTED Final   Yersinia enterocolitica NOT DETECTED NOT DETECTED Final   Vibrio species NOT DETECTED NOT DETECTED Final   Vibrio cholerae NOT DETECTED NOT DETECTED Final   Enteroaggregative E coli (EAEC) NOT DETECTED NOT DETECTED Final   Enteropathogenic E coli (EPEC) NOT DETECTED NOT DETECTED Final   Enterotoxigenic E coli (ETEC) NOT DETECTED NOT DETECTED Final   Shiga like toxin producing E coli (STEC) NOT DETECTED NOT DETECTED Final   Shigella/Enteroinvasive E coli (EIEC) NOT DETECTED NOT DETECTED Final   Cryptosporidium NOT  DETECTED NOT DETECTED Final   Cyclospora cayetanensis NOT DETECTED NOT DETECTED Final   Entamoeba histolytica NOT DETECTED NOT DETECTED Final   Giardia lamblia NOT DETECTED NOT DETECTED Final   Adenovirus F40/41 NOT DETECTED NOT DETECTED Final   Astrovirus NOT DETECTED NOT DETECTED Final   Norovirus GI/GII NOT DETECTED NOT DETECTED Final   Rotavirus A NOT DETECTED NOT DETECTED Final   Sapovirus (I, II, IV, and V) NOT DETECTED NOT DETECTED Final    Comment: Performed at [facility], [address] [place], [place] [address]  Urine Culture     Status: None   Collection Time: 04/01/21  7:35 PM   Specimen: Urine, Clean Catch  Result Value Ref Range Status   Specimen Description   Final    URINE, CLEAN CATCH Performed at [facility], [place] [address], [place], [place] [address]    Special Requests   Final    NONE Performed at [facility], [place] [address], [place], [place] [address]    Culture   Final    NO GROWTH Performed at [facility], [place] [address] [place], [place] [address]    Report Status 04/03/2021 FINAL  Final  MRSA Next Gen by PCR, Nasal     Status: None   Collection Time: 04/01/21  9:50 PM   Specimen: Nasal Mucosa; Nasal Swab  Result Value Ref Range Status   MRSA by PCR Next Gen NOT DETECTED NOT DETECTED Final    Comment: (NOTE) The GeneXpert MRSA Assay (FDA approved for NASAL specimens only), is one component of a comprehensive MRSA colonization surveillance program. It is not intended to diagnose MRSA infection nor to guide or monitor treatment for MRSA infections. Test performance is not FDA approved in patients less than 24 years old. Performed at [facility], [place] [address] [place], [place] [address]   Resp Panel by RT-PCR (Flu A&B, Covid) Nasopharyngeal Swab     Status: None   Collection Time: 04/01/21 11:57 PM   Specimen: Nasopharyngeal Swab; Nasopharyngeal(NP) swabs in vial  transport medium  Result Value Ref Range Status   SARS Coronavirus 2 by RT PCR NEGATIVE NEGATIVE Final    Comment: (NOTE) SARS-CoV-2 target nucleic acids are NOT DETECTED.  The SARS-CoV-2 RNA is generally detectable in upper respiratory specimens during the  acute phase of infection. The lowest concentration of SARS-CoV-2 viral copies this assay can detect is 138 copies/mL. A negative result does not preclude SARS-Cov-2 infection and should not be used as the sole basis for treatment or other patient management decisions. A negative result may occur with  improper specimen collection/handling, submission of specimen other than nasopharyngeal swab, presence of viral mutation(s) within the areas targeted by this assay, and inadequate number of viral copies(<138 copies/mL). A negative result must be combined with clinical observations, patient history, and epidemiological information. The expected result is Negative.  Fact Sheet for Patients:  EntrepreneurPulse.com.au  Fact Sheet for Healthcare Providers:  IncredibleEmployment.be  This test is no t yet approved or cleared by the [place] FDA and  has been authorized for detection and/or diagnosis of SARS-CoV-2 by FDA under an Emergency Use Authorization (EUA). This EUA will remain  in effect (meaning this test can be used) for the duration of the COVID-19 declaration under Section 564(b)(1) of the Act, 21 U.S.C.section 360bbb-3(b)(1), unless the authorization is terminated  or revoked sooner.       Influenza A by PCR NEGATIVE NEGATIVE Final   Influenza B by PCR NEGATIVE NEGATIVE Final    Comment: (NOTE) The Xpert Xpress SARS-CoV-2/FLU/RSV plus assay is intended as an aid in the diagnosis of influenza from Nasopharyngeal swab specimens and should not be used as a sole basis for treatment. Nasal washings and aspirates are unacceptable for Xpert Xpress SARS-CoV-2/FLU/RSV testing.  Fact  Sheet for Patients: EntrepreneurPulse.com.au  Fact Sheet for Healthcare Providers: IncredibleEmployment.be  This test is not yet approved or cleared by the [place] FDA and has been authorized for detection and/or diagnosis of SARS-CoV-2 by FDA under an Emergency Use Authorization (EUA). This EUA will remain in effect (meaning this test can be used) for the duration of the COVID-19 declaration under Section 564(b)(1) of the Act, 21 U.S.C. section 360bbb-3(b)(1), unless the authorization is terminated or revoked.  Performed at [facility], [place] [address] [place], [place] [address]     Labs: CBC: Recent Labs  Lab 04/01/21 1430 04/01/21 2330 04/02/21 0530 04/03/21 0501 04/04/21 0340 04/05/21 0416 04/06/21 0347  WBC 24.0*   < > 15.7* 17.8* 16.0* 16.3* 15.8*  NEUTROABS 20.6*  --  13.5* 15.7*  --   --   --   HGB 12.5*   < > 11.3* 10.6* 11.0* 10.8* 10.3*  HCT 38.5*   < > 34.8* 32.8* 34.3* 33.0* 30.7*  MCV 95.1   < > 95.9 96.2 97.2 96.2 96.5  PLT 114*   < > 97* 88* 85* 82* 94*   < > = values in this interval not displayed.   Basic Metabolic Panel: Recent Labs  Lab 04/01/21 1430 04/01/21 1918 04/02/21 0530 04/03/21 0501 04/04/21 0340 04/05/21 0416 04/06/21 0347  NA 134*  --  136 136 138 137  --   K 3.6  --  3.1* 3.1* 3.5 3.2* 3.6  CL 100  --  104 106 109 108  --   CO2 22  --  23 21* 20* 21*  --   GLUCOSE 133*  --  94 97 107* 110*  --   BUN 22  --  20 16 14 14   --   CREATININE 2.49*  --  1.69* 1.31* 1.44* 1.42*  --   CALCIUM 9.1  --  8.5* 8.2* 8.4* 8.3*  --   MG 1.8  --  1.7 1.6*  --   --   --  PHOS  --  4.0 4.4  --   --   --   --    Liver Function Tests: Recent Labs  Lab 04/01/21 1430 04/01/21 1918 04/02/21 0530  AST 46* 38 34  ALT 28 26 25   ALKPHOS 65 62 54  BILITOT 0.6 0.6 0.8  PROT 7.4 6.5 6.0*  ALBUMIN 4.2 3.8 3.4*   CBG: Recent Labs  Lab 04/05/21 1735 04/05/21 1947 04/05/21 2350  04/06/21 0346 04/06/21 0742  GLUCAP 118* 136* 105* 99 110*    Discharge time spent: 35 minutes.  Signed: Antonieta Pert, MD [facility] 04/06/2021

## 2021-04-05 NOTE — Progress Notes (Signed)
PHARMACIST - PHYSICIAN COMMUNICATION DR:   Lupita Leash CONCERNING: Proton Pump Inhibitor IV to Oral Route Change Policy  The patient is receiving Protonix by the intravenous route. Based on criteria approved by the Pharmacy and [place], the medication is being converted to the equivalent oral dose form.   These criteria include:  -No Active GI bleeding  -Able to tolerate diet of full liquids (or better) or tube feeding  -Able to tolerate other medications by the oral or enteral route   If you have any questions about this conversion, please contact the Pharmacy Department ([phone] Thank you.  Peggyann Juba, PharmD, BCPS 04/05/2021 1:40 PM

## 2021-04-05 NOTE — Progress Notes (Signed)
PROGRESS NOTE Jon Williams  TWS:[id number] DOB: 1950/06/01 DOA: 04/01/2021 PCP: [name], MD   Brief Narrative/Hospital Course: 71yo m with complex medical history of systolic CAD status CHF EF 20 to 25%, T2DM, A-fib not on anticoagulation, CKD stage IIIa baseline creatinine 1.3, hypertension, HLD, chronic anemia, gastric cancer- last chemo 03/22/21 presented with  abdomen pain/cramping,  vomiting and diarrhea x2 days with decreased oral intake, no fever, denies sick contacts. Treated for hypotension/lactic acidosis/leukocytosis due to dehydration from nausea, vomiting and diarrhea.Thought to be due to tumor related infection work-up with UA + blood culture urine cultures negative, followed by oncology.  Being conservatively managed for nausea vomiting diarrhea.CT abdomen In ED w/ focal wall thickening along the posterior aspect of the gastric antrum has resolved no evidence of metastatic disease likely indicating good response to chemo.Started tolerating diet.Oncology decided to hold on chemotherapy and most likely he will proceed with his gastric surgery.     Subjective:  Seen and examined this morning.  Overnight afebrile, on room air had a bowel movement-yesterday but wife reports she did not know if it is solid or not  Assessment and Plan:  Severe hypotension due to hypovolemia: Nausea vomiting diarrhea Leukocytosis:: Clinically stable afebrile nausea vomiting diarrhea improved.  Sepsis ruled out, will discontinue Zosyn monitor without antibiotics as there is no evidence of infection.  Blood culture no growth to date. Leukocytosis still present could be reactive versus viral infection versus due to patient's G-CS inj.PROCA was 0.6.discussed with hematology.   Recent Labs  Lab 04/01/21 1430 04/01/21 1746 04/01/21 1918 04/01/21 2330 04/01/21 2336 04/02/21 0031 04/02/21 0530 04/03/21 0501 04/04/21 0340 04/05/21 0416  WBC 24.0*  --   --  19.3*  --   --  15.7* 17.8* 16.0* 16.3*   LATICACIDVEN 3.7* 7.6*  --   --  1.6 1.3 1.1  --   --   --   PROCALCITON  --   --  0.63  --   --   --   --   --   --   --      Lactic acidosis due to dehydration hypertension  Heme positive stool: Could be in the setting of diarrhea, no significant GI bleeding seen by GI no intervention needed. HH stable. Recent Labs  Lab 04/01/21 2330 04/02/21 0530 04/03/21 0501 04/04/21 0340 04/05/21 0416  HGB 11.4* 11.3* 10.6* 11.0* 10.8*  HCT 35.8* 34.8* 32.8* 34.3* 33.0*    Gastric cancer last chemo on 2/8:followed by hematology oncology holding chemo for now and plan for outpatient follow-up with surgery/ONCO.  No history of  Hypokalemia: K low this morning we will continue to replete Recent Labs  Lab 04/01/21 1430 04/02/21 0530 04/03/21 0501 04/04/21 0340 04/05/21 0416  K 3.6 3.1* 3.1* 3.5 3.2*    Acute renal failure superimposed on stage 3a chronic kidney disease due to hypotension and dehydration, CT imaging no hydronephrosis.  Creatinine improved at 1.4 stable.  Encourage oral hydration Recent Labs  Lab 04/01/21 1430 04/02/21 0530 04/03/21 0501 04/04/21 0340 04/05/21 0416  BUN 22 20 16 14 14   CREATININE 2.49* 1.69* 1.31* 1.44* 1.42*     Chronic combined systolic and diastolic CHF CAD Persistent A-fib Status post BiV ICD in 2018: No chest pain, no fluid overload.Admitted with volume depletion.  Home Coreg started on Lasix Entresto were held.  Will resume home Lasix.Slowly resume as blood pressure improves.  Continue statin.  Right foot ulcer followed by podiatry.  Did not infected, wound care to  follow-up  Thrombocytopenia: Likely from chemo.  Platelet in 80s to 100.  Continue to monitor Recent Labs  Lab 04/01/21 2330 04/02/21 0530 04/03/21 0501 04/04/21 0340 04/05/21 0416  PLT 103* 97* 88* 85* 82*    Class I Obesity:Patient's Body mass index is 30.46 kg/m. : Will benefit with PCP follow-up, weight loss  healthy lifestyle and outpatient sleep evaluation.  DVT  prophylaxis: SCDs Start: 04/01/21 2154.  nO Chemical prophylaxis due to positive stool thrombocytopenia Code Status:   Code Status: DNR Family Communication: plan of care discussed with patient/WIFE at bedside.  Disposition: Currently NOT medically stable for discharge. Status is: Inpatient Remains inpatient appropriate because: Ongoing management of GI issues/persistent leukocytosis Planning for home with home health PT on discharge.  Likely discharge tomorrow  Objective: Vitals last 24 hrs: Vitals:   04/04/21 1610 04/04/21 1723 04/04/21 2024 04/05/21 0418  BP:  (!) 112/93 110/83 129/84  Pulse:  (!) 106 (!) 102 (!) 109  Resp:  20 18 18   Temp: (!) 97.5 F (36.4 C) 98.6 F (37 C) (!) 97.2 F (36.2 C) 99.3 F (37.4 C)  TempSrc: Oral Oral Oral Oral  SpO2:  99% 97% 100%  Weight:      Height:       Weight change:   Physical Examination: General exam: AA0X3, pleasant, older than stated age, weak appearing. HEENT:Oral mucosa moist, Ear/Nose WNL grossly, dentition normal. Respiratory system: bilaterally diminished, no use of accessory muscle Cardiovascular system: S1 & S2 +, No JVD,. Gastrointestinal system: Abdomen soft,NT,ND,BS+ Nervous System:Alert, awake, moving extremities and grossly nonfocal Extremities: LE ankle edema NONE, distal peripheral pulses palpable.  Skin: No rashes,no icterus. MSK: Normal muscle bulk,tone, power   Medications reviewed:  Scheduled Meds:  atorvastatin  40 mg Oral Daily   Chlorhexidine Gluconate Cloth  6 each Topical QHS   insulin aspart  0-9 Units Subcutaneous Q4H   magic mouthwash w/lidocaine  5 mL Oral TID AC   mouth rinse  15 mL Mouth Rinse BID   pantoprazole (PROTONIX) IV  40 mg Intravenous Q12H   potassium chloride  40 mEq Oral Q3H   sodium chloride flush  10-40 mL Intracatheter Q12H   Continuous Infusions:  sodium chloride Stopped (04/02/21 1812)      Diet Order             DIET DYS 3 Room service appropriate? Yes; Fluid  consistency: Thin  Diet effective now                   Intake/Output Summary (Last 24 hours) at 04/05/2021 1111 Last data filed at 04/04/2021 2105 Gross per 24 hour  Intake 120 ml  Output --  Net 120 ml    Net IO Since Admission: 1,239.35 mL [04/05/21 1111]  Wt Readings from Last 3 Encounters:  04/01/21 85.6 kg  03/22/21 87.6 kg  03/13/21 90.2 kg     Unresulted Labs (From admission, onward)     Start     Ordered   04/05/21 1779  Basic metabolic panel  Daily,   R     Question:  Specimen collection method  Answer:  Unit=Unit collect   04/04/21 0731   04/05/21 0500  CBC  Daily,   R     Question:  Specimen collection method  Answer:  Unit=Unit collect   04/04/21 0731          Data Reviewed: I have personally reviewed following labs and imaging studies CBC: Recent Labs  Lab 04/01/21 1430 04/01/21  2330 04/02/21 0530 04/03/21 0501 04/04/21 0340 04/05/21 0416  WBC 24.0* 19.3* 15.7* 17.8* 16.0* 16.3*  NEUTROABS 20.6*  --  13.5* 15.7*  --   --   HGB 12.5* 11.4* 11.3* 10.6* 11.0* 10.8*  HCT 38.5* 35.8* 34.8* 32.8* 34.3* 33.0*  MCV 95.1 96.5 95.9 96.2 97.2 96.2  PLT 114* 103* 97* 88* 85* 82*    Basic Metabolic Panel: Recent Labs  Lab 04/01/21 1430 04/01/21 1918 04/02/21 0530 04/03/21 0501 04/04/21 0340 04/05/21 0416  NA 134*  --  136 136 138 137  K 3.6  --  3.1* 3.1* 3.5 3.2*  CL 100  --  104 106 109 108  CO2 22  --  23 21* 20* 21*  GLUCOSE 133*  --  94 97 107* 110*  BUN 22  --  20 16 14 14   CREATININE 2.49*  --  1.69* 1.31* 1.44* 1.42*  CALCIUM 9.1  --  8.5* 8.2* 8.4* 8.3*  MG 1.8  --  1.7 1.6*  --   --   PHOS  --  4.0 4.4  --   --   --     GFR: Estimated Creatinine Clearance: 48.2 mL/min (A) (by C-G formula based on SCr of 1.42 mg/dL (H)). Liver Function Tests: Recent Labs  Lab 04/01/21 1430 04/01/21 1918 04/02/21 0530  AST 46* 38 34  ALT 28 26 25   ALKPHOS 65 62 54  BILITOT 0.6 0.6 0.8  PROT 7.4 6.5 6.0*  ALBUMIN 4.2 3.8 3.4*    Recent  Labs  Lab 04/01/21 1430  LIPASE 66*    No results for input(s): AMMONIA in the last 168 hours. Coagulation Profile: No results for input(s): INR, PROTIME in the last 168 hours. Cardiac Enzymes: Recent Labs  Lab 04/01/21 1918  CKTOTAL 163    BNP (last 3 results) No results for input(s): PROBNP in the last 8760 hours. HbA1C: No results for input(s): HGBA1C in the last 72 hours.  CBG: Recent Labs  Lab 04/04/21 1603 04/04/21 2028 04/05/21 0012 04/05/21 0413 04/05/21 0800  GLUCAP 139* 105* 120* 106* 108*    Lipid Profile: No results for input(s): CHOL, HDL, LDLCALC, TRIG, CHOLHDL, LDLDIRECT in the last 72 hours. Thyroid Function Tests: No results for input(s): TSH, T4TOTAL, FREET4, T3FREE, THYROIDAB in the last 72 hours.  Anemia Panel: No results for input(s): VITAMINB12, FOLATE, FERRITIN, TIBC, IRON, RETICCTPCT in the last 72 hours. Sepsis Labs: Recent Labs  Lab 04/01/21 1746 04/01/21 1918 04/01/21 2336 04/02/21 0031 04/02/21 0530  PROCALCITON  --  0.63  --   --   --   LATICACIDVEN 7.6*  --  1.6 1.3 1.1     Recent Results (from the past 240 hour(s))  Culture, blood (routine x 2)     Status: None (Preliminary result)   Collection Time: 04/01/21  2:30 PM   Specimen: BLOOD  Result Value Ref Range Status   Specimen Description   Final    BLOOD PORTA CATH Performed at [facility], [place] [address], [place], [place] [address]    Special Requests   Final    BOTTLES DRAWN AEROBIC AND ANAEROBIC Blood Culture adequate volume Performed at [place] [address], [place], [place] [address]    Culture   Final    NO GROWTH 4 DAYS Performed at [facility], [place] [address] [place], [place] [address]    Report Status PENDING  Incomplete  Culture, blood (routine x 2)  Status: None (Preliminary result)   Collection Time: 04/01/21  3:17 PM   Specimen: BLOOD  Result Value Ref Range Status   Specimen Description    Final    BLOOD PORTA CATH Performed at [place] [address] [place], [place] [address]    Special Requests   Final    BOTTLES DRAWN AEROBIC AND ANAEROBIC Blood Culture adequate volume Performed at [place] [address] [place], [place] [address]    Culture   Final    NO GROWTH 4 DAYS Performed at [facility], [place] [address], [place], [place] [address]    Report Status PENDING  Incomplete  C Difficile Quick Screen w PCR reflex     Status: None   Collection Time: 04/01/21  4:12 PM   Specimen: STOOL  Result Value Ref Range Status   C Diff antigen NEGATIVE NEGATIVE Final   C Diff toxin NEGATIVE NEGATIVE Final   C Diff interpretation No C. difficile detected.  Final    Comment: Performed at [facility], [place] [address] [place], [place] [address]  Gastrointestinal Panel by PCR , Stool     Status: None   Collection Time: 04/01/21  4:12 PM   Specimen: STOOL  Result Value Ref Range Status   Campylobacter species NOT DETECTED NOT DETECTED Final   Plesimonas shigelloides NOT DETECTED NOT DETECTED Final   Salmonella species NOT DETECTED NOT DETECTED Final   Yersinia enterocolitica NOT DETECTED NOT DETECTED Final   Vibrio species NOT DETECTED NOT DETECTED Final   Vibrio cholerae NOT DETECTED NOT DETECTED Final   Enteroaggregative E coli (EAEC) NOT DETECTED NOT DETECTED Final   Enteropathogenic E coli (EPEC) NOT DETECTED NOT DETECTED Final   Enterotoxigenic E coli (ETEC) NOT DETECTED NOT DETECTED Final   Shiga like toxin producing E coli (STEC) NOT DETECTED NOT DETECTED Final   Shigella/Enteroinvasive E coli (EIEC) NOT DETECTED NOT DETECTED Final   Cryptosporidium NOT DETECTED NOT DETECTED Final   Cyclospora cayetanensis NOT DETECTED NOT DETECTED Final   Entamoeba histolytica NOT DETECTED NOT DETECTED Final   Giardia lamblia NOT DETECTED NOT DETECTED Final   Adenovirus F40/41 NOT DETECTED NOT DETECTED Final    Astrovirus NOT DETECTED NOT DETECTED Final   Norovirus GI/GII NOT DETECTED NOT DETECTED Final   Rotavirus A NOT DETECTED NOT DETECTED Final   Sapovirus (I, II, IV, and V) NOT DETECTED NOT DETECTED Final    Comment: Performed at [facility], [address], [place], [place] [address]  Urine Culture     Status: None   Collection Time: 04/01/21  7:35 PM   Specimen: Urine, Clean Catch  Result Value Ref Range Status   Specimen Description   Final    URINE, CLEAN CATCH Performed at [facility], [place] [address], [place], [place] [address]    Special Requests   Final    NONE Performed at [facility], [place] [address], [place], [place] [address]    Culture   Final    NO GROWTH Performed at [facility], [place] [address] [place],  [address]    Report Status 04/03/2021 FINAL  Final  MRSA Next Gen by PCR, Nasal     Status: None   Collection Time: 04/01/21  9:50 PM   Specimen: Nasal Mucosa; Nasal Swab  Result Value Ref Range Status   MRSA by PCR Next Gen NOT DETECTED NOT DETECTED Final    Comment: (  NOTE) The GeneXpert MRSA Assay (FDA approved for NASAL specimens only), is one component of a comprehensive MRSA colonization surveillance program. It is not intended to diagnose MRSA infection nor to guide or monitor treatment for MRSA infections. Test performance is not FDA approved in patients less than 56 years old. Performed at [facility], [place] [address] [place], [place] [address]   Resp Panel by RT-PCR (Flu A&B, Covid) Nasopharyngeal Swab     Status: None   Collection Time: 04/01/21 11:57 PM   Specimen: Nasopharyngeal Swab; Nasopharyngeal(NP) swabs in vial transport medium  Result Value Ref Range Status   SARS Coronavirus 2 by RT PCR NEGATIVE NEGATIVE Final    Comment: (NOTE) SARS-CoV-2 target nucleic acids are NOT DETECTED.  The SARS-CoV-2 RNA is generally detectable in upper respiratory specimens  during the acute phase of infection. The lowest concentration of SARS-CoV-2 viral copies this assay can detect is 138 copies/mL. A negative result does not preclude SARS-Cov-2 infection and should not be used as the sole basis for treatment or other patient management decisions. A negative result may occur with  improper specimen collection/handling, submission of specimen other than nasopharyngeal swab, presence of viral mutation(s) within the areas targeted by this assay, and inadequate number of viral copies(<138 copies/mL). A negative result must be combined with clinical observations, patient history, and epidemiological information. The expected result is Negative.  Fact Sheet for Patients:  EntrepreneurPulse.com.au  Fact Sheet for Healthcare Providers:  IncredibleEmployment.be  This test is no t yet approved or cleared by the [place] FDA and  has been authorized for detection and/or diagnosis of SARS-CoV-2 by FDA under an Emergency Use Authorization (EUA). This EUA will remain  in effect (meaning this test can be used) for the duration of the COVID-19 declaration under Section 564(b)(1) of the Act, 21 U.S.C.section 360bbb-3(b)(1), unless the authorization is terminated  or revoked sooner.       Influenza A by PCR NEGATIVE NEGATIVE Final   Influenza B by PCR NEGATIVE NEGATIVE Final    Comment: (NOTE) The Xpert Xpress SARS-CoV-2/FLU/RSV plus assay is intended as an aid in the diagnosis of influenza from Nasopharyngeal swab specimens and should not be used as a sole basis for treatment. Nasal washings and aspirates are unacceptable for Xpert Xpress SARS-CoV-2/FLU/RSV testing.  Fact Sheet for Patients: EntrepreneurPulse.com.au  Fact Sheet for Healthcare Providers: IncredibleEmployment.be  This test is not yet approved or cleared by the [place] FDA and has been authorized for detection  and/or diagnosis of SARS-CoV-2 by FDA under an Emergency Use Authorization (EUA). This EUA will remain in effect (meaning this test can be used) for the duration of the COVID-19 declaration under Section 564(b)(1) of the Act, 21 U.S.C. section 360bbb-3(b)(1), unless the authorization is terminated or revoked.  Performed at [facility], [place] [address] [place],  [address]      Antimicrobials: Anti-infectives (From admission, onward)    Start     Dose/Rate Route Frequency Ordered Stop   04/03/21 0900  piperacillin-tazobactam (ZOSYN) IVPB 3.375 g  Status:  Discontinued        3.375 g 12.5 mL/hr over 240 Minutes Intravenous Every 8 hours 04/03/21 0811 04/05/21 0738   04/02/21 1600  ceFEPIme (MAXIPIME) 2 g in sodium chloride 0.9 % 100 mL IVPB  Status:  Discontinued        2 g 200 mL/hr over 30 Minutes Intravenous Every 24 hours 04/01/21 1940 04/02/21 1332   04/02/21 1430  ceFEPIme (MAXIPIME) 2  g in sodium chloride 0.9 % 100 mL IVPB  Status:  Discontinued        2 g 200 mL/hr over 30 Minutes Intravenous Every 12 hours 04/02/21 1332 04/03/21 0745   04/02/21 0600  metroNIDAZOLE (FLAGYL) IVPB 500 mg  Status:  Discontinued        500 mg 100 mL/hr over 60 Minutes Intravenous Every 12 hours 04/01/21 1918 04/03/21 0811   04/01/21 1615  ceFEPIme (MAXIPIME) 2 g in sodium chloride 0.9 % 100 mL IVPB        2 g 200 mL/hr over 30 Minutes Intravenous  Once 04/01/21 1611 04/01/21 1722   04/01/21 1615  metroNIDAZOLE (FLAGYL) IVPB 500 mg        500 mg 100 mL/hr over 60 Minutes Intravenous  Once 04/01/21 1611 04/01/21 1732   04/01/21 1530  ceFEPIme (MAXIPIME) 2 g in sodium chloride 0.9 % 100 mL IVPB  Status:  Discontinued        2 g 200 mL/hr over 30 Minutes Intravenous  Once 04/01/21 1528 04/01/21 1534   04/01/21 1530  metroNIDAZOLE (FLAGYL) IVPB 500 mg  Status:  Discontinued        500 mg 100 mL/hr over 60 Minutes Intravenous  Once 04/01/21 1528 04/01/21 1534       Culture/Microbiology    Component Value Date/Time   SDES  04/01/2021 1935    URINE, CLEAN CATCH Performed at [facility], [place] [address], [place], [place] [address]    [place]  04/01/2021 1935    NONE Performed at [facility], [place] [address], [place], [place] [address]    CULT  04/01/2021 1935    NO GROWTH Performed at [place] [address], [name], [place] [address]    REPTSTATUS 04/03/2021 FINAL 04/01/2021 1935   Radiology Studies: No results found.   LOS: 4 days   Antonieta Pert, MD [facility]  04/05/2021, 11:11 AM

## 2021-04-05 NOTE — Care Management Important Message (Signed)
Important Message  Patient Details IM Letter given to the Patient Name: Jon Williams MRN: [id number] Date of Birth: Jun 25, 1950   Medicare Important Message Given:  Yes     Kerin Salen 04/05/2021, 10:28 AM

## 2021-04-05 NOTE — Plan of Care (Signed)

## 2021-04-05 NOTE — Progress Notes (Signed)
Physical Therapy Treatment Patient Details Name: Jon Williams MRN: [id number] DOB: 14-Dec-1950 Today's Date: 04/05/2021   History of Present Illness Pt admitted from home with weakness, dehydration, diarrhea, sepsis, SIRS adn with hx of CHF, DM, DJD, afib, cardiomegly, nonischemic cardiomyopathy, AICD, R TKR, LBBB, prostate CE and gastric CA with last chemo 03/22/21    PT Comments    Pt had a decline in activity tolerance today. He was only able to ambulate 10' + 5' with seated rest break. Distance limited by pt fatigue. HR 131 with walking. If pt DCs home, he will likely need a wheelchair, and potentially PTAR transport home as he has stairs to enter the home.     Recommendations for follow up therapy are one component of a multi-disciplinary discharge planning process, led by the attending physician.  Recommendations may be updated based on patient status, additional functional criteria and insurance authorization.  Follow Up Recommendations  Home health PT     Assistance Recommended at Discharge    Patient can return home with the following A little help with walking and/or transfers;A little help with bathing/dressing/bathroom;Assistance with cooking/housework;Assist for transportation;Help with stairs or ramp for entrance   Equipment Recommendations  Rolling walker (2 wheels);Wheelchair cushion (measurements PT);Wheelchair (measurements PT)    Recommendations for Other Services       Precautions / Restrictions Precautions Precautions: Fall Restrictions Weight Bearing Restrictions: No     Mobility  Bed Mobility Overal bed mobility: Modified Independent Bed Mobility: Supine to Sit     Supine to sit: Modified independent (Device/Increase time)     General bed mobility comments: used rail    Transfers Overall transfer level: Needs assistance Equipment used: Rolling walker (2 wheels) Transfers: Sit to/from Stand Sit to Stand: Min assist           General  transfer comment: assist to bring wt up and fwd and to balance in initial standing    Ambulation/Gait Ambulation/Gait assistance: Min guard Gait Distance (Feet): 15 Feet (10' + 5')   Gait Pattern/deviations: Step-to pattern, Shuffle, Trunk flexed, Wide base of support Gait velocity: decr     General Gait Details: 10' + 5' with seated rest, cues for posture and position from RW, and to roll rather than lift RW;  HR 131 max,  distance ltd by pt fatigue (he reported his LEs feel heavy)   Stairs             Wheelchair Mobility    Modified Rankin (Stroke Patients Only)       Balance Overall balance assessment: Needs assistance Sitting-balance support: Feet supported Sitting balance-Leahy Scale: Good     Standing balance support: Bilateral upper extremity supported, Reliant on assistive device for balance Standing balance-Leahy Scale: Poor                              Cognition Arousal/Alertness: Awake/alert Behavior During Therapy: WFL for tasks assessed/performed Overall Cognitive Status: Within Functional Limits for tasks assessed                                          Exercises      General Comments        Pertinent Vitals/Pain Pain Assessment Pain Score: 0-No pain    Home Living  Prior Function            PT Goals (current goals can now be found in the care plan section) Acute Rehab PT Goals Patient Stated Goal: Regain IND, be able to walk long distances, grip utensils to feed self PT Goal Formulation: With patient/family Time For Goal Achievement: 04/16/21 Potential to Achieve Goals: Good Progress towards PT goals: Progressing toward goals    Frequency    Min 3X/week      PT Plan Current plan remains appropriate    Co-evaluation              AM-PAC PT "6 Clicks" Mobility   Outcome Measure  Help needed turning from your back to your side while in a flat bed  without using bedrails?: None Help needed moving from lying on your back to sitting on the side of a flat bed without using bedrails?: A Little Help needed moving to and from a bed to a chair (including a wheelchair)?: A Little Help needed standing up from a chair using your arms (e.g., wheelchair or bedside chair)?: A Little Help needed to walk in hospital room?: A Little Help needed climbing 3-5 steps with a railing? : Total 6 Click Score: 17    End of Session Equipment Utilized During Treatment: Gait belt Activity Tolerance: Patient limited by fatigue Patient left: in chair;with call bell/phone within reach;with family/visitor present Nurse Communication: Mobility status PT Visit Diagnosis: Unsteadiness on feet (R26.81);Muscle weakness (generalized) (M62.81);Difficulty in walking, not elsewhere classified (R26.2);Pain     Time: 8867-7373 PT Time Calculation (min) (ACUTE ONLY): 15 min  Charges:  $Gait Training: 8-22 mins                    Blondell Reveal Kistler PT 04/05/2021  Acute Rehabilitation Services Pager [phone] Office [phone]

## 2021-04-06 ENCOUNTER — Ambulatory Visit: Payer: Medicare HMO

## 2021-04-06 ENCOUNTER — Other Ambulatory Visit: Payer: Self-pay

## 2021-04-06 DIAGNOSIS — E861 Hypovolemia: Secondary | ICD-10-CM | POA: Diagnosis not present

## 2021-04-06 DIAGNOSIS — D72829 Elevated white blood cell count, unspecified: Secondary | ICD-10-CM

## 2021-04-06 DIAGNOSIS — C169 Malignant neoplasm of stomach, unspecified: Secondary | ICD-10-CM

## 2021-04-06 DIAGNOSIS — C163 Malignant neoplasm of pyloric antrum: Secondary | ICD-10-CM

## 2021-04-06 DIAGNOSIS — I9589 Other hypotension: Secondary | ICD-10-CM | POA: Diagnosis not present

## 2021-04-06 LAB — CULTURE, BLOOD (ROUTINE X 2)
Culture: NO GROWTH
Culture: NO GROWTH
Special Requests: ADEQUATE
Special Requests: ADEQUATE

## 2021-04-06 LAB — GLUCOSE, CAPILLARY
Glucose-Capillary: 105 mg/dL — ABNORMAL HIGH (ref 70–99)
Glucose-Capillary: 110 mg/dL — ABNORMAL HIGH (ref 70–99)
Glucose-Capillary: 99 mg/dL (ref 70–99)

## 2021-04-06 LAB — CBC
HCT: 30.7 % — ABNORMAL LOW (ref 39.0–52.0)
Hemoglobin: 10.3 g/dL — ABNORMAL LOW (ref 13.0–17.0)
MCH: 32.4 pg (ref 26.0–34.0)
MCHC: 33.6 g/dL (ref 30.0–36.0)
MCV: 96.5 fL (ref 80.0–100.0)
Platelets: 94 10*3/uL — ABNORMAL LOW (ref 150–400)
RBC: 3.18 MIL/uL — ABNORMAL LOW (ref 4.22–5.81)
RDW: 20.9 % — ABNORMAL HIGH (ref 11.5–15.5)
WBC: 15.8 10*3/uL — ABNORMAL HIGH (ref 4.0–10.5)
nRBC: 0.3 % — ABNORMAL HIGH (ref 0.0–0.2)

## 2021-04-06 LAB — POTASSIUM: Potassium: 3.6 mmol/L (ref 3.5–5.1)

## 2021-04-06 MED ORDER — SOTALOL HCL 120 MG PO TABS
120.0000 mg | ORAL_TABLET | Freq: Two times a day (BID) | ORAL | Status: DC
  Administered 2021-04-06: 120 mg via ORAL
  Filled 2021-04-06: qty 1

## 2021-04-06 MED ORDER — CARVEDILOL 3.125 MG PO TABS
3.1250 mg | ORAL_TABLET | Freq: Two times a day (BID) | ORAL | Status: DC
  Administered 2021-04-06: 3.125 mg via ORAL
  Filled 2021-04-06: qty 1

## 2021-04-06 MED ORDER — HEPARIN SOD (PORK) LOCK FLUSH 100 UNIT/ML IV SOLN
500.0000 [IU] | INTRAVENOUS | Status: AC | PRN
  Administered 2021-04-06: 500 [IU]
  Filled 2021-04-06: qty 5

## 2021-04-06 NOTE — Progress Notes (Signed)
Pt discharging home. Awaiting PTAR. Pt had rollator delivered to bedside. AVS printed in educational teaching completed with teach back method with pt spouse and pt. No further questions at this time.

## 2021-04-06 NOTE — TOC Transition Note (Addendum)
Transition of Care [facility]) - CM/SW Discharge Note   Patient Details  Name: Jon Williams MRN: [id number] Date of Birth: 23-Mar-1950  Transition of Care [facility]) CM/SW Contact:  Trish Mage, LCSW Phone Number: 04/06/2021, 10:26 AM   Clinical Narrative:   Patient seen in follow up to request for walker.  [name] is stable for d/c, looking forward to returning home with wife.  HH with [facility] had been previously set up by my co-worker.  [name] that he feels confident of getting up the three steps to his front door, smooth sailing from there.  He confirms that he would like a conventional walker, not a rollator.  Order seen and appreciated.  Contact Danielle with [facility] who will arrange for delivery to room today. No further needs identified. TOC sign off.  Addendum: Pt now going home by PTAR, wants rollator delivered to home.  PTAR arranged. [facility] informed    Final next level of care: Home w Home Health Services Barriers to Discharge: Barriers Resolved   Patient Goals and CMS Choice Patient states their goals for this hospitalization and ongoing recovery are:: return home and eventually to resuming some coaching   Choice offered to / list presented to : Patient  Discharge Placement                       Discharge Plan and Services     Post Acute Care Choice: Home Health                    HH Arranged: PT, OT [facility] Agency: [place] Date [place]: 04/04/21 Time [place]: 1247 Representative spoke with at [place]: [place] ([place]) Interventions     Readmission Risk Interventions Readmission Risk Prevention Plan 04/04/2021  Transportation Screening Complete  Medication Review Press photographer) Complete  PCP or Specialist appointment within 3-5 days of discharge Complete  HRI or [place] Complete  SW Recovery Care/Counseling Consult Complete  [place] Not Applicable  Some recent data might be hidden

## 2021-04-06 NOTE — Progress Notes (Signed)
This nures contacted central scheduling concerning changing order for CT CAP W Contrast to CT Chest wo contrast per MD request.  Was advised to enter the new order and it will be linked to the scheduled appointment for current CT.  New order entered for CT Chest wo contrast.  No concerns at this time.

## 2021-04-06 NOTE — Discharge Planning (Addendum)
Oncology Discharge Planning Note  [facility] Address: [place], [place], [place] [address] Hours of Operation:  Nena Polio, Monday - Friday  Clinic Contact Information:  [phone]) [phone]  Oncology Care Team: Medical Oncologist:  Dr. Truitt Merle  Patient Details: Name:  Jon Williams, Jon Williams MRN:   [id number] DOB:   03-25-1950 Reason for Current Admission: Hypotension due to hypovolemia  Discharge Planning Narrative: Discharge follow-up appointments for oncology are current and available on the AVS and MyChart.   Upon discharge from the hospital, hematology/oncology's post discharge plan of care for the outpatient setting is: going to be week of March 6 th.  Pape Parson will be called within two business days after discharge to review hematology/oncology's plan of care for full understanding.    Outpatient Oncology Specific Care Only: Oncology appointment transportation needs addressed?:  not applicable Oncology medication management for symptom management addressed?:  not applicable Chemo Alert Card reviewed?:  not applicable Immunotherapy Alert Card reviewed?:  not applicable

## 2021-04-06 NOTE — Progress Notes (Addendum)
Jon Williams   DOB:[birth year]   NW#:[id number]   MVH#:[id number]  Oncology follow up   Subjective: [name] reports that he is feeling better overall this morning.  Breathing improved.  Eating and drinking better.  No diarrhea.  Reports ongoing peripheral neuropathy in his fingertips and legs.  He feels as though he is ready to go home today.  Asking for a walker to take home with him.  Objective:  Vitals:   04/05/21 1946 04/06/21 0344  BP: 111/77 104/74  Pulse: (!) 107 (!) 107  Resp: 20 20  Temp: 97.9 F (36.6 C) 98.5 F (36.9 C)  SpO2: 100% 100%    Body mass index is 30.46 kg/m.  Intake/Output Summary (Last 24 hours) at 04/06/2021 1135 Last data filed at 04/06/2021 1015 Gross per 24 hour  Intake 476 ml  Output --  Net 476 ml     Sclerae unicteric  Oropharynx clear  No peripheral adenopathy  Lungs clear -- no rales or rhonchi  Heart regular rate and rhythm  Abdomen soft, non-tender   MSK no focal spinal tenderness, no peripheral edema  Neuro nonfocal    CBG (last 3)  Recent Labs    04/05/21 2350 04/06/21 0346 04/06/21 0742  GLUCAP 105* 99 110*     Labs:   Urine Studies No results for input(s): UHGB, CRYS in the last 72 hours.  Invalid input(s): UACOL, UAPR, USPG, UPH, UTP, UGL, [place], UBIL, UNIT, UROB, [place], UEPI, UWBC, [place], [place], [place], [place], [place]  Basic Metabolic Panel: Recent Labs  Lab 04/01/21 1430 04/01/21 1918 04/02/21 0530 04/03/21 0501 04/04/21 0340 04/05/21 0416 04/06/21 0347  NA 134*  --  136 136 138 137  --   K 3.6  --  3.1* 3.1* 3.5 3.2* 3.6  CL 100  --  104 106 109 108  --   CO2 22  --  23 21* 20* 21*  --   GLUCOSE 133*  --  94 97 107* 110*  --   BUN 22  --  20 16 14 14   --   CREATININE 2.49*  --  1.69* 1.31* 1.44* 1.42*  --   CALCIUM 9.1  --  8.5* 8.2* 8.4* 8.3*  --   MG 1.8  --  1.7 1.6*  --   --   --   PHOS  --  4.0 4.4  --   --   --   --    GFR Estimated Creatinine Clearance: 48.2 mL/min (A) (by C-G formula based on SCr of  1.42 mg/dL (H)). Liver Function Tests: Recent Labs  Lab 04/01/21 1430 04/01/21 1918 04/02/21 0530  AST 46* 38 34  ALT 28 26 25   ALKPHOS 65 62 54  BILITOT 0.6 0.6 0.8  PROT 7.4 6.5 6.0*  ALBUMIN 4.2 3.8 3.4*   Recent Labs  Lab 04/01/21 1430  LIPASE 66*   No results for input(s): AMMONIA in the last 168 hours. Coagulation profile No results for input(s): INR, PROTIME in the last 168 hours.  CBC: Recent Labs  Lab 04/01/21 1430 04/01/21 2330 04/02/21 0530 04/03/21 0501 04/04/21 0340 04/05/21 0416 04/06/21 0347  WBC 24.0*   < > 15.7* 17.8* 16.0* 16.3* 15.8*  NEUTROABS 20.6*  --  13.5* 15.7*  --   --   --   HGB 12.5*   < > 11.3* 10.6* 11.0* 10.8* 10.3*  HCT 38.5*   < > 34.8* 32.8* 34.3* 33.0* 30.7*  MCV 95.1   < > 95.9 96.2 97.2  96.2 96.5  PLT 114*   < > 97* 88* 85* 82* 94*   < > = values in this interval not displayed.   Cardiac Enzymes: Recent Labs  Lab 04/01/21 1918  CKTOTAL 163   BNP: Invalid input(s): POCBNP CBG: Recent Labs  Lab 04/05/21 1735 04/05/21 1947 04/05/21 2350 04/06/21 0346 04/06/21 0742  GLUCAP 118* 136* 105* 99 110*   D-Dimer No results for input(s): DDIMER in the last 72 hours. Hgb A1c No results for input(s): HGBA1C in the last 72 hours.  Lipid Profile No results for input(s): CHOL, HDL, LDLCALC, TRIG, CHOLHDL, LDLDIRECT in the last 72 hours. Thyroid function studies No results for input(s): TSH, T4TOTAL, T3FREE, THYROIDAB in the last 72 hours.  Invalid input(s): FREET3  Anemia work up No results for input(s): VITAMINB12, FOLATE, FERRITIN, TIBC, IRON, RETICCTPCT in the last 72 hours. Microbiology Recent Results (from the past 240 hour(s))  Culture, blood (routine x 2)     Status: None   Collection Time: 04/01/21  2:30 PM   Specimen: BLOOD  Result Value Ref Range Status   Specimen Description   Final    BLOOD PORTA CATH Performed at [facility] [address] [place], [place] [address]    Special  Requests   Final    BOTTLES DRAWN AEROBIC AND ANAEROBIC Blood Culture adequate volume Performed at [facility] [address], [place], [place] [address]    Culture   Final    NO GROWTH 5 DAYS Performed at [facility], [place] [address], [place], [place] [address]    Report Status 04/06/2021 FINAL  Final  Culture, blood (routine x 2)     Status: None   Collection Time: 04/01/21  3:17 PM   Specimen: BLOOD  Result Value Ref Range Status   Specimen Description   Final    BLOOD PORTA CATH Performed at [facility] [address] [place], [place] [address]    Special Requests   Final    BOTTLES DRAWN AEROBIC AND ANAEROBIC Blood Culture adequate volume Performed at [facility] [address], [place], [place] [address]    Culture   Final    NO GROWTH 5 DAYS Performed at [facility], [place] [address] [place], [place] [address]    Report Status 04/06/2021 FINAL  Final  C Difficile Quick Screen w PCR reflex     Status: None   Collection Time: 04/01/21  4:12 PM   Specimen: STOOL  Result Value Ref Range Status   C Diff antigen NEGATIVE NEGATIVE Final   C Diff toxin NEGATIVE NEGATIVE Final   C Diff interpretation No C. difficile detected.  Final    Comment: Performed at [facility], [place] [address] [place], [place] [address]  Gastrointestinal Panel by PCR , Stool     Status: None   Collection Time: 04/01/21  4:12 PM   Specimen: STOOL  Result Value Ref Range Status   Campylobacter species NOT DETECTED NOT DETECTED Final   Plesimonas shigelloides NOT DETECTED NOT DETECTED Final   Salmonella species NOT DETECTED NOT DETECTED Final   Yersinia enterocolitica NOT DETECTED NOT DETECTED Final   Vibrio species NOT DETECTED NOT DETECTED Final   Vibrio cholerae NOT DETECTED NOT DETECTED Final   Enteroaggregative E coli (EAEC) NOT DETECTED NOT DETECTED Final   Enteropathogenic E coli (EPEC) NOT DETECTED  NOT DETECTED Final   Enterotoxigenic E coli (ETEC) NOT DETECTED NOT  DETECTED Final   Shiga like toxin producing E coli (STEC) NOT DETECTED NOT DETECTED Final   Shigella/Enteroinvasive E coli (EIEC) NOT DETECTED NOT DETECTED Final   Cryptosporidium NOT DETECTED NOT DETECTED Final   Cyclospora cayetanensis NOT DETECTED NOT DETECTED Final   Entamoeba histolytica NOT DETECTED NOT DETECTED Final   Giardia lamblia NOT DETECTED NOT DETECTED Final   Adenovirus F40/41 NOT DETECTED NOT DETECTED Final   Astrovirus NOT DETECTED NOT DETECTED Final   Norovirus GI/GII NOT DETECTED NOT DETECTED Final   Rotavirus A NOT DETECTED NOT DETECTED Final   Sapovirus (I, II, IV, and V) NOT DETECTED NOT DETECTED Final    Comment: Performed at [facility], [address] [place], [place] [address]  Urine Culture     Status: None   Collection Time: 04/01/21  7:35 PM   Specimen: Urine, Clean Catch  Result Value Ref Range Status   Specimen Description   Final    URINE, CLEAN CATCH Performed at [facility], [place] [address], [place], [place] [address]    Special Requests   Final    NONE Performed at [facility], [place] [address], [place], [place] [address]    Culture   Final    NO GROWTH Performed at [facility], [place] [address], [place], [place] [address]    Report Status 04/03/2021 FINAL  Final  MRSA Next Gen by PCR, Nasal     Status: None   Collection Time: 04/01/21  9:50 PM   Specimen: Nasal Mucosa; Nasal Swab  Result Value Ref Range Status   MRSA by PCR Next Gen NOT DETECTED NOT DETECTED Final    Comment: (NOTE) The GeneXpert MRSA Assay (FDA approved for NASAL specimens only), is one component of a comprehensive MRSA colonization surveillance program. It is not intended to diagnose MRSA infection nor to guide or monitor treatment for MRSA infections. Test performance is not FDA approved in patients less than 38 years old. Performed at [facility], [place] [address] [place], [place] [address]   Resp Panel by RT-PCR (Flu A&B, Covid) Nasopharyngeal Swab     Status: None   Collection Time: 04/01/21 11:57 PM   Specimen: Nasopharyngeal Swab; Nasopharyngeal(NP) swabs in vial transport medium  Result Value Ref Range Status   SARS Coronavirus 2 by RT PCR NEGATIVE NEGATIVE Final    Comment: (NOTE) SARS-CoV-2 target nucleic acids are NOT DETECTED.  The SARS-CoV-2 RNA is generally detectable in upper respiratory specimens during the acute phase of infection. The lowest concentration of SARS-CoV-2 viral copies this assay can detect is 138 copies/mL. A negative result does not preclude SARS-Cov-2 infection and should not be used as the sole basis for treatment or other patient management decisions. A negative result may occur with  improper specimen collection/handling, submission of specimen other than nasopharyngeal swab, presence of viral mutation(s) within the areas targeted by this assay, and inadequate number of viral copies(<138 copies/mL). A negative result must be combined with clinical observations, patient history, and epidemiological information. The expected result is Negative.  Fact Sheet for Patients:  EntrepreneurPulse.com.au  Fact Sheet for Healthcare Providers:  IncredibleEmployment.be  This test is no t yet approved or cleared by the [place] FDA and  has been authorized for detection and/or diagnosis of SARS-CoV-2 by FDA under an Emergency Use Authorization (EUA). This EUA will remain  in effect (meaning this test can be used) for the duration of the COVID-19 declaration under Section 564(b)(1)  of the Act, 21 U.S.C.section 360bbb-3(b)(1), unless the authorization is terminated  or revoked sooner.       Influenza A by PCR NEGATIVE NEGATIVE Final   Influenza B by PCR NEGATIVE NEGATIVE Final    Comment: (NOTE) The Xpert Xpress SARS-CoV-2/FLU/RSV plus assay  is intended as an aid in the diagnosis of influenza from Nasopharyngeal swab specimens and should not be used as a sole basis for treatment. Nasal washings and aspirates are unacceptable for Xpert Xpress SARS-CoV-2/FLU/RSV testing.  Fact Sheet for Patients: EntrepreneurPulse.com.au  Fact Sheet for Healthcare Providers: IncredibleEmployment.be  This test is not yet approved or cleared by the [place] FDA and has been authorized for detection and/or diagnosis of SARS-CoV-2 by FDA under an Emergency Use Authorization (EUA). This EUA will remain in effect (meaning this test can be used) for the duration of the COVID-19 declaration under Section 564(b)(1) of the Act, 21 U.S.C. section 360bbb-3(b)(1), unless the authorization is terminated or revoked.  Performed at [facility], [place] [address], [place], [place] [address]       Studies:  No results found.  Assessment: 71 y.o. male  Hypotension secondary to dehydration from diarrhea, and poor oral intake.  Resolved Leukocytosis and lactic acidosis, likely secondary to dehydration and G-CSF, no clear source of infection.  Improved, remains afebrile, cultures negative. Nausea, diarrhea, likely secondary to chemotherapy, improved AKI, secondary to dehydration and hypotension, improved with IV Chronic combined congestive heart failure with ICD, AF  Gastric cancer on neoadjuvant chemotherapy ulcer in the right foot DM  Deconditioning secondary to chemo     Plan:  -Overall, he is clinically improved.  His GI symptoms and dehydration were likely related to his chemotherapy. -No source of infection found and he is afebrile with negative cultures. -Continues to have leukocytosis but this is trending downward.  Likely related to recent G-CSF. -The patient appears stable from our standpoint to be discharged home.  Currently, he has an outpatient follow-up appointment scheduled on  04/26/2021.  However, I have sent a scheduling message to move this appointment up to the week of 04/17/2021.  He will be contacted with the date/time of his new appointment. -He will be reevaluated on 04/17/2021 to determine if he will get any additional neoadjuvant chemotherapy versus referral at that time to gastric surgery with Dr. Zenia Resides. -Communicated with primary team and TOC about the patient's request for a walker.  Mikey Bussing, NP 04/06/2021  11:35 AM  The total time spent in the visit was 30 minutes. Greater than 50%  of this time was spent counseling and coordinating care related to the above assessment and plan.

## 2021-04-06 NOTE — Progress Notes (Signed)
Physical Therapy Treatment Patient Details Name: Jon Williams MRN: [id number] DOB: [birth year] Today's Date: 04/06/2021   History of Present Illness Pt admitted from home with weakness, dehydration, diarrhea, sepsis, SIRS adn with hx of CHF, DM, DJD, afib, cardiomegly, nonischemic cardiomyopathy, AICD, R TKR, LBBB, prostate CE and gastric CA with last chemo 03/22/21    PT Comments    Pt assisted with ambulating however only able to complete short distance and requires seated rest breaks.  Pt educated to keep chairs around throughout home in case he needs rest break.  Pt to d/c home today via PTAR which remains appropriate as spouse would not be able to assist pt with steps to enter home and pt fatigues quickly with minimal activity.    Recommendations for follow up therapy are one component of a multi-disciplinary discharge planning process, led by the attending physician.  Recommendations may be updated based on patient status, additional functional criteria and insurance authorization.  Follow Up Recommendations  Home health PT     Assistance Recommended at Discharge Frequent or constant Supervision/Assistance  Patient can return home with the following A little help with walking and/or transfers;A little help with bathing/dressing/bathroom;Assistance with cooking/housework;Assist for transportation;Help with stairs or ramp for entrance   Equipment Recommendations  Rolling walker (2 wheels);Wheelchair cushion (measurements PT);Wheelchair (measurements PT)    Recommendations for Other Services       Precautions / Restrictions Precautions Precautions: Fall     Mobility  Bed Mobility Overal bed mobility: Modified Independent             General bed mobility comments: used rail    Transfers Overall transfer level: Needs assistance Equipment used: Rolling walker (2 wheels) Transfers: Sit to/from Stand Sit to Stand: Min assist           General transfer comment:  assist to steady with rise, cues for hand placement    Ambulation/Gait Ambulation/Gait assistance: Min guard Gait Distance (Feet): 8 Feet Assistive device: Rolling walker (2 wheels) Gait Pattern/deviations: Trunk flexed, Wide base of support, Step-to pattern Gait velocity: decr     General Gait Details: cues to correct forward posture and keeping RW too far forward, 8'x1 then required seated rest break, able to perform another 12'x1 then needed to sit down again   Stairs             Wheelchair Mobility    Modified Rankin (Stroke Patients Only)       Balance Overall balance assessment: Needs assistance         Standing balance support: Bilateral upper extremity supported, Reliant on assistive device for balance Standing balance-Leahy Scale: Poor                              Cognition Arousal/Alertness: Awake/alert Behavior During Therapy: WFL for tasks assessed/performed Overall Cognitive Status: Within Functional Limits for tasks assessed                                          Exercises      General Comments        Pertinent Vitals/Pain Pain Assessment Pain Assessment: Faces Faces Pain Scale: Hurts little more Pain Location: hands Pain Descriptors / Indicators: Tender, Sore Pain Intervention(s): Repositioned, Monitored during session    Home Living  Prior Function            PT Goals (current goals can now be found in the care plan section) Progress towards PT goals: Progressing toward goals    Frequency    Min 3X/week      PT Plan Current plan remains appropriate    Co-evaluation              AM-PAC PT "6 Clicks" Mobility   Outcome Measure  Help needed turning from your back to your side while in a flat bed without using bedrails?: None Help needed moving from lying on your back to sitting on the side of a flat bed without using bedrails?: A Little Help  needed moving to and from a bed to a chair (including a wheelchair)?: A Little Help needed standing up from a chair using your arms (e.g., wheelchair or bedside chair)?: A Little Help needed to walk in hospital room?: A Little Help needed climbing 3-5 steps with a railing? : Total 6 Click Score: 17    End of Session Equipment Utilized During Treatment: Gait belt Activity Tolerance: Patient limited by fatigue Patient left: in chair;with call bell/phone within reach Nurse Communication: Mobility status PT Visit Diagnosis: Muscle weakness (generalized) (M62.81);Difficulty in walking, not elsewhere classified (R26.2)     Time: 6767-2094 PT Time Calculation (min) (ACUTE ONLY): 18 min  Charges:  $Gait Training: 8-22 mins                    Jannette Spanner PT, DPT Acute Rehabilitation Services Pager: [phone] Office: [place] 04/06/2021, 12:45 PM

## 2021-04-07 ENCOUNTER — Telehealth: Payer: Self-pay | Admitting: Hematology

## 2021-04-07 ENCOUNTER — Telehealth: Payer: Self-pay | Admitting: Podiatry

## 2021-04-07 NOTE — Telephone Encounter (Signed)
Hard for me to tell whether they can do anything to help him. He is seeing cardiology in several days

## 2021-04-07 NOTE — Telephone Encounter (Signed)
.  Called patient to schedule appointment per 2/23 inbasket, patient is aware of date and time.

## 2021-04-07 NOTE — Telephone Encounter (Signed)
Patient wife called and stated that they would like the results of the vascular study because Mr. Kienle is still in severe pain.   Please Arnoldo Hooker

## 2021-04-10 ENCOUNTER — Emergency Department (HOSPITAL_COMMUNITY)
Admission: EM | Admit: 2021-04-10 | Discharge: 2021-04-10 | Disposition: A | Discharge status: Home / Self Care | Payer: Medicare HMO | Attending: Emergency Medicine | Admitting: Emergency Medicine

## 2021-04-10 ENCOUNTER — Other Ambulatory Visit: Payer: Self-pay

## 2021-04-10 ENCOUNTER — Ambulatory Visit (INDEPENDENT_AMBULATORY_CARE_PROVIDER_SITE_OTHER): Payer: Medicare HMO

## 2021-04-10 ENCOUNTER — Encounter (HOSPITAL_COMMUNITY): Payer: Self-pay

## 2021-04-10 ENCOUNTER — Emergency Department (HOSPITAL_COMMUNITY): Payer: Medicare HMO

## 2021-04-10 DIAGNOSIS — M85871 Other specified disorders of bone density and structure, right ankle and foot: Secondary | ICD-10-CM | POA: Diagnosis not present

## 2021-04-10 DIAGNOSIS — N183 Chronic kidney disease, stage 3 unspecified: Secondary | ICD-10-CM | POA: Diagnosis not present

## 2021-04-10 DIAGNOSIS — L97412 Non-pressure chronic ulcer of right heel and midfoot with fat layer exposed: Secondary | ICD-10-CM | POA: Insufficient documentation

## 2021-04-10 DIAGNOSIS — I959 Hypotension, unspecified: Secondary | ICD-10-CM | POA: Insufficient documentation

## 2021-04-10 DIAGNOSIS — E1122 Type 2 diabetes mellitus with diabetic chronic kidney disease: Secondary | ICD-10-CM | POA: Insufficient documentation

## 2021-04-10 DIAGNOSIS — S91301A Unspecified open wound, right foot, initial encounter: Secondary | ICD-10-CM | POA: Diagnosis not present

## 2021-04-10 DIAGNOSIS — I5022 Chronic systolic (congestive) heart failure: Secondary | ICD-10-CM

## 2021-04-10 DIAGNOSIS — R079 Chest pain, unspecified: Secondary | ICD-10-CM | POA: Diagnosis not present

## 2021-04-10 DIAGNOSIS — L97512 Non-pressure chronic ulcer of other part of right foot with fat layer exposed: Secondary | ICD-10-CM

## 2021-04-10 DIAGNOSIS — M2011 Hallux valgus (acquired), right foot: Secondary | ICD-10-CM | POA: Diagnosis not present

## 2021-04-10 DIAGNOSIS — I10 Essential (primary) hypertension: Secondary | ICD-10-CM | POA: Diagnosis not present

## 2021-04-10 DIAGNOSIS — M79671 Pain in right foot: Secondary | ICD-10-CM | POA: Diagnosis present

## 2021-04-10 DIAGNOSIS — I428 Other cardiomyopathies: Secondary | ICD-10-CM

## 2021-04-10 DIAGNOSIS — I2510 Atherosclerotic heart disease of native coronary artery without angina pectoris: Secondary | ICD-10-CM | POA: Insufficient documentation

## 2021-04-10 DIAGNOSIS — I509 Heart failure, unspecified: Secondary | ICD-10-CM | POA: Diagnosis not present

## 2021-04-10 DIAGNOSIS — M19071 Primary osteoarthritis, right ankle and foot: Secondary | ICD-10-CM | POA: Diagnosis not present

## 2021-04-10 LAB — CBC WITH DIFFERENTIAL/PLATELET
Abs Immature Granulocytes: 0.07 10*3/uL (ref 0.00–0.07)
Basophils Absolute: 0 10*3/uL (ref 0.0–0.1)
Basophils Relative: 0 %
Eosinophils Absolute: 0 10*3/uL (ref 0.0–0.5)
Eosinophils Relative: 0 %
HCT: 33.7 % — ABNORMAL LOW (ref 39.0–52.0)
Hemoglobin: 10.8 g/dL — ABNORMAL LOW (ref 13.0–17.0)
Immature Granulocytes: 1 %
Lymphocytes Relative: 13 %
Lymphs Abs: 1.3 10*3/uL (ref 0.7–4.0)
MCH: 31.8 pg (ref 26.0–34.0)
MCHC: 32 g/dL (ref 30.0–36.0)
MCV: 99.1 fL (ref 80.0–100.0)
Monocytes Absolute: 1 10*3/uL (ref 0.1–1.0)
Monocytes Relative: 10 %
Neutro Abs: 7.3 10*3/uL (ref 1.7–7.7)
Neutrophils Relative %: 76 %
Platelets: 172 10*3/uL (ref 150–400)
RBC: 3.4 MIL/uL — ABNORMAL LOW (ref 4.22–5.81)
RDW: 21.5 % — ABNORMAL HIGH (ref 11.5–15.5)
WBC: 9.7 10*3/uL (ref 4.0–10.5)
nRBC: 0 % (ref 0.0–0.2)

## 2021-04-10 LAB — CUP PACEART REMOTE DEVICE CHECK
Battery Remaining Longevity: 32 mo
Battery Remaining Percentage: 33 %
Battery Voltage: 2.92 V
Brady Statistic AP VP Percent: 0 %
Brady Statistic AP VS Percent: 0 %
Brady Statistic AS VP Percent: 0 %
Brady Statistic AS VS Percent: 0 %
Brady Statistic RA Percent Paced: 1 %
Brady Statistic RV Percent Paced: 40 %
Date Time Interrogation Session: 20230227020022
HighPow Impedance: 42 Ohm
HighPow Impedance: 42 Ohm
Implantable Lead Implant Date: 20180413
Implantable Lead Implant Date: 20200828
Implantable Lead Location: 753859
Implantable Lead Location: 753860
Implantable Lead Model: 7122
Implantable Pulse Generator Implant Date: 20180413
Lead Channel Impedance Value: 390 Ohm
Lead Channel Impedance Value: 400 Ohm
Lead Channel Pacing Threshold Amplitude: 0.75 V
Lead Channel Pacing Threshold Amplitude: 0.75 V
Lead Channel Pacing Threshold Pulse Width: 0.5 ms
Lead Channel Pacing Threshold Pulse Width: 0.5 ms
Lead Channel Sensing Intrinsic Amplitude: 2.2 mV
Lead Channel Sensing Intrinsic Amplitude: 5.9 mV
Lead Channel Setting Pacing Amplitude: 2 V
Lead Channel Setting Pacing Amplitude: 2.5 V
Lead Channel Setting Pacing Pulse Width: 0.5 ms
Lead Channel Setting Sensing Sensitivity: 0.5 mV
Pulse Gen Serial Number: 7398151

## 2021-04-10 LAB — COMPREHENSIVE METABOLIC PANEL
ALT: 26 U/L (ref 0–44)
AST: 41 U/L (ref 15–41)
Albumin: 3.4 g/dL — ABNORMAL LOW (ref 3.5–5.0)
Alkaline Phosphatase: 48 U/L (ref 38–126)
Anion gap: 8 (ref 5–15)
BUN: 13 mg/dL (ref 8–23)
CO2: 21 mmol/L — ABNORMAL LOW (ref 22–32)
Calcium: 8.4 mg/dL — ABNORMAL LOW (ref 8.9–10.3)
Chloride: 108 mmol/L (ref 98–111)
Creatinine, Ser: 1.11 mg/dL (ref 0.61–1.24)
GFR, Estimated: >60 mL/min (ref >60)
Glucose, Bld: 103 mg/dL — ABNORMAL HIGH (ref 70–99)
Potassium: 3.3 mmol/L — ABNORMAL LOW (ref 3.5–5.1)
Sodium: 137 mmol/L (ref 135–145)
Total Bilirubin: 0.5 mg/dL (ref 0.3–1.2)
Total Protein: 5.9 g/dL — ABNORMAL LOW (ref 6.5–8.1)

## 2021-04-10 LAB — LACTIC ACID, PLASMA: Lactic Acid, Venous: 1.8 mmol/L (ref 0.5–1.9)

## 2021-04-10 MED ORDER — BACITRACIN ZINC 500 UNIT/GM EX OINT
TOPICAL_OINTMENT | CUTANEOUS | Status: AC
  Filled 2021-04-10: qty 0.9

## 2021-04-10 MED ORDER — SODIUM CHLORIDE 0.9 % IV BOLUS
500.0000 mL | Freq: Once | INTRAVENOUS | Status: AC
  Administered 2021-04-10: 500 mL via INTRAVENOUS

## 2021-04-10 MED ORDER — BACITRACIN ZINC 500 UNIT/GM EX OINT: TOPICAL_OINTMENT | Freq: Two times a day (BID) | CUTANEOUS | Status: DC

## 2021-04-10 MED ORDER — ACETAMINOPHEN 325 MG PO TABS
650.0000 mg | ORAL_TABLET | Freq: Once | ORAL | Status: AC
  Administered 2021-04-10: 650 mg via ORAL
  Filled 2021-04-10: qty 2

## 2021-04-10 MED ORDER — DOXYCYCLINE HYCLATE 100 MG PO CAPS: 100.0000 mg | ORAL_CAPSULE | Freq: Two times a day (BID) | ORAL | 0 refills | Status: DC

## 2021-04-10 MED ORDER — DOXYCYCLINE HYCLATE 100 MG PO TABS
100.0000 mg | ORAL_TABLET | Freq: Once | ORAL | Status: AC
  Administered 2021-04-10: 100 mg via ORAL
  Filled 2021-04-10: qty 1

## 2021-04-10 MED ORDER — HEPARIN SOD (PORK) LOCK FLUSH 100 UNIT/ML IV SOLN
500.0000 [IU] | Freq: Once | INTRAVENOUS | Status: AC
  Administered 2021-04-10: 500 [IU]
  Filled 2021-04-10: qty 5

## 2021-04-10 NOTE — ED Provider Notes (Signed)
[facility] DEPT Provider Note   CSN: [id number] Arrival date & time: 04/10/21  0502     History  Chief Complaint  Patient presents with   foot wound    Jon Williams is a 71 y.o. male.  The history is provided by the patient, medical records and the spouse.  Jon Williams is a 71 y.o. male who presents to the Emergency Department complaining of foot pain.  He presents to the emergency department accompanied by his wife for evaluation of acute on chronic right foot pain.  He has experienced 1 month of right foot pain, worse at night.  Pain has significantly worsened over the last 24 hours and he presents for assessment.  He has a history of CHF, diabetes, coronary artery disease, stage III CKD, anemia, gastric cancer currently under treatment.  He was just admitted on February 18 for nausea, vomiting, diarrhea and possible sepsis and discharged home on February 23.  At the time of that hospitalization he did present with hypotension.  At time of discharge his blood pressures had improved and he reinitiated all of his home medications.  He does follow with podiatry for wound to the right foot.    Home Medications Prior to Admission medications   Medication Sig Start Date End Date Taking? Authorizing Provider  doxycycline (VIBRAMYCIN) 100 MG capsule Take 1 capsule (100 mg total) by mouth 2 (two) times daily. 04/10/21  Yes Quintella Reichert, MD  atorvastatin (LIPITOR) 40 MG tablet TAKE 1 TABLET(40 MG) BY MOUTH DAILY Patient taking differently: Take 40 mg by mouth daily. 11/04/20   [name], MD  carvedilol (COREG) 3.125 MG tablet TAKE 1 TABLET(3.125 MG) BY MOUTH TWICE DAILY Patient taking differently: Take 3.125 mg by mouth 2 (two) times daily with a meal. 11/04/20   [name], MD  cholecalciferol (VITAMIN D3) 25 MCG (1000 UT) tablet Take 1,000 Units by mouth daily with lunch.     [provider]  dexamethasone (DECADRON) 4 MG tablet  Take 2 tablets (8 mg total) by mouth daily. Take 1 tab daily starting the day after chemotherapy for 2 days. Take with food. 12/20/20   Truitt Merle, MD  docusate sodium (COLACE) 100 MG capsule Take 1 capsule (100 mg total) by mouth 2 (two) times daily as needed for mild constipation. 01/06/21   Davonna Belling, MD  furosemide (LASIX) 40 MG tablet TAKE 1 TABLET(40 MG) BY MOUTH TWICE DAILY Patient taking differently: Take 40 mg by mouth 2 (two) times daily. 03/16/21   [name], MD  lidocaine-prilocaine (EMLA) cream Apply to affected area once 12/20/20   Truitt Merle, MD  metFORMIN (GLUCOPHAGE-XR) 500 MG 24 hr tablet Take 500 mg by mouth every evening. 06/13/20   [provider]  Multiple Vitamin (MULTIVITAMIN WITH MINERALS) TABS tablet Take 1 tablet by mouth daily with lunch. One-A-Day    [provider]  Naphazoline HCl (CLEAR EYES OP) Place 1 drop into both eyes daily as needed (irritation). Patient not taking: Reported on 04/01/2021    [provider]  ondansetron (ZOFRAN) 8 MG tablet Take 1 tablet (8 mg total) by mouth 2 (two) times daily as needed for refractory nausea / vomiting. Start on day 3 after chemotherapy. 12/20/20   Truitt Merle, MD  pantoprazole (PROTONIX) 40 MG tablet Take 1 tablet (40 mg total) by mouth 2 (two) times daily. 12/02/20   Aline August, MD  polyethylene glycol (MIRALAX / GLYCOLAX) 17 g packet Take 17  g by mouth daily as needed for moderate constipation. Patient not taking: Reported on 04/01/2021 01/06/21   Davonna Belling, MD  potassium chloride (KLOR-CON) 10 MEQ tablet TAKE 1 TABLET(10 MEQ) BY MOUTH TWICE DAILY Patient taking differently: Take 10 mEq by mouth 2 (two) times daily. 12/05/20   [name], MD  prochlorperazine (COMPAZINE) 10 MG tablet Take 1 tablet (10 mg total) by mouth every 6 (six) hours as needed (Nausea or vomiting). 02/15/21   Truitt Merle, MD  sacubitril-valsartan (ENTRESTO) 24-26 MG Take 1 tablet by mouth 2 (two) times daily.  12/21/20   [name], MD  sotalol (BETAPACE) 80 MG tablet Take 1.5 tablets (120 mg total) by mouth in the morning and at bedtime. 03/01/21   [name], MD      Allergies    Aspirin, Sulfa antibiotics, and Other    Review of Systems   Review of Systems  All other systems reviewed and are negative.  Physical Exam Updated Vital Signs BP 108/75    Pulse (!) 59    Temp 97.9 F (36.6 C) (Oral)    Resp 20    Ht 5\' 6"  (1.676 m)    Wt 90.7 kg    SpO2 100%    BMI 32.28 kg/m  Physical Exam Vitals and nursing note reviewed.  Constitutional:      Appearance: He is well-developed.  HENT:     Head: Normocephalic and atraumatic.  Cardiovascular:     Rate and Rhythm: Normal rate and regular rhythm.     Heart sounds: No murmur heard. Pulmonary:     Effort: Pulmonary effort is normal. No respiratory distress.     Breath sounds: Normal breath sounds.  Abdominal:     Palpations: Abdomen is soft.     Tenderness: There is no abdominal tenderness. There is no guarding or rebound.  Musculoskeletal:     Comments: No significant soft tissue swelling to bilateral lower extremities.  Unable to palpate pedal pulses bilaterally.  He does have faint Doppler DP pulses bilaterally.  He has 1+ femoral pulses bilaterally.  He has open wounds to the right foot, images attached.  Right midfoot is warm, distal portion is cooler to touch.  Skin:    General: Skin is warm and dry.  Neurological:     Mental Status: He is alert and oriented to person, place, and time.  Psychiatric:        Behavior: Behavior normal.     ED Results / Procedures / Treatments   Labs (all labs ordered are listed, but only abnormal results are displayed) Labs Reviewed  CBC WITH DIFFERENTIAL/PLATELET - Abnormal; Notable for the following components:      Result Value   RBC 3.40 (*)    Hemoglobin 10.8 (*)    HCT 33.7 (*)    RDW 21.5 (*)    All other components within normal limits  COMPREHENSIVE METABOLIC PANEL -  Abnormal; Notable for the following components:   Potassium 3.3 (*)    CO2 21 (*)    Glucose, Bld 103 (*)    Calcium 8.4 (*)    Total Protein 5.9 (*)    Albumin 3.4 (*)    All other components within normal limits  CULTURE, BLOOD (ROUTINE X 2)  CULTURE, BLOOD (ROUTINE X 2)  LACTIC ACID, PLASMA    EKG EKG Interpretation  Date/Time:  Monday April 10 2021 05:30:06 EST Ventricular Rate:  60 PR Interval:  78 QRS Duration: 169 QT Interval:  361 QTC Calculation: 370 R Axis:   67 Text Interpretation: Ventricular-paced complexes No further rhythm analysis attempted due to paced rhythm IVCD, consider atypical LBBB Confirmed by Quintella Reichert [phone]) on 04/10/2021 6:01:06 AM  Radiology DG Chest 2 View  Result Date: 04/10/2021 CLINICAL DATA:  Pain for 1 month. EXAM: CHEST - 2 VIEW COMPARISON:  04/01/2021 FINDINGS: 0548 hours. The cardio pericardial silhouette is enlarged. The lungs are clear without focal pneumonia, edema, pneumothorax or pleural effusion. Left permanent pacemaker noted. Right Port-A-Cath remains in place. Telemetry leads overlie the chest. IMPRESSION: No active cardiopulmonary disease. Electronically Signed   By: Misty Stanley M.D.   On: 04/10/2021 05:55   DG Foot 2 Views Right  Result Date: 04/10/2021 CLINICAL DATA:  Foot pain for 1 month with wound. EXAM: RIGHT FOOT - 2 VIEW COMPARISON:  None. FINDINGS: Osteopenia, hallux valgus, and first MTP joint degeneration. No soft tissue emphysema. No opaque foreign body, fracture, or erosion. Generalized osteopenia. IMPRESSION: No opaque foreign body or bony erosion. Osteopenia and hallux valgus. Electronically Signed   By: Jorje Guild M.D.   On: 04/10/2021 05:55    Procedures Procedures    Medications Ordered in ED Medications  bacitracin 500 UNIT/GM ointment (has no administration in time range)  sodium chloride 0.9 % bolus 500 mL (500 mLs Intravenous New Bag/Given 04/10/21 0554)  acetaminophen (TYLENOL) tablet 650 mg  (650 mg Oral Given 04/10/21 0731)  doxycycline (VIBRA-TABS) tablet 100 mg (100 mg Oral Given 04/10/21 3546)    ED Course/ Medical Decision Making/ A&P                           Medical Decision Making Amount and/or Complexity of Data Reviewed Labs: ordered. Radiology: ordered.  Risk OTC drugs. Prescription drug management.   Patient with history of gastric cancer, CHF with EF 20%'s, CKD here for evaluation of 1 month of progressive foot pain.  He has a chronic appearing ulcer to the right foot.  Presenting blood pressure to the emergency department low with pressures in the 80s.  He was discharged home from the hospital on February 23 and had his antihypertensives reinitiated.  Labs are improved when compared to most recent hospitalization.  No evidence of sepsis or soft tissue infection.  His blood pressure quickly improved with IV fluids and with improvement in his blood pressure his pain also improved.  No evidence of osteomyelitis.  Suspect that patient's wound and pain are secondary to chronic poor perfusion that have worsened acutely in setting of transient hypotension.  Discussed with on-call podiatrist-recommends starting antibiotics in the event that there is a soft tissue infection developing.  Can arrange follow-up for later this week for podiatry follow-up.  Plan to discharge home with local wound care, postop shoe with close cardiology and podiatry follow-up.  Recommend that patient discontinue his carvedilol and Entresto at this time as these are likely contributing to his hypotension.  Patient will be observed in the emergency department for a few more hours to ensure he does not develop recurrent hypotension or new symptoms prior to discharge home.  Discussed the plan with patient and wife and at this time they are in agreement with plan.        Final Clinical Impression(s) / ED Diagnoses Final diagnoses:  Hypotensive episode  Ulcer of right foot with fat layer exposed ([place])     Rx / DC Orders ED Discharge Orders  Ordered    doxycycline (VIBRAMYCIN) 100 MG capsule  2 times daily        04/10/21 0720    Wheelchair        04/10/21 0736              Quintella Reichert, MD 04/10/21 [phone]

## 2021-04-10 NOTE — ED Triage Notes (Signed)
Pt has a wound on his right foot that has been there x 1 month. Pt states that the pain is getting worse.

## 2021-04-10 NOTE — Discharge Instructions (Addendum)
Please call to follow-up with cardiology.  You should receive a call from the podiatry office regarding a follow-up appointment later this week.    Start the antibiotics as prescribed.    Stop taking your Entresto and carvedilol.    Stop taking your Lasix.  You may restart the Lasix if you develop swelling in both of your legs.    Get rechecked immediately if you develop fevers, difficulty breathing, worsening pain/swelling to your right leg or new concerning symptoms.

## 2021-04-10 NOTE — Telephone Encounter (Signed)
Cardiology should review with him as they will treat

## 2021-04-10 NOTE — ED Notes (Signed)
Pt wheeled to waiting room. Pt verbalized understanding of discharge instructions.   

## 2021-04-11 ENCOUNTER — Telehealth: Payer: Self-pay | Admitting: Internal Medicine

## 2021-04-11 NOTE — Telephone Encounter (Signed)
New Message:     Patient went to [facility] ER yesterday for severe leg pain. The doctor at the ER wanted him to stop his  Entresto, Carvedilol and Furosemide.  This was suggested, because patient's blood pressure was low. She would like for like for  Dr Lovena Le too please tell him what he needs to do please.

## 2021-04-11 NOTE — Telephone Encounter (Signed)
Returned call to pt and spoke to wife (on [place]). States BP this am was 108/74 HR 66. Instructed wife that if BP is this low without medication, you certainly don't want to give anything that could potentially lower it more. Wife agrees and states that this has become more of an issue since beginning chemo. Advised to monitor BP at minimum once daily and if pt should have symptoms such as tiredness, lethargy, confusion, dizziness, to check BP and call if gets lower than current baseline. Gave precautions for slow position changes and increased fall risk. Wife understands and agrees with plan. Will forward to MD as FYI.

## 2021-04-12 ENCOUNTER — Other Ambulatory Visit: Payer: Medicare HMO

## 2021-04-12 ENCOUNTER — Ambulatory Visit: Payer: Medicare HMO

## 2021-04-12 ENCOUNTER — Telehealth (HOSPITAL_COMMUNITY): Payer: Self-pay | Admitting: *Deleted

## 2021-04-12 ENCOUNTER — Ambulatory Visit: Payer: Medicare HMO | Admitting: Hematology

## 2021-04-12 ENCOUNTER — Other Ambulatory Visit: Payer: Self-pay

## 2021-04-12 ENCOUNTER — Telehealth: Payer: Self-pay | Admitting: Podiatry

## 2021-04-12 DIAGNOSIS — C163 Malignant neoplasm of pyloric antrum: Secondary | ICD-10-CM

## 2021-04-12 DIAGNOSIS — I739 Peripheral vascular disease, unspecified: Secondary | ICD-10-CM | POA: Diagnosis not present

## 2021-04-12 DIAGNOSIS — C169 Malignant neoplasm of stomach, unspecified: Secondary | ICD-10-CM | POA: Diagnosis not present

## 2021-04-12 DIAGNOSIS — I959 Hypotension, unspecified: Secondary | ICD-10-CM | POA: Diagnosis not present

## 2021-04-12 DIAGNOSIS — L97511 Non-pressure chronic ulcer of other part of right foot limited to breakdown of skin: Secondary | ICD-10-CM | POA: Diagnosis not present

## 2021-04-12 DIAGNOSIS — I428 Other cardiomyopathies: Secondary | ICD-10-CM | POA: Diagnosis not present

## 2021-04-12 NOTE — Telephone Encounter (Signed)
Per Herbert Deaner physical therapist with [facility].Pt reporting intermittent  ? pain in right foot/ chronic neuropathy only taking tylenol/ procare walking shoe. What should he be wearing  and should he be wearing on both feet? Black foam comes out and pt trips pt. Not glued in place but appears  ?Please call  Herbert Deaner from [facility] and let him know about the walking shoes.  ?His # is [phone] ?

## 2021-04-12 NOTE — Telephone Encounter (Signed)
Jon Williams was contacted by telephone to verify understanding of discharge instructions status post their most recent discharge from the hospital on the date:  04/06/21.  Inpatient discharge AVS was re-reviewed with patient, along with cancer center appointments.  Verification of understanding for oncology specific follow-up was validated using the Teach Back method.  Patient went to the ER on 03/31/21 with severe leg pain and was advised to take tylenol.  Message sent to Dr. Burr Medico requesting something stronger. ? ?Transportation to appointments were confirmed for the patient as being self/caregiver. ? ?Jon Williams questions were addressed to their satisfaction upon completion of this post discharge follow-up call for outpatient oncology.  ?

## 2021-04-13 ENCOUNTER — Ambulatory Visit (HOSPITAL_COMMUNITY)
Admission: RE | Admit: 2021-04-13 | Discharge: 2021-04-13 | Disposition: A | Discharge status: Home / Self Care | Payer: Medicare HMO | Source: Ambulatory Visit | Attending: Hematology | Admitting: Hematology

## 2021-04-13 ENCOUNTER — Ambulatory Visit: Payer: Medicare HMO | Admitting: Hematology

## 2021-04-13 ENCOUNTER — Telehealth: Payer: Self-pay

## 2021-04-13 ENCOUNTER — Ambulatory Visit: Payer: Medicare HMO

## 2021-04-13 ENCOUNTER — Other Ambulatory Visit: Payer: Self-pay

## 2021-04-13 DIAGNOSIS — I700 Atherosclerosis of aorta: Secondary | ICD-10-CM | POA: Diagnosis not present

## 2021-04-13 DIAGNOSIS — C163 Malignant neoplasm of pyloric antrum: Secondary | ICD-10-CM | POA: Diagnosis not present

## 2021-04-13 DIAGNOSIS — R911 Solitary pulmonary nodule: Secondary | ICD-10-CM | POA: Diagnosis not present

## 2021-04-13 NOTE — Telephone Encounter (Signed)
Called pt and spouse regarding missed appt today w/Dr Burr Medico regarding pain in his legs.  Pt said he doesn't need to be seen by Dr. Burr Medico for his pain.  Pt and spouse stated he's has a follow-up appt with Dr. Burr Medico on 04/19/2021.  Dr. Burr Medico made aware of pt's statement. ?

## 2021-04-14 ENCOUNTER — Ambulatory Visit: Payer: Medicare HMO | Admitting: Podiatry

## 2021-04-14 ENCOUNTER — Telehealth: Payer: Self-pay | Admitting: Hematology

## 2021-04-14 NOTE — Telephone Encounter (Signed)
Please advise 

## 2021-04-14 NOTE — Telephone Encounter (Signed)
.  Called patient to schedule appointment per 3/2 inbasket, patient is aware of date and time.  ? ?

## 2021-04-14 NOTE — Progress Notes (Signed)
Remote ICD transmission.   

## 2021-04-15 LAB — CULTURE, BLOOD (ROUTINE X 2)
Culture: NO GROWTH
Culture: NO GROWTH
Special Requests: ADEQUATE

## 2021-04-17 ENCOUNTER — Encounter: Payer: Self-pay | Admitting: Podiatry

## 2021-04-17 ENCOUNTER — Other Ambulatory Visit: Payer: Self-pay

## 2021-04-17 ENCOUNTER — Ambulatory Visit: Payer: Medicare HMO | Admitting: Podiatry

## 2021-04-17 DIAGNOSIS — E119 Type 2 diabetes mellitus without complications: Secondary | ICD-10-CM | POA: Diagnosis not present

## 2021-04-17 DIAGNOSIS — D689 Coagulation defect, unspecified: Secondary | ICD-10-CM

## 2021-04-17 DIAGNOSIS — I739 Peripheral vascular disease, unspecified: Secondary | ICD-10-CM | POA: Diagnosis not present

## 2021-04-17 DIAGNOSIS — Z9189 Other specified personal risk factors, not elsewhere classified: Secondary | ICD-10-CM | POA: Diagnosis not present

## 2021-04-17 NOTE — Progress Notes (Signed)
Subjective:  ? ?Patient ID: Jon Williams, male   DOB: 71 y.o.   MRN: [id number]  ? ?HPI ?Patient presents continuation of breakdown of tissue around the right first metatarsal head.  He is also under active treatment for aggressive cancer and it does make treatment of this more difficult and has developed progressive forefoot discoloration of his right foot with pain ? ? ?ROS ? ? ?   ?Objective:  ?Physical Exam  ?No change in neurovascular status with patient having obvious distal vascular disease with breakdown of the first metatarsal head right localized no tendon bone exposure and measures about 2 x 2 cm.  He has discoloration of his forefoot and is obvious again vascular distal disease but I did not see any proximal pathology which makes his condition more difficult to treat.  Also his aggressive cancer makes treatment of this difficult ? ?   ?Assessment:  ?Difficult condition for [place] due to numerous factors with vascular disease and breakdown of the first metatarsal head right ? ?   ?Plan:  ?I am sending for vascular consult even though I am not hopeful that they will be able to do digit distal revascularization but I want to try to see if there is anything that could be done.  He appears to be in normal condition proximal but possible arteriogram might be beneficial but again his cancer is a factor and he is due to have a CT scan later this week.  We started him on a new salve for the right first metatarsal and new surgical shoe but it is remained stable and is not progressing and we are just more concerned about the discoloration of the forefoot right hand pain.  Patient also saw Dr. Earleen Newport today who concurred with diagnosis ?   ? ? ?

## 2021-04-18 MED FILL — Dexamethasone Sodium Phosphate Inj 100 MG/10ML: INTRAMUSCULAR | Qty: 1 | Status: AC

## 2021-04-19 ENCOUNTER — Telehealth: Payer: Self-pay | Admitting: Podiatry

## 2021-04-19 ENCOUNTER — Inpatient Hospital Stay: Payer: Medicare HMO | Admitting: Dietician

## 2021-04-19 ENCOUNTER — Encounter: Payer: Self-pay | Admitting: Hematology

## 2021-04-19 ENCOUNTER — Other Ambulatory Visit: Payer: Self-pay

## 2021-04-19 ENCOUNTER — Inpatient Hospital Stay: Payer: Medicare HMO | Admitting: Hematology

## 2021-04-19 ENCOUNTER — Ambulatory Visit: Payer: Medicare HMO | Admitting: Nurse Practitioner

## 2021-04-19 ENCOUNTER — Ambulatory Visit: Payer: Medicare HMO

## 2021-04-19 ENCOUNTER — Other Ambulatory Visit: Payer: Medicare HMO

## 2021-04-19 ENCOUNTER — Inpatient Hospital Stay: Payer: Medicare HMO | Attending: Hematology

## 2021-04-19 VITALS — BP 101/71 | HR 65 | Resp 18

## 2021-04-19 DIAGNOSIS — C163 Malignant neoplasm of pyloric antrum: Secondary | ICD-10-CM

## 2021-04-19 DIAGNOSIS — Z95828 Presence of other vascular implants and grafts: Secondary | ICD-10-CM

## 2021-04-19 DIAGNOSIS — C61 Malignant neoplasm of prostate: Secondary | ICD-10-CM | POA: Diagnosis not present

## 2021-04-19 DIAGNOSIS — C169 Malignant neoplasm of stomach, unspecified: Secondary | ICD-10-CM | POA: Diagnosis not present

## 2021-04-19 DIAGNOSIS — Z79899 Other long term (current) drug therapy: Secondary | ICD-10-CM | POA: Diagnosis not present

## 2021-04-19 DIAGNOSIS — D509 Iron deficiency anemia, unspecified: Secondary | ICD-10-CM | POA: Diagnosis not present

## 2021-04-19 LAB — CBC WITH DIFFERENTIAL/PLATELET
Abs Immature Granulocytes: 0.02 10*3/uL (ref 0.00–0.07)
Basophils Absolute: 0 10*3/uL (ref 0.0–0.1)
Basophils Relative: 1 %
Eosinophils Absolute: 0 10*3/uL (ref 0.0–0.5)
Eosinophils Relative: 0 %
HCT: 34.8 % — ABNORMAL LOW (ref 39.0–52.0)
Hemoglobin: 11.4 g/dL — ABNORMAL LOW (ref 13.0–17.0)
Immature Granulocytes: 0 %
Lymphocytes Relative: 18 %
Lymphs Abs: 1 10*3/uL (ref 0.7–4.0)
MCH: 32.2 pg (ref 26.0–34.0)
MCHC: 32.8 g/dL (ref 30.0–36.0)
MCV: 98.3 fL (ref 80.0–100.0)
Monocytes Absolute: 0.6 10*3/uL (ref 0.1–1.0)
Monocytes Relative: 11 %
Neutro Abs: 4 10*3/uL (ref 1.7–7.7)
Neutrophils Relative %: 70 %
Platelets: 175 10*3/uL (ref 150–400)
RBC: 3.54 MIL/uL — ABNORMAL LOW (ref 4.22–5.81)
RDW: 19.1 % — ABNORMAL HIGH (ref 11.5–15.5)
WBC: 5.7 10*3/uL (ref 4.0–10.5)
nRBC: 0 % (ref 0.0–0.2)

## 2021-04-19 LAB — COMPREHENSIVE METABOLIC PANEL
ALT: 14 U/L (ref 0–44)
AST: 27 U/L (ref 15–41)
Albumin: 3.5 g/dL (ref 3.5–5.0)
Alkaline Phosphatase: 43 U/L (ref 38–126)
Anion gap: 7 (ref 5–15)
BUN: 11 mg/dL (ref 8–23)
CO2: 22 mmol/L (ref 22–32)
Calcium: 8.7 mg/dL — ABNORMAL LOW (ref 8.9–10.3)
Chloride: 107 mmol/L (ref 98–111)
Creatinine, Ser: 0.92 mg/dL (ref 0.61–1.24)
GFR, Estimated: >60 mL/min (ref >60)
Glucose, Bld: 90 mg/dL (ref 70–99)
Potassium: 4 mmol/L (ref 3.5–5.1)
Sodium: 136 mmol/L (ref 135–145)
Total Bilirubin: 0.7 mg/dL (ref 0.3–1.2)
Total Protein: 5.7 g/dL — ABNORMAL LOW (ref 6.5–8.1)

## 2021-04-19 LAB — FERRITIN: Ferritin: 333 ng/mL (ref 24–336)

## 2021-04-19 MED ORDER — SODIUM CHLORIDE 0.9% FLUSH
10.0000 mL | INTRAVENOUS | Status: DC | PRN
  Administered 2021-04-19: 10 mL via INTRAVENOUS

## 2021-04-19 MED ORDER — HEPARIN SOD (PORK) LOCK FLUSH 100 UNIT/ML IV SOLN
500.0000 [IU] | Freq: Once | INTRAVENOUS | Status: AC
  Administered 2021-04-19: 500 [IU] via INTRAVENOUS

## 2021-04-19 NOTE — Progress Notes (Signed)
Nutrition Assessment ? ? ?Reason for Assessment: Provider referral  ? ? ?ASSESSMENT: 71 year old male with gastric cancer. He is receiving neoadjuvant FLOT q14d.  ? ?Past medical history includes chronic combined CHF, cardiomyopathy s/p AICD (EF 20-25%), atrial fibrillation, LBBB, pulmonary HTN, CAD, CKD stage III, prostate cancer, PVD ? ?2/27 - pt seen in ED for acute on chronic foot pain secondary to chronic ulcer ? ?Spoke with wife of patient via telephone. She is asking what patient should be eating. Wife reports decreased appetite and altered taste. She reports patient has "fell in love with progresso clam chowder and that is all he seems to want to eat." Patient is eating 3 small meals/day. Per recall, breakfast is usually cream of wheat or instant oatmeal, bowl of clam chowder for lunch, eats leftovers for dinner. Last night he ate a few bites of hibachi noodles, rice, and shrimp. Wife encourages pt to snack, but he declines. She has bought Equate supplement (210 kcal, 9 g protein). He drinks these occasionally. Wife reports patient was seen by podiatrist who has requested patient to be non-weight bearing and limit activity this week secondary to poorly healing wound. Wife is monitoring blood sugar, reports 79 this morning.  ? ?Nutrition Focused Physical Exam: unable to complete ? ? ?Medications: D3, MVI, colace, lasix, metformin, zofran, protonix, compazine, entresto, betapace ? ? ?Labs: reviewed ? ? ?Anthropometrics:  ? ?Height: 5'6" ?Weight: 200 lb (2/27) ?UBW: 200-205 ?BMI: 32.28 ? ? ?NUTRITION DIAGNOSIS: Food and nutrition related knowledge deficit related to gastric cancer and chronic comorbidities as evidenced by no prior need for nutrition related information  ? ? ?INTERVENTION:  ?Educated on strategies for increasing calories and protein (using milk to prepare oatmeal, adding cheese to foods, using supplement mixed with cocoa for hot chocolate, having snacks out and visible) - handout with tips  mailed ?Encouraged bedtime snack (peanut butter or cheese with crackers) - handout with snack ideas and warm supplement recipes mailed ?Continue drinking Equate supplements, suggested switching to Equate Plus for added calories and protein  ?Suggested trying Juven to support wound healing - coupons mailed ?Contact information provided  ? ?MONITORING, EVALUATION, GOAL: Patient will tolerate adequate calories and protein to minimize weight loss and promote healing  ? ? ?Next Visit: To be scheduled with treatment ? ? ? ? ? ? ?

## 2021-04-19 NOTE — Telephone Encounter (Signed)
LeLe with [facility] called inquiring about the patients wound. She wants to know how to care for his wound. I also informed her that a vascular consult was recommended. ? ?Please advise. ?

## 2021-04-19 NOTE — Progress Notes (Addendum)
Jon Williams   [phone] [phone]   Clinic Follow up Note   Patient Care Team: [name], MD as PCP - General (Internal Medicine) [name], [name], MD as PCP - Cardiology (Cardiology) [name], MD as PCP - Electrophysiology (Cardiology) [name], MD as Consulting Physician (General Surgery) Truitt Merle, MD as Consulting Physician (Hematology)  Date of Service:  04/19/2021  CHIEF COMPLAINT: f/u of gastric cancer  CURRENT THERAPY:  Neoadjuvant FLOT4, every 2 weeks, started 01/02/21  ASSESSMENT & PLAN:  Jon Williams is a 71 y.o. male with   1. Gastric Cancer, cT3N0M0, stage II, MMR deficient, HER2 2+/FISH-, PD-L1 20% -initially referred to ED by PCP on 11/30/20 for severe anemia hgb 7.4. He was also recently treated for H-pylori gastritis. EGD on 12/01/20 by Dr. Benson Norway showed a tumor at the incisura, which pathology confirmed was adenocarcinoma.  -CT CAP 12/01/20 showed no metastatic disease. -He met with Dr. Zenia Resides on 12/05/20. -He underwent echo on 10/26 showing reduced EF and severe LV dysfunction. Per Dr. Lovena Le, surgery is recommended only if it is curative given his moderate risk. -He proceeded to EUS on 12/16/20 under Dr. Benson Norway, staging T3 N0. -IHC returned abnormal, showing loss of expression in MLH1 and PMS2. Which predict good response to immunotherapy Keytruda  -he started FLOT4 with dose reduction on 01/02/21, increased to full dose and he overall tolerated well  -he developed worsening neuropathy and diarrhea after cycle 6, required hospitalization. We have held chemo treatment.  -CT AP on 04/01/21 while inpatient showed no acute findings and no evidence of metastatic disease. I reviewed these results today. I discussed he has had a good response to chemotherapy, although I am not able to tell if he has residual disease based on the CT scan.  -He is recovering very slowly, I do not plan to give him more chemo in the near  future. -He is scheduled to see his surgeon Dr. Zenia Resides next week, I encouraged him to follow-up with his cardiologist also. -We had a long discussion today regarding other treatment options, if surgery is not an option.  I would recommend immunotherapy Keytruda or radiation if he is not a candidate for surgery. -f/u in 3-4 weeks    2.  Right foot deformity, wound  -He developed worsening pain and tightness in the right foot, went to podiatry 02/24/21 -X-ray, per MD note, showed significant structural bunion deformity with no osteolysis or soft tissue budding  -He developed an abrasion with open skin at the right metatarsal head, and was placed on prophylactic doxycycline which he has completed  -the wound has continued discharge and delayed healing. He saw Dr. Paulla Dolly on 04/17/21   3.  CIPN, G1 -He developed mild intermittent numbness and tingling after cycle 4 FLOT, secondary to oxaliplatin and taxotere -I recommend to start B complex vitamin   4. H/o Prostate Cancer  -diagnosed with Gleason 4+4 prostate cancer in early 2019. He was treated with long-term ADT in combination with 8 weeks of IMRT.   5. Diabetes, atrial fibrillation, severe cardiomyopathy with EF 20 to 25%, status post ICD placement -Follow-up with PCP and cardiology -no signs of fluid overload thus far, continue monitoring closely   6. Iron deficient anemia -Secondary to gastric cancer -received 2 doses of IV Venofer, last 12/20/20 -hg stable      PLAN: -f/u with Dr. Zenia Resides on 04/25/21 -Lab and follow-up in 3 to 4 weeks   No problem-specific Assessment &  Plan notes found for this encounter.   SUMMARY OF ONCOLOGIC HISTORY: Oncology History Overview Note  Cancer Staging Gastric cancer [facility]) Staging form: Stomach, AJCC 8th Edition - Clinical stage from 12/01/2020: Stage IIB (cT3, cN0, cM0) - Signed by Truitt Merle, MD on 12/20/2020 Stage prefix: Initial diagnosis Total positive nodes: 0  Malignant neoplasm of prostate  ([place]) Staging form: Prostate, AJCC 8th Edition - Clinical: Stage IIC (cT2b, cN0, cM0, PSA: 6.7, Grade Group: 4) - Unsigned Prostate specific antigen (PSA) range: Less than 10 Gleason score: 8 Histologic grading system: 5 grade system    Gastric cancer ()  12/01/2020 Procedure   Upper Endoscopy, Dr. Benson Norway  Impression: - Normal esophagus. 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 Biopsied. - Normal examined duodenum.   12/01/2020 Pathology Results   FINAL MICROSCOPIC DIAGNOSIS:   A. INCISURA, BIOPSY:  - Adenocarcinoma, moderate to poorly differentiated arising in a  background of chronic gastritis with intestinal metaplasia.    12/01/2020 Imaging   CT CAP  IMPRESSION: Focal wall thickening along the posterior aspect of the gastric antrum, likely corresponding to the patient's newly diagnosed gastric cancer.   No findings suspicious for metastatic disease.   Mild multifocal pneumonia, lower lobe predominant, likely on the basis of aspiration. Trace right pleural effusion.   Fiducial markers along the prostate in this patient with known prostate cancer.   12/01/2020 Cancer Staging   Staging form: Stomach, AJCC 8th Edition - Clinical stage from 12/01/2020: Stage IIB (cT3, cN0, cM0) - Signed by Truitt Merle, MD on 12/20/2020 Stage prefix: Initial diagnosis Total positive nodes: 0    12/08/2020 Initial Diagnosis   Gastric cancer ([place])   12/16/2020 Procedure   EUS  - Wall thickening was seen in the lesser curve of the stomach and in the antrum of the stomach. The thickening appeared to be primarily within the serosa (Layer 5). T3 N0 Mx. - There was no sign of significant pathology in the entire pancreas. - There was no sign of significant pathology in the common bile duct and in the gallbladder. - There was no evidence of significant pathology in the left lobe of the liver. - Endosonographic images of the left adrenal gland were unremarkable. - The celiac trunk and  superior mesenteric artery were endosonographically normal. - The mediastinum was unremarkable endosonographically. - No specimens collected.   01/02/2021 -  Chemotherapy   Patient is on Treatment Plan : GASTROESOPHAGEAL FLOT q14d X 4 cycles     04/01/2021 Imaging   EXAM: CT ABDOMEN AND PELVIS WITHOUT CONTRAST  IMPRESSION: 1. No acute findings in the abdomen or pelvis. Specifically, no evidence for metastatic disease in the abdomen or pelvis. 2. Stable 16 mm left adrenal adenoma. 3. Small fat containing hernias in the right groin and umbilical region. 4. Bilateral pars interarticularis defects at L4 with 10 mm anterolisthesis of L4 on 5, stable. 5. Aortic Atherosclerosis (ICD10-I70.0).   04/13/2021 Imaging   EXAM: CT CHEST WITHOUT CONTRAST  IMPRESSION: 1. No evidence of metastatic disease in the chest. 2. Enlargement of the main pulmonary artery, as can be seen in pulmonary hypertension. 3. Coronary artery disease.   Aortic Atherosclerosis (ICD10-I70.0).      INTERVAL HISTORY:  Jon Williams is here for a follow up of gastric cancer. He was last seen by me on 04/03/21 while he was in the hospital. He presents to the clinic accompanied by his wife. He reports his right foot is still giving him problems. He reports pain and is  wearing a special shoe given to him by his podiatrist.  His eyes are watering. His wife reports he is not eating well.    All other systems were reviewed with the patient and are negative.  MEDICAL HISTORY:  Past Medical History:  Diagnosis Date   AICD (automatic cardioverter/defibrillator) present 05/25/2016   biv icd   Bunion, right    Chronic combined systolic and diastolic CHF (congestive heart failure) ([place])    Echo 1/18: Mild conc LVH, EF 15-20, severe diff HK, inf and inf-septal AK, Gr 3 DD, mild to mod MR, severe LAE, mod reduced RVSF, mod RAE, mild TR, PASP 50   Coronary artery disease involving native coronary artery without angina  pectoris 04/17/2016   LHC 1/18: pLCx 44, mLCx 20, mRCA 40, dRCA 20, LVEDP 23, mean RA 8, PA 42/20, PCWP 17   Diabetes mellitus without complication (HCC)    DJD (degenerative joint disease)    History of atrial fibrillation    History of atrial flutter    History of cardiomegaly 06/07/2016   Noted on CXR   History of colon polyps 06/28/2017   Noted on colonoscopy   LBBB (left bundle branch block)    NICM (nonischemic cardiomyopathy) ([place])    Echo 1/18:  Mild conc LVH, EF 15-20, severe diff HK, inf and inf-septal AK, Gr 3 DD, mild to mod MR, severe LAE, mod reduced RVSF, mod RAE, mild TR, PASP 50   OA (osteoarthritis)    knee   Other secondary pulmonary hypertension ([place]) 04/17/2016   Prostate cancer ([place]) 2019   Sigmoid diverticulosis 06/28/2017   Noted on colonoscopy    SURGICAL HISTORY: Past Surgical History:  Procedure Laterality Date   BIOPSY  12/01/2020   Procedure: BIOPSY;  Surgeon: [name], MD;  Location: [place];  Service: Endoscopy;;   BIV ICD INSERTION CRT-D N/A 05/25/2016   Procedure: BiV ICD Insertion CRT-D;  Surgeon: [name], MD;  Location: [place] [facility];  Service: Cardiovascular;  Laterality: N/A;   CARDIAC CATHETERIZATION N/A 03/02/2016   Procedure: Right/Left Heart Cath and Coronary Angiography;  Surgeon: Nelva Bush, MD;  Location: [place] [facility];  Service: Cardiovascular;  Laterality: N/A;   CARDIOVERSION N/A 07/17/2016   Procedure: Cardioversion;  Surgeon: [name], MD;  Location: [place] [facility];  Service: Cardiovascular;  Laterality: N/A;   COLONOSCOPY WITH PROPOFOL N/A 06/28/2017   Procedure: COLONOSCOPY WITH PROPOFOL;  Surgeon: [name], MD;  Location: [facility];  Service: Endoscopy;  Laterality: N/A;   colonscopy  2009   ESOPHAGOGASTRODUODENOSCOPY N/A 12/01/2020   Procedure: ESOPHAGOGASTRODUODENOSCOPY (EGD);  Surgeon: [name], MD;  Location: [place];  Service: Endoscopy;  Laterality: N/A;  IDA/guaiac  positive stools   ESOPHAGOGASTRODUODENOSCOPY (EGD) WITH PROPOFOL N/A 12/16/2020   Procedure: ESOPHAGOGASTRODUODENOSCOPY (EGD) WITH PROPOFOL;  Surgeon: [name], MD;  Location: [facility];  Service: Endoscopy;  Laterality: N/A;   GOLD SEED IMPLANT N/A 12/24/2017   Procedure: GOLD SEED IMPLANT, TRANSERINEAL;  Surgeon: Festus Aloe, MD;  Location: [facility];  Service: Urology;  Laterality: N/A;   INSERT / REPLACE / REMOVE PACEMAKER     LEAD REVISION  10/10/2018   LEAD REVISION/REPAIR N/A 10/10/2018   Procedure: LEAD REVISION/REPAIR;  Surgeon: [name], MD;  Location: [place] [facility];  Service: Cardiovascular;  Laterality: N/A;   POLYPECTOMY  06/28/2017   Procedure: POLYPECTOMY;  Surgeon: [name], MD;  Location: [facility];  Service: Endoscopy;;  ascending and descending colon polyp  PORTACATH PLACEMENT Right 12/23/2020   Procedure: INSERTION PORT-A-CATH;  Surgeon: [name], MD;  Location: [place];  Service: General;  Laterality: Right;   PROSTATE BIOPSY  02/20/2017   SPACE OAR INSTILLATION N/A 12/24/2017   Procedure: SPACE OAR INSTILLATION;  Surgeon: Festus Aloe, MD;  Location: [facility];  Service: Urology;  Laterality: N/A;   TOTAL KNEE ARTHROPLASTY Right 08/16/2017   Procedure: RIGHT TOTAL KNEE ARTHROPLASTY;  Surgeon: [name], MD;  Location: [place];  Service: Orthopedics;  Laterality: Right;   UPPER ESOPHAGEAL ENDOSCOPIC ULTRASOUND (EUS) N/A 12/16/2020   Procedure: UPPER ESOPHAGEAL ENDOSCOPIC ULTRASOUND (EUS);  Surgeon: [name], MD;  Location: [facility];  Service: Endoscopy;  Laterality: N/A;    I have reviewed the social history and family history with the patient and they are unchanged from previous note.  ALLERGIES:  is allergic to aspirin, sulfa antibiotics, and other.  MEDICATIONS:  Current Outpatient Medications  Medication Sig Dispense Refill   atorvastatin (LIPITOR) 40 MG tablet TAKE 1 TABLET(40 MG) BY MOUTH DAILY (Patient taking  differently: Take 40 mg by mouth daily.) 90 tablet 0   cholecalciferol (VITAMIN D3) 25 MCG (1000 UT) tablet Take 1,000 Units by mouth daily with lunch.      dexamethasone (DECADRON) 4 MG tablet Take 2 tablets (8 mg total) by mouth daily. Take 1 tab daily starting the day after chemotherapy for 2 days. Take with food. 30 tablet 1   docusate sodium (COLACE) 100 MG capsule Take 1 capsule (100 mg total) by mouth 2 (two) times daily as needed for mild constipation. 20 capsule 0   doxycycline (VIBRAMYCIN) 100 MG capsule Take 1 capsule (100 mg total) by mouth 2 (two) times daily. 20 capsule 0   furosemide (LASIX) 40 MG tablet TAKE 1 TABLET(40 MG) BY MOUTH TWICE DAILY (Patient taking differently: Take 40 mg by mouth 2 (two) times daily.) 180 tablet 3   lidocaine-prilocaine (EMLA) cream Apply to affected area once 30 g 3   metFORMIN (GLUCOPHAGE-XR) 500 MG 24 hr tablet Take 500 mg by mouth every evening.     Multiple Vitamin (MULTIVITAMIN WITH MINERALS) TABS tablet Take 1 tablet by mouth daily with lunch. One-A-Day     Naphazoline HCl (CLEAR EYES OP) Place 1 drop into both eyes daily as needed (irritation). (Patient not taking: Reported on 04/01/2021)     ondansetron (ZOFRAN) 8 MG tablet Take 1 tablet (8 mg total) by mouth 2 (two) times daily as needed for refractory nausea / vomiting. Start on day 3 after chemotherapy. 30 tablet 1   pantoprazole (PROTONIX) 40 MG tablet Take 1 tablet (40 mg total) by mouth 2 (two) times daily. 60 tablet 0   polyethylene glycol (MIRALAX / GLYCOLAX) 17 g packet Take 17 g by mouth daily as needed for moderate constipation. (Patient not taking: Reported on 04/01/2021) 14 each 0   potassium chloride (KLOR-CON) 10 MEQ tablet TAKE 1 TABLET(10 MEQ) BY MOUTH TWICE DAILY (Patient taking differently: Take 10 mEq by mouth 2 (two) times daily.) 180 tablet 3   prochlorperazine (COMPAZINE) 10 MG tablet Take 1 tablet (10 mg total) by mouth every 6 (six) hours as needed (Nausea or vomiting). 30  tablet 2   sacubitril-valsartan (ENTRESTO) 24-26 MG Take 1 tablet by mouth 2 (two) times daily. 180 tablet 3   sotalol (BETAPACE) 80 MG tablet Take 1.5 tablets (120 mg total) by mouth in the morning and at bedtime. 270 tablet 2   No current facility-administered medications for this visit.  PHYSICAL EXAMINATION: ECOG PERFORMANCE STATUS: 3 - Symptomatic, >50% confined to bed  Vitals:   04/19/21 1157  BP: 101/71  Pulse: 65  Resp: 18  SpO2: 100%   Wt Readings from Last 3 Encounters:  04/10/21 200 lb (90.7 kg)  04/01/21 188 lb 11.4 oz (85.6 kg)  03/22/21 193 lb 1.9 oz (87.6 kg)     GENERAL:alert, no distress and comfortable SKIN: skin color normal, no rashes or significant lesions EYES: normal, Conjunctiva are pink and non-injected, sclera clear  NEURO: alert & oriented x 3 with fluent speech  LABORATORY DATA:  I have reviewed the data as listed CBC Latest Ref Rng & Units 04/19/2021 04/10/2021 04/06/2021  WBC 4.0 - 10.5 K/uL 5.7 9.7 15.8(H)  Hemoglobin 13.0 - 17.0 g/dL 11.4(L) 10.8(L) 10.3(L)  Hematocrit 39.0 - 52.0 % 34.8(L) 33.7(L) 30.7(L)  Platelets 150 - 400 K/uL 175 172 94(L)     CMP Latest Ref Rng & Units 04/19/2021 04/10/2021 04/06/2021  Glucose 70 - 99 mg/dL 90 103(H) -  BUN 8 - 23 mg/dL 11 13 -  Creatinine 0.61 - 1.24 mg/dL 0.92 1.11 -  Sodium 135 - 145 mmol/L 136 137 -  Potassium 3.5 - 5.1 mmol/L 4.0 3.3(L) 3.6  Chloride 98 - 111 mmol/L 107 108 -  CO2 22 - 32 mmol/L 22 21(L) -  Calcium 8.9 - 10.3 mg/dL 8.7(L) 8.4(L) -  Total Protein 6.5 - 8.1 g/dL 5.7(L) 5.9(L) -  Total Bilirubin 0.3 - 1.2 mg/dL 0.7 0.5 -  Alkaline Phos 38 - 126 U/L 43 48 -  AST 15 - 41 U/L 27 41 -  ALT 0 - 44 U/L 14 26 -      RADIOGRAPHIC STUDIES: I have personally reviewed the radiological images as listed and agreed with the findings in the report. No results found.    No orders of the defined types were placed in this encounter.  All questions were answered. The patient knows to call  the clinic with any problems, questions or concerns. No barriers to learning was detected. The total time spent in the appointment was 40 minutes.     Truitt Merle, MD 04/19/2021   I, Wilburn Mylar, am acting as scribe for Truitt Merle, MD.   I have reviewed the above documentation for accuracy and completeness, and I agree with the above.

## 2021-04-20 ENCOUNTER — Telehealth: Payer: Self-pay | Admitting: Hematology

## 2021-04-20 ENCOUNTER — Ambulatory Visit: Payer: Medicare HMO

## 2021-04-20 NOTE — Telephone Encounter (Signed)
Scheduled follow-up appointment per 3/8 los. Patient is aware. ?

## 2021-04-21 ENCOUNTER — Telehealth: Payer: Self-pay | Admitting: *Deleted

## 2021-04-21 ENCOUNTER — Telehealth: Payer: Self-pay

## 2021-04-21 DIAGNOSIS — I428 Other cardiomyopathies: Secondary | ICD-10-CM | POA: Diagnosis not present

## 2021-04-21 DIAGNOSIS — I739 Peripheral vascular disease, unspecified: Secondary | ICD-10-CM | POA: Diagnosis not present

## 2021-04-21 DIAGNOSIS — C169 Malignant neoplasm of stomach, unspecified: Secondary | ICD-10-CM | POA: Diagnosis not present

## 2021-04-21 DIAGNOSIS — L97511 Non-pressure chronic ulcer of other part of right foot limited to breakdown of skin: Secondary | ICD-10-CM | POA: Diagnosis not present

## 2021-04-21 MED ORDER — TRAMADOL HCL 50 MG PO TABS
50.0000 mg | ORAL_TABLET | Freq: Two times a day (BID) | ORAL | 0 refills | Status: DC | PRN
Start: 2021-04-21 — End: 2021-09-13

## 2021-04-21 NOTE — Telephone Encounter (Signed)
This nurse left a message for Jon Williams, [facility] nurse related to pain medication for patiens lower extremity pain. Patient has been using Tylenol and it has not been effective.  Per MD she will call in Tramadol to the [facility].  No further concerns or questions noted at this time.   ?

## 2021-04-21 NOTE — Telephone Encounter (Signed)
Candis Schatz, nurse w/ [facility] is calling to request something else to help pain for patient. The tylenol is helping a little during the night but not during daytime.  ?He can only  take one step away from recliner w/o pain, can't stand very long for his PT. Please advise. ?

## 2021-04-21 NOTE — Addendum Note (Signed)
Addended by: Truitt Merle on: 04/21/2021 03:15 PM ? ? Modules accepted: Orders ? ?

## 2021-04-24 ENCOUNTER — Other Ambulatory Visit: Payer: Self-pay | Admitting: *Deleted

## 2021-04-24 DIAGNOSIS — I2510 Atherosclerotic heart disease of native coronary artery without angina pectoris: Secondary | ICD-10-CM

## 2021-04-24 MED ORDER — ATORVASTATIN CALCIUM 40 MG PO TABS: 40.0000 mg | ORAL_TABLET | Freq: Every day | ORAL | 3 refills | Status: DC

## 2021-04-25 DIAGNOSIS — I4891 Unspecified atrial fibrillation: Secondary | ICD-10-CM | POA: Diagnosis not present

## 2021-04-25 DIAGNOSIS — I509 Heart failure, unspecified: Secondary | ICD-10-CM | POA: Diagnosis not present

## 2021-04-25 DIAGNOSIS — C163 Malignant neoplasm of pyloric antrum: Secondary | ICD-10-CM | POA: Diagnosis not present

## 2021-04-25 DIAGNOSIS — R5381 Other malaise: Secondary | ICD-10-CM | POA: Diagnosis not present

## 2021-04-26 ENCOUNTER — Other Ambulatory Visit: Payer: Medicare HMO

## 2021-04-26 ENCOUNTER — Ambulatory Visit: Payer: Medicare HMO | Admitting: Hematology

## 2021-04-27 ENCOUNTER — Other Ambulatory Visit: Payer: Self-pay | Admitting: *Deleted

## 2021-05-05 ENCOUNTER — Telehealth: Payer: Self-pay | Admitting: *Deleted

## 2021-05-05 ENCOUNTER — Other Ambulatory Visit: Payer: Self-pay | Admitting: Sports Medicine

## 2021-05-05 MED ORDER — MUPIROCIN 2 % EX OINT: 1.0000 "application " | TOPICAL_OINTMENT | Freq: Every day | CUTANEOUS | 0 refills | Status: DC

## 2021-05-05 NOTE — Progress Notes (Signed)
I am not sure what Salve he was supposed to be getting but I sent Mupirocin for him to use for wound care. ?-Dr. Cannon Kettle ?

## 2021-05-05 NOTE — Telephone Encounter (Signed)
Patient is calling for status of  a cream (salve) that was discussed at last office visit and supposed to mailed to his home. Please advise. ?

## 2021-05-08 DIAGNOSIS — G8929 Other chronic pain: Secondary | ICD-10-CM | POA: Diagnosis not present

## 2021-05-08 DIAGNOSIS — N400 Enlarged prostate without lower urinary tract symptoms: Secondary | ICD-10-CM | POA: Diagnosis not present

## 2021-05-08 DIAGNOSIS — E1169 Type 2 diabetes mellitus with other specified complication: Secondary | ICD-10-CM | POA: Diagnosis not present

## 2021-05-08 DIAGNOSIS — E7800 Pure hypercholesterolemia, unspecified: Secondary | ICD-10-CM | POA: Diagnosis not present

## 2021-05-08 NOTE — Telephone Encounter (Signed)
Patient notified

## 2021-05-10 ENCOUNTER — Encounter: Payer: Self-pay | Admitting: Vascular Surgery

## 2021-05-10 ENCOUNTER — Ambulatory Visit (HOSPITAL_COMMUNITY)
Admission: RE | Admit: 2021-05-10 | Discharge: 2021-05-10 | Disposition: A | Discharge status: Home / Self Care | Payer: Medicare HMO | Source: Ambulatory Visit | Attending: Vascular Surgery | Admitting: Vascular Surgery

## 2021-05-10 ENCOUNTER — Ambulatory Visit (INDEPENDENT_AMBULATORY_CARE_PROVIDER_SITE_OTHER): Payer: Medicare HMO | Admitting: Vascular Surgery

## 2021-05-10 VITALS — BP 107/70 | HR 64 | Temp 97.5°F | Resp 20 | Ht 66.0 in | Wt 200.0 lb

## 2021-05-10 DIAGNOSIS — L97519 Non-pressure chronic ulcer of other part of right foot with unspecified severity: Secondary | ICD-10-CM

## 2021-05-10 DIAGNOSIS — E13621 Other specified diabetes mellitus with foot ulcer: Secondary | ICD-10-CM

## 2021-05-10 DIAGNOSIS — R0989 Other specified symptoms and signs involving the circulatory and respiratory systems: Secondary | ICD-10-CM | POA: Diagnosis not present

## 2021-05-10 NOTE — Progress Notes (Signed)
? ?Patient ID: Jon Williams, male   DOB: [birth year], 71 y.o.   MRN: [id number] ? ?Reason for Consult: Follow-up ?  ?Referred by [name], MD ? ?Subjective:  ?   ?HPI: ? ?Jon Williams is a 71 y.o. male without previous vascular history but with coronary artery disease and diabetes now in a wheelchair after treatment for gastric cancer.  He states that he has significant back pain and has only recently started walking with the help of home PT.  He does have a right foot wound also complains of tightness in the right foot and has been evaluated by Dr. Paulla Dolly and recently was prescribed mupirocin ointment for the foot.  He denies any fevers or chills.  This does not currently appear to be his limitation to walking.  He denies any previous tissue loss or swelling in his leg and he does have a previous knee replacement but this was many years ago. ? ?Patient is accompanied by his daughter who is frustrated by our conversation and appeared to be discretely videotaping or at least audio recording our conversation without prior discussion. ? ?Past Medical History:  ?Diagnosis Date  ? AICD (automatic cardioverter/defibrillator) present 05/25/2016  ? biv icd  ? Bunion, right   ? Chronic combined systolic and diastolic CHF (congestive heart failure) ([place])   ? Echo 1/18: Mild conc LVH, EF 15-20, severe diff HK, inf and inf-septal AK, Gr 3 DD, mild to mod MR, severe LAE, mod reduced RVSF, mod RAE, mild TR, PASP 50  ? Coronary artery disease involving native coronary artery without angina pectoris 04/17/2016  ? LHC 1/18: pLCx 30, mLCx 20, mRCA 40, dRCA 20, LVEDP 23, mean RA 8, PA 42/20, PCWP 17  ? Diabetes mellitus without complication ([place])   ? DJD (degenerative joint disease)   ? History of atrial fibrillation   ? History of atrial flutter   ? History of cardiomegaly 06/07/2016  ? Noted on CXR  ? History of colon polyps 06/28/2017  ? Noted on colonoscopy  ? LBBB (left bundle branch block)   ? NICM (nonischemic  cardiomyopathy) ([place])   ? Echo 1/18:  Mild conc LVH, EF 15-20, severe diff HK, inf and inf-septal AK, Gr 3 DD, mild to mod MR, severe LAE, mod reduced RVSF, mod RAE, mild TR, PASP 50  ? OA (osteoarthritis)   ? knee  ? Other secondary pulmonary hypertension ([place]) 04/17/2016  ? Prostate cancer ([place]) 2019  ? Sigmoid diverticulosis 06/28/2017  ? Noted on colonoscopy  ? ?Family History  ?Problem Relation Age of Onset  ? Hypertension Mother   ? Heart disease Mother   ? Diabetes Mother   ? Diabetes Father   ? Hypertension Father   ? Cancer Father   ?     lung cancer  ? Healthy Sister   ? Heart attack Brother   ? Heart disease Brother 24  ?     + tobacco  ? Healthy Brother   ? ?Past Surgical History:  ?Procedure Laterality Date  ? BIOPSY  12/01/2020  ? Procedure: BIOPSY;  Surgeon: [name], MD;  Location: [facility] ENDOSCOPY;  Service: Endoscopy;;  ? BIV ICD INSERTION CRT-D N/A 05/25/2016  ? Procedure: BiV ICD Insertion CRT-D;  Surgeon: [name], MD;  Location: [facility] CV LAB;  Service: Cardiovascular;  Laterality: N/A;  ? CARDIAC CATHETERIZATION N/A 03/02/2016  ? Procedure: Right/Left Heart Cath and Coronary Angiography;  Surgeon: Nelva Bush, MD;  Location: [facility] CV LAB;  Service: Cardiovascular;  Laterality: N/A;  ? CARDIOVERSION N/A 07/17/2016  ? Procedure: Cardioversion;  Surgeon: [name], MD;  Location: [place] CV LAB;  Service: Cardiovascular;  Laterality: N/A;  ? COLONOSCOPY WITH PROPOFOL N/A 06/28/2017  ? Procedure: COLONOSCOPY WITH PROPOFOL;  Surgeon: [name], MD;  Location: [facility] ENDOSCOPY;  Service: Endoscopy;  Laterality: N/A;  ? colonscopy  2009  ? ESOPHAGOGASTRODUODENOSCOPY N/A 12/01/2020  ? Procedure: ESOPHAGOGASTRODUODENOSCOPY (EGD);  Surgeon: [name], MD;  Location: [facility];  Service: Endoscopy;  Laterality: N/A;  IDA/guaiac positive stools  ? ESOPHAGOGASTRODUODENOSCOPY (EGD) WITH PROPOFOL N/A 12/16/2020  ? Procedure: ESOPHAGOGASTRODUODENOSCOPY (EGD) WITH PROPOFOL;   Surgeon: [name], MD;  Location: [facility] ENDOSCOPY;  Service: Endoscopy;  Laterality: N/A;  ? GOLD SEED IMPLANT N/A 12/24/2017  ? Procedure: GOLD SEED IMPLANT, TRANSERINEAL;  Surgeon: Festus Aloe, MD;  Location: [facility] ORS;  Service: Urology;  Laterality: N/A;  ? INSERT / REPLACE / REMOVE PACEMAKER    ? LEAD REVISION  10/10/2018  ? LEAD REVISION/REPAIR N/A 10/10/2018  ? Procedure: LEAD REVISION/REPAIR;  Surgeon: [name], MD;  Location: [facility] CV LAB;  Service: Cardiovascular;  Laterality: N/A;  ? POLYPECTOMY  06/28/2017  ? Procedure: POLYPECTOMY;  Surgeon: [name], MD;  Location: [facility] ENDOSCOPY;  Service: Endoscopy;;  ascending and descending colon polyp  ? PORTACATH PLACEMENT Right 12/23/2020  ? Procedure: INSERTION PORT-A-CATH;  Surgeon: [name], MD;  Location: [facility];  Service: General;  Laterality: Right;  ? PROSTATE BIOPSY  02/20/2017  ? SPACE OAR INSTILLATION N/A 12/24/2017  ? Procedure: SPACE OAR INSTILLATION;  Surgeon: Festus Aloe, MD;  Location: [facility] OR[facility];  Service: Urology;  Laterality: N/A;  ? TOTAL KNEE ARTHROPLASTY Right 08/16/2017  ? Procedure: RIGHT TOTAL KNEE ARTHROPLASTY;  Surgeon: [name], MD;  Location: [facility];  Service: Orthopedics;  Laterality: Right;  ? UPPER ESOPHAGEAL ENDOSCOPIC ULTRASOUND (EUS) N/A 12/16/2020  ? Procedure: UPPER ESOPHAGEAL ENDOSCOPIC ULTRASOUND (EUS);  Surgeon: [name], MD;  Location: [facility];  Service: Endoscopy;  Laterality: N/A;  ? ? ?Short Social History:  ?Social History  ? ?Tobacco Use  ? Smoking status: Never  ? Smokeless tobacco: Never  ?Substance Use Topics  ? Alcohol use: No  ? ? ?Allergies  ?Allergen Reactions  ? Aspirin Anaphylaxis and Hives  ? Sulfa Antibiotics Anaphylaxis and Hives  ? Other Other (See Comments)  ? ? ?Current Outpatient Medications  ?Medication Sig Dispense Refill  ? atorvastatin (LIPITOR) 40 MG tablet Take 1 tablet (40 mg total) by mouth daily. 90 tablet 3  ? cholecalciferol (VITAMIN D3) 25 MCG (1000  UT) tablet Take 1,000 Units by mouth daily with lunch.     ? dexamethasone (DECADRON) 4 MG tablet Take 2 tablets (8 mg total) by mouth daily. Take 1 tab daily starting the day after chemotherapy for 2 days. Take with food. 30 tablet 1  ? docusate sodium (COLACE) 100 MG capsule Take 1 capsule (100 mg total) by mouth 2 (two) times daily as needed for mild constipation. 20 capsule 0  ? doxycycline (VIBRAMYCIN) 100 MG capsule Take 1 capsule (100 mg total) by mouth 2 (two) times daily. 20 capsule 0  ? furosemide (LASIX) 40 MG tablet TAKE 1 TABLET(40 MG) BY MOUTH TWICE DAILY (Patient taking differently: Take 40 mg by mouth 2 (two) times daily.) 180 tablet 3  ? lidocaine-prilocaine (EMLA) cream Apply to affected area once 30 g 3  ? metFORMIN (GLUCOPHAGE-XR) 500 MG 24 hr tablet Take 500 mg by mouth every  evening.    ? Multiple Vitamin (MULTIVITAMIN WITH MINERALS) TABS tablet Take 1 tablet by mouth daily with lunch. One-A-Day    ? mupirocin ointment (BACTROBAN) 2 % Apply 1 application. topically daily. For wound care 22 g 0  ? Naphazoline HCl (CLEAR EYES OP) Place 1 drop into both eyes daily as needed (irritation).    ? ondansetron (ZOFRAN) 8 MG tablet Take 1 tablet (8 mg total) by mouth 2 (two) times daily as needed for refractory nausea / vomiting. Start on day 3 after chemotherapy. 30 tablet 1  ? pantoprazole (PROTONIX) 40 MG tablet Take 1 tablet (40 mg total) by mouth 2 (two) times daily. 60 tablet 0  ? polyethylene glycol (MIRALAX / GLYCOLAX) 17 g packet Take 17 g by mouth daily as needed for moderate constipation. 14 each 0  ? potassium chloride (KLOR-CON) 10 MEQ tablet TAKE 1 TABLET(10 MEQ) BY MOUTH TWICE DAILY (Patient taking differently: Take 10 mEq by mouth 2 (two) times daily.) 180 tablet 3  ? prochlorperazine (COMPAZINE) 10 MG tablet Take 1 tablet (10 mg total) by mouth every 6 (six) hours as needed (Nausea or vomiting). 30 tablet 2  ? sacubitril-valsartan (ENTRESTO) 24-26 MG Take 1 tablet by mouth 2 (two) times  daily. 180 tablet 3  ? sotalol (BETAPACE) 80 MG tablet Take 1.5 tablets (120 mg total) by mouth in the morning and at bedtime. 270 tablet 2  ? traMADol (ULTRAM) 50 MG tablet Take 1 tablet (50 mg total) by mouth eve

## 2021-05-11 ENCOUNTER — Other Ambulatory Visit: Payer: Self-pay

## 2021-05-12 ENCOUNTER — Inpatient Hospital Stay: Payer: Medicare HMO

## 2021-05-12 ENCOUNTER — Inpatient Hospital Stay (HOSPITAL_BASED_OUTPATIENT_CLINIC_OR_DEPARTMENT_OTHER): Payer: Medicare HMO | Admitting: Hematology

## 2021-05-12 ENCOUNTER — Encounter: Payer: Self-pay | Admitting: Hematology

## 2021-05-12 ENCOUNTER — Other Ambulatory Visit: Payer: Self-pay

## 2021-05-12 VITALS — BP 93/67 | HR 59 | Temp 97.6°F | Resp 18 | Ht 66.0 in | Wt 194.4 lb

## 2021-05-12 DIAGNOSIS — Z79899 Other long term (current) drug therapy: Secondary | ICD-10-CM | POA: Diagnosis not present

## 2021-05-12 DIAGNOSIS — C61 Malignant neoplasm of prostate: Secondary | ICD-10-CM | POA: Diagnosis not present

## 2021-05-12 DIAGNOSIS — C163 Malignant neoplasm of pyloric antrum: Secondary | ICD-10-CM | POA: Diagnosis not present

## 2021-05-12 DIAGNOSIS — D509 Iron deficiency anemia, unspecified: Secondary | ICD-10-CM | POA: Diagnosis not present

## 2021-05-12 DIAGNOSIS — C169 Malignant neoplasm of stomach, unspecified: Secondary | ICD-10-CM | POA: Diagnosis not present

## 2021-05-12 DIAGNOSIS — Z95828 Presence of other vascular implants and grafts: Secondary | ICD-10-CM

## 2021-05-12 LAB — CBC WITH DIFFERENTIAL/PLATELET
Abs Immature Granulocytes: 0 10*3/uL (ref 0.00–0.07)
Basophils Absolute: 0 10*3/uL (ref 0.0–0.1)
Basophils Relative: 1 %
Eosinophils Absolute: 0.1 10*3/uL (ref 0.0–0.5)
Eosinophils Relative: 2 %
HCT: 34.6 % — ABNORMAL LOW (ref 39.0–52.0)
Hemoglobin: 11.2 g/dL — ABNORMAL LOW (ref 13.0–17.0)
Immature Granulocytes: 0 %
Lymphocytes Relative: 24 %
Lymphs Abs: 1 10*3/uL (ref 0.7–4.0)
MCH: 33.2 pg (ref 26.0–34.0)
MCHC: 32.4 g/dL (ref 30.0–36.0)
MCV: 102.7 fL — ABNORMAL HIGH (ref 80.0–100.0)
Monocytes Absolute: 0.5 10*3/uL (ref 0.1–1.0)
Monocytes Relative: 11 %
Neutro Abs: 2.6 10*3/uL (ref 1.7–7.7)
Neutrophils Relative %: 62 %
Platelets: 164 10*3/uL (ref 150–400)
RBC: 3.37 MIL/uL — ABNORMAL LOW (ref 4.22–5.81)
RDW: 14.8 % (ref 11.5–15.5)
WBC: 4.2 10*3/uL (ref 4.0–10.5)
nRBC: 0 % (ref 0.0–0.2)

## 2021-05-12 LAB — FERRITIN: Ferritin: 81 ng/mL (ref 24–336)

## 2021-05-12 LAB — COMPREHENSIVE METABOLIC PANEL
ALT: 12 U/L (ref 0–44)
AST: 20 U/L (ref 15–41)
Albumin: 3.9 g/dL (ref 3.5–5.0)
Alkaline Phosphatase: 39 U/L (ref 38–126)
Anion gap: 7 (ref 5–15)
BUN: 12 mg/dL (ref 8–23)
CO2: 27 mmol/L (ref 22–32)
Calcium: 9 mg/dL (ref 8.9–10.3)
Chloride: 105 mmol/L (ref 98–111)
Creatinine, Ser: 0.89 mg/dL (ref 0.61–1.24)
GFR, Estimated: >60 mL/min (ref >60)
Glucose, Bld: 113 mg/dL — ABNORMAL HIGH (ref 70–99)
Potassium: 3.8 mmol/L (ref 3.5–5.1)
Sodium: 139 mmol/L (ref 135–145)
Total Bilirubin: 0.9 mg/dL (ref 0.3–1.2)
Total Protein: 6.3 g/dL — ABNORMAL LOW (ref 6.5–8.1)

## 2021-05-12 MED ORDER — GABAPENTIN 100 MG PO CAPS: 100.0000 mg | ORAL_CAPSULE | Freq: Three times a day (TID) | ORAL | 0 refills | Status: DC

## 2021-05-12 MED ORDER — HEPARIN SOD (PORK) LOCK FLUSH 100 UNIT/ML IV SOLN
500.0000 [IU] | Freq: Once | INTRAVENOUS | Status: AC
  Administered 2021-05-12: 500 [IU] via INTRAVENOUS

## 2021-05-12 MED ORDER — SODIUM CHLORIDE 0.9% FLUSH
10.0000 mL | INTRAVENOUS | Status: DC | PRN
  Administered 2021-05-12: 10 mL via INTRAVENOUS

## 2021-05-12 NOTE — Progress Notes (Signed)
DISCONTINUE ON PATHWAY REGIMEN - Gastroesophageal ? ? ?  A cycle is every 14 days: ?    Docetaxel  ?    Oxaliplatin  ?    Leucovorin  ?    Fluorouracil  ? ?**Always confirm dose/schedule in your pharmacy ordering system** ? ?REASON: Toxicities / Adverse Event ?PRIOR TREATMENT: [id number]: FLOT q14 Days x 4 Cycles (2 Months) Preoperative, FLOT q14 Days x 4 Cycles (2 Months) Postoperative ?TREATMENT RESPONSE: Partial Response (PR) ? ?START OFF PATHWAY REGIMEN - Gastroesophageal ? ? ?[id number]:Pembrolizumab 200 mg IV D1 q21 Days: ?  A cycle is every 21 days: ?    Pembrolizumab  ? ?**Always confirm dose/schedule in your pharmacy ordering system** ? ?Patient Characteristics: ?Distant Metastases (cM1/pM1) / Locally Recurrent Disease, Adenocarcinoma - Esophageal, [place], and Gastric, First Line, HER2 Negative/Unknown, PD?L1 Expression CPS < 5/Negative/Unknown, MSI-H/dMMR ?Histology: Adenocarcinoma ?Disease Classification: Gastric ?Therapeutic Status: Local Recurrence (No Additional Staging) ?Line of Therapy: First Line ?HER2 Status: Negative ?PD-L1 Expression Status: Awaiting Test Results ?Microsatellite/Mismatch Repair Status: MSI-H/dMMR ?Intent of Therapy: ?Non-Curative / Palliative Intent, Discussed with Patient ?

## 2021-05-12 NOTE — Progress Notes (Signed)
ON PATHWAY REGIMEN - Gastroesophageal ? ?No Change  Continue With Treatment as Ordered. ? ?Original Decision Date/Time: 12/20/2020 13:45 ? ? ?  A cycle is every 14 days: ?    Docetaxel  ?    Oxaliplatin  ?    Leucovorin  ?    Fluorouracil  ? ?**Always confirm dose/schedule in your pharmacy ordering system** ? ?Patient Characteristics: ?Gastric, Adenocarcinoma, Preoperative or Nonsurgical Candidate (Clinical Staging), cT3 or Higher or cN+, Surgical Candidate (Up to cT4a), MSS/pMMR or MSI Unknown ?Histology: Adenocarcinoma ?Disease Classification: Gastric ?Therapeutic Status: Preoperative or Nonsurgical Candidate (Clinical Staging) ?AJCC N Category: cN0 ?AJCC M Category: cM0 ?AJCC 8 Stage Grouping: IIB ?AJCC T Category: cT3 ?Microsatellite/Mismatch Repair Status: MSS/pMMR ?Intent of Therapy: ?Curative Intent, Discussed with Patient ?

## 2021-05-12 NOTE — Progress Notes (Signed)
?[facility]   ?[phone] [phone]   ?Clinic Follow up Note  ? ?Patient Care Team: ?[name], MD as PCP - General (Internal Medicine) ?[name], [name], MD as PCP - Cardiology (Cardiology) ?[name], MD as PCP - Electrophysiology (Cardiology) ?[name], MD as Consulting Physician (General Surgery) ?Truitt Merle, MD as Consulting Physician (Hematology) ? ?Date of Service:  05/12/2021 ? ?CHIEF COMPLAINT: f/u of gastric cancer ? ?CURRENT THERAPY:  ?Supportive Care ? ?ASSESSMENT & PLAN:  ?Jon Williams is a 71 y.o. male with  ? ?1. Gastric Cancer, cT3N0M0, stage II, MMR deficient, HER2 2+/FISH-, PD-L1 20% ?-initially referred to ED by PCP on 11/30/20 for severe anemia hgb 7.4. He was also recently treated for H-pylori gastritis. EGD on 12/01/20 by Dr. Benson Norway showed a tumor at the incisura, which pathology confirmed was adenocarcinoma.  ?-CT CAP 12/01/20 showed no metastatic disease. ?-He underwent echo on 10/26 showing reduced EF and severe LV dysfunction. Per Dr. Lovena Le, surgery is recommended only if it is curative given his moderate risk. ?-He proceeded to EUS on 12/16/20 under Dr. Benson Norway, staging T3 N0. ?-IHC returned abnormal, showing loss of expression in MLH1 and PMS2. Which predict good response to immunotherapy Keytruda  ?-he started FLOT4 with dose reduction on 01/02/21, increased to full dose and he overall tolerated well until cycle 6 ?-he developed worsening neuropathy and diarrhea after cycle 6, required hospitalization. We have held chemo treatment.  ?-CT AP on 04/01/21 while inpatient showed no acute findings and no evidence of metastatic disease. Gastric wall thickening has resolved, likely response to chemotherapy ?-He is recovering very slowly, his performance status remains to be very low, I do not plan to give him more chemo in the near future. ?-He saw Dr. Zenia Resides on 04/25/21. Per her note, Dr. Zenia Resides feels his performance status is too poor at this  time to consider surgery. She will see him back on 05/29/21 to see how he is doing then. He has not improved much in the last two weeks, so we agree he will likely not improve enough to proceed with surgery. ?-I reviewed with them that CT scan has limitations; we cannot see the tumor or its size due to the nature of the GI system. I recommend endoscopy to further evaluate the primary tumor; they are agreeable. I will send a message to Dr. Benson Norway. We again discussed treatment with immunotherapy Keytruda or radiation if he has residual disease since he is not a candidate for surgery.  ?-I discussed the potential benefit (response rate close to 50%, with percent complete response ) and side effects, which includes but not limited to, fatigue, pneumonitis, thyroid disorder, other endocrine disorder, colitis, skin rash, arthritis, and other autoimmune related disorders, he voiced good understanding and agrees to proceed.  I provided them with printed material about Keytruda. ?-we will plan to restart Keytruda in 3-4 weeks ?  ?2.  Right foot deformity, wound  ?-He developed worsening pain and tightness in the right foot, went to podiatry 02/24/21 ?[place] [place], per MD note, showed significant structural bunion deformity with no osteolysis or soft tissue budding  ?-He developed an abrasion with open skin at the right metatarsal head, and was placed on prophylactic doxycycline which he has completed  ?-the wound has continued discharge and delayed healing. He saw Dr. Paulla Dolly on 04/17/21 ?  ?3.  CIPN, G2 ?-He developed mild intermittent numbness and tingling after cycle 4 FLOT, secondary to oxaliplatin and taxotere ?-I recommend  to start B complex vitamin. I will also prescribe gabapentin  ?-He is on gabapentin and recommend B complex ?  ?4. H/o Prostate Cancer  ?-diagnosed with Gleason 4+4 prostate cancer in early 2019. He was treated with long-term ADT in combination with 8 weeks of IMRT. ?  ?5. Diabetes, atrial fibrillation, severe  cardiomyopathy with EF 20 to 25%, status post ICD placement ?-Follow-up with PCP and cardiology ?-no signs of fluid overload thus far, continue monitoring closely ?  ?6. Iron deficient anemia ?-Secondary to gastric cancer ?-received 2 doses of IV Venofer, last 12/20/20 ?-hg stable  ?  ?  ?PLAN: ?-referral back to Dr. Benson Norway for EGD to evaluate residual disease ?-I called in gabapentin for his neuropathy ?-Lab, follow-up, and start [place] in 4 weeks ? ? ?No problem-specific Assessment & Plan notes found for this encounter. ? ? ?SUMMARY OF ONCOLOGIC HISTORY: ?Oncology History Overview Note  ?Cancer Staging ?Gastric cancer ([place]) ?Staging form: Stomach, AJCC 8th Edition ?- Clinical stage from 12/01/2020: Stage IIB (cT3, cN0, cM0) - Signed by Truitt Merle, MD on 12/20/2020 ?Stage prefix: Initial diagnosis ?Total positive nodes: 0 ? ?Malignant neoplasm of prostate ([place]) ?Staging form: Prostate, AJCC 8th Edition ?- Clinical: Stage IIC (cT2b, cN0, cM0, PSA: 6.7, Grade Group: 4) - Unsigned ?Prostate specific antigen (PSA) range: Less than 10 ?Gleason score: 8 ?Histologic grading system: 5 grade system ? ?  ?Gastric cancer ([place])  ?12/01/2020 Procedure  ? Upper Endoscopy, Dr. Benson Norway ? ?Impression: ?- Normal esophagus. ?- Malignant gastric tumor at the incisura. Biopsied. ?- Normal examined duodenum. ?  ?12/01/2020 Pathology Results  ? FINAL MICROSCOPIC DIAGNOSIS:  ? ?A. INCISURA, BIOPSY:  ?- Adenocarcinoma, moderate to poorly differentiated arising in a  ?background of chronic gastritis with intestinal metaplasia.  ?  ?12/01/2020 Imaging  ? CT CAP ? ?IMPRESSION: ?Focal wall thickening along the posterior aspect of the gastric ?antrum, likely corresponding to the patient's newly diagnosed ?gastric cancer. ?  ?No findings suspicious for metastatic disease. ?  ?Mild multifocal pneumonia, lower lobe predominant, likely on the ?basis of aspiration. Trace right pleural effusion. ?  ?Fiducial markers along the prostate in this patient with  known ?prostate cancer. ?  ?12/01/2020 Cancer Staging  ? Staging form: Stomach, AJCC 8th Edition ?- Clinical stage from 12/01/2020: Stage IIB (cT3, cN0, cM0) - Signed by Truitt Merle, MD on 12/20/2020 ?Stage prefix: Initial diagnosis ?Total positive nodes: 0 ? ?  ?12/08/2020 Initial Diagnosis  ? Gastric cancer ([place]) ?  ?12/16/2020 Procedure  ? EUS ? ?- Wall thickening was seen in the lesser curve of the stomach and in the antrum of the stomach. The thickening appeared to be primarily within the serosa (Layer 5). T3 N0 Mx. ?- There was no sign of significant pathology in the entire pancreas. ?- There was no sign of significant pathology in the common bile duct and in the gallbladder. ?- There was no evidence of significant pathology in the left lobe of the liver. ?- Endosonographic images of the left adrenal gland were unremarkable. ?- The celiac trunk and superior mesenteric artery were endosonographically normal. ?- The mediastinum was unremarkable endosonographically. ?- No specimens collected. ?  ?01/02/2021 - 03/23/2021 Chemotherapy  ? Patient is on Treatment Plan : GASTROESOPHAGEAL FLOT q14d X 4 cycles  ?   ?04/01/2021 Imaging  ? EXAM: ?CT ABDOMEN AND PELVIS WITHOUT CONTRAST ? ?IMPRESSION: ?1. No acute findings in the abdomen or pelvis. Specifically, no ?evidence for metastatic disease in the abdomen or pelvis. ?2. Stable 16 mm  left adrenal adenoma. ?3. Small fat containing hernias in the right groin and umbilical ?region. ?4. Bilateral pars interarticularis defects at L4 with 10 mm ?anterolisthesis of L4 on 5, stable. ?5. Aortic Atherosclerosis (ICD10-I70.0). ?  ?04/13/2021 Imaging  ? EXAM: ?CT CHEST WITHOUT CONTRAST ? ?IMPRESSION: ?1. No evidence of metastatic disease in the chest. ?2. Enlargement of the main pulmonary artery, as can be seen in ?pulmonary hypertension. ?3. Coronary artery disease. ?  ?Aortic Atherosclerosis (ICD10-I70.0). ?  ?06/09/2021 -  Chemotherapy  ? Patient is on Treatment Plan : GASTROESOPHAGEAL  Pembrolizumab (200) q21d  ?   ? ? ? ?INTERVAL HISTORY:  ?ABDUL BEIRNE is here for a follow up of gastric cancer. He was last seen by me on 04/19/21. He presents to the clinic accompanied by his wife an

## 2021-05-15 ENCOUNTER — Other Ambulatory Visit: Payer: Medicare HMO

## 2021-05-15 ENCOUNTER — Ambulatory Visit: Payer: Medicare HMO | Admitting: Hematology

## 2021-05-16 ENCOUNTER — Other Ambulatory Visit: Payer: Self-pay | Admitting: *Deleted

## 2021-05-16 MED ORDER — ENTRESTO 24-26 MG PO TABS: 1.0000 | ORAL_TABLET | Freq: Two times a day (BID) | ORAL | 3 refills | Status: DC

## 2021-05-22 ENCOUNTER — Other Ambulatory Visit: Payer: Self-pay | Admitting: Gastroenterology

## 2021-05-22 DIAGNOSIS — C169 Malignant neoplasm of stomach, unspecified: Secondary | ICD-10-CM | POA: Diagnosis not present

## 2021-05-23 ENCOUNTER — Telehealth: Payer: Self-pay | Admitting: Hematology

## 2021-05-23 NOTE — Telephone Encounter (Signed)
Patient's wife called to reschedule upcoming appointment due to scheduling conflicts with endoscopy appointment. ?

## 2021-05-26 DIAGNOSIS — R0789 Other chest pain: Secondary | ICD-10-CM | POA: Diagnosis not present

## 2021-05-26 DIAGNOSIS — E1169 Type 2 diabetes mellitus with other specified complication: Secondary | ICD-10-CM | POA: Diagnosis not present

## 2021-05-26 DIAGNOSIS — R0600 Dyspnea, unspecified: Secondary | ICD-10-CM | POA: Diagnosis not present

## 2021-05-29 ENCOUNTER — Other Ambulatory Visit: Payer: Self-pay | Admitting: Internal Medicine

## 2021-05-29 ENCOUNTER — Telehealth: Payer: Self-pay | Admitting: Sports Medicine

## 2021-05-29 DIAGNOSIS — R0600 Dyspnea, unspecified: Secondary | ICD-10-CM

## 2021-05-29 NOTE — Telephone Encounter (Signed)
Home Health would like  if he has been discharged from our services. The patient doesn't think he has any more appts with [place]. He also needs his office notes faxed to him at  [phone]. He also needs clarification on his footwear. He has a wound.  ?

## 2021-05-31 ENCOUNTER — Other Ambulatory Visit: Payer: Self-pay

## 2021-05-31 ENCOUNTER — Telehealth: Payer: Self-pay | Admitting: Cardiovascular Disease

## 2021-05-31 ENCOUNTER — Encounter (HOSPITAL_BASED_OUTPATIENT_CLINIC_OR_DEPARTMENT_OTHER): Payer: Self-pay

## 2021-05-31 ENCOUNTER — Encounter (HOSPITAL_COMMUNITY): Payer: Self-pay

## 2021-05-31 ENCOUNTER — Emergency Department (HOSPITAL_COMMUNITY)
Admission: EM | Admit: 2021-05-31 | Discharge: 2021-05-31 | Disposition: A | Discharge status: Home / Self Care | Payer: Medicare HMO | Attending: Emergency Medicine | Admitting: Emergency Medicine

## 2021-05-31 ENCOUNTER — Emergency Department (HOSPITAL_BASED_OUTPATIENT_CLINIC_OR_DEPARTMENT_OTHER)
Admission: RE | Admit: 2021-05-31 | Discharge: 2021-05-31 | Disposition: A | Discharge status: Home / Self Care | Payer: Medicare HMO | Source: Ambulatory Visit | Attending: Internal Medicine | Admitting: Internal Medicine

## 2021-05-31 DIAGNOSIS — I5023 Acute on chronic systolic (congestive) heart failure: Secondary | ICD-10-CM | POA: Diagnosis not present

## 2021-05-31 DIAGNOSIS — Z8546 Personal history of malignant neoplasm of prostate: Secondary | ICD-10-CM | POA: Insufficient documentation

## 2021-05-31 DIAGNOSIS — Z85028 Personal history of other malignant neoplasm of stomach: Secondary | ICD-10-CM | POA: Diagnosis not present

## 2021-05-31 DIAGNOSIS — I509 Heart failure, unspecified: Secondary | ICD-10-CM | POA: Diagnosis not present

## 2021-05-31 DIAGNOSIS — R0600 Dyspnea, unspecified: Secondary | ICD-10-CM | POA: Insufficient documentation

## 2021-05-31 DIAGNOSIS — R0789 Other chest pain: Secondary | ICD-10-CM | POA: Diagnosis not present

## 2021-05-31 DIAGNOSIS — R0602 Shortness of breath: Secondary | ICD-10-CM | POA: Diagnosis not present

## 2021-05-31 DIAGNOSIS — I110 Hypertensive heart disease with heart failure: Secondary | ICD-10-CM | POA: Diagnosis not present

## 2021-05-31 LAB — COMPREHENSIVE METABOLIC PANEL
ALT: 19 U/L (ref 0–44)
AST: 24 U/L (ref 15–41)
Albumin: 3.6 g/dL (ref 3.5–5.0)
Alkaline Phosphatase: 51 U/L (ref 38–126)
Anion gap: 8 (ref 5–15)
BUN: 12 mg/dL (ref 8–23)
CO2: 21 mmol/L — ABNORMAL LOW (ref 22–32)
Calcium: 9.1 mg/dL (ref 8.9–10.3)
Chloride: 110 mmol/L (ref 98–111)
Creatinine, Ser: 0.9 mg/dL (ref 0.61–1.24)
GFR, Estimated: >60 mL/min (ref >60)
Glucose, Bld: 103 mg/dL — ABNORMAL HIGH (ref 70–99)
Potassium: 3.8 mmol/L (ref 3.5–5.1)
Sodium: 139 mmol/L (ref 135–145)
Total Bilirubin: 0.9 mg/dL (ref 0.3–1.2)
Total Protein: 6.1 g/dL — ABNORMAL LOW (ref 6.5–8.1)

## 2021-05-31 LAB — CBC WITH DIFFERENTIAL/PLATELET
Abs Immature Granulocytes: 0.01 10*3/uL (ref 0.00–0.07)
Basophils Absolute: 0 10*3/uL (ref 0.0–0.1)
Basophils Relative: 0 %
Eosinophils Absolute: 0.1 10*3/uL (ref 0.0–0.5)
Eosinophils Relative: 2 %
HCT: 36 % — ABNORMAL LOW (ref 39.0–52.0)
Hemoglobin: 11.7 g/dL — ABNORMAL LOW (ref 13.0–17.0)
Immature Granulocytes: 0 %
Lymphocytes Relative: 21 %
Lymphs Abs: 1 10*3/uL (ref 0.7–4.0)
MCH: 33.5 pg (ref 26.0–34.0)
MCHC: 32.5 g/dL (ref 30.0–36.0)
MCV: 103.2 fL — ABNORMAL HIGH (ref 80.0–100.0)
Monocytes Absolute: 0.6 10*3/uL (ref 0.1–1.0)
Monocytes Relative: 12 %
Neutro Abs: 3.3 10*3/uL (ref 1.7–7.7)
Neutrophils Relative %: 65 %
Platelets: 181 10*3/uL (ref 150–400)
RBC: 3.49 MIL/uL — ABNORMAL LOW (ref 4.22–5.81)
RDW: 13 % (ref 11.5–15.5)
WBC: 5.1 10*3/uL (ref 4.0–10.5)
nRBC: 0 % (ref 0.0–0.2)

## 2021-05-31 LAB — BRAIN NATRIURETIC PEPTIDE: B Natriuretic Peptide: 1335.8 pg/mL — ABNORMAL HIGH (ref 0.0–100.0)

## 2021-05-31 LAB — LIPASE, BLOOD: Lipase: 40 U/L (ref 11–51)

## 2021-05-31 MED ORDER — IOHEXOL 350 MG/ML SOLN
75.0000 mL | Freq: Once | INTRAVENOUS | Status: AC | PRN
  Administered 2021-05-31: 75 mL via INTRAVENOUS

## 2021-05-31 MED ORDER — FUROSEMIDE 40 MG PO TABS
40.0000 mg | ORAL_TABLET | Freq: Two times a day (BID) | ORAL | 0 refills | Status: DC
Start: 2021-05-31 — End: 2021-06-07

## 2021-05-31 MED ORDER — ALBUTEROL SULFATE HFA 108 (90 BASE) MCG/ACT IN AERS: 2.0000 | INHALATION_SPRAY | RESPIRATORY_TRACT | Status: DC | PRN

## 2021-05-31 MED ORDER — POTASSIUM CHLORIDE ER 10 MEQ PO TBCR: EXTENDED_RELEASE_TABLET | ORAL | 0 refills | Status: DC

## 2021-05-31 MED ORDER — FUROSEMIDE 10 MG/ML IJ SOLN
40.0000 mg | Freq: Once | INTRAMUSCULAR | Status: AC
  Administered 2021-05-31: 40 mg via INTRAVENOUS
  Filled 2021-05-31: qty 4

## 2021-05-31 NOTE — Telephone Encounter (Signed)
Pt called to report that he has been having increased SOB and chest discomfort that feels like a rubberband squeezing his chest and he sounds SOB on the phone and says he had the discomfort this morning... he had a CT this morning per his PCP Dr. Delfina Redwood that r/o PE but showed some coronary calcifications... his last Cath 2018 showed some CAD.... I spoke with his wife and she agreed to take him to the ED asap today.  ?

## 2021-05-31 NOTE — ED Notes (Addendum)
Patient Alert and oriented to baseline. Stable and ambulatory to baseline. Patient verbalized understanding of the discharge instructions.  Patient belongings were taken by the patient.   

## 2021-05-31 NOTE — ED Triage Notes (Signed)
Pt arrived POV from home c/o [facility] that started yesterday. Pt stated he was just sitting there tried to get some sleep last night and was unable to. Pt also endorses right sided rib cage pain.  ?

## 2021-05-31 NOTE — Telephone Encounter (Signed)
Pt c/o Shortness Of Breath: STAT if SOB developed within the last 24 hours or pt is noticeably SOB on the phone ? ?1. Are you currently SOB (can you hear that pt is SOB on the phone)? Yes  ? ?2. How long have you been experiencing SOB? Last week (patient had cancer infection/chemo) ? ?3. Are you SOB when sitting or when up moving around? Both  ? ?4. Are you currently experiencing any other symptoms? Sharp pain on the side  ? ?

## 2021-05-31 NOTE — Discharge Instructions (Signed)
Take your Lasix, twice a day with potassium.  Call your cardiologist for follow-up appointment in a week or so.  Try to monitor your weight daily. ?

## 2021-05-31 NOTE — ED Provider Triage Note (Signed)
Emergency Medicine Provider Triage Evaluation Note ? ?Jon Williams , a 71 y.o. male  was evaluated in triage.  Pt complains of waxing and waning shortness of breath for the past 2 weeks but has worsened over the past few days.  Reports having gastric cancer, last chemotherapy in February because he was unable to tolerate this.  Also states some pain to the left side of his ribs.  Unable to sleep. ? ?Review of Systems  ?Positive: As above ?Negative: Nausea, vomiting, dizziness ? ?Physical Exam  ?BP 120/68 (BP Location: Left Arm)   Pulse 66   Temp 98.6 ?F (37 ?C) (Oral)   Resp 16   Ht '5\' 6"'$  (1.676 m)   Wt 92.5 kg   SpO2 99%   BMI 32.93 kg/m?  ?Gen:   Awake, no distress   ?Resp:  Normal effort  ?MSK:   Moves extremities without difficulty  ?Other:  Lung sounds distant but clear, RRR, no abdominal tenderness ? ?Medical Decision Making  ?Medically screening exam initiated at 1:14 PM.  Appropriate orders placed.  Jon Williams was informed that the remainder of the evaluation will be completed by another provider, this initial triage assessment does not replace that evaluation, and the importance of remaining in the ED until their evaluation is complete. ? ? ? ?Per chart review, patient has a history of CHF.  No history of DVT/PE.  He had a CT PE study this morning, x-ray deferred.  No sign of PE on his CT scan. ?  ?Rhae Hammock, PA-C ?05/31/21 1316 ? ?

## 2021-05-31 NOTE — Telephone Encounter (Signed)
Not sure what this is about

## 2021-05-31 NOTE — ED Provider Notes (Signed)
?[facility] ?Provider Note ? ? ?CSN: [id number] ?Arrival date & time: 05/31/21  1253 ? ?  ? ?History ? ?Chief Complaint  ?Patient presents with  ? Shortness of Breath  ? ? ?Jon Williams is a 71 y.o. male. ? ?HPI ?Patient presented today for evaluation of both chest discomfort and shortness of breath.  He saw his PCP, 6 days ago at that time CT angiogram was ordered.  It was done this morning.  Since patient had persistent shortness of breath he called his cardiologist who referred him to come here.  He takes Lasix intermittently, for lower extremity edema, last dose was about 4 days ago according to wife.  Patient is currently on hiatus from chemotherapy for gastric cancer because of intolerance of the chemotherapy, which was last done in February 2023.  He plans on seeing his oncologist next month.  He has history of congestive heart failure with an EF in the 44s.  Recently has had some coughing producing sputum occasionally.  He has shortness of breath whether walking or lying down.  He feels generally weak. ?  ? ?Home Medications ?Prior to Admission medications   ?Medication Sig Start Date End Date Taking? Authorizing Provider  ?atorvastatin (LIPITOR) 40 MG tablet Take 1 tablet (40 mg total) by mouth daily. 04/24/21  Yes [name], MD  ?Biotin 1000 MCG tablet Take 1,000 mcg by mouth daily with breakfast.   Yes [provider]  ?carvedilol (COREG) 3.125 MG tablet Take 3.125 mg by mouth 2 (two) times daily with a meal.   Yes [provider]  ?cholecalciferol (VITAMIN D3) 25 MCG (1000 UT) tablet Take 1,000 Units by mouth daily with breakfast.   Yes [provider]  ?gabapentin (NEURONTIN) 100 MG capsule Take 1 capsule (100 mg total) by mouth 3 (three) times daily. Start at night, and gradually increase dose as tolerates 05/12/21  Yes Truitt Merle, MD  ?metFORMIN (GLUCOPHAGE-XR) 500 MG 24 hr tablet Take 500 mg by mouth every evening. 06/13/20  Yes  [provider]  ?multivitamin (ONE-A-DAY MEN'S) TABS tablet Take 1 tablet by mouth daily with breakfast.   Yes [provider]  ?mupirocin ointment (BACTROBAN) 2 % Apply 1 application. topically daily. For wound care ?Patient taking differently: Apply 1 application. topically See admin instructions. Apply to the right foot as a part of wound care regimen every three days 05/05/21  Yes Stover, Lady Saucier, DPM  ?sacubitril-valsartan (ENTRESTO) 24-26 MG Take 1 tablet by mouth 2 (two) times daily. 05/16/21  Yes [name], MD  ?sotalol (BETAPACE) 80 MG tablet Take 1.5 tablets (120 mg total) by mouth in the morning and at bedtime. 03/01/21  Yes [name], MD  ?traMADol (ULTRAM) 50 MG tablet Take 1 tablet (50 mg total) by mouth every 12 (twelve) hours as needed. ?Patient taking differently: Take 50 mg by mouth every 12 (twelve) hours as needed (for pain). 04/21/21  Yes Truitt Merle, MD  ?docusate sodium (COLACE) 100 MG capsule Take 1 capsule (100 mg total) by mouth 2 (two) times daily as needed for mild constipation. ?Patient not taking: Reported on 05/31/2021 01/06/21   Davonna Belling, MD  ?furosemide (LASIX) 40 MG tablet Take 1 tablet (40 mg total) by mouth 2 (two) times daily. 05/31/21   Daleen Bo, MD  ?pantoprazole (PROTONIX) 40 MG tablet Take 1 tablet (40 mg total) by mouth 2 (two) times daily. ?Patient not taking: Reported on 05/31/2021 12/02/20   Aline August, MD  ?  polyethylene glycol (MIRALAX / GLYCOLAX) 17 g packet Take 17 g by mouth daily as needed for moderate constipation. ?Patient not taking: Reported on 05/31/2021 01/06/21   Davonna Belling, MD  ?potassium chloride (KLOR-CON) 10 MEQ tablet Twice daily 05/31/21   Daleen Bo, MD  ?prochlorperazine (COMPAZINE) 10 MG tablet Take 1 tablet (10 mg total) by mouth every 6 (six) hours as needed (Nausea or vomiting). 02/15/21 05/12/21  Truitt Merle, MD  ?   ? ?Allergies    ?Aspirin and Sulfa antibiotics   ? ?Review of Systems   ?Review of  Systems ? ?Physical Exam ?Updated Vital Signs ?BP 114/83   Pulse 60   Temp 98.7 ?F (37.1 ?C) (Oral)   Resp 19   Ht '5\' 6"'$  (1.676 m)   Wt 92.5 kg   SpO2 100%   BMI 32.93 kg/m?  ?Physical Exam ?Vitals and nursing note reviewed.  ?Constitutional:   ?   Appearance: He is well-developed. He is not ill-appearing.  ?HENT:  ?   Head: Normocephalic and atraumatic.  ?   Right Ear: External ear normal.  ?   Left Ear: External ear normal.  ?Eyes:  ?   Conjunctiva/sclera: Conjunctivae normal.  ?   Pupils: Pupils are equal, round, and reactive to light.  ?Neck:  ?   Trachea: Phonation normal.  ?Cardiovascular:  ?   Rate and Rhythm: Normal rate and regular rhythm.  ?   Heart sounds: Normal heart sounds.  ?Pulmonary:  ?   Effort: Pulmonary effort is normal. No respiratory distress.  ?   Breath sounds: Normal breath sounds. No stridor.  ?Abdominal:  ?   Palpations: Abdomen is soft.  ?   Tenderness: There is no abdominal tenderness.  ?Musculoskeletal:     ?   General: Normal range of motion.  ?   Cervical back: Normal range of motion and neck supple.  ?   Right lower leg: No tenderness. No edema.  ?   Left lower leg: No tenderness. No edema.  ?Skin: ?   General: Skin is warm and dry.  ?Neurological:  ?   Mental Status: He is alert and oriented to person, place, and time.  ?   Cranial Nerves: No cranial nerve deficit.  ?   Sensory: No sensory deficit.  ?   Motor: No abnormal muscle tone.  ?   Coordination: Coordination normal.  ?Psychiatric:     ?   Mood and Affect: Mood normal.     ?   Behavior: Behavior normal.     ?   Thought Content: Thought content normal.     ?   Judgment: Judgment normal.  ? ? ?ED Results / Procedures / Treatments   ?Labs ?(all labs ordered are listed, but only abnormal results are displayed) ?Labs Reviewed  ?COMPREHENSIVE METABOLIC PANEL - Abnormal; Notable for the following components:  ?    Result Value  ? CO2 21 (*)   ? Glucose, Bld 103 (*)   ? Total Protein 6.1 (*)   ? All other components within  normal limits  ?BRAIN NATRIURETIC PEPTIDE - Abnormal; Notable for the following components:  ? B Natriuretic Peptide 1,335.8 (*)   ? All other components within normal limits  ?CBC WITH DIFFERENTIAL/PLATELET - Abnormal; Notable for the following components:  ? RBC 3.49 (*)   ? Hemoglobin 11.7 (*)   ? HCT 36.0 (*)   ? MCV 103.2 (*)   ? All other components within normal limits  ?LIPASE, BLOOD  ? ? ?  EKG ?None ? ?Radiology ?CT Angio Chest Pulmonary Embolism (PE) W or WO Contrast ? ?Result Date: 05/31/2021 ?CLINICAL DATA:  History of prostate and gastric cancer, complaining of shortness of breath and left chest pain for 1 week EXAM: CT ANGIOGRAPHY CHEST WITH CONTRAST TECHNIQUE: Multidetector CT imaging of the chest was performed using the standard protocol during bolus administration of intravenous contrast. Multiplanar CT image reconstructions and MIPs were obtained to evaluate the vascular anatomy. RADIATION DOSE REDUCTION: This exam was performed according to the departmental dose-optimization program which includes automated exposure control, adjustment of the mA and/or kV according to patient size and/or use of iterative reconstruction technique. CONTRAST:  58m OMNIPAQUE IOHEXOL 350 MG/ML SOLN COMPARISON:  CT chest 04/13/2021 FINDINGS: Cardiovascular: There is adequate opacification of the pulmonary arteries to the segmental level. There is no evidence of pulmonary embolism. The main pulmonary artery is enlarged measuring up to 3.9 cm, in the right atrium is enlarged with reflux of contrast into the hepatic veins and IVC, consistent with right heart dysfunction. There is no pericardial effusion. There are coronary artery calcifications. A left chest wall cardiac device is in place, unchanged. Mediastinum/Nodes: The imaged thyroid is grossly unremarkable. The esophagus is grossly unremarkable. There is no mediastinal, hilar, or axillary lymphadenopathy. Lungs/Pleura: The trachea and central airways are patent There  is smooth interlobular septal thickening. There is a small right pleural effusion, new since 04/13/2021. There is no focal consolidation. There is no left effusion. There is no pneumothorax. There are no suspicious no

## 2021-06-02 ENCOUNTER — Encounter (HOSPITAL_COMMUNITY): Payer: Self-pay | Admitting: Gastroenterology

## 2021-06-06 NOTE — Progress Notes (Signed)
?Cardiology Office Note:   ? ?Date:  06/07/2021  ? ?ID:  Jon Williams, DOB [birth year], MRN [id number] ? ?PCP:  [name], MD ?  ?[facility] Providers ?Cardiologist:  Mertie Moores, MD ?Electrophysiologist:  Cristopher Peru, MD  ?Click to update primary MD,subspecialty MD or APP then REFRESH:1}   ? ?Referring MD: [name], MD  ? ?Chief Complaint: shortness of breath ? ?History of Present Illness:   ? ?Jon Williams is a 71 y.o. male with a hx of atrial fibrillation, nonobstructive CAD, NICM, LBBB, chronic combined CHF, gastric cancer, and anemia. ? ?Initially presented in January 2018 following ER visit with BLE edema and increased shortness of breath.  Echocardiogram revealed severely depressed LVEF at 15%, diffuse HK. Right and left heart cath revealed mild to moderate non-obstructive CAD consistent with non-ischemic cardiomyopathy, upper normal to mildly elevated left and right heart filling pressures, mild pulmonary hypertension. He underwent ICD implant 05/2016 and lead revision 09/2018. Found to have atrial fibrillation March 2018. ? ?He was last seen in our office on 03/13/2021 by Dr. Lovena Le.  Normal device function with over 2 years of battery longevity. ? ?He called our office 05/31/2021 with concerns of shortness of breath and was advised to go to the ED. he had not taken Lasix for approximately 4 days.  On a hiatus from chemotherapy for his gastric cancer. He was felt to be stable to return home with advice to take Lasix twice daily and follow-up with cardiology. ? ?Today, he is here today accompanied by his wife.  He is in a wheelchair due to limited mobility of legs as a result of chemotherapy.  He is scheduled to see oncology for discussion of starting a new chemotherapy in a few days. Reports shortness of breath has significantly improved since going to the ED. had some chest pain that accompanied shortness of breath which has also resolved.  Wife reports blood pressure is low at times at  home and she only holds medication if SBP < 100 mmHg. He denies chest pain, shortness of breath, lower extremity edema, fatigue, palpitations, melena, hematuria, hemoptysis, diaphoresis, weakness, presyncope, syncope, orthopnea, and PND. ? ?Past Medical History:  ?Diagnosis Date  ? AICD (automatic cardioverter/defibrillator) present 05/25/2016  ? biv icd  ? Bunion, right   ? Chronic combined systolic and diastolic CHF (congestive heart failure) ([place])   ? Echo 1/18: Mild conc LVH, EF 15-20, severe diff HK, inf and inf-septal AK, Gr 3 DD, mild to mod MR, severe LAE, mod reduced RVSF, mod RAE, mild TR, PASP 50  ? Coronary artery disease involving native coronary artery without angina pectoris 04/17/2016  ? LHC 1/18: pLCx 30, mLCx 20, mRCA 40, dRCA 20, LVEDP 23, mean RA 8, PA 42/20, PCWP 17  ? Diabetes mellitus without complication ([place])   ? DJD (degenerative joint disease)   ? History of atrial fibrillation   ? History of atrial flutter   ? History of cardiomegaly 06/07/2016  ? Noted on CXR  ? History of colon polyps 06/28/2017  ? Noted on colonoscopy  ? LBBB (left bundle branch block)   ? NICM (nonischemic cardiomyopathy) ([place])   ? Echo 1/18:  Mild conc LVH, EF 15-20, severe diff HK, inf and inf-septal AK, Gr 3 DD, mild to mod MR, severe LAE, mod reduced RVSF, mod RAE, mild TR, PASP 50  ? OA (osteoarthritis)   ? knee  ? Other secondary pulmonary hypertension ([place]) 04/17/2016  ? Prostate cancer ([place]) 2019  ? Sigmoid  diverticulosis 06/28/2017  ? Noted on colonoscopy  ? ? ?Past Surgical History:  ?Procedure Laterality Date  ? BIOPSY  12/01/2020  ? Procedure: BIOPSY;  Surgeon: [name], MD;  Location: [facility] ENDOSCOPY;  Service: Endoscopy;;  ? BIV ICD INSERTION CRT-D N/A 05/25/2016  ? Procedure: BiV ICD Insertion CRT-D;  Surgeon: [name], MD;  Location: [facility] CV LAB;  Service: Cardiovascular;  Laterality: N/A;  ? CARDIAC CATHETERIZATION N/A 03/02/2016  ? Procedure: Right/Left Heart Cath and Coronary Angiography;   Surgeon: Nelva Bush, MD;  Location: [facility] LAB;  Service: Cardiovascular;  Laterality: N/A;  ? CARDIOVERSION N/A 07/17/2016  ? Procedure: Cardioversion;  Surgeon: [name], MD;  Location: [facility];  Service: Cardiovascular;  Laterality: N/A;  ? COLONOSCOPY WITH PROPOFOL N/A 06/28/2017  ? Procedure: COLONOSCOPY WITH PROPOFOL;  Surgeon: [name], MD;  Location: [facility] ENDOSCOPY;  Service: Endoscopy;  Laterality: N/A;  ? colonscopy  2009  ? ESOPHAGOGASTRODUODENOSCOPY N/A 12/01/2020  ? Procedure: ESOPHAGOGASTRODUODENOSCOPY (EGD);  Surgeon: [name], MD;  Location: [facility];  Service: Endoscopy;  Laterality: N/A;  IDA/guaiac positive stools  ? ESOPHAGOGASTRODUODENOSCOPY (EGD) WITH PROPOFOL N/A 12/16/2020  ? Procedure: ESOPHAGOGASTRODUODENOSCOPY (EGD) WITH PROPOFOL;  Surgeon: [name], MD;  Location: [facility] ENDOSCOPY;  Service: Endoscopy;  Laterality: N/A;  ? GOLD SEED IMPLANT N/A 12/24/2017  ? Procedure: GOLD SEED IMPLANT, TRANSERINEAL;  Surgeon: Festus Aloe, MD;  Location: [facility] ORS;  Service: Urology;  Laterality: N/A;  ? INSERT / REPLACE / REMOVE PACEMAKER    ? LEAD REVISION  10/10/2018  ? LEAD REVISION/REPAIR N/A 10/10/2018  ? Procedure: LEAD REVISION/REPAIR;  Surgeon: [name], MD;  Location: [facility];  Service: Cardiovascular;  Laterality: N/A;  ? POLYPECTOMY  06/28/2017  ? Procedure: POLYPECTOMY;  Surgeon: [name], MD;  Location: [facility] ENDOSCOPY;  Service: Endoscopy;;  ascending and descending colon polyp  ? PORTACATH PLACEMENT Right 12/23/2020  ? Procedure: INSERTION PORT-A-CATH;  Surgeon: [name], MD;  Location: [facility];  Service: General;  Laterality: Right;  ? PROSTATE BIOPSY  02/20/2017  ? SPACE OAR INSTILLATION N/A 12/24/2017  ? Procedure: SPACE OAR INSTILLATION;  Surgeon: Festus Aloe, MD;  Location: [facility];  Service: Urology;  Laterality: N/A;  ? TOTAL KNEE ARTHROPLASTY Right 08/16/2017  ? Procedure: RIGHT TOTAL KNEE ARTHROPLASTY;  Surgeon:  [name], MD;  Location: [facility];  Service: Orthopedics;  Laterality: Right;  ? UPPER ESOPHAGEAL ENDOSCOPIC ULTRASOUND (EUS) N/A 12/16/2020  ? Procedure: UPPER ESOPHAGEAL ENDOSCOPIC ULTRASOUND (EUS);  Surgeon: [name], MD;  Location: [facility];  Service: Endoscopy;  Laterality: N/A;  ? ? ?Current Medications: ?Current Meds  ?Medication Sig  ? atorvastatin (LIPITOR) 40 MG tablet Take 1 tablet (40 mg total) by mouth daily.  ? Biotin 1000 MCG tablet Take 1,000 mcg by mouth daily with breakfast.  ? cholecalciferol (VITAMIN D3) 25 MCG (1000 UT) tablet Take 1,000 Units by mouth daily with breakfast.  ? docusate sodium (COLACE) 100 MG capsule Take 1 capsule (100 mg total) by mouth 2 (two) times daily as needed for mild constipation.  ? furosemide (LASIX) 40 MG tablet Take 1/2 tablet by mouth daily, may take 1/2 tablet extra daily for fluid  ? gabapentin (NEURONTIN) 100 MG capsule Take 1 capsule (100 mg total) by mouth 3 (three) times daily. Start at night, and gradually increase dose as tolerates  ? metFORMIN (GLUCOPHAGE-XR) 500 MG 24 hr tablet Take 500 mg by mouth every evening.  ? metoprolol succinate (TOPROL  XL) 25 MG 24 hr tablet Take 1 tablet (25 mg total) by mouth daily.  ? multivitamin (ONE-A-DAY MEN'S) TABS tablet Take 1 tablet by mouth daily with breakfast.  ? mupirocin ointment (BACTROBAN) 2 % Apply 1 application. topically daily. For wound care  ? pantoprazole (PROTONIX) 40 MG tablet Take 1 tablet (40 mg total) by mouth 2 (two) times daily.  ? polyethylene glycol (MIRALAX / GLYCOLAX) 17 g packet Take 17 g by mouth daily as needed for moderate constipation.  ? potassium chloride (KLOR-CON) 10 MEQ tablet Twice daily  ? sacubitril-valsartan (ENTRESTO) 24-26 MG Take 1 tablet by mouth 2 (two) times daily.  ? sotalol (BETAPACE) 80 MG tablet Take 1.5 tablets (120 mg total) by mouth in the morning and at bedtime.  ? traMADol (ULTRAM) 50 MG tablet Take 1 tablet (50 mg total) by mouth every 12 (twelve) hours  as needed.  ? [DISCONTINUED] carvedilol (COREG) 3.125 MG tablet Take 3.125 mg by mouth 2 (two) times daily with a meal.  ? [DISCONTINUED] furosemide (LASIX) 40 MG tablet Take 1 tablet (40 mg total) by

## 2021-06-07 ENCOUNTER — Ambulatory Visit (INDEPENDENT_AMBULATORY_CARE_PROVIDER_SITE_OTHER): Payer: Medicare HMO | Admitting: Nurse Practitioner

## 2021-06-07 ENCOUNTER — Other Ambulatory Visit: Payer: Self-pay | Admitting: Nurse Practitioner

## 2021-06-07 ENCOUNTER — Encounter: Payer: Self-pay | Admitting: Nurse Practitioner

## 2021-06-07 VITALS — BP 80/60 | HR 64 | Ht 66.0 in | Wt 188.0 lb

## 2021-06-07 DIAGNOSIS — I959 Hypotension, unspecified: Secondary | ICD-10-CM

## 2021-06-07 DIAGNOSIS — I5042 Chronic combined systolic (congestive) and diastolic (congestive) heart failure: Secondary | ICD-10-CM | POA: Diagnosis not present

## 2021-06-07 DIAGNOSIS — I428 Other cardiomyopathies: Secondary | ICD-10-CM | POA: Diagnosis not present

## 2021-06-07 DIAGNOSIS — I2510 Atherosclerotic heart disease of native coronary artery without angina pectoris: Secondary | ICD-10-CM | POA: Diagnosis not present

## 2021-06-07 DIAGNOSIS — I10 Essential (primary) hypertension: Secondary | ICD-10-CM

## 2021-06-07 DIAGNOSIS — Z95810 Presence of automatic (implantable) cardiac defibrillator: Secondary | ICD-10-CM | POA: Diagnosis not present

## 2021-06-07 DIAGNOSIS — I4819 Other persistent atrial fibrillation: Secondary | ICD-10-CM

## 2021-06-07 MED ORDER — FUROSEMIDE 40 MG PO TABS: ORAL_TABLET | ORAL | 3 refills | Status: DC

## 2021-06-07 MED ORDER — METOPROLOL SUCCINATE ER 25 MG PO TB24: 25.0000 mg | ORAL_TABLET | Freq: Every day | ORAL | 1 refills | Status: DC

## 2021-06-07 NOTE — Patient Instructions (Signed)
Medication Instructions:  ?Your physician has recommended you make the following change in your medication:  ?DECREASE Lasix to 20 mg daily. Take an extra 20 mg if you feel fluid is up ?STOP Carvedilol ?START Metoprolol succinate (Toprol XL) 25 mg at night -  START on Thursday April 27 ? ?*If you need a refill on your cardiac medications before your next appointment, please call your pharmacy* ? ? ?Lab Work: ?None Ordered ?If you have labs (blood work) drawn today and your tests are completely normal, you will receive your results only by: ?MyChart Message (if you have MyChart) OR ?A paper copy in the mail ?If you have any lab test that is abnormal or we need to change your treatment, we will call you to review the results. ? ? ?Testing/Procedures: ?None Ordered ? ? ?Follow-Up: ?At [facility], you and your health needs are our priority.  As part of our continuing mission to provide you with exceptional heart care, we have created designated Provider Care Teams.  These Care Teams include your primary Cardiologist (physician) and Advanced Practice Providers (APPs -  Physician Assistants and Nurse Practitioners) who all work together to provide you with the care you need, when you need it. ? ?We recommend signing up for the patient portal called "MyChart".  Sign up information is provided on this After Visit Summary.  MyChart is used to connect with patients for Virtual Visits (Telemedicine).  Patients are able to view lab/test results, encounter notes, upcoming appointments, etc.  Non-urgent messages can be sent to your provider as well.   ?To learn more about what you can do with MyChart, go to [email or web address].   ? ?Your next appointment:   ?5 month(s) ? ?The format for your next appointment:   ?In Person ? ?Provider:   ?Mertie Moores, MD  or Robbie Lis, PA-C, Christen Bame, NP, or [name], PA-C       ? ? ?Other Instructions ? ? ?Important Information About Sugar ? ? ? ? ?  ?

## 2021-06-08 ENCOUNTER — Other Ambulatory Visit: Payer: Self-pay | Admitting: Hematology

## 2021-06-09 ENCOUNTER — Encounter (HOSPITAL_COMMUNITY): Payer: Self-pay | Admitting: Gastroenterology

## 2021-06-09 ENCOUNTER — Ambulatory Visit (HOSPITAL_BASED_OUTPATIENT_CLINIC_OR_DEPARTMENT_OTHER): Payer: Medicare HMO | Admitting: Certified Registered"

## 2021-06-09 ENCOUNTER — Encounter (HOSPITAL_COMMUNITY)
Admission: RE | Disposition: A | Discharge status: Home / Self Care | Payer: Self-pay | Source: Home / Self Care | Attending: Gastroenterology

## 2021-06-09 ENCOUNTER — Ambulatory Visit: Payer: Medicare HMO

## 2021-06-09 ENCOUNTER — Ambulatory Visit (HOSPITAL_COMMUNITY): Payer: Medicare HMO | Admitting: Certified Registered"

## 2021-06-09 ENCOUNTER — Encounter: Payer: Medicare HMO | Admitting: Dietician

## 2021-06-09 ENCOUNTER — Other Ambulatory Visit: Payer: Self-pay

## 2021-06-09 ENCOUNTER — Other Ambulatory Visit: Payer: Medicare HMO

## 2021-06-09 ENCOUNTER — Ambulatory Visit (HOSPITAL_COMMUNITY)
Admission: RE | Admit: 2021-06-09 | Discharge: 2021-06-09 | Disposition: A | Discharge status: Home / Self Care | Payer: Medicare HMO | Attending: Gastroenterology | Admitting: Gastroenterology

## 2021-06-09 ENCOUNTER — Ambulatory Visit: Payer: Medicare HMO | Admitting: Hematology

## 2021-06-09 DIAGNOSIS — I2510 Atherosclerotic heart disease of native coronary artery without angina pectoris: Secondary | ICD-10-CM | POA: Insufficient documentation

## 2021-06-09 DIAGNOSIS — Z95810 Presence of automatic (implantable) cardiac defibrillator: Secondary | ICD-10-CM | POA: Insufficient documentation

## 2021-06-09 DIAGNOSIS — Z85028 Personal history of other malignant neoplasm of stomach: Secondary | ICD-10-CM | POA: Diagnosis not present

## 2021-06-09 DIAGNOSIS — I509 Heart failure, unspecified: Secondary | ICD-10-CM

## 2021-06-09 DIAGNOSIS — E119 Type 2 diabetes mellitus without complications: Secondary | ICD-10-CM | POA: Diagnosis not present

## 2021-06-09 DIAGNOSIS — Z8249 Family history of ischemic heart disease and other diseases of the circulatory system: Secondary | ICD-10-CM | POA: Diagnosis not present

## 2021-06-09 DIAGNOSIS — K2900 Acute gastritis without bleeding: Secondary | ICD-10-CM | POA: Diagnosis not present

## 2021-06-09 DIAGNOSIS — I110 Hypertensive heart disease with heart failure: Secondary | ICD-10-CM | POA: Diagnosis not present

## 2021-06-09 DIAGNOSIS — I5042 Chronic combined systolic (congestive) and diastolic (congestive) heart failure: Secondary | ICD-10-CM | POA: Insufficient documentation

## 2021-06-09 DIAGNOSIS — Z833 Family history of diabetes mellitus: Secondary | ICD-10-CM | POA: Insufficient documentation

## 2021-06-09 DIAGNOSIS — I2729 Other secondary pulmonary hypertension: Secondary | ICD-10-CM | POA: Diagnosis not present

## 2021-06-09 DIAGNOSIS — K2950 Unspecified chronic gastritis without bleeding: Secondary | ICD-10-CM | POA: Diagnosis not present

## 2021-06-09 DIAGNOSIS — Z08 Encounter for follow-up examination after completed treatment for malignant neoplasm: Secondary | ICD-10-CM | POA: Insufficient documentation

## 2021-06-09 DIAGNOSIS — M1990 Unspecified osteoarthritis, unspecified site: Secondary | ICD-10-CM | POA: Insufficient documentation

## 2021-06-09 DIAGNOSIS — I4891 Unspecified atrial fibrillation: Secondary | ICD-10-CM | POA: Diagnosis not present

## 2021-06-09 DIAGNOSIS — K3189 Other diseases of stomach and duodenum: Secondary | ICD-10-CM | POA: Insufficient documentation

## 2021-06-09 DIAGNOSIS — C169 Malignant neoplasm of stomach, unspecified: Secondary | ICD-10-CM | POA: Diagnosis not present

## 2021-06-09 HISTORY — PX: ESOPHAGOGASTRODUODENOSCOPY (EGD) WITH PROPOFOL: SHX5813

## 2021-06-09 HISTORY — PX: BIOPSY: SHX5522

## 2021-06-09 LAB — GLUCOSE, CAPILLARY: Glucose-Capillary: 88 mg/dL (ref 70–99)

## 2021-06-09 SURGERY — ESOPHAGOGASTRODUODENOSCOPY (EGD) WITH PROPOFOL
Anesthesia: Monitor Anesthesia Care

## 2021-06-09 MED ORDER — GLYCOPYRROLATE 0.2 MG/ML IJ SOLN
INTRAMUSCULAR | Status: DC | PRN
  Administered 2021-06-09: .1 mg via INTRAVENOUS

## 2021-06-09 MED ORDER — PROPOFOL 500 MG/50ML IV EMUL
INTRAVENOUS | Status: DC | PRN
  Administered 2021-06-09: 150 ug/kg/min via INTRAVENOUS

## 2021-06-09 MED ORDER — BUTAMBEN-TETRACAINE-BENZOCAINE 2-2-14 % EX AERO
INHALATION_SPRAY | CUTANEOUS | Status: DC | PRN
  Administered 2021-06-09: 1 via TOPICAL

## 2021-06-09 MED ORDER — LIDOCAINE HCL (CARDIAC) PF 100 MG/5ML IV SOSY
PREFILLED_SYRINGE | INTRAVENOUS | Status: DC | PRN
Start: 2021-06-09 — End: 2021-06-09
  Administered 2021-06-09: 50 mg via INTRAVENOUS

## 2021-06-09 MED ORDER — LACTATED RINGERS IV SOLN
INTRAVENOUS | Status: DC
  Administered 2021-06-09: 1000 mL via INTRAVENOUS

## 2021-06-09 MED ORDER — PHENYLEPHRINE 80 MCG/ML (10ML) SYRINGE FOR IV PUSH (FOR BLOOD PRESSURE SUPPORT)
PREFILLED_SYRINGE | INTRAVENOUS | Status: DC | PRN
  Administered 2021-06-09 (×2): 80 ug via INTRAVENOUS

## 2021-06-09 MED ORDER — PROPOFOL 10 MG/ML IV BOLUS
INTRAVENOUS | Status: DC | PRN
Start: 2021-06-09 — End: 2021-06-09
  Administered 2021-06-09: 10 mg via INTRAVENOUS
  Administered 2021-06-09: 20 mg via INTRAVENOUS

## 2021-06-09 MED ORDER — EPHEDRINE SULFATE-NACL 50-0.9 MG/10ML-% IV SOSY
PREFILLED_SYRINGE | INTRAVENOUS | Status: DC | PRN
  Administered 2021-06-09: 5 mg via INTRAVENOUS

## 2021-06-09 SURGICAL SUPPLY — 15 items

## 2021-06-09 NOTE — Transfer of Care (Signed)
Immediate Anesthesia Transfer of Care Note ? ?Patient: Jon Williams ? ?Procedure(s) Performed: ESOPHAGOGASTRODUODENOSCOPY (EGD) WITH PROPOFOL ?BIOPSY ? ?Patient Location: PACU ? ?Anesthesia Type:MAC ? ?Level of Consciousness: drowsy ? ?Airway & Oxygen Therapy: Patient Spontanous Breathing and Patient connected to face mask oxygen ? ?Post-op Assessment: Report given to RN and Post -op Vital signs reviewed and stable ? ?Post vital signs: Reviewed and stable ? ?Last Vitals:  ?Vitals Value Taken Time  ?BP 104/64 06/09/21 1030  ?Temp    ?Pulse 60 06/09/21 1032  ?Resp 23 06/09/21 1032  ?SpO2 98 % 06/09/21 1032  ?Vitals shown include unvalidated device data. ? ?Last Pain:  ?Vitals:  ? 06/09/21 0909  ?TempSrc: Temporal  ?PainSc: 0-No pain  ?   ? ?  ? ?Complications: No notable events documented. ?

## 2021-06-09 NOTE — Anesthesia Postprocedure Evaluation (Signed)
Anesthesia Post Note ? ?Patient: Jon Williams ? ?Procedure(s) Performed: ESOPHAGOGASTRODUODENOSCOPY (EGD) WITH PROPOFOL ?BIOPSY ? ?  ? ?Patient location during evaluation: PACU ?Anesthesia Type: MAC ?Level of consciousness: awake and alert ?Pain management: pain level controlled ?Vital Signs Assessment: post-procedure vital signs reviewed and stable ?Respiratory status: spontaneous breathing ?Cardiovascular status: stable ?Anesthetic complications: no ? ? ?No notable events documented. ? ?Last Vitals:  ?Vitals:  ? 06/09/21 1040 06/09/21 1050  ?BP: (!) 96/55 109/75  ?Pulse: 61 60  ?Resp: (!) 21 16  ?Temp:    ?SpO2: 92% 92%  ?  ?Last Pain:  ?Vitals:  ? 06/09/21 1050  ?TempSrc:   ?PainSc: 0-No pain  ? ? ?  ?  ?  ?  ?  ?  ? ?Nolon Nations ? ? ? ? ?

## 2021-06-09 NOTE — H&P (Signed)
Jon Williams ?HPI: He is struggling with his gastric cancer treatment.  The patient was previously ambulatory, but now he is in a wheelchair.  He was recently evaluated by Dr. Zenia Resides and surgery is not being pursued as his performance status appears to be declining.  A repeat EGD was requested by Dr. Burr Medico to assess the situation.  ? ?Past Medical History:  ?Diagnosis Date  ? AICD (automatic cardioverter/defibrillator) present 05/25/2016  ? biv icd  ? Bunion, right   ? Chronic combined systolic and diastolic CHF (congestive heart failure) ([place])   ? Echo 1/18: Mild conc LVH, EF 15-20, severe diff HK, inf and inf-septal AK, Gr 3 DD, mild to mod MR, severe LAE, mod reduced RVSF, mod RAE, mild TR, PASP 50  ? Coronary artery disease involving native coronary artery without angina pectoris 04/17/2016  ? LHC 1/18: pLCx 30, mLCx 20, mRCA 40, dRCA 20, LVEDP 23, mean RA 8, PA 42/20, PCWP 17  ? Diabetes mellitus without complication ([place])   ? DJD (degenerative joint disease)   ? History of atrial fibrillation   ? History of atrial flutter   ? History of cardiomegaly 06/07/2016  ? Noted on CXR  ? History of colon polyps 06/28/2017  ? Noted on colonoscopy  ? LBBB (left bundle branch block)   ? NICM (nonischemic cardiomyopathy) ([place])   ? Echo 1/18:  Mild conc LVH, EF 15-20, severe diff HK, inf and inf-septal AK, Gr 3 DD, mild to mod MR, severe LAE, mod reduced RVSF, mod RAE, mild TR, PASP 50  ? OA (osteoarthritis)   ? knee  ? Other secondary pulmonary hypertension ([place]) 04/17/2016  ? Prostate cancer ([place]) 2019  ? Sigmoid diverticulosis 06/28/2017  ? Noted on colonoscopy  ? ? ?Past Surgical History:  ?Procedure Laterality Date  ? BIOPSY  12/01/2020  ? Procedure: BIOPSY;  Surgeon: [name], MD;  Location: [facility] ENDOSCOPY;  Service: Endoscopy;;  ? BIV ICD INSERTION CRT-D N/A 05/25/2016  ? Procedure: BiV ICD Insertion CRT-D;  Surgeon: [name], MD;  Location: [place] CV LAB;  Service: Cardiovascular;  Laterality: N/A;  ?  CARDIAC CATHETERIZATION N/A 03/02/2016  ? Procedure: Right/Left Heart Cath and Coronary Angiography;  Surgeon: Nelva Bush, MD;  Location: [place] CV LAB;  Service: Cardiovascular;  Laterality: N/A;  ? CARDIOVERSION N/A 07/17/2016  ? Procedure: Cardioversion;  Surgeon: [name], MD;  Location: [place] CV LAB;  Service: Cardiovascular;  Laterality: N/A;  ? COLONOSCOPY WITH PROPOFOL N/A 06/28/2017  ? Procedure: COLONOSCOPY WITH PROPOFOL;  Surgeon: [name], MD;  Location: [facility] ENDOSCOPY;  Service: Endoscopy;  Laterality: N/A;  ? colonscopy  2009  ? ESOPHAGOGASTRODUODENOSCOPY N/A 12/01/2020  ? Procedure: ESOPHAGOGASTRODUODENOSCOPY (EGD);  Surgeon: [name], MD;  Location: [place];  Service: Endoscopy;  Laterality: N/A;  IDA/guaiac positive stools  ? ESOPHAGOGASTRODUODENOSCOPY (EGD) WITH PROPOFOL N/A 12/16/2020  ? Procedure: ESOPHAGOGASTRODUODENOSCOPY (EGD) WITH PROPOFOL;  Surgeon: [name], MD;  Location: [facility] ENDOSCOPY;  Service: Endoscopy;  Laterality: N/A;  ? GOLD SEED IMPLANT N/A 12/24/2017  ? Procedure: GOLD SEED IMPLANT, TRANSERINEAL;  Surgeon: Festus Aloe, MD;  Location: [facility] ORS;  Service: Urology;  Laterality: N/A;  ? INSERT / REPLACE / REMOVE PACEMAKER    ? LEAD REVISION  10/10/2018  ? LEAD REVISION/REPAIR N/A 10/10/2018  ? Procedure: LEAD REVISION/REPAIR;  Surgeon: [name], MD;  Location: [place] CV LAB;  Service: Cardiovascular;  Laterality: N/A;  ? POLYPECTOMY  06/28/2017  ? Procedure: POLYPECTOMY;  Surgeon:  [name], MD;  Location: [facility];  Service: Endoscopy;;  ascending and descending colon polyp  ? PORTACATH PLACEMENT Right 12/23/2020  ? Procedure: INSERTION PORT-A-CATH;  Surgeon: [name], MD;  Location: [place];  Service: General;  Laterality: Right;  ? PROSTATE BIOPSY  02/20/2017  ? SPACE OAR INSTILLATION N/A 12/24/2017  ? Procedure: SPACE OAR INSTILLATION;  Surgeon: Festus Aloe, MD;  Location: [facility] ORS;  Service: Urology;  Laterality:  N/A;  ? TOTAL KNEE ARTHROPLASTY Right 08/16/2017  ? Procedure: RIGHT TOTAL KNEE ARTHROPLASTY;  Surgeon: [name], MD;  Location: [place];  Service: Orthopedics;  Laterality: Right;  ? UPPER ESOPHAGEAL ENDOSCOPIC ULTRASOUND (EUS) N/A 12/16/2020  ? Procedure: UPPER ESOPHAGEAL ENDOSCOPIC ULTRASOUND (EUS);  Surgeon: [name], MD;  Location: [facility];  Service: Endoscopy;  Laterality: N/A;  ? ? ?Family History  ?Problem Relation Age of Onset  ? Hypertension Mother   ? Heart disease Mother   ? Diabetes Mother   ? Diabetes Father   ? Hypertension Father   ? Cancer Father   ?     lung cancer  ? Healthy Sister   ? Heart attack Brother   ? Heart disease Brother 27  ?     + tobacco  ? Healthy Brother   ? ? ?Social History:  reports that he has never smoked. He has never used smokeless tobacco. He reports that he does not drink alcohol and does not use drugs. ? ?Allergies:  ?Allergies  ?Allergen Reactions  ? Aspirin Anaphylaxis and Hives  ? Sulfa Antibiotics Anaphylaxis, Hives, Swelling and Other (See Comments)  ?  Swollen lips  ? ? ?Medications: Scheduled: ?Continuous: ? lactated ringers 1,000 mL (06/09/21 0935)  ? ? ?Results for orders placed or performed during the hospital encounter of 06/09/21 (from the past 24 hour(s))  ?Glucose, capillary     Status: None  ? Collection Time: 06/09/21  9:14 AM  ?Result Value Ref Range  ? Glucose-Capillary 88 70 - 99 mg/dL  ?  ? ?No results found. ? ?ROS:  As stated above in the HPI otherwise negative. ? ?Blood pressure 119/77, pulse 64, temperature (!) 97.5 ?F (36.4 ?C), temperature source Temporal, resp. rate (!) 23, height '5\' 6"'$  (1.676 m), weight 85.3 kg, SpO2 100 %.   ? ?PE: ?Gen: NAD, Alert and Oriented ?HEENT:  [place]/AT, EOMI ?Neck: Supple, no LAD ?Lungs: CTA Bilaterally ?CV: RRR without M/G/R ?ABD: Soft, NTND, +BS ?Ext: No C/C/E ? ?Assessment/Plan: ?1)Gastric cancer - EGD. ? ?Rayona Sardinha D ?06/09/2021, 9:34 AM  ? ? ?  ? ?

## 2021-06-09 NOTE — Progress Notes (Signed)
Pharmacist Chemotherapy Monitoring - Initial Assessment   ? ?Anticipated start date: 06/16/21   ? ?The following has been reviewed per standard work regarding the patient's treatment regimen: ?The patient's diagnosis, treatment plan and drug doses, and organ/hematologic function ?Lab orders and baseline tests specific to treatment regimen  ?The treatment plan start date, drug sequencing, and pre-medications ?Prior authorization status  ?Patient's documented medication list, including drug-drug interaction screen and prescriptions for anti-emetics and supportive care specific to the treatment regimen ?The drug concentrations, fluid compatibility, administration routes, and timing of the medications to be used ?The patient's access for treatment and lifetime cumulative dose history, if applicable  ?The patient's medication allergies and previous infusion related reactions, if applicable  ? ?Changes made to treatment plan:  ?N/A ? ?Follow up needed:  ?N/A ? ? ?Jon Williams, [place], BCPS, BCOP ?06/09/2021  11:43 AM  ?

## 2021-06-09 NOTE — Discharge Instructions (Signed)

## 2021-06-09 NOTE — Anesthesia Preprocedure Evaluation (Addendum)
Anesthesia Evaluation  ?Patient identified by MRN, date of birth, ID band ?Patient awake ? ? ? ?Reviewed: ?Allergy & Precautions, Patient's Chart, lab work & pertinent test results, reviewed documented beta blocker date and time  ? ?Airway ?Mallampati: II ? ?TM Distance: >3 FB ?Neck ROM: Full ? ? ? Dental ? ?(+) Dental Advisory Given ?  ?Pulmonary ?neg pulmonary ROS,  ?  ?breath sounds clear to auscultation ? ? ?(-) rales ? ? ? Cardiovascular ?pulmonary hypertension+ CAD and +CHF  ?+ dysrhythmias Atrial Fibrillation + Cardiac Defibrillator ? ?Rhythm:Regular Rate:Normal ? ?Echo 03/2021 ?1. Left ventricular ejection fraction, by estimation, is 20 to 25%. The left ventricle has severely decreased function. The left ventricle demonstrates global hypokinesis. Left ventricular diastolic parameters are indeterminate.  ??2. RV not well visualized, grossly appears normal in size and function. Right ventricular systolic function was not well visualized. The right ventricular size is not well visualized. Tricuspid regurgitation signal is inadequate for assessing PA pressure.  ??3. Left atrial size was mildly dilated.  ??4. The mitral valve is abnormal. Mild mitral valve regurgitation. No evidence of mitral stenosis.  ??5. The aortic valve was not well visualized. Aortic valve regurgitation is not visualized. No aortic stenosis is present.  ??6. The inferior vena cava is normal in size with greater than 50% respiratory variability, suggesting right atrial pressure of 3 mmHg.  ? ? ?Follows with EP cardiology for hx of HFrEF s/p St Jude dual chamber ICD (EF 20-25%, mod-sev MR), permanent Afib (xarelto on hold with GI bleed and malignancy). Last seen 12/06/20. Per note, "EF ~15% years ago. Update ? ?  ?Neuro/Psych ?negative neurological ROS ? negative psych ROS  ? GI/Hepatic ?negative GI ROS, Neg liver ROS,   ?Endo/Other  ?diabetes ? Renal/GU ?Renal diseaseLab Results ?     Component                 Value               Date                 ?     CREATININE               1.17                12/02/2020           ?     BUN                      16                  12/02/2020           ?     NA                       133 (L)             12/02/2020           ?     K                        3.9                 12/02/2020           ?     CL  103                 12/02/2020           ?     CO2                      22                  12/02/2020           ?  ? ?  ?Musculoskeletal ? ?(+) Arthritis ,  ? Abdominal ?(+) + obese (BMI 32.60),   ?Peds ? Hematology ? ?(+) Blood dyscrasia, anemia , Lab Results ?     Component                Value               Date                 ?     WBC                      8.3                 12/02/2020           ?     HGB                      8.3 (L)             12/02/2020           ?     HCT                      27.0 (L)            12/02/2020           ?     MCV                      78.7 (L)            12/02/2020           ?     PLT                      246                 12/02/2020           ?   ?Anesthesia Other Findings ?All: ASA, Sulfa Abx ? Reproductive/Obstetrics ? ?  ? ? ? ? ? ? ? ? ? ? ? ? ? ?  ?  ? ? ? ? ? ?Anesthesia Physical ? ?Anesthesia Plan ? ?ASA: 4 ? ?Anesthesia Plan: MAC  ? ?Post-op Pain Management: Minimal or no pain anticipated  ? ?Induction: Intravenous ? ?PONV Risk Score and Plan: 1 and Treatment may vary due to age or medical condition, Propofol infusion and TIVA ? ?Airway Management Planned: Natural Airway ? ?Additional Equipment: None ? ?Intra-op Plan:  ? ?Post-operative Plan:  ? ?Informed Consent: I have reviewed the patients History and Physical, chart, labs and discussed the procedure including the risks, benefits and alternatives for the proposed anesthesia with the patient or authorized representative who has indicated his/her understanding and acceptance.  ? ? ? ?Dental advisory given ? ?Plan Discussed with: CRNA ? ?Anesthesia Plan Comments:  (Admitted to ED for lasix due to fluid overload.  Lower extremities without edema to day. No rales. Very high risk, discussed with patient.  ? ? ?Prior case PAT note by Karoline Caldwell, PA-C: ?Follows with EP cardiology for hx of HFrEF s/p St Jude dual chamber ICD (EF 20-25%, mod-sev MR), permanent Afib (xarelto on hold with GI bleed and malignancy). Last seen 12/06/20. Per note, "EF ~15% years ago. Update ? ?Despite that, By revised cardiac risk index Truman Hayward criteria) he is only at a "moderate risk" of peri-operative cardiac complications. Suspect this is "at least moderate" and more likely high risk?given his co-morbidities. Given the nature of the malignancy, do not think his risk is prohibitive. He verbalizes understanding and wishes to move forward with any procedures that can treat his malignancy. Will update echo ASAP to help further risk stratify." Echo was updated 12/07/20 and Dr. Lovena Le commented on result stating, "Despite severe LV dysfunction he is a moderate surgical risk. If there is a possibility of curing his GI malignancy with surgery than I would advocate for proceeding with surgery. If surgery is only palliative then I would not recommend subjecting him to the risk of surgery." ? ?Recent admission Oct 2022 for anemia. Hgb 7.1 in ED. He received 2 units packed red cell transfusion during the hospitalization. ?Hemoglobin 8.3 on discharge. ?EGD on 12/01/2020 showed possible malignant gastric tumor at the incisura which was biopsied ? ?DM2, last A1c 6.2 on 12/01/20. ? ?Will need DOS labs and evaluation.  ? ?TTE 12/07/20: ??1. There is a false tendon in the mid LV cavity of no clinical  ?significance. . Left ventricular ejection fraction, by estimation, is 20  ?to 25%. The left ventricle has severely decreased function. The left  ?ventricle demonstrates global hypokinesis. The  ?left ventricular internal cavity size was severely dilated. Left  ?ventricular diastolic function could not be evaluated. The  average left  ?ventricular global longitudinal strain is 13.9 %. The global longitudinal  ?strain is abnormal.  ??2. Right ventricular systolic function is normal. The right ventricular  ?size is moderately enlarged. There is mildly elevated pulmonary artery  ?systolic pressure. The estimated right ventricular systolic pressure is  ?10.2 mmHg.  ??3. Left atrial size was severely dilated.  ??4. Right atrial size was severely dilated.  ??5. The mitral valve is degenerative. Moderate to severe mitral valve  ?regurgitation. Moderate mitral stenosis. The mean mitral valve gradient is  ?1.0 mmHg. PISA ERO 0.34cm2, RV 53cc.  ??6. Tricuspid valve regurgitation is moderate.  ??7. The aortic valve is tricuspid. Aortic valve regurgitation is not  ?visualized. Mild aortic valve sclerosis is present, with no evidence of  ?aortic valve stenosis.  ??8. The inferior vena cava is normal in size with greater than 50%  ?respiratory variability, suggesting right atrial pressure of 3 mmHg.  ??9. Side by side comparison from study 2018 demonstrates persistence of  ?severe LV dysfunction. Mitral and tricuspid regurgitation have worsened.  ?Consider TEE for further evaulation of MR if clinically indicated.  ? ?R/L cath 03/02/16: ?Conclusions: ?1. Mild to moderate, non-obstructive coronary artery disease consistent with non-ischemic cardiomyopathy. ?2. Upper normal to mildly elevated left and right heart filling pressures. ?3. Mild pulmonary hypertension. ?4. Reduced Fick cardiac output/index. ?? ?Recommendations: ?1. Aggressive medical therapy for non-ischemic cardiomyopathy. ?2. Medical therapy to prevent progression of non-obstructive CAD. ?? ?)  ? ? ? ?Anesthesia Quick Evaluation ? ?

## 2021-06-09 NOTE — Op Note (Signed)
[facility] ?Patient Name: Jon Williams ?Procedure Date: 06/09/2021 ?MRN: [id number] ?Attending MD: [name] , MD ?Date of Birth: 1950-09-16 ?CSN: [id number] ?Age: 71 ?Admit Type: Outpatient ?Procedure:                Upper GI endoscopy ?Indications:              Surveillance procedure ?Providers:                [name], MD, Ervin Knack, RN, Charlean Merl  ?                          Purcell Nails, Merchant [employer], [employer] ?Referring MD:              ?Medicines:                Propofol per Anesthesia ?Complications:            No immediate complications. ?Estimated Blood Loss:     Estimated blood loss: none. ?Procedure:                Pre-Anesthesia Assessment: ?                          - Prior to the procedure, a History and Physical  ?                          was performed, and patient medications and  ?                          allergies were reviewed. The patient's tolerance of  ?                          previous anesthesia was also reviewed. The risks  ?                          and benefits of the procedure and the sedation  ?                          options and risks were discussed with the patient.  ?                          All questions were answered, and informed consent  ?                          was obtained. Prior Anticoagulants: The patient has  ?                          taken no previous anticoagulant or antiplatelet  ?                          agents. ASA Grade Assessment: III - A patient with  ?                          severe systemic disease. After reviewing the risks  ?  and benefits, the patient was deemed in  ?                          satisfactory condition to undergo the procedure. ?                          - Sedation was administered by an anesthesia  ?                          professional. Deep sedation was attained. ?                          After obtaining informed consent, the endoscope was  ?                          passed under  direct vision. Throughout the  ?                          procedure, the patient's blood pressure, pulse, and  ?                          oxygen saturations were monitored continuously. The  ?                          [id number]-[id number] ([id number]) Olympus endoscope was introduced  ?                          through the mouth, and advanced to the second part  ?                          of duodenum. The upper GI endoscopy was  ?                          accomplished without difficulty. The patient  ?                          tolerated the procedure well. ?Scope In: ?Scope Out: ?Findings: ?     The esophagus was normal. ?     A medium healed ulcer was found on the lesser curvature of the stomach.  ?     Biopsies were taken with a cold forceps for histology. ?     The examined duodenum was normal. ?     Compared to the prior EGD there is a marked improvement in his gastric  ?     cancer. There was only evidence of erythema and a central scar at the  ?     incisura. Cold biopsies of the area were obtained, but there was no  ?     gross evidence of malignancy. ?Impression:               - Normal esophagus. ?                          - Scar in the lesser curvature of the stomach.  ?  Biopsied. ?                          - Normal examined duodenum. ?Moderate Sedation: ?     Not Applicable - Patient had care per Anesthesia. ?Recommendation:           - Patient has a contact number available for  ?                          emergencies. The signs and symptoms of potential  ?                          delayed complications were discussed with the  ?                          patient. Return to normal activities tomorrow.  ?                          Written discharge instructions were provided to the  ?                          patient. ?                          - Resume previous diet. ?                          - Continue present medications. ?                          - Await pathology results. ?                           - Follow up with Oncology as scheduled. ?Procedure Code(s):        --- Professional --- ?                          [phone], Esophagogastroduodenoscopy, flexible,  ?                          transoral; with biopsy, single or multiple ?Diagnosis Code(s):        --- Professional --- ?                          K31.89, Other diseases of stomach and duodenum ?CPT copyright 2019 [employer]. All rights reserved. ?The codes documented in this report are preliminary and upon coder review may  ?be revised to meet current compliance requirements. ?[name], MD ?[name], MD ?06/09/2021 10:38:27 AM ?This report has been signed electronically. ?Number of Addenda: 0 ?

## 2021-06-12 LAB — SURGICAL PATHOLOGY

## 2021-06-15 ENCOUNTER — Encounter: Payer: Self-pay | Admitting: Hematology

## 2021-06-15 DIAGNOSIS — Z95828 Presence of other vascular implants and grafts: Secondary | ICD-10-CM | POA: Insufficient documentation

## 2021-06-16 ENCOUNTER — Encounter: Payer: Self-pay | Admitting: Hematology

## 2021-06-16 ENCOUNTER — Inpatient Hospital Stay: Payer: Medicare HMO | Attending: Hematology

## 2021-06-16 ENCOUNTER — Inpatient Hospital Stay: Payer: Medicare HMO | Admitting: Dietician

## 2021-06-16 ENCOUNTER — Inpatient Hospital Stay: Payer: Medicare HMO

## 2021-06-16 ENCOUNTER — Other Ambulatory Visit: Payer: Self-pay

## 2021-06-16 ENCOUNTER — Inpatient Hospital Stay (HOSPITAL_BASED_OUTPATIENT_CLINIC_OR_DEPARTMENT_OTHER): Payer: Medicare HMO | Admitting: Hematology

## 2021-06-16 VITALS — BP 103/83 | HR 68 | Temp 98.0°F | Resp 18 | Wt 192.2 lb

## 2021-06-16 DIAGNOSIS — C169 Malignant neoplasm of stomach, unspecified: Secondary | ICD-10-CM | POA: Diagnosis not present

## 2021-06-16 DIAGNOSIS — C163 Malignant neoplasm of pyloric antrum: Secondary | ICD-10-CM | POA: Diagnosis not present

## 2021-06-16 DIAGNOSIS — C61 Malignant neoplasm of prostate: Secondary | ICD-10-CM | POA: Insufficient documentation

## 2021-06-16 DIAGNOSIS — Z79899 Other long term (current) drug therapy: Secondary | ICD-10-CM | POA: Diagnosis not present

## 2021-06-16 DIAGNOSIS — D509 Iron deficiency anemia, unspecified: Secondary | ICD-10-CM | POA: Insufficient documentation

## 2021-06-16 DIAGNOSIS — Z95828 Presence of other vascular implants and grafts: Secondary | ICD-10-CM

## 2021-06-16 DIAGNOSIS — D500 Iron deficiency anemia secondary to blood loss (chronic): Secondary | ICD-10-CM

## 2021-06-16 LAB — CBC WITH DIFFERENTIAL/PLATELET
Abs Immature Granulocytes: 0.01 10*3/uL (ref 0.00–0.07)
Basophils Absolute: 0 10*3/uL (ref 0.0–0.1)
Basophils Relative: 0 %
Eosinophils Absolute: 0.1 10*3/uL (ref 0.0–0.5)
Eosinophils Relative: 2 %
HCT: 35.8 % — ABNORMAL LOW (ref 39.0–52.0)
Hemoglobin: 11.8 g/dL — ABNORMAL LOW (ref 13.0–17.0)
Immature Granulocytes: 0 %
Lymphocytes Relative: 20 %
Lymphs Abs: 0.9 10*3/uL (ref 0.7–4.0)
MCH: 32.2 pg (ref 26.0–34.0)
MCHC: 33 g/dL (ref 30.0–36.0)
MCV: 97.5 fL (ref 80.0–100.0)
Monocytes Absolute: 0.5 10*3/uL (ref 0.1–1.0)
Monocytes Relative: 10 %
Neutro Abs: 3.2 10*3/uL (ref 1.7–7.7)
Neutrophils Relative %: 68 %
Platelets: 207 10*3/uL (ref 150–400)
RBC: 3.67 MIL/uL — ABNORMAL LOW (ref 4.22–5.81)
RDW: 12.3 % (ref 11.5–15.5)
WBC: 4.8 10*3/uL (ref 4.0–10.5)
nRBC: 0 % (ref 0.0–0.2)

## 2021-06-16 LAB — COMPREHENSIVE METABOLIC PANEL
ALT: 13 U/L (ref 0–44)
AST: 17 U/L (ref 15–41)
Albumin: 4.3 g/dL (ref 3.5–5.0)
Alkaline Phosphatase: 61 U/L (ref 38–126)
Anion gap: 6 (ref 5–15)
BUN: 19 mg/dL (ref 8–23)
CO2: 27 mmol/L (ref 22–32)
Calcium: 9.5 mg/dL (ref 8.9–10.3)
Chloride: 105 mmol/L (ref 98–111)
Creatinine, Ser: 0.92 mg/dL (ref 0.61–1.24)
GFR, Estimated: >60 mL/min (ref >60)
Glucose, Bld: 104 mg/dL — ABNORMAL HIGH (ref 70–99)
Potassium: 3.8 mmol/L (ref 3.5–5.1)
Sodium: 138 mmol/L (ref 135–145)
Total Bilirubin: 0.6 mg/dL (ref 0.3–1.2)
Total Protein: 7.3 g/dL (ref 6.5–8.1)

## 2021-06-16 LAB — TSH: TSH: 1.356 u[IU]/mL (ref 0.350–4.500)

## 2021-06-16 LAB — FERRITIN: Ferritin: 25 ng/mL (ref 24–336)

## 2021-06-16 MED ORDER — SODIUM CHLORIDE 0.9% FLUSH
10.0000 mL | Freq: Once | INTRAVENOUS | Status: AC
  Administered 2021-06-16: 10 mL

## 2021-06-16 NOTE — Progress Notes (Signed)
?Jon Williams   ?[phone] [phone]   ?Clinic Follow up Note  ? ?Patient Care Team: ?[name], MD as PCP - General (Internal Medicine) ?[name], [name], MD as PCP - Cardiology (Cardiology) ?[name], MD as PCP - Electrophysiology (Cardiology) ?[name], MD as Consulting Physician (General Surgery) ?Truitt Merle, MD as Consulting Physician (Hematology) ?[name], MD as Consulting Physician (Gastroenterology) ? ?Date of Service:  06/16/2021 ? ?CHIEF COMPLAINT: f/u of gastric cancer ? ?CURRENT THERAPY:  ?Surveillance ? ?ASSESSMENT & PLAN:  ?Jon Williams is a 71 y.o. male with  ? ?1. Gastric Cancer, cT3N0M0, stage II, MMR deficient, HER2 2+/FISH-, PD-L1 20% ?-initially referred to ED by PCP on 11/30/20 for severe anemia hgb 7.4. He was also recently treated for H-pylori gastritis. EGD on 12/01/20 by Dr. Benson Norway showed a tumor at the incisura, which pathology confirmed was adenocarcinoma.  ?-CT CAP 12/01/20 showed no metastatic disease. ?-He underwent echo on 10/26 showing reduced EF and severe LV dysfunction. Per Dr. Lovena Le, surgery is recommended only if it is curative given his moderate risk. ?-He proceeded to EUS on 12/16/20 under Dr. Benson Norway, staging T3 N0. ?-IHC returned abnormal, showing loss of expression in MLH1 and PMS2. Which predict good response to immunotherapy Keytruda  ?-he started FLOT4 with dose reduction on 01/02/21, increased to full dose and he initially tolerated well. He developed worsening neuropathy and diarrhea after cycle 6, required hospitalization. We have discontinued chemo treatment.  ?-CT AP on 04/01/21 while inpatient showed no acute findings and no evidence of metastatic disease. Gastric wall thickening has resolved, likely response to chemotherapy ?-He saw Dr. Zenia Resides on 04/25/21. Per her note, Dr. Zenia Resides feels his performance status is too poor at this time to consider surgery.  ?-surveillance endoscopy on 06/09/21 by Dr. Benson Norway showed  marked improvement, with only remaining erythema and a central scar. Biopsy of the scar was negative for malignancy. I reviewed the results with them today. ?-given his good response and no residual disease on endoscopy, and slow recovery, I discussed that we can move to surveillance, instead of next line therapy Keytruda.  We will plan for restaging CT in 3 months. May consider repeat EGD in 6 months  ?-labs reviewed, overall stable to improved. ?  ?2.  Right foot deformity, wound  ?-He developed worsening pain and tightness in the right foot, went to podiatry 02/24/21 ?Jon Williams [place], per MD note, showed significant structural bunion deformity with no osteolysis or soft tissue budding  ?-He experienced delayed healing secondary to chemo treatment ?-improved, now followed by home health care ?  ?3.  CIPN, G2 ?-He developed mild intermittent numbness and tingling after cycle 4 FLOT, secondary to oxaliplatin and taxotere ?-He is on gabapentin and recommend B complex ?  ?4. H/o Prostate Cancer  ?-diagnosed with Gleason 4+4 prostate cancer in early 2019. He was treated with long-term ADT in combination with 8 weeks of IMRT. ?  ?5. Diabetes, atrial fibrillation, severe cardiomyopathy with EF 20 to 25%, status post ICD placement ?-Follow-up with PCP and cardiology ?-no signs of fluid overload thus far, continue monitoring closely ?  ?6. Iron deficient anemia ?-Secondary to gastric cancer ?-received 2 doses of IV Venofer, last 12/20/20 ?-hg stable, 11.8 today (06/16/21) ?  ?  ?PLAN: ?-Given the recent negative EGD and biopsy for residual disease, we will start cancer surveillance.  Cancel Keytruda treatment.   ?-Port flush in 6 weeks ?-f/u in 3 months, with flush/lab and CT several days  before ? ? ?No problem-specific Assessment & Plan notes found for this encounter. ? ? ?SUMMARY OF ONCOLOGIC HISTORY: ?Oncology History Overview Note  ?Cancer Staging ?Gastric cancer ([place]) ?Staging form: Stomach, AJCC 8th Edition ?- Clinical stage from  12/01/2020: Stage IIB (cT3, cN0, cM0) - Signed by Truitt Merle, MD on 12/20/2020 ?Stage prefix: Initial diagnosis ?Total positive nodes: 0 ? ?Malignant neoplasm of prostate ([place]) ?Staging form: Prostate, AJCC 8th Edition ?- Clinical: Stage IIC (cT2b, cN0, cM0, PSA: 6.7, Grade Group: 4) - Unsigned ?Prostate specific antigen (PSA) range: Less than 10 ?Gleason score: 8 ?Histologic grading system: 5 grade system ? ?  ?Gastric cancer ([place])  ?12/01/2020 Procedure  ? Upper Endoscopy, Dr. Benson Norway ? ?Impression: ?- Normal esophagus. ?- Malignant gastric tumor at the incisura. Biopsied. ?- Normal examined duodenum. ?  ?12/01/2020 Pathology Results  ? FINAL MICROSCOPIC DIAGNOSIS:  ? ?A. INCISURA, BIOPSY:  ?- Adenocarcinoma, moderate to poorly differentiated arising in a  ?background of chronic gastritis with intestinal metaplasia.  ?  ?12/01/2020 Imaging  ? CT CAP ? ?IMPRESSION: ?Focal wall thickening along the posterior aspect of the gastric ?antrum, likely corresponding to the patient's newly diagnosed ?gastric cancer. ?  ?No findings suspicious for metastatic disease. ?  ?Mild multifocal pneumonia, lower lobe predominant, likely on the ?basis of aspiration. Trace right pleural effusion. ?  ?Fiducial markers along the prostate in this patient with known ?prostate cancer. ?  ?12/01/2020 Cancer Staging  ? Staging form: Stomach, AJCC 8th Edition ?- Clinical stage from 12/01/2020: Stage IIB (cT3, cN0, cM0) - Signed by Truitt Merle, MD on 12/20/2020 ?Stage prefix: Initial diagnosis ?Total positive nodes: 0 ? ?  ?12/08/2020 Initial Diagnosis  ? Gastric cancer ([place]) ?  ?12/16/2020 Procedure  ? EUS ? ?- Wall thickening was seen in the lesser curve of the stomach and in the antrum of the stomach. The thickening appeared to be primarily within the serosa (Layer 5). T3 N0 Mx. ?- There was no sign of significant pathology in the entire pancreas. ?- There was no sign of significant pathology in the common bile duct and in the gallbladder. ?- There  was no evidence of significant pathology in the left lobe of the liver. ?- Endosonographic images of the left adrenal gland were unremarkable. ?- The celiac trunk and superior mesenteric artery were endosonographically normal. ?- The mediastinum was unremarkable endosonographically. ?- No specimens collected. ?  ?01/02/2021 - 03/23/2021 Chemotherapy  ? Patient is on Treatment Plan : GASTROESOPHAGEAL FLOT q14d X 4 cycles  ? ?  ?  ?04/01/2021 Imaging  ? EXAM: ?CT ABDOMEN AND PELVIS WITHOUT CONTRAST ? ?IMPRESSION: ?1. No acute findings in the abdomen or pelvis. Specifically, no ?evidence for metastatic disease in the abdomen or pelvis. ?2. Stable 16 mm left adrenal adenoma. ?3. Small fat containing hernias in the right groin and umbilical ?region. ?4. Bilateral pars interarticularis defects at L4 with 10 mm ?anterolisthesis of L4 on 5, stable. ?5. Aortic Atherosclerosis (ICD10-I70.0). ?  ?04/13/2021 Imaging  ? EXAM: ?CT CHEST WITHOUT CONTRAST ? ?IMPRESSION: ?1. No evidence of metastatic disease in the chest. ?2. Enlargement of the main pulmonary artery, as can be seen in ?pulmonary hypertension. ?3. Coronary artery disease. ?  ?Aortic Atherosclerosis (ICD10-I70.0). ?  ?06/09/2021 Procedure  ? Upper GI Endoscopy, Dr. Benson Norway ? ?Findings: ?-The esophagus was normal. ?-A medium healed ulcer was found on the lesser curvature of the stomach. Biopsies were taken with a cold forceps for histology. ?-The examined duodenum was normal. ?-Compared to the prior EGD  there is a marked improvement in his gastric cancer. There was only evidence of erythema and a central scar at the incisura. Cold biopsies of the area were obtained, but there was no gross evidence of malignancy. ? ?Impression: ?- Normal esophagus. ?- Scar in the lesser curvature of the stomach. Biopsied. ?- Normal examined duodenum. ?  ?06/09/2021 Pathology Results  ? FINAL MICROSCOPIC DIAGNOSIS:  ? ?A. STOMACH, BIOPSY:  ?- Gastric mucosa with acute and chronic inflammation.  ?-  No dysplasia or malignancy identified.  ?  ?06/16/2021 -  Chemotherapy  ? Patient is on Treatment Plan : GASTROESOPHAGEAL Pembrolizumab (200) q21d  ? ?  ?  ? ? ? ?INTERVAL HISTORY:  ?RYVER POBLETE is

## 2021-06-17 LAB — T4: T4, Total: 8.1 ug/dL (ref 4.5–12.0)

## 2021-06-20 DIAGNOSIS — I2510 Atherosclerotic heart disease of native coronary artery without angina pectoris: Secondary | ICD-10-CM | POA: Diagnosis not present

## 2021-06-20 DIAGNOSIS — E7800 Pure hypercholesterolemia, unspecified: Secondary | ICD-10-CM | POA: Diagnosis not present

## 2021-06-20 DIAGNOSIS — N400 Enlarged prostate without lower urinary tract symptoms: Secondary | ICD-10-CM | POA: Diagnosis not present

## 2021-06-20 DIAGNOSIS — G8929 Other chronic pain: Secondary | ICD-10-CM | POA: Diagnosis not present

## 2021-06-20 DIAGNOSIS — E1169 Type 2 diabetes mellitus with other specified complication: Secondary | ICD-10-CM | POA: Diagnosis not present

## 2021-06-29 ENCOUNTER — Telehealth: Payer: Self-pay | Admitting: Nurse Practitioner

## 2021-06-29 NOTE — Telephone Encounter (Signed)
Pt c/o medication issue:  1. Name of Medication:  sotalol (BETAPACE) 80 MG tablet metoprolol succinate (TOPROL-XL) 25 MG 24 hr tablet Xarelto  2. How are you currently taking this medication (dosage and times per day)? Needs to verify  3. Are you having a reaction (difficulty breathing--STAT)? no  4. What is your medication issue? Samantha from [place] calling to verify if the patient is supposed to be on both sotalol and metoprolol, since they are in the same drug class. She also says the patient has completed chemo and would like to know if he needs to restart the xarelto. Phone: [phone]

## 2021-06-30 NOTE — Telephone Encounter (Signed)
   Aldona Bar returning call to f/u

## 2021-06-30 NOTE — Telephone Encounter (Signed)
Spoke with Aldona Bar and gave information from Christen Bame, NP. Aldona Bar states that there are no concerns regarding patient's heart rate and that the pharmacist just wanted to confirm that he should be taking both medications.

## 2021-06-30 NOTE — Telephone Encounter (Signed)
We are aware that patient is taking both sotalol (antiarrhythmic) and metoprolol (beta blocker) for his atrial fibrillation and he should continue both of these unless there is a specific concern regarding his heart rate.  I am not comfortable advising him to restart Xarelto without agreement from oncology due to history of gastric cancer.   Forwarding to Dr. Lovena Le for his review.

## 2021-07-04 ENCOUNTER — Telehealth: Payer: Self-pay

## 2021-07-04 NOTE — Telephone Encounter (Signed)
LVM stating for pt to restart his Xeralto per Dr. Ernestina Penna verbal order.  Instructed pt to restart today or tomorrow.  Please feel free to contact Dr. Ernestina Penna office should you have questions or concerns.

## 2021-07-04 NOTE — Telephone Encounter (Signed)
Jon Williams with [facility] called [phone]) asking if it's "OK" for the pt to restart his Xeralto.  Informed Samantha this RN will ask Dr. Burr Medico and will notify pt with Dr. Ernestina Penna decision.

## 2021-07-11 ENCOUNTER — Ambulatory Visit (INDEPENDENT_AMBULATORY_CARE_PROVIDER_SITE_OTHER): Payer: Medicare HMO

## 2021-07-11 DIAGNOSIS — I428 Other cardiomyopathies: Secondary | ICD-10-CM | POA: Diagnosis not present

## 2021-07-11 LAB — CUP PACEART REMOTE DEVICE CHECK
Battery Remaining Longevity: 26 mo
Battery Remaining Percentage: 30 %
Battery Voltage: 2.9 V
Brady Statistic AP VP Percent: 0 %
Brady Statistic AP VS Percent: 0 %
Brady Statistic AS VP Percent: 0 %
Brady Statistic AS VS Percent: 0 %
Brady Statistic RA Percent Paced: 1 %
Brady Statistic RV Percent Paced: 80 %
Date Time Interrogation Session: 20230529031723
HighPow Impedance: 60 Ohm
HighPow Impedance: 60 Ohm
Implantable Lead Implant Date: 20180413
Implantable Lead Implant Date: 20200828
Implantable Lead Location: 753859
Implantable Lead Location: 753860
Implantable Lead Model: 7122
Implantable Pulse Generator Implant Date: 20180413
Lead Channel Impedance Value: 390 Ohm
Lead Channel Impedance Value: 390 Ohm
Lead Channel Pacing Threshold Amplitude: 0.75 V
Lead Channel Pacing Threshold Amplitude: 0.75 V
Lead Channel Pacing Threshold Pulse Width: 0.5 ms
Lead Channel Pacing Threshold Pulse Width: 0.5 ms
Lead Channel Sensing Intrinsic Amplitude: 2.2 mV
Lead Channel Sensing Intrinsic Amplitude: 6.1 mV
Lead Channel Setting Pacing Amplitude: 2 V
Lead Channel Setting Pacing Amplitude: 2.5 V
Lead Channel Setting Pacing Pulse Width: 0.5 ms
Lead Channel Setting Sensing Sensitivity: 0.5 mV
Pulse Gen Serial Number: 7398151

## 2021-07-20 DIAGNOSIS — I739 Peripheral vascular disease, unspecified: Secondary | ICD-10-CM | POA: Diagnosis not present

## 2021-07-20 DIAGNOSIS — I428 Other cardiomyopathies: Secondary | ICD-10-CM | POA: Diagnosis not present

## 2021-07-20 DIAGNOSIS — I2510 Atherosclerotic heart disease of native coronary artery without angina pectoris: Secondary | ICD-10-CM | POA: Diagnosis not present

## 2021-07-20 DIAGNOSIS — L97511 Non-pressure chronic ulcer of other part of right foot limited to breakdown of skin: Secondary | ICD-10-CM | POA: Diagnosis not present

## 2021-07-20 DIAGNOSIS — E1169 Type 2 diabetes mellitus with other specified complication: Secondary | ICD-10-CM | POA: Diagnosis not present

## 2021-07-20 DIAGNOSIS — C169 Malignant neoplasm of stomach, unspecified: Secondary | ICD-10-CM | POA: Diagnosis not present

## 2021-07-20 DIAGNOSIS — D500 Iron deficiency anemia secondary to blood loss (chronic): Secondary | ICD-10-CM | POA: Diagnosis not present

## 2021-07-20 DIAGNOSIS — E7800 Pure hypercholesterolemia, unspecified: Secondary | ICD-10-CM | POA: Diagnosis not present

## 2021-07-27 NOTE — Progress Notes (Signed)
Remote ICD transmission.   

## 2021-07-28 ENCOUNTER — Other Ambulatory Visit: Payer: Self-pay

## 2021-07-28 ENCOUNTER — Inpatient Hospital Stay: Payer: Medicare HMO | Attending: Hematology

## 2021-07-28 DIAGNOSIS — Z79899 Other long term (current) drug therapy: Secondary | ICD-10-CM | POA: Diagnosis not present

## 2021-07-28 DIAGNOSIS — Z95828 Presence of other vascular implants and grafts: Secondary | ICD-10-CM

## 2021-07-28 DIAGNOSIS — D500 Iron deficiency anemia secondary to blood loss (chronic): Secondary | ICD-10-CM

## 2021-07-28 DIAGNOSIS — C163 Malignant neoplasm of pyloric antrum: Secondary | ICD-10-CM | POA: Diagnosis not present

## 2021-07-28 LAB — FERRITIN: Ferritin: 19 ng/mL — ABNORMAL LOW (ref 24–336)

## 2021-07-28 LAB — CBC WITH DIFFERENTIAL/PLATELET
Abs Immature Granulocytes: 0.01 10*3/uL (ref 0.00–0.07)
Basophils Absolute: 0 10*3/uL (ref 0.0–0.1)
Basophils Relative: 0 %
Eosinophils Absolute: 0.1 10*3/uL (ref 0.0–0.5)
Eosinophils Relative: 1 %
HCT: 36.4 % — ABNORMAL LOW (ref 39.0–52.0)
Hemoglobin: 12.2 g/dL — ABNORMAL LOW (ref 13.0–17.0)
Immature Granulocytes: 0 %
Lymphocytes Relative: 19 %
Lymphs Abs: 0.9 10*3/uL (ref 0.7–4.0)
MCH: 31.4 pg (ref 26.0–34.0)
MCHC: 33.5 g/dL (ref 30.0–36.0)
MCV: 93.8 fL (ref 80.0–100.0)
Monocytes Absolute: 0.4 10*3/uL (ref 0.1–1.0)
Monocytes Relative: 9 %
Neutro Abs: 3.3 10*3/uL (ref 1.7–7.7)
Neutrophils Relative %: 71 %
Platelets: 185 10*3/uL (ref 150–400)
RBC: 3.88 MIL/uL — ABNORMAL LOW (ref 4.22–5.81)
RDW: 12.7 % (ref 11.5–15.5)
WBC: 4.7 10*3/uL (ref 4.0–10.5)
nRBC: 0 % (ref 0.0–0.2)

## 2021-07-28 LAB — COMPREHENSIVE METABOLIC PANEL
ALT: 22 U/L (ref 0–44)
AST: 20 U/L (ref 15–41)
Albumin: 4.5 g/dL (ref 3.5–5.0)
Alkaline Phosphatase: 67 U/L (ref 38–126)
Anion gap: 7 (ref 5–15)
BUN: 24 mg/dL — ABNORMAL HIGH (ref 8–23)
CO2: 26 mmol/L (ref 22–32)
Calcium: 9.8 mg/dL (ref 8.9–10.3)
Chloride: 106 mmol/L (ref 98–111)
Creatinine, Ser: 1.12 mg/dL (ref 0.61–1.24)
GFR, Estimated: >60 mL/min (ref >60)
Glucose, Bld: 100 mg/dL — ABNORMAL HIGH (ref 70–99)
Potassium: 3.9 mmol/L (ref 3.5–5.1)
Sodium: 139 mmol/L (ref 135–145)
Total Bilirubin: 0.8 mg/dL (ref 0.3–1.2)
Total Protein: 7.6 g/dL (ref 6.5–8.1)

## 2021-07-28 LAB — TSH: TSH: 2.108 u[IU]/mL (ref 0.350–4.500)

## 2021-07-28 MED ORDER — HEPARIN SOD (PORK) LOCK FLUSH 100 UNIT/ML IV SOLN
500.0000 [IU] | Freq: Once | INTRAVENOUS | Status: AC
  Administered 2021-07-28: 500 [IU]

## 2021-07-28 MED ORDER — SODIUM CHLORIDE 0.9% FLUSH
10.0000 mL | Freq: Once | INTRAVENOUS | Status: AC
  Administered 2021-07-28: 10 mL

## 2021-07-29 ENCOUNTER — Other Ambulatory Visit: Payer: Self-pay | Admitting: Hematology

## 2021-07-29 LAB — T4: T4, Total: 9.1 ug/dL (ref 4.5–12.0)

## 2021-08-05 ENCOUNTER — Other Ambulatory Visit: Payer: Self-pay | Admitting: Hematology

## 2021-08-16 ENCOUNTER — Telehealth: Payer: Self-pay | Admitting: Cardiovascular Disease

## 2021-08-16 NOTE — Telephone Encounter (Signed)
Herbert Deaner, of PT at [facility] called [facility] regarding patient Jon Williams intermittent chest tightness overnight and briefly this morning.    Patient felt some intermittent chest tightness this morning while showering.  Per Mr. Hoffman, no lower extremity edema present, lung sounds clear to auscultation, no weight gain, but little urine output overnight.  Pt on lasix and given another dose with morning meds.   Mr. Heber [place] stated that patient is now asymptomatic after taking all of his morning medications at 730 am; pt on Sotalol, Toprol XL 25 Mg, and chest tightness has gone away.   I asked Mr. Hoffman if any other s/s of CP or SOB present, he stated NO.    Mr. Heber [place] told that if chest tightness or any s/s appear at any time again today, that he needs to see provider in ER. ( Pt still asymptomatic at end of telephone call. )   I recommended that Pt Mr. Pavek see his PCP or a provider today regarding decreased urine output s/p second lasix dose, per assessing the length of time w/o voiding.

## 2021-08-16 NOTE — Telephone Encounter (Signed)
Pt c/o of Chest Pain: STAT if CP now or developed within 24 hours  1. Are you having CP right now?   No  2. Are you experiencing any other symptoms (ex. SOB, nausea, vomiting, sweating)?   No  3. How long have you been experiencing CP?   24 hours  4. Is your CP continuous or coming and going?  Coming and going  5. Have you taken Nitroglycerin?  No ?   Caller noted the last time he had this issue he was hospitalized.  Caller stated the pain is at level 5/10 when it occurs.  Since yesterday afternoon he is not urinating as usual.  He was given an Lasik today.

## 2021-08-16 NOTE — Telephone Encounter (Signed)
Left message for pt to call  Left message for jim, reaching out to the patient

## 2021-08-16 NOTE — Telephone Encounter (Signed)
Contacted Pt spouse Jon Williams...  Verified Pt is home care with Jon Williams; Jon Williams has meds set up for husband.  I was f/u with lasix concern Jon Williams of PT made me aware of.  Discovered:  Pt spouse giving him morning and evening Lasix, since May Jon Williams stated.  Pt taking more than ordered.    Per order by Christen Bame NP, pt is to take Lasix 1/2 tablet ( 20 mg ) PO DAILY;  May take another 1/2 tablet PO  DAILY for fluid build-up.  Jon Williams was educated on what the order truly is, and to follow the order as written.    Jon Williams understood Lasix instructions after further explanation.  She was also told NP Swinyer would be made aware.    I will forward this communication with the improper use details of the Lasix to Christen Bame, NP.

## 2021-08-16 NOTE — Telephone Encounter (Signed)
Update:  During note entry, Pt voided, per Mr. Hoffman of PT, no signs of fluid build up, lower extremity edema, and no s/s of chest tightness since meds this morning.  Staff at [facility] will get Mr. Budai to ER if chest tightness returns today.

## 2021-08-17 NOTE — Telephone Encounter (Signed)
Noted, thank you for the update

## 2021-08-22 DIAGNOSIS — R21 Rash and other nonspecific skin eruption: Secondary | ICD-10-CM | POA: Diagnosis not present

## 2021-08-24 DIAGNOSIS — Z8546 Personal history of malignant neoplasm of prostate: Secondary | ICD-10-CM | POA: Diagnosis not present

## 2021-08-31 DIAGNOSIS — N400 Enlarged prostate without lower urinary tract symptoms: Secondary | ICD-10-CM | POA: Diagnosis not present

## 2021-09-04 ENCOUNTER — Other Ambulatory Visit: Payer: Self-pay

## 2021-09-11 ENCOUNTER — Other Ambulatory Visit: Payer: Self-pay

## 2021-09-11 ENCOUNTER — Inpatient Hospital Stay: Payer: Medicare HMO

## 2021-09-11 ENCOUNTER — Ambulatory Visit (HOSPITAL_COMMUNITY)
Admission: RE | Admit: 2021-09-11 | Discharge: 2021-09-11 | Disposition: A | Discharge status: Home / Self Care | Payer: Medicare HMO | Source: Ambulatory Visit | Attending: Hematology | Admitting: Hematology

## 2021-09-11 DIAGNOSIS — I700 Atherosclerosis of aorta: Secondary | ICD-10-CM | POA: Diagnosis not present

## 2021-09-11 DIAGNOSIS — K449 Diaphragmatic hernia without obstruction or gangrene: Secondary | ICD-10-CM | POA: Diagnosis not present

## 2021-09-11 DIAGNOSIS — E669 Obesity, unspecified: Secondary | ICD-10-CM | POA: Diagnosis present

## 2021-09-11 DIAGNOSIS — E785 Hyperlipidemia, unspecified: Secondary | ICD-10-CM | POA: Diagnosis not present

## 2021-09-11 DIAGNOSIS — C163 Malignant neoplasm of pyloric antrum: Secondary | ICD-10-CM | POA: Insufficient documentation

## 2021-09-11 DIAGNOSIS — Z79891 Long term (current) use of opiate analgesic: Secondary | ICD-10-CM | POA: Diagnosis not present

## 2021-09-11 DIAGNOSIS — D500 Iron deficiency anemia secondary to blood loss (chronic): Secondary | ICD-10-CM

## 2021-09-11 DIAGNOSIS — I509 Heart failure, unspecified: Secondary | ICD-10-CM | POA: Diagnosis present

## 2021-09-11 DIAGNOSIS — Z95810 Presence of automatic (implantable) cardiac defibrillator: Secondary | ICD-10-CM | POA: Diagnosis not present

## 2021-09-11 DIAGNOSIS — I10 Essential (primary) hypertension: Secondary | ICD-10-CM | POA: Diagnosis not present

## 2021-09-11 DIAGNOSIS — R0602 Shortness of breath: Secondary | ICD-10-CM | POA: Diagnosis not present

## 2021-09-11 DIAGNOSIS — I5041 Acute combined systolic (congestive) and diastolic (congestive) heart failure: Secondary | ICD-10-CM | POA: Diagnosis not present

## 2021-09-11 DIAGNOSIS — I5043 Acute on chronic combined systolic (congestive) and diastolic (congestive) heart failure: Secondary | ICD-10-CM | POA: Diagnosis not present

## 2021-09-11 DIAGNOSIS — Z96651 Presence of right artificial knee joint: Secondary | ICD-10-CM | POA: Diagnosis present

## 2021-09-11 DIAGNOSIS — I2510 Atherosclerotic heart disease of native coronary artery without angina pectoris: Secondary | ICD-10-CM | POA: Diagnosis not present

## 2021-09-11 DIAGNOSIS — C61 Malignant neoplasm of prostate: Secondary | ICD-10-CM | POA: Diagnosis not present

## 2021-09-11 DIAGNOSIS — I110 Hypertensive heart disease with heart failure: Secondary | ICD-10-CM | POA: Diagnosis not present

## 2021-09-11 DIAGNOSIS — C169 Malignant neoplasm of stomach, unspecified: Secondary | ICD-10-CM | POA: Diagnosis not present

## 2021-09-11 DIAGNOSIS — J90 Pleural effusion, not elsewhere classified: Secondary | ICD-10-CM | POA: Diagnosis not present

## 2021-09-11 DIAGNOSIS — C160 Malignant neoplasm of cardia: Secondary | ICD-10-CM | POA: Insufficient documentation

## 2021-09-11 DIAGNOSIS — Z79899 Other long term (current) drug therapy: Secondary | ICD-10-CM | POA: Diagnosis not present

## 2021-09-11 DIAGNOSIS — Z85028 Personal history of other malignant neoplasm of stomach: Secondary | ICD-10-CM | POA: Diagnosis not present

## 2021-09-11 DIAGNOSIS — Z882 Allergy status to sulfonamides status: Secondary | ICD-10-CM | POA: Diagnosis not present

## 2021-09-11 DIAGNOSIS — Z66 Do not resuscitate: Secondary | ICD-10-CM | POA: Diagnosis not present

## 2021-09-11 DIAGNOSIS — Z95828 Presence of other vascular implants and grafts: Secondary | ICD-10-CM | POA: Diagnosis not present

## 2021-09-11 DIAGNOSIS — R079 Chest pain, unspecified: Secondary | ICD-10-CM | POA: Diagnosis not present

## 2021-09-11 DIAGNOSIS — D638 Anemia in other chronic diseases classified elsewhere: Secondary | ICD-10-CM | POA: Diagnosis not present

## 2021-09-11 DIAGNOSIS — I428 Other cardiomyopathies: Secondary | ICD-10-CM | POA: Diagnosis not present

## 2021-09-11 DIAGNOSIS — Z7901 Long term (current) use of anticoagulants: Secondary | ICD-10-CM | POA: Diagnosis not present

## 2021-09-11 DIAGNOSIS — Z833 Family history of diabetes mellitus: Secondary | ICD-10-CM | POA: Diagnosis not present

## 2021-09-11 DIAGNOSIS — R0789 Other chest pain: Secondary | ICD-10-CM | POA: Diagnosis not present

## 2021-09-11 DIAGNOSIS — Z7984 Long term (current) use of oral hypoglycemic drugs: Secondary | ICD-10-CM | POA: Diagnosis not present

## 2021-09-11 DIAGNOSIS — E440 Moderate protein-calorie malnutrition: Secondary | ICD-10-CM | POA: Diagnosis not present

## 2021-09-11 DIAGNOSIS — K429 Umbilical hernia without obstruction or gangrene: Secondary | ICD-10-CM | POA: Diagnosis not present

## 2021-09-11 DIAGNOSIS — Z886 Allergy status to analgesic agent status: Secondary | ICD-10-CM | POA: Diagnosis not present

## 2021-09-11 DIAGNOSIS — I4819 Other persistent atrial fibrillation: Secondary | ICD-10-CM | POA: Diagnosis not present

## 2021-09-11 DIAGNOSIS — E119 Type 2 diabetes mellitus without complications: Secondary | ICD-10-CM | POA: Diagnosis present

## 2021-09-11 DIAGNOSIS — I2729 Other secondary pulmonary hypertension: Secondary | ICD-10-CM | POA: Diagnosis not present

## 2021-09-11 DIAGNOSIS — I081 Rheumatic disorders of both mitral and tricuspid valves: Secondary | ICD-10-CM | POA: Diagnosis present

## 2021-09-11 DIAGNOSIS — K4090 Unilateral inguinal hernia, without obstruction or gangrene, not specified as recurrent: Secondary | ICD-10-CM | POA: Diagnosis not present

## 2021-09-11 LAB — CBC WITH DIFFERENTIAL/PLATELET
Abs Immature Granulocytes: 0.02 10*3/uL (ref 0.00–0.07)
Basophils Absolute: 0 10*3/uL (ref 0.0–0.1)
Basophils Relative: 0 %
Eosinophils Absolute: 0.1 10*3/uL (ref 0.0–0.5)
Eosinophils Relative: 1 %
HCT: 35 % — ABNORMAL LOW (ref 39.0–52.0)
Hemoglobin: 11.8 g/dL — ABNORMAL LOW (ref 13.0–17.0)
Immature Granulocytes: 0 %
Lymphocytes Relative: 15 %
Lymphs Abs: 0.9 10*3/uL (ref 0.7–4.0)
MCH: 31.6 pg (ref 26.0–34.0)
MCHC: 33.7 g/dL (ref 30.0–36.0)
MCV: 93.8 fL (ref 80.0–100.0)
Monocytes Absolute: 0.5 10*3/uL (ref 0.1–1.0)
Monocytes Relative: 8 %
Neutro Abs: 4.9 10*3/uL (ref 1.7–7.7)
Neutrophils Relative %: 76 %
Platelets: 163 10*3/uL (ref 150–400)
RBC: 3.73 MIL/uL — ABNORMAL LOW (ref 4.22–5.81)
RDW: 15.1 % (ref 11.5–15.5)
WBC: 6.5 10*3/uL (ref 4.0–10.5)
nRBC: 0 % (ref 0.0–0.2)

## 2021-09-11 LAB — FERRITIN: Ferritin: 25 ng/mL (ref 24–336)

## 2021-09-11 LAB — COMPREHENSIVE METABOLIC PANEL
ALT: 35 U/L (ref 0–44)
AST: 22 U/L (ref 15–41)
Albumin: 4.3 g/dL (ref 3.5–5.0)
Alkaline Phosphatase: 56 U/L (ref 38–126)
Anion gap: 5 (ref 5–15)
BUN: 21 mg/dL (ref 8–23)
CO2: 27 mmol/L (ref 22–32)
Calcium: 9 mg/dL (ref 8.9–10.3)
Chloride: 110 mmol/L (ref 98–111)
Creatinine, Ser: 1.04 mg/dL (ref 0.61–1.24)
GFR, Estimated: >60 mL/min (ref >60)
Glucose, Bld: 114 mg/dL — ABNORMAL HIGH (ref 70–99)
Potassium: 3.8 mmol/L (ref 3.5–5.1)
Sodium: 142 mmol/L (ref 135–145)
Total Bilirubin: 1.3 mg/dL — ABNORMAL HIGH (ref 0.3–1.2)
Total Protein: 6.8 g/dL (ref 6.5–8.1)

## 2021-09-11 MED ORDER — HEPARIN SOD (PORK) LOCK FLUSH 100 UNIT/ML IV SOLN: 500.0000 [IU] | Freq: Once | INTRAVENOUS | Status: DC

## 2021-09-11 MED ORDER — SODIUM CHLORIDE 0.9% FLUSH
10.0000 mL | Freq: Once | INTRAVENOUS | Status: AC
  Administered 2021-09-11: 10 mL

## 2021-09-11 MED ORDER — IOHEXOL 300 MG/ML  SOLN
100.0000 mL | Freq: Once | INTRAMUSCULAR | Status: AC | PRN
  Administered 2021-09-11: 100 mL via INTRAVENOUS

## 2021-09-11 MED ORDER — SODIUM CHLORIDE (PF) 0.9 % IJ SOLN
INTRAMUSCULAR | Status: AC
  Filled 2021-09-11: qty 50

## 2021-09-12 ENCOUNTER — Other Ambulatory Visit: Payer: Self-pay

## 2021-09-13 ENCOUNTER — Emergency Department (HOSPITAL_COMMUNITY): Payer: Medicare HMO

## 2021-09-13 ENCOUNTER — Inpatient Hospital Stay (HOSPITAL_COMMUNITY): Payer: Medicare HMO

## 2021-09-13 ENCOUNTER — Encounter (HOSPITAL_COMMUNITY): Payer: Self-pay | Admitting: Internal Medicine

## 2021-09-13 ENCOUNTER — Other Ambulatory Visit: Payer: Self-pay

## 2021-09-13 ENCOUNTER — Other Ambulatory Visit (HOSPITAL_COMMUNITY): Payer: Self-pay

## 2021-09-13 ENCOUNTER — Encounter: Payer: Self-pay | Admitting: Hematology

## 2021-09-13 ENCOUNTER — Inpatient Hospital Stay (HOSPITAL_COMMUNITY)
Admission: EM | Admit: 2021-09-13 | Discharge: 2021-09-15 | DRG: 291 | Disposition: A | Discharge status: Home / Self Care | Payer: Medicare HMO | Attending: Internal Medicine | Admitting: Internal Medicine

## 2021-09-13 DIAGNOSIS — I4819 Other persistent atrial fibrillation: Secondary | ICD-10-CM | POA: Diagnosis not present

## 2021-09-13 DIAGNOSIS — I2729 Other secondary pulmonary hypertension: Secondary | ICD-10-CM | POA: Diagnosis present

## 2021-09-13 DIAGNOSIS — Z95828 Presence of other vascular implants and grafts: Secondary | ICD-10-CM

## 2021-09-13 DIAGNOSIS — I110 Hypertensive heart disease with heart failure: Secondary | ICD-10-CM | POA: Diagnosis present

## 2021-09-13 DIAGNOSIS — I428 Other cardiomyopathies: Secondary | ICD-10-CM | POA: Diagnosis present

## 2021-09-13 DIAGNOSIS — E785 Hyperlipidemia, unspecified: Secondary | ICD-10-CM | POA: Diagnosis present

## 2021-09-13 DIAGNOSIS — C169 Malignant neoplasm of stomach, unspecified: Secondary | ICD-10-CM | POA: Diagnosis present

## 2021-09-13 DIAGNOSIS — R0600 Dyspnea, unspecified: Secondary | ICD-10-CM

## 2021-09-13 DIAGNOSIS — Z79891 Long term (current) use of opiate analgesic: Secondary | ICD-10-CM | POA: Diagnosis not present

## 2021-09-13 DIAGNOSIS — I10 Essential (primary) hypertension: Secondary | ICD-10-CM | POA: Diagnosis present

## 2021-09-13 DIAGNOSIS — I2510 Atherosclerotic heart disease of native coronary artery without angina pectoris: Secondary | ICD-10-CM | POA: Diagnosis present

## 2021-09-13 DIAGNOSIS — Z833 Family history of diabetes mellitus: Secondary | ICD-10-CM | POA: Diagnosis not present

## 2021-09-13 DIAGNOSIS — C61 Malignant neoplasm of prostate: Secondary | ICD-10-CM | POA: Diagnosis present

## 2021-09-13 DIAGNOSIS — I5043 Acute on chronic combined systolic (congestive) and diastolic (congestive) heart failure: Secondary | ICD-10-CM | POA: Diagnosis present

## 2021-09-13 DIAGNOSIS — Z886 Allergy status to analgesic agent status: Secondary | ICD-10-CM

## 2021-09-13 DIAGNOSIS — R079 Chest pain, unspecified: Principal | ICD-10-CM

## 2021-09-13 DIAGNOSIS — Z85028 Personal history of other malignant neoplasm of stomach: Secondary | ICD-10-CM

## 2021-09-13 DIAGNOSIS — Z7984 Long term (current) use of oral hypoglycemic drugs: Secondary | ICD-10-CM | POA: Diagnosis not present

## 2021-09-13 DIAGNOSIS — Z66 Do not resuscitate: Secondary | ICD-10-CM | POA: Diagnosis present

## 2021-09-13 DIAGNOSIS — E119 Type 2 diabetes mellitus without complications: Secondary | ICD-10-CM | POA: Diagnosis present

## 2021-09-13 DIAGNOSIS — E440 Moderate protein-calorie malnutrition: Secondary | ICD-10-CM | POA: Diagnosis present

## 2021-09-13 DIAGNOSIS — Z96651 Presence of right artificial knee joint: Secondary | ICD-10-CM | POA: Diagnosis present

## 2021-09-13 DIAGNOSIS — Z95810 Presence of automatic (implantable) cardiac defibrillator: Secondary | ICD-10-CM | POA: Diagnosis present

## 2021-09-13 DIAGNOSIS — Z79899 Other long term (current) drug therapy: Secondary | ICD-10-CM

## 2021-09-13 DIAGNOSIS — D638 Anemia in other chronic diseases classified elsewhere: Secondary | ICD-10-CM | POA: Diagnosis present

## 2021-09-13 DIAGNOSIS — Z882 Allergy status to sulfonamides status: Secondary | ICD-10-CM | POA: Diagnosis not present

## 2021-09-13 DIAGNOSIS — I081 Rheumatic disorders of both mitral and tricuspid valves: Secondary | ICD-10-CM | POA: Diagnosis present

## 2021-09-13 DIAGNOSIS — Z7901 Long term (current) use of anticoagulants: Secondary | ICD-10-CM

## 2021-09-13 DIAGNOSIS — I5041 Acute combined systolic (congestive) and diastolic (congestive) heart failure: Secondary | ICD-10-CM

## 2021-09-13 DIAGNOSIS — E669 Obesity, unspecified: Secondary | ICD-10-CM | POA: Diagnosis present

## 2021-09-13 DIAGNOSIS — R0789 Other chest pain: Secondary | ICD-10-CM | POA: Diagnosis not present

## 2021-09-13 DIAGNOSIS — I509 Heart failure, unspecified: Secondary | ICD-10-CM | POA: Diagnosis present

## 2021-09-13 DIAGNOSIS — Z8249 Family history of ischemic heart disease and other diseases of the circulatory system: Secondary | ICD-10-CM

## 2021-09-13 DIAGNOSIS — Z6830 Body mass index (BMI) 30.0-30.9, adult: Secondary | ICD-10-CM

## 2021-09-13 DIAGNOSIS — C163 Malignant neoplasm of pyloric antrum: Secondary | ICD-10-CM

## 2021-09-13 DIAGNOSIS — Z9221 Personal history of antineoplastic chemotherapy: Secondary | ICD-10-CM

## 2021-09-13 DIAGNOSIS — R0602 Shortness of breath: Secondary | ICD-10-CM | POA: Diagnosis not present

## 2021-09-13 HISTORY — DX: Malignant neoplasm of stomach, unspecified: C16.9

## 2021-09-13 LAB — BRAIN NATRIURETIC PEPTIDE: B Natriuretic Peptide: 1233.3 pg/mL — ABNORMAL HIGH (ref 0.0–100.0)

## 2021-09-13 LAB — CBC
HCT: 36.1 % — ABNORMAL LOW (ref 39.0–52.0)
Hemoglobin: 11.9 g/dL — ABNORMAL LOW (ref 13.0–17.0)
MCH: 31.8 pg (ref 26.0–34.0)
MCHC: 33 g/dL (ref 30.0–36.0)
MCV: 96.5 fL (ref 80.0–100.0)
Platelets: 164 10*3/uL (ref 150–400)
RBC: 3.74 MIL/uL — ABNORMAL LOW (ref 4.22–5.81)
RDW: 15.2 % (ref 11.5–15.5)
WBC: 5.4 10*3/uL (ref 4.0–10.5)
nRBC: 0 % (ref 0.0–0.2)

## 2021-09-13 LAB — TROPONIN I (HIGH SENSITIVITY)
Troponin I (High Sensitivity): 5 ng/L (ref <18)
Troponin I (High Sensitivity): 5 ng/L (ref <18)

## 2021-09-13 LAB — ECHOCARDIOGRAM COMPLETE
AR max vel: 2.29 cm2
AV Peak grad: 5 mmHg
Ao pk vel: 1.12 m/s
Area-P 1/2: 6.02 cm2
Height: 66 in
MV M vel: 3.75 m/s
MV Peak grad: 56.1 mmHg
S' Lateral: 4.37 cm
Single Plane A4C EF: 24.8 %
Weight: 3152 oz

## 2021-09-13 LAB — MAGNESIUM: Magnesium: 1.9 mg/dL (ref 1.7–2.4)

## 2021-09-13 LAB — BASIC METABOLIC PANEL
Anion gap: 7 (ref 5–15)
BUN: 14 mg/dL (ref 8–23)
CO2: 22 mmol/L (ref 22–32)
Calcium: 9 mg/dL (ref 8.9–10.3)
Chloride: 110 mmol/L (ref 98–111)
Creatinine, Ser: 0.99 mg/dL (ref 0.61–1.24)
GFR, Estimated: >60 mL/min (ref >60)
Glucose, Bld: 120 mg/dL — ABNORMAL HIGH (ref 70–99)
Potassium: 3.6 mmol/L (ref 3.5–5.1)
Sodium: 139 mmol/L (ref 135–145)

## 2021-09-13 LAB — HEPATIC FUNCTION PANEL
ALT: 55 U/L — ABNORMAL HIGH (ref 0–44)
AST: 41 U/L (ref 15–41)
Albumin: 3.9 g/dL (ref 3.5–5.0)
Alkaline Phosphatase: 62 U/L (ref 38–126)
Bilirubin, Direct: 0.3 mg/dL — ABNORMAL HIGH (ref 0.0–0.2)
Indirect Bilirubin: 0.7 mg/dL (ref 0.3–0.9)
Total Bilirubin: 1 mg/dL (ref 0.3–1.2)
Total Protein: 6.4 g/dL — ABNORMAL LOW (ref 6.5–8.1)

## 2021-09-13 LAB — CBG MONITORING, ED: Glucose-Capillary: 100 mg/dL — ABNORMAL HIGH (ref 70–99)

## 2021-09-13 LAB — GLUCOSE, CAPILLARY
Glucose-Capillary: 107 mg/dL — ABNORMAL HIGH (ref 70–99)
Glucose-Capillary: 134 mg/dL — ABNORMAL HIGH (ref 70–99)

## 2021-09-13 MED ORDER — ACETAMINOPHEN 650 MG RE SUPP: 650.0000 mg | Freq: Four times a day (QID) | RECTAL | Status: DC | PRN

## 2021-09-13 MED ORDER — INSULIN ASPART 100 UNIT/ML IJ SOLN
0.0000 [IU] | Freq: Three times a day (TID) | INTRAMUSCULAR | Status: DC
  Administered 2021-09-14: 5 [IU] via SUBCUTANEOUS
  Administered 2021-09-14: 2 [IU] via SUBCUTANEOUS
  Administered 2021-09-15: 3 [IU] via SUBCUTANEOUS

## 2021-09-13 MED ORDER — PANTOPRAZOLE SODIUM 40 MG PO TBEC
40.0000 mg | DELAYED_RELEASE_TABLET | Freq: Two times a day (BID) | ORAL | Status: DC
  Administered 2021-09-13 – 2021-09-15 (×5): 40 mg via ORAL
  Filled 2021-09-13 (×5): qty 1

## 2021-09-13 MED ORDER — HYDRALAZINE HCL 20 MG/ML IJ SOLN: 5.0000 mg | INTRAMUSCULAR | Status: DC | PRN

## 2021-09-13 MED ORDER — ATORVASTATIN CALCIUM 40 MG PO TABS
40.0000 mg | ORAL_TABLET | Freq: Every day | ORAL | Status: DC
  Administered 2021-09-13 – 2021-09-15 (×3): 40 mg via ORAL
  Filled 2021-09-13 (×3): qty 1

## 2021-09-13 MED ORDER — ENOXAPARIN SODIUM 40 MG/0.4ML IJ SOSY
40.0000 mg | PREFILLED_SYRINGE | INTRAMUSCULAR | Status: DC
Start: 2021-09-13 — End: 2021-09-14
  Administered 2021-09-13 – 2021-09-14 (×2): 40 mg via SUBCUTANEOUS
  Filled 2021-09-13 (×2): qty 0.4

## 2021-09-13 MED ORDER — NITROGLYCERIN 0.4 MG SL SUBL
0.4000 mg | SUBLINGUAL_TABLET | SUBLINGUAL | Status: DC | PRN
Start: 2021-09-13 — End: 2021-09-15
  Administered 2021-09-13 (×2): 0.4 mg via SUBLINGUAL
  Filled 2021-09-13: qty 1

## 2021-09-13 MED ORDER — OXYCODONE HCL 5 MG PO TABS: 5.0000 mg | ORAL_TABLET | ORAL | Status: DC | PRN

## 2021-09-13 MED ORDER — FUROSEMIDE 10 MG/ML IJ SOLN
40.0000 mg | Freq: Two times a day (BID) | INTRAMUSCULAR | Status: DC
Start: 2021-09-13 — End: 2021-09-15
  Administered 2021-09-13 – 2021-09-15 (×4): 40 mg via INTRAVENOUS
  Filled 2021-09-13 (×4): qty 4

## 2021-09-13 MED ORDER — BISACODYL 5 MG PO TBEC: 5.0000 mg | DELAYED_RELEASE_TABLET | Freq: Every day | ORAL | Status: DC | PRN

## 2021-09-13 MED ORDER — MAGNESIUM SULFATE 2 GM/50ML IV SOLN
2.0000 g | Freq: Once | INTRAVENOUS | Status: AC
  Administered 2021-09-13: 2 g via INTRAVENOUS
  Filled 2021-09-13: qty 50

## 2021-09-13 MED ORDER — POTASSIUM CHLORIDE CRYS ER 20 MEQ PO TBCR
60.0000 meq | EXTENDED_RELEASE_TABLET | Freq: Once | ORAL | Status: AC
  Administered 2021-09-13: 60 meq via ORAL
  Filled 2021-09-13: qty 3

## 2021-09-13 MED ORDER — IOHEXOL 350 MG/ML SOLN
100.0000 mL | Freq: Once | INTRAVENOUS | Status: AC | PRN
  Administered 2021-09-13: 75 mL via INTRAVENOUS

## 2021-09-13 MED ORDER — TRAZODONE HCL 50 MG PO TABS: 25.0000 mg | ORAL_TABLET | Freq: Every evening | ORAL | Status: DC | PRN

## 2021-09-13 MED ORDER — DOCUSATE SODIUM 100 MG PO CAPS
100.0000 mg | ORAL_CAPSULE | Freq: Two times a day (BID) | ORAL | Status: DC
  Administered 2021-09-13 – 2021-09-14 (×2): 100 mg via ORAL
  Filled 2021-09-13 (×5): qty 1

## 2021-09-13 MED ORDER — SACUBITRIL-VALSARTAN 24-26 MG PO TABS
1.0000 | ORAL_TABLET | Freq: Two times a day (BID) | ORAL | Status: DC
  Administered 2021-09-13 – 2021-09-15 (×5): 1 via ORAL
  Filled 2021-09-13 (×5): qty 1

## 2021-09-13 MED ORDER — POLYETHYLENE GLYCOL 3350 17 G PO PACK: 17.0000 g | PACK | Freq: Every day | ORAL | Status: DC | PRN

## 2021-09-13 MED ORDER — ONDANSETRON HCL 4 MG/2ML IJ SOLN: 4.0000 mg | Freq: Four times a day (QID) | INTRAMUSCULAR | Status: DC | PRN

## 2021-09-13 MED ORDER — SODIUM CHLORIDE 0.9% FLUSH
3.0000 mL | Freq: Two times a day (BID) | INTRAVENOUS | Status: DC
  Administered 2021-09-13 – 2021-09-15 (×5): 3 mL via INTRAVENOUS

## 2021-09-13 MED ORDER — ACETAMINOPHEN 325 MG PO TABS: 650.0000 mg | ORAL_TABLET | Freq: Four times a day (QID) | ORAL | Status: DC | PRN

## 2021-09-13 MED ORDER — METOPROLOL SUCCINATE ER 25 MG PO TB24
25.0000 mg | ORAL_TABLET | Freq: Every day | ORAL | Status: DC
  Administered 2021-09-13 – 2021-09-15 (×3): 25 mg via ORAL
  Filled 2021-09-13 (×3): qty 1

## 2021-09-13 MED ORDER — ONDANSETRON HCL 4 MG PO TABS: 4.0000 mg | ORAL_TABLET | Freq: Four times a day (QID) | ORAL | Status: DC | PRN

## 2021-09-13 MED ORDER — MORPHINE SULFATE (PF) 2 MG/ML IV SOLN: 2.0000 mg | INTRAVENOUS | Status: DC | PRN

## 2021-09-13 MED ORDER — INSULIN ASPART 100 UNIT/ML IJ SOLN: 0.0000 [IU] | Freq: Every day | INTRAMUSCULAR | Status: DC

## 2021-09-13 MED ORDER — GABAPENTIN 100 MG PO CAPS
100.0000 mg | ORAL_CAPSULE | Freq: Two times a day (BID) | ORAL | Status: DC
  Administered 2021-09-13 – 2021-09-15 (×5): 100 mg via ORAL
  Filled 2021-09-13 (×5): qty 1

## 2021-09-13 MED ORDER — SOTALOL HCL 120 MG PO TABS
120.0000 mg | ORAL_TABLET | Freq: Two times a day (BID) | ORAL | Status: DC
  Administered 2021-09-13 – 2021-09-15 (×5): 120 mg via ORAL
  Filled 2021-09-13 (×6): qty 1

## 2021-09-13 MED ORDER — FUROSEMIDE 10 MG/ML IJ SOLN
40.0000 mg | Freq: Once | INTRAMUSCULAR | Status: AC
Start: 2021-09-13 — End: 2021-09-13
  Administered 2021-09-13: 40 mg via INTRAVENOUS
  Filled 2021-09-13: qty 4

## 2021-09-13 NOTE — ED Notes (Signed)
Pt to CT

## 2021-09-13 NOTE — Progress Notes (Signed)
Physical Therapy Evaluation Patient Details Name: Jon Williams MRN: [id number] DOB: 1950-11-29 Today's Date: 09/13/2021  History of Present Illness  71 yo male with onset of SOB was admitted on 8/2 for managment of his symptoms.  Was noted to have CHF, cleared for PE but has mild pulm edema.  Had a recent bout of chemo for his gastric tumor, then home therapy for generalized weakness.  PMHx:  ICD, CAD, a-fib, gastric CA, GI bleed, CHF, hypotension, LVEF 20-25%,  Clinical Impression  Pt was seen for mobility with RW, but typically uses Rollator.  He is accompanied by his wife, who is quite familiar with all his history and care needs.  Will recommend PT see as inpt due to his medical challenges, and had just completed HHPT a week before this readmission.  Follow for acute PT goals and should not need follow up PT or further equipment afterward.  No recent falls have occurred, and did take time to instruct pt on ankle and hip ROM to avoid fall risk.         Recommendations for follow up therapy are one component of a multi-disciplinary discharge planning process, led by the attending physician.  Recommendations may be updated based on patient status, additional functional criteria and insurance authorization.  Follow Up Recommendations No PT follow up      Assistance Recommended at Discharge Set up Supervision/Assistance  Patient can return home with the following  A little help with walking and/or transfers;Assist for transportation;Help with stairs or ramp for entrance    Equipment Recommendations None recommended by PT  Recommendations for Other Services       Functional Status Assessment Patient has had a recent decline in their functional status and demonstrates the ability to make significant improvements in function in a reasonable and predictable amount of time.     Precautions / Restrictions Precautions Precautions: Fall Precaution Comments: monitor O2 sats and  HR Restrictions Weight Bearing Restrictions: No      Mobility  Bed Mobility Overal bed mobility: Modified Independent             General bed mobility comments: I even on limited width of gurney    Transfers Overall transfer level: Modified independent Equipment used: Rolling Jon (2 wheels)               General transfer comment: supervised STS for safety    Ambulation/Gait Ambulation/Gait assistance: Min guard Gait Distance (Feet): 120 Feet Assistive device: Rolling Jon (2 wheels), 1 person hand held assist Gait Pattern/deviations: Step-through pattern, Decreased stride length, Wide base of support, Shuffle Gait velocity: reduced Gait velocity interpretation: <1.31 ft/sec, indicative of household ambulator   General Gait Details: mild shuffle but is related to stiffness in ankles  Stairs            Wheelchair Mobility    Modified Rankin (Stroke Patients Only)       Balance Overall balance assessment: Needs assistance Sitting-balance support: Feet supported Sitting balance-Leahy Scale: Good     Standing balance support: Bilateral upper extremity supported, During functional activity Standing balance-Leahy Scale: Fair Standing balance comment: less than fair dynamically                             Pertinent Vitals/Pain Pain Assessment Pain Assessment: No/denies pain    Home Living Family/patient expects to be discharged to:: Private residence Living Arrangements: Spouse/significant other Available Help at Discharge: Family;Available 24 hours/day  Type of Home: House Home Access: Stairs to enter Entrance Stairs-Rails: Right;Left;Can reach both Entrance Stairs-Number of Steps: 4   Home Layout: One level;Other (Comment) (2 steps to level with laundry) Home Equipment: Rollator (4 wheels);Shower seat Additional Comments: per pt the RW is a bit different feeling without the brakes    Prior Function Prior Level of Function :  Independent/Modified Independent             Mobility Comments: I with rollator after bout of HHPT       Hand Dominance   Dominant Hand: Right    Extremity/Trunk Assessment   Upper Extremity Assessment Upper Extremity Assessment: Overall WFL for tasks assessed    Lower Extremity Assessment Lower Extremity Assessment: Overall WFL for tasks assessed    Cervical / Trunk Assessment Cervical / Trunk Assessment: Normal  Communication   Communication: No difficulties  Cognition Arousal/Alertness: Awake/alert Behavior During Therapy: WFL for tasks assessed/performed Overall Cognitive Status: Within Functional Limits for tasks assessed                                 General Comments: able to give an accurate hx per wife hx        General Comments General comments (skin integrity, edema, etc.): pt is on a rollator at home so the Jon is a bit more support than needed    Exercises     Assessment/Plan    PT Assessment Patient needs continued PT services  PT Problem List Decreased range of motion;Decreased balance;Decreased mobility;Cardiopulmonary status limiting activity       PT Treatment Interventions DME instruction;Gait training;Stair training;Functional mobility training;Therapeutic activities;Therapeutic exercise;Balance training;Neuromuscular re-education;Patient/family education    PT Goals (Current goals can be found in the Care Plan section)  Acute Rehab PT Goals Patient Stated Goal: to get home to his own Jon PT Goal Formulation: With patient/family Time For Goal Achievement: 09/27/21 Potential to Achieve Goals: Good    Frequency Min 3X/week     Co-evaluation               AM-PAC PT "6 Clicks" Mobility  Outcome Measure Help needed turning from your back to your side while in a flat bed without using bedrails?: None Help needed moving from lying on your back to sitting on the side of a flat bed without using bedrails?: A  Little Help needed moving to and from a bed to a chair (including a wheelchair)?: A Little Help needed standing up from a chair using your arms (e.g., wheelchair or bedside chair)?: A Little Help needed to walk in hospital room?: A Little Help needed climbing 3-5 steps with a railing? : A Lot 6 Click Score: 18    End of Session Equipment Utilized During Treatment: Gait belt Activity Tolerance: Patient tolerated treatment well Patient left: in bed;with family/visitor present Nurse Communication: Mobility status PT Visit Diagnosis: Difficulty in walking, not elsewhere classified (R26.2)    Time: 0355-9741 PT Time Calculation (min) (ACUTE ONLY): 24 min   Charges:     PT Treatments $Gait Training: 8-22 mins $Therapeutic Exercise: 8-22 mins       Ramond Dial 09/13/2021, 2:49 PM  Mee Hives, PT PhD Acute Rehab Dept. Number: [place]

## 2021-09-13 NOTE — Progress Notes (Signed)
   09/13/21 1503  Vitals  Temp (!) 97.4 F (36.3 C)  Temp Source Oral  BP 103/65  MAP (mmHg) 77  BP Location Left Arm  BP Method Automatic  Patient Position (if appropriate) Lying  Pulse Rate 62  Pulse Rate Source Monitor  ECG Heart Rate 61  Resp 13  Level of Consciousness  Level of Consciousness Alert  MEWS COLOR  MEWS Score Color Green  Oxygen Therapy  SpO2 100 %  O2 Device Room Air  Pain Assessment  Pain Scale 0-10  Pain Score 0  MEWS Score  MEWS Temp 0  MEWS Systolic 0  MEWS Pulse 0  MEWS RR 1  MEWS LOC 0  MEWS Score 1

## 2021-09-13 NOTE — Progress Notes (Signed)
Pt arrived from ..ED..., A/ox .4..pt denies any pain, MD aware,CCMD called. CHG bath given,no further needs at this time   

## 2021-09-13 NOTE — ED Provider Notes (Signed)
[facility] EMERGENCY DEPARTMENT Provider Note   CSN: [id number] Arrival date & time: 09/13/21  0217     History  Chief Complaint  Patient presents with   Shortness of Breath   Chest Pain    Jon Williams is a 71 y.o. male.  HPI       71 year old male with a history of atrial fibrillation on xarelto, nonobstructive coronary artery disease, nonischemic cardiomyopathy, chronic combined congestive heart failure, gastric cancer, anemia who presents with concern for shortness of breath with chest tightness  Reports that he woke up at 130 this morning with shortness of breath and chest tightness.  Reports there is a tightness across his whole chest, like there is a band around him and its worse when he takes a deep breath.  Reports shortness of breath and feeling like he cannot take a deep breath.  The shortness of breath is also worse when he lays down flat.  The chest tightness has been constant since it began.  Initially was more severe 9 out of 10, and now is more like a 7 out of 10.  Denies any nausea, vomiting, lightheadedness, asymmetric leg pain or swelling, or fevers.  Reports he has had a cough  White sputum.  No nasal congestion or sore throat.  He has some tightness and sensation of swelling in his legs bilaterally.  No abdominal pain, black or bloody stools or other concerns.  Past Medical History:  Diagnosis Date   AICD (automatic cardioverter/defibrillator) present 05/25/2016   biv icd   Bunion, right    Chronic combined systolic and diastolic CHF (congestive heart failure) ([place])    Echo 1/18: Mild conc LVH, EF 15-20, severe diff HK, inf and inf-septal AK, Gr 3 DD, mild to mod MR, severe LAE, mod reduced RVSF, mod RAE, mild TR, PASP 50   Coronary artery disease involving native coronary artery without angina pectoris 04/17/2016   LHC 1/18: pLCx 30, mLCx 20, mRCA 40, dRCA 20, LVEDP 23, mean RA 8, PA 42/20, PCWP 17   Diabetes mellitus without  complication (HCC)    DJD (degenerative joint disease)    History of atrial fibrillation    History of atrial flutter    History of cardiomegaly 06/07/2016   Noted on CXR   History of colon polyps 06/28/2017   Noted on colonoscopy   LBBB (left bundle branch block)    NICM (nonischemic cardiomyopathy) ([place])    Echo 1/18:  Mild conc LVH, EF 15-20, severe diff HK, inf and inf-septal AK, Gr 3 DD, mild to mod MR, severe LAE, mod reduced RVSF, mod RAE, mild TR, PASP 50   OA (osteoarthritis)    knee   Other secondary pulmonary hypertension ([place]) 04/17/2016   Prostate cancer ([place]) 2019   Sigmoid diverticulosis 06/28/2017   Noted on colonoscopy  h  Past Surgical History:  Procedure Laterality Date   BIOPSY  12/01/2020   Procedure: BIOPSY;  Surgeon: [name], MD;  Location: [place];  Service: Endoscopy;;   BIOPSY  06/09/2021   Procedure: BIOPSY;  Surgeon: [name], MD;  Location: [facility];  Service: Gastroenterology;;   BIV ICD INSERTION CRT-D N/A 05/25/2016   Procedure: BiV ICD Insertion CRT-D;  Surgeon: [name], MD;  Location: [place] [facility];  Service: Cardiovascular;  Laterality: N/A;   CARDIAC CATHETERIZATION N/A 03/02/2016   Procedure: Right/Left Heart Cath and Coronary Angiography;  Surgeon: Nelva Bush, MD;  Location: [place] [facility];  Service: Cardiovascular;  Laterality: N/A;   CARDIOVERSION N/A 07/17/2016   Procedure: Cardioversion;  Surgeon: [name], MD;  Location: [place] [facility];  Service: Cardiovascular;  Laterality: N/A;   COLONOSCOPY WITH PROPOFOL N/A 06/28/2017   Procedure: COLONOSCOPY WITH PROPOFOL;  Surgeon: [name], MD;  Location: [facility];  Service: Endoscopy;  Laterality: N/A;   colonscopy  2009   ESOPHAGOGASTRODUODENOSCOPY N/A 12/01/2020   Procedure: ESOPHAGOGASTRODUODENOSCOPY (EGD);  Surgeon: [name], MD;  Location: [place];  Service: Endoscopy;  Laterality: N/A;  IDA/guaiac positive stools    ESOPHAGOGASTRODUODENOSCOPY (EGD) WITH PROPOFOL N/A 12/16/2020   Procedure: ESOPHAGOGASTRODUODENOSCOPY (EGD) WITH PROPOFOL;  Surgeon: [name], MD;  Location: [facility];  Service: Endoscopy;  Laterality: N/A;   ESOPHAGOGASTRODUODENOSCOPY (EGD) WITH PROPOFOL N/A 06/09/2021   Procedure: ESOPHAGOGASTRODUODENOSCOPY (EGD) WITH PROPOFOL;  Surgeon: [name], MD;  [facility]: [facility];  Service: Gastroenterology;  Laterality: N/A;   GOLD SEED IMPLANT N/A 12/24/2017   Procedure: GOLD SEED IMPLANT, TRANSERINEAL;  Surgeon: Festus Aloe, MD;  Location: [facility];  Service: Urology;  Laterality: N/A;   INSERT / REPLACE / REMOVE PACEMAKER     LEAD REVISION  10/10/2018   LEAD REVISION/REPAIR N/A 10/10/2018   Procedure: LEAD REVISION/REPAIR;  Surgeon: [name], MD;  Location: [place] [facility];  Service: Cardiovascular;  Laterality: N/A;   POLYPECTOMY  06/28/2017   Procedure: POLYPECTOMY;  Surgeon: [name], MD;  [facility];  Service: Endoscopy;;  ascending and descending colon polyp   PORTACATH PLACEMENT Right 12/23/2020   Procedure: INSERTION PORT-A-CATH;  Surgeon: [name], MD;  Location: [place];  Service: General;  Laterality: Right;   PROSTATE BIOPSY  02/20/2017   SPACE OAR INSTILLATION N/A 12/24/2017   Procedure: SPACE OAR INSTILLATION;  Surgeon: Festus Aloe, MD;  Location: [facility];  Service: Urology;  Laterality: N/A;   TOTAL KNEE ARTHROPLASTY Right 08/16/2017   Procedure: RIGHT TOTAL KNEE ARTHROPLASTY;  Surgeon: [name], MD;  Location: [place];  Service: Orthopedics;  Laterality: Right;   UPPER ESOPHAGEAL ENDOSCOPIC ULTRASOUND (EUS) N/A 12/16/2020   Procedure: UPPER ESOPHAGEAL ENDOSCOPIC ULTRASOUND (EUS);  Surgeon: [name], MD;  Location: [facility];  Service: Endoscopy;  Laterality: N/A;    Home Medications Prior to Admission medications   Medication Sig Start Date End Date Taking? Authorizing Provider  atorvastatin (LIPITOR) 40 MG tablet  Take 1 tablet (40 mg total) by mouth daily. 04/24/21   [name], MD  Biotin 1000 MCG tablet Take 1,000 mcg by mouth daily with breakfast.    [provider]  cholecalciferol (VITAMIN D3) 25 MCG (1000 UT) tablet Take 1,000 Units by mouth daily with breakfast.    [provider]  docusate sodium (COLACE) 100 MG capsule Take 1 capsule (100 mg total) by mouth 2 (two) times daily as needed for mild constipation. 01/06/21   Davonna Belling, MD  furosemide (LASIX) 40 MG tablet Take 1/2 tablet by mouth daily, may take 1/2 tablet extra daily for fluid 06/07/21   Swinyer, Lanice Schwab, NP  gabapentin (NEURONTIN) 100 MG capsule TAKE 1 CAPSULE(100 MG) BY MOUTH THREE TIMES DAILY. START AT NIGHT, AND GRADUALLY. INCREASE DOSE AS TOLERATES 08/07/21   Truitt Merle, MD  metFORMIN (GLUCOPHAGE-XR) 500 MG 24 hr tablet Take 500 mg by mouth every evening. 06/13/20   [provider]  metoprolol succinate (TOPROL-XL) 25 MG 24 hr tablet TAKE 1 TABLET(25 MG) BY MOUTH DAILY 06/08/21   Swinyer, Lanice Schwab, NP  multivitamin (ONE-A-DAY MEN'S) TABS tablet Take 1 tablet  by mouth daily with breakfast.    [provider]  mupirocin ointment (BACTROBAN) 2 % Apply 1 application. topically daily. For wound care 05/05/21   Landis Martins, DPM  pantoprazole (PROTONIX) 40 MG tablet Take 1 tablet (40 mg total) by mouth 2 (two) times daily. 12/02/20   Aline August, MD  polyethylene glycol (MIRALAX / GLYCOLAX) 17 g packet Take 17 g by mouth daily as needed for moderate constipation. 01/06/21   Davonna Belling, MD  potassium chloride (KLOR-CON) 10 MEQ tablet Twice daily 05/31/21   Daleen Bo, MD  sacubitril-valsartan (ENTRESTO) 24-26 MG Take 1 tablet by mouth 2 (two) times daily. 05/16/21   [name], MD  sotalol (BETAPACE) 80 MG tablet Take 1.5 tablets (120 mg total) by mouth in the morning and at bedtime. 03/01/21   [name], MD  traMADol (ULTRAM) 50 MG tablet Take 1 tablet (50 mg total) by  mouth every 12 (twelve) hours as needed. 04/21/21   Truitt Merle, MD  prochlorperazine (COMPAZINE) 10 MG tablet Take 1 tablet (10 mg total) by mouth every 6 (six) hours as needed (Nausea or vomiting). 02/15/21 05/12/21  Truitt Merle, MD      Allergies    Aspirin and Sulfa antibiotics    Review of Systems   Review of Systems  Physical Exam Updated Vital Signs BP 121/78   Pulse 60   Temp 98 F (36.7 C) (Oral)   Resp 14   SpO2 100%  Physical Exam Vitals and nursing note reviewed.  Constitutional:      General: He is not in acute distress.    Appearance: He is well-developed. He is not diaphoretic.  HENT:     Head: Normocephalic and atraumatic.  Eyes:     Conjunctiva/sclera: Conjunctivae normal.  Neck:     Vascular: JVD present.  Cardiovascular:     Rate and Rhythm: Normal rate and regular rhythm.     Heart sounds: Normal heart sounds. No murmur heard.    No friction rub. No gallop.  Pulmonary:     Effort: Pulmonary effort is normal. Tachypnea present. No respiratory distress.     Breath sounds: Normal breath sounds. No wheezing or rales.  Abdominal:     General: There is no distension.     Palpations: Abdomen is soft.     Tenderness: There is no abdominal tenderness. There is no guarding.  Musculoskeletal:     Cervical back: Normal range of motion.     Right lower leg: Edema present.     Left lower leg: Edema present.  Skin:    General: Skin is warm and dry.  Neurological:     Mental Status: He is alert and oriented to person, place, and time.     ED Results / Procedures / Treatments   Labs (all labs ordered are listed, but only abnormal results are displayed) Labs Reviewed  BASIC METABOLIC PANEL - Abnormal; Notable for the following components:      Result Value   Glucose, Bld 120 (*)    All other components within normal limits  CBC - Abnormal; Notable for the following components:   RBC 3.74 (*)    Hemoglobin 11.9 (*)    HCT 36.1 (*)    All other components within  normal limits  BRAIN NATRIURETIC PEPTIDE - Abnormal; Notable for the following components:   B Natriuretic Peptide 1,233.3 (*)    All other components within normal limits  HEPATIC FUNCTION PANEL - Abnormal; Notable for the following  components:   Total Protein 6.4 (*)    ALT 55 (*)    Bilirubin, Direct 0.3 (*)    All other components within normal limits  TROPONIN I (HIGH SENSITIVITY)  TROPONIN I (HIGH SENSITIVITY)    EKG EKG Interpretation  Date/Time:  Wednesday September 13 2021 02:30:27 EDT Ventricular Rate:  71 PR Interval:    QRS Duration: 186 QT Interval:  524 QTC Calculation: 569 R Axis:   148 Text Interpretation: Ventricular-paced rhythm Abnormal ECG When compared with ECG of 31-May-2021 13:05, Artifact present, no other significant changes Confirmed by Gareth Morgan [phone]) on 09/13/2021 3:12:35 AM  Radiology DG Chest 2 View  Result Date: 09/13/2021 CLINICAL DATA:  Shortness of breath and chest pain EXAM: CHEST - 2 VIEW COMPARISON:  04/10/2021 FINDINGS: Check shadow is enlarged. Defibrillator is again noted. Right chest wall port is again seen and stable. The lungs are clear. No bony abnormality is noted. IMPRESSION: No acute abnormality noted. Electronically Signed   By: Inez Catalina M.D.   On: 09/13/2021 03:11   CT CHEST ABDOMEN PELVIS W CONTRAST  Result Date: 09/11/2021 CLINICAL DATA:  Gastric cancer, monitor.  * Tracking Code: BO * EXAM: CT CHEST, ABDOMEN, AND PELVIS WITH CONTRAST TECHNIQUE: Multidetector CT imaging of the chest, abdomen and pelvis was performed following the standard protocol during bolus administration of intravenous contrast. RADIATION DOSE REDUCTION: This exam was performed according to the departmental dose-optimization program which includes automated exposure control, adjustment of the mA and/or kV according to patient size and/or use of iterative reconstruction technique. CONTRAST:  142m OMNIPAQUE IOHEXOL 300 MG/ML  SOLN COMPARISON:  Multiple  priors including most recent CTs dated May 31, 2021 April 13, 2021 and April 01, 2021. FINDINGS: CT CHEST FINDINGS Cardiovascular: Accessed right chest Port-A-Cath with tip in the SVC near the superior cavoatrial junction. Left chest AICD/pacemaker with leads in the right atrium, right ventricle and coronary sinus. Normal caliber thoracic aorta. Aortic atherosclerosis. No central pulmonary embolus on this nondedicated study. Four-chamber cardiomegaly. Coronary artery calcifications. No significant pericardial effusion/thickening. No suspicious thyroid nodule. Mediastinum/Nodes: No suspicious thyroid nodule. Prominent mediastinal lymph nodes are similar prior for instance a right paratracheal lymph node measuring 8 mm in short axis on image 17/2 and a low right paratracheal/precarinal lymph node measuring 8 mm on image 21/2. No pathologically enlarged mediastinal, hilar or axillary lymph nodes. Patulous esophagus with a small hiatal hernia. Lungs/Pleura: No suspicious pulmonary nodules or masses. Small right-greater-than-left pleural effusions. No pneumothorax. Musculoskeletal: No aggressive lytic or blastic lesion of bone. CT ABDOMEN PELVIS FINDINGS Hepatobiliary: No suspicious hepatic lesion. Gallbladder is unremarkable. No biliary ductal dilation. Pancreas: No pancreatic ductal dilation or evidence of acute inflammation. Spleen: No splenomegaly or focal splenic lesion. Adrenals/Urinary Tract: Similar lobular thickening of the right adrenal gland, favor hyperplasia. Left adrenal nodule measures 16 mm stable over multiple prior imaging studies with Hounsfield units of -3 on prior non contrasted PET-CT from February 29, 2019 compatible with a benign adrenal adenoma requiring no additional imaging follow-up. No hydronephrosis. Kidneys demonstrate symmetric enhancement and excretion of contrast material. Mild symmetric wall thickening of an incompletely distended urinary bladder. Stomach/Bowel: Radiopaque enteric  contrast material traverses the rectum. Stomach is minimally distended limiting evaluation. No pathologic dilation of small or large bowel. The appendix and terminal ileum appear normal. No evidence of acute bowel inflammation. Vascular/Lymphatic: Aortic atherosclerosis. No pathologically enlarged abdominal or pelvic lymph nodes. Reproductive: Fiducial markers in the prostate gland. Other: No significant abdominopelvic free fluid. Small fat  containing right inguinal hernia. Small fat containing paraumbilical hernia. Musculoskeletal: No aggressive lytic or blastic lesion of bone. Bilateral L4 pars defects with similar grade 2, L4 on L5 anterolisthesis. Multilevel degenerative changes spine. Transitional type L5 vertebral body. IMPRESSION: 1. Stable examination without new or progressive finding to suggest recurrent or active metastatic disease in the chest, abdomen or pelvis. 2. Stable benign 16 mm left adrenal adenoma which requires no imaging follow-up. 3. Four-chamber cardiomegaly with small right-greater-than-left pleural effusions. 4.  Aortic Atherosclerosis (ICD10-I70.0). Electronically Signed   By: Dahlia Bailiff M.D.   On: 09/11/2021 13:20    Procedures Procedures    Medications Ordered in ED Medications  nitroGLYCERIN (NITROSTAT) SL tablet 0.4 mg (0.4 mg Sublingual Given 09/13/21 0518)    ED Course/ Medical Decision Making/ A&P                           Medical Decision Making Amount and/or Complexity of Data Reviewed Labs: ordered. Radiology: ordered.  Risk Prescription drug management. Decision regarding hospitalization.    71 year old male with a history of atrial fibrillation on xarelto, nonobstructive coronary artery disease, nonischemic cardiomyopathy (last cath 2018) chronic combined congestive heart failure, gastric cancer, anemia who presents with concern for shortness of breath with chest tightness.  Differential diagnosis for chest pain includes pulmonary embolus,  dissection, pneumothorax, pneumonia, ACS, myocarditis, pericarditis.  EKG was done and evaluate by me and showed no acute ST changes and no signs of pericarditis. Chest x-ray was done and evaluated by me and radiology and showed no sign of pneumonia or pneumothorax.  Do not feel history or exam are consistent with aortic dissection.  Labs completed and personally evaluated interpreted by me show mild anemia, no significant electrolyte abnormalities.  Troponin negative.  BNP elevated to 1230s, which is similar to value in April, however significantly increased since all prior values.  Given cancer history, chest x-ray without acute abnormalities, will order PE study to evaluate for PE as cause of his increased shortness of breath.  Consider worsening CHF or anginal symptoms.    PE study completed shows pulmonary edema and pleural effusion.  Ordered lasix IV. Will admit given CP, worsening CHF. Patient discharged in stable condition with understanding of reasons to return.           Final Clinical Impression(s) / ED Diagnoses Final diagnoses:  Chest pain, unspecified type  Dyspnea, unspecified type    Rx / DC Orders ED Discharge Orders     None         Gareth Morgan, MD 09/13/21 [phone]

## 2021-09-13 NOTE — Consult Note (Cosign Needed)
Cardiology Consultation:   Patient ID: Jon Williams MRN: [id number]; DOB: Oct 10, 1950  Admit date: 09/13/2021 Date of Consult: 09/13/2021  PCP:  [name], MD   [facility] Providers Cardiologist:  Mertie Moores, MD  Electrophysiologist:  Cristopher Peru, MD    Patient Profile:   Jon Williams is a 71 y.o. male with a hx of HFrEF/NICM s/p ICD, nonobstructive CAD, persistent atrial fibrillation, gastric cancer/GIB, who is being seen 09/13/2021 for the evaluation of CHF at the request of Dr. Lorin Mercy.  History of Present Illness:   Jon Williams is a 71 year old male with past medical history noted above.  He has been followed primarily with Dr. Lovena Williams as an outpatient.  Underwent right and left heart cath in 2018 which showed mild nonobstructive CAD with 30% circumflex and 40% RCA disease.  Echocardiogram at that time showed LVEF of 15 to 20% with severe diffuse hypokinesis, inferior and inferior septal akinesis with grade 3 diastolic dysfunction, mild to moderate MR, severe left atrial enlargement with moderately reduced RVSF, mild TR. he was referred to EP and underwent [place] biventricular ICD on 05/2016 with Dr. Lovena Williams.  He has a known history of persistent atrial fibrillation, was trialed on Tikosyn and current management with sotalol. Management strategy has been for rate control.  He was admitted 11/2020 with generalized weakness and fatigue and found to be profoundly anemic with a hemoglobin of 7.1.  Underwent EGD during that admission that showed possible malignant gastric tumor which was biopsied.  General surgery and oncology were consulted during that admission with recommendations for outpatient follow-up.  He was discharged from the hospital and seen by Dr. Burr Williams 12/08/2020 with confirmed diagnosis of adenocarcinoma.  He was then seen by general surgery 04/2021 who felt his performance status was too poor to consider surgery.  Had a surveillance endoscopy 05/2021 which showed marked  improvement with biopsy of scarred area negative for malignancy.  Given his good response and no residual disease on endoscopy it was recommended that he move to surveillance therapy per oncology.  Echocardiogram 03/2021 showed LVEF of 20 to 25%, global hypokinesis, RV was not well visualized but appeared grossly normal in size and function, mildly dilated left atrium, mild mitral regurgitation.   Presented to the ED on 8/2 with complaints of shortness of breath and chest discomfort.  States he noticed an increased cough over the past several days.  Has had slight decline in urine output.  Woke up this morning around 130am with worsening shortness of breath and chest tightness.  With worsening symptoms he presented to the ED for further evaluation.  In the ED labs showed sodium 139, potassium 3.6, creatinine 0.9, magnesium 1.9, BNP 1233, high-sensitivity troponin 5>> 5, WBC 5.4, hemoglobin 11.9.  EKG appears to be atrial flutter, V paced.  Chest x-ray negative for edema.  CT angio negative for PE, mild pulmonary edema with small right pleural effusion.  He was given IV Lasix and admitted to internal medicine for further management.  Cardiology has been asked to evaluate in regards to his heart failure.  In talking with the patient he reports his baseline weight runs around 196 pounds.  He has been compliant with his medications.  Denies any increased fluid intake or dietary indiscretion.  Past Medical History:  Diagnosis Date   AICD (automatic cardioverter/defibrillator) present 05/25/2016   biv icd   Bunion, right    Chronic combined systolic and diastolic CHF (congestive heart failure) ([place])    Echo 1/18: Mild conc  LVH, EF 15-20, severe diff HK, inf and inf-septal AK, Gr 3 DD, mild to mod MR, severe LAE, mod reduced RVSF, mod RAE, mild TR, PASP 50   Coronary artery disease involving native coronary artery without angina pectoris 04/17/2016   LHC 1/18: pLCx 30, mLCx 20, mRCA 40, dRCA 20, LVEDP 23,  mean RA 8, PA 42/20, PCWP 17   Diabetes mellitus without complication (HCC)    DJD (degenerative joint disease)    Gastric cancer (HCC)    History of atrial fibrillation    History of atrial flutter    History of cardiomegaly 06/07/2016   Noted on CXR   History of colon polyps 06/28/2017   Noted on colonoscopy   LBBB (left bundle branch block)    NICM (nonischemic cardiomyopathy) ([place])    Echo 1/18:  Mild conc LVH, EF 15-20, severe diff HK, inf and inf-septal AK, Gr 3 DD, mild to mod MR, severe LAE, mod reduced RVSF, mod RAE, mild TR, PASP 50   OA (osteoarthritis)    knee   Other secondary pulmonary hypertension ([place]) 04/17/2016   Prostate cancer ([place]) 2019   Sigmoid diverticulosis 06/28/2017   Noted on colonoscopy    Past Surgical History:  Procedure Laterality Date   BIOPSY  12/01/2020   Procedure: BIOPSY;  Surgeon: [name], MD;  Location: [place];  Service: Endoscopy;;   BIOPSY  06/09/2021   Procedure: BIOPSY;  Surgeon: [name], MD;  Location: [facility];  Service: Gastroenterology;;   BIV ICD INSERTION CRT-D N/A 05/25/2016   Procedure: BiV ICD Insertion CRT-D;  Surgeon: [name], MD;  Location: [place] [facility];  Service: Cardiovascular;  Laterality: N/A;   CARDIAC CATHETERIZATION N/A 03/02/2016   Procedure: Right/Left Heart Cath and Coronary Angiography;  Surgeon: Nelva Bush, MD;  Location: [place] [facility];  Service: Cardiovascular;  Laterality: N/A;   CARDIOVERSION N/A 07/17/2016   Procedure: Cardioversion;  Surgeon: [name], MD;  Location: [place] [facility];  Service: Cardiovascular;  Laterality: N/A;   COLONOSCOPY WITH PROPOFOL N/A 06/28/2017   Procedure: COLONOSCOPY WITH PROPOFOL;  Surgeon: [name], MD;  Location: [facility];  Service: Endoscopy;  Laterality: N/A;   colonscopy  2009   ESOPHAGOGASTRODUODENOSCOPY N/A 12/01/2020   Procedure: ESOPHAGOGASTRODUODENOSCOPY (EGD);  Surgeon: [name], MD;  Location: [place];   Service: Endoscopy;  Laterality: N/A;  IDA/guaiac positive stools   ESOPHAGOGASTRODUODENOSCOPY (EGD) WITH PROPOFOL N/A 12/16/2020   Procedure: ESOPHAGOGASTRODUODENOSCOPY (EGD) WITH PROPOFOL;  Surgeon: [name], MD;  [facility]: [facility];  Service: Endoscopy;  Laterality: N/A;   ESOPHAGOGASTRODUODENOSCOPY (EGD) WITH PROPOFOL N/A 06/09/2021   Procedure: ESOPHAGOGASTRODUODENOSCOPY (EGD) WITH PROPOFOL;  Surgeon: [name], MD;  [facility]: [facility];  Service: Gastroenterology;  Laterality: N/A;   GOLD SEED IMPLANT N/A 12/24/2017   Procedure: GOLD SEED IMPLANT, TRANSERINEAL;  Surgeon: Festus Aloe, MD;  Location: [facility];  Service: Urology;  Laterality: N/A;   INSERT / REPLACE / REMOVE PACEMAKER     LEAD REVISION  10/10/2018   LEAD REVISION/REPAIR N/A 10/10/2018   Procedure: LEAD REVISION/REPAIR;  Surgeon: [name], MD;  Location: [place] [facility];  Service: Cardiovascular;  Laterality: N/A;   POLYPECTOMY  06/28/2017   Procedure: POLYPECTOMY;  Surgeon: [name], MD;  [facility];  Service: Endoscopy;;  ascending and descending colon polyp   PORTACATH PLACEMENT Right 12/23/2020   Procedure: INSERTION PORT-A-CATH;  Surgeon: [name], MD;  Location: [place];  Service: General;  Laterality: Right;  PROSTATE BIOPSY  02/20/2017   SPACE OAR INSTILLATION N/A 12/24/2017   Procedure: SPACE OAR INSTILLATION;  Surgeon: Festus Aloe, MD;  Location: [facility];  Service: Urology;  Laterality: N/A;   TOTAL KNEE ARTHROPLASTY Right 08/16/2017   Procedure: RIGHT TOTAL KNEE ARTHROPLASTY;  Surgeon: [name], MD;  Location: [place];  Service: Orthopedics;  Laterality: Right;   UPPER ESOPHAGEAL ENDOSCOPIC ULTRASOUND (EUS) N/A 12/16/2020   Procedure: UPPER ESOPHAGEAL ENDOSCOPIC ULTRASOUND (EUS);  Surgeon: [name], MD;  Location: [facility];  Service: Endoscopy;  Laterality: N/A;     Home Medications:  Prior to Admission medications   Medication Sig Start Date End  Date Taking? Authorizing Provider  atorvastatin (LIPITOR) 40 MG tablet Take 1 tablet (40 mg total) by mouth daily. 04/24/21  Yes [name], MD  Biotin 1000 MCG tablet Take 1,000 mcg by mouth daily with breakfast.   Yes [provider]  cholecalciferol (VITAMIN D3) 25 MCG (1000 UT) tablet Take 1,000 Units by mouth daily with breakfast.   Yes [provider]  clotrimazole-betamethasone (LOTRISONE) cream Apply 1 Application topically 2 (two) times daily. 09/06/21  Yes [provider]  furosemide (LASIX) 40 MG tablet Take 1/2 tablet by mouth daily, may take 1/2 tablet extra daily for fluid Patient taking differently: Take 20 mg by mouth daily. 06/07/21  Yes Swinyer, Lanice Schwab, NP  gabapentin (NEURONTIN) 100 MG capsule TAKE 1 CAPSULE(100 MG) BY MOUTH THREE TIMES DAILY. START AT NIGHT, AND GRADUALLY. INCREASE DOSE AS TOLERATES Patient taking differently: Take 100 mg by mouth 2 (two) times daily. 08/07/21  Yes Truitt Merle, MD  metFORMIN (GLUCOPHAGE-XR) 500 MG 24 hr tablet Take 500 mg by mouth every evening. 06/13/20  Yes [provider]  metoprolol succinate (TOPROL-XL) 25 MG 24 hr tablet TAKE 1 TABLET(25 MG) BY MOUTH DAILY Patient taking differently: Take 25 mg by mouth daily. 06/08/21  Yes Swinyer, Lanice Schwab, NP  multivitamin (ONE-A-DAY MEN'S) TABS tablet Take 1 tablet by mouth daily with breakfast.   Yes [provider]  pantoprazole (PROTONIX) 40 MG tablet Take 1 tablet (40 mg total) by mouth 2 (two) times daily. 12/02/20  Yes Aline August, MD  potassium chloride (KLOR-CON) 10 MEQ tablet Twice daily 05/31/21  Yes Daleen Bo, MD  sacubitril-valsartan (ENTRESTO) 24-26 MG Take 1 tablet by mouth 2 (two) times daily. 05/16/21  Yes [name], MD  sotalol (BETAPACE) 80 MG tablet Take 1.5 tablets (120 mg total) by mouth in the morning and at bedtime. 03/01/21  Yes [name], MD  prochlorperazine (COMPAZINE) 10 MG tablet Take 1 tablet (10 mg total) by  mouth every 6 (six) hours as needed (Nausea or vomiting). 02/15/21 05/12/21  Truitt Merle, MD    Inpatient Medications: Scheduled Meds:  atorvastatin  40 mg Oral Daily   docusate sodium  100 mg Oral BID   enoxaparin (LOVENOX) injection  40 mg Subcutaneous Q24H   furosemide  40 mg Intravenous BID   gabapentin  100 mg Oral BID   insulin aspart  0-15 Units Subcutaneous TID WC   insulin aspart  0-5 Units Subcutaneous QHS   pantoprazole  40 mg Oral BID   sacubitril-valsartan  1 tablet Oral BID   sodium chloride flush  3 mL Intravenous Q12H   sotalol  120 mg Oral Q12H   Continuous Infusions:  PRN Meds: acetaminophen **OR** acetaminophen, bisacodyl, hydrALAZINE, morphine injection, nitroGLYCERIN, ondansetron **OR** ondansetron (ZOFRAN) IV, oxyCODONE, polyethylene glycol, traZODone  Allergies:    Allergies  Allergen  Reactions   Aspirin Anaphylaxis and Hives   Sulfa Antibiotics Anaphylaxis, Hives, Swelling and Other (See Comments)    Swollen lips    Social History:   Social History   Socioeconomic History   Marital status: Married    Spouse name: Not on file   Number of children: 2   Years of education: Not on file   Highest education level: Not on file  Occupational History   Occupation: retired  Tobacco Use   Smoking status: Never   Smokeless tobacco: Never  Vaping Use   Vaping Use: Never used  Substance and Sexual Activity   Alcohol use: No   Drug use: No   Sexual activity: Not Currently  Other Topics Concern   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 Daughter, Beckie Busing, lives in [place]. Son, Alford, lives in [place].   Social Determinants of Health   Financial Resource Strain: Not on file  Food Insecurity: Not on file  Transportation Needs: Not on file  Physical Activity: Not on file  Stress: Not on file  Social Connections: Not on file  Intimate Partner Violence: Not on file    Family History:     Family History  Problem Relation Age of Onset   Hypertension Mother    Heart disease Mother    Diabetes Mother    Diabetes Father    Hypertension Father    Cancer Father        lung cancer   Healthy Sister    Heart attack Brother    Heart disease Brother 94       + tobacco   Healthy Brother      ROS:  Please see the history of present illness.   All other ROS reviewed and negative.     Physical Exam/Data:   Vitals:   09/13/21 0717 09/13/21 0718 09/13/21 0745 09/13/21 1045  BP:  (!) 131/92 120/81 119/88  Pulse:  (!) 48 (!) 58 67  Resp:  20 20 (!) 28  Temp:  97.6 F (36.4 C)  97.9 F (36.6 C)  TempSrc:  Oral  Oral  SpO2: 100% 100% 100% 99%  Weight:  89.4 kg    Height:  '5\' 6"'$  (1.676 m)      Intake/Output Summary (Last 24 hours) at 09/13/2021 1316 Last data filed at 09/13/2021 0803 Gross per 24 hour  Intake --  Output 750 ml  Net -750 ml      09/13/2021    7:18 AM 06/16/2021   10:39 AM 06/09/2021    9:09 AM  Last 3 Weights  Weight (lbs) 197 lb 192 lb 3 oz 188 lb  Weight (kg) 89.359 kg 87.176 kg 85.276 kg     Body mass index is 31.8 kg/m.  General:  Well nourished, well developed, in no acute distress HEENT: normal Neck: + JVD to jaw at 45 degrees Vascular: No carotid bruits; Distal pulses 2+ bilaterally Cardiac:  normal S1, S2; RRR; soft murmur  Lungs: Diminished in bases Abd: soft, nontender, no hepatomegaly  Ext: no edema Musculoskeletal:  No deformities, BUE and BLE strength normal and equal Skin: warm and dry  Neuro:  CNs 2-12 intact, no focal abnormalities noted Psych:  Normal affect   EKG:  The EKG was personally reviewed and demonstrates: Atrial flutter, V paced   Relevant CV Studies:  Echo: 03/2021  IMPRESSIONS     1. Left ventricular ejection fraction, by estimation, is 20 to  25%. The  left ventricle has severely decreased function. The left ventricle  demonstrates global hypokinesis. Left ventricular diastolic parameters are   indeterminate.   2. RV not well visualized, grossly appears normal in size and function. .  Right ventricular systolic function was not well visualized. The right  ventricular size is not well visualized. Tricuspid regurgitation signal is  inadequate for assessing PA  pressure.   3. Left atrial size was mildly dilated.   4. The mitral valve is abnormal. Mild mitral valve regurgitation. No  evidence of mitral stenosis.   5. The aortic valve was not well visualized. Aortic valve regurgitation  is not visualized. No aortic stenosis is present.   6. The inferior vena cava is normal in size with greater than 50%  respiratory variability, suggesting right atrial pressure of 3 mmHg.   FINDINGS   Left Ventricle: Left ventricular ejection fraction, by estimation, is 20  to 25%. The left ventricle has severely decreased function. The left  ventricle demonstrates global hypokinesis. The left ventricular internal  cavity size was normal in size. There  is no left ventricular hypertrophy. Left ventricular diastolic parameters  are indeterminate.   Right Ventricle: RV not well visualized, grossly appears normal in size  and function. The right ventricular size is not well visualized. Right  vetricular wall thickness was not well visualized. Right ventricular  systolic function was not well visualized.   Tricuspid regurgitation signal is inadequate for assessing PA pressure.   Left Atrium: Left atrial size was mildly dilated.   Right Atrium: Right atrial size was normal in size.   Pericardium: There is no evidence of pericardial effusion.   Mitral Valve: The mitral valve is abnormal. Mild mitral valve  regurgitation. No evidence of mitral valve stenosis. MV peak gradient,  38.7 mmHg. The mean mitral valve gradient is 28.0 mmHg.   Tricuspid Valve: The tricuspid valve is not well visualized. Tricuspid  valve regurgitation is not demonstrated. No evidence of tricuspid  stenosis.   Aortic  Valve: The aortic valve was not well visualized. Aortic valve  regurgitation is not visualized. No aortic stenosis is present. Aortic  valve mean gradient measures 2.0 mmHg. Aortic valve peak gradient measures  4.5 mmHg. Aortic valve area, by VTI  measures 1.18 cm.   Pulmonic Valve: The pulmonic valve was not well visualized. Pulmonic valve  regurgitation is not visualized. No evidence of pulmonic stenosis.   Aorta: The aortic root is normal in size and structure.   Venous: The inferior vena cava is normal in size with greater than 50%  respiratory variability, suggesting right atrial pressure of 3 mmHg.   IAS/Shunts: No atrial level shunt detected by color flow Doppler.   Laboratory Data:  High Sensitivity Troponin:   Recent Labs  Lab 09/13/21 0246 09/13/21 0454  TROPONINIHS 5 5     Chemistry Recent Labs  Lab 09/11/21 1024 09/13/21 0246 09/13/21 0454  NA 142 139  --   K 3.8 3.6  --   CL 110 110  --   CO2 27 22  --   GLUCOSE 114* 120*  --   BUN 21 14  --   CREATININE 1.04 0.99  --   CALCIUM 9.0 9.0  --   MG  --   --  1.9  GFRNONAA >60 >60  --   ANIONGAP 5 7  --     Recent Labs  Lab 09/11/21 1024 09/13/21 0246  PROT 6.8 6.4*  ALBUMIN 4.3 3.9  AST 22  41  ALT 35 55*  ALKPHOS 56 62  BILITOT 1.3* 1.0   Lipids No results for input(s): "CHOL", "TRIG", "HDL", "LABVLDL", "LDLCALC", "CHOLHDL" in the last 168 hours.  Hematology Recent Labs  Lab 09/11/21 1024 09/13/21 0246  WBC 6.5 5.4  RBC 3.73* 3.74*  HGB 11.8* 11.9*  HCT 35.0* 36.1*  MCV 93.8 96.5  MCH 31.6 31.8  MCHC 33.7 33.0  RDW 15.1 15.2  PLT 163 164   Thyroid No results for input(s): "TSH", "FREET4" in the last 168 hours.  BNP Recent Labs  Lab 09/13/21 0246  BNP 1,233.3*    DDimer No results for input(s): "DDIMER" in the last 168 hours.   Radiology/Studies:  CT Angio Chest PE W and/or Wo Contrast  Result Date: 09/13/2021 CLINICAL DATA:  Evaluate for pulmonary embolism. High probability.  Shortness of breath and chest tightness. EXAM: CT ANGIOGRAPHY CHEST WITH CONTRAST TECHNIQUE: Multidetector CT imaging of the chest was performed using the standard protocol during bolus administration of intravenous contrast. Multiplanar CT image reconstructions and MIPs were obtained to evaluate the vascular anatomy. RADIATION DOSE REDUCTION: This exam was performed according to the departmental dose-optimization program which includes automated exposure control, adjustment of the mA and/or kV according to patient size and/or use of iterative reconstruction technique. CONTRAST:  91m OMNIPAQUE IOHEXOL 350 MG/ML SOLN COMPARISON:  09/11/2021 FINDINGS: Cardiovascular: Satisfactory opacification of the pulmonary arteries to the segmental level. No evidence of pulmonary embolism. The heart size is enlarged. There is a left chest wall AICD with leads in the right atrial appendage and right ventricle. No pericardial effusion. Mild aortic atherosclerosis and multi vessel coronary artery calcifications. Mediastinum/Nodes: No enlarged mediastinal, hilar, or axillary lymph nodes. Thyroid gland, trachea, and esophagus demonstrate no significant findings. Lungs/Pleura: There is a small right pleural effusion which appears similar to the previous exam. There is mild diffuse increase interlobular septal thickening. No airspace consolidation. No suspicious pulmonary nodule or mass. Upper Abdomen: There is reflux of contrast material into the IVC and hepatic veins compatible with right heart failure. Unchanged appearance of 1.6 cm left adrenal nodule compatible with a benign adenoma. No follow-up imaging recommended. Musculoskeletal: No chest wall abnormality. No acute or significant osseous findings. Review of the MIP images confirms the above findings. IMPRESSION: 1. No evidence for acute pulmonary embolus. 2. Cardiac enlargement, mild pulmonary edema and small right pleural effusion compatible with CHF. 3. Aortic Atherosclerosis  (ICD10-I70.0). Multi vessel coronary artery calcifications. Electronically Signed   By: TKerby MoorsM.D.   On: 09/13/2021 06:16   DG Chest 2 View  Result Date: 09/13/2021 CLINICAL DATA:  Shortness of breath and chest pain EXAM: CHEST - 2 VIEW COMPARISON:  04/10/2021 FINDINGS: Check shadow is enlarged. Defibrillator is again noted. Right chest wall port is again seen and stable. The lungs are clear. No bony abnormality is noted. IMPRESSION: No acute abnormality noted. Electronically Signed   By: MInez CatalinaM.D.   On: 09/13/2021 03:11   CT CHEST ABDOMEN PELVIS W CONTRAST  Result Date: 09/11/2021 CLINICAL DATA:  Gastric cancer, monitor.  * Tracking Code: BO * EXAM: CT CHEST, ABDOMEN, AND PELVIS WITH CONTRAST TECHNIQUE: Multidetector CT imaging of the chest, abdomen and pelvis was performed following the standard protocol during bolus administration of intravenous contrast. RADIATION DOSE REDUCTION: This exam was performed according to the departmental dose-optimization program which includes automated exposure control, adjustment of the mA and/or kV according to patient size and/or use of iterative reconstruction technique. CONTRAST:  1081mOMNIPAQUE IOHEXOL  300 MG/ML  SOLN COMPARISON:  Multiple priors including most recent CTs dated May 31, 2021 April 13, 2021 and April 01, 2021. FINDINGS: CT CHEST FINDINGS Cardiovascular: Accessed right chest Port-A-Cath with tip in the SVC near the superior cavoatrial junction. Left chest AICD/pacemaker with leads in the right atrium, right ventricle and coronary sinus. Normal caliber thoracic aorta. Aortic atherosclerosis. No central pulmonary embolus on this nondedicated study. Four-chamber cardiomegaly. Coronary artery calcifications. No significant pericardial effusion/thickening. No suspicious thyroid nodule. Mediastinum/Nodes: No suspicious thyroid nodule. Prominent mediastinal lymph nodes are similar prior for instance a right paratracheal lymph node measuring  8 mm in short axis on image 17/2 and a low right paratracheal/precarinal lymph node measuring 8 mm on image 21/2. No pathologically enlarged mediastinal, hilar or axillary lymph nodes. Patulous esophagus with a small hiatal hernia. Lungs/Pleura: No suspicious pulmonary nodules or masses. Small right-greater-than-left pleural effusions. No pneumothorax. Musculoskeletal: No aggressive lytic or blastic lesion of bone. CT ABDOMEN PELVIS FINDINGS Hepatobiliary: No suspicious hepatic lesion. Gallbladder is unremarkable. No biliary ductal dilation. Pancreas: No pancreatic ductal dilation or evidence of acute inflammation. Spleen: No splenomegaly or focal splenic lesion. Adrenals/Urinary Tract: Similar lobular thickening of the right adrenal gland, favor hyperplasia. Left adrenal nodule measures 16 mm stable over multiple prior imaging studies with Hounsfield units of -3 on prior non contrasted PET-CT from February 29, 2019 compatible with a benign adrenal adenoma requiring no additional imaging follow-up. No hydronephrosis. Kidneys demonstrate symmetric enhancement and excretion of contrast material. Mild symmetric wall thickening of an incompletely distended urinary bladder. Stomach/Bowel: Radiopaque enteric contrast material traverses the rectum. Stomach is minimally distended limiting evaluation. No pathologic dilation of small or large bowel. The appendix and terminal ileum appear normal. No evidence of acute bowel inflammation. Vascular/Lymphatic: Aortic atherosclerosis. No pathologically enlarged abdominal or pelvic lymph nodes. Reproductive: Fiducial markers in the prostate gland. Other: No significant abdominopelvic free fluid. Small fat containing right inguinal hernia. Small fat containing paraumbilical hernia. Musculoskeletal: No aggressive lytic or blastic lesion of bone. Bilateral L4 pars defects with similar grade 2, L4 on L5 anterolisthesis. Multilevel degenerative changes spine. Transitional type L5  vertebral body. IMPRESSION: 1. Stable examination without new or progressive finding to suggest recurrent or active metastatic disease in the chest, abdomen or pelvis. 2. Stable benign 16 mm left adrenal adenoma which requires no imaging follow-up. 3. Four-chamber cardiomegaly with small right-greater-than-left pleural effusions. 4.  Aortic Atherosclerosis (ICD10-I70.0). Electronically Signed   By: Dahlia Bailiff M.D.   On: 09/11/2021 13:20     Assessment and Plan:   EMMERSON TADDEI is a 71 y.o. male with a hx of HFrEF/NICM s/p ICD, nonobstructive CAD, persistent atrial fibrillation, gastric cancer/GIB, who is being seen 09/13/2021 for the evaluation of CHF at the request of Dr. Lorin Mercy.  HFrEF NICM S/p ICD --Presented with worsening orthopnea and PND as well as nonproductive cough.  Symptoms worsened this morning around 1:30 AM.  BNP 1233.  CT chest with pulmonary edema and small right pleural effusion. + JVD on exam.  He has been given IV Lasix in the ED, wife recorded 2.8 L of urine output documented thus far.  Does report improvement in his breathing. Reports no dietary indiscretion, or increased fluid intake.  Weights seem to have been stable at home. -- Continue IV Lasix 40 mg twice daily -- Continue Entresto. Last office note indicates he was on coreg 3.'125mg'$  BID but does not appear on his med list? Await med rec -- consider SGLT2i this admission  --  repeat echo pending  Persistent atrial fibrillation: Actually appears to be in atrial flutter on admission, controlled --Reports compliance with Xarelto PTA --Has historically been on sotalol 120 mg BID  Hx of Gastric cancer Hx of GI bleed --Followed by Dr. Burr Williams with oncology. Had chemo. Currently under surveillance monitoring. --Denies any recent GI bleeding, stable hemoglobin on admission at 11.9  Nonobstructive CAD: noted on cath 2018, hsTn negative x2  HLD: on statin  Risk Assessment/Risk Scores:   New York Heart Association (NYHA)  Functional Class NYHA Class III  CHA2DS2-VASc Score = 4   This indicates a 4.8% annual risk of stroke. The patient's score is based upon: CHF History: 1 HTN History: 1 Diabetes History: 1 Stroke History: 0 Vascular Disease History: 0 Age Score: 1 Gender Score: 0    For questions or updates, please contact [place] Please consult [email or web address] for contact info under    Signed, Reino Bellis, NP  09/13/2021 1:16 PM  Patient seen and examined with Reino Bellis NP.  Agree as above, with the following exceptions and changes as noted below. 71 yo male with nonsichemic CM, with known low ejection fraction. Presents with cough, SOB and chest tightness. Noted to have elevated BNP and pulmonary edema on CT. Has had good urine output thusfar. Gen: NAD, CV: RRR, no murmurs, Lungs: clear, Abd: soft, Extrem: Warm, well perfused, no edema, Neuro/Psych: alert and oriented x 3, normal mood and affect. All available labs, radiology testing, previous records reviewed. Echo shows stable low ejection fraction. Continue diuresis today. We discussed that he has had 2 hospitalizations for HF in the last 3 months. He may benefit from heart failure TOC follow up visit, he is not established with HF clinic but has really but overall stable with NICM over time. Etiology unclear and has ICD so MRI images may be nondiagnostic now with ICD. Will involved HF pharmacist for optimal medication review.  Elouise Munroe, MD 09/13/21 5:50 PM

## 2021-09-13 NOTE — ED Notes (Signed)
Lab called to add on mag.

## 2021-09-13 NOTE — H&P (Signed)
History and Physical    Patient: Jon Williams DOB: 1950-09-27 DOA: 09/13/2021 DOS: the patient was seen and examined on 09/13/2021 PCP: [name], MD  Patient coming from: Home - lives with wife, has a 3 day/week caregiver; NOK: Wife, [phone]   Chief Complaint: CP/SOB  HPI: Jon Williams is a 71 y.o. male with medical history significant of chronic combined CHF with AICD;  CAD; DM; afib; and prostate CA presenting with CP/SOB.  About 130AM, he noticed chest tightness and SOB.  He had mucus combing up.  His wife put another pillow under his head for orthopnea and "those sounds."  Discomfort was around his diaphragm "like a rubber band" from the back to the front, still present some. NTG made it feel some better.  AICD did not fire.  He had laid down about 10pm but did not sleep well prior to the event.  His breathing has been "different" with exercises periodically for about a week.  His shins feel tight and his lower legs have been cold.  Orthopnea for 2-3 days.  Dr. Lovena Le is cards, last saw in May and last echo was in February.  He has been diagnosed with gastric cancer, diagnosed in October.  No surgery, did chemo but had to stop due to side effects.  "Cancer free now".  He is in a wheelchair now and had severe issues from the chemo, last treatment 2/8.    ER Course:  Carryover, per Dr. Nevada Crane:  71 year old male with a history of atrial fibrillation on xarelto, nonobstructive coronary artery disease, nonischemic cardiomyopathy, chronic combined diastolic and systolic congestive heart failure, gastric cancer, anemia of chronic disease, who presents from home with sudden onset shortness of breath, chest tightness, and orthopnea.  Work-up in the ED reveals acute CHF, BNP greater than 1200 with pulmonary edema seen on CT scan.  IV diuresing started in the ED.     Review of Systems: As mentioned in the history of present illness. All other systems reviewed and are  negative. Past Medical History:  Diagnosis Date   AICD (automatic cardioverter/defibrillator) present 05/25/2016   biv icd   Bunion, right    Chronic combined systolic and diastolic CHF (congestive heart failure) ([place])    Echo 1/18: Mild conc LVH, EF 15-20, severe diff HK, inf and inf-septal AK, Gr 3 DD, mild to mod MR, severe LAE, mod reduced RVSF, mod RAE, mild TR, PASP 50   Coronary artery disease involving native coronary artery without angina pectoris 04/17/2016   LHC 1/18: pLCx 43, mLCx 20, mRCA 40, dRCA 20, LVEDP 23, mean RA 8, PA 42/20, PCWP 17   Diabetes mellitus without complication (HCC)    DJD (degenerative joint disease)    Gastric cancer (HCC)    History of atrial fibrillation    History of atrial flutter    History of cardiomegaly 06/07/2016   Noted on CXR   History of colon polyps 06/28/2017   Noted on colonoscopy   LBBB (left bundle branch block)    NICM (nonischemic cardiomyopathy) ([place])    Echo 1/18:  Mild conc LVH, EF 15-20, severe diff HK, inf and inf-septal AK, Gr 3 DD, mild to mod MR, severe LAE, mod reduced RVSF, mod RAE, mild TR, PASP 50   OA (osteoarthritis)    knee   Other secondary pulmonary hypertension ([place]) 04/17/2016   Prostate cancer ([place]) 2019   Sigmoid diverticulosis 06/28/2017   Noted on colonoscopy   Past Surgical History:  Procedure Laterality  Date   BIOPSY  12/01/2020   Procedure: BIOPSY;  Surgeon: [name], MD;  Location: [facility] ENDOSCOPY;  Service: Endoscopy;;   BIOPSY  06/09/2021   Procedure: BIOPSY;  Surgeon: [name], MD;  Location: [facility] ENDOSCOPY;  Service: Gastroenterology;;   BIV ICD INSERTION CRT-D N/A 05/25/2016   Procedure: BiV ICD Insertion CRT-D;  Surgeon: [name], MD;  Location: [place] [facility] LAB;  Service: Cardiovascular;  Laterality: N/A;   CARDIAC CATHETERIZATION N/A 03/02/2016   Procedure: Right/Left Heart Cath and Coronary Angiography;  Surgeon: Nelva Bush, MD;  Location: [place] [facility] LAB;  Service:  Cardiovascular;  Laterality: N/A;   CARDIOVERSION N/A 07/17/2016   Procedure: Cardioversion;  Surgeon: [name], MD;  Location: [place] [facility] LAB;  Service: Cardiovascular;  Laterality: N/A;   COLONOSCOPY WITH PROPOFOL N/A 06/28/2017   Procedure: COLONOSCOPY WITH PROPOFOL;  Surgeon: [name], MD;  Location: [facility] ENDOSCOPY;  Service: Endoscopy;  Laterality: N/A;   colonscopy  2009   ESOPHAGOGASTRODUODENOSCOPY N/A 12/01/2020   Procedure: ESOPHAGOGASTRODUODENOSCOPY (EGD);  Surgeon: [name], MD;  Location: [facility];  Service: Endoscopy;  Laterality: N/A;  IDA/guaiac positive stools   ESOPHAGOGASTRODUODENOSCOPY (EGD) WITH PROPOFOL N/A 12/16/2020   Procedure: ESOPHAGOGASTRODUODENOSCOPY (EGD) WITH PROPOFOL;  Surgeon: [name], MD;  Location: [facility] ENDOSCOPY;  Service: Endoscopy;  Laterality: N/A;   ESOPHAGOGASTRODUODENOSCOPY (EGD) WITH PROPOFOL N/A 06/09/2021   Procedure: ESOPHAGOGASTRODUODENOSCOPY (EGD) WITH PROPOFOL;  Surgeon: [name], MD;  Location: [facility] ENDOSCOPY;  Service: Gastroenterology;  Laterality: N/A;   GOLD SEED IMPLANT N/A 12/24/2017   Procedure: GOLD SEED IMPLANT, TRANSERINEAL;  Surgeon: Festus Aloe, MD;  Location: [facility] ORS;  Service: Urology;  Laterality: N/A;   INSERT / REPLACE / REMOVE PACEMAKER     LEAD REVISION  10/10/2018   LEAD REVISION/REPAIR N/A 10/10/2018   Procedure: LEAD REVISION/REPAIR;  Surgeon: [name], MD;  Location: [facility];  Service: Cardiovascular;  Laterality: N/A;   POLYPECTOMY  06/28/2017   Procedure: POLYPECTOMY;  Surgeon: [name], MD;  Location: [facility] ENDOSCOPY;  Service: Endoscopy;;  ascending and descending colon polyp   PORTACATH PLACEMENT Right 12/23/2020   Procedure: INSERTION PORT-A-CATH;  Surgeon: [name], MD;  Location: [place];  Service: General;  Laterality: Right;   PROSTATE BIOPSY  02/20/2017   SPACE OAR INSTILLATION N/A 12/24/2017   Procedure: SPACE OAR INSTILLATION;  Surgeon: Festus Aloe,  MD;  Location: [facility] OR[facility];  Service: Urology;  Laterality: N/A;   TOTAL KNEE ARTHROPLASTY Right 08/16/2017   Procedure: RIGHT TOTAL KNEE ARTHROPLASTY;  Surgeon: [name], MD;  Location: [facility];  Service: Orthopedics;  Laterality: Right;   UPPER ESOPHAGEAL ENDOSCOPIC ULTRASOUND (EUS) N/A 12/16/2020   Procedure: UPPER ESOPHAGEAL ENDOSCOPIC ULTRASOUND (EUS);  Surgeon: [name], MD;  Location: [facility];  Service: Endoscopy;  Laterality: N/A;   Social History:  reports that he has never smoked. He has never used smokeless tobacco. He reports that he does not drink alcohol and does not use drugs.  Allergies  Allergen Reactions   Aspirin Anaphylaxis and Hives   Sulfa Antibiotics Anaphylaxis, Hives, Swelling and Other (See Comments)    Swollen lips    Family History  Problem Relation Age of Onset   Hypertension Mother    Heart disease Mother    Diabetes Mother    Diabetes Father    Hypertension Father    Cancer Father        lung cancer   Healthy Sister    Heart attack Brother  Heart disease Brother 30       + tobacco   Healthy Brother     Prior to Admission medications   Medication Sig Start Date End Date Taking? Authorizing Provider  atorvastatin (LIPITOR) 40 MG tablet Take 1 tablet (40 mg total) by mouth daily. 04/24/21   [name], MD  Biotin 1000 MCG tablet Take 1,000 mcg by mouth daily with breakfast.    [provider]  cholecalciferol (VITAMIN D3) 25 MCG (1000 UT) tablet Take 1,000 Units by mouth daily with breakfast.    [provider]  docusate sodium (COLACE) 100 MG capsule Take 1 capsule (100 mg total) by mouth 2 (two) times daily as needed for mild constipation. 01/06/21   Davonna Belling, MD  furosemide (LASIX) 40 MG tablet Take 1/2 tablet by mouth daily, may take 1/2 tablet extra daily for fluid 06/07/21   Swinyer, Lanice Schwab, NP  gabapentin (NEURONTIN) 100 MG capsule TAKE 1 CAPSULE(100 MG) BY MOUTH THREE TIMES DAILY. START AT NIGHT, AND  GRADUALLY. INCREASE DOSE AS TOLERATES 08/07/21   Truitt Merle, MD  metFORMIN (GLUCOPHAGE-XR) 500 MG 24 hr tablet Take 500 mg by mouth every evening. 06/13/20   [provider]  metoprolol succinate (TOPROL-XL) 25 MG 24 hr tablet TAKE 1 TABLET(25 MG) BY MOUTH DAILY 06/08/21   Swinyer, Lanice Schwab, NP  multivitamin (ONE-A-DAY MEN'S) TABS tablet Take 1 tablet by mouth daily with breakfast.    [provider]  mupirocin ointment (BACTROBAN) 2 % Apply 1 application. topically daily. For wound care 05/05/21   Landis Martins, DPM  pantoprazole (PROTONIX) 40 MG tablet Take 1 tablet (40 mg total) by mouth 2 (two) times daily. 12/02/20   Aline August, MD  polyethylene glycol (MIRALAX / GLYCOLAX) 17 g packet Take 17 g by mouth daily as needed for moderate constipation. 01/06/21   Davonna Belling, MD  potassium chloride (KLOR-CON) 10 MEQ tablet Twice daily 05/31/21   Daleen Bo, MD  sacubitril-valsartan (ENTRESTO) 24-26 MG Take 1 tablet by mouth 2 (two) times daily. 05/16/21   [name], MD  sotalol (BETAPACE) 80 MG tablet Take 1.5 tablets (120 mg total) by mouth in the morning and at bedtime. 03/01/21   [name], MD  traMADol (ULTRAM) 50 MG tablet Take 1 tablet (50 mg total) by mouth every 12 (twelve) hours as needed. 04/21/21   Truitt Merle, MD  prochlorperazine (COMPAZINE) 10 MG tablet Take 1 tablet (10 mg total) by mouth every 6 (six) hours as needed (Nausea or vomiting). 02/15/21 05/12/21  Truitt Merle, MD    Physical Exam: Vitals:   09/13/21 0745 09/13/21 1045 09/13/21 1400 09/13/21 1503  BP: 120/81 119/88 106/62 103/65  Pulse: (!) 58 67 68 62  Resp: 20 (!) '28 12 13  '$ Temp:  97.9 F (36.6 C) 98 F (36.7 C) (!) 97.4 F (36.3 C)  TempSrc:  Oral Oral Oral  SpO2: 100% 99% 99% 100%  Weight:      Height:       General:  Appears chronically ill, pleasant, in NAD Eyes:  EOMI, normal lids, iris ENT:  grossly normal hearing, lips & tongue (geographic tongue), mmm Neck:  no LAD, masses  or thyromegaly Cardiovascular:  RRR, no m/r/g. No LE edema.  Respiratory:   CTA bilaterally with no wheezes/rales/rhonchi.  Mildly increased respiratory effort. Abdomen:  soft, NT, ND Skin:  no rash or induration seen on limited exam Musculoskeletal:  decreased strength BUE < BLE, no bony abnormality Psychiatric:  blunted  mood and affect, speech fluent and appropriate, AOx3 Neurologic:  CN 2-12 grossly intact, moves all extremities in coordinated fashion, sensation hypersensitive along B lower legs from chemo   Radiological Exams on Admission: Independently reviewed - see discussion in A/P where applicable  ECHOCARDIOGRAM COMPLETE  Result Date: 09/13/2021    ECHOCARDIOGRAM REPORT   Patient Name:   KEITON COSMA Date of Exam: 09/13/2021 Medical Rec #:  [id number]        Height:       66.0 in Accession #:    [id number]       Weight:       197.0 lb Date of Birth:  [birth year]         BSA:          1.987 m Patient Age:    71 years         BP:           120/81 mmHg Patient Gender: M                HR:           64 bpm. Exam Location:  Inpatient Procedure: 2D Echo, Cardiac Doppler and Color Doppler Indications:    CHF  History:        Patient has prior history of Echocardiogram examinations, most                 recent 04/02/2021. CAD; Risk Factors:Diabetes.  Sonographer:    Jefferey Pica Referring Phys: Karmen Bongo IMPRESSIONS  1. Left ventricular ejection fraction, by estimation, is 20 to 25%. The left ventricle has severely decreased function. The left ventricle demonstrates global hypokinesis. The left ventricular internal cavity size was moderately dilated. Left ventricular diastolic parameters are consistent with Grade III diastolic dysfunction (restrictive).  2. Right ventricular systolic function is mildly reduced. The right ventricular size is normal. There is moderately elevated pulmonary artery systolic pressure. The estimated right ventricular systolic pressure is 50.9 mmHg.  3. Left atrial  size was severely dilated.  4. The mitral valve is abnormal. Moderate to severe mitral valve regurgitation, likely functional mitral regurgitation with dilated annulus. No evidence of mitral stenosis.  5. Tricuspid valve regurgitation is mild to moderate.  6. The aortic valve is tricuspid. Aortic valve regurgitation is not visualized. No aortic stenosis is present.  7. The inferior vena cava is dilated in size with >50% respiratory variability, suggesting right atrial pressure of 8 mmHg. FINDINGS  Left Ventricle: Left ventricular ejection fraction, by estimation, is 20 to 25%. The left ventricle has severely decreased function. The left ventricle demonstrates global hypokinesis. The left ventricular internal cavity size was moderately dilated. There is no left ventricular hypertrophy. Left ventricular diastolic parameters are consistent with Grade III diastolic dysfunction (restrictive). Right Ventricle: The right ventricular size is normal. No increase in right ventricular wall thickness. Right ventricular systolic function is mildly reduced. There is moderately elevated pulmonary artery systolic pressure. The tricuspid regurgitant velocity is 3.06 m/s, and with an assumed right atrial pressure of 8 mmHg, the estimated right ventricular systolic pressure is 32.6 mmHg. Left Atrium: Left atrial size was severely dilated. Right Atrium: Right atrial size was normal in size. Pericardium: There is no evidence of pericardial effusion. Mitral Valve: The mitral valve is abnormal. Moderate to severe mitral valve regurgitation. No evidence of mitral valve stenosis. Tricuspid Valve: The tricuspid valve is normal in structure. Tricuspid valve regurgitation is mild to moderate. Aortic Valve: The aortic valve is tricuspid. Aortic valve  regurgitation is not visualized. No aortic stenosis is present. Aortic valve peak gradient measures 5.0 mmHg. Pulmonic Valve: The pulmonic valve was normal in structure. Pulmonic valve  regurgitation is trivial. Aorta: The aortic root is normal in size and structure. Venous: The inferior vena cava is dilated in size with greater than 50% respiratory variability, suggesting right atrial pressure of 8 mmHg. IAS/Shunts: No atrial level shunt detected by color flow Doppler. Additional Comments: A device lead is visualized in the right ventricle.  LEFT VENTRICLE PLAX 2D LVIDd:         6.85 cm LVIDs:         4.37 cm LV PW:         1.00 cm LV IVS:        1.15 cm LVOT diam:     2.00 cm LV SV:         34 LV SV Index:   17 LVOT Area:     3.14 cm  LV Volumes (MOD) LV vol d, MOD A4C: 326.0 ml LV vol s, MOD A4C: 245.0 ml LV SV MOD A4C:     326.0 ml RIGHT VENTRICLE            IVC RV Basal diam:  3.80 cm    IVC diam: 2.30 cm RV Mid diam:    2.90 cm RV S prime:     9.17 cm/s TAPSE (M-mode): 2.3 cm LEFT ATRIUM              Index         RIGHT ATRIUM           Index LA diam:        5.40 cm  2.72 cm/m    RA Area:     30.60 cm LA Vol (A2C):   138.0 ml 69.45 ml/m   RA Volume:   118.00 ml 59.39 ml/m LA Vol (A4C):   201.0 ml 101.16 ml/m LA Biplane Vol: 176.0 ml 88.58 ml/m  AORTIC VALVE                 PULMONIC VALVE AV Area (Vmax): 2.29 cm     PV Vmax:       0.53 m/s AV Vmax:        111.50 cm/s  PV Peak grad:  1.1 mmHg AV Peak Grad:   5.0 mmHg LVOT Vmax:      81.40 cm/s LVOT Vmean:     46.900 cm/s LVOT VTI:       0.107 m  AORTA Ao Root diam: 4.30 cm Ao Asc diam:  3.30 cm MITRAL VALVE               TRICUSPID VALVE MV Area (PHT): 6.02 cm    TR Peak grad:   37.5 mmHg MV Decel Time: 126 msec    TR Vmax:        306.00 cm/s MR Peak grad: 56.1 mmHg MR Vmax:      374.50 cm/s  SHUNTS MV E velocity: 92.50 cm/s  Systemic VTI:  0.11 m MV A velocity: 30.70 cm/s  Systemic Diam: 2.00 cm MV E/A ratio:  3.01 Dalton McleanMD Electronically signed by Franki Monte Signature Date/Time: 09/13/2021/4:39:29 PM    Final    CT Angio Chest PE W and/or Wo Contrast  Result Date: 09/13/2021 CLINICAL DATA:  Evaluate for pulmonary  embolism. High probability. Shortness of breath and chest tightness. EXAM: CT ANGIOGRAPHY CHEST WITH CONTRAST TECHNIQUE: Multidetector CT imaging of the chest was performed  using the standard protocol during bolus administration of intravenous contrast. Multiplanar CT image reconstructions and MIPs were obtained to evaluate the vascular anatomy. RADIATION DOSE REDUCTION: This exam was performed according to the departmental dose-optimization program which includes automated exposure control, adjustment of the mA and/or kV according to patient size and/or use of iterative reconstruction technique. CONTRAST:  31m OMNIPAQUE IOHEXOL 350 MG/ML SOLN COMPARISON:  09/11/2021 FINDINGS: Cardiovascular: Satisfactory opacification of the pulmonary arteries to the segmental level. No evidence of pulmonary embolism. The heart size is enlarged. There is a left chest wall AICD with leads in the right atrial appendage and right ventricle. No pericardial effusion. Mild aortic atherosclerosis and multi vessel coronary artery calcifications. Mediastinum/Nodes: No enlarged mediastinal, hilar, or axillary lymph nodes. Thyroid gland, trachea, and esophagus demonstrate no significant findings. Lungs/Pleura: There is a small right pleural effusion which appears similar to the previous exam. There is mild diffuse increase interlobular septal thickening. No airspace consolidation. No suspicious pulmonary nodule or mass. Upper Abdomen: There is reflux of contrast material into the IVC and hepatic veins compatible with right heart failure. Unchanged appearance of 1.6 cm left adrenal nodule compatible with a benign adenoma. No follow-up imaging recommended. Musculoskeletal: No chest wall abnormality. No acute or significant osseous findings. Review of the MIP images confirms the above findings. IMPRESSION: 1. No evidence for acute pulmonary embolus. 2. Cardiac enlargement, mild pulmonary edema and small right pleural effusion compatible with  CHF. 3. Aortic Atherosclerosis (ICD10-I70.0). Multi vessel coronary artery calcifications. Electronically Signed   By: TKerby MoorsM.D.   On: 09/13/2021 06:16   DG Chest 2 View  Result Date: 09/13/2021 CLINICAL DATA:  Shortness of breath and chest pain EXAM: CHEST - 2 VIEW COMPARISON:  04/10/2021 FINDINGS: Check shadow is enlarged. Defibrillator is again noted. Right chest wall port is again seen and stable. The lungs are clear. No bony abnormality is noted. IMPRESSION: No acute abnormality noted. Electronically Signed   By: MInez CatalinaM.D.   On: 09/13/2021 03:11    EKG: Independently reviewed.  Ventricular paced with rate 71   Labs on Admission: I have personally reviewed the available labs and imaging studies at the time of the admission.  Pertinent labs:    Glucose 120 AST 41/ALT 55 BNP 1233.3 HS troponin 5, 5 Unremarkable CBC   Assessment and Plan: Active Problems:   Acute on chronic combined systolic and diastolic CHF (congestive heart failure) (HCC)   Coronary artery disease involving native coronary artery without angina pectoris   Persistent atrial fibrillation (HCC)   ICD (implantable cardioverter-defibrillator) in place   Malignant [place] of [place]   G[place]   Essential hypertension   Dyslipidemia   DNR (do not resuscitate)    Acute on chronic combined CHF -Patient with known h/o chronic combined CHF presenting with worsening SOB and orthopnea -CXR consistent with mild pulmonary edema -Elevated BNP  -With elevated BNP and abnl CXR, acute decompensated CHF seems probable as diagnosis -Will admit, as per the Emergency HF Mortality Risk Grade.  -Will request echocardiogram -Continue Entresto, Sotalol; he also has been taking Toprol XL but will hold this since he is mildly bradycardic  -CHF order set utilized -Cardiology consulted -Was given Lasix 40 mg x 1 in ER and will repeat with 40 mg IV BID -Continue [place] O2 for now -Has ICD  HTN -As  noted above, continue Entresto and Sotalol but not also Toprol XL for now -Will also add prn hydralazine  HLD -Continue Lipitor  DM -Last A1c was 6.3, indicating good control -Hold metformin -Will cover with moderate-scale SSI for now  CAD -Mild to moderate disease on cath in 2018 -No current concern for ACS  Afib -Rate controlled with Sotalol, has AICD -Not on [facility] due to recent gastric cancer with bleeding  H/o prostate and gastric cancers -Remote prostate cancer -Gastric adenocarcinoma was diagnosed in 11/2020 -He was treated with FLOT4 but had neuropathy and diarrhea after cycle 6 and so it was discontinued -He is in surveillance mode for now with plan for restaging CT this month  DNR -I have discussed code status with the patient and his wife and they are in agreement that the patient would not desire resuscitation and would prefer to die a natural death should that situation arise. -He will need a gold out of facility DNR form at the time of discharge    Advance Care Planning:   Code Status: DNR   Consults: Cardiology; CHF navigator; PT/OT; nutrition; TOC team  DVT Prophylaxis: Lovenox  Family Communication: Wife was present throughout evaluation  Severity of Illness: The appropriate patient status for this patient is INPATIENT. Inpatient status is judged to be reasonable and necessary in order to provide the required intensity of service to ensure the patient's safety. The patient's presenting symptoms, physical exam findings, and initial radiographic and laboratory data in the context of their chronic comorbidities is felt to place them at high risk for further clinical deterioration. Furthermore, it is not anticipated that the patient will be medically stable for discharge from the hospital within 2 midnights of admission.   * I certify that at the point of admission it is my clinical judgment that the patient will require inpatient hospital care spanning beyond 2  midnights from the point of admission due to high intensity of service, high risk for further deterioration and high frequency of surveillance required.*  Author: Karmen Bongo, MD 09/13/2021 6:36 PM  For on call review [email or web address].

## 2021-09-13 NOTE — ED Notes (Signed)
Pt ambulating in hall with PT.

## 2021-09-13 NOTE — ED Triage Notes (Signed)
Pt arrives from home for shob w/ chest tightness that started around 0130, pt reports he couldn't sleep due to shob. Pt states the chest tightness is across the whole chest and worse w/ breathing. Pt endorses productive cough w/ white mucus. Hx cancer was taken off chemo 2 months ago

## 2021-09-13 NOTE — Progress Notes (Addendum)
Heart Failure Stewardship Pharmacist Progress Note   PCP: [name], MD PCP-Cardiologist: Mertie Moores, MD    HPI:  71 yo M with PMH of HFrEF s/p ICD, CAD, afib, gastric cancer, and GIB.   Sees Dr. Lovena Le as outpatient. ECHO 02/2016 with LVEF 15-20%. Had cath 2 weeks later that showed mild nonobstructive CAD. Repeat ECHO in March 2018 with EF stable 15%. Received BIV ICD with CRT-D on 05/25/16. Lead revision/repair in 2020.   Admitted 11/2020 with generalized weakness and fatigue and was found to have possible malignant gastric tumor. ECHO 12/07/20 with LVEF 20-25%. Discharged and followed up with oncology as outpatient.  Admitted 03/2021 with hypotension due to hypovolemia. ECHO 04/02/21 with LVEF 20-25%.  He presented to the ED on 8/2 with shortness of breath and chest discomfort. CXR without edema. CTA negative for PE but with mild pulmonary edema. ECHO pending.  Current HF Medications: Diuretic: furosemide 40 mg IV BID ACE/ARB/ARNI: Entresto 24/26 mg BID  Prior to admission HF Medications: Diuretic: furosemide 20 mg daily Beta blocker: metoprolol XL 25 mg daily ACE/ARB/ARNI: Entresto 24/26 mg BID  Pertinent Lab Values: Serum creatinine 0.99, BUN 14, Potassium 3.6, Sodium 139, BNP 1233.3, Magnesium 1.9  Ferritin 25 (7/31)  Vital Signs: Weight: 197 lbs (admission weight: 197 lbs) Blood pressure: 110-120/80s  Heart rate: 60s  I/O: -753m today  Medication Assistance / Insurance Benefits Check: Does the patient have prescription insurance?  Yes Type of insurance plan: [employer]  Does the patient qualify for medication assistance through manufacturers or grants?   Pending Eligible grants and/or patient assistance programs: pending Medication assistance applications in progress: none  Medication assistance applications approved: none Approved medication assistance renewals will be completed by: pending  Outpatient Pharmacy:  Prior to admission outpatient  pharmacy: [facility] the [facility]? Yes Is the patient willing to transition their outpatient pharmacy [facility]?   Pending    Assessment: 1. Acute on chronic systolic CHF (LVEF 208-65%, due to NICM. NYHA class III symptoms. - Continue furosemide 40 mg IV BID. Strict I/O. Daily weights. Goal K>4 and Mag>2. KCl 60 mEq x 1 given. Still needs magnesium replacement - recommend 2g IV x 1. - Consider resuming HF BB. Was taking metoprolol XL 25 mg daily PTA. Last filled 09/04/21 x 90 days. Also on sotalol for afib.  - Continue Entresto 24/26 mg BID - Consider starting spironolactone and SGLT2i prior to discharge (Jardiance preferred on insurance). Has taken Jardiance in the past. Stopped 11/2020 - had hypovolemia and low CBGs at this time with new gastric mass. - Repeat ECHO pending - Hold any IV iron infusions if cardiac MRI planned   Plan: 1) Medication changes recommended at this time: - Restart metoprolol XL 25 mg daily - Magnesium 2 g IV x 1  2) Patient assistance: -Delene Lollcopay $45 - Jardiance copay $45 - Farxiga copay $95 - Can help with patient assistance applications if copays are unaffordable  3)  Education  - To be completed prior to discharge  MKerby Nora PharmD, BCPS Heart Failure Stewardship Pharmacist Phone ([phone]

## 2021-09-14 ENCOUNTER — Inpatient Hospital Stay: Payer: Medicare HMO | Admitting: Hematology

## 2021-09-14 DIAGNOSIS — R079 Chest pain, unspecified: Secondary | ICD-10-CM | POA: Diagnosis not present

## 2021-09-14 DIAGNOSIS — E440 Moderate protein-calorie malnutrition: Secondary | ICD-10-CM | POA: Diagnosis not present

## 2021-09-14 DIAGNOSIS — E119 Type 2 diabetes mellitus without complications: Secondary | ICD-10-CM

## 2021-09-14 DIAGNOSIS — C61 Malignant neoplasm of prostate: Secondary | ICD-10-CM | POA: Diagnosis not present

## 2021-09-14 DIAGNOSIS — E785 Hyperlipidemia, unspecified: Secondary | ICD-10-CM | POA: Diagnosis not present

## 2021-09-14 DIAGNOSIS — Z95810 Presence of automatic (implantable) cardiac defibrillator: Secondary | ICD-10-CM | POA: Diagnosis not present

## 2021-09-14 DIAGNOSIS — I2510 Atherosclerotic heart disease of native coronary artery without angina pectoris: Secondary | ICD-10-CM | POA: Diagnosis not present

## 2021-09-14 DIAGNOSIS — I5043 Acute on chronic combined systolic (congestive) and diastolic (congestive) heart failure: Secondary | ICD-10-CM | POA: Diagnosis not present

## 2021-09-14 DIAGNOSIS — Z66 Do not resuscitate: Secondary | ICD-10-CM | POA: Diagnosis not present

## 2021-09-14 DIAGNOSIS — I4819 Other persistent atrial fibrillation: Secondary | ICD-10-CM | POA: Diagnosis not present

## 2021-09-14 LAB — BASIC METABOLIC PANEL
Anion gap: 7 (ref 5–15)
BUN: 15 mg/dL (ref 8–23)
CO2: 25 mmol/L (ref 22–32)
Calcium: 8.8 mg/dL — ABNORMAL LOW (ref 8.9–10.3)
Chloride: 106 mmol/L (ref 98–111)
Creatinine, Ser: 1.14 mg/dL (ref 0.61–1.24)
GFR, Estimated: >60 mL/min (ref >60)
Glucose, Bld: 106 mg/dL — ABNORMAL HIGH (ref 70–99)
Potassium: 3.7 mmol/L (ref 3.5–5.1)
Sodium: 138 mmol/L (ref 135–145)

## 2021-09-14 LAB — GLUCOSE, CAPILLARY
Glucose-Capillary: 100 mg/dL — ABNORMAL HIGH (ref 70–99)
Glucose-Capillary: 150 mg/dL — ABNORMAL HIGH (ref 70–99)
Glucose-Capillary: 215 mg/dL — ABNORMAL HIGH (ref 70–99)
Glucose-Capillary: 97 mg/dL (ref 70–99)

## 2021-09-14 LAB — CBC
HCT: 33.1 % — ABNORMAL LOW (ref 39.0–52.0)
Hemoglobin: 11 g/dL — ABNORMAL LOW (ref 13.0–17.0)
MCH: 31 pg (ref 26.0–34.0)
MCHC: 33.2 g/dL (ref 30.0–36.0)
MCV: 93.2 fL (ref 80.0–100.0)
Platelets: 151 10*3/uL (ref 150–400)
RBC: 3.55 MIL/uL — ABNORMAL LOW (ref 4.22–5.81)
RDW: 15.1 % (ref 11.5–15.5)
WBC: 4.6 10*3/uL (ref 4.0–10.5)
nRBC: 0 % (ref 0.0–0.2)

## 2021-09-14 MED ORDER — RIVAROXABAN 20 MG PO TABS
20.0000 mg | ORAL_TABLET | Freq: Every day | ORAL | Status: DC
  Administered 2021-09-14: 20 mg via ORAL
  Filled 2021-09-14: qty 1

## 2021-09-14 MED ORDER — POTASSIUM CHLORIDE CRYS ER 20 MEQ PO TBCR
60.0000 meq | EXTENDED_RELEASE_TABLET | Freq: Once | ORAL | Status: AC
Start: 2021-09-14 — End: 2021-09-14
  Administered 2021-09-14: 60 meq via ORAL
  Filled 2021-09-14: qty 3

## 2021-09-14 MED ORDER — ENSURE ENLIVE PO LIQD
237.0000 mL | Freq: Two times a day (BID) | ORAL | Status: DC
  Administered 2021-09-14 – 2021-09-15 (×3): 237 mL via ORAL

## 2021-09-14 NOTE — Progress Notes (Signed)
Heart Failure Navigator Progress Note  Assessed for Heart & Vascular TOC clinic readiness.   Patient scheduled for 8/15 @ [place], PharmD, BCPS Heart Failure Stewardship Pharmacist Phone [phone]

## 2021-09-14 NOTE — Progress Notes (Signed)
The patient received a ReDS Clip reading =40%

## 2021-09-14 NOTE — Progress Notes (Signed)
Initial Nutrition Assessment  DOCUMENTATION CODES:   Non-severe (moderate) malnutrition in context of chronic illness  INTERVENTION:  Liberalize diet from a heart healthy/carb modified/2 gram sodium to a 2 gram sodium diet to provide widest variety of menu options to enhance nutritional adequacy Ensure Enlive po BID, each supplement provides 350 kcal and 20 grams of protein. "High Calorie, High Protein Nutrition Therapy" handout added to AVS  NUTRITION DIAGNOSIS:   Moderate Malnutrition related to chronic illness (gastric cancer, CHF) as evidenced by mild fat depletion, mild muscle depletion  GOAL:   Patient will meet greater than or equal to 90% of their needs  MONITOR:   PO intake, Supplement acceptance, Diet advancement, Labs, Weight trends, I & O's  REASON FOR ASSESSMENT:   Consult Other (Comment) (nutrition goals)  ASSESSMENT:   Pt admitted from home with CP/SOB. PMH significant for chronic combined CHF with AICD, CAD, DM, afib, prostate CA, diagnosis of gastric cancer in October s/p chemotherapy.  Pt just returning from walking hallway at time of visit with his wife. He had previously been on chemotherapy and was followed by outpatient [facility]it[facility] at the [facility]. He states that he had a terrible reaction to the chemo (taste changes, all of his nails fell off and decreases/changes in hand grip) and had to stop treatment. He reports trying to maintain a healthy diet although his wife states that he often comes back for second helpings. He recalls eating oatmeal and cereal for breakfast; a fruit for a snack (banana, blueberries, oranges or cherries); broccoli and chicken or salmon for lunch; fruit for an afternoon snack; dinner may include zucchini, salad, low sodium soup or grilled cheese. He occasionally enjoys a dessert 1-2 times per week which may include an apple dump cake with some ice cream. His beverage intake includes ~5 12oz Gatorades daily plus a few cups of water.    Pt reports that his wt has remained around 96-97 kg and denies recent wt loss. Per review of wt history, it appears that overall his wt has decreased within the last year however noted lots of up and down fluctuations. Given h/o CHF this could likely be contributing to some wt fluctuations but could also be r/t h/o gastric cancer and chemotherapy. Current admit wt noted to be 83.9 kg.   Encouraged adequate nutritional intake with emphasis on protein to help maintain muscle mass. Pt noted to have been drinking nutrition supplements previously. Will order these supplements and encouraged him to continue at home to increase his protein intake. Also briefly discussed sodium content of Gatorade, encouraged him to be mindful of sodium content of these drinks.   Edema: non-pitting BLE  Medications: colace, lasix, SSI 0-15 units TID, SSI 0-5 units QHS  Labs: CBG's 100-134 x24 hours, HgbA1c 6.3%  UOP: 2.7L x24 hours + 575m x12 hours I/O's: -2.9L since admission  NUTRITION - FOCUSED PHYSICAL EXAM:  Flowsheet Row Most Recent Value  Orbital Region Mild depletion  Upper Arm Region Mild depletion  Thoracic and Lumbar Region No depletion  Buccal Region No depletion  Temple Region Mild depletion  Clavicle Bone Region Mild depletion  Clavicle and Acromion Bone Region No depletion  Scapular Bone Region No depletion  Dorsal Hand No depletion  Patellar Region No depletion  Anterior Thigh Region No depletion  Posterior Calf Region No depletion  Edema (RD Assessment) None  Hair Reviewed  Eyes Reviewed  Mouth Reviewed  Skin Reviewed  Nails Other (Comment)  [thick, black nails L>R]  Diet Order:   Diet Order             Diet 2 gram sodium Room service appropriate? Yes; Fluid consistency: Thin; Fluid restriction: 1500 mL Fluid  Diet effective now                   EDUCATION NEEDS:   Education needs have been addressed  Skin:  Skin Assessment: Reviewed RN Assessment  Last BM:   8/1  Height:   Ht Readings from Last 1 Encounters:  09/13/21 '5\' 6"'$  (1.676 m)    Weight:   Wt Readings from Last 1 Encounters:  09/14/21 83.9 kg   BMI:  Body mass index is 29.85 kg/m.  Estimated Nutritional Needs:   Kcal:  2000-2200  Protein:  100-115g  Fluid:  >/=2L  Clayborne Dana, RDN, LDN Clinical Nutrition

## 2021-09-14 NOTE — Progress Notes (Addendum)
[name]  Patient: Jon Williams     WUJ:[id number]  DOA: 09/13/2021   PCP: [name], MD       Brief hospital course: This is a 71 year old male with combined chronic heart failure with an AICD, coronary artery disease, diabetes mellitus, atrial fibrillation, prostate cancer who presented to the hospital for chest tightness and cough.  He took a nitroglycerin and it helped him feel better.  He also complained of orthopnea for the past 2 to 3 days. In the ED he was treated with IV Lasix for acute congestive heart failure and cardiology was consulted.  Subjective:  He has not had further chest pain. Had mild dyspnea on exertion.  Assessment and Plan: Principal Problem:   Acute on chronic combined systolic and diastolic CHF  Persistent atrial fibrillation (HCC)   ICD (implantable cardioverter-defibrillator) in place   Coronary artery disease involving native coronary artery without angina pectoris -Troponin > 5 and again 5 -Echo shows an EF of 20 to 95%, grade 3 diastolic dysfunction and reduced RV function - REDS reading is 40% today (elevated) -Continue management per cardiology would like to continue with diuresis with IV Lasix today - Continue Entresto, metoprolol, Betapace - Continue Lipitor and Xarelto  Active Problems:      Malignant neoplasm of prostate ([place]) - Follows with Dr.Feng - no longer on treatment    Gastric cancer ([place]) - In remission-on surveillance    DNR (do not resuscitate)     DVT prophylaxis: Eliquis   Code Status: DNR  Consultants: Cardiology Level of Care: Level of care: Telemetry Cardiac Disposition Plan:  Status is: Inpatient Remains inpatient appropriate because: IV diuretics for acute combined heart failure  Objective:   Vitals:   09/14/21 0642 09/14/21 1107 09/14/21 1426 09/14/21 1607  BP:  90/75  100/66  Pulse:  60  68  Resp:  17  17  Temp:  (!) 97.4 F (36.3 C)  (!) 97.4 F (36.3 C)  TempSrc:   Oral  Oral  SpO2:  98% 90% 98%  Weight: 83.9 kg     Height:       Filed Weights   09/13/21 0718 09/14/21 0500 09/14/21 0642  Weight: 89.4 kg 89.5 kg 83.9 kg   Exam: General exam: Appears comfortable  HEENT: PERRLA, oral mucosa moist, no sclera icterus or thrush Respiratory system: Faint crackles at bilateral bases-respiratory effort normal. Cardiovascular system: S1 & S2 heard, regular rate and rhythm Gastrointestinal system: Abdomen soft, non-tender, nondistended. Normal bowel sounds   Central nervous system: Alert and oriented. No focal neurological deficits. Extremities: No cyanosis, clubbing or edema Skin: No rashes or ulcers Psychiatry:  Mood & affect appropriate.    Imaging and lab data was personally reviewed    CBC: Recent Labs  Lab 09/11/21 1024 09/13/21 0246 09/14/21 0233  WBC 6.5 5.4 4.6  NEUTROABS 4.9  --   --   HGB 11.8* 11.9* 11.0*  HCT 35.0* 36.1* 33.1*  MCV 93.8 96.5 93.2  PLT 163 164 621   Basic Metabolic Panel: Recent Labs  Lab 09/11/21 1024 09/13/21 0246 09/13/21 0454 09/14/21 0233  NA 142 139  --  138  K 3.8 3.6  --  3.7  CL 110 110  --  106  CO2 27 22  --  25  GLUCOSE 114* 120*  --  106*  BUN 21 14  --  15  CREATININE 1.04 0.99  --  1.14  CALCIUM 9.0 9.0  --  8.8*  MG  --   --  1.9  --    GFR: Estimated Creatinine Clearance: 59.5 mL/min (by C-G formula based on SCr of 1.14 mg/dL).  Scheduled Meds:  atorvastatin  40 mg Oral Daily   docusate sodium  100 mg Oral BID   feeding supplement  237 mL Oral BID BM   furosemide  40 mg Intravenous BID   gabapentin  100 mg Oral BID   insulin aspart  0-15 Units Subcutaneous TID WC   insulin aspart  0-5 Units Subcutaneous QHS   metoprolol succinate  25 mg Oral Daily   pantoprazole  40 mg Oral BID   rivaroxaban  20 mg Oral Q supper   sacubitril-valsartan  1 tablet Oral BID   sodium chloride flush  3 mL Intravenous Q12H   sotalol  120 mg Oral Q12H   Continuous Infusions:   LOS: 1 day    Author: Debbe Odea  09/14/2021 4:18 PM

## 2021-09-14 NOTE — Evaluation (Signed)
Occupational Therapy Evaluation Patient Details Name: Jon Williams MRN: [id number] DOB: January 06, 1951 Today's Date: 09/14/2021   History of Present Illness 71 yo male with onset of SOB was admitted on 8/2 for managment of his symptoms.  Was noted to have CHF, cleared for PE but has mild pulm edema.  Had a recent bout of chemo for his gastric tumor, then home therapy for generalized weakness.  PMHx:  ICD, CAD, a-fib, gastric CA, GI bleed, CHF, hypotension, LVEF 20-25%,   Clinical Impression   PTA patient reports needing some assist for ADLs due to lack of coordination and sensation in hands, using rollator for mobility.  Pt admitted for above and presents with problem list below, including impaired BUE strength, coordination and sensation, decreased activity tolerance and impaired balance.  Pt completing transfers with supervision using RW, functional mobility with min guard using RW, and Adls with min to mod assist.  Issued built up handles for self feeding independence, would benefit from handled cup.  Will follow acutely  to optimize independence and safety for return home.  Recommend OP OT follow up at dc.        Recommendations for follow up therapy are one component of a multi-disciplinary discharge planning process, led by the attending physician.  Recommendations may be updated based on patient status, additional functional criteria and insurance authorization.   Follow Up Recommendations  Outpatient OT    Assistance Recommended at Discharge Intermittent Supervision/Assistance  Patient can return home with the following A little help with walking and/or transfers;A little help with bathing/dressing/bathroom;Assistance with cooking/housework    Functional Status Assessment  Patient has had a recent decline in their functional status and demonstrates the ability to make significant improvements in function in a reasonable and predictable amount of time.  Equipment Recommendations  None  recommended by OT    Recommendations for Other Services       Precautions / Restrictions Precautions Precautions: Fall Precaution Comments: monitor O2 sats and HR Restrictions Weight Bearing Restrictions: No      Mobility Bed Mobility Overal bed mobility: Modified Independent                  Transfers Overall transfer level: Needs assistance Equipment used: Rolling walker (2 wheels) Transfers: Sit to/from Stand Sit to Stand: Supervision           General transfer comment: supervision for safety      Balance Overall balance assessment: Needs assistance Sitting-balance support: Feet supported Sitting balance-Leahy Scale: Good     Standing balance support: Bilateral upper extremity supported, During functional activity Standing balance-Leahy Scale: Fair                             ADL either performed or assessed with clinical judgement   ADL Overall ADL's : Needs assistance/impaired Eating/Feeding: Sitting;Set up Eating/Feeding Details (indicate cue type and reason): issued built up handles Grooming: Minimal assistance;Sitting           Upper Body Dressing : Minimal assistance;Sitting   Lower Body Dressing: Sit to/from stand;Moderate assistance Lower Body Dressing Details (indicate cue type and reason): assist for socks today, supervision sit to stand Toilet Transfer: Supervision/safety;Ambulation;Rolling walker (2 wheels) Toilet Transfer Details (indicate cue type and reason): simulated         Functional mobility during ADLs: Supervision/safety;Rolling walker (2 wheels)       Vision   Vision Assessment?: No apparent visual deficits  Perception     Praxis      Pertinent Vitals/Pain Pain Assessment Pain Assessment: No/denies pain     Hand Dominance Right   Extremity/Trunk Assessment Upper Extremity Assessment Upper Extremity Assessment: RUE deficits/detail;LUE deficits/detail RUE Deficits / Details: impaired FMC  and sensation since chemo RUE Sensation: decreased light touch;decreased proprioception RUE Coordination: decreased fine motor LUE Deficits / Details: impaired FMC and sensation since chemo LUE Sensation: decreased light touch;decreased proprioception LUE Coordination: decreased fine motor   Lower Extremity Assessment Lower Extremity Assessment: Defer to PT evaluation   Cervical / Trunk Assessment Cervical / Trunk Assessment: Normal   Communication Communication Communication: No difficulties   Cognition Arousal/Alertness: Awake/alert Behavior During Therapy: WFL for tasks assessed/performed Overall Cognitive Status: Within Functional Limits for tasks assessed                                       General Comments  spouse present and supportive, pt voiced frustration with decreased coordination in his hands    Exercises     Shoulder Instructions      Home Living Family/patient expects to be discharged to:: Private residence Living Arrangements: Spouse/significant other Available Help at Discharge: Family;Available 24 hours/day Type of Home: House Home Access: Stairs to enter [employer] of Steps: 4 Entrance Stairs-Rails: Right;Left;Can reach both Home Layout: One level;Other (Comment) (2 steps to laundry)     Bathroom Shower/Tub: Occupational psychologist: Handicapped height     Home Equipment: Rollator (4 wheels);Shower seat          Prior Functioning/Environment Prior Level of Function : Independent/Modified Independent             Mobility Comments: using rollator          OT Problem List: Decreased strength;Impaired balance (sitting and/or standing);Decreased coordination;Decreased knowledge of use of DME or AE;Decreased knowledge of precautions;Impaired UE functional use;Impaired sensation      OT Treatment/Interventions: Self-care/ADL training;Neuromuscular education;DME and/or AE instruction;Therapeutic  activities;Therapeutic exercise;Patient/family education;Balance training    OT Goals(Current goals can be found in the care plan section) Acute Rehab OT Goals Patient Stated Goal: get better control of my hands OT Goal Formulation: With patient Time For Goal Achievement: 09/28/21 Potential to Achieve Goals: Good  OT Frequency: Min 2X/week    Co-evaluation              AM-PAC OT "6 Clicks" Daily Activity     Outcome Measure Help from another person eating meals?: A Little Help from another person taking care of personal grooming?: A Little Help from another person toileting, which includes using toliet, bedpan, or urinal?: A Little Help from another person bathing (including washing, rinsing, drying)?: A Little Help from another person to put on and taking off regular upper body clothing?: A Little Help from another person to put on and taking off regular lower body clothing?: A Lot 6 Click Score: 17   End of Session Equipment Utilized During Treatment: Rolling walker (2 wheels) Nurse Communication: Mobility status  Activity Tolerance: Patient tolerated treatment well Patient left: with call bell/phone within reach;with family/visitor present;Other (comment) (sitting EOB)  OT Visit Diagnosis: Other abnormalities of gait and mobility (R26.89);Muscle weakness (generalized) (M62.81);Other symptoms and signs involving the nervous system (B15.176)                Time: 1607-3710 OT Time Calculation (min): 31 min Charges:  OT  General Charges $OT Visit: 1 Visit OT Evaluation $OT Eval Moderate Complexity: 1 Mod OT Treatments $Self Care/Home Management : 8-22 mins  Jolaine Artist, OT Acute Rehabilitation Services Office [phone]   Delight Stare 09/14/2021, 2:03 PM

## 2021-09-14 NOTE — Plan of Care (Signed)

## 2021-09-14 NOTE — Progress Notes (Addendum)
Progress Note  Patient Name: Jon Williams Date of Encounter: 09/14/2021  [facility] Cardiologist: Mertie Moores, MD   Subjective   No significant overnight events. Patient still having some shortness of breath but much better than yesterday. He was able to ambulate this morning and states he did pretty well with this. No chest pain.  Inpatient Medications    Scheduled Meds:  atorvastatin  40 mg Oral Daily   docusate sodium  100 mg Oral BID   enoxaparin (LOVENOX) injection  40 mg Subcutaneous Q24H   feeding supplement  237 mL Oral BID BM   furosemide  40 mg Intravenous BID   gabapentin  100 mg Oral BID   insulin aspart  0-15 Units Subcutaneous TID WC   insulin aspart  0-5 Units Subcutaneous QHS   metoprolol succinate  25 mg Oral Daily   pantoprazole  40 mg Oral BID   sacubitril-valsartan  1 tablet Oral BID   sodium chloride flush  3 mL Intravenous Q12H   sotalol  120 mg Oral Q12H   Continuous Infusions:  PRN Meds: acetaminophen **OR** acetaminophen, bisacodyl, hydrALAZINE, nitroGLYCERIN, ondansetron **OR** ondansetron (ZOFRAN) IV, polyethylene glycol, traZODone   Vital Signs    Vitals:   09/14/21 0037 09/14/21 0500 09/14/21 0642 09/14/21 1107  BP: 100/70 108/75  90/75  Pulse: 61 64  60  Resp: '20 20  17  '$ Temp: 98.1 F (36.7 C) 98 F (36.7 C)  (!) 97.4 F (36.3 C)  TempSrc: Oral Oral  Oral  SpO2: 98% 97%  98%  Weight:  89.5 kg 83.9 kg   Height:        Intake/Output Summary (Last 24 hours) at 09/14/2021 1229 Last data filed at 09/14/2021 1200 Gross per 24 hour  Intake 300 ml  Output 2450 ml  Net -2150 ml      09/14/2021    6:42 AM 09/14/2021    5:00 AM 09/13/2021    7:18 AM  Last 3 Weights  Weight (lbs) 184 lb 15.5 oz 197 lb 5 oz 197 lb  Weight (kg) 83.9 kg 89.5 kg 89.359 kg      Telemetry    V paced rhythm with occasional PVCs. - Personally Reviewed  ECG    No new ECG tracing today. - Personally Reviewed  Physical Exam   GEN: African-American  male resting comfortably in no acute distress.   Neck: No JVD. Cardiac: RRR. No significant murmurs, rubs, or gallops.  Respiratory: Clear to auscultation bilaterally. No wheezes, rhonchi, or rales. GI: Soft, non-distended, and non-tender. MS: No lower extremity edema. No deformity. Neuro:  No focal deficits. Psych: Normal affect. Responds appropriately.  Labs    High Sensitivity Troponin:   Recent Labs  Lab 09/13/21 0246 09/13/21 0454  TROPONINIHS 5 5     Chemistry Recent Labs  Lab 09/11/21 1024 09/13/21 0246 09/13/21 0454 09/14/21 0233  NA 142 139  --  138  K 3.8 3.6  --  3.7  CL 110 110  --  106  CO2 27 22  --  25  GLUCOSE 114* 120*  --  106*  BUN 21 14  --  15  CREATININE 1.04 0.99  --  1.14  CALCIUM 9.0 9.0  --  8.8*  MG  --   --  1.9  --   PROT 6.8 6.4*  --   --   ALBUMIN 4.3 3.9  --   --   AST 22 41  --   --  ALT 35 55*  --   --   ALKPHOS 56 62  --   --   BILITOT 1.3* 1.0  --   --   GFRNONAA >60 >60  --  >60  ANIONGAP 5 7  --  7    Lipids No results for input(s): "CHOL", "TRIG", "HDL", "LABVLDL", "LDLCALC", "CHOLHDL" in the last 168 hours.  Hematology Recent Labs  Lab 09/11/21 1024 09/13/21 0246 09/14/21 0233  WBC 6.5 5.4 4.6  RBC 3.73* 3.74* 3.55*  HGB 11.8* 11.9* 11.0*  HCT 35.0* 36.1* 33.1*  MCV 93.8 96.5 93.2  MCH 31.6 31.8 31.0  MCHC 33.7 33.0 33.2  RDW 15.1 15.2 15.1  PLT 163 164 151   Thyroid No results for input(s): "TSH", "FREET4" in the last 168 hours.  BNP Recent Labs  Lab 09/13/21 0246  BNP 1,233.3*    DDimer No results for input(s): "DDIMER" in the last 168 hours.   Radiology    ECHOCARDIOGRAM COMPLETE  Result Date: 09/13/2021    ECHOCARDIOGRAM REPORT   Patient Name:   KARLIS CREGG Date of Exam: 09/13/2021 Medical Rec #:  [id number]        Height:       66.0 in Accession #:    [id number]       Weight:       197.0 lb Date of Birth:  1950/11/27         BSA:          1.987 m Patient Age:    59 years         BP:           120/81  mmHg Patient Gender: M                HR:           64 bpm. Exam Location:  Inpatient Procedure: 2D Echo, Cardiac Doppler and Color Doppler Indications:    CHF  History:        Patient has prior history of Echocardiogram examinations, most                 recent 04/02/2021. CAD; Risk Factors:Diabetes.  Sonographer:    Jefferey Pica Referring Phys: Karmen Bongo IMPRESSIONS  1. Left ventricular ejection fraction, by estimation, is 20 to 25%. The left ventricle has severely decreased function. The left ventricle demonstrates global hypokinesis. The left ventricular internal cavity size was moderately dilated. Left ventricular diastolic parameters are consistent with Grade III diastolic dysfunction (restrictive).  2. Right ventricular systolic function is mildly reduced. The right ventricular size is normal. There is moderately elevated pulmonary artery systolic pressure. The estimated right ventricular systolic pressure is 51.8 mmHg.  3. Left atrial size was severely dilated.  4. The mitral valve is abnormal. Moderate to severe mitral valve regurgitation, likely functional mitral regurgitation with dilated annulus. No evidence of mitral stenosis.  5. Tricuspid valve regurgitation is mild to moderate.  6. The aortic valve is tricuspid. Aortic valve regurgitation is not visualized. No aortic stenosis is present.  7. The inferior vena cava is dilated in size with >50% respiratory variability, suggesting right atrial pressure of 8 mmHg. FINDINGS  Left Ventricle: Left ventricular ejection fraction, by estimation, is 20 to 25%. The left ventricle has severely decreased function. The left ventricle demonstrates global hypokinesis. The left ventricular internal cavity size was moderately dilated. There is no left ventricular hypertrophy. Left ventricular diastolic parameters are consistent with Grade III diastolic dysfunction (restrictive). Right Ventricle: The  right ventricular size is normal. No increase in right  ventricular wall thickness. Right ventricular systolic function is mildly reduced. There is moderately elevated pulmonary artery systolic pressure. The tricuspid regurgitant velocity is 3.06 m/s, and with an assumed right atrial pressure of 8 mmHg, the estimated right ventricular systolic pressure is 12.7 mmHg. Left Atrium: Left atrial size was severely dilated. Right Atrium: Right atrial size was normal in size. Pericardium: There is no evidence of pericardial effusion. Mitral Valve: The mitral valve is abnormal. Moderate to severe mitral valve regurgitation. No evidence of mitral valve stenosis. Tricuspid Valve: The tricuspid valve is normal in structure. Tricuspid valve regurgitation is mild to moderate. Aortic Valve: The aortic valve is tricuspid. Aortic valve regurgitation is not visualized. No aortic stenosis is present. Aortic valve peak gradient measures 5.0 mmHg. Pulmonic Valve: The pulmonic valve was normal in structure. Pulmonic valve regurgitation is trivial. Aorta: The aortic root is normal in size and structure. Venous: The inferior vena cava is dilated in size with greater than 50% respiratory variability, suggesting right atrial pressure of 8 mmHg. IAS/Shunts: No atrial level shunt detected by color flow Doppler. Additional Comments: A device lead is visualized in the right ventricle.  LEFT VENTRICLE PLAX 2D LVIDd:         6.85 cm LVIDs:         4.37 cm LV PW:         1.00 cm LV IVS:        1.15 cm LVOT diam:     2.00 cm LV SV:         34 LV SV Index:   17 LVOT Area:     3.14 cm  LV Volumes (MOD) LV vol d, MOD A4C: 326.0 ml LV vol s, MOD A4C: 245.0 ml LV SV MOD A4C:     326.0 ml RIGHT VENTRICLE            IVC RV Basal diam:  3.80 cm    IVC diam: 2.30 cm RV Mid diam:    2.90 cm RV S prime:     9.17 cm/s TAPSE (M-mode): 2.3 cm LEFT ATRIUM              Index         RIGHT ATRIUM           Index LA diam:        5.40 cm  2.72 cm/m    RA Area:     30.60 cm LA Vol (A2C):   138.0 ml 69.45 ml/m   RA  Volume:   118.00 ml 59.39 ml/m LA Vol (A4C):   201.0 ml 101.16 ml/m LA Biplane Vol: 176.0 ml 88.58 ml/m  AORTIC VALVE                 PULMONIC VALVE AV Area (Vmax): 2.29 cm     PV Vmax:       0.53 m/s AV Vmax:        111.50 cm/s  PV Peak grad:  1.1 mmHg AV Peak Grad:   5.0 mmHg LVOT Vmax:      81.40 cm/s LVOT Vmean:     46.900 cm/s LVOT VTI:       0.107 m  AORTA Ao Root diam: 4.30 cm Ao Asc diam:  3.30 cm MITRAL VALVE               TRICUSPID VALVE MV Area (PHT): 6.02 cm    TR Peak grad:   37.5  mmHg MV Decel Time: 126 msec    TR Vmax:        306.00 cm/s MR Peak grad: 56.1 mmHg MR Vmax:      374.50 cm/s  SHUNTS MV E velocity: 92.50 cm/s  Systemic VTI:  0.11 m MV A velocity: 30.70 cm/s  Systemic Diam: 2.00 cm MV E/A ratio:  3.01 Dalton McleanMD Electronically signed by Franki Monte Signature Date/Time: 09/13/2021/4:39:29 PM    Final    CT Angio Chest PE W and/or Wo Contrast  Result Date: 09/13/2021 CLINICAL DATA:  Evaluate for pulmonary embolism. High probability. Shortness of breath and chest tightness. EXAM: CT ANGIOGRAPHY CHEST WITH CONTRAST TECHNIQUE: Multidetector CT imaging of the chest was performed using the standard protocol during bolus administration of intravenous contrast. Multiplanar CT image reconstructions and MIPs were obtained to evaluate the vascular anatomy. RADIATION DOSE REDUCTION: This exam was performed according to the departmental dose-optimization program which includes automated exposure control, adjustment of the mA and/or kV according to patient size and/or use of iterative reconstruction technique. CONTRAST:  31m OMNIPAQUE IOHEXOL 350 MG/ML SOLN COMPARISON:  09/11/2021 FINDINGS: Cardiovascular: Satisfactory opacification of the pulmonary arteries to the segmental level. No evidence of pulmonary embolism. The heart size is enlarged. There is a left chest wall AICD with leads in the right atrial appendage and right ventricle. No pericardial effusion. Mild aortic atherosclerosis  and multi vessel coronary artery calcifications. Mediastinum/Nodes: No enlarged mediastinal, hilar, or axillary lymph nodes. Thyroid gland, trachea, and esophagus demonstrate no significant findings. Lungs/Pleura: There is a small right pleural effusion which appears similar to the previous exam. There is mild diffuse increase interlobular septal thickening. No airspace consolidation. No suspicious pulmonary nodule or mass. Upper Abdomen: There is reflux of contrast material into the IVC and hepatic veins compatible with right heart failure. Unchanged appearance of 1.6 cm left adrenal nodule compatible with a benign adenoma. No follow-up imaging recommended. Musculoskeletal: No chest wall abnormality. No acute or significant osseous findings. Review of the MIP images confirms the above findings. IMPRESSION: 1. No evidence for acute pulmonary embolus. 2. Cardiac enlargement, mild pulmonary edema and small right pleural effusion compatible with CHF. 3. Aortic Atherosclerosis (ICD10-I70.0). Multi vessel coronary artery calcifications. Electronically Signed   By: TKerby MoorsM.D.   On: 09/13/2021 06:16   DG Chest 2 View  Result Date: 09/13/2021 CLINICAL DATA:  Shortness of breath and chest pain EXAM: CHEST - 2 VIEW COMPARISON:  04/10/2021 FINDINGS: Check shadow is enlarged. Defibrillator is again noted. Right chest wall port is again seen and stable. The lungs are clear. No bony abnormality is noted. IMPRESSION: No acute abnormality noted. Electronically Signed   By: MInez CatalinaM.D.   On: 09/13/2021 03:11    Cardiac Studies   Echocardiogram 09/13/2021: Impressions: 1. Left ventricular ejection fraction, by estimation, is 20 to 25%. The  left ventricle has severely decreased function. The left ventricle  demonstrates global hypokinesis. The left ventricular internal cavity size  was moderately dilated. Left  ventricular diastolic parameters are consistent with Grade III diastolic  dysfunction  (restrictive).   2. Right ventricular systolic function is mildly reduced. The right  ventricular size is normal. There is moderately elevated pulmonary artery  systolic pressure. The estimated right ventricular systolic pressure is  425.3mmHg.   3. Left atrial size was severely dilated.   4. The mitral valve is abnormal. Moderate to severe mitral valve  regurgitation, likely functional mitral regurgitation with dilated  annulus. No evidence of mitral stenosis.  5. Tricuspid valve regurgitation is mild to moderate.   6. The aortic valve is tricuspid. Aortic valve regurgitation is not  visualized. No aortic stenosis is present.   7. The inferior vena cava is dilated in size with >50% respiratory  variability, suggesting right atrial pressure of 8 mmHg.   Patient Profile     71 y.o. male with a history of non-obstructive CAD on cardiac catheterization in 2018, non-ischemic cardiomyopathy/ chronic combined CHF with EF of 20-25% on Echo in 03/2021 s/p ICD in 05/2016, persistent atrial fibrillation/flutter on Xarelto, LBBB,  gastric cancer, and GI bleed who was admitted on 09/13/2021 for acute on chronic CHF after presenting with chest pain and shortness of breath.  Assessment & Plan    Acute on Chronic Combined CHF Non-Ischemic Cardiomyopathy S/p ICD Patient presented with worsening orthopnea and PND as well as a non-productive cough. BNP 1,233. Chest CT showed pulmonary edema and a small right pleural effusion. Echo this admission showed LVEF of 20-25% with global hypokinesis and grade III diastolic dysfunction, normal RV size with mildly reduced RV systolic function and moderately elevated RVSP of 45.5 mmHg, moderate to severe MR (likely functional), and mild to moderate TR. Started on IV Lasix. Documented urinary output of 2.7 L yesterday. Renal function stable. - Still having short of breath but improved. Does not appear significantly volume overloaded on exam. - ReDs clip 40% today. -  Continue IV Lasix '40mg'$  twice daily daily. - Continue Entresto 24-'26mg'$  twice daily. - Will continue to hold home Toprol-XL right now due to soft BP. - No MRA or SGLT2 inhibitor for now due to soft BP to allow for additional diuresis. May be able to add after IV Lasix has be stopped. - Continue to monitor daily weights, strict I/Os, and renal function.  Non-Obstructive CAD Noted on cardiac catheterization in 2018. - No chest pain. - Not on aspirin due to anaphylaxis. - Continue statin.  Persistent Atrial Fibrillation V paced on telemetry. - He is on Xarelto '20mg'$  daily at home (this was previously stopped due to gastric cancer but he has been back on this). Only on Lovenox here. Will restart home Xarelto. He denies any abnormal bleeding and hemoglobin stable.  Otherwise, per primary team.  For questions or updates, please contact [facility] Please consult [email or web address] for contact info under        Signed, Darreld Mclean, PA-C  09/14/2021, 12:29 PM    Patient seen and examined with Sande Rives, PA-C.  Agree as above, with the following exceptions and changes as noted below.  Feeling three fourths of the way back to normal, was able to ambulate today without symptoms and is in excellent spirits.  Wife at the bedside.  Reds clip reading as noted above, mildly elevated. Gen: NAD, CV: RRR, no murmurs, Lungs: clear, Abd: soft, Extrem: Warm, well perfused, no edema, Neuro/Psych: alert and oriented x 3, normal mood and affect. All available labs, radiology testing, previous records reviewed. Agree with one additional day of diuresis and likely transition to oral tomorrow. Discussed in detail with patient and wife at the bedside.   Elouise Munroe, MD 09/14/21 3:10 PM

## 2021-09-14 NOTE — Progress Notes (Signed)
Heart Failure Stewardship Pharmacist Progress Note   PCP: [name], MD PCP-Cardiologist: Mertie Moores, MD    HPI:  71 yo M with PMH of HFrEF s/p ICD, CAD, afib, gastric cancer, and GIB.   Sees Dr. Lovena Le as outpatient. ECHO 02/2016 with LVEF 15-20%. Had cath 2 weeks later that showed mild nonobstructive CAD. Repeat ECHO in March 2018 with EF stable 15%. Received BIV ICD with CRT-D on 05/25/16. Lead revision/repair in 2020.   Admitted 11/2020 with generalized weakness and fatigue and was found to have possible malignant gastric tumor. ECHO 12/07/20 with LVEF 20-25%. Discharged and followed up with oncology as outpatient.  Admitted 03/2021 with hypotension due to hypovolemia. ECHO 04/02/21 with LVEF 20-25%.  He presented to the ED on 8/2 with shortness of breath and chest discomfort. CXR without edema. CTA negative for PE but with mild pulmonary edema. ECHO 8/2 with LVEF 20-25%.  ReDS 40% today.  Current HF Medications: Diuretic: furosemide 40 mg IV BID Beta Blocker: metoprolol XL 25 mg daily ACE/ARB/ARNI: Entresto 24/26 mg BID  Prior to admission HF Medications: Diuretic: furosemide 20 mg daily Beta blocker: metoprolol XL 25 mg daily ACE/ARB/ARNI: Entresto 24/26 mg BID  Pertinent Lab Values: Serum creatinine 1.14, BUN 15, Potassium 3.7, Sodium 138, BNP 1233.3, Magnesium 1.9  Ferritin 25 (7/31)  Vital Signs: Weight: 184 lbs (admission weight: 197 lbs) Blood pressure: 100/70s  Heart rate: 60s  I/O: -1.5L yesterday; net -2.9L  Medication Assistance / Insurance Benefits Check: Does the patient have prescription insurance?  Yes Type of insurance plan: [employer] Medicare  Does the patient qualify for medication assistance through manufacturers or grants?   Pending Eligible grants and/or patient assistance programs: pending Medication assistance applications in progress: none  Medication assistance applications approved: none Approved medication assistance renewals will be  completed by: pending  Outpatient Pharmacy:  Prior to admission outpatient pharmacy: [facility] Is the patient willing to use [facility] pharmacy at discharge? Yes Is the patient willing to transition their outpatient pharmacy to utilize a [facility] outpatient pharmacy?   Pending    Assessment: 1. Acute on chronic systolic CHF (LVEF 56-81%), due to NICM. NYHA class III symptoms. - Continue furosemide 40 mg IV BID. Strict I/O. Daily weights. Goal K>4 and Mag>2. Needs KCl supplementation - recommend 60 mEq x 1 - Continue metoprolol XL 25 mg daily. Also on sotalol for afib.  - Continue Entresto 24/26 mg BID - Consider starting spironolactone and SGLT2i prior to discharge (Jardiance preferred on insurance). Has taken Jardiance in the past. Stopped 11/2020 - had hypovolemia and low CBGs at this time with new gastric mass. - Hold any IV iron infusions if cardiac MRI planned   Plan: 1) Medication changes recommended at this time: - KCl 60 mEq x 1  2) Patient assistance: [name] copay $45 - Jardiance copay $45 - Farxiga copay $95 - Can help with patient assistance applications if copays are unaffordable  3)  Education  - To be completed prior to discharge  Kerby Nora, PharmD, BCPS Heart Failure Stewardship Pharmacist Phone [phone]

## 2021-09-14 NOTE — Discharge Instructions (Signed)

## 2021-09-14 NOTE — Progress Notes (Signed)
Physical Therapy Treatment Patient Details Name: Jon Williams MRN: [id number] DOB: [birth year] Today's Date: 09/14/2021   History of Present Illness 71 yo male with onset of SOB was admitted on 8/2 for managment of his symptoms.  Was noted to have CHF, cleared for PE but has mild pulm edema.  Had a recent bout of chemo for his gastric tumor, then home therapy for generalized weakness.  PMHx:  ICD, CAD, a-fib, gastric CA, GI bleed, CHF, hypotension, LVEF 20-25%,    PT Comments    Pt received supine and agreeable to session with good progress towards acute goals. Pt able to demonstrate ambulation with RW and min guard for safety, pt with x2 LOB to right with pt able to self correct in all instances. Pt able to ascend/descend 4 steps in stairwell with no LOB utilizing 2 hands on one rail and step to pattern for safety. Education reiterated re; daily weights, nutrition, pacing, energy conservation and activity recommendations with pt verbalizing understanding and stating compliance. Pt continues to benefit from skilled PT services to progress toward functional mobility goals.    Recommendations for follow up therapy are one component of a multi-disciplinary discharge planning process, led by the attending physician.  Recommendations may be updated based on patient status, additional functional criteria and insurance authorization.  Follow Up Recommendations  No PT follow up     Assistance Recommended at Discharge Set up Supervision/Assistance  Patient can return home with the following A little help with walking and/or transfers;Assist for transportation;Help with stairs or ramp for entrance   Equipment Recommendations  None recommended by PT    Recommendations for Other Services Other (comment) (referred to mobility specialists)     Precautions / Restrictions Precautions Precautions: Fall Precaution Comments: monitor O2 sats and HR Restrictions Weight Bearing Restrictions: No      Mobility  Bed Mobility Overal bed mobility: Modified Independent                  Transfers Overall transfer level: Needs assistance Equipment used: Rolling walker (2 wheels) Transfers: Sit to/from Stand Sit to Stand: Supervision           General transfer comment: supervision for safety, STS x5 in 22 seconds    Ambulation/Gait Ambulation/Gait assistance: Min guard Gait Distance (Feet): 520 Feet Assistive device: Rolling walker (2 wheels), 1 person hand held assist Gait Pattern/deviations: Step-through pattern, Decreased stride length, Wide base of support, Shuffle Gait velocity: reduced     General Gait Details: min guard for safety, slight LOB toward Williams x2, pt able to self correct in all instances.   Stairs Stairs: Yes Stairs assistance: Min guard Stair Management: One rail Left, Step to pattern, Forwards Number of Stairs: 4 General stair comments: up/down stairs in stairwell without fault, pt utilizing 2 hands on rail and step to pattern, no LOB   Wheelchair Mobility    Modified Rankin (Stroke Patients Only)       Balance Overall balance assessment: Needs assistance Sitting-balance support: Feet supported Sitting balance-Leahy Scale: Good     Standing balance support: Bilateral upper extremity supported, During functional activity Standing balance-Leahy Scale: Fair Standing balance comment: less than fair dynamically                            Cognition Arousal/Alertness: Awake/alert Behavior During Therapy: WFL for tasks assessed/performed Overall Cognitive Status: Within Functional Limits for tasks assessed  Exercises Other Exercises Other Exercises: STS x5 in 22seconds, use of UE on bed at side    General Comments General comments (skin integrity, edema, etc.): educated pt re' activity reccomendations, daily weights, nutiriton, and activity recomendations with pt  verbalizing understanding and stating compliance      Pertinent Vitals/Pain      Home Living                          Prior Function            PT Goals (current goals can now be found in the care plan section) Acute Rehab PT Goals Patient Stated Goal: to get home to his own walker PT Goal Formulation: With patient/family Time For Goal Achievement: 09/27/21    Frequency    Min 3X/week      PT Plan      Co-evaluation              AM-PAC PT "6 Clicks" Mobility   Outcome Measure  Help needed turning from your back to your side while in a flat bed without using bedrails?: None Help needed moving from lying on your back to sitting on the side of a flat bed without using bedrails?: A Little Help needed moving to and from a bed to a chair (including a wheelchair)?: A Little Help needed standing up from a chair using your arms (e.g., wheelchair or bedside chair)?: A Little Help needed to walk in hospital room?: A Little Help needed climbing 3-5 steps with a railing? : A Little 6 Click Score: 19    End of Session   Activity Tolerance: Patient tolerated treatment well Patient left: in bed;with family/visitor present (seated EOB) Nurse Communication: Mobility status PT Visit Diagnosis: Difficulty in walking, not elsewhere classified (R26.2)     Time: 9179-1505 PT Time Calculation (min) (ACUTE ONLY): 25 min  Charges:  $Gait Training: 8-22 mins $Therapeutic Exercise: 8-22 mins                     Jon Williams. PTA Acute Rehabilitation Services Office: [phone]    Jon Williams 09/14/2021, 12:22 PM

## 2021-09-15 ENCOUNTER — Other Ambulatory Visit (HOSPITAL_COMMUNITY): Payer: Self-pay

## 2021-09-15 ENCOUNTER — Encounter: Payer: Self-pay | Admitting: Hematology

## 2021-09-15 DIAGNOSIS — I5043 Acute on chronic combined systolic (congestive) and diastolic (congestive) heart failure: Secondary | ICD-10-CM | POA: Diagnosis not present

## 2021-09-15 DIAGNOSIS — I2510 Atherosclerotic heart disease of native coronary artery without angina pectoris: Secondary | ICD-10-CM | POA: Diagnosis not present

## 2021-09-15 DIAGNOSIS — E785 Hyperlipidemia, unspecified: Secondary | ICD-10-CM | POA: Diagnosis not present

## 2021-09-15 DIAGNOSIS — Z95828 Presence of other vascular implants and grafts: Secondary | ICD-10-CM

## 2021-09-15 DIAGNOSIS — C61 Malignant neoplasm of prostate: Secondary | ICD-10-CM | POA: Diagnosis not present

## 2021-09-15 DIAGNOSIS — E440 Moderate protein-calorie malnutrition: Secondary | ICD-10-CM

## 2021-09-15 DIAGNOSIS — Z95810 Presence of automatic (implantable) cardiac defibrillator: Secondary | ICD-10-CM | POA: Diagnosis not present

## 2021-09-15 DIAGNOSIS — Z66 Do not resuscitate: Secondary | ICD-10-CM | POA: Diagnosis not present

## 2021-09-15 DIAGNOSIS — R079 Chest pain, unspecified: Secondary | ICD-10-CM | POA: Diagnosis not present

## 2021-09-15 DIAGNOSIS — I4819 Other persistent atrial fibrillation: Secondary | ICD-10-CM | POA: Diagnosis not present

## 2021-09-15 LAB — BASIC METABOLIC PANEL
Anion gap: 10 (ref 5–15)
BUN: 21 mg/dL (ref 8–23)
CO2: 24 mmol/L (ref 22–32)
Calcium: 9 mg/dL (ref 8.9–10.3)
Chloride: 104 mmol/L (ref 98–111)
Creatinine, Ser: 1.15 mg/dL (ref 0.61–1.24)
GFR, Estimated: >60 mL/min (ref >60)
Glucose, Bld: 102 mg/dL — ABNORMAL HIGH (ref 70–99)
Potassium: 4 mmol/L (ref 3.5–5.1)
Sodium: 138 mmol/L (ref 135–145)

## 2021-09-15 LAB — GLUCOSE, CAPILLARY
Glucose-Capillary: 94 mg/dL (ref 70–99)
Glucose-Capillary: 162 mg/dL — ABNORMAL HIGH (ref 70–99)

## 2021-09-15 MED ORDER — EMPAGLIFLOZIN 10 MG PO TABS
10.0000 mg | ORAL_TABLET | Freq: Every day | ORAL | Status: DC
  Administered 2021-09-15: 10 mg via ORAL
  Filled 2021-09-15: qty 1

## 2021-09-15 MED ORDER — RIVAROXABAN 20 MG PO TABS
20.0000 mg | ORAL_TABLET | Freq: Every day | ORAL | 0 refills | Status: DC
  Filled 2021-09-15: qty 30, 30d supply, fill #0

## 2021-09-15 MED ORDER — EMPAGLIFLOZIN 10 MG PO TABS
10.0000 mg | ORAL_TABLET | Freq: Every day | ORAL | 0 refills | Status: DC
  Filled 2021-09-15: qty 30, 30d supply, fill #0

## 2021-09-15 MED ORDER — SODIUM CHLORIDE 0.9 % IV SOLN
510.0000 mg | INTRAVENOUS | Status: AC
  Administered 2021-09-15: 510 mg via INTRAVENOUS
  Filled 2021-09-15: qty 510

## 2021-09-15 NOTE — Progress Notes (Signed)
Physical Therapy Treatment Patient Details Name: Jon Williams MRN: [id number] DOB: August 07, 1950 Today's Date: 09/15/2021   History of Present Illness 71 yo male with onset of SOB was admitted on 8/2 for managment of his symptoms.  Was noted to have CHF, cleared for PE but has mild pulm edema.  Had a recent bout of chemo for his gastric tumor, then home therapy for generalized weakness.  PMHx:  ICD, CAD, a-fib, gastric CA, GI bleed, CHF, hypotension, LVEF 20-25%,    PT Comments    Pt received OOB in recliner and motivated for session with focus on balance challenges and LE strength. Pt with noted increase in gait stability and mechanics without AD during high intensity gait trials, close contact guard provided throughout. Pt with good tolerance for stepping balance exercises side<>side, forward<>retro with no overt LOB and pt demonstrating good ankle strategies to correct any mild LOB. Pt able to maintain static standing with feet together and eyes closed for >60 seconds with no LOB. Pt with fair tolerance for lateral step ups for LE strengthening for overall balance improvement. Encouraged pt to continue mobility and strength exercises post acutely with pt verbalizing understanding.  Pt continues to benefit from skilled PT services to progress toward functional mobility goals.    Recommendations for follow up therapy are one component of a multi-disciplinary discharge planning process, led by the attending physician.  Recommendations may be updated based on patient status, additional functional criteria and insurance authorization.  Follow Up Recommendations  No PT follow up     Assistance Recommended at Discharge Set up Supervision/Assistance  Patient can return home with the following A little help with walking and/or transfers;Assist for transportation;Help with stairs or ramp for entrance   Equipment Recommendations  None recommended by PT    Recommendations for Other Services        Precautions / Restrictions Precautions Precautions: Fall Precaution Comments: monitor O2 sats and HR Restrictions Weight Bearing Restrictions: No     Mobility  Bed Mobility Overal bed mobility: Modified Independent             General bed mobility comments: received in recliner    Transfers Overall transfer level: Modified independent Equipment used: None               General transfer comment: slightly increased time    Ambulation/Gait Ambulation/Gait assistance: Min guard, Min assist Gait Distance (Feet): 150 Feet Assistive device: 1 person hand held assist Gait Pattern/deviations: Step-through pattern, Decreased stride length, Wide base of support, Shuffle       General Gait Details: min guard for safety, able to ambulate without AD with this PTA providing close contact guard at posterior gait belt, 2x bouts of max gait speed (15' + 48') with noted increase in stability and improved gait mechanics   Stairs             Wheelchair Mobility    Modified Rankin (Stroke Patients Only)       Balance Overall balance assessment: Needs assistance Sitting-balance support: Feet supported Sitting balance-Leahy Scale: Good Sitting balance - Comments: seated in recliner                                    Cognition Arousal/Alertness: Awake/alert Behavior During Therapy: WFL for tasks assessed/performed Overall Cognitive Status: Within Functional Limits for tasks assessed  Exercises Other Exercises Other Exercises: side stepping, forawrd/retro stepping without AD Other Exercises: static stnaidng balance with eyes closed Other Exercises: lateral step ups 8" step x10 each side with B hands on elevated bed rail, encouraged forward step ups at home post d/c Other Exercises: high intensity gait training 15' + 40', no LOB and noted improvement in gait mechanics    General Comments         Pertinent Vitals/Pain Pain Assessment Pain Assessment: No/denies pain    Home Living                          Prior Function            PT Goals (current goals can now be found in the care plan section) Acute Rehab PT Goals Patient Stated Goal: to go home PT Goal Formulation: With patient/family Time For Goal Achievement: 09/27/21    Frequency    Min 3X/week      PT Plan      Co-evaluation              AM-PAC PT "6 Clicks" Mobility   Outcome Measure  Help needed turning from your back to your side while in a flat bed without using bedrails?: None Help needed moving from lying on your back to sitting on the side of a flat bed without using bedrails?: A Little Help needed moving to and from a bed to a chair (including a wheelchair)?: A Little Help needed standing up from a chair using your arms (e.g., wheelchair or bedside chair)?: A Little Help needed to walk in hospital room?: A Little Help needed climbing 3-5 steps with a railing? : A Little 6 Click Score: 19    End of Session Equipment Utilized During Treatment: Gait belt Activity Tolerance: Patient tolerated treatment well Patient left: in chair;with call bell/phone within reach;with family/visitor present;with nursing/sitter in room Nurse Communication: Mobility status PT Visit Diagnosis: Difficulty in walking, not elsewhere classified (R26.2)     Time: 5277-8242 PT Time Calculation (min) (ACUTE ONLY): 26 min  Charges:  $Therapeutic Exercise: 8-22 mins $Neuromuscular Re-education: 8-22 mins                     Rithik Odea R. PTA Acute Rehabilitation Services Office: [phone]    [name] 09/15/2021, 1:25 PM

## 2021-09-15 NOTE — Progress Notes (Signed)
Order received to discharge patient.  Telemetry monitor removed and CCMD notified.  PIV access removed.  DC prescriptions delivered to room via [place].  Discharge instructions, follow up, medications and instructions for their use discussed with patient and wife at bedside.

## 2021-09-15 NOTE — Progress Notes (Signed)
Occupational Therapy Treatment Patient Details Name: Jon Williams MRN: [id number] DOB: [birth year] Today's Date: 09/15/2021   History of present illness 71 yo male with onset of SOB was admitted on 8/2 for managment of his symptoms.  Was noted to have CHF, cleared for PE but has mild pulm edema.  Had a recent bout of chemo for his gastric tumor, then home therapy for generalized weakness.  PMHx:  ICD, CAD, a-fib, gastric CA, GI bleed, CHF, hypotension, LVEF 20-25%,   OT comments  Patient received in recliner and had completed breakfast. Patient stated he had no difficulty feeding self with built up utensils and was able to manage cup without handles. Patient instructed in [facility] and dexterity exercises with yellow therapy putty and hand exercise ball. Patient demonstrated good understanding of exercises and stated his hands felt better afterwards. Acute OT to continue to follow.    Recommendations for follow up therapy are one component of a multi-disciplinary discharge planning process, led by the attending physician.  Recommendations may be updated based on patient status, additional functional criteria and insurance authorization.    Follow Up Recommendations  Outpatient OT    Assistance Recommended at Discharge Intermittent Supervision/Assistance  Patient can return home with the following  A little help with walking and/or transfers;A little help with bathing/dressing/bathroom;Assistance with cooking/housework   Equipment Recommendations  None recommended by OT    Recommendations for Other Services      Precautions / Restrictions Precautions Precautions: Fall Precaution Comments: monitor O2 sats and HR Restrictions Weight Bearing Restrictions: No       Mobility Bed Mobility Overal bed mobility: Modified Independent             General bed mobility comments: received in recliner    Transfers                   General transfer comment: not performed this  session     Balance Overall balance assessment: Needs assistance Sitting-balance support: Feet supported Sitting balance-Leahy Scale: Good Sitting balance - Comments: seated in recliner                                   ADL either performed or assessed with clinical judgement   ADL Overall ADL's : Needs assistance/impaired Eating/Feeding: Sitting;Set up Eating/Feeding Details (indicate cue type and reason): patient had completed breakfast and states he was able to feed self withbuilt up utensils and was able to manage cup Grooming: Wash/dry hands;Wash/dry face;Set up;Sitting                                 General ADL Comments: seen seated in recliner    Extremity/Trunk Assessment Upper Extremity Assessment RUE Deficits / Details: impaired FMC and sensation since chemo RUE Sensation: decreased light touch;decreased proprioception RUE Coordination: decreased fine motor LUE Deficits / Details: impaired FMC and sensation since chemo LUE Sensation: decreased light touch;decreased proprioception LUE Coordination: decreased fine motor            Vision       Perception     Praxis      Cognition Arousal/Alertness: Awake/alert Behavior During Therapy: WFL for tasks assessed/performed Overall Cognitive Status: Within Functional Limits for tasks assessed  Exercises Exercises: Other exercises Other Exercises Other Exercises: BUE hand dexterity exercises with yellow therapy putty and exercise ball    Shoulder Instructions       General Comments      Pertinent Vitals/ Pain       Pain Assessment Pain Assessment: No/denies pain  Home Living                                          Prior Functioning/Environment              Frequency  Min 2X/week        Progress Toward Goals  OT Goals(current goals can now be found in the care plan section)  Progress  towards OT goals: Progressing toward goals  Acute Rehab OT Goals Patient Stated Goal: go home OT Goal Formulation: With patient Time For Goal Achievement: 09/28/21 Potential to Achieve Goals: Good ADL Goals Pt Will Perform Eating: with modified independence;sitting;with adaptive utensils Pt Will Perform Grooming: with modified independence;sitting;standing Pt Will Perform Lower Body Dressing: with modified independence;with adaptive equipment;sit to/from stand Pt Will Transfer to Toilet: with modified independence;ambulating Pt/caregiver will Perform Home Exercise Program: Increased strength;Both right and left upper extremity;With theraputty  Plan Discharge plan remains appropriate    Co-evaluation                 AM-PAC OT "6 Clicks" Daily Activity     Outcome Measure   Help from another person eating meals?: A Little Help from another person taking care of personal grooming?: A Little Help from another person toileting, which includes using toliet, bedpan, or urinal?: A Little Help from another person bathing (including washing, rinsing, drying)?: A Little Help from another person to put on and taking off regular upper body clothing?: A Little Help from another person to put on and taking off regular lower body clothing?: A Lot 6 Click Score: 17    End of Session    OT Visit Diagnosis: Other abnormalities of gait and mobility (R26.89);Muscle weakness (generalized) (M62.81);Other symptoms and signs involving the nervous system (R29.898)   Activity Tolerance Patient tolerated treatment well   Patient Left in chair;with call bell/phone within reach;with family/visitor present   Nurse Communication Mobility status        Time: 3300-7622 OT Time Calculation (min): 21 min  Charges: OT General Charges $OT Visit: 1 Visit OT Treatments $Therapeutic Exercise: 8-22 mins  Lodema Hong, [place]  Office [phone]   Trixie Dredge 09/15/2021,  12:37 PM

## 2021-09-15 NOTE — Progress Notes (Signed)
Notified on call triad MD of 15 beats of non sustained vtac. Vitals stable, patient in bed without any discomfort. Currently v-paced.

## 2021-09-15 NOTE — TOC Transition Note (Signed)
Transition of Care (TOC) - CM/SW Discharge Note Marvetta Gibbons RN, BSN Transitions of Care Unit 4E- RN Case Manager See Treatment Team for direct phone #    Patient Details  Name: Jon Williams MRN: [id number] Date of Birth: 05-03-1950  Transition of Care [facility]) CM/SW Contact:  Dawayne Patricia, RN Phone Number: 09/15/2021, 2:29 PM   Clinical Narrative:    Pt stable for transition home today, noted recommendations for outpt OT- CM in to speak with pt at bedside regarding outpt therapy needs.  Per conversation with pt he states he definitely wants outpt therapy for his hands to increase use and dexterity. Discussed locations and pt agreeable to referral for outpt OT at [facility] st location.   Msg sent to MD and verbal order received for outpt OT referral.   Referral has been sent to [facility] st location for outpt OT.   No further TOC needs noted. Family to transport home.    Final next level of care: OP Rehab Barriers to Discharge: No Barriers Identified   Patient Goals and CMS Choice Patient states their goals for this hospitalization and ongoing recovery are:: return home      Discharge Placement               Home        Discharge Plan and Services   Discharge Planning Services: CM Consult Post Acute Care Choice: NA          DME Arranged: N/A DME Agency: NA       HH Arranged: NA HH Agency: NA        Social Determinants of Health (SDOH) Interventions     Readmission Risk Interventions    09/15/2021    2:29 PM 04/04/2021   12:46 PM  Readmission Risk Prevention Plan  Transportation Screening Complete Complete  PCP or Specialist Appt within 3-5 Days Complete   HRI or [place] Complete   Social Work Consult for [place] Planning/Counseling Complete   Palliative Care Screening Not Applicable   Medication Review Press photographer) Complete Complete  PCP or Specialist appointment within 3-5 days of discharge  Complete  HRI or  [place]  Complete  SW Recovery Care/Counseling Consult  Complete  [place]  Not Applicable

## 2021-09-15 NOTE — Progress Notes (Addendum)
Heart Failure Stewardship Pharmacist Progress Note   PCP: [name], MD PCP-Cardiologist: Mertie Moores, MD    HPI:  71 yo M with PMH of HFrEF s/p ICD, CAD, afib, gastric cancer, and GIB.   Sees Dr. Lovena Le as outpatient. ECHO 02/2016 with LVEF 15-20%. Had cath 2 weeks later that showed mild nonobstructive CAD. Repeat ECHO in March 2018 with EF stable 15%. Received BIV ICD with CRT-D on 05/25/16. Lead revision/repair in 2020.   Admitted 11/2020 with generalized weakness and fatigue and was found to have possible malignant gastric tumor. ECHO 12/07/20 with LVEF 20-25%. Discharged and followed up with oncology as outpatient.  Admitted 03/2021 with hypotension due to hypovolemia. ECHO 04/02/21 with LVEF 20-25%.  He presented to the ED on 8/2 with shortness of breath and chest discomfort. CXR without edema. CTA negative for PE but with mild pulmonary edema. ECHO 8/2 with LVEF 20-25%.  Current HF Medications: Beta Blocker: metoprolol XL 25 mg daily ACE/ARB/ARNI: Entresto 24/26 mg BID SGLT2i: Jardiance 10 mg daily  Prior to admission HF Medications: Diuretic: furosemide 20 mg daily Beta blocker: metoprolol XL 25 mg daily ACE/ARB/ARNI: Entresto 24/26 mg BID  Pertinent Lab Values: Serum creatinine 1.15, BUN 21, Potassium 4.0, Sodium 138, BNP 1233.3, Magnesium 1.9  Ferritin 25 (7/31)  Vital Signs: Weight: 186 lbs (admission weight: 197 lbs) Blood pressure: 100/70s  Heart rate: 60s  I/O: -2.2L yesterday; net -4.3L  Medication Assistance / Insurance Benefits Check: Does the patient have prescription insurance?  Yes Type of insurance plan: [employer] Medicare  Does the patient qualify for medication assistance through manufacturers or grants?   Yes Eligible grants and/or patient assistance programs:  Jardiance Medication assistance applications in progress:  Jardiance  Medication assistance applications approved: [name], Xarelto Approved medication assistance renewals will be  completed by: pending  Outpatient Pharmacy:  Prior to admission outpatient pharmacy: [facility] Is the patient willing to use [facility] at discharge? Yes Is the patient willing to transition their outpatient pharmacy to utilize a [facility] outpatient pharmacy?   Pending    Assessment: 1. Acute on chronic systolic CHF (LVEF 35-32%), due to NICM. NYHA class III symptoms. - No JVD or edema on exam. Off IV lasix. Strict I/O. Daily weights. Goal K>4 and Mag>2. - Continue metoprolol XL 25 mg daily. Also on sotalol for afib.  - Continue Entresto 24/26 mg BID - Consider starting spironolactone at follow up - Agree with starting Jardiance 10 mg daily. Has been on Jardiance in the past. Stopped 11/2020 - had hypovolemia and low CBGs at this time with new gastric mass. - Will message cardiology about administering one dose of IV iron since MRI images may be non-diagnostic    Plan: 1) Medication changes recommended at this time: - Agree with changes - IV Feraheme 510 mg x 1 if no cardiac MRI planned in the short-term  2) Patient assistance: - Entresto copay $45 - Jardiance copay $45 - Farxiga copay $95 - Already receives patient assistance for Entresto and Xarelto - Assistance initiated for Jardiance  3)  Education  - Patient has been educated on current HF medications and potential additions to HF medication regimen - Patient verbalizes understanding that over the next few months, these medication doses may change and more medications may be added to optimize HF regimen - Patient has been educated on basic disease state pathophysiology and goals of therapy   Kerby Nora, PharmD, BCPS Heart Failure Cytogeneticist Phone [phone]

## 2021-09-15 NOTE — Care Management Important Message (Signed)
Important Message  Patient Details  Name: Jon Williams MRN: [id number] Date of Birth: Oct 01, 1950   Medicare Important Message Given:  Yes     [name] 09/15/2021, 2:21 PM

## 2021-09-15 NOTE — Plan of Care (Signed)

## 2021-09-15 NOTE — Progress Notes (Signed)
Progress Note  Patient Name: Jon Williams Date of Encounter: 09/15/2021  [facility] Cardiologist: Mertie Moores, MD   Subjective   Feeling well ready to go home.   Inpatient Medications    Scheduled Meds:  atorvastatin  40 mg Oral Daily   docusate sodium  100 mg Oral BID   feeding supplement  237 mL Oral BID BM   gabapentin  100 mg Oral BID   insulin aspart  0-15 Units Subcutaneous TID WC   insulin aspart  0-5 Units Subcutaneous QHS   metoprolol succinate  25 mg Oral Daily   pantoprazole  40 mg Oral BID   rivaroxaban  20 mg Oral Q supper   sacubitril-valsartan  1 tablet Oral BID   sodium chloride flush  3 mL Intravenous Q12H   sotalol  120 mg Oral Q12H   Continuous Infusions:  PRN Meds: acetaminophen **OR** acetaminophen, bisacodyl, hydrALAZINE, nitroGLYCERIN, ondansetron **OR** ondansetron (ZOFRAN) IV, polyethylene glycol, traZODone   Vital Signs    Vitals:   09/14/21 2317 09/15/21 0423 09/15/21 0700 09/15/21 0806  BP: 105/75 93/73  113/76  Pulse: 64 60  62  Resp: '19 15  16  '$ Temp: 98.3 F (36.8 C) 97.8 F (36.6 C)  98 F (36.7 C)  TempSrc: Oral Oral  Oral  SpO2: 98% 96%  100%  Weight:   84.7 kg   Height:        Intake/Output Summary (Last 24 hours) at 09/15/2021 0946 Last data filed at 09/15/2021 5400 Gross per 24 hour  Intake 240 ml  Output 2150 ml  Net -1910 ml      09/15/2021    7:00 AM 09/14/2021    6:42 AM 09/14/2021    5:00 AM  Last 3 Weights  Weight (lbs) 186 lb 11.7 oz 184 lb 15.5 oz 197 lb 5 oz  Weight (kg) 84.7 kg 83.9 kg 89.5 kg      Telemetry    V paced rhythm with occasional PVCs. NSVT- Personally Reviewed  ECG    No new ECG tracing today. - Personally Reviewed  Physical Exam   GEN: No acute distress.   Neck: No JVD Cardiac: RRR, no murmurs, rubs, or gallops.  Respiratory: Clear to auscultation bilaterally. GI: Soft, nontender, non-distended  MS: No edema; No deformity. Neuro:  Nonfocal  Psych: Normal affect     Labs     High Sensitivity Troponin:   Recent Labs  Lab 09/13/21 0246 09/13/21 0454  TROPONINIHS 5 5     Chemistry Recent Labs  Lab 09/11/21 1024 09/13/21 0246 09/13/21 0454 09/14/21 0233 09/15/21 0324  NA 142 139  --  138 138  K 3.8 3.6  --  3.7 4.0  CL 110 110  --  106 104  CO2 27 22  --  25 24  GLUCOSE 114* 120*  --  106* 102*  BUN 21 14  --  15 21  CREATININE 1.04 0.99  --  1.14 1.15  CALCIUM 9.0 9.0  --  8.8* 9.0  MG  --   --  1.9  --   --   PROT 6.8 6.4*  --   --   --   ALBUMIN 4.3 3.9  --   --   --   AST 22 41  --   --   --   ALT 35 55*  --   --   --   ALKPHOS 56 62  --   --   --   BILITOT  1.3* 1.0  --   --   --   GFRNONAA >60 >60  --  >60 >60  ANIONGAP 5 7  --  7 10    Lipids No results for input(s): "CHOL", "TRIG", "HDL", "LABVLDL", "LDLCALC", "CHOLHDL" in the last 168 hours.  Hematology Recent Labs  Lab 09/11/21 1024 09/13/21 0246 09/14/21 0233  WBC 6.5 5.4 4.6  RBC 3.73* 3.74* 3.55*  HGB 11.8* 11.9* 11.0*  HCT 35.0* 36.1* 33.1*  MCV 93.8 96.5 93.2  MCH 31.6 31.8 31.0  MCHC 33.7 33.0 33.2  RDW 15.1 15.2 15.1  PLT 163 164 151   Thyroid No results for input(s): "TSH", "FREET4" in the last 168 hours.  BNP Recent Labs  Lab 09/13/21 0246  BNP 1,233.3*    DDimer No results for input(s): "DDIMER" in the last 168 hours.   Radiology    ECHOCARDIOGRAM COMPLETE  Result Date: 09/13/2021    ECHOCARDIOGRAM REPORT   Patient Name:   Jon Williams Date of Exam: 09/13/2021 Medical Rec #:  [id number]        Height:       66.0 in Accession #:    [id number]       Weight:       197.0 lb Date of Birth:  [birth year]         BSA:          1.987 m Patient Age:    71 years         BP:           120/81 mmHg Patient Gender: M                HR:           64 bpm. Exam Location:  Inpatient Procedure: 2D Echo, Cardiac Doppler and Color Doppler Indications:    CHF  History:        Patient has prior history of Echocardiogram examinations, most                 recent 04/02/2021. CAD;  Risk Factors:Diabetes.  Sonographer:    Jefferey Pica Referring Phys: Karmen Bongo IMPRESSIONS  1. Left ventricular ejection fraction, by estimation, is 20 to 25%. The left ventricle has severely decreased function. The left ventricle demonstrates global hypokinesis. The left ventricular internal cavity size was moderately dilated. Left ventricular diastolic parameters are consistent with Grade III diastolic dysfunction (restrictive).  2. Right ventricular systolic function is mildly reduced. The right ventricular size is normal. There is moderately elevated pulmonary artery systolic pressure. The estimated right ventricular systolic pressure is 26.3 mmHg.  3. Left atrial size was severely dilated.  4. The mitral valve is abnormal. Moderate to severe mitral valve regurgitation, likely functional mitral regurgitation with dilated annulus. No evidence of mitral stenosis.  5. Tricuspid valve regurgitation is mild to moderate.  6. The aortic valve is tricuspid. Aortic valve regurgitation is not visualized. No aortic stenosis is present.  7. The inferior vena cava is dilated in size with >50% respiratory variability, suggesting right atrial pressure of 8 mmHg. FINDINGS  Left Ventricle: Left ventricular ejection fraction, by estimation, is 20 to 25%. The left ventricle has severely decreased function. The left ventricle demonstrates global hypokinesis. The left ventricular internal cavity size was moderately dilated. There is no left ventricular hypertrophy. Left ventricular diastolic parameters are consistent with Grade III diastolic dysfunction (restrictive). Right Ventricle: The right ventricular size is normal. No increase in right ventricular wall thickness. Right ventricular systolic function  is mildly reduced. There is moderately elevated pulmonary artery systolic pressure. The tricuspid regurgitant velocity is 3.06 m/s, and with an assumed right atrial pressure of 8 mmHg, the estimated right ventricular  systolic pressure is 65.7 mmHg. Left Atrium: Left atrial size was severely dilated. Right Atrium: Right atrial size was normal in size. Pericardium: There is no evidence of pericardial effusion. Mitral Valve: The mitral valve is abnormal. Moderate to severe mitral valve regurgitation. No evidence of mitral valve stenosis. Tricuspid Valve: The tricuspid valve is normal in structure. Tricuspid valve regurgitation is mild to moderate. Aortic Valve: The aortic valve is tricuspid. Aortic valve regurgitation is not visualized. No aortic stenosis is present. Aortic valve peak gradient measures 5.0 mmHg. Pulmonic Valve: The pulmonic valve was normal in structure. Pulmonic valve regurgitation is trivial. Aorta: The aortic root is normal in size and structure. Venous: The inferior vena cava is dilated in size with greater than 50% respiratory variability, suggesting right atrial pressure of 8 mmHg. IAS/Shunts: No atrial level shunt detected by color flow Doppler. Additional Comments: A device lead is visualized in the right ventricle.  LEFT VENTRICLE PLAX 2D LVIDd:         6.85 cm LVIDs:         4.37 cm LV PW:         1.00 cm LV IVS:        1.15 cm LVOT diam:     2.00 cm LV SV:         34 LV SV Index:   17 LVOT Area:     3.14 cm  LV Volumes (MOD) LV vol d, MOD A4C: 326.0 ml LV vol s, MOD A4C: 245.0 ml LV SV MOD A4C:     326.0 ml RIGHT VENTRICLE            IVC RV Basal diam:  3.80 cm    IVC diam: 2.30 cm RV Mid diam:    2.90 cm RV S prime:     9.17 cm/s TAPSE (M-mode): 2.3 cm LEFT ATRIUM              Index         RIGHT ATRIUM           Index LA diam:        5.40 cm  2.72 cm/m    RA Area:     30.60 cm LA Vol (A2C):   138.0 ml 69.45 ml/m   RA Volume:   118.00 ml 59.39 ml/m LA Vol (A4C):   201.0 ml 101.16 ml/m LA Biplane Vol: 176.0 ml 88.58 ml/m  AORTIC VALVE                 PULMONIC VALVE AV Area (Vmax): 2.29 cm     PV Vmax:       0.53 m/s AV Vmax:        111.50 cm/s  PV Peak grad:  1.1 mmHg AV Peak Grad:   5.0 mmHg  LVOT Vmax:      81.40 cm/s LVOT Vmean:     46.900 cm/s LVOT VTI:       0.107 m  AORTA Ao Root diam: 4.30 cm Ao Asc diam:  3.30 cm MITRAL VALVE               TRICUSPID VALVE MV Area (PHT): 6.02 cm    TR Peak grad:   37.5 mmHg MV Decel Time: 126 msec    TR Vmax:  306.00 cm/s MR Peak grad: 56.1 mmHg MR Vmax:      374.50 cm/s  SHUNTS MV E velocity: 92.50 cm/s  Systemic VTI:  0.11 m MV A velocity: 30.70 cm/s  Systemic Diam: 2.00 cm MV E/A ratio:  3.01 Dalton McleanMD Electronically signed by Franki Monte Signature Date/Time: 09/13/2021/4:39:29 PM    Final     Cardiac Studies   Echocardiogram 09/13/2021: Impressions: 1. Left ventricular ejection fraction, by estimation, is 20 to 25%. The  left ventricle has severely decreased function. The left ventricle  demonstrates global hypokinesis. The left ventricular internal cavity size  was moderately dilated. Left  ventricular diastolic parameters are consistent with Grade III diastolic  dysfunction (restrictive).   2. Right ventricular systolic function is mildly reduced. The right  ventricular size is normal. There is moderately elevated pulmonary artery  systolic pressure. The estimated right ventricular systolic pressure is  37.1 mmHg.   3. Left atrial size was severely dilated.   4. The mitral valve is abnormal. Moderate to severe mitral valve  regurgitation, likely functional mitral regurgitation with dilated  annulus. No evidence of mitral stenosis.   5. Tricuspid valve regurgitation is mild to moderate.   6. The aortic valve is tricuspid. Aortic valve regurgitation is not  visualized. No aortic stenosis is present.   7. The inferior vena cava is dilated in size with >50% respiratory  variability, suggesting right atrial pressure of 8 mmHg.   Patient Profile     71 y.o. male with a history of non-obstructive CAD on cardiac catheterization in 2018, non-ischemic cardiomyopathy/ chronic combined CHF with EF of 20-25% on Echo in 03/2021 s/p  ICD in 05/2016, persistent atrial fibrillation/flutter on Xarelto, LBBB,  gastric cancer, and GI bleed who was admitted on 09/13/2021 for acute on chronic CHF after presenting with chest pain and shortness of breath.  Assessment & Plan    Acute on Chronic Combined CHF Non-Ischemic Cardiomyopathy, no definite etiology. S/p ICD Patient presented with worsening orthopnea and PND as well as a non-productive cough. BNP 1,233. Chest CT showed pulmonary edema and a small right pleural effusion. Echo this admission showed LVEF of 20-25% with global hypokinesis and grade III diastolic dysfunction, normal RV size with mildly reduced RV systolic function and moderately elevated RVSP of 45.5 mmHg, moderate to severe MR (likely functional), and mild to moderate TR. Started on IV Lasix.  Renal function stable. - back to baseline - ReDs clip 40% yesterday. -  transition to oral lasix homegoing. Lasix 20 mg po AM and PRN PM. Wife and pt in agreement.  - Continue Entresto 24-'26mg'$  twice daily. - continue toprol XL 25 mg daily - Will add Jardience today per HF Pharmacist recommendations. - can consider adding spironolactone at close follow up.   - consider outpatient MRI, however with ICD, image quality may be nondiagnostic with current scanner capabilities.  Non-Obstructive CAD Noted on cardiac catheterization in 2018. - No chest pain. - Not on aspirin due to anaphylaxis. - Continue statin.  Persistent Atrial Fibrillation V paced on telemetry. - restart home Xarelto. He denies any abnormal bleeding and hemoglobin stable. - also on sotalol. Defer this decision to Dr. Lovena Le.   Otherwise, per primary team.  For questions or updates, please contact [facility] Please consult [email or web address] for contact info under        Signed, Elouise Munroe, MD  09/15/2021, 9:46 AM

## 2021-09-18 NOTE — Discharge Summary (Signed)
Physician Discharge Summary  Jon Williams ZHY:[id number] DOB: 1950/02/18 DOA: 09/13/2021  PCP: Jon Carol, MD  Admit date: 09/13/2021 Discharge date: 09/18/2021 Discharging to: home    Consults:  cardiology Procedures:  none   Discharge Diagnoses:   Principal Problem:   Acute on chronic combined systolic and diastolic CHF (congestive heart failure) ([place]) Active Problems:   Coronary artery disease involving native coronary artery without angina pectoris   Persistent atrial fibrillation ([place])   ICD (implantable cardioverter-defibrillator) in place   Malignant neoplasm of prostate ([place])   Gastric cancer ([place])   Port-A-Cath in place   Essential hypertension   Dyslipidemia   DNR (do not resuscitate)   DM (diabetes mellitus), type 2 ([place])   Malnutrition of moderate degree   Brief hospital course: This is a 71 year old male with combined chronic heart failure with an AICD, coronary artery disease, diabetes mellitus, atrial fibrillation, prostate cancer who presented to the hospital for chest tightness and cough.  He took a nitroglycerin and it helped him feel better.  He also complained of orthopnea for the past 2 to 3 days. In the ED he was treated with IV Lasix for acute congestive heart failure and cardiology was consulted.   Subjective:  He has not had further chest pain. Had mild dyspnea on exertion.  Assessment and Plan: Principal Problem:   Acute on chronic combined systolic and diastolic CHF  Persistent atrial fibrillation (HCC)   ICD (implantable cardioverter-defibrillator) in place   Coronary artery disease involving native coronary artery without angina pectoris -Troponin > 5 and again 5 -Echo shows an EF of 20 to 29%, grade 3 diastolic dysfunction and reduced RV function - REDS reading is 40% today (elevated) -Continue management per cardiology would like to continue with diuresis with IV Lasix today - Continue Entresto, metoprolol, Betapace - Continue Lipitor  and Xarelto   Active Problems:      Malignant neoplasm of prostate (HCC) - Follows with Jon Williams - no longer on treatment     Gastric cancer ([place]) - In remission-on surveillance     DNR (do not resuscitate)    Obesity  Body mass index is 30.14 kg/m.   Moderate Malnutrition  - chronic illness (gastric cancer, CHF) - mild fat depletion, mild muscle depletion - Ensure Enlive (each supplement provides 350kcal and 20 grams of protein), Liberalize Diet, Education    Discharge Instructions  Discharge Instructions     ([place]) Call MD:  Anytime you have any of the following symptoms: 1) 3 pound weight gain in 24 hours or 5 pounds in 1 week 2) shortness of breath, with or without a dry hacking cough 3) swelling in the hands, feet or stomach 4) if you have to sleep on extra pillows at night in order to breathe.   Complete by: As directed    Diet - low sodium heart healthy   Complete by: As directed    Increase activity slowly   Complete by: As directed       Allergies as of 09/15/2021       Reactions   Aspirin Anaphylaxis, Hives   Sulfa Antibiotics Anaphylaxis, Hives, Swelling, Other (See Comments)   Swollen lips        Medication List     TAKE these medications    atorvastatin 40 MG tablet Commonly known as: LIPITOR Take 1 tablet (40 mg total) by mouth daily.   Biotin 1000 MCG tablet Take 1,000 mcg by mouth daily with breakfast.   cholecalciferol  25 MCG (1000 UNIT) tablet Commonly known as: VITAMIN D3 Take 1,000 Units by mouth daily with breakfast.   clotrimazole-betamethasone cream Commonly known as: LOTRISONE Apply 1 Application topically 2 (two) times daily.   Entresto 24-26 MG Generic drug: sacubitril-valsartan Take 1 tablet by mouth 2 (two) times daily.   furosemide 40 MG tablet Commonly known as: LASIX Take 1/2 tablet by mouth daily, may take 1/2 tablet extra daily for fluid What changed:  how much to take how to take this when to  take this additional instructions   gabapentin 100 MG capsule Commonly known as: NEURONTIN TAKE 1 CAPSULE(100 MG) BY MOUTH THREE TIMES DAILY. START AT NIGHT, AND GRADUALLY. INCREASE DOSE AS TOLERATES What changed: See the new instructions.   Jardiance 10 MG Tabs tablet Generic drug: empagliflozin Take 1 tablet (10 mg total) by mouth daily.   metFORMIN 500 MG 24 hr tablet Commonly known as: GLUCOPHAGE-XR Take 500 mg by mouth every evening.   metoprolol succinate 25 MG 24 hr tablet Commonly known as: TOPROL-XL TAKE 1 TABLET(25 MG) BY MOUTH DAILY What changed: See the new instructions.   multivitamin Tabs tablet Take 1 tablet by mouth daily with breakfast.   pantoprazole 40 MG tablet Commonly known as: PROTONIX Take 1 tablet (40 mg total) by mouth 2 (two) times daily.   potassium chloride 10 MEQ tablet Commonly known as: KLOR-CON Twice daily   sotalol 80 MG tablet Commonly known as: BETAPACE Take 1.5 tablets (120 mg total) by mouth in the morning and at bedtime.   Xarelto 20 MG Tabs tablet Generic drug: rivaroxaban Take 1 tablet (20 mg total) by mouth daily with supper.        Follow-up Information     [facility] Follow up.   Specialty: Rehabilitation Why: Referral made for outpt OT- they will contact you for scheduling Contact information: [address] [id number] [place] [phone]                   The results of significant diagnostics from this hospitalization (including imaging, microbiology, ancillary and laboratory) are listed below for reference.    ECHOCARDIOGRAM COMPLETE  Result Date: 09/13/2021    ECHOCARDIOGRAM REPORT   Patient Name:   Jon Williams Date of Exam: 09/13/2021 Medical Rec #:  [id number]        Height:       66.0 in Accession #:    [id number]       Weight:       197.0 lb Date of Birth:  01-Apr-1950         BSA:          1.987 m Patient Age:    19 years          BP:           120/81 mmHg Patient Gender: M                HR:           64 bpm. Exam Location:  Inpatient Procedure: 2D Echo, Cardiac Doppler and Color Doppler Indications:    CHF  History:        Patient has prior history of Echocardiogram examinations, most                 recent 04/02/2021. CAD; Risk Factors:Diabetes.  Sonographer:    Jefferey Pica Referring Phys: Karmen Bongo IMPRESSIONS  1. Left ventricular ejection fraction, by estimation, is 20 to  25%. The left ventricle has severely decreased function. The left ventricle demonstrates global hypokinesis. The left ventricular internal cavity size was moderately dilated. Left ventricular diastolic parameters are consistent with Grade III diastolic dysfunction (restrictive).  2. Right ventricular systolic function is mildly reduced. The right ventricular size is normal. There is moderately elevated pulmonary artery systolic pressure. The estimated right ventricular systolic pressure is 36.1 mmHg.  3. Left atrial size was severely dilated.  4. The mitral valve is abnormal. Moderate to severe mitral valve regurgitation, likely functional mitral regurgitation with dilated annulus. No evidence of mitral stenosis.  5. Tricuspid valve regurgitation is mild to moderate.  6. The aortic valve is tricuspid. Aortic valve regurgitation is not visualized. No aortic stenosis is present.  7. The inferior vena cava is dilated in size with >50% respiratory variability, suggesting right atrial pressure of 8 mmHg. FINDINGS  Left Ventricle: Left ventricular ejection fraction, by estimation, is 20 to 25%. The left ventricle has severely decreased function. The left ventricle demonstrates global hypokinesis. The left ventricular internal cavity size was moderately dilated. There is no left ventricular hypertrophy. Left ventricular diastolic parameters are consistent with Grade III diastolic dysfunction (restrictive). Right Ventricle: The right ventricular size is normal. No  increase in right ventricular wall thickness. Right ventricular systolic function is mildly reduced. There is moderately elevated pulmonary artery systolic pressure. The tricuspid regurgitant velocity is 3.06 m/s, and with an assumed right atrial pressure of 8 mmHg, the estimated right ventricular systolic pressure is 44.3 mmHg. Left Atrium: Left atrial size was severely dilated. Right Atrium: Right atrial size was normal in size. Pericardium: There is no evidence of pericardial effusion. Mitral Valve: The mitral valve is abnormal. Moderate to severe mitral valve regurgitation. No evidence of mitral valve stenosis. Tricuspid Valve: The tricuspid valve is normal in structure. Tricuspid valve regurgitation is mild to moderate. Aortic Valve: The aortic valve is tricuspid. Aortic valve regurgitation is not visualized. No aortic stenosis is present. Aortic valve peak gradient measures 5.0 mmHg. Pulmonic Valve: The pulmonic valve was normal in structure. Pulmonic valve regurgitation is trivial. Aorta: The aortic root is normal in size and structure. Venous: The inferior vena cava is dilated in size with greater than 50% respiratory variability, suggesting right atrial pressure of 8 mmHg. IAS/Shunts: No atrial level shunt detected by color flow Doppler. Additional Comments: A device lead is visualized in the right ventricle.  LEFT VENTRICLE PLAX 2D LVIDd:         6.85 cm LVIDs:         4.37 cm LV PW:         1.00 cm LV IVS:        1.15 cm LVOT diam:     2.00 cm LV SV:         34 LV SV Index:   17 LVOT Area:     3.14 cm  LV Volumes (MOD) LV vol d, MOD A4C: 326.0 ml LV vol s, MOD A4C: 245.0 ml LV SV MOD A4C:     326.0 ml RIGHT VENTRICLE            IVC RV Basal diam:  3.80 cm    IVC diam: 2.30 cm RV Mid diam:    2.90 cm RV S prime:     9.17 cm/s TAPSE (M-mode): 2.3 cm LEFT ATRIUM              Index         RIGHT ATRIUM  Index LA diam:        5.40 cm  2.72 cm/m    RA Area:     30.60 cm LA Vol (A2C):   138.0 ml  69.45 ml/m   RA Volume:   118.00 ml 59.39 ml/m LA Vol (A4C):   201.0 ml 101.16 ml/m LA Biplane Vol: 176.0 ml 88.58 ml/m  AORTIC VALVE                 PULMONIC VALVE AV Area (Vmax): 2.29 cm     PV Vmax:       0.53 m/s AV Vmax:        111.50 cm/s  PV Peak grad:  1.1 mmHg AV Peak Grad:   5.0 mmHg LVOT Vmax:      81.40 cm/s LVOT Vmean:     46.900 cm/s LVOT VTI:       0.107 m  AORTA Ao Root diam: 4.30 cm Ao Asc diam:  3.30 cm MITRAL VALVE               TRICUSPID VALVE MV Area (PHT): 6.02 cm    TR Peak grad:   37.5 mmHg MV Decel Time: 126 msec    TR Vmax:        306.00 cm/s MR Peak grad: 56.1 mmHg MR Vmax:      374.50 cm/s  SHUNTS MV E velocity: 92.50 cm/s  Systemic VTI:  0.11 m MV A velocity: 30.70 cm/s  Systemic Diam: 2.00 cm MV E/A ratio:  3.01 Dalton McleanMD Electronically signed by Franki Monte Signature Date/Time: 09/13/2021/4:39:29 PM    Final    CT Angio Chest PE W and/or Wo Contrast  Result Date: 09/13/2021 CLINICAL DATA:  Evaluate for pulmonary embolism. High probability. Shortness of breath and chest tightness. EXAM: CT ANGIOGRAPHY CHEST WITH CONTRAST TECHNIQUE: Multidetector CT imaging of the chest was performed using the standard protocol during bolus administration of intravenous contrast. Multiplanar CT image reconstructions and MIPs were obtained to evaluate the vascular anatomy. RADIATION DOSE REDUCTION: This exam was performed according to the departmental dose-optimization program which includes automated exposure control, adjustment of the mA and/or kV according to patient size and/or use of iterative reconstruction technique. CONTRAST:  15m OMNIPAQUE IOHEXOL 350 MG/ML SOLN COMPARISON:  09/11/2021 FINDINGS: Cardiovascular: Satisfactory opacification of the pulmonary arteries to the segmental level. No evidence of pulmonary embolism. The heart size is enlarged. There is a left chest wall AICD with leads in the right atrial appendage and right ventricle. No pericardial effusion. Mild aortic  atherosclerosis and multi vessel coronary artery calcifications. Mediastinum/Nodes: No enlarged mediastinal, hilar, or axillary lymph nodes. Thyroid gland, trachea, and esophagus demonstrate no significant findings. Lungs/Pleura: There is a small right pleural effusion which appears similar to the previous exam. There is mild diffuse increase interlobular septal thickening. No airspace consolidation. No suspicious pulmonary nodule or mass. Upper Abdomen: There is reflux of contrast material into the IVC and hepatic veins compatible with right heart failure. Unchanged appearance of 1.6 cm left adrenal nodule compatible with a benign adenoma. No follow-up imaging recommended. Musculoskeletal: No chest wall abnormality. No acute or significant osseous findings. Review of the MIP images confirms the above findings. IMPRESSION: 1. No evidence for acute pulmonary embolus. 2. Cardiac enlargement, mild pulmonary edema and small right pleural effusion compatible with CHF. 3. Aortic Atherosclerosis (ICD10-I70.0). Multi vessel coronary artery calcifications. Electronically Signed   By: TKerby MoorsM.D.   On: 09/13/2021 06:16   DG Chest 2 View  Result  Date: 09/13/2021 CLINICAL DATA:  Shortness of breath and chest pain EXAM: CHEST - 2 VIEW COMPARISON:  04/10/2021 FINDINGS: Check shadow is enlarged. Defibrillator is again noted. Right chest wall port is again seen and stable. The lungs are clear. No bony abnormality is noted. IMPRESSION: No acute abnormality noted. Electronically Signed   By: Inez Catalina M.D.   On: 09/13/2021 03:11   CT CHEST ABDOMEN PELVIS W CONTRAST  Result Date: 09/11/2021 CLINICAL DATA:  Gastric cancer, monitor.  * Tracking Code: BO * EXAM: CT CHEST, ABDOMEN, AND PELVIS WITH CONTRAST TECHNIQUE: Multidetector CT imaging of the chest, abdomen and pelvis was performed following the standard protocol during bolus administration of intravenous contrast. RADIATION DOSE REDUCTION: This exam was performed  according to the departmental dose-optimization program which includes automated exposure control, adjustment of the mA and/or kV according to patient size and/or use of iterative reconstruction technique. CONTRAST:  139m OMNIPAQUE IOHEXOL 300 MG/ML  SOLN COMPARISON:  Multiple priors including most recent CTs dated May 31, 2021 April 13, 2021 and April 01, 2021. FINDINGS: CT CHEST FINDINGS Cardiovascular: Accessed right chest Port-A-Cath with tip in the SVC near the superior cavoatrial junction. Left chest AICD/pacemaker with leads in the right atrium, right ventricle and coronary sinus. Normal caliber thoracic aorta. Aortic atherosclerosis. No central pulmonary embolus on this nondedicated study. Four-chamber cardiomegaly. Coronary artery calcifications. No significant pericardial effusion/thickening. No suspicious thyroid nodule. Mediastinum/Nodes: No suspicious thyroid nodule. Prominent mediastinal lymph nodes are similar prior for instance a right paratracheal lymph node measuring 8 mm in short axis on image 17/2 and a low right paratracheal/precarinal lymph node measuring 8 mm on image 21/2. No pathologically enlarged mediastinal, hilar or axillary lymph nodes. Patulous esophagus with a small hiatal hernia. Lungs/Pleura: No suspicious pulmonary nodules or masses. Small right-greater-than-left pleural effusions. No pneumothorax. Musculoskeletal: No aggressive lytic or blastic lesion of bone. CT ABDOMEN PELVIS FINDINGS Hepatobiliary: No suspicious hepatic lesion. Gallbladder is unremarkable. No biliary ductal dilation. Pancreas: No pancreatic ductal dilation or evidence of acute inflammation. Spleen: No splenomegaly or focal splenic lesion. Adrenals/Urinary Tract: Similar lobular thickening of the right adrenal gland, favor hyperplasia. Left adrenal nodule measures 16 mm stable over multiple prior imaging studies with Hounsfield units of -3 on prior non contrasted PET-CT from February 29, 2019 compatible  with a benign adrenal adenoma requiring no additional imaging follow-up. No hydronephrosis. Kidneys demonstrate symmetric enhancement and excretion of contrast material. Mild symmetric wall thickening of an incompletely distended urinary bladder. Stomach/Bowel: Radiopaque enteric contrast material traverses the rectum. Stomach is minimally distended limiting evaluation. No pathologic dilation of small or large bowel. The appendix and terminal ileum appear normal. No evidence of acute bowel inflammation. Vascular/Lymphatic: Aortic atherosclerosis. No pathologically enlarged abdominal or pelvic lymph nodes. Reproductive: Fiducial markers in the prostate gland. Other: No significant abdominopelvic free fluid. Small fat containing right inguinal hernia. Small fat containing paraumbilical hernia. Musculoskeletal: No aggressive lytic or blastic lesion of bone. Bilateral L4 pars defects with similar grade 2, L4 on L5 anterolisthesis. Multilevel degenerative changes spine. Transitional type L5 vertebral body. IMPRESSION: 1. Stable examination without new or progressive finding to suggest recurrent or active metastatic disease in the chest, abdomen or pelvis. 2. Stable benign 16 mm left adrenal adenoma which requires no imaging follow-up. 3. Four-chamber cardiomegaly with small right-greater-than-left pleural effusions. 4.  Aortic Atherosclerosis (ICD10-I70.0). Electronically Signed   By: JDahlia BailiffM.D.   On: 09/11/2021 13:20   Labs:   Basic Metabolic Panel: Recent Labs  Lab 09/13/21  0246 09/13/21 0454 09/14/21 0233 09/15/21 0324  NA 139  --  138 138  K 3.6  --  3.7 4.0  CL 110  --  106 104  CO2 22  --  25 24  GLUCOSE 120*  --  106* 102*  BUN 14  --  15 21  CREATININE 0.99  --  1.14 1.15  CALCIUM 9.0  --  8.8* 9.0  MG  --  1.9  --   --      CBC: Recent Labs  Lab 09/13/21 0246 09/14/21 0233  WBC 5.4 4.6  HGB 11.9* 11.0*  HCT 36.1* 33.1*  MCV 96.5 93.2  PLT 164 151          SIGNED:   Debbe Odea, MD  [facility] 09/18/2021, 4:37 PM

## 2021-09-21 DIAGNOSIS — Z66 Do not resuscitate: Secondary | ICD-10-CM | POA: Diagnosis not present

## 2021-09-21 DIAGNOSIS — I4819 Other persistent atrial fibrillation: Secondary | ICD-10-CM | POA: Diagnosis not present

## 2021-09-21 DIAGNOSIS — M792 Neuralgia and neuritis, unspecified: Secondary | ICD-10-CM | POA: Diagnosis not present

## 2021-09-21 DIAGNOSIS — I5043 Acute on chronic combined systolic (congestive) and diastolic (congestive) heart failure: Secondary | ICD-10-CM | POA: Diagnosis not present

## 2021-09-21 DIAGNOSIS — E7800 Pure hypercholesterolemia, unspecified: Secondary | ICD-10-CM | POA: Diagnosis not present

## 2021-09-21 DIAGNOSIS — D6869 Other thrombophilia: Secondary | ICD-10-CM | POA: Diagnosis not present

## 2021-09-21 DIAGNOSIS — C169 Malignant neoplasm of stomach, unspecified: Secondary | ICD-10-CM | POA: Diagnosis not present

## 2021-09-21 DIAGNOSIS — C61 Malignant neoplasm of prostate: Secondary | ICD-10-CM | POA: Diagnosis not present

## 2021-09-25 ENCOUNTER — Other Ambulatory Visit: Payer: Self-pay | Admitting: Hematology

## 2021-09-25 NOTE — Progress Notes (Signed)
HEART & VASCULAR TRANSITION OF CARE CONSULT NOTE     Referring Physician: Primary Care: Primary Cardiologist:  HPI: Referred to clinic by *** for heart failure consultation.   71 y.o. male with a history of non-obstructive CAD on cardiac catheterization in 2018, non-ischemic cardiomyopathy/ chronic combined CHF with EF of 20-25% on Echo in 03/2021 s/p ICD in 05/2016, persistent atrial fibrillation/flutter on Xarelto, LBBB,  gastric cancer, and GI bleed who was admitted on 09/13/2021 for acute on chronic CHF after presenting with chest pain and shortness of breath.   Ventricular-paced rhythm, underlying afib, rate controlled      Cardiac Testing  Echocardiogram 09/13/2021: Impressions: 1. Left ventricular ejection fraction, by estimation, is 20 to 25%. The  left ventricle has severely decreased function. The left ventricle  demonstrates global hypokinesis. The left ventricular internal cavity size  was moderately dilated. Left  ventricular diastolic parameters are consistent with Grade III diastolic  dysfunction (restrictive).   2. Right ventricular systolic function is mildly reduced. The right  ventricular size is normal. There is moderately elevated pulmonary artery  systolic pressure. The estimated right ventricular systolic pressure is  84.1 mmHg.   3. Left atrial size was severely dilated.   4. The mitral valve is abnormal. Moderate to severe mitral valve  regurgitation, likely functional mitral regurgitation with dilated  annulus. No evidence of mitral stenosis.   5. Tricuspid valve regurgitation is mild to moderate.   6. The aortic valve is tricuspid. Aortic valve regurgitation is not  visualized. No aortic stenosis is present.   7. The inferior vena cava is dilated in size with >50% respiratory  variability, suggesting right atrial pressure of 8 mmHg.   Review of Systems: [y] = yes, '[ ]'$  = no   General: Weight gain '[ ]'$ ; Weight loss '[ ]'$ ; Anorexia '[ ]'$ ; Fatigue '[ ]'$ ;  Fever '[ ]'$ ; Chills '[ ]'$ ; Weakness '[ ]'$   Cardiac: Chest pain/pressure '[ ]'$ ; Resting SOB '[ ]'$ ; Exertional SOB '[ ]'$ ; Orthopnea '[ ]'$ ; Pedal Edema '[ ]'$ ; Palpitations '[ ]'$ ; Syncope '[ ]'$ ; Presyncope '[ ]'$ ; Paroxysmal nocturnal dyspnea'[ ]'$   Pulmonary: Cough '[ ]'$ ; Wheezing'[ ]'$ ; Hemoptysis'[ ]'$ ; Sputum '[ ]'$ ; Snoring '[ ]'$   GI: Vomiting'[ ]'$ ; Dysphagia'[ ]'$ ; Melena'[ ]'$ ; Hematochezia '[ ]'$ ; Heartburn'[ ]'$ ; Abdominal pain '[ ]'$ ; Constipation '[ ]'$ ; Diarrhea '[ ]'$ ; BRBPR '[ ]'$   GU: Hematuria'[ ]'$ ; Dysuria '[ ]'$ ; Nocturia'[ ]'$   Vascular: Pain in legs with walking '[ ]'$ ; Pain in feet with lying flat '[ ]'$ ; Non-healing sores '[ ]'$ ; Stroke '[ ]'$ ; TIA '[ ]'$ ; Slurred speech '[ ]'$ ;  Neuro: Headaches'[ ]'$ ; Vertigo'[ ]'$ ; Seizures'[ ]'$ ; Paresthesias'[ ]'$ ;Blurred vision '[ ]'$ ; Diplopia '[ ]'$ ; Vision changes '[ ]'$   Ortho/Skin: Arthritis '[ ]'$ ; Joint pain '[ ]'$ ; Muscle pain '[ ]'$ ; Joint swelling '[ ]'$ ; Back Pain '[ ]'$ ; Rash '[ ]'$   Psych: Depression'[ ]'$ ; Anxiety'[ ]'$   Heme: Bleeding problems '[ ]'$ ; Clotting disorders '[ ]'$ ; Anemia '[ ]'$   Endocrine: Diabetes '[ ]'$ ; Thyroid dysfunction'[ ]'$    Past Medical History:  Diagnosis Date   AICD (automatic cardioverter/defibrillator) present 05/25/2016   biv icd   Bunion, right    Chronic combined systolic and diastolic CHF (congestive heart failure) ([facility])    Echo 1/18: Mild conc LVH, EF 15-20, severe diff HK, inf and inf-septal AK, Gr 3 DD, mild to mod MR, severe LAE, mod reduced RVSF, mod RAE, mild TR, PASP 50   Coronary artery disease involving native coronary artery without angina pectoris 04/17/2016  LHC 1/18: pLCx 30, mLCx 20, mRCA 40, dRCA 20, LVEDP 23, mean RA 8, PA 42/20, PCWP 17   Diabetes mellitus without complication (HCC)    DJD (degenerative joint disease)    Gastric cancer (HCC)    History of atrial fibrillation    History of atrial flutter    History of cardiomegaly 06/07/2016   Noted on CXR   History of colon polyps 06/28/2017   Noted on colonoscopy   LBBB (left bundle branch block)    NICM (nonischemic cardiomyopathy) ([place])    Echo 1/18:  Mild  conc LVH, EF 15-20, severe diff HK, inf and inf-septal AK, Gr 3 DD, mild to mod MR, severe LAE, mod reduced RVSF, mod RAE, mild TR, PASP 50   OA (osteoarthritis)    knee   Other secondary pulmonary hypertension ([place]) 04/17/2016   Prostate cancer ([place]) 2019   Sigmoid diverticulosis 06/28/2017   Noted on colonoscopy    Current Outpatient Medications  Medication Sig Dispense Refill   atorvastatin (LIPITOR) 40 MG tablet Take 1 tablet (40 mg total) by mouth daily. 90 tablet 3   Biotin 1000 MCG tablet Take 1,000 mcg by mouth daily with breakfast.     cholecalciferol (VITAMIN D3) 25 MCG (1000 UT) tablet Take 1,000 Units by mouth daily with breakfast.     clotrimazole-betamethasone (LOTRISONE) cream Apply 1 Application topically 2 (two) times daily.     empagliflozin (JARDIANCE) 10 MG TABS tablet Take 1 tablet (10 mg total) by mouth daily. 30 tablet 0   furosemide (LASIX) 40 MG tablet Take 1/2 tablet by mouth daily, may take 1/2 tablet extra daily for fluid (Patient taking differently: Take 20 mg by mouth daily.) 90 tablet 3   gabapentin (NEURONTIN) 100 MG capsule Take 1 capsule (100 mg total) by mouth 2 (two) times daily. 60 capsule 2   metFORMIN (GLUCOPHAGE-XR) 500 MG 24 hr tablet Take 500 mg by mouth every evening.     metoprolol succinate (TOPROL-XL) 25 MG 24 hr tablet TAKE 1 TABLET(25 MG) BY MOUTH DAILY (Patient taking differently: Take 25 mg by mouth daily.) 90 tablet 3   multivitamin (ONE-A-DAY MEN'S) TABS tablet Take 1 tablet by mouth daily with breakfast.     pantoprazole (PROTONIX) 40 MG tablet Take 1 tablet (40 mg total) by mouth 2 (two) times daily. 60 tablet 0   potassium chloride (KLOR-CON) 10 MEQ tablet Twice daily 180 tablet 0   rivaroxaban (XARELTO) 20 MG TABS tablet Take 1 tablet (20 mg total) by mouth daily with supper. 30 tablet 0   sacubitril-valsartan (ENTRESTO) 24-26 MG Take 1 tablet by mouth 2 (two) times daily. 180 tablet 3   sotalol (BETAPACE) 80 MG tablet Take 1.5 tablets  (120 mg total) by mouth in the morning and at bedtime. 270 tablet 2   No current facility-administered medications for this visit.    Allergies  Allergen Reactions   Aspirin Anaphylaxis and Hives   Sulfa Antibiotics Anaphylaxis, Hives, Swelling and Other (See Comments)    Swollen lips      Social History   Socioeconomic History   Marital status: Married    Spouse name: Not on file   Number of children: 2   Years of education: Not on file   Highest education level: Not on file  Occupational History   Occupation: retired  Tobacco Use   Smoking status: Never   Smokeless tobacco: Never  Vaping Use   Vaping Use: Never used  Substance and Sexual Activity  Alcohol use: No   Drug use: No   Sexual activity: Not Currently  Other Topics Concern   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 Daughter, Beckie Busing, lives in [place]. Son, Hermitage, lives in [place].   Social Determinants of Health   Financial Resource Strain: Not on file  Food Insecurity: Not on file  Transportation Needs: Not on file  Physical Activity: Not on file  Stress: Not on file  Social Connections: Not on file  Intimate Partner Violence: Not on file      Family History  Problem Relation Age of Onset   Hypertension Mother    Heart disease Mother    Diabetes Mother    Diabetes Father    Hypertension Father    Cancer Father        lung cancer   Healthy Sister    Heart attack Brother    Heart disease Brother 70       + tobacco   Healthy Brother     There were no vitals filed for this visit.  PHYSICAL EXAM: General:  Well appearing. No respiratory difficulty HEENT: normal Neck: supple. no JVD. Carotids 2+ bilat; no bruits. No lymphadenopathy or thryomegaly appreciated. Cor: PMI nondisplaced. Regular rate & rhythm. No rubs, gallops or murmurs. Lungs: clear Abdomen: soft, nontender, nondistended. No hepatosplenomegaly. No bruits or masses.  Good bowel sounds. Extremities: no cyanosis, clubbing, rash, edema Neuro: alert & oriented x 3, cranial nerves grossly intact. moves all 4 extremities w/o difficulty. Affect pleasant.  ECG:   ASSESSMENT & PLAN:  [facility] *** GDMT  Diuretic- BB- Ace/ARB/ARNI MRA SGLT2i    Referred to HFSW (PCP, Medications, Transportation, ETOH Abuse, Drug Abuse, Insurance, Financial ): Yes or No Refer to Pharmacy: Yes or No Refer to Home Health: Yes on No Refer to Advanced Heart Failure Clinic: Yes or no  Refer to General Cardiology: Yes or No  Follow up

## 2021-09-26 ENCOUNTER — Telehealth (HOSPITAL_COMMUNITY): Payer: Self-pay

## 2021-09-26 ENCOUNTER — Encounter (HOSPITAL_COMMUNITY): Payer: Self-pay

## 2021-09-26 ENCOUNTER — Ambulatory Visit (HOSPITAL_COMMUNITY)
Admit: 2021-09-26 | Discharge: 2021-09-26 | Disposition: A | Discharge status: Home / Self Care | Payer: Medicare HMO | Attending: Cardiology | Admitting: Cardiology

## 2021-09-26 ENCOUNTER — Telehealth (HOSPITAL_COMMUNITY): Payer: Self-pay | Admitting: *Deleted

## 2021-09-26 ENCOUNTER — Telehealth (HOSPITAL_COMMUNITY): Payer: Self-pay | Admitting: Surgery

## 2021-09-26 VITALS — BP 98/70 | HR 67 | Wt 187.8 lb

## 2021-09-26 DIAGNOSIS — R0602 Shortness of breath: Secondary | ICD-10-CM | POA: Diagnosis not present

## 2021-09-26 DIAGNOSIS — I482 Chronic atrial fibrillation, unspecified: Secondary | ICD-10-CM | POA: Insufficient documentation

## 2021-09-26 DIAGNOSIS — Z66 Do not resuscitate: Secondary | ICD-10-CM | POA: Insufficient documentation

## 2021-09-26 DIAGNOSIS — R079 Chest pain, unspecified: Secondary | ICD-10-CM | POA: Insufficient documentation

## 2021-09-26 DIAGNOSIS — Z7984 Long term (current) use of oral hypoglycemic drugs: Secondary | ICD-10-CM | POA: Diagnosis not present

## 2021-09-26 DIAGNOSIS — I428 Other cardiomyopathies: Secondary | ICD-10-CM | POA: Insufficient documentation

## 2021-09-26 DIAGNOSIS — I447 Left bundle-branch block, unspecified: Secondary | ICD-10-CM | POA: Diagnosis not present

## 2021-09-26 DIAGNOSIS — I081 Rheumatic disorders of both mitral and tricuspid valves: Secondary | ICD-10-CM | POA: Diagnosis not present

## 2021-09-26 DIAGNOSIS — Z8500 Personal history of malignant neoplasm of unspecified digestive organ: Secondary | ICD-10-CM | POA: Diagnosis not present

## 2021-09-26 DIAGNOSIS — Z7901 Long term (current) use of anticoagulants: Secondary | ICD-10-CM | POA: Insufficient documentation

## 2021-09-26 DIAGNOSIS — Z8546 Personal history of malignant neoplasm of prostate: Secondary | ICD-10-CM | POA: Diagnosis not present

## 2021-09-26 DIAGNOSIS — I2510 Atherosclerotic heart disease of native coronary artery without angina pectoris: Secondary | ICD-10-CM | POA: Diagnosis not present

## 2021-09-26 DIAGNOSIS — E11621 Type 2 diabetes mellitus with foot ulcer: Secondary | ICD-10-CM | POA: Diagnosis not present

## 2021-09-26 DIAGNOSIS — Z95810 Presence of automatic (implantable) cardiac defibrillator: Secondary | ICD-10-CM | POA: Insufficient documentation

## 2021-09-26 DIAGNOSIS — I5022 Chronic systolic (congestive) heart failure: Secondary | ICD-10-CM

## 2021-09-26 LAB — BASIC METABOLIC PANEL
Anion gap: 7 (ref 5–15)
BUN: 14 mg/dL (ref 8–23)
CO2: 23 mmol/L (ref 22–32)
Calcium: 9.4 mg/dL (ref 8.9–10.3)
Chloride: 110 mmol/L (ref 98–111)
Creatinine, Ser: 1.34 mg/dL — ABNORMAL HIGH (ref 0.61–1.24)
GFR, Estimated: 56 mL/min — ABNORMAL LOW (ref >60)
Glucose, Bld: 101 mg/dL — ABNORMAL HIGH (ref 70–99)
Potassium: 3.9 mmol/L (ref 3.5–5.1)
Sodium: 140 mmol/L (ref 135–145)

## 2021-09-26 LAB — BRAIN NATRIURETIC PEPTIDE: B Natriuretic Peptide: 644.9 pg/mL — ABNORMAL HIGH (ref 0.0–100.0)

## 2021-09-26 NOTE — Patient Instructions (Signed)
Great to see you today!!!  Continue current medications  Labs done today, your results will be available in MyChart, we will contact you for abnormal readings.  Do the following things EVERYDAY: Weigh yourself in the morning before breakfast. Write it down and keep it in a log. Take your medicines as prescribed Eat low salt foods--Limit salt (sodium) to 2000 mg per day.  Stay as active as you can everyday Limit all fluids for the day to less than 2 liters  Thank you for allowing [place] to provider your heart failure care after your recent hospitalization. Please follow-up with [facility] in about 4 weeks, they will call you to schedule an appointment

## 2021-09-26 NOTE — Telephone Encounter (Signed)
Called to confirm Heart & Vascular Transitions of Care appointment at 9 am on 09/26/2021. Patient reminded to bring all medications and pill box organizer with them. Confirmed patient has transportation. Gave directions, instructed to utilize [place] parking.  Confirmed appointment prior to ending call.    Earnestine Leys, BSN, Clinical cytogeneticist Only

## 2021-09-26 NOTE — Telephone Encounter (Signed)
Patient assistance application for Jardiance initiated. Patient portion completed and signed. Faxed to [facility] for provider completion and submission.  Kerby Nora, PharmD, BCPS Heart Failure Stewardship Pharmacist Phone [phone]

## 2021-09-26 NOTE — Telephone Encounter (Signed)
I called patient and reviewed results and recommendations per provider. Patient aware and agreeable to plan.

## 2021-09-26 NOTE — Telephone Encounter (Signed)
-----   Message from Consuelo Pandy, [place] sent at 09/26/2021  3:39 PM EDT ----- Fluid marker elevated. Recommend he increase lasix to 40 mg daily x 2 days, then back to 20 mg daily.

## 2021-09-28 ENCOUNTER — Other Ambulatory Visit (HOSPITAL_BASED_OUTPATIENT_CLINIC_OR_DEPARTMENT_OTHER): Payer: Self-pay

## 2021-09-28 ENCOUNTER — Telehealth (HOSPITAL_BASED_OUTPATIENT_CLINIC_OR_DEPARTMENT_OTHER): Payer: Self-pay

## 2021-09-28 ENCOUNTER — Ambulatory Visit: Payer: Medicare HMO | Attending: Internal Medicine | Admitting: Occupational Therapy

## 2021-09-28 DIAGNOSIS — R2681 Unsteadiness on feet: Secondary | ICD-10-CM | POA: Diagnosis not present

## 2021-09-28 DIAGNOSIS — M6281 Muscle weakness (generalized): Secondary | ICD-10-CM | POA: Insufficient documentation

## 2021-09-28 DIAGNOSIS — R208 Other disturbances of skin sensation: Secondary | ICD-10-CM | POA: Insufficient documentation

## 2021-09-28 DIAGNOSIS — R278 Other lack of coordination: Secondary | ICD-10-CM | POA: Diagnosis not present

## 2021-09-28 NOTE — Telephone Encounter (Signed)
Pharmacy Transitions of Care Follow-up Telephone Call  Date of discharge: 09/15/21  Discharge Diagnosis: CHF  How have you been since you were released from the hospital? Patient has been doing well. Xarelto was restarted, but he is familiar with how to take it and what to look out for. They are interested in patient assistance for Jardiance and they think that the process has already been started, but doesn't know who to reach out to.    Medication changes made at discharge: START taking these medications  START taking these medications  Jardiance 10 MG Tabs tablet Generic drug: empagliflozin Take 1 tablet (10 mg total) by mouth daily.  Xarelto 20 MG Tabs tablet Generic drug: rivaroxaban Take 1 tablet (20 mg total) by mouth daily with supper.   CHANGE how you take these medications  CHANGE how you take these medications  furosemide 40 MG tablet Commonly known as: LASIX Take 1/2 tablet by mouth daily, may take 1/2 tablet extra daily for fluid What changed:  how much to take how to take this when to take this additional instructions  metoprolol succinate 25 MG 24 hr tablet Commonly known as: TOPROL-XL TAKE 1 TABLET(25 MG) BY MOUTH DAILY What changed: See the new instructions.   CONTINUE taking these medications  CONTINUE taking these medications  atorvastatin 40 MG tablet Commonly known as: LIPITOR Take 1 tablet (40 mg total) by mouth daily.  Biotin 1000 MCG tablet  cholecalciferol 25 MCG (1000 UNIT) tablet Commonly known as: VITAMIN D3  Entresto 24-26 MG Generic drug: sacubitril-valsartan Take 1 tablet by mouth 2 (two) times daily.  metFORMIN 500 MG 24 hr tablet Commonly known as: GLUCOPHAGE-XR  multivitamin Tabs tablet  pantoprazole 40 MG tablet Commonly known as: PROTONIX Take 1 tablet (40 mg total) by mouth 2 (two) times daily.  potassium chloride 10 MEQ tablet Commonly known as: KLOR-CON Twice daily  sotalol 80 MG tablet Commonly known as: BETAPACE Take  1.5 tablets (120 mg total) by mouth in the morning and at bedtime.    Medication changes verified by the patient? yes     Medication Accessibility:  Home Pharmacy: [facility]   Was the patient provided with refills on discharged medications? no   Have all prescriptions been transferred from [facility] to home pharmacy? N/a   Is the patient interested in using a [place]? no  Is the patient able to afford medications? no  Referred patient to patient care advocate for medication assistance? yes  Medication Review:  RIVAROXABAN (XARELTO) - PTA med Rivaroxaban 20 mg QD.   - Discussed importance of taking medication around the same time every day.  - Advised patient of medications to avoid (NSAIDs, ASA maintenance doses>100 mg daily)  - Educated that Tylenol (acetaminophen) will be the preferred analgesic to prevent risk of bleeding. - Emphasized importance of monitoring for signs and symptoms of bleeding (abnormal bruising, prolonged bleeding, nose bleeds, bleeding from gums, discolored urine, black tarry stools)  - Advised patient to alert all providers of anticoagulation therapy prior to starting a new medication or having a procedure.  Follow-up Appointments: Date Visit Type Length Department    10/03/2021 10:15 AM NEURO OT TREATMENT 45 min [facility] [[id number]]  Patient Instructions:            10/05/2021  9:30 AM NEURO OT TREATMENT 45 min [facility] [[id number]]  Patient Instructions:            10/09/2021  7:05 AM HOME REMOTE DEFIB  CK 5 min [place] [[id number]]  Patient Instructions:            10/10/2021  9:30 AM NEURO OT TREATMENT 45 min [facility] [[id number]]  Patient Instructions:            10/12/2021  9:30 AM NEURO OT TREATMENT 45 min [facility] [[id number]]  Patient Instructions:            10/17/2021  8:45 AM NEURO OT TREATMENT 45 min [facility] [[id number]]  Patient Instructions:            10/19/2021  8:45 AM NEURO OT TREATMENT 45 min [facility] [[id number]]  Patient Instructions:            10/24/2021  9:30 AM NEURO OT TREATMENT 45 min [facility] [[id number]]  Patient Instructions:            10/26/2021 11:00 AM OFFICE VISIT 20 min [facility] [[id number]]  Patient Instructions:   Please arrive 15 minutes prior to your appointment. This will allow [place] to verify and update your medical record and ensure a full appointment for you within the time allotted.         10/26/2021  2:00 PM NEURO OT TREATMENT 45 min [facility] [[id number]]  Patient Instructions:    If their condition worsens, is the pt aware to call PCP or go to the Emergency Dept.? yes  Final Patient Assessment: Patient has been doing well. Xarelto was restarted, but he is familiar with how to take it and what to look out for. They are interested in patient assistance for Jardiance and they think that the process has already been started, but doesn't know who to reach out to.    Darcus Austin, PharmD Clinical Pharmacist [facility] 09/28/2021 3:34 PM

## 2021-09-28 NOTE — Therapy (Signed)
OUTPATIENT OCCUPATIONAL THERAPY NEURO EVALUATION  Patient Name: Jon Williams MRN: [id number] DOB:10/30/1950, 71 y.o., male Today's Date: 09/28/2021  PCP: [name], MD REFERRING PROVIDER: Debbe Odea, MD    OT End of Session - 09/28/21 1322     Visit Number 1    Number of Visits 13    Date for OT Re-Evaluation 11/24/21    Authorization Type [employer]    OT Start Time 1027    OT Stop Time 2536    OT Time Calculation (min) 39 min             Past Medical History:  Diagnosis Date   AICD (automatic cardioverter/defibrillator) present 05/25/2016   biv icd   Bunion, right    Chronic combined systolic and diastolic CHF (congestive heart failure) ([place])    Echo 1/18: Mild conc LVH, EF 15-20, severe diff HK, inf and inf-septal AK, Gr 3 DD, mild to mod MR, severe LAE, mod reduced RVSF, mod RAE, mild TR, PASP 50   Coronary artery disease involving native coronary artery without angina pectoris 04/17/2016   LHC 1/18: pLCx 30, mLCx 20, mRCA 40, dRCA 20, LVEDP 23, mean RA 8, PA 42/20, PCWP 17   Diabetes mellitus without complication (HCC)    DJD (degenerative joint disease)    Gastric cancer ([place])    History of atrial fibrillation    History of atrial flutter    History of cardiomegaly 06/07/2016   Noted on CXR   History of colon polyps 06/28/2017   Noted on colonoscopy   LBBB (left bundle branch block)    NICM (nonischemic cardiomyopathy) ([place])    Echo 1/18:  Mild conc LVH, EF 15-20, severe diff HK, inf and inf-septal AK, Gr 3 DD, mild to mod MR, severe LAE, mod reduced RVSF, mod RAE, mild TR, PASP 50   OA (osteoarthritis)    knee   Other secondary pulmonary hypertension ([place]) 04/17/2016   Prostate cancer ([place]) 2019   Sigmoid diverticulosis 06/28/2017   Noted on colonoscopy   Past Surgical History:  Procedure Laterality Date   BIOPSY  12/01/2020   Procedure: BIOPSY;  Surgeon: [name], MD;  Location: [place];  Service: Endoscopy;;   BIOPSY   06/09/2021   Procedure: BIOPSY;  Surgeon: [name], MD;  Location: [facility];  Service: Gastroenterology;;   BIV ICD INSERTION CRT-D N/A 05/25/2016   Procedure: BiV ICD Insertion CRT-D;  Surgeon: [name], MD;  Location: [facility] [place];  Service: Cardiovascular;  Laterality: N/A;   CARDIAC CATHETERIZATION N/A 03/02/2016   Procedure: Right/Left Heart Cath and Coronary Angiography;  Surgeon: Nelva Bush, MD;  Location: [place] [facility];  Service: Cardiovascular;  Laterality: N/A;   CARDIOVERSION N/A 07/17/2016   Procedure: Cardioversion;  Surgeon: [name], MD;  Location: [facility] [facility];  Service: Cardiovascular;  Laterality: N/A;   COLONOSCOPY WITH PROPOFOL N/A 06/28/2017   Procedure: COLONOSCOPY WITH PROPOFOL;  Surgeon: [name], MD;  Location: [facility];  Service: Endoscopy;  Laterality: N/A;   colonscopy  2009   ESOPHAGOGASTRODUODENOSCOPY N/A 12/01/2020   Procedure: ESOPHAGOGASTRODUODENOSCOPY (EGD);  Surgeon: [name], MD;  Location: [place];  Service: Endoscopy;  Laterality: N/A;  IDA/guaiac positive stools   ESOPHAGOGASTRODUODENOSCOPY (EGD) WITH PROPOFOL N/A 12/16/2020   Procedure: ESOPHAGOGASTRODUODENOSCOPY (EGD) WITH PROPOFOL;  Surgeon: [name], MD;  [facility]: [facility];  Service: Endoscopy;  Laterality: N/A;   ESOPHAGOGASTRODUODENOSCOPY (EGD) WITH PROPOFOL N/A 06/09/2021   Procedure: ESOPHAGOGASTRODUODENOSCOPY (EGD) WITH PROPOFOL;  Surgeon:  [name], MD;  Location: [facility];  Service: Gastroenterology;  Laterality: N/A;   GOLD SEED IMPLANT N/A 12/24/2017   Procedure: GOLD SEED IMPLANT, TRANSERINEAL;  Surgeon: Festus Aloe, MD;  Location: [facility];  Service: Urology;  Laterality: N/A;   INSERT / REPLACE / REMOVE PACEMAKER     LEAD REVISION  10/10/2018   LEAD REVISION/REPAIR N/A 10/10/2018   Procedure: LEAD REVISION/REPAIR;  Surgeon: [name], MD;  Location: [facility];  Service: Cardiovascular;  Laterality:  N/A;   POLYPECTOMY  06/28/2017   Procedure: POLYPECTOMY;  Surgeon: [name], MD;  [facility];  Service: Endoscopy;;  ascending and descending colon polyp   PORTACATH PLACEMENT Right 12/23/2020   Procedure: INSERTION PORT-A-CATH;  Surgeon: [name], MD;  Location: [place];  Service: General;  Laterality: Right;   PROSTATE BIOPSY  02/20/2017   SPACE OAR INSTILLATION N/A 12/24/2017   Procedure: SPACE OAR INSTILLATION;  Surgeon: Festus Aloe, MD;  Location: [facility];  Service: Urology;  Laterality: N/A;   TOTAL KNEE ARTHROPLASTY Right 08/16/2017   Procedure: RIGHT TOTAL KNEE ARTHROPLASTY;  Surgeon: [name], MD;  Location: [place];  Service: Orthopedics;  Laterality: Right;   UPPER ESOPHAGEAL ENDOSCOPIC ULTRASOUND (EUS) N/A 12/16/2020   Procedure: UPPER ESOPHAGEAL ENDOSCOPIC ULTRASOUND (EUS);  Surgeon: [name], MD;  Location: [facility];  Service: Endoscopy;  Laterality: N/A;   Patient Active Problem List   Diagnosis Date Noted   Malnutrition of moderate degree 09/15/2021   DM (diabetes mellitus), type 2 ([place]) 09/14/2021   Essential hypertension 09/13/2021   Dyslipidemia 09/13/2021   DNR (do not resuscitate) 09/13/2021   Port-A-Cath in place 06/15/2021   Hypotension due to hypovolemia 04/04/2021   Nausea vomiting and diarrhea 04/04/2021   Thrombocytopenia ([place]) 04/04/2021   Leukocytosis 04/04/2021   Acute renal failure superimposed on stage 3a chronic kidney disease ([place]) 04/04/2021   Diarrhea 04/01/2021   Occult blood in stools 04/01/2021   Foot ulcer, right ([place]) 04/01/2021   Hypotension 04/01/2021   Dehydration 04/01/2021   Iron deficiency anemia due to chronic blood loss 12/12/2020   Gastric cancer ([place]) 12/08/2020   Symptomatic anemia 11/30/2020   Gastrointestinal hemorrhage    Corns and callosities 08/07/2019   Blood clotting disorder ([place]) 05/06/2019   Coagulation defect ([place]) 05/06/2019   Pacemaker lead malfunction 10/10/2018   Pacemaker  complications 62/37/6283   Pacemaker lead failure, sequela 10/10/2018   S/P knee replacement 08/16/2017   S/P total knee arthroplasty 08/16/2017   Malignant neoplasm of prostate ([place]) 04/02/2017   ICD (implantable cardioverter-defibrillator) in place    Persistent atrial fibrillation ([place]) 05/25/2016   Atrial fibrillation ([place]) 05/25/2016   Coronary artery disease involving native coronary artery without angina pectoris 04/17/2016   Other secondary pulmonary hypertension ([place]) 04/17/2016   Acute on chronic combined systolic and diastolic CHF (congestive heart failure) (HCC)    NICM (nonischemic cardiomyopathy) ([place])    LBBB (left bundle branch block)     ONSET DATE: noted onset of increased symptoms and loss of function during chemo around February 2023 (REFERRAL Date: 09/15/21)  REFERRING DIAG: I50.9 (ICD-10-CM) - CHF (congestive heart failure)  THERAPY DIAG:  Muscle weakness (generalized)  Other lack of coordination  Unsteadiness on feet  Other disturbances of skin sensation  Rationale for Evaluation and Treatment Rehabilitation  SUBJECTIVE:   SUBJECTIVE STATEMENT: Pt reports fingers are stiffer in the mornings, but that he tries to keep them loose.  Reports decreased coordination and difficulty with holding utensils during  self-feeding.   Pt accompanied by: self and significant other ([place])  PERTINENT HISTORY: Had a recent bout of chemo for his gastric tumor, ICD, CAD, a-fib, gastric CA, GI bleed, CHF, hypotension, LVEF 20-25%,   PRECAUTIONS: Fall  WEIGHT BEARING RESTRICTIONS No  PAIN:  Are you having pain? Yes: NPRS scale: 6/10 Pain location: fingers Pain description: stiffness, tight Aggravating factors: [place] tasks Relieving factors: rest  FALLS: Has patient fallen in last 6 months? No  LIVING ENVIRONMENT: Lives with: lives with their spouse Lives in: House/apartment Stairs: Yes: External: 4 steps; can reach both Does have a downstairs/basement but does not  need to go down stairs Has following equipment at home: Walker - 4 wheeled, Wheelchair (manual), shower chair, bed side commode, and Grab bars  PLOF: Independent  PATIENT GOALS to become independent again  OBJECTIVE:   HAND DOMINANCE: Right  ADLs: Eating: decreased control of utensil, is currently using built up handles Grooming: Mod I UB Dressing: typically wears elastic waist and pull over shirts LB Dressing: wife assists with setup of items, pt requires increased time, now wearing elastic waist pants and slip on shoes Toileting: Mod I, requires increased time for hygiene post toileting Bathing: Setup for bathing with wife applying wash mitts and adjusting water, pt able to wash body Tub Shower transfers: tub/shower transfer with supervision from spouse, has tub transfer bench and grab bars Equipment: Transfer tub bench, Grab bars, and bed side commode   IADLs: Spouse currently completing all IADLs Community mobility: spouse is driving at this time Medication management: spouse administers meds Handwriting: 50% legible  MOBILITY STATUS: Needs Assist: Supervision - Mod I with Rollator  POSTURE COMMENTS:  No Significant postural limitations  ACTIVITY TOLERANCE: Activity tolerance: WNL for tasks assessed during evaluation  FUNCTIONAL OUTCOME MEASURES: Physical performance test: PPT #2: 24.72*  UPPER EXTREMITY ROM     Active ROM Right eval Left eval  Shoulder flexion 130 126  Shoulder abduction    Shoulder adduction    Shoulder extension    Shoulder internal rotation    Shoulder external rotation    Elbow flexion    Elbow extension    Wrist flexion    Wrist extension    Wrist ulnar deviation    Wrist radial deviation    Wrist pronation    Wrist supination    (Blank rows = not tested)   UPPER EXTREMITY MMT:     MMT Right eval Left eval  Shoulder flexion 4+/5 4+/5  Shoulder abduction    Shoulder adduction    Shoulder extension    Shoulder internal  rotation    Shoulder external rotation    Middle trapezius    Lower trapezius    Elbow flexion 4+/5 4+/5  Elbow extension 4+/5 4+/5  Wrist flexion    Wrist extension    Wrist ulnar deviation    Wrist radial deviation    Wrist pronation    Wrist supination    (Blank rows = not tested)   COORDINATION: Finger Nose Finger test: slow bilaterally, required demonstration for technique Box and Blocks:  Right 25 blocks, Left 13 blocks  SENSATION: Light touch: Impaired  Stereognosis: Impaired  Hot/Cold: Impaired   COGNITION: Overall cognitive status:  Pt required demonstration and multimodal cues throughout evaluation, question further cognitive impairments.  VISION: Subjective report: s/p chemo pt with increased watery eyes, now wearing glasses instead of contacts Baseline vision: Wears glasses all the time   TODAY'S TREATMENT:  N/A   PATIENT EDUCATION: Education details:  Educated on role and purpose of OT as well as potential interventions and goals for therapy based on initial evaluation findings. Person educated: Patient and Spouse Education method: Explanation Education comprehension: verbalized understanding   HOME EXERCISE PROGRAM: TBD    GOALS: Goals reviewed with patient? No  SHORT TERM GOALS: Target date: 10/27/21  Pt will be independent with HEP for [facility] to increase coordination as needed for ADLs and IADLs. Baseline: Goal status: INITIAL  2.  Pt will demonstrate improved UE functional use for ADLs as evidenced by increasing box/ blocks score by 3 blocks with LUE Baseline: Right 25 blocks, Left 13 blocks Goal status: INITIAL  3.  Pt will demonstrate ability to retrieve a lightweight object with BUE at moderate height range to demonstrate increased UE strength as needed to complete ADLs and IADLs.  Baseline: R shoulder 130* and L shoulder 124* Goal status: INITIAL    LONG TERM GOALS: Target date: 11/24/21  Pt will demonstrate improved UE functional  use for ADLs as evidenced by increasing box/ blocks score by 6 blocks with LUE Baseline: Right 25 blocks, Left 13 blocks Baseline:  Goal status: INITIAL  2.  Pt will demonstrate improved ease with feeding as evidenced by decreasing PPT#2 (self feeding) by 3 secs Baseline: PPT #2: 24.72 Goal status: INITIAL  3.  Pt will verbalize understanding of strategies, safety consideration, and AE needs to ensure safety due to decreased sensation.  Baseline:  Goal status: INITIAL  4.  Pt will verbalize improved ROM and motor control with ability to complete hygiene post toileting  Baseline:  Goal status: INITIAL   ASSESSMENT:  CLINICAL IMPRESSION: Patient is a 71 y.o. male who was seen today for occupational therapy evaluation for decreased hand dexterity following chemo.  Pt currently lives with spouse in a home with 4 steps to enter and is able to remain on main floor, does not have to go down to basement level. PMHx includes ICD, CAD, a-fib, gastric CA, GI bleed, CHF, hypotension, LVEF 20-25%. Pt will benefit from skilled occupational therapy services to address strength and coordination, ROM, pain management, altered sensation, balance, GM/FM control, cognition, safety awareness, introduction of compensatory strategies/AE prn, and implementation of an HEP to improve participation and safety during ADLs and IADLs.   PERFORMANCE DEFICITS in functional skills including ADLs, IADLs, coordination, dexterity, proprioception, sensation, ROM, strength, pain, flexibility, FMC, GMC, mobility, balance, body mechanics, endurance, cardiopulmonary status limiting function, decreased knowledge of use of DME, skin integrity, and UE functional use.  IMPAIRMENTS are limiting patient from ADLs and IADLs.   COMORBIDITIES may have co-morbidities  that affects occupational performance. Patient will benefit from skilled OT to address above impairments and improve overall function.  MODIFICATION OR ASSISTANCE TO  COMPLETE EVALUATION: Min-Moderate modification of tasks or assist with assess necessary to complete an evaluation.  OT OCCUPATIONAL PROFILE AND HISTORY: Detailed assessment: Review of records and additional review of physical, cognitive, psychosocial history related to current functional performance.  CLINICAL DECISION MAKING: Moderate - several treatment options, min-mod task modification necessary  REHAB POTENTIAL: Good  EVALUATION COMPLEXITY: Moderate    PLAN: OT FREQUENCY: 1-2x/week 2x/week initially and may decrease to 1x/week pending progress  OT DURATION: 8 weeks  PLANNED INTERVENTIONS: self care/ADL training, therapeutic exercise, therapeutic activity, neuromuscular re-education, manual therapy, passive range of motion, balance training, functional mobility training, paraffin, fluidotherapy, moist heat, patient/family education, energy conservation, and DME and/or AE instructions  RECOMMENDED OTHER SERVICES: NA  CONSULTED AND AGREED WITH PLAN OF CARE: Patient  and family member/caregiver  PLAN FOR NEXT SESSION: educate on impaired sensation/awareness, initiate coordination exercises   Bubber Rothert, [place], OTR/L 09/28/2021, 4:55 PM

## 2021-10-02 ENCOUNTER — Telehealth: Payer: Self-pay | Admitting: Hematology

## 2021-10-02 NOTE — Telephone Encounter (Signed)
Per 8/20 in basket, pt has been called and wife confirmed appt

## 2021-10-03 ENCOUNTER — Ambulatory Visit: Payer: Medicare HMO | Admitting: Occupational Therapy

## 2021-10-03 DIAGNOSIS — M6281 Muscle weakness (generalized): Secondary | ICD-10-CM | POA: Diagnosis not present

## 2021-10-03 DIAGNOSIS — R208 Other disturbances of skin sensation: Secondary | ICD-10-CM | POA: Diagnosis not present

## 2021-10-03 DIAGNOSIS — R2681 Unsteadiness on feet: Secondary | ICD-10-CM | POA: Diagnosis not present

## 2021-10-03 DIAGNOSIS — R278 Other lack of coordination: Secondary | ICD-10-CM

## 2021-10-03 NOTE — Telephone Encounter (Signed)
**Note De-Identified Alie Hardgrove Obfuscation** [employer] pt asst application for [employer] received Keiasia Christianson fax. I have completed the providers page of the application and have e-mailed the pts entire application to Dr Lanny Hurst nurse so she can obtain his signature, date it, and to fax all to [employer].

## 2021-10-03 NOTE — Therapy (Signed)
OUTPATIENT OCCUPATIONAL THERAPY NEURO  Treatment Session Patient Name: CAPERS HAGMANN MRN: [id number] DOB:01-18-1951, 71 y.o., male Today's Date: 10/03/2021  PCP: [name], MD REFERRING PROVIDER: Debbe Odea, MD    OT End of Session - 10/03/21 1059     Visit Number 2    Number of Visits 13    Date for OT Re-Evaluation 11/24/21    Authorization Type [employer]    OT Start Time 1017    OT Stop Time 1101    OT Time Calculation (min) 44 min              Past Medical History:  Diagnosis Date   AICD (automatic cardioverter/defibrillator) present 05/25/2016   biv icd   Bunion, right    Chronic combined systolic and diastolic CHF (congestive heart failure) ([place])    Echo 1/18: Mild conc LVH, EF 15-20, severe diff HK, inf and inf-septal AK, Gr 3 DD, mild to mod MR, severe LAE, mod reduced RVSF, mod RAE, mild TR, PASP 50   Coronary artery disease involving native coronary artery without angina pectoris 04/17/2016   LHC 1/18: pLCx 30, mLCx 20, mRCA 40, dRCA 20, LVEDP 23, mean RA 8, PA 42/20, PCWP 17   Diabetes mellitus without complication (HCC)    DJD (degenerative joint disease)    Gastric cancer ([place])    History of atrial fibrillation    History of atrial flutter    History of cardiomegaly 06/07/2016   Noted on CXR   History of colon polyps 06/28/2017   Noted on colonoscopy   LBBB (left bundle branch block)    NICM (nonischemic cardiomyopathy) ([place])    Echo 1/18:  Mild conc LVH, EF 15-20, severe diff HK, inf and inf-septal AK, Gr 3 DD, mild to mod MR, severe LAE, mod reduced RVSF, mod RAE, mild TR, PASP 50   OA (osteoarthritis)    knee   Other secondary pulmonary hypertension ([place]) 04/17/2016   Prostate cancer ([place]) 2019   Sigmoid diverticulosis 06/28/2017   Noted on colonoscopy   Past Surgical History:  Procedure Laterality Date   BIOPSY  12/01/2020   Procedure: BIOPSY;  Surgeon: [name], MD;  Location: [facility];  Service: Endoscopy;;   BIOPSY   06/09/2021   Procedure: BIOPSY;  Surgeon: [name], MD;  Location: [facility];  Service: Gastroenterology;;   BIV ICD INSERTION CRT-D N/A 05/25/2016   Procedure: BiV ICD Insertion CRT-D;  Surgeon: [name], MD;  Location: [facility];  Service: Cardiovascular;  Laterality: N/A;   CARDIAC CATHETERIZATION N/A 03/02/2016   Procedure: Right/Left Heart Cath and Coronary Angiography;  Surgeon: Nelva Bush, MD;  Location: [facility];  Service: Cardiovascular;  Laterality: N/A;   CARDIOVERSION N/A 07/17/2016   Procedure: Cardioversion;  Surgeon: [name], MD;  Location: [facility];  Service: Cardiovascular;  Laterality: N/A;   COLONOSCOPY WITH PROPOFOL N/A 06/28/2017   Procedure: COLONOSCOPY WITH PROPOFOL;  Surgeon: [name], MD;  [facility]: [facility];  Service: Endoscopy;  Laterality: N/A;   colonscopy  2009   ESOPHAGOGASTRODUODENOSCOPY N/A 12/01/2020   Procedure: ESOPHAGOGASTRODUODENOSCOPY (EGD);  Surgeon: [name], MD;  Location: [facility];  Service: Endoscopy;  Laterality: N/A;  IDA/guaiac positive stools   ESOPHAGOGASTRODUODENOSCOPY (EGD) WITH PROPOFOL N/A 12/16/2020   Procedure: ESOPHAGOGASTRODUODENOSCOPY (EGD) WITH PROPOFOL;  Surgeon: [name], MD;  [facility];  Service: Endoscopy;  Laterality: N/A;   ESOPHAGOGASTRODUODENOSCOPY (EGD) WITH PROPOFOL N/A 06/09/2021   Procedure: ESOPHAGOGASTRODUODENOSCOPY (EGD) WITH PROPOFOL;  Surgeon: [name], MD;  Location: [facility];  Service: Gastroenterology;  Laterality: N/A;   GOLD SEED IMPLANT N/A 12/24/2017   Procedure: GOLD SEED IMPLANT, TRANSERINEAL;  Surgeon: Festus Aloe, MD;  [facility];  Service: Urology;  Laterality: N/A;   INSERT / REPLACE / REMOVE PACEMAKER     LEAD REVISION  10/10/2018   LEAD REVISION/REPAIR N/A 10/10/2018   Procedure: LEAD REVISION/REPAIR;  Surgeon: [name], MD;  Location: [facility];  Service: Cardiovascular;  Laterality:  N/A;   POLYPECTOMY  06/28/2017   Procedure: POLYPECTOMY;  Surgeon: [name], MD;  [facility];  Service: Endoscopy;;  ascending and descending colon polyp   PORTACATH PLACEMENT Right 12/23/2020   Procedure: INSERTION PORT-A-CATH;  Surgeon: [name], MD;  Location: [facility];  Service: General;  Laterality: Right;   PROSTATE BIOPSY  02/20/2017   SPACE OAR INSTILLATION N/A 12/24/2017   Procedure: SPACE OAR INSTILLATION;  Surgeon: Festus Aloe, MD;  [facility];  Service: Urology;  Laterality: N/A;   TOTAL KNEE ARTHROPLASTY Right 08/16/2017   Procedure: RIGHT TOTAL KNEE ARTHROPLASTY;  Surgeon: [name], MD;  Location: [facility];  Service: Orthopedics;  Laterality: Right;   UPPER ESOPHAGEAL ENDOSCOPIC ULTRASOUND (EUS) N/A 12/16/2020   Procedure: UPPER ESOPHAGEAL ENDOSCOPIC ULTRASOUND (EUS);  Surgeon: [name], MD;  Location: [facility];  Service: Endoscopy;  Laterality: N/A;   Patient Active Problem List   Diagnosis Date Noted   Malnutrition of moderate degree 09/15/2021   DM (diabetes mellitus), type 2 ([place]) 09/14/2021   Essential hypertension 09/13/2021   Dyslipidemia 09/13/2021   DNR (do not resuscitate) 09/13/2021   Port-A-Cath in place 06/15/2021   Hypotension due to hypovolemia 04/04/2021   Nausea vomiting and diarrhea 04/04/2021   Thrombocytopenia ([place]) 04/04/2021   Leukocytosis 04/04/2021   Acute renal failure superimposed on stage 3a chronic kidney disease ([place]) 04/04/2021   Diarrhea 04/01/2021   Occult blood in stools 04/01/2021   Foot ulcer, right ([place]) 04/01/2021   Hypotension 04/01/2021   Dehydration 04/01/2021   Iron deficiency anemia due to chronic blood loss 12/12/2020   Gastric cancer ([place]) 12/08/2020   Symptomatic anemia 11/30/2020   Gastrointestinal hemorrhage    Corns and callosities 08/07/2019   Blood clotting disorder ([place]) 05/06/2019   Coagulation defect ([place]) 05/06/2019   Pacemaker lead malfunction 10/10/2018   Pacemaker  complications 67/20/9470   Pacemaker lead failure, sequela 10/10/2018   S/P knee replacement 08/16/2017   S/P total knee arthroplasty 08/16/2017   Malignant neoplasm of prostate ([place]) 04/02/2017   ICD (implantable cardioverter-defibrillator) in place    Persistent atrial fibrillation ([place]) 05/25/2016   Atrial fibrillation ([place]) 05/25/2016   Coronary artery disease involving native coronary artery without angina pectoris 04/17/2016   Other secondary pulmonary hypertension ([place]) 04/17/2016   Acute on chronic combined systolic and diastolic CHF (congestive heart failure) (HCC)    NICM (nonischemic cardiomyopathy) ([place])    LBBB (left bundle branch block)     ONSET DATE: noted onset of increased symptoms and loss of function during chemo around February 2023 (REFERRAL Date: 09/15/21)  REFERRING DIAG: I50.9 (ICD-10-CM) - CHF (congestive heart failure)  THERAPY DIAG:  Other disturbances of skin sensation  Other lack of coordination  Muscle weakness (generalized)  Unsteadiness on feet  Rationale for Evaluation and Treatment Rehabilitation  SUBJECTIVE:   SUBJECTIVE STATEMENT: Pt reports fingers feels like pins and needles.   Pt accompanied by: self and significant other ([place])  PERTINENT HISTORY: Had a recent bout  of chemo for his gastric tumor, ICD, CAD, a-fib, gastric CA, GI bleed, CHF, hypotension, LVEF 20-25%,   PRECAUTIONS: Fall  WEIGHT BEARING RESTRICTIONS No  PAIN:  Are you having pain? Yes: NPRS scale: 5/10 Pain location: fingers Pain description: pins and needles Aggravating factors: [place] tasks Relieving factors: rest   PATIENT GOALS to become independent again  OBJECTIVE:   HAND DOMINANCE: Right  ADLs: Eating: decreased control of utensil, is currently using built up handles Grooming: Mod I UB Dressing: typically wears elastic waist and pull over shirts LB Dressing: wife assists with setup of items, pt requires increased time, now wearing elastic waist  pants and slip on shoes Toileting: Mod I, requires increased time for hygiene post toileting Bathing: Setup for bathing with wife applying wash mitts and adjusting water, pt able to wash body Tub Shower transfers: tub/shower transfer with supervision from spouse, has tub transfer bench and grab bars Equipment: Transfer tub bench, Grab bars, and bed side commode  FUNCTIONAL OUTCOME MEASURES: Physical performance test: PPT #2: 24.72*  UPPER EXTREMITY ROM     Active ROM Right eval Left eval  Shoulder flexion 130 126  Shoulder abduction    Shoulder adduction    Shoulder extension    Shoulder internal rotation    Shoulder external rotation    Elbow flexion    Elbow extension    Wrist flexion    Wrist extension    Wrist ulnar deviation    Wrist radial deviation    Wrist pronation    Wrist supination    (Blank rows = not tested)   UPPER EXTREMITY MMT:     MMT Right eval Left eval  Shoulder flexion 4+/5 4+/5  Shoulder abduction    Shoulder adduction    Shoulder extension    Shoulder internal rotation    Shoulder external rotation    Middle trapezius    Lower trapezius    Elbow flexion 4+/5 4+/5  Elbow extension 4+/5 4+/5  Wrist flexion    Wrist extension    Wrist ulnar deviation    Wrist radial deviation    Wrist pronation    Wrist supination    (Blank rows = not tested)   COORDINATION: Finger Nose Finger test: slow bilaterally, required demonstration for technique Box and Blocks:  Right 25 blocks, Left 13 blocks  SENSATION: Light touch: Impaired  Stereognosis: Impaired  Hot/Cold: Impaired   COGNITION: Overall cognitive status:  Pt required demonstration and multimodal cues throughout evaluation, question further cognitive impairments.  VISION: Subjective report: s/p chemo pt with increased watery eyes, now wearing glasses instead of contacts Baseline vision: Wears glasses all the time   TODAY'S TREATMENT:  Stereognosis: Pt able to correctly identify 1  of 3 objects with increased time.  Pt able to correctly identify spoon, but unable to identify pen or key.  Pt demonstrating decreased coordination when attempting to manipulate items in hand to identify with vision occluded. Educated on sensation and loss of feeling. Provided handout with recommendations and reiterating use of vision for compensation of decreased sensation as well as checking temperature before bathing or washing dishes. Coordination: Engaged in picking up variety of small items with pt demonstrating increased success with items with increased thickness to allow for grasp and manipulation.  Pt frequently utilizing thumb and long finger when attempting to pick up items.  Pt dropping 10% of items and requiring more than reasonable amount of time when attempting to pick up.  Engaged in [place] and dealing of cards  for coordination. Pt demonstrating increased difficulty due to thin nature of cards, frequently sliding fingers off of cards when attempting to grasp and occasionally grasping more than one at a time.  Educated on variety of coordination tasks to complete at home with variety of items and to engage in functional tasks as well with focus on coordination.    PATIENT EDUCATION: Education details: Provided handouts on sensation impairments and coordination HEP Person educated: Patient and Spouse Education method: Explanation, Demonstration, and Handouts Education comprehension: verbalized understanding   HOME EXERCISE PROGRAM: Coordination exercises - See pt instructions    GOALS: Goals reviewed with patient? No  SHORT TERM GOALS: Target date: 10/27/21  Pt will be independent with HEP for [facility] to increase coordination as needed for ADLs and IADLs. Baseline: Goal status: IN PROGRESS  2.  Pt will demonstrate improved UE functional use for ADLs as evidenced by increasing box/ blocks score by 3 blocks with LUE Baseline: Right 25 blocks, Left 13 blocks Goal status: IN  PROGRESS  3.  Pt will demonstrate ability to retrieve a lightweight object with BUE at moderate height range to demonstrate increased UE strength as needed to complete ADLs and IADLs.  Baseline: R shoulder 130* and L shoulder 124* Goal status: IN PROGRESS    LONG TERM GOALS: Target date: 11/24/21  Pt will demonstrate improved UE functional use for ADLs as evidenced by increasing box/ blocks score by 6 blocks with LUE Baseline: Right 25 blocks, Left 13 blocks Baseline:  Goal status: IN PROGRESS  2.  Pt will demonstrate improved ease with feeding as evidenced by decreasing PPT#2 (self feeding) by 3 secs Baseline: PPT #2: 24.72 Goal status: IN PROGRESS  3.  Pt will verbalize understanding of strategies, safety consideration, and AE needs to ensure safety due to decreased sensation.  Baseline:  Goal status: IN PROGRESS  4.  Pt will verbalize improved ROM and motor control with ability to complete hygiene post toileting  Baseline:  Goal status: IN PROGRESS   ASSESSMENT:  CLINICAL IMPRESSION: Pt seen for first treatment session s/p evaluation.  Therapist discussed goals with pt and spouse, both in agreement with goals.  Pt continues to demonstrate impaired coordination, sensation, and even mobility of digits.  Pt dropping items during coordination tasks and frequently picking up more than one card due to decreased coordination and sensation.  Pt receptive to education about impaired sensation and safety strategies and use of vision to compensate for impaired sensation.  PERFORMANCE DEFICITS in functional skills including ADLs, IADLs, coordination, dexterity, proprioception, sensation, ROM, strength, pain, flexibility, FMC, GMC, mobility, balance, body mechanics, endurance, cardiopulmonary status limiting function, decreased knowledge of use of DME, skin integrity, and UE functional use.  IMPAIRMENTS are limiting patient from ADLs and IADLs.   COMORBIDITIES may have co-morbidities  that  affects occupational performance. Patient will benefit from skilled OT to address above impairments and improve overall function.  MODIFICATION OR ASSISTANCE TO COMPLETE EVALUATION: Min-Moderate modification of tasks or assist with assess necessary to complete an evaluation.  OT OCCUPATIONAL PROFILE AND HISTORY: Detailed assessment: Review of records and additional review of physical, cognitive, psychosocial history related to current functional performance.  CLINICAL DECISION MAKING: Moderate - several treatment options, min-mod task modification necessary  REHAB POTENTIAL: Good  EVALUATION COMPLEXITY: Moderate    PLAN: OT FREQUENCY: 1-2x/week 2x/week initially and may decrease to 1x/week pending progress  OT DURATION: 8 weeks  PLANNED INTERVENTIONS: self care/ADL training, therapeutic exercise, therapeutic activity, neuromuscular re-education, manual therapy, passive range  of motion, balance training, functional mobility training, paraffin, fluidotherapy, moist heat, patient/family education, energy conservation, and DME and/or AE instructions  RECOMMENDED OTHER SERVICES: NA  CONSULTED AND AGREED WITH PLAN OF CARE: Patient and family member/caregiver  PLAN FOR NEXT SESSION: review coordination exercises and add as appropriate, provide strengthening HEP/theraputty HEP   Devion Chriscoe, [place], OTR/L 10/03/2021, 11:00 AM

## 2021-10-03 NOTE — Patient Instructions (Signed)
  Coordination Activities  Perform the following activities for 15 minutes 1 times per day with both hand(s).  Flip cards 1 at a time as fast as you can. Play a card game: hold cards in hand, deal cards, select appropriate card, etc. Pick up coins, buttons, marbles, dried beans/pasta of different sizes and place in container. Pick up coins and place in container or coin bank. Pick up coins and stack. Twirl pen between fingers.

## 2021-10-05 ENCOUNTER — Telehealth: Payer: Self-pay | Admitting: Cardiovascular Disease

## 2021-10-05 ENCOUNTER — Ambulatory Visit: Payer: Medicare HMO | Admitting: Occupational Therapy

## 2021-10-05 DIAGNOSIS — E1169 Type 2 diabetes mellitus with other specified complication: Secondary | ICD-10-CM | POA: Diagnosis not present

## 2021-10-05 DIAGNOSIS — R2681 Unsteadiness on feet: Secondary | ICD-10-CM

## 2021-10-05 DIAGNOSIS — R208 Other disturbances of skin sensation: Secondary | ICD-10-CM | POA: Diagnosis not present

## 2021-10-05 DIAGNOSIS — R278 Other lack of coordination: Secondary | ICD-10-CM

## 2021-10-05 DIAGNOSIS — M6281 Muscle weakness (generalized): Secondary | ICD-10-CM | POA: Diagnosis not present

## 2021-10-05 DIAGNOSIS — E7800 Pure hypercholesterolemia, unspecified: Secondary | ICD-10-CM | POA: Diagnosis not present

## 2021-10-05 DIAGNOSIS — N400 Enlarged prostate without lower urinary tract symptoms: Secondary | ICD-10-CM | POA: Diagnosis not present

## 2021-10-05 DIAGNOSIS — I5043 Acute on chronic combined systolic (congestive) and diastolic (congestive) heart failure: Secondary | ICD-10-CM | POA: Diagnosis not present

## 2021-10-05 NOTE — Therapy (Signed)
OUTPATIENT OCCUPATIONAL THERAPY NEURO  Treatment Session Patient Name: Jon Williams MRN: [id number] DOB:01/23/1951, 71 y.o., male Today's Date: 10/05/2021  PCP: [name], MD REFERRING PROVIDER: Debbe Odea, MD    OT End of Session - 10/05/21 0946     Visit Number 3    Number of Visits 13    Date for OT Re-Evaluation 11/24/21    Authorization Type [employer]    OT Start Time [phone]    OT Stop Time 1016    OT Time Calculation (min) 40 min               Past Medical History:  Diagnosis Date   AICD (automatic cardioverter/defibrillator) present 05/25/2016   biv icd   Bunion, right    Chronic combined systolic and diastolic CHF (congestive heart failure) ([place])    Echo 1/18: Mild conc LVH, EF 15-20, severe diff HK, inf and inf-septal AK, Gr 3 DD, mild to mod MR, severe LAE, mod reduced RVSF, mod RAE, mild TR, PASP 50   Coronary artery disease involving native coronary artery without angina pectoris 04/17/2016   LHC 1/18: pLCx 30, mLCx 20, mRCA 40, dRCA 20, LVEDP 23, mean RA 8, PA 42/20, PCWP 17   Diabetes mellitus without complication (HCC)    DJD (degenerative joint disease)    Gastric cancer ([place])    History of atrial fibrillation    History of atrial flutter    History of cardiomegaly 06/07/2016   Noted on CXR   History of colon polyps 06/28/2017   Noted on colonoscopy   LBBB (left bundle branch block)    NICM (nonischemic cardiomyopathy) ([place])    Echo 1/18:  Mild conc LVH, EF 15-20, severe diff HK, inf and inf-septal AK, Gr 3 DD, mild to mod MR, severe LAE, mod reduced RVSF, mod RAE, mild TR, PASP 50   OA (osteoarthritis)    knee   Other secondary pulmonary hypertension ([place]) 04/17/2016   Prostate cancer ([place]) 2019   Sigmoid diverticulosis 06/28/2017   Noted on colonoscopy   Past Surgical History:  Procedure Laterality Date   BIOPSY  12/01/2020   Procedure: BIOPSY;  Surgeon: [name], MD;  Location: [place];  Service: Endoscopy;;    BIOPSY  06/09/2021   Procedure: BIOPSY;  Surgeon: [name], MD;  Location: [facility];  Service: Gastroenterology;;   BIV ICD INSERTION CRT-D N/A 05/25/2016   Procedure: BiV ICD Insertion CRT-D;  Surgeon: [name], MD;  Location: [place] [facility];  Service: Cardiovascular;  Laterality: N/A;   CARDIAC CATHETERIZATION N/A 03/02/2016   Procedure: Right/Left Heart Cath and Coronary Angiography;  Surgeon: Nelva Bush, MD;  Location: [place] [facility];  Service: Cardiovascular;  Laterality: N/A;   CARDIOVERSION N/A 07/17/2016   Procedure: Cardioversion;  Surgeon: [name], MD;  Location: [place] [facility];  Service: Cardiovascular;  Laterality: N/A;   COLONOSCOPY WITH PROPOFOL N/A 06/28/2017   Procedure: COLONOSCOPY WITH PROPOFOL;  Surgeon: [name], MD;  Location: [facility];  Service: Endoscopy;  Laterality: N/A;   colonscopy  2009   ESOPHAGOGASTRODUODENOSCOPY N/A 12/01/2020   Procedure: ESOPHAGOGASTRODUODENOSCOPY (EGD);  Surgeon: [name], MD;  Location: [place];  Service: Endoscopy;  Laterality: N/A;  IDA/guaiac positive stools   ESOPHAGOGASTRODUODENOSCOPY (EGD) WITH PROPOFOL N/A 12/16/2020   Procedure: ESOPHAGOGASTRODUODENOSCOPY (EGD) WITH PROPOFOL;  Surgeon: [name], MD;  Location: [facility];  Service: Endoscopy;  Laterality: N/A;   ESOPHAGOGASTRODUODENOSCOPY (EGD) WITH PROPOFOL N/A 06/09/2021   Procedure: ESOPHAGOGASTRODUODENOSCOPY (EGD) WITH  PROPOFOL;  Surgeon: [name], MD;  Location: [facility];  Service: Gastroenterology;  Laterality: N/A;   GOLD SEED IMPLANT N/A 12/24/2017   Procedure: GOLD SEED IMPLANT, TRANSERINEAL;  Surgeon: Festus Aloe, MD;  Location: [facility];  Service: Urology;  Laterality: N/A;   INSERT / REPLACE / REMOVE PACEMAKER     LEAD REVISION  10/10/2018   LEAD REVISION/REPAIR N/A 10/10/2018   Procedure: LEAD REVISION/REPAIR;  Surgeon: [name], MD;  Location: [place] [facility];  Service: Cardiovascular;   Laterality: N/A;   POLYPECTOMY  06/28/2017   Procedure: POLYPECTOMY;  Surgeon: [name], MD;  [facility];  Service: Endoscopy;;  ascending and descending colon polyp   PORTACATH PLACEMENT Right 12/23/2020   Procedure: INSERTION PORT-A-CATH;  Surgeon: [name], MD;  Location: [place];  Service: General;  Laterality: Right;   PROSTATE BIOPSY  02/20/2017   SPACE OAR INSTILLATION N/A 12/24/2017   Procedure: SPACE OAR INSTILLATION;  Surgeon: Festus Aloe, MD;  Location: [facility];  Service: Urology;  Laterality: N/A;   TOTAL KNEE ARTHROPLASTY Right 08/16/2017   Procedure: RIGHT TOTAL KNEE ARTHROPLASTY;  Surgeon: [name], MD;  Location: [place];  Service: Orthopedics;  Laterality: Right;   UPPER ESOPHAGEAL ENDOSCOPIC ULTRASOUND (EUS) N/A 12/16/2020   Procedure: UPPER ESOPHAGEAL ENDOSCOPIC ULTRASOUND (EUS);  Surgeon: [name], MD;  Location: [facility];  Service: Endoscopy;  Laterality: N/A;   Patient Active Problem List   Diagnosis Date Noted   Malnutrition of moderate degree 09/15/2021   DM (diabetes mellitus), type 2 ([place]) 09/14/2021   Essential hypertension 09/13/2021   Dyslipidemia 09/13/2021   DNR (do not resuscitate) 09/13/2021   Port-A-Cath in place 06/15/2021   Hypotension due to hypovolemia 04/04/2021   Nausea vomiting and diarrhea 04/04/2021   Thrombocytopenia ([place]) 04/04/2021   Leukocytosis 04/04/2021   Acute renal failure superimposed on stage 3a chronic kidney disease ([place]) 04/04/2021   Diarrhea 04/01/2021   Occult blood in stools 04/01/2021   Foot ulcer, right ([place]) 04/01/2021   Hypotension 04/01/2021   Dehydration 04/01/2021   Iron deficiency anemia due to chronic blood loss 12/12/2020   Gastric cancer ([place]) 12/08/2020   Symptomatic anemia 11/30/2020   Gastrointestinal hemorrhage    Corns and callosities 08/07/2019   Blood clotting disorder ([place]) 05/06/2019   Coagulation defect ([place]) 05/06/2019   Pacemaker lead malfunction 10/10/2018    Pacemaker complications 24/23/5361   Pacemaker lead failure, sequela 10/10/2018   S/P knee replacement 08/16/2017   S/P total knee arthroplasty 08/16/2017   Malignant neoplasm of prostate ([place]) 04/02/2017   ICD (implantable cardioverter-defibrillator) in place    Persistent atrial fibrillation ([place]) 05/25/2016   Atrial fibrillation ([place]) 05/25/2016   Coronary artery disease involving native coronary artery without angina pectoris 04/17/2016   Other secondary pulmonary hypertension ([place]) 04/17/2016   Acute on chronic combined systolic and diastolic CHF (congestive heart failure) (HCC)    NICM (nonischemic cardiomyopathy) ([place])    LBBB (left bundle branch block)     ONSET DATE: noted onset of increased symptoms and loss of function during chemo around February 2023 (REFERRAL Date: 09/15/21)  REFERRING DIAG: I50.9 (ICD-10-CM) - CHF (congestive heart failure)  THERAPY DIAG:  Other disturbances of skin sensation  Other lack of coordination  Muscle weakness (generalized)  Unsteadiness on feet  Rationale for Evaluation and Treatment Rehabilitation  SUBJECTIVE:   SUBJECTIVE STATEMENT: Pt reports stiffness until he starts moving around. Pt accompanied by: self and significant other ([place])  PERTINENT HISTORY: Had a recent bout  of chemo for his gastric tumor, ICD, CAD, a-fib, gastric CA, GI bleed, CHF, hypotension, LVEF 20-25%,   PRECAUTIONS: Fall  WEIGHT BEARING RESTRICTIONS No  PAIN:  Are you having pain? Yes: NPRS scale: 5/10 Pain location: fingers Pain description: pins and needles Aggravating factors: [place] tasks Relieving factors: rest   PATIENT GOALS to become independent again  OBJECTIVE:   HAND DOMINANCE: Right  ADLs: Eating: decreased control of utensil, is currently using built up handles Grooming: Mod I UB Dressing: typically wears elastic waist and pull over shirts LB Dressing: wife assists with setup of items, pt requires increased time, now wearing  elastic waist pants and slip on shoes Toileting: Mod I, requires increased time for hygiene post toileting Bathing: Setup for bathing with wife applying wash mitts and adjusting water, pt able to wash body Tub Shower transfers: tub/shower transfer with supervision from spouse, has tub transfer bench and grab bars Equipment: Transfer tub bench, Grab bars, and bed side commode  FUNCTIONAL OUTCOME MEASURES: Physical performance test: PPT #2: 24.72*  UPPER EXTREMITY ROM     Active ROM Right eval Left eval  Shoulder flexion 130 126  Shoulder abduction    Shoulder adduction    Shoulder extension    Shoulder internal rotation    Shoulder external rotation    Elbow flexion    Elbow extension    Wrist flexion    Wrist extension    Wrist ulnar deviation    Wrist radial deviation    Wrist pronation    Wrist supination    (Blank rows = not tested)   UPPER EXTREMITY MMT:     MMT Right eval Left eval  Shoulder flexion 4+/5 4+/5  Shoulder abduction    Shoulder adduction    Shoulder extension    Shoulder internal rotation    Shoulder external rotation    Middle trapezius    Lower trapezius    Elbow flexion 4+/5 4+/5  Elbow extension 4+/5 4+/5  Wrist flexion    Wrist extension    Wrist ulnar deviation    Wrist radial deviation    Wrist pronation    Wrist supination    (Blank rows = not tested)   COORDINATION: Finger Nose Finger test: slow bilaterally, required demonstration for technique Box and Blocks:  Right 25 blocks, Left 13 blocks  SENSATION: Light touch: Impaired  Stereognosis: Impaired  Hot/Cold: Impaired   COGNITION: Overall cognitive status:  Pt required demonstration and multimodal cues throughout evaluation, question further cognitive impairments.  VISION: Subjective report: s/p chemo pt with increased watery eyes, now wearing glasses instead of contacts Baseline vision: Wears glasses all the time   TODAY'S TREATMENT:  Resistance Clothespins 1,2,4#  with alternating RUE and LUE for mid and high functional reaching and sustained pinch. Pt req'd increased time and effort as well as mod cues for movement pattern and awareness of finger placement due to decreased sensation. Pt requiring increased effort LUE > RUE due to decreased coordination, strength, and sensation compared to R. Therapist providing intermittent hand over hand to allow for improved safety and hand placement as well as cues to visually attend to task to compensate fore impaired sensation. Theraputty: Engaged in full grasp strengthening, finger lumbricals "duck mouth", and thumb opposition.  Pt completed bilaterally with improved motor control and opposition with RUE compared to LUE.  Pt requiring increased visual attention to task when completing with LUE.  Therapist providing demonstration, verbal cues and min setup for improved success with finger lumbricals and  thumb opposition.  Provided with HEP and yellow theraputty.   PATIENT EDUCATION: Education details: Provided handouts/HEP for strengthening and impaired sensation Person educated: Patient and Spouse Education method: Explanation, Demonstration, and Handouts Education comprehension: verbalized understanding   HOME EXERCISE PROGRAM: Coordination exercises - See pt instructions  Access Code: [email or web address] URL: [email or web address] Date: 10/05/2021 Prepared by: [facility]  Exercises - Putty Squeezes  - 3 x weekly - 1 sets - 10 reps - Rolling Putty on Table  - 3 x weekly - 1 sets - 10 reps - Thumb Opposition with Putty  - 3 x weekly - 1 sets - 10 reps - "Duck mouth"  - 3 x weekly - 1 sets - 10 reps    GOALS: Goals reviewed with patient? Yes  SHORT TERM GOALS: Target date: 10/27/21  Pt will be independent with HEP for [facility] to increase coordination as needed for ADLs and IADLs. Baseline: Goal status: IN PROGRESS  2.  Pt will demonstrate improved UE functional use  for ADLs as evidenced by increasing box/ blocks score by 3 blocks with LUE Baseline: Right 25 blocks, Left 13 blocks Goal status: IN PROGRESS  3.  Pt will demonstrate ability to retrieve a lightweight object with BUE at moderate height range to demonstrate increased UE strength as needed to complete ADLs and IADLs.  Baseline: R shoulder 130* and L shoulder 124* Goal status: IN PROGRESS    LONG TERM GOALS: Target date: 11/24/21  Pt will demonstrate improved UE functional use for ADLs as evidenced by increasing box/ blocks score by 6 blocks with LUE Baseline: Right 25 blocks, Left 13 blocks Baseline:  Goal status: IN PROGRESS  2.  Pt will demonstrate improved ease with feeding as evidenced by decreasing PPT#2 (self feeding) by 3 secs Baseline: PPT #2: 24.72 Goal status: IN PROGRESS  3.  Pt will verbalize understanding of strategies, safety consideration, and AE needs to ensure safety due to decreased sensation.  Baseline:  Goal status: IN PROGRESS  4.  Pt will verbalize improved ROM and motor control with ability to complete hygiene post toileting  Baseline:  Goal status: IN PROGRESS   ASSESSMENT:  CLINICAL IMPRESSION: Treatment session with focus on visual attention to UE during strengthening and functional grasp activities. Pt continues to demonstrate impaired coordination, sensation, and even mobility of digits. Pt demonstrating decreased sensation and awareness of positioning of fingers during use of clothespins, requiring mod cues to visually attend to digits and to increase focus on quality of grasp.  Pt continues to demonstrate greater impairment L > R. Pt receptive to education about impaired sensation and safety strategies and use of vision to compensate for impaired sensation.  PERFORMANCE DEFICITS in functional skills including ADLs, IADLs, coordination, dexterity, proprioception, sensation, ROM, strength, pain, flexibility, FMC, GMC, mobility, balance, body mechanics,  endurance, cardiopulmonary status limiting function, decreased knowledge of use of DME, skin integrity, and UE functional use.  IMPAIRMENTS are limiting patient from ADLs and IADLs.   COMORBIDITIES may have co-morbidities  that affects occupational performance. Patient will benefit from skilled OT to address above impairments and improve overall function.  MODIFICATION OR ASSISTANCE TO COMPLETE EVALUATION: Min-Moderate modification of tasks or assist with assess necessary to complete an evaluation.  OT OCCUPATIONAL PROFILE AND HISTORY: Detailed assessment: Review of records and additional review of physical, cognitive, psychosocial history related to current functional performance.  CLINICAL DECISION MAKING: Moderate - several treatment options, min-mod task modification necessary  REHAB POTENTIAL: Good  EVALUATION COMPLEXITY: Moderate    PLAN: OT FREQUENCY: 1-2x/week 2x/week initially and may decrease to 1x/week pending progress  OT DURATION: 8 weeks  PLANNED INTERVENTIONS: self care/ADL training, therapeutic exercise, therapeutic activity, neuromuscular re-education, manual therapy, passive range of motion, balance training, functional mobility training, paraffin, fluidotherapy, moist heat, patient/family education, energy conservation, and DME and/or AE instructions  RECOMMENDED OTHER SERVICES: NA  CONSULTED AND AGREED WITH PLAN OF CARE: Patient and family member/caregiver  PLAN FOR NEXT SESSION: review coordination exercises and add as appropriate, provide strengthening HEP/theraputty HEP. Engage in functional grasp   [place], [place], OTR/L 10/05/2021, 9:46 AM

## 2021-10-05 NOTE — Telephone Encounter (Signed)
Called patient back about message. Patient needs refill on Jardiance. Patient was started medication from the hospital. Will send message to Dr. [name] to see if he can refill for patient.

## 2021-10-05 NOTE — Telephone Encounter (Signed)
*  STAT* If patient is at the pharmacy, call can be transferred to refill team.   1. Which medications need to be refilled? (please list name of each medication and dose if known) empagliflozin (JARDIANCE) 10 MG TABS tablet  2. Which pharmacy/location (including street and city if local pharmacy) is medication to be sent to? [facility] [phone] - [place], [address]  3. Do they need a 30 day or 90 day supply? [place]

## 2021-10-06 MED ORDER — EMPAGLIFLOZIN 10 MG PO TABS: 10.0000 mg | ORAL_TABLET | Freq: Every day | ORAL | 3 refills | Status: DC

## 2021-10-06 NOTE — Telephone Encounter (Signed)
Called patient back to let him know that his refill has been sent to his pharmacy.

## 2021-10-09 ENCOUNTER — Ambulatory Visit (INDEPENDENT_AMBULATORY_CARE_PROVIDER_SITE_OTHER): Payer: Medicare HMO

## 2021-10-09 DIAGNOSIS — I428 Other cardiomyopathies: Secondary | ICD-10-CM | POA: Diagnosis not present

## 2021-10-09 LAB — CUP PACEART REMOTE DEVICE CHECK
Battery Remaining Longevity: 25 mo
Battery Remaining Percentage: 28 %
Battery Voltage: 2.89 V
Brady Statistic AP VP Percent: 44 %
Brady Statistic AP VS Percent: 0 %
Brady Statistic AS VP Percent: 56 %
Brady Statistic AS VS Percent: 0 %
Brady Statistic RA Percent Paced: 1 %
Brady Statistic RV Percent Paced: 85 %
Date Time Interrogation Session: 20230828035335
HighPow Impedance: 60 Ohm
HighPow Impedance: 60 Ohm
Implantable Lead Implant Date: 20180413
Implantable Lead Implant Date: 20200828
Implantable Lead Location: 753859
Implantable Lead Location: 753860
Implantable Lead Model: 7122
Implantable Pulse Generator Implant Date: 20180413
Lead Channel Impedance Value: 390 Ohm
Lead Channel Impedance Value: 400 Ohm
Lead Channel Pacing Threshold Amplitude: 0.75 V
Lead Channel Pacing Threshold Amplitude: 0.75 V
Lead Channel Pacing Threshold Pulse Width: 0.5 ms
Lead Channel Pacing Threshold Pulse Width: 0.5 ms
Lead Channel Sensing Intrinsic Amplitude: 11.7 mV
Lead Channel Sensing Intrinsic Amplitude: 2.2 mV
Lead Channel Setting Pacing Amplitude: 2 V
Lead Channel Setting Pacing Amplitude: 2.5 V
Lead Channel Setting Pacing Pulse Width: 0.5 ms
Lead Channel Setting Sensing Sensitivity: 0.5 mV
Pulse Gen Serial Number: 7398151

## 2021-10-10 ENCOUNTER — Encounter: Payer: Self-pay | Admitting: Cardiology

## 2021-10-10 ENCOUNTER — Ambulatory Visit: Payer: Medicare HMO | Admitting: Occupational Therapy

## 2021-10-10 DIAGNOSIS — M6281 Muscle weakness (generalized): Secondary | ICD-10-CM | POA: Diagnosis not present

## 2021-10-10 DIAGNOSIS — R2681 Unsteadiness on feet: Secondary | ICD-10-CM | POA: Diagnosis not present

## 2021-10-10 DIAGNOSIS — R208 Other disturbances of skin sensation: Secondary | ICD-10-CM | POA: Diagnosis not present

## 2021-10-10 DIAGNOSIS — R278 Other lack of coordination: Secondary | ICD-10-CM | POA: Diagnosis not present

## 2021-10-10 NOTE — Therapy (Addendum)
OUTPATIENT OCCUPATIONAL THERAPY NEURO  Treatment Session Patient Name: Jon Williams MRN: [id number] DOB:04-29-1950, 71 y.o., male Today's Date: 10/10/2021  PCP: [name], MD REFERRING PROVIDER: Debbe Odea, MD    OT End of Session - 10/10/21 0950     Visit Number 4    Number of Visits 13    Date for OT Re-Evaluation 11/24/21    Authorization Type [employer]    OT Start Time 0935    OT Stop Time 6503    OT Time Calculation (min) 43 min                Past Medical History:  Diagnosis Date   AICD (automatic cardioverter/defibrillator) present 05/25/2016   biv icd   Bunion, right    Chronic combined systolic and diastolic CHF (congestive heart failure) ([place])    Echo 1/18: Mild conc LVH, EF 15-20, severe diff HK, inf and inf-septal AK, Gr 3 DD, mild to mod MR, severe LAE, mod reduced RVSF, mod RAE, mild TR, PASP 50   Coronary artery disease involving native coronary artery without angina pectoris 04/17/2016   LHC 1/18: pLCx 30, mLCx 20, mRCA 40, dRCA 20, LVEDP 23, mean RA 8, PA 42/20, PCWP 17   Diabetes mellitus without complication (HCC)    DJD (degenerative joint disease)    Gastric cancer ([place])    History of atrial fibrillation    History of atrial flutter    History of cardiomegaly 06/07/2016   Noted on CXR   History of colon polyps 06/28/2017   Noted on colonoscopy   LBBB (left bundle branch block)    NICM (nonischemic cardiomyopathy) ([place])    Echo 1/18:  Mild conc LVH, EF 15-20, severe diff HK, inf and inf-septal AK, Gr 3 DD, mild to mod MR, severe LAE, mod reduced RVSF, mod RAE, mild TR, PASP 50   OA (osteoarthritis)    knee   Other secondary pulmonary hypertension ([place]) 04/17/2016   Prostate cancer ([place]) 2019   Sigmoid diverticulosis 06/28/2017   Noted on colonoscopy   Past Surgical History:  Procedure Laterality Date   BIOPSY  12/01/2020   Procedure: BIOPSY;  Surgeon: [name], MD;  Location: [place] [facility];  Service: Endoscopy;;    BIOPSY  06/09/2021   Procedure: BIOPSY;  Surgeon: [name], MD;  Location: [facility];  Service: Gastroenterology;;   BIV ICD INSERTION CRT-D N/A 05/25/2016   Procedure: BiV ICD Insertion CRT-D;  Surgeon: [name], MD;  Location: [facility];  Service: Cardiovascular;  Laterality: N/A;   CARDIAC CATHETERIZATION N/A 03/02/2016   Procedure: Right/Left Heart Cath and Coronary Angiography;  Surgeon: Nelva Bush, MD;  Location: [place];  Service: Cardiovascular;  Laterality: N/A;   CARDIOVERSION N/A 07/17/2016   Procedure: Cardioversion;  Surgeon: [name], MD;  Location: [facility];  Service: Cardiovascular;  Laterality: N/A;   COLONOSCOPY WITH PROPOFOL N/A 06/28/2017   Procedure: COLONOSCOPY WITH PROPOFOL;  Surgeon: [name], MD;  Location: [facility];  Service: Endoscopy;  Laterality: N/A;   colonscopy  2009   ESOPHAGOGASTRODUODENOSCOPY N/A 12/01/2020   Procedure: ESOPHAGOGASTRODUODENOSCOPY (EGD);  Surgeon: [name], MD;  Location: [facility];  Service: Endoscopy;  Laterality: N/A;  IDA/guaiac positive stools   ESOPHAGOGASTRODUODENOSCOPY (EGD) WITH PROPOFOL N/A 12/16/2020   Procedure: ESOPHAGOGASTRODUODENOSCOPY (EGD) WITH PROPOFOL;  Surgeon: [name], MD;  [facility]: [facility];  Service: Endoscopy;  Laterality: N/A;   ESOPHAGOGASTRODUODENOSCOPY (EGD) WITH PROPOFOL N/A 06/09/2021   Procedure: ESOPHAGOGASTRODUODENOSCOPY (EGD)  WITH PROPOFOL;  Surgeon: [name], MD;  Location: [facility];  Service: Gastroenterology;  Laterality: N/A;   GOLD SEED IMPLANT N/A 12/24/2017   Procedure: GOLD SEED IMPLANT, TRANSERINEAL;  Surgeon: Festus Aloe, MD;  Location: [facility];  Service: Urology;  Laterality: N/A;   INSERT / REPLACE / REMOVE PACEMAKER     LEAD REVISION  10/10/2018   LEAD REVISION/REPAIR N/A 10/10/2018   Procedure: LEAD REVISION/REPAIR;  Surgeon: [name], MD;  Location: [place];  Service: Cardiovascular;   Laterality: N/A;   POLYPECTOMY  06/28/2017   Procedure: POLYPECTOMY;  Surgeon: [name], MD;  [facility];  Service: Endoscopy;;  ascending and descending colon polyp   PORTACATH PLACEMENT Right 12/23/2020   Procedure: INSERTION PORT-A-CATH;  Surgeon: [name], MD;  Location: [place];  Service: General;  Laterality: Right;   PROSTATE BIOPSY  02/20/2017   SPACE OAR INSTILLATION N/A 12/24/2017   Procedure: SPACE OAR INSTILLATION;  Surgeon: Festus Aloe, MD;  Location: [facility];  Service: Urology;  Laterality: N/A;   TOTAL KNEE ARTHROPLASTY Right 08/16/2017   Procedure: RIGHT TOTAL KNEE ARTHROPLASTY;  Surgeon: [name], MD;  Location: [facility] [place];  Service: Orthopedics;  Laterality: Right;   UPPER ESOPHAGEAL ENDOSCOPIC ULTRASOUND (EUS) N/A 12/16/2020   Procedure: UPPER ESOPHAGEAL ENDOSCOPIC ULTRASOUND (EUS);  Surgeon: [name], MD;  Location: [facility];  Service: Endoscopy;  Laterality: N/A;   Patient Active Problem List   Diagnosis Date Noted   Malnutrition of moderate degree 09/15/2021   DM (diabetes mellitus), type 2 ([place]) 09/14/2021   Essential hypertension 09/13/2021   Dyslipidemia 09/13/2021   DNR (do not resuscitate) 09/13/2021   Port-A-Cath in place 06/15/2021   Hypotension due to hypovolemia 04/04/2021   Nausea vomiting and diarrhea 04/04/2021   Thrombocytopenia ([place]) 04/04/2021   Leukocytosis 04/04/2021   Acute renal failure superimposed on stage 3a chronic kidney disease ([place]) 04/04/2021   Diarrhea 04/01/2021   Occult blood in stools 04/01/2021   Foot ulcer, right ([place]) 04/01/2021   Hypotension 04/01/2021   Dehydration 04/01/2021   Iron deficiency anemia due to chronic blood loss 12/12/2020   Gastric cancer ([place]) 12/08/2020   Symptomatic anemia 11/30/2020   Gastrointestinal hemorrhage    Corns and callosities 08/07/2019   Blood clotting disorder ([place]) 05/06/2019   Coagulation defect ([place]) 05/06/2019   Pacemaker lead malfunction 10/10/2018    Pacemaker complications 32/35/5732   Pacemaker lead failure, sequela 10/10/2018   S/P knee replacement 08/16/2017   S/P total knee arthroplasty 08/16/2017   Malignant neoplasm of prostate ([place]) 04/02/2017   ICD (implantable cardioverter-defibrillator) in place    Persistent atrial fibrillation ([place]) 05/25/2016   Atrial fibrillation ([place]) 05/25/2016   Coronary artery disease involving native coronary artery without angina pectoris 04/17/2016   Other secondary pulmonary hypertension ([place]) 04/17/2016   Acute on chronic combined systolic and diastolic CHF (congestive heart failure) (HCC)    NICM (nonischemic cardiomyopathy) ([place])    LBBB (left bundle branch block)     ONSET DATE: noted onset of increased symptoms and loss of function during chemo around February 2023 (REFERRAL Date: 09/15/21)  REFERRING DIAG: I50.9 (ICD-10-CM) - CHF (congestive heart failure)  THERAPY DIAG:  Other disturbances of skin sensation  Other lack of coordination  Muscle weakness (generalized)  Unsteadiness on feet  Rationale for Evaluation and Treatment Rehabilitation  SUBJECTIVE:   SUBJECTIVE STATEMENT: Pt reports walking this morning and doing repetitions with blue theraband. Pt accompanied by: self and significant other ([place])  PERTINENT HISTORY:  Had a recent bout of chemo for his gastric tumor, ICD, CAD, a-fib, gastric CA, GI bleed, CHF, hypotension, LVEF 20-25%,   PRECAUTIONS: Fall  WEIGHT BEARING RESTRICTIONS No  PAIN:  Are you having pain? Yes: NPRS scale: 5-6/10 Pain location: fingers Pain description: pins and needles Aggravating factors: [place] tasks Relieving factors: rest   PATIENT GOALS to become independent again  OBJECTIVE:   HAND DOMINANCE: Right  ADLs: Eating: decreased control of utensil, is currently using built up handles Grooming: Mod I UB Dressing: typically wears elastic waist and pull over shirts LB Dressing: wife assists with setup of items, pt requires  increased time, now wearing elastic waist pants and slip on shoes Toileting: Mod I, requires increased time for hygiene post toileting Bathing: Setup for bathing with wife applying wash mitts and adjusting water, pt able to wash body Tub Shower transfers: tub/shower transfer with supervision from spouse, has tub transfer bench and grab bars Equipment: Transfer tub bench, Grab bars, and bed side commode  FUNCTIONAL OUTCOME MEASURES: Physical performance test: PPT #2: 24.72*  UPPER EXTREMITY ROM     Active ROM Right eval Left eval  Shoulder flexion 130 126  Shoulder abduction    Shoulder adduction    Shoulder extension    Shoulder internal rotation    Shoulder external rotation    Elbow flexion    Elbow extension    Wrist flexion    Wrist extension    Wrist ulnar deviation    Wrist radial deviation    Wrist pronation    Wrist supination    (Blank rows = not tested)   UPPER EXTREMITY MMT:     MMT Right eval Left eval  Shoulder flexion 4+/5 4+/5  Shoulder abduction    Shoulder adduction    Shoulder extension    Shoulder internal rotation    Shoulder external rotation    Middle trapezius    Lower trapezius    Elbow flexion 4+/5 4+/5  Elbow extension 4+/5 4+/5  Wrist flexion    Wrist extension    Wrist ulnar deviation    Wrist radial deviation    Wrist pronation    Wrist supination    (Blank rows = not tested)   COORDINATION: Finger Nose Finger test: slow bilaterally, required demonstration for technique Box and Blocks:  Right 25 blocks, Left 13 blocks  SENSATION: Light touch: Impaired  Stereognosis: Impaired  Hot/Cold: Impaired   COGNITION: Overall cognitive status:  Pt required demonstration and multimodal cues throughout evaluation, question further cognitive impairments.  VISION: Subjective report: s/p chemo pt with increased watery eyes, now wearing glasses instead of contacts Baseline vision: Wears glasses all the time   TODAY'S TREATMENT:   Coordination: placing large grip pegs into resistive peg board with focus on grasp and manipulation of pegs in alternating UE.  Pt picking up pegs with gross grasp bilaterally, however able to rotate in finger tips with occasional assist from opposite hand to place with more precision into holes per pattern.  Pt attempting to place pegs into L hand with R hand 20% of time, however able to correct self without additional cues. Pt utilizing RUE as stabilizer on board when placing pegs with L hand.  Pt dropping pegs 25% of time with L hand and 10% with R hand.  Therapist providing intermittent cues for improved grasp to increase precision.  Pt then demonstrating isolated index finger usage on L when pushing pegs into pegboard.   GMC/FMC: Picking up small rings and reaching  across midline to place rings on cones.  Pt knocking over cones x1 due to decreased motor control and sensation impacting grasp when attempting to pick up ring from cone.  Pt demonstrating mild difficulty when picking up rings from stack due to decreased sensation and coordination of fingers. Visual compensation: therapist educating on and reiterating importance of visually attending to task for improved motor control of hand when picking up, placing, and removing pegs in peg board and when picking up rings from cones.   PATIENT EDUCATION: Education details: Provided handouts/HEP for strengthening and impaired sensation Person educated: Patient and Spouse Education method: Explanation, Demonstration, and Handouts Education comprehension: verbalized understanding   HOME EXERCISE PROGRAM: Coordination exercises - See pt instructions  Access Code: [email or web address] URL: [email or web address] Date: 10/05/2021 Prepared by: [facility]  Exercises - Putty Squeezes  - 3 x weekly - 1 sets - 10 reps - Rolling Putty on Table  - 3 x weekly - 1 sets - 10 reps - Thumb Opposition with Putty  - 3 x  weekly - 1 sets - 10 reps - "Duck mouth"  - 3 x weekly - 1 sets - 10 reps    GOALS: Goals reviewed with patient? Yes  SHORT TERM GOALS: Target date: 10/27/21  Pt will be independent with HEP for [facility] to increase coordination as needed for ADLs and IADLs. Baseline: Goal status: IN PROGRESS  2.  Pt will demonstrate improved UE functional use for ADLs as evidenced by increasing box/ blocks score by 3 blocks with LUE Baseline: Right 25 blocks, Left 13 blocks Goal status: IN PROGRESS  3.  Pt will demonstrate ability to retrieve a lightweight object with BUE at moderate height range to demonstrate increased UE strength as needed to complete ADLs and IADLs.  Baseline: R shoulder 130* and L shoulder 124* Goal status: IN PROGRESS    LONG TERM GOALS: Target date: 11/24/21  Pt will demonstrate improved UE functional use for ADLs as evidenced by increasing box/ blocks score by 6 blocks with LUE Baseline: Right 25 blocks, Left 13 blocks Baseline:  Goal status: IN PROGRESS  2.  Pt will demonstrate improved ease with feeding as evidenced by decreasing PPT#2 (self feeding) by 3 secs Baseline: PPT #2: 24.72 Goal status: IN PROGRESS  3.  Pt will verbalize understanding of strategies, safety consideration, and AE needs to ensure safety due to decreased sensation.  Baseline:  Goal status: IN PROGRESS  4.  Pt will verbalize improved ROM and motor control with ability to complete hygiene post toileting  Baseline:  Goal status: IN PROGRESS   ASSESSMENT:  CLINICAL IMPRESSION: Treatment session with focus on visual attention to UE functional grasp and coordination activities. Pt continues to demonstrate impaired coordination, sensation, and even mobility of digits. Pt demonstrating decreased sensation and awareness of positioning of fingers when picking up and manipulating large grip pegs and rings requiring mod cues to visually attend to digits and to increase focus on quality of grasp.  Pt  utilizing gross grasp, precision pinch, and isolated finger extension when picking up, placing, and manipulating rings and pegs.  Pt continues to demonstrate greater impairment L > R. Pt receptive to education about impaired sensation and safety strategies and use of vision to compensate for impaired sensation.  PERFORMANCE DEFICITS in functional skills including ADLs, IADLs, coordination, dexterity, proprioception, sensation, ROM, strength, pain, flexibility, FMC, GMC, mobility, balance, body mechanics, endurance, cardiopulmonary status limiting function, decreased knowledge of use of DME,  skin integrity, and UE functional use.  IMPAIRMENTS are limiting patient from ADLs and IADLs.   COMORBIDITIES may have co-morbidities  that affects occupational performance. Patient will benefit from skilled OT to address above impairments and improve overall function.  MODIFICATION OR ASSISTANCE TO COMPLETE EVALUATION: Min-Moderate modification of tasks or assist with assess necessary to complete an evaluation.  OT OCCUPATIONAL PROFILE AND HISTORY: Detailed assessment: Review of records and additional review of physical, cognitive, psychosocial history related to current functional performance.  CLINICAL DECISION MAKING: Moderate - several treatment options, min-mod task modification necessary  REHAB POTENTIAL: Good  EVALUATION COMPLEXITY: Moderate    PLAN: OT FREQUENCY: 1-2x/week 2x/week initially and may decrease to 1x/week pending progress  OT DURATION: 8 weeks  PLANNED INTERVENTIONS: self care/ADL training, therapeutic exercise, therapeutic activity, neuromuscular re-education, manual therapy, passive range of motion, balance training, functional mobility training, paraffin, fluidotherapy, moist heat, patient/family education, energy conservation, and DME and/or AE instructions  RECOMMENDED OTHER SERVICES: NA  CONSULTED AND AGREED WITH PLAN OF CARE: Patient and family member/caregiver  PLAN FOR  NEXT SESSION: review coordination exercises and add as appropriate, provide strengthening HEP/theraputty HEP. Engage in functional grasp   [place], [place], OTR/L 10/10/2021, 10:30 AM

## 2021-10-12 ENCOUNTER — Ambulatory Visit: Payer: Medicare HMO | Admitting: Occupational Therapy

## 2021-10-12 DIAGNOSIS — M6281 Muscle weakness (generalized): Secondary | ICD-10-CM

## 2021-10-12 DIAGNOSIS — R2681 Unsteadiness on feet: Secondary | ICD-10-CM | POA: Diagnosis not present

## 2021-10-12 DIAGNOSIS — R278 Other lack of coordination: Secondary | ICD-10-CM

## 2021-10-12 DIAGNOSIS — R208 Other disturbances of skin sensation: Secondary | ICD-10-CM

## 2021-10-12 NOTE — Therapy (Signed)
OUTPATIENT OCCUPATIONAL THERAPY Treatment Session Patient Name: Jon Williams MRN: [id number] DOB:08/24/1950, 71 y.o., male Today's Date: 10/12/2021  PCP: [name], MD REFERRING PROVIDER: Debbe Odea, MD    OT End of Session - 10/12/21 [phone]     Visit Number 5    Number of Visits 13    Date for OT Re-Evaluation 11/24/21    Authorization Type [employer]    OT Start Time 0935    OT Stop Time 1015    OT Time Calculation (min) 40 min                 Past Medical History:  Diagnosis Date   AICD (automatic cardioverter/defibrillator) present 05/25/2016   biv icd   Bunion, right    Chronic combined systolic and diastolic CHF (congestive heart failure) ([place])    Echo 1/18: Mild conc LVH, EF 15-20, severe diff HK, inf and inf-septal AK, Gr 3 DD, mild to mod MR, severe LAE, mod reduced RVSF, mod RAE, mild TR, PASP 50   Coronary artery disease involving native coronary artery without angina pectoris 04/17/2016   LHC 1/18: pLCx 30, mLCx 20, mRCA 40, dRCA 20, LVEDP 23, mean RA 8, PA 42/20, PCWP 17   Diabetes mellitus without complication (HCC)    DJD (degenerative joint disease)    Gastric cancer ([place])    History of atrial fibrillation    History of atrial flutter    History of cardiomegaly 06/07/2016   Noted on CXR   History of colon polyps 06/28/2017   Noted on colonoscopy   LBBB (left bundle branch block)    NICM (nonischemic cardiomyopathy) ([place])    Echo 1/18:  Mild conc LVH, EF 15-20, severe diff HK, inf and inf-septal AK, Gr 3 DD, mild to mod MR, severe LAE, mod reduced RVSF, mod RAE, mild TR, PASP 50   OA (osteoarthritis)    knee   Other secondary pulmonary hypertension ([place]) 04/17/2016   Prostate cancer ([place]) 2019   Sigmoid diverticulosis 06/28/2017   Noted on colonoscopy   Past Surgical History:  Procedure Laterality Date   BIOPSY  12/01/2020   Procedure: BIOPSY;  Surgeon: [name], MD;  Location: [facility];  Service: Endoscopy;;   BIOPSY   06/09/2021   Procedure: BIOPSY;  Surgeon: [name], MD;  Location: [facility];  Service: Gastroenterology;;   BIV ICD INSERTION CRT-D N/A 05/25/2016   Procedure: BiV ICD Insertion CRT-D;  Surgeon: [name], MD;  Location: [facility];  Service: Cardiovascular;  Laterality: N/A;   CARDIAC CATHETERIZATION N/A 03/02/2016   Procedure: Right/Left Heart Cath and Coronary Angiography;  Surgeon: Nelva Bush, MD;  Location: [facility];  Service: Cardiovascular;  Laterality: N/A;   CARDIOVERSION N/A 07/17/2016   Procedure: Cardioversion;  Surgeon: [name], MD;  Location: [facility];  Service: Cardiovascular;  Laterality: N/A;   COLONOSCOPY WITH PROPOFOL N/A 06/28/2017   Procedure: COLONOSCOPY WITH PROPOFOL;  Surgeon: [name], MD;  Location: [facility];  Service: Endoscopy;  Laterality: N/A;   colonscopy  2009   ESOPHAGOGASTRODUODENOSCOPY N/A 12/01/2020   Procedure: ESOPHAGOGASTRODUODENOSCOPY (EGD);  Surgeon: [name], MD;  Location: [facility];  Service: Endoscopy;  Laterality: N/A;  IDA/guaiac positive stools   ESOPHAGOGASTRODUODENOSCOPY (EGD) WITH PROPOFOL N/A 12/16/2020   Procedure: ESOPHAGOGASTRODUODENOSCOPY (EGD) WITH PROPOFOL;  Surgeon: [name], MD;  Location: [facility];  Service: Endoscopy;  Laterality: N/A;   ESOPHAGOGASTRODUODENOSCOPY (EGD) WITH PROPOFOL N/A 06/09/2021   Procedure: ESOPHAGOGASTRODUODENOSCOPY (EGD) WITH  PROPOFOL;  Surgeon: [name], MD;  Location: [facility];  Service: Gastroenterology;  Laterality: N/A;   GOLD SEED IMPLANT N/A 12/24/2017   Procedure: GOLD SEED IMPLANT, TRANSERINEAL;  Surgeon: Festus Aloe, MD;  Location: [facility];  Service: Urology;  Laterality: N/A;   INSERT / REPLACE / REMOVE PACEMAKER     LEAD REVISION  10/10/2018   LEAD REVISION/REPAIR N/A 10/10/2018   Procedure: LEAD REVISION/REPAIR;  Surgeon: [name], MD;  Location: [facility];  Service: Cardiovascular;  Laterality:  N/A;   POLYPECTOMY  06/28/2017   Procedure: POLYPECTOMY;  Surgeon: [name], MD;  Location: [facility];  Service: Endoscopy;;  ascending and descending colon polyp   PORTACATH PLACEMENT Right 12/23/2020   Procedure: INSERTION PORT-A-CATH;  Surgeon: [name], MD;  Location: [facility];  Service: General;  Laterality: Right;   PROSTATE BIOPSY  02/20/2017   SPACE OAR INSTILLATION N/A 12/24/2017   Procedure: SPACE OAR INSTILLATION;  Surgeon: Festus Aloe, MD;  Location: [facility];  Service: Urology;  Laterality: N/A;   TOTAL KNEE ARTHROPLASTY Right 08/16/2017   Procedure: RIGHT TOTAL KNEE ARTHROPLASTY;  Surgeon: [name], MD;  Location: [facility];  Service: Orthopedics;  Laterality: Right;   UPPER ESOPHAGEAL ENDOSCOPIC ULTRASOUND (EUS) N/A 12/16/2020   Procedure: UPPER ESOPHAGEAL ENDOSCOPIC ULTRASOUND (EUS);  Surgeon: [name], MD;  Location: [facility];  Service: Endoscopy;  Laterality: N/A;   Patient Active Problem List   Diagnosis Date Noted   Malnutrition of moderate degree 09/15/2021   DM (diabetes mellitus), type 2 ([place]) 09/14/2021   Essential hypertension 09/13/2021   Dyslipidemia 09/13/2021   DNR (do not resuscitate) 09/13/2021   Port-A-Cath in place 06/15/2021   Hypotension due to hypovolemia 04/04/2021   Nausea vomiting and diarrhea 04/04/2021   Thrombocytopenia ([place]) 04/04/2021   Leukocytosis 04/04/2021   Acute renal failure superimposed on stage 3a chronic kidney disease ([place]) 04/04/2021   Diarrhea 04/01/2021   Occult blood in stools 04/01/2021   Foot ulcer, right ([place]) 04/01/2021   Hypotension 04/01/2021   Dehydration 04/01/2021   Iron deficiency anemia due to chronic blood loss 12/12/2020   Gastric cancer ([place]) 12/08/2020   Symptomatic anemia 11/30/2020   Gastrointestinal hemorrhage    Corns and callosities 08/07/2019   Blood clotting disorder ([place]) 05/06/2019   Coagulation defect ([place]) 05/06/2019   Pacemaker lead malfunction 10/10/2018   Pacemaker  complications 36/64/4034   Pacemaker lead failure, sequela 10/10/2018   S/P knee replacement 08/16/2017   S/P total knee arthroplasty 08/16/2017   Malignant neoplasm of prostate ([place]) 04/02/2017   ICD (implantable cardioverter-defibrillator) in place    Persistent atrial fibrillation ([place]) 05/25/2016   Atrial fibrillation ([place]) 05/25/2016   Coronary artery disease involving native coronary artery without angina pectoris 04/17/2016   Other secondary pulmonary hypertension ([place]) 04/17/2016   Acute on chronic combined systolic and diastolic CHF (congestive heart failure) (HCC)    NICM (nonischemic cardiomyopathy) ([place])    LBBB (left bundle branch block)     ONSET DATE: noted onset of increased symptoms and loss of function during chemo around February 2023 (REFERRAL Date: 09/15/21)  REFERRING DIAG: I50.9 (ICD-10-CM) - CHF (congestive heart failure)  THERAPY DIAG:  Other disturbances of skin sensation  Other lack of coordination  Muscle weakness (generalized)  Unsteadiness on feet  Rationale for Evaluation and Treatment Rehabilitation  SUBJECTIVE:   SUBJECTIVE STATEMENT: Pt reports feeling like his hands are a little looser today. Pt accompanied by: self and significant other (Jon Williams)  PERTINENT HISTORY: Had  a recent bout of chemo for his gastric tumor, ICD, CAD, a-fib, gastric CA, GI bleed, CHF, hypotension, LVEF 20-25%,   PRECAUTIONS: Fall  WEIGHT BEARING RESTRICTIONS No  PAIN:  Are you having pain? Yes: NPRS scale: 5-6/10 Pain location: fingers Pain description: pins and needles Aggravating factors: [place] tasks Relieving factors: rest   PATIENT GOALS to become independent again  OBJECTIVE:   HAND DOMINANCE: Right  ADLs: Eating: decreased control of utensil, is currently using built up handles Grooming: Mod I UB Dressing: typically wears elastic waist and pull over shirts LB Dressing: wife assists with setup of items, pt requires increased time, now wearing  elastic waist pants and slip on shoes Toileting: Mod I, requires increased time for hygiene post toileting Bathing: Setup for bathing with wife applying wash mitts and adjusting water, pt able to wash body Tub Shower transfers: tub/shower transfer with supervision from spouse, has tub transfer bench and grab bars Equipment: Transfer tub bench, Grab bars, and bed side commode  FUNCTIONAL OUTCOME MEASURES: Physical performance test: PPT #2: 24.72*  UPPER EXTREMITY ROM     Active ROM Right eval Left eval  Shoulder flexion 130 126  Shoulder abduction    Shoulder adduction    Shoulder extension    Shoulder internal rotation    Shoulder external rotation    Elbow flexion    Elbow extension    Wrist flexion    Wrist extension    Wrist ulnar deviation    Wrist radial deviation    Wrist pronation    Wrist supination    (Blank rows = not tested)   UPPER EXTREMITY MMT:     MMT Right eval Left eval  Shoulder flexion 4+/5 4+/5  Shoulder abduction    Shoulder adduction    Shoulder extension    Shoulder internal rotation    Shoulder external rotation    Middle trapezius    Lower trapezius    Elbow flexion 4+/5 4+/5  Elbow extension 4+/5 4+/5  Wrist flexion    Wrist extension    Wrist ulnar deviation    Wrist radial deviation    Wrist pronation    Wrist supination    (Blank rows = not tested)   COORDINATION: Finger Nose Finger test: slow bilaterally, required demonstration for technique Box and Blocks:  Right 25 blocks, Left 13 blocks  SENSATION: Light touch: Impaired  Stereognosis: Impaired  Hot/Cold: Impaired   COGNITION: Overall cognitive status:  Pt required demonstration and multimodal cues throughout evaluation, question further cognitive impairments.  VISION: Subjective report: s/p chemo pt with increased watery eyes, now wearing glasses instead of contacts Baseline vision: Wears glasses all the  time --------------------------------------------------------------------------------------------------------------------------------------------------------   TODAY'S TREATMENT:  Hand Gripper: with RUE on level 2 - 10# with green spring. Pt picked up 1 inch blocks with gripper hand over hand on gripper to maintain marginally smaller width of gripper to facilitate increased/sustained grasp.   Completed with LUE on level as above with improved results with sustained grasp of LUE.  Completed 2nd attempt with pt demonstrating improved ability to correct grasp as needed, pt having to adjust hand placement after every 1-2 grasps with RUE.   GMC: stacking cups in 10 frame pyramid form to facilitate increased functional and sustained grasp on cups.  Pt demonstrating improved coordination and control when picking up and stacking cups with RUE.  Therapist challenged pt to [facility] requiring increased effort and motor control, but able to complete with only dropping 1 cup.  Pt demonstrating  increased challenge when completing with LUE, dropping 3 cups and demonstrating more gross grasp when picking up and manipulating cups.  Pt demonstrating decreased incorporation of index finger on R hand when grasping cups. Increased challenge to alternating hands with stacking to challenge alternating motor control, pt demonstrating improved motor control and grasp during alternating task with one drop with L hand.   PATIENT EDUCATION: Education details: Provided handouts/HEP for strengthening and impaired sensation Person educated: Patient and Spouse Education method: Explanation, Demonstration, and Handouts Education comprehension: verbalized understanding   HOME EXERCISE PROGRAM: Coordination exercises - See pt instructions  Access Code: [email or web address] URL: [email or web address] Date: 10/05/2021 Prepared by: [facility]  Exercises - Putty Squeezes  - 3 x weekly - 1  sets - 10 reps - Rolling Putty on Table  - 3 x weekly - 1 sets - 10 reps - Thumb Opposition with Putty  - 3 x weekly - 1 sets - 10 reps - "Duck mouth"  - 3 x weekly - 1 sets - 10 reps    GOALS: Goals reviewed with patient? Yes  SHORT TERM GOALS: Target date: 10/27/21  Pt will be independent with HEP for [facility] to increase coordination as needed for ADLs and IADLs. Baseline: Goal status: IN PROGRESS  2.  Pt will demonstrate improved UE functional use for ADLs as evidenced by increasing box/ blocks score by 3 blocks with LUE Baseline: Right 25 blocks, Left 13 blocks Goal status: IN PROGRESS  3.  Pt will demonstrate ability to retrieve a lightweight object with BUE at moderate height range to demonstrate increased UE strength as needed to complete ADLs and IADLs.  Baseline: R shoulder 130* and L shoulder 124* Goal status: IN PROGRESS    LONG TERM GOALS: Target date: 11/24/21  Pt will demonstrate improved UE functional use for ADLs as evidenced by increasing box/ blocks score by 6 blocks with LUE Baseline: Right 25 blocks, Left 13 blocks Baseline:  Goal status: IN PROGRESS  2.  Pt will demonstrate improved ease with feeding as evidenced by decreasing PPT#2 (self feeding) by 3 secs Baseline: PPT #2: 24.72 Goal status: IN PROGRESS  3.  Pt will verbalize understanding of strategies, safety consideration, and AE needs to ensure safety due to decreased sensation.  Baseline:  Goal status: IN PROGRESS  4.  Pt will verbalize improved ROM and motor control with ability to complete hygiene post toileting  Baseline:  Goal status: IN PROGRESS   ASSESSMENT:  CLINICAL IMPRESSION: Treatment session with focus on visual attention to UE during functional grasp and coordination activities. Pt continues to demonstrate impaired coordination, sensation, and even mobility of digits. Pt continues to require mod cues to visually attend to digits and to increase focus on quality of grasp.  Pt  continues to demonstrate greater impairment L > R.   PERFORMANCE DEFICITS in functional skills including ADLs, IADLs, coordination, dexterity, proprioception, sensation, ROM, strength, pain, flexibility, FMC, GMC, mobility, balance, body mechanics, endurance, cardiopulmonary status limiting function, decreased knowledge of use of DME, skin integrity, and UE functional use.  IMPAIRMENTS are limiting patient from ADLs and IADLs.   COMORBIDITIES may have co-morbidities  that affects occupational performance. Patient will benefit from skilled OT to address above impairments and improve overall function.  MODIFICATION OR ASSISTANCE TO COMPLETE EVALUATION: Min-Moderate modification of tasks or assist with assess necessary to complete an evaluation.  OT OCCUPATIONAL PROFILE AND HISTORY: Detailed assessment: Review of records and additional review of physical, cognitive,  psychosocial history related to current functional performance.  CLINICAL DECISION MAKING: Moderate - several treatment options, min-mod task modification necessary  REHAB POTENTIAL: Good  EVALUATION COMPLEXITY: Moderate    PLAN: OT FREQUENCY: 1-2x/week 2x/week initially and may decrease to 1x/week pending progress  OT DURATION: 8 weeks  PLANNED INTERVENTIONS: self care/ADL training, therapeutic exercise, therapeutic activity, neuromuscular re-education, manual therapy, passive range of motion, balance training, functional mobility training, paraffin, fluidotherapy, moist heat, patient/family education, energy conservation, and DME and/or AE instructions  RECOMMENDED OTHER SERVICES: NA  CONSULTED AND AGREED WITH PLAN OF CARE: Patient and family member/caregiver  PLAN FOR NEXT SESSION: review coordination exercises and add as appropriate, provide strengthening HEP/theraputty HEP. Engage in functional grasp   [place], Bray, OTR/L 10/12/2021, 9:39 AM

## 2021-10-17 ENCOUNTER — Ambulatory Visit: Payer: Medicare HMO | Attending: Internal Medicine | Admitting: Occupational Therapy

## 2021-10-17 DIAGNOSIS — R208 Other disturbances of skin sensation: Secondary | ICD-10-CM | POA: Diagnosis not present

## 2021-10-17 DIAGNOSIS — R2681 Unsteadiness on feet: Secondary | ICD-10-CM | POA: Insufficient documentation

## 2021-10-17 DIAGNOSIS — M6281 Muscle weakness (generalized): Secondary | ICD-10-CM | POA: Diagnosis not present

## 2021-10-17 DIAGNOSIS — R278 Other lack of coordination: Secondary | ICD-10-CM | POA: Insufficient documentation

## 2021-10-17 NOTE — Therapy (Signed)
OUTPATIENT OCCUPATIONAL THERAPY Treatment Session Patient Name: Jon Williams MRN: [id number] DOB:Mar 30, 1950, 71 y.o., male Today's Date: 10/17/2021  PCP: [name], MD REFERRING PROVIDER: Debbe Odea, MD    OT End of Session - 10/17/21 [phone]     Visit Number 6    Number of Visits 13    Date for OT Re-Evaluation 11/24/21    Authorization Type [employer]    OT Start Time 8811    OT Stop Time 0955    OT Time Calculation (min) 43 min                  Past Medical History:  Diagnosis Date   AICD (automatic cardioverter/defibrillator) present 05/25/2016   biv icd   Bunion, right    Chronic combined systolic and diastolic CHF (congestive heart failure) ([place])    Echo 1/18: Mild conc LVH, EF 15-20, severe diff HK, inf and inf-septal AK, Gr 3 DD, mild to mod MR, severe LAE, mod reduced RVSF, mod RAE, mild TR, PASP 50   Coronary artery disease involving native coronary artery without angina pectoris 04/17/2016   LHC 1/18: pLCx 30, mLCx 20, mRCA 62, dRCA 20, LVEDP 23, mean RA 8, PA 42/20, PCWP 17   Diabetes mellitus without complication (HCC)    DJD (degenerative joint disease)    Gastric cancer ([place])    History of atrial fibrillation    History of atrial flutter    History of cardiomegaly 06/07/2016   Noted on CXR   History of colon polyps 06/28/2017   Noted on colonoscopy   LBBB (left bundle branch block)    NICM (nonischemic cardiomyopathy) ([place])    Echo 1/18:  Mild conc LVH, EF 15-20, severe diff HK, inf and inf-septal AK, Gr 3 DD, mild to mod MR, severe LAE, mod reduced RVSF, mod RAE, mild TR, PASP 50   OA (osteoarthritis)    knee   Other secondary pulmonary hypertension ([place]) 04/17/2016   Prostate cancer ([place]) 2019   Sigmoid diverticulosis 06/28/2017   Noted on colonoscopy   Past Surgical History:  Procedure Laterality Date   BIOPSY  12/01/2020   Procedure: BIOPSY;  Surgeon: [name], MD;  Location: [place] [facility];  Service: Endoscopy;;   BIOPSY   06/09/2021   Procedure: BIOPSY;  Surgeon: [name], MD;  Location: [facility];  Service: Gastroenterology;;   BIV ICD INSERTION CRT-D N/A 05/25/2016   Procedure: BiV ICD Insertion CRT-D;  Surgeon: [name], MD;  Location: [facility];  Service: Cardiovascular;  Laterality: N/A;   CARDIAC CATHETERIZATION N/A 03/02/2016   Procedure: Right/Left Heart Cath and Coronary Angiography;  Surgeon: Nelva Bush, MD;  Location: [facility];  Service: Cardiovascular;  Laterality: N/A;   CARDIOVERSION N/A 07/17/2016   Procedure: Cardioversion;  Surgeon: [name], MD;  Location: [facility];  Service: Cardiovascular;  Laterality: N/A;   COLONOSCOPY WITH PROPOFOL N/A 06/28/2017   Procedure: COLONOSCOPY WITH PROPOFOL;  Surgeon: [name], MD;  [facility]: [facility];  Service: Endoscopy;  Laterality: N/A;   colonscopy  2009   ESOPHAGOGASTRODUODENOSCOPY N/A 12/01/2020   Procedure: ESOPHAGOGASTRODUODENOSCOPY (EGD);  Surgeon: [name], MD;  Location: [facility];  Service: Endoscopy;  Laterality: N/A;  IDA/guaiac positive stools   ESOPHAGOGASTRODUODENOSCOPY (EGD) WITH PROPOFOL N/A 12/16/2020   Procedure: ESOPHAGOGASTRODUODENOSCOPY (EGD) WITH PROPOFOL;  Surgeon: [name], MD;  [facility];  Service: Endoscopy;  Laterality: N/A;   ESOPHAGOGASTRODUODENOSCOPY (EGD) WITH PROPOFOL N/A 06/09/2021   Procedure: ESOPHAGOGASTRODUODENOSCOPY (EGD)  WITH PROPOFOL;  Surgeon: [name], MD;  Location: [facility];  Service: Gastroenterology;  Laterality: N/A;   GOLD SEED IMPLANT N/A 12/24/2017   Procedure: GOLD SEED IMPLANT, TRANSERINEAL;  Surgeon: Festus Aloe, MD;  [facility];  Service: Urology;  Laterality: N/A;   INSERT / REPLACE / REMOVE PACEMAKER     LEAD REVISION  10/10/2018   LEAD REVISION/REPAIR N/A 10/10/2018   Procedure: LEAD REVISION/REPAIR;  Surgeon: [name], MD;  Location: [facility];  Service: Cardiovascular;  Laterality:  N/A;   POLYPECTOMY  06/28/2017   Procedure: POLYPECTOMY;  Surgeon: [name], MD;  [facility];  Service: Endoscopy;;  ascending and descending colon polyp   PORTACATH PLACEMENT Right 12/23/2020   Procedure: INSERTION PORT-A-CATH;  Surgeon: [name], MD;  Location: [facility];  Service: General;  Laterality: Right;   PROSTATE BIOPSY  02/20/2017   SPACE OAR INSTILLATION N/A 12/24/2017   Procedure: SPACE OAR INSTILLATION;  Surgeon: Festus Aloe, MD;  [facility];  Service: Urology;  Laterality: N/A;   TOTAL KNEE ARTHROPLASTY Right 08/16/2017   Procedure: RIGHT TOTAL KNEE ARTHROPLASTY;  Surgeon: [name], MD;  Location: [place];  Service: Orthopedics;  Laterality: Right;   UPPER ESOPHAGEAL ENDOSCOPIC ULTRASOUND (EUS) N/A 12/16/2020   Procedure: UPPER ESOPHAGEAL ENDOSCOPIC ULTRASOUND (EUS);  Surgeon: [name], MD;  Location: [facility];  Service: Endoscopy;  Laterality: N/A;   Patient Active Problem List   Diagnosis Date Noted   Malnutrition of moderate degree 09/15/2021   DM (diabetes mellitus), type 2 ([place]) 09/14/2021   Essential hypertension 09/13/2021   Dyslipidemia 09/13/2021   DNR (do not resuscitate) 09/13/2021   Port-A-Cath in place 06/15/2021   Hypotension due to hypovolemia 04/04/2021   Nausea vomiting and diarrhea 04/04/2021   Thrombocytopenia ([place]) 04/04/2021   Leukocytosis 04/04/2021   Acute renal failure superimposed on stage 3a chronic kidney disease ([place]) 04/04/2021   Diarrhea 04/01/2021   Occult blood in stools 04/01/2021   Foot ulcer, right ([place]) 04/01/2021   Hypotension 04/01/2021   Dehydration 04/01/2021   Iron deficiency anemia due to chronic blood loss 12/12/2020   Gastric cancer ([place]) 12/08/2020   Symptomatic anemia 11/30/2020   Gastrointestinal hemorrhage    Corns and callosities 08/07/2019   Blood clotting disorder ([place]) 05/06/2019   Coagulation defect ([place]) 05/06/2019   Pacemaker lead malfunction 10/10/2018   Pacemaker  complications 44/81/8563   Pacemaker lead failure, sequela 10/10/2018   S/P knee replacement 08/16/2017   S/P total knee arthroplasty 08/16/2017   Malignant neoplasm of prostate ([place]) 04/02/2017   ICD (implantable cardioverter-defibrillator) in place    Persistent atrial fibrillation ([place]) 05/25/2016   Atrial fibrillation ([place]) 05/25/2016   Coronary artery disease involving native coronary artery without angina pectoris 04/17/2016   Other secondary pulmonary hypertension ([place]) 04/17/2016   Acute on chronic combined systolic and diastolic CHF (congestive heart failure) (HCC)    NICM (nonischemic cardiomyopathy) ([place])    LBBB (left bundle branch block)     ONSET DATE: noted onset of increased symptoms and loss of function during chemo around February 2023 (REFERRAL Date: 09/15/21)  REFERRING DIAG: I50.9 (ICD-10-CM) - CHF (congestive heart failure)  THERAPY DIAG:  Other lack of coordination  Muscle weakness (generalized)  Other disturbances of skin sensation  Unsteadiness on feet  Rationale for Evaluation and Treatment Rehabilitation  SUBJECTIVE:   SUBJECTIVE STATEMENT: Pt reports having a good visit with former players over the weekend. Pt accompanied by: self and significant other ([place])  PERTINENT  HISTORY: Had a recent bout of chemo for his gastric tumor, ICD, CAD, a-fib, gastric CA, GI bleed, CHF, hypotension, LVEF 20-25%,   PRECAUTIONS: Fall  WEIGHT BEARING RESTRICTIONS No  PAIN:  Are you having pain? Yes: NPRS scale: 5-6/10 Pain location: fingers Pain description: stiffness, pins and needles Aggravating factors: [place] tasks Relieving factors: rest   PATIENT GOALS to become independent again  OBJECTIVE:   HAND DOMINANCE: Right  ADLs: Eating: decreased control of utensil, is currently using built up handles Grooming: Mod I UB Dressing: typically wears elastic waist and pull over shirts LB Dressing: wife assists with setup of items, pt requires increased  time, now wearing elastic waist pants and slip on shoes Toileting: Mod I, requires increased time for hygiene post toileting Bathing: Setup for bathing with wife applying wash mitts and adjusting water, pt able to wash body Tub Shower transfers: tub/shower transfer with supervision from spouse, has tub transfer bench and grab bars Equipment: Transfer tub bench, Grab bars, and bed side commode  FUNCTIONAL OUTCOME MEASURES: Physical performance test: PPT #2: 24.72*  UPPER EXTREMITY ROM     Active ROM Right eval Left eval  Shoulder flexion 130 126  Shoulder abduction    Shoulder adduction    Shoulder extension    Shoulder internal rotation    Shoulder external rotation    Elbow flexion    Elbow extension    Wrist flexion    Wrist extension    Wrist ulnar deviation    Wrist radial deviation    Wrist pronation    Wrist supination    (Blank rows = not tested)   UPPER EXTREMITY MMT:     MMT Right eval Left eval  Shoulder flexion 4+/5 4+/5  Shoulder abduction    Shoulder adduction    Shoulder extension    Shoulder internal rotation    Shoulder external rotation    Middle trapezius    Lower trapezius    Elbow flexion 4+/5 4+/5  Elbow extension 4+/5 4+/5  Wrist flexion    Wrist extension    Wrist ulnar deviation    Wrist radial deviation    Wrist pronation    Wrist supination    (Blank rows = not tested)   COORDINATION: Finger Nose Finger test: slow bilaterally, required demonstration for technique Box and Blocks:  Right 25 blocks, Left 13 blocks 10/17/21 - Right: 25 blocks, Left: 18 blocks  SENSATION: Light touch: Impaired  Stereognosis: Impaired  Hot/Cold: Impaired   COGNITION: Overall cognitive status:  Pt required demonstration and multimodal cues throughout evaluation, question further cognitive impairments.  VISION: Subjective report: s/p chemo pt with increased watery eyes, now wearing glasses instead of contacts Baseline vision: Wears glasses all the  time --------------------------------------------------------------------------------------------------------------------------------------------------------   TODAY'S TREATMENT:  Coordination: completed box and blocks assessment (see above for measurements).  Noted improvements in LUE grasp and coordination, however still demonstrating decreased grasp due to impaired sensation impacting coordination. Functional reach: engaged in reaching outside BOS and across midline to pick up rings and place on cones.  Pt demonstrating difficulty with coordination when picking up rings, especially when crossing midline.  Pt continues to demonstrate decreased full extension of index finger on L and decreased integration of index finger on R despite full flexion/extension.  Progressed to placing cups into cabinet to increase focus on shoulder flexion and grasp.  Pt demonstrating ability to grasp items from cabinet at ~90* shoulder flexion with good shoulder mobility, however requiring increased time and effort to grasp cones.  Pt challenged to retrieve cones and place them outside BOS first with LUE and then with RUE. Therapist providing assistance with LUE by separating cones to allow for increased focus on grasp to retreive cones. Educated on functional carryover with IADLs.   PATIENT EDUCATION: Education details: Educated on functional carryover of grasp and reaching tasks into home making/IADLs Person educated: Patient and Spouse Education method: Customer service manager Education comprehension: verbalized understanding   HOME EXERCISE PROGRAM: Coordination exercises - See pt instructions  Access Code: [email or web address] URL: [email or web address] Date: 10/05/2021 Prepared by: [facility]  Exercises - Putty Squeezes  - 3 x weekly - 1 sets - 10 reps - Rolling Putty on Table  - 3 x weekly - 1 sets - 10 reps - Thumb Opposition with Putty  - 3 x weekly - 1  sets - 10 reps - "Duck mouth"  - 3 x weekly - 1 sets - 10 reps    GOALS: Goals reviewed with patient? Yes  SHORT TERM GOALS: Target date: 10/27/21  Pt will be independent with HEP for [facility] to increase coordination as needed for ADLs and IADLs. Baseline: Goal status: IN PROGRESS  2.  Pt will demonstrate improved UE functional use for ADLs as evidenced by increasing box/ blocks score by 3 blocks with LUE Baseline: Right 25 blocks, Left 13 blocks Goal status: MET - 18 blocks on 10/17/21  3.  Pt will demonstrate ability to retrieve a lightweight object with BUE at moderate height range to demonstrate increased UE strength as needed to complete ADLs and IADLs.  Baseline: R shoulder 130* and L shoulder 124* Goal status: IN PROGRESS    LONG TERM GOALS: Target date: 11/24/21  Pt will demonstrate improved UE functional use for ADLs as evidenced by increasing box/ blocks score by 6 blocks with LUE Baseline: Right 25 blocks, Left 13 blocks Goal status: IN PROGRESS  2.  Pt will demonstrate improved ease with feeding as evidenced by decreasing PPT#2 (self feeding) by 3 secs Baseline: PPT #2: 24.72 Goal status: IN PROGRESS  3.  Pt will verbalize understanding of strategies, safety consideration, and AE needs to ensure safety due to decreased sensation.  Baseline:  Goal status: IN PROGRESS  4.  Pt will verbalize improved ROM and motor control with ability to complete hygiene post toileting  Baseline:  Goal status: IN PROGRESS   ASSESSMENT:  CLINICAL IMPRESSION: Treatment session with focus on visual attention to UE during functional grasp, coordination activities, and functional reach. Pt continues to demonstrate impaired coordination, sensation, and even mobility of digits - especially index finger on LUE. Pt demonstrating moderate improvements in [facility] tasks when seated, however expresses and demonstrates increased challenge when incorporating standing into function. Pt continues to  demonstrate greater impairment L > R.   PERFORMANCE DEFICITS in functional skills including ADLs, IADLs, coordination, dexterity, proprioception, sensation, ROM, strength, pain, flexibility, FMC, GMC, mobility, balance, body mechanics, endurance, cardiopulmonary status limiting function, decreased knowledge of use of DME, skin integrity, and UE functional use.  IMPAIRMENTS are limiting patient from ADLs and IADLs.   COMORBIDITIES may have co-morbidities  that affects occupational performance. Patient will benefit from skilled OT to address above impairments and improve overall function.  MODIFICATION OR ASSISTANCE TO COMPLETE EVALUATION: Min-Moderate modification of tasks or assist with assess necessary to complete an evaluation.  OT OCCUPATIONAL PROFILE AND HISTORY: Detailed assessment: Review of records and additional review of physical, cognitive, psychosocial history related to current functional performance.  CLINICAL DECISION MAKING: Moderate - several treatment options, min-mod task modification necessary  REHAB POTENTIAL: Good  EVALUATION COMPLEXITY: Moderate    PLAN: OT FREQUENCY: 1-2x/week 2x/week initially and may decrease to 1x/week pending progress  OT DURATION: 8 weeks  PLANNED INTERVENTIONS: self care/ADL training, therapeutic exercise, therapeutic activity, neuromuscular re-education, manual therapy, passive range of motion, balance training, functional mobility training, paraffin, fluidotherapy, moist heat, patient/family education, energy conservation, and DME and/or AE instructions  RECOMMENDED OTHER SERVICES: NA  CONSULTED AND AGREED WITH PLAN OF CARE: Patient and family member/caregiver  PLAN FOR NEXT SESSION: review coordination exercises and add as appropriate, review strengthening HEP/theraputty HEP. Engage in functional grasp and increasing reach.   Simonne Come, OTR/L 10/17/2021, 9:25 AM

## 2021-10-19 ENCOUNTER — Ambulatory Visit: Payer: Medicare HMO | Admitting: Occupational Therapy

## 2021-10-19 ENCOUNTER — Other Ambulatory Visit: Payer: Self-pay | Admitting: Hematology

## 2021-10-19 DIAGNOSIS — M6281 Muscle weakness (generalized): Secondary | ICD-10-CM

## 2021-10-19 DIAGNOSIS — R208 Other disturbances of skin sensation: Secondary | ICD-10-CM

## 2021-10-19 DIAGNOSIS — R278 Other lack of coordination: Secondary | ICD-10-CM

## 2021-10-19 DIAGNOSIS — R2681 Unsteadiness on feet: Secondary | ICD-10-CM | POA: Diagnosis not present

## 2021-10-19 NOTE — Therapy (Signed)
OUTPATIENT OCCUPATIONAL THERAPY Treatment Session Patient Name: Jon Williams MRN: [id number] DOB:[birth year], 71 y.o., male Today's Date: 10/19/2021  PCP: [name], MD REFERRING PROVIDER: Debbe Odea, MD    OT End of Session - 10/19/21 [id number]     Visit Number 7    Number of Visits 13    Date for OT Re-Evaluation 11/24/21    Authorization Type [employer]    OT Start Time 0849    OT Stop Time 0932    OT Time Calculation (min) 43 min                   Past Medical History:  Diagnosis Date   AICD (automatic cardioverter/defibrillator) present 05/25/2016   biv icd   Bunion, right    Chronic combined systolic and diastolic CHF (congestive heart failure) ([place])    Echo 1/18: Mild conc LVH, EF 15-20, severe diff HK, inf and inf-septal AK, Gr 3 DD, mild to mod MR, severe LAE, mod reduced RVSF, mod RAE, mild TR, PASP 50   Coronary artery disease involving native coronary artery without angina pectoris 04/17/2016   LHC 1/18: pLCx 30, mLCx 20, mRCA 47, dRCA 20, LVEDP 23, mean RA 8, PA 42/20, PCWP 17   Diabetes mellitus without complication (HCC)    DJD (degenerative joint disease)    Gastric cancer ([place])    History of atrial fibrillation    History of atrial flutter    History of cardiomegaly 06/07/2016   Noted on CXR   History of colon polyps 06/28/2017   Noted on colonoscopy   LBBB (left bundle branch block)    NICM (nonischemic cardiomyopathy) ([place])    Echo 1/18:  Mild conc LVH, EF 15-20, severe diff HK, inf and inf-septal AK, Gr 3 DD, mild to mod MR, severe LAE, mod reduced RVSF, mod RAE, mild TR, PASP 50   OA (osteoarthritis)    knee   Other secondary pulmonary hypertension ([place]) 04/17/2016   Prostate cancer ([place]) 2019   Sigmoid diverticulosis 06/28/2017   Noted on colonoscopy   Past Surgical History:  Procedure Laterality Date   BIOPSY  12/01/2020   Procedure: BIOPSY;  Surgeon: [name], MD;  Location: [facility];  Service: Endoscopy;;    BIOPSY  06/09/2021   Procedure: BIOPSY;  Surgeon: [name], MD;  Location: [facility];  Service: Gastroenterology;;   BIV ICD INSERTION CRT-D N/A 05/25/2016   Procedure: BiV ICD Insertion CRT-D;  Surgeon: [name], MD;  Location: [facility];  Service: Cardiovascular;  Laterality: N/A;   CARDIAC CATHETERIZATION N/A 03/02/2016   Procedure: Right/Left Heart Cath and Coronary Angiography;  Surgeon: Nelva Bush, MD;  Location: [facility];  Service: Cardiovascular;  Laterality: N/A;   CARDIOVERSION N/A 07/17/2016   Procedure: Cardioversion;  Surgeon: [name], MD;  Location: [facility];  Service: Cardiovascular;  Laterality: N/A;   COLONOSCOPY WITH PROPOFOL N/A 06/28/2017   Procedure: COLONOSCOPY WITH PROPOFOL;  Surgeon: [name], MD;  Location: [facility];  Service: Endoscopy;  Laterality: N/A;   colonscopy  2009   ESOPHAGOGASTRODUODENOSCOPY N/A 12/01/2020   Procedure: ESOPHAGOGASTRODUODENOSCOPY (EGD);  Surgeon: [name], MD;  Location: [facility];  Service: Endoscopy;  Laterality: N/A;  IDA/guaiac positive stools   ESOPHAGOGASTRODUODENOSCOPY (EGD) WITH PROPOFOL N/A 12/16/2020   Procedure: ESOPHAGOGASTRODUODENOSCOPY (EGD) WITH PROPOFOL;  Surgeon: [name], MD;  Location: [facility];  Service: Endoscopy;  Laterality: N/A;   ESOPHAGOGASTRODUODENOSCOPY (EGD) WITH PROPOFOL N/A 06/09/2021   Procedure: ESOPHAGOGASTRODUODENOSCOPY (  EGD) WITH PROPOFOL;  Surgeon: [name], MD;  Location: [facility];  Service: Gastroenterology;  Laterality: N/A;   GOLD SEED IMPLANT N/A 12/24/2017   Procedure: GOLD SEED IMPLANT, TRANSERINEAL;  Surgeon: Festus Aloe, MD;  Location: [facility];  Service: Urology;  Laterality: N/A;   INSERT / REPLACE / REMOVE PACEMAKER     LEAD REVISION  10/10/2018   LEAD REVISION/REPAIR N/A 10/10/2018   Procedure: LEAD REVISION/REPAIR;  Surgeon: [name], MD;  Location: [facility];  Service: Cardiovascular;   Laterality: N/A;   POLYPECTOMY  06/28/2017   Procedure: POLYPECTOMY;  Surgeon: [name], MD;  [facility];  Service: Endoscopy;;  ascending and descending colon polyp   PORTACATH PLACEMENT Right 12/23/2020   Procedure: INSERTION PORT-A-CATH;  Surgeon: [name], MD;  Location: [facility];  Service: General;  Laterality: Right;   PROSTATE BIOPSY  02/20/2017   SPACE OAR INSTILLATION N/A 12/24/2017   Procedure: SPACE OAR INSTILLATION;  Surgeon: Festus Aloe, MD;  Location: [facility];  Service: Urology;  Laterality: N/A;   TOTAL KNEE ARTHROPLASTY Right 08/16/2017   Procedure: RIGHT TOTAL KNEE ARTHROPLASTY;  Surgeon: [name], MD;  Location: [facility];  Service: Orthopedics;  Laterality: Right;   UPPER ESOPHAGEAL ENDOSCOPIC ULTRASOUND (EUS) N/A 12/16/2020   Procedure: UPPER ESOPHAGEAL ENDOSCOPIC ULTRASOUND (EUS);  Surgeon: [name], MD;  Location: [facility];  Service: Endoscopy;  Laterality: N/A;   Patient Active Problem List   Diagnosis Date Noted   Malnutrition of moderate degree 09/15/2021   DM (diabetes mellitus), type 2 ([place]) 09/14/2021   Essential hypertension 09/13/2021   Dyslipidemia 09/13/2021   DNR (do not resuscitate) 09/13/2021   Port-A-Cath in place 06/15/2021   Hypotension due to hypovolemia 04/04/2021   Nausea vomiting and diarrhea 04/04/2021   Thrombocytopenia ([place]) 04/04/2021   Leukocytosis 04/04/2021   Acute renal failure superimposed on stage 3a chronic kidney disease ([place]) 04/04/2021   Diarrhea 04/01/2021   Occult blood in stools 04/01/2021   Foot ulcer, right ([place]) 04/01/2021   Hypotension 04/01/2021   Dehydration 04/01/2021   Iron deficiency anemia due to chronic blood loss 12/12/2020   Gastric cancer ([place]) 12/08/2020   Symptomatic anemia 11/30/2020   Gastrointestinal hemorrhage    Corns and callosities 08/07/2019   Blood clotting disorder ([place]) 05/06/2019   Coagulation defect ([place]) 05/06/2019   Pacemaker lead malfunction 10/10/2018    Pacemaker complications 95/28/4132   Pacemaker lead failure, sequela 10/10/2018   S/P knee replacement 08/16/2017   S/P total knee arthroplasty 08/16/2017   Malignant neoplasm of prostate ([place]) 04/02/2017   ICD (implantable cardioverter-defibrillator) in place    Persistent atrial fibrillation ([place]) 05/25/2016   Atrial fibrillation ([place]) 05/25/2016   Coronary artery disease involving native coronary artery without angina pectoris 04/17/2016   Other secondary pulmonary hypertension ([place]) 04/17/2016   Acute on chronic combined systolic and diastolic CHF (congestive heart failure) (HCC)    NICM (nonischemic cardiomyopathy) ([place])    LBBB (left bundle branch block)     ONSET DATE: noted onset of increased symptoms and loss of function during chemo around February 2023 (REFERRAL Date: 09/15/21)  REFERRING DIAG: I50.9 (ICD-10-CM) - CHF (congestive heart failure)  THERAPY DIAG:  Other lack of coordination  Muscle weakness (generalized)  Other disturbances of skin sensation  Unsteadiness on feet  Rationale for Evaluation and Treatment Rehabilitation  SUBJECTIVE:   SUBJECTIVE STATEMENT: Pt reports mobility and dexterity feels better today. Pt accompanied by: self and significant other ([place])  PERTINENT HISTORY: Had a  recent bout of chemo for his gastric tumor, ICD, CAD, a-fib, gastric CA, GI bleed, CHF, hypotension, LVEF 20-25%,   PRECAUTIONS: Fall  WEIGHT BEARING RESTRICTIONS No  PAIN:  Are you having pain? Yes: NPRS scale: 4/10 Pain location: fingers Pain description: stiffness, pins and needles Aggravating factors: [place] tasks Relieving factors: rest   PATIENT GOALS to become independent again  OBJECTIVE:   HAND DOMINANCE: Right  ADLs: Eating: decreased control of utensil, is currently using built up handles Grooming: Mod I UB Dressing: typically wears elastic waist and pull over shirts LB Dressing: wife assists with setup of items, pt requires increased time, now  wearing elastic waist pants and slip on shoes Toileting: Mod I, requires increased time for hygiene post toileting Bathing: Setup for bathing with wife applying wash mitts and adjusting water, pt able to wash body Tub Shower transfers: tub/shower transfer with supervision from spouse, has tub transfer bench and grab bars Equipment: Transfer tub bench, Grab bars, and bed side commode  FUNCTIONAL OUTCOME MEASURES: Physical performance test: PPT #2: 24.72*  UPPER EXTREMITY ROM     Active ROM Right eval Left eval  Shoulder flexion 130 126  Shoulder abduction    Shoulder adduction    Shoulder extension    Shoulder internal rotation    Shoulder external rotation    Elbow flexion    Elbow extension    Wrist flexion    Wrist extension    Wrist ulnar deviation    Wrist radial deviation    Wrist pronation    Wrist supination    (Blank rows = not tested)   UPPER EXTREMITY MMT:     MMT Right eval Left eval  Shoulder flexion 4+/5 4+/5  Shoulder abduction    Shoulder adduction    Shoulder extension    Shoulder internal rotation    Shoulder external rotation    Middle trapezius    Lower trapezius    Elbow flexion 4+/5 4+/5  Elbow extension 4+/5 4+/5  Wrist flexion    Wrist extension    Wrist ulnar deviation    Wrist radial deviation    Wrist pronation    Wrist supination    (Blank rows = not tested)   COORDINATION: Finger Nose Finger test: slow bilaterally, required demonstration for technique Box and Blocks:  Right 25 blocks, Left 13 blocks 10/17/21 - Right: 25 blocks, Left: 18 blocks  SENSATION: Light touch: Impaired  Stereognosis: Impaired  Hot/Cold: Impaired   COGNITION: Overall cognitive status:  Pt required demonstration and multimodal cues throughout evaluation, question further cognitive impairments.  VISION: Subjective report: s/p chemo pt with increased watery eyes, now wearing glasses instead of contacts Baseline vision: Wears glasses all the  time --------------------------------------------------------------------------------------------------------------------------------------------------------   TODAY'S TREATMENT:  Coordination: large grip peg pattern replication while alternating UE.  Pt requiring intermittent assistance from opposite hand to facilitate improved hand placement/grip on pegs - especially in L hand.  Therapist challenged pt to complete rotation in hand with pt completing with improved thumb and index opposition, however still dropping >50% of time with LUE and requiring assistance from R hand to secure peg in peg board.  Pt ultimately transitioning to picking up pegs with L hand and placing them in peg board with R as pt continues to drop pegs when attempting to manipulate in L hand. Coordination: removing pegs from peg board with increased challenge to picking up and sustaining grasp on one peg while picking up 2nd peg.  Pt demonstrating difficulty with in-hand manipulation  and translation of pegs. FMC/GMC: picking up colored jacks and placing them in corresponding colored cones to challenge sustained grasp during functional reach. Pt demonstrating improved success with this task, compared to previous tasks due to decrease need for in-hand manipulation.   PATIENT EDUCATION: Education details: Educated on functional carryover of grasp and reaching tasks into home making/IADLs Person educated: Patient and Spouse Education method: Customer service manager Education comprehension: verbalized understanding   HOME EXERCISE PROGRAM: Coordination exercises - See pt instructions  Access Code: [email or web address] URL: [email or web address] Date: 10/05/2021 Prepared by: [facility]  Exercises - Putty Squeezes  - 3 x weekly - 1 sets - 10 reps - Rolling Putty on Table  - 3 x weekly - 1 sets - 10 reps - Thumb Opposition with Putty  - 3 x weekly - 1 sets - 10 reps - "Duck  mouth"  - 3 x weekly - 1 sets - 10 reps    GOALS: Goals reviewed with patient? Yes  SHORT TERM GOALS: Target date: 10/27/21  Pt will be independent with HEP for [facility] to increase coordination as needed for ADLs and IADLs. Baseline: Goal status: IN PROGRESS  2.  Pt will demonstrate improved UE functional use for ADLs as evidenced by increasing box/ blocks score by 3 blocks with LUE Baseline: Right 25 blocks, Left 13 blocks Goal status: MET - 18 blocks on 10/17/21  3.  Pt will demonstrate ability to retrieve a lightweight object with BUE at moderate height range to demonstrate increased UE strength as needed to complete ADLs and IADLs.  Baseline: R shoulder 130* and L shoulder 124* Goal status: IN PROGRESS    LONG TERM GOALS: Target date: 11/24/21  Pt will demonstrate improved UE functional use for ADLs as evidenced by increasing box/ blocks score by 6 blocks with LUE Baseline: Right 25 blocks, Left 13 blocks Goal status: IN PROGRESS  2.  Pt will demonstrate improved ease with feeding as evidenced by decreasing PPT#2 (self feeding) by 3 secs Baseline: PPT #2: 24.72 Goal status: IN PROGRESS  3.  Pt will verbalize understanding of strategies, safety consideration, and AE needs to ensure safety due to decreased sensation.  Baseline:  Goal status: IN PROGRESS  4.  Pt will verbalize improved ROM and motor control with ability to complete hygiene post toileting  Baseline:  Goal status: IN PROGRESS   ASSESSMENT:  CLINICAL IMPRESSION: Treatment session with focus on visual attention to UE during functional grasp, coordination activities, and functional reach. Pt continues to demonstrate impaired coordination, sensation, and limited mobility of index finger on LUE.  Pt continues to demonstrate greater impairment L > R. Pt demonstrating mild improvement with gross grasp and precision pinch, however continues to struggle with in-hand manipulation.  PERFORMANCE DEFICITS in functional  skills including ADLs, IADLs, coordination, dexterity, proprioception, sensation, ROM, strength, pain, flexibility, FMC, GMC, mobility, balance, body mechanics, endurance, cardiopulmonary status limiting function, decreased knowledge of use of DME, skin integrity, and UE functional use.  IMPAIRMENTS are limiting patient from ADLs and IADLs.   COMORBIDITIES may have co-morbidities  that affects occupational performance. Patient will benefit from skilled OT to address above impairments and improve overall function.  MODIFICATION OR ASSISTANCE TO COMPLETE EVALUATION: Min-Moderate modification of tasks or assist with assess necessary to complete an evaluation.  OT OCCUPATIONAL PROFILE AND HISTORY: Detailed assessment: Review of records and additional review of physical, cognitive, psychosocial history related to current functional performance.  CLINICAL DECISION MAKING: Moderate -  several treatment options, min-mod task modification necessary  REHAB POTENTIAL: Good  EVALUATION COMPLEXITY: Moderate    PLAN: OT FREQUENCY: 1-2x/week 2x/week initially and may decrease to 1x/week pending progress  OT DURATION: 8 weeks  PLANNED INTERVENTIONS: self care/ADL training, therapeutic exercise, therapeutic activity, neuromuscular re-education, manual therapy, passive range of motion, balance training, functional mobility training, paraffin, fluidotherapy, moist heat, patient/family education, energy conservation, and DME and/or AE instructions  RECOMMENDED OTHER SERVICES: NA  CONSULTED AND AGREED WITH PLAN OF CARE: Patient and family member/caregiver  PLAN FOR NEXT SESSION: review coordination exercises and add as appropriate, review strengthening HEP/theraputty HEP. Engage in functional grasp and increasing reach.   Simonne Come, OTR/L 10/19/2021, 10:36 AM

## 2021-10-24 ENCOUNTER — Ambulatory Visit: Payer: Medicare HMO | Admitting: Occupational Therapy

## 2021-10-24 DIAGNOSIS — R2681 Unsteadiness on feet: Secondary | ICD-10-CM

## 2021-10-24 DIAGNOSIS — R278 Other lack of coordination: Secondary | ICD-10-CM

## 2021-10-24 DIAGNOSIS — M6281 Muscle weakness (generalized): Secondary | ICD-10-CM

## 2021-10-24 DIAGNOSIS — R208 Other disturbances of skin sensation: Secondary | ICD-10-CM

## 2021-10-24 NOTE — Therapy (Signed)
OUTPATIENT OCCUPATIONAL THERAPY Treatment Session Patient Name: Jon Williams MRN: [id number] DOB:1950/07/09, 71 y.o., male Today's Date: 10/24/2021  PCP: [name], MD REFERRING PROVIDER: Debbe Odea, MD    OT End of Session - 10/24/21 0935     Visit Number 8    Number of Visits 13    Date for OT Re-Evaluation 11/24/21    Authorization Type [employer]    OT Start Time 0932    OT Stop Time 4098    OT Time Calculation (min) 42 min                    Past Medical History:  Diagnosis Date   AICD (automatic cardioverter/defibrillator) present 05/25/2016   biv icd   Bunion, right    Chronic combined systolic and diastolic CHF (congestive heart failure) ([place])    Echo 1/18: Mild conc LVH, EF 15-20, severe diff HK, inf and inf-septal AK, Gr 3 DD, mild to mod MR, severe LAE, mod reduced RVSF, mod RAE, mild TR, PASP 50   Coronary artery disease involving native coronary artery without angina pectoris 04/17/2016   LHC 1/18: pLCx 30, mLCx 20, mRCA 40, dRCA 20, LVEDP 23, mean RA 8, PA 42/20, PCWP 17   Diabetes mellitus without complication (HCC)    DJD (degenerative joint disease)    Gastric cancer ([place])    History of atrial fibrillation    History of atrial flutter    History of cardiomegaly 06/07/2016   Noted on CXR   History of colon polyps 06/28/2017   Noted on colonoscopy   LBBB (left bundle branch block)    NICM (nonischemic cardiomyopathy) ([place])    Echo 1/18:  Mild conc LVH, EF 15-20, severe diff HK, inf and inf-septal AK, Gr 3 DD, mild to mod MR, severe LAE, mod reduced RVSF, mod RAE, mild TR, PASP 50   OA (osteoarthritis)    knee   Other secondary pulmonary hypertension ([place]) 04/17/2016   Prostate cancer ([place]) 2019   Sigmoid diverticulosis 06/28/2017   Noted on colonoscopy   Past Surgical History:  Procedure Laterality Date   BIOPSY  12/01/2020   Procedure: BIOPSY;  Surgeon: [name], MD;  Location: [facility];  Service: Endoscopy;;    BIOPSY  06/09/2021   Procedure: BIOPSY;  Surgeon: [name], MD;  Location: [facility];  Service: Gastroenterology;;   BIV ICD INSERTION CRT-D N/A 05/25/2016   Procedure: BiV ICD Insertion CRT-D;  Surgeon: [name], MD;  Location: [facility];  Service: Cardiovascular;  Laterality: N/A;   CARDIAC CATHETERIZATION N/A 03/02/2016   Procedure: Right/Left Heart Cath and Coronary Angiography;  Surgeon: Nelva Bush, MD;  Location: [facility];  Service: Cardiovascular;  Laterality: N/A;   CARDIOVERSION N/A 07/17/2016   Procedure: Cardioversion;  Surgeon: [name], MD;  Location: [place];  Service: Cardiovascular;  Laterality: N/A;   COLONOSCOPY WITH PROPOFOL N/A 06/28/2017   Procedure: COLONOSCOPY WITH PROPOFOL;  Surgeon: [name], MD;  Location: [facility];  Service: Endoscopy;  Laterality: N/A;   colonscopy  2009   ESOPHAGOGASTRODUODENOSCOPY N/A 12/01/2020   Procedure: ESOPHAGOGASTRODUODENOSCOPY (EGD);  Surgeon: [name], MD;  Location: [place] [facility];  Service: Endoscopy;  Laterality: N/A;  IDA/guaiac positive stools   ESOPHAGOGASTRODUODENOSCOPY (EGD) WITH PROPOFOL N/A 12/16/2020   Procedure: ESOPHAGOGASTRODUODENOSCOPY (EGD) WITH PROPOFOL;  Surgeon: [name], MD;  Location: [facility];  Service: Endoscopy;  Laterality: N/A;   ESOPHAGOGASTRODUODENOSCOPY (EGD) WITH PROPOFOL N/A 06/09/2021   Procedure:  ESOPHAGOGASTRODUODENOSCOPY (EGD) WITH PROPOFOL;  Surgeon: [name], MD;  [facility]: [facility];  Service: Gastroenterology;  Laterality: N/A;   GOLD SEED IMPLANT N/A 12/24/2017   Procedure: GOLD SEED IMPLANT, TRANSERINEAL;  Surgeon: Festus Aloe, MD;  Location: [facility];  Service: Urology;  Laterality: N/A;   INSERT / REPLACE / REMOVE PACEMAKER     LEAD REVISION  10/10/2018   LEAD REVISION/REPAIR N/A 10/10/2018   Procedure: LEAD REVISION/REPAIR;  Surgeon: [name], MD;  Location: [facility];  Service: Cardiovascular;   Laterality: N/A;   POLYPECTOMY  06/28/2017   Procedure: POLYPECTOMY;  Surgeon: [name], MD;  [facility];  Service: Endoscopy;;  ascending and descending colon polyp   PORTACATH PLACEMENT Right 12/23/2020   Procedure: INSERTION PORT-A-CATH;  Surgeon: [name], MD;  Location: [facility];  Service: General;  Laterality: Right;   PROSTATE BIOPSY  02/20/2017   SPACE OAR INSTILLATION N/A 12/24/2017   Procedure: SPACE OAR INSTILLATION;  Surgeon: Festus Aloe, MD;  Location: [facility];  Service: Urology;  Laterality: N/A;   TOTAL KNEE ARTHROPLASTY Right 08/16/2017   Procedure: RIGHT TOTAL KNEE ARTHROPLASTY;  Surgeon: [name], MD;  Location: [facility];  Service: Orthopedics;  Laterality: Right;   UPPER ESOPHAGEAL ENDOSCOPIC ULTRASOUND (EUS) N/A 12/16/2020   Procedure: UPPER ESOPHAGEAL ENDOSCOPIC ULTRASOUND (EUS);  Surgeon: [name], MD;  Location: [facility];  Service: Endoscopy;  Laterality: N/A;   Patient Active Problem List   Diagnosis Date Noted   Malnutrition of moderate degree 09/15/2021   DM (diabetes mellitus), type 2 ([place]) 09/14/2021   Essential hypertension 09/13/2021   Dyslipidemia 09/13/2021   DNR (do not resuscitate) 09/13/2021   Port-A-Cath in place 06/15/2021   Hypotension due to hypovolemia 04/04/2021   Nausea vomiting and diarrhea 04/04/2021   Thrombocytopenia ([place]) 04/04/2021   Leukocytosis 04/04/2021   Acute renal failure superimposed on stage 3a chronic kidney disease ([place]) 04/04/2021   Diarrhea 04/01/2021   Occult blood in stools 04/01/2021   Foot ulcer, right ([place]) 04/01/2021   Hypotension 04/01/2021   Dehydration 04/01/2021   Iron deficiency anemia due to chronic blood loss 12/12/2020   Gastric cancer ([place]) 12/08/2020   Symptomatic anemia 11/30/2020   Gastrointestinal hemorrhage    Corns and callosities 08/07/2019   Blood clotting disorder ([place]) 05/06/2019   Coagulation defect ([place]) 05/06/2019   Pacemaker lead malfunction 10/10/2018    Pacemaker complications 53/74/8270   Pacemaker lead failure, sequela 10/10/2018   S/P knee replacement 08/16/2017   S/P total knee arthroplasty 08/16/2017   Malignant neoplasm of prostate ([place]) 04/02/2017   ICD (implantable cardioverter-defibrillator) in place    Persistent atrial fibrillation ([place]) 05/25/2016   Atrial fibrillation ([place]) 05/25/2016   Coronary artery disease involving native coronary artery without angina pectoris 04/17/2016   Other secondary pulmonary hypertension ([place]) 04/17/2016   Acute on chronic combined systolic and diastolic CHF (congestive heart failure) (HCC)    NICM (nonischemic cardiomyopathy) ([place])    LBBB (left bundle branch block)     ONSET DATE: noted onset of increased symptoms and loss of function during chemo around February 2023 (REFERRAL Date: 09/15/21)  REFERRING DIAG: I50.9 (ICD-10-CM) - CHF (congestive heart failure)  THERAPY DIAG:  Other lack of coordination  Muscle weakness (generalized)  Other disturbances of skin sensation  Unsteadiness on feet  Rationale for Evaluation and Treatment Rehabilitation  SUBJECTIVE:   SUBJECTIVE STATEMENT: Pt reports getting outside and walking some over the weekend. Pt accompanied by: self and significant other ([place])  PERTINENT  HISTORY: Had a recent bout of chemo for his gastric tumor, ICD, CAD, a-fib, gastric CA, GI bleed, CHF, hypotension, LVEF 20-25%,   PRECAUTIONS: Fall  WEIGHT BEARING RESTRICTIONS No  PAIN:  Are you having pain? Yes: NPRS scale: 5/10 Pain location: fingers Pain description: stiffness, pins and needles Aggravating factors: [place] tasks Relieving factors: rest   PATIENT GOALS to become independent again  OBJECTIVE:   HAND DOMINANCE: Right  ADLs: Eating: decreased control of utensil, is currently using built up handles Grooming: Mod I UB Dressing: typically wears elastic waist and pull over shirts LB Dressing: wife assists with setup of items, pt requires increased  time, now wearing elastic waist pants and slip on shoes Toileting: Mod I, requires increased time for hygiene post toileting Bathing: Setup for bathing with wife applying wash mitts and adjusting water, pt able to wash body Tub Shower transfers: tub/shower transfer with supervision from spouse, has tub transfer bench and grab bars Equipment: Transfer tub bench, Grab bars, and bed side commode  FUNCTIONAL OUTCOME MEASURES: Physical performance test: PPT #2: 24.72*  UPPER EXTREMITY ROM     Active ROM Right eval Left eval  Shoulder flexion 130 126  Shoulder abduction    Shoulder adduction    Shoulder extension    Shoulder internal rotation    Shoulder external rotation    Elbow flexion    Elbow extension    Wrist flexion    Wrist extension    Wrist ulnar deviation    Wrist radial deviation    Wrist pronation    Wrist supination    (Blank rows = not tested)   UPPER EXTREMITY MMT:     MMT Right eval Left eval  Shoulder flexion 4+/5 4+/5  Shoulder abduction    Shoulder adduction    Shoulder extension    Shoulder internal rotation    Shoulder external rotation    Middle trapezius    Lower trapezius    Elbow flexion 4+/5 4+/5  Elbow extension 4+/5 4+/5  Wrist flexion    Wrist extension    Wrist ulnar deviation    Wrist radial deviation    Wrist pronation    Wrist supination    (Blank rows = not tested)   COORDINATION: Finger Nose Finger test: slow bilaterally, required demonstration for technique Box and Blocks:  Right 25 blocks, Left 13 blocks 10/17/21 - Right: 25 blocks, Left: 18 blocks  SENSATION: Light touch: Impaired  Stereognosis: Impaired  Hot/Cold: Impaired   COGNITION: Overall cognitive status:  Pt required demonstration and multimodal cues throughout evaluation, question further cognitive impairments.  VISION: Subjective report: s/p chemo pt with increased watery eyes, now wearing glasses instead of contacts Baseline vision: Wears glasses all the  time --------------------------------------------------------------------------------------------------------------------------------------------------------   TODAY'S TREATMENT:  Coordination: ball rotation on table as pt unable to complete in hand, therefore utilizing table top for support.  Pt able to rotate clockwise and counter clockwise with R hand, requiring increased effort when rotating counter clockwise with L hand. GMC: tossing ball from R <> L hand.  Initiated movement with sliding ball from hand to hand along table, then increased challenge to attempting to toss R <> L.  Pt demonstrating difficulty with catching ball with L hand, however demonstrating improvements in accuracy and release of ball with L hand.  Discussed reactionary movements and decreased time to motor plan for catching ball.  Further increased challenge to tossing and catching ball with R hand and then L hand.  Pt able to catch  up to 6 in a row with R hand, unable to catch >1 in a row with L hand, however still catching ~40% of tossed balls.   Dynamic standing balance: alternating UE support progressing to releasing BUE while reaching to retreive sticky notes from wall in numerical order to challenge balance and endurance.  Pt tolerated standing 4 mins while alternating reaching with alternating UE.  Therapist providing close supervision for increased safety/security during standing. Pt demonstrating mild difficulty when removing notes from wall due to decreased coordination.   PATIENT EDUCATION: Education details: Educated on functional carryover of grasp and reaching tasks into home making/IADLs Person educated: Patient and Spouse Education method: Customer service manager Education comprehension: verbalized understanding   HOME EXERCISE PROGRAM: Coordination exercises - See pt instructions  Access Code: [email or web address] URL: [email or web address] Date: 10/05/2021 Prepared by: [facility]  Exercises - Putty Squeezes  - 3 x weekly - 1 sets - 10 reps - Rolling Putty on Table  - 3 x weekly - 1 sets - 10 reps - Thumb Opposition with Putty  - 3 x weekly - 1 sets - 10 reps - "Duck mouth"  - 3 x weekly - 1 sets - 10 reps    GOALS: Goals reviewed with patient? Yes  SHORT TERM GOALS: Target date: 10/27/21  Pt will be independent with HEP for [facility] to increase coordination as needed for ADLs and IADLs. Baseline: Goal status: IN PROGRESS  2.  Pt will demonstrate improved UE functional use for ADLs as evidenced by increasing box/ blocks score by 3 blocks with LUE Baseline: Right 25 blocks, Left 13 blocks Goal status: MET - 18 blocks on 10/17/21  3.  Pt will demonstrate ability to retrieve a lightweight object with BUE at moderate height range to demonstrate increased UE strength as needed to complete ADLs and IADLs.  Baseline: R shoulder 130* and L shoulder 124* Goal status: IN PROGRESS    LONG TERM GOALS: Target date: 11/24/21  Pt will demonstrate improved UE functional use for ADLs as evidenced by increasing box/ blocks score by 6 blocks with LUE Baseline: Right 25 blocks, Left 13 blocks Goal status: IN PROGRESS  2.  Pt will demonstrate improved ease with feeding as evidenced by decreasing PPT#2 (self feeding) by 3 secs Baseline: PPT #2: 24.72 Goal status: IN PROGRESS  3.  Pt will verbalize understanding of strategies, safety consideration, and AE needs to ensure safety due to decreased sensation.  Baseline:  Goal status: IN PROGRESS  4.  Pt will verbalize improved ROM and motor control with ability to complete hygiene post toileting  Baseline:  Goal status: IN PROGRESS   ASSESSMENT:  CLINICAL IMPRESSION: Treatment session with focus on visual attention to UE during coordination activities and functional reach. Pt continues to demonstrate impaired coordination, sensation, and limited mobility of index finger on LUE.  Pt continues to  demonstrate greater impairment L > R. Pt receptive to modifications of tasks and reports understanding of "going back to the basics" as needed to facilitate increased motor control and carryover with progression of tasks.  PERFORMANCE DEFICITS in functional skills including ADLs, IADLs, coordination, dexterity, proprioception, sensation, ROM, strength, pain, flexibility, FMC, GMC, mobility, balance, body mechanics, endurance, cardiopulmonary status limiting function, decreased knowledge of use of DME, skin integrity, and UE functional use.  IMPAIRMENTS are limiting patient from ADLs and IADLs.   COMORBIDITIES may have co-morbidities  that affects occupational performance. Patient will benefit from skilled OT to  address above impairments and improve overall function.  MODIFICATION OR ASSISTANCE TO COMPLETE EVALUATION: Min-Moderate modification of tasks or assist with assess necessary to complete an evaluation.  OT OCCUPATIONAL PROFILE AND HISTORY: Detailed assessment: Review of records and additional review of physical, cognitive, psychosocial history related to current functional performance.  CLINICAL DECISION MAKING: Moderate - several treatment options, min-mod task modification necessary  REHAB POTENTIAL: Good  EVALUATION COMPLEXITY: Moderate    PLAN: OT FREQUENCY: 1-2x/week 2x/week initially and may decrease to 1x/week pending progress  OT DURATION: 8 weeks  PLANNED INTERVENTIONS: self care/ADL training, therapeutic exercise, therapeutic activity, neuromuscular re-education, manual therapy, passive range of motion, balance training, functional mobility training, paraffin, fluidotherapy, moist heat, patient/family education, energy conservation, and DME and/or AE instructions  RECOMMENDED OTHER SERVICES: NA  CONSULTED AND AGREED WITH PLAN OF CARE: Patient and family member/caregiver  PLAN FOR NEXT SESSION: review coordination exercises and add as appropriate, review strengthening  HEP/theraputty HEP. Engage in functional grasp and increasing reach.   Simonne Come, OTR/L 10/24/2021, 9:36 AM

## 2021-10-26 ENCOUNTER — Ambulatory Visit: Payer: Medicare HMO | Admitting: Occupational Therapy

## 2021-10-26 ENCOUNTER — Ambulatory Visit: Payer: Medicare HMO | Attending: Cardiovascular Disease | Admitting: Cardiovascular Disease

## 2021-10-26 ENCOUNTER — Encounter: Payer: Self-pay | Admitting: Cardiovascular Disease

## 2021-10-26 VITALS — BP 92/64 | HR 73 | Ht 66.0 in | Wt 186.0 lb

## 2021-10-26 DIAGNOSIS — I5042 Chronic combined systolic (congestive) and diastolic (congestive) heart failure: Secondary | ICD-10-CM | POA: Diagnosis not present

## 2021-10-26 DIAGNOSIS — H5213 Myopia, bilateral: Secondary | ICD-10-CM | POA: Diagnosis not present

## 2021-10-26 DIAGNOSIS — M6281 Muscle weakness (generalized): Secondary | ICD-10-CM

## 2021-10-26 DIAGNOSIS — R278 Other lack of coordination: Secondary | ICD-10-CM

## 2021-10-26 DIAGNOSIS — R208 Other disturbances of skin sensation: Secondary | ICD-10-CM

## 2021-10-26 DIAGNOSIS — H52223 Regular astigmatism, bilateral: Secondary | ICD-10-CM | POA: Diagnosis not present

## 2021-10-26 DIAGNOSIS — H524 Presbyopia: Secondary | ICD-10-CM | POA: Diagnosis not present

## 2021-10-26 DIAGNOSIS — R2681 Unsteadiness on feet: Secondary | ICD-10-CM | POA: Diagnosis not present

## 2021-10-26 NOTE — Therapy (Signed)
OUTPATIENT OCCUPATIONAL THERAPY Treatment Session Patient Name: Jon Williams MRN: [id number] DOB:1950-11-06, 71 y.o., male Today's Date: 10/26/2021  PCP: [name], MD REFERRING PROVIDER: Debbe Odea, MD    OT End of Session - 10/26/21 1407     Visit Number 9    Number of Visits 13    Date for OT Re-Evaluation 11/24/21    Authorization Type [employer]    OT Start Time 0865    OT Stop Time 7846    OT Time Calculation (min) 43 min                     Past Medical History:  Diagnosis Date   AICD (automatic cardioverter/defibrillator) present 05/25/2016   biv icd   Bunion, right    Chronic combined systolic and diastolic CHF (congestive heart failure) ([place])    Echo 1/18: Mild conc LVH, EF 15-20, severe diff HK, inf and inf-septal AK, Gr 3 DD, mild to mod MR, severe LAE, mod reduced RVSF, mod RAE, mild TR, PASP 50   Coronary artery disease involving native coronary artery without angina pectoris 04/17/2016   LHC 1/18: pLCx 30, mLCx 20, mRCA 40, dRCA 20, LVEDP 23, mean RA 8, PA 42/20, PCWP 17   Diabetes mellitus without complication (HCC)    DJD (degenerative joint disease)    Gastric cancer ([place])    History of atrial fibrillation    History of atrial flutter    History of cardiomegaly 06/07/2016   Noted on CXR   History of colon polyps 06/28/2017   Noted on colonoscopy   LBBB (left bundle branch block)    NICM (nonischemic cardiomyopathy) ([place])    Echo 1/18:  Mild conc LVH, EF 15-20, severe diff HK, inf and inf-septal AK, Gr 3 DD, mild to mod MR, severe LAE, mod reduced RVSF, mod RAE, mild TR, PASP 50   OA (osteoarthritis)    knee   Other secondary pulmonary hypertension ([place]) 04/17/2016   Prostate cancer ([place]) 2019   Sigmoid diverticulosis 06/28/2017   Noted on colonoscopy   Past Surgical History:  Procedure Laterality Date   BIOPSY  12/01/2020   Procedure: BIOPSY;  Surgeon: [name], MD;  Location: [facility];  Service: Endoscopy;;    BIOPSY  06/09/2021   Procedure: BIOPSY;  Surgeon: [name], MD;  Location: [facility];  Service: Gastroenterology;;   BIV ICD INSERTION CRT-D N/A 05/25/2016   Procedure: BiV ICD Insertion CRT-D;  Surgeon: [name], MD;  Location: [facility];  Service: Cardiovascular;  Laterality: N/A;   CARDIAC CATHETERIZATION N/A 03/02/2016   Procedure: Right/Left Heart Cath and Coronary Angiography;  Surgeon: Nelva Bush, MD;  Location: [facility];  Service: Cardiovascular;  Laterality: N/A;   CARDIOVERSION N/A 07/17/2016   Procedure: Cardioversion;  Surgeon: [name], MD;  Location: [facility];  Service: Cardiovascular;  Laterality: N/A;   COLONOSCOPY WITH PROPOFOL N/A 06/28/2017   Procedure: COLONOSCOPY WITH PROPOFOL;  Surgeon: [name], MD;  Location: [facility];  Service: Endoscopy;  Laterality: N/A;   colonscopy  2009   ESOPHAGOGASTRODUODENOSCOPY N/A 12/01/2020   Procedure: ESOPHAGOGASTRODUODENOSCOPY (EGD);  Surgeon: [name], MD;  Location: [facility];  Service: Endoscopy;  Laterality: N/A;  IDA/guaiac positive stools   ESOPHAGOGASTRODUODENOSCOPY (EGD) WITH PROPOFOL N/A 12/16/2020   Procedure: ESOPHAGOGASTRODUODENOSCOPY (EGD) WITH PROPOFOL;  Surgeon: [name], MD;  [facility]: [facility];  Service: Endoscopy;  Laterality: N/A;   ESOPHAGOGASTRODUODENOSCOPY (EGD) WITH PROPOFOL N/A 06/09/2021  Procedure: ESOPHAGOGASTRODUODENOSCOPY (EGD) WITH PROPOFOL;  Surgeon: [name], MD;  [facility]: [facility];  Service: Gastroenterology;  Laterality: N/A;   GOLD SEED IMPLANT N/A 12/24/2017   Procedure: GOLD SEED IMPLANT, TRANSERINEAL;  Surgeon: Festus Aloe, MD;  Location: [facility];  Service: Urology;  Laterality: N/A;   INSERT / REPLACE / REMOVE PACEMAKER     LEAD REVISION  10/10/2018   LEAD REVISION/REPAIR N/A 10/10/2018   Procedure: LEAD REVISION/REPAIR;  Surgeon: [name], MD;  Location: [facility];  Service: Cardiovascular;   Laterality: N/A;   POLYPECTOMY  06/28/2017   Procedure: POLYPECTOMY;  Surgeon: [name], MD;  [facility];  Service: Endoscopy;;  ascending and descending colon polyp   PORTACATH PLACEMENT Right 12/23/2020   Procedure: INSERTION PORT-A-CATH;  Surgeon: [name], MD;  Location: [facility];  Service: General;  Laterality: Right;   PROSTATE BIOPSY  02/20/2017   SPACE OAR INSTILLATION N/A 12/24/2017   Procedure: SPACE OAR INSTILLATION;  Surgeon: Festus Aloe, MD;  Location: [facility];  Service: Urology;  Laterality: N/A;   TOTAL KNEE ARTHROPLASTY Right 08/16/2017   Procedure: RIGHT TOTAL KNEE ARTHROPLASTY;  Surgeon: [name], MD;  Location: [facility];  Service: Orthopedics;  Laterality: Right;   UPPER ESOPHAGEAL ENDOSCOPIC ULTRASOUND (EUS) N/A 12/16/2020   Procedure: UPPER ESOPHAGEAL ENDOSCOPIC ULTRASOUND (EUS);  Surgeon: [name], MD;  Location: [facility];  Service: Endoscopy;  Laterality: N/A;   Patient Active Problem List   Diagnosis Date Noted   Malnutrition of moderate degree 09/15/2021   DM (diabetes mellitus), type 2 ([place]) 09/14/2021   Essential hypertension 09/13/2021   Dyslipidemia 09/13/2021   DNR (do not resuscitate) 09/13/2021   Port-A-Cath in place 06/15/2021   Hypotension due to hypovolemia 04/04/2021   Nausea vomiting and diarrhea 04/04/2021   Thrombocytopenia ([place]) 04/04/2021   Leukocytosis 04/04/2021   Acute renal failure superimposed on stage 3a chronic kidney disease ([place]) 04/04/2021   Diarrhea 04/01/2021   Occult blood in stools 04/01/2021   Foot ulcer, right ([place]) 04/01/2021   Hypotension 04/01/2021   Dehydration 04/01/2021   Iron deficiency anemia due to chronic blood loss 12/12/2020   Gastric cancer ([place]) 12/08/2020   Symptomatic anemia 11/30/2020   Gastrointestinal hemorrhage    Corns and callosities 08/07/2019   Blood clotting disorder ([place]) 05/06/2019   Coagulation defect ([place]) 05/06/2019   Pacemaker lead malfunction 10/10/2018    Pacemaker complications 71/24/5809   Pacemaker lead failure, sequela 10/10/2018   S/P knee replacement 08/16/2017   S/P total knee arthroplasty 08/16/2017   Malignant neoplasm of prostate ([place]) 04/02/2017   ICD (implantable cardioverter-defibrillator) in place    Persistent atrial fibrillation ([place]) 05/25/2016   Atrial fibrillation ([place]) 05/25/2016   Coronary artery disease involving native coronary artery without angina pectoris 04/17/2016   Other secondary pulmonary hypertension ([place]) 04/17/2016   Acute on chronic combined systolic and diastolic CHF (congestive heart failure) (HCC)    NICM (nonischemic cardiomyopathy) ([place])    LBBB (left bundle branch block)     ONSET DATE: noted onset of increased symptoms and loss of function during chemo around February 2023 (REFERRAL Date: 09/15/21)  REFERRING DIAG: I50.9 (ICD-10-CM) - CHF (congestive heart failure)  THERAPY DIAG:  Other lack of coordination  Muscle weakness (generalized)  Other disturbances of skin sensation  Unsteadiness on feet  Rationale for Evaluation and Treatment Rehabilitation  SUBJECTIVE:   SUBJECTIVE STATEMENT: Pt reports doing HEP/putty exercises daily. Pt accompanied by: self and significant other ([place])  PERTINENT HISTORY: Had a  recent bout of chemo for his gastric tumor, ICD, CAD, a-fib, gastric CA, GI bleed, CHF, hypotension, LVEF 20-25%,   PRECAUTIONS: Fall  WEIGHT BEARING RESTRICTIONS No  PAIN:  Are you having pain? Yes: NPRS scale: 6/10 Pain location: fingers Pain description: stiffness, pins and needles Aggravating factors: [place] tasks Relieving factors: rest   PATIENT GOALS to become independent again  OBJECTIVE:   HAND DOMINANCE: Right  ADLs: Eating: decreased control of utensil, is currently using built up handles Grooming: Mod I UB Dressing: typically wears elastic waist and pull over shirts LB Dressing: wife assists with setup of items, pt requires increased time, now wearing  elastic waist pants and slip on shoes Toileting: Mod I, requires increased time for hygiene post toileting Bathing: Setup for bathing with wife applying wash mitts and adjusting water, pt able to wash body Tub Shower transfers: tub/shower transfer with supervision from spouse, has tub transfer bench and grab bars Equipment: Transfer tub bench, Grab bars, and bed side commode  FUNCTIONAL OUTCOME MEASURES: Physical performance test: PPT #2: 24.72* 10/26/21: PPT #2: 22.16 sec  UPPER EXTREMITY ROM     Active ROM Right eval Right 10/26/21 Left eval Left 10/26/21  Shoulder flexion 130 142 126 138  Shoulder abduction      Shoulder adduction      Shoulder extension      Shoulder internal rotation      Shoulder external rotation      Elbow flexion      Elbow extension      Wrist flexion      Wrist extension      Wrist ulnar deviation      Wrist radial deviation      Wrist pronation      Wrist supination      (Blank rows = not tested)   UPPER EXTREMITY MMT:     MMT Right eval Left eval  Shoulder flexion 4+/5 4+/5  Shoulder abduction    Shoulder adduction    Shoulder extension    Shoulder internal rotation    Shoulder external rotation    Middle trapezius    Lower trapezius    Elbow flexion 4+/5 4+/5  Elbow extension 4+/5 4+/5  Wrist flexion    Wrist extension    Wrist ulnar deviation    Wrist radial deviation    Wrist pronation    Wrist supination    (Blank rows = not tested)   COORDINATION: Finger Nose Finger test: slow bilaterally, required demonstration for technique Box and Blocks:  Right 25 blocks, Left 13 blocks 10/17/21 - Right: 25 blocks, Left: 18 blocks  SENSATION: Light touch: Impaired  Stereognosis: Impaired  Hot/Cold: Impaired   COGNITION: Overall cognitive status:  Pt required demonstration and multimodal cues throughout evaluation, question further cognitive impairments.  VISION: Subjective report: s/p chemo pt with increased watery eyes, now  wearing glasses instead of contacts Baseline vision: Wears glasses all the time --------------------------------------------------------------------------------------------------------------------------------------------------------   TODAY'S TREATMENT:  Self-feeding: transferring beans out of bowl with spoon. Pt initially demonstrating decreased grasp on spoon, therefore placed built up handle on spoon to attempt to facilitate increased grasp.  Pt completed PPT #2 (self-feeding) in 39.78 seconds with built up handle.  Completed additional attempt after moving handle more distally.  Completed in 11.19 seconds and 15.50 seconds with built up handle and grasp more distal on handle.   Sensation: educated on sensation and loss of feeling recommendations.  Discussed use of vision to compensate for impaired sensation and provided with  handout of recommendations to increase safety to compensate for impaired sensation.  Discussed use of warm water for improved mobility, but advised to have spouse or use of thermometer to check temp of water. GMC: stacking cups in 10 frame pyramid form to facilitate increased functional and sustained grasp on cups. Pt demonstrating increased challenge when completing with LUE>RUE, dropping 4 cups and demonstrating more gross grasp when picking up and manipulating cups.  OT providing demonstration and verbal cues to increase gasp with cues to place palm around cup when removing to facilitate increased full grasp.     PATIENT EDUCATION: Education details: Educated on functional carryover of grasp and reaching tasks into home making/IADLs Person educated: Patient and Spouse Education method: Customer service manager Education comprehension: verbalized understanding   HOME EXERCISE PROGRAM: Coordination exercises - See pt instructions  Access Code: [email or web address] URL: [email or web address] Date: 10/05/2021 Prepared by: [facility]  Exercises - Putty Squeezes  - 3 x weekly - 1 sets - 10 reps - Rolling Putty on Table  - 3 x weekly - 1 sets - 10 reps - Thumb Opposition with Putty  - 3 x weekly - 1 sets - 10 reps - "Duck mouth"  - 3 x weekly - 1 sets - 10 reps    GOALS: Goals reviewed with patient? Yes  SHORT TERM GOALS: Target date: 10/27/21  Pt will be independent with HEP for [facility] to increase coordination as needed for ADLs and IADLs. Baseline: Goal status: MET - reports completing HEP daily  2.  Pt will demonstrate improved UE functional use for ADLs as evidenced by increasing box/ blocks score by 3 blocks with LUE Baseline: Right 25 blocks, Left 13 blocks Goal status: MET - 18 blocks on 10/17/21  3.  Pt will demonstrate ability to retrieve a lightweight object with BUE at moderate height range to demonstrate increased UE strength as needed to complete ADLs and IADLs.  Baseline: R shoulder 130* and L shoulder 124* Goal status: MET - L:138* and R: 142* on 10/26/21    LONG TERM GOALS: Target date: 11/24/21  Pt will demonstrate improved UE functional use for ADLs as evidenced by increasing box/ blocks score by 6 blocks with LUE Baseline: Right 25 blocks, Left 13 blocks Goal status: IN PROGRESS  2.  Pt will demonstrate improved ease with feeding as evidenced by decreasing PPT#2 (self feeding) by 3 secs Baseline: PPT #2: 24.72 Goal status: IN PROGRESS  3.  Pt will verbalize understanding of strategies, safety consideration, and AE needs to ensure safety due to decreased sensation.  Baseline:  Goal status: IN PROGRESS  4.  Pt will verbalize improved ROM and motor control with ability to complete hygiene post toileting  Baseline:  Goal status: IN PROGRESS   ASSESSMENT:  CLINICAL IMPRESSION: Treatment session with focus on visual attention to UE during coordination activities and functional reach. Pt is demonstrating improvements in functional reach and FMC/GMC coordination as evidenced by  progress towards STGs.  Pt continues to demonstrate greater impairment L > R. Discussed current POC and decreasing from 2x/week to 1x/week due to improvements in coordination and focus on increased engagement in HEP.  PERFORMANCE DEFICITS in functional skills including ADLs, IADLs, coordination, dexterity, proprioception, sensation, ROM, strength, pain, flexibility, FMC, GMC, mobility, balance, body mechanics, endurance, cardiopulmonary status limiting function, decreased knowledge of use of DME, skin integrity, and UE functional use.  IMPAIRMENTS are limiting patient from ADLs and IADLs.   COMORBIDITIES may  have co-morbidities  that affects occupational performance. Patient will benefit from skilled OT to address above impairments and improve overall function.  MODIFICATION OR ASSISTANCE TO COMPLETE EVALUATION: Min-Moderate modification of tasks or assist with assess necessary to complete an evaluation.  OT OCCUPATIONAL PROFILE AND HISTORY: Detailed assessment: Review of records and additional review of physical, cognitive, psychosocial history related to current functional performance.  CLINICAL DECISION MAKING: Moderate - several treatment options, min-mod task modification necessary  REHAB POTENTIAL: Good  EVALUATION COMPLEXITY: Moderate    PLAN: OT FREQUENCY: 1-2x/week 2x/week initially and may decrease to 1x/week pending progress  OT DURATION: 8 weeks  PLANNED INTERVENTIONS: self care/ADL training, therapeutic exercise, therapeutic activity, neuromuscular re-education, manual therapy, passive range of motion, balance training, functional mobility training, paraffin, fluidotherapy, moist heat, patient/family education, energy conservation, and DME and/or AE instructions  RECOMMENDED OTHER SERVICES: NA  CONSULTED AND AGREED WITH PLAN OF CARE: Patient and family member/caregiver  PLAN FOR NEXT SESSION: review coordination exercises and add as appropriate, review strengthening  HEP/theraputty HEP. Engage in functional grasp and increasing reach.   Simonne Come, OTR/L 10/26/2021, 2:08 PM

## 2021-10-26 NOTE — Patient Instructions (Signed)
Medication Instructions:  Your physician recommends that you continue on your current medications as directed. Please refer to the Current Medication list given to you today.  *If you need a refill on your cardiac medications before your next appointment, please call your pharmacy*  Lab Work: NONE If you have labs (blood work) drawn today and your tests are completely normal, you will receive your results only by: MyChart Message (if you have MyChart) OR A paper copy in the mail If you have any lab test that is abnormal or we need to change your treatment, we will call you to review the results.  Testing/Procedures: NONE  Follow-Up: At [facility], you and your health needs are our priority.  As part of our continuing mission to provide you with exceptional heart care, we have created designated Provider Care Teams.  These Care Teams include your primary Cardiologist (physician) and Advanced Practice Providers (APPs -  Physician Assistants and Nurse Practitioners) who all work together to provide you with the care you need, when you need it.  Your next appointment:   6 month(s)  The format for your next appointment:   In Person  Provider:   Philip [name], MD    Important Information About Sugar       

## 2021-10-26 NOTE — Progress Notes (Signed)
Cardiology Office Note:    Date:  10/26/2021   ID:  Jon Williams, DOB 04/12/1950, MRN [id number]  PCP:  [name], [place] [facility] Cardiologist:  Mertie Moores, MD Electrophysiologist:  Cristopher Peru, MD {   Referring MD: [name], MD   Chief Complaint  Patient presents with   Congestive Heart Failure     History of Present Illness:    Jon Williams is a 71 y.o. male with a hx of chronic combined systolic and diastolic congestive heart failure, atrial fibrillation . Was assigned to see him in 2018.  He was seen by [name] at that time.  He is seen numerous other cardiologist over the years but this is the first time a me actually meeting him in person.  He was recently admitted to the hospital with acute on chronic combined systolic heart failure.   Had  severe shortness of breath  He has a history of an ICD, persistent atrial fibrillation, prostate cancer, gastric cancer, hypertension, dyslipidemia, diabetes mellitus  They try to avoid salty foods.    He is on Entresto, Lasix Is feeling better now   BP is a bit low now  Has not had syncope or presyncope   Was dx with stomach cancer, Received chemo x 5 treatments due to severe complications . Scans have looked ok .  Just following for now    Past Medical History:  Diagnosis Date   AICD (automatic cardioverter/defibrillator) present 05/25/2016   biv icd   Bunion, right    Chronic combined systolic and diastolic CHF (congestive heart failure) ([place])    Echo 1/18: Mild conc LVH, EF 15-20, severe diff HK, inf and inf-septal AK, Gr 3 DD, mild to mod MR, severe LAE, mod reduced RVSF, mod RAE, mild TR, PASP 50   Coronary artery disease involving native coronary artery without angina pectoris 04/17/2016   LHC 1/18: pLCx 30, mLCx 20, mRCA 40, dRCA 20, LVEDP 23, mean RA 8, PA 42/20, PCWP 17   Diabetes mellitus without complication (HCC)    DJD (degenerative joint disease)    Gastric  cancer (HCC)    History of atrial fibrillation    History of atrial flutter    History of cardiomegaly 06/07/2016   Noted on CXR   History of colon polyps 06/28/2017   Noted on colonoscopy   LBBB (left bundle branch block)    NICM (nonischemic cardiomyopathy) ([place])    Echo 1/18:  Mild conc LVH, EF 15-20, severe diff HK, inf and inf-septal AK, Gr 3 DD, mild to mod MR, severe LAE, mod reduced RVSF, mod RAE, mild TR, PASP 50   OA (osteoarthritis)    knee   Other secondary pulmonary hypertension ([place]) 04/17/2016   Prostate cancer ([place]) 2019   Sigmoid diverticulosis 06/28/2017   Noted on colonoscopy    Past Surgical History:  Procedure Laterality Date   BIOPSY  12/01/2020   Procedure: BIOPSY;  Surgeon: [name], MD;  Location: [facility];  Service: Endoscopy;;   BIOPSY  06/09/2021   Procedure: BIOPSY;  Surgeon: [name], MD;  Location: [facility];  Service: Gastroenterology;;   BIV ICD INSERTION CRT-D N/A 05/25/2016   Procedure: BiV ICD Insertion CRT-D;  Surgeon: [name], MD;  Location: [facility];  Service: Cardiovascular;  Laterality: N/A;   CARDIAC CATHETERIZATION N/A 03/02/2016   Procedure: Right/Left Heart Cath and Coronary Angiography;  Surgeon: Nelva Bush, MD;  Location: [facility];  Service: Cardiovascular;  Laterality: N/A;   CARDIOVERSION N/A 07/17/2016   Procedure: Cardioversion;  Surgeon: [name], MD;  Location: [facility];  Service: Cardiovascular;  Laterality: N/A;   COLONOSCOPY WITH PROPOFOL N/A 06/28/2017   Procedure: COLONOSCOPY WITH PROPOFOL;  Surgeon: [name], MD;  Location: [facility];  Service: Endoscopy;  Laterality: N/A;   colonscopy  2009   ESOPHAGOGASTRODUODENOSCOPY N/A 12/01/2020   Procedure: ESOPHAGOGASTRODUODENOSCOPY (EGD);  Surgeon: [name], MD;  Location: [facility];  Service: Endoscopy;  Laterality: N/A;  IDA/guaiac positive stools   ESOPHAGOGASTRODUODENOSCOPY (EGD) WITH PROPOFOL N/A  12/16/2020   Procedure: ESOPHAGOGASTRODUODENOSCOPY (EGD) WITH PROPOFOL;  Surgeon: [name], MD;  [facility]: [facility];  Service: Endoscopy;  Laterality: N/A;   ESOPHAGOGASTRODUODENOSCOPY (EGD) WITH PROPOFOL N/A 06/09/2021   Procedure: ESOPHAGOGASTRODUODENOSCOPY (EGD) WITH PROPOFOL;  Surgeon: [name], MD;  [facility]: [facility];  Service: Gastroenterology;  Laterality: N/A;   GOLD SEED IMPLANT N/A 12/24/2017   Procedure: GOLD SEED IMPLANT, TRANSERINEAL;  Surgeon: Festus Aloe, MD;  Location: [facility];  Service: Urology;  Laterality: N/A;   INSERT / REPLACE / REMOVE PACEMAKER     LEAD REVISION  10/10/2018   LEAD REVISION/REPAIR N/A 10/10/2018   Procedure: LEAD REVISION/REPAIR;  Surgeon: [name], MD;  Location: [facility];  Service: Cardiovascular;  Laterality: N/A;   POLYPECTOMY  06/28/2017   Procedure: POLYPECTOMY;  Surgeon: [name], MD;  [facility];  Service: Endoscopy;;  ascending and descending colon polyp   PORTACATH PLACEMENT Right 12/23/2020   Procedure: INSERTION PORT-A-CATH;  Surgeon: [name], MD;  Location: [facility];  Service: General;  Laterality: Right;   PROSTATE BIOPSY  02/20/2017   SPACE OAR INSTILLATION N/A 12/24/2017   Procedure: SPACE OAR INSTILLATION;  Surgeon: Festus Aloe, MD;  Location: [facility];  Service: Urology;  Laterality: N/A;   TOTAL KNEE ARTHROPLASTY Right 08/16/2017   Procedure: RIGHT TOTAL KNEE ARTHROPLASTY;  Surgeon: [name], MD;  Location: [facility];  Service: Orthopedics;  Laterality: Right;   UPPER ESOPHAGEAL ENDOSCOPIC ULTRASOUND (EUS) N/A 12/16/2020   Procedure: UPPER ESOPHAGEAL ENDOSCOPIC ULTRASOUND (EUS);  Surgeon: [name], MD;  Location: [facility];  Service: Endoscopy;  Laterality: N/A;    Current Medications: Current Meds  Medication Sig   atorvastatin (LIPITOR) 40 MG tablet Take 1 tablet (40 mg total) by mouth daily.   Biotin 1000 MCG tablet Take 1,000 mcg by mouth daily with  breakfast.   cholecalciferol (VITAMIN D3) 25 MCG (1000 UT) tablet Take 1,000 Units by mouth daily with breakfast.   empagliflozin (JARDIANCE) 10 MG TABS tablet Take 1 tablet (10 mg total) by mouth daily.   furosemide (LASIX) 20 MG tablet Take 20 mg by mouth daily.   gabapentin (NEURONTIN) 100 MG capsule Take 1 capsule (100 mg total) by mouth 2 (two) times daily.   metFORMIN (GLUCOPHAGE-XR) 500 MG 24 hr tablet Take 500 mg by mouth every evening.   metoprolol succinate (TOPROL-XL) 25 MG 24 hr tablet TAKE 1 TABLET(25 MG) BY MOUTH DAILY   multivitamin (ONE-A-DAY MEN'S) TABS tablet Take 1 tablet by mouth daily with breakfast.   pantoprazole (PROTONIX) 40 MG tablet Take 1 tablet (40 mg total) by mouth 2 (two) times daily.   potassium chloride (KLOR-CON) 10 MEQ tablet Twice daily   rivaroxaban (XARELTO) 20 MG TABS tablet Take 1 tablet (20 mg total) by mouth daily with supper.   sacubitril-valsartan (ENTRESTO) 24-26 MG Take 1 tablet by mouth 2 (two) times daily.   sotalol (  BETAPACE) 80 MG tablet Take 1.5 tablets (120 mg total) by mouth in the morning and at bedtime.     Allergies:   Aspirin and Sulfa antibiotics   Social History   Socioeconomic History   Marital status: Married    Spouse name: Not on file   Number of children: 2   Years of education: Not on file   Highest education level: Not on file  Occupational History   Occupation: retired  Tobacco Use   Smoking status: Never   Smokeless tobacco: Never  Vaping Use   Vaping Use: Never used  Substance and Sexual Activity   Alcohol use: No   Drug use: No   Sexual activity: Not Currently  Other Topics Concern   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 Daughter, Beckie Busing, lives in [place]. Son, [place], lives in [place].   Social Determinants of Health   Financial Resource Strain: Low Risk  (09/26/2021)   Overall Financial Resource Strain (CARDIA)    Difficulty of Paying  Living Expenses: Not hard at all  Food Insecurity: No Food Insecurity (09/26/2021)   Hunger Vital Sign    Worried About Running Out of Food in the Last Year: Never true    Ran Out of Food in the Last Year: Never true  Transportation Needs: No Transportation Needs (09/26/2021)   PRAPARE - Hydrologist (Medical): No    Lack of Transportation (Non-Medical): No  Physical Activity: Insufficiently Active (09/26/2021)   Exercise Vital Sign    Days of Exercise per Week: 7 days    Minutes of Exercise per Session: 10 min  Stress: No Stress Concern Present (09/26/2021)   [place]    Feeling of Stress : Not at all  Social Connections: Not on file     Family History: The patient's family history includes Cancer in his father; Diabetes in his father and mother; Healthy in his brother and sister; Heart attack in his brother; Heart disease in his mother; Heart disease (age of onset: 3) in his brother; Hypertension in his father and mother.  ROS:   Please see the history of present illness.     All other systems reviewed and are negative.  EKGs/Labs/Other Studies Reviewed:    The following studies were reviewed today:   EKG:   Recent Labs: 07/28/2021: TSH 2.108 09/13/2021: ALT 55; Magnesium 1.9 09/14/2021: Hemoglobin 11.0; Platelets 151 09/26/2021: B Natriuretic Peptide 644.9; BUN 14; Creatinine, Ser 1.34; Potassium 3.9; Sodium 140  Recent Lipid Panel    Component Value Date/Time   CHOL 132 06/06/2016 1453   TRIG 53 06/06/2016 1453   HDL 60 06/06/2016 1453   CHOLHDL 2.2 06/06/2016 1453   LDLCALC 61 06/06/2016 1453     Risk Assessment/Calculations:                Physical Exam:    VS:  BP 92/64   Pulse 73   Ht '5\' 6"'$  (1.676 m)   Wt 186 lb (84.4 kg)   SpO2 99%   BMI 30.02 kg/m     Wt Readings from Last 3 Encounters:  10/26/21 186 lb (84.4 kg)  09/26/21 187 lb 12.8 oz (85.2 kg)   09/15/21 186 lb 11.7 oz (84.7 kg)     GEN:  Well nourished, well developed in no acute distress HEENT: Normal NECK: No JVD; No carotid bruits LYMPHATICS: No lymphadenopathy  CARDIAC: RRR, no murmurs, rubs, gallops RESPIRATORY:  Clear to auscultation without rales, wheezing or rhonchi  ABDOMEN: Soft, non-tender, non-distended MUSCULOSKELETAL:  No edema; No deformity  SKIN: Warm and dry NEUROLOGIC:  Alert and oriented x 3, generally weak  PSYCHIATRIC:  Normal affect   ASSESSMENT:    1. Chronic combined systolic and diastolic heart failure (HCC)    PLAN:    In order of problems listed above:  Chronic combined systolic and diastolic congestive heart failure.  He was recently hospitalized with CHF exacerbation.  Blood pressure is running a little bit low but he states that it typically is okay.  We will have him continue with the same medications. I will see him back in 6 months.  2.  History of gastric cancer: He is followed followed by oncology.  3.  History of atrial fibrillation: Follow-up with Dr. Lovena Le.  Continue Xarelto.           Medication Adjustments/Labs and Tests Ordered: Current medicines are reviewed at length with the patient today.  Concerns regarding medicines are outlined above.  No orders of the defined types were placed in this encounter.  No orders of the defined types were placed in this encounter.   Patient Instructions  Medication Instructions:  Your physician recommends that you continue on your current medications as directed. Please refer to the Current Medication list given to you today.  *If you need a refill on your cardiac medications before your next appointment, please call your pharmacy*   Lab Work: NONE If you have labs (blood work) drawn today and your tests are completely normal, you will receive your results only by: [facility] (if you have MyChart) OR A paper copy in the mail If you have any lab test that is abnormal or we  need to change your treatment, we will call you to review the results.   Testing/Procedures: NONE   Follow-Up: At [facility], you and your health needs are our priority.  As part of our continuing mission to provide you with exceptional heart care, we have created designated Provider Care Teams.  These Care Teams include your primary Cardiologist (physician) and Advanced Practice Providers (APPs -  Physician Assistants and Nurse Practitioners) who all work together to provide you with the care you need, when you need it.  Your next appointment:   6 month(s)  The format for your next appointment:   In Person  Provider:   Mertie Moores, MD       Important Information About Sugar         Signed, Mertie Moores, MD  10/26/2021 7:14 PM    [place]

## 2021-10-31 ENCOUNTER — Ambulatory Visit: Payer: Medicare HMO | Admitting: Occupational Therapy

## 2021-10-31 DIAGNOSIS — M6281 Muscle weakness (generalized): Secondary | ICD-10-CM

## 2021-10-31 DIAGNOSIS — R278 Other lack of coordination: Secondary | ICD-10-CM | POA: Diagnosis not present

## 2021-10-31 DIAGNOSIS — R208 Other disturbances of skin sensation: Secondary | ICD-10-CM | POA: Diagnosis not present

## 2021-10-31 DIAGNOSIS — R2681 Unsteadiness on feet: Secondary | ICD-10-CM | POA: Diagnosis not present

## 2021-10-31 NOTE — Therapy (Signed)
OUTPATIENT OCCUPATIONAL THERAPY Treatment Session Patient Name: Jon Williams MRN: [id number] DOB:24-Aug-1950, 71 y.o., male Today's Date: 10/31/2021  PCP: Williams Carol, MD REFERRING PROVIDER: Debbe Odea, MD   Occupational Therapy Progress Note  Dates of Reporting Period: 09/28/21 to 10/31/21  Objective Measurements: Pt has shown progress coordination with LUE during box and blocks assessment and RUE coordination during PPT #2 with self-feeding (see measurements below).    Plan: Continue to focus on safety awareness due to impaired sensation and use of vision as well as other compensatory strategies to compensate for impaired sensation.  Focus on coordination, ROM, and strengthening for BUE.  Reason Skilled Services are Required: impaired coordination, ROM, and strength, as well as impaired sensation impacting engagement in ADLs and IADLs.    OT End of Session - 10/31/21 0938     Visit Number 10    Number of Visits 13    Date for OT Re-Evaluation 11/24/21    Authorization Type [employer]    OT Start Time [phone]    OT Stop Time 1016    OT Time Calculation (min) 42 min                     Past Medical History:  Diagnosis Date   AICD (automatic cardioverter/defibrillator) present 05/25/2016   biv icd   Bunion, right    Chronic combined systolic and diastolic CHF (congestive heart failure) ([place])    Echo 1/18: Mild conc LVH, EF 15-20, severe diff HK, inf and inf-septal AK, Gr 3 DD, mild to mod MR, severe LAE, mod reduced RVSF, mod RAE, mild TR, PASP 50   Coronary artery disease involving native coronary artery without angina pectoris 04/17/2016   LHC 1/18: pLCx 30, mLCx 20, mRCA 40, dRCA 20, LVEDP 23, mean RA 8, PA 42/20, PCWP 17   Diabetes mellitus without complication (HCC)    DJD (degenerative joint disease)    Gastric cancer ([place])    History of atrial fibrillation    History of atrial flutter    History of cardiomegaly 06/07/2016   Noted on CXR    History of colon polyps 06/28/2017   Noted on colonoscopy   LBBB (left bundle branch block)    NICM (nonischemic cardiomyopathy) ([place])    Echo 1/18:  Mild conc LVH, EF 15-20, severe diff HK, inf and inf-septal AK, Gr 3 DD, mild to mod MR, severe LAE, mod reduced RVSF, mod RAE, mild TR, PASP 50   OA (osteoarthritis)    knee   Other secondary pulmonary hypertension ([place]) 04/17/2016   Prostate cancer ([place]) 2019   Sigmoid diverticulosis 06/28/2017   Noted on colonoscopy   Past Surgical History:  Procedure Laterality Date   BIOPSY  12/01/2020   Procedure: BIOPSY;  Surgeon: Jon Ada, MD;  Location: [facility];  Service: Endoscopy;;   BIOPSY  06/09/2021   Procedure: BIOPSY;  Surgeon: Jon Ada, MD;  Location: [facility];  Service: Gastroenterology;;   BIV ICD INSERTION CRT-D N/A 05/25/2016   Procedure: BiV ICD Insertion CRT-D;  Surgeon: [name], MD;  Location: [facility];  Service: Cardiovascular;  Laterality: N/A;   CARDIAC CATHETERIZATION N/A 03/02/2016   Procedure: Right/Left Heart Cath and Coronary Angiography;  Surgeon: Jon Bush, MD;  Location: [facility];  Service: Cardiovascular;  Laterality: N/A;   CARDIOVERSION N/A 07/17/2016   Procedure: Cardioversion;  Surgeon: [name], MD;  Location: [facility];  Service: Cardiovascular;  Laterality: N/A;  COLONOSCOPY WITH PROPOFOL N/A 06/28/2017   Procedure: COLONOSCOPY WITH PROPOFOL;  Surgeon: Jon Ada, MD;  Location: [facility];  Service: Endoscopy;  Laterality: N/A;   colonscopy  2009   ESOPHAGOGASTRODUODENOSCOPY N/A 12/01/2020   Procedure: ESOPHAGOGASTRODUODENOSCOPY (EGD);  Surgeon: Jon Ada, MD;  Location: [facility];  Service: Endoscopy;  Laterality: N/A;  IDA/guaiac positive stools   ESOPHAGOGASTRODUODENOSCOPY (EGD) WITH PROPOFOL N/A 12/16/2020   Procedure: ESOPHAGOGASTRODUODENOSCOPY (EGD) WITH PROPOFOL;  Surgeon: Jon Ada, MD;  [facility];  Service:  Endoscopy;  Laterality: N/A;   ESOPHAGOGASTRODUODENOSCOPY (EGD) WITH PROPOFOL N/A 06/09/2021   Procedure: ESOPHAGOGASTRODUODENOSCOPY (EGD) WITH PROPOFOL;  Surgeon: Jon Ada, MD;  [facility];  Service: Gastroenterology;  Laterality: N/A;   GOLD SEED IMPLANT N/A 12/24/2017   Procedure: GOLD SEED IMPLANT, TRANSERINEAL;  Surgeon: Jon Aloe, MD;  Location: [facility];  Service: Urology;  Laterality: N/A;   INSERT / REPLACE / REMOVE PACEMAKER     LEAD REVISION  10/10/2018   LEAD REVISION/REPAIR N/A 10/10/2018   Procedure: LEAD REVISION/REPAIR;  Surgeon: [name], MD;  Location: [facility];  Service: Cardiovascular;  Laterality: N/A;   POLYPECTOMY  06/28/2017   Procedure: POLYPECTOMY;  Surgeon: Jon Ada, MD;  [facility];  Service: Endoscopy;;  ascending and descending colon polyp   PORTACATH PLACEMENT Right 12/23/2020   Procedure: INSERTION PORT-A-CATH;  Surgeon: Jon Bolt, MD;  Location: [facility];  Service: General;  Laterality: Right;   PROSTATE BIOPSY  02/20/2017   SPACE OAR INSTILLATION N/A 12/24/2017   Procedure: SPACE OAR INSTILLATION;  Surgeon: Jon Aloe, MD;  [facility];  Service: Urology;  Laterality: N/A;   TOTAL KNEE ARTHROPLASTY Right 08/16/2017   Procedure: RIGHT TOTAL KNEE ARTHROPLASTY;  Surgeon: Jon Server, MD;  Location: [facility];  Service: Orthopedics;  Laterality: Right;   UPPER ESOPHAGEAL ENDOSCOPIC ULTRASOUND (EUS) N/A 12/16/2020   Procedure: UPPER ESOPHAGEAL ENDOSCOPIC ULTRASOUND (EUS);  Surgeon: Jon Ada, MD;  Location: [facility];  Service: Endoscopy;  Laterality: N/A;   Patient Active Problem List   Diagnosis Date Noted   Malnutrition of moderate degree 09/15/2021   DM (diabetes mellitus), type 2 (Jon [facility]) 09/14/2021   Essential hypertension 09/13/2021   Dyslipidemia 09/13/2021   DNR (do not resuscitate) 09/13/2021   Port-A-Cath in place 06/15/2021   Hypotension due to hypovolemia 04/04/2021   Nausea  vomiting and diarrhea 04/04/2021   Thrombocytopenia (Jon Williams) 04/04/2021   Leukocytosis 04/04/2021   Acute renal failure superimposed on stage 3a chronic kidney disease (Jon Williams) 04/04/2021   Diarrhea 04/01/2021   Occult blood in stools 04/01/2021   Foot ulcer, right (Jon Williams) 04/01/2021   Hypotension 04/01/2021   Dehydration 04/01/2021   Iron deficiency anemia due to chronic blood loss 12/12/2020   Gastric cancer ([place]) 12/08/2020   Symptomatic anemia 11/30/2020   Gastrointestinal hemorrhage    Corns and callosities 08/07/2019   Blood clotting disorder ([place]) 05/06/2019   Coagulation defect ([place]) 05/06/2019   Pacemaker lead malfunction 10/10/2018   Pacemaker complications 19/62/2297   Pacemaker lead failure, sequela 10/10/2018   S/P knee replacement 08/16/2017   S/P total knee arthroplasty 08/16/2017   Malignant neoplasm of prostate ([place]) 04/02/2017   ICD (implantable cardioverter-defibrillator) in place    Persistent atrial fibrillation ([place]) 05/25/2016   Atrial fibrillation ([place]) 05/25/2016   Coronary artery disease involving native coronary artery without angina pectoris 04/17/2016   Other secondary pulmonary hypertension ([place]) 04/17/2016   Acute on chronic combined systolic and diastolic CHF (congestive heart failure) ([place])  NICM (nonischemic cardiomyopathy) ([place])    LBBB (left bundle branch block)     ONSET DATE: noted onset of increased symptoms and loss of function during chemo around February 2023 (REFERRAL Date: 09/15/21)  REFERRING DIAG: I50.9 (ICD-10-CM) - CHF (congestive heart failure)  THERAPY DIAG:  Other lack of coordination  Muscle weakness (generalized)  Other disturbances of skin sensation  Unsteadiness on feet  Rationale for Evaluation and Treatment Rehabilitation  SUBJECTIVE:   SUBJECTIVE STATEMENT: Pt reports doing HEP/putty exercises daily. Pt accompanied by: self and significant other ([place])  PERTINENT HISTORY: Had a recent bout of chemo for his  gastric tumor, ICD, CAD, a-fib, gastric CA, GI bleed, CHF, hypotension, LVEF 20-25%,   PRECAUTIONS: Fall  WEIGHT BEARING RESTRICTIONS No  PAIN:  Are you having pain? Yes: NPRS scale: 6/10 Pain location: fingers Pain description: stiffness, pins and needles Aggravating factors: [place] tasks Relieving factors: rest   PATIENT GOALS to become independent again  OBJECTIVE:   HAND DOMINANCE: Right  ADLs: Eating: decreased control of utensil, is currently using built up handles Grooming: Mod I UB Dressing: typically wears elastic waist and pull over shirts LB Dressing: wife assists with setup of items, pt requires increased time, now wearing elastic waist pants and slip on shoes Toileting: Mod I, requires increased time for hygiene post toileting Bathing: Setup for bathing with wife applying wash mitts and adjusting water, pt able to wash body Tub Shower transfers: tub/shower transfer with supervision from spouse, has tub transfer bench and grab bars Equipment: Transfer tub bench, Grab bars, and bed side commode  FUNCTIONAL OUTCOME MEASURES: Physical performance test: PPT #2: 24.72* 10/26/21: PPT #2: 22.16 sec 10/31/21: PPT #2: 17.62 sec  UPPER EXTREMITY ROM     Active ROM Right eval Right 10/26/21 Left eval Left 10/26/21  Shoulder flexion 130 142 126 138  Shoulder abduction      Shoulder adduction      Shoulder extension      Shoulder internal rotation      Shoulder external rotation      Elbow flexion      Elbow extension      Wrist flexion      Wrist extension      Wrist ulnar deviation      Wrist radial deviation      Wrist pronation      Wrist supination      (Blank rows = not tested)   UPPER EXTREMITY MMT:     MMT Right eval Left eval  Shoulder flexion 4+/5 4+/5  Shoulder abduction    Shoulder adduction    Shoulder extension    Shoulder internal rotation    Shoulder external rotation    Middle trapezius    Lower trapezius    Elbow flexion 4+/5 4+/5   Elbow extension 4+/5 4+/5  Wrist flexion    Wrist extension    Wrist ulnar deviation    Wrist radial deviation    Wrist pronation    Wrist supination    (Blank rows = not tested)   COORDINATION: Finger Nose Finger test: slow bilaterally, required demonstration for technique Box and Blocks:  Right 25 blocks, Left 13 blocks 10/17/21 - Right: 25 blocks, Left: 18 blocks 10/31/21- Right: 27 blocks, Left: 18 blocks  SENSATION: Light touch: Impaired  Stereognosis: Impaired  Hot/Cold: Impaired   COGNITION: Overall cognitive status:  Pt required demonstration and multimodal cues throughout evaluation, question further cognitive impairments.  VISION: Subjective report: s/p chemo pt with increased watery eyes,  now wearing glasses instead of contacts Baseline vision: Wears glasses all the time --------------------------------------------------------------------------------------------------------------------------------------------------------   TODAY'S TREATMENT:  Box and Blocks: R: 27 and L: 18.  Pt continues to demonstrate difficulty with picking up blocks, especially during timed task bilaterally.  Pt with impaired coordination and sensation L > R. Coordination: Engaged in [place] of 1" blocks into stacks of 5.  Pt demonstrating improved motor control and ability to grasp blocks without timed component.  Pt dropping 2 of 20 with LUE and 0 of 20 with RUE.  Pt demonstrating decreased incorporation of index finger with grasp with L hand. Pinch grip: engaged in thumb opposition along foam roll to simulate theraputty with focus on increased rounding of pinch with opposition to increase functional grip as pt continues to demonstrate decreased opposition and integration of index finger into grasping tasks with LUE.  OT increased challenge to attempting to pick up small alphabet pieces with L and R hand, however due to decreased rounded opposition and integration of index finger pt with decreased  success.  Pt with improved success when picking up 1" blocks. Self-feeding: PPT #2: 17.62 sec.  Pt demonstrates improved success with use of built up handle.  Pt reports utilizing built up handles on utensils at home. Isolated finger mobility: Engaged in thumb opposition with focus on rounding digits to make "O" shape.  Pt demonstrating difficulty with isolate finger extension from table top, therefore educated to complete with blocking at MCP with improved success.  OT reiterated focus on quality of movements and not speed at this time.   PATIENT EDUCATION: Education details: Educated on functional carryover of grasp and reaching tasks into home making/IADLs Person educated: Patient and Spouse Education method: Customer service manager Education comprehension: verbalized understanding   HOME EXERCISE PROGRAM: Coordination exercises - See pt instructions  Access Code: [email or web address] URL: [email or web address] Date: 10/31/2021 Prepared by: [facility]  Exercises - Putty Squeezes  - 1 x daily - 1 sets - 10 reps - "Duck mouth"  - 1 x daily - 1 sets - 10 reps - Rolling Putty on Table  - 1 x daily - 1 sets - 10 reps - Thumb Opposition with Putty  - 1 x daily - 1 sets - 10 reps - Thumb AROM Opposition  - 1 x daily - 2 sets - 10 reps - Seated Finger MP Extension AROM with Blocking  - 1 x daily - 2 sets - 10 reps    GOALS: Goals reviewed with patient? Yes  SHORT TERM GOALS: Target date: 10/27/21  Pt will be independent with HEP for [facility] to increase coordination as needed for ADLs and IADLs. Baseline: Goal status: MET - reports completing HEP daily  2.  Pt will demonstrate improved UE functional use for ADLs as evidenced by increasing box/ blocks score by 3 blocks with LUE Baseline: Right 25 blocks, Left 13 blocks Goal status: MET - 18 blocks on 10/17/21  3.  Pt will demonstrate ability to retrieve a lightweight object with BUE at  moderate height range to demonstrate increased UE strength as needed to complete ADLs and IADLs.  Baseline: R shoulder 130* and L shoulder 124* Goal status: MET - L:138* and R: 142* on 10/26/21    LONG TERM GOALS: Target date: 11/24/21  Pt will demonstrate improved UE functional use for ADLs as evidenced by increasing box/ blocks score by 6 blocks with LUE Baseline: Right 25 blocks, Left 13 blocks Goal status: IN PROGRESS  2.  Pt will demonstrate improved ease with feeding as evidenced by decreasing PPT#2 (self feeding) by 3 secs Baseline: PPT #2: 24.72 Goal status: IN PROGRESS  3.  Pt will verbalize understanding of strategies, safety consideration, and AE needs to ensure safety due to decreased sensation.  Baseline:  Goal status: IN PROGRESS  4.  Pt will verbalize improved ROM and motor control with ability to complete hygiene post toileting  Baseline:  Goal status: IN PROGRESS   ASSESSMENT:  CLINICAL IMPRESSION: Treatment session with focus on visual attention to UE during coordination activities self-feeding. Pt is demonstrating improvements in functional reach and FMC/GMC coordination as evidenced by progress towards STGs.  Pt continues to demonstrate greater impairment L > R. Pt continues to demonstrate decreased incorporation of index finger on L into grasp and coordination tasks, therefore focused remainder of session on thumb opposition, picking up items with index finger, and isolated finger movements.  PERFORMANCE DEFICITS in functional skills including ADLs, IADLs, coordination, dexterity, proprioception, sensation, ROM, strength, pain, flexibility, FMC, GMC, mobility, balance, body mechanics, endurance, cardiopulmonary status limiting function, decreased knowledge of use of DME, skin integrity, and UE functional use.  IMPAIRMENTS are limiting patient from ADLs and IADLs.   COMORBIDITIES may have co-morbidities  that affects occupational performance. Patient will benefit  from skilled OT to address above impairments and improve overall function.  MODIFICATION OR ASSISTANCE TO COMPLETE EVALUATION: Min-Moderate modification of tasks or assist with assess necessary to complete an evaluation.  OT OCCUPATIONAL PROFILE AND HISTORY: Detailed assessment: Review of records and additional review of physical, cognitive, psychosocial history related to current functional performance.  CLINICAL DECISION MAKING: Moderate - several treatment options, min-mod task modification necessary  REHAB POTENTIAL: Good  EVALUATION COMPLEXITY: Moderate    PLAN: OT FREQUENCY: 1-2x/week 2x/week initially and may decrease to 1x/week pending progress  OT DURATION: 8 weeks  PLANNED INTERVENTIONS: self care/ADL training, therapeutic exercise, therapeutic activity, neuromuscular re-education, manual therapy, passive range of motion, balance training, functional mobility training, paraffin, fluidotherapy, moist heat, patient/family education, energy conservation, and DME and/or AE instructions  RECOMMENDED OTHER SERVICES: NA  CONSULTED AND AGREED WITH PLAN OF CARE: Patient and family member/caregiver  PLAN FOR NEXT SESSION: review coordination exercises and add as appropriate, review strengthening HEP/theraputty HEP. Engage in functional grasp and increasing reach.   Simonne Come, OTR/L 10/31/2021, 9:38 AM

## 2021-11-01 NOTE — Progress Notes (Signed)
Remote ICD transmission.   

## 2021-11-07 ENCOUNTER — Ambulatory Visit: Payer: Medicare HMO | Admitting: Occupational Therapy

## 2021-11-07 DIAGNOSIS — R278 Other lack of coordination: Secondary | ICD-10-CM | POA: Diagnosis not present

## 2021-11-07 DIAGNOSIS — M6281 Muscle weakness (generalized): Secondary | ICD-10-CM | POA: Diagnosis not present

## 2021-11-07 DIAGNOSIS — R208 Other disturbances of skin sensation: Secondary | ICD-10-CM | POA: Diagnosis not present

## 2021-11-07 DIAGNOSIS — R2681 Unsteadiness on feet: Secondary | ICD-10-CM | POA: Diagnosis not present

## 2021-11-07 NOTE — Therapy (Signed)
OUTPATIENT OCCUPATIONAL THERAPY Treatment Session Patient Name: Jon Williams MRN: [id number] DOB:04-30-1950, 71 y.o., male Today's Date: 11/07/2021  PCP: [name], MD REFERRING PROVIDER: Debbe Odea, MD      OT End of Session - 11/07/21 1013     Visit Number 11    Number of Visits 13    Date for OT Re-Evaluation 11/24/21    Authorization Type [employer]    OT Start Time 0935    OT Stop Time 2725    OT Time Calculation (min) 40 min                      Past Medical History:  Diagnosis Date   AICD (automatic cardioverter/defibrillator) present 05/25/2016   biv icd   Bunion, right    Chronic combined systolic and diastolic CHF (congestive heart failure) ([place])    Echo 1/18: Mild conc LVH, EF 15-20, severe diff HK, inf and inf-septal AK, Gr 3 DD, mild to mod MR, severe LAE, mod reduced RVSF, mod RAE, mild TR, PASP 50   Coronary artery disease involving native coronary artery without angina pectoris 04/17/2016   LHC 1/18: pLCx 30, mLCx 20, mRCA 40, dRCA 20, LVEDP 23, mean RA 8, PA 42/20, PCWP 17   Diabetes mellitus without complication (HCC)    DJD (degenerative joint disease)    Gastric cancer ([place])    History of atrial fibrillation    History of atrial flutter    History of cardiomegaly 06/07/2016   Noted on CXR   History of colon polyps 06/28/2017   Noted on colonoscopy   LBBB (left bundle branch block)    NICM (nonischemic cardiomyopathy) ([place])    Echo 1/18:  Mild conc LVH, EF 15-20, severe diff HK, inf and inf-septal AK, Gr 3 DD, mild to mod MR, severe LAE, mod reduced RVSF, mod RAE, mild TR, PASP 50   OA (osteoarthritis)    knee   Other secondary pulmonary hypertension ([place]) 04/17/2016   Prostate cancer ([place]) 2019   Sigmoid diverticulosis 06/28/2017   Noted on colonoscopy   Past Surgical History:  Procedure Laterality Date   BIOPSY  12/01/2020   Procedure: BIOPSY;  Surgeon: [name], MD;  Location: [facility];  Service:  Endoscopy;;   BIOPSY  06/09/2021   Procedure: BIOPSY;  Surgeon: [name], MD;  Location: [facility];  Service: Gastroenterology;;   BIV ICD INSERTION CRT-D N/A 05/25/2016   Procedure: BiV ICD Insertion CRT-D;  Surgeon: [name], MD;  Location: [facility];  Service: Cardiovascular;  Laterality: N/A;   CARDIAC CATHETERIZATION N/A 03/02/2016   Procedure: Right/Left Heart Cath and Coronary Angiography;  Surgeon: Nelva Bush, MD;  Location: [facility];  Service: Cardiovascular;  Laterality: N/A;   CARDIOVERSION N/A 07/17/2016   Procedure: Cardioversion;  Surgeon: [name], MD;  Location: [facility];  Service: Cardiovascular;  Laterality: N/A;   COLONOSCOPY WITH PROPOFOL N/A 06/28/2017   Procedure: COLONOSCOPY WITH PROPOFOL;  Surgeon: [name], MD;  Location: [facility];  Service: Endoscopy;  Laterality: N/A;   colonscopy  2009   ESOPHAGOGASTRODUODENOSCOPY N/A 12/01/2020   Procedure: ESOPHAGOGASTRODUODENOSCOPY (EGD);  Surgeon: [name], MD;  Location: [facility];  Service: Endoscopy;  Laterality: N/A;  IDA/guaiac positive stools   ESOPHAGOGASTRODUODENOSCOPY (EGD) WITH PROPOFOL N/A 12/16/2020   Procedure: ESOPHAGOGASTRODUODENOSCOPY (EGD) WITH PROPOFOL;  Surgeon: [name], MD;  [facility];  Service: Endoscopy;  Laterality: N/A;   ESOPHAGOGASTRODUODENOSCOPY (EGD) WITH PROPOFOL N/A  06/09/2021   Procedure: ESOPHAGOGASTRODUODENOSCOPY (EGD) WITH PROPOFOL;  Surgeon: [name], MD;  [facility];  Service: Gastroenterology;  Laterality: N/A;   GOLD SEED IMPLANT N/A 12/24/2017   Procedure: GOLD SEED IMPLANT, TRANSERINEAL;  Surgeon: Festus Aloe, MD;  Location: [facility];  Service: Urology;  Laterality: N/A;   INSERT / REPLACE / REMOVE PACEMAKER     LEAD REVISION  10/10/2018   LEAD REVISION/REPAIR N/A 10/10/2018   Procedure: LEAD REVISION/REPAIR;  Surgeon: [name], MD;  Location: [facility];  Service:  Cardiovascular;  Laterality: N/A;   POLYPECTOMY  06/28/2017   Procedure: POLYPECTOMY;  Surgeon: [name], MD;  [facility];  Service: Endoscopy;;  ascending and descending colon polyp   PORTACATH PLACEMENT Right 12/23/2020   Procedure: INSERTION PORT-A-CATH;  Surgeon: [name], MD;  Location: [facility];  Service: General;  Laterality: Right;   PROSTATE BIOPSY  02/20/2017   SPACE OAR INSTILLATION N/A 12/24/2017   Procedure: SPACE OAR INSTILLATION;  Surgeon: Festus Aloe, MD;  [facility];  Service: Urology;  Laterality: N/A;   TOTAL KNEE ARTHROPLASTY Right 08/16/2017   Procedure: RIGHT TOTAL KNEE ARTHROPLASTY;  Surgeon: [name], MD;  Location: [facility];  Service: Orthopedics;  Laterality: Right;   UPPER ESOPHAGEAL ENDOSCOPIC ULTRASOUND (EUS) N/A 12/16/2020   Procedure: UPPER ESOPHAGEAL ENDOSCOPIC ULTRASOUND (EUS);  Surgeon: [name], MD;  Location: [facility];  Service: Endoscopy;  Laterality: N/A;   Patient Active Problem List   Diagnosis Date Noted   Malnutrition of moderate degree 09/15/2021   DM (diabetes mellitus), type 2 ([place]) 09/14/2021   Essential hypertension 09/13/2021   Dyslipidemia 09/13/2021   DNR (do not resuscitate) 09/13/2021   Port-A-Cath in place 06/15/2021   Hypotension due to hypovolemia 04/04/2021   Nausea vomiting and diarrhea 04/04/2021   Thrombocytopenia ([place]) 04/04/2021   Leukocytosis 04/04/2021   Acute renal failure superimposed on stage 3a chronic kidney disease ([place]) 04/04/2021   Diarrhea 04/01/2021   Occult blood in stools 04/01/2021   Foot ulcer, right ([place]) 04/01/2021   Hypotension 04/01/2021   Dehydration 04/01/2021   Iron deficiency anemia due to chronic blood loss 12/12/2020   Gastric cancer ([place]) 12/08/2020   Symptomatic anemia 11/30/2020   Gastrointestinal hemorrhage    Corns and callosities 08/07/2019   Blood clotting disorder ([place]) 05/06/2019   Coagulation defect ([place]) 05/06/2019   Pacemaker lead  malfunction 10/10/2018   Pacemaker complications 65/46/5035   Pacemaker lead failure, sequela 10/10/2018   S/P knee replacement 08/16/2017   S/P total knee arthroplasty 08/16/2017   Malignant neoplasm of prostate ([place]) 04/02/2017   ICD (implantable cardioverter-defibrillator) in place    Persistent atrial fibrillation ([place]) 05/25/2016   Atrial fibrillation ([place]) 05/25/2016   Coronary artery disease involving native coronary artery without angina pectoris 04/17/2016   Other secondary pulmonary hypertension ([place]) 04/17/2016   Acute on chronic combined systolic and diastolic CHF (congestive heart failure) (HCC)    NICM (nonischemic cardiomyopathy) ([place])    LBBB (left bundle branch block)     ONSET DATE: noted onset of increased symptoms and loss of function during chemo around February 2023 (REFERRAL Date: 09/15/21)  REFERRING DIAG: I50.9 (ICD-10-CM) - CHF (congestive heart failure)  THERAPY DIAG:  Other lack of coordination  Muscle weakness (generalized)  Other disturbances of skin sensation  Rationale for Evaluation and Treatment Rehabilitation  SUBJECTIVE:   SUBJECTIVE STATEMENT: Pt reports improved walking over the week/weekend.  Even going out to the store. Pt accompanied by: self and significant  other ([place])  PERTINENT HISTORY: Had a recent bout of chemo for his gastric tumor, ICD, CAD, a-fib, gastric CA, GI bleed, CHF, hypotension, LVEF 20-25%,   PRECAUTIONS: Fall  WEIGHT BEARING RESTRICTIONS No  PAIN:  Are you having pain? Yes: NPRS scale: 4/10 Pain location: fingers Pain description: stiffness, pins and needles Aggravating factors: [place] tasks Relieving factors: rest   PATIENT GOALS to become independent again  OBJECTIVE:   HAND DOMINANCE: Right  ADLs: Eating: decreased control of utensil, is currently using built up handles Grooming: Mod I UB Dressing: typically wears elastic waist and pull over shirts LB Dressing: wife assists with setup of items, pt  requires increased time, now wearing elastic waist pants and slip on shoes Toileting: Mod I, requires increased time for hygiene post toileting Bathing: Setup for bathing with wife applying wash mitts and adjusting water, pt able to wash body Tub Shower transfers: tub/shower transfer with supervision from spouse, has tub transfer bench and grab bars Equipment: Transfer tub bench, Grab bars, and bed side commode  FUNCTIONAL OUTCOME MEASURES: Physical performance test: PPT #2: 24.72* 10/26/21: PPT #2: 22.16 sec 10/31/21: PPT #2: 17.62 sec  UPPER EXTREMITY ROM     Active ROM Right eval Right 10/26/21 Left eval Left 10/26/21  Shoulder flexion 130 142 126 138  Shoulder abduction      Shoulder adduction      Shoulder extension      Shoulder internal rotation      Shoulder external rotation      Elbow flexion      Elbow extension      Wrist flexion      Wrist extension      Wrist ulnar deviation      Wrist radial deviation      Wrist pronation      Wrist supination      (Blank rows = not tested)   UPPER EXTREMITY MMT:     MMT Right eval Left eval  Shoulder flexion 4+/5 4+/5  Shoulder abduction    Shoulder adduction    Shoulder extension    Shoulder internal rotation    Shoulder external rotation    Middle trapezius    Lower trapezius    Elbow flexion 4+/5 4+/5  Elbow extension 4+/5 4+/5  Wrist flexion    Wrist extension    Wrist ulnar deviation    Wrist radial deviation    Wrist pronation    Wrist supination    (Blank rows = not tested)   COORDINATION: Finger Nose Finger test: slow bilaterally, required demonstration for technique Box and Blocks:  Right 25 blocks, Left 13 blocks 10/17/21 - Right: 25 blocks, Left: 18 blocks 10/31/21- Right: 27 blocks, Left: 18 blocks  SENSATION: Light touch: Impaired  Stereognosis: Impaired  Hot/Cold: Impaired   COGNITION: Overall cognitive status:  Pt required demonstration and multimodal cues throughout evaluation, question  further cognitive impairments.  VISION: Subjective report: s/p chemo pt with increased watery eyes, now wearing glasses instead of contacts Baseline vision: Wears glasses all the time --------------------------------------------------------------------------------------------------------------------------------------------------------   TODAY'S TREATMENT:  Handwriting: Utilized built up handle to attempt to facilitate increased legibility and sustained grasp on writing utensil.  Discussed purchasing weighted and/or larger grip pen due to improved legibility with standard pen that was marginally larger width and weighted.  Provided pictures of examples of pens and recommended checking out office supply area in stores for variety of pens. Coordination: picking up and placing large grip pegs into resistive peg board with focus  on precision pinch and gross grasp as needed to place pegs in pegboard to replicate pattern.  Pt requiring use of L hand to position pegs into R hand for improved grip. Pt requiring increased time and assist to pick up pegs when attempting to utilize LUE.  Pt demonstrating gross grasp on L compared to precision grasp on R and significant increase in challenge with picking up and placing pegs on L.     10/31/21 Box and Blocks: R: 27 and L: 18.  Pt continues to demonstrate difficulty with picking up blocks, especially during timed task bilaterally.  Pt with impaired coordination and sensation L > R. Coordination: Engaged in [place] of 1" blocks into stacks of 5.  Pt demonstrating improved motor control and ability to grasp blocks without timed component.  Pt dropping 2 of 20 with LUE and 0 of 20 with RUE.  Pt demonstrating decreased incorporation of index finger with grasp with L hand. Pinch grip: engaged in thumb opposition along foam roll to simulate theraputty with focus on increased rounding of pinch with opposition to increase functional grip as pt continues to demonstrate  decreased opposition and integration of index finger into grasping tasks with LUE.  OT increased challenge to attempting to pick up small alphabet pieces with L and R hand, however due to decreased rounded opposition and integration of index finger pt with decreased success.  Pt with improved success when picking up 1" blocks. Self-feeding: PPT #2: 17.62 sec.  Pt demonstrates improved success with use of built up handle.  Pt reports utilizing built up handles on utensils at home. Isolated finger mobility: Engaged in thumb opposition with focus on rounding digits to make "O" shape.  Pt demonstrating difficulty with isolate finger extension from table top, therefore educated to complete with blocking at MCP with improved success.  OT reiterated focus on quality of movements and not speed at this time.   PATIENT EDUCATION: Education details: Educated on functional carryover of grasp and reaching tasks into home making/IADLs Person educated: Patient and Spouse Education method: Customer service manager Education comprehension: verbalized understanding   HOME EXERCISE PROGRAM: Coordination exercises - See pt instructions  Access Code: [email or web address] URL: [email or web address] Date: 10/31/2021 Prepared by: [facility]  Exercises - Putty Squeezes  - 1 x daily - 1 sets - 10 reps - "Duck mouth"  - 1 x daily - 1 sets - 10 reps - Rolling Putty on Table  - 1 x daily - 1 sets - 10 reps - Thumb Opposition with Putty  - 1 x daily - 1 sets - 10 reps - Thumb AROM Opposition  - 1 x daily - 2 sets - 10 reps - Seated Finger MP Extension AROM with Blocking  - 1 x daily - 2 sets - 10 reps    GOALS: Goals reviewed with patient? Yes  SHORT TERM GOALS: Target date: 10/27/21  Pt will be independent with HEP for [facility] to increase coordination as needed for ADLs and IADLs. Baseline: Goal status: MET - reports completing HEP daily  2.  Pt will demonstrate  improved UE functional use for ADLs as evidenced by increasing box/ blocks score by 3 blocks with LUE Baseline: Right 25 blocks, Left 13 blocks Goal status: MET - 18 blocks on 10/17/21  3.  Pt will demonstrate ability to retrieve a lightweight object with BUE at moderate height range to demonstrate increased UE strength as needed to complete ADLs and IADLs.  Baseline:  R shoulder 130* and L shoulder 124* Goal status: MET - L:138* and R: 142* on 10/26/21    LONG TERM GOALS: Target date: 11/24/21  Pt will demonstrate improved UE functional use for ADLs as evidenced by increasing box/ blocks score by 6 blocks with LUE Baseline: Right 25 blocks, Left 13 blocks Goal status: IN PROGRESS  2.  Pt will demonstrate improved ease with feeding as evidenced by decreasing PPT#2 (self feeding) by 3 secs Baseline: PPT #2: 24.72 Goal status: IN PROGRESS  3.  Pt will verbalize understanding of strategies, safety consideration, and AE needs to ensure safety due to decreased sensation.  Baseline:  Goal status: IN PROGRESS  4.  Pt will verbalize improved ROM and motor control with ability to complete hygiene post toileting  Baseline:  Goal status: IN PROGRESS   ASSESSMENT:  CLINICAL IMPRESSION: Treatment session with focus on visual attention to UE during coordination activities self-feeding. Pt continues to demonstrate greater impairment L > R. Discussed adaptive writing utensils as pt responding to increased weight and size of pen.  PERFORMANCE DEFICITS in functional skills including ADLs, IADLs, coordination, dexterity, proprioception, sensation, ROM, strength, pain, flexibility, FMC, GMC, mobility, balance, body mechanics, endurance, cardiopulmonary status limiting function, decreased knowledge of use of DME, skin integrity, and UE functional use.  IMPAIRMENTS are limiting patient from ADLs and IADLs.   COMORBIDITIES may have co-morbidities  that affects occupational performance. Patient will  benefit from skilled OT to address above impairments and improve overall function.  MODIFICATION OR ASSISTANCE TO COMPLETE EVALUATION: Min-Moderate modification of tasks or assist with assess necessary to complete an evaluation.  OT OCCUPATIONAL PROFILE AND HISTORY: Detailed assessment: Review of records and additional review of physical, cognitive, psychosocial history related to current functional performance.  CLINICAL DECISION MAKING: Moderate - several treatment options, min-mod task modification necessary  REHAB POTENTIAL: Good  EVALUATION COMPLEXITY: Moderate    PLAN: OT FREQUENCY: 1-2x/week 2x/week initially and may decrease to 1x/week pending progress  OT DURATION: 8 weeks  PLANNED INTERVENTIONS: self care/ADL training, therapeutic exercise, therapeutic activity, neuromuscular re-education, manual therapy, passive range of motion, balance training, functional mobility training, paraffin, fluidotherapy, moist heat, patient/family education, energy conservation, and DME and/or AE instructions  RECOMMENDED OTHER SERVICES: NA  CONSULTED AND AGREED WITH PLAN OF CARE: Patient and family member/caregiver  PLAN FOR NEXT SESSION: review coordination exercises and add as appropriate, review strengthening HEP/theraputty HEP. Engage in functional grasp and increasing reach.   Khalon Cansler, [place], OTR/L 11/07/2021, 10:14 AM

## 2021-11-14 ENCOUNTER — Ambulatory Visit: Payer: Medicare HMO | Attending: Internal Medicine | Admitting: Occupational Therapy

## 2021-11-14 DIAGNOSIS — R278 Other lack of coordination: Secondary | ICD-10-CM | POA: Diagnosis not present

## 2021-11-14 DIAGNOSIS — R208 Other disturbances of skin sensation: Secondary | ICD-10-CM | POA: Insufficient documentation

## 2021-11-14 DIAGNOSIS — M6281 Muscle weakness (generalized): Secondary | ICD-10-CM | POA: Insufficient documentation

## 2021-11-14 DIAGNOSIS — R2681 Unsteadiness on feet: Secondary | ICD-10-CM | POA: Insufficient documentation

## 2021-11-14 NOTE — Therapy (Signed)
OUTPATIENT OCCUPATIONAL THERAPY Treatment Session Patient Name: [name] MRN: [id number] DOB:1950-03-25, 71 y.o., male Today's Date: 11/14/2021  PCP: [name], MD REFERRING PROVIDER: Debbe Odea, MD      OT End of Session - 11/14/21 1202     Visit Number 12    Number of Visits 13    Date for OT Re-Evaluation 11/24/21    Authorization Type [employer]    OT Start Time 0935    OT Stop Time 1017    OT Time Calculation (min) 42 min                       Past Medical History:  Diagnosis Date   AICD (automatic cardioverter/defibrillator) present 05/25/2016   biv icd   Bunion, right    Chronic combined systolic and diastolic CHF (congestive heart failure) ([place])    Echo 1/18: Mild conc LVH, EF 15-20, severe diff HK, inf and inf-septal AK, Gr 3 DD, mild to mod MR, severe LAE, mod reduced RVSF, mod RAE, mild TR, PASP 50   Coronary artery disease involving native coronary artery without angina pectoris 04/17/2016   LHC 1/18: pLCx 30, mLCx 20, mRCA 40, dRCA 20, LVEDP 23, mean RA 8, PA 42/20, PCWP 17   Diabetes mellitus without complication (HCC)    DJD (degenerative joint disease)    Gastric cancer ([place])    History of atrial fibrillation    History of atrial flutter    History of cardiomegaly 06/07/2016   Noted on CXR   History of colon polyps 06/28/2017   Noted on colonoscopy   LBBB (left bundle branch block)    NICM (nonischemic cardiomyopathy) ([place])    Echo 1/18:  Mild conc LVH, EF 15-20, severe diff HK, inf and inf-septal AK, Gr 3 DD, mild to mod MR, severe LAE, mod reduced RVSF, mod RAE, mild TR, PASP 50   OA (osteoarthritis)    knee   Other secondary pulmonary hypertension ([place]) 04/17/2016   Prostate cancer ([place]) 2019   Sigmoid diverticulosis 06/28/2017   Noted on colonoscopy   Past Surgical History:  Procedure Laterality Date   BIOPSY  12/01/2020   Procedure: BIOPSY;  Surgeon: [name], MD;  Location: [place] [facility];  Service:  Endoscopy;;   BIOPSY  06/09/2021   Procedure: BIOPSY;  Surgeon: [name], MD;  Location: [facility];  Service: Gastroenterology;;   BIV ICD INSERTION CRT-D N/A 05/25/2016   Procedure: BiV ICD Insertion CRT-D;  Surgeon: [name], MD;  Location: [facility];  Service: Cardiovascular;  Laterality: N/A;   CARDIAC CATHETERIZATION N/A 03/02/2016   Procedure: Right/Left Heart Cath and Coronary Angiography;  Surgeon: Nelva Bush, MD;  Location: [facility];  Service: Cardiovascular;  Laterality: N/A;   CARDIOVERSION N/A 07/17/2016   Procedure: Cardioversion;  Surgeon: [name], MD;  Location: [place] [facility];  Service: Cardiovascular;  Laterality: N/A;   COLONOSCOPY WITH PROPOFOL N/A 06/28/2017   Procedure: COLONOSCOPY WITH PROPOFOL;  Surgeon: [name], MD;  [facility]: [facility];  Service: Endoscopy;  Laterality: N/A;   colonscopy  2009   ESOPHAGOGASTRODUODENOSCOPY N/A 12/01/2020   Procedure: ESOPHAGOGASTRODUODENOSCOPY (EGD);  Surgeon: [name], MD;  Location: [facility];  Service: Endoscopy;  Laterality: N/A;  IDA/guaiac positive stools   ESOPHAGOGASTRODUODENOSCOPY (EGD) WITH PROPOFOL N/A 12/16/2020   Procedure: ESOPHAGOGASTRODUODENOSCOPY (EGD) WITH PROPOFOL;  Surgeon: [name], MD;  [facility];  Service: Endoscopy;  Laterality: N/A;   ESOPHAGOGASTRODUODENOSCOPY (EGD) WITH PROPOFOL  N/A 06/09/2021   Procedure: ESOPHAGOGASTRODUODENOSCOPY (EGD) WITH PROPOFOL;  Surgeon: [name], MD;  [facility];  Service: Gastroenterology;  Laterality: N/A;   GOLD SEED IMPLANT N/A 12/24/2017   Procedure: GOLD SEED IMPLANT, TRANSERINEAL;  Surgeon: Festus Aloe, MD;  [facility]: [facility];  Service: Urology;  Laterality: N/A;   INSERT / REPLACE / REMOVE PACEMAKER     LEAD REVISION  10/10/2018   LEAD REVISION/REPAIR N/A 10/10/2018   Procedure: LEAD REVISION/REPAIR;  Surgeon: [name], MD;  Location: [facility];  Service:  Cardiovascular;  Laterality: N/A;   POLYPECTOMY  06/28/2017   Procedure: POLYPECTOMY;  Surgeon: [name], MD;  [facility];  Service: Endoscopy;;  ascending and descending colon polyp   PORTACATH PLACEMENT Right 12/23/2020   Procedure: INSERTION PORT-A-CATH;  Surgeon: [name], MD;  Location: [facility];  Service: General;  Laterality: Right;   PROSTATE BIOPSY  02/20/2017   SPACE OAR INSTILLATION N/A 12/24/2017   Procedure: SPACE OAR INSTILLATION;  Surgeon: Festus Aloe, MD;  [facility];  Service: Urology;  Laterality: N/A;   TOTAL KNEE ARTHROPLASTY Right 08/16/2017   Procedure: RIGHT TOTAL KNEE ARTHROPLASTY;  Surgeon: [name], MD;  Location: [facility];  Service: Orthopedics;  Laterality: Right;   UPPER ESOPHAGEAL ENDOSCOPIC ULTRASOUND (EUS) N/A 12/16/2020   Procedure: UPPER ESOPHAGEAL ENDOSCOPIC ULTRASOUND (EUS);  Surgeon: [name], MD;  Location: [facility];  Service: Endoscopy;  Laterality: N/A;   Patient Active Problem List   Diagnosis Date Noted   Malnutrition of moderate degree 09/15/2021   DM (diabetes mellitus), type 2 ([place]) 09/14/2021   Essential hypertension 09/13/2021   Dyslipidemia 09/13/2021   DNR (do not resuscitate) 09/13/2021   Port-A-Cath in place 06/15/2021   Hypotension due to hypovolemia 04/04/2021   Nausea vomiting and diarrhea 04/04/2021   Thrombocytopenia ([place]) 04/04/2021   Leukocytosis 04/04/2021   Acute renal failure superimposed on stage 3a chronic kidney disease ([place]) 04/04/2021   Diarrhea 04/01/2021   Occult blood in stools 04/01/2021   Foot ulcer, right ([place]) 04/01/2021   Hypotension 04/01/2021   Dehydration 04/01/2021   Iron deficiency anemia due to chronic blood loss 12/12/2020   Gastric cancer ([place]) 12/08/2020   Symptomatic anemia 11/30/2020   Gastrointestinal hemorrhage    Corns and callosities 08/07/2019   Blood clotting disorder ([place]) 05/06/2019   Coagulation defect ([place]) 05/06/2019   Pacemaker lead  malfunction 10/10/2018   Pacemaker complications 03/50/0938   Pacemaker lead failure, sequela 10/10/2018   S/P knee replacement 08/16/2017   S/P total knee arthroplasty 08/16/2017   Malignant neoplasm of prostate ([place]) 04/02/2017   ICD (implantable cardioverter-defibrillator) in place    Persistent atrial fibrillation ([place]) 05/25/2016   Atrial fibrillation ([place]) 05/25/2016   Coronary artery disease involving native coronary artery without angina pectoris 04/17/2016   Other secondary pulmonary hypertension ([place]) 04/17/2016   Acute on chronic combined systolic and diastolic CHF (congestive heart failure) (HCC)    NICM (nonischemic cardiomyopathy) ([place])    LBBB (left bundle branch block)     ONSET DATE: noted onset of increased symptoms and loss of function during chemo around February 2023 (REFERRAL Date: 09/15/21)  REFERRING DIAG: I50.9 (ICD-10-CM) - CHF (congestive heart failure)  THERAPY DIAG:  Other lack of coordination  Muscle weakness (generalized)  Other disturbances of skin sensation  Unsteadiness on feet  Rationale for Evaluation and Treatment Rehabilitation  SUBJECTIVE:   SUBJECTIVE STATEMENT: Pt reports doing a lot with his hand yesterday. Pt reports turning off alarm clock  with R hand this morning. Pt accompanied by: self and significant other ([place])  PERTINENT HISTORY: Had a recent bout of chemo for his gastric tumor, ICD, CAD, a-fib, gastric CA, GI bleed, CHF, hypotension, LVEF 20-25%,   PRECAUTIONS: Fall  WEIGHT BEARING RESTRICTIONS No  PAIN:  Are you having pain? Yes: NPRS scale: 4/10 Pain location: fingers Pain description: stiffness, pins and needles Aggravating factors: [place] tasks Relieving factors: rest   PATIENT GOALS to become independent again  OBJECTIVE:   HAND DOMINANCE: Right  ADLs: Eating: decreased control of utensil, is currently using built up handles Grooming: Mod I UB Dressing: typically wears elastic waist and pull over  shirts LB Dressing: wife assists with setup of items, pt requires increased time, now wearing elastic waist pants and slip on shoes Toileting: Mod I, requires increased time for hygiene post toileting Bathing: Setup for bathing with wife applying wash mitts and adjusting water, pt able to wash body Tub Shower transfers: tub/shower transfer with supervision from spouse, has tub transfer bench and grab bars Equipment: Transfer tub bench, Grab bars, and bed side commode  FUNCTIONAL OUTCOME MEASURES: Physical performance test: PPT #2: 24.72* 10/26/21: PPT #2: 22.16 sec 10/31/21: PPT #2: 17.62 sec  UPPER EXTREMITY ROM     Active ROM Right eval Right 10/26/21 Left eval Left 10/26/21  Shoulder flexion 130 142 126 138  Shoulder abduction      Shoulder adduction      Shoulder extension      Shoulder internal rotation      Shoulder external rotation      Elbow flexion      Elbow extension      Wrist flexion      Wrist extension      Wrist ulnar deviation      Wrist radial deviation      Wrist pronation      Wrist supination      (Blank rows = not tested)   UPPER EXTREMITY MMT:     MMT Right eval Left eval  Shoulder flexion 4+/5 4+/5  Shoulder abduction    Shoulder adduction    Shoulder extension    Shoulder internal rotation    Shoulder external rotation    Middle trapezius    Lower trapezius    Elbow flexion 4+/5 4+/5  Elbow extension 4+/5 4+/5  Wrist flexion    Wrist extension    Wrist ulnar deviation    Wrist radial deviation    Wrist pronation    Wrist supination    (Blank rows = not tested)   COORDINATION: Finger Nose Finger test: slow bilaterally, required demonstration for technique Box and Blocks:  Right 25 blocks, Left 13 blocks 10/17/21 - Right: 25 blocks, Left: 18 blocks 10/31/21- Right: 27 blocks, Left: 18 blocks  SENSATION: Light touch: Impaired  Stereognosis: Impaired  Hot/Cold: Impaired   COGNITION: Overall cognitive status:  Pt required  demonstration and multimodal cues throughout evaluation, question further cognitive impairments.  VISION: Subjective report: s/p chemo pt with increased watery eyes, now wearing glasses instead of contacts Baseline vision: Wears glasses all the time --------------------------------------------------------------------------------------------------------------------------------------------------------   TODAY'S TREATMENT: 11/14/21 Coordination: picking up small stones with RUE and LUE and crossing midline to place in cup.  Focus on thumb opposition and precision pinch.  Therapist placed towel under stones to provide increased friction to facilitate increased success with picking up smooth stones.  Pt requiring increased time, however demonstrating improved opposition and ability to complete bilaterally.   ADL: discussed transition  from tub/shower to walk-in shower.  OT educated on DME for walk-in shower to allow for improved ease of transition as pt still sitting for majority of shower.  Discussed possibility of attempting to increase standing bouts during shower in tub/shower with tub bench for sitting as needed.  Pt and wife in agreement with plan to attempt to increase standing during shower and assess current DME to see if appropriate fit into walk-in shower. Coordination: placing stones on top of pegs with focus on increased motor control and coordination.  Increased challenge to incorporate standing to progress towards standing for functional tasks, to include shower.  Pt tolerated standing 5 mins prior to requiring seated rest break.   11/07/21 Handwriting: Utilized built up handle to attempt to facilitate increased legibility and sustained grasp on writing utensil.  Discussed purchasing weighted and/or larger grip pen due to improved legibility with standard pen that was marginally larger width and weighted.  Provided pictures of examples of pens and recommended checking out office supply area in  stores for variety of pens. Coordination: picking up and placing large grip pegs into resistive peg board with focus on precision pinch and gross grasp as needed to place pegs in pegboard to replicate pattern.  Pt requiring use of L hand to position pegs into R hand for improved grip. Pt requiring increased time and assist to pick up pegs when attempting to utilize LUE.  Pt demonstrating gross grasp on L compared to precision grasp on R and significant increase in challenge with picking up and placing pegs on L.    10/31/21 Box and Blocks: R: 27 and L: 18.  Pt continues to demonstrate difficulty with picking up blocks, especially during timed task bilaterally.  Pt with impaired coordination and sensation L > R. Coordination: Engaged in [place] of 1" blocks into stacks of 5.  Pt demonstrating improved motor control and ability to grasp blocks without timed component.  Pt dropping 2 of 20 with LUE and 0 of 20 with RUE.  Pt demonstrating decreased incorporation of index finger with grasp with L hand. Pinch grip: engaged in thumb opposition along foam roll to simulate theraputty with focus on increased rounding of pinch with opposition to increase functional grip as pt continues to demonstrate decreased opposition and integration of index finger into grasping tasks with LUE.  OT increased challenge to attempting to pick up small alphabet pieces with L and R hand, however due to decreased rounded opposition and integration of index finger pt with decreased success.  Pt with improved success when picking up 1" blocks. Self-feeding: PPT #2: 17.62 sec.  Pt demonstrates improved success with use of built up handle.  Pt reports utilizing built up handles on utensils at home. Isolated finger mobility: Engaged in thumb opposition with focus on rounding digits to make "O" shape.  Pt demonstrating difficulty with isolate finger extension from table top, therefore educated to complete with blocking at MCP with improved  success.  OT reiterated focus on quality of movements and not speed at this time.   PATIENT EDUCATION: Education details: Educated on functional carryover of grasp and reaching tasks into home making/IADLs.  Discussed DME and progression to transitioning from tub/shower to walk-in shower. Person educated: Patient and Spouse Education method: Customer service manager Education comprehension: verbalized understanding   HOME EXERCISE PROGRAM: Coordination exercises - See pt instructions  Access Code: [email or web address] URL: [email or web address] Date: 10/31/2021 Prepared by: [facility]  Exercises - Putty Squeezes  -  1 x daily - 1 sets - 10 reps - "Duck mouth"  - 1 x daily - 1 sets - 10 reps - Rolling Putty on Table  - 1 x daily - 1 sets - 10 reps - Thumb Opposition with Putty  - 1 x daily - 1 sets - 10 reps - Thumb AROM Opposition  - 1 x daily - 2 sets - 10 reps - Seated Finger MP Extension AROM with Blocking  - 1 x daily - 2 sets - 10 reps    GOALS: Goals reviewed with patient? Yes  SHORT TERM GOALS: Target date: 10/27/21  Pt will be independent with HEP for [facility] to increase coordination as needed for ADLs and IADLs. Baseline: Goal status: MET - reports completing HEP daily  2.  Pt will demonstrate improved UE functional use for ADLs as evidenced by increasing box/ blocks score by 3 blocks with LUE Baseline: Right 25 blocks, Left 13 blocks Goal status: MET - 18 blocks on 10/17/21  3.  Pt will demonstrate ability to retrieve a lightweight object with BUE at moderate height range to demonstrate increased UE strength as needed to complete ADLs and IADLs.  Baseline: R shoulder 130* and L shoulder 124* Goal status: MET - L:138* and R: 142* on 10/26/21    LONG TERM GOALS: Target date: 11/24/21  Pt will demonstrate improved UE functional use for ADLs as evidenced by increasing box/ blocks score by 6 blocks with LUE Baseline: Right 25  blocks, Left 13 blocks Goal status: IN PROGRESS  2.  Pt will demonstrate improved ease with feeding as evidenced by decreasing PPT#2 (self feeding) by 3 secs Baseline: PPT #2: 24.72 Goal status: IN PROGRESS  3.  Pt will verbalize understanding of strategies, safety consideration, and AE needs to ensure safety due to decreased sensation.  Baseline:  Goal status: IN PROGRESS  4.  Pt will verbalize improved ROM and motor control with ability to complete hygiene post toileting  Baseline:  Goal status: IN PROGRESS   ASSESSMENT:  CLINICAL IMPRESSION: Treatment session with focus on visual attention to UE during coordination activities self-feeding. Pt continues to demonstrate greater impairment L > R. Discussed DME for progress from tub/shower to walk-in shower as well as progressing standing tolerance in shower and during functional tasks.  Discussed upcoming end of cert and encouraged pt and spouse to assess current status and come to next session prepared to discuss d/c vs hold on therapy vs continuing therapy.  PERFORMANCE DEFICITS in functional skills including ADLs, IADLs, coordination, dexterity, proprioception, sensation, ROM, strength, pain, flexibility, FMC, GMC, mobility, balance, body mechanics, endurance, cardiopulmonary status limiting function, decreased knowledge of use of DME, skin integrity, and UE functional use.  IMPAIRMENTS are limiting patient from ADLs and IADLs.   COMORBIDITIES may have co-morbidities  that affects occupational performance. Patient will benefit from skilled OT to address above impairments and improve overall function.  MODIFICATION OR ASSISTANCE TO COMPLETE EVALUATION: Min-Moderate modification of tasks or assist with assess necessary to complete an evaluation.  OT OCCUPATIONAL PROFILE AND HISTORY: Detailed assessment: Review of records and additional review of physical, cognitive, psychosocial history related to current functional  performance.  CLINICAL DECISION MAKING: Moderate - several treatment options, min-mod task modification necessary  REHAB POTENTIAL: Good  EVALUATION COMPLEXITY: Moderate    PLAN: OT FREQUENCY: 1-2x/week 2x/week initially and may decrease to 1x/week pending progress  OT DURATION: 8 weeks  PLANNED INTERVENTIONS: self care/ADL training, therapeutic exercise, therapeutic activity, neuromuscular re-education, manual therapy, passive  range of motion, balance training, functional mobility training, paraffin, fluidotherapy, moist heat, patient/family education, energy conservation, and DME and/or AE instructions  RECOMMENDED OTHER SERVICES: NA  CONSULTED AND AGREED WITH PLAN OF CARE: Patient and family member/caregiver  PLAN FOR NEXT SESSION: review coordination exercises and add as appropriate, review strengthening HEP/theraputty HEP. Engage in functional grasp and increasing reach.  May d/c pending progress and current status vs re-cert for additional visits.   Simonne Come, OTR/L 11/14/2021, 12:02 PM

## 2021-11-16 ENCOUNTER — Encounter: Payer: Self-pay | Admitting: Hematology

## 2021-11-16 ENCOUNTER — Other Ambulatory Visit: Payer: Self-pay

## 2021-11-16 ENCOUNTER — Inpatient Hospital Stay: Payer: Medicare HMO | Attending: Hematology

## 2021-11-16 ENCOUNTER — Inpatient Hospital Stay: Payer: Medicare HMO | Admitting: Hematology

## 2021-11-16 ENCOUNTER — Inpatient Hospital Stay: Payer: Medicare HMO

## 2021-11-16 VITALS — BP 98/76 | HR 63 | Temp 97.4°F | Resp 18 | Ht 66.0 in | Wt 189.3 lb

## 2021-11-16 DIAGNOSIS — Z95828 Presence of other vascular implants and grafts: Secondary | ICD-10-CM

## 2021-11-16 DIAGNOSIS — G620 Drug-induced polyneuropathy: Secondary | ICD-10-CM | POA: Insufficient documentation

## 2021-11-16 DIAGNOSIS — C169 Malignant neoplasm of stomach, unspecified: Secondary | ICD-10-CM | POA: Insufficient documentation

## 2021-11-16 DIAGNOSIS — C163 Malignant neoplasm of pyloric antrum: Secondary | ICD-10-CM

## 2021-11-16 DIAGNOSIS — D500 Iron deficiency anemia secondary to blood loss (chronic): Secondary | ICD-10-CM

## 2021-11-16 DIAGNOSIS — C61 Malignant neoplasm of prostate: Secondary | ICD-10-CM | POA: Diagnosis not present

## 2021-11-16 LAB — CBC WITH DIFFERENTIAL/PLATELET
Abs Immature Granulocytes: 0.01 10*3/uL (ref 0.00–0.07)
Basophils Absolute: 0 10*3/uL (ref 0.0–0.1)
Basophils Relative: 0 %
Eosinophils Absolute: 0.1 10*3/uL (ref 0.0–0.5)
Eosinophils Relative: 2 %
HCT: 42 % (ref 39.0–52.0)
Hemoglobin: 14.2 g/dL (ref 13.0–17.0)
Immature Granulocytes: 0 %
Lymphocytes Relative: 21 %
Lymphs Abs: 1 10*3/uL (ref 0.7–4.0)
MCH: 32.9 pg (ref 26.0–34.0)
MCHC: 33.8 g/dL (ref 30.0–36.0)
MCV: 97.4 fL (ref 80.0–100.0)
Monocytes Absolute: 0.4 10*3/uL (ref 0.1–1.0)
Monocytes Relative: 9 %
Neutro Abs: 3.1 10*3/uL (ref 1.7–7.7)
Neutrophils Relative %: 68 %
Platelets: 161 10*3/uL (ref 150–400)
RBC: 4.31 MIL/uL (ref 4.22–5.81)
RDW: 13.8 % (ref 11.5–15.5)
WBC: 4.6 10*3/uL (ref 4.0–10.5)
nRBC: 0 % (ref 0.0–0.2)

## 2021-11-16 LAB — COMPREHENSIVE METABOLIC PANEL
ALT: 24 U/L (ref 0–44)
AST: 22 U/L (ref 15–41)
Albumin: 4.6 g/dL (ref 3.5–5.0)
Alkaline Phosphatase: 49 U/L (ref 38–126)
Anion gap: 7 (ref 5–15)
BUN: 19 mg/dL (ref 8–23)
CO2: 26 mmol/L (ref 22–32)
Calcium: 9.3 mg/dL (ref 8.9–10.3)
Chloride: 106 mmol/L (ref 98–111)
Creatinine, Ser: 1.11 mg/dL (ref 0.61–1.24)
GFR, Estimated: >60 mL/min (ref >60)
Glucose, Bld: 126 mg/dL — ABNORMAL HIGH (ref 70–99)
Potassium: 4 mmol/L (ref 3.5–5.1)
Sodium: 139 mmol/L (ref 135–145)
Total Bilirubin: 0.9 mg/dL (ref 0.3–1.2)
Total Protein: 7.4 g/dL (ref 6.5–8.1)

## 2021-11-16 LAB — FERRITIN: Ferritin: 43 ng/mL (ref 24–336)

## 2021-11-16 MED ORDER — HEPARIN SOD (PORK) LOCK FLUSH 100 UNIT/ML IV SOLN
500.0000 [IU] | Freq: Once | INTRAVENOUS | Status: AC
  Administered 2021-11-16: 500 [IU]

## 2021-11-16 MED ORDER — SODIUM CHLORIDE 0.9% FLUSH
10.0000 mL | Freq: Once | INTRAVENOUS | Status: AC
  Administered 2021-11-16: 10 mL

## 2021-11-16 NOTE — Progress Notes (Signed)
[place]   [phone] [phone]   Clinic Follow up Note   Patient Care Team: [name], MD as PCP - General (Internal Medicine) [name], [name], MD as PCP - Cardiology (Cardiology) [name], MD as PCP - Electrophysiology (Cardiology) [name], MD as Consulting Physician (General Surgery) Truitt Merle, MD as Consulting Physician (Hematology) [name], MD as Consulting Physician (Gastroenterology)  Date of Service:  11/16/2021  CHIEF COMPLAINT: f/u of gastric cancer  CURRENT THERAPY:  Surveillance  ASSESSMENT & PLAN:  Jon Williams is a 71 y.o. male with   1. Gastric Cancer, cT3N0M0, stage II, MMR deficient, HER2 2+/FISH-, PD-L1 20% -initially referred to ED by PCP on 11/30/20 for severe anemia hgb 7.4. He was also recently treated for H-pylori gastritis. EGD on 12/01/20 by Dr. Benson Norway showed a tumor at the incisura, which pathology confirmed was adenocarcinoma.  -CT CAP 12/01/20 showed no metastatic disease. -He underwent echo on 10/26 showing reduced EF and severe LV dysfunction. Per Dr. Lovena Le, surgery is recommended only if it is curative given his moderate risk. -EUS on 12/16/20 under Dr. Benson Norway, staging T3 N0. -IHC returned abnormal, showing loss of expression in MLH1 and PMS2. Which predict good response to immunotherapy Beryle Flock  -he started FLOT4 on 01/02/21, developed worsening neuropathy and diarrhea after cycle 6, required hospitalization. We have discontinued chemo treatment.  -felt to not be a candidate for surgery due to poor PS -surveillance endoscopy on 06/09/21 by Dr. Benson Norway showed marked improvement, with only remaining erythema and a central scar. Biopsy of the scar was negative for malignancy.  -surveillance CT CAP on 09/11/21 showed NED. Plan to repeat in 6 months. -he is clinically improving. Labs reviewed, all WNL. He has not had his port flushed since his scan. We will flush today, and I encouraged him to  return in 2 months.   2.  Right foot deformity, wound  -developed 02/2021, healing delayed secondary to chemo -almost healed, now scabbed and no longer painful   3.  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   SUMMARY OF ONCOLOGIC HISTORY: Oncology History Overview Note  Cancer Staging Gastric cancer [facility]) Staging form: Stomach, AJCC 8th Edition - Clinical stage from 12/01/2020: Stage IIB (cT3, cN0, cM0) - Signed by Truitt Merle, MD on 12/20/2020 Stage prefix: Initial diagnosis Total positive nodes: 0  Malignant neoplasm of prostate ([place]) Staging form: Prostate, AJCC 8th Edition - Clinical: Stage IIC (cT2b, cN0, cM0, PSA: 6.7, Grade Group: 4) - Unsigned Prostate specific antigen (PSA) range: Less than 10 Gleason score: 8 Histologic grading system: 5 grade system    Gastric cancer ([place])  12/01/2020 Procedure   Upper Endoscopy, Dr. Benson Norway  Impression: - Normal esophagus. 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 Biopsied. - Normal examined duodenum.   12/01/2020 Pathology Results   FINAL MICROSCOPIC DIAGNOSIS:   A. INCISURA, BIOPSY:  - Adenocarcinoma, moderate to poorly differentiated arising in a  background of chronic gastritis with intestinal metaplasia.    12/01/2020 Imaging   CT CAP  IMPRESSION: Focal wall thickening along the posterior aspect of the gastric antrum, likely corresponding to the patient's newly diagnosed gastric cancer.   No findings suspicious for metastatic disease.  Mild multifocal pneumonia, lower lobe predominant, likely on the basis of aspiration. Trace right pleural effusion.   Fiducial markers along the prostate in this patient with known prostate cancer.   12/01/2020 Cancer Staging    Staging form: Stomach, AJCC 8th Edition - Clinical stage from 12/01/2020: Stage IIB (cT3, cN0, cM0) - Signed by Truitt Merle, MD on 12/20/2020 Stage prefix: Initial diagnosis Total positive nodes: 0   12/08/2020 Initial Diagnosis   Gastric cancer ([place])   12/16/2020 Procedure   EUS  - Wall thickening was seen in the lesser curve of the stomach and in the antrum of the stomach. The thickening appeared to be primarily within the serosa (Layer 5). T3 N0 Mx. - There was no sign of significant pathology in the entire pancreas. - There was no sign of significant pathology in the common bile duct and in the gallbladder. - There was no evidence of significant pathology in the left lobe of the liver. - Endosonographic images of the left adrenal gland were unremarkable. - The celiac trunk and superior mesenteric artery were endosonographically normal. - The mediastinum was unremarkable endosonographically. - No specimens collected.   01/02/2021 - 03/23/2021 Chemotherapy   Patient is on Treatment Plan : GASTROESOPHAGEAL FLOT q14d X 4 cycles     04/01/2021 Imaging   EXAM: CT ABDOMEN AND PELVIS WITHOUT CONTRAST  IMPRESSION: 1. No acute findings in the abdomen or pelvis. Specifically, no evidence for metastatic disease in the abdomen or pelvis. 2. Stable 16 mm left adrenal adenoma. 3. Small fat containing hernias in the right groin and umbilical region. 4. Bilateral pars interarticularis defects at L4 with 10 mm anterolisthesis of L4 on 5, stable. 5. Aortic Atherosclerosis (ICD10-I70.0).   04/13/2021 Imaging   EXAM: CT CHEST WITHOUT CONTRAST  IMPRESSION: 1. No evidence of metastatic disease in the chest. 2. Enlargement of the main pulmonary artery, as can be seen in pulmonary hypertension. 3. Coronary artery disease.   Aortic Atherosclerosis (ICD10-I70.0).   06/09/2021 Procedure   Upper GI Endoscopy, Dr. Benson Norway  Findings: -The esophagus was normal. -A medium healed ulcer was found on the  lesser curvature of the stomach. Biopsies were taken with a cold forceps for histology. -The examined duodenum was normal. -Compared to the prior EGD there is a marked improvement in his gastric cancer. There was only evidence of erythema and a central scar at the incisura. Cold biopsies of the area were obtained, but there was no gross evidence of malignancy.  Impression: - Normal esophagus. - Scar in the lesser curvature of the stomach. Biopsied. - Normal examined duodenum.   06/09/2021 Pathology Results   FINAL MICROSCOPIC DIAGNOSIS:   A. STOMACH, BIOPSY:  - Gastric mucosa with acute and chronic inflammation.  - No dysplasia or malignancy identified.    06/16/2021 - 06/16/2021 Chemotherapy   Patient is on Treatment Plan : GASTROESOPHAGEAL Pembrolizumab (200) q21d        INTERVAL HISTORY:  Jon Williams is here for a follow up of gastric cancer. He was last seen by me on 06/16/21. He presents to the clinic accompanied by his wife. They report he is doing better. He is using a walker now instead of a wheelchair. His wife feels he is at about 75%, so she is still helping a decent amount. They also tell me the wound on his right foot is also improving, and he denies any pain.   All other systems were reviewed with the patient and are negative.  MEDICAL HISTORY:  Past  Medical History:  Diagnosis Date   AICD (automatic cardioverter/defibrillator) present 05/25/2016   biv icd   Bunion, right    Chronic combined systolic and diastolic CHF (congestive heart failure) ([place])    Echo 1/18: Mild conc LVH, EF 15-20, severe diff HK, inf and inf-septal AK, Gr 3 DD, mild to mod MR, severe LAE, mod reduced RVSF, mod RAE, mild TR, PASP 50   Coronary artery disease involving native coronary artery without angina pectoris 04/17/2016   LHC 1/18: pLCx 30, mLCx 20, mRCA 40, dRCA 20, LVEDP 23, mean RA 8, PA 42/20, PCWP 17   Diabetes mellitus without complication (HCC)    DJD (degenerative joint disease)     Gastric cancer (HCC)    History of atrial fibrillation    History of atrial flutter    History of cardiomegaly 06/07/2016   Noted on CXR   History of colon polyps 06/28/2017   Noted on colonoscopy   LBBB (left bundle branch block)    NICM (nonischemic cardiomyopathy) ([place])    Echo 1/18:  Mild conc LVH, EF 15-20, severe diff HK, inf and inf-septal AK, Gr 3 DD, mild to mod MR, severe LAE, mod reduced RVSF, mod RAE, mild TR, PASP 50   OA (osteoarthritis)    knee   Other secondary pulmonary hypertension ([place]) 04/17/2016   Prostate cancer ([place]) 2019   Sigmoid diverticulosis 06/28/2017   Noted on colonoscopy    SURGICAL HISTORY: Past Surgical History:  Procedure Laterality Date   BIOPSY  12/01/2020   Procedure: BIOPSY;  Surgeon: [name], MD;  Location: [place];  Service: Endoscopy;;   BIOPSY  06/09/2021   Procedure: BIOPSY;  Surgeon: [name], MD;  Location: [facility];  Service: Gastroenterology;;   BIV ICD INSERTION CRT-D N/A 05/25/2016   Procedure: BiV ICD Insertion CRT-D;  Surgeon: [name], MD;  Location: [place] [facility];  Service: Cardiovascular;  Laterality: N/A;   CARDIAC CATHETERIZATION N/A 03/02/2016   Procedure: Right/Left Heart Cath and Coronary Angiography;  Surgeon: Nelva Bush, MD;  Location: [place] [facility];  Service: Cardiovascular;  Laterality: N/A;   CARDIOVERSION N/A 07/17/2016   Procedure: Cardioversion;  Surgeon: [name], MD;  Location: [place] [facility];  Service: Cardiovascular;  Laterality: N/A;   COLONOSCOPY WITH PROPOFOL N/A 06/28/2017   Procedure: COLONOSCOPY WITH PROPOFOL;  Surgeon: [name], MD;  Location: [facility];  Service: Endoscopy;  Laterality: N/A;   colonscopy  2009   ESOPHAGOGASTRODUODENOSCOPY N/A 12/01/2020   Procedure: ESOPHAGOGASTRODUODENOSCOPY (EGD);  Surgeon: [name], MD;  Location: [place];  Service: Endoscopy;  Laterality: N/A;  IDA/guaiac positive stools   ESOPHAGOGASTRODUODENOSCOPY  (EGD) WITH PROPOFOL N/A 12/16/2020   Procedure: ESOPHAGOGASTRODUODENOSCOPY (EGD) WITH PROPOFOL;  Surgeon: [name], MD;  Location: [facility];  Service: Endoscopy;  Laterality: N/A;   ESOPHAGOGASTRODUODENOSCOPY (EGD) WITH PROPOFOL N/A 06/09/2021   Procedure: ESOPHAGOGASTRODUODENOSCOPY (EGD) WITH PROPOFOL;  Surgeon: [name], MD;  [facility]: [facility];  Service: Gastroenterology;  Laterality: N/A;   GOLD SEED IMPLANT N/A 12/24/2017   Procedure: GOLD SEED IMPLANT, TRANSERINEAL;  Surgeon: Festus Aloe, MD;  Location: [facility];  Service: Urology;  Laterality: N/A;   INSERT / REPLACE / REMOVE PACEMAKER     LEAD REVISION  10/10/2018   LEAD REVISION/REPAIR N/A 10/10/2018   Procedure: LEAD REVISION/REPAIR;  Surgeon: [name], MD;  Location: [place] [facility];  Service: Cardiovascular;  Laterality: N/A;   POLYPECTOMY  06/28/2017   Procedure: POLYPECTOMY;  Surgeon: [name], MD;  Location: [facility];  Service: Endoscopy;;  ascending and descending colon polyp   PORTACATH PLACEMENT Right 12/23/2020   Procedure: INSERTION PORT-A-CATH;  Surgeon: [name], MD;  Location: [place];  Service: General;  Laterality: Right;   PROSTATE BIOPSY  02/20/2017   SPACE OAR INSTILLATION N/A 12/24/2017   Procedure: SPACE OAR INSTILLATION;  Surgeon: Festus Aloe, MD;  Location: [facility];  Service: Urology;  Laterality: N/A;   TOTAL KNEE ARTHROPLASTY Right 08/16/2017   Procedure: RIGHT TOTAL KNEE ARTHROPLASTY;  Surgeon: [name], MD;  Location: [place];  Service: Orthopedics;  Laterality: Right;   UPPER ESOPHAGEAL ENDOSCOPIC ULTRASOUND (EUS) N/A 12/16/2020   Procedure: UPPER ESOPHAGEAL ENDOSCOPIC ULTRASOUND (EUS);  Surgeon: [name], MD;  Location: [facility];  Service: Endoscopy;  Laterality: N/A;    I have reviewed the social history and family history with the patient and they are unchanged from previous note.  ALLERGIES:  is allergic to aspirin and sulfa  antibiotics.  MEDICATIONS:  Current Outpatient Medications  Medication Sig Dispense Refill   atorvastatin (LIPITOR) 40 MG tablet Take 1 tablet (40 mg total) by mouth daily. 90 tablet 3   Biotin 1000 MCG tablet Take 1,000 mcg by mouth daily with breakfast.     cholecalciferol (VITAMIN D3) 25 MCG (1000 UT) tablet Take 1,000 Units by mouth daily with breakfast.     empagliflozin (JARDIANCE) 10 MG TABS tablet Take 1 tablet (10 mg total) by mouth daily. 90 tablet 3   furosemide (LASIX) 20 MG tablet Take 20 mg by mouth daily.     gabapentin (NEURONTIN) 100 MG capsule Take 1 capsule (100 mg total) by mouth 2 (two) times daily. 60 capsule 2   metFORMIN (GLUCOPHAGE-XR) 500 MG 24 hr tablet Take 500 mg by mouth every evening.     metoprolol succinate (TOPROL-XL) 25 MG 24 hr tablet TAKE 1 TABLET(25 MG) BY MOUTH DAILY 90 tablet 3   multivitamin (ONE-A-DAY MEN'S) TABS tablet Take 1 tablet by mouth daily with breakfast.     pantoprazole (PROTONIX) 40 MG tablet Take 1 tablet (40 mg total) by mouth 2 (two) times daily. 60 tablet 0   potassium chloride (KLOR-CON) 10 MEQ tablet Twice daily 180 tablet 0   rivaroxaban (XARELTO) 20 MG TABS tablet Take 1 tablet (20 mg total) by mouth daily with supper. 30 tablet 0   sacubitril-valsartan (ENTRESTO) 24-26 MG Take 1 tablet by mouth 2 (two) times daily. 180 tablet 3   sotalol (BETAPACE) 80 MG tablet Take 1.5 tablets (120 mg total) by mouth in the morning and at bedtime. 270 tablet 2   No current facility-administered medications for this visit.    PHYSICAL EXAMINATION: ECOG PERFORMANCE STATUS: 3 - Symptomatic, >50% confined to bed  Vitals:   11/16/21 0930  BP: 98/76  Pulse: 63  Resp: 18  Temp: (!) 97.4 F (36.3 C)  SpO2: 100%   Wt Readings from Last 3 Encounters:  11/16/21 189 lb 4.8 oz (85.9 kg)  10/26/21 186 lb (84.4 kg)  09/26/21 187 lb 12.8 oz (85.2 kg)     GENERAL:alert, no distress and comfortable SKIN: skin color, texture, turgor are normal, no  rashes or significant lesions EYES: normal, Conjunctiva are pink and non-injected, sclera clear  NECK: supple, thyroid normal size, non-tender, without nodularity LYMPH:  no palpable lymphadenopathy in the cervical, axillary  LUNGS: clear to auscultation and percussion with normal breathing effort HEART: regular rate & rhythm and no murmurs and no lower extremity edema ABDOMEN:abdomen soft, non-tender and  normal bowel sounds Musculoskeletal:no cyanosis of digits and no clubbing  NEURO: alert & oriented x 3 with fluent speech, no focal motor/sensory deficits  LABORATORY DATA:  I have reviewed the data as listed    Latest Ref Rng & Units 11/16/2021    9:11 AM 09/14/2021    2:33 AM 09/13/2021    2:46 AM  CBC  WBC 4.0 - 10.5 K/uL 4.6  4.6  5.4   Hemoglobin 13.0 - 17.0 g/dL 14.2  11.0  11.9   Hematocrit 39.0 - 52.0 % 42.0  33.1  36.1   Platelets 150 - 400 K/uL 161  151  164         Latest Ref Rng & Units 11/16/2021    9:11 AM 09/26/2021   10:01 AM 09/15/2021    3:24 AM  CMP  Glucose 70 - 99 mg/dL 126  101  102   BUN 8 - 23 mg/dL _0 Creatinine 0.61 - 1.24 mg/dL 1.11  1.34  1.15   Sodium 135 - 145 mmol/L 139  140  138   Potassium 3.5 - 5.1 mmol/L 4.0  3.9  4.0   Chloride 98 - 111 mmol/L 106  110  104   CO2 22 - 32 mmol/L _1 Calcium 8.9 - 10.3 mg/dL 9.3  9.4  9.0   Total Protein 6.5 - 8.1 g/dL 7.4     Total Bilirubin 0.3 - 1.2 mg/dL 0.9     Alkaline Phos 38 - 126 U/L 49     AST 15 - 41 U/L 22     ALT 0 - 44 U/L 24         RADIOGRAPHIC STUDIES: I have personally reviewed the radiological images as listed and agreed with the findings in the report. No results found.    Orders Placed This Encounter  Procedures   CT CHEST ABDOMEN PELVIS W CONTRAST    Standing Status:   Future    Standing Expiration Date:   11/17/2022    Order Specific Question:   Preferred imaging location?    Answer:   [facility]    Order Specific Question:   Is Oral Contrast  requested for this exam?    Answer:   Yes, Per Radiology protocol   All questions were answered. The patient knows to call the clinic with any problems, questions or concerns. No barriers to learning was detected. The total time spent in the appointment was 30 minutes.     Truitt Merle, MD 11/16/2021   I, Wilburn Mylar, am acting as scribe for Truitt Merle, MD.   I have reviewed the above documentation for accuracy and completeness, and I agree with the above.

## 2021-11-17 ENCOUNTER — Other Ambulatory Visit: Payer: Self-pay | Admitting: Internal Medicine

## 2021-11-17 DIAGNOSIS — I4819 Other persistent atrial fibrillation: Secondary | ICD-10-CM

## 2021-11-21 ENCOUNTER — Ambulatory Visit: Payer: Medicare HMO | Admitting: Occupational Therapy

## 2021-11-21 DIAGNOSIS — R278 Other lack of coordination: Secondary | ICD-10-CM | POA: Diagnosis not present

## 2021-11-21 DIAGNOSIS — R2681 Unsteadiness on feet: Secondary | ICD-10-CM | POA: Diagnosis not present

## 2021-11-21 DIAGNOSIS — M6281 Muscle weakness (generalized): Secondary | ICD-10-CM

## 2021-11-21 DIAGNOSIS — R208 Other disturbances of skin sensation: Secondary | ICD-10-CM

## 2021-11-21 NOTE — Therapy (Signed)
OUTPATIENT OCCUPATIONAL THERAPY Treatment Session & Discharge Patient Name: Jon Williams MRN: [id number] DOB:11/27/1950, 71 y.o., male Today's Date: 11/21/2021  PCP: Polite, Ronald, MD REFERRING PROVIDER: Rizwan, Saima, MD   OCCUPATIONAL THERAPY DISCHARGE SUMMARY  Visits from Start of Care: 13  Current functional level related to goals / functional outcomes: Pt has made significant improvements with self-feeding and fine/gross motor control as evidenced by improved score on Box and Blocks (L: 13 to 20).  Pt verbalizes understanding for task modifications and adaptive techniques/equipment to increase independence and safety with ADLs.   Remaining deficits: Pt continues to demonstrate deformity in hands L > R, with impaired sensation and coordination.  Pt benefits from Supervision/cues for sequencing and safety with bathing at shower level.   Education / Equipment: HEP for coordination, education on adaptive strategies to compensate for decreased sensation, education on adaptive strategies and positioning for LB bathing/dressing.   Patient agrees to discharge. Patient goals were met. Patient is being discharged due to meeting the stated rehab goals and being pleased with progress made.       OT End of Session - 11/21/21 0938     Visit Number 13    Number of Visits 13    Date for OT Re-Evaluation 11/24/21    Authorization Type [employer]    OT Start Time 0935    OT Stop Time 1018    OT Time Calculation (min) 43 min                        Past Medical History:  Diagnosis Date   AICD (automatic cardioverter/defibrillator) present 05/25/2016   biv icd   Bunion, right    Chronic combined systolic and diastolic CHF (congestive heart failure) (HCC)    Echo 1/18: Mild conc LVH, EF 15-20, severe diff HK, inf and inf-septal AK, Gr 3 DD, mild to mod MR, severe LAE, mod reduced RVSF, mod RAE, mild TR, PASP 50   Coronary artery disease involving native coronary  artery without angina pectoris 04/17/2016   LHC 1/18: pLCx 30, mLCx 20, mRCA 40, dRCA 20, LVEDP 23, mean RA 8, PA 42/20, PCWP 17   Diabetes mellitus without complication (HCC)    DJD (degenerative joint disease)    Gastric cancer (HCC)    History of atrial fibrillation    History of atrial flutter    History of cardiomegaly 06/07/2016   Noted on CXR   History of colon polyps 06/28/2017   Noted on colonoscopy   LBBB (left bundle branch block)    NICM (nonischemic cardiomyopathy) (HCC)    Echo 1/18:  Mild conc LVH, EF 15-20, severe diff HK, inf and inf-septal AK, Gr 3 DD, mild to mod MR, severe LAE, mod reduced RVSF, mod RAE, mild TR, PASP 50   OA (osteoarthritis)    knee   Other secondary pulmonary hypertension (HCC) 04/17/2016   Prostate cancer (HCC) 2019   Sigmoid diverticulosis 06/28/2017   Noted on colonoscopy   Past Surgical History:  Procedure Laterality Date   BIOPSY  12/01/2020   Procedure: BIOPSY;  Surgeon: Hung, Patrick, MD;  Location: [facility];  Service: Endoscopy;;   BIOPSY  06/09/2021   Procedure: BIOPSY;  Surgeon: Hung, Patrick, MD;  Location: [facility];  Service: Gastroenterology;;   BIV ICD INSERTION CRT-D N/A 05/25/2016   Procedure: BiV ICD Insertion CRT-D;  Surgeon: Gregg W [name], MD;  [facility]: [facility];  Service: Cardiovascular;  Laterality: N/A;   CARDIAC   CATHETERIZATION N/A 03/02/2016   Procedure: Right/Left Heart Cath and Coronary Angiography;  Surgeon: Christopher End, MD;  [facility]: [facility];  Service: Cardiovascular;  Laterality: N/A;   CARDIOVERSION N/A 07/17/2016   Procedure: Cardioversion;  Surgeon: [name], Gregg W, MD;  [facility];  Service: Cardiovascular;  Laterality: N/A;   COLONOSCOPY WITH PROPOFOL N/A 06/28/2017   Procedure: COLONOSCOPY WITH PROPOFOL;  Surgeon: Hung, Patrick, MD;  Location: [facility];  Service: Endoscopy;  Laterality: N/A;   colonscopy  2009   ESOPHAGOGASTRODUODENOSCOPY N/A 12/01/2020    Procedure: ESOPHAGOGASTRODUODENOSCOPY (EGD);  Surgeon: Hung, Patrick, MD;  [facility]: [facility];  Service: Endoscopy;  Laterality: N/A;  IDA/guaiac positive stools   ESOPHAGOGASTRODUODENOSCOPY (EGD) WITH PROPOFOL N/A 12/16/2020   Procedure: ESOPHAGOGASTRODUODENOSCOPY (EGD) WITH PROPOFOL;  Surgeon: Hung, Patrick, MD;  Location: [facility];  Service: Endoscopy;  Laterality: N/A;   ESOPHAGOGASTRODUODENOSCOPY (EGD) WITH PROPOFOL N/A 06/09/2021   Procedure: ESOPHAGOGASTRODUODENOSCOPY (EGD) WITH PROPOFOL;  Surgeon: Hung, Patrick, MD;  Location: [facility];  Service: Gastroenterology;  Laterality: N/A;   GOLD SEED IMPLANT N/A 12/24/2017   Procedure: GOLD SEED IMPLANT, TRANSERINEAL;  Surgeon: Eskridge, Matthew, MD;  Location: [facility];  Service: Urology;  Laterality: N/A;   INSERT / REPLACE / REMOVE PACEMAKER     LEAD REVISION  10/10/2018   LEAD REVISION/REPAIR N/A 10/10/2018   Procedure: LEAD REVISION/REPAIR;  Surgeon: [name], Gregg W, MD;  [facility];  Service: Cardiovascular;  Laterality: N/A;   POLYPECTOMY  06/28/2017   Procedure: POLYPECTOMY;  Surgeon: Hung, Patrick, MD;  [facility]: [facility];  Service: Endoscopy;;  ascending and descending colon polyp   PORTACATH PLACEMENT Right 12/23/2020   Procedure: INSERTION PORT-A-CATH;  Surgeon: Allen, Shelby L, MD;  Location: [facility];  Service: General;  Laterality: Right;   PROSTATE BIOPSY  02/20/2017   SPACE OAR INSTILLATION N/A 12/24/2017   Procedure: SPACE OAR INSTILLATION;  Surgeon: Eskridge, Matthew, MD;  Location: [facility];  Service: Urology;  Laterality: N/A;   TOTAL KNEE ARTHROPLASTY Right 08/16/2017   Procedure: RIGHT TOTAL KNEE ARTHROPLASTY;  Surgeon: Caffrey, Daniel, MD;  Location: [facility];  Service: Orthopedics;  Laterality: Right;   UPPER ESOPHAGEAL ENDOSCOPIC ULTRASOUND (EUS) N/A 12/16/2020   Procedure: UPPER ESOPHAGEAL ENDOSCOPIC ULTRASOUND (EUS);  Surgeon: Hung, Patrick, MD;  Location: [facility];  Service: Endoscopy;   Laterality: N/A;   Patient Active Problem List   Diagnosis Date Noted   Malnutrition of moderate degree 09/15/2021   DM (diabetes mellitus), type 2 (HCC) 09/14/2021   Essential hypertension 09/13/2021   Dyslipidemia 09/13/2021   DNR (do not resuscitate) 09/13/2021   Port-A-Cath in place 06/15/2021   Hypotension due to hypovolemia 04/04/2021   Nausea vomiting and diarrhea 04/04/2021   Thrombocytopenia (HCC) 04/04/2021   Leukocytosis 04/04/2021   Acute renal failure superimposed on stage 3a chronic kidney disease (HCC) 04/04/2021   Diarrhea 04/01/2021   Occult blood in stools 04/01/2021   Foot ulcer, right (HCC) 04/01/2021   Hypotension 04/01/2021   Dehydration 04/01/2021   Iron deficiency anemia due to chronic blood loss 12/12/2020   Gastric cancer (HCC) 12/08/2020   Symptomatic anemia 11/30/2020   Gastrointestinal hemorrhage    Corns and callosities 08/07/2019   Blood clotting disorder (HCC) 05/06/2019   Coagulation defect (HCC) 05/06/2019   Pacemaker lead malfunction 10/10/2018   Pacemaker complications 10/10/2018   Pacemaker lead failure, sequela 10/10/2018   S/P knee replacement 08/16/2017   S/P total knee arthroplasty 08/16/2017   Malignant neoplasm of prostate (HCC) 04/02/2017     ICD (implantable cardioverter-defibrillator) in place    Persistent atrial fibrillation (HCC) 05/25/2016   Atrial fibrillation (HCC) 05/25/2016   Coronary artery disease involving native coronary artery without angina pectoris 04/17/2016   Other secondary pulmonary hypertension (HCC) 04/17/2016   Acute on chronic combined systolic and diastolic CHF (congestive heart failure) (HCC)    NICM (nonischemic cardiomyopathy) (HCC)    LBBB (left bundle branch block)     ONSET DATE: noted onset of increased symptoms and loss of function during chemo around February 2023 (REFERRAL Date: 09/15/21)  REFERRING DIAG: I50.9 (ICD-10-CM) - CHF (congestive heart failure)  THERAPY DIAG:  Other lack of  coordination  Muscle weakness (generalized)  Other disturbances of skin sensation  Unsteadiness on feet  Rationale for Evaluation and Treatment Rehabilitation  SUBJECTIVE:   SUBJECTIVE STATEMENT: Pt reports standing more in the shower, washing LB while standing Pt accompanied by: self and significant other (Marilene)  PERTINENT HISTORY: Had a recent bout of chemo for his gastric tumor, ICD, CAD, a-fib, gastric CA, GI bleed, CHF, hypotension, LVEF 20-25%,   PRECAUTIONS: Fall  WEIGHT BEARING RESTRICTIONS No  PAIN:  Are you having pain? Yes: NPRS scale: 4/10 Pain location: fingers Pain description: stiffness, pins and needles Aggravating factors: FMC tasks Relieving factors: rest   PATIENT GOALS to become independent again  OBJECTIVE:   HAND DOMINANCE: Right  ADLs: Eating: decreased control of utensil, is currently using built up handles Grooming: Mod I UB Dressing: typically wears elastic waist and pull over shirts LB Dressing: wife assists with setup of items, pt requires increased time, now wearing elastic waist pants and slip on shoes Toileting: Mod I, requires increased time for hygiene post toileting Bathing: Setup for bathing with wife applying wash mitts and adjusting water, pt able to wash body Tub Shower transfers: tub/shower transfer with supervision from spouse, has tub transfer bench and grab bars Equipment: Transfer tub bench, Grab bars, and bed side commode  FUNCTIONAL OUTCOME MEASURES: Physical performance test: PPT #2: 24.72* 10/26/21: PPT #2: 22.16 sec 10/31/21: PPT #2: 17.62 sec 11/21/21: PPT #2: 21.52 sec  UPPER EXTREMITY ROM     Active ROM Right eval Right 10/26/21 Left eval Left 10/26/21  Shoulder flexion 130 142 126 138  Shoulder abduction      Shoulder adduction      Shoulder extension      Shoulder internal rotation      Shoulder external rotation      Elbow flexion      Elbow extension      Wrist flexion      Wrist extension       Wrist ulnar deviation      Wrist radial deviation      Wrist pronation      Wrist supination      (Blank rows = not tested)   UPPER EXTREMITY MMT:     MMT Right eval Left eval  Shoulder flexion 4+/5 4+/5  Shoulder abduction    Shoulder adduction    Shoulder extension    Shoulder internal rotation    Shoulder external rotation    Middle trapezius    Lower trapezius    Elbow flexion 4+/5 4+/5  Elbow extension 4+/5 4+/5  Wrist flexion    Wrist extension    Wrist ulnar deviation    Wrist radial deviation    Wrist pronation    Wrist supination    (Blank rows = not tested)   COORDINATION: Finger Nose Finger test: slow bilaterally, required demonstration for   technique Box and Blocks:  Right 25 blocks, Left 13 blocks 10/17/21 - Right: 25 blocks, Left: 18 blocks 10/31/21- Right: 27 blocks, Left: 18 blocks 11/21/21 - Right: 27 blocks, Left: 20 blocks  SENSATION: Light touch: Impaired  Stereognosis: Impaired  Hot/Cold: Impaired   COGNITION: Overall cognitive status:  Pt required demonstration and multimodal cues throughout evaluation, question further cognitive impairments.  VISION: Subjective report: s/p chemo pt with increased watery eyes, now wearing glasses instead of contacts Baseline vision: Wears glasses all the time --------------------------------------------------------------------------------------------------------------------------------------------------------   TODAY'S TREATMENT: 11/21/21 ADL: reviewed progression from tub/shower to walk-in shower with focus on increased standing tolerance and balance while in tub shower with tub bench.  Pt's wife reports decreased sequencing with bathing, therefore reviewed recommendations for sequencing with bathing and when/how to provide cues to facilitate increased carryover and independence.  OT educated on crossing lower legs into figure 4 position to increase safety and ease with washing BLE.  Pt reports plan to attempt  during next shower. Box and blocks: Right: 25 and Left: 20.  Pt still demonstrating decreased sustained grasp due to impaired sensation, however improved coordination and motor control once grasp obtained. Self-feeding: engaged in simulated self-feeding (see above for time). Pt continues to demonstrate inconsistency with motor control, however does benefit from use of built up handle - and it using built up handle at home.    11/14/21 Coordination: picking up small stones with RUE and LUE and crossing midline to place in cup.  Focus on thumb opposition and precision pinch.  Therapist placed towel under stones to provide increased friction to facilitate increased success with picking up smooth stones.  Pt requiring increased time, however demonstrating improved opposition and ability to complete bilaterally.   ADL: discussed transition from tub/shower to walk-in shower.  OT educated on DME for walk-in shower to allow for improved ease of transition as pt still sitting for majority of shower.  Discussed possibility of attempting to increase standing bouts during shower in tub/shower with tub bench for sitting as needed.  Pt and wife in agreement with plan to attempt to increase standing during shower and assess current DME to see if appropriate fit into walk-in shower. Coordination: placing stones on top of pegs with focus on increased motor control and coordination.  Increased challenge to incorporate standing to progress towards standing for functional tasks, to include shower.  Pt tolerated standing 5 mins prior to requiring seated rest break.   11/07/21 Handwriting: Utilized built up handle to attempt to facilitate increased legibility and sustained grasp on writing utensil.  Discussed purchasing weighted and/or larger grip pen due to improved legibility with standard pen that was marginally larger width and weighted.  Provided pictures of examples of pens and recommended checking out office supply area  in stores for variety of pens. Coordination: picking up and placing large grip pegs into resistive peg board with focus on precision pinch and gross grasp as needed to place pegs in pegboard to replicate pattern.  Pt requiring use of L hand to position pegs into R hand for improved grip. Pt requiring increased time and assist to pick up pegs when attempting to utilize LUE.  Pt demonstrating gross grasp on L compared to precision grasp on R and significant increase in challenge with picking up and placing pegs on L.   PATIENT EDUCATION: Education details: Educated on functional carryover of grasp and reaching tasks into home making/IADLs.  Discussed DME and progression to transitioning from tub/shower to walk-in shower. Person  educated: Patient and Spouse Education method: Explanation and Demonstration Education comprehension: verbalized understanding   HOME EXERCISE PROGRAM: Coordination exercises - See pt instructions  Access Code: [email or web address] URL: [email or web address] Date: 10/31/2021 Prepared by: [facility] - [facility]  Exercises - Putty Squeezes  - 1 x daily - 1 sets - 10 reps - "Duck mouth"  - 1 x daily - 1 sets - 10 reps - Rolling Putty on Table  - 1 x daily - 1 sets - 10 reps - Thumb Opposition with Putty  - 1 x daily - 1 sets - 10 reps - Thumb AROM Opposition  - 1 x daily - 2 sets - 10 reps - Seated Finger MP Extension AROM with Blocking  - 1 x daily - 2 sets - 10 reps    GOALS: Goals reviewed with patient? Yes  SHORT TERM GOALS: Target date: 10/27/21  Pt will be independent with HEP for FMC to increase coordination as needed for ADLs and IADLs. Baseline: Goal status: MET - reports completing HEP daily  2.  Pt will demonstrate improved UE functional use for ADLs as evidenced by increasing box/ blocks score by 3 blocks with LUE Baseline: Right 25 blocks, Left 13 blocks Goal status: MET - 18 blocks on 10/17/21  3.  Pt will demonstrate  ability to retrieve a lightweight object with BUE at moderate height range to demonstrate increased UE strength as needed to complete ADLs and IADLs.  Baseline: R shoulder 130* and L shoulder 124* Goal status: MET - L:138* and R: 142* on 10/26/21    LONG TERM GOALS: Target date: 11/24/21  Pt will demonstrate improved UE functional use for ADLs as evidenced by increasing box/ blocks score by 6 blocks with LUE Baseline: Right 25 blocks, Left 13 blocks Goal status: MET Right: 27 blocks and Left: 20 blocks on 11/21/21  2.  Pt will demonstrate improved ease with feeding as evidenced by decreasing PPT#2 (self feeding) by 3 secs Baseline: PPT #2: 24.72 Goal status: MET 21.52 sec 11/21/21  3.  Pt will verbalize understanding of strategies, safety consideration, and AE needs to ensure safety due to decreased sensation.  Baseline:  Goal status: MET 11/21/21  4.  Pt will verbalize improved ROM and motor control with ability to complete hygiene post toileting  Baseline:  Goal status: MET 11/21/21   ASSESSMENT:  CLINICAL IMPRESSION: Treatment session with focus on visual attention to UE during coordination activities and self-feeding. Reviewed recommendations for transition from tub/shower to walk-in shower and educated on figure 4 technique for LB dressing.  Pt's wife continues to assist with bathing, providing setup and cues for sequencing and thoroughness. Pt pleased with functional progress with grasp and motor control.  Pt reports that he needs to be diligent to work on these things that he has learned in OT at home and complete HEP.  Discussed typical protocol to contact MD if need for OT referral in the future, pt and spouse report understanding.  PERFORMANCE DEFICITS in functional skills including ADLs, IADLs, coordination, dexterity, proprioception, sensation, ROM, strength, pain, flexibility, FMC, GMC, mobility, balance, body mechanics, endurance, cardiopulmonary status limiting function,  decreased knowledge of use of DME, skin integrity, and UE functional use.  IMPAIRMENTS are limiting patient from ADLs and IADLs.   COMORBIDITIES may have co-morbidities  that affects occupational performance. Patient will benefit from skilled OT to address above impairments and improve overall function.  MODIFICATION OR ASSISTANCE TO COMPLETE EVALUATION: Min-Moderate modification of tasks or   assist with assess necessary to complete an evaluation.  OT OCCUPATIONAL PROFILE AND HISTORY: Detailed assessment: Review of records and additional review of physical, cognitive, psychosocial history related to current functional performance.  CLINICAL DECISION MAKING: Moderate - several treatment options, min-mod task modification necessary  REHAB POTENTIAL: Good  EVALUATION COMPLEXITY: Moderate    PLAN: OT FREQUENCY: 1-2x/week 2x/week initially and may decrease to 1x/week pending progress  OT DURATION: 8 weeks  PLANNED INTERVENTIONS: self care/ADL training, therapeutic exercise, therapeutic activity, neuromuscular re-education, manual therapy, passive range of motion, balance training, functional mobility training, paraffin, fluidotherapy, moist heat, patient/family education, energy conservation, and DME and/or AE instructions  RECOMMENDED OTHER SERVICES: NA  CONSULTED AND AGREED WITH PLAN OF CARE: Patient and family member/caregiver  PLAN FOR NEXT SESSION: D/C from OT.   HOXIE, SARAH, OTR/L 11/21/2021, 11:45 AM          

## 2021-11-23 DIAGNOSIS — E1169 Type 2 diabetes mellitus with other specified complication: Secondary | ICD-10-CM | POA: Diagnosis not present

## 2021-11-23 DIAGNOSIS — N400 Enlarged prostate without lower urinary tract symptoms: Secondary | ICD-10-CM | POA: Diagnosis not present

## 2021-11-23 DIAGNOSIS — G8929 Other chronic pain: Secondary | ICD-10-CM | POA: Diagnosis not present

## 2021-11-23 DIAGNOSIS — I5043 Acute on chronic combined systolic (congestive) and diastolic (congestive) heart failure: Secondary | ICD-10-CM | POA: Diagnosis not present

## 2021-11-23 DIAGNOSIS — E7800 Pure hypercholesterolemia, unspecified: Secondary | ICD-10-CM | POA: Diagnosis not present

## 2021-12-04 ENCOUNTER — Other Ambulatory Visit: Payer: Self-pay | Admitting: Internal Medicine

## 2021-12-04 NOTE — Telephone Encounter (Signed)
Prescription refill request for Xarelto received.  Indication:Afib Last office visit:9/23 Weight:85.9 kg Age:71 Scr:1.1 CrCl:72.67 ml/min  Prescription refilled

## 2021-12-08 ENCOUNTER — Other Ambulatory Visit: Payer: Self-pay

## 2021-12-08 ENCOUNTER — Other Ambulatory Visit: Payer: Self-pay | Admitting: Hematology

## 2021-12-08 DIAGNOSIS — C163 Malignant neoplasm of pyloric antrum: Secondary | ICD-10-CM

## 2021-12-11 ENCOUNTER — Ambulatory Visit: Payer: Medicare HMO | Admitting: Cardiovascular Disease

## 2021-12-13 ENCOUNTER — Other Ambulatory Visit: Payer: Self-pay | Admitting: Pharmacist

## 2021-12-13 MED ORDER — ENTRESTO 24-26 MG PO TABS: 1.0000 | ORAL_TABLET | Freq: Two times a day (BID) | ORAL | 3 refills | Status: DC

## 2021-12-15 ENCOUNTER — Encounter: Payer: Self-pay | Admitting: Cardiovascular Disease

## 2021-12-15 NOTE — Telephone Encounter (Signed)
Error

## 2021-12-15 NOTE — Telephone Encounter (Signed)
This encounter was created in error - please disregard.

## 2021-12-18 ENCOUNTER — Telehealth: Payer: Self-pay

## 2021-12-18 NOTE — Telephone Encounter (Signed)
    Primary Cardiologist: Mertie Moores, MD  Chart reviewed as part of pre-operative protocol coverage. Simple dental extractions are considered low risk procedures per guidelines and generally do not require any specific cardiac clearance. It is also generally accepted that for simple extractions and dental cleanings, there is no need to interrupt blood thinner therapy.   SBE prophylaxis is not required for the patient.  I will route this recommendation to the requesting party via Epic fax function and remove from pre-op pool.  Please call with questions.  Deberah Pelton, NP 12/18/2021, 11:53 AM

## 2021-12-18 NOTE — Telephone Encounter (Signed)
   Pre-operative Risk Assessment    Patient Name: Jon Williams  DOB: 1950/04/10 MRN: [id number]     Request for Surgical Clearance    Procedure:   SCALING & ROOT PLANNING; GINGIVAL IRRIGATION; ANTI MICRO BIO;  ARESTIN ADDITIONALLY   Date of Surgery:  Clearance TBD                                 Surgeon:  DR Allena Napoleon OR DR [place] or Practice Name:  [facility] Phone number:  [phone] Fax number:  [phone]   Type of Clearance Requested:   - Medical  - Pharmacy:  [facility] Rivaroxaban (Xarelto)     Type of Anesthesia:   MEDICATED SOLUTION   Additional requests/questions:  N/A  Job Founds T   12/18/2021, 9:54 AM

## 2021-12-18 NOTE — Telephone Encounter (Signed)
Contacted [facility] to get additional information for pre-op clearance. Awaiting a call back.

## 2021-12-20 ENCOUNTER — Telehealth: Payer: Self-pay

## 2021-12-20 NOTE — Telephone Encounter (Signed)
**Note De-Identified [place] Harmes Obfuscation** Refill request form received from [facility] [employer].  I have completed the form for Entresto 24-26 mg #180 with 3 refills and have e-mailed it to Dr Tanna Furry nurse so he can obtain his signature, date it, and to fax to the fax number circled at the top of the form.

## 2021-12-21 ENCOUNTER — Other Ambulatory Visit: Payer: Self-pay | Admitting: Hematology

## 2021-12-25 ENCOUNTER — Inpatient Hospital Stay (HOSPITAL_COMMUNITY)
Admission: EM | Admit: 2021-12-25 | Discharge: 2021-12-27 | DRG: 193 | Disposition: A | Discharge status: Home / Self Care | Payer: Medicare HMO | Attending: Internal Medicine | Admitting: Internal Medicine

## 2021-12-25 ENCOUNTER — Other Ambulatory Visit: Payer: Self-pay

## 2021-12-25 ENCOUNTER — Encounter (HOSPITAL_COMMUNITY): Payer: Self-pay | Admitting: Family Medicine

## 2021-12-25 ENCOUNTER — Emergency Department (HOSPITAL_COMMUNITY): Payer: Medicare HMO

## 2021-12-25 DIAGNOSIS — Z79899 Other long term (current) drug therapy: Secondary | ICD-10-CM

## 2021-12-25 DIAGNOSIS — J90 Pleural effusion, not elsewhere classified: Secondary | ICD-10-CM | POA: Diagnosis not present

## 2021-12-25 DIAGNOSIS — E669 Obesity, unspecified: Secondary | ICD-10-CM | POA: Diagnosis not present

## 2021-12-25 DIAGNOSIS — Z1152 Encounter for screening for COVID-19: Secondary | ICD-10-CM

## 2021-12-25 DIAGNOSIS — I2729 Other secondary pulmonary hypertension: Secondary | ICD-10-CM | POA: Diagnosis present

## 2021-12-25 DIAGNOSIS — Z6829 Body mass index (BMI) 29.0-29.9, adult: Secondary | ICD-10-CM

## 2021-12-25 DIAGNOSIS — Z7901 Long term (current) use of anticoagulants: Secondary | ICD-10-CM

## 2021-12-25 DIAGNOSIS — I5043 Acute on chronic combined systolic (congestive) and diastolic (congestive) heart failure: Secondary | ICD-10-CM | POA: Diagnosis not present

## 2021-12-25 DIAGNOSIS — E785 Hyperlipidemia, unspecified: Secondary | ICD-10-CM | POA: Diagnosis not present

## 2021-12-25 DIAGNOSIS — Z7984 Long term (current) use of oral hypoglycemic drugs: Secondary | ICD-10-CM

## 2021-12-25 DIAGNOSIS — R079 Chest pain, unspecified: Secondary | ICD-10-CM | POA: Diagnosis not present

## 2021-12-25 DIAGNOSIS — E119 Type 2 diabetes mellitus without complications: Secondary | ICD-10-CM | POA: Diagnosis not present

## 2021-12-25 DIAGNOSIS — I10 Essential (primary) hypertension: Secondary | ICD-10-CM | POA: Diagnosis present

## 2021-12-25 DIAGNOSIS — C163 Malignant neoplasm of pyloric antrum: Secondary | ICD-10-CM | POA: Diagnosis not present

## 2021-12-25 DIAGNOSIS — I2510 Atherosclerotic heart disease of native coronary artery without angina pectoris: Secondary | ICD-10-CM | POA: Diagnosis not present

## 2021-12-25 DIAGNOSIS — I509 Heart failure, unspecified: Principal | ICD-10-CM

## 2021-12-25 DIAGNOSIS — J811 Chronic pulmonary edema: Secondary | ICD-10-CM | POA: Diagnosis not present

## 2021-12-25 DIAGNOSIS — Z8546 Personal history of malignant neoplasm of prostate: Secondary | ICD-10-CM

## 2021-12-25 DIAGNOSIS — J189 Pneumonia, unspecified organism: Principal | ICD-10-CM

## 2021-12-25 DIAGNOSIS — I110 Hypertensive heart disease with heart failure: Secondary | ICD-10-CM | POA: Diagnosis not present

## 2021-12-25 DIAGNOSIS — M1990 Unspecified osteoarthritis, unspecified site: Secondary | ICD-10-CM | POA: Diagnosis present

## 2021-12-25 DIAGNOSIS — K5730 Diverticulosis of large intestine without perforation or abscess without bleeding: Secondary | ICD-10-CM | POA: Diagnosis present

## 2021-12-25 DIAGNOSIS — Z882 Allergy status to sulfonamides status: Secondary | ICD-10-CM | POA: Diagnosis not present

## 2021-12-25 DIAGNOSIS — Z87892 Personal history of anaphylaxis: Secondary | ICD-10-CM

## 2021-12-25 DIAGNOSIS — Z8249 Family history of ischemic heart disease and other diseases of the circulatory system: Secondary | ICD-10-CM

## 2021-12-25 DIAGNOSIS — Z9221 Personal history of antineoplastic chemotherapy: Secondary | ICD-10-CM

## 2021-12-25 DIAGNOSIS — Z96651 Presence of right artificial knee joint: Secondary | ICD-10-CM | POA: Diagnosis present

## 2021-12-25 DIAGNOSIS — Z833 Family history of diabetes mellitus: Secondary | ICD-10-CM

## 2021-12-25 DIAGNOSIS — I4819 Other persistent atrial fibrillation: Secondary | ICD-10-CM | POA: Diagnosis not present

## 2021-12-25 DIAGNOSIS — R059 Cough, unspecified: Secondary | ICD-10-CM | POA: Diagnosis not present

## 2021-12-25 DIAGNOSIS — C169 Malignant neoplasm of stomach, unspecified: Secondary | ICD-10-CM | POA: Diagnosis present

## 2021-12-25 DIAGNOSIS — I428 Other cardiomyopathies: Secondary | ICD-10-CM

## 2021-12-25 DIAGNOSIS — Z886 Allergy status to analgesic agent status: Secondary | ICD-10-CM | POA: Diagnosis not present

## 2021-12-25 DIAGNOSIS — Z95810 Presence of automatic (implantable) cardiac defibrillator: Secondary | ICD-10-CM

## 2021-12-25 LAB — CBC WITH DIFFERENTIAL/PLATELET
Abs Immature Granulocytes: 0.02 10*3/uL (ref 0.00–0.07)
Basophils Absolute: 0 10*3/uL (ref 0.0–0.1)
Basophils Relative: 0 %
Eosinophils Absolute: 0.1 10*3/uL (ref 0.0–0.5)
Eosinophils Relative: 1 %
HCT: 41.6 % (ref 39.0–52.0)
Hemoglobin: 13.7 g/dL (ref 13.0–17.0)
Immature Granulocytes: 0 %
Lymphocytes Relative: 14 %
Lymphs Abs: 0.9 10*3/uL (ref 0.7–4.0)
MCH: 33.6 pg (ref 26.0–34.0)
MCHC: 32.9 g/dL (ref 30.0–36.0)
MCV: 102 fL — ABNORMAL HIGH (ref 80.0–100.0)
Monocytes Absolute: 0.7 10*3/uL (ref 0.1–1.0)
Monocytes Relative: 10 %
Neutro Abs: 5 10*3/uL (ref 1.7–7.7)
Neutrophils Relative %: 75 %
Platelets: 173 10*3/uL (ref 150–400)
RBC: 4.08 MIL/uL — ABNORMAL LOW (ref 4.22–5.81)
RDW: 13.7 % (ref 11.5–15.5)
WBC: 6.6 10*3/uL (ref 4.0–10.5)
nRBC: 0 % (ref 0.0–0.2)

## 2021-12-25 LAB — GLUCOSE, CAPILLARY
Glucose-Capillary: 113 mg/dL — ABNORMAL HIGH (ref 70–99)
Glucose-Capillary: 141 mg/dL — ABNORMAL HIGH (ref 70–99)

## 2021-12-25 LAB — BASIC METABOLIC PANEL
Anion gap: 10 (ref 5–15)
BUN: 19 mg/dL (ref 8–23)
CO2: 18 mmol/L — ABNORMAL LOW (ref 22–32)
Calcium: 8.9 mg/dL (ref 8.9–10.3)
Chloride: 115 mmol/L — ABNORMAL HIGH (ref 98–111)
Creatinine, Ser: 1.07 mg/dL (ref 0.61–1.24)
GFR, Estimated: >60 mL/min (ref >60)
Glucose, Bld: 106 mg/dL — ABNORMAL HIGH (ref 70–99)
Potassium: 3.9 mmol/L (ref 3.5–5.1)
Sodium: 143 mmol/L (ref 135–145)

## 2021-12-25 LAB — RESP PANEL BY RT-PCR (FLU A&B, COVID) ARPGX2
Influenza A by PCR: NEGATIVE
Influenza B by PCR: NEGATIVE
SARS Coronavirus 2 by RT PCR: NEGATIVE

## 2021-12-25 LAB — TROPONIN I (HIGH SENSITIVITY)
Troponin I (High Sensitivity): 3 ng/L (ref <18)
Troponin I (High Sensitivity): 3 ng/L (ref <18)

## 2021-12-25 LAB — BRAIN NATRIURETIC PEPTIDE: B Natriuretic Peptide: 2219.4 pg/mL — ABNORMAL HIGH (ref 0.0–100.0)

## 2021-12-25 MED ORDER — SODIUM CHLORIDE 0.9 % IV SOLN
2.0000 g | INTRAVENOUS | Status: DC
  Administered 2021-12-26: 2 g via INTRAVENOUS
  Filled 2021-12-25: qty 20

## 2021-12-25 MED ORDER — ONDANSETRON HCL 4 MG/2ML IJ SOLN: 4.0000 mg | Freq: Four times a day (QID) | INTRAMUSCULAR | Status: DC | PRN

## 2021-12-25 MED ORDER — INSULIN ASPART 100 UNIT/ML IJ SOLN: 0.0000 [IU] | Freq: Three times a day (TID) | INTRAMUSCULAR | Status: DC

## 2021-12-25 MED ORDER — FUROSEMIDE 10 MG/ML IJ SOLN
60.0000 mg | Freq: Every day | INTRAMUSCULAR | Status: DC
  Administered 2021-12-26: 60 mg via INTRAVENOUS
  Filled 2021-12-25: qty 6

## 2021-12-25 MED ORDER — ATORVASTATIN CALCIUM 40 MG PO TABS
40.0000 mg | ORAL_TABLET | Freq: Every day | ORAL | Status: DC
  Administered 2021-12-25 – 2021-12-27 (×3): 40 mg via ORAL
  Filled 2021-12-25 (×3): qty 1

## 2021-12-25 MED ORDER — METOPROLOL SUCCINATE ER 25 MG PO TB24
25.0000 mg | ORAL_TABLET | Freq: Every day | ORAL | Status: DC
  Administered 2021-12-25 – 2021-12-27 (×3): 25 mg via ORAL
  Filled 2021-12-25 (×3): qty 1

## 2021-12-25 MED ORDER — POTASSIUM CHLORIDE ER 10 MEQ PO TBCR
10.0000 meq | EXTENDED_RELEASE_TABLET | Freq: Two times a day (BID) | ORAL | Status: DC
  Administered 2021-12-25 – 2021-12-27 (×4): 10 meq via ORAL
  Filled 2021-12-25 (×8): qty 1

## 2021-12-25 MED ORDER — AZITHROMYCIN 250 MG PO TABS: 500.0000 mg | ORAL_TABLET | Freq: Every day | ORAL | Status: DC

## 2021-12-25 MED ORDER — RIVAROXABAN 20 MG PO TABS
20.0000 mg | ORAL_TABLET | Freq: Every day | ORAL | Status: DC
  Administered 2021-12-25 – 2021-12-26 (×2): 20 mg via ORAL
  Filled 2021-12-25 (×2): qty 1

## 2021-12-25 MED ORDER — INSULIN ASPART 100 UNIT/ML IJ SOLN: 0.0000 [IU] | Freq: Every day | INTRAMUSCULAR | Status: DC

## 2021-12-25 MED ORDER — ACETAMINOPHEN 650 MG RE SUPP: 650.0000 mg | Freq: Four times a day (QID) | RECTAL | Status: DC | PRN

## 2021-12-25 MED ORDER — ONDANSETRON HCL 4 MG PO TABS: 4.0000 mg | ORAL_TABLET | Freq: Four times a day (QID) | ORAL | Status: DC | PRN

## 2021-12-25 MED ORDER — GABAPENTIN 100 MG PO CAPS
100.0000 mg | ORAL_CAPSULE | Freq: Two times a day (BID) | ORAL | Status: DC
  Administered 2021-12-25 – 2021-12-27 (×4): 100 mg via ORAL
  Filled 2021-12-25 (×4): qty 1

## 2021-12-25 MED ORDER — SODIUM CHLORIDE 0.9 % IV SOLN
1.0000 g | Freq: Once | INTRAVENOUS | Status: AC
  Administered 2021-12-25: 1 g via INTRAVENOUS
  Filled 2021-12-25: qty 10

## 2021-12-25 MED ORDER — ACETAMINOPHEN 325 MG PO TABS
650.0000 mg | ORAL_TABLET | Freq: Four times a day (QID) | ORAL | Status: DC | PRN
  Administered 2021-12-27: 650 mg via ORAL
  Filled 2021-12-25: qty 2

## 2021-12-25 MED ORDER — DOXYCYCLINE HYCLATE 100 MG PO TABS
100.0000 mg | ORAL_TABLET | Freq: Two times a day (BID) | ORAL | Status: DC
  Administered 2021-12-26 – 2021-12-27 (×2): 100 mg via ORAL
  Filled 2021-12-25 (×2): qty 1

## 2021-12-25 MED ORDER — SACUBITRIL-VALSARTAN 24-26 MG PO TABS
1.0000 | ORAL_TABLET | Freq: Two times a day (BID) | ORAL | Status: DC
  Administered 2021-12-25 – 2021-12-27 (×4): 1 via ORAL
  Filled 2021-12-25 (×4): qty 1

## 2021-12-25 MED ORDER — FUROSEMIDE 10 MG/ML IJ SOLN
40.0000 mg | Freq: Once | INTRAMUSCULAR | Status: AC
  Administered 2021-12-25: 40 mg via INTRAVENOUS
  Filled 2021-12-25: qty 4

## 2021-12-25 MED ORDER — SOTALOL HCL 120 MG PO TABS
120.0000 mg | ORAL_TABLET | Freq: Two times a day (BID) | ORAL | Status: DC
  Administered 2021-12-25 – 2021-12-27 (×4): 120 mg via ORAL
  Filled 2021-12-25 (×4): qty 1

## 2021-12-25 MED ORDER — SODIUM CHLORIDE 0.9 % IV SOLN
500.0000 mg | Freq: Once | INTRAVENOUS | Status: AC
  Administered 2021-12-25: 500 mg via INTRAVENOUS
  Filled 2021-12-25: qty 5

## 2021-12-25 MED ORDER — PANTOPRAZOLE SODIUM 40 MG PO TBEC
40.0000 mg | DELAYED_RELEASE_TABLET | Freq: Two times a day (BID) | ORAL | Status: DC
  Administered 2021-12-25 – 2021-12-27 (×4): 40 mg via ORAL
  Filled 2021-12-25 (×4): qty 1

## 2021-12-25 NOTE — Assessment & Plan Note (Signed)
Echocardiogram 3 months ago showed EF 20 to 25%, mitral regurgitation, grade 3 diastolic dysfunction.  Here he has some dyspnea on exertion, cardiomegaly, elevated BNP, and bilateral opacities consistent with CHF -Furosemide 60 mg IV daily, may be able to transition back to home oral Lasix after tomorrow's dose -K supplement -Strict I/Os, daily weights, telemetry  -Daily monitoring renal function

## 2021-12-25 NOTE — ED Triage Notes (Signed)
Pt. Stated, Donnald Garre had a cough and some chest pain that started yesterday.

## 2021-12-25 NOTE — Assessment & Plan Note (Signed)
-   Continue atorvastatin -Heart healthy diet discussed with patient.

## 2021-12-25 NOTE — Assessment & Plan Note (Addendum)
-  Continue atorvastatin and metoprolol. -No chest pain.

## 2021-12-25 NOTE — ED Provider Notes (Signed)
[facility] EMERGENCY DEPARTMENT Provider Note   CSN: [id number] Arrival date & time: 12/25/21  1015     History  Chief Complaint  Jon Williams presents with   Chest Pain   Cough    Jon Williams is a 71 year old male with history of gastric cancer,  CHF, Afib s/p pacemaker who presents to Jon emergency department for cough, congestion and shortness of breath.  Jon Williams reports that Jon Williams began developing a productive cough and shortness of breath last night.  Jon Williams has not had any fevers or sick contacts.  Jon Williams also reports some chest tightness that is associated with coughing only.  No prior history of heart attacks.  Jon Williams is not actively on chemotherapy, last was treated in February for Jon Williams gastric cancer which is now in remission.  Jon Williams takes Lasix daily and Xarelto, last took both yesterday and no other missed doses other than this morning.  Jon Williams has not been able to ambulate much today because Jon Williams has been feeling poorly.  Jon Williams has gained 3 pounds in Jon last 2 days.  Jon Williams denies any recent surgery, hemoptysis, leg pain, or hormone use.  Jon history is provided by Jon Williams and medical records.  Chest Pain Associated symptoms: cough   Cough Associated symptoms: chest pain        Home Medications Prior to Admission medications   Medication Sig Start Date End Date Taking? Authorizing Provider  atorvastatin (LIPITOR) 40 MG tablet Take 1 tablet (40 mg total) by mouth daily. 04/24/21  Yes [name], MD  Biotin 1000 MCG tablet Take 1,000 mcg by mouth daily with breakfast.   Yes [provider]  cholecalciferol (VITAMIN D3) 25 MCG (1000 UT) tablet Take 1,000 Units by mouth daily with breakfast.   Yes [provider]  empagliflozin (JARDIANCE) 10 MG TABS tablet Take 1 tablet (10 mg total) by mouth daily. 10/06/21  Yes [name], [name], MD  furosemide (LASIX) 20 MG tablet Take 20 mg by mouth daily.   Yes [provider]  gabapentin (NEURONTIN) 100 MG  capsule TAKE 1 CAPSULE(100 MG) BY MOUTH TWICE DAILY Jon Williams taking differently: Take 100 mg by mouth 2 (two) times daily. 12/21/21  Yes Truitt Merle, MD  metFORMIN (GLUCOPHAGE-XR) 500 MG 24 hr tablet Take 500 mg by mouth every evening. 06/13/20  Yes [provider]  metoprolol succinate (TOPROL-XL) 25 MG 24 hr tablet TAKE 1 TABLET(25 MG) BY MOUTH DAILY Jon Williams taking differently: Take 25 mg by mouth daily. 06/08/21  Yes Swinyer, Lanice Schwab, NP  multivitamin (ONE-A-DAY MEN'S) TABS tablet Take 1 tablet by mouth daily with breakfast.   Yes [provider]  pantoprazole (PROTONIX) 40 MG tablet Take 1 tablet (40 mg total) by mouth 2 (two) times daily. 12/02/20  Yes Aline August, MD  potassium chloride (KLOR-CON) 10 MEQ tablet Twice daily Jon Williams taking differently: Take 10 mEq by mouth 2 (two) times daily. Twice daily 05/31/21  Yes Daleen Bo, MD  rivaroxaban (XARELTO) 20 MG TABS tablet TAKE 1 TABLET BY MOUTH EVERY DAY WITH SUPPER Jon Williams taking differently: Take 20 mg by mouth daily. 12/04/21  Yes [name], MD  sacubitril-valsartan (ENTRESTO) 24-26 MG Take 1 tablet by mouth 2 (two) times daily. Jon Williams taking differently: Take 1 tablet by mouth 2 (two) times daily. Only take if systolic BP >638 75/6/43  Yes [name], MD  sotalol (BETAPACE) 80 MG tablet TAKE 1 AND 1/2 TABLETS(120 MG) BY MOUTH IN Jon MORNING  AND AT BEDTIME Jon Williams taking differently: Take 120 mg by mouth 2 (two) times daily. 11/17/21  Yes [name], MD  prochlorperazine (COMPAZINE) 10 MG tablet Take 1 tablet (10 mg total) by mouth every 6 (six) hours as needed (Nausea or vomiting). 02/15/21 05/12/21  Truitt Merle, MD      Allergies    Aspirin and Sulfa antibiotics    Review of Systems   Review of Systems  Respiratory:  Positive for cough.   Cardiovascular:  Positive for chest pain.    Physical Exam Updated Vital Signs BP (!) 116/93   Pulse 61   Temp 98 F (36.7 C) (Oral)   Resp (!) 24   SpO2  98%   Physical Exam Vitals and nursing note reviewed.  Constitutional:      General: Jon Williams is not in acute distress.    Appearance: Jon Williams is not toxic-appearing.  HENT:     Head: Normocephalic and atraumatic.  Cardiovascular:     Rate and Rhythm: Regular rhythm.     Pulses:          Radial pulses are 2+ on Jon right side and 2+ on Jon left side.       Posterior tibial pulses are 2+ on Jon right side and 2+ on Jon left side.  Pulmonary:     Effort: Pulmonary effort is normal.     Comments: Diminished breath sounds in bilateral bases.  Slight fluid wheeze. Abdominal:     Palpations: Abdomen is soft.     Tenderness: There is no abdominal tenderness.  Musculoskeletal:        General: Normal range of motion.     Right lower leg: No tenderness. Edema present.     Left lower leg: No tenderness. Edema present.     Comments: 1+ bilateral lower extremity edema to knee.  Skin:    General: Skin is warm and dry.     Capillary Refill: Capillary refill takes less than 2 seconds.  Neurological:     General: No focal deficit present.     Mental Status: Jon Williams is alert and oriented to person, place, and time.     Motor: No weakness.     ED Results / Procedures / Treatments   Labs (all labs ordered are listed, but only abnormal results are displayed) Labs Reviewed  CBC WITH DIFFERENTIAL/PLATELET - Abnormal; Notable for Jon following components:      Result Value   RBC 4.08 (*)    MCV 102.0 (*)    All other components within normal limits  BASIC METABOLIC PANEL - Abnormal; Notable for Jon following components:   Chloride 115 (*)    CO2 18 (*)    Glucose, Bld 106 (*)    All other components within normal limits  BRAIN NATRIURETIC PEPTIDE - Abnormal; Notable for Jon following components:   B Natriuretic Peptide 2,219.4 (*)    All other components within normal limits  RESP PANEL BY RT-PCR (FLU A&B, COVID) [id number]  TROPONIN Jon Williams (HIGH SENSITIVITY)  TROPONIN Jon Williams (HIGH SENSITIVITY)    EKG EKG  Interpretation  Date/Time:  Monday December 25 2021 11:14:33 EST Ventricular Rate:  67 PR Interval:  104 QRS Duration: 156 QT Interval:  426 QTC Calculation: 450 R Axis:   114 Text Interpretation: VENTRICULAR PACED RHYTHM Artifact Abnormal ECG Confirmed by Carmin Muskrat [phone]) on 12/25/2021 3:12:29 PM  Radiology DG Chest 2 View  Result Date: 12/25/2021 CLINICAL DATA:  Cough.  Chest pain. EXAM: CHEST - 2 VIEW  COMPARISON:  Two-view chest x-ray 09/13/2021 FINDINGS: Heart is enlarged. Right subclavian Port-A-Cath is stable pace. Pacing wires are stable. Asymmetric right lower lobe airspace disease and effusion is present. Mild pulmonary vascular congestion is noted. Lungs are otherwise clear. IMPRESSION: 1. Asymmetric right lower lobe airspace disease and effusion concerning for pneumonia. 2. Cardiomegaly and mild pulmonary vascular congestion. Electronically Signed   By: San Morelle M.D.   On: 12/25/2021 11:34    Procedures Procedures    Medications Ordered in ED Medications  furosemide (LASIX) injection 40 mg (40 mg Intravenous Given 12/25/21 1548)  cefTRIAXone (ROCEPHIN) 1 g in sodium chloride 0.9 % 100 mL IVPB (0 g Intravenous Stopped 12/25/21 1624)  azithromycin (ZITHROMAX) 500 mg in sodium chloride 0.9 % 250 mL IVPB (0 mg Intravenous Stopped 12/25/21 1751)    ED Course/ Medical Decision Making/ A&P Clinical Course as of 12/25/21 1802  Mon Dec 25, 2021  1508 BNP 2219.  CXR w/ Asymmetric right lower lobe airspace disease and effusion concerning for pneumonia. Cardiomegaly and mild pulmonary vascular congestion. [DG]    Clinical Course User Index [DG] Renard Matter, MD                           Medical Decision Making Problems Addressed: Acute on chronic congestive heart failure, unspecified heart failure type [facility]): acute illness or injury that poses a threat to life or bodily functions Community acquired pneumonia of right lower lobe of lung: acute illness  or injury that poses a threat to life or bodily functions  Amount and/or Complexity of Data Reviewed Independent Historian: spouse External Data Reviewed: labs and notes. Labs: ordered. Decision-making details documented in ED Course. Radiology: ordered and independent interpretation performed. Decision-making details documented in ED Course. ECG/medicine tests: ordered and independent interpretation performed. Decision-making details documented in ED Course.  Risk Prescription drug management. Decision regarding hospitalization.   Brief Overview IVORY BAIL is a 71 y.o. male who presents with shortness of breath as per above.  Jon Williams have reviewed Jon nursing documentation for past medical history, family history, and social history and agree.  Jon Williams have reviewed Jon Williams's vital signs, which demonstrate no significant abnormalities.   Initial Differential Diagnoses: Jon Williams am primarily concerned for CHF exacerbation in Jon setting of weight gain and prior history and lower extremity edema on exam, as well as possible pneumonia given productive cough.  Jon Williams has no history of smoking or COPD, and no wheezing on exam to suggest reactive airway disease. Jon Williams has no urticaria or edema on exam, no wheezing, no allergic exposures, and no hypotension to suggest anaphylaxis.  Differential also included ACS, but given Jon Williams's lack of history and lack of typical chest pain Jon Williams have a lower suspicion, however will obtain troponin and EKG.  Unlikely PE, as Jon Williams does not have active malignancy with treatment in last 6 months, no recent surgery, no hypoxia, no tachycardia, no history of DVT/PE, no hemoptysis, no exogenous hormone use, no signs of DVT on exam, and is on Xarelto.  Workup: CBC without leukocytosis, hemoglobin within normal limits at 13.7 BMP with creatinine 1.07, potassium 3.9, glucose 106, no elevation in anion gap.  Troponin negative at 3.  BNP negative at 2219.  Most recent prior  644 three months ago.   Chest x-ray on my independent review as well as review by radiology demonstrates signs of pulmonary edema, cardiomegaly, and more significant right lower lobe opacification and effusion, concerning for pneumonia.  EKG demonstrates paced rhythm, no evidence of acute ischemia on my review.  Final Impression and Disposition: Jon Williams's presentation is most consistent with CHF exacerbation, community-acquired pneumonia.  Jon Williams treated with 40 mg IV Lasix in ED as well as ceftriaxone, Rocephin.  Jon Williams remained short of breath with rolling over in bed and for this reason as well as Jon Williams age and multiple acute medical conditions, Jon Williams feel that Jon Williams requires admission at this time.  This was discussed with Jon Williams and Jon Williams wife at bedside, who agreed.  Jon Williams admitted to hospitalist for further management.         Final Clinical Impression(s) / ED Diagnoses Final diagnoses:  Acute on chronic congestive heart failure, unspecified heart failure type [facility])  Community acquired pneumonia of right lower lobe of lung    Rx / DC Orders ED Discharge Orders     None         Renard Matter, MD 12/25/21 [place]    Carmin Muskrat, MD 12/26/21 2215

## 2021-12-25 NOTE — Assessment & Plan Note (Signed)
Presented with cough, pleuritic chest discomfort, and thick sputum.  Chest x-ray shows right lower lobe opacity with pleural effusion.  PSI 123, risk class IV. - Continue Rocephin and azithromycin - Aggressive pulmonary toilet, flutter and incentive spirometer

## 2021-12-25 NOTE — Assessment & Plan Note (Signed)
BMI 30.1

## 2021-12-25 NOTE — ED Notes (Addendum)
ED TO INPATIENT HANDOFF REPORT  ED Nurse Name and Phone #: Caryl Pina RN [phone]  S Name/Age/Gender Jon Williams 71 y.o. male Room/Bed: 130Q/657Q  Code Status   Code Status: Prior  Home/SNF/Other Home Patient oriented to: self, place, time, and situation Is this baseline? Yes   Triage Complete: Triage complete  Chief Complaint Acute on chronic combined systolic and diastolic CHF (congestive heart failure) ([place]) [I50.43]  Triage Note Pt. Stated, Donnald Garre had a cough and some chest pain that started yesterday.   Allergies Allergies  Allergen Reactions   Aspirin Anaphylaxis and Hives   Sulfa Antibiotics Anaphylaxis, Hives, Swelling and Other (See Comments)    Swollen lips    Level of Care/Admitting Diagnosis ED Disposition     ED Disposition  Admit   Condition  --   Comment  Hospital Area: [facility] [[id number]]  Level of Care: Telemetry Cardiac [103]  May place patient in observation at [facility] or [facility] if equivalent level of care is available:: Yes  Covid Evaluation: Symptomatic Person Under Investigation (PUI) or recent exposure (last 10 days) *Testing Required*  Diagnosis: Acute on chronic combined systolic and diastolic CHF (congestive heart failure) [facility]) [[id number]]  Admitting Physician: Edwin Dada [[id number]]  Attending Physician: Edwin Dada [[id number]]          B Medical/Surgery History Past Medical History:  Diagnosis Date   AICD (automatic cardioverter/defibrillator) present 05/25/2016   biv icd   Bunion, right    Chronic combined systolic and diastolic CHF (congestive heart failure) ([place])    Echo 1/18: Mild conc LVH, EF 15-20, severe diff HK, inf and inf-septal AK, Gr 3 DD, mild to mod MR, severe LAE, mod reduced RVSF, mod RAE, mild TR, PASP 50   Coronary artery disease involving native coronary artery without angina pectoris 04/17/2016   LHC 1/18: pLCx 30, mLCx 20, mRCA 40, dRCA 20, LVEDP 23, mean RA 8, PA  42/20, PCWP 17   Diabetes mellitus without complication (HCC)    DJD (degenerative joint disease)    Gastric cancer ([place])    History of atrial fibrillation    History of atrial flutter    History of cardiomegaly 06/07/2016   Noted on CXR   History of colon polyps 06/28/2017   Noted on colonoscopy   LBBB (left bundle branch block)    NICM (nonischemic cardiomyopathy) ([place])    Echo 1/18:  Mild conc LVH, EF 15-20, severe diff HK, inf and inf-septal AK, Gr 3 DD, mild to mod MR, severe LAE, mod reduced RVSF, mod RAE, mild TR, PASP 50   OA (osteoarthritis)    knee   Other secondary pulmonary hypertension ([place]) 04/17/2016   Prostate cancer ([place]) 2019   Sigmoid diverticulosis 06/28/2017   Noted on colonoscopy   Past Surgical History:  Procedure Laterality Date   BIOPSY  12/01/2020   Procedure: BIOPSY;  Surgeon: [name], MD;  Location: [facility];  Service: Endoscopy;;   BIOPSY  06/09/2021   Procedure: BIOPSY;  Surgeon: [name], MD;  Location: [facility];  Service: Gastroenterology;;   BIV ICD INSERTION CRT-D N/A 05/25/2016   Procedure: BiV ICD Insertion CRT-D;  Surgeon: [name], MD;  Location: [facility] CV [facility];  Service: Cardiovascular;  Laterality: N/A;   CARDIAC CATHETERIZATION N/A 03/02/2016   Procedure: Right/Left Heart Cath and Coronary Angiography;  Surgeon: Nelva Bush, MD;  Location: [facility];  Service: Cardiovascular;  Laterality: N/A;   CARDIOVERSION N/A 07/17/2016  Procedure: Cardioversion;  Surgeon: [name], MD;  Location: [facility];  Service: Cardiovascular;  Laterality: N/A;   COLONOSCOPY WITH PROPOFOL N/A 06/28/2017   Procedure: COLONOSCOPY WITH PROPOFOL;  Surgeon: [name], MD;  Location: [facility];  Service: Endoscopy;  Laterality: N/A;   colonscopy  2009   ESOPHAGOGASTRODUODENOSCOPY N/A 12/01/2020   Procedure: ESOPHAGOGASTRODUODENOSCOPY (EGD);  Surgeon: [name], MD;  Location: [facility];  Service:  Endoscopy;  Laterality: N/A;  IDA/guaiac positive stools   ESOPHAGOGASTRODUODENOSCOPY (EGD) WITH PROPOFOL N/A 12/16/2020   Procedure: ESOPHAGOGASTRODUODENOSCOPY (EGD) WITH PROPOFOL;  Surgeon: [name], MD;  Location: [facility];  Service: Endoscopy;  Laterality: N/A;   ESOPHAGOGASTRODUODENOSCOPY (EGD) WITH PROPOFOL N/A 06/09/2021   Procedure: ESOPHAGOGASTRODUODENOSCOPY (EGD) WITH PROPOFOL;  Surgeon: [name], MD;  Location: [facility];  Service: Gastroenterology;  Laterality: N/A;   GOLD SEED IMPLANT N/A 12/24/2017   Procedure: GOLD SEED IMPLANT, TRANSERINEAL;  Surgeon: Festus Aloe, MD;  Location: [facility];  Service: Urology;  Laterality: N/A;   INSERT / REPLACE / REMOVE PACEMAKER     LEAD REVISION  10/10/2018   LEAD REVISION/REPAIR N/A 10/10/2018   Procedure: LEAD REVISION/REPAIR;  Surgeon: [name], MD;  Location: [facility];  Service: Cardiovascular;  Laterality: N/A;   POLYPECTOMY  06/28/2017   Procedure: POLYPECTOMY;  Surgeon: [name], MD;  [facility];  Service: Endoscopy;;  ascending and descending colon polyp   PORTACATH PLACEMENT Right 12/23/2020   Procedure: INSERTION PORT-A-CATH;  Surgeon: [name], MD;  Location: [facility];  Service: General;  Laterality: Right;   PROSTATE BIOPSY  02/20/2017   SPACE OAR INSTILLATION N/A 12/24/2017   Procedure: SPACE OAR INSTILLATION;  Surgeon: Festus Aloe, MD;  [facility];  Service: Urology;  Laterality: N/A;   TOTAL KNEE ARTHROPLASTY Right 08/16/2017   Procedure: RIGHT TOTAL KNEE ARTHROPLASTY;  Surgeon: [name], MD;  Location: [facility];  Service: Orthopedics;  Laterality: Right;   UPPER ESOPHAGEAL ENDOSCOPIC ULTRASOUND (EUS) N/A 12/16/2020   Procedure: UPPER ESOPHAGEAL ENDOSCOPIC ULTRASOUND (EUS);  Surgeon: [name], MD;  Location: [facility];  Service: Endoscopy;  Laterality: N/A;     A IV Location/Drains/Wounds Patient Lines/Drains/Airways Status     Active  Line/Drains/Airways     Name Placement date Placement time Site Days   Implanted Port 12/23/20 Right Chest 12/23/20  1142  Chest  367   Peripheral IV 12/25/21 20 G Anterior;Right Forearm 12/25/21  1548  Forearm  less than 1   Wound / Incision (Open or Dehisced) 04/02/21 Venous stasis ulcer Foot Anterior;Right open wound on foot 04/02/21  0800  Foot  267            Intake/Output Last 24 hours  Intake/Output Summary (Last 24 hours) at 12/25/2021 1654 Last data filed at 12/25/2021 1630 Gross per 24 hour  Intake --  Output 500 ml  Net -500 ml    Labs/Imaging Results for orders placed or performed during the hospital encounter of 12/25/21 (from the past 48 hour(s))  CBC with Differential     Status: Abnormal   Collection Time: 12/25/21 11:45 AM  Result Value Ref Range   WBC 6.6 4.0 - 10.5 K/uL   RBC 4.08 (L) 4.22 - 5.81 MIL/uL   Hemoglobin 13.7 13.0 - 17.0 g/dL   HCT 41.6 39.0 - 52.0 %   MCV 102.0 (H) 80.0 - 100.0 fL   MCH 33.6 26.0 - 34.0 pg   MCHC 32.9 30.0 - 36.0 g/dL   RDW  13.7 11.5 - 15.5 %   Platelets 173 150 - 400 K/uL   nRBC 0.0 0.0 - 0.2 %   Neutrophils Relative % 75 %   Neutro Abs 5.0 1.7 - 7.7 K/uL   Lymphocytes Relative 14 %   Lymphs Abs 0.9 0.7 - 4.0 K/uL   Monocytes Relative 10 %   Monocytes Absolute 0.7 0.1 - 1.0 K/uL   Eosinophils Relative 1 %   Eosinophils Absolute 0.1 0.0 - 0.5 K/uL   Basophils Relative 0 %   Basophils Absolute 0.0 0.0 - 0.1 K/uL   Immature Granulocytes 0 %   Abs Immature Granulocytes 0.02 0.00 - 0.07 K/uL    Comment: Performed at [facility] [address] [place], [place] [address]  Basic metabolic panel     Status: Abnormal   Collection Time: 12/25/21 11:45 AM  Result Value Ref Range   Sodium 143 135 - 145 mmol/L   Potassium 3.9 3.5 - 5.1 mmol/L   Chloride 115 (H) 98 - 111 mmol/L   CO2 18 (L) 22 - 32 mmol/L   Glucose, Bld 106 (H) 70 - 99 mg/dL    Comment: Glucose reference range applies only to samples taken after  fasting for at least 8 hours.   BUN 19 8 - 23 mg/dL   Creatinine, Ser 1.07 0.61 - 1.24 mg/dL   Calcium 8.9 8.9 - 10.3 mg/dL   GFR, Estimated >60 >60 mL/min    Comment: (NOTE) Calculated using the CKD-EPI Creatinine Equation (2021)    Anion gap 10 5 - 15    Comment: Performed at [facility] [address] [place], [place] [address]  Troponin I (High Sensitivity)     Status: None   Collection Time: 12/25/21 11:45 AM  Result Value Ref Range   Troponin I (High Sensitivity) 3 <18 ng/L    Comment: (NOTE) Elevated high sensitivity troponin I (hsTnI) values and significant  changes across serial measurements may suggest ACS but many other  chronic and acute conditions are known to elevate hsTnI results.  Refer to the "Links" section for chest pain algorithms and additional  guidance. Performed at [facility], [place] [address] [place], [place] [address]   Brain natriuretic peptide     Status: Abnormal   Collection Time: 12/25/21 11:45 AM  Result Value Ref Range   B Natriuretic Peptide 2,219.4 (H) 0.0 - 100.0 pg/mL    Comment: Performed at [facility] [address], [place], [place] [address]  Troponin I (High Sensitivity)     Status: None   Collection Time: 12/25/21  3:47 PM  Result Value Ref Range   Troponin I (High Sensitivity) 3 <18 ng/L    Comment: (NOTE) Elevated high sensitivity troponin I (hsTnI) values and significant  changes across serial measurements may suggest ACS but many other  chronic and acute conditions are known to elevate hsTnI results.  Refer to the "Links" section for chest pain algorithms and additional  guidance. Performed at [facility], [place] [address], [place], [place] [address]    DG Chest 2 View  Result Date: 12/25/2021 CLINICAL DATA:  Cough.  Chest pain. EXAM: CHEST - 2 VIEW COMPARISON:  Two-view chest x-ray 09/13/2021 FINDINGS: Heart is enlarged. Right subclavian Port-A-Cath is stable pace. Pacing wires are stable.  Asymmetric right lower lobe airspace disease and effusion is present. Mild pulmonary vascular congestion is noted. Lungs are otherwise clear. IMPRESSION: 1. Asymmetric right lower lobe airspace disease  and effusion concerning for pneumonia. 2. Cardiomegaly and mild pulmonary vascular congestion. Electronically Signed   By: San Morelle M.D.   On: 12/25/2021 11:34    Pending Labs Unresulted Labs (From admission, onward)     Start     Ordered   12/25/21 1055  Resp Panel by RT-PCR (Flu A&B, Covid) Anterior Nasal Swab  (Tier 2 - Symptomatic Resp Panel by RT-PCR (Flu A&B, Covid))  Once,   URGENT        12/25/21 1054            Vitals/Pain Today's Vitals   12/25/21 1242 12/25/21 1428 12/25/21 1600 12/25/21 1620  BP: 120/88 134/83 99/81 126/86  Pulse: 64 65 65   Resp: 16 18 (!) 21 15  Temp:  98.4 F (36.9 C)    TempSrc:  Oral    SpO2: 96% 98% 100%   PainSc:        Isolation Precautions Airborne and Contact precautions  Medications Medications  azithromycin (ZITHROMAX) 500 mg in sodium chloride 0.9 % 250 mL IVPB (500 mg Intravenous New Bag/Given 12/25/21 1629)  furosemide (LASIX) injection 40 mg (40 mg Intravenous Given 12/25/21 1548)  cefTRIAXone (ROCEPHIN) 1 g in sodium chloride 0.9 % 100 mL IVPB (0 g Intravenous Stopped 12/25/21 1624)    Mobility walks with person assist Low fall risk   Focused Assessments Cardiac Assessment Handoff:    Lab Results  Component Value Date   CKTOTAL 163 04/01/2021   No results found for: "DDIMER" Does the Patient currently have chest pain? No    R Recommendations: See Admitting Provider Note  Report given to:   Additional Notes: Pt A&Ox4, using urinal.

## 2021-12-25 NOTE — Progress Notes (Signed)
Pt arrived from ED, VSS, orders checked and released. CHG complete  Alvis Lemmings, RN 12/25/2021 6:24 PM

## 2021-12-25 NOTE — Assessment & Plan Note (Signed)
Rate controlled - Continue Xarelto, Betapace, metoprolol

## 2021-12-25 NOTE — Hospital Course (Signed)
Jon Williams is a 71 y.o. M with gastric CA cT3N0M0 dx'd 10/22 s/p chemo, sCHF EF 20-25%, hx AICD and PPM, DM, CAD, persAF on Xarelto and Betapace, and obesity who presented with few days cough, sputum.  In the ER, noted to have chest x-ray with right lower lobe opacity, pleural effusion, cardiomegaly.  Also BNP 2219.  Given Lasix, Rocephin, azithromycin and admitted for pneumonia and CHF.

## 2021-12-25 NOTE — Assessment & Plan Note (Signed)
-   Continue outpatient follow-up with cardiology. -Continue GDMT as prescribed use; Entresto, Jardiance, metoprolol and now spironolactone. -Low-sodium diet and adequate hydration discussed with patient.

## 2021-12-25 NOTE — ED Provider Triage Note (Signed)
Emergency Medicine Provider Triage Evaluation Note  IZIAH CATES , a 71 y.o. male  was evaluated in triage.  Pt complains of cough, congestion. Began yesterday. Associated CP, SOB. No LE swelling, fever, emesis. No sick contacts. CA in remission  Review of Systems  Positive: Cough, congestion, CP, SOB Negative: Fever, LE swelling  Physical Exam  BP 123/86   Pulse 70   Temp (!) 97.5 F (36.4 C)   Resp 16   SpO2 100%  Gen:   Awake, no distress   Resp:  Normal effort  MSK:   Moves extremities without difficulty  Other:    Medical Decision Making  Medically screening exam initiated at 10:54 AM.  Appropriate orders placed.  [name] was informed that the remainder of the evaluation will be completed by another provider, this initial triage assessment does not replace that evaluation, and the importance of remaining in the ED until their evaluation is complete.  Cough, CP   Keiaira Donlan A, PA-C 12/25/21 1055

## 2021-12-25 NOTE — Assessment & Plan Note (Signed)
Blood pressure controlled - Continue Entresto, metoprolol

## 2021-12-25 NOTE — Assessment & Plan Note (Signed)
Glucose controlled - Hold metformin - Sliding scale corrections

## 2021-12-25 NOTE — H&P (Signed)
History and Physical    Patient: Jon Williams TOI:[id number] DOB: 05/09/1950 DOA: 12/25/2021 DOS: the patient was seen and examined on 12/25/2021 PCP: [name], MD  Patient coming from: Home  Chief Complaint:  Chief Complaint  Patient presents with   Chest Pain   Cough       HPI:  [name] is a 71 y.o. M with gastric CA [id number] dx'd 10/22 s/p chemo, sCHF EF 20-25%, hx AICD and PPM, DM, CAD, persAF on Xarelto and Betapace, and obesity who presented with few days cough, sputum.  In the ER, noted to have chest x-ray with right lower lobe opacity, pleural effusion, cardiomegaly.  Also BNP 2219.  Given Lasix, Rocephin, azithromycin and admitted for pneumonia and CHF.      Review of Systems  Constitutional:  Negative for chills and fever.  Respiratory:  Positive for cough, sputum production and shortness of breath. Negative for hemoptysis and wheezing.   Cardiovascular:  Positive for chest pain. Negative for orthopnea, leg swelling and PND.  All other systems reviewed and are negative.    Past Medical History:  Diagnosis Date   AICD (automatic cardioverter/defibrillator) present 05/25/2016   biv icd   Bunion, right    Chronic combined systolic and diastolic CHF (congestive heart failure) ([place])    Echo 1/18: Mild conc LVH, EF 15-20, severe diff HK, inf and inf-septal AK, Gr 3 DD, mild to mod MR, severe LAE, mod reduced RVSF, mod RAE, mild TR, PASP 50   Coronary artery disease involving native coronary artery without angina pectoris 04/17/2016   LHC 1/18: pLCx 30, mLCx 20, mRCA 40, dRCA 20, LVEDP 23, mean RA 8, PA 42/20, PCWP 17   Diabetes mellitus without complication (HCC)    DJD (degenerative joint disease)    Gastric cancer (HCC)    History of atrial fibrillation    History of atrial flutter    History of cardiomegaly 06/07/2016   Noted on CXR   History of colon polyps 06/28/2017   Noted on colonoscopy   LBBB (left bundle branch block)    NICM (nonischemic  cardiomyopathy) ([place])    Echo 1/18:  Mild conc LVH, EF 15-20, severe diff HK, inf and inf-septal AK, Gr 3 DD, mild to mod MR, severe LAE, mod reduced RVSF, mod RAE, mild TR, PASP 50   OA (osteoarthritis)    knee   Other secondary pulmonary hypertension ([place]) 04/17/2016   Prostate cancer ([place]) 2019   Sigmoid diverticulosis 06/28/2017   Noted on colonoscopy   Past Surgical History:  Procedure Laterality Date   BIOPSY  12/01/2020   Procedure: BIOPSY;  Surgeon: [name], MD;  Location: [facility];  Service: Endoscopy;;   BIOPSY  06/09/2021   Procedure: BIOPSY;  Surgeon: [name], MD;  Location: [facility];  Service: Gastroenterology;;   BIV ICD INSERTION CRT-D N/A 05/25/2016   Procedure: BiV ICD Insertion CRT-D;  Surgeon: [name], MD;  Location: [facility];  Service: Cardiovascular;  Laterality: N/A;   CARDIAC CATHETERIZATION N/A 03/02/2016   Procedure: Right/Left Heart Cath and Coronary Angiography;  Surgeon: Nelva Bush, MD;  Location: [place] [facility];  Service: Cardiovascular;  Laterality: N/A;   CARDIOVERSION N/A 07/17/2016   Procedure: Cardioversion;  Surgeon: [name], MD;  Location: [facility];  Service: Cardiovascular;  Laterality: N/A;   COLONOSCOPY WITH PROPOFOL N/A 06/28/2017   Procedure: COLONOSCOPY WITH PROPOFOL;  Surgeon: [name], MD;  Location: [facility];  Service: Endoscopy;  Laterality: N/A;   colonscopy  2009   ESOPHAGOGASTRODUODENOSCOPY N/A 12/01/2020   Procedure: ESOPHAGOGASTRODUODENOSCOPY (EGD);  Surgeon: [name], MD;  Location: [facility];  Service: Endoscopy;  Laterality: N/A;  IDA/guaiac positive stools   ESOPHAGOGASTRODUODENOSCOPY (EGD) WITH PROPOFOL N/A 12/16/2020   Procedure: ESOPHAGOGASTRODUODENOSCOPY (EGD) WITH PROPOFOL;  Surgeon: [name], MD;  [facility]: [facility];  Service: Endoscopy;  Laterality: N/A;   ESOPHAGOGASTRODUODENOSCOPY (EGD) WITH PROPOFOL N/A 06/09/2021   Procedure:  ESOPHAGOGASTRODUODENOSCOPY (EGD) WITH PROPOFOL;  Surgeon: [name], MD;  [facility]: [facility];  Service: Gastroenterology;  Laterality: N/A;   GOLD SEED IMPLANT N/A 12/24/2017   Procedure: GOLD SEED IMPLANT, TRANSERINEAL;  Surgeon: Festus Aloe, MD;  Location: [facility];  Service: Urology;  Laterality: N/A;   INSERT / REPLACE / REMOVE PACEMAKER     LEAD REVISION  10/10/2018   LEAD REVISION/REPAIR N/A 10/10/2018   Procedure: LEAD REVISION/REPAIR;  Surgeon: [name], MD;  Location: [facility];  Service: Cardiovascular;  Laterality: N/A;   POLYPECTOMY  06/28/2017   Procedure: POLYPECTOMY;  Surgeon: [name], MD;  [facility];  Service: Endoscopy;;  ascending and descending colon polyp   PORTACATH PLACEMENT Right 12/23/2020   Procedure: INSERTION PORT-A-CATH;  Surgeon: [name], MD;  Location: [facility];  Service: General;  Laterality: Right;   PROSTATE BIOPSY  02/20/2017   SPACE OAR INSTILLATION N/A 12/24/2017   Procedure: SPACE OAR INSTILLATION;  Surgeon: Festus Aloe, MD;  Location: [facility];  Service: Urology;  Laterality: N/A;   TOTAL KNEE ARTHROPLASTY Right 08/16/2017   Procedure: RIGHT TOTAL KNEE ARTHROPLASTY;  Surgeon: [name], MD;  Location: [facility];  Service: Orthopedics;  Laterality: Right;   UPPER ESOPHAGEAL ENDOSCOPIC ULTRASOUND (EUS) N/A 12/16/2020   Procedure: UPPER ESOPHAGEAL ENDOSCOPIC ULTRASOUND (EUS);  Surgeon: [name], MD;  Location: [facility];  Service: Endoscopy;  Laterality: N/A;   Social History:  reports that he has never smoked. He has never used smokeless tobacco. He reports that he does not drink alcohol and does not use drugs.  Allergies  Allergen Reactions   Aspirin Anaphylaxis and Hives   Sulfa Antibiotics Anaphylaxis, Hives, Swelling and Other (See Comments)    Swollen lips    Family History  Problem Relation Age of Onset   Hypertension Mother    Heart disease Mother    Diabetes Mother    Diabetes  Father    Hypertension Father    Cancer Father        lung cancer   Healthy Sister    Heart attack Brother    Heart disease Brother 37       + tobacco   Healthy Brother     Prior to Admission medications   Medication Sig Start Date End Date Taking? Authorizing Provider  atorvastatin (LIPITOR) 40 MG tablet Take 1 tablet (40 mg total) by mouth daily. 04/24/21  Yes [name], MD  Biotin 1000 MCG tablet Take 1,000 mcg by mouth daily with breakfast.   Yes [provider]  cholecalciferol (VITAMIN D3) 25 MCG (1000 UT) tablet Take 1,000 Units by mouth daily with breakfast.   Yes [provider]  empagliflozin (JARDIANCE) 10 MG TABS tablet Take 1 tablet (10 mg total) by mouth daily. 10/06/21  Yes [name], [name], MD  furosemide (LASIX) 20 MG tablet Take 20 mg by mouth daily.   Yes [provider]  gabapentin (NEURONTIN) 100 MG capsule TAKE 1 CAPSULE(100 MG) BY MOUTH TWICE DAILY Patient taking differently: Take 100  mg by mouth 2 (two) times daily. 12/21/21  Yes Truitt Merle, MD  metFORMIN (GLUCOPHAGE-XR) 500 MG 24 hr tablet Take 500 mg by mouth every evening. 06/13/20  Yes [provider]  metoprolol succinate (TOPROL-XL) 25 MG 24 hr tablet TAKE 1 TABLET(25 MG) BY MOUTH DAILY Patient taking differently: Take 25 mg by mouth daily. 06/08/21  Yes Swinyer, Lanice Schwab, NP  multivitamin (ONE-A-DAY MEN'S) TABS tablet Take 1 tablet by mouth daily with breakfast.   Yes [provider]  pantoprazole (PROTONIX) 40 MG tablet Take 1 tablet (40 mg total) by mouth 2 (two) times daily. 12/02/20  Yes Aline August, MD  potassium chloride (KLOR-CON) 10 MEQ tablet Twice daily Patient taking differently: Take 10 mEq by mouth 2 (two) times daily. Twice daily 05/31/21  Yes Daleen Bo, MD  rivaroxaban (XARELTO) 20 MG TABS tablet TAKE 1 TABLET BY MOUTH EVERY DAY WITH SUPPER Patient taking differently: Take 20 mg by mouth daily. 12/04/21  Yes [name], MD   sacubitril-valsartan (ENTRESTO) 24-26 MG Take 1 tablet by mouth 2 (two) times daily. Patient taking differently: Take 1 tablet by mouth 2 (two) times daily. Only take if systolic BP >712 45/8/09  Yes [name], MD  sotalol (BETAPACE) 80 MG tablet TAKE 1 AND 1/2 TABLETS(120 MG) BY MOUTH IN THE MORNING AND AT BEDTIME Patient taking differently: Take 120 mg by mouth 2 (two) times daily. 11/17/21  Yes [name], MD  prochlorperazine (COMPAZINE) 10 MG tablet Take 1 tablet (10 mg total) by mouth every 6 (six) hours as needed (Nausea or vomiting). 02/15/21 05/12/21  Truitt Merle, MD    Physical Exam: Vitals:   12/25/21 1700 12/25/21 1740 12/25/21 1748 12/25/21 1822  BP: 120/88 (!) 116/93  (!) 135/95  Pulse: 61 61  73  Resp: (!) 26 (!) 24  (!) 22  Temp:   98 F (36.7 C) (!) 97.5 F (36.4 C)  TempSrc:   Oral Oral  SpO2: 99% 98%  99%          General appearance: Alert adult male, lying in bed, no acute distress, responds appropriate to questions, hygiene and dress appropriate     HEENT: Anicteric, conjunctive a pink, lids and lashes normal.  Visual tracking smooth, oropharynx moist without lesions, dentition normal, auditory acuity normal Cardiac: RRR, no murmurs, no peripheral edema, JVP normal Respiratory: Respiratory rate normal, lungs diminished at the right base, crackles at the left base, no wheezing Abdomen: Abdomen soft without tenderness palpation or guarding, no ascites or distention, no masses or splenomegaly I can appreciate MSK: Symmetrical without gross deformities of the hands, large joints or legs Skin: Skin intact without significant rashes or lesions Neuro: Muscle tone seems normal, speech fluent, naming grossly intact patient's recall and recent and remote as well as general fund of knowledge seem within normal limits Psych: Attention normal, affect appropriate, judgment and insight appear normal   Data Reviewed: Discussed with emergency room provider Basic  metabolic panel unremarkable CBC shows no leukocytosis or anemia Troponin negative BNP elevated Chest x-ray, personally reviewed, shows right lower lobe opacity with associated effusion, cardiomegaly EKG personally reviewed, shows ventricular paced rhythm    Assessment and Plan: * Community acquired pneumonia of right lower lobe of lung Presented with cough, pleuritic chest discomfort, and thick sputum.  Chest x-ray shows right lower lobe opacity with pleural effusion.  PSI 123, risk class IV. - Continue Rocephin and azithromycin - Aggressive pulmonary toilet, flutter and incentive spirometer  Acute on chronic combined systolic and diastolic CHF (congestive heart failure) (HCC) Echocardiogram 3 months ago showed EF 20 to 25%, mitral regurgitation, grade 3 diastolic dysfunction.  Here he has some dyspnea on exertion, cardiomegaly, elevated BNP, and bilateral opacities consistent with CHF -Furosemide 60 mg IV daily, may be able to transition back to home oral Lasix after tomorrow's dose -K supplement -Strict I/Os, daily weights, telemetry  -Daily monitoring renal function     Obesity (BMI 30-39.9) BMI 30.1  DM (diabetes mellitus), type 2 (HCC) Glucose controlled - Hold metformin - Sliding scale corrections  Dyslipidemia - Continue atorvastatin  Essential hypertension Blood pressure controlled - Continue Entresto, metoprolol  Gastric cancer (HCC)    Persistent atrial fibrillation (HCC) Rate controlled - Continue Xarelto, Betapace, metoprolol  Coronary artery disease involving native coronary artery without angina pectoris - Continue atorvastatin and metoprolol  NICM (nonischemic cardiomyopathy) ([place]) - Continue Entresto - Hold Jardiance         Advance Care Planning: Full code, presumed  Consults: None needed  Family Communication: Wife at the bedside  Severity of Illness: The appropriate patient status for this patient is OBSERVATION.  Observation status is judged to be reasonable and necessary in order to provide the required intensity of service to ensure the patient's safety. The patient's presenting symptoms, physical exam findings, and initial radiographic and laboratory data in the context of their medical condition is felt to place them at decreased risk for further clinical deterioration. Furthermore, it is anticipated that the patient will be medically stable for discharge from the hospital within 2 midnights of admission.   Author: Edwin Dada, MD 12/25/2021 6:38 PM  For on call review [email or web address].

## 2021-12-26 ENCOUNTER — Encounter (HOSPITAL_COMMUNITY): Payer: Self-pay | Admitting: Family Medicine

## 2021-12-26 DIAGNOSIS — Z87892 Personal history of anaphylaxis: Secondary | ICD-10-CM | POA: Diagnosis not present

## 2021-12-26 DIAGNOSIS — M1990 Unspecified osteoarthritis, unspecified site: Secondary | ICD-10-CM | POA: Diagnosis present

## 2021-12-26 DIAGNOSIS — I2510 Atherosclerotic heart disease of native coronary artery without angina pectoris: Secondary | ICD-10-CM | POA: Diagnosis present

## 2021-12-26 DIAGNOSIS — Z79899 Other long term (current) drug therapy: Secondary | ICD-10-CM | POA: Diagnosis not present

## 2021-12-26 DIAGNOSIS — I2729 Other secondary pulmonary hypertension: Secondary | ICD-10-CM | POA: Diagnosis present

## 2021-12-26 DIAGNOSIS — J189 Pneumonia, unspecified organism: Secondary | ICD-10-CM | POA: Diagnosis present

## 2021-12-26 DIAGNOSIS — E785 Hyperlipidemia, unspecified: Secondary | ICD-10-CM | POA: Diagnosis present

## 2021-12-26 DIAGNOSIS — C163 Malignant neoplasm of pyloric antrum: Secondary | ICD-10-CM | POA: Diagnosis not present

## 2021-12-26 DIAGNOSIS — Z7984 Long term (current) use of oral hypoglycemic drugs: Secondary | ICD-10-CM | POA: Diagnosis not present

## 2021-12-26 DIAGNOSIS — I428 Other cardiomyopathies: Secondary | ICD-10-CM | POA: Diagnosis present

## 2021-12-26 DIAGNOSIS — I5043 Acute on chronic combined systolic (congestive) and diastolic (congestive) heart failure: Secondary | ICD-10-CM | POA: Diagnosis present

## 2021-12-26 DIAGNOSIS — Z95810 Presence of automatic (implantable) cardiac defibrillator: Secondary | ICD-10-CM | POA: Diagnosis not present

## 2021-12-26 DIAGNOSIS — Z6829 Body mass index (BMI) 29.0-29.9, adult: Secondary | ICD-10-CM | POA: Diagnosis not present

## 2021-12-26 DIAGNOSIS — I4819 Other persistent atrial fibrillation: Secondary | ICD-10-CM | POA: Diagnosis present

## 2021-12-26 DIAGNOSIS — Z1152 Encounter for screening for COVID-19: Secondary | ICD-10-CM | POA: Diagnosis not present

## 2021-12-26 DIAGNOSIS — Z7901 Long term (current) use of anticoagulants: Secondary | ICD-10-CM | POA: Diagnosis not present

## 2021-12-26 DIAGNOSIS — Z96651 Presence of right artificial knee joint: Secondary | ICD-10-CM | POA: Diagnosis present

## 2021-12-26 DIAGNOSIS — Z8546 Personal history of malignant neoplasm of prostate: Secondary | ICD-10-CM | POA: Diagnosis not present

## 2021-12-26 DIAGNOSIS — Z882 Allergy status to sulfonamides status: Secondary | ICD-10-CM | POA: Diagnosis not present

## 2021-12-26 DIAGNOSIS — Z886 Allergy status to analgesic agent status: Secondary | ICD-10-CM | POA: Diagnosis not present

## 2021-12-26 DIAGNOSIS — E119 Type 2 diabetes mellitus without complications: Secondary | ICD-10-CM | POA: Diagnosis present

## 2021-12-26 DIAGNOSIS — K5730 Diverticulosis of large intestine without perforation or abscess without bleeding: Secondary | ICD-10-CM | POA: Diagnosis present

## 2021-12-26 DIAGNOSIS — E669 Obesity, unspecified: Secondary | ICD-10-CM | POA: Diagnosis present

## 2021-12-26 DIAGNOSIS — Z9221 Personal history of antineoplastic chemotherapy: Secondary | ICD-10-CM | POA: Diagnosis not present

## 2021-12-26 DIAGNOSIS — I110 Hypertensive heart disease with heart failure: Secondary | ICD-10-CM | POA: Diagnosis present

## 2021-12-26 LAB — BASIC METABOLIC PANEL
Anion gap: 11 (ref 5–15)
BUN: 20 mg/dL (ref 8–23)
CO2: 21 mmol/L — ABNORMAL LOW (ref 22–32)
Calcium: 9.1 mg/dL (ref 8.9–10.3)
Chloride: 110 mmol/L (ref 98–111)
Creatinine, Ser: 1.12 mg/dL (ref 0.61–1.24)
GFR, Estimated: >60 mL/min (ref >60)
Glucose, Bld: 111 mg/dL — ABNORMAL HIGH (ref 70–99)
Potassium: 3.5 mmol/L (ref 3.5–5.1)
Sodium: 142 mmol/L (ref 135–145)

## 2021-12-26 LAB — GLUCOSE, CAPILLARY
Glucose-Capillary: 94 mg/dL (ref 70–99)
Glucose-Capillary: 108 mg/dL — ABNORMAL HIGH (ref 70–99)
Glucose-Capillary: 104 mg/dL — ABNORMAL HIGH (ref 70–99)
Glucose-Capillary: 121 mg/dL — ABNORMAL HIGH (ref 70–99)

## 2021-12-26 LAB — CBC
HCT: 38.6 % — ABNORMAL LOW (ref 39.0–52.0)
Hemoglobin: 12.8 g/dL — ABNORMAL LOW (ref 13.0–17.0)
MCH: 32.7 pg (ref 26.0–34.0)
MCHC: 33.2 g/dL (ref 30.0–36.0)
MCV: 98.5 fL (ref 80.0–100.0)
Platelets: 168 10*3/uL (ref 150–400)
RBC: 3.92 MIL/uL — ABNORMAL LOW (ref 4.22–5.81)
RDW: 13.8 % (ref 11.5–15.5)
WBC: 5.9 10*3/uL (ref 4.0–10.5)
nRBC: 0 % (ref 0.0–0.2)

## 2021-12-26 LAB — HEMOGLOBIN A1C
Hgb A1c MFr Bld: 6.1 % — ABNORMAL HIGH (ref 4.8–5.6)
Mean Plasma Glucose: 128.37 mg/dL

## 2021-12-26 LAB — MAGNESIUM: Magnesium: 2 mg/dL (ref 1.7–2.4)

## 2021-12-26 MED ORDER — SPIRONOLACTONE 25 MG PO TABS
25.0000 mg | ORAL_TABLET | Freq: Every day | ORAL | Status: DC
  Administered 2021-12-26 – 2021-12-27 (×2): 25 mg via ORAL
  Filled 2021-12-26 (×2): qty 1

## 2021-12-26 MED ORDER — FUROSEMIDE 40 MG PO TABS
40.0000 mg | ORAL_TABLET | Freq: Every day | ORAL | Status: DC
  Administered 2021-12-27: 40 mg via ORAL
  Filled 2021-12-26: qty 1

## 2021-12-26 NOTE — Progress Notes (Signed)
   12/26/21 1500  Mobility  Activity Ambulated with assistance in hallway  Level of Assistance Contact guard assist, steadying assist  Assistive Device Front wheel walker  Distance Ambulated (ft) 200 ft  Activity Response Tolerated well  Mobility Referral Yes  $Mobility charge 1 Mobility   Mobility Specialist Progress Note  Pre-Mobility: 79HR During Mobility: 98-140HR Post-Mobility: 65HR  Pt was in bed and agreeable. X1 standing break d/t to increasing HR. Returned to chair w/ all needs met and call bell in reach  Lucious Groves Mobility Specialist

## 2021-12-26 NOTE — Progress Notes (Signed)
PROGRESS NOTE  Jon Williams  DOB: 08/06/1950  PCP: [name], MD RXV:[id number]  DOA: 12/25/2021  LOS: 0 days  Hospital Day: 2  Brief narrative: Jon Williams is a 71 y.o. male with PMH significant for gastric CA dx'd 10/22 s/p chemo, chronic combined systolic and diastolic CHF EF 50-93%, nonischemic cardiomyopathy hx AICD and PPM, DM, CAD, persAF on Xarelto and Betapace, and obesity, prostate cancer. 11/13, patient presented to ED with complaint of few days of productive cough  In the ED, patient was afebrile, hemodynamically stable, breathing on room air chest x-ray showed right lower lobe opacity, pleural effusion, cardiomegaly.   Labs showed elevated BNP to 2219.   Patient was given Lasix, Rocephin, azithromycin  Admitted to [facility] for CHF and pneumonia.   Subjective: Patient was seen and examined this morning.  Pleasant elderly African-American male.  Sitting up in recliner.  Not in distress.  No new symptoms. Chart reviewed Remains afebrile, hemodynamically stable. Repeat labs this morning show stable CBC, BMP  Assessment and plan: Community acquired pneumonia of right lower lobe of lung Presented with cough, pleuritic chest discomfort, and thick sputum.   Chest x-ray shows right lower lobe opacity with pleural effusion.   Continue Rocephin and azithromycin Continue aggressive pulmonary toilet, flutter and incentive spirometer Recent Labs  Lab 12/25/21 1145 12/26/21 0049  WBC 6.6 5.9   Acute on chronic combined systolic and diastolic CHF Nonischemic cardiomyopathy s/p AICD Essential hypertension Reports dyspnea on exertion, noted to have elevated BNP, bilateral pleural and cardiomegaly  Echo 3 months ago showed EF 20 to 25%, mitral regurgitation, grade 3 diastolic dysfunction. PTA on Toprol 25 mg daily, Lasix 20 mg daily, Entresto 24-26 mg twice daily, Jardiance 10 mg daily Currently on diuresis with Lasix 60 mg IV daily.  Continue Toprol, Entresto.   Jardiance currently on hold. Since his blood pressure and renal function are stable, will add Aldactone 25 mg daily. I will order to switch to oral Lasix 40 mg daily tomorrow morning. Net IO Since Admission: -2,572.95 mL [12/26/21 1122] Continue to monitor for daily intake output, weight, blood pressure, BNP, renal function and electrolytes. Recent Labs  Lab 12/25/21 1145 12/26/21 0045 12/26/21 0049  BNP 2,219.4*  --   --   BUN 19  --  20  CREATININE 1.07  --  1.12  K 3.9  --  3.5  MG  --  2.0  --    Persistent atrial fibrillation  PTA on sotalol 120 mg twice daily, Toprol 25 mg daily.  Continue same Continue anticoagulation Xarelto.  CAD/HLD Continue Xarelto and atorvastatin  Type 2 diabetes mellitus A1c 6.1 on 12/27/2018 PTA on metformin 500 mg daily, Jardiance 10 mg daily which are currently on hold Continue sliding scale insulin with Accu-Cheks Recent Labs  Lab 12/25/21 1854 12/25/21 2137 12/26/21 0619 12/26/21 1115  GLUCAP 113* 141* 94 108*    Gastric cancer  dx'd 10/22 s/p chemo  Morbid obesity  Body mass index is 29.75 kg/m. Patient has been advised to make an attempt to improve diet and exercise patterns to aid in weight loss.    Goals of care   Code Status: Full Code    Mobility: Encourage ambulation.  Uses a walker at home.  Check amatoxin requirement  Skin assessment:     Nutritional status:  Body mass index is 29.75 kg/m.          Diet:  Diet Order  Diet heart healthy/carb modified Room service appropriate? Yes; Fluid consistency: Thin  Diet effective now                   DVT prophylaxis:   rivaroxaban (XARELTO) tablet 20 mg   Antimicrobials: Rocephin, azithromycin Fluid: None  Consultants: None at this time Family Communication: Wife at bedside  Status is: Observation  Continue in-hospital care because: Needs IV Lasix, IV antibiotics Level of care: Telemetry Cardiac   Dispo: The patient is from: Home               Anticipated d/c is to: Hopefully home tomorrow              Patient currently is not medically stable to d/c.   Difficult to place patient No     Infusions:   cefTRIAXone (ROCEPHIN)  IV      Scheduled Meds:  atorvastatin  40 mg Oral Daily   doxycycline  100 mg Oral BID   [START ON 12/27/2021] furosemide  40 mg Oral Daily   gabapentin  100 mg Oral BID   insulin aspart  0-15 Units Subcutaneous TID WC   insulin aspart  0-5 Units Subcutaneous QHS   metoprolol succinate  25 mg Oral Daily   pantoprazole  40 mg Oral BID   potassium chloride  10 mEq Oral BID   rivaroxaban  20 mg Oral Q supper   sacubitril-valsartan  1 tablet Oral BID   sotalol  120 mg Oral BID   spironolactone  25 mg Oral Daily    PRN meds: acetaminophen **OR** acetaminophen, ondansetron **OR** ondansetron (ZOFRAN) IV   Antimicrobials: Anti-infectives (From admission, onward)    Start     Dose/Rate Route Frequency Ordered Stop   12/26/21 1800  doxycycline (VIBRA-TABS) tablet 100 mg        100 mg Oral 2 times daily 12/25/21 1918 12/29/21 2359   12/26/21 1200  cefTRIAXone (ROCEPHIN) 2 g in sodium chloride 0.9 % 100 mL IVPB        2 g 200 mL/hr over 30 Minutes Intravenous Every 24 hours 12/25/21 1825 12/30/21 1159   12/26/21 1000  azithromycin (ZITHROMAX) tablet 500 mg  Status:  Discontinued        500 mg Oral Daily 12/25/21 1825 12/25/21 1918   12/25/21 1530  cefTRIAXone (ROCEPHIN) 1 g in sodium chloride 0.9 % 100 mL IVPB        1 g 200 mL/hr over 30 Minutes Intravenous  Once 12/25/21 1526 12/25/21 1624   12/25/21 1530  azithromycin (ZITHROMAX) 500 mg in sodium chloride 0.9 % 250 mL IVPB        500 mg 250 mL/hr over 60 Minutes Intravenous  Once 12/25/21 1526 12/25/21 1751       Objective: Vitals:   12/26/21 0755 12/26/21 1102  BP: 127/88 116/86  Pulse: 66 66  Resp: 19 20  Temp: 98.1 F (36.7 C) (!) 97.4 F (36.3 C)  SpO2: 97% 100%    Intake/Output Summary (Last 24 hours) at 12/26/2021  1122 Last data filed at 12/26/2021 1113 Gross per 24 hour  Intake 827.05 ml  Output 3400 ml  Net -2572.95 ml   Filed Weights   12/25/21 1822 12/26/21 0359  Weight: 84.7 kg 83.6 kg   Weight change:  Body mass index is 29.75 kg/m.   Physical Exam: General exam: Pleasant, elderly African-American male.  Not in physical distress Skin: No rashes, lesions or ulcers. HEENT: Atraumatic, normocephalic, no obvious bleeding Lungs:  Diminished air entry in both bases CVS: paced rhythm GI/Abd soft, nontender, nondistended, bowel sound present CNS: Alert, awake, oriented x3 Psychiatry: Mood appropriate Extremities: Improving bilateral pedal edema.  Data Review: I have personally reviewed the laboratory data and studies available.  F/u labs ordered Unresulted Labs (From admission, onward)     Start     Ordered   12/27/21 0500  Magnesium  Tomorrow morning,   R        12/26/21 0845            Signed, Terrilee Croak, MD [facility] 12/26/2021

## 2021-12-26 NOTE — Progress Notes (Signed)
   Heart Failure Stewardship Pharmacist Progress Note   PCP: Jon Carol, MD PCP-Cardiologist: Jon Moores, MD    HPI:  Jon Williams is a 71 year old male with PMH of of CHF s/p ICD, persistent atrial fibrillation, prostate cancer, gastric cancer, hypertension, dyslipidemia, diabetes mellitus, and CAD who presented after several days of cough. Left heart cath in 2018 showed non-obstructive CAD. Jon Williams is followed for his CHF outpatient by Jon Williams. Echocardiogram on 09/13/21 showed LVEF of 20 to 25%, with mitral regurgitation and grade 3 diastolic dysfunction. Patient was diagnosed with CAP and acute HF exacerbation.   Current HF Medications: Diuretic: Lasix 60 mg IV QD Beta Blocker: Metoprolol XL 25 mg QD ACE/ARB/ARNI: Entresto 24-26 mg BID  Prior to admission HF Medications: Diuretic: Lasix 20 mg daily Beta blocker: Toprol XL 25 mg daily (also taking sotalol) ACE/ARB/ARNI: Entresto 24-26 mg twice daily SGLT2i: Jardiance 10 mg daily  Pertinent Lab Values: Serum creatinine 1.12, BUN 20, Potassium 3.5, Sodium 142, BNP 2219.4, Magnesium not measured, A1c 6.1  Vital Signs: Weight: 184.3 lbs (admission weight: 186.7 lbs) Blood pressure: 100-120/80s  Heart rate: 60-70s V-paced  I/O: net -1.9L yesterday; net -1.9L  Medication Assistance / Insurance Benefits Check: Does the patient have prescription insurance?  Yes Type of insurance plan: Medicare  Outpatient Pharmacy:  Prior to admission outpatient pharmacy: [facility] Is the patient willing to use [facility] at discharge? Yes Is the patient willing to transition their outpatient pharmacy to utilize a [facility] outpatient pharmacy?   Pending    Assessment: 1. Acute on chronic combined systolic and diastolic CHF (LVEF 33-29%), due to non-ICM. NYHA class II-III symptoms. -  Maintain strict I/Os, daily weights, Mg >2, and K >4. Spironolactone could help maintain normokalemia. Okay to continue scheduled 10 mg BID  today, can reevaluate K in the AM. - BP and creatinine are stable. Patient would benefit from restarting PTA SGLT2i. Can consider titrating Entresto if BP is stable after spirolactone is added. - Euvolemic on exam, symptoms through to be secondary to PNA. Will continue to monitor.   Plan: 1) Medication changes recommended at this time: - Add spironolactone 25 mg daily - Consider resuming PTA Jardiance 10 mg daily  2) Patient assistance: - none  3)  Education  - To be completed prior to discharge  Thank you for allowing pharmacy to participate in this patient's care.  Jon Williams, PharmD PGY2 Pharmacy Resident 12/26/2021 8:53 AM Check [email or web address] for unit specific pharmacy number

## 2021-12-26 NOTE — Progress Notes (Signed)
Heart Failure Nurse Navigator Progress Note  PCP: [name], MD PCP-Cardiologist: [name] Admission Diagnosis: Acute on chronic congestive heart failure, Community acquired pneumonia of right lower lobe.  Admitted from: Home  Presentation:   Jon Williams presented with cough, congestion and chest pain with shortness of breath, gained 3 lbs in 2 days, +1 edema to bilateral lower extremities.  Has been compliant with all medications. BP 116/93, HR 61, BNP 2,219,  IV lasix and Zithromax given. CXR shows pulmonary edema, and opacification and effusion concerning for pneumonia.   Patient and his wife were educated on the sign and symptoms of heart failure, daily weights ( have a daily weight/ BP book they keep with them) Diet/ fluid restrictions, taking all medications as prescribed and attending all medical appointments. Both patient and wife voiced their understanding of education. A hospital HF TOC appointment was scheduled for 01/09/2022 @ 10 am.   ECHO/ LVEF: 20-25% G3DD  Clinical Course:  Past Medical History:  Diagnosis Date   AICD (automatic cardioverter/defibrillator) present 05/25/2016   biv icd   Bunion, right    Chronic combined systolic and diastolic CHF (congestive heart failure) ([place])    Echo 1/18: Mild conc LVH, EF 15-20, severe diff HK, inf and inf-septal AK, Gr 3 DD, mild to mod MR, severe LAE, mod reduced RVSF, mod RAE, mild TR, PASP 50   Coronary artery disease involving native coronary artery without angina pectoris 04/17/2016   LHC 1/18: pLCx 55, mLCx 20, mRCA 40, dRCA 20, LVEDP 23, mean RA 8, PA 42/20, PCWP 17   Diabetes mellitus without complication (HCC)    DJD (degenerative joint disease)    Gastric cancer (HCC)    History of atrial fibrillation    History of atrial flutter    History of cardiomegaly 06/07/2016   Noted on CXR   History of colon polyps 06/28/2017   Noted on colonoscopy   LBBB (left bundle branch block)    NICM (nonischemic  cardiomyopathy) ([place])    Echo 1/18:  Mild conc LVH, EF 15-20, severe diff HK, inf and inf-septal AK, Gr 3 DD, mild to mod MR, severe LAE, mod reduced RVSF, mod RAE, mild TR, PASP 50   OA (osteoarthritis)    knee   Other secondary pulmonary hypertension ([place]) 04/17/2016   Prostate cancer ([place]) 2019   Sigmoid diverticulosis 06/28/2017   Noted on colonoscopy     Social History   Socioeconomic History   Marital status: Married    Spouse name: Not on file   Number of children: 2   Years of education: Not on file   Highest education level: Not on file  Occupational History   Occupation: retired  Tobacco Use   Smoking status: Never   Smokeless tobacco: Never  Vaping Use   Vaping Use: Never used  Substance and Sexual Activity   Alcohol use: No   Drug use: No   Sexual activity: Not Currently  Other Topics Concern   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 Daughter, Beckie Busing, lives in [place]. Son, Wallace, lives in [place].   Social Determinants of Health   Financial Resource Strain: Low Risk  (12/26/2021)   Overall Financial Resource Strain (CARDIA)    Difficulty of Paying Living Expenses: Not very hard  Food Insecurity: No Food Insecurity (12/25/2021)   Hunger Vital Sign    Worried About Running Out of Food in the Last Year: Never true  Ran Out of Food in the Last Year: Never true  Transportation Needs: No Transportation Needs (12/26/2021)   PRAPARE - Hydrologist (Medical): No    Lack of Transportation (Non-Medical): No  Physical Activity: Insufficiently Active (09/26/2021)   Exercise Vital Sign    Days of Exercise per Week: 7 days    Minutes of Exercise per Session: 10 min  Stress: No Stress Concern Present (09/26/2021)   [place]    Feeling of Stress : Not at all  Social Connections: Not on file   Education  Assessment and Provision:  Detailed education and instructions provided on heart failure disease management including the following:  Signs and symptoms of Heart Failure When to call the physician Importance of daily weights Low sodium diet Fluid restriction Medication management Anticipated future follow-up appointments  Patient education given on each of the above topics.  Patient acknowledges understanding via teach back method and acceptance of all instructions.  Education Materials:  "Living Better With Heart Failure" Booklet, HF zone tool, & Daily Weight Tracker Tool.  Patient has scale at home: yes Patient has pill box at home: yes    High Risk Criteria for Readmission and/or Poor Patient Outcomes: Heart failure hospital admissions (last 6 months): 1  No Show rate: 1 % Difficult social situation: No Demonstrates medication adherence: Yes Primary Language: english Literacy level: Reading, writing, and comprehension  Barriers of Care:   Diet/ fluid restrictions Continued HF education   Considerations/Referrals:   Referral made to Heart Failure Pharmacist Stewardship: yes Referral made to Heart Failure CSW/NCM TOC: No Referral made to Heart & Vascular TOC clinic: Yes, 01/09/22 @ 10 am.   Items for Follow-up on DC/TOC: Diet/ fluid restrictions Continued HF education   Earnestine Leys, BSN, RN Heart Failure Leisure centre manager Chat Only

## 2021-12-26 NOTE — TOC Progression Note (Signed)
Transition of Care [facility]) - Progression Note    Patient Details  Name: Jon Williams MRN: [id number] Date of Birth: [birth year]  Transition of [facility]) CM/SW Contact  Zenon Mayo, RN Phone Number: 12/26/2021, 4:19 PM  Clinical Narrative:     from home with wife, CAP, chest pain, sob, will medically manage, conts on iv abx, wife to transport home.  TOC following.        Expected Discharge Plan and Services                                                 Social Determinants of Health (SDOH) Interventions Transportation Interventions: Intervention Not Indicated Financial Strain Interventions: Intervention Not Indicated  Readmission Risk Interventions    09/15/2021    2:29 PM 04/04/2021   12:46 PM  Readmission Risk Prevention Plan  Transportation Screening Complete Complete  PCP or Specialist Appt within 3-5 Days Complete   HRI or [place] Complete   Social Work Consult for [place] Planning/Counseling Complete   Palliative Care Screening Not Applicable   Medication Review Press photographer) Complete Complete  PCP or Specialist appointment within 3-5 days of discharge  Complete  HRI or [place]  Complete  SW Recovery Care/Counseling Consult  Complete  [place]  Not Applicable

## 2021-12-27 DIAGNOSIS — I428 Other cardiomyopathies: Secondary | ICD-10-CM

## 2021-12-27 DIAGNOSIS — E669 Obesity, unspecified: Secondary | ICD-10-CM | POA: Diagnosis not present

## 2021-12-27 DIAGNOSIS — I4819 Other persistent atrial fibrillation: Secondary | ICD-10-CM

## 2021-12-27 DIAGNOSIS — J189 Pneumonia, unspecified organism: Secondary | ICD-10-CM | POA: Diagnosis not present

## 2021-12-27 DIAGNOSIS — C163 Malignant neoplasm of pyloric antrum: Secondary | ICD-10-CM | POA: Diagnosis not present

## 2021-12-27 LAB — BASIC METABOLIC PANEL
Anion gap: 14 (ref 5–15)
BUN: 20 mg/dL (ref 8–23)
CO2: 21 mmol/L — ABNORMAL LOW (ref 22–32)
Calcium: 8.8 mg/dL — ABNORMAL LOW (ref 8.9–10.3)
Chloride: 108 mmol/L (ref 98–111)
Creatinine, Ser: 1.01 mg/dL (ref 0.61–1.24)
GFR, Estimated: >60 mL/min (ref >60)
Glucose, Bld: 115 mg/dL — ABNORMAL HIGH (ref 70–99)
Potassium: 3.7 mmol/L (ref 3.5–5.1)
Sodium: 143 mmol/L (ref 135–145)

## 2021-12-27 LAB — GLUCOSE, CAPILLARY: Glucose-Capillary: 98 mg/dL (ref 70–99)

## 2021-12-27 LAB — MAGNESIUM: Magnesium: 1.9 mg/dL (ref 1.7–2.4)

## 2021-12-27 MED ORDER — FUROSEMIDE 40 MG PO TABS: 40.0000 mg | ORAL_TABLET | Freq: Every day | ORAL | 2 refills | Status: DC

## 2021-12-27 MED ORDER — SPIRONOLACTONE 25 MG PO TABS: 25.0000 mg | ORAL_TABLET | Freq: Every day | ORAL | 2 refills | Status: DC

## 2021-12-27 MED ORDER — CEFDINIR 300 MG PO CAPS: 300.0000 mg | ORAL_CAPSULE | Freq: Two times a day (BID) | ORAL | 0 refills | Status: AC

## 2021-12-27 MED ORDER — DOXYCYCLINE HYCLATE 100 MG PO TABS: 100.0000 mg | ORAL_TABLET | Freq: Two times a day (BID) | ORAL | 0 refills | Status: AC

## 2021-12-27 NOTE — TOC Initial Note (Signed)
Transition of Care [facility]) - Initial/Assessment Note    Patient Details  Name: Jon Williams MRN: [id number] Date of Birth: [birth year]  Transition of Care [facility]) CM/SW Contact:    Zenon Mayo, RN Phone Number: 12/27/2021, 10:52 AM  Clinical Narrative:                 Patient from home with wife, for dc today, he has a walker at home. Indep.  He as a scale, he weighs himself daily and checks bp daily and he eats a no salt diet.  Wife will transport him home today.   Expected Discharge Plan: Home/Self Care Barriers to Discharge: No Barriers Identified   Patient Goals and CMS Choice Patient states their goals for this hospitalization and ongoing recovery are:: return home   Choice offered to / list presented to : NA  Expected Discharge Plan and Services Expected Discharge Plan: Home/Self Care In-house Referral: NA Discharge Planning Services: CM Consult Post Acute Care Choice: NA Living arrangements for the past 2 months: Single Family Home Expected Discharge Date: 12/27/21               DME Arranged: N/A DME Agency: NA       HH Arranged: NA          Prior Living Arrangements/Services Living arrangements for the past 2 months: Single Family Home Lives with:: Spouse Patient language and need for interpreter reviewed:: Yes Do you feel safe going back to the place where you live?: Yes      Need for Family Participation in Patient Care: Yes (Comment) Care giver support system in place?: Yes (comment) Current home services: DME (walker) Criminal Activity/Legal Involvement Pertinent to Current Situation/Hospitalization: No - Comment as needed  Activities of Daily Living Home Assistive Devices/Equipment: None ADL Screening (condition at time of admission) Patient's cognitive ability adequate to safely complete daily activities?: Yes Is the patient deaf or have difficulty hearing?: No Does the patient have difficulty seeing, even when wearing glasses/contacts?:  No Does the patient have difficulty concentrating, remembering, or making decisions?: No Patient able to express need for assistance with ADLs?: Yes Does the patient have difficulty dressing or bathing?: No Independently performs ADLs?: Yes (appropriate for developmental age) Does the patient have difficulty walking or climbing stairs?: No Weakness of Legs: None Weakness of Arms/Hands: None  Permission Sought/Granted                  Emotional Assessment Appearance:: Appears stated age Attitude/Demeanor/Rapport: Engaged Affect (typically observed): Appropriate Orientation: : Oriented to Self, Oriented to Place, Oriented to  Time, Oriented to Situation Alcohol / Substance Use: Not Applicable Psych Involvement: No (comment)  Admission diagnosis:  Acute on chronic combined systolic and diastolic CHF (congestive heart failure) ([place]) [I50.43] Patient Active Problem List   Diagnosis Date Noted   Obesity (BMI 30-39.9) 12/25/2021   Community acquired pneumonia of right lower lobe of lung 12/25/2021   Malnutrition of moderate degree 09/15/2021   DM (diabetes mellitus), type 2 ([name]) 09/14/2021   Essential hypertension 09/13/2021   Dyslipidemia 09/13/2021   DNR (do not resuscitate) 09/13/2021   Port-A-Cath in place 06/15/2021   Hypotension due to hypovolemia 04/04/2021   Nausea vomiting and diarrhea 04/04/2021   Thrombocytopenia ([name]) 04/04/2021   Leukocytosis 04/04/2021   Acute renal failure superimposed on stage 3a chronic kidney disease ([place]) 04/04/2021   Diarrhea 04/01/2021   Occult blood in stools 04/01/2021   Foot ulcer, right ([place]) 04/01/2021   Hypotension 04/01/2021  Dehydration 04/01/2021   Iron deficiency anemia due to chronic blood loss 12/12/2020   Gastric cancer ([name]) 12/08/2020   Symptomatic anemia 11/30/2020   Gastrointestinal hemorrhage    Corns and callosities 08/07/2019   Blood clotting disorder ([place]) 05/06/2019   Coagulation defect ([place]) 05/06/2019    Pacemaker lead malfunction 10/10/2018   Pacemaker complications 25/85/2778   Pacemaker lead failure, sequela 10/10/2018   S/P knee replacement 08/16/2017   S/P total knee arthroplasty 08/16/2017   Malignant neoplasm of prostate ([place]) 04/02/2017   ICD (implantable cardioverter-defibrillator) in place    Persistent atrial fibrillation ([name]) 05/25/2016   Coronary artery disease involving native coronary artery without angina pectoris 04/17/2016   Other secondary pulmonary hypertension ([place]) 04/17/2016   Acute on chronic combined systolic and diastolic CHF (congestive heart failure) ([name])    NICM (nonischemic cardiomyopathy) ([name])    LBBB (left bundle branch block)    PCP:  [name], MD Pharmacy:   [facility] [place], [place] [address] Phone: [phone] Fax: [phone]  [id number] - [place], [address] [place] [address] Phone: [phone] Fax: [phone]  [facility] [address] [place] [address] Phone: [phone] Fax: [phone]  [place] [address] [place] [address] [place] [address] Phone: [phone] Fax: [phone]  [facility], [place] - [address] [place] [address] Phone: [phone] Fax: [phone]     Social Determinants of Health ([place]) Interventions Transportation Interventions: Intervention Not Indicated Financial Strain Interventions: Intervention Not Indicated  Readmission Risk Interventions    12/27/2021   10:50 AM 09/15/2021    2:29 PM 04/04/2021   12:46 PM  Readmission Risk Prevention Plan  Transportation Screening Complete Complete Complete  PCP or Specialist Appt within 3-5 Days Complete Complete    HRI or [place] Complete Complete   Social Work Consult for [place] Planning/Counseling Complete Complete   Palliative Care Screening Not Applicable Not Applicable   Medication Review Press photographer) Complete Complete Complete  PCP or Specialist appointment within 3-5 days of discharge   Complete  HRI or [place]   Complete  SW Recovery Care/Counseling Consult   Complete  [place]   Not Applicable

## 2021-12-27 NOTE — Progress Notes (Addendum)
   Heart Failure Stewardship Pharmacist Progress Note   PCP: [name], MD PCP-Cardiologist: Mertie Moores, MD    HPI:  Jon Williams is a 71 year old male with PMH of of CHF s/p ICD, persistent atrial fibrillation, prostate cancer, gastric cancer, hypertension, dyslipidemia, diabetes mellitus, and CAD who presented after several days of cough. Left heart cath in 2018 showed non-obstructive CAD. Jon Williams is followed for his CHF outpatient by Dr. [name]. Echocardiogram on 09/13/21 showed LVEF of 20 to 25%, with mitral regurgitation and grade 3 diastolic dysfunction. Patient was diagnosed with CAP and acute HF exacerbation.   Current HF Medications: Diuretic: Lasix 60 mg IV QD Beta Blocker: Metoprolol XL 25 mg QD ACE/ARB/ARNI: Entresto 24-26 mg BID MRA: Spironolactone 25 mg QD  Prior to admission HF Medications: Diuretic: Lasix 20 mg daily Beta blocker: Toprol XL 25 mg daily (also taking sotalol) ACE/ARB/ARNI: Entresto 24-26 mg twice daily SGLT2i: Jardiance 10 mg daily  Pertinent Lab Values: Serum creatinine 1.01, BUN 20, Potassium 3.7, Sodium 143, BNP 2219.4, Magnesium 1.9, A1c 6.1  Vital Signs: Weight: 183.6 lbs (admission weight: 186.7 lbs) Blood pressure: 100/80s  Heart rate: 60-70s V-paced  I/O: net -1.6L yesterday; net -3.8L this admission  Medication Assistance / Insurance Benefits Check: Does the patient have prescription insurance?  Yes Type of insurance plan: Medicare  Outpatient Pharmacy:  Prior to admission outpatient pharmacy: [facility] Is the patient willing to use [facility] at discharge? Yes Is the patient willing to transition their outpatient pharmacy to utilize a [facility] outpatient pharmacy?   Pending    Assessment: 1. Acute on chronic combined systolic and diastolic CHF (LVEF 67-59%), due to non-ICM. NYHA class II-III symptoms. -  Maintain strict I/Os, daily weights, Mg >2, and K >4. Okay to continue scheduled 10 meq of K BID for now. -  BP and creatinine are stable. Patient would benefit from restarting PTA SGLT2i to help augment diuresis. Can consider titrating Entresto if BP is stable. - Mild peripheral edema on exam, respiratory symptoms through to be secondary to PNA. Yellow-brown mucous present.    Plan: 1) Medication changes recommended at this time: - Resume PTA Jardiance 10 mg daily - Supplement Mg 1g IV and continue K 10 meq PO BID  2) Patient assistance: - none  3)  Education  - Patient has been educated on current HF medications and potential additions to HF medication regimen - Patient verbalizes understanding that over the next few months, these medication doses may change and more medications may be added to optimize HF regimen - Patient has been educated on basic disease state pathophysiology and goals of therapy   Thank you for allowing pharmacy to participate in this patient's care.  Reatha Harps, PharmD PGY2 Pharmacy Resident 12/27/2021 6:46 AM Check [email or web address] for unit specific pharmacy number

## 2021-12-27 NOTE — TOC Transition Note (Addendum)
Transition of Care [facility]) - CM/SW Discharge Note   Patient Details  Name: Jon Williams MRN: [id number] Date of Birth: 11/16/1950  Transition of [facility]) CM/SW Contact:  Zenon Mayo, RN Phone Number: 12/27/2021, 10:54 AM   Clinical Narrative:    For dc today, wife will transport home, Local pharmacy to fill meds.    Final next level of care: Home/Self Care Barriers to Discharge: No Barriers Identified   Patient Goals and CMS Choice Patient states their goals for this hospitalization and ongoing recovery are:: return home   Choice offered to / list presented to : NA  Discharge Placement                       Discharge Plan and Services In-house Referral: NA Discharge Planning Services: CM Consult Post Acute Care Choice: NA          DME Arranged: N/A DME Agency: NA       HH Arranged: NA          Social Determinants of Health (SDOH) Interventions Transportation Interventions: Intervention Not Indicated Financial Strain Interventions: Intervention Not Indicated   Readmission Risk Interventions    12/27/2021   10:50 AM 09/15/2021    2:29 PM 04/04/2021   12:46 PM  Readmission Risk Prevention Plan  Transportation Screening Complete Complete Complete  PCP or Specialist Appt within 3-5 Days Complete Complete   HRI or [place] Complete Complete   Social Work Consult for [place] Planning/Counseling Complete Complete   Palliative Care Screening Not Applicable Not Applicable   Medication Review Press photographer) Complete Complete Complete  PCP or Specialist appointment within 3-5 days of discharge   Complete  HRI or [place]   Complete  SW Recovery Care/Counseling Consult   Complete  [place]   Not Applicable

## 2021-12-27 NOTE — Discharge Summary (Signed)
Physician Discharge Summary   Patient: Jon Williams MRN: [id number] DOB: 1950-07-04  Admit date:     12/25/2021  Discharge date: 12/27/21  Discharge Physician: Jon Williams   PCP: Jon Carol, MD   Recommendations at discharge:  Repeat chest x-ray in 6 weeks to assure complete resolution of infiltrates Reassess blood pressure and adjust antihypertensive treatment as needed Repeat basic metabolic panel to follow twice in observation. Continue assisting patient with weight management. Make sure patient has follow-up with cardiology service as instructed.  Discharge Diagnoses: Principal Problem:   Community acquired pneumonia of right lower lobe of lung Active Problems:   Acute on chronic combined systolic and diastolic CHF (congestive heart failure) (HCC)   NICM (nonischemic cardiomyopathy) (HCC)   Coronary artery disease involving native coronary artery without angina pectoris   Persistent atrial fibrillation (HCC)   Gastric cancer (Jon Williams)   Essential hypertension   Dyslipidemia   DM (diabetes mellitus), type 2 (HCC)   Obesity (BMI 30-39.9)  Hospital Course: Jon Williams is a 71 y.o. M with gastric CA cT3N0M0 dx'd 10/22 s/p chemo, sCHF EF 20-25%, hx AICD and PPM, DM, CAD, persAF on Xarelto and Betapace, and obesity who presented with few days cough, sputum.  In the ER, noted to have chest x-ray with right lower lobe opacity, pleural effusion, cardiomegaly.  Also BNP 2219.  Given Lasix, Rocephin, azithromycin and admitted for pneumonia and CHF.  Assessment and Plan: * Community acquired pneumonia of right lower lobe of lung -Presented with cough, pleuritic chest discomfort, and thick sputum.  Chest x-ray shows right lower lobe opacity with pleural effusion.  PSI 123, risk class IV. - Excellent response to IV antibiotics; no having fever and not requiring oxygen supplementation at discharge. -Continue the use of flutter valve/incentive spirometer and complete oral antibiotic  therapy as prescribed -Repeat chest x-ray in 6 weeks to assure resolution of infiltrate -Patient has been advised to maintain adequate hydration.    Acute on chronic combined systolic and diastolic CHF (congestive heart failure) (HCC) -Echocardiogram 3 months ago showed EF 20 to 25%, mitral regurgitation, grade 3 diastolic dysfunction. -patient found with some dyspnea on exertion, cardiomegaly, elevated BNP, and bilateral opacities consistent with CHF exacerbation most likely from PNA. -Excellent response to IV diuresis -Patient has been educated about the importance of medication compliance, daily weights and low-sodium diet. -GDMT, adjusted dose of daily Lasix, spironolactone, Entresto, metoprolol and Jon Williams.   -outpatient follow-up with cardiology service as recommended.      Obesity (BMI 30-39.9) -Body mass index is 29.63 kg/m. -Low calorie diet, portion control and increase physical activity discussed with patient.  DM (diabetes mellitus), type 2 (Jon Williams) -Glucose controlled -Resume home hypoglycemic regimen -Patient advised to follow modified carbohydrate diet and to maintain adequate hydration.  Dyslipidemia - Continue atorvastatin -Heart healthy diet discussed with patient.  Essential hypertension -Blood pressure controlled -Continue current antihypertensive agent -Heart healthy diet discussed with patient.  Gastric cancer (Jon Williams) -s/p chemotherapy -Continue outpatient follow-up.  Persistent atrial fibrillation (HCC) -Rate controlled -Continue Xarelto, Sotalol and metoprolol -Continue outpatient follow-up with cardiology service.  Coronary artery disease involving native coronary artery without angina pectoris -Continue atorvastatin and metoprolol. -No chest pain.  NICM (nonischemic cardiomyopathy) (Jon Williams) - Continue outpatient follow-up with cardiology. -Continue GDMT as prescribed use; Entresto, Jon Williams, metoprolol and now spironolactone. -Low-sodium diet  and adequate hydration discussed with patient.   Consultants: None Procedures performed: See below for x-ray reports. Disposition: Home Diet recommendation: Low calorie diet, modified carbohydrates and low sodium  diet recommended.   DISCHARGE MEDICATION: Allergies as of 12/27/2021       Reactions   Aspirin Anaphylaxis, Hives   Sulfa Antibiotics Anaphylaxis, Hives, Swelling, Other (See Comments)   Swollen lips        Medication List     TAKE these medications    atorvastatin 40 MG tablet Commonly known as: LIPITOR Take 1 tablet (40 mg total) by mouth daily.   Biotin 1000 MCG tablet Take 1,000 mcg by mouth daily with breakfast.   cefdinir 300 MG capsule Commonly known as: OMNICEF Take 1 capsule (300 mg total) by mouth 2 (two) times daily for 7 days.   cholecalciferol 25 MCG (1000 UNIT) tablet Commonly known as: VITAMIN D3 Take 1,000 Units by mouth daily with breakfast.   doxycycline 100 MG tablet Commonly known as: VIBRA-TABS Take 1 tablet (100 mg total) by mouth 2 (two) times daily for 7 days.   empagliflozin 10 MG Tabs tablet Commonly known as: Jon Williams Take 1 tablet (10 mg total) by mouth daily.   Entresto 24-26 MG Generic drug: sacubitril-valsartan Take 1 tablet by mouth 2 (two) times daily. What changed: additional instructions   furosemide 40 MG tablet Commonly known as: LASIX Take 1 tablet (40 mg total) by mouth daily. What changed:  medication strength how much to take   gabapentin 100 MG capsule Commonly known as: NEURONTIN TAKE 1 CAPSULE(100 MG) BY MOUTH TWICE DAILY What changed: See the new instructions.   metFORMIN 500 MG 24 hr tablet Commonly known as: GLUCOPHAGE-XR Take 500 mg by mouth every evening.   metoprolol succinate 25 MG 24 hr tablet Commonly known as: TOPROL-XL TAKE 1 TABLET(25 MG) BY MOUTH DAILY What changed: See the new instructions.   multivitamin Tabs tablet Take 1 tablet by mouth daily with breakfast.    pantoprazole 40 MG tablet Commonly known as: PROTONIX Take 1 tablet (40 mg total) by mouth 2 (two) times daily.   potassium chloride 10 MEQ tablet Commonly known as: KLOR-CON Twice daily What changed:  how much to take how to take this when to take this   sotalol 80 MG tablet Commonly known as: BETAPACE TAKE 1 AND 1/2 TABLETS(120 MG) BY MOUTH IN THE MORNING AND AT BEDTIME What changed: See the new instructions.   spironolactone 25 MG tablet Commonly known as: ALDACTONE Take 1 tablet (25 mg total) by mouth daily.   Xarelto 20 MG Tabs tablet Generic drug: rivaroxaban TAKE 1 TABLET BY MOUTH EVERY DAY WITH SUPPER What changed: See the new instructions.        Follow-up Information     [facility]. Go in 13 day(s).   Specialty: Cardiology Why: Hospital follow up PLEASE bring a current medication list to appt. FREE valet parking, Entrance C, off [employer] information: [address] [id number] [place] [phone]        Jon Carol, MD. Schedule an appointment as soon as possible for a visit in 29 day(s).   Specialty: Internal Medicine Contact information: [address] [place] [address] [phone]                Discharge Exam: Danley Danker Weights   12/25/21 1822 12/26/21 0359 12/27/21 0611  Weight: 84.7 kg 83.6 kg 83.3 kg   General exam: Alert, awake, oriented x 3 Respiratory system: Improved air movement bilaterally; no using accessory muscles.  Good saturation on room air. Cardiovascular system: Rate controlled, no rubs,  no gallops, no JVD on exam. Gastrointestinal system: Abdomen is nondistended, soft and nontender. No organomegaly or masses felt. Normal bowel sounds heard. Central nervous system: Alert and oriented. No focal neurological deficits. Extremities: No cyanosis or clubbing. Skin: No petechiae. Psychiatry: Judgement and  insight appear normal. Mood & affect appropriate.    Condition at discharge: Stable and improved.  The results of significant diagnostics from this hospitalization (including imaging, microbiology, ancillary and laboratory) are listed below for reference.   Imaging Studies: DG Chest 2 View  Result Date: 12/25/2021 CLINICAL DATA:  Cough.  Chest pain. EXAM: CHEST - 2 VIEW COMPARISON:  Two-view chest x-ray 09/13/2021 FINDINGS: Heart is enlarged. Right subclavian Port-A-Cath is stable pace. Pacing wires are stable. Asymmetric right lower lobe airspace disease and effusion is present. Mild pulmonary vascular congestion is noted. Lungs are otherwise clear. IMPRESSION: 1. Asymmetric right lower lobe airspace disease and effusion concerning for pneumonia. 2. Cardiomegaly and mild pulmonary vascular congestion. Electronically Signed   By: San Morelle M.D.   On: 12/25/2021 11:34    Microbiology: Results for orders placed or performed during the hospital encounter of 12/25/21  Resp Panel by RT-PCR (Flu A&B, Covid) Anterior Nasal Swab     Status: None   Collection Time: 12/25/21 10:55 AM   Specimen: Anterior Nasal Swab  Result Value Ref Range Status   SARS Coronavirus 2 by RT PCR NEGATIVE NEGATIVE Final    Comment: (NOTE) SARS-CoV-2 target nucleic acids are NOT DETECTED.  The SARS-CoV-2 RNA is generally detectable in upper respiratory specimens during the acute phase of infection. The lowest concentration of SARS-CoV-2 viral copies this assay can detect is 138 copies/mL. A negative result does not preclude SARS-Cov-2 infection and should not be used as the sole basis for treatment or other patient management decisions. A negative result may occur with  improper specimen collection/handling, submission of specimen other than nasopharyngeal swab, presence of viral mutation(s) within the areas targeted by this assay, and inadequate number of viral copies(<138 copies/mL). A negative result  must be combined with clinical observations, patient history, and epidemiological information. The expected result is Negative.  Fact Sheet for Patients:  EntrepreneurPulse.com.au  Fact Sheet for Healthcare Providers:  IncredibleEmployment.be  This test is no t yet approved or cleared by the [place] FDA and  has been authorized for detection and/or diagnosis of SARS-CoV-2 by FDA under an Emergency Use Authorization (EUA). This EUA will remain  in effect (meaning this test can be used) for the duration of the COVID-19 declaration under Section 564(b)(1) of the Act, 21 U.S.C.section 360bbb-3(b)(1), unless the authorization is terminated  or revoked sooner.       Influenza A by PCR NEGATIVE NEGATIVE Final   Influenza B by PCR NEGATIVE NEGATIVE Final    Comment: (NOTE) The Xpert Xpress SARS-CoV-2/FLU/RSV plus assay is intended as an aid in the diagnosis of influenza from Nasopharyngeal swab specimens and should not be used as a sole basis for treatment. Nasal washings and aspirates are unacceptable for Xpert Xpress SARS-CoV-2/FLU/RSV testing.  Fact Sheet for Patients: EntrepreneurPulse.com.au  Fact Sheet for Healthcare Providers: IncredibleEmployment.be  This test is not yet approved or cleared by the [place] FDA and has been authorized for detection and/or diagnosis of SARS-CoV-2 by FDA under an Emergency Use Authorization (EUA). This EUA will remain in effect (meaning this test can be used) for the duration of the COVID-19 declaration under Section 564(b)(1) of the Act, 21 U.S.C. section 360bbb-3(b)(1), unless the authorization is terminated or  revoked.  Performed at [facility], [place] [address] [place], [place] [address]     Labs: CBC: Recent Labs  Lab 12/25/21 1145 12/26/21 0049  WBC 6.6 5.9  NEUTROABS 5.0  --   HGB 13.7 12.8*  HCT 41.6 38.6*  MCV 102.0* 98.5  PLT 173  022   Basic Metabolic Panel: Recent Labs  Lab 12/25/21 1145 12/26/21 0045 12/26/21 0049 12/27/21 0058  NA 143  --  142 143  K 3.9  --  3.5 3.7  CL 115*  --  110 108  CO2 18*  --  21* 21*  GLUCOSE 106*  --  111* 115*  BUN 19  --  20 20  CREATININE 1.07  --  1.12 1.01  CALCIUM 8.9  --  9.1 8.8*  MG  --  2.0  --  1.9   Liver Function Tests: No results for input(s): "AST", "ALT", "ALKPHOS", "BILITOT", "PROT", "ALBUMIN" in the last 168 hours. CBG: Recent Labs  Lab 12/26/21 0619 12/26/21 1115 12/26/21 1645 12/26/21 2123 12/27/21 0608  GLUCAP 94 108* 104* 121* 98    Discharge time spent: greater than 30 minutes.  Signed: Barton Dubois, MD [facility] 12/27/2021

## 2022-01-01 ENCOUNTER — Telehealth: Payer: Self-pay

## 2022-01-01 NOTE — Telephone Encounter (Signed)
Done and fax successful sheet received.

## 2022-01-01 NOTE — Telephone Encounter (Signed)
**Note De-Identified Shelbe Haglund Obfuscation** We received the providers page of a [employer] pt asst application for [facility] assistance from [place] @ [place] phone #: [phone]. No contact name/information included.  I completed the MD page and have e-mailed it to Dr Elmarie Shiley nurse so she can obtain his signature, date it, and to fax to [facility] at the fax number written on the cover letter included.

## 2022-01-02 DIAGNOSIS — E7800 Pure hypercholesterolemia, unspecified: Secondary | ICD-10-CM | POA: Diagnosis not present

## 2022-01-02 DIAGNOSIS — I428 Other cardiomyopathies: Secondary | ICD-10-CM | POA: Diagnosis not present

## 2022-01-02 DIAGNOSIS — C169 Malignant neoplasm of stomach, unspecified: Secondary | ICD-10-CM | POA: Diagnosis not present

## 2022-01-02 DIAGNOSIS — J189 Pneumonia, unspecified organism: Secondary | ICD-10-CM | POA: Diagnosis not present

## 2022-01-02 DIAGNOSIS — I2510 Atherosclerotic heart disease of native coronary artery without angina pectoris: Secondary | ICD-10-CM | POA: Diagnosis not present

## 2022-01-02 DIAGNOSIS — E1169 Type 2 diabetes mellitus with other specified complication: Secondary | ICD-10-CM | POA: Diagnosis not present

## 2022-01-08 ENCOUNTER — Ambulatory Visit (INDEPENDENT_AMBULATORY_CARE_PROVIDER_SITE_OTHER): Payer: Medicare HMO

## 2022-01-08 DIAGNOSIS — I428 Other cardiomyopathies: Secondary | ICD-10-CM

## 2022-01-08 NOTE — Progress Notes (Incomplete)
HEART & VASCULAR TRANSITION OF CARE NOTE     Referring Physician: Dr, Margaretann Loveless  Primary Care: [name], MD Primary Cardiologist: Mertie Moores, MD EP: Particia Lather   HPI: Referred to clinic by Dr. Margaretann Loveless for heart failure consultation.   71 y.o. male with a history of non-obstructive CAD on cardiac catheterization in 2018, non-ischemic cardiomyopathy/ chronic combined CHF with EF of 20-25% on Echo in 03/2021 s/p ICD in 05/2016, permanent atrial fibrillation/flutter on Xarelto, LBBB,  h/o gastric and prostate cancer, and GI bleed.   He was admitted on 09/13/2021 for acute on chronic CHF after presenting with chest pain and shortness of breath. Hs trop 5>>5. Ventricular-paced rhythm, underlying afib, rate controlled. Echo LVEF 20-25%, RV mildly reduced, GIIIDD, mod-severe MR and mild-mod TR. He was diuresed w/ IV Lasix and placed on GDMT. D/c wt 186 lb.   Seen in [facility] clinic for f/u 09/26/21. Volume slightly elevated. Instructed to increase lasix X 2 days.   Readmitted earlier this month with CAP and a/c CHF. Treated with IV abx and IV lasix. Discharge diuretic - 40 mg furosemide daily.  He is here today for hospital follow-up. Home weight at discharge 180 lb, down to 177 lb today. Taking all medications as prescribed and watches fluid/sodium intake closely. In warmer months he walks some outdoors with a walker, stays inside most of winter. Walks around house several times a day. Reports mobility is limited by peripheral neuropathy from prior chemotherapy. Denies dyspnea, orthopnea, PND or lower extremity edema. SBP has been averaging 90s  Cardiac Testing  Echocardiogram 09/13/2021: Impressions: 1. Left ventricular ejection fraction, by estimation, is 20 to 25%. The  left ventricle has severely decreased function. The left ventricle  demonstrates global hypokinesis. The left ventricular internal cavity size  was moderately dilated. Left  ventricular diastolic parameters are consistent  with Grade III diastolic  dysfunction (restrictive).   2. Right ventricular systolic function is mildly reduced. The right  ventricular size is normal. There is moderately elevated pulmonary artery  systolic pressure. The estimated right ventricular systolic pressure is  03.5 mmHg.   3. Left atrial size was severely dilated.   4. The mitral valve is abnormal. Moderate to severe mitral valve  regurgitation, likely functional mitral regurgitation with dilated  annulus. No evidence of mitral stenosis.   5. Tricuspid valve regurgitation is mild to moderate.   6. The aortic valve is tricuspid. Aortic valve regurgitation is not  visualized. No aortic stenosis is present.   7. The inferior vena cava is dilated in size with >50% respiratory  variability, suggesting right atrial pressure of 8 mmHg.    R/LHC 02/2016  Right Heart Pressures RA (mean): 8 mmHg RV (S/EDP): 40/12 mmHg PA (S/D, mean): 42/20, 29 mmHg PCWP (mean): 17 mmHg  Ao sat: 96% PA sat: 64%  Fick CO: 3.8 L/min Fick CI: 1.9 L/min/m^2   Mild to moderate, non-obstructive coronary artery disease consistent with non-ischemic cardiomyopathy. Upper normal to mildly elevated left and right heart filling pressures. 3.   Mild pulmonary hypertension. 4.   Reduced Fick cardiac output/index.   Review of Systems: Cardiac and Respiratory - Negative except as mentioned in HPI   Past Medical History:  Diagnosis Date   AICD (automatic cardioverter/defibrillator) present 05/25/2016   biv icd   Bunion, right    Chronic combined systolic and diastolic CHF (congestive heart failure) ([place])    Echo 1/18: Mild conc LVH, EF 15-20, severe diff HK, inf and inf-septal AK, Gr 3  DD, mild to mod MR, severe LAE, mod reduced RVSF, mod RAE, mild TR, PASP 50   Coronary artery disease involving native coronary artery without angina pectoris 04/17/2016   LHC 1/18: pLCx 61, mLCx 20, mRCA 40, dRCA 20, LVEDP 23, mean RA 8, PA 42/20, PCWP 17   Diabetes  mellitus without complication (HCC)    DJD (degenerative joint disease)    Gastric cancer (HCC)    History of atrial fibrillation    History of atrial flutter    History of cardiomegaly 06/07/2016   Noted on CXR   History of colon polyps 06/28/2017   Noted on colonoscopy   LBBB (left bundle branch block)    NICM (nonischemic cardiomyopathy) ([place])    Echo 1/18:  Mild conc LVH, EF 15-20, severe diff HK, inf and inf-septal AK, Gr 3 DD, mild to mod MR, severe LAE, mod reduced RVSF, mod RAE, mild TR, PASP 50   OA (osteoarthritis)    knee   Other secondary pulmonary hypertension ([place]) 04/17/2016   Prostate cancer ([place]) 2019   Sigmoid diverticulosis 06/28/2017   Noted on colonoscopy    Current Outpatient Medications  Medication Sig Dispense Refill   atorvastatin (LIPITOR) 40 MG tablet Take 1 tablet (40 mg total) by mouth daily. 90 tablet 3   Biotin 1000 MCG tablet Take 1,000 mcg by mouth daily with breakfast.     cholecalciferol (VITAMIN D3) 25 MCG (1000 UT) tablet Take 1,000 Units by mouth daily with breakfast.     empagliflozin (JARDIANCE) 10 MG TABS tablet Take 1 tablet (10 mg total) by mouth daily. 90 tablet 3   furosemide (LASIX) 40 MG tablet Take 1 tablet (40 mg total) by mouth daily. 30 tablet 2   gabapentin (NEURONTIN) 100 MG capsule TAKE 1 CAPSULE(100 MG) BY MOUTH TWICE DAILY (Patient taking differently: Take 100 mg by mouth 2 (two) times daily.) 60 capsule 2   metFORMIN (GLUCOPHAGE-XR) 500 MG 24 hr tablet Take 500 mg by mouth every evening.     metoprolol succinate (TOPROL-XL) 25 MG 24 hr tablet TAKE 1 TABLET(25 MG) BY MOUTH DAILY 90 tablet 3   multivitamin (ONE-A-DAY MEN'S) TABS tablet Take 1 tablet by mouth daily with breakfast.     pantoprazole (PROTONIX) 40 MG tablet Take 1 tablet (40 mg total) by mouth 2 (two) times daily. 60 tablet 0   potassium chloride (KLOR-CON) 10 MEQ tablet Twice daily 180 tablet 0   rivaroxaban (XARELTO) 20 MG TABS tablet TAKE 1 TABLET BY MOUTH EVERY  DAY WITH SUPPER 90 tablet 1   rivaroxaban (XARELTO) 20 MG TABS tablet Take 20 mg by mouth daily with supper.     sacubitril-valsartan (ENTRESTO) 24-26 MG Take 1 tablet by mouth 2 (two) times daily. (Patient taking differently: Take 1 tablet by mouth 2 (two) times daily. Only take if systolic BP >80) 165 tablet 3   sotalol (BETAPACE) 80 MG tablet TAKE 1 AND 1/2 TABLETS(120 MG) BY MOUTH IN THE MORNING AND AT BEDTIME 270 tablet 0   spironolactone (ALDACTONE) 25 MG tablet Take 1 tablet (25 mg total) by mouth daily. 30 tablet 2   No current facility-administered medications for this encounter.    Allergies  Allergen Reactions   Aspirin Anaphylaxis and Hives   Sulfa Antibiotics Anaphylaxis, Hives, Swelling and Other (See Comments)    Swollen lips      Social History   Socioeconomic History   Marital status: Married    Spouse name: Not on file   Number of  children: 2   Years of education: Not on file   Highest education level: Not on file  Occupational History   Occupation: retired  Tobacco Use   Smoking status: Never   Smokeless tobacco: Never  Vaping Use   Vaping Use: Never used  Substance and Sexual Activity   Alcohol use: No   Drug use: No   Sexual activity: Not Currently  Other Topics Concern   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 Daughter, Beckie Busing, lives in [place]. Son, Justice, lives in [place].   Social Determinants of Health   Financial Resource Strain: Low Risk  (12/26/2021)   Overall Financial Resource Strain (CARDIA)    Difficulty of Paying Living Expenses: Not very hard  Food Insecurity: No Food Insecurity (12/25/2021)   Hunger Vital Sign    Worried About Running Out of Food in the Last Year: Never true    Ran Out of Food in the Last Year: Never true  Transportation Needs: No Transportation Needs (12/26/2021)   PRAPARE - Hydrologist (Medical): No    Lack of  Transportation (Non-Medical): No  Physical Activity: Insufficiently Active (09/26/2021)   Exercise Vital Sign    Days of Exercise per Week: 7 days    Minutes of Exercise per Session: 10 min  Stress: No Stress Concern Present (09/26/2021)   [place]    Feeling of Stress : Not at all  Social Connections: Not on file  Intimate Partner Violence: Not At Risk (12/25/2021)   Humiliation, Afraid, Rape, and Kick questionnaire    Fear of Current or Ex-Partner: No    Emotionally Abused: No    Physically Abused: No    Sexually Abused: No      Family History  Problem Relation Age of Onset   Hypertension Mother    Heart disease Mother    Diabetes Mother    Diabetes Father    Hypertension Father    Cancer Father        lung cancer   Healthy Sister    Heart attack Brother    Heart disease Brother 73       + tobacco   Healthy Brother     Vitals:   01/09/22 1059  BP: 90/62  Pulse: 65  SpO2: 94%  Weight: 81.8 kg (180 lb 6.4 oz)     PHYSICAL EXAM: General:  AAM, ambulated into clinic with a walker HEENT: normal Neck: supple. no JVD. Carotids 2+ bilat; no bruits.  Cor: PMI nondisplaced. Regular rate & rhythm. No rubs, gallop, II/IV MR murmur Lungs: clear Abdomen: soft, nontender, nondistended.  Extremities: no cyanosis, clubbing, rash, edema Neuro: alert & orientedx3, cranial nerves grossly intact. moves all 4 extremities w/o difficulty. Affect pleasant   ECG: Not performed   ASSESSMENT & PLAN:  Chronic Systolic Heart Failure - HF dates back to 2018, EF 15-20%, NICM. LHC w/ mild to moderate, non-obstructive - Has ICD. High burden of RV pacing on device checks, LV function has remained about 20-25% - Echo 10/22 EF 20-25%, mod-severe MR, mod TR, RV nl  - Echo 2/23 EF 20-25%, mild MR, RV not well visualized - Echo 8/23 EF 20-25%, RV mildly reduced, mod-severe MR, mild-mod TR - Recent admit for a/c CHF in  setting of PNA. NYHA status difficult to assess d/t functional impairment from peripheral neuropathy but likely II/early III. - Volume looks good  on exam. CorVue thoracic impedance trending above threshold on device check. Continue 40 mg furosemide daily for now. May need to cut back depending on today's labs. - Soft BP limits medical therapy - Has not been taking Entresto regularly (home BP has been too soft), likely no BP room for ARB either - Continue Jardiance 10 mg daily  - Continue spiro 25 mg daily - Continue Toprol XL 25 mg daily - Given his functional status and co-morbidities (including malignancy) he is not a candidate for advanced heart failure therapies (I.e VAD)  - consider repeat echo in a couple of months to reassess MR  2. Permanent Afib - rate controlled - has been on sotalol (see below) - on Xarelto for a/c   3. CAD - nonobstructive by cath in 2018  - denies CP - continue medical management   4. Mitral Regurgitation/ Tricuspid Regurgitation  - likely functional in setting of dilated LV and dilated RA respectively  - MR mod-severe on echo 8/23 - TR mild-mod on echo 8/23 - consider repeat echo in next couple of months - HF optimization per above   5. Prostate Cancer - Follows with Dr. Burr Medico - no longer doing treatments.   6. Gastric Cancer  - In remission-on surveillance  7. High RV Pacing % - He's had high burden of RV pacing for some time on review of device checks. 89% on today's check.  - he has been on sotalol for afib which is now permanent. Discussed with EP. They will review need for sotalol +/- candidacy for device upgrade   NYHA II GDMT  Diuretic- Lasix 40 mg daily BB- sotolol 120 mg bid (EP reviewing), Toprol XL 25 mg daily Ace/ARB/ARNI- Entresto 24-26 mg bid (when BP allows) MRA- Spiro 25 mg daily SGLT2i Jardiance 10 mg daily     Referred to HFSW (PCP, Medications, Transportation, ETOH Abuse, Drug Abuse, Insurance, Museum/gallery curator ): No Refer to  Pharmacy:  No Refer to Home Health: No Refer to Advanced Heart Failure Clinic: No   Refer to General Cardiology:  Yes return to Dr. Cathie Olden and Dr. Lovena Le   Follow up  f/u with Dr. [name] in 4 weeks, EP contacted regarding f/u

## 2022-01-09 ENCOUNTER — Encounter (HOSPITAL_COMMUNITY): Payer: Self-pay

## 2022-01-09 ENCOUNTER — Ambulatory Visit (HOSPITAL_COMMUNITY)
Admission: RE | Admit: 2022-01-09 | Discharge: 2022-01-09 | Disposition: A | Discharge status: Home / Self Care | Payer: Medicare HMO | Source: Ambulatory Visit | Attending: Physician Assistant | Admitting: Physician Assistant

## 2022-01-09 VITALS — BP 90/62 | HR 65 | Wt 180.4 lb

## 2022-01-09 DIAGNOSIS — Z95810 Presence of automatic (implantable) cardiac defibrillator: Secondary | ICD-10-CM | POA: Insufficient documentation

## 2022-01-09 DIAGNOSIS — I428 Other cardiomyopathies: Secondary | ICD-10-CM | POA: Diagnosis not present

## 2022-01-09 DIAGNOSIS — I4821 Permanent atrial fibrillation: Secondary | ICD-10-CM

## 2022-01-09 DIAGNOSIS — Z8546 Personal history of malignant neoplasm of prostate: Secondary | ICD-10-CM | POA: Insufficient documentation

## 2022-01-09 DIAGNOSIS — Z79899 Other long term (current) drug therapy: Secondary | ICD-10-CM | POA: Diagnosis not present

## 2022-01-09 DIAGNOSIS — I5022 Chronic systolic (congestive) heart failure: Secondary | ICD-10-CM

## 2022-01-09 DIAGNOSIS — Z8249 Family history of ischemic heart disease and other diseases of the circulatory system: Secondary | ICD-10-CM | POA: Insufficient documentation

## 2022-01-09 DIAGNOSIS — Z8500 Personal history of malignant neoplasm of unspecified digestive organ: Secondary | ICD-10-CM | POA: Diagnosis not present

## 2022-01-09 DIAGNOSIS — I340 Nonrheumatic mitral (valve) insufficiency: Secondary | ICD-10-CM

## 2022-01-09 DIAGNOSIS — Z7984 Long term (current) use of oral hypoglycemic drugs: Secondary | ICD-10-CM | POA: Diagnosis not present

## 2022-01-09 DIAGNOSIS — I2510 Atherosclerotic heart disease of native coronary artery without angina pectoris: Secondary | ICD-10-CM | POA: Insufficient documentation

## 2022-01-09 DIAGNOSIS — G620 Drug-induced polyneuropathy: Secondary | ICD-10-CM | POA: Diagnosis not present

## 2022-01-09 DIAGNOSIS — Z7901 Long term (current) use of anticoagulants: Secondary | ICD-10-CM | POA: Diagnosis not present

## 2022-01-09 DIAGNOSIS — I081 Rheumatic disorders of both mitral and tricuspid valves: Secondary | ICD-10-CM | POA: Insufficient documentation

## 2022-01-09 LAB — CUP PACEART REMOTE DEVICE CHECK
Battery Remaining Longevity: 25 mo
Battery Remaining Percentage: 26 %
Battery Voltage: 2.89 V
Brady Statistic AP VP Percent: 17 %
Brady Statistic AP VS Percent: 0 %
Brady Statistic AS VP Percent: 83 %
Brady Statistic AS VS Percent: 0 %
Brady Statistic RA Percent Paced: 1 %
Brady Statistic RV Percent Paced: 89 %
Date Time Interrogation Session: 20231127020015
HighPow Impedance: 64 Ohm
HighPow Impedance: 64 Ohm
Implantable Lead Connection Status: 753985
Implantable Lead Connection Status: 753985
Implantable Lead Implant Date: 20180413
Implantable Lead Implant Date: 20200828
Implantable Lead Location: 753859
Implantable Lead Location: 753860
Implantable Lead Model: 7122
Implantable Pulse Generator Implant Date: 20180413
Lead Channel Impedance Value: 410 Ohm
Lead Channel Impedance Value: 610 Ohm
Lead Channel Pacing Threshold Amplitude: 0.75 V
Lead Channel Pacing Threshold Amplitude: 0.75 V
Lead Channel Pacing Threshold Pulse Width: 0.5 ms
Lead Channel Pacing Threshold Pulse Width: 0.5 ms
Lead Channel Sensing Intrinsic Amplitude: 11.7 mV
Lead Channel Sensing Intrinsic Amplitude: 2.2 mV
Lead Channel Setting Pacing Amplitude: 2 V
Lead Channel Setting Pacing Amplitude: 2.5 V
Lead Channel Setting Pacing Pulse Width: 0.5 ms
Lead Channel Setting Sensing Sensitivity: 0.5 mV
Pulse Gen Serial Number: 7398151
Zone Setting Status: 755011

## 2022-01-09 LAB — BASIC METABOLIC PANEL
Anion gap: 11 (ref 5–15)
BUN: 16 mg/dL (ref 8–23)
CO2: 24 mmol/L (ref 22–32)
Calcium: 9.8 mg/dL (ref 8.9–10.3)
Chloride: 106 mmol/L (ref 98–111)
Creatinine, Ser: 1.08 mg/dL (ref 0.61–1.24)
GFR, Estimated: >60 mL/min (ref >60)
Glucose, Bld: 97 mg/dL (ref 70–99)
Potassium: 3.8 mmol/L (ref 3.5–5.1)
Sodium: 141 mmol/L (ref 135–145)

## 2022-01-09 LAB — BRAIN NATRIURETIC PEPTIDE: B Natriuretic Peptide: 629.3 pg/mL — ABNORMAL HIGH (ref 0.0–100.0)

## 2022-01-09 NOTE — Patient Instructions (Signed)
Medication Changes:  No changes, continue current regimen.   Lab Work:  Labs done today, your results will be available in MyChart, we will contact you for abnormal readings.  Referrals:  You have "graduated" from [facility] clinic. Please call Dr. Elmarie Shiley office to get a cardiology follow up within the next 4-5 weeks.   Special Instructions // Education:  Do the following things EVERYDAY: Weigh yourself in the morning before breakfast. Write it down and keep it in a log. Take your medicines as prescribed Eat low salt foods--Limit salt (sodium) to 2000 mg per day.  Stay as active as you can everyday Limit all fluids for the day to less than 2 liters  Follow-Up in: with Dr. [name] in 4-5 weeks.

## 2022-01-09 NOTE — Telephone Encounter (Signed)
Call attempted to confirm [facility] TOC appt 10 am on 01/09/22. HIPPA appropriate VM left with callback number.   Earnestine Leys, BSN, Clinical cytogeneticist Only

## 2022-01-10 DIAGNOSIS — I5043 Acute on chronic combined systolic (congestive) and diastolic (congestive) heart failure: Secondary | ICD-10-CM | POA: Diagnosis not present

## 2022-01-10 DIAGNOSIS — G8929 Other chronic pain: Secondary | ICD-10-CM | POA: Diagnosis not present

## 2022-01-10 DIAGNOSIS — E7800 Pure hypercholesterolemia, unspecified: Secondary | ICD-10-CM | POA: Diagnosis not present

## 2022-01-10 DIAGNOSIS — N400 Enlarged prostate without lower urinary tract symptoms: Secondary | ICD-10-CM | POA: Diagnosis not present

## 2022-01-10 DIAGNOSIS — E1169 Type 2 diabetes mellitus with other specified complication: Secondary | ICD-10-CM | POA: Diagnosis not present

## 2022-01-15 ENCOUNTER — Telehealth: Payer: Self-pay | Admitting: Internal Medicine

## 2022-01-15 NOTE — Telephone Encounter (Signed)
Patient returning call from today. I did not see any notes.

## 2022-01-15 NOTE — Telephone Encounter (Signed)
Left a message to call back.  RN reviewed chart appears that Jeani Hawking Via, LPN called pt and left a message to call back on 01/09/22.  Will send call to [facility] to f/u.

## 2022-01-16 NOTE — Telephone Encounter (Signed)
**Note De-Identified Jon Williams Obfuscation** No answer so I left a message on the pts VM asking him to call Jeani Hawking back at [facility] at [phone].

## 2022-01-16 NOTE — Telephone Encounter (Signed)
**Note De-Identified Charolette Bultman Obfuscation** The pt called me back and states that someone from [facility] called him yesterday but did not leave a message.  I advised him that I did return his call on 11/28 but got no answer so I left a message asking him to call me back.  He does not remember calling us on 11/28 and states that he is just returning our call from yesterday.  I have checked his chart and do not see where anyone from [place] called him yesterday except the nurse who returned his call yesterday and then me this morning.  The pt requested that I s/w his wife [facility] and I did.  I questioned if they need help with the cost of his medications and she stated that they do not and that they will call us back if they do.  She states that the pt gets confused and that it is best to s/w her when calling him back.  She thanked me for calling them back.

## 2022-01-19 DIAGNOSIS — I5043 Acute on chronic combined systolic (congestive) and diastolic (congestive) heart failure: Secondary | ICD-10-CM | POA: Diagnosis not present

## 2022-01-19 DIAGNOSIS — N400 Enlarged prostate without lower urinary tract symptoms: Secondary | ICD-10-CM | POA: Diagnosis not present

## 2022-01-19 DIAGNOSIS — E7800 Pure hypercholesterolemia, unspecified: Secondary | ICD-10-CM | POA: Diagnosis not present

## 2022-01-19 DIAGNOSIS — E1169 Type 2 diabetes mellitus with other specified complication: Secondary | ICD-10-CM | POA: Diagnosis not present

## 2022-01-19 DIAGNOSIS — G8929 Other chronic pain: Secondary | ICD-10-CM | POA: Diagnosis not present

## 2022-01-26 NOTE — Telephone Encounter (Signed)
PATIENT WS APPROVED FOR ENTRESTO UNTIL 02/12/23  ID# [id number], PHONE # [phone]

## 2022-02-19 NOTE — Progress Notes (Signed)
Remote ICD transmission.   

## 2022-02-27 DIAGNOSIS — Z0000 Encounter for general adult medical examination without abnormal findings: Secondary | ICD-10-CM | POA: Diagnosis not present

## 2022-02-27 DIAGNOSIS — I2510 Atherosclerotic heart disease of native coronary artery without angina pectoris: Secondary | ICD-10-CM | POA: Diagnosis not present

## 2022-02-27 DIAGNOSIS — E7800 Pure hypercholesterolemia, unspecified: Secondary | ICD-10-CM | POA: Diagnosis not present

## 2022-02-27 DIAGNOSIS — D6869 Other thrombophilia: Secondary | ICD-10-CM | POA: Diagnosis not present

## 2022-02-27 DIAGNOSIS — I739 Peripheral vascular disease, unspecified: Secondary | ICD-10-CM | POA: Diagnosis not present

## 2022-02-27 DIAGNOSIS — I5040 Unspecified combined systolic (congestive) and diastolic (congestive) heart failure: Secondary | ICD-10-CM | POA: Diagnosis not present

## 2022-02-27 DIAGNOSIS — Z8546 Personal history of malignant neoplasm of prostate: Secondary | ICD-10-CM | POA: Diagnosis not present

## 2022-02-27 DIAGNOSIS — C169 Malignant neoplasm of stomach, unspecified: Secondary | ICD-10-CM | POA: Diagnosis not present

## 2022-02-27 DIAGNOSIS — I4891 Unspecified atrial fibrillation: Secondary | ICD-10-CM | POA: Diagnosis not present

## 2022-02-28 ENCOUNTER — Telehealth: Payer: Self-pay

## 2022-02-28 NOTE — Telephone Encounter (Signed)
-----  Message from Ramond Darnell, [place] sent at 01/15/2022  3:12 PM EST ----- 2nd attempt to contact pt. LM to call back.  ----- Message ----- From: Carylon Perches, CMA Sent: 01/11/2022   2:09 PM EST To: Kendale Rembold Louretta Shorten, CMA  LM for pt to call back  ----- Message ----- From: Shirley Friar, PA-C Sent: 01/11/2022   1:53 PM EST To: Mildred Bollard Louretta Shorten, CMA  Discussed this patient with Dr. Lovena Le.   Can we please stop his Sotalol now that his AF is permanent, and have him see Dr. Lovena Le to discuss ICD upgrade?    ----- Message ----- From: Joette Catching, PA-C Sent: 01/09/2022   3:48 PM EST To: Shirley Friar, PA-C  Sure was. Sorry about that. I carried forward the message from the wrong patient.  Good afternoon,  I think patient last saw you about a year ago then Dr. Lovena Le in January 2023. I think he's permanent AF now and has been on sotalol for years. Saw he has high burden of RV pacing. Not sure how much it is contributing to his heart failure. Do you think he still needs the sotalol or would a lower dose be a good idea to decrease RV pacing? Let me know your thoughts.  Thanks!  Marlyce Huge

## 2022-02-28 NOTE — Telephone Encounter (Signed)
3rd attempt to call pt to schedule appt with Dr. Lovena Le to discuss ICD upgrade per AT.   LM for pt to call back.

## 2022-03-02 ENCOUNTER — Ambulatory Visit: Payer: Medicare HMO | Attending: Cardiovascular Disease | Admitting: Cardiovascular Disease

## 2022-03-02 ENCOUNTER — Encounter: Payer: Self-pay | Admitting: Cardiovascular Disease

## 2022-03-02 VITALS — BP 96/60 | HR 64 | Ht 66.0 in | Wt 182.8 lb

## 2022-03-02 DIAGNOSIS — D500 Iron deficiency anemia secondary to blood loss (chronic): Secondary | ICD-10-CM | POA: Diagnosis not present

## 2022-03-02 DIAGNOSIS — I5043 Acute on chronic combined systolic (congestive) and diastolic (congestive) heart failure: Secondary | ICD-10-CM | POA: Diagnosis not present

## 2022-03-02 DIAGNOSIS — E7800 Pure hypercholesterolemia, unspecified: Secondary | ICD-10-CM | POA: Diagnosis not present

## 2022-03-02 DIAGNOSIS — N400 Enlarged prostate without lower urinary tract symptoms: Secondary | ICD-10-CM | POA: Diagnosis not present

## 2022-03-02 DIAGNOSIS — G8929 Other chronic pain: Secondary | ICD-10-CM | POA: Diagnosis not present

## 2022-03-02 DIAGNOSIS — I5042 Chronic combined systolic (congestive) and diastolic (congestive) heart failure: Secondary | ICD-10-CM | POA: Diagnosis not present

## 2022-03-02 DIAGNOSIS — I4811 Longstanding persistent atrial fibrillation: Secondary | ICD-10-CM | POA: Diagnosis not present

## 2022-03-02 DIAGNOSIS — E1169 Type 2 diabetes mellitus with other specified complication: Secondary | ICD-10-CM | POA: Diagnosis not present

## 2022-03-02 MED ORDER — RIVAROXABAN 20 MG PO TABS: ORAL_TABLET | ORAL | 2 refills | Status: DC

## 2022-03-02 NOTE — Patient Instructions (Signed)
Medication Instructions:  Your physician recommends that you continue on your current medications as directed. Please refer to the Current Medication list given to you today.  *If you need a refill on your cardiac medications before your next appointment, please call your pharmacy*   Lab Work: NONE If you have labs (blood work) drawn today and your tests are completely normal, you will receive your results only by: MyChart Message (if you have MyChart) OR A paper copy in the mail If you have any lab test that is abnormal or we need to change your treatment, we will call you to review the results.   Testing/Procedures: NONE   Follow-Up: At  [facility], you and your health needs are our priority.  As part of our continuing mission to provide you with exceptional heart care, we have created designated Provider Care Teams.  These Care Teams include your primary Cardiologist (physician) and Advanced Practice Providers (APPs -  Physician Assistants and Nurse Practitioners) who all work together to provide you with the care you need, when you need it.  We recommend signing up for the patient portal called "MyChart".  Sign up information is provided on this After Visit Summary.  MyChart is used to connect with patients for Virtual Visits (Telemedicine).  Patients are able to view lab/test results, encounter notes, upcoming appointments, etc.  Non-urgent messages can be sent to your provider as well.   To learn more about what you can do with MyChart, go to [email or web address].    Your next appointment:   6 month(s)  Provider:   Philip [name], MD      

## 2022-03-02 NOTE — Progress Notes (Signed)
Cardiology Office Note:    Date:  03/03/2022   ID:  Jon Williams, DOB 1950-03-02, MRN [id number]  PCP:  [name], [facility] Cardiologist:  Mertie Moores, MD Electrophysiologist:  Cristopher Peru, MD {   Referring MD: [name], MD   Chief Complaint  Patient presents with   Congestive Heart Failure           History of Present Illness:    Jon Williams is a 72 y.o. male with a hx of chronic combined systolic and diastolic congestive heart failure, atrial fibrillation . Was assigned to see him in 2018.  He was seen by [name] at that time.  He is seen numerous other cardiologist over the years but this is the first time a me actually meeting him in person.  He was recently admitted to the hospital with acute on chronic combined systolic heart failure.   Had  severe shortness of breath  He has a history of an ICD, persistent atrial fibrillation, prostate cancer, gastric cancer, hypertension, dyslipidemia, diabetes mellitus  They try to avoid salty foods.    He is on Entresto, Lasix Is feeling better now   BP is a bit low now  Has not had syncope or presyncope   Was dx with stomach cancer, Received chemo x 5 treatments due to severe complications . Scans have looked ok .  Just following for now   March 02, 2022: [name] is seen today for follow-up of his congestive heart failure.  He has a history of gastric cancer.  Breathing has been ok Tries to exercise some  No CP , no syncope     Past Medical History:  Diagnosis Date   AICD (automatic cardioverter/defibrillator) present 05/25/2016   biv icd   Bunion, right    Chronic combined systolic and diastolic CHF (congestive heart failure) ([place])    Echo 1/18: Mild conc LVH, EF 15-20, severe diff HK, inf and inf-septal AK, Gr 3 DD, mild to mod MR, severe LAE, mod reduced RVSF, mod RAE, mild TR, PASP 50   Coronary artery disease involving native coronary artery without  angina pectoris 04/17/2016   LHC 1/18: pLCx 30, mLCx 20, mRCA 40, dRCA 20, LVEDP 23, mean RA 8, PA 42/20, PCWP 17   Diabetes mellitus without complication (HCC)    DJD (degenerative joint disease)    Gastric cancer (HCC)    History of atrial fibrillation    History of atrial flutter    History of cardiomegaly 06/07/2016   Noted on CXR   History of colon polyps 06/28/2017   Noted on colonoscopy   LBBB (left bundle branch block)    NICM (nonischemic cardiomyopathy) ([place])    Echo 1/18:  Mild conc LVH, EF 15-20, severe diff HK, inf and inf-septal AK, Gr 3 DD, mild to mod MR, severe LAE, mod reduced RVSF, mod RAE, mild TR, PASP 50   OA (osteoarthritis)    knee   Other secondary pulmonary hypertension ([place]) 04/17/2016   Prostate cancer ([place]) 2019   Sigmoid diverticulosis 06/28/2017   Noted on colonoscopy    Past Surgical History:  Procedure Laterality Date   BIOPSY  12/01/2020   Procedure: BIOPSY;  Surgeon: [name], MD;  Location: [facility];  Service: Endoscopy;;   BIOPSY  06/09/2021   Procedure: BIOPSY;  Surgeon: [name], MD;  Location: [facility];  Service: Gastroenterology;;   BIV ICD INSERTION CRT-D N/A 05/25/2016   Procedure: BiV ICD Insertion  CRT-D;  Surgeon: [name], MD;  Location: [facility];  Service: Cardiovascular;  Laterality: N/A;   CARDIAC CATHETERIZATION N/A 03/02/2016   Procedure: Right/Left Heart Cath and Coronary Angiography;  Surgeon: Nelva Bush, MD;  Location: [facility];  Service: Cardiovascular;  Laterality: N/A;   CARDIOVERSION N/A 07/17/2016   Procedure: Cardioversion;  Surgeon: [name], MD;  Location: [facility];  Service: Cardiovascular;  Laterality: N/A;   COLONOSCOPY WITH PROPOFOL N/A 06/28/2017   Procedure: COLONOSCOPY WITH PROPOFOL;  Surgeon: [name], MD;  Location: [facility];  Service: Endoscopy;  Laterality: N/A;   colonscopy  2009   ESOPHAGOGASTRODUODENOSCOPY N/A 12/01/2020   Procedure:  ESOPHAGOGASTRODUODENOSCOPY (EGD);  Surgeon: [name], MD;  Location: [facility];  Service: Endoscopy;  Laterality: N/A;  IDA/guaiac positive stools   ESOPHAGOGASTRODUODENOSCOPY (EGD) WITH PROPOFOL N/A 12/16/2020   Procedure: ESOPHAGOGASTRODUODENOSCOPY (EGD) WITH PROPOFOL;  Surgeon: [name], MD;  [facility];  Service: Endoscopy;  Laterality: N/A;   ESOPHAGOGASTRODUODENOSCOPY (EGD) WITH PROPOFOL N/A 06/09/2021   Procedure: ESOPHAGOGASTRODUODENOSCOPY (EGD) WITH PROPOFOL;  Surgeon: [name], MD;  [facility];  Service: Gastroenterology;  Laterality: N/A;   GOLD SEED IMPLANT N/A 12/24/2017   Procedure: GOLD SEED IMPLANT, TRANSERINEAL;  Surgeon: Festus Aloe, MD;  [facility];  Service: Urology;  Laterality: N/A;   INSERT / REPLACE / REMOVE PACEMAKER     LEAD REVISION  10/10/2018   LEAD REVISION/REPAIR N/A 10/10/2018   Procedure: LEAD REVISION/REPAIR;  Surgeon: [name], MD;  Location: [facility];  Service: Cardiovascular;  Laterality: N/A;   POLYPECTOMY  06/28/2017   Procedure: POLYPECTOMY;  Surgeon: [name], MD;  [facility];  Service: Endoscopy;;  ascending and descending colon polyp   PORTACATH PLACEMENT Right 12/23/2020   Procedure: INSERTION PORT-A-CATH;  Surgeon: [name], MD;  Location: [facility];  Service: General;  Laterality: Right;   PROSTATE BIOPSY  02/20/2017   SPACE OAR INSTILLATION N/A 12/24/2017   Procedure: SPACE OAR INSTILLATION;  Surgeon: Festus Aloe, MD;  [facility];  Service: Urology;  Laterality: N/A;   TOTAL KNEE ARTHROPLASTY Right 08/16/2017   Procedure: RIGHT TOTAL KNEE ARTHROPLASTY;  Surgeon: [name], MD;  Location: [facility];  Service: Orthopedics;  Laterality: Right;   UPPER ESOPHAGEAL ENDOSCOPIC ULTRASOUND (EUS) N/A 12/16/2020   Procedure: UPPER ESOPHAGEAL ENDOSCOPIC ULTRASOUND (EUS);  Surgeon: [name], MD;  Location: [facility];  Service: Endoscopy;  Laterality: N/A;     Current Medications: Current Meds  Medication Sig   atorvastatin (LIPITOR) 40 MG tablet Take 1 tablet (40 mg total) by mouth daily.   Biotin 1000 MCG tablet Take 1,000 mcg by mouth daily with breakfast.   cholecalciferol (VITAMIN D3) 25 MCG (1000 UT) tablet Take 1,000 Units by mouth daily with breakfast.   empagliflozin (JARDIANCE) 10 MG TABS tablet Take 1 tablet (10 mg total) by mouth daily.   furosemide (LASIX) 40 MG tablet Take 1 tablet (40 mg total) by mouth daily.   gabapentin (NEURONTIN) 100 MG capsule TAKE 1 CAPSULE(100 MG) BY MOUTH TWICE DAILY   metFORMIN (GLUCOPHAGE-XR) 500 MG 24 hr tablet Take 500 mg by mouth every evening.   metoprolol succinate (TOPROL-XL) 25 MG 24 hr tablet TAKE 1 TABLET(25 MG) BY MOUTH DAILY   multivitamin (ONE-A-DAY MEN'S) TABS tablet Take 1 tablet by mouth daily with breakfast.   pantoprazole (PROTONIX) 40 MG tablet Take 1 tablet (40 mg total) by mouth 2 (two) times daily.  potassium chloride (KLOR-CON) 10 MEQ tablet Twice daily   sacubitril-valsartan (ENTRESTO) 24-26 MG Take 1 tablet by mouth 2 (two) times daily.   sotalol (BETAPACE) 80 MG tablet TAKE 1 AND 1/2 TABLETS(120 MG) BY MOUTH IN THE MORNING AND AT BEDTIME   spironolactone (ALDACTONE) 25 MG tablet Take 1 tablet (25 mg total) by mouth daily.   [DISCONTINUED] rivaroxaban (XARELTO) 20 MG TABS tablet TAKE 1 TABLET BY MOUTH EVERY DAY WITH SUPPER     Allergies:   Aspirin and Sulfa antibiotics   Social History   Socioeconomic History   Marital status: Married    Spouse name: Not on file   Number of children: 2   Years of education: Not on file   Highest education level: Not on file  Occupational History   Occupation: retired  Tobacco Use   Smoking status: Never   Smokeless tobacco: Never  Vaping Use   Vaping Use: Never used  Substance and Sexual Activity   Alcohol use: No   Drug use: No   Sexual activity: Not Currently  Other Topics Concern   Not on file  Social History Narrative    Retired Glass blower/designer. Married to Mrs. Townsend Roger. Daughter, Beckie Busing, lives in [place]. Son, [place], lives in [place].   Social Determinants of Health   Financial Resource Strain: Low Risk  (12/26/2021)   Overall Financial Resource Strain (CARDIA)    Difficulty of Paying Living Expenses: Not very hard  Food Insecurity: No Food Insecurity (12/25/2021)   Hunger Vital Sign    Worried About Running Out of Food in the Last Year: Never true    Ran Out of Food in the Last Year: Never true  Transportation Needs: No Transportation Needs (12/26/2021)   PRAPARE - Hydrologist (Medical): No    Lack of Transportation (Non-Medical): No  Physical Activity: Insufficiently Active (09/26/2021)   Exercise Vital Sign    Days of Exercise per Week: 7 days    Minutes of Exercise per Session: 10 min  Stress: No Stress Concern Present (09/26/2021)   [place]    Feeling of Stress : Not at all  Social Connections: Not on file     Family History: The patient's family history includes Cancer in his father; Diabetes in his father and mother; Healthy in his brother and sister; Heart attack in his brother; Heart disease in his mother; Heart disease (age of onset: 71) in his brother; Hypertension in his father and mother.  ROS:   Please see the history of present illness.     All other systems reviewed and are negative.  EKGs/Labs/Other Studies Reviewed:    The following studies were reviewed today:   EKG:   Recent Labs: 07/28/2021: TSH 2.108 11/16/2021: ALT 24 12/26/2021: Hemoglobin 12.8; Platelets 168 12/27/2021: Magnesium 1.9 01/09/2022: B Natriuretic Peptide 629.3; BUN 16; Creatinine, Ser 1.08; Potassium 3.8; Sodium 141  Recent Lipid Panel    Component Value Date/Time   CHOL 132 06/06/2016 1453   TRIG 53 06/06/2016 1453   HDL 60 06/06/2016 1453   CHOLHDL 2.2 06/06/2016 1453   LDLCALC 61  06/06/2016 1453     Risk Assessment/Calculations:                Physical Exam:     Physical Exam: Blood pressure 96/60, pulse 64, height '5\' 6"'$  (1.676 m), weight 182 lb 12.8 oz (82.9 kg), SpO2 97 %.  GEN:  chronically ill appearing man  in no acute distress HEENT: Normal NECK: No JVD; No carotid bruits LYMPHATICS: No lymphadenopathy CARDIAC: RRR , no murmurs, rubs, gallops RESPIRATORY:  Clear to auscultation without rales, wheezing or rhonchi  ABDOMEN: Soft, non-tender, non-distended MUSCULOSKELETAL:  No edema; No deformity  SKIN: Warm and dry NEUROLOGIC:  Alert and oriented x 3   ASSESSMENT:    1. Chronic combined systolic and diastolic heart failure (HCC)     PLAN:       Chronic combined systolic and diastolic congestive heart failure.   He seems to be holding his own.  He has several major issues.  He has a history of stomach cancer.  The chemotherapy has caused lots of muscle weakness.  He does not appear to have gotten over this muscle weakness. His breathing is overall fairly stable.  He tries to workout and walk on a regular basis.  Blood pressure and heart rate are on the low side.  We will not change any of his medications at this point.  I will see him again in 6 months.  2.  History of gastric cancer:   3.  History of atrial fibrillation:            Medication Adjustments/Labs and Tests Ordered: Current medicines are reviewed at length with the patient today.  Concerns regarding medicines are outlined above.  No orders of the defined types were placed in this encounter.  Meds ordered this encounter  Medications   rivaroxaban (XARELTO) 20 MG TABS tablet    Sig: TAKE 1 TABLET BY MOUTH EVERY DAY WITH SUPPER    Dispense:  90 tablet    Refill:  2    Patient Instructions  Medication Instructions:  Your physician recommends that you continue on your current medications as directed. Please refer to the Current Medication list given to you  today.  *If you need a refill on your cardiac medications before your next appointment, please call your pharmacy*   Lab Work: NONE If you have labs (blood work) drawn today and your tests are completely normal, you will receive your results only by: [place] (if you have MyChart) OR A paper copy in the mail If you have any lab test that is abnormal or we need to change your treatment, we will call you to review the results.   Testing/Procedures: NONE   Follow-Up: At [facility], you and your health needs are our priority.  As part of our continuing mission to provide you with exceptional heart care, we have created designated Provider Care Teams.  These Care Teams include your primary Cardiologist (physician) and Advanced Practice Providers (APPs -  Physician Assistants and Nurse Practitioners) who all work together to provide you with the care you need, when you need it.  We recommend signing up for the patient portal called "MyChart".  Sign up information is provided on this After Visit Summary.  MyChart is used to connect with patients for Virtual Visits (Telemedicine).  Patients are able to view lab/test results, encounter notes, upcoming appointments, etc.  Non-urgent messages can be sent to your provider as well.   To learn more about what you can do with MyChart, go to [email or web address].    Your next appointment:   6 month(s)  Provider:   Mertie Moores, MD        Signed, Mertie Moores, MD  03/03/2022 9:33 AM    [place]

## 2022-03-03 ENCOUNTER — Encounter: Payer: Self-pay | Admitting: Cardiovascular Disease

## 2022-03-08 ENCOUNTER — Other Ambulatory Visit: Payer: Self-pay | Admitting: Internal Medicine

## 2022-03-08 DIAGNOSIS — I4819 Other persistent atrial fibrillation: Secondary | ICD-10-CM

## 2022-03-08 NOTE — Telephone Encounter (Signed)
D/C per Dr. Lovena Le

## 2022-03-13 ENCOUNTER — Ambulatory Visit: Payer: Medicare HMO | Attending: Internal Medicine | Admitting: Internal Medicine

## 2022-03-13 VITALS — BP 94/60 | HR 64 | Ht 66.0 in | Wt 181.0 lb

## 2022-03-13 DIAGNOSIS — I5043 Acute on chronic combined systolic (congestive) and diastolic (congestive) heart failure: Secondary | ICD-10-CM | POA: Diagnosis not present

## 2022-03-13 DIAGNOSIS — Z95810 Presence of automatic (implantable) cardiac defibrillator: Secondary | ICD-10-CM

## 2022-03-13 DIAGNOSIS — I4819 Other persistent atrial fibrillation: Secondary | ICD-10-CM | POA: Diagnosis not present

## 2022-03-13 MED ORDER — SPIRONOLACTONE 25 MG PO TABS: 25.0000 mg | ORAL_TABLET | Freq: Every day | ORAL | 2 refills | Status: DC

## 2022-03-13 MED ORDER — POTASSIUM CHLORIDE ER 10 MEQ PO TBCR: EXTENDED_RELEASE_TABLET | ORAL | 0 refills | Status: DC

## 2022-03-13 MED ORDER — SOTALOL HCL 80 MG PO TABS: ORAL_TABLET | ORAL | 1 refills | Status: DC

## 2022-03-13 NOTE — Patient Instructions (Addendum)
Medication Instructions:  Your physician has recommended you make the following change in your medication: Your Potassium, Sotalol, and Aldactone has been refilled and sent to your pharmacy.     Lab Work: None ordered.  If you have labs (blood work) drawn today and your tests are completely normal, you will receive your results only by: [place] (if you have MyChart) OR A paper copy in the mail If you have any lab test that is abnormal or we need to change your treatment, we will call you to review the results.  Testing/Procedures: None ordered.  Follow-Up: At [facility], you and your health needs are our priority.  As part of our continuing mission to provide you with exceptional heart care, we have created designated Provider Care Teams.  These Care Teams include your primary Cardiologist (physician) and Advanced Practice Providers (APPs -  Physician Assistants and Nurse Practitioners) who all work together to provide you with the care you need, when you need it.  We recommend signing up for the patient portal called "MyChart".  Sign up information is provided on this After Visit Summary.  MyChart is used to connect with patients for Virtual Visits (Telemedicine).  Patients are able to view lab/test results, encounter notes, upcoming appointments, etc.  Non-urgent messages can be sent to your provider as well.   To learn more about what you can do with MyChart, go to [email or web address].    Your next appointment:   1 year   The format for your next appointment:   In Person  Provider:   Cristopher Peru, MD{or one of the following Advanced Practice Providers on your designated Care Team:   Tommye Standard, [place] Legrand Como "Jonni Sanger" Chalmers Cater, [place]  Remote monitoring is used to monitor your ICD from home. This monitoring reduces the number of office visits required to check your device to one time per year. It allows [place] to keep an eye on the functioning of your device to ensure it is  working properly. You are scheduled for a device check from home on 04/11/2022. You may send your transmission at any time that day. If you have a wireless device, the transmission will be sent automatically. After your physician reviews your transmission, you will receive a postcard with your next transmission date.  Important Information About [place]

## 2022-03-13 NOTE — Progress Notes (Signed)
HPI [name] returns today for followup. He is a pleasant 72 yo man with a h/o non-ischemic CM chronic systolic heart failure and recent diagnosis of gastric CA for which he has undergone port placement and is receiving chemo therapy. He has had a h/o atrial fib. He feels well. He is no longer on chemo but undergoing surviellance.  He denies chest pain or sob. No ICD therapies.   Allergies  Allergen Reactions   Aspirin Anaphylaxis and Hives   Sulfa Antibiotics Anaphylaxis, Hives, Swelling and Other (See Comments)    Swollen lips     Current Outpatient Medications  Medication Sig Dispense Refill   atorvastatin (LIPITOR) 40 MG tablet Take 1 tablet (40 mg total) by mouth daily. 90 tablet 3   Biotin 1000 MCG tablet Take 1,000 mcg by mouth daily with breakfast.     cholecalciferol (VITAMIN D3) 25 MCG (1000 UT) tablet Take 1,000 Units by mouth daily with breakfast.     empagliflozin (JARDIANCE) 10 MG TABS tablet Take 1 tablet (10 mg total) by mouth daily. 90 tablet 3   furosemide (LASIX) 40 MG tablet Take 1 tablet (40 mg total) by mouth daily. 30 tablet 2   gabapentin (NEURONTIN) 100 MG capsule TAKE 1 CAPSULE(100 MG) BY MOUTH TWICE DAILY 60 capsule 2   metFORMIN (GLUCOPHAGE-XR) 500 MG 24 hr tablet Take 500 mg by mouth every evening.     metoprolol succinate (TOPROL-XL) 25 MG 24 hr tablet TAKE 1 TABLET(25 MG) BY MOUTH DAILY 90 tablet 3   multivitamin (ONE-A-DAY MEN'S) TABS tablet Take 1 tablet by mouth daily with breakfast.     pantoprazole (PROTONIX) 40 MG tablet Take 1 tablet (40 mg total) by mouth 2 (two) times daily. 60 tablet 0   rivaroxaban (XARELTO) 20 MG TABS tablet TAKE 1 TABLET BY MOUTH EVERY DAY WITH SUPPER 90 tablet 2   sacubitril-valsartan (ENTRESTO) 24-26 MG Take 1 tablet by mouth 2 (two) times daily. 180 tablet 3   potassium chloride (KLOR-CON) 10 MEQ tablet Twice daily 180 tablet 0   sotalol (BETAPACE) 80 MG tablet TAKE 1 AND 1/2 TABLETS(120 MG) BY MOUTH IN THE MORNING  AND AT BEDTIME 270 tablet 1   spironolactone (ALDACTONE) 25 MG tablet Take 1 tablet (25 mg total) by mouth daily. 60 tablet 2   No current facility-administered medications for this visit.     Past Medical History:  Diagnosis Date   AICD (automatic cardioverter/defibrillator) present 05/25/2016   biv icd   Bunion, right    Chronic combined systolic and diastolic CHF (congestive heart failure) ([place])    Echo 1/18: Mild conc LVH, EF 15-20, severe diff HK, inf and inf-septal AK, Gr 3 DD, mild to mod MR, severe LAE, mod reduced RVSF, mod RAE, mild TR, PASP 50   Coronary artery disease involving native coronary artery without angina pectoris 04/17/2016   LHC 1/18: pLCx 44, mLCx 20, mRCA 40, dRCA 20, LVEDP 23, mean RA 8, PA 42/20, PCWP 17   Diabetes mellitus without complication (HCC)    DJD (degenerative joint disease)    Gastric cancer (HCC)    History of atrial fibrillation    History of atrial flutter    History of cardiomegaly 06/07/2016   Noted on CXR   History of colon polyps 06/28/2017   Noted on colonoscopy   LBBB (left bundle branch block)    NICM (nonischemic cardiomyopathy) ([place])    Echo 1/18:  Mild conc LVH, EF 15-20, severe diff HK,  inf and inf-septal AK, Gr 3 DD, mild to mod MR, severe LAE, mod reduced RVSF, mod RAE, mild TR, PASP 50   OA (osteoarthritis)    knee   Other secondary pulmonary hypertension ([place]) 04/17/2016   Prostate cancer ([place]) 2019   Sigmoid diverticulosis 06/28/2017   Noted on colonoscopy    ROS:   All systems reviewed and negative except as noted in the HPI.   Past Surgical History:  Procedure Laterality Date   BIOPSY  12/01/2020   Procedure: BIOPSY;  Surgeon: [name], MD;  Location: [facility] ENDOSCOPY;  Service: Endoscopy;;   BIOPSY  06/09/2021   Procedure: BIOPSY;  Surgeon: [name], MD;  Location: [facility] ENDOSCOPY;  Service: Gastroenterology;;   BIV ICD INSERTION CRT-D N/A 05/25/2016   Procedure: BiV ICD Insertion CRT-D;  Surgeon: [name], MD;  Location: [place] CV LAB;  Service: Cardiovascular;  Laterality: N/A;   CARDIAC CATHETERIZATION N/A 03/02/2016   Procedure: Right/Left Heart Cath and Coronary Angiography;  Surgeon: Nelva Bush, MD;  Location: [place] CV LAB;  Service: Cardiovascular;  Laterality: N/A;   CARDIOVERSION N/A 07/17/2016   Procedure: Cardioversion;  Surgeon: [name], MD;  Location: [place] CV LAB;  Service: Cardiovascular;  Laterality: N/A;   COLONOSCOPY WITH PROPOFOL N/A 06/28/2017   Procedure: COLONOSCOPY WITH PROPOFOL;  Surgeon: [name], MD;  Location: [facility] ENDOSCOPY;  Service: Endoscopy;  Laterality: N/A;   colonscopy  2009   ESOPHAGOGASTRODUODENOSCOPY N/A 12/01/2020   Procedure: ESOPHAGOGASTRODUODENOSCOPY (EGD);  Surgeon: [name], MD;  Location: [facility];  Service: Endoscopy;  Laterality: N/A;  IDA/guaiac positive stools   ESOPHAGOGASTRODUODENOSCOPY (EGD) WITH PROPOFOL N/A 12/16/2020   Procedure: ESOPHAGOGASTRODUODENOSCOPY (EGD) WITH PROPOFOL;  Surgeon: [name], MD;  Location: [facility] ENDOSCOPY;  Service: Endoscopy;  Laterality: N/A;   ESOPHAGOGASTRODUODENOSCOPY (EGD) WITH PROPOFOL N/A 06/09/2021   Procedure: ESOPHAGOGASTRODUODENOSCOPY (EGD) WITH PROPOFOL;  Surgeon: [name], MD;  Location: [facility] ENDOSCOPY;  Service: Gastroenterology;  Laterality: N/A;   GOLD SEED IMPLANT N/A 12/24/2017   Procedure: GOLD SEED IMPLANT, TRANSERINEAL;  Surgeon: Festus Aloe, MD;  Location: [facility] ORS;  Service: Urology;  Laterality: N/A;   INSERT / REPLACE / REMOVE PACEMAKER     LEAD REVISION  10/10/2018   LEAD REVISION/REPAIR N/A 10/10/2018   Procedure: LEAD REVISION/REPAIR;  Surgeon: [name], MD;  Location: [facility] LAB;  Service: Cardiovascular;  Laterality: N/A;   POLYPECTOMY  06/28/2017   Procedure: POLYPECTOMY;  Surgeon: [name], MD;  Location: [facility] ENDOSCOPY;  Service: Endoscopy;;  ascending and descending colon polyp   PORTACATH PLACEMENT Right 12/23/2020    Procedure: INSERTION PORT-A-CATH;  Surgeon: [name], MD;  Location: [facility];  Service: General;  Laterality: Right;   PROSTATE BIOPSY  02/20/2017   SPACE OAR INSTILLATION N/A 12/24/2017   Procedure: SPACE OAR INSTILLATION;  Surgeon: Festus Aloe, MD;  Location: [facility];  Service: Urology;  Laterality: N/A;   TOTAL KNEE ARTHROPLASTY Right 08/16/2017   Procedure: RIGHT TOTAL KNEE ARTHROPLASTY;  Surgeon: [name], MD;  Location: [facility];  Service: Orthopedics;  Laterality: Right;   UPPER ESOPHAGEAL ENDOSCOPIC ULTRASOUND (EUS) N/A 12/16/2020   Procedure: UPPER ESOPHAGEAL ENDOSCOPIC ULTRASOUND (EUS);  Surgeon: [name], MD;  Location: [facility];  Service: Endoscopy;  Laterality: N/A;     Family History  Problem Relation Age of Onset   Hypertension Mother    Heart disease Mother    Diabetes Mother    Diabetes Father    Hypertension Father  Cancer Father        lung cancer   Healthy Sister    Heart attack Brother    Heart disease Brother 40       + tobacco   Healthy Brother      Social History   Socioeconomic History   Marital status: Married    Spouse name: Not on file   Number of children: 2   Years of education: Not on file   Highest education level: Not on file  Occupational History   Occupation: retired  Tobacco Use   Smoking status: Never   Smokeless tobacco: Never  Vaping Use   Vaping Use: Never used  Substance and Sexual Activity   Alcohol use: No   Drug use: No   Sexual activity: Not Currently  Other Topics Concern   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 Daughter, Beckie Busing, lives in [place]. Son, Itmann, lives in [place].   Social Determinants of Health   Financial Resource Strain: Low Risk  (12/26/2021)   Overall Financial Resource Strain (CARDIA)    Difficulty of Paying Living Expenses: Not very hard  Food Insecurity: No Food Insecurity (12/25/2021)   Hunger Vital Sign     Worried About Running Out of Food in the Last Year: Never true    Ran Out of Food in the Last Year: Never true  Transportation Needs: No Transportation Needs (12/26/2021)   PRAPARE - Hydrologist (Medical): No    Lack of Transportation (Non-Medical): No  Physical Activity: Insufficiently Active (09/26/2021)   Exercise Vital Sign    Days of Exercise per Week: 7 days    Minutes of Exercise per Session: 10 min  Stress: No Stress Concern Present (09/26/2021)   [place]    Feeling of Stress : Not at all  Social Connections: Not on file  Intimate Partner Violence: Not At Risk (12/25/2021)   Humiliation, Afraid, Rape, and Kick questionnaire    Fear of Current or Ex-Partner: No    Emotionally Abused: No    Physically Abused: No    Sexually Abused: No     BP 94/60   Pulse 64   Ht '5\' 6"'$  (1.676 m)   Wt 181 lb (82.1 kg)   SpO2 99%   BMI 29.21 kg/m   Physical Exam:  Well appearing NAD HEENT: Unremarkable Neck:  No JVD, no thyromegally Lymphatics:  No adenopathy Back:  No CVA tenderness Lungs:  Clear HEART:  Regular rate rhythm, no murmurs, no rubs, no clicks Abd:  soft, positive bowel sounds, no organomegally, no rebound, no guarding Ext:  2 plus pulses, no edema, no cyanosis, no clubbing Skin:  No rashes no nodules Neuro:  CN II through XII intact, motor grossly intact  EKG - nsr with ventricular pacing  DEVICE  Normal device function.  See PaceArt for details.   Assess/Plan:  Atrial fib - his VR is well controlled. No change in his meds.  Chronic systolic heart failure - his symptoms remain class 2. No change in meds. ICD - his [employer]. Jude ICD is working normally. He has over 2 years of battery longevity. CAD - he denies anginal symptoms we will follow.   Jon Overlie Kymoni Lesperance,MD

## 2022-03-20 ENCOUNTER — Ambulatory Visit (HOSPITAL_COMMUNITY)
Admission: RE | Admit: 2022-03-20 | Discharge: 2022-03-20 | Disposition: A | Discharge status: Home / Self Care | Payer: Medicare HMO | Source: Ambulatory Visit | Attending: Hematology | Admitting: Hematology

## 2022-03-20 ENCOUNTER — Encounter (HOSPITAL_COMMUNITY): Payer: Self-pay

## 2022-03-20 ENCOUNTER — Other Ambulatory Visit (HOSPITAL_COMMUNITY): Payer: Medicare HMO

## 2022-03-20 DIAGNOSIS — K5730 Diverticulosis of large intestine without perforation or abscess without bleeding: Secondary | ICD-10-CM | POA: Diagnosis not present

## 2022-03-20 DIAGNOSIS — D3502 Benign neoplasm of left adrenal gland: Secondary | ICD-10-CM | POA: Diagnosis not present

## 2022-03-20 DIAGNOSIS — C169 Malignant neoplasm of stomach, unspecified: Secondary | ICD-10-CM | POA: Diagnosis not present

## 2022-03-20 DIAGNOSIS — D3501 Benign neoplasm of right adrenal gland: Secondary | ICD-10-CM | POA: Diagnosis not present

## 2022-03-20 DIAGNOSIS — I700 Atherosclerosis of aorta: Secondary | ICD-10-CM | POA: Diagnosis not present

## 2022-03-20 DIAGNOSIS — C163 Malignant neoplasm of pyloric antrum: Secondary | ICD-10-CM | POA: Insufficient documentation

## 2022-03-20 LAB — POCT I-STAT CREATININE: Creatinine, Ser: 1.3 mg/dL — ABNORMAL HIGH (ref 0.61–1.24)

## 2022-03-20 MED ORDER — IOHEXOL 300 MG/ML  SOLN
100.0000 mL | Freq: Once | INTRAMUSCULAR | Status: AC | PRN
  Administered 2022-03-20: 100 mL via INTRAVENOUS

## 2022-03-20 MED ORDER — SODIUM CHLORIDE (PF) 0.9 % IJ SOLN
INTRAMUSCULAR | Status: AC
  Filled 2022-03-20: qty 50

## 2022-03-21 NOTE — Addendum Note (Signed)
Addended by: Gwendlyn Deutscher on: 03/21/2022 02:25 PM   Modules accepted: Orders

## 2022-03-22 ENCOUNTER — Inpatient Hospital Stay: Payer: Medicare HMO

## 2022-03-22 ENCOUNTER — Inpatient Hospital Stay: Payer: Medicare HMO | Attending: Nurse Practitioner | Admitting: Nurse Practitioner

## 2022-03-22 ENCOUNTER — Encounter: Payer: Self-pay | Admitting: Nurse Practitioner

## 2022-03-22 ENCOUNTER — Other Ambulatory Visit: Payer: Self-pay

## 2022-03-22 DIAGNOSIS — D630 Anemia in neoplastic disease: Secondary | ICD-10-CM | POA: Insufficient documentation

## 2022-03-22 DIAGNOSIS — Z8546 Personal history of malignant neoplasm of prostate: Secondary | ICD-10-CM | POA: Insufficient documentation

## 2022-03-22 DIAGNOSIS — Z95828 Presence of other vascular implants and grafts: Secondary | ICD-10-CM

## 2022-03-22 DIAGNOSIS — C169 Malignant neoplasm of stomach, unspecified: Secondary | ICD-10-CM | POA: Diagnosis not present

## 2022-03-22 DIAGNOSIS — D509 Iron deficiency anemia, unspecified: Secondary | ICD-10-CM | POA: Diagnosis not present

## 2022-03-22 DIAGNOSIS — C163 Malignant neoplasm of pyloric antrum: Secondary | ICD-10-CM

## 2022-03-22 DIAGNOSIS — D500 Iron deficiency anemia secondary to blood loss (chronic): Secondary | ICD-10-CM

## 2022-03-22 LAB — CBC WITH DIFFERENTIAL (CANCER CENTER ONLY)
Abs Immature Granulocytes: 0.01 10*3/uL (ref 0.00–0.07)
Basophils Absolute: 0 10*3/uL (ref 0.0–0.1)
Basophils Relative: 0 %
Eosinophils Absolute: 0.1 10*3/uL (ref 0.0–0.5)
Eosinophils Relative: 1 %
HCT: 41 % (ref 39.0–52.0)
Hemoglobin: 14.2 g/dL (ref 13.0–17.0)
Immature Granulocytes: 0 %
Lymphocytes Relative: 19 %
Lymphs Abs: 1 10*3/uL (ref 0.7–4.0)
MCH: 33.8 pg (ref 26.0–34.0)
MCHC: 34.6 g/dL (ref 30.0–36.0)
MCV: 97.6 fL (ref 80.0–100.0)
Monocytes Absolute: 0.5 10*3/uL (ref 0.1–1.0)
Monocytes Relative: 9 %
Neutro Abs: 3.6 10*3/uL (ref 1.7–7.7)
Neutrophils Relative %: 71 %
Platelet Count: 171 10*3/uL (ref 150–400)
RBC: 4.2 MIL/uL — ABNORMAL LOW (ref 4.22–5.81)
RDW: 13.5 % (ref 11.5–15.5)
WBC Count: 5.2 10*3/uL (ref 4.0–10.5)
nRBC: 0 % (ref 0.0–0.2)

## 2022-03-22 LAB — CMP (CANCER CENTER ONLY)
ALT: 13 U/L (ref 0–44)
AST: 18 U/L (ref 15–41)
Albumin: 4.4 g/dL (ref 3.5–5.0)
Alkaline Phosphatase: 45 U/L (ref 38–126)
Anion gap: 8 (ref 5–15)
BUN: 15 mg/dL (ref 8–23)
CO2: 27 mmol/L (ref 22–32)
Calcium: 9.3 mg/dL (ref 8.9–10.3)
Chloride: 105 mmol/L (ref 98–111)
Creatinine: 1.21 mg/dL (ref 0.61–1.24)
GFR, Estimated: >60 mL/min (ref >60)
Glucose, Bld: 91 mg/dL (ref 70–99)
Potassium: 3.9 mmol/L (ref 3.5–5.1)
Sodium: 140 mmol/L (ref 135–145)
Total Bilirubin: 0.9 mg/dL (ref 0.3–1.2)
Total Protein: 6.8 g/dL (ref 6.5–8.1)

## 2022-03-22 LAB — FERRITIN: Ferritin: 49 ng/mL (ref 24–336)

## 2022-03-22 LAB — CEA (ACCESS): CEA (CHCC): 1.84 ng/mL (ref 0.00–5.00)

## 2022-03-22 LAB — IRON AND IRON BINDING CAPACITY (CC-WL,HP ONLY)
Iron: 72 ug/dL (ref 45–182)
Saturation Ratios: 23 % (ref 17.9–39.5)
TIBC: 315 ug/dL (ref 250–450)
UIBC: 243 ug/dL (ref 117–376)

## 2022-03-22 MED ORDER — SODIUM CHLORIDE 0.9% FLUSH
10.0000 mL | Freq: Once | INTRAVENOUS | Status: AC
  Administered 2022-03-22: 10 mL

## 2022-03-22 MED ORDER — HEPARIN SOD (PORK) LOCK FLUSH 100 UNIT/ML IV SOLN
500.0000 [IU] | Freq: Once | INTRAVENOUS | Status: AC
  Administered 2022-03-22: 500 [IU]

## 2022-03-22 NOTE — Progress Notes (Signed)
Patient Care Team: [name], MD as PCP - General (Internal Medicine) [name], [name], MD as PCP - Cardiology (Cardiology) [name], MD as PCP - Electrophysiology (Cardiology) [name], MD as Consulting Physician (General Surgery) Truitt Merle, MD as Consulting Physician (Hematology) [name], MD as Consulting Physician (Gastroenterology)   CHIEF COMPLAINT: Follow-up gastric cancer  Oncology History Overview Note  Cancer Staging Gastric cancer [facility]) Staging form: Stomach, AJCC 8th Edition - Clinical stage from 12/01/2020: Stage IIB (cT3, cN0, cM0) - Signed by Truitt Merle, MD on 12/20/2020 Stage prefix: Initial diagnosis Total positive nodes: 0  Malignant neoplasm of prostate ([place]) Staging form: Prostate, AJCC 8th Edition - Clinical: Stage IIC (cT2b, cN0, cM0, PSA: 6.7, Grade Group: 4) - Unsigned Prostate specific antigen (PSA) range: Less than 10 Gleason score: 8 Histologic grading system: 5 grade system    Gastric cancer ([place])  12/01/2020 Procedure   Upper Endoscopy, Dr. Benson Norway  Impression: - Normal esophagus. 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 Biopsied. - Normal examined duodenum.   12/01/2020 Pathology Results   FINAL MICROSCOPIC DIAGNOSIS:   A. INCISURA, BIOPSY:  - Adenocarcinoma, moderate to poorly differentiated arising in a  background of chronic gastritis with intestinal metaplasia.    12/01/2020 Imaging   CT CAP  IMPRESSION: Focal wall thickening along the posterior aspect of the gastric antrum, likely corresponding to the patient's newly diagnosed gastric cancer.   No findings suspicious for metastatic disease.   Mild multifocal pneumonia, lower lobe predominant, likely on the basis of aspiration. Trace right pleural effusion.   Fiducial markers along the prostate in this patient with known prostate cancer.   12/01/2020 Cancer Staging   Staging form: Stomach, AJCC 8th Edition - Clinical stage from 12/01/2020: Stage  IIB (cT3, cN0, cM0) - Signed by Truitt Merle, MD on 12/20/2020 Stage prefix: Initial diagnosis Total positive nodes: 0   12/08/2020 Initial Diagnosis   Gastric cancer ([place])   12/16/2020 Procedure   EUS  - Wall thickening was seen in the lesser curve of the stomach and in the antrum of the stomach. The thickening appeared to be primarily within the serosa (Layer 5). T3 N0 Mx. - There was no sign of significant pathology in the entire pancreas. - There was no sign of significant pathology in the common bile duct and in the gallbladder. - There was no evidence of significant pathology in the left lobe of the liver. - Endosonographic images of the left adrenal gland were unremarkable. - The celiac trunk and superior mesenteric artery were endosonographically normal. - The mediastinum was unremarkable endosonographically. - No specimens collected.   01/02/2021 - 03/23/2021 Chemotherapy   Patient is on Treatment Plan : GASTROESOPHAGEAL FLOT q14d X 4 cycles     04/01/2021 Imaging   EXAM: CT ABDOMEN AND PELVIS WITHOUT CONTRAST  IMPRESSION: 1. No acute findings in the abdomen or pelvis. Specifically, no evidence for metastatic disease in the abdomen or pelvis. 2. Stable 16 mm left adrenal adenoma. 3. Small fat containing hernias in the right groin and umbilical region. 4. Bilateral pars interarticularis defects at L4 with 10 mm anterolisthesis of L4 on 5, stable. 5. Aortic Atherosclerosis (ICD10-I70.0).   04/13/2021 Imaging   EXAM: CT CHEST WITHOUT CONTRAST  IMPRESSION: 1. No evidence of metastatic disease in the chest. 2. Enlargement of the main pulmonary artery, as can be seen in pulmonary hypertension. 3. Coronary artery disease.   Aortic Atherosclerosis (ICD10-I70.0).   06/09/2021 Procedure   Upper GI Endoscopy,  Dr. Benson Norway  Findings: -The esophagus was normal. -A medium healed ulcer was found on the lesser curvature of the stomach. Biopsies were taken with a cold forceps for  histology. -The examined duodenum was normal. -Compared to the prior EGD there is a marked improvement in his gastric cancer. There was only evidence of erythema and a central scar at the incisura. Cold biopsies of the area were obtained, but there was no gross evidence of malignancy.  Impression: - Normal esophagus. - Scar in the lesser curvature of the stomach. Biopsied. - Normal examined duodenum.   06/09/2021 Pathology Results   FINAL MICROSCOPIC DIAGNOSIS:   A. STOMACH, BIOPSY:  - Gastric mucosa with acute and chronic inflammation.  - No dysplasia or malignancy identified.    06/16/2021 - 06/16/2021 Chemotherapy   Patient is on Treatment Plan : GASTROESOPHAGEAL Pembrolizumab (200) q21d        CURRENT THERAPY: S/p FLOT4 01/02/2021 - 03/22/2021, posttreatment EUS showed no residual malignancy.  Currently on surveillance  INTERVAL HISTORY [name] returns with his wife for follow-up as scheduled, last seen by Dr. Burr Medico 11/16/2021.  He was admitted in November 2023 for pneumonia and CHF exacerbation, he has recovered.  Doing well in general, trying to exercise some every day.  Energy and appetite are normal.  He has occasional tightness at bilateral rib cage, possibly when he does more exercise.  Neuropathy and dexterity are improving.  Denies unintentional weight loss, nausea/vomiting, change in bowel habits, hematochezia, abdominal pain, or any other new specific complaints.  He underwent surveillance scan earlier this week and is here to discuss results.  ROS  All other systems reviewed and negative  Past Medical History:  Diagnosis Date   AICD (automatic cardioverter/defibrillator) present 05/25/2016   biv icd   Bunion, right    Chronic combined systolic and diastolic CHF (congestive heart failure) ([place])    Echo 1/18: Mild conc LVH, EF 15-20, severe diff HK, inf and inf-septal AK, Gr 3 DD, mild to mod MR, severe LAE, mod reduced RVSF, mod RAE, mild TR, PASP 50   Coronary artery  disease involving native coronary artery without angina pectoris 04/17/2016   LHC 1/18: pLCx 30, mLCx 20, mRCA 40, dRCA 20, LVEDP 23, mean RA 8, PA 42/20, PCWP 17   Diabetes mellitus without complication (HCC)    DJD (degenerative joint disease)    Gastric cancer ([place])    History of atrial fibrillation    History of atrial flutter    History of cardiomegaly 06/07/2016   Noted on CXR   History of colon polyps 06/28/2017   Noted on colonoscopy   LBBB (left bundle branch block)    Nausea vomiting and diarrhea 04/04/2021   NICM (nonischemic cardiomyopathy) ([place])    Echo 1/18:  Mild conc LVH, EF 15-20, severe diff HK, inf and inf-septal AK, Gr 3 DD, mild to mod MR, severe LAE, mod reduced RVSF, mod RAE, mild TR, PASP 50   OA (osteoarthritis)    knee   Other secondary pulmonary hypertension ([place]) 04/17/2016   Prostate cancer ([place]) 2019   Sigmoid diverticulosis 06/28/2017   Noted on colonoscopy     Past Surgical History:  Procedure Laterality Date   BIOPSY  12/01/2020   Procedure: BIOPSY;  Surgeon: [name], MD;  Location[facility] ENDOSCOPY;  Service: Endoscopy;;   BIOPSY  06/09/2021   Procedure: BIOPSY;  Surgeon: [name], MD;  Location: [facility] ENDOSCOPY;  Service: Gastroenterology;;   BIV ICD INSERTION CRT-D N/A 05/25/2016  Procedure: BiV ICD Insertion CRT-D;  Surgeon: [name], MD;  Location: [place] CV LAB;  Service: Cardiovascular;  Laterality: N/A;   CARDIAC CATHETERIZATION N/A 03/02/2016   Procedure: Right/Left Heart Cath and Coronary Angiography;  Surgeon: Nelva Bush, MD;  Location: [place] CV LAB;  Service: Cardiovascular;  Laterality: N/A;   CARDIOVERSION N/A 07/17/2016   Procedure: Cardioversion;  Surgeon: [name], MD;  Location: [place] CV LAB;  Service: Cardiovascular;  Laterality: N/A;   COLONOSCOPY WITH PROPOFOL N/A 06/28/2017   Procedure: COLONOSCOPY WITH PROPOFOL;  Surgeon: [name], MD;  Location: [facility] ENDOSCOPY;  Service: Endoscopy;   Laterality: N/A;   colonscopy  2009   ESOPHAGOGASTRODUODENOSCOPY N/A 12/01/2020   Procedure: ESOPHAGOGASTRODUODENOSCOPY (EGD);  Surgeon: [name], MD;  Location: [place];  Service: Endoscopy;  Laterality: N/A;  IDA/guaiac positive stools   ESOPHAGOGASTRODUODENOSCOPY (EGD) WITH PROPOFOL N/A 12/16/2020   Procedure: ESOPHAGOGASTRODUODENOSCOPY (EGD) WITH PROPOFOL;  Surgeon: [name], MD;  Location: [facility] ENDOSCOPY;  Service: Endoscopy;  Laterality: N/A;   ESOPHAGOGASTRODUODENOSCOPY (EGD) WITH PROPOFOL N/A 06/09/2021   Procedure: ESOPHAGOGASTRODUODENOSCOPY (EGD) WITH PROPOFOL;  Surgeon: [name], MD;  Location: [facility] ENDOSCOPY;  Service: Gastroenterology;  Laterality: N/A;   GOLD SEED IMPLANT N/A 12/24/2017   Procedure: GOLD SEED IMPLANT, TRANSERINEAL;  Surgeon: Festus Aloe, MD;  Location: [facility] ORS;  Service: Urology;  Laterality: N/A;   INSERT / REPLACE / REMOVE PACEMAKER     LEAD REVISION  10/10/2018   LEAD REVISION/REPAIR N/A 10/10/2018   Procedure: LEAD REVISION/REPAIR;  Surgeon: [name], MD;  Location: [place] CV LAB;  Service: Cardiovascular;  Laterality: N/A;   POLYPECTOMY  06/28/2017   Procedure: POLYPECTOMY;  Surgeon: [name], MD;  Location: [facility] ENDOSCOPY;  Service: Endoscopy;;  ascending and descending colon polyp   PORTACATH PLACEMENT Right 12/23/2020   Procedure: INSERTION PORT-A-CATH;  Surgeon: [name], MD;  Location: [place];  Service: General;  Laterality: Right;   PROSTATE BIOPSY  02/20/2017   SPACE OAR INSTILLATION N/A 12/24/2017   Procedure: SPACE OAR INSTILLATION;  Surgeon: Festus Aloe, MD;  Location: [facility];  Service: Urology;  Laterality: N/A;   TOTAL KNEE ARTHROPLASTY Right 08/16/2017   Procedure: RIGHT TOTAL KNEE ARTHROPLASTY;  Surgeon: [name], MD;  Location: [place];  Service: Orthopedics;  Laterality: Right;   UPPER ESOPHAGEAL ENDOSCOPIC ULTRASOUND (EUS) N/A 12/16/2020   Procedure: UPPER ESOPHAGEAL ENDOSCOPIC ULTRASOUND (EUS);   Surgeon: [name], MD;  Location: [facility];  Service: Endoscopy;  Laterality: N/A;     Outpatient Encounter Medications as of 03/22/2022  Medication Sig   atorvastatin (LIPITOR) 40 MG tablet Take 1 tablet (40 mg total) by mouth daily.   Biotin 1000 MCG tablet Take 1,000 mcg by mouth daily with breakfast.   cholecalciferol (VITAMIN D3) 25 MCG (1000 UT) tablet Take 1,000 Units by mouth daily with breakfast.   empagliflozin (JARDIANCE) 10 MG TABS tablet Take 1 tablet (10 mg total) by mouth daily.   furosemide (LASIX) 40 MG tablet Take 1 tablet (40 mg total) by mouth daily.   gabapentin (NEURONTIN) 100 MG capsule TAKE 1 CAPSULE(100 MG) BY MOUTH TWICE DAILY   metFORMIN (GLUCOPHAGE-XR) 500 MG 24 hr tablet Take 500 mg by mouth every evening.   metoprolol succinate (TOPROL-XL) 25 MG 24 hr tablet TAKE 1 TABLET(25 MG) BY MOUTH DAILY   multivitamin (ONE-A-DAY MEN'S) TABS tablet Take 1 tablet by mouth daily with breakfast.   pantoprazole (PROTONIX) 40 MG tablet Take 1 tablet (40 mg total) by  mouth 2 (two) times daily.   potassium chloride (KLOR-CON) 10 MEQ tablet Twice daily   rivaroxaban (XARELTO) 20 MG TABS tablet TAKE 1 TABLET BY MOUTH EVERY DAY WITH SUPPER   sacubitril-valsartan (ENTRESTO) 24-26 MG Take 1 tablet by mouth 2 (two) times daily.   sotalol (BETAPACE) 80 MG tablet TAKE 1 AND 1/2 TABLETS(120 MG) BY MOUTH IN THE MORNING AND AT BEDTIME   spironolactone (ALDACTONE) 25 MG tablet Take 1 tablet (25 mg total) by mouth daily.   [DISCONTINUED] prochlorperazine (COMPAZINE) 10 MG tablet Take 1 tablet (10 mg total) by mouth every 6 (six) hours as needed (Nausea or vomiting).   No facility-administered encounter medications on file as of 03/22/2022.     Today's Vitals   03/22/22 0941  PainSc: 0-No pain   There is no height or weight on file to calculate BMI.   PHYSICAL EXAM GENERAL:alert, no distress and comfortable SKIN: no rash  EYES: sclera clear NECK: without mass LYMPH:  no  palpable cervical or supraclavicular lymphadenopathy  LUNGS: clear with normal breathing effort HEART: regular rate & rhythm, no lower extremity edema ABDOMEN: abdomen soft, non-tender and normal bowel sounds NEURO: alert & oriented x 3 with fluent speech, no focal motor/sensory deficits PAC without erythema    CBC    Component Value Date/Time   WBC 5.9 12/26/2021 0049   RBC 3.92 (L) 12/26/2021 0049   HGB 12.8 (L) 12/26/2021 0049   HGB 11.8 (L) 10/08/2018 0756   HCT 38.6 (L) 12/26/2021 0049   HCT 35.0 (L) 10/08/2018 0756   PLT 168 12/26/2021 0049   PLT 173 10/08/2018 0756   MCV 98.5 12/26/2021 0049   MCV 96 10/08/2018 0756   MCH 32.7 12/26/2021 0049   MCHC 33.2 12/26/2021 0049   RDW 13.8 12/26/2021 0049   RDW 12.6 10/08/2018 0756   LYMPHSABS 0.9 12/25/2021 1145   LYMPHSABS 0.8 04/25/2018 1601   MONOABS 0.7 12/25/2021 1145   EOSABS 0.1 12/25/2021 1145   EOSABS 0.1 04/25/2018 1601   BASOSABS 0.0 12/25/2021 1145   BASOSABS 0.0 04/25/2018 1601     CMP     Component Value Date/Time   NA 141 01/09/2022 1142   NA 141 10/08/2018 0756   K 3.8 01/09/2022 1142   CL 106 01/09/2022 1142   CO2 24 01/09/2022 1142   GLUCOSE 97 01/09/2022 1142   BUN 16 01/09/2022 1142   BUN 19 10/08/2018 0756   CREATININE 1.30 (H) 03/20/2022 1033   CALCIUM 9.8 01/09/2022 1142   PROT 7.4 11/16/2021 0911   PROT 7.3 06/06/2016 1453   ALBUMIN 4.6 11/16/2021 0911   ALBUMIN 4.6 06/06/2016 1453   AST 22 11/16/2021 0911   ALT 24 11/16/2021 0911   ALKPHOS 49 11/16/2021 0911   BILITOT 0.9 11/16/2021 0911   BILITOT 0.6 06/06/2016 1453   GFRNONAA >60 01/09/2022 1142   GFRAA 74 10/08/2018 0756     ASSESSMENT & PLAN: DELVIS KAU is a 72 y.o. male with    1. Gastric Cancer, cT3N0M0, stage II -Diagnosed 11/2020, path showed loss of MLH1 and PMS2 which predicts good response to immunotherapy  -Deemed a high risk surgical candidate.  S/p FLOT4 01/02/2021 - 03/22/2021 -Posttreatment EUS 05/2021 by Dr.  Benson Norway showed a scar and residual erythema, biopsy negative for residual malignancy.  -[name] is clinically doing well on surveillance.  I reviewed recent CT 03/20/22 which shows no evidence of recurrent or metastatic disease, and stable bilateral adrenal adenomas -Port flush and labs from  today are pending. Continue flush q8 weeks while port remains -Continue surveillance.  Will message Dr. Benson Norway to see if he plans any further scheduled surveillance EUS, but likely will be done on as needed clinical basis -F/up in 4 months, or sooner if needed   2.  Neuropathy, secondary to chemotherapy and DM -Onset after cycle 4 FLOT, secondary to oxaliplatin and taxotere -I recommend to restart B complex vitamin. I offered PT referral but he declined, improving on his own   3. H/o Prostate Cancer  -diagnosed with Gleason 4+4 prostate cancer in early 2019. He was treated with long-term ADT in combination with 8 weeks of IMRT. -continue urology f/up   4. Diabetes, atrial fibrillation, severe cardiomyopathy with EF 20 to 25%, status post ICD placement -Follow-up with PCP and cardiology -recently hospitalized 12/2021 for PNA and CHF exacerbation, recovered   5. Iron deficient anemia -Secondary to gastric cancer -received 2 doses of IV Venofer, last 12/20/20 -Today's iron panel is pending    PLAN: -Reviewed surveillance CT 03/20/22, NED -PAC flush and lab today, cont flush q8 weeks while port remains -Continue surveillance  -CC note to Dr. Benson Norway  -F/up in 16 weeks, or sooner if needed   Orders Placed This Encounter  Procedures   CBC with Differential ([place] Only)    Standing Status:   Future    Standing Expiration Date:   03/23/2023   CMP ([facility] only)    Standing Status:   Future    Standing Expiration Date:   03/23/2023   CEA ([facility])-[facility] ONLY    Standing Status:   Future    Standing Expiration Date:   03/23/2023   Ferritin    Standing Status:   Future    Standing Expiration Date:    03/23/2023   Iron and Iron Binding Capacity ([facility] only)    Standing Status:   Future    Standing Expiration Date:   03/23/2023      All questions were answered. The patient knows to call the clinic with any problems, questions or concerns. No barriers to learning were detected. I spent 20 minutes counseling the patient face to face. The total time spent in the appointment was 30 minutes and more than 50% was on counseling, review of test results, and coordination of care.   Cira Rue, NP-C 03/22/2022

## 2022-03-26 ENCOUNTER — Other Ambulatory Visit: Payer: Self-pay | Admitting: Hematology

## 2022-04-02 DIAGNOSIS — R310 Gross hematuria: Secondary | ICD-10-CM | POA: Diagnosis not present

## 2022-04-04 DIAGNOSIS — C169 Malignant neoplasm of stomach, unspecified: Secondary | ICD-10-CM | POA: Diagnosis not present

## 2022-04-09 ENCOUNTER — Other Ambulatory Visit: Payer: Self-pay | Admitting: Gastroenterology

## 2022-04-11 ENCOUNTER — Ambulatory Visit: Payer: Medicare HMO

## 2022-04-11 DIAGNOSIS — I428 Other cardiomyopathies: Secondary | ICD-10-CM

## 2022-04-11 LAB — CUP PACEART REMOTE DEVICE CHECK
Battery Remaining Longevity: 24 mo
Battery Remaining Percentage: 26 %
Battery Voltage: 2.89 V
Brady Statistic AP VP Percent: 0 %
Brady Statistic AP VS Percent: 0 %
Brady Statistic AS VP Percent: 100 %
Brady Statistic AS VS Percent: 0 %
Brady Statistic RA Percent Paced: 1 %
Brady Statistic RV Percent Paced: 97 %
Date Time Interrogation Session: 20240228035607
HighPow Impedance: 57 Ohm
HighPow Impedance: 57 Ohm
Implantable Lead Connection Status: 753985
Implantable Lead Connection Status: 753985
Implantable Lead Implant Date: 20180413
Implantable Lead Implant Date: 20200828
Implantable Lead Location: 753859
Implantable Lead Location: 753860
Implantable Lead Model: 7122
Implantable Pulse Generator Implant Date: 20180413
Lead Channel Impedance Value: 400 Ohm
Lead Channel Impedance Value: 700 Ohm
Lead Channel Pacing Threshold Amplitude: 0.75 V
Lead Channel Pacing Threshold Amplitude: 1 V
Lead Channel Pacing Threshold Pulse Width: 0.5 ms
Lead Channel Pacing Threshold Pulse Width: 0.5 ms
Lead Channel Sensing Intrinsic Amplitude: 2.3 mV
Lead Channel Sensing Intrinsic Amplitude: 6.5 mV
Lead Channel Setting Pacing Amplitude: 2 V
Lead Channel Setting Pacing Amplitude: 2.5 V
Lead Channel Setting Pacing Pulse Width: 0.5 ms
Lead Channel Setting Sensing Sensitivity: 0.5 mV
Pulse Gen Serial Number: 7398151
Zone Setting Status: 755011

## 2022-04-25 ENCOUNTER — Ambulatory Visit: Payer: Medicare HMO | Admitting: Cardiovascular Disease

## 2022-05-09 DIAGNOSIS — L609 Nail disorder, unspecified: Secondary | ICD-10-CM | POA: Diagnosis not present

## 2022-05-09 DIAGNOSIS — B351 Tinea unguium: Secondary | ICD-10-CM | POA: Diagnosis not present

## 2022-05-09 DIAGNOSIS — L603 Nail dystrophy: Secondary | ICD-10-CM | POA: Diagnosis not present

## 2022-05-09 DIAGNOSIS — L03012 Cellulitis of left finger: Secondary | ICD-10-CM | POA: Diagnosis not present

## 2022-05-15 ENCOUNTER — Other Ambulatory Visit: Payer: Self-pay | Admitting: Internal Medicine

## 2022-05-15 DIAGNOSIS — I2510 Atherosclerotic heart disease of native coronary artery without angina pectoris: Secondary | ICD-10-CM

## 2022-05-15 NOTE — Progress Notes (Signed)
Remote ICD transmission.   

## 2022-05-17 ENCOUNTER — Inpatient Hospital Stay: Payer: Medicare HMO | Attending: Nurse Practitioner

## 2022-05-17 ENCOUNTER — Other Ambulatory Visit: Payer: Self-pay

## 2022-05-17 DIAGNOSIS — Z85028 Personal history of other malignant neoplasm of stomach: Secondary | ICD-10-CM | POA: Insufficient documentation

## 2022-05-17 DIAGNOSIS — Z95828 Presence of other vascular implants and grafts: Secondary | ICD-10-CM

## 2022-05-17 DIAGNOSIS — D500 Iron deficiency anemia secondary to blood loss (chronic): Secondary | ICD-10-CM

## 2022-05-17 DIAGNOSIS — Z452 Encounter for adjustment and management of vascular access device: Secondary | ICD-10-CM | POA: Diagnosis not present

## 2022-05-17 MED ORDER — HEPARIN SOD (PORK) LOCK FLUSH 100 UNIT/ML IV SOLN
500.0000 [IU] | Freq: Once | INTRAVENOUS | Status: AC
  Administered 2022-05-17: 500 [IU]

## 2022-05-17 MED ORDER — SODIUM CHLORIDE 0.9% FLUSH
10.0000 mL | Freq: Once | INTRAVENOUS | Status: AC
  Administered 2022-05-17: 10 mL

## 2022-05-23 ENCOUNTER — Other Ambulatory Visit: Payer: Self-pay | Admitting: Nurse Practitioner

## 2022-05-28 ENCOUNTER — Other Ambulatory Visit: Payer: Self-pay | Admitting: Internal Medicine

## 2022-06-14 DIAGNOSIS — R310 Gross hematuria: Secondary | ICD-10-CM | POA: Diagnosis not present

## 2022-06-14 DIAGNOSIS — Z8546 Personal history of malignant neoplasm of prostate: Secondary | ICD-10-CM | POA: Diagnosis not present

## 2022-06-14 DIAGNOSIS — N401 Enlarged prostate with lower urinary tract symptoms: Secondary | ICD-10-CM | POA: Diagnosis not present

## 2022-06-14 DIAGNOSIS — R350 Frequency of micturition: Secondary | ICD-10-CM | POA: Diagnosis not present

## 2022-06-21 ENCOUNTER — Encounter: Payer: Self-pay | Admitting: Internal Medicine

## 2022-06-21 ENCOUNTER — Encounter (HOSPITAL_COMMUNITY): Payer: Self-pay | Admitting: Gastroenterology

## 2022-06-21 NOTE — Progress Notes (Signed)
PERIOPERATIVE PRESCRIPTION FOR IMPLANTED CARDIAC DEVICE PROGRAMMING  Patient Information: Name:  Jon Williams  DOB:  1950-10-23  MRN:  [id number]    Planned Procedure:EGD  Date of Procedure:  06/29/22  Physician- DR Arna Medici will be used.  Position during surgery:   Please send documentation back to:  [facility] (Fax # [phone])  Device Information:  Clinic EP Physician:  Lewayne Bunting, MD   Device Type:  Defibrillator Manufacturer and Phone #:  St. Jude/[employer]ott: [phone] Pacemaker Dependent?:  Unknown Date of Last Device Check:  04/11/22 Normal Device Function?:  Yes.    Electrophysiologist's Recommendations:  Have magnet available. Provide continuous ECG monitoring when magnet is used or reprogramming is to be performed.  Procedure may interfere with device function.  Magnet should be placed over device during procedure.  Per Device Clinic Standing Orders, Lenor Coffin, RN  11:09 AM 06/21/2022

## 2022-06-21 NOTE — Progress Notes (Addendum)
Anesthesia Review:  PCP: Renford Dills  Cardiologist : DR Lewayne Bunting- LOV 03/13/22 DR nahser- LOV 03/12/22  Chest x-ray : 2v- 12/25/21  CT Chest- 03/20/22  EKG : 03/13/22  ICD-  Last Device Check- 04/11/22 device orders requested and in epic on 06/21/22  Echo : 09/13/21  Stress test: Cardiac Cath :  Activity level:  Sleep Study/ CPAP : Fasting Blood Sugar :      / Checks Blood Sugar -- times a day:   Blood Thinner/ Instructions /Last Dose: ASA / Instructions/ Last Dose :   Xarelto- at time of preop phone call on 06/21/22 pt has not yet received preop instrucitons.  PT to call office of DR Elnoria Howard for instructoins.  PT voiced understanding.   Wife with pt on preop phone call of 06/21/22    DM- type 2  Checks glucose daily at home Jardiance- hold for 72 hours prior to procedure- Last dose on 06/25/22  Metformin- none day of procedure  PT has PORT- right side ICD on left side

## 2022-06-25 ENCOUNTER — Other Ambulatory Visit: Payer: Self-pay | Admitting: Nurse Practitioner

## 2022-06-25 ENCOUNTER — Telehealth: Payer: Self-pay | Admitting: *Deleted

## 2022-06-25 DIAGNOSIS — E1142 Type 2 diabetes mellitus with diabetic polyneuropathy: Secondary | ICD-10-CM | POA: Diagnosis not present

## 2022-06-25 DIAGNOSIS — M792 Neuralgia and neuritis, unspecified: Secondary | ICD-10-CM | POA: Diagnosis not present

## 2022-06-25 DIAGNOSIS — I739 Peripheral vascular disease, unspecified: Secondary | ICD-10-CM | POA: Diagnosis not present

## 2022-06-25 NOTE — Telephone Encounter (Signed)
   Pre-operative Risk Assessment    Patient Name: Jon Williams  DOB: Feb 16, 1950 MRN: [id number]      Request for Surgical Clearance    Procedure:   EGD  Date of Surgery:  Clearance 06/29/22                                 Surgeon:  DR. HUNG Surgeon's Group or Practice Name:  [facility] Phone number:  [phone] Fax number:  [phone]   Type of Clearance Requested:   - Medical  - Pharmacy:  Hold Rivaroxaban (Xarelto)     Type of Anesthesia:   PROPOFOL   Additional requests/questions:    Elpidio Anis   06/25/2022, 9:59 AM

## 2022-06-25 NOTE — Telephone Encounter (Signed)
Patient with diagnosis of afib on Xarelto for anticoagulation.    Procedure: EGD Date of procedure: 06/29/22  CHA2DS2-VASc Score = 5  This indicates a 7.2% annual risk of stroke. The patient's score is based upon: CHF History: 1 HTN History: 1 Diabetes History: 1 Stroke History: 0 Vascular Disease History: 1 Age Score: 1 Gender Score: 0  CrCl 25mL/min Platelet count 171K  Per office protocol, patient can hold Xarelto for 1-2 days prior to procedure.    **This guidance is not considered finalized until pre-operative APP has relayed final recommendations.**

## 2022-06-25 NOTE — Telephone Encounter (Signed)
Pt has been added on to pre op tele schedule  Wed 06/27/22 10:40 pre pre op APP.  Med rec and consent are done.

## 2022-06-25 NOTE — Telephone Encounter (Signed)
Pt has been added on to pre op tele schedule  Wed 06/27/22 10:40 pre pre op APP.  Med rec and consent are done.    Patient Consent for Virtual Visit        Jon Williams has provided verbal consent on 06/25/2022 for a virtual visit (video or telephone).   CONSENT FOR VIRTUAL VISIT FOR:  Jon Williams  By participating in this virtual visit I agree to the following:  I hereby voluntarily request, consent and authorize [facility] and its employed or contracted physicians, physician assistants, nurse practitioners or other licensed health care professionals (the Practitioner), to provide me with telemedicine health care services (the "Services") as deemed necessary by the treating Practitioner. I acknowledge and consent to receive the Services by the Practitioner via telemedicine. I understand that the telemedicine visit will involve communicating with the Practitioner through live audiovisual communication technology and the disclosure of certain medical information by electronic transmission. I acknowledge that I have been given the opportunity to request an in-person assessment or other available alternative prior to the telemedicine visit and am voluntarily participating in the telemedicine visit.  I understand that I have the right to withhold or withdraw my consent to the use of telemedicine in the course of my care at any time, without affecting my right to future care or treatment, and that the Practitioner or I may terminate the telemedicine visit at any time. I understand that I have the right to inspect all information obtained and/or recorded in the course of the telemedicine visit and may receive copies of available information for a reasonable fee.  I understand that some of the potential risks of receiving the Services via telemedicine include:  Delay or interruption in medical evaluation due to technological equipment failure or disruption; Information transmitted may  not be sufficient (e.g. poor resolution of images) to allow for appropriate medical decision making by the Practitioner; and/or  In rare instances, security protocols could fail, causing a breach of personal health information.  Furthermore, I acknowledge that it is my responsibility to provide information about my medical history, conditions and care that is complete and accurate to the best of my ability. I acknowledge that Practitioner's advice, recommendations, and/or decision may be based on factors not within their control, such as incomplete or inaccurate data provided by me or distortions of diagnostic images or specimens that may result from electronic transmissions. I understand that the practice of medicine is not an exact science and that Practitioner makes no warranties or guarantees regarding treatment outcomes. I acknowledge that a copy of this consent can be made available to me via my patient portal [facility] MyChart), or I can request a printed copy by calling the office of [facility].    I understand that my insurance will be billed for this visit.   I have read or had this consent read to me. I understand the contents of this consent, which adequately explains the benefits and risks of the Services being provided via telemedicine.  I have been provided ample opportunity to ask questions regarding this consent and the Services and have had my questions answered to my satisfaction. I give my informed consent for the services to be provided through the use of telemedicine in my medical care

## 2022-06-25 NOTE — Telephone Encounter (Signed)
   Name: [name]  DOB: Jan 17, 1951  MRN: [id number]  Primary Cardiologist: Kristeen Miss, MD  Chart reviewed as part of pre-operative protocol coverage. Because of [name]'s past medical history and time since last visit, he will require a follow-up telephone visit in order to better assess preoperative cardiovascular risk.  Pre-op covering staff: - Please schedule appointment and call patient to inform them. If patient already had an upcoming appointment within acceptable timeframe, please add "pre-op clearance" to the appointment notes so provider is aware. - Please contact requesting surgeon's office via preferred method (i.e, phone, fax) to inform them of need for appointment prior to surgery.  Per office protocol, patient can hold Xarelto for 1-2 days prior to procedure.      Sharlene Dory, PA-C  06/25/2022, 12:47 PM

## 2022-06-27 ENCOUNTER — Encounter: Payer: Self-pay | Admitting: Nurse Practitioner

## 2022-06-27 ENCOUNTER — Ambulatory Visit: Payer: Medicare HMO | Attending: Cardiovascular Disease | Admitting: Nurse Practitioner

## 2022-06-27 ENCOUNTER — Encounter: Payer: Self-pay | Admitting: Hematology

## 2022-06-27 DIAGNOSIS — Z01810 Encounter for preprocedural cardiovascular examination: Secondary | ICD-10-CM

## 2022-06-27 NOTE — Progress Notes (Signed)
Virtual Visit via Telephone Note   Because of Jon Williams's co-morbid illnesses, he is at least at moderate risk for complications without adequate follow up.  This format is felt to be most appropriate for this patient at this time.  The patient did not have access to video technology/had technical difficulties with video requiring transitioning to audio format only (telephone).  All issues noted in this document were discussed and addressed.  No physical exam could be performed with this format.  Please refer to the patient's chart for his consent to telehealth for [facility].  Evaluation Performed:  Preoperative cardiovascular risk assessment _____________   Date:  06/27/2022   Patient ID:  Jon Williams, DOB 01-Jul-1950, MRN [id number] Patient Location:  Home Provider location:   Office  Primary Care Provider:  Renford Dills, MD Primary Cardiologist:  Kristeen Miss, MD  Chief Complaint / Patient Profile   72 y.o. y/o male with a h/o NICM s/p ICD implant, nonobstructive CAD by cardiac cath 2018, persistent atrial fibrillation, HTN, gastric cancer, type 2 DM, who is pending EGD and presents today for telephonic preoperative cardiovascular risk assessment.  History of Present Illness    Jon Williams is a 72 y.o. male who presents via audio/video conferencing for a telehealth visit today.  Pt was last seen in cardiology clinic on 03/13/22 by Dr. Ladona Ridgel.  At that time Jon Williams was doing well.  The patient is now pending procedure as outlined above. Since his last visit, he denies chest pain, shortness of breath, lower extremity edema, fatigue, palpitations, melena, hematuria, hemoptysis, diaphoresis, weakness, presyncope, syncope, orthopnea, and PND. Reports he is active walking laps in the house and PT exercises at home without concerning cardiac symptoms.   Past Medical History    Past Medical History:  Diagnosis Date   AICD (automatic  cardioverter/defibrillator) present 05/25/2016   biv icd   Bunion, right    Chronic combined systolic and diastolic CHF (congestive heart failure) (HCC)    Echo 1/18: Mild conc LVH, EF 15-20, severe diff HK, inf and inf-septal AK, Gr 3 DD, mild to mod MR, severe LAE, mod reduced RVSF, mod RAE, mild TR, PASP 50   Coronary artery disease involving native coronary artery without angina pectoris 04/17/2016   LHC 1/18: pLCx 30, mLCx 20, mRCA 40, dRCA 20, LVEDP 23, mean RA 8, PA 42/20, PCWP 17   Diabetes mellitus without complication (HCC)    Gastric cancer (HCC)    History of atrial fibrillation    History of atrial flutter    History of cardiomegaly 06/07/2016   Noted on CXR   History of colon polyps 06/28/2017   Noted on colonoscopy   Hypertension    LBBB (left bundle branch block)    Nausea vomiting and diarrhea 04/04/2021   NICM (nonischemic cardiomyopathy) (HCC)    Echo 1/18:  Mild conc LVH, EF 15-20, severe diff HK, inf and inf-septal AK, Gr 3 DD, mild to mod MR, severe LAE, mod reduced RVSF, mod RAE, mild TR, PASP 50   Other secondary pulmonary hypertension (HCC) 04/17/2016   Prostate cancer (HCC) 2019   Sigmoid diverticulosis 06/28/2017   Noted on colonoscopy   Past Surgical History:  Procedure Laterality Date   BIOPSY  12/01/2020   Procedure: BIOPSY;  Surgeon: Jeani Hawking, MD;  Location: [facility] ENDOSCOPY;  Service: Endoscopy;;   BIOPSY  06/09/2021   Procedure: BIOPSY;  Surgeon: Jeani Hawking, MD;  Location: [facility] ENDOSCOPY;  Service: Gastroenterology;;  BIV ICD INSERTION CRT-D N/A 05/25/2016   Procedure: BiV ICD Insertion CRT-D;  Surgeon: Marinus Maw, MD;  Location: [facility] INVASIVE CV LAB;  Service: Cardiovascular;  Laterality: N/A;   CARDIAC CATHETERIZATION N/A 03/02/2016   Procedure: Right/Left Heart Cath and Coronary Angiography;  Surgeon: Yvonne Kendall, MD;  Location: [facility] INVASIVE CV LAB;  Service: Cardiovascular;  Laterality: N/A;   CARDIOVERSION N/A 07/17/2016   Procedure:  Cardioversion;  Surgeon: Marinus Maw, MD;  Location: [facility] INVASIVE CV LAB;  Service: Cardiovascular;  Laterality: N/A;   COLONOSCOPY WITH PROPOFOL N/A 06/28/2017   Procedure: COLONOSCOPY WITH PROPOFOL;  Surgeon: Jeani Hawking, MD;  Location: [facility] ENDOSCOPY;  Service: Endoscopy;  Laterality: N/A;   colonscopy  2009   ESOPHAGOGASTRODUODENOSCOPY N/A 12/01/2020   Procedure: ESOPHAGOGASTRODUODENOSCOPY (EGD);  Surgeon: Jeani Hawking, MD;  Location: [facility] ENDOSCOPY;  Service: Endoscopy;  Laterality: N/A;  IDA/guaiac positive stools   ESOPHAGOGASTRODUODENOSCOPY (EGD) WITH PROPOFOL N/A 12/16/2020   Procedure: ESOPHAGOGASTRODUODENOSCOPY (EGD) WITH PROPOFOL;  Surgeon: Jeani Hawking, MD;  Location: [facility] ENDOSCOPY;  Service: Endoscopy;  Laterality: N/A;   ESOPHAGOGASTRODUODENOSCOPY (EGD) WITH PROPOFOL N/A 06/09/2021   Procedure: ESOPHAGOGASTRODUODENOSCOPY (EGD) WITH PROPOFOL;  Surgeon: Jeani Hawking, MD;  Location: [facility] ENDOSCOPY;  Service: Gastroenterology;  Laterality: N/A;   GOLD SEED IMPLANT N/A 12/24/2017   Procedure: GOLD SEED IMPLANT, TRANSERINEAL;  Surgeon: Jerilee Field, MD;  Location: [facility] ORS;  Service: Urology;  Laterality: N/A;   INSERT / REPLACE / REMOVE PACEMAKER     LEAD REVISION  10/10/2018   LEAD REVISION/REPAIR N/A 10/10/2018   Procedure: LEAD REVISION/REPAIR;  Surgeon: Marinus Maw, MD;  Location: [facility] INVASIVE CV LAB;  Service: Cardiovascular;  Laterality: N/A;   POLYPECTOMY  06/28/2017   Procedure: POLYPECTOMY;  Surgeon: Jeani Hawking, MD;  Location: [facility] ENDOSCOPY;  Service: Endoscopy;;  ascending and descending colon polyp   PORTACATH PLACEMENT Right 12/23/2020   Procedure: INSERTION PORT-A-CATH;  Surgeon: Fritzi Mandes, MD;  Location: [name] OR;  Service: General;  Laterality: Right;   PROSTATE BIOPSY  02/20/2017   SPACE OAR INSTILLATION N/A 12/24/2017   Procedure: SPACE OAR INSTILLATION;  Surgeon: Jerilee Field, MD;  Location: [facility] ORS;  Service: Urology;  Laterality: N/A;   TOTAL KNEE  ARTHROPLASTY Right 08/16/2017   Procedure: RIGHT TOTAL KNEE ARTHROPLASTY;  Surgeon: Frederico Hamman, MD;  Location: [facility] OR;  Service: Orthopedics;  Laterality: Right;   UPPER ESOPHAGEAL ENDOSCOPIC ULTRASOUND (EUS) N/A 12/16/2020   Procedure: UPPER ESOPHAGEAL ENDOSCOPIC ULTRASOUND (EUS);  Surgeon: Jeani Hawking, MD;  Location: [facility] ENDOSCOPY;  Service: Endoscopy;  Laterality: N/A;    Allergies  Allergies  Allergen Reactions   Aspirin Anaphylaxis and Hives   Sulfa Antibiotics Anaphylaxis, Hives, Swelling and Other (See Comments)    Swollen lips    Home Medications    Prior to Admission medications   Medication Sig Start Date End Date Taking? Authorizing Provider  atorvastatin (LIPITOR) 40 MG tablet TAKE 1 TABLET(40 MG) BY MOUTH DAILY 05/15/22   Marinus Maw, MD  Biotin 1000 MCG tablet Take 1,000 mcg by mouth daily with breakfast.    [provider]  cholecalciferol (VITAMIN D3) 25 MCG (1000 UT) tablet Take 1,000 Units by mouth daily with breakfast.    [provider]  empagliflozin (JARDIANCE) 10 MG TABS tablet Take 1 tablet (10 mg total) by mouth daily. 10/06/21   [name], Deloris Ping, MD  furosemide (LASIX) 40 MG tablet Take 1 tablet (40 mg total) by mouth daily. 12/27/21   Vassie Loll, MD  gabapentin (NEURONTIN)  100 MG capsule TAKE 1 CAPSULE(100 MG) BY MOUTH TWICE DAILY 06/25/22   Pollyann Samples, NP  metFORMIN (GLUCOPHAGE-XR) 500 MG 24 hr tablet Take 500 mg by mouth every evening. 06/13/20   [provider]  metoprolol succinate (TOPROL-XL) 25 MG 24 hr tablet TAKE 1 TABLET(25 MG) BY MOUTH DAILY 05/23/22   Jojuan Champney, Zachary George, NP  multivitamin (ONE-A-DAY MEN'S) TABS tablet Take 1 tablet by mouth daily with breakfast.    [provider]  pantoprazole (PROTONIX) 40 MG tablet Take 1 tablet (40 mg total) by mouth 2 (two) times daily. 12/02/20   Glade Lloyd, MD  potassium chloride (KLOR-CON) 10 MEQ tablet TAKE 1 TABLET BY MOUTH TWICE DAILY 05/29/22   Marinus Maw, MD  rivaroxaban (XARELTO) 20 MG TABS tablet TAKE 1 TABLET BY MOUTH EVERY DAY WITH SUPPER 03/02/22   [name], Deloris Ping, MD  sacubitril-valsartan (ENTRESTO) 24-26 MG Take 1 tablet by mouth 2 (two) times daily. 12/13/21   Marinus Maw, MD  sotalol (BETAPACE) 80 MG tablet TAKE 1 AND 1/2 TABLETS(120 MG) BY MOUTH IN THE MORNING AND AT BEDTIME 03/13/22   Marinus Maw, MD  spironolactone (ALDACTONE) 25 MG tablet Take 1 tablet (25 mg total) by mouth daily. 03/13/22   Marinus Maw, MD  prochlorperazine (COMPAZINE) 10 MG tablet Take 1 tablet (10 mg total) by mouth every 6 (six) hours as needed (Nausea or vomiting). 02/15/21 05/12/21  Malachy Mood, MD    Physical Exam    Vital Signs:  Jon Williams does not have vital signs available for review today.  Given telephonic nature of communication, physical exam is limited. AAOx3. NAD. Normal affect.  Speech and respirations are unlabored.  Accessory Clinical Findings    None  Assessment & Plan    1.  Preoperative Cardiovascular Risk Assessment: According to the Revised Cardiac Risk Index (RCRI), his Perioperative Risk of Major Cardiac Event is (%): 0.9. His Functional Capacity in METs is: 5.07 according to the Duke Activity Status Index (DASI). The patient is doing well from a cardiac perspective. Therefore, based on ACC/AHA guidelines, the patient would be at acceptable risk for the planned procedure without further cardiovascular testing.   The patient was advised that if he develops new symptoms prior to surgery to contact our office to arrange for a follow-up visit, and he verbalized understanding.  Per office protocol, patient can hold Xarelto for 1-2 days prior to procedure.    A copy of this note will be routed to requesting surgeon.  Time:   Today, I have spent 7 minutes with the patient with telehealth technology discussing medical history, symptoms, and management plan.    Levi Aland, NP-C  06/27/2022, 10:40 AM [address] Office [phone] Fax [phone]

## 2022-06-29 ENCOUNTER — Other Ambulatory Visit: Payer: Self-pay

## 2022-06-29 ENCOUNTER — Ambulatory Visit (HOSPITAL_BASED_OUTPATIENT_CLINIC_OR_DEPARTMENT_OTHER): Payer: Medicare HMO | Admitting: Anesthesiology

## 2022-06-29 ENCOUNTER — Encounter (HOSPITAL_COMMUNITY)
Admission: RE | Disposition: A | Discharge status: Home / Self Care | Payer: Self-pay | Source: Home / Self Care | Attending: Gastroenterology

## 2022-06-29 ENCOUNTER — Ambulatory Visit (HOSPITAL_COMMUNITY)
Admission: RE | Admit: 2022-06-29 | Discharge: 2022-06-29 | Disposition: A | Discharge status: Home / Self Care | Payer: Medicare HMO | Attending: Gastroenterology | Admitting: Gastroenterology

## 2022-06-29 ENCOUNTER — Ambulatory Visit (HOSPITAL_COMMUNITY): Payer: Medicare HMO | Admitting: Anesthesiology

## 2022-06-29 ENCOUNTER — Encounter (HOSPITAL_COMMUNITY): Payer: Self-pay | Admitting: Gastroenterology

## 2022-06-29 DIAGNOSIS — I110 Hypertensive heart disease with heart failure: Secondary | ICD-10-CM

## 2022-06-29 DIAGNOSIS — D649 Anemia, unspecified: Secondary | ICD-10-CM | POA: Insufficient documentation

## 2022-06-29 DIAGNOSIS — K31A19 Gastric intestinal metaplasia without dysplasia, unspecified site: Secondary | ICD-10-CM | POA: Diagnosis not present

## 2022-06-29 DIAGNOSIS — E119 Type 2 diabetes mellitus without complications: Secondary | ICD-10-CM | POA: Insufficient documentation

## 2022-06-29 DIAGNOSIS — Z85028 Personal history of other malignant neoplasm of stomach: Secondary | ICD-10-CM | POA: Diagnosis not present

## 2022-06-29 DIAGNOSIS — Z86018 Personal history of other benign neoplasm: Secondary | ICD-10-CM | POA: Diagnosis not present

## 2022-06-29 DIAGNOSIS — I5042 Chronic combined systolic (congestive) and diastolic (congestive) heart failure: Secondary | ICD-10-CM | POA: Diagnosis not present

## 2022-06-29 DIAGNOSIS — K31A12 Gastric intestinal metaplasia without dysplasia, involving the body (corpus): Secondary | ICD-10-CM | POA: Insufficient documentation

## 2022-06-29 DIAGNOSIS — I2729 Other secondary pulmonary hypertension: Secondary | ICD-10-CM | POA: Insufficient documentation

## 2022-06-29 DIAGNOSIS — D759 Disease of blood and blood-forming organs, unspecified: Secondary | ICD-10-CM | POA: Diagnosis not present

## 2022-06-29 DIAGNOSIS — K2970 Gastritis, unspecified, without bleeding: Secondary | ICD-10-CM | POA: Diagnosis not present

## 2022-06-29 DIAGNOSIS — I2510 Atherosclerotic heart disease of native coronary artery without angina pectoris: Secondary | ICD-10-CM | POA: Insufficient documentation

## 2022-06-29 DIAGNOSIS — K3189 Other diseases of stomach and duodenum: Secondary | ICD-10-CM | POA: Insufficient documentation

## 2022-06-29 DIAGNOSIS — I509 Heart failure, unspecified: Secondary | ICD-10-CM | POA: Diagnosis not present

## 2022-06-29 DIAGNOSIS — K2950 Unspecified chronic gastritis without bleeding: Secondary | ICD-10-CM | POA: Diagnosis not present

## 2022-06-29 DIAGNOSIS — Z7984 Long term (current) use of oral hypoglycemic drugs: Secondary | ICD-10-CM | POA: Diagnosis not present

## 2022-06-29 DIAGNOSIS — Z95810 Presence of automatic (implantable) cardiac defibrillator: Secondary | ICD-10-CM | POA: Insufficient documentation

## 2022-06-29 DIAGNOSIS — Z08 Encounter for follow-up examination after completed treatment for malignant neoplasm: Secondary | ICD-10-CM | POA: Diagnosis not present

## 2022-06-29 HISTORY — DX: Essential (primary) hypertension: I10

## 2022-06-29 HISTORY — PX: ESOPHAGOGASTRODUODENOSCOPY (EGD) WITH PROPOFOL: SHX5813

## 2022-06-29 HISTORY — PX: BIOPSY: SHX5522

## 2022-06-29 LAB — GLUCOSE, CAPILLARY
Glucose-Capillary: 110 mg/dL — ABNORMAL HIGH (ref 70–99)
Glucose-Capillary: 168 mg/dL — ABNORMAL HIGH (ref 70–99)

## 2022-06-29 SURGERY — ESOPHAGOGASTRODUODENOSCOPY (EGD) WITH PROPOFOL
Anesthesia: Monitor Anesthesia Care

## 2022-06-29 MED ORDER — EPINEPHRINE 1 MG/10ML IJ SOSY
PREFILLED_SYRINGE | INTRAMUSCULAR | Status: DC | PRN
  Administered 2022-06-29 (×2): 20 ug via INTRAVENOUS
  Administered 2022-06-29: 10 ug via INTRAVENOUS
  Administered 2022-06-29: 20 ug via INTRAVENOUS
  Administered 2022-06-29: 50 ug via INTRAVENOUS
  Administered 2022-06-29 (×2): 20 ug via INTRAVENOUS

## 2022-06-29 MED ORDER — DEXMEDETOMIDINE HCL IN NACL 80 MCG/20ML IV SOLN
INTRAVENOUS | Status: DC | PRN
  Administered 2022-06-29: 6 ug via INTRAVENOUS

## 2022-06-29 MED ORDER — LACTATED RINGERS IV SOLN: INTRAVENOUS | Status: DC | PRN

## 2022-06-29 MED ORDER — SODIUM CHLORIDE 0.9 % IV SOLN: INTRAVENOUS | Status: DC

## 2022-06-29 MED ORDER — ETOMIDATE 2 MG/ML IV SOLN
INTRAVENOUS | Status: DC | PRN
  Administered 2022-06-29: 8 mg via INTRAVENOUS

## 2022-06-29 MED ORDER — LACTATED RINGERS IV SOLN
INTRAVENOUS | Status: AC | PRN
  Administered 2022-06-29: 1000 mL via INTRAVENOUS

## 2022-06-29 MED ORDER — MIDAZOLAM HCL 2 MG/2ML IJ SOLN
INTRAMUSCULAR | Status: AC
  Filled 2022-06-29: qty 2

## 2022-06-29 MED ORDER — VASOPRESSIN 20 UNIT/ML IV SOLN
INTRAVENOUS | Status: DC | PRN
  Administered 2022-06-29 (×20): 1 [IU] via INTRAVENOUS

## 2022-06-29 MED ORDER — VASOPRESSIN 20 UNIT/ML IV SOLN
INTRAVENOUS | Status: AC
  Filled 2022-06-29: qty 1

## 2022-06-29 MED ORDER — EPHEDRINE SULFATE (PRESSORS) 50 MG/ML IJ SOLN
INTRAMUSCULAR | Status: DC | PRN
  Administered 2022-06-29: 10 mg via INTRAVENOUS
  Administered 2022-06-29 (×2): 20 mg via INTRAVENOUS

## 2022-06-29 MED ORDER — GLYCOPYRROLATE 0.2 MG/ML IJ SOLN
INTRAMUSCULAR | Status: DC | PRN
  Administered 2022-06-29: .2 mg via INTRAVENOUS

## 2022-06-29 SURGICAL SUPPLY — 15 items

## 2022-06-29 NOTE — Op Note (Signed)
[facility] Patient Name: Jon Williams Procedure Date: 06/29/2022 MRN: [id number] Attending MD: Jeani Hawking , MD, [id number] Date of Birth: 1950-11-17 CSN: [id number] Age: 72 Admit Type: Outpatient Procedure:                Upper GI endoscopy Indications:              Surveillance for malignancy due to personal history                            of gastric adenoma Providers:                Jeani Hawking, MD, Martha Clan, RN, Norman Clay,                            RN, Irene Shipper, Technician, Deri Fuelling, CRNA Referring MD:              Medicines:                Propofol per Anesthesia Complications:            No immediate complications. Estimated Blood Loss:     Estimated blood loss: none. Procedure:                Pre-Anesthesia Assessment:                           - Prior to the procedure, a History and Physical                            was performed, and patient medications and                            allergies were reviewed. The patient's tolerance of                            previous anesthesia was also reviewed. The risks                            and benefits of the procedure and the sedation                            options and risks were discussed with the patient.                            All questions were answered, and informed consent                            was obtained. Prior Anticoagulants: The patient has                            taken no anticoagulant or antiplatelet agents. ASA                            Grade Assessment: III - A patient with severe  systemic disease. After reviewing the risks and                            benefits, the patient was deemed in satisfactory                            condition to undergo the procedure.                           - Sedation was administered by an anesthesia                            professional. Deep sedation was attained.                            After obtaining informed consent, the endoscope was                            passed under direct vision. Throughout the                            procedure, the patient's blood pressure, pulse, and                            oxygen saturations were monitored continuously. The                            [id number]-[id number] ([id number]) Olympus endoscope was introduced                            through the mouth, and advanced to the second part                            of duodenum. The upper GI endoscopy was                            accomplished without difficulty. The patient                            tolerated the procedure well. Scope In: Scope Out: Findings:      The esophagus was normal.      A scar was found on the lesser curvature of the stomach. The scar tissue       was healthy in appearance. Biopsies were taken with a cold forceps for       histology.      The examined duodenum was normal.      The patient was hypotensive with the procedure and anesthesia applied       vasopressin. The mucosa at the lesser curvature did not show any obvious       evidence of recurrent or residual cancer. Scarring was noted and       biopsies were obtained. Impression:               - Normal esophagus.                           -  Scar in the lesser curvature of the stomach.                            Biopsied.                           - Normal examined duodenum. Moderate Sedation:      Not Applicable - Patient had care per Anesthesia. Recommendation:           - Patient has a contact number available for                            emergencies. The signs and symptoms of potential                            delayed complications were discussed with the                            patient. Return to normal activities tomorrow.                            Written discharge instructions were provided to the                            patient.                           - Resume previous diet.                            - Continue present medications.                           - Await pathology results. Procedure Code(s):        --- Professional ---                           [phone], Esophagogastroduodenoscopy, flexible,                            transoral; with biopsy, single or multiple Diagnosis Code(s):        --- Professional ---                           K31.89, Other diseases of stomach and duodenum                           Z86.018, Personal history of other benign neoplasm CPT copyright 2022 American [employer] Association. All rights reserved. The codes documented in this report are preliminary and upon coder review may  be revised to meet current compliance requirements. Jeani Hawking, MD Jeani Hawking, MD 06/29/2022 8:04:43 AM This report has been signed electronically. Number of Addenda: 0

## 2022-06-29 NOTE — H&P (Signed)
[name] HPI: He is well.  His last EGD on 06/09/2021 showed a small scar in the lesser curvature of the stomach.  He is walking 12 times around the house on a daily basis.  Past Medical History:  Diagnosis Date   AICD (automatic cardioverter/defibrillator) present 05/25/2016   biv icd   Bunion, right    Chronic combined systolic and diastolic CHF (congestive heart failure) (HCC)    Echo 1/18: Mild conc LVH, EF 15-20, severe diff HK, inf and inf-septal AK, Gr 3 DD, mild to mod MR, severe LAE, mod reduced RVSF, mod RAE, mild TR, PASP 50   Coronary artery disease involving native coronary artery without angina pectoris 04/17/2016   LHC 1/18: pLCx 30, mLCx 20, mRCA 40, dRCA 20, LVEDP 23, mean RA 8, PA 42/20, PCWP 17   Diabetes mellitus without complication (HCC)    Gastric cancer (HCC)    History of atrial fibrillation    History of atrial flutter    History of cardiomegaly 06/07/2016   Noted on CXR   History of colon polyps 06/28/2017   Noted on colonoscopy   Hypertension    LBBB (left bundle branch block)    Nausea vomiting and diarrhea 04/04/2021   NICM (nonischemic cardiomyopathy) (HCC)    Echo 1/18:  Mild conc LVH, EF 15-20, severe diff HK, inf and inf-septal AK, Gr 3 DD, mild to mod MR, severe LAE, mod reduced RVSF, mod RAE, mild TR, PASP 50   Other secondary pulmonary hypertension (HCC) 04/17/2016   Prostate cancer (HCC) 2019   Sigmoid diverticulosis 06/28/2017   Noted on colonoscopy    Past Surgical History:  Procedure Laterality Date   BIOPSY  12/01/2020   Procedure: BIOPSY;  Surgeon: Jeani Hawking, MD;  Location: [facility] ENDOSCOPY;  Service: Endoscopy;;   BIOPSY  06/09/2021   Procedure: BIOPSY;  Surgeon: Jeani Hawking, MD;  Location: [facility] ENDOSCOPY;  Service: Gastroenterology;;   BIV ICD INSERTION CRT-D N/A 05/25/2016   Procedure: BiV ICD Insertion CRT-D;  Surgeon: Marinus Maw, MD;  Location: [facility] INVASIVE CV LAB;  Service: Cardiovascular;  Laterality: N/A;   CARDIAC  CATHETERIZATION N/A 03/02/2016   Procedure: Right/Left Heart Cath and Coronary Angiography;  Surgeon: Yvonne Kendall, MD;  Location: [facility] INVASIVE CV LAB;  Service: Cardiovascular;  Laterality: N/A;   CARDIOVERSION N/A 07/17/2016   Procedure: Cardioversion;  Surgeon: Marinus Maw, MD;  Location: [facility] INVASIVE CV LAB;  Service: Cardiovascular;  Laterality: N/A;   COLONOSCOPY WITH PROPOFOL N/A 06/28/2017   Procedure: COLONOSCOPY WITH PROPOFOL;  Surgeon: Jeani Hawking, MD;  Location: [facility] ENDOSCOPY;  Service: Endoscopy;  Laterality: N/A;   colonscopy  2009   ESOPHAGOGASTRODUODENOSCOPY N/A 12/01/2020   Procedure: ESOPHAGOGASTRODUODENOSCOPY (EGD);  Surgeon: Jeani Hawking, MD;  Location: [facility] ENDOSCOPY;  Service: Endoscopy;  Laterality: N/A;  IDA/guaiac positive stools   ESOPHAGOGASTRODUODENOSCOPY (EGD) WITH PROPOFOL N/A 12/16/2020   Procedure: ESOPHAGOGASTRODUODENOSCOPY (EGD) WITH PROPOFOL;  Surgeon: Jeani Hawking, MD;  Location: [facility] ENDOSCOPY;  Service: Endoscopy;  Laterality: N/A;   ESOPHAGOGASTRODUODENOSCOPY (EGD) WITH PROPOFOL N/A 06/09/2021   Procedure: ESOPHAGOGASTRODUODENOSCOPY (EGD) WITH PROPOFOL;  Surgeon: Jeani Hawking, MD;  Location: [facility] ENDOSCOPY;  Service: Gastroenterology;  Laterality: N/A;   GOLD SEED IMPLANT N/A 12/24/2017   Procedure: GOLD SEED IMPLANT, TRANSERINEAL;  Surgeon: Jerilee Field, MD;  Location: [facility] ORS;  Service: Urology;  Laterality: N/A;   INSERT / REPLACE / REMOVE PACEMAKER     LEAD REVISION  10/10/2018   LEAD REVISION/REPAIR N/A 10/10/2018   Procedure: LEAD REVISION/REPAIR;  Surgeon: Marinus Maw, MD;  Location: [facility] INVASIVE CV LAB;  Service: Cardiovascular;  Laterality: N/A;   POLYPECTOMY  06/28/2017   Procedure: POLYPECTOMY;  Surgeon: Jeani Hawking, MD;  Location: [facility] ENDOSCOPY;  Service: Endoscopy;;  ascending and descending colon polyp   PORTACATH PLACEMENT Right 12/23/2020   Procedure: INSERTION PORT-A-CATH;  Surgeon: Fritzi Mandes, MD;  Location: [facility] OR;  Service:  General;  Laterality: Right;   PROSTATE BIOPSY  02/20/2017   SPACE OAR INSTILLATION N/A 12/24/2017   Procedure: SPACE OAR INSTILLATION;  Surgeon: Jerilee Field, MD;  Location: [facility] ORS;  Service: Urology;  Laterality: N/A;   TOTAL KNEE ARTHROPLASTY Right 08/16/2017   Procedure: RIGHT TOTAL KNEE ARTHROPLASTY;  Surgeon: Frederico Hamman, MD;  Location: [facility] OR;  Service: Orthopedics;  Laterality: Right;   UPPER ESOPHAGEAL ENDOSCOPIC ULTRASOUND (EUS) N/A 12/16/2020   Procedure: UPPER ESOPHAGEAL ENDOSCOPIC ULTRASOUND (EUS);  Surgeon: Jeani Hawking, MD;  Location: [facility] ENDOSCOPY;  Service: Endoscopy;  Laterality: N/A;    Family History  Problem Relation Age of Onset   Hypertension Mother    Heart disease Mother    Diabetes Mother    Diabetes Father    Hypertension Father    Cancer Father        lung cancer   Healthy Sister    Heart attack Brother    Heart disease Brother 29       + tobacco   Healthy Brother     Social History:  reports that he has never smoked. He has never used smokeless tobacco. He reports that he does not drink alcohol and does not use drugs.  Allergies:  Allergies  Allergen Reactions   Aspirin Anaphylaxis and Hives   Sulfa Antibiotics Anaphylaxis, Hives, Swelling and Other (See Comments)    Swollen lips    Medications: Scheduled: Continuous:  sodium chloride     lactated ringers 1,000 mL (06/29/22 0653)    Results for orders placed or performed during the hospital encounter of 06/29/22 (from the past 24 hour(s))  Glucose, capillary     Status: Abnormal   Collection Time: 06/29/22  6:50 AM  Result Value Ref Range   Glucose-Capillary 110 (H) 70 - 99 mg/dL     No results found.  ROS:  As stated above in the HPI otherwise negative.  Blood pressure 113/86, pulse 64, temperature 97.6 F (36.4 C), temperature source Tympanic, resp. rate (!) 22, height 5\' 6"  (1.676 m), weight 82.1 kg, SpO2 100 %.    PE: Gen: NAD, Alert and Oriented HEENT:  [place]/AT,  EOMI Neck: Supple, no LAD Lungs: CTA Bilaterally CV: RRR without M/G/R ABD: Soft, NTND, +BS Ext: No C/C/E  Assessment/Plan: 1) Personal history of gastric cancer - EGD surveillance.  Chassidy Layson D 06/29/2022, 7:26 AM

## 2022-06-29 NOTE — Anesthesia Postprocedure Evaluation (Signed)
Anesthesia Post Note  Patient: WINTER BICKSLER  Procedure(s) Performed: ESOPHAGOGASTRODUODENOSCOPY (EGD) WITH PROPOFOL BIOPSY     Patient location during evaluation: PACU Anesthesia Type: MAC Level of consciousness: awake and alert Pain management: pain level controlled Vital Signs Assessment: post-procedure vital signs reviewed and stable Respiratory status: spontaneous breathing, nonlabored ventilation, respiratory function stable and patient connected to nasal cannula oxygen Cardiovascular status: stable and blood pressure returned to baseline Postop Assessment: no apparent nausea or vomiting Anesthetic complications: no   No notable events documented.  Last Vitals:  Vitals:   06/29/22 0850 06/29/22 0855  BP: 102/68   Pulse: 61 64  Resp: (!) 23 (!) 25  Temp:    SpO2: 96% 98%    Last Pain:  Vitals:   06/29/22 0855  TempSrc:   PainSc: 0-No pain                 Nelle Don Tiffay Pinette

## 2022-06-29 NOTE — Discharge Instructions (Signed)

## 2022-06-29 NOTE — Anesthesia Preprocedure Evaluation (Addendum)
Anesthesia Evaluation  Patient identified by MRN, date of birth, ID band Patient awake    Reviewed: Allergy & Precautions, NPO status , Patient's Chart, lab work & pertinent test results  Airway Mallampati: II  TM Distance: >3 FB Neck ROM: Full    Dental no notable dental hx.    Pulmonary    Pulmonary exam normal        Cardiovascular hypertension, Pt. on medications and Pt. on home beta blockers pulmonary hypertension+ CAD and +CHF  + dysrhythmias + Cardiac Defibrillator  Rhythm:Regular Rate:Normal  ECHO  1. Left ventricular ejection fraction, by estimation, is 20 to 25%. The  left ventricle has severely decreased function. The left ventricle  demonstrates global hypokinesis. The left ventricular internal cavity size  was moderately dilated. Left  ventricular diastolic parameters are consistent with Grade III diastolic  dysfunction (restrictive).   2. Right ventricular systolic function is mildly reduced. The right  ventricular size is normal. There is moderately elevated pulmonary artery  systolic pressure. The estimated right ventricular systolic pressure is  45.5 mmHg.   3. Left atrial size was severely dilated.   4. The mitral valve is abnormal. Moderate to severe mitral valve  regurgitation, likely functional mitral regurgitation with dilated  annulus. No evidence of mitral stenosis.   5. Tricuspid valve regurgitation is mild to moderate.   6. The aortic valve is tricuspid. Aortic valve regurgitation is not  visualized. No aortic stenosis is present.   7. The inferior vena cava is dilated in size with >50% respiratory  variability, suggesting right atrial pressure of 8 mmHg.      Neuro/Psych negative neurological ROS  negative psych ROS   GI/Hepatic Neg liver ROS,,,Gastric Ca   Endo/Other  diabetes, Type 2, Oral Hypoglycemic Agents    Renal/GU   negative genitourinary   Musculoskeletal negative  musculoskeletal ROS (+)    Abdominal Normal abdominal exam  (+)   Peds  Hematology  (+) Blood dyscrasia, anemia   Anesthesia Other Findings   Reproductive/Obstetrics                             Anesthesia Physical Anesthesia Plan  ASA: 4  Anesthesia Plan: MAC   Post-op Pain Management:    Induction: Intravenous  PONV Risk Score and Plan: 1 and Propofol infusion and Treatment may vary due to age or medical condition  Airway Management Planned: Natural Airway and Nasal Cannula  Additional Equipment: None  Intra-op Plan:   Post-operative Plan:   Informed Consent: I have reviewed the patients History and Physical, chart, labs and discussed the procedure including the risks, benefits and alternatives for the proposed anesthesia with the patient or authorized representative who has indicated his/her understanding and acceptance.     Dental advisory given  Plan Discussed with: CRNA  Anesthesia Plan Comments:        Anesthesia Quick Evaluation

## 2022-06-29 NOTE — Transfer of Care (Signed)
Immediate Anesthesia Transfer of Care Note  Patient: Jon Williams  Procedure(s) Performed: ESOPHAGOGASTRODUODENOSCOPY (EGD) WITH PROPOFOL BIOPSY  Patient Location: PACU  Anesthesia Type:MAC  Level of Consciousness: awake and alert   Airway & Oxygen Therapy: Patient Spontanous Breathing and Patient connected to face mask oxygen  Post-op Assessment: Report given to RN and Post -op Vital signs reviewed and stable  Post vital signs: Reviewed and stable  Last Vitals:  Vitals Value Taken Time  BP 105/71 06/29/22 0830  Temp    Pulse 66 06/29/22 0830  Resp 0 06/29/22 0831  SpO2 99 % 06/29/22 0830  Vitals shown include unvalidated device data.  Last Pain:  Vitals:   06/29/22 0830  TempSrc:   PainSc: 0-No pain         Complications: No notable events documented.

## 2022-07-02 ENCOUNTER — Encounter (HOSPITAL_COMMUNITY): Payer: Self-pay | Admitting: Gastroenterology

## 2022-07-03 LAB — SURGICAL PATHOLOGY

## 2022-07-04 DIAGNOSIS — E7800 Pure hypercholesterolemia, unspecified: Secondary | ICD-10-CM | POA: Diagnosis not present

## 2022-07-04 DIAGNOSIS — N400 Enlarged prostate without lower urinary tract symptoms: Secondary | ICD-10-CM | POA: Diagnosis not present

## 2022-07-04 DIAGNOSIS — I4819 Other persistent atrial fibrillation: Secondary | ICD-10-CM | POA: Diagnosis not present

## 2022-07-04 DIAGNOSIS — E1169 Type 2 diabetes mellitus with other specified complication: Secondary | ICD-10-CM | POA: Diagnosis not present

## 2022-07-04 DIAGNOSIS — G8929 Other chronic pain: Secondary | ICD-10-CM | POA: Diagnosis not present

## 2022-07-04 DIAGNOSIS — I5043 Acute on chronic combined systolic (congestive) and diastolic (congestive) heart failure: Secondary | ICD-10-CM | POA: Diagnosis not present

## 2022-07-11 ENCOUNTER — Other Ambulatory Visit: Payer: Self-pay

## 2022-07-11 ENCOUNTER — Ambulatory Visit (INDEPENDENT_AMBULATORY_CARE_PROVIDER_SITE_OTHER): Payer: Medicare HMO

## 2022-07-11 DIAGNOSIS — C169 Malignant neoplasm of stomach, unspecified: Secondary | ICD-10-CM

## 2022-07-11 DIAGNOSIS — I428 Other cardiomyopathies: Secondary | ICD-10-CM | POA: Diagnosis not present

## 2022-07-11 DIAGNOSIS — C61 Malignant neoplasm of prostate: Secondary | ICD-10-CM

## 2022-07-11 LAB — CUP PACEART REMOTE DEVICE CHECK
Battery Remaining Longevity: 26 mo
Battery Remaining Percentage: 27 %
Battery Voltage: 2.87 V
Brady Statistic AP VP Percent: 0 %
Brady Statistic AP VS Percent: 0 %
Brady Statistic AS VP Percent: 100 %
Brady Statistic AS VS Percent: 0 %
Brady Statistic RA Percent Paced: 1 %
Brady Statistic RV Percent Paced: 96 %
Date Time Interrogation Session: 20240529033243
HighPow Impedance: 56 Ohm
HighPow Impedance: 56 Ohm
Implantable Lead Connection Status: 753985
Implantable Lead Connection Status: 753985
Implantable Lead Implant Date: 20180413
Implantable Lead Implant Date: 20200828
Implantable Lead Location: 753859
Implantable Lead Location: 753860
Implantable Lead Model: 7122
Implantable Pulse Generator Implant Date: 20180413
Lead Channel Impedance Value: 400 Ohm
Lead Channel Impedance Value: 760 Ohm
Lead Channel Pacing Threshold Amplitude: 0.75 V
Lead Channel Pacing Threshold Amplitude: 1 V
Lead Channel Pacing Threshold Pulse Width: 0.5 ms
Lead Channel Pacing Threshold Pulse Width: 0.5 ms
Lead Channel Sensing Intrinsic Amplitude: 2.3 mV
Lead Channel Sensing Intrinsic Amplitude: 7.3 mV
Lead Channel Setting Pacing Amplitude: 2 V
Lead Channel Setting Pacing Amplitude: 2.5 V
Lead Channel Setting Pacing Pulse Width: 0.5 ms
Lead Channel Setting Sensing Sensitivity: 0.5 mV
Pulse Gen Serial Number: 7398151
Zone Setting Status: 755011

## 2022-07-12 ENCOUNTER — Encounter: Payer: Self-pay | Admitting: Hematology

## 2022-07-12 ENCOUNTER — Inpatient Hospital Stay: Payer: Medicare HMO | Admitting: Hematology

## 2022-07-12 ENCOUNTER — Inpatient Hospital Stay: Payer: Medicare HMO | Attending: Nurse Practitioner

## 2022-07-12 VITALS — BP 97/68 | HR 67 | Temp 97.9°F | Resp 18 | Ht 66.0 in | Wt 182.3 lb

## 2022-07-12 DIAGNOSIS — Z8546 Personal history of malignant neoplasm of prostate: Secondary | ICD-10-CM | POA: Diagnosis not present

## 2022-07-12 DIAGNOSIS — G620 Drug-induced polyneuropathy: Secondary | ICD-10-CM | POA: Diagnosis not present

## 2022-07-12 DIAGNOSIS — C163 Malignant neoplasm of pyloric antrum: Secondary | ICD-10-CM | POA: Diagnosis not present

## 2022-07-12 DIAGNOSIS — D509 Iron deficiency anemia, unspecified: Secondary | ICD-10-CM | POA: Diagnosis not present

## 2022-07-12 DIAGNOSIS — Z79899 Other long term (current) drug therapy: Secondary | ICD-10-CM | POA: Insufficient documentation

## 2022-07-12 DIAGNOSIS — T451X5A Adverse effect of antineoplastic and immunosuppressive drugs, initial encounter: Secondary | ICD-10-CM | POA: Diagnosis not present

## 2022-07-12 DIAGNOSIS — Z95828 Presence of other vascular implants and grafts: Secondary | ICD-10-CM

## 2022-07-12 DIAGNOSIS — C169 Malignant neoplasm of stomach, unspecified: Secondary | ICD-10-CM

## 2022-07-12 DIAGNOSIS — C61 Malignant neoplasm of prostate: Secondary | ICD-10-CM

## 2022-07-12 DIAGNOSIS — Z08 Encounter for follow-up examination after completed treatment for malignant neoplasm: Secondary | ICD-10-CM | POA: Insufficient documentation

## 2022-07-12 DIAGNOSIS — Z85028 Personal history of other malignant neoplasm of stomach: Secondary | ICD-10-CM

## 2022-07-12 DIAGNOSIS — D500 Iron deficiency anemia secondary to blood loss (chronic): Secondary | ICD-10-CM

## 2022-07-12 LAB — CMP (CANCER CENTER ONLY)
ALT: 45 U/L — ABNORMAL HIGH (ref 0–44)
AST: 30 U/L (ref 15–41)
Albumin: 4.6 g/dL (ref 3.5–5.0)
Alkaline Phosphatase: 59 U/L (ref 38–126)
Anion gap: 6 (ref 5–15)
BUN: 25 mg/dL — ABNORMAL HIGH (ref 8–23)
CO2: 26 mmol/L (ref 22–32)
Calcium: 9.4 mg/dL (ref 8.9–10.3)
Chloride: 108 mmol/L (ref 98–111)
Creatinine: 1.23 mg/dL (ref 0.61–1.24)
GFR, Estimated: >60 mL/min (ref >60)
Glucose, Bld: 125 mg/dL — ABNORMAL HIGH (ref 70–99)
Potassium: 4.1 mmol/L (ref 3.5–5.1)
Sodium: 140 mmol/L (ref 135–145)
Total Bilirubin: 0.9 mg/dL (ref 0.3–1.2)
Total Protein: 7.5 g/dL (ref 6.5–8.1)

## 2022-07-12 LAB — CBC WITH DIFFERENTIAL (CANCER CENTER ONLY)
Abs Immature Granulocytes: 0.01 10*3/uL (ref 0.00–0.07)
Basophils Absolute: 0 10*3/uL (ref 0.0–0.1)
Basophils Relative: 0 %
Eosinophils Absolute: 0.1 10*3/uL (ref 0.0–0.5)
Eosinophils Relative: 1 %
HCT: 41.5 % (ref 39.0–52.0)
Hemoglobin: 14 g/dL (ref 13.0–17.0)
Immature Granulocytes: 0 %
Lymphocytes Relative: 19 %
Lymphs Abs: 1 10*3/uL (ref 0.7–4.0)
MCH: 33.1 pg (ref 26.0–34.0)
MCHC: 33.7 g/dL (ref 30.0–36.0)
MCV: 98.1 fL (ref 80.0–100.0)
Monocytes Absolute: 0.5 10*3/uL (ref 0.1–1.0)
Monocytes Relative: 10 %
Neutro Abs: 3.7 10*3/uL (ref 1.7–7.7)
Neutrophils Relative %: 70 %
Platelet Count: 182 10*3/uL (ref 150–400)
RBC: 4.23 MIL/uL (ref 4.22–5.81)
RDW: 13.1 % (ref 11.5–15.5)
WBC Count: 5.2 10*3/uL (ref 4.0–10.5)
nRBC: 0 % (ref 0.0–0.2)

## 2022-07-12 LAB — FERRITIN: Ferritin: 33 ng/mL (ref 24–336)

## 2022-07-12 MED ORDER — SODIUM CHLORIDE 0.9% FLUSH
10.0000 mL | Freq: Once | INTRAVENOUS | Status: AC
  Administered 2022-07-12: 10 mL

## 2022-07-12 MED ORDER — HEPARIN SOD (PORK) LOCK FLUSH 100 UNIT/ML IV SOLN
500.0000 [IU] | Freq: Once | INTRAVENOUS | Status: AC
  Administered 2022-07-12: 500 [IU]

## 2022-07-12 NOTE — Assessment & Plan Note (Addendum)
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  S/p FLOT4 01/02/2021 - 03/22/2021 -Posttreatment EUS 05/2021 and 06/29/2022 by Dr. Elnoria Howard showed a scar and residual erythema in stomach, biopsy negative for residual malignancy.  -Jon Williams is clinically doing well on surveillance. Last CT 03/20/22 which shows no evidence of recurrent or metastatic disease, and stable bilateral adrenal adenomas -will repeat CT in 2-3 months

## 2022-07-12 NOTE — Progress Notes (Signed)
[facility]   Telephone:(336) [phone] Fax:(336) [phone]   Clinic Follow up Note   Patient Care Team: Renford Dills, MD as PCP - General (Internal Medicine) [name], Deloris Ping, MD as PCP - Cardiology (Cardiology) Marinus Maw, MD as PCP - Electrophysiology (Cardiology) Fritzi Mandes, MD as Consulting Physician (General Surgery) Malachy Mood, MD as Consulting Physician (Hematology) Jeani Hawking, MD as Consulting Physician (Gastroenterology)  Date of Service:  07/12/2022  CHIEF COMPLAINT: f/u of  gastric cancer    CURRENT THERAPY:  Surveillance  ASSESSMENT:  Jon Williams is a 72 y.o. male with   Gastric cancer (HCC) cT3N0M0, stage II -Diagnosed 11/2020, path showed loss of MLH1 and PMS2 which predicts good response to immunotherapy  -Deemed a high risk surgical candidate.  S/p FLOT4 01/02/2021 - 03/22/2021 -Posttreatment EUS 05/2021 and 06/29/2022 by Dr. Elnoria Williams showed a scar and residual erythema in stomach, biopsy negative for residual malignancy. I reviewed with pt  -Jon Williams is clinically doing well on surveillance. Last CT 03/20/22 which shows no evidence of recurrent or metastatic disease, and stable bilateral adrenal adenomas -will repeat CT in 2-3 months  -He is clinically doing very well, exam was unremarkable, lab reviewed, no clinical concern for cancer recurrence. -We discussed that he has achieved complete response to chemotherapy, but is still at risk for recurrence, will monitor closely -Plan to remove his port in 12 to 18 months if no recurrence.  Peripheral neuropathy -Secondary to chemotherapy, he is on gabapentin, overall stable, -We discussed fall precaution  H/o Prostate Cancer  -diagnosed with Gleason 4+4 prostate cancer in early 2019. He was treated with long-term ADT in combination with 8 weeks of IMRT.   Diabetes, atrial fibrillation, severe cardiomyopathy with EF 20 to 25%, status post ICD placement -Follow-up with PCP and  cardiology -no signs of fluid overload thus far, continue monitoring closely    PLAN: -lab reviewed -CMP -pending -Will repeat CT in 2-3 months - I order CT CAP 09/2022 before next visit  -lab/flush and f/u in 3 months   SUMMARY OF ONCOLOGIC HISTORY: Oncology History Overview Note  Cancer Staging Gastric cancer [facility]) Staging form: Stomach, AJCC 8th Edition - Clinical stage from 12/01/2020: Stage IIB (cT3, cN0, cM0) - Signed by Malachy Mood, MD on 12/20/2020 Stage prefix: Initial diagnosis Total positive nodes: 0  Malignant neoplasm of prostate (HCC) Staging form: Prostate, AJCC 8th Edition - Clinical: Stage IIC (cT2b, cN0, cM0, PSA: 6.7, Grade Group: 4) - Unsigned Prostate specific antigen (PSA) range: Less than 10 Gleason score: 8 Histologic grading system: 5 grade system    Gastric cancer (HCC)  12/01/2020 Procedure   Upper Endoscopy, Dr. Elnoria Williams  Impression: - Normal esophagus. 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 Biopsied. - Normal examined duodenum.   12/01/2020 Pathology Results   FINAL MICROSCOPIC DIAGNOSIS:   A. INCISURA, BIOPSY:  - Adenocarcinoma, moderate to poorly differentiated arising in a  background of chronic gastritis with intestinal metaplasia.    12/01/2020 Imaging   CT CAP  IMPRESSION: Focal wall thickening along the posterior aspect of the gastric antrum, likely corresponding to the patient's newly diagnosed gastric cancer.   No findings suspicious for metastatic disease.   Mild multifocal pneumonia, lower lobe predominant, likely on the basis of aspiration. Trace right pleural effusion.   Fiducial markers along the prostate in this patient with known prostate cancer.   12/01/2020 Cancer Staging   Staging form: Stomach, AJCC 8th Edition - Clinical stage from 12/01/2020: Stage IIB (  cT3, cN0, cM0) - Signed by Malachy Mood, MD on 12/20/2020 Stage prefix: Initial diagnosis Total positive nodes: 0   12/08/2020 Initial Diagnosis   Gastric  cancer (HCC)   12/16/2020 Procedure   EUS  - Wall thickening was seen in the lesser curve of the stomach and in the antrum of the stomach. The thickening appeared to be primarily within the serosa (Layer 5). T3 N0 Mx. - There was no sign of significant pathology in the entire pancreas. - There was no sign of significant pathology in the common bile duct and in the gallbladder. - There was no evidence of significant pathology in the left lobe of the liver. - Endosonographic images of the left adrenal gland were unremarkable. - The celiac trunk and superior mesenteric artery were endosonographically normal. - The mediastinum was unremarkable endosonographically. - No specimens collected.   01/02/2021 - 03/23/2021 Chemotherapy   Patient is on Treatment Plan : GASTROESOPHAGEAL FLOT q14d X 4 cycles     04/01/2021 Imaging   EXAM: CT ABDOMEN AND PELVIS WITHOUT CONTRAST  IMPRESSION: 1. No acute findings in the abdomen or pelvis. Specifically, no evidence for metastatic disease in the abdomen or pelvis. 2. Stable 16 mm left adrenal adenoma. 3. Small fat containing hernias in the right groin and umbilical region. 4. Bilateral pars interarticularis defects at L4 with 10 mm anterolisthesis of L4 on 5, stable. 5. Aortic Atherosclerosis (ICD10-I70.0).   04/13/2021 Imaging   EXAM: CT CHEST WITHOUT CONTRAST  IMPRESSION: 1. No evidence of metastatic disease in the chest. 2. Enlargement of the main pulmonary artery, as can be seen in pulmonary hypertension. 3. Coronary artery disease.   Aortic Atherosclerosis (ICD10-I70.0).   06/09/2021 Procedure   Upper GI Endoscopy, Dr. Elnoria Williams  Findings: -The esophagus was normal. -A medium healed ulcer was found on the lesser curvature of the stomach. Biopsies were taken with a cold forceps for histology. -The examined duodenum was normal. -Compared to the prior EGD there is a marked improvement in his gastric cancer. There was only evidence of erythema and  a central scar at the incisura. Cold biopsies of the area were obtained, but there was no gross evidence of malignancy.  Impression: - Normal esophagus. - Scar in the lesser curvature of the stomach. Biopsied. - Normal examined duodenum.   06/09/2021 Pathology Results   FINAL MICROSCOPIC DIAGNOSIS:   A. STOMACH, BIOPSY:  - Gastric mucosa with acute and chronic inflammation.  - No dysplasia or malignancy identified.    06/16/2021 - 06/16/2021 Chemotherapy   Patient is on Treatment Plan : GASTROESOPHAGEAL Pembrolizumab (200) q21d        INTERVAL HISTORY:  Jon Williams is here for a follow up of  gastric cancer. He was last seen by NP Lacie on 03/22/2022. He presents to the clinic accompanied by wife. Pt state that he is still dealing with the neuropathy and he rates the pain at about 3. He take Gabapentin for the pain twice a day.He state that it is tolerable. Pt state that his activity has increase he is able to house work and take care of himself.        All other systems were reviewed with the patient and are negative.  MEDICAL HISTORY:  Past Medical History:  Diagnosis Date   AICD (automatic cardioverter/defibrillator) present 05/25/2016   biv icd   Bunion, right    Chronic combined systolic and diastolic CHF (congestive heart failure) (HCC)    Echo 1/18: Mild conc LVH, EF 15-20, severe  diff HK, inf and inf-septal AK, Gr 3 DD, mild to mod Jon, severe LAE, mod reduced RVSF, mod RAE, mild TR, PASP 50   Coronary artery disease involving native coronary artery without angina pectoris 04/17/2016   LHC 1/18: pLCx 30, mLCx 20, mRCA 40, dRCA 20, LVEDP 23, mean RA 8, PA 42/20, PCWP 17   Diabetes mellitus without complication (HCC)    Gastric cancer (HCC)    History of atrial fibrillation    History of atrial flutter    History of cardiomegaly 06/07/2016   Noted on CXR   History of colon polyps 06/28/2017   Noted on colonoscopy   Hypertension    LBBB (left bundle branch block)     Nausea vomiting and diarrhea 04/04/2021   NICM (nonischemic cardiomyopathy) (HCC)    Echo 1/18:  Mild conc LVH, EF 15-20, severe diff HK, inf and inf-septal AK, Gr 3 DD, mild to mod Jon, severe LAE, mod reduced RVSF, mod RAE, mild TR, PASP 50   Other secondary pulmonary hypertension (HCC) 04/17/2016   Prostate cancer (HCC) 2019   Sigmoid diverticulosis 06/28/2017   Noted on colonoscopy    SURGICAL HISTORY: Past Surgical History:  Procedure Laterality Date   BIOPSY  12/01/2020   Procedure: BIOPSY;  Surgeon: Jeani Hawking, MD;  Location: [facility] ENDOSCOPY;  Service: Endoscopy;;   BIOPSY  06/09/2021   Procedure: BIOPSY;  Surgeon: Jeani Hawking, MD;  Location: [facility] ENDOSCOPY;  Service: Gastroenterology;;   BIOPSY  06/29/2022   Procedure: BIOPSY;  Surgeon: Jeani Hawking, MD;  Location: [facility] ENDOSCOPY;  Service: Gastroenterology;;   BIV ICD INSERTION CRT-D N/A 05/25/2016   Procedure: BiV ICD Insertion CRT-D;  Surgeon: Marinus Maw, MD;  Location: [facility] INVASIVE CV LAB;  Service: Cardiovascular;  Laterality: N/A;   CARDIAC CATHETERIZATION N/A 03/02/2016   Procedure: Right/Left Heart Cath and Coronary Angiography;  Surgeon: Yvonne Kendall, MD;  Location: [facility] INVASIVE CV LAB;  Service: Cardiovascular;  Laterality: N/A;   CARDIOVERSION N/A 07/17/2016   Procedure: Cardioversion;  Surgeon: Marinus Maw, MD;  Location: [facility] INVASIVE CV LAB;  Service: Cardiovascular;  Laterality: N/A;   COLONOSCOPY WITH PROPOFOL N/A 06/28/2017   Procedure: COLONOSCOPY WITH PROPOFOL;  Surgeon: Jeani Hawking, MD;  Location: [facility] ENDOSCOPY;  Service: Endoscopy;  Laterality: N/A;   colonscopy  2009   ESOPHAGOGASTRODUODENOSCOPY N/A 12/01/2020   Procedure: ESOPHAGOGASTRODUODENOSCOPY (EGD);  Surgeon: Jeani Hawking, MD;  Location: [facility] ENDOSCOPY;  Service: Endoscopy;  Laterality: N/A;  IDA/guaiac positive stools   ESOPHAGOGASTRODUODENOSCOPY (EGD) WITH PROPOFOL N/A 12/16/2020   Procedure: ESOPHAGOGASTRODUODENOSCOPY (EGD) WITH PROPOFOL;   Surgeon: Jeani Hawking, MD;  Location: [facility] ENDOSCOPY;  Service: Endoscopy;  Laterality: N/A;   ESOPHAGOGASTRODUODENOSCOPY (EGD) WITH PROPOFOL N/A 06/09/2021   Procedure: ESOPHAGOGASTRODUODENOSCOPY (EGD) WITH PROPOFOL;  Surgeon: Jeani Hawking, MD;  Location: [facility] ENDOSCOPY;  Service: Gastroenterology;  Laterality: N/A;   ESOPHAGOGASTRODUODENOSCOPY (EGD) WITH PROPOFOL N/A 06/29/2022   Procedure: ESOPHAGOGASTRODUODENOSCOPY (EGD) WITH PROPOFOL;  Surgeon: Jeani Hawking, MD;  Location: [facility] ENDOSCOPY;  Service: Gastroenterology;  Laterality: N/A;   GOLD SEED IMPLANT N/A 12/24/2017   Procedure: GOLD SEED IMPLANT, TRANSERINEAL;  Surgeon: Jerilee Field, MD;  Location: [facility] ORS;  Service: Urology;  Laterality: N/A;   INSERT / REPLACE / REMOVE PACEMAKER     LEAD REVISION  10/10/2018   LEAD REVISION/REPAIR N/A 10/10/2018   Procedure: LEAD REVISION/REPAIR;  Surgeon: Marinus Maw, MD;  Location: [facility] INVASIVE CV LAB;  Service: Cardiovascular;  Laterality: N/A;   POLYPECTOMY  06/28/2017   Procedure: POLYPECTOMY;  Surgeon:  Jeani Hawking, MD;  Location: [facility] ENDOSCOPY;  Service: Endoscopy;;  ascending and descending colon polyp   PORTACATH PLACEMENT Right 12/23/2020   Procedure: INSERTION PORT-A-CATH;  Surgeon: Fritzi Mandes, MD;  Location: [facility] OR;  Service: General;  Laterality: Right;   PROSTATE BIOPSY  02/20/2017   SPACE OAR INSTILLATION N/A 12/24/2017   Procedure: SPACE OAR INSTILLATION;  Surgeon: Jerilee Field, MD;  Location: [facility] ORS;  Service: Urology;  Laterality: N/A;   TOTAL KNEE ARTHROPLASTY Right 08/16/2017   Procedure: RIGHT TOTAL KNEE ARTHROPLASTY;  Surgeon: Frederico Hamman, MD;  Location: [facility] OR;  Service: Orthopedics;  Laterality: Right;   UPPER ESOPHAGEAL ENDOSCOPIC ULTRASOUND (EUS) N/A 12/16/2020   Procedure: UPPER ESOPHAGEAL ENDOSCOPIC ULTRASOUND (EUS);  Surgeon: Jeani Hawking, MD;  Location: [facility] ENDOSCOPY;  Service: Endoscopy;  Laterality: N/A;    I have reviewed the social history and family  history with the patient and they are unchanged from previous note.  ALLERGIES:  is allergic to aspirin and sulfa antibiotics.  MEDICATIONS:  Current Outpatient Medications  Medication Sig Dispense Refill   atorvastatin (LIPITOR) 40 MG tablet TAKE 1 TABLET(40 MG) BY MOUTH DAILY 90 tablet 3   Biotin 1000 MCG tablet Take 1,000 mcg by mouth daily with breakfast.     cholecalciferol (VITAMIN D3) 25 MCG (1000 UT) tablet Take 1,000 Units by mouth daily with breakfast.     empagliflozin (JARDIANCE) 10 MG TABS tablet Take 1 tablet (10 mg total) by mouth daily. 90 tablet 3   furosemide (LASIX) 40 MG tablet Take 1 tablet (40 mg total) by mouth daily. 30 tablet 2   gabapentin (NEURONTIN) 100 MG capsule TAKE 1 CAPSULE(100 MG) BY MOUTH TWICE DAILY 180 capsule 0   loratadine (CLARITIN) 10 MG tablet Take 10 mg by mouth daily.     metFORMIN (GLUCOPHAGE-XR) 500 MG 24 hr tablet Take 500 mg by mouth every evening.     metoprolol succinate (TOPROL-XL) 25 MG 24 hr tablet TAKE 1 TABLET(25 MG) BY MOUTH DAILY 90 tablet 3   multivitamin (ONE-A-DAY MEN'S) TABS tablet Take 1 tablet by mouth daily with breakfast.     Naftifine HCl (NAFTIN) 2 % GEL Apply 1 application  topically every Monday, Wednesday, and Friday.     pantoprazole (PROTONIX) 40 MG tablet Take 1 tablet (40 mg total) by mouth 2 (two) times daily. 60 tablet 0   potassium chloride (KLOR-CON) 10 MEQ tablet TAKE 1 TABLET BY MOUTH TWICE DAILY 180 tablet 2   rivaroxaban (XARELTO) 20 MG TABS tablet TAKE 1 TABLET BY MOUTH EVERY DAY WITH SUPPER 90 tablet 2   sacubitril-valsartan (ENTRESTO) 24-26 MG Take 1 tablet by mouth 2 (two) times daily. 180 tablet 3   sotalol (BETAPACE) 80 MG tablet TAKE 1 AND 1/2 TABLETS(120 MG) BY MOUTH IN THE MORNING AND AT BEDTIME 270 tablet 1   spironolactone (ALDACTONE) 25 MG tablet Take 1 tablet (25 mg total) by mouth daily. 60 tablet 2   Tavaborole 5 % SOLN Apply 1 application  topically daily. Nail on left hand     No current  facility-administered medications for this visit.    PHYSICAL EXAMINATION: ECOG PERFORMANCE STATUS: 1 - Symptomatic but completely ambulatory  Vitals:   07/12/22 1030  BP: 97/68  Pulse: 67  Resp: 18  Temp: 97.9 F (36.6 C)  SpO2: 100%   Wt Readings from Last 3 Encounters:  07/12/22 182 lb 4.8 oz (82.7 kg)  06/29/22 181 lb (82.1 kg)  03/13/22 181 lb (82.1 kg)  GENERAL:alert, no distress and comfortable SKIN: skin color normal, no rashes or significant lesions EYES: normal, Conjunctiva are pink and non-injected, sclera clear  NEURO: alert & oriented x 3 with fluent speech  LABORATORY DATA:  I have reviewed the data as listed    Latest Ref Rng & Units 07/12/2022   10:12 AM 03/22/2022   10:26 AM 12/26/2021   12:49 AM  CBC  WBC 4.0 - 10.5 K/uL 5.2  5.2  5.9   Hemoglobin 13.0 - 17.0 g/dL 27.0  35.0  09.3   Hematocrit 39.0 - 52.0 % 41.5  41.0  38.6   Platelets 150 - 400 K/uL 182  171  168         Latest Ref Rng & Units 07/12/2022   10:12 AM 03/22/2022   10:26 AM 03/20/2022   10:33 AM  CMP  Glucose 70 - 99 mg/dL 818  91    BUN 8 - 23 mg/dL 25  15    Creatinine 2.99 - 1.24 mg/dL 3.71  6.96  7.89   Sodium 135 - 145 mmol/L 140  140    Potassium 3.5 - 5.1 mmol/L 4.1  3.9    Chloride 98 - 111 mmol/L 108  105    CO2 22 - 32 mmol/L 26  27    Calcium 8.9 - 10.3 mg/dL 9.4  9.3    Total Protein 6.5 - 8.1 g/dL 7.5  6.8    Total Bilirubin 0.3 - 1.2 mg/dL 0.9  0.9    Alkaline Phos 38 - 126 U/L 59  45    AST 15 - 41 U/L 30  18    ALT 0 - 44 U/L 45  13        RADIOGRAPHIC STUDIES: I have personally reviewed the radiological images as listed and agreed with the findings in the report. CUP PACEART REMOTE DEVICE CHECK  Result Date: 07/11/2022 Scheduled remote reviewed. Normal device function.  Permanent AF, controlled rates,  Xarelto per PA report Next remote 91 days. LA, CVRS     Orders Placed This Encounter  Procedures   CT CHEST ABDOMEN PELVIS W CONTRAST    Standing  Status:   Future    Standing Expiration Date:   07/12/2023    Order Specific Question:   If indicated for the ordered procedure, I authorize the administration of contrast media per Radiology protocol    Answer:   Yes    Order Specific Question:   Does the patient have a contrast media/X-ray dye allergy?    Answer:   No    Order Specific Question:   Preferred imaging location?    Answer:   [facility]    Order Specific Question:   Release to patient    Answer:   Immediate    Order Specific Question:   If indicated for the ordered procedure, I authorize the administration of oral contrast media per Radiology protocol    Answer:   Yes   All questions were answered. The patient knows to call the clinic with any problems, questions or concerns. No barriers to learning was detected. The total time spent in the appointment was 25 minutes.     Malachy Mood, MD 07/12/2022   Carolin Coy, CMA, am acting as scribe for Malachy Mood, MD.   I have reviewed the above documentation for accuracy and completeness, and I agree with the above.

## 2022-07-20 ENCOUNTER — Telehealth: Payer: Self-pay

## 2022-07-20 DIAGNOSIS — E1169 Type 2 diabetes mellitus with other specified complication: Secondary | ICD-10-CM | POA: Diagnosis not present

## 2022-07-20 DIAGNOSIS — D500 Iron deficiency anemia secondary to blood loss (chronic): Secondary | ICD-10-CM | POA: Diagnosis not present

## 2022-07-20 DIAGNOSIS — G8929 Other chronic pain: Secondary | ICD-10-CM | POA: Diagnosis not present

## 2022-07-20 DIAGNOSIS — E7800 Pure hypercholesterolemia, unspecified: Secondary | ICD-10-CM | POA: Diagnosis not present

## 2022-07-20 DIAGNOSIS — I5043 Acute on chronic combined systolic (congestive) and diastolic (congestive) heart failure: Secondary | ICD-10-CM | POA: Diagnosis not present

## 2022-07-20 DIAGNOSIS — N400 Enlarged prostate without lower urinary tract symptoms: Secondary | ICD-10-CM | POA: Diagnosis not present

## 2022-07-20 MED ORDER — DAPAGLIFLOZIN PROPANEDIOL 10 MG PO TABS
10.0000 mg | ORAL_TABLET | Freq: Every day | ORAL | 3 refills | Status: DC
  Filled 2022-09-21: qty 90, 90d supply, fill #0

## 2022-07-20 NOTE — Telephone Encounter (Signed)
The patient called and stated that his [place] Pepper is $500 and that he thought it was getting changed to a different medication.  The patient states he is now out of the medication.  I told the patient that I would make sure the nurse gets the message.

## 2022-07-20 NOTE — Addendum Note (Signed)
Addended by: Lars Mage on: 07/20/2022 04:23 PM   Modules accepted: Orders

## 2022-07-20 NOTE — Telephone Encounter (Signed)
[name], Deloris Ping, MD  Lars Mage, RN Caller: Unspecified (Today, 11:24 AM) I agree to change to Marcelline Deist to see if its any less expensive I dont have any alternatives otherwise.   He should be ok with out the medication if he cannot afford it PN  Called medication into the pharmacy on file. Called patient to make him aware. He understands that if it's still not affordable, that it's ultimately okay, to be off of it.

## 2022-07-20 NOTE — Telephone Encounter (Signed)
Per OV note from [name] on 03/13/22: Blood pressure and heart rate are on the low side. We will not change any of his medications at this point. I will see him again in 6 months.   Returned call to patient who is unsure, but supposes that he's hit the donut hole. He states that he is out of Jardiance and has applied for patient assistance through the company, but was denied due to his income level. He is requesting an alternative, he cannot afford the $500 copay. Advised that we can try to switch to [place] which has generic, but that since it's new, it likely will still be expensive in the donut hole. Called pt's pharmacy to be sure what caused the price increase and they confirmed he's in the donut hole. Will route to MD to authorize switch to [place] to see if by chance, the insurance will help pay more towards it/see if its affordable.

## 2022-08-02 NOTE — Progress Notes (Signed)
Remote ICD transmission.   

## 2022-08-07 ENCOUNTER — Telehealth: Payer: Self-pay | Admitting: Hematology

## 2022-08-07 NOTE — Telephone Encounter (Signed)
Patient will be out of town, need to reschedule appointment, patient is aware of rescheduled appointment time/date.

## 2022-08-10 DIAGNOSIS — B351 Tinea unguium: Secondary | ICD-10-CM | POA: Diagnosis not present

## 2022-08-24 ENCOUNTER — Ambulatory Visit: Payer: Medicare HMO | Admitting: Cardiovascular Disease

## 2022-08-27 ENCOUNTER — Other Ambulatory Visit: Payer: Self-pay | Admitting: Internal Medicine

## 2022-08-27 DIAGNOSIS — I4819 Other persistent atrial fibrillation: Secondary | ICD-10-CM

## 2022-08-28 ENCOUNTER — Other Ambulatory Visit (HOSPITAL_COMMUNITY): Payer: Self-pay

## 2022-08-28 ENCOUNTER — Ambulatory Visit: Payer: Medicare HMO | Attending: Cardiovascular Disease | Admitting: Cardiovascular Disease

## 2022-08-28 ENCOUNTER — Encounter: Payer: Self-pay | Admitting: Cardiovascular Disease

## 2022-08-28 VITALS — BP 75/60 | HR 45 | Ht 66.0 in | Wt 182.0 lb

## 2022-08-28 DIAGNOSIS — I5043 Acute on chronic combined systolic (congestive) and diastolic (congestive) heart failure: Secondary | ICD-10-CM

## 2022-08-28 DIAGNOSIS — I4819 Other persistent atrial fibrillation: Secondary | ICD-10-CM

## 2022-08-28 DIAGNOSIS — I2510 Atherosclerotic heart disease of native coronary artery without angina pectoris: Secondary | ICD-10-CM

## 2022-08-28 NOTE — Patient Instructions (Signed)
Medication Instructions:  STOP Jardiance (should be on Farxiga/Dapaglifozin) STOP Furosemide**please keep tablets in case needed in future to manage volume overload** STOP Potassium Chloride *If you need a refill on your cardiac medications before your next appointment, please call your pharmacy*   Lab Work: NONE If you have labs (blood work) drawn today and your tests are completely normal, you will receive your results only by: MyChart Message (if you have MyChart) OR A paper copy in the mail If you have any lab test that is abnormal or we need to change your treatment, we will call you to review the results.   Testing/Procedures: NONE   Follow-Up: At [facility], you and your health needs are our priority.  As part of our continuing mission to provide you with exceptional heart care, we have created designated Provider Care Teams.  These Care Teams include your primary Cardiologist (physician) and Advanced Practice Providers (APPs -  Physician Assistants and Nurse Practitioners) who all work together to provide you with the care you need, when you need it.  Your next appointment:   3 month(s)  Provider:   Eligha Bridegroom, NP

## 2022-08-28 NOTE — Progress Notes (Signed)
Cardiology Office Note:    Date:  08/28/2022   ID:  Jon Williams, DOB [birth year], MRN [id number]  PCP:  Renford Dills, MD   [facility] Providers Cardiologist:  Kristeen Miss, MD Electrophysiologist:  Lewayne Bunting, MD {   Referring MD: Renford Dills, MD   Chief Complaint  Patient presents with   Congestive Heart Failure          History of Present Illness:    Jon Williams is a 72 y.o. male with a hx of chronic combined systolic and diastolic congestive heart failure, atrial fibrillation . Was assigned to see him in 2018.  He was seen by Tereso Newcomer at that time.  He is seen numerous other cardiologist over the years but this is the first time a me actually meeting him in person.  He was recently admitted to the hospital with acute on chronic combined systolic heart failure.   Had  severe shortness of breath  He has a history of an ICD, persistent atrial fibrillation, prostate cancer, gastric cancer, hypertension, dyslipidemia, diabetes mellitus  They try to avoid salty foods.    He is on Entresto, Lasix Is feeling better now   BP is a bit low now  Has not had syncope or presyncope   Was dx with stomach cancer, Received chemo x 5 treatments due to severe complications . Scans have looked ok .  Just following for now   March 02, 2022: Jon Williams is seen today for follow-up of his congestive heart failure.  He has a history of gastric cancer.  Breathing has been ok Tries to exercise some  No CP , no syncope   August 28, 2022 Jon Williams is seen for follow up of CHF  Confusion about jardiance and farxiga I explained that farxiga is now [employer] , works out with [employer]   Has peripheral neuropathy from chemo from gastric cancer   CHF:   EF is 20-25%, grade III DD Moderate - severe MR  Mild TR   Wife thinks that her BP is lower in his left arm compared to right arm  R arm  86/60  Left arm   75/60   Past Medical History:  Diagnosis Date    AICD (automatic cardioverter/defibrillator) present 05/25/2016   biv icd   Bunion, right    Chronic combined systolic and diastolic CHF (congestive heart failure) (HCC)    Echo 1/18: Mild conc LVH, EF 15-20, severe diff HK, inf and inf-septal AK, Gr 3 DD, mild to mod MR, severe LAE, mod reduced RVSF, mod RAE, mild TR, PASP 50   Coronary artery disease involving native coronary artery without angina pectoris 04/17/2016   LHC 1/18: pLCx 30, mLCx 20, mRCA 40, dRCA 20, LVEDP 23, mean RA 8, PA 42/20, PCWP 17   Diabetes mellitus without complication (HCC)    Gastric cancer (HCC)    History of atrial fibrillation    History of atrial flutter    History of cardiomegaly 06/07/2016   Noted on CXR   History of colon polyps 06/28/2017   Noted on colonoscopy   Hypertension    LBBB (left bundle branch block)    Nausea vomiting and diarrhea 04/04/2021   NICM (nonischemic cardiomyopathy) (HCC)    Echo 1/18:  Mild conc LVH, EF 15-20, severe diff HK, inf and inf-septal AK, Gr 3 DD, mild to mod MR, severe LAE, mod reduced RVSF, mod RAE, mild TR, PASP 50   Other secondary pulmonary hypertension (HCC) 04/17/2016  Prostate cancer (HCC) 2019   Sigmoid diverticulosis 06/28/2017   Noted on colonoscopy    Past Surgical History:  Procedure Laterality Date   BIOPSY  12/01/2020   Procedure: BIOPSY;  Surgeon: Jeani Hawking, MD;  Location: [facility] ENDOSCOPY;  Service: Endoscopy;;   BIOPSY  06/09/2021   Procedure: BIOPSY;  Surgeon: Jeani Hawking, MD;  Location: [facility] ENDOSCOPY;  Service: Gastroenterology;;   BIOPSY  06/29/2022   Procedure: BIOPSY;  Surgeon: Jeani Hawking, MD;  Location: [facility] ENDOSCOPY;  Service: Gastroenterology;;   BIV ICD INSERTION CRT-D N/A 05/25/2016   Procedure: BiV ICD Insertion CRT-D;  Surgeon: Marinus Maw, MD;  Location: [facility] INVASIVE CV LAB;  Service: Cardiovascular;  Laterality: N/A;   CARDIAC CATHETERIZATION N/A 03/02/2016   Procedure: Right/Left Heart Cath and Coronary Angiography;  Surgeon:  Yvonne Kendall, MD;  Location: [facility] INVASIVE CV LAB;  Service: Cardiovascular;  Laterality: N/A;   CARDIOVERSION N/A 07/17/2016   Procedure: Cardioversion;  Surgeon: Marinus Maw, MD;  Location: [facility] INVASIVE CV LAB;  Service: Cardiovascular;  Laterality: N/A;   COLONOSCOPY WITH PROPOFOL N/A 06/28/2017   Procedure: COLONOSCOPY WITH PROPOFOL;  Surgeon: Jeani Hawking, MD;  Location: [facility] ENDOSCOPY;  Service: Endoscopy;  Laterality: N/A;   colonscopy  2009   ESOPHAGOGASTRODUODENOSCOPY N/A 12/01/2020   Procedure: ESOPHAGOGASTRODUODENOSCOPY (EGD);  Surgeon: Jeani Hawking, MD;  Location: [facility] ENDOSCOPY;  Service: Endoscopy;  Laterality: N/A;  IDA/guaiac positive stools   ESOPHAGOGASTRODUODENOSCOPY (EGD) WITH PROPOFOL N/A 12/16/2020   Procedure: ESOPHAGOGASTRODUODENOSCOPY (EGD) WITH PROPOFOL;  Surgeon: Jeani Hawking, MD;  Location: [facility] ENDOSCOPY;  Service: Endoscopy;  Laterality: N/A;   ESOPHAGOGASTRODUODENOSCOPY (EGD) WITH PROPOFOL N/A 06/09/2021   Procedure: ESOPHAGOGASTRODUODENOSCOPY (EGD) WITH PROPOFOL;  Surgeon: Jeani Hawking, MD;  Location: [facility] ENDOSCOPY;  Service: Gastroenterology;  Laterality: N/A;   ESOPHAGOGASTRODUODENOSCOPY (EGD) WITH PROPOFOL N/A 06/29/2022   Procedure: ESOPHAGOGASTRODUODENOSCOPY (EGD) WITH PROPOFOL;  Surgeon: Jeani Hawking, MD;  Location: [facility] ENDOSCOPY;  Service: Gastroenterology;  Laterality: N/A;   GOLD SEED IMPLANT N/A 12/24/2017   Procedure: GOLD SEED IMPLANT, TRANSERINEAL;  Surgeon: Jerilee Field, MD;  Location: [facility] ORS;  Service: Urology;  Laterality: N/A;   INSERT / REPLACE / REMOVE PACEMAKER     LEAD REVISION  10/10/2018   LEAD REVISION/REPAIR N/A 10/10/2018   Procedure: LEAD REVISION/REPAIR;  Surgeon: Marinus Maw, MD;  Location: [facility] INVASIVE CV LAB;  Service: Cardiovascular;  Laterality: N/A;   POLYPECTOMY  06/28/2017   Procedure: POLYPECTOMY;  Surgeon: Jeani Hawking, MD;  Location: [facility] ENDOSCOPY;  Service: Endoscopy;;  ascending and descending colon polyp   PORTACATH  PLACEMENT Right 12/23/2020   Procedure: INSERTION PORT-A-CATH;  Surgeon: Fritzi Mandes, MD;  Location: [facility] OR;  Service: General;  Laterality: Right;   PROSTATE BIOPSY  02/20/2017   SPACE OAR INSTILLATION N/A 12/24/2017   Procedure: SPACE OAR INSTILLATION;  Surgeon: Jerilee Field, MD;  Location: [facility] ORS;  Service: Urology;  Laterality: N/A;   TOTAL KNEE ARTHROPLASTY Right 08/16/2017   Procedure: RIGHT TOTAL KNEE ARTHROPLASTY;  Surgeon: Frederico Hamman, MD;  Location: [facility] OR;  Service: Orthopedics;  Laterality: Right;   UPPER ESOPHAGEAL ENDOSCOPIC ULTRASOUND (EUS) N/A 12/16/2020   Procedure: UPPER ESOPHAGEAL ENDOSCOPIC ULTRASOUND (EUS);  Surgeon: Jeani Hawking, MD;  Location: [facility] ENDOSCOPY;  Service: Endoscopy;  Laterality: N/A;    Current Medications: Current Meds  Medication Sig   atorvastatin (LIPITOR) 40 MG tablet TAKE 1 TABLET(40 MG) BY MOUTH DAILY   Biotin 1000 MCG tablet Take 1,000 mcg by mouth daily with breakfast.   cholecalciferol (VITAMIN D3) 25  MCG (1000 UT) tablet Take 1,000 Units by mouth daily with breakfast.   gabapentin (NEURONTIN) 100 MG capsule TAKE 1 CAPSULE(100 MG) BY MOUTH TWICE DAILY   loratadine (CLARITIN) 10 MG tablet Take 10 mg by mouth daily.   metFORMIN (GLUCOPHAGE-XR) 500 MG 24 hr tablet Take 500 mg by mouth every evening.   metoprolol succinate (TOPROL-XL) 25 MG 24 hr tablet TAKE 1 TABLET(25 MG) BY MOUTH DAILY   multivitamin (ONE-A-DAY MEN'S) TABS tablet Take 1 tablet by mouth daily with breakfast.   Naftifine HCl (NAFTIN) 2 % GEL Apply 1 application  topically every Monday, Wednesday, and Friday.   pantoprazole (PROTONIX) 40 MG tablet Take 1 tablet (40 mg total) by mouth 2 (two) times daily.   rivaroxaban (XARELTO) 20 MG TABS tablet TAKE 1 TABLET BY MOUTH EVERY DAY WITH SUPPER   sacubitril-valsartan (ENTRESTO) 24-26 MG Take 1 tablet by mouth 2 (two) times daily.   Tavaborole 5 % SOLN Apply 1 application  topically daily. Nail on left hand   [DISCONTINUED]  furosemide (LASIX) 40 MG tablet Take 1 tablet (40 mg total) by mouth daily.   [DISCONTINUED] JARDIANCE 10 MG TABS tablet Take 10 mg by mouth daily.   [DISCONTINUED] potassium chloride (KLOR-CON) 10 MEQ tablet TAKE 1 TABLET BY MOUTH TWICE DAILY   [DISCONTINUED] sotalol (BETAPACE) 80 MG tablet TAKE 1 AND 1/2 TABLETS(120 MG) BY MOUTH IN THE MORNING AND AT BEDTIME   [DISCONTINUED] spironolactone (ALDACTONE) 25 MG tablet Take 1 tablet (25 mg total) by mouth daily.     Allergies:   Aspirin and Sulfa antibiotics   Social History   Socioeconomic History   Marital status: Married    Spouse name: Not on file   Number of children: 2   Years of education: Not on file   Highest education level: Not on file  Occupational History   Occupation: retired  Tobacco Use   Smoking status: Never   Smokeless tobacco: Never  Vaping Use   Vaping status: Never Used  Substance and Sexual Activity   Alcohol use: No   Drug use: No   Sexual activity: Not Currently  Other Topics Concern   Not on file  Social History Narrative   Retired Location manager. Married to [name]. Daughter, [name], lives in [place]. Son, Alexandria, lives in [place].   Social Determinants of Health   Financial Resource Strain: Low Risk  (12/26/2021)   Overall Financial Resource Strain (CARDIA)    Difficulty of Paying Living Expenses: Not very hard  Food Insecurity: No Food Insecurity (12/25/2021)   Hunger Vital Sign    Worried About Running Out of Food in the Last Year: Never true    Ran Out of Food in the Last Year: Never true  Transportation Needs: No Transportation Needs (12/26/2021)   PRAPARE - Administrator, Civil Service (Medical): No    Lack of Transportation (Non-Medical): No  Physical Activity: Insufficiently Active (09/26/2021)   Exercise Vital Sign    Days of Exercise per Week: 7 days    Minutes of Exercise per Session: 10 min  Stress: No Stress Concern Present (09/26/2021)   [employer] - Occupational Stress Questionnaire    Feeling of Stress : Not at all  Social Connections: Not on file     Family History: The patient's family history includes Cancer in his father; Diabetes in his father and mother; Healthy in his brother and sister; Heart attack in his brother; Heart disease in  his mother; Heart disease (age of onset: 49) in his brother; Hypertension in his father and mother.  ROS:   Please see the history of present illness.     All other systems reviewed and are negative.  EKGs/Labs/Other Studies Reviewed:    The following studies were reviewed today:   EKG:   Recent Labs: 12/27/2021: Magnesium 1.9 01/09/2022: B Natriuretic Peptide 629.3 07/12/2022: ALT 45; BUN 25; Creatinine 1.23; Hemoglobin 14.0; Platelet Count 182; Potassium 4.1; Sodium 140  Recent Lipid Panel    Component Value Date/Time   CHOL 132 06/06/2016 1453   TRIG 53 06/06/2016 1453   HDL 60 06/06/2016 1453   CHOLHDL 2.2 06/06/2016 1453   LDLCALC 61 06/06/2016 1453     Risk Assessment/Calculations:                Physical Exam:      Physical Exam: Blood pressure (!) 75/60, pulse (!) 45, height 5\' 6"  (1.676 m), weight 182 lb (82.6 kg), SpO2 98%.     Right arm blood pressure-86/60 Left arm blood pressure-75/60  GEN:  Well nourished, well developed in no acute distress HEENT: Normal NECK: No JVD; No carotid bruits LYMPHATICS: No lymphadenopathy CARDIAC: RRR , no murmurs, rubs, gallops RESPIRATORY:  Clear to auscultation without rales, wheezing or rhonchi  ABDOMEN: Soft, non-tender, non-distended MUSCULOSKELETAL:  No edema; No deformity  SKIN: Warm and dry NEUROLOGIC:  Alert and oriented x 3  SKIN: Warm and dry NEUROLOGIC:  Alert and oriented x 3   ASSESSMENT:    No diagnosis found.   PLAN:       Chronic combined systolic and diastolic congestive heart failure.  Continue Entresto, spironolactone, carvedilol.  Blood pressure is a  little low at this time.  We will be stopping the Lasix and potassium.  He has an ICD , managed by Dr. Ladona Ridgel    2.  History of gastric cancer: He is now cancer free.  He is completed his radiation.  Unfortunately, the radiation left him with some peripheral neuropathy.  3.  History of atrial fibrillation: History of atrial fibrillation: He is maintaining sinus rhythm.  He will follow-up with Dr. Ladona Ridgel.  Continue sotalol.  Continue sotalol.           Medication Adjustments/Labs and Tests Ordered: Current medicines are reviewed at length with the patient today.  Concerns regarding medicines are outlined above.  No orders of the defined types were placed in this encounter.  No orders of the defined types were placed in this encounter.   Patient Instructions  Medication Instructions:  STOP Jardiance (should be on Farxiga/Dapaglifozin) STOP Furosemide**please keep tablets in case needed in future to manage volume overload** STOP Potassium Chloride *If you need a refill on your cardiac medications before your next appointment, please call your pharmacy*   Lab Work: NONE If you have labs (blood work) drawn today and your tests are completely normal, you will receive your results only by: MyChart Message (if you have MyChart) OR A paper copy in the mail If you have any lab test that is abnormal or we need to change your treatment, we will call you to review the results.   Testing/Procedures: NONE   Follow-Up: At [facility], you and your health needs are our priority.  As part of our continuing mission to provide you with exceptional heart care, we have created designated Provider Care Teams.  These Care Teams include your primary Cardiologist (physician) and Advanced Practice Providers (APPs -  Physician Assistants and Nurse Practitioners) who all work together to provide you with the care you need, when you need it.  Your next appointment:   3 month(s)  Provider:    Eligha Bridegroom, NP      Signed, Kristeen Miss, MD  08/28/2022 4:59 PM    [facility]

## 2022-08-29 ENCOUNTER — Other Ambulatory Visit (HOSPITAL_COMMUNITY): Payer: Self-pay

## 2022-08-29 DIAGNOSIS — E7800 Pure hypercholesterolemia, unspecified: Secondary | ICD-10-CM | POA: Diagnosis not present

## 2022-08-29 DIAGNOSIS — D6869 Other thrombophilia: Secondary | ICD-10-CM | POA: Diagnosis not present

## 2022-08-29 DIAGNOSIS — I428 Other cardiomyopathies: Secondary | ICD-10-CM | POA: Diagnosis not present

## 2022-08-29 DIAGNOSIS — E1151 Type 2 diabetes mellitus with diabetic peripheral angiopathy without gangrene: Secondary | ICD-10-CM | POA: Diagnosis not present

## 2022-08-29 DIAGNOSIS — E1169 Type 2 diabetes mellitus with other specified complication: Secondary | ICD-10-CM | POA: Diagnosis not present

## 2022-08-29 DIAGNOSIS — I4891 Unspecified atrial fibrillation: Secondary | ICD-10-CM | POA: Diagnosis not present

## 2022-08-29 DIAGNOSIS — E0851 Diabetes mellitus due to underlying condition with diabetic peripheral angiopathy without gangrene: Secondary | ICD-10-CM | POA: Diagnosis not present

## 2022-08-29 DIAGNOSIS — I2510 Atherosclerotic heart disease of native coronary artery without angina pectoris: Secondary | ICD-10-CM | POA: Diagnosis not present

## 2022-08-29 DIAGNOSIS — C61 Malignant neoplasm of prostate: Secondary | ICD-10-CM | POA: Diagnosis not present

## 2022-08-29 MED ORDER — POTASSIUM CHLORIDE ER 10 MEQ PO TBCR: 10.0000 meq | EXTENDED_RELEASE_TABLET | Freq: Two times a day (BID) | ORAL | 0 refills | Status: DC

## 2022-08-29 MED ORDER — EMPAGLIFLOZIN 10 MG PO TABS: 10.0000 mg | ORAL_TABLET | Freq: Every day | ORAL | 3 refills | Status: DC

## 2022-08-29 MED ORDER — FUROSEMIDE 40 MG PO TABS
40.0000 mg | ORAL_TABLET | Freq: Every day | ORAL | 1 refills | Status: DC
  Filled 2022-08-29 – 2022-09-06 (×2): qty 90, 90d supply, fill #0

## 2022-08-29 MED ORDER — POTASSIUM CHLORIDE ER 10 MEQ PO TBCR: 10.0000 meq | EXTENDED_RELEASE_TABLET | Freq: Every day | ORAL | 0 refills | Status: DC

## 2022-08-29 MED ORDER — ACCU-CHEK SOFTCLIX LANCETS MISC: 2 refills | Status: DC

## 2022-08-29 MED FILL — Atorvastatin Calcium Tab 40 MG (Base Equivalent): ORAL | 90 days supply | Qty: 90 | Fill #0 | Status: CN

## 2022-08-30 ENCOUNTER — Other Ambulatory Visit: Payer: Self-pay

## 2022-08-30 ENCOUNTER — Other Ambulatory Visit (HOSPITAL_COMMUNITY): Payer: Self-pay

## 2022-08-30 ENCOUNTER — Inpatient Hospital Stay: Payer: Medicare HMO | Attending: Nurse Practitioner

## 2022-08-30 DIAGNOSIS — Z452 Encounter for adjustment and management of vascular access device: Secondary | ICD-10-CM | POA: Diagnosis not present

## 2022-08-30 DIAGNOSIS — D500 Iron deficiency anemia secondary to blood loss (chronic): Secondary | ICD-10-CM

## 2022-08-30 DIAGNOSIS — Z85028 Personal history of other malignant neoplasm of stomach: Secondary | ICD-10-CM | POA: Insufficient documentation

## 2022-08-30 DIAGNOSIS — Z95828 Presence of other vascular implants and grafts: Secondary | ICD-10-CM

## 2022-08-30 MED ORDER — HEPARIN SOD (PORK) LOCK FLUSH 100 UNIT/ML IV SOLN
500.0000 [IU] | Freq: Once | INTRAVENOUS | Status: AC
  Administered 2022-08-30: 500 [IU]

## 2022-08-30 MED ORDER — SODIUM CHLORIDE 0.9% FLUSH
10.0000 mL | Freq: Once | INTRAVENOUS | Status: AC
  Administered 2022-08-30: 10 mL

## 2022-09-03 ENCOUNTER — Other Ambulatory Visit: Payer: Self-pay | Admitting: Internal Medicine

## 2022-09-03 NOTE — Telephone Encounter (Signed)
Prescription refill request for Xarelto received.  Indication:afib Last office visit:7/24 Weight:82.6  kg Age:72 Scr:1.23  5/24 CrCl:62.49  ml/min  Prescription refilled

## 2022-09-06 ENCOUNTER — Telehealth: Payer: Self-pay | Admitting: Cardiovascular Disease

## 2022-09-06 ENCOUNTER — Other Ambulatory Visit (HOSPITAL_COMMUNITY): Payer: Self-pay

## 2022-09-06 DIAGNOSIS — R600 Localized edema: Secondary | ICD-10-CM

## 2022-09-06 DIAGNOSIS — I5043 Acute on chronic combined systolic (congestive) and diastolic (congestive) heart failure: Secondary | ICD-10-CM

## 2022-09-06 MED ORDER — FUROSEMIDE 20 MG PO TABS: 20.0000 mg | ORAL_TABLET | ORAL | 0 refills | Status: DC

## 2022-09-06 MED ORDER — POTASSIUM CHLORIDE ER 10 MEQ PO TBCR: 20.0000 meq | EXTENDED_RELEASE_TABLET | ORAL | 0 refills | Status: DC

## 2022-09-06 NOTE — Telephone Encounter (Signed)
  [name], Deloris Ping, MD  You5 hours ago (11:08 AM)   See if he can control his edema with the lasix and kdur every other day Please refer him to the advanced CHF clinic  Thanks  PN   Called and spoke with patient who did verbal read-back of instructions. He took a 1/2 tablet of furosemide earlier today at time of call along with KCL, so he will take remaining half tablet now and skip tomorrow and repeat whole tablet of furosemide and KCL on Saturday and continue every other day dosing. Referral placed to HF clinic at this time and pt understands that he will be called to schedule.

## 2022-09-06 NOTE — Telephone Encounter (Signed)
Spoke with the patient's wife who states that the patient has gained about 8 pounds since stopping Lasix. She states that he is also having swelling in his legs. Does reports some shortness of breath but only with exertion.  Reports blood pressure the past several days have been 93/64, 106/74, 105/68, 103/67, 107/70, and 91/71. He is not having any lightheadedness or dizziness. When systolic is less than 95 he holds his Entresto.  Advised to have him go ahead and take 1/2 tablet of lasix and 1 potassium tablet. Offered an appointment for patient to come in for evaluation, however wife declines at this time unless absolutely necessary. She will continue to monitor his blood pressure and symptoms and call back. Will make Dr. Elease Hashimoto aware for further recommendations on medications.

## 2022-09-06 NOTE — Telephone Encounter (Signed)
The medication that the Dr took the patient off of.  Has he right foot is swollen and patient weight is now 190. Patient gain 8 lbs in 8 days. Calling to see if patient can be put back on the medication. Please advise

## 2022-09-06 NOTE — Telephone Encounter (Signed)
Per last Ov note on 08/28/22: Chronic combined systolic and diastolic congestive heart failure.  Continue Entresto, spironolactone, carvedilol.  Blood pressure is a little low at this time.  We will be stopping the Lasix and potassium.  Will route to MD. Pt is still overall asymptomatic and pressures are better after stopping routine lasix, but still soft and pt now needing some furosemide due to swelling. Will verify with MD, but believe next step was to discontinue spironolactone.

## 2022-09-10 ENCOUNTER — Other Ambulatory Visit (HOSPITAL_COMMUNITY): Payer: Self-pay

## 2022-09-10 ENCOUNTER — Other Ambulatory Visit: Payer: Self-pay | Admitting: *Deleted

## 2022-09-10 DIAGNOSIS — I4819 Other persistent atrial fibrillation: Secondary | ICD-10-CM

## 2022-09-10 MED ORDER — SOTALOL HCL 80 MG PO TABS
120.0000 mg | ORAL_TABLET | Freq: Two times a day (BID) | ORAL | 1 refills | Status: DC
Start: 2022-09-10 — End: 2022-10-31
  Filled 2022-09-10: qty 270, 90d supply, fill #0

## 2022-09-10 MED ORDER — SPIRONOLACTONE 25 MG PO TABS
25.0000 mg | ORAL_TABLET | Freq: Every day | ORAL | 1 refills | Status: DC
  Filled 2022-09-10: qty 90, 90d supply, fill #0

## 2022-09-10 NOTE — Telephone Encounter (Signed)
Pt's spouse calling in today to change pt's pharmacy and send in Sotalol and Spirnalactone to [facility].

## 2022-09-13 ENCOUNTER — Telehealth (HOSPITAL_COMMUNITY): Payer: Self-pay | Admitting: Cardiology

## 2022-09-13 NOTE — Telephone Encounter (Signed)
Dr Kristeen Miss office, sent a referral in epic on 07/30. Please advise

## 2022-09-14 ENCOUNTER — Ambulatory Visit (HOSPITAL_COMMUNITY): Payer: Medicare HMO

## 2022-09-17 ENCOUNTER — Telehealth: Payer: Self-pay | Admitting: Cardiovascular Disease

## 2022-09-17 NOTE — Telephone Encounter (Signed)
September 06, 2022 Note   [name], Deloris Ping, MD  You5 hours ago (11:08 AM)    See if he can control his edema with the lasix and kdur every other day Please refer him to the advanced CHF clinic  Thanks  PN    Called and spoke with patient who did verbal read-back of instructions. He took a 1/2 tablet of furosemide earlier today at time of call along with KCL, so he will take remaining half tablet now and skip tomorrow and repeat whole tablet of furosemide and KCL on Saturday and continue every other day dosing. Referral placed to HF clinic at this time and pt understands that he will be called to schedule.       Returned call to wife on [place] and informed her that he should be continuing the every other day dosing until he sees HF clinic. No further questions.

## 2022-09-17 NOTE — Telephone Encounter (Signed)
  Pt c/o medication issue:  1. Name of Medication: lasix and potassium   2. How are you currently taking this medication (dosage and times per day)?   3. Are you having a reaction (difficulty breathing--STAT)?   4. What is your medication issue? The patient's wife is calling to inquire about how long the patient needs to continue taking these medications and how long he needs to follow the skipping dose regimen?

## 2022-09-21 ENCOUNTER — Ambulatory Visit (HOSPITAL_COMMUNITY)
Admission: RE | Admit: 2022-09-21 | Discharge: 2022-09-21 | Disposition: A | Discharge status: Home / Self Care | Payer: Medicare HMO | Source: Ambulatory Visit | Attending: Cardiology | Admitting: Cardiology

## 2022-09-21 ENCOUNTER — Other Ambulatory Visit (HOSPITAL_COMMUNITY): Payer: Self-pay

## 2022-09-21 ENCOUNTER — Other Ambulatory Visit: Payer: Self-pay | Admitting: Nurse Practitioner

## 2022-09-21 ENCOUNTER — Encounter: Payer: Self-pay | Admitting: Hematology

## 2022-09-21 ENCOUNTER — Other Ambulatory Visit: Payer: Self-pay

## 2022-09-21 ENCOUNTER — Telehealth: Payer: Self-pay | Admitting: Cardiovascular Disease

## 2022-09-21 DIAGNOSIS — I5043 Acute on chronic combined systolic (congestive) and diastolic (congestive) heart failure: Secondary | ICD-10-CM | POA: Diagnosis not present

## 2022-09-21 DIAGNOSIS — R600 Localized edema: Secondary | ICD-10-CM

## 2022-09-21 LAB — VAS US PAD ABI
Left ABI: 0.95
Right ABI: 0.94

## 2022-09-21 MED ORDER — EMPAGLIFLOZIN 10 MG PO TABS
10.0000 mg | ORAL_TABLET | Freq: Every day | ORAL | 11 refills | Status: DC
  Filled 2022-09-21: qty 30, 30d supply, fill #0
  Filled 2022-10-22: qty 30, 30d supply, fill #1

## 2022-09-21 MED ORDER — METFORMIN HCL ER 500 MG PO TB24
500.0000 mg | ORAL_TABLET | Freq: Every day | ORAL | 3 refills | Status: DC
  Filled 2022-09-21: qty 90, 90d supply, fill #0
  Filled 2023-01-04: qty 90, 90d supply, fill #1
  Filled 2023-03-25 (×2): qty 90, 90d supply, fill #2
  Filled 2023-06-28: qty 90, 90d supply, fill #3

## 2022-09-21 MED ORDER — GABAPENTIN 100 MG PO CAPS
100.0000 mg | ORAL_CAPSULE | Freq: Two times a day (BID) | ORAL | 1 refills | Status: DC
  Filled 2022-09-21: qty 180, 90d supply, fill #0
  Filled 2022-12-19: qty 180, 90d supply, fill #1

## 2022-09-21 NOTE — Telephone Encounter (Signed)
Pt c/o medication issue:  1. Name of Medication: Jardiance   2. How are you currently taking this medication (dosage and times per day)? Not currently taking   3. Are you having a reaction (difficulty breathing--STAT)?   4. What is your medication issue? Patient is calling stating that they would like to switch to this medication due to cost of Marcelline Deist being too high. Requesting it sent to:  [facility]

## 2022-09-21 NOTE — Telephone Encounter (Signed)
Pt currently on Farxiga and reports that the generic is still too expensive for him. He would like to go back to [place] which is more affordable with him through [place]. Jardiance sent to requested pharmacy and MAR updated at this time.

## 2022-09-23 ENCOUNTER — Other Ambulatory Visit: Payer: Self-pay

## 2022-09-26 ENCOUNTER — Other Ambulatory Visit (HOSPITAL_COMMUNITY): Payer: Self-pay

## 2022-09-26 DIAGNOSIS — L851 Acquired keratosis [keratoderma] palmaris et plantaris: Secondary | ICD-10-CM | POA: Diagnosis not present

## 2022-09-26 DIAGNOSIS — B353 Tinea pedis: Secondary | ICD-10-CM | POA: Diagnosis not present

## 2022-09-26 DIAGNOSIS — I739 Peripheral vascular disease, unspecified: Secondary | ICD-10-CM | POA: Diagnosis not present

## 2022-09-26 DIAGNOSIS — B351 Tinea unguium: Secondary | ICD-10-CM | POA: Diagnosis not present

## 2022-09-26 DIAGNOSIS — E1142 Type 2 diabetes mellitus with diabetic polyneuropathy: Secondary | ICD-10-CM | POA: Diagnosis not present

## 2022-09-26 DIAGNOSIS — M792 Neuralgia and neuritis, unspecified: Secondary | ICD-10-CM | POA: Diagnosis not present

## 2022-09-26 MED ORDER — KETOCONAZOLE 2 % EX CREA
1.0000 | TOPICAL_CREAM | Freq: Two times a day (BID) | CUTANEOUS | 3 refills | Status: DC
  Filled 2022-09-26: qty 30, 15d supply, fill #0
  Filled 2022-11-13: qty 30, 15d supply, fill #1
  Filled 2022-11-29: qty 30, 15d supply, fill #2
  Filled 2023-05-20: qty 30, 30d supply, fill #2
  Filled 2023-05-20: qty 30, 15d supply, fill #2

## 2022-10-03 ENCOUNTER — Telehealth (HOSPITAL_COMMUNITY): Payer: Self-pay | Admitting: Cardiology

## 2022-10-03 NOTE — Telephone Encounter (Signed)
Lvm to schedule np appt.

## 2022-10-04 NOTE — Telephone Encounter (Signed)
OK to schedule for new patient appt.  Please contact

## 2022-10-05 ENCOUNTER — Ambulatory Visit: Payer: Medicare HMO | Admitting: Cardiovascular Disease

## 2022-10-08 ENCOUNTER — Other Ambulatory Visit (HOSPITAL_COMMUNITY): Payer: Self-pay

## 2022-10-09 ENCOUNTER — Inpatient Hospital Stay: Payer: Medicare HMO | Attending: Nurse Practitioner

## 2022-10-09 ENCOUNTER — Ambulatory Visit (HOSPITAL_COMMUNITY)
Admission: RE | Admit: 2022-10-09 | Discharge: 2022-10-09 | Disposition: A | Discharge status: Home / Self Care | Payer: Medicare HMO | Source: Ambulatory Visit | Attending: Hematology | Admitting: Hematology

## 2022-10-09 DIAGNOSIS — R932 Abnormal findings on diagnostic imaging of liver and biliary tract: Secondary | ICD-10-CM | POA: Diagnosis not present

## 2022-10-09 DIAGNOSIS — C161 Malignant neoplasm of fundus of stomach: Secondary | ICD-10-CM | POA: Insufficient documentation

## 2022-10-09 DIAGNOSIS — Z95828 Presence of other vascular implants and grafts: Secondary | ICD-10-CM

## 2022-10-09 DIAGNOSIS — C169 Malignant neoplasm of stomach, unspecified: Secondary | ICD-10-CM | POA: Insufficient documentation

## 2022-10-09 DIAGNOSIS — Z85028 Personal history of other malignant neoplasm of stomach: Secondary | ICD-10-CM | POA: Diagnosis not present

## 2022-10-09 DIAGNOSIS — C61 Malignant neoplasm of prostate: Secondary | ICD-10-CM | POA: Insufficient documentation

## 2022-10-09 DIAGNOSIS — Z08 Encounter for follow-up examination after completed treatment for malignant neoplasm: Secondary | ICD-10-CM | POA: Insufficient documentation

## 2022-10-09 DIAGNOSIS — K769 Liver disease, unspecified: Secondary | ICD-10-CM | POA: Diagnosis not present

## 2022-10-09 DIAGNOSIS — D500 Iron deficiency anemia secondary to blood loss (chronic): Secondary | ICD-10-CM

## 2022-10-09 LAB — CMP (CANCER CENTER ONLY)
ALT: 37 U/L (ref 0–44)
AST: 26 U/L (ref 15–41)
Albumin: 4.3 g/dL (ref 3.5–5.0)
Alkaline Phosphatase: 55 U/L (ref 38–126)
Anion gap: 7 (ref 5–15)
BUN: 22 mg/dL (ref 8–23)
CO2: 25 mmol/L (ref 22–32)
Calcium: 9.2 mg/dL (ref 8.9–10.3)
Chloride: 109 mmol/L (ref 98–111)
Creatinine: 1.13 mg/dL (ref 0.61–1.24)
GFR, Estimated: >60 mL/min (ref >60)
Glucose, Bld: 86 mg/dL (ref 70–99)
Potassium: 4 mmol/L (ref 3.5–5.1)
Sodium: 141 mmol/L (ref 135–145)
Total Bilirubin: 1.2 mg/dL (ref 0.3–1.2)
Total Protein: 6.6 g/dL (ref 6.5–8.1)

## 2022-10-09 LAB — CBC WITH DIFFERENTIAL (CANCER CENTER ONLY)
Abs Immature Granulocytes: 0.01 10*3/uL (ref 0.00–0.07)
Basophils Absolute: 0 10*3/uL (ref 0.0–0.1)
Basophils Relative: 0 %
Eosinophils Absolute: 0.2 10*3/uL (ref 0.0–0.5)
Eosinophils Relative: 3 %
HCT: 40.8 % (ref 39.0–52.0)
Hemoglobin: 13.6 g/dL (ref 13.0–17.0)
Immature Granulocytes: 0 %
Lymphocytes Relative: 16 %
Lymphs Abs: 1 10*3/uL (ref 0.7–4.0)
MCH: 33 pg (ref 26.0–34.0)
MCHC: 33.3 g/dL (ref 30.0–36.0)
MCV: 99 fL (ref 80.0–100.0)
Monocytes Absolute: 0.7 10*3/uL (ref 0.1–1.0)
Monocytes Relative: 11 %
Neutro Abs: 4.2 10*3/uL (ref 1.7–7.7)
Neutrophils Relative %: 70 %
Platelet Count: 143 10*3/uL — ABNORMAL LOW (ref 150–400)
RBC: 4.12 MIL/uL — ABNORMAL LOW (ref 4.22–5.81)
RDW: 13.8 % (ref 11.5–15.5)
WBC Count: 6.1 10*3/uL (ref 4.0–10.5)
nRBC: 0 % (ref 0.0–0.2)

## 2022-10-09 LAB — FERRITIN: Ferritin: 29 ng/mL (ref 24–336)

## 2022-10-09 MED ORDER — SODIUM CHLORIDE 0.9% FLUSH
10.0000 mL | Freq: Once | INTRAVENOUS | Status: AC
  Administered 2022-10-09: 10 mL

## 2022-10-09 MED ORDER — HEPARIN SOD (PORK) LOCK FLUSH 100 UNIT/ML IV SOLN
INTRAVENOUS | Status: AC
  Filled 2022-10-09: qty 5

## 2022-10-09 MED ORDER — HEPARIN SOD (PORK) LOCK FLUSH 100 UNIT/ML IV SOLN
500.0000 [IU] | Freq: Once | INTRAVENOUS | Status: AC
  Administered 2022-10-09: 500 [IU] via INTRAVENOUS

## 2022-10-09 MED ORDER — IOHEXOL 300 MG/ML  SOLN
100.0000 mL | Freq: Once | INTRAMUSCULAR | Status: AC | PRN
  Administered 2022-10-09: 100 mL via INTRAVENOUS

## 2022-10-10 ENCOUNTER — Ambulatory Visit (INDEPENDENT_AMBULATORY_CARE_PROVIDER_SITE_OTHER): Payer: Medicare HMO

## 2022-10-10 DIAGNOSIS — I428 Other cardiomyopathies: Secondary | ICD-10-CM | POA: Diagnosis not present

## 2022-10-10 LAB — CUP PACEART REMOTE DEVICE CHECK
Battery Remaining Longevity: 22 mo
Battery Remaining Percentage: 23 %
Battery Voltage: 2.8 V
Brady Statistic AP VP Percent: 0 %
Brady Statistic AP VS Percent: 0 %
Brady Statistic AS VP Percent: 100 %
Brady Statistic AS VS Percent: 0 %
Brady Statistic RA Percent Paced: 1 %
Brady Statistic RV Percent Paced: 90 %
Date Time Interrogation Session: 20240828020017
HighPow Impedance: 55 Ohm
HighPow Impedance: 55 Ohm
Implantable Lead Connection Status: 753985
Implantable Lead Connection Status: 753985
Implantable Lead Implant Date: 20180413
Implantable Lead Implant Date: 20200828
Implantable Lead Location: 753859
Implantable Lead Location: 753860
Implantable Lead Model: 7122
Implantable Pulse Generator Implant Date: 20180413
Lead Channel Impedance Value: 360 Ohm
Lead Channel Impedance Value: 690 Ohm
Lead Channel Pacing Threshold Amplitude: 0.75 V
Lead Channel Pacing Threshold Amplitude: 1 V
Lead Channel Pacing Threshold Pulse Width: 0.5 ms
Lead Channel Pacing Threshold Pulse Width: 0.5 ms
Lead Channel Sensing Intrinsic Amplitude: 2.3 mV
Lead Channel Sensing Intrinsic Amplitude: 7 mV
Lead Channel Setting Pacing Amplitude: 2 V
Lead Channel Setting Pacing Amplitude: 2.5 V
Lead Channel Setting Pacing Pulse Width: 0.5 ms
Lead Channel Setting Sensing Sensitivity: 0.5 mV
Pulse Gen Serial Number: 7398151
Zone Setting Status: 755011

## 2022-10-11 ENCOUNTER — Inpatient Hospital Stay: Payer: Medicare HMO | Admitting: Hematology

## 2022-10-11 ENCOUNTER — Encounter: Payer: Self-pay | Admitting: Hematology

## 2022-10-11 VITALS — BP 97/68 | HR 63 | Temp 97.9°F | Resp 15 | Ht 66.0 in | Wt 184.6 lb

## 2022-10-11 DIAGNOSIS — C61 Malignant neoplasm of prostate: Secondary | ICD-10-CM | POA: Diagnosis not present

## 2022-10-11 DIAGNOSIS — C163 Malignant neoplasm of pyloric antrum: Secondary | ICD-10-CM

## 2022-10-11 DIAGNOSIS — C169 Malignant neoplasm of stomach, unspecified: Secondary | ICD-10-CM | POA: Diagnosis not present

## 2022-10-11 NOTE — Assessment & Plan Note (Signed)
-  diagnosed with Gleason 4+4 prostate cancer in early 2019. He was treated with long-term ADT in combination with 8 weeks of IMRT.

## 2022-10-11 NOTE — Assessment & Plan Note (Signed)
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  S/p FLOT4 01/02/2021 - 03/22/2021 -Posttreatment EUS 05/2021 and 06/29/2022 by Dr. Elnoria Howard showed a scar and residual erythema in stomach, biopsy negative for residual malignancy.  -Jon Williams is clinically doing well on surveillance. Last CT 03/20/22 which shows no evidence of recurrent or metastatic disease, and stable bilateral adrenal adenomas -Surveillance CT scan from October 09, 2022 showed no evidence of recurrence -He is clinically doing well overall, will continue monitoring.

## 2022-10-11 NOTE — Progress Notes (Signed)
[facility]   Telephone:([phone] Fax:([phone]) [phone]   Clinic Follow up Note   Patient Care Team: Renford Dills, MD as PCP - General (Internal Medicine) [name], Deloris Ping, MD as PCP - Cardiology (Cardiology) Marinus Maw, MD as PCP - Electrophysiology (Cardiology) Fritzi Mandes, MD as Consulting Physician (General Surgery) Malachy Mood, MD as Consulting Physician (Hematology) Jeani Hawking, MD as Consulting Physician (Gastroenterology)  Date of Service:  10/11/2022  CHIEF COMPLAINT: f/u of  gastric cancer     CURRENT THERAPY:  Surveillance  ASSESSMENT:  Jon Williams is a 72 y.o. male with   Malignant neoplasm of prostate [facility]) -diagnosed with Gleason 4+4 prostate cancer in early 2019. He was treated with long-term ADT in combination with 8 weeks of IMRT.   Gastric cancer (HCC) cT3N0M0, stage II -Diagnosed 11/2020, path showed loss of MLH1 and PMS2 which predicts good response to immunotherapy  -Deemed a high risk surgical candidate.  S/p FLOT4 01/02/2021 - 03/22/2021 -Posttreatment EUS 05/2021 and 06/29/2022 by Dr. Elnoria Howard showed a scar and residual erythema in stomach, biopsy negative for residual malignancy.  -Jon Williams is clinically doing well on surveillance. Last CT 03/20/22 which shows no evidence of recurrent or metastatic disease, and stable bilateral adrenal adenomas -Surveillance CT scan from October 09, 2022 showed no evidence of recurrence -He is clinically doing well overall, will continue monitoring. -Due to his significant cardiac comorbidity, he is not able to repeat endoscopy.  Will monitor his disease with CT scans.   PLAN: - reviewed CT scan no cancer at this time - ok to remove port is patient desires after next scan - f/u with flush in 2 months - f/u appointment in 4 months, next CT in 8 months    SUMMARY OF ONCOLOGIC HISTORY: Oncology History Overview Note  Cancer Staging Gastric cancer [facility] [place]) Staging form: Stomach, AJCC 8th  Edition - Clinical stage from 12/01/2020: Stage IIB (cT3, cN0, cM0) - Signed by Malachy Mood, MD on 12/20/2020 Stage prefix: Initial diagnosis Total positive nodes: 0  Malignant neoplasm of prostate (HCC) Staging form: Prostate, AJCC 8th Edition - Clinical: Stage IIC (cT2b, cN0, cM0, PSA: 6.7, Grade Group: 4) - Unsigned Prostate specific antigen (PSA) range: Less than 10 Gleason score: 8 Histologic grading system: 5 grade system    Gastric cancer (HCC)  12/01/2020 Procedure   Upper Endoscopy, Dr. Elnoria Howard  Impression: - Normal esophagus. 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 Biopsied. - Normal examined duodenum.   12/01/2020 Pathology Results   FINAL MICROSCOPIC DIAGNOSIS:   A. INCISURA, BIOPSY:  - Adenocarcinoma, moderate to poorly differentiated arising in a  background of chronic gastritis with intestinal metaplasia.    12/01/2020 Imaging   CT CAP  IMPRESSION: Focal wall thickening along the posterior aspect of the gastric antrum, likely corresponding to the patient's newly diagnosed gastric cancer.   No findings suspicious for metastatic disease.   Mild multifocal pneumonia, lower lobe predominant, likely on the basis of aspiration. Trace right pleural effusion.   Fiducial markers along the prostate in this patient with known prostate cancer.   12/01/2020 Cancer Staging   Staging form: Stomach, AJCC 8th Edition - Clinical stage from 12/01/2020: Stage IIB (cT3, cN0, cM0) - Signed by Malachy Mood, MD on 12/20/2020 Stage prefix: Initial diagnosis Total positive nodes: 0   12/08/2020 Initial Diagnosis   Gastric cancer (HCC)   12/16/2020 Procedure   EUS  - Wall thickening was seen in the lesser curve of the stomach and in  the antrum of the stomach. The thickening appeared to be primarily within the serosa (Layer 5). T3 N0 Mx. - There was no sign of significant pathology in the entire pancreas. - There was no sign of significant pathology in the common bile duct and  in the gallbladder. - There was no evidence of significant pathology in the left lobe of the liver. - Endosonographic images of the left adrenal gland were unremarkable. - The celiac trunk and superior mesenteric artery were endosonographically normal. - The mediastinum was unremarkable endosonographically. - No specimens collected.   01/02/2021 - 03/23/2021 Chemotherapy   Patient is on Treatment Plan : GASTROESOPHAGEAL FLOT q14d X 4 cycles     04/01/2021 Imaging   EXAM: CT ABDOMEN AND PELVIS WITHOUT CONTRAST  IMPRESSION: 1. No acute findings in the abdomen or pelvis. Specifically, no evidence for metastatic disease in the abdomen or pelvis. 2. Stable 16 mm left adrenal adenoma. 3. Small fat containing hernias in the right groin and umbilical region. 4. Bilateral pars interarticularis defects at L4 with 10 mm anterolisthesis of L4 on 5, stable. 5. Aortic Atherosclerosis (ICD10-I70.0).   04/13/2021 Imaging   EXAM: CT CHEST WITHOUT CONTRAST  IMPRESSION: 1. No evidence of metastatic disease in the chest. 2. Enlargement of the main pulmonary artery, as can be seen in pulmonary hypertension. 3. Coronary artery disease.   Aortic Atherosclerosis (ICD10-I70.0).   06/09/2021 Procedure   Upper GI Endoscopy, Dr. Elnoria Howard  Findings: -The esophagus was normal. -A medium healed ulcer was found on the lesser curvature of the stomach. Biopsies were taken with a cold forceps for histology. -The examined duodenum was normal. -Compared to the prior EGD there is a marked improvement in his gastric cancer. There was only evidence of erythema and a central scar at the incisura. Cold biopsies of the area were obtained, but there was no gross evidence of malignancy.  Impression: - Normal esophagus. - Scar in the lesser curvature of the stomach. Biopsied. - Normal examined duodenum.   06/09/2021 Pathology Results   FINAL MICROSCOPIC DIAGNOSIS:   A. STOMACH, BIOPSY:  - Gastric mucosa with acute and  chronic inflammation.  - No dysplasia or malignancy identified.    06/16/2021 - 06/16/2021 Chemotherapy   Patient is on Treatment Plan : GASTROESOPHAGEAL Pembrolizumab (200) q21d        INTERVAL HISTORY:  Jon Williams is here for a follow up of gastric cancer. He was last seen by me on 07/12/2022. He presents to clinic with his wife. He is doing well overall. He has recovered well from therapy.   All other systems were reviewed with the patient and are negative.  MEDICAL HISTORY:  Past Medical History:  Diagnosis Date   AICD (automatic cardioverter/defibrillator) present 05/25/2016   biv icd   Bunion, right    Chronic combined systolic and diastolic CHF (congestive heart failure) (HCC)    Echo 1/18: Mild conc LVH, EF 15-20, severe diff HK, inf and inf-septal AK, Gr 3 DD, mild to mod Jon, severe LAE, mod reduced RVSF, mod RAE, mild TR, PASP 50   Coronary artery disease involving native coronary artery without angina pectoris 04/17/2016   LHC 1/18: pLCx 30, mLCx 20, mRCA 40, dRCA 20, LVEDP 23, mean RA 8, PA 42/20, PCWP 17   Diabetes mellitus without complication (HCC)    Gastric cancer (HCC)    History of atrial fibrillation    History of atrial flutter    History of cardiomegaly 06/07/2016   Noted on CXR  History of colon polyps 06/28/2017   Noted on colonoscopy   Hypertension    LBBB (left bundle branch block)    Nausea vomiting and diarrhea 04/04/2021   NICM (nonischemic cardiomyopathy) (HCC)    Echo 1/18:  Mild conc LVH, EF 15-20, severe diff HK, inf and inf-septal AK, Gr 3 DD, mild to mod Jon, severe LAE, mod reduced RVSF, mod RAE, mild TR, PASP 50   Other secondary pulmonary hypertension (HCC) 04/17/2016   Prostate cancer (HCC) 2019   Sigmoid diverticulosis 06/28/2017   Noted on colonoscopy    SURGICAL HISTORY: Past Surgical History:  Procedure Laterality Date   BIOPSY  12/01/2020   Procedure: BIOPSY;  Surgeon: Jeani Hawking, MD;  Location: [facility] ENDOSCOPY;  Service:  Endoscopy;;   BIOPSY  06/09/2021   Procedure: BIOPSY;  Surgeon: Jeani Hawking, MD;  Location: [facility] ENDOSCOPY;  Service: Gastroenterology;;   BIOPSY  06/29/2022   Procedure: BIOPSY;  Surgeon: Jeani Hawking, MD;  Location: [facility] ENDOSCOPY;  Service: Gastroenterology;;   BIV ICD INSERTION CRT-D N/A 05/25/2016   Procedure: BiV ICD Insertion CRT-D;  Surgeon: Marinus Maw, MD;  Location: [name] INVASIVE CV LAB;  Service: Cardiovascular;  Laterality: N/A;   CARDIAC CATHETERIZATION N/A 03/02/2016   Procedure: Right/Left Heart Cath and Coronary Angiography;  Surgeon: Yvonne Kendall, MD;  Location: [facility] INVASIVE CV LAB;  Service: Cardiovascular;  Laterality: N/A;   CARDIOVERSION N/A 07/17/2016   Procedure: Cardioversion;  Surgeon: Marinus Maw, MD;  Location: [facility] INVASIVE CV LAB;  Service: Cardiovascular;  Laterality: N/A;   COLONOSCOPY WITH PROPOFOL N/A 06/28/2017   Procedure: COLONOSCOPY WITH PROPOFOL;  Surgeon: Jeani Hawking, MD;  Location: [facility] ENDOSCOPY;  Service: Endoscopy;  Laterality: N/A;   colonscopy  2009   ESOPHAGOGASTRODUODENOSCOPY N/A 12/01/2020   Procedure: ESOPHAGOGASTRODUODENOSCOPY (EGD);  Surgeon: Jeani Hawking, MD;  Location: [facility] ENDOSCOPY;  Service: Endoscopy;  Laterality: N/A;  IDA/guaiac positive stools   ESOPHAGOGASTRODUODENOSCOPY (EGD) WITH PROPOFOL N/A 12/16/2020   Procedure: ESOPHAGOGASTRODUODENOSCOPY (EGD) WITH PROPOFOL;  Surgeon: Jeani Hawking, MD;  Location: [facility] ENDOSCOPY;  Service: Endoscopy;  Laterality: N/A;   ESOPHAGOGASTRODUODENOSCOPY (EGD) WITH PROPOFOL N/A 06/09/2021   Procedure: ESOPHAGOGASTRODUODENOSCOPY (EGD) WITH PROPOFOL;  Surgeon: Jeani Hawking, MD;  Location: [facility] ENDOSCOPY;  Service: Gastroenterology;  Laterality: N/A;   ESOPHAGOGASTRODUODENOSCOPY (EGD) WITH PROPOFOL N/A 06/29/2022   Procedure: ESOPHAGOGASTRODUODENOSCOPY (EGD) WITH PROPOFOL;  Surgeon: Jeani Hawking, MD;  Location: [facility] ENDOSCOPY;  Service: Gastroenterology;  Laterality: N/A;   GOLD SEED IMPLANT N/A 12/24/2017    Procedure: GOLD SEED IMPLANT, TRANSERINEAL;  Surgeon: Jerilee Field, MD;  Location: [facility] ORS;  Service: Urology;  Laterality: N/A;   INSERT / REPLACE / REMOVE PACEMAKER     LEAD REVISION  10/10/2018   LEAD REVISION/REPAIR N/A 10/10/2018   Procedure: LEAD REVISION/REPAIR;  Surgeon: Marinus Maw, MD;  Location: [facility] INVASIVE CV LAB;  Service: Cardiovascular;  Laterality: N/A;   POLYPECTOMY  06/28/2017   Procedure: POLYPECTOMY;  Surgeon: Jeani Hawking, MD;  Location: [facility] ENDOSCOPY;  Service: Endoscopy;;  ascending and descending colon polyp   PORTACATH PLACEMENT Right 12/23/2020   Procedure: INSERTION PORT-A-CATH;  Surgeon: Fritzi Mandes, MD;  Location: [facility] OR;  Service: General;  Laterality: Right;   PROSTATE BIOPSY  02/20/2017   SPACE OAR INSTILLATION N/A 12/24/2017   Procedure: SPACE OAR INSTILLATION;  Surgeon: Jerilee Field, MD;  Location: [facility] ORS;  Service: Urology;  Laterality: N/A;   TOTAL KNEE ARTHROPLASTY Right 08/16/2017   Procedure: RIGHT TOTAL KNEE ARTHROPLASTY;  Surgeon: Frederico Hamman, MD;  Location: [facility]  OR;  Service: Orthopedics;  Laterality: Right;   UPPER ESOPHAGEAL ENDOSCOPIC ULTRASOUND (EUS) N/A 12/16/2020   Procedure: UPPER ESOPHAGEAL ENDOSCOPIC ULTRASOUND (EUS);  Surgeon: Jeani Hawking, MD;  Location: [facility] ENDOSCOPY;  Service: Endoscopy;  Laterality: N/A;    I have reviewed the social history and family history with the patient and they are unchanged from previous note.  ALLERGIES:  is allergic to aspirin and sulfa antibiotics.  MEDICATIONS:  Current Outpatient Medications  Medication Sig Dispense Refill   Accu-Chek Softclix Lancets lancets Use to check blood sugar daily 100 each 2   atorvastatin (LIPITOR) 40 MG tablet Take 1 tablet (40 mg total) by mouth daily. 90 tablet 1   Biotin 1000 MCG tablet Take 1,000 mcg by mouth daily with breakfast.     cholecalciferol (VITAMIN D3) 25 MCG (1000 UT) tablet Take 1,000 Units by mouth daily with breakfast.     empagliflozin  (JARDIANCE) 10 MG TABS tablet Take 1 tablet (10 mg total) by mouth daily before breakfast. Pt requesting Jardiance instead of Farxiga 30 tablet 11   furosemide (LASIX) 20 MG tablet Take 1 tablet (20 mg total) by mouth every other day. 45 tablet 0   gabapentin (NEURONTIN) 100 MG capsule Take 1 capsule (100 mg total) by mouth 2 (two) times daily. 180 capsule 1   ketoconazole (NIZORAL) 2 % cream Apply 1 Application topically 2 (two) times daily. 30 g 3   loratadine (CLARITIN) 10 MG tablet Take 10 mg by mouth daily.     metFORMIN (GLUCOPHAGE-XR) 500 MG 24 hr tablet Take 500 mg by mouth every evening.     metFORMIN (GLUCOPHAGE-XR) 500 MG 24 hr tablet Take 1 tablet (500 mg total) by mouth with evening meal 90 tablet 3   metoprolol succinate (TOPROL-XL) 25 MG 24 hr tablet Take 1 tablet (25 mg total) by mouth daily. 90 tablet 1   multivitamin (ONE-A-DAY MEN'S) TABS tablet Take 1 tablet by mouth daily with breakfast.     Naftifine HCl (NAFTIN) 2 % GEL Apply 1 application  topically every Monday, Wednesday, and Friday.     pantoprazole (PROTONIX) 40 MG tablet Take 1 tablet (40 mg total) by mouth 2 (two) times daily. 60 tablet 2   potassium chloride (KLOR-CON) 10 MEQ tablet Take 2 tablets (20 mEq total) by mouth every other day. 90 tablet 0   sacubitril-valsartan (ENTRESTO) 24-26 MG Take 1 tablet by mouth 2 (two) times daily. 180 tablet 3   sotalol (BETAPACE) 80 MG tablet Take 1.5 tablets (120 mg total) by mouth in the morning and at bedtime. 270 tablet 1   spironolactone (ALDACTONE) 25 MG tablet Take 1 tablet (25 mg total) by mouth daily. 90 tablet 1   Tavaborole 5 % SOLN Apply 1 application  topically daily. Nail on left hand     XARELTO 20 MG TABS tablet TAKE 1 TABLET BY MOUTH EVERY DAY WITH DINNER 90 tablet 1   No current facility-administered medications for this visit.    PHYSICAL EXAMINATION: ECOG PERFORMANCE STATUS: 1 - Symptomatic but completely ambulatory  Vitals:   10/11/22 1046  BP: 97/68   Pulse: 63  Resp: 15  Temp: 97.9 F (36.6 C)  SpO2: 100%   Wt Readings from Last 3 Encounters:  10/11/22 184 lb 9.6 oz (83.7 kg)  08/28/22 182 lb (82.6 kg)  07/12/22 182 lb 4.8 oz (82.7 kg)     GENERAL:alert, no distress and comfortable SKIN: skin color, texture, turgor are normal, no rashes or significant lesions EYES: normal,  Conjunctiva are pink and non-injected, sclera clear NECK: supple, thyroid normal size, non-tender, without nodularity LYMPH:  no palpable lymphadenopathy in the cervical, axillary  LUNGS: clear to auscultation and percussion with normal breathing effort HEART: regular rate & rhythm and no murmurs and no lower extremity edema ABDOMEN:abdomen soft, non-tender and normal bowel sounds Musculoskeletal:no cyanosis of digits and no clubbing  NEURO: alert & oriented x 3 with fluent speech, no focal motor/sensory deficits  LABORATORY DATA:  I have reviewed the data as listed    Latest Ref Rng & Units 10/09/2022    9:56 AM 07/12/2022   10:12 AM 03/22/2022   10:26 AM  CBC  WBC 4.0 - 10.5 K/uL 6.1  5.2  5.2   Hemoglobin 13.0 - 17.0 g/dL 09.8  11.9  14.7   Hematocrit 39.0 - 52.0 % 40.8  41.5  41.0   Platelets 150 - 400 K/uL 143  182  171         Latest Ref Rng & Units 10/09/2022    9:56 AM 07/12/2022   10:12 AM 03/22/2022   10:26 AM  CMP  Glucose 70 - 99 mg/dL 86  829  91   BUN 8 - 23 mg/dL 22  25  15    Creatinine 0.61 - 1.24 mg/dL 5.62  1.30  8.65   Sodium 135 - 145 mmol/L 141  140  140   Potassium 3.5 - 5.1 mmol/L 4.0  4.1  3.9   Chloride 98 - 111 mmol/L 109  108  105   CO2 22 - 32 mmol/L 25  26  27    Calcium 8.9 - 10.3 mg/dL 9.2  9.4  9.3   Total Protein 6.5 - 8.1 g/dL 6.6  7.5  6.8   Total Bilirubin 0.3 - 1.2 mg/dL 1.2  0.9  0.9   Alkaline Phos 38 - 126 U/L 55  59  45   AST 15 - 41 U/L 26  30  18    ALT 0 - 44 U/L 37  45  13       RADIOGRAPHIC STUDIES: I have personally reviewed the radiological images as listed and agreed with the findings in the  report. CUP PACEART REMOTE DEVICE CHECK  Result Date: 10/10/2022 Scheduled remote reviewed. Normal device function.  AF burden > 99%.  Ventricular rates well controlled.  Known persistent AF, on Xarelto per Epic. Next remote 91 days. - CS, CVRS     No orders of the defined types were placed in this encounter.  All questions were answered. The patient knows to call the clinic with any problems, questions or concerns. No barriers to learning was detected. The total time spent in the appointment was 25 minutes.     Malachy Mood, MD 10/11/2022   I, Sharlette Dense, CMA, am acting as scribe for Malachy Mood, MD.   I have reviewed the above documentation for accuracy and completeness, and I agree with the above.

## 2022-10-16 ENCOUNTER — Telehealth (HOSPITAL_COMMUNITY): Payer: Self-pay

## 2022-10-16 ENCOUNTER — Other Ambulatory Visit: Payer: Self-pay

## 2022-10-19 NOTE — Progress Notes (Signed)
Remote ICD transmission.   

## 2022-10-22 ENCOUNTER — Other Ambulatory Visit (HOSPITAL_COMMUNITY): Payer: Self-pay

## 2022-10-28 IMAGING — CT CT ANGIO CHEST
2 of 7 series · 17 of 46 positions shown · IV contrast (agent unspecified)
Comparison: CT chest 04/13/2021

CLINICAL DATA: History of prostate and gastric cancer, complaining
of shortness of breath and left chest pain for 1 week

EXAM:
CT ANGIOGRAPHY CHEST WITH CONTRAST
TECHNIQUE: Multidetector CT imaging of the chest was performed using the
standard protocol during bolus administration of intravenous
contrast. Multiplanar CT image reconstructions and MIPs were
obtained to evaluate the vascular anatomy.

[Series 6: pe axial thins · axial · 0.71mm/px · z∈[+1111,+1392]mm · 14 of 325 slices shown]
[im 22/325  lung]
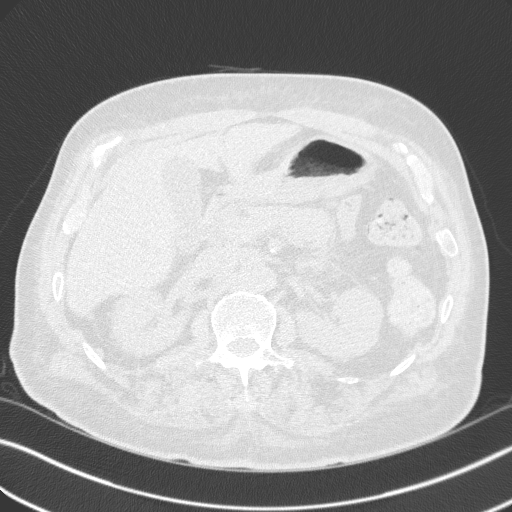
[im 44/325  soft-tissue]
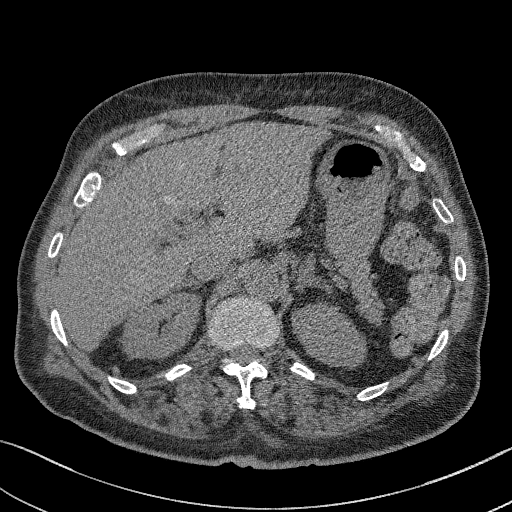
[im 65/325  lung]
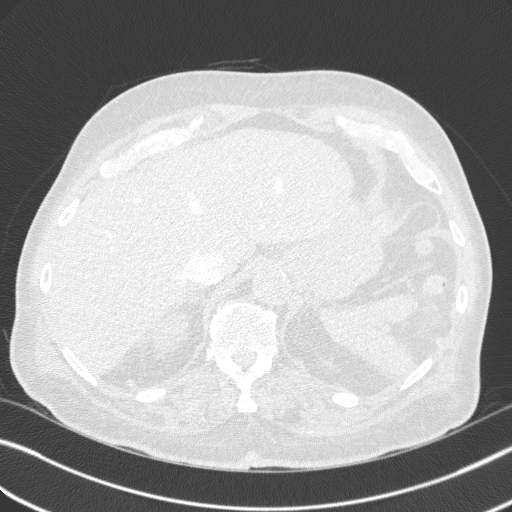
[im 87/325  soft-tissue]
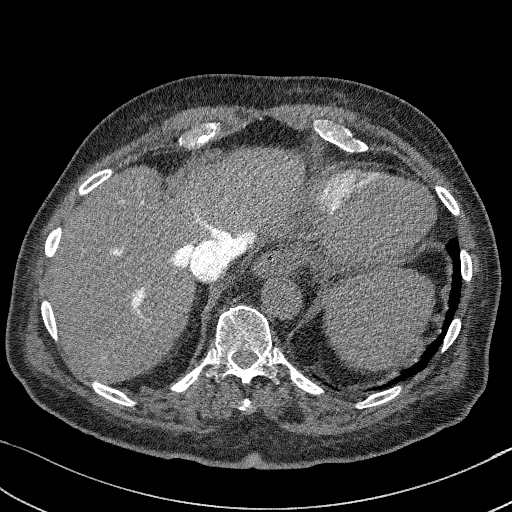
[im 109/325  lung]
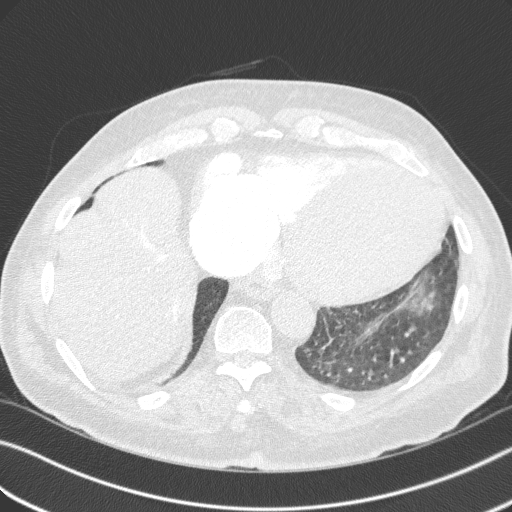
[im 130/325  soft-tissue]
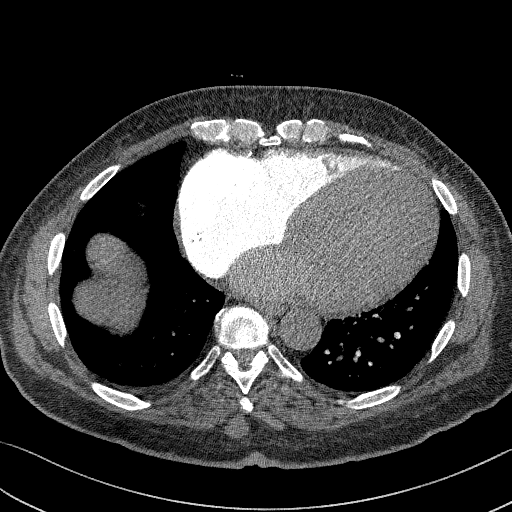
[im 152/325  lung]
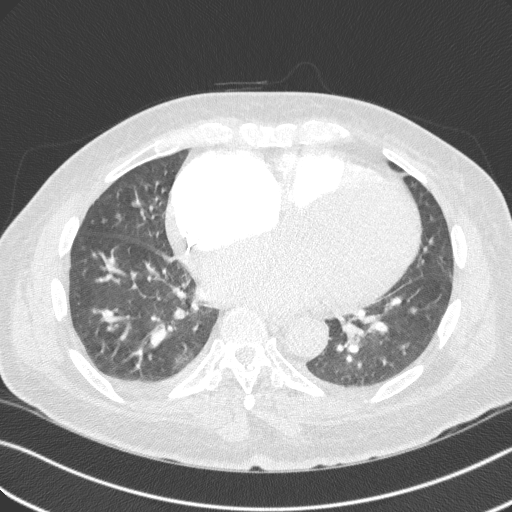
[im 173/325  soft-tissue]
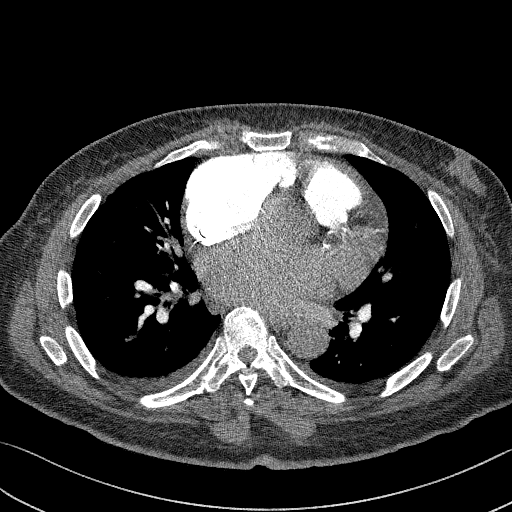
[im 195/325  lung]
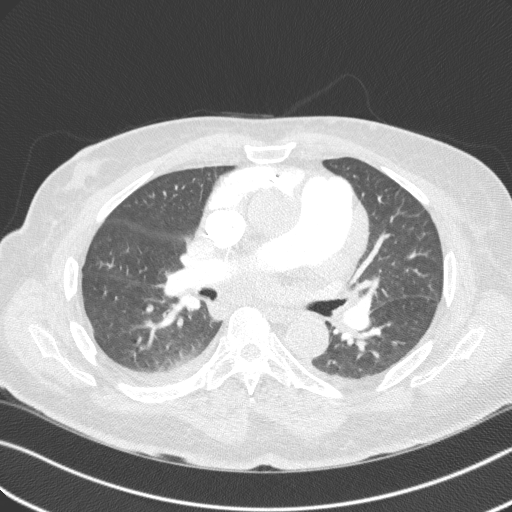
[im 217/325  soft-tissue]
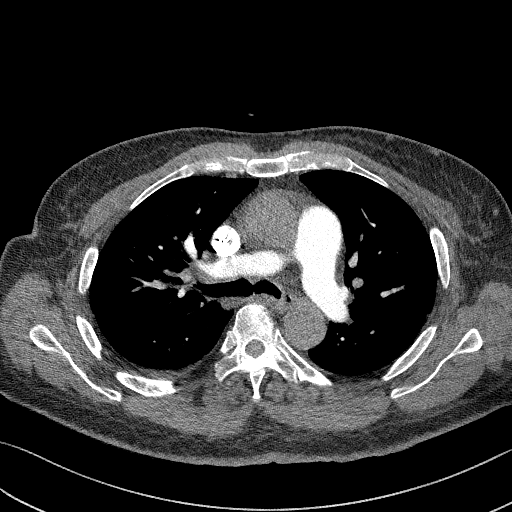
[im 238/325  lung]
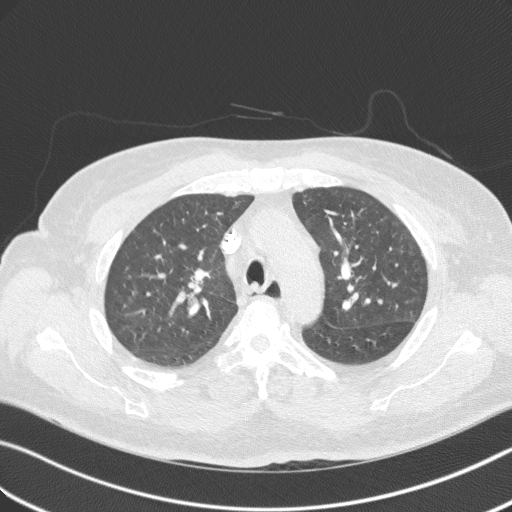
[im 260/325  soft-tissue]
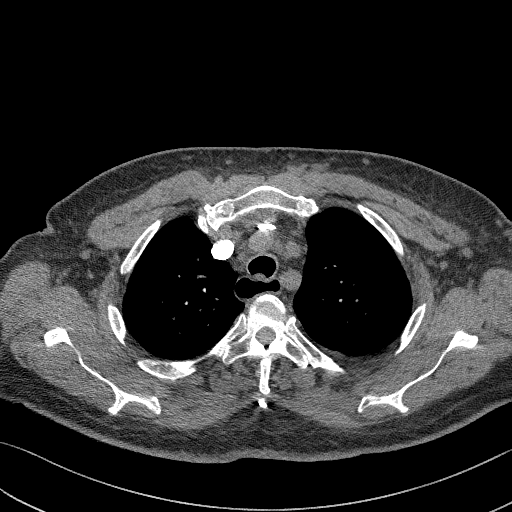
[im 281/325  lung]
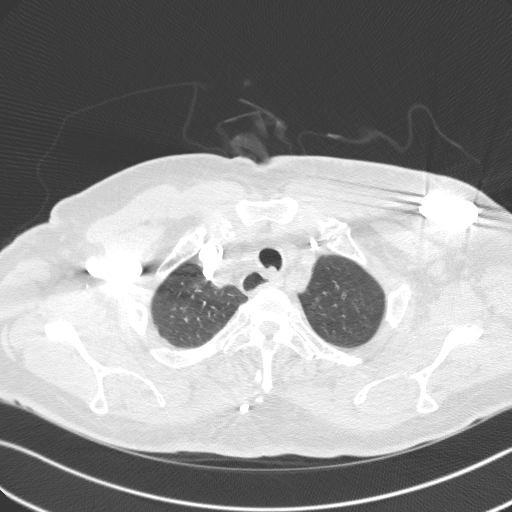
[im 303/325  soft-tissue]
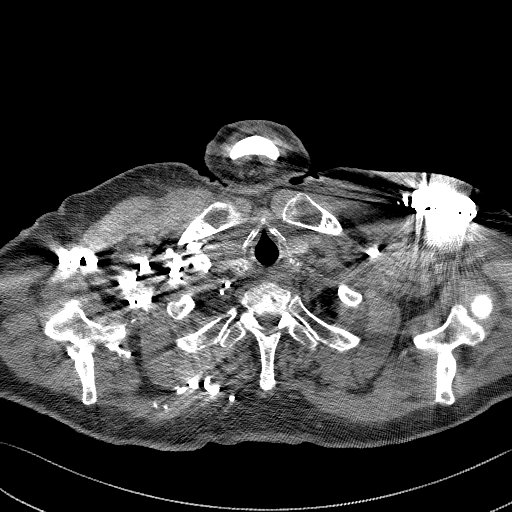

[Series 8: cor soft · coronal · 0.69mm/px · 3 of 144 slices shown]
[im 36/144  soft-tissue]
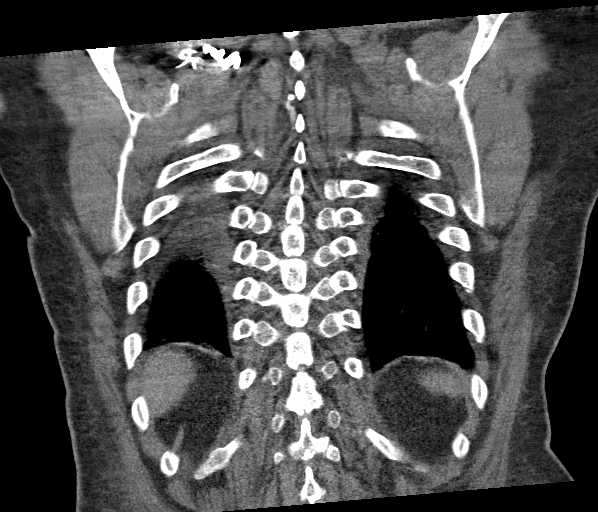
[im 72/144  soft-tissue]
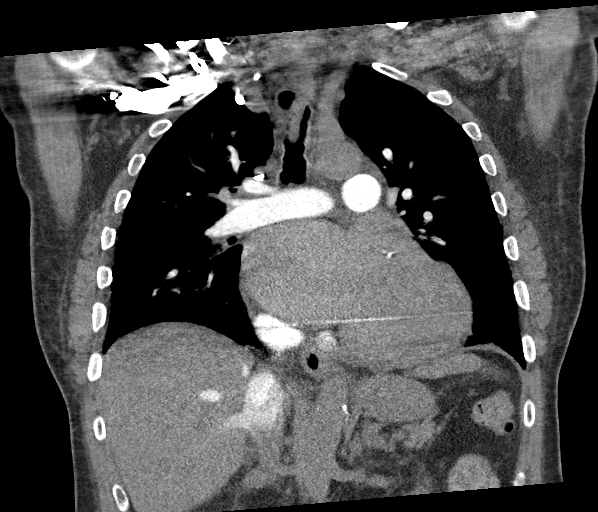
[im 108/144  soft-tissue]
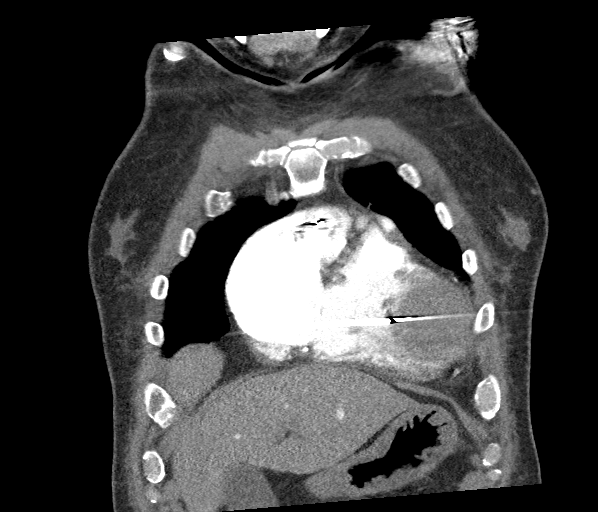

[17 of 46 positions shown; findings below may reference images not displayed]

RADIATION DOSE REDUCTION: This exam was performed according to the
departmental dose-optimization program which includes automated
exposure control, adjustment of the mA and/or kV according to
patient size and/or use of iterative reconstruction technique.

CONTRAST:  75mL OMNIPAQUE IOHEXOL 350 MG/ML SOLN
FINDINGS: Cardiovascular: There is adequate opacification of the pulmonary
arteries to the segmental level. There is no evidence of pulmonary
embolism. The main pulmonary artery is enlarged measuring up to
cm, in the right atrium is enlarged with reflux of contrast into the
hepatic veins and IVC, consistent with right heart dysfunction.
There is no pericardial effusion. There are coronary artery
calcifications. A left chest wall cardiac device is in place,
unchanged.

Mediastinum/Nodes: The imaged thyroid is grossly unremarkable. The
esophagus is grossly unremarkable. There is no mediastinal, hilar,
or axillary lymphadenopathy.

Lungs/Pleura: The trachea and central airways are patent

There is smooth interlobular septal thickening. There is a small
right pleural effusion, new since 04/13/2021. There is no focal
consolidation. There is no left effusion. There is no pneumothorax.
There are no suspicious nodules.

Upper Abdomen: A left adrenal adenoma is again seen. The imaged
portions of the upper abdominal viscera are otherwise unremarkable.

Musculoskeletal: There is bilateral gynecomastia. There is no acute
osseous abnormality or suspicious osseous lesion.

Review of the MIP images confirms the above findings.
IMPRESSION: 1. No evidence of pulmonary embolism.
2. Mild pulmonary interstitial edema and small right pleural
effusion, new since 04/13/2021.
[DATE]. Enlarged main pulmonary artery can be seen with pulmonary
hypertension. There is enlargement of the right atrium with reflux
of contrast into the hepatic veins and IVC consistent with right
heart dysfunction.
4. Coronary artery disease.

## 2022-10-29 ENCOUNTER — Encounter (HOSPITAL_COMMUNITY): Payer: Self-pay | Admitting: Emergency Medicine

## 2022-10-29 ENCOUNTER — Inpatient Hospital Stay (HOSPITAL_COMMUNITY)
Admission: EM | Admit: 2022-10-29 | Discharge: 2022-10-31 | DRG: 286 | Disposition: A | Discharge status: Home / Self Care | Payer: Medicare HMO | Attending: Cardiology | Admitting: Cardiology

## 2022-10-29 ENCOUNTER — Emergency Department (HOSPITAL_COMMUNITY): Payer: Medicare HMO

## 2022-10-29 ENCOUNTER — Other Ambulatory Visit: Payer: Self-pay

## 2022-10-29 DIAGNOSIS — I428 Other cardiomyopathies: Secondary | ICD-10-CM | POA: Diagnosis not present

## 2022-10-29 DIAGNOSIS — E119 Type 2 diabetes mellitus without complications: Secondary | ICD-10-CM | POA: Diagnosis not present

## 2022-10-29 DIAGNOSIS — Z801 Family history of malignant neoplasm of trachea, bronchus and lung: Secondary | ICD-10-CM | POA: Diagnosis not present

## 2022-10-29 DIAGNOSIS — Z833 Family history of diabetes mellitus: Secondary | ICD-10-CM | POA: Diagnosis not present

## 2022-10-29 DIAGNOSIS — Z7984 Long term (current) use of oral hypoglycemic drugs: Secondary | ICD-10-CM

## 2022-10-29 DIAGNOSIS — I2729 Other secondary pulmonary hypertension: Secondary | ICD-10-CM | POA: Diagnosis not present

## 2022-10-29 DIAGNOSIS — I2510 Atherosclerotic heart disease of native coronary artery without angina pectoris: Secondary | ICD-10-CM | POA: Diagnosis present

## 2022-10-29 DIAGNOSIS — Z8249 Family history of ischemic heart disease and other diseases of the circulatory system: Secondary | ICD-10-CM | POA: Diagnosis not present

## 2022-10-29 DIAGNOSIS — Q245 Malformation of coronary vessels: Secondary | ICD-10-CM | POA: Diagnosis not present

## 2022-10-29 DIAGNOSIS — Z950 Presence of cardiac pacemaker: Secondary | ICD-10-CM | POA: Diagnosis not present

## 2022-10-29 DIAGNOSIS — Z79899 Other long term (current) drug therapy: Secondary | ICD-10-CM | POA: Diagnosis not present

## 2022-10-29 DIAGNOSIS — I4891 Unspecified atrial fibrillation: Secondary | ICD-10-CM | POA: Diagnosis not present

## 2022-10-29 DIAGNOSIS — Z85028 Personal history of other malignant neoplasm of stomach: Secondary | ICD-10-CM | POA: Diagnosis not present

## 2022-10-29 DIAGNOSIS — I5023 Acute on chronic systolic (congestive) heart failure: Secondary | ICD-10-CM | POA: Diagnosis present

## 2022-10-29 DIAGNOSIS — Z7901 Long term (current) use of anticoagulants: Secondary | ICD-10-CM

## 2022-10-29 DIAGNOSIS — Z87892 Personal history of anaphylaxis: Secondary | ICD-10-CM

## 2022-10-29 DIAGNOSIS — I110 Hypertensive heart disease with heart failure: Principal | ICD-10-CM | POA: Diagnosis present

## 2022-10-29 DIAGNOSIS — Z882 Allergy status to sulfonamides status: Secondary | ICD-10-CM | POA: Diagnosis not present

## 2022-10-29 DIAGNOSIS — Z8546 Personal history of malignant neoplasm of prostate: Secondary | ICD-10-CM | POA: Diagnosis not present

## 2022-10-29 DIAGNOSIS — R079 Chest pain, unspecified: Secondary | ICD-10-CM | POA: Diagnosis not present

## 2022-10-29 DIAGNOSIS — I5043 Acute on chronic combined systolic (congestive) and diastolic (congestive) heart failure: Secondary | ICD-10-CM | POA: Diagnosis present

## 2022-10-29 DIAGNOSIS — Z96651 Presence of right artificial knee joint: Secondary | ICD-10-CM | POA: Diagnosis present

## 2022-10-29 DIAGNOSIS — Z95810 Presence of automatic (implantable) cardiac defibrillator: Secondary | ICD-10-CM | POA: Diagnosis not present

## 2022-10-29 DIAGNOSIS — R0609 Other forms of dyspnea: Principal | ICD-10-CM

## 2022-10-29 DIAGNOSIS — I4821 Permanent atrial fibrillation: Secondary | ICD-10-CM | POA: Diagnosis not present

## 2022-10-29 DIAGNOSIS — Z886 Allergy status to analgesic agent status: Secondary | ICD-10-CM

## 2022-10-29 DIAGNOSIS — R0602 Shortness of breath: Secondary | ICD-10-CM | POA: Diagnosis not present

## 2022-10-29 DIAGNOSIS — Z86010 Personal history of colonic polyps: Secondary | ICD-10-CM

## 2022-10-29 DIAGNOSIS — J811 Chronic pulmonary edema: Secondary | ICD-10-CM | POA: Diagnosis not present

## 2022-10-29 DIAGNOSIS — I447 Left bundle-branch block, unspecified: Secondary | ICD-10-CM | POA: Diagnosis present

## 2022-10-29 DIAGNOSIS — J90 Pleural effusion, not elsewhere classified: Secondary | ICD-10-CM | POA: Diagnosis not present

## 2022-10-29 LAB — CBC
HCT: 39.9 % (ref 39.0–52.0)
Hemoglobin: 12.6 g/dL — ABNORMAL LOW (ref 13.0–17.0)
MCH: 32.3 pg (ref 26.0–34.0)
MCHC: 31.6 g/dL (ref 30.0–36.0)
MCV: 102.3 fL — ABNORMAL HIGH (ref 80.0–100.0)
Platelets: 163 10*3/uL (ref 150–400)
RBC: 3.9 MIL/uL — ABNORMAL LOW (ref 4.22–5.81)
RDW: 14.1 % (ref 11.5–15.5)
WBC: 6.6 10*3/uL (ref 4.0–10.5)
nRBC: 0 % (ref 0.0–0.2)

## 2022-10-29 LAB — BRAIN NATRIURETIC PEPTIDE: B Natriuretic Peptide: 1605.5 pg/mL — ABNORMAL HIGH (ref 0.0–100.0)

## 2022-10-29 LAB — BASIC METABOLIC PANEL
Anion gap: 7 (ref 5–15)
BUN: 21 mg/dL (ref 8–23)
CO2: 22 mmol/L (ref 22–32)
Calcium: 8.7 mg/dL — ABNORMAL LOW (ref 8.9–10.3)
Chloride: 110 mmol/L (ref 98–111)
Creatinine, Ser: 1.24 mg/dL (ref 0.61–1.24)
GFR, Estimated: >60 mL/min (ref >60)
Glucose, Bld: 108 mg/dL — ABNORMAL HIGH (ref 70–99)
Potassium: 4 mmol/L (ref 3.5–5.1)
Sodium: 139 mmol/L (ref 135–145)

## 2022-10-29 MED ORDER — IOHEXOL 350 MG/ML SOLN
75.0000 mL | Freq: Once | INTRAVENOUS | Status: AC | PRN
  Administered 2022-10-29: 75 mL via INTRAVENOUS

## 2022-10-29 MED ORDER — ONDANSETRON HCL 4 MG/2ML IJ SOLN: 4.0000 mg | Freq: Four times a day (QID) | INTRAMUSCULAR | Status: DC | PRN

## 2022-10-29 MED ORDER — SACUBITRIL-VALSARTAN 24-26 MG PO TABS
1.0000 | ORAL_TABLET | Freq: Two times a day (BID) | ORAL | Status: DC
  Administered 2022-10-29 – 2022-10-31 (×4): 1 via ORAL
  Filled 2022-10-29 (×4): qty 1

## 2022-10-29 MED ORDER — SPIRONOLACTONE 25 MG PO TABS: 25.0000 mg | ORAL_TABLET | Freq: Every day | ORAL | Status: DC

## 2022-10-29 MED ORDER — ATORVASTATIN CALCIUM 40 MG PO TABS: 40.0000 mg | ORAL_TABLET | Freq: Every day | ORAL | Status: DC

## 2022-10-29 MED ORDER — SPIRONOLACTONE 25 MG PO TABS
25.0000 mg | ORAL_TABLET | Freq: Every day | ORAL | Status: DC
  Administered 2022-10-30 – 2022-10-31 (×2): 25 mg via ORAL
  Filled 2022-10-29 (×2): qty 1

## 2022-10-29 MED ORDER — INSULIN ASPART 100 UNIT/ML IJ SOLN
0.0000 [IU] | Freq: Three times a day (TID) | INTRAMUSCULAR | Status: DC
  Administered 2022-10-30: 1 [IU] via SUBCUTANEOUS

## 2022-10-29 MED ORDER — GABAPENTIN 100 MG PO CAPS
100.0000 mg | ORAL_CAPSULE | Freq: Two times a day (BID) | ORAL | Status: DC
  Administered 2022-10-29 – 2022-10-31 (×4): 100 mg via ORAL
  Filled 2022-10-29 (×4): qty 1

## 2022-10-29 MED ORDER — SODIUM CHLORIDE 0.9% FLUSH: 3.0000 mL | INTRAVENOUS | Status: DC | PRN

## 2022-10-29 MED ORDER — METOPROLOL SUCCINATE ER 25 MG PO TB24
25.0000 mg | ORAL_TABLET | Freq: Every day | ORAL | Status: DC
  Administered 2022-10-29 – 2022-10-31 (×3): 25 mg via ORAL
  Filled 2022-10-29 (×3): qty 1

## 2022-10-29 MED ORDER — PANTOPRAZOLE SODIUM 40 MG PO TBEC
40.0000 mg | DELAYED_RELEASE_TABLET | Freq: Two times a day (BID) | ORAL | Status: DC
  Administered 2022-10-29 – 2022-10-31 (×4): 40 mg via ORAL
  Filled 2022-10-29 (×4): qty 1

## 2022-10-29 MED ORDER — LORATADINE 10 MG PO TABS: 10.0000 mg | ORAL_TABLET | Freq: Every day | ORAL | Status: DC

## 2022-10-29 MED ORDER — LORATADINE 10 MG PO TABS
10.0000 mg | ORAL_TABLET | Freq: Every day | ORAL | Status: DC
  Administered 2022-10-30 – 2022-10-31 (×2): 10 mg via ORAL
  Filled 2022-10-29 (×2): qty 1

## 2022-10-29 MED ORDER — SODIUM CHLORIDE 0.9% FLUSH
3.0000 mL | Freq: Two times a day (BID) | INTRAVENOUS | Status: DC
  Administered 2022-10-29 – 2022-10-31 (×4): 3 mL via INTRAVENOUS

## 2022-10-29 MED ORDER — ATORVASTATIN CALCIUM 40 MG PO TABS
40.0000 mg | ORAL_TABLET | Freq: Every day | ORAL | Status: DC
  Administered 2022-10-30 – 2022-10-31 (×2): 40 mg via ORAL
  Filled 2022-10-29 (×2): qty 1

## 2022-10-29 MED ORDER — ACETAMINOPHEN 325 MG PO TABS: 650.0000 mg | ORAL_TABLET | ORAL | Status: DC | PRN

## 2022-10-29 MED ORDER — SOTALOL HCL 120 MG PO TABS
120.0000 mg | ORAL_TABLET | Freq: Two times a day (BID) | ORAL | Status: DC
  Administered 2022-10-29 – 2022-10-30 (×2): 120 mg via ORAL
  Filled 2022-10-29 (×2): qty 1

## 2022-10-29 MED ORDER — SODIUM CHLORIDE 0.9 % IV SOLN: 250.0000 mL | INTRAVENOUS | Status: DC | PRN

## 2022-10-29 MED ORDER — PNEUMOCOCCAL 20-VAL CONJ VACC 0.5 ML IM SUSY
0.5000 mL | PREFILLED_SYRINGE | INTRAMUSCULAR | Status: DC
  Filled 2022-10-29 (×2): qty 0.5

## 2022-10-29 MED ORDER — FUROSEMIDE 10 MG/ML IJ SOLN
80.0000 mg | Freq: Two times a day (BID) | INTRAMUSCULAR | Status: DC
  Administered 2022-10-29: 80 mg via INTRAVENOUS
  Filled 2022-10-29 (×2): qty 8

## 2022-10-29 MED ORDER — POTASSIUM CHLORIDE 20 MEQ PO PACK
40.0000 meq | PACK | Freq: Two times a day (BID) | ORAL | Status: DC
  Administered 2022-10-29: 40 meq via ORAL
  Filled 2022-10-29 (×2): qty 2

## 2022-10-29 MED ORDER — INFLUENZA VAC A&B SURF ANT ADJ 0.5 ML IM SUSY
0.5000 mL | PREFILLED_SYRINGE | INTRAMUSCULAR | Status: DC
  Filled 2022-10-29 (×2): qty 0.5

## 2022-10-29 NOTE — ED Provider Notes (Signed)
  Physical Exam  BP 119/85   Pulse 66   Temp 97.6 F (36.4 C) (Oral)   Resp 12   SpO2 92%   Physical Exam  Procedures  Procedures  ED Course / MDM    Medical Decision Making Amount and/or Complexity of Data Reviewed Labs: ordered. Radiology: ordered.  Risk Prescription drug management.   Pt with CHF hx comes in with cc of exertional shob.  Pt has hx of persistent AF, advanced CHF. Exertional shob, but otherwise stable. Pt not comfortable going home.We will consult Cardiology.       Derwood Kaplan, MD 10/29/22 [phone]

## 2022-10-29 NOTE — ED Notes (Signed)
Pt provided with dinner tray.

## 2022-10-29 NOTE — ED Notes (Signed)
Pt ambulated on pulse ox. SPO2 maintained at 96%, HR 98. Pt not endorsing SOB, or dizziness.

## 2022-10-29 NOTE — ED Provider Notes (Signed)
[place] [facility]GENCY DEPARTMENT AT [facility] Provider Note   CSN: [id number] Arrival date & time: 10/29/22  1024     History  No chief complaint on file.   Jon Williams is a 72 y.o. male.  Patient with exertional shortness of breath and needing to prop up more at night.  Associated with dry cough.  Patient not any distress currently oxygen saturations at rest are 100%.  No fevers.  No upper respiratory symptoms.  Respiratory rate has been up a little bit sometimes 18 sometimes 30 to blood pressure 118/77.  Patient is followed by cardiology.  He takes 20 of Lasix every other day.  He is known to have combined systolic and diastolic congestive heart failure.  Patient has had some bilateral leg swelling as well.  Patient is on Xarelto as well.  He has been taking that.  He has a history of atrial fibs.  Patient's past medical history significant for as mentioned the chronic combined systolic diastolic congestive heart failure left bundle branch block nonischemic cardiomyopathy.  Coronary artery disease involving native coronary artery without angina pectoris.  Patient also has had no chest pain.  Secondary pulmonary hypertension.  Patient has an AICD in place.  History of prostate cancer in 2019 history of diverticulosis history of atrial fibrillation and atrial flutter.  History of cardiomegaly and diabetes without complications history of gastric cancer history of the hypertension.  Surgical history significant for upper endoscopy May 2024.  With a biopsy.  Patient is never used tobacco products.       Home Medications Prior to Admission medications   Medication Sig Start Date End Date Taking? Authorizing Provider  Accu-Chek Softclix Lancets lancets Use to check blood sugar daily 07/20/22   Renford Dills, MD  atorvastatin (LIPITOR) 40 MG tablet Take 1 tablet (40 mg total) by mouth daily. 05/15/22   Marinus Maw, MD  Biotin 1000 MCG tablet Take 1,000 mcg by mouth daily with  breakfast.    [provider]  cholecalciferol (VITAMIN D3) 25 MCG (1000 UT) tablet Take 1,000 Units by mouth daily with breakfast.    [provider]  empagliflozin (JARDIANCE) 10 MG TABS tablet Take 1 tablet (10 mg total) by mouth daily before breakfast. Pt requesting Jardiance instead of Farxiga 09/21/22   [name], Deloris Ping, MD  furosemide (LASIX) 20 MG tablet Take 1 tablet (20 mg total) by mouth every other day. 09/06/22   [name], Deloris Ping, MD  gabapentin (NEURONTIN) 100 MG capsule Take 1 capsule (100 mg total) by mouth 2 (two) times daily. 09/21/22   Carlean Jews, NP  ketoconazole (NIZORAL) 2 % cream Apply 1 Application topically 2 (two) times daily. 09/26/22     loratadine (CLARITIN) 10 MG tablet Take 10 mg by mouth daily.    [provider]  metFORMIN (GLUCOPHAGE-XR) 500 MG 24 hr tablet Take 500 mg by mouth every evening. 06/13/20   [provider]  metFORMIN (GLUCOPHAGE-XR) 500 MG 24 hr tablet Take 1 tablet (500 mg total) by mouth with evening meal 09/21/22     metoprolol succinate (TOPROL-XL) 25 MG 24 hr tablet Take 1 tablet (25 mg total) by mouth daily. 05/23/22   Swinyer, Zachary George, NP  multivitamin (ONE-A-DAY MEN'S) TABS tablet Take 1 tablet by mouth daily with breakfast.    [provider]  Naftifine HCl (NAFTIN) 2 % GEL Apply 1 application  topically every Monday, Wednesday, and Friday.    [provider]  pantoprazole (PROTONIX)  40 MG tablet Take 1 tablet (40 mg total) by mouth 2 (two) times daily. 12/21/21   Glade Lloyd, MD  potassium chloride (KLOR-CON) 10 MEQ tablet Take 2 tablets (20 mEq total) by mouth every other day. 09/06/22   [name], Deloris Ping, MD  sacubitril-valsartan (ENTRESTO) 24-26 MG Take 1 tablet by mouth 2 (two) times daily. 12/13/21   Marinus Maw, MD  sotalol (BETAPACE) 80 MG tablet Take 1.5 tablets (120 mg total) by mouth in the morning and at bedtime. 09/10/22   Marinus Maw, MD  spironolactone (ALDACTONE) 25 MG  tablet Take 1 tablet (25 mg total) by mouth daily. 09/10/22   Marinus Maw, MD  Tavaborole 5 % SOLN Apply 1 application  topically daily. Nail on left hand    [provider]  XARELTO 20 MG TABS tablet TAKE 1 TABLET BY MOUTH EVERY DAY WITH DINNER 09/03/22   Marinus Maw, MD  prochlorperazine (COMPAZINE) 10 MG tablet Take 1 tablet (10 mg total) by mouth every 6 (six) hours as needed (Nausea or vomiting). 02/15/21 05/12/21  Malachy Mood, MD      Allergies    Aspirin and Sulfa antibiotics    Review of Systems   Review of Systems  Constitutional:  Negative for chills and fever.  HENT:  Negative for ear pain and sore throat.   Eyes:  Negative for pain and visual disturbance.  Respiratory:  Positive for shortness of breath. Negative for cough.   Cardiovascular:  Positive for leg swelling. Negative for chest pain and palpitations.  Gastrointestinal:  Negative for abdominal pain and vomiting.  Genitourinary:  Negative for dysuria and hematuria.  Musculoskeletal:  Negative for arthralgias and back pain.  Skin:  Negative for color change and rash.  Neurological:  Negative for seizures and syncope.  All other systems reviewed and are negative.   Physical Exam Updated Vital Signs BP 119/85   Pulse 66   Temp 97.6 F (36.4 C) (Oral)   Resp 12   SpO2 92%  Physical Exam Vitals and nursing note reviewed.  Constitutional:      General: He is not in acute distress.    Appearance: Normal appearance. He is well-developed.  HENT:     Head: Normocephalic and atraumatic.     Mouth/Throat:     Mouth: Mucous membranes are moist.  Eyes:     Conjunctiva/sclera: Conjunctivae normal.  Cardiovascular:     Rate and Rhythm: Normal rate and regular rhythm.     Heart sounds: No murmur heard. Pulmonary:     Effort: Pulmonary effort is normal. No respiratory distress.     Breath sounds: Rales present. No wheezing or rhonchi.  Abdominal:     Palpations: Abdomen is soft.     Tenderness: There is  no abdominal tenderness.  Musculoskeletal:        General: No swelling.     Cervical back: Neck supple.     Right lower leg: Edema present.     Left lower leg: Edema present.  Skin:    General: Skin is warm and dry.     Capillary Refill: Capillary refill takes less than 2 seconds.  Neurological:     General: No focal deficit present.     Mental Status: He is alert and oriented to person, place, and time.  Psychiatric:        Mood and Affect: Mood normal.     ED Results / Procedures / Treatments   Labs (all labs ordered are listed,  but only abnormal results are displayed) Labs Reviewed  BASIC METABOLIC PANEL - Abnormal; Notable for the following components:      Result Value   Glucose, Bld 108 (*)    Calcium 8.7 (*)    All other components within normal limits  CBC - Abnormal; Notable for the following components:   RBC 3.90 (*)    Hemoglobin 12.6 (*)    MCV 102.3 (*)    All other components within normal limits  BRAIN NATRIURETIC PEPTIDE    EKG EKG Interpretation Date/Time:  Monday October 29 2022 10:43:37 EDT Ventricular Rate:  64 PR Interval:  157 QRS Duration:  203 QT Interval:  526 QTC Calculation: 543 R Axis:   -84  Text Interpretation: VENTRICULAR PACED RHYTHM Left ventricular hypertrophy Borderline T abnormalities, lateral leads Confirmed by Vanetta Mulders [phone]) on 10/29/2022 10:45:54 AM  Radiology DG Chest 2 View  Result Date: 10/29/2022 CLINICAL DATA:  Shortness of breath.  Chest pain. EXAM: CHEST - 2 VIEW COMPARISON:  12/25/2021. FINDINGS: Minimal blunting of right lateral costophrenic angle has improved since the prior study and may represent pleural thickening versus trace pleural effusion. Bilateral lung fields are clear. Left costophrenic angle is clear. Stable moderately enlarged cardio-mediastinal silhouette. There is a left sided 2-lead pacemaker. No acute osseous abnormalities. The soft tissues are within normal limits. Right-sided CT  Port-A-Cath is seen with its tip overlying the upper portion of superior vena cava, unchanged. IMPRESSION: 1. Minimal blunting of the right lateral costophrenic angle, improved since the prior study. Electronically Signed   By: Jules Schick M.D.   On: 10/29/2022 12:58    Procedures Procedures    Medications Ordered in ED Medications - No data to display  ED Course/ Medical Decision Making/ A&P                                 Medical Decision Making Amount and/or Complexity of Data Reviewed Labs: ordered. Radiology: ordered.   Patient's vital signs at rest very reassuring.  Patient very concerned about the shortness of breath with exertion.  Clinically suspect that this may be a CHF exacerbation.  Patient was offered just increasing his Lasix to every day versus every other day.  He is already on a blood thinner.  But he is very concerned.  So we will get BNP order CT angio to look at the lungs more carefully at the same time rule out pulmonary embolus but likely said he is on anticoagulation therapy.   Final Clinical Impression(s) / ED Diagnoses Final diagnoses:  Dyspnea on exertion    Rx / DC Orders ED Discharge Orders     None         Vanetta Mulders, MD 10/29/22 1437

## 2022-10-29 NOTE — ED Triage Notes (Signed)
Pt came in POV for SOB x 2 days with dry cough.    Pt is not in distress at this time.

## 2022-10-29 NOTE — H&P (Addendum)
Advanced Heart Failure Team History and Physical Note   PCP:  Renford Dills, MD  PCP-Cardiology: Kristeen Miss, MD   EP: Dr Ladona Ridgel  Oncology: Dr Mosetta Putt  Reason for Admission: A/C Combined Systolic/Diastolic Heart Failure    HPI:   Jon Williams is a 72 year old with a history of chronic systolic/diastolic heart failure, St Jude ICD, persistent atrial fibrillation, PAF, CAD, DMII, LBBB, HTN, gastric cancer  2022, and prostate cancer 2019.   Cath 2018 Mild -Mod  non obstructive CAD. Fick CI 1.9   Echo 10/22 EF 20-25%, mod-severe Jon, mod TR, RV nl  Echo 2/23 EF 20-25%, mild Jon, RV not well visualized  Admitted 09/2021 with A/C HF. Diuresed with IV lasix and GDMT adjusted. ECHO EF 20-25%, RV mildly reduced, LA severely dilated, Mod-Severe Jon.   Had follow up with Dr Mosetta Putt 09/2022. CT scan no evidence of recurrent cancer. Plan for repeat CT in 8 months.   Saw Dr Elease Hashimoto in July. Lasix was stopped. A week later lasix 20 mg every other day was restarted.   Presented to the ED with increased shortness of breath and orthopnea. Symptoms started a few days ago. Taking all medications. BNP 1605, K4, Creatinine 1.24, and Hgb 12.6. CTA - negative PE. CXR no acute findings.    Review of Systems: [y] = yes, [ ]  = no   General: Weight gain [ ] ; Weight loss [ ] ; Anorexia [ ] ; Fatigue [ ] ; Fever [ ] ; Chills [ ] ; Weakness [ ]   Cardiac: Chest pain/pressure [ ] ; Resting SOB [ ] ; Exertional SOB [Y ]; Orthopnea [ Y]; Pedal Edema [ ] ; Palpitations [ ] ; Syncope [ ] ; Presyncope [ ] ; Paroxysmal nocturnal dyspnea[ ]   Pulmonary: Cough [ ] ; Wheezing[ ] ; Hemoptysis[ ] ; Sputum [ ] ; Snoring [ ]   GI: Vomiting[ ] ; Dysphagia[ ] ; Melena[ ] ; Hematochezia [ ] ; Heartburn[ ] ; Abdominal pain [ ] ; Constipation [ ] ; Diarrhea [ ] ; BRBPR [ ]   GU: Hematuria[ ] ; Dysuria [ ] ; Nocturia[ ]   Vascular: Pain in legs with walking [ ] ; Pain in feet with lying flat [ ] ; Non-healing sores [ ] ; Stroke [ ] ; TIA [ ] ; Slurred speech [ ] ;  Neuro:  Headaches[ ] ; Vertigo[ ] ; Seizures[ ] ; Paresthesias[ ] ;Blurred vision [ ] ; Diplopia [ ] ; Vision changes [ ]   Ortho/Skin: Arthritis [ ] ; Joint pain [Y ]; Muscle pain [ ] ; Joint swelling [ ] ; Back Pain [ Y]; Rash [ ]   Psych: Depression[ ] ; Anxiety[ ]   Heme: Bleeding problems [ ] ; Clotting disorders [ ] ; Anemia [ ]   Endocrine: Diabetes [ Y]; Thyroid dysfunction[ ]    Home Medications Prior to Admission medications   Medication Sig Start Date End Date Taking? Authorizing Provider  Accu-Chek Softclix Lancets lancets Use to check blood sugar daily 07/20/22   Renford Dills, MD  atorvastatin (LIPITOR) 40 MG tablet Take 1 tablet (40 mg total) by mouth daily. 05/15/22   Marinus Maw, MD  Biotin 1000 MCG tablet Take 1,000 mcg by mouth daily with breakfast.    [provider]  cholecalciferol (VITAMIN D3) 25 MCG (1000 UT) tablet Take 1,000 Units by mouth daily with breakfast.    [provider]  empagliflozin (JARDIANCE) 10 MG TABS tablet Take 1 tablet (10 mg total) by mouth daily before breakfast. Pt requesting Jardiance instead of Farxiga 09/21/22   [name], Deloris Ping, MD  furosemide (LASIX) 20 MG tablet Take 1 tablet (20 mg total) by mouth every other day. 09/06/22   [name], Loistine Chance  J, MD  gabapentin (NEURONTIN) 100 MG capsule Take 1 capsule (100 mg total) by mouth 2 (two) times daily. 09/21/22   Carlean Jews, NP  ketoconazole (NIZORAL) 2 % cream Apply 1 Application topically 2 (two) times daily. 09/26/22     loratadine (CLARITIN) 10 MG tablet Take 10 mg by mouth daily.    [provider]  metFORMIN (GLUCOPHAGE-XR) 500 MG 24 hr tablet Take 500 mg by mouth every evening. 06/13/20   [provider]  metFORMIN (GLUCOPHAGE-XR) 500 MG 24 hr tablet Take 1 tablet (500 mg total) by mouth with evening meal 09/21/22     metoprolol succinate (TOPROL-XL) 25 MG 24 hr tablet Take 1 tablet (25 mg total) by mouth daily. 05/23/22   Swinyer, Zachary George, NP  multivitamin (ONE-A-DAY MEN'S) TABS  tablet Take 1 tablet by mouth daily with breakfast.    [provider]  Naftifine HCl (NAFTIN) 2 % GEL Apply 1 application  topically every Monday, Wednesday, and Friday.    [provider]  pantoprazole (PROTONIX) 40 MG tablet Take 1 tablet (40 mg total) by mouth 2 (two) times daily. 12/21/21   Glade Lloyd, MD  potassium chloride (KLOR-CON) 10 MEQ tablet Take 2 tablets (20 mEq total) by mouth every other day. 09/06/22   [name], Deloris Ping, MD  sacubitril-valsartan (ENTRESTO) 24-26 MG Take 1 tablet by mouth 2 (two) times daily. 12/13/21   Marinus Maw, MD  sotalol (BETAPACE) 80 MG tablet Take 1.5 tablets (120 mg total) by mouth in the morning and at bedtime. 09/10/22   Marinus Maw, MD  spironolactone (ALDACTONE) 25 MG tablet Take 1 tablet (25 mg total) by mouth daily. 09/10/22   Marinus Maw, MD  Tavaborole 5 % SOLN Apply 1 application  topically daily. Nail on left hand    [provider]  XARELTO 20 MG TABS tablet TAKE 1 TABLET BY MOUTH EVERY DAY WITH DINNER 09/03/22   Marinus Maw, MD  prochlorperazine (COMPAZINE) 10 MG tablet Take 1 tablet (10 mg total) by mouth every 6 (six) hours as needed (Nausea or vomiting). 02/15/21 05/12/21  Malachy Mood, MD    Past Medical History: Past Medical History:  Diagnosis Date   AICD (automatic cardioverter/defibrillator) present 05/25/2016   biv icd   Bunion, right    Chronic combined systolic and diastolic CHF (congestive heart failure) (HCC)    Echo 1/18: Mild conc LVH, EF 15-20, severe diff HK, inf and inf-septal AK, Gr 3 DD, mild to mod Jon, severe LAE, mod reduced RVSF, mod RAE, mild TR, PASP 50   Coronary artery disease involving native coronary artery without angina pectoris 04/17/2016   LHC 1/18: pLCx 30, mLCx 20, mRCA 40, dRCA 20, LVEDP 23, mean RA 8, PA 42/20, PCWP 17   Diabetes mellitus without complication (HCC)    Gastric cancer (HCC)    History of atrial fibrillation    History of atrial flutter    History of  cardiomegaly 06/07/2016   Noted on CXR   History of colon polyps 06/28/2017   Noted on colonoscopy   Hypertension    LBBB (left bundle branch block)    Nausea vomiting and diarrhea 04/04/2021   NICM (nonischemic cardiomyopathy) (HCC)    Echo 1/18:  Mild conc LVH, EF 15-20, severe diff HK, inf and inf-septal AK, Gr 3 DD, mild to mod Jon, severe LAE, mod reduced RVSF, mod RAE, mild TR, PASP 50   Other secondary pulmonary hypertension (HCC) 04/17/2016   Prostate  cancer (HCC) 2019   Sigmoid diverticulosis 06/28/2017   Noted on colonoscopy    Past Surgical History: Past Surgical History:  Procedure Laterality Date   BIOPSY  12/01/2020   Procedure: BIOPSY;  Surgeon: Jeani Hawking, MD;  Location: [facility] ENDOSCOPY;  Service: Endoscopy;;   BIOPSY  06/09/2021   Procedure: BIOPSY;  Surgeon: Jeani Hawking, MD;  Location: [facility] ENDOSCOPY;  Service: Gastroenterology;;   BIOPSY  06/29/2022   Procedure: BIOPSY;  Surgeon: Jeani Hawking, MD;  Location: [facility] ENDOSCOPY;  Service: Gastroenterology;;   BIV ICD INSERTION CRT-D N/A 05/25/2016   Procedure: BiV ICD Insertion CRT-D;  Surgeon: Marinus Maw, MD;  Location[facility] INVASIVE CV LAB;  Service: Cardiovascular;  Laterality: N/A;   CARDIAC CATHETERIZATION N/A 03/02/2016   Procedure: Right/Left Heart Cath and Coronary Angiography;  Surgeon: Yvonne Kendall, MD;  Location: [facility] INVASIVE CV LAB;  Service: Cardiovascular;  Laterality: N/A;   CARDIOVERSION N/A 07/17/2016   Procedure: Cardioversion;  Surgeon: Marinus Maw, MD;  Location: [facility] INVASIVE CV LAB;  Service: Cardiovascular;  Laterality: N/A;   COLONOSCOPY WITH PROPOFOL N/A 06/28/2017   Procedure: COLONOSCOPY WITH PROPOFOL;  Surgeon: Jeani Hawking, MD;  Location: [facility] ENDOSCOPY;  Service: Endoscopy;  Laterality: N/A;   colonscopy  2009   ESOPHAGOGASTRODUODENOSCOPY N/A 12/01/2020   Procedure: ESOPHAGOGASTRODUODENOSCOPY (EGD);  Surgeon: Jeani Hawking, MD;  Location: [facility] ENDOSCOPY;  Service: Endoscopy;  Laterality: N/A;   IDA/guaiac positive stools   ESOPHAGOGASTRODUODENOSCOPY (EGD) WITH PROPOFOL N/A 12/16/2020   Procedure: ESOPHAGOGASTRODUODENOSCOPY (EGD) WITH PROPOFOL;  Surgeon: Jeani Hawking, MD;  Location: [facility] ENDOSCOPY;  Service: Endoscopy;  Laterality: N/A;   ESOPHAGOGASTRODUODENOSCOPY (EGD) WITH PROPOFOL N/A 06/09/2021   Procedure: ESOPHAGOGASTRODUODENOSCOPY (EGD) WITH PROPOFOL;  Surgeon: Jeani Hawking, MD;  Location: [facility] ENDOSCOPY;  Service: Gastroenterology;  Laterality: N/A;   ESOPHAGOGASTRODUODENOSCOPY (EGD) WITH PROPOFOL N/A 06/29/2022   Procedure: ESOPHAGOGASTRODUODENOSCOPY (EGD) WITH PROPOFOL;  Surgeon: Jeani Hawking, MD;  Location: [facility] ENDOSCOPY;  Service: Gastroenterology;  Laterality: N/A;   GOLD SEED IMPLANT N/A 12/24/2017   Procedure: GOLD SEED IMPLANT, TRANSERINEAL;  Surgeon: Jerilee Field, MD;  Location: [facility] ORS;  Service: Urology;  Laterality: N/A;   INSERT / REPLACE / REMOVE PACEMAKER     LEAD REVISION  10/10/2018   LEAD REVISION/REPAIR N/A 10/10/2018   Procedure: LEAD REVISION/REPAIR;  Surgeon: Marinus Maw, MD;  Location: [facility] INVASIVE CV LAB;  Service: Cardiovascular;  Laterality: N/A;   POLYPECTOMY  06/28/2017   Procedure: POLYPECTOMY;  Surgeon: Jeani Hawking, MD;  Location: [facility] ENDOSCOPY;  Service: Endoscopy;;  ascending and descending colon polyp   PORTACATH PLACEMENT Right 12/23/2020   Procedure: INSERTION PORT-A-CATH;  Surgeon: Fritzi Mandes, MD;  Location: [facility] OR;  Service: General;  Laterality: Right;   PROSTATE BIOPSY  02/20/2017   SPACE OAR INSTILLATION N/A 12/24/2017   Procedure: SPACE OAR INSTILLATION;  Surgeon: Jerilee Field, MD;  Location: [facility] ORS;  Service: Urology;  Laterality: N/A;   TOTAL KNEE ARTHROPLASTY Right 08/16/2017   Procedure: RIGHT TOTAL KNEE ARTHROPLASTY;  Surgeon: Frederico Hamman, MD;  Location: [facility] OR;  Service: Orthopedics;  Laterality: Right;   UPPER ESOPHAGEAL ENDOSCOPIC ULTRASOUND (EUS) N/A 12/16/2020   Procedure: UPPER ESOPHAGEAL ENDOSCOPIC ULTRASOUND  (EUS);  Surgeon: Jeani Hawking, MD;  Location: [facility] ENDOSCOPY;  Service: Endoscopy;  Laterality: N/A;    Family History:  Family History  Problem Relation Age of Onset   Hypertension Mother    Heart disease Mother    Diabetes Mother    Diabetes Father    Hypertension Father  Cancer Father        lung cancer   Healthy Sister    Heart attack Brother    Heart disease Brother 76       + tobacco   Healthy Brother     Social History: Social History   Socioeconomic History   Marital status: Married    Spouse name: Not on file   Number of children: 2   Years of education: Not on file   Highest education level: Not on file  Occupational History   Occupation: retired  Tobacco Use   Smoking status: Never   Smokeless tobacco: Never  Vaping Use   Vaping status: Never Used  Substance and Sexual Activity   Alcohol use: No   Drug use: No   Sexual activity: Not Currently  Other Topics Concern   Not on file  Social History Narrative   Retired Location manager. Married to Mrs. Jon Williams. Daughter, Jon Williams, lives in [place]. Son, Jon Williams, lives in [place].   Social Determinants of Health   Financial Resource Strain: Low Risk  (12/26/2021)   Overall Financial Resource Strain (CARDIA)    Difficulty of Paying Living Expenses: Not very hard  Food Insecurity: No Food Insecurity (12/25/2021)   Hunger Vital Sign    Worried About Running Out of Food in the Last Year: Never true    Ran Out of Food in the Last Year: Never true  Transportation Needs: No Transportation Needs (12/26/2021)   PRAPARE - Administrator, Civil Service (Medical): No    Lack of Transportation (Non-Medical): No  Physical Activity: Insufficiently Active (09/26/2021)   Exercise Vital Sign    Days of Exercise per Week: 7 days    Minutes of Exercise per Session: 10 min  Stress: No Stress Concern Present (09/26/2021)   [employer] of Occupational Health - Occupational Stress Questionnaire     Feeling of Stress : Not at all  Social Connections: Not on file    Allergies:  Allergies  Allergen Reactions   Aspirin Anaphylaxis and Hives   Sulfa Antibiotics Anaphylaxis, Hives, Swelling and Other (See Comments)    Swollen lips    Objective:    Vital Signs:   Temp:  [97.6 F (36.4 C)-97.7 F (36.5 C)] 97.6 F (36.4 C) (09/16 1325) Pulse Rate:  [48-66] 66 (09/16 1415) Resp:  [12-34] 12 (09/16 1415) BP: (111-119)/(77-85) 119/85 (09/16 1415) SpO2:  [92 %-100 %] 92 % (09/16 1415)   There were no vitals filed for this visit.   Physical Exam     General:  . No respiratory difficulty HEENT: Normal Neck: Supple. JVP to jaw  Carotids 2+ bilat; no bruits. No lymphadenopathy or thyromegaly appreciated. Cor: PMI nondisplaced. Irregular rate & rhythm. No rubs, gallops or murmurs. + ICD + Porta cath R Lungs: Clear Abdomen: Soft, nontender, nondistended. No hepatosplenomegaly. No bruits or masses. Good bowel sounds. Extremities: cool, No cyanosis, clubbing, rash, edema Neuro: Alert & oriented x 3, cranial nerves grossly intact. moves all 4 extremities w/o difficulty. Affect pleasant.   Telemetry  Not on the monitor  EKG  V paced irregular   Labs     Basic Metabolic Panel: Recent Labs  Lab 10/29/22 1102  NA 139  K 4.0  CL 110  CO2 22  GLUCOSE 108*  BUN 21  CREATININE 1.24  CALCIUM 8.7*    Liver Function Tests: No results for input(s): "AST", "ALT", "ALKPHOS", "BILITOT", "PROT", "ALBUMIN" in the last 168 hours. No results for  input(s): "LIPASE", "AMYLASE" in the last 168 hours. No results for input(s): "AMMONIA" in the last 168 hours.  CBC: Recent Labs  Lab 10/29/22 1102  WBC 6.6  HGB 12.6*  HCT 39.9  MCV 102.3*  PLT 163    Cardiac Enzymes: No results for input(s): "CKTOTAL", "CKMB", "CKMBINDEX", "TROPONINI" in the last 168 hours.  BNP: BNP (last 3 results) Recent Labs    12/25/21 1145 01/09/22 1142 10/29/22 1102  BNP 2,219.4* 629.3* 1,605.5*     ProBNP (last 3 results) No results for input(s): "PROBNP" in the last 8760 hours.   CBG: No results for input(s): "GLUCAP" in the last 168 hours.  Coagulation Studies: No results for input(s): "LABPROT", "INR" in the last 72 hours.  Imaging: CT Angio Chest PE W/Cm &/Or Wo Cm  Result Date: 10/29/2022 CLINICAL DATA:  Exertional shortness of breath, worse with supine positioning associated with dry cough EXAM: CT ANGIOGRAPHY CHEST WITH CONTRAST TECHNIQUE: Multidetector CT imaging of the chest was performed using the standard protocol during bolus administration of intravenous contrast. Multiplanar CT image reconstructions and MIPs were obtained to evaluate the vascular anatomy. RADIATION DOSE REDUCTION: This exam was performed according to the departmental dose-optimization program which includes automated exposure control, adjustment of the mA and/or kV according to patient size and/or use of iterative reconstruction technique. CONTRAST:  75mL OMNIPAQUE IOHEXOL 350 MG/ML SOLN COMPARISON:  CT chest dated 10/09/2022 FINDINGS: Cardiovascular: Left chest wall AICD leads terminate in the right atrium and ventricle. Right chest wall port terminates in the lower SVC. The study is high quality for the evaluation of pulmonary embolism. There are no filling defects in the central, lobar, segmental or subsegmental pulmonary artery branches to suggest acute pulmonary embolism. Main pulmonary artery measures 3.8 cm, unchanged (remeasured). Multichamber cardiomegaly. No significant pericardial fluid/thickening. Reflux of contrast material into the hepatic veins, suggesting a degree of right heart dysfunction. Coronary artery calcifications and aortic atherosclerosis. Mediastinum/Nodes: Imaged thyroid gland without nodules meeting criteria for imaging follow-up by size. Patulous esophagus. No pathologically enlarged axillary, supraclavicular, mediastinal, or hilar lymph nodes. Lungs/Pleura: The central airways are  patents. Mild diffuse bronchial wall thickening. Mild interlobular septal thickening. No focal consolidation. No pneumothorax. Small right and trace left pleural effusions. Upper abdomen: Normal. Musculoskeletal: No acute or abnormal lytic or blastic osseous lesions. Bilateral gynecomastia. Review of the MIP images confirms the above findings. IMPRESSION: 1. No evidence of pulmonary embolism. 2. Multichamber cardiomegaly with mild interstitial pulmonary edema and small right and trace left pleural effusions. 3. Unchanged dilation of the main pulmonary artery, which can be seen in the setting of pulmonary arterial hypertension. 4. Aortic Atherosclerosis (ICD10-I70.0). Coronary artery calcifications. Assessment for potential risk factor modification, dietary therapy or pharmacologic therapy may be warranted, if clinically indicated. Electronically Signed   By: Agustin Cree M.D.   On: 10/29/2022 16:45   DG Chest 2 View  Result Date: 10/29/2022 CLINICAL DATA:  Shortness of breath.  Chest pain. EXAM: CHEST - 2 VIEW COMPARISON:  12/25/2021. FINDINGS: Minimal blunting of right lateral costophrenic angle has improved since the prior study and may represent pleural thickening versus trace pleural effusion. Bilateral lung fields are clear. Left costophrenic angle is clear. Stable moderately enlarged cardio-mediastinal silhouette. There is a left sided 2-lead pacemaker. No acute osseous abnormalities. The soft tissues are within normal limits. Right-sided CT Port-A-Cath is seen with its tip overlying the upper portion of superior vena cava, unchanged. IMPRESSION: 1. Minimal blunting of the right lateral costophrenic angle, improved since the  prior study. Electronically Signed   By: Jules Schick M.D.   On: 10/29/2022 12:58     Patient Profile    Assessment/Plan   1. A/C Combined Systolic/Diastolic Heart Failure Initially diagnosed in 2018, EF 15-20%. LHC 2018 with mild-mod non obstructive CAD.  Has ST Jude ICD. EF  has been 20-25% for the last couple of years.  BNP > 1000. Volume overloaded on exam. Start 80 mg IV lasix twice a day.  For now will continue home meds.  Set up for RHC/LHC tomorrow to further assess hemodynamic/coronaries.   2. Permanent A Fib Rate controlled.  Continue Sotalol and Toprol XL.  Hold Xarelto for cath   3. CAD Non obstructive on cath 2018 Repeat cath in am.   4. Gastric Cancer Recent CT - no recurrence  5. Prostate Cancer  6. DMII Hold metformin and sglt2i.  Start ssi.    Admit to 3 east.    Tonye Becket, NP 10/29/2022, 5:36 PM  Advanced Heart Failure Team Pager [phone] (M-F; 7a - 5p)  Please contact [facility] for night-coverage after hours (4p -7a ) and weekends on [email or web address]  Patient seen with NP, agree with the above note.   He has a long history of CHF, diagnosed back in 2018.  Cath at that time with nonobstructive CAD.  Most recent echo in 8/23 with EF 20-25%, mild RV dysfunction, moderate-severe Jon.  He has a Secondary school teacher CRT-D device.  He is on sotalol but looks like he has been in atrial fibrillation chronically x years now. He has been treated for gastric cancer and is now being observed.   He has been taking all his meds at home except Lasix was cut back to 20 mg every other day in July.  He reports orthopnea for the last 3-4 nights.  His wife has noted tachypnea and he has been short of breath just with ADLs.  BP elevated at 1605 today, well above the past.  CTA chest showed no PE, +pulmonary edema. He is mildly tachypneic speaking with me.   General: Mildly tachypneic Neck: JVP 16 cm, no thyromegaly or thyroid nodule.  Lungs: Clear to auscultation bilaterally with normal respiratory effort. CV: Nondisplaced PMI.  Heart regular S1/S2, no S3/S4, no murmur.  No peripheral edema.  No carotid bruit.  Normal pedal pulses.  Abdomen: Soft, nontender, no hepatosplenomegaly, no distention.  Skin: Intact without lesions or rashes.  Neurologic: Alert and  oriented x 3.  Psych: Normal affect. Extremities: No clubbing or cyanosis. Cool extremities.  HEENT: Normal.   Assessment/Plan: 1. Acute on chronic systolic CHF: Long-standing cardiomyopathy, thought to be nonischemic.  St Jude CRT-D device.  Cath in 2018 with nonobstructive CAD but CI at that time was low at 1.9.  Most recent echo was in 8/23 showing EF 20-25%, mild RV dysfunction, moderate-severe Jon.  Lasix was cut back earlier this summer.  He presents with about a week of orthopnea and exertional dyspnea.  Weight up about 5 lbs at home and BNP significantly elevated at 1605.  Worrisomely, his extremities are cool and he has a history of low output HF (RHC in 2018).   - Start Lasix 80 mg IV bid.  - Needs repeat echo.  - Can continue home Entresto 24/26 bid.  - Continue spironolactone 25 daily - Continue Jardiance 10 daily - Continue low dose Toprol XL.  - I think he needs RHC/LHC to assess filling pressures and cardiac output (?low output HF).  Would repeat coronary evaluation  given moderate nonobstructive disease on prior cath in 2018.  I discussed risks/benefits with patient and he agreed to procedure.  2. Atrial fibrillation: Looking back at device interrogations, this looks permanent.   - Hold Xarelto for cath tomorrow.  - If AF is permanent, think we should stop sotalol unless he has had VT.  Will discuss with Dr. Ladona Ridgel.  3. Gastric cancer: Treated, now being monitored.  4. Type 2 diabetes: Hold metformin for cath.   Marca Ancona 10/29/2022 6:06 PM

## 2022-10-30 ENCOUNTER — Other Ambulatory Visit (HOSPITAL_COMMUNITY): Payer: Medicare HMO

## 2022-10-30 ENCOUNTER — Encounter (HOSPITAL_COMMUNITY)
Admission: EM | Disposition: A | Discharge status: Home / Self Care | Payer: Self-pay | Source: Home / Self Care | Attending: Cardiology

## 2022-10-30 ENCOUNTER — Inpatient Hospital Stay (HOSPITAL_COMMUNITY): Payer: Medicare HMO

## 2022-10-30 DIAGNOSIS — I2510 Atherosclerotic heart disease of native coronary artery without angina pectoris: Secondary | ICD-10-CM | POA: Diagnosis not present

## 2022-10-30 DIAGNOSIS — I4891 Unspecified atrial fibrillation: Secondary | ICD-10-CM | POA: Diagnosis not present

## 2022-10-30 DIAGNOSIS — I5023 Acute on chronic systolic (congestive) heart failure: Secondary | ICD-10-CM | POA: Diagnosis not present

## 2022-10-30 HISTORY — PX: RIGHT/LEFT HEART CATH AND CORONARY ANGIOGRAPHY: CATH118266

## 2022-10-30 LAB — POCT I-STAT EG7
Acid-base deficit: 2 mmol/L (ref 0.0–2.0)
Acid-base deficit: 3 mmol/L — ABNORMAL HIGH (ref 0.0–2.0)
Bicarbonate: 21.3 mmol/L (ref 20.0–28.0)
Bicarbonate: 22.2 mmol/L (ref 20.0–28.0)
Calcium, Ion: 1.12 mmol/L — ABNORMAL LOW (ref 1.15–1.40)
Calcium, Ion: 1.15 mmol/L (ref 1.15–1.40)
HCT: 38 % — ABNORMAL LOW (ref 39.0–52.0)
HCT: 39 % (ref 39.0–52.0)
Hemoglobin: 12.9 g/dL — ABNORMAL LOW (ref 13.0–17.0)
Hemoglobin: 13.3 g/dL (ref 13.0–17.0)
O2 Saturation: 60 %
O2 Saturation: 65 %
Potassium: 3.4 mmol/L — ABNORMAL LOW (ref 3.5–5.1)
Potassium: 3.5 mmol/L (ref 3.5–5.1)
Sodium: 134 mmol/L — ABNORMAL LOW (ref 135–145)
Sodium: 138 mmol/L (ref 135–145)
TCO2: 22 mmol/L (ref 22–32)
TCO2: 23 mmol/L (ref 22–32)
pCO2, Ven: 34.9 mmHg — ABNORMAL LOW (ref 44–60)
pCO2, Ven: 35.6 mmHg — ABNORMAL LOW (ref 44–60)
pH, Ven: 7.385 (ref 7.25–7.43)
pH, Ven: 7.413 (ref 7.25–7.43)
pO2, Ven: 32 mmHg (ref 32–45)
pO2, Ven: 33 mmHg (ref 32–45)

## 2022-10-30 LAB — SURGICAL PCR SCREEN
MRSA, PCR: NEGATIVE
Staphylococcus aureus: NEGATIVE

## 2022-10-30 LAB — GLUCOSE, CAPILLARY
Glucose-Capillary: 103 mg/dL — ABNORMAL HIGH (ref 70–99)
Glucose-Capillary: 92 mg/dL (ref 70–99)
Glucose-Capillary: 80 mg/dL (ref 70–99)
Glucose-Capillary: 149 mg/dL — ABNORMAL HIGH (ref 70–99)
Glucose-Capillary: 113 mg/dL — ABNORMAL HIGH (ref 70–99)

## 2022-10-30 LAB — BASIC METABOLIC PANEL
Anion gap: 14 (ref 5–15)
BUN: 24 mg/dL — ABNORMAL HIGH (ref 8–23)
CO2: 19 mmol/L — ABNORMAL LOW (ref 22–32)
Calcium: 9 mg/dL (ref 8.9–10.3)
Chloride: 104 mmol/L (ref 98–111)
Creatinine, Ser: 1.37 mg/dL — ABNORMAL HIGH (ref 0.61–1.24)
GFR, Estimated: 54 mL/min — ABNORMAL LOW (ref >60)
Glucose, Bld: 96 mg/dL (ref 70–99)
Potassium: 4.7 mmol/L (ref 3.5–5.1)
Sodium: 137 mmol/L (ref 135–145)

## 2022-10-30 LAB — HEMOGLOBIN A1C
Hgb A1c MFr Bld: 5.7 % — ABNORMAL HIGH (ref 4.8–5.6)
Mean Plasma Glucose: 116.89 mg/dL

## 2022-10-30 LAB — MAGNESIUM: Magnesium: 2 mg/dL (ref 1.7–2.4)

## 2022-10-30 SURGERY — RIGHT/LEFT HEART CATH AND CORONARY ANGIOGRAPHY
Anesthesia: LOCAL

## 2022-10-30 MED ORDER — LABETALOL HCL 5 MG/ML IV SOLN: 10.0000 mg | INTRAVENOUS | Status: AC | PRN

## 2022-10-30 MED ORDER — SODIUM CHLORIDE 0.9 % IV SOLN: INTRAVENOUS | Status: AC

## 2022-10-30 MED ORDER — MUPIROCIN 2 % EX OINT: 1.0000 | TOPICAL_OINTMENT | Freq: Two times a day (BID) | CUTANEOUS | Status: DC

## 2022-10-30 MED ORDER — IOHEXOL 350 MG/ML SOLN
INTRAVENOUS | Status: DC | PRN
  Administered 2022-10-30: 50 mL

## 2022-10-30 MED ORDER — SODIUM CHLORIDE 0.9 % IV SOLN: INTRAVENOUS | Status: DC

## 2022-10-30 MED ORDER — RIVAROXABAN 20 MG PO TABS
20.0000 mg | ORAL_TABLET | Freq: Every day | ORAL | Status: DC
  Administered 2022-10-30: 20 mg via ORAL
  Filled 2022-10-30: qty 1

## 2022-10-30 MED ORDER — VERAPAMIL HCL 2.5 MG/ML IV SOLN
INTRAVENOUS | Status: AC
  Filled 2022-10-30: qty 2

## 2022-10-30 MED ORDER — SODIUM CHLORIDE 0.9% FLUSH
3.0000 mL | Freq: Two times a day (BID) | INTRAVENOUS | Status: DC
  Administered 2022-10-31: 3 mL via INTRAVENOUS

## 2022-10-30 MED ORDER — SODIUM CHLORIDE 0.9 % IV SOLN: 250.0000 mL | INTRAVENOUS | Status: DC | PRN

## 2022-10-30 MED ORDER — FENTANYL CITRATE (PF) 100 MCG/2ML IJ SOLN
INTRAMUSCULAR | Status: AC
  Filled 2022-10-30: qty 2

## 2022-10-30 MED ORDER — POTASSIUM CHLORIDE 20 MEQ PO PACK: 40.0000 meq | PACK | Freq: Once | ORAL | Status: DC

## 2022-10-30 MED ORDER — ASPIRIN 81 MG PO CHEW: 81.0000 mg | CHEWABLE_TABLET | ORAL | Status: DC

## 2022-10-30 MED ORDER — SODIUM CHLORIDE 0.9% FLUSH: 3.0000 mL | INTRAVENOUS | Status: DC | PRN

## 2022-10-30 MED ORDER — LIDOCAINE HCL (PF) 1 % IJ SOLN
INTRAMUSCULAR | Status: DC | PRN
  Administered 2022-10-30: 1 mL
  Administered 2022-10-30: 2 mL

## 2022-10-30 MED ORDER — FENTANYL CITRATE (PF) 100 MCG/2ML IJ SOLN
INTRAMUSCULAR | Status: DC | PRN
  Administered 2022-10-30: 25 ug via INTRAVENOUS

## 2022-10-30 MED ORDER — ONDANSETRON HCL 4 MG/2ML IJ SOLN: 4.0000 mg | Freq: Four times a day (QID) | INTRAMUSCULAR | Status: DC | PRN

## 2022-10-30 MED ORDER — VERAPAMIL HCL 2.5 MG/ML IV SOLN
INTRAVENOUS | Status: DC | PRN
  Administered 2022-10-30: 10 mL via INTRA_ARTERIAL

## 2022-10-30 MED ORDER — HEPARIN (PORCINE) IN NACL 1000-0.9 UT/500ML-% IV SOLN
INTRAVENOUS | Status: DC | PRN
  Administered 2022-10-30 (×2): 500 mL

## 2022-10-30 MED ORDER — EMPAGLIFLOZIN 10 MG PO TABS
10.0000 mg | ORAL_TABLET | Freq: Every day | ORAL | Status: DC
  Administered 2022-10-31: 10 mg via ORAL
  Filled 2022-10-30: qty 1

## 2022-10-30 MED ORDER — MIDAZOLAM HCL 2 MG/2ML IJ SOLN
INTRAMUSCULAR | Status: DC | PRN
  Administered 2022-10-30: 1 mg via INTRAVENOUS

## 2022-10-30 MED ORDER — HEPARIN SODIUM (PORCINE) 1000 UNIT/ML IJ SOLN
INTRAMUSCULAR | Status: DC | PRN
  Administered 2022-10-30: 4000 [IU] via INTRAVENOUS

## 2022-10-30 MED ORDER — HEPARIN SODIUM (PORCINE) 1000 UNIT/ML IJ SOLN
INTRAMUSCULAR | Status: AC
  Filled 2022-10-30: qty 10

## 2022-10-30 MED ORDER — DIGOXIN 125 MCG PO TABS
0.1250 mg | ORAL_TABLET | Freq: Every day | ORAL | Status: DC
  Administered 2022-10-30 – 2022-10-31 (×2): 0.125 mg via ORAL
  Filled 2022-10-30 (×2): qty 1

## 2022-10-30 MED ORDER — HYDRALAZINE HCL 20 MG/ML IJ SOLN: 10.0000 mg | INTRAMUSCULAR | Status: AC | PRN

## 2022-10-30 MED ORDER — ACETAMINOPHEN 325 MG PO TABS: 650.0000 mg | ORAL_TABLET | ORAL | Status: DC | PRN

## 2022-10-30 MED ORDER — LIDOCAINE HCL (PF) 1 % IJ SOLN
INTRAMUSCULAR | Status: AC
  Filled 2022-10-30: qty 30

## 2022-10-30 MED ORDER — MIDAZOLAM HCL 2 MG/2ML IJ SOLN
INTRAMUSCULAR | Status: AC
  Filled 2022-10-30: qty 2

## 2022-10-30 SURGICAL SUPPLY — 12 items
CATH 5FR JL3.5 JR4 ANG PIG MP (CATHETERS) IMPLANT
CATH SWAN GANZ 7F STRAIGHT (CATHETERS) IMPLANT
DEVICE RAD COMP TR BAND LRG (VASCULAR PRODUCTS) IMPLANT
GLIDESHEATH SLEND SS 6F .021 (SHEATH) IMPLANT
GLIDESHEATH SLENDER 7FR .021G (SHEATH) IMPLANT
GUIDEWIRE INQWIRE 1.5J.035X260 (WIRE) IMPLANT
INQWIRE 1.5J .035X260CM (WIRE) ×1
PACK CARDIAC CATHETERIZATION (CUSTOM PROCEDURE TRAY) ×1 IMPLANT
SET ATX-X65L (MISCELLANEOUS) IMPLANT
SHEATH GLIDE SLENDER 4/5FR (SHEATH) IMPLANT
SHEATH PROBE COVER 6X72 (BAG) IMPLANT
WIRE EMERALD 3MM-J .025X260CM (WIRE) IMPLANT

## 2022-10-30 NOTE — Progress Notes (Signed)
Verbal consent obtained to clip right wrist and bilateral groin for heart cath today

## 2022-10-30 NOTE — Progress Notes (Signed)
Mobility Specialist Progress Note:    10/30/22 1626  Mobility  Activity Ambulated with assistance in hallway  Level of Assistance Contact guard assist, steadying assist  Assistive Device None  Distance Ambulated (ft) 350 ft  Activity Response Tolerated well  Mobility Referral Yes  $Mobility charge 1 Mobility  Mobility Specialist Start Time (ACUTE ONLY) 1543  Mobility Specialist Stop Time (ACUTE ONLY) 1558  Mobility Specialist Time Calculation (min) (ACUTE ONLY) 15 min   Received pt in bed having no complaints and agreeable to mobility. Pt was asymptomatic throughout ambulation and returned to room w/o fault. Left in chair w/ call bell in reach and all needs met.   Thompson Grayer Mobility Specialist  Please contact vis Secure Chat or  Rehab Office [phone]

## 2022-10-30 NOTE — ED Notes (Signed)
ED TO INPATIENT HANDOFF REPORT  ED Nurse Name and Phone #: Pollie Meyer [phone]  S Name/Age/Gender [name] 72 y.o. male Room/Bed: 044C/044C  Code Status   Code Status: Full Code  Home/SNF/Other Home Patient oriented to: self, place, time, and situation Is this baseline? Yes   Triage Complete: Triage complete  Chief Complaint Acute on chronic systolic (congestive) heart failure (HCC) [I50.23] Acute on chronic systolic heart failure (HCC) [I50.23]  Triage Note Pt came in POV for SOB x 2 days with dry cough.    Pt is not in distress at this time.    Allergies Allergies  Allergen Reactions   Aspirin Anaphylaxis and Hives   Sulfa Antibiotics Anaphylaxis, Hives, Swelling and Other (See Comments)    Swollen lips    Level of Care/Admitting Diagnosis ED Disposition     ED Disposition  Admit   Condition  --   Comment  Hospital Area: [facility] [[id number]]  Level of Care: Progressive [[id number]]  Admit to Progressive based on following criteria: CARDIOVASCULAR & THORACIC of moderate stability with acute coronary syndrome symptoms/low risk myocardial infarction/hypertensive urgency/arrhythmias/heart failure potentially compromising stability and stable post cardiovascular intervention patients.  May admit patient to [facility] or [facility] if equivalent level of care is available:: No  Covid Evaluation: Asymptomatic - no recent exposure (last 10 days) testing not required  Diagnosis: Acute on chronic systolic heart failure (HCC) [428.23.ICD-9-CM]  Admitting Physician: Laurey Morale [[id number]]  Attending Physician: Laurey Morale [[id number]]  Bed request comments: [facility]  Certification:: I certify this patient will need inpatient services for at least 2 midnights          B Medical/Surgery History Past Medical History:  Diagnosis Date   AICD (automatic cardioverter/defibrillator) present 05/25/2016   biv icd   Bunion, right    Chronic combined  systolic and diastolic CHF (congestive heart failure) (HCC)    Echo 1/18: Mild conc LVH, EF 15-20, severe diff HK, inf and inf-septal AK, Gr 3 DD, mild to mod MR, severe LAE, mod reduced RVSF, mod RAE, mild TR, PASP 50   Coronary artery disease involving native coronary artery without angina pectoris 04/17/2016   LHC 1/18: pLCx 30, mLCx 20, mRCA 40, dRCA 20, LVEDP 23, mean RA 8, PA 42/20, PCWP 17   Diabetes mellitus without complication (HCC)    Gastric cancer (HCC)    History of atrial fibrillation    History of atrial flutter    History of cardiomegaly 06/07/2016   Noted on CXR   History of colon polyps 06/28/2017   Noted on colonoscopy   Hypertension    LBBB (left bundle branch block)    Nausea vomiting and diarrhea 04/04/2021   NICM (nonischemic cardiomyopathy) (HCC)    Echo 1/18:  Mild conc LVH, EF 15-20, severe diff HK, inf and inf-septal AK, Gr 3 DD, mild to mod MR, severe LAE, mod reduced RVSF, mod RAE, mild TR, PASP 50   Other secondary pulmonary hypertension (HCC) 04/17/2016   Prostate cancer (HCC) 2019   Sigmoid diverticulosis 06/28/2017   Noted on colonoscopy   Past Surgical History:  Procedure Laterality Date   BIOPSY  12/01/2020   Procedure: BIOPSY;  Surgeon: Jeani Hawking, MD;  Location: [facility] ENDOSCOPY;  Service: Endoscopy;;   BIOPSY  06/09/2021   Procedure: BIOPSY;  Surgeon: Jeani Hawking, MD;  Location: [facility] ENDOSCOPY;  Service: Gastroenterology;;   BIOPSY  06/29/2022   Procedure: BIOPSY;  Surgeon: Jeani Hawking, MD;  Location: [facility] ENDOSCOPY;  Service: Gastroenterology;;   BIV ICD INSERTION CRT-D N/A 05/25/2016   Procedure: BiV ICD Insertion CRT-D;  Surgeon: Marinus Maw, MD;  Location: [facility] INVASIVE CV LAB;  Service: Cardiovascular;  Laterality: N/A;   CARDIAC CATHETERIZATION N/A 03/02/2016   Procedure: Right/Left Heart Cath and Coronary Angiography;  Surgeon: Yvonne Kendall, MD;  Location: [facility] INVASIVE CV LAB;  Service: Cardiovascular;  Laterality: N/A;   CARDIOVERSION N/A  07/17/2016   Procedure: Cardioversion;  Surgeon: Marinus Maw, MD;  Location: [facility] INVASIVE CV LAB;  Service: Cardiovascular;  Laterality: N/A;   COLONOSCOPY WITH PROPOFOL N/A 06/28/2017   Procedure: COLONOSCOPY WITH PROPOFOL;  Surgeon: Jeani Hawking, MD;  Location: [facility] ENDOSCOPY;  Service: Endoscopy;  Laterality: N/A;   colonscopy  2009   ESOPHAGOGASTRODUODENOSCOPY N/A 12/01/2020   Procedure: ESOPHAGOGASTRODUODENOSCOPY (EGD);  Surgeon: Jeani Hawking, MD;  Location: [facility] ENDOSCOPY;  Service: Endoscopy;  Laterality: N/A;  IDA/guaiac positive stools   ESOPHAGOGASTRODUODENOSCOPY (EGD) WITH PROPOFOL N/A 12/16/2020   Procedure: ESOPHAGOGASTRODUODENOSCOPY (EGD) WITH PROPOFOL;  Surgeon: Jeani Hawking, MD;  Location: [facility] ENDOSCOPY;  Service: Endoscopy;  Laterality: N/A;   ESOPHAGOGASTRODUODENOSCOPY (EGD) WITH PROPOFOL N/A 06/09/2021   Procedure: ESOPHAGOGASTRODUODENOSCOPY (EGD) WITH PROPOFOL;  Surgeon: Jeani Hawking, MD;  Location: [facility] ENDOSCOPY;  Service: Gastroenterology;  Laterality: N/A;   ESOPHAGOGASTRODUODENOSCOPY (EGD) WITH PROPOFOL N/A 06/29/2022   Procedure: ESOPHAGOGASTRODUODENOSCOPY (EGD) WITH PROPOFOL;  Surgeon: Jeani Hawking, MD;  Location: [facility] ENDOSCOPY;  Service: Gastroenterology;  Laterality: N/A;   GOLD SEED IMPLANT N/A 12/24/2017   Procedure: GOLD SEED IMPLANT, TRANSERINEAL;  Surgeon: Jerilee Field, MD;  Location: [facility] ORS;  Service: Urology;  Laterality: N/A;   INSERT / REPLACE / REMOVE PACEMAKER     LEAD REVISION  10/10/2018   LEAD REVISION/REPAIR N/A 10/10/2018   Procedure: LEAD REVISION/REPAIR;  Surgeon: Marinus Maw, MD;  Location: [facility] INVASIVE CV LAB;  Service: Cardiovascular;  Laterality: N/A;   POLYPECTOMY  06/28/2017   Procedure: POLYPECTOMY;  Surgeon: Jeani Hawking, MD;  Location: [facility] ENDOSCOPY;  Service: Endoscopy;;  ascending and descending colon polyp   PORTACATH PLACEMENT Right 12/23/2020   Procedure: INSERTION PORT-A-CATH;  Surgeon: Fritzi Mandes, MD;  Location: [facility] OR;   Service: General;  Laterality: Right;   PROSTATE BIOPSY  02/20/2017   SPACE OAR INSTILLATION N/A 12/24/2017   Procedure: SPACE OAR INSTILLATION;  Surgeon: Jerilee Field, MD;  Location: [facility] ORS;  Service: Urology;  Laterality: N/A;   TOTAL KNEE ARTHROPLASTY Right 08/16/2017   Procedure: RIGHT TOTAL KNEE ARTHROPLASTY;  Surgeon: Frederico Hamman, MD;  Location: [facility] OR;  Service: Orthopedics;  Laterality: Right;   UPPER ESOPHAGEAL ENDOSCOPIC ULTRASOUND (EUS) N/A 12/16/2020   Procedure: UPPER ESOPHAGEAL ENDOSCOPIC ULTRASOUND (EUS);  Surgeon: Jeani Hawking, MD;  Location: [facility] ENDOSCOPY;  Service: Endoscopy;  Laterality: N/A;     A IV Location/Drains/Wounds Patient Lines/Drains/Airways Status     Active Line/Drains/Airways     Name Placement date Placement time Site Days   Implanted Port 12/23/20 Right Chest 12/23/20  1142  Chest  676   Peripheral IV 10/29/22 20 G Left Antecubital 10/29/22  1118  Antecubital  1   Wound / Incision (Open or Dehisced) 04/02/21 Venous stasis ulcer Foot Anterior;Right open wound on foot 04/02/21  0800  Foot  576            Intake/Output Last 24 hours  Intake/Output Summary (Last 24 hours) at 10/30/2022 0052 Last data filed at 10/29/2022 2315 Gross per 24 hour  Intake --  Output 3010 ml  Net -  3010 ml    Labs/Imaging Results for orders placed or performed during the hospital encounter of 10/29/22 (from the past 48 hour(s))  Basic metabolic panel     Status: Abnormal   Collection Time: 10/29/22 11:02 AM  Result Value Ref Range   Sodium 139 135 - 145 mmol/L   Potassium 4.0 3.5 - 5.1 mmol/L   Chloride 110 98 - 111 mmol/L   CO2 22 22 - 32 mmol/L   Glucose, Bld 108 (H) 70 - 99 mg/dL    Comment: Glucose reference range applies only to samples taken after fasting for at least 8 hours.   BUN 21 8 - 23 mg/dL   Creatinine, Ser 0.98 0.61 - 1.24 mg/dL   Calcium 8.7 (L) 8.9 - 10.3 mg/dL   GFR, Estimated >11 >91 mL/min    Comment: (NOTE) Calculated using the  CKD-EPI Creatinine Equation (2021)    Anion gap 7 5 - 15    Comment: Performed at [facility], [address], [place], [place] [address]  CBC     Status: Abnormal   Collection Time: 10/29/22 11:02 AM  Result Value Ref Range   WBC 6.6 4.0 - 10.5 K/uL   RBC 3.90 (L) 4.22 - 5.81 MIL/uL   Hemoglobin 12.6 (L) 13.0 - 17.0 g/dL   HCT 56.2 13.0 - 86.5 %   MCV 102.3 (H) 80.0 - 100.0 fL   MCH 32.3 26.0 - 34.0 pg   MCHC 31.6 30.0 - 36.0 g/dL   RDW 78.4 69.6 - 29.5 %   Platelets 163 150 - 400 K/uL   nRBC 0.0 0.0 - 0.2 %    Comment: Performed at [facility], [address], [place], [place] [address]  Brain natriuretic peptide     Status: Abnormal   Collection Time: 10/29/22 11:02 AM  Result Value Ref Range   B Natriuretic Peptide 1,605.5 (H) 0.0 - 100.0 pg/mL    Comment: Performed at [name], [address], [place], [place] [address]   CT Angio Chest PE W/Cm &/Or Wo Cm  Result Date: 10/29/2022 CLINICAL DATA:  Exertional shortness of breath, worse with supine positioning associated with dry cough EXAM: CT ANGIOGRAPHY CHEST WITH CONTRAST TECHNIQUE: Multidetector CT imaging of the chest was performed using the standard protocol during bolus administration of intravenous contrast. Multiplanar CT image reconstructions and MIPs were obtained to evaluate the vascular anatomy. RADIATION DOSE REDUCTION: This exam was performed according to the departmental dose-optimization program which includes automated exposure control, adjustment of the mA and/or kV according to patient size and/or use of iterative reconstruction technique. CONTRAST:  75mL OMNIPAQUE IOHEXOL 350 MG/ML SOLN COMPARISON:  CT chest dated 10/09/2022 FINDINGS: Cardiovascular: Left chest wall AICD leads terminate in the right atrium and ventricle. Right chest wall port terminates in the lower SVC. The study is high quality for the evaluation of pulmonary embolism. There are no filling defects in the central, lobar,  segmental or subsegmental pulmonary artery branches to suggest acute pulmonary embolism. Main pulmonary artery measures 3.8 cm, unchanged (remeasured). Multichamber cardiomegaly. No significant pericardial fluid/thickening. Reflux of contrast material into the hepatic veins, suggesting a degree of right heart dysfunction. Coronary artery calcifications and aortic atherosclerosis. Mediastinum/Nodes: Imaged thyroid gland without nodules meeting criteria for imaging follow-up by size. Patulous esophagus. No pathologically enlarged axillary, supraclavicular, mediastinal, or hilar lymph nodes. Lungs/Pleura: The central airways are patents. Mild diffuse bronchial wall thickening. Mild interlobular septal thickening. No focal consolidation. No pneumothorax. Small right  and trace left pleural effusions. Upper abdomen: Normal. Musculoskeletal: No acute or abnormal lytic or blastic osseous lesions. Bilateral gynecomastia. Review of the MIP images confirms the above findings. IMPRESSION: 1. No evidence of pulmonary embolism. 2. Multichamber cardiomegaly with mild interstitial pulmonary edema and small right and trace left pleural effusions. 3. Unchanged dilation of the main pulmonary artery, which can be seen in the setting of pulmonary arterial hypertension. 4. Aortic Atherosclerosis (ICD10-I70.0). Coronary artery calcifications. Assessment for potential risk factor modification, dietary therapy or pharmacologic therapy may be warranted, if clinically indicated. Electronically Signed   By: Agustin Cree M.D.   On: 10/29/2022 16:45   DG Chest 2 View  Result Date: 10/29/2022 CLINICAL DATA:  Shortness of breath.  Chest pain. EXAM: CHEST - 2 VIEW COMPARISON:  12/25/2021. FINDINGS: Minimal blunting of right lateral costophrenic angle has improved since the prior study and may represent pleural thickening versus trace pleural effusion. Bilateral lung fields are clear. Left costophrenic angle is clear. Stable moderately enlarged  cardio-mediastinal silhouette. There is a left sided 2-lead pacemaker. No acute osseous abnormalities. The soft tissues are within normal limits. Right-sided CT Port-A-Cath is seen with its tip overlying the upper portion of superior vena cava, unchanged. IMPRESSION: 1. Minimal blunting of the right lateral costophrenic angle, improved since the prior study. Electronically Signed   By: Jules Schick M.D.   On: 10/29/2022 12:58    Pending Labs Unresulted Labs (From admission, onward)     Start     Ordered   10/30/22 0500  Basic metabolic panel  Daily,   R     Comments: As Scheduled for 5 days    10/29/22 1828   10/29/22 1741  Hemoglobin A1c  Once,   R       Comments: To assess prior glycemic control    10/29/22 1740            Vitals/Pain Today's Vitals   10/29/22 2015 10/29/22 2045 10/29/22 2300 10/29/22 2335  BP: 135/86 105/80 100/70 100/70  Pulse: 64 64 63 66  Resp:  (!) 24 14 18   Temp:    98 F (36.7 C)  TempSrc:      SpO2: 100% 100% 100% 95%  Weight:      PainSc:        Isolation Precautions No active isolations  Medications Medications  metoprolol succinate (TOPROL-XL) 24 hr tablet 25 mg (25 mg Oral Given 10/29/22 2010)  sacubitril-valsartan (ENTRESTO) 24-26 mg per tablet (1 tablet Oral Given 10/29/22 2145)  sotalol (BETAPACE) tablet 120 mg (120 mg Oral Given 10/29/22 2237)  pantoprazole (PROTONIX) EC tablet 40 mg (40 mg Oral Given 10/29/22 2145)  gabapentin (NEURONTIN) capsule 100 mg (100 mg Oral Given 10/29/22 2010)  sodium chloride flush (NS) 0.9 % injection 3 mL (3 mLs Intravenous Given 10/29/22 2146)  sodium chloride flush (NS) 0.9 % injection 3 mL (has no administration in time range)  0.9 %  sodium chloride infusion (has no administration in time range)  acetaminophen (TYLENOL) tablet 650 mg (has no administration in time range)  ondansetron (ZOFRAN) injection 4 mg (has no administration in time range)  furosemide (LASIX) injection 80 mg (80 mg Intravenous  Given 10/29/22 2013)  potassium chloride (KLOR-CON) packet 40 mEq (40 mEq Oral Given 10/29/22 2143)  insulin aspart (novoLOG) injection 0-9 Units (has no administration in time range)  atorvastatin (LIPITOR) tablet 40 mg (has no administration in time range)  spironolactone (ALDACTONE) tablet 25 mg (has no administration in time range)  loratadine (CLARITIN) tablet 10 mg (has no administration in time range)  pneumococcal 20-valent conjugate vaccine (PREVNAR 20) injection 0.5 mL (has no administration in time range)  influenza vaccine adjuvanted (FLUAD) injection 0.5 mL (has no administration in time range)  iohexol (OMNIPAQUE) 350 MG/ML injection 75 mL (75 mLs Intravenous Contrast Given 10/29/22 1533)    Mobility walks     Focused Assessments Cardiac Assessment Handoff:  Cardiac Rhythm: (S) Ventricular paced Lab Results  Component Value Date   CKTOTAL 163 04/01/2021   No results found for: "DDIMER" Does the Patient currently have chest pain? No   , Neuro Assessment Handoff:  Swallow screen pass? Yes  Cardiac Rhythm: (S) Ventricular paced       Neuro Assessment:   Neuro Checks:      Has TPA been given? No If patient is a Neuro Trauma and patient is going to OR before floor call report to 4N Charge nurse: [phone] or [phone]  , Pulmonary Assessment Handoff:  Lung sounds: Bilateral Breath Sounds: Fine crackles, Diminished O2 Device: Room Air      R Recommendations: See Admitting Provider Note  Report given to:   Additional Notes:

## 2022-10-30 NOTE — Interval H&P Note (Signed)
History and Physical Interval Note:  10/30/2022 10:34 AM  Jon Williams  has presented today for surgery, with the diagnosis of heart failure.  The various methods of treatment have been discussed with the patient and family. After consideration of risks, benefits and other options for treatment, the patient has consented to  Procedure(s): RIGHT/LEFT HEART CATH AND CORONARY ANGIOGRAPHY (N/A) as a surgical intervention.  The patient's history has been reviewed, patient examined, no change in status, stable for surgery.  I have reviewed the patient's chart and labs.  Questions were answered to the patient's satisfaction.     Naven Giambalvo [employer]

## 2022-10-30 NOTE — Progress Notes (Signed)
Heart Failure Navigator Progress Note  Assessed for Heart & Vascular TOC clinic readiness.  Patient does not meet criteria due to Advanced Heart Failure Team patient of Dr. Shirlee Latch.   Navigator will sign off at this time.   Rhae Hammock, BSN, Scientist, clinical (histocompatibility and immunogenetics) Only

## 2022-10-30 NOTE — Progress Notes (Signed)
TR band has been removed, site is level zero, gauze and Tegaderm applied to site.

## 2022-10-30 NOTE — Progress Notes (Signed)
   In permanent A fib. Dr Shirlee Latch discussed with Dr Ladona Ridgel.   Stop sotalool   Kinston Magnan NP-C  9:54 AM

## 2022-10-30 NOTE — Plan of Care (Signed)
  Problem: Education: Goal: Understanding of CV disease, CV risk reduction, and recovery process will improve Outcome: Progressing   Problem: Activity: Goal: Ability to return to baseline activity level will improve Outcome: Progressing   Problem: Cardiovascular: Goal: Ability to achieve and maintain adequate cardiovascular perfusion will improve Outcome: Progressing Goal: Vascular access site(s) Level 0-1 will be maintained Outcome: Progressing   Problem: Health Behavior/Discharge Planning: Goal: Ability to safely manage health-related needs after discharge will improve Outcome: Progressing   Problem: Education: Goal: Ability to describe self-care measures that may prevent or decrease complications (Diabetes Survival Skills Education) will improve Outcome: Progressing   Problem: Coping: Goal: Ability to adjust to condition or change in health will improve Outcome: Progressing

## 2022-10-30 NOTE — TOC Initial Note (Signed)
Transition of Care [facility]) - Initial/Assessment Note    Patient Details  Name: Jon Williams MRN: [id number] Date of Birth: 12/24/1950  Transition of Care [facility]) CM/SW Contact:    Nicanor Bake Phone Number: [phone] 10/30/2022, 3:31 PM  Clinical Narrative:    HF CSW met with pt and wife at bedside. Pt stated that he has a history of HH services during his time of chemo, which was almost a year ago. Pts wife stated that the company they used was a Therapist, nutritional. Pt stated that he used to use a wheelchair, cane, and walker during chemo. Pt stated that he currently still has a wheelchair but has not had to use it in quite some time. Pt stated that he has a scale at home. CSW explained that a follow up hospital visit would be scheduled closer to dc date.   CSW scheduled follow up hospital appointment for  Thursday, November 15, 2022 at 8:20 AM.   TOC will continue to follow.              Expected Discharge Plan: Home/Self Care Barriers to Discharge: Continued Medical Work up   Patient Goals and CMS Choice            Expected Discharge Plan and Services       Living arrangements for the past 2 months: Single Family Home                                      Prior Living Arrangements/Services Living arrangements for the past 2 months: Single Family Home Lives with:: Spouse Patient language and need for interpreter reviewed:: No Do you feel safe going back to the place where you live?: Yes      Need for Family Participation in Patient Care: No (Comment) Care giver support system in place?: Yes (comment)   Criminal Activity/Legal Involvement Pertinent to Current Situation/Hospitalization: No - Comment as needed  Activities of Daily Living Home Assistive Devices/Equipment: Blood pressure cuff, Built-in shower seat, Wheelchair, Eyeglasses, Grab bars in shower, Hand-held shower hose, Raised toilet seat with rails, Scales (rollater) ADL Screening (condition at  time of admission) Patient's cognitive ability adequate to safely complete daily activities?: Yes Is the patient deaf or have difficulty hearing?: No Does the patient have difficulty seeing, even when wearing glasses/contacts?: No Does the patient have difficulty concentrating, remembering, or making decisions?: No Patient able to express need for assistance with ADLs?: Yes Does the patient have difficulty dressing or bathing?: No Independently performs ADLs?: Yes (appropriate for developmental age) Does the patient have difficulty walking or climbing stairs?: No Weakness of Legs: None Weakness of Arms/Hands: None  Permission Sought/Granted                  Emotional Assessment Appearance:: Appears stated age Attitude/Demeanor/Rapport: Engaged Affect (typically observed): Appropriate Orientation: : Oriented to Self, Oriented to Place, Oriented to  Time, Oriented to Situation Alcohol / Substance Use: Not Applicable Psych Involvement: No (comment)  Admission diagnosis:  Acute on chronic systolic heart failure (HCC) [I50.23] Dyspnea on exertion [R06.09] Acute on chronic systolic (congestive) heart failure (HCC) [I50.23] Patient Active Problem List   Diagnosis Date Noted   Acute on chronic systolic (congestive) heart failure (HCC) 10/29/2022   Acute on chronic systolic heart failure (HCC) 10/29/2022   Obesity (BMI 30-39.9) 12/25/2021   Community acquired pneumonia of right lower lobe of lung  12/25/2021   Malnutrition of moderate degree 09/15/2021   DM (diabetes mellitus), type 2 (HCC) 09/14/2021   Essential hypertension 09/13/2021   Dyslipidemia 09/13/2021   DNR (do not resuscitate) 09/13/2021   Port-A-Cath in place 06/15/2021   Hypotension due to hypovolemia 04/04/2021   Nausea vomiting and diarrhea 04/04/2021   Thrombocytopenia (HCC) 04/04/2021   Leukocytosis 04/04/2021   Acute renal failure superimposed on stage 3a chronic kidney disease (HCC) 04/04/2021   Diarrhea  04/01/2021   Occult blood in stools 04/01/2021   Foot ulcer, right (HCC) 04/01/2021   Hypotension 04/01/2021   Dehydration 04/01/2021   Iron deficiency anemia due to chronic blood loss 12/12/2020   Gastric cancer (HCC) 12/08/2020   Symptomatic anemia 11/30/2020   Gastrointestinal hemorrhage    Corns and callosities 08/07/2019   Blood clotting disorder (HCC) 05/06/2019   Coagulation defect (HCC) 05/06/2019   Pacemaker lead malfunction 10/10/2018   Pacemaker complications 10/10/2018   Pacemaker lead failure, sequela 10/10/2018   S/P knee replacement 08/16/2017   S/P total knee arthroplasty 08/16/2017   Malignant neoplasm of prostate (HCC) 04/02/2017   ICD (implantable cardioverter-defibrillator) in place    Persistent atrial fibrillation (HCC) 05/25/2016   Coronary artery disease involving native coronary artery without angina pectoris 04/17/2016   Other secondary pulmonary hypertension (HCC) 04/17/2016   Acute on chronic combined systolic and diastolic CHF (congestive heart failure) (HCC)    NICM (nonischemic cardiomyopathy) (HCC)    LBBB (left bundle branch block)    PCP:  Renford Dills, MD Pharmacy:   [facility] - [place], [place] - [address] [place]  [address] Phone: [phone] Fax: [phone]  [place] [facility] [address] [place] [address] Phone: [phone] Fax: [phone]     Social Determinants of Health (SDOH) Social History: SDOH Screenings   Food Insecurity: No Food Insecurity (10/29/2022)  Housing: Low Risk  (10/29/2022)  Transportation Needs: No Transportation Needs (10/29/2022)  Utilities: Not At Risk (10/29/2022)  Alcohol Screen: Low Risk  (09/26/2021)  Depression (PHQ2-9): Low Risk  (12/03/2019)  Financial Resource Strain: Low Risk  (12/26/2021)  Physical Activity: Insufficiently Active (09/26/2021)  Stress: No Stress Concern Present (09/26/2021)   Tobacco Use: Low Risk  (10/29/2022)   SDOH Interventions:     Readmission Risk Interventions    12/27/2021   10:50 AM 09/15/2021    2:29 PM 04/04/2021   12:46 PM  Readmission Risk Prevention Plan  Transportation Screening Complete Complete Complete  PCP or Specialist Appt within 3-5 Days Complete Complete   HRI or Home Care Consult Complete Complete   Social Work Consult for Recovery Care Planning/Counseling Complete Complete   Palliative Care Screening Not Applicable Not Applicable   Medication Review [employer]) Complete Complete Complete  PCP or Specialist appointment within 3-5 days of discharge   Complete  HRI or Home Care Consult   Complete  SW Recovery Care/Counseling Consult   Complete  Palliative Care Screening   Not Applicable  Skilled Nursing Facility   Not Applicable

## 2022-10-30 NOTE — Plan of Care (Signed)
  Problem: Activity: Goal: Ability to return to baseline activity level will improve Outcome: Progressing   Problem: Coping: Goal: Ability to adjust to condition or change in health will improve Outcome: Progressing   Problem: Fluid Volume: Goal: Ability to maintain a balanced intake and output will improve Outcome: Progressing   Problem: Health Behavior/Discharge Planning: Goal: Ability to identify and utilize available resources and services will improve Outcome: Progressing Goal: Ability to manage health-related needs will improve Outcome: Progressing   Problem: Skin Integrity: Goal: Risk for impaired skin integrity will decrease Outcome: Progressing

## 2022-10-30 NOTE — Progress Notes (Addendum)
Advanced Heart Failure Rounding Note  PCP-Cardiologist: Kristeen Miss, MD   Subjective:    Volume overloaded on admission. Diuresis started yesterday with 80 IV lasix BID. -4.1L UOP. Weight down 2lbs.   Feels better this morning. He was able to get some sleep last night. Plan for [facility] today.   Objective:   Weight Range: 82.7 kg Body mass index is 29.44 kg/m.   Vital Signs:   Temp:  [97.6 F (36.4 C)-98 F (36.7 C)] 97.7 F (36.5 C) (09/17 0750) Pulse Rate:  [48-72] 63 (09/17 0750) Resp:  [12-34] 18 (09/17 0750) BP: (100-135)/(64-94) 107/64 (09/17 0750) SpO2:  [92 %-100 %] 98 % (09/17 0750) Weight:  [82.7 kg-83.7 kg] 82.7 kg (09/17 0134) Last BM Date : 10/29/22 (per pt report in the ED)  Weight change: Filed Weights   10/29/22 1832 10/30/22 0134  Weight: 83.7 kg 82.7 kg    Intake/Output:   Intake/Output Summary (Last 24 hours) at 10/30/2022 0854 Last data filed at 10/30/2022 0607 Gross per 24 hour  Intake 1.43 ml  Output 4160 ml  Net -4158.57 ml     Physical Exam    General:  well appearing.  No respiratory difficulty HEENT: normal Neck: supple. JVD ~10 cm. Carotids 2+ bilat; no bruits. No lymphadenopathy or thyromegaly appreciated. Cor: PMI nondisplaced. Regular rate & rhythm. No rubs, gallops or murmurs. Lungs: clear Abdomen: soft, nontender, nondistended. No hepatosplenomegaly. No bruits or masses. Good bowel sounds. Extremities: no cyanosis, clubbing, rash, trace BLE edema  Neuro: alert & oriented x 3, cranial nerves grossly intact. moves all 4 extremities w/o difficulty. Affect pleasant.   Telemetry   V oaced 60s 5 beat NSVT (Personally reviewed)    EKG    No new EKG to review  Labs    CBC Recent Labs    10/29/22 1102  WBC 6.6  HGB 12.6*  HCT 39.9  MCV 102.3*  PLT 163   Basic Metabolic Panel Recent Labs    81/19/14 1102 10/30/22 0246  NA 139 137  K 4.0 4.7  CL 110 104  CO2 22 19*  GLUCOSE 108* 96  BUN 21 24*  CREATININE  1.24 1.37*  CALCIUM 8.7* 9.0   Liver Function Tests No results for input(s): "AST", "ALT", "ALKPHOS", "BILITOT", "PROT", "ALBUMIN" in the last 72 hours. No results for input(s): "LIPASE", "AMYLASE" in the last 72 hours. Cardiac Enzymes No results for input(s): "CKTOTAL", "CKMB", "CKMBINDEX", "TROPONINI" in the last 72 hours.  BNP: BNP (last 3 results) Recent Labs    12/25/21 1145 01/09/22 1142 10/29/22 1102  BNP 2,219.4* 629.3* 1,605.5*    ProBNP (last 3 results) No results for input(s): "PROBNP" in the last 8760 hours.   D-Dimer No results for input(s): "DDIMER" in the last 72 hours. Hemoglobin A1C Recent Labs    10/30/22 0246  HGBA1C 5.7*   Fasting Lipid Panel No results for input(s): "CHOL", "HDL", "LDLCALC", "TRIG", "CHOLHDL", "LDLDIRECT" in the last 72 hours. Thyroid Function Tests No results for input(s): "TSH", "T4TOTAL", "T3FREE", "THYROIDAB" in the last 72 hours.  Invalid input(s): "FREET3"  Other results:   Imaging    CT Angio Chest PE W/Cm &/Or Wo Cm  Result Date: 10/29/2022 CLINICAL DATA:  Exertional shortness of breath, worse with supine positioning associated with dry cough EXAM: CT ANGIOGRAPHY CHEST WITH CONTRAST TECHNIQUE: Multidetector CT imaging of the chest was performed using the standard protocol during bolus administration of intravenous contrast. Multiplanar CT image reconstructions and MIPs were obtained to evaluate the  vascular anatomy. RADIATION DOSE REDUCTION: This exam was performed according to the departmental dose-optimization program which includes automated exposure control, adjustment of the mA and/or kV according to patient size and/or use of iterative reconstruction technique. CONTRAST:  75mL OMNIPAQUE IOHEXOL 350 MG/ML SOLN COMPARISON:  CT chest dated 10/09/2022 FINDINGS: Cardiovascular: Left chest wall AICD leads terminate in the right atrium and ventricle. Right chest wall port terminates in the lower SVC. The study is high quality  for the evaluation of pulmonary embolism. There are no filling defects in the central, lobar, segmental or subsegmental pulmonary artery branches to suggest acute pulmonary embolism. Main pulmonary artery measures 3.8 cm, unchanged (remeasured). Multichamber cardiomegaly. No significant pericardial fluid/thickening. Reflux of contrast material into the hepatic veins, suggesting a degree of right heart dysfunction. Coronary artery calcifications and aortic atherosclerosis. Mediastinum/Nodes: Imaged thyroid gland without nodules meeting criteria for imaging follow-up by size. Patulous esophagus. No pathologically enlarged axillary, supraclavicular, mediastinal, or hilar lymph nodes. Lungs/Pleura: The central airways are patents. Mild diffuse bronchial wall thickening. Mild interlobular septal thickening. No focal consolidation. No pneumothorax. Small right and trace left pleural effusions. Upper abdomen: Normal. Musculoskeletal: No acute or abnormal lytic or blastic osseous lesions. Bilateral gynecomastia. Review of the MIP images confirms the above findings. IMPRESSION: 1. No evidence of pulmonary embolism. 2. Multichamber cardiomegaly with mild interstitial pulmonary edema and small right and trace left pleural effusions. 3. Unchanged dilation of the main pulmonary artery, which can be seen in the setting of pulmonary arterial hypertension. 4. Aortic Atherosclerosis (ICD10-I70.0). Coronary artery calcifications. Assessment for potential risk factor modification, dietary therapy or pharmacologic therapy may be warranted, if clinically indicated. Electronically Signed   By: Agustin Cree M.D.   On: 10/29/2022 16:45   DG Chest 2 View  Result Date: 10/29/2022 CLINICAL DATA:  Shortness of breath.  Chest pain. EXAM: CHEST - 2 VIEW COMPARISON:  12/25/2021. FINDINGS: Minimal blunting of right lateral costophrenic angle has improved since the prior study and may represent pleural thickening versus trace pleural effusion.  Bilateral lung fields are clear. Left costophrenic angle is clear. Stable moderately enlarged cardio-mediastinal silhouette. There is a left sided 2-lead pacemaker. No acute osseous abnormalities. The soft tissues are within normal limits. Right-sided CT Port-A-Cath is seen with its tip overlying the upper portion of superior vena cava, unchanged. IMPRESSION: 1. Minimal blunting of the right lateral costophrenic angle, improved since the prior study. Electronically Signed   By: Jules Schick M.D.   On: 10/29/2022 12:58     Medications:     Scheduled Medications:  atorvastatin  40 mg Oral Daily   furosemide  80 mg Intravenous BID   gabapentin  100 mg Oral BID   influenza vaccine adjuvanted  0.5 mL Intramuscular Tomorrow-1000   insulin aspart  0-9 Units Subcutaneous TID WC   loratadine  10 mg Oral Daily   metoprolol succinate  25 mg Oral Daily   pantoprazole  40 mg Oral BID   pneumococcal 20-valent conjugate vaccine  0.5 mL Intramuscular Tomorrow-1000   potassium chloride  40 mEq Oral BID   sacubitril-valsartan  1 tablet Oral BID   sodium chloride flush  3 mL Intravenous Q12H   sotalol  120 mg Oral Q12H   spironolactone  25 mg Oral Daily    Infusions:  sodium chloride     sodium chloride 10 mL/hr at 10/30/22 0451    PRN Medications: sodium chloride, acetaminophen, ondansetron (ZOFRAN) IV, sodium chloride flush    Patient Profile   Jon Williams  is a 72 year old with a history of chronic systolic/diastolic heart failure, St Jude ICD, persistent atrial fibrillation, PAF, CAD, DMII, LBBB, HTN, gastric cancer 2022, and prostate cancer 2019. Admitted with A/C HFrEF.   Assessment/Plan  1. Acute on chronic systolic CHF: Long-standing cardiomyopathy, thought to be nonischemic.  St Jude CRT-D device.  Cath in 2018 with nonobstructive CAD but CI at that time was low at 1.9.  Most recent echo was in 8/23 showing EF 20-25%, mild RV dysfunction, moderate-severe Jon.  Lasix was cut back earlier  this summer.  He presents with about a week of orthopnea and exertional dyspnea.  Weight up about 5 lbs at home and BNP significantly elevated at 1605.  Worrisomely, his extremities are cool and he has a history of low output HF (RHC in 2018).   - Volume improving but still moderately elevated. Continue Lasix 80 mg IV bid. Can better gauge after RHC.  - Can continue home Entresto 24/26 bid.  - Continue spironolactone 25 daily - Continue Jardiance 10 daily - Continue low dose Toprol XL.  - Repeat echo later today - Plan for RHC/LHC to assess filling pressures and cardiac output (?low output HF).  Would repeat coronary evaluation given moderate nonobstructive disease on prior cath in 2018.  2. Atrial fibrillation: Looking back at device interrogations, this looks permanent.   - Hold Xarelto for cath, can restart after - If AF is permanent, think we should stop sotalol unless he has had VT.  Will discuss with Dr. Ladona Ridgel.  3. Gastric cancer: Treated, now being monitored.  4. Type 2 diabetes: Hold metformin for cath.   Length of Stay: 1  Jon Bleacher, NP  10/30/2022, 8:54 AM  Advanced Heart Failure Team Pager [phone] (M-F; 7a - 5p)  Please contact [facility] for night-coverage after hours (5p -7a ) and weekends on [email or web address]   Patient seen with NP, agree with the above note.    Excellent response to IV Lasix, I/Os net negative 4L.   Cath today: Coronaries: Mild nonobstructive CAD with myocardial bridging in the distal LAD.  Right Heart  Right Heart Pressures RHC Procedural Findings: Hemodynamics (mmHg) RA mean 1 RV 26/1 PA 28/10 mean 17 PCWP mean 8 LV 90/6 AO 94/58  Oxygen saturations: PA 99% AO 63%  Cardiac Output (Fick) 4.15  Cardiac Index (Fick) 2.16   Cardiac Output (Thermo) 3.54  Cardiac Index (Thermo) 1.84   General: NAD Neck: No JVD, no thyromegaly or thyroid nodule.  Lungs: Clear to auscultation bilaterally with normal respiratory effort. CV: Nondisplaced  PMI.  Heart regular S1/S2, no S3/S4, no murmur.  No peripheral edema.   Abdomen: Soft, nontender, no hepatosplenomegaly, no distention.  Skin: Intact without lesions or rashes.  Neurologic: Alert and oriented x 3.  Psych: Normal affect. Extremities: No clubbing or cyanosis.  HEENT: Normal.   1. Acute on chronic systolic CHF: Long-standing cardiomyopathy, thought to be nonischemic.  St Jude CRT-D device.  Cath in 2018 with nonobstructive CAD but CI at that time was low at 1.9.  Most recent echo was in 8/23 showing EF 20-25%, mild RV dysfunction, moderate-severe Jon.  Lasix was cut back earlier this summer.  He presented with about a week of orthopnea and exertional dyspnea.  Weight up about 5 lbs at home and BNP significantly elevated at 1605.  Worrisomely, his extremities are cool and he has a history of low output HF (RHC in 2018).  Patient diuresed well with IV Lasix, I/Os net negative  4L.  RHC/LHC today showed nonobstructive CAD, normal filling pressures, and low cardiac index.  - Stop Lasix. To po diuretic tomorrow.  - Needs repeat echo today.  - If device is compatible, will try to get cardiac MRI => cannot get, device not compatible.  - Can continue home Entresto 24/26 bid, no BP room to increase.  - With low output on RHC today, will start digoxin 0.125 daily.  - Continue spironolactone 25 daily - Continue Jardiance 10 daily - Continue low dose Toprol XL.  - He feels great today and filling pressures normal on RHC.  However, I worry about him long-term with low output HF (Fick CI 2.16, thermo 1.84).  I will not start him on inotropes as he clinically looks good, but will need very close followup of his clinical course in CHF clinic.  I would like to get CPX soon. We discussed potential need for LVAD, I think he would be a good candidate.  2. Atrial fibrillation: Looking back at device interrogations, this looks permanent.   - Restart Xarelto this evening.  - Discussed with Dr. Ladona Ridgel, will  stop sotalol with permanent AF.  3. Gastric cancer: Treated, now being monitored.  4. Type 2 diabetes: Held metformin for cath.   Will get echo and ?MRI today, monitor on meds.  Home tomorrow.   Jon Williams 10/30/2022 11:46 AM

## 2022-10-30 NOTE — Consult Note (Signed)
Value-Based Care Institute   [facility] Inpatient Consult   10/30/2022  [name] [birth year] [id number]  [facility] [[facility]]  Accountable Care Organization [ACO] Patient:  Jon Williams  Primary Care Provider:  Renford Dills, MD is an [place] provider at [facility]   The patient was screened for hospitalization with noted high risk score for unplanned readmission risk and is with noted for procedure .  The patient was assessed for potential [facility]) Care Management service needs for post hospital transition for care coordination. Review of patient's electronic medical record reveals patient is admitted from home with spouse. Patient being followed by HF team on rounds.  Plan: [facility] Liaison will continue to follow progress and disposition to asess for post hospital community care coordination/management needs.  Referral request for community care coordination: anticipate [facility] team for follow up, will follow for any changes   [facility] Care Management/Population Health does not replace or interfere with any arrangements made by the Inpatient Transition of Care team.   For questions contact:   Charlesetta Shanks, RN, BSN, CCM [place]  [facility] Liaison Direct Dial: [phone] or secure chat Website: [name]

## 2022-10-30 NOTE — Progress Notes (Signed)
[facility] EDUCATION NOTE:                VAD evaluation information reviewed by Jon Williams and designated caregiver Jon Williams. Brief initial VAD teaching completed with pt and caregiver.   VAD educational packet including "Understanding Your Options with Advanced Heart Failure", "[place] Patient Agreement for VAD Evaluation and Potential Implantation" consent, and [employer] "Heartmate 3 Left Ventricular Device (LVAD) Patient Guide", "[place] HM III Patient Education", "[place] Mechanical Circulatory Support Program", and "Decision Aids for Left Ventricular Assist Device" reviewed in detail and left at bedside for continued reference.     All questions answered regarding VAD implant, hospital stay, and what to expect when discharged home living with a heart pump. Explained need for 24/7 care when pt is discharged home due to sternal precautions, adaptation to living on support, emotional support, consistent and meticulous exit site care and management, medication adherence and high volume of follow up visits with the VAD Clinic after discharge; both pt and caregiver verbalized understanding of above.   Extended the option to have one of our current patients and caregiver(s) come to talk with them about living on support to assist with decision making if pt wishes to proceed with / is deemed an acceptable VAD evaluation candidate in the future.    Intermacs patient survival statistics through March 2024 reviewed with patient and caregiver as follows:    The patient understands that from this discussion it does not mean that they will receive the device, but that depends on an extensive evaluation process. The patient is aware of the fact that if at anytime they want to stop the evaluation process they can.  All questions have been answered at this time and contact information was provided should they encounter any further questions. At this time both Jon Williams would like time to review  the above materials and have a private discussion regarding potentially moving forward with evaluation in the future.   Discussed the above with Dr Shirlee Latch. VAD team will continue to follow for appropriateness to begin evaluation.   Jon Pagan RN VAD Coordinator  Office: [phone]  24/7 Pager: [phone]

## 2022-10-31 ENCOUNTER — Inpatient Hospital Stay (HOSPITAL_COMMUNITY): Payer: Medicare HMO

## 2022-10-31 ENCOUNTER — Other Ambulatory Visit (HOSPITAL_COMMUNITY): Payer: Self-pay

## 2022-10-31 ENCOUNTER — Encounter (HOSPITAL_COMMUNITY): Payer: Self-pay | Admitting: Cardiology

## 2022-10-31 DIAGNOSIS — I5023 Acute on chronic systolic (congestive) heart failure: Secondary | ICD-10-CM | POA: Diagnosis not present

## 2022-10-31 LAB — GLUCOSE, CAPILLARY: Glucose-Capillary: 100 mg/dL — ABNORMAL HIGH (ref 70–99)

## 2022-10-31 MED ORDER — FUROSEMIDE 40 MG PO TABS
40.0000 mg | ORAL_TABLET | Freq: Every day | ORAL | Status: DC
  Administered 2022-10-31: 40 mg via ORAL
  Filled 2022-10-31: qty 1

## 2022-10-31 MED ORDER — FUROSEMIDE 40 MG PO TABS
40.0000 mg | ORAL_TABLET | Freq: Every day | ORAL | 6 refills | Status: AC
Start: 2022-10-31 — End: ?
  Filled 2022-10-31: qty 30, 30d supply, fill #0

## 2022-10-31 MED ORDER — PERFLUTREN LIPID MICROSPHERE
1.0000 mL | INTRAVENOUS | Status: AC | PRN
  Administered 2022-10-31: 4 mL via INTRAVENOUS

## 2022-10-31 MED ORDER — DIGOXIN 125 MCG PO TABS
0.1250 mg | ORAL_TABLET | Freq: Every day | ORAL | 6 refills | Status: DC
  Filled 2022-10-31: qty 30, 30d supply, fill #0

## 2022-10-31 MED ORDER — POTASSIUM CHLORIDE ER 10 MEQ PO TBCR: 20.0000 meq | EXTENDED_RELEASE_TABLET | Freq: Every day | ORAL | 6 refills | Status: DC

## 2022-10-31 NOTE — Plan of Care (Signed)
Problem: Education: Goal: Understanding of CV disease, CV risk reduction, and recovery process will improve Outcome: Adequate for Discharge Goal: Individualized Educational Video(s) Outcome: Adequate for Discharge   Problem: Activity: Goal: Ability to return to baseline activity level will improve Outcome: Adequate for Discharge   Problem: Cardiovascular: Goal: Ability to achieve and maintain adequate cardiovascular perfusion will improve Outcome: Adequate for Discharge Goal: Vascular access site(s) Level 0-1 will be maintained Outcome: Adequate for Discharge   Problem: Health Behavior/Discharge Planning: Goal: Ability to safely manage health-related needs after discharge will improve Outcome: Adequate for Discharge   Problem: Education: Goal: Ability to describe self-care measures that may prevent or decrease complications (Diabetes Survival Skills Education) will improve Outcome: Adequate for Discharge Goal: Individualized Educational Video(s) Outcome: Adequate for Discharge   Problem: Coping: Goal: Ability to adjust to condition or change in health will improve Outcome: Adequate for Discharge   Problem: Fluid Volume: Goal: Ability to maintain a balanced intake and output will improve Outcome: Adequate for Discharge   Problem: Health Behavior/Discharge Planning: Goal: Ability to identify and utilize available resources and services will improve Outcome: Adequate for Discharge Goal: Ability to manage health-related needs will improve Outcome: Adequate for Discharge   Problem: Metabolic: Goal: Ability to maintain appropriate glucose levels will improve Outcome: Adequate for Discharge   Problem: Nutritional: Goal: Maintenance of adequate nutrition will improve Outcome: Adequate for Discharge Goal: Progress toward achieving an optimal weight will improve Outcome: Adequate for Discharge   Problem: Skin Integrity: Goal: Risk for impaired skin integrity will  decrease Outcome: Adequate for Discharge   Problem: Tissue Perfusion: Goal: Adequacy of tissue perfusion will improve Outcome: Adequate for Discharge   Problem: Education: Goal: Knowledge of General Education information will improve Description: Including pain rating scale, medication(s)/side effects and non-pharmacologic comfort measures Outcome: Adequate for Discharge   Problem: Health Behavior/Discharge Planning: Goal: Ability to manage health-related needs will improve Outcome: Adequate for Discharge   Problem: Clinical Measurements: Goal: Ability to maintain clinical measurements within normal limits will improve Outcome: Adequate for Discharge Goal: Will remain free from infection Outcome: Adequate for Discharge Goal: Diagnostic test results will improve Outcome: Adequate for Discharge Goal: Respiratory complications will improve Outcome: Adequate for Discharge Goal: Cardiovascular complication will be avoided Outcome: Adequate for Discharge   Problem: Activity: Goal: Risk for activity intolerance will decrease Outcome: Adequate for Discharge   Problem: Nutrition: Goal: Adequate nutrition will be maintained Outcome: Adequate for Discharge   Problem: Coping: Goal: Level of anxiety will decrease Outcome: Adequate for Discharge   Problem: Elimination: Goal: Will not experience complications related to bowel motility Outcome: Adequate for Discharge Goal: Will not experience complications related to urinary retention Outcome: Adequate for Discharge   Problem: Pain Managment: Goal: General experience of comfort will improve Outcome: Adequate for Discharge   Problem: Safety: Goal: Ability to remain free from injury will improve Outcome: Adequate for Discharge   Problem: Skin Integrity: Goal: Risk for impaired skin integrity will decrease Outcome: Adequate for Discharge

## 2022-10-31 NOTE — TOC Progression Note (Signed)
Transition of Care [facility]) - Progression Note    Patient Details  Name: Jon Williams MRN: [id number] Date of Birth: [birth year]  [facility] CM/SW Contact  Elliot Cousin, RN Phone Number: [phone] 10/31/2022, 11:21 AM  Clinical Narrative:  Wife will provide transportation home. Spoke to pt and states he was independent pta. Pt states he adheres to a low sodium/heart healthy diet. Does daily weights. Meds to come up from [facility] pharmacy.      Expected Discharge Plan: Home/Self Care Barriers to Discharge: No Barriers Identified  Expected Discharge Plan and Services   Discharge Planning Services: CM Consult   Living arrangements for the past 2 months: Single Family Home Expected Discharge Date: 10/31/22                                     Social Determinants of Health (SDOH) Interventions SDOH Screenings   Food Insecurity: No Food Insecurity (10/29/2022)  Housing: Low Risk  (10/29/2022)  Transportation Needs: No Transportation Needs (10/29/2022)  Utilities: Not At Risk (10/29/2022)  Alcohol Screen: Low Risk  (09/26/2021)  Depression (PHQ2-9): Low Risk  (12/03/2019)  Financial Resource Strain: Low Risk  (12/26/2021)  Physical Activity: Insufficiently Active (09/26/2021)  Stress: No Stress Concern Present (09/26/2021)  Tobacco Use: Low Risk  (10/29/2022)    Readmission Risk Interventions    12/27/2021   10:50 AM 09/15/2021    2:29 PM 04/04/2021   12:46 PM  Readmission Risk Prevention Plan  Transportation Screening Complete Complete Complete  PCP or Specialist Appt within 3-5 Days Complete Complete   HRI or Home Care Consult Complete Complete   Social Work Consult for Recovery Care Planning/Counseling Complete Complete   Palliative Care Screening Not Applicable Not Applicable   Medication Review Oceanographer) Complete Complete Complete  PCP or Specialist appointment within 3-5 days of discharge   Complete  HRI or Home Care Consult   Complete   SW Recovery Care/Counseling Consult   Complete  Palliative Care Screening   Not Applicable  Skilled Nursing Facility   Not Applicable

## 2022-10-31 NOTE — Progress Notes (Signed)
Mobility Specialist Progress Note:    10/31/22 1155  Mobility  Activity Ambulated with assistance in hallway  Level of Assistance Contact guard assist, steadying assist  Assistive Device None  Distance Ambulated (ft) 500 ft  Activity Response Tolerated well  Mobility Referral Yes  $Mobility charge 1 Mobility  Mobility Specialist Start Time (ACUTE ONLY) 1115  Mobility Specialist Stop Time (ACUTE ONLY) 1128  Mobility Specialist Time Calculation (min) (ACUTE ONLY) 13 min   Received pt seated EOb having no complaints and agreeable to mobility. Pt was asymptomatic throughout ambulation and returned to room w/o fault. Left sitting EOB w/ call bell in reach and all needs met.   Thompson Grayer Mobility Specialist  Please contact vis Secure Chat or  Rehab Office [phone]

## 2022-10-31 NOTE — Discharge Summary (Signed)
Result Date: 10/31/2022    ECHOCARDIOGRAM REPORT   Patient Name:   Jon Williams Date of Exam: 10/31/2022 Medical Rec #:  [id number]        Height:       66.0 in Accession #:    [id number]       Weight:       183.0 lb Date of Birth:  08-27-1950         BSA:          1.926 m Patient Age:    72 years         BP:           108/79 mmHg Patient Gender: M                HR:           60 bpm. Exam Location:  Inpatient Procedure: 2D Echo, Cardiac Doppler, Color Doppler and Intracardiac            Opacification Agent Indications:    Congestive Heart Failure I50.9  History:        Patient has prior history of Echocardiogram examinations, most                  recent 09/13/2021. CHF and Cardiomyopathy, CAD, Defibrillator and                 Pacemaker, Pulmonary HTN, Arrythmias:LBBB; Risk                 Factors:Non-Smoker, Hypertension, Dyslipidemia and Diabetes.  Sonographer:    Aron Baba Referring Phys: [phone] Jon Williams D Jon Williams IMPRESSIONS  1. There is no left ventricular thrombus (Definity contrast was used). Left ventricular ejection fraction, by estimation, is 10-15%. The left ventricle has severely decreased function. The left ventricle demonstrates global hypokinesis. The left ventricular internal cavity size was severely dilated. Left ventricular diastolic parameters are consistent with Grade III diastolic dysfunction (restrictive).  2. Right ventricular systolic function is mildly reduced. The right ventricular size is moderately enlarged. There is normal pulmonary artery systolic pressure.  3. Left atrial size was severely dilated.  4. Right atrial size was severely dilated.  5. The mitral valve is normal in structure. Moderate to severe mitral valve regurgitation. No evidence of mitral stenosis.  6. The aortic valve is tricuspid. Aortic valve regurgitation is not visualized. No aortic stenosis is present.  7. The inferior vena cava is dilated in size with >50% respiratory variability, suggesting right atrial pressure of 8 mmHg. Comparison(s): The left ventricular function is worsened. This may be due to worsened systolic dyssynchrony (is there a new IVCD on ECG?). FINDINGS  Left Ventricle: There is no left ventricular thrombus (Definity contrast was used). Left ventricular ejection fraction, by estimation, is 10-15%. The left ventricle has severely decreased function. The left ventricle demonstrates global hypokinesis. Definity contrast agent was given IV to delineate the left ventricular endocardial borders. The left ventricular internal cavity size was severely dilated. There is no left ventricular hypertrophy. Abnormal (paradoxical) septal motion,  consistent with left bundle branch block. Left ventricular diastolic parameters are consistent with Grade III diastolic dysfunction (restrictive). Right Ventricle: The right ventricular size is moderately enlarged. No increase in right ventricular wall thickness. Right ventricular systolic function is mildly reduced. There is normal pulmonary artery systolic pressure. The tricuspid regurgitant velocity is 2.35 m/s, and with an assumed right atrial pressure of 8 mmHg, the estimated right ventricular systolic pressure is 30.1 mmHg. Left Atrium: Left atrial size  Result Date: 10/31/2022    ECHOCARDIOGRAM REPORT   Patient Name:   Jon Williams Date of Exam: 10/31/2022 Medical Rec #:  [id number]        Height:       66.0 in Accession #:    [id number]       Weight:       183.0 lb Date of Birth:  08-27-1950         BSA:          1.926 m Patient Age:    72 years         BP:           108/79 mmHg Patient Gender: M                HR:           60 bpm. Exam Location:  Inpatient Procedure: 2D Echo, Cardiac Doppler, Color Doppler and Intracardiac            Opacification Agent Indications:    Congestive Heart Failure I50.9  History:        Patient has prior history of Echocardiogram examinations, most                  recent 09/13/2021. CHF and Cardiomyopathy, CAD, Defibrillator and                 Pacemaker, Pulmonary HTN, Arrythmias:LBBB; Risk                 Factors:Non-Smoker, Hypertension, Dyslipidemia and Diabetes.  Sonographer:    Aron Baba Referring Phys: [phone] Jon Williams D Jon Williams IMPRESSIONS  1. There is no left ventricular thrombus (Definity contrast was used). Left ventricular ejection fraction, by estimation, is 10-15%. The left ventricle has severely decreased function. The left ventricle demonstrates global hypokinesis. The left ventricular internal cavity size was severely dilated. Left ventricular diastolic parameters are consistent with Grade III diastolic dysfunction (restrictive).  2. Right ventricular systolic function is mildly reduced. The right ventricular size is moderately enlarged. There is normal pulmonary artery systolic pressure.  3. Left atrial size was severely dilated.  4. Right atrial size was severely dilated.  5. The mitral valve is normal in structure. Moderate to severe mitral valve regurgitation. No evidence of mitral stenosis.  6. The aortic valve is tricuspid. Aortic valve regurgitation is not visualized. No aortic stenosis is present.  7. The inferior vena cava is dilated in size with >50% respiratory variability, suggesting right atrial pressure of 8 mmHg. Comparison(s): The left ventricular function is worsened. This may be due to worsened systolic dyssynchrony (is there a new IVCD on ECG?). FINDINGS  Left Ventricle: There is no left ventricular thrombus (Definity contrast was used). Left ventricular ejection fraction, by estimation, is 10-15%. The left ventricle has severely decreased function. The left ventricle demonstrates global hypokinesis. Definity contrast agent was given IV to delineate the left ventricular endocardial borders. The left ventricular internal cavity size was severely dilated. There is no left ventricular hypertrophy. Abnormal (paradoxical) septal motion,  consistent with left bundle branch block. Left ventricular diastolic parameters are consistent with Grade III diastolic dysfunction (restrictive). Right Ventricle: The right ventricular size is moderately enlarged. No increase in right ventricular wall thickness. Right ventricular systolic function is mildly reduced. There is normal pulmonary artery systolic pressure. The tricuspid regurgitant velocity is 2.35 m/s, and with an assumed right atrial pressure of 8 mmHg, the estimated right ventricular systolic pressure is 30.1 mmHg. Left Atrium: Left atrial size  Advanced Heart Failure Team  Discharge Summary   Patient ID: Jon Williams MRN: [id number], DOB/AGE: 31-Aug-1950 72 y.o. Admit date: 10/29/2022 D/C date:     10/31/2022   Primary Discharge Diagnoses:   1. Acute on chronic systolic CHF  NICM EF 10-15%  2. Atrial fibrillation, Permanent  Xarelto   3. Gastric Cancer   4. Type 2 diabetes   Hospital Course:  Mr Suderman is a 72 year old with a history of chronic systolic/diastolic heart failure, St Jude ICD, persistent atrial fibrillation, PAF, CAD, DMII, LBBB, HTN, gastric cancer 2022, and prostate cancer 2019.   Admitted with A/C HFrEF. Diuresed with IV lasix and transitioned to po lasix 40 mg daily. Had cath that showed non obstructive CAD, normal filling pressures and low cardiac index. Digoxin started. GDMT continued with 4 pillars of HF. Echo showed severely reduced EF. LVAD discussed and LVAD coordinators met with him. For now we are holding off on full VAD work up.   He has been in A fib for some time. Discussed with Dr Ladona Ridgel. Sotalol was stopped. Continue low dose Toprol XL and Xarelto.   See below for detailed problems list. He will contine to be followed closely in the HF clinic.   1. Acute on chronic systolic CHF: Long-standing cardiomyopathy, thought to be nonischemic.  St Jude CRT-D device.  Cath in 2018 with nonobstructive CAD but CI at that time was low at 1.9.  Most recent echo was in 8/23 showing EF 20-25%, mild RV dysfunction, moderate-severe MR.  Lasix was cut back earlier this summer.  He presents with about a week of orthopnea and exertional dyspnea.  Weight up about 5 lbs at home and BNP significantly elevated at 1605.  Worrisomely, his extremities are cool and he has a history of low output HF (RHC in 2018).   - RHC/LHC showed nonobstructive CAD, normal filling pressures, and low cardiac index.  - Diuresed with IV lasix and transitioned to po lasix 40 mg daily.  - Continue home Entresto 24/26 bid.  - Continue  spironolactone 25 daily - Continue Jardiance 10 daily - Continue low dose Toprol XL.  2. Atrial fibrillation: Looking back at device interrogations, this looks permanent.   - Rate controlled. Continue xarelto.  - Sotalol stopped. Can reconsider if has VT.  Will discuss with Dr. Ladona Ridgel.  3. Gastric cancer: Treated, now being monitored.  4. Type 2 diabetes: Restart home meds.  Discharge Vitals: Blood pressure 108/79, pulse 65, temperature 97.8 F (36.6 C), temperature source Oral, resp. rate 18, height 5\' 6"  (1.676 m), weight 83 kg, SpO2 100%.  Labs: Lab Results  Component Value Date   WBC 6.6 10/29/2022   HGB 13.3 10/30/2022   HGB 12.9 (L) 10/30/2022   HCT 39.0 10/30/2022   HCT 38.0 (L) 10/30/2022   MCV 102.3 (H) 10/29/2022   PLT 163 10/29/2022    Recent Labs  Lab 10/31/22 0449  NA 137  K 3.7  CL 105  CO2 24  BUN 22  CREATININE 1.13  CALCIUM 8.4*  GLUCOSE 108*   Lab Results  Component Value Date   CHOL 132 06/06/2016   HDL 60 06/06/2016   LDLCALC 61 06/06/2016   TRIG 53 06/06/2016   BNP (last 3 results) Recent Labs    12/25/21 1145 01/09/22 1142 10/29/22 1102  BNP 2,219.4* 629.3* 1,605.5*    ProBNP (last 3 results) No results for input(s): "PROBNP" in the last 8760 hours.   Diagnostic Studies/Procedures   ECHOCARDIOGRAM COMPLETE  Result Date: 10/31/2022    ECHOCARDIOGRAM REPORT   Patient Name:   Jon Williams Date of Exam: 10/31/2022 Medical Rec #:  [id number]        Height:       66.0 in Accession #:    [id number]       Weight:       183.0 lb Date of Birth:  08-27-1950         BSA:          1.926 m Patient Age:    72 years         BP:           108/79 mmHg Patient Gender: M                HR:           60 bpm. Exam Location:  Inpatient Procedure: 2D Echo, Cardiac Doppler, Color Doppler and Intracardiac            Opacification Agent Indications:    Congestive Heart Failure I50.9  History:        Patient has prior history of Echocardiogram examinations, most                  recent 09/13/2021. CHF and Cardiomyopathy, CAD, Defibrillator and                 Pacemaker, Pulmonary HTN, Arrythmias:LBBB; Risk                 Factors:Non-Smoker, Hypertension, Dyslipidemia and Diabetes.  Sonographer:    Aron Baba Referring Phys: [phone] Jon Williams D Jon Williams IMPRESSIONS  1. There is no left ventricular thrombus (Definity contrast was used). Left ventricular ejection fraction, by estimation, is 10-15%. The left ventricle has severely decreased function. The left ventricle demonstrates global hypokinesis. The left ventricular internal cavity size was severely dilated. Left ventricular diastolic parameters are consistent with Grade III diastolic dysfunction (restrictive).  2. Right ventricular systolic function is mildly reduced. The right ventricular size is moderately enlarged. There is normal pulmonary artery systolic pressure.  3. Left atrial size was severely dilated.  4. Right atrial size was severely dilated.  5. The mitral valve is normal in structure. Moderate to severe mitral valve regurgitation. No evidence of mitral stenosis.  6. The aortic valve is tricuspid. Aortic valve regurgitation is not visualized. No aortic stenosis is present.  7. The inferior vena cava is dilated in size with >50% respiratory variability, suggesting right atrial pressure of 8 mmHg. Comparison(s): The left ventricular function is worsened. This may be due to worsened systolic dyssynchrony (is there a new IVCD on ECG?). FINDINGS  Left Ventricle: There is no left ventricular thrombus (Definity contrast was used). Left ventricular ejection fraction, by estimation, is 10-15%. The left ventricle has severely decreased function. The left ventricle demonstrates global hypokinesis. Definity contrast agent was given IV to delineate the left ventricular endocardial borders. The left ventricular internal cavity size was severely dilated. There is no left ventricular hypertrophy. Abnormal (paradoxical) septal motion,  consistent with left bundle branch block. Left ventricular diastolic parameters are consistent with Grade III diastolic dysfunction (restrictive). Right Ventricle: The right ventricular size is moderately enlarged. No increase in right ventricular wall thickness. Right ventricular systolic function is mildly reduced. There is normal pulmonary artery systolic pressure. The tricuspid regurgitant velocity is 2.35 m/s, and with an assumed right atrial pressure of 8 mmHg, the estimated right ventricular systolic pressure is 30.1 mmHg. Left Atrium: Left atrial size  Result Date: 10/31/2022    ECHOCARDIOGRAM REPORT   Patient Name:   Jon Williams Date of Exam: 10/31/2022 Medical Rec #:  [id number]        Height:       66.0 in Accession #:    [id number]       Weight:       183.0 lb Date of Birth:  08-27-1950         BSA:          1.926 m Patient Age:    72 years         BP:           108/79 mmHg Patient Gender: M                HR:           60 bpm. Exam Location:  Inpatient Procedure: 2D Echo, Cardiac Doppler, Color Doppler and Intracardiac            Opacification Agent Indications:    Congestive Heart Failure I50.9  History:        Patient has prior history of Echocardiogram examinations, most                  recent 09/13/2021. CHF and Cardiomyopathy, CAD, Defibrillator and                 Pacemaker, Pulmonary HTN, Arrythmias:LBBB; Risk                 Factors:Non-Smoker, Hypertension, Dyslipidemia and Diabetes.  Sonographer:    Aron Baba Referring Phys: [phone] Jon Williams D Jon Williams IMPRESSIONS  1. There is no left ventricular thrombus (Definity contrast was used). Left ventricular ejection fraction, by estimation, is 10-15%. The left ventricle has severely decreased function. The left ventricle demonstrates global hypokinesis. The left ventricular internal cavity size was severely dilated. Left ventricular diastolic parameters are consistent with Grade III diastolic dysfunction (restrictive).  2. Right ventricular systolic function is mildly reduced. The right ventricular size is moderately enlarged. There is normal pulmonary artery systolic pressure.  3. Left atrial size was severely dilated.  4. Right atrial size was severely dilated.  5. The mitral valve is normal in structure. Moderate to severe mitral valve regurgitation. No evidence of mitral stenosis.  6. The aortic valve is tricuspid. Aortic valve regurgitation is not visualized. No aortic stenosis is present.  7. The inferior vena cava is dilated in size with >50% respiratory variability, suggesting right atrial pressure of 8 mmHg. Comparison(s): The left ventricular function is worsened. This may be due to worsened systolic dyssynchrony (is there a new IVCD on ECG?). FINDINGS  Left Ventricle: There is no left ventricular thrombus (Definity contrast was used). Left ventricular ejection fraction, by estimation, is 10-15%. The left ventricle has severely decreased function. The left ventricle demonstrates global hypokinesis. Definity contrast agent was given IV to delineate the left ventricular endocardial borders. The left ventricular internal cavity size was severely dilated. There is no left ventricular hypertrophy. Abnormal (paradoxical) septal motion,  consistent with left bundle branch block. Left ventricular diastolic parameters are consistent with Grade III diastolic dysfunction (restrictive). Right Ventricle: The right ventricular size is moderately enlarged. No increase in right ventricular wall thickness. Right ventricular systolic function is mildly reduced. There is normal pulmonary artery systolic pressure. The tricuspid regurgitant velocity is 2.35 m/s, and with an assumed right atrial pressure of 8 mmHg, the estimated right ventricular systolic pressure is 30.1 mmHg. Left Atrium: Left atrial size  Advanced Heart Failure Team  Discharge Summary   Patient ID: Jon Williams MRN: [id number], DOB/AGE: 31-Aug-1950 72 y.o. Admit date: 10/29/2022 D/C date:     10/31/2022   Primary Discharge Diagnoses:   1. Acute on chronic systolic CHF  NICM EF 10-15%  2. Atrial fibrillation, Permanent  Xarelto   3. Gastric Cancer   4. Type 2 diabetes   Hospital Course:  Mr Suderman is a 72 year old with a history of chronic systolic/diastolic heart failure, St Jude ICD, persistent atrial fibrillation, PAF, CAD, DMII, LBBB, HTN, gastric cancer 2022, and prostate cancer 2019.   Admitted with A/C HFrEF. Diuresed with IV lasix and transitioned to po lasix 40 mg daily. Had cath that showed non obstructive CAD, normal filling pressures and low cardiac index. Digoxin started. GDMT continued with 4 pillars of HF. Echo showed severely reduced EF. LVAD discussed and LVAD coordinators met with him. For now we are holding off on full VAD work up.   He has been in A fib for some time. Discussed with Dr Ladona Ridgel. Sotalol was stopped. Continue low dose Toprol XL and Xarelto.   See below for detailed problems list. He will contine to be followed closely in the HF clinic.   1. Acute on chronic systolic CHF: Long-standing cardiomyopathy, thought to be nonischemic.  St Jude CRT-D device.  Cath in 2018 with nonobstructive CAD but CI at that time was low at 1.9.  Most recent echo was in 8/23 showing EF 20-25%, mild RV dysfunction, moderate-severe MR.  Lasix was cut back earlier this summer.  He presents with about a week of orthopnea and exertional dyspnea.  Weight up about 5 lbs at home and BNP significantly elevated at 1605.  Worrisomely, his extremities are cool and he has a history of low output HF (RHC in 2018).   - RHC/LHC showed nonobstructive CAD, normal filling pressures, and low cardiac index.  - Diuresed with IV lasix and transitioned to po lasix 40 mg daily.  - Continue home Entresto 24/26 bid.  - Continue  spironolactone 25 daily - Continue Jardiance 10 daily - Continue low dose Toprol XL.  2. Atrial fibrillation: Looking back at device interrogations, this looks permanent.   - Rate controlled. Continue xarelto.  - Sotalol stopped. Can reconsider if has VT.  Will discuss with Dr. Ladona Ridgel.  3. Gastric cancer: Treated, now being monitored.  4. Type 2 diabetes: Restart home meds.  Discharge Vitals: Blood pressure 108/79, pulse 65, temperature 97.8 F (36.6 C), temperature source Oral, resp. rate 18, height 5\' 6"  (1.676 m), weight 83 kg, SpO2 100%.  Labs: Lab Results  Component Value Date   WBC 6.6 10/29/2022   HGB 13.3 10/30/2022   HGB 12.9 (L) 10/30/2022   HCT 39.0 10/30/2022   HCT 38.0 (L) 10/30/2022   MCV 102.3 (H) 10/29/2022   PLT 163 10/29/2022    Recent Labs  Lab 10/31/22 0449  NA 137  K 3.7  CL 105  CO2 24  BUN 22  CREATININE 1.13  CALCIUM 8.4*  GLUCOSE 108*   Lab Results  Component Value Date   CHOL 132 06/06/2016   HDL 60 06/06/2016   LDLCALC 61 06/06/2016   TRIG 53 06/06/2016   BNP (last 3 results) Recent Labs    12/25/21 1145 01/09/22 1142 10/29/22 1102  BNP 2,219.4* 629.3* 1,605.5*    ProBNP (last 3 results) No results for input(s): "PROBNP" in the last 8760 hours.   Diagnostic Studies/Procedures   ECHOCARDIOGRAM COMPLETE

## 2022-10-31 NOTE — Progress Notes (Addendum)
Advanced Heart Failure Rounding Note  PCP-Cardiologist: Kristeen Miss, MD   Subjective:   RHC/LHC showed nonobstructive CAD, normal filling pressures, and low cardiac index.    Feels good. Wants to go home.   Objective:   Weight Range: 83 kg Body mass index is 29.53 kg/m.   Vital Signs:   Temp:  [97.8 F (36.6 C)-98.1 F (36.7 C)] 97.8 F (36.6 C) (09/18 0745) Pulse Rate:  [60-84] 65 (09/18 0745) Resp:  [11-28] 18 (09/18 0745) BP: (92-125)/(67-91) 108/79 (09/18 0745) SpO2:  [94 %-100 %] 100 % (09/18 0745) Weight:  [83 kg] 83 kg (09/18 0708) Last BM Date : 10/29/22  Weight change: Filed Weights   10/29/22 1832 10/30/22 0134 10/31/22 0708  Weight: 83.7 kg 82.7 kg 83 kg    Intake/Output:   Intake/Output Summary (Last 24 hours) at 10/31/2022 1021 Last data filed at 10/31/2022 0700 Gross per 24 hour  Intake 240 ml  Output 500 ml  Net -260 ml     Physical Exam   General:  Well appearing. No resp difficulty HEENT: normal Neck: supple. no JVD. Carotids 2+ bilat; no bruits. No lymphadenopathy or thryomegaly appreciated. Cor: PMI nondisplaced. Regular rate & rhythm. No rubs, gallops or murmurs. Lungs: clear Abdomen: soft, nontender, nondistended. No hepatosplenomegaly. No bruits or masses. Good bowel sounds. Extremities: no cyanosis, clubbing, rash, edema Neuro: alert & orientedx3, cranial nerves grossly intact. moves all 4 extremities w/o difficulty. Affect pleasant   Telemetry  V Paced 60s   EKG    No new EKG to review  Labs    CBC Recent Labs    10/29/22 1102 10/30/22 1049  WBC 6.6  --   HGB 12.6* 12.9*  13.3  HCT 39.9 38.0*  39.0  MCV 102.3*  --   PLT 163  --    Basic Metabolic Panel Recent Labs    16/10/96 0246 10/30/22 1049 10/31/22 0449  NA 137 134*  138 137  K 4.7 3.4*  3.5 3.7  CL 104  --  105  CO2 19*  --  24  GLUCOSE 96  --  108*  BUN 24*  --  22  CREATININE 1.37*  --  1.13  CALCIUM 9.0  --  8.4*  MG 2.0  --   --     Liver Function Tests No results for input(s): "AST", "ALT", "ALKPHOS", "BILITOT", "PROT", "ALBUMIN" in the last 72 hours. No results for input(s): "LIPASE", "AMYLASE" in the last 72 hours. Cardiac Enzymes No results for input(s): "CKTOTAL", "CKMB", "CKMBINDEX", "TROPONINI" in the last 72 hours.  BNP: BNP (last 3 results) Recent Labs    12/25/21 1145 01/09/22 1142 10/29/22 1102  BNP 2,219.4* 629.3* 1,605.5*    ProBNP (last 3 results) No results for input(s): "PROBNP" in the last 8760 hours.   D-Dimer No results for input(s): "DDIMER" in the last 72 hours. Hemoglobin A1C Recent Labs    10/30/22 0246  HGBA1C 5.7*   Fasting Lipid Panel No results for input(s): "CHOL", "HDL", "LDLCALC", "TRIG", "CHOLHDL", "LDLDIRECT" in the last 72 hours. Thyroid Function Tests No results for input(s): "TSH", "T4TOTAL", "T3FREE", "THYROIDAB" in the last 72 hours.  Invalid input(s): "FREET3"  Other results:   Imaging    CARDIAC CATHETERIZATION  Result Date: 10/30/2022 1. Mild nonobstructive CAD with myocardial bridging in the distal LAD. 2. Normal filling pressures. 3. Normal PA pressure. 4. Low cardiac output. Nonischemic cardiomyopathy     Medications:     Scheduled Medications:  atorvastatin  40  mg Oral Daily   digoxin  0.125 mg Oral Daily   empagliflozin  10 mg Oral Daily   gabapentin  100 mg Oral BID   influenza vaccine adjuvanted  0.5 mL Intramuscular Tomorrow-1000   insulin aspart  0-9 Units Subcutaneous TID WC   loratadine  10 mg Oral Daily   metoprolol succinate  25 mg Oral Daily   pantoprazole  40 mg Oral BID   pneumococcal 20-valent conjugate vaccine  0.5 mL Intramuscular Tomorrow-1000   rivaroxaban  20 mg Oral Q supper   sacubitril-valsartan  1 tablet Oral BID   sodium chloride flush  3 mL Intravenous Q12H   sodium chloride flush  3 mL Intravenous Q12H   spironolactone  25 mg Oral Daily    Infusions:  sodium chloride     sodium chloride      PRN  Medications: sodium chloride, sodium chloride, acetaminophen, ondansetron (ZOFRAN) IV, perflutren lipid microspheres (DEFINITY) IV suspension, sodium chloride flush, sodium chloride flush    Patient Profile   Jon Williams is a 72 year old with a history of chronic systolic/diastolic heart failure, St Jude ICD, persistent atrial fibrillation, PAF, CAD, DMII, LBBB, HTN, gastric cancer 2022, and prostate cancer 2019. Admitted with A/C HFrEF.   Assessment/Plan  1. Acute on chronic systolic CHF: Long-standing cardiomyopathy, thought to be nonischemic.  St Jude CRT-D device.  Cath in 2018 with nonobstructive CAD but CI at that time was low at 1.9.  Most recent echo was in 8/23 showing EF 20-25%, mild RV dysfunction, moderate-severe Jon.  Lasix was cut back earlier this summer.  He presents with about a week of orthopnea and exertional dyspnea.  Weight up about 5 lbs at home and BNP significantly elevated at 1605.  Worrisomely, his extremities are cool and he has a history of low output HF (RHC in 2018).   - RHC/LHC showed nonobstructive CAD, normal filling pressures, and low cardiac index.  - Volume status looks ok. Start lasix 40 mg daily.  - Continue home Entresto 24/26 bid.  - Continue spironolactone 25 daily - Continue Jardiance 10 daily - Continue low dose Toprol XL.  - ECHO results pending. -Renal function ok.  2. Atrial fibrillation: Looking back at device interrogations, this looks permanent.   - Rate controlled. Continue xarelto.  - Permanent  AF is permanent,off sotalol unless he has had VT.  Will discuss with Dr. Ladona Ridgel.  3. Gastric cancer: Treated, now being monitored.  4. Type 2 diabetes: Restart home meds.    Home today.   Length of Stay: 2  Jon Becket, NP  10/31/2022, 10:21 AM  Advanced Heart Failure Team Pager [phone] (M-F; 7a - 5p)  Please contact [facility] for night-coverage after hours (5p -7a ) and weekends on [email or web address]   Patient seen with NP, agree with the above  note.   He is doing well today, no complaints.    Echo was reviewed: EF 10-15%, severe LV dilation, mild RV dysfunction with moderate RV enlargement, moderate-severe Jon.   General: NAD Neck: No JVD, no thyromegaly or thyroid nodule.  Lungs: Clear to auscultation bilaterally with normal respiratory effort. CV: Lateral PMI.  Heart regular S1/S2, no S3/S4, no murmur.  No peripheral edema.  Abdomen: Soft, nontender, no hepatosplenomegaly, no distention.  Skin: Intact without lesions or rashes.  Neurologic: Alert and oriented x 3.  Psych: Normal affect. Extremities: No clubbing or cyanosis.  HEENT: Normal.   1. Acute on chronic systolic CHF: Long-standing cardiomyopathy, thought to be nonischemic.  St Jude CRT-D device.  Cath in 2018 with nonobstructive CAD but CI at that time was low at 1.9.  Most recent echo was in 8/23 showing EF 20-25%, mild RV dysfunction, moderate-severe Jon.  Lasix was cut back earlier this summer.  He presented with about a week of orthopnea and exertional dyspnea.  Weight up about 5 lbs at home and BNP significantly elevated at 1605.  Worrisomely, his extremities were cool and he has a history of low output HF (RHC in 2018).  Patient diuresed well with IV Lasix, I/Os net negative 4L.  RHC/LHC showed nonobstructive CAD, normal filling pressures, and low cardiac index. Echo today showed EF 10-15%, severe LV dilation, mild RV dysfunction with moderate RV enlargement, moderate-severe Jon. He feels good today, euvolemic.  - Start lasix 40 mg po daily today.  - If device is compatible, will try to get cardiac MRI => cannot get, device not compatible.  - Can continue home Entresto 24/26 bid, no BP room to increase.  - With low output on RHC today, we started digoxin 0.125 daily.  - Continue spironolactone 25 daily - Continue Jardiance 10 daily - Continue low dose Toprol XL.  - He feels great today and filling pressures normal on RHC yesterday.  However, I worry about him  long-term with low output HF (Fick CI 2.16, thermo 1.84).  I will not start him on inotropes as he clinically looks good, but will need very close followup of his clinical course in CHF clinic.  I would like to get CPX soon. We discussed potential need for LVAD, I think he would be a good candidate.  2. Atrial fibrillation: Looking back at device interrogations, this looks permanent.   - Continue Xarelto  - Discussed with Dr. Ladona Ridgel, will stop sotalol with permanent AF.  3. Gastric cancer: Treated, now being monitored.  4. Type 2 diabetes: restart metformin.   OK for home today with very close CHF clinic followup.  Meds for home: Entresto 24/26 bid, spironolactone 25 daily, ToproL Xl 25 daily, Jardiance 10 mg daily, Lasix 40 mg daily, digoxin 0.125 daily, Xarelto 20 daily.    Jon Williams 10/31/2022 12:40 PM

## 2022-10-31 NOTE — Progress Notes (Signed)
  Echocardiogram 2D Echocardiogram has been performed.  Maren Reamer 10/31/2022, 10:05 AM

## 2022-11-01 LAB — LIPOPROTEIN A (LPA): Lipoprotein (a): 184.1 nmol/L — ABNORMAL HIGH (ref <75.0)

## 2022-11-09 NOTE — Progress Notes (Signed)
ADVANCED HF CLINIC CONSULT NOTE   Primary Care: Renford Dills, MD Primary Cardiologist: Kristeen Miss, MD HF Cardiologist: Dr. Shirlee Latch  HPI: Mr Chapple is a 72 y.o. with a history of chronic systolic/diastolic heart failure, St Jude ICD, persistent atrial fibrillation, PAF, CAD, DMII, LBBB, HTN, gastric cancer 2022, and prostate cancer 2019.    Cath 2018 Mild to moderate non obstructive CAD. Fick CI 1.9    Echo 10/22 EF 20-25%, mod-severe MR, mod TR, RV nl   Echo 2/23 EF 20-25%, mild MR, RV not well visualized  Follow up 7/24 with Dr Elease Hashimoto. Lasix was stopped, then restarted a week later at every other day.   Admitted 8/23 with a/c HF. Diuresed with IV lasix and GDMT adjusted. Echo showed EF 20-25%, RV mildly reduced, LA severely dilated, Mod-Severe MR.    Had follow up with Dr Mosetta Putt 09/2022. CT scan no evidence of recurrent cancer. Plan for repeat CT in 8 months.    Admitted 9/24 a/c HF. Echo showed EF 10-15%, G3DD, RV mildly reduced. R/LHC showed nonobstructive CAD, normal filling pressures, and low cardiac index. Unable to get cMRI as device not compatible. GDMT titrated and he was discharged home, weight 183 lbs.  Today he returns for post hospital HF follow up with wife. Overall feeling fine. No SOB walking on flat ground. Balance is off, and has some chest tightness. Denies palpitations, abnormal bleeding, edema, or PND/Orthopnea. Appetite ok. No fever or chills. Weight at home 176 pounds. Taking all medications. No smoking/ETOH/drug. Hands tighen up at night.  Family Hx: mother CVA, MI  ECG (personally reviewed): V paced, AF 64 bpm  Device interrogation (personally reviewed): CorVue stable, RV paced 89%, >99% AF, no VT  ReDs: 32%  Labs (9/24): K 3.7, creatinine 1.13, Lp(a) 184  Cardiac Studies  - R/LHC (9/24): non obstructive CAD; RA 1, PA 28/10 (17), PCWP 8, CO/CI (Fick) 4.15/2.16  - Echo (9/24): EF 10-15%, severe LV dilation, mild RV dysfunction with moderate RV  enlargement, moderate-severe MR.   - Echo (8/24): EF 20-25%, RV mildly reduced, LA severely dilated, moderate to severe MR  - Echo (2/23): EF 20-25%, mild MR, RV not well visualized  - Echo (10/22): EF 20-25%, mod-severe MR, mod TR, RV normal  - R/LHC (2018): Mild to moderate non obstructive CAD; RA 8, PA 42/20 (29), PCWP 17, CO/CI (Fick) 3.76/ 1.9   Review of Systems: [y] = yes, [ ]  = no   General: Weight gain [ ] ; Weight loss [ ] ; Anorexia [ ] ; Fatigue [ ] ; Fever [ ] ; Chills [ ] ; Weakness [ ]   Cardiac: Chest pain/pressure [ ] ; Resting SOB [ ] ; Exertional SOB Cove.Etienne ]; Orthopnea [ ] ; Pedal Edema [ ] ; Palpitations [ ] ; Syncope [ ] ; Presyncope [ ] ; Paroxysmal nocturnal dyspnea[ ]   Pulmonary: Cough [ ] ; Wheezing[ ] ; Hemoptysis[ ] ; Sputum [ ] ; Snoring [ ]   GI: Vomiting[ ] ; Dysphagia[ ] ; Melena[ ] ; Hematochezia [ ] ; Heartburn[ ] ; Abdominal pain [ ] ; Constipation [ ] ; Diarrhea [ ] ; BRBPR [ ]   GU: Hematuria[ ] ; Dysuria [ ] ; Nocturia[ ]   Vascular: Pain in legs with walking [ ] ; Pain in feet with lying flat [ ] ; Non-healing sores [ ] ; Stroke [ ] ; TIA [ ] ; Slurred speech [ ] ;  Neuro: Headaches[ ] ; Vertigo[ ] ; Seizures[ ] ; Paresthesias[ ] ;Blurred vision [ ] ; Diplopia [ ] ; Vision changes [ ]   Ortho/Skin: Arthritis Cove.[place]ienne ]; Joint pain [ ] ; Muscle pain [ ] ; Joint swelling [ ] ; Back Pain [ ] ;  Rash [ ]   Psych: Depression[ ] ; Anxiety[ ]   Heme: Bleeding problems [ ] ; Clotting disorders [ ] ; Anemia [ ]   Endocrine: Diabetes [place]ove.[place] ]; Thyroid dysfunction[ ]   Past Medical History:  Diagnosis Date   AICD (automatic cardioverter/defibrillator) present 05/25/2016   biv icd   Bunion, right    Chronic combined systolic and diastolic CHF (congestive heart failure) (HCC)    Echo 1/18: Mild conc LVH, EF 15-20, severe diff HK, inf and inf-septal AK, Gr 3 DD, mild to mod MR, severe LAE, mod reduced RVSF, mod RAE, mild TR, PASP 50   Coronary artery disease involving native coronary artery without angina pectoris 04/17/2016    LHC 1/18: pLCx 30, mLCx 20, mRCA 40, dRCA 20, LVEDP 23, mean RA 8, PA 42/20, PCWP 17   Diabetes mellitus without complication (HCC)    Gastric cancer (HCC)    History of atrial fibrillation    History of atrial flutter    History of cardiomegaly 06/07/2016   Noted on CXR   History of colon polyps 06/28/2017   Noted on colonoscopy   Hypertension    LBBB (left bundle branch block)    Nausea vomiting and diarrhea 04/04/2021   NICM (nonischemic cardiomyopathy) (HCC)    Echo 1/18:  Mild conc LVH, EF 15-20, severe diff HK, inf and inf-septal AK, Gr 3 DD, mild to mod MR, severe LAE, mod reduced RVSF, mod RAE, mild TR, PASP 50   Other secondary pulmonary hypertension (HCC) 04/17/2016   Prostate cancer (HCC) 2019   Sigmoid diverticulosis 06/28/2017   Noted on colonoscopy   Current Outpatient Medications  Medication Sig Dispense Refill   atorvastatin (LIPITOR) 40 MG tablet Take 1 tablet (40 mg total) by mouth daily. 90 tablet 1   Biotin 1000 MCG tablet Take 1,000 mcg by mouth daily with breakfast.     cholecalciferol (VITAMIN D3) 25 MCG (1000 UT) tablet Take 1,000 Units by mouth daily with breakfast.     digoxin (LANOXIN) 0.125 MG tablet Take 1 tablet (0.125 mg total) by mouth daily. 30 tablet 6   empagliflozin (JARDIANCE) 10 MG TABS tablet Take 1 tablet (10 mg total) by mouth daily before breakfast. Pt requesting Jardiance instead of Farxiga 30 tablet 11   furosemide (LASIX) 40 MG tablet Take 1 tablet (40 mg total) by mouth daily. 30 tablet 6   gabapentin (NEURONTIN) 100 MG capsule Take 1 capsule (100 mg total) by mouth 2 (two) times daily. 180 capsule 1   ketoconazole (NIZORAL) 2 % cream Apply 1 Application topically 2 (two) times daily. 30 g 3   loratadine (CLARITIN) 10 MG tablet Take 10 mg by mouth daily.     metFORMIN (GLUCOPHAGE-XR) 500 MG 24 hr tablet Take 500 mg by mouth every evening.     metoprolol succinate (TOPROL-XL) 25 MG 24 hr tablet Take 1 tablet (25 mg total) by mouth daily. 90  tablet 1   multivitamin (ONE-A-DAY MEN'S) TABS tablet Take 1 tablet by mouth daily with breakfast.     Naftifine HCl (NAFTIN) 2 % GEL Apply 1 application  topically every Monday, Wednesday, and Friday.     pantoprazole (PROTONIX) 40 MG tablet Take 1 tablet (40 mg total) by mouth 2 (two) times daily. 60 tablet 2   potassium chloride (KLOR-CON) 10 MEQ tablet Take 2 tablets (20 mEq total) by mouth daily. 90 tablet 6   sacubitril-valsartan (ENTRESTO) 24-26 MG Take 1 tablet by mouth 2 (two) times daily. 180 tablet 3   spironolactone (ALDACTONE) 25  MG tablet Take 1 tablet (25 mg total) by mouth daily. 90 tablet 1   Tavaborole 5 % SOLN Apply 1 application  topically daily. Nail on left hand     XARELTO 20 MG TABS tablet TAKE 1 TABLET BY MOUTH EVERY DAY WITH DINNER (Patient taking differently: Take 20 mg by mouth daily.) 90 tablet 1   No current facility-administered medications for this encounter.   Allergies  Allergen Reactions   Aspirin Anaphylaxis and Hives   Sulfa Antibiotics Anaphylaxis, Hives, Swelling and Other (See Comments)    Swollen lips   Social History   Socioeconomic History   Marital status: Married    Spouse name: Not on file   Number of children: 2   Years of education: Not on file   Highest education level: Not on file  Occupational History   Occupation: retired  Tobacco Use   Smoking status: Never   Smokeless tobacco: Never  Vaping Use   Vaping status: Never Used  Substance and Sexual Activity   Alcohol use: No   Drug use: No   Sexual activity: Not Currently  Other Topics Concern   Not on file  Social History Narrative   Retired Location manager. Married to [name]. Daughter, [name], lives in [place]. Son, [place], lives in [place].   Social Determinants of Health   Financial Resource Strain: Low Risk  (12/26/2021)   Overall Financial Resource Strain (CARDIA)    Difficulty of Paying Living Expenses: Not very hard  Food Insecurity: No Food  Insecurity (10/29/2022)   Hunger Vital Sign    Worried About Running Out of Food in the Last Year: Never true    Ran Out of Food in the Last Year: Never true  Transportation Needs: No Transportation Needs (10/29/2022)   PRAPARE - Administrator, Civil Service (Medical): No    Lack of Transportation (Non-Medical): No  Physical Activity: Insufficiently Active (09/26/2021)   Exercise Vital Sign    Days of Exercise per Week: 7 days    Minutes of Exercise per Session: 10 min  Stress: No Stress Concern Present (09/26/2021)   [employer] of Occupational Health - Occupational Stress Questionnaire    Feeling of Stress : Not at all  Social Connections: Not on file  Intimate Partner Violence: Not At Risk (10/29/2022)   Humiliation, Afraid, Rape, and Kick questionnaire    Fear of Current or Ex-Partner: No    Emotionally Abused: No    Physically Abused: No    Sexually Abused: No   Family History  Problem Relation Age of Onset   Hypertension Mother    Heart disease Mother    Diabetes Mother    Diabetes Father    Hypertension Father    Cancer Father        lung cancer   Healthy Sister    Heart attack Brother    Heart disease Brother 103       + tobacco   Healthy Brother    BP 108/76   Pulse (!) 58   Wt 81 kg (178 lb 9.6 oz)   SpO2 94%   BMI 28.83 kg/m   Wt Readings from Last 3 Encounters:  11/12/22 81 kg (178 lb 9.6 oz)  10/31/22 83 kg (182 lb 15.7 oz)  10/11/22 83.7 kg (184 lb 9.6 oz)   PHYSICAL EXAM: General:  NAD. No resp difficulty, walked into clinic HEENT: Normal Neck: Supple. No JVD. Carotids 2+ bilat; no bruits. No lymphadenopathy or thryomegaly  appreciated. Cor: PMI nondisplaced. Regular rate & rhythm. No rubs, gallops or murmurs. Lungs: Clear Abdomen: Soft, nontender, nondistended. No hepatosplenomegaly. No bruits or masses. Good bowel sounds. Extremities: No cyanosis, clubbing, rash, edema Neuro: Alert & oriented x 3, cranial nerves grossly intact.  Moves all 4 extremities w/o difficulty. Affect pleasant.  ASSESSMENT & PLAN: 1. Chronic systolic CHF: Long-standing cardiomyopathy, thought to be nonischemic.  St Jude CRT-D device.  Cath in 2018 with nonobstructive CAD but CI at that time was low at 1.9.  Most recent echo was in 8/23 showing EF 20-25%, mild RV dysfunction, moderate-severe MR.  Lasix was cut back earlier this summer. Admitted 9/24 with a/c HF. Echo showed EF 10-15%, G3DD, RV mildly reduced. R/LHC showed non obstructive CAD, normal filling pressures and low CO. Unable to get cardiac MRI as his device is not compatible. Today, NYHA IIb, he is not volume overloaded by exam or CorVue. ReDs 32% - Continue digoxin. Check dig level today. - Continue Lasix 40 mg daily. BMET and BNP today. - Continue Entresto 24/26 mg bid.  - Continue spironolactone 25 mg daily - Continue Jardiance 10 daily - Continue Toprol XL 25 mg daily.  - Arrange CPX. He could potentially be an LVAD candidate down the road. We briefly discussed this today. - Refer to Cardiac Rehab. 2. Atrial fibrillation: Looking back at device interrogations, this looks permanent.   - Rate controlled. Continue xarelto. No bleeding issues today. CBC today. - Permanent  AF is permanent, off sotalol unless he has had VT.  3. Gastric cancer: Treated, now being monitored.  4. Type 2 diabetes: Most recent A1C 5.7  Follow up in 3 weeks with pharmacy for GDMT titration and 3 months with Dr. Shirlee Latch + echo.  Prince Rome, FNP-BC 11/12/22

## 2022-11-12 ENCOUNTER — Encounter (HOSPITAL_COMMUNITY): Payer: Self-pay

## 2022-11-12 ENCOUNTER — Other Ambulatory Visit (HOSPITAL_COMMUNITY): Payer: Self-pay

## 2022-11-12 ENCOUNTER — Ambulatory Visit (HOSPITAL_COMMUNITY)
Admit: 2022-11-12 | Discharge: 2022-11-12 | Disposition: A | Discharge status: Home / Self Care | Payer: Medicare HMO | Attending: Family Medicine | Admitting: Family Medicine

## 2022-11-12 ENCOUNTER — Encounter (HOSPITAL_COMMUNITY): Payer: Medicare HMO | Admitting: Cardiology

## 2022-11-12 ENCOUNTER — Other Ambulatory Visit: Payer: Self-pay

## 2022-11-12 VITALS — BP 108/76 | HR 58 | Wt 178.6 lb

## 2022-11-12 DIAGNOSIS — I2510 Atherosclerotic heart disease of native coronary artery without angina pectoris: Secondary | ICD-10-CM | POA: Diagnosis not present

## 2022-11-12 DIAGNOSIS — Z79899 Other long term (current) drug therapy: Secondary | ICD-10-CM | POA: Insufficient documentation

## 2022-11-12 DIAGNOSIS — Z95810 Presence of automatic (implantable) cardiac defibrillator: Secondary | ICD-10-CM | POA: Diagnosis not present

## 2022-11-12 DIAGNOSIS — I110 Hypertensive heart disease with heart failure: Secondary | ICD-10-CM | POA: Insufficient documentation

## 2022-11-12 DIAGNOSIS — Z7901 Long term (current) use of anticoagulants: Secondary | ICD-10-CM | POA: Insufficient documentation

## 2022-11-12 DIAGNOSIS — I4819 Other persistent atrial fibrillation: Secondary | ICD-10-CM | POA: Diagnosis not present

## 2022-11-12 DIAGNOSIS — Z85028 Personal history of other malignant neoplasm of stomach: Secondary | ICD-10-CM | POA: Diagnosis not present

## 2022-11-12 DIAGNOSIS — C169 Malignant neoplasm of stomach, unspecified: Secondary | ICD-10-CM | POA: Insufficient documentation

## 2022-11-12 DIAGNOSIS — E119 Type 2 diabetes mellitus without complications: Secondary | ICD-10-CM | POA: Diagnosis not present

## 2022-11-12 DIAGNOSIS — I429 Cardiomyopathy, unspecified: Secondary | ICD-10-CM | POA: Insufficient documentation

## 2022-11-12 DIAGNOSIS — I5022 Chronic systolic (congestive) heart failure: Secondary | ICD-10-CM

## 2022-11-12 DIAGNOSIS — Z4502 Encounter for adjustment and management of automatic implantable cardiac defibrillator: Secondary | ICD-10-CM | POA: Insufficient documentation

## 2022-11-12 DIAGNOSIS — Z8546 Personal history of malignant neoplasm of prostate: Secondary | ICD-10-CM | POA: Insufficient documentation

## 2022-11-12 DIAGNOSIS — Z7984 Long term (current) use of oral hypoglycemic drugs: Secondary | ICD-10-CM | POA: Diagnosis not present

## 2022-11-12 DIAGNOSIS — I4821 Permanent atrial fibrillation: Secondary | ICD-10-CM | POA: Diagnosis not present

## 2022-11-12 LAB — CBC
HCT: 45.7 % (ref 39.0–52.0)
Hemoglobin: 15.2 g/dL (ref 13.0–17.0)
MCH: 32.5 pg (ref 26.0–34.0)
MCHC: 33.3 g/dL (ref 30.0–36.0)
MCV: 97.6 fL (ref 80.0–100.0)
Platelets: 213 10*3/uL (ref 150–400)
RBC: 4.68 MIL/uL (ref 4.22–5.81)
RDW: 12.9 % (ref 11.5–15.5)
WBC: 6.4 10*3/uL (ref 4.0–10.5)
nRBC: 0 % (ref 0.0–0.2)

## 2022-11-12 LAB — BRAIN NATRIURETIC PEPTIDE: B Natriuretic Peptide: 672.4 pg/mL — ABNORMAL HIGH (ref 0.0–100.0)

## 2022-11-12 LAB — BASIC METABOLIC PANEL
Anion gap: 15 (ref 5–15)
BUN: 22 mg/dL (ref 8–23)
CO2: 23 mmol/L (ref 22–32)
Calcium: 9.8 mg/dL (ref 8.9–10.3)
Chloride: 97 mmol/L — ABNORMAL LOW (ref 98–111)
Creatinine, Ser: 1.48 mg/dL — ABNORMAL HIGH (ref 0.61–1.24)
GFR, Estimated: 49 mL/min — ABNORMAL LOW (ref >60)
Glucose, Bld: 100 mg/dL — ABNORMAL HIGH (ref 70–99)
Potassium: 4.3 mmol/L (ref 3.5–5.1)
Sodium: 135 mmol/L (ref 135–145)

## 2022-11-12 LAB — DIGOXIN LEVEL: Digoxin Level: 1.2 ng/mL (ref 0.8–2.0)

## 2022-11-12 MED ORDER — FUROSEMIDE 40 MG PO TABS
40.0000 mg | ORAL_TABLET | Freq: Every day | ORAL | 6 refills | Status: DC
  Filled 2022-11-12 – 2022-11-13 (×2): qty 30, 30d supply, fill #0
  Filled 2023-03-07: qty 30, 30d supply, fill #1
  Filled 2023-03-20 – 2023-04-02 (×3): qty 30, 30d supply, fill #2
  Filled 2023-04-23 – 2023-04-25 (×3): qty 30, 30d supply, fill #3
  Filled 2023-05-31: qty 30, 30d supply, fill #4
  Filled 2023-06-28: qty 30, 30d supply, fill #5
  Filled 2023-07-29: qty 30, 30d supply, fill #6

## 2022-11-12 MED ORDER — SPIRONOLACTONE 25 MG PO TABS
25.0000 mg | ORAL_TABLET | Freq: Every day | ORAL | 1 refills | Status: DC
  Filled 2022-11-12 – 2022-11-29 (×3): qty 90, 90d supply, fill #0
  Filled 2023-03-07: qty 90, 90d supply, fill #1

## 2022-11-12 MED ORDER — ENTRESTO 24-26 MG PO TABS
1.0000 | ORAL_TABLET | Freq: Two times a day (BID) | ORAL | 1 refills | Status: DC
  Filled 2022-11-12 – 2022-12-12 (×2): qty 180, 90d supply, fill #0
  Filled 2023-05-20 (×2): qty 180, 90d supply, fill #1

## 2022-11-12 MED ORDER — METOPROLOL SUCCINATE ER 25 MG PO TB24
25.0000 mg | ORAL_TABLET | Freq: Every day | ORAL | 1 refills | Status: DC
  Filled 2022-11-12: qty 90, 90d supply, fill #0
  Filled 2022-12-12: qty 90, 90d supply, fill #1

## 2022-11-12 MED ORDER — DIGOXIN 125 MCG PO TABS
0.1250 mg | ORAL_TABLET | Freq: Every day | ORAL | 6 refills | Status: DC
  Filled 2022-11-12 – 2022-11-29 (×3): qty 30, 30d supply, fill #0

## 2022-11-12 MED ORDER — EMPAGLIFLOZIN 10 MG PO TABS
10.0000 mg | ORAL_TABLET | Freq: Every day | ORAL | 11 refills | Status: DC
  Filled 2022-11-12 – 2022-11-29 (×2): qty 30, 30d supply, fill #0

## 2022-11-12 MED ORDER — POTASSIUM CHLORIDE ER 10 MEQ PO TBCR
20.0000 meq | EXTENDED_RELEASE_TABLET | Freq: Every day | ORAL | 3 refills | Status: DC
  Filled 2022-11-12: qty 60, 30d supply, fill #0
  Filled 2023-03-07: qty 60, 30d supply, fill #1
  Filled 2023-05-20 (×2): qty 60, 30d supply, fill #2
  Filled 2023-06-28: qty 60, 30d supply, fill #3

## 2022-11-12 MED ORDER — ATORVASTATIN CALCIUM 40 MG PO TABS
40.0000 mg | ORAL_TABLET | Freq: Every day | ORAL | 1 refills | Status: DC
  Filled 2022-11-12: qty 90, 90d supply, fill #0
  Filled 2023-02-26: qty 90, 90d supply, fill #1

## 2022-11-12 NOTE — Patient Instructions (Signed)
Medication Changes:  No Changes In Medications at this time.   Lab Work:  Labs done today, your results will be available in MyChart, we will contact you for abnormal readings.  Testing/Procedures:  Expect to be in the lab for 2 hours. Please plan to arrive 30 minutes prior to your appointment. You may be asked to reschedule your test if you arrive 20 minutes or more after your scheduled appointment time.  Main Campus address: [address] [place], [place] [address] You may arrive to the Main Entrance A or Entrance C (free valet parking is available at both). -Main Entrance A (on [address]) :proceed to admitting for check in -Entrance C (on [facility]): proceed to [employer] parking or under hospital deck parking using this code _________  Check In: [facility] waiting room (1st floor)   General Instructions for the day of the test (Please follow all instructions from your physician): Refrain from ingesting a heavy meal, alcohol, or caffeine or using tobacco products within 2 hours of the test (DO NOT FAST for mare than 8 hours). You may have all other non-alcoholic, non -caffeinated beverage,a light snack (crackers,a piece of fruit, carrot sticks, toast bagel,etc) up to your appointment. Avoid significant exertion or exercise within 24 hours of your test. Be prepared to exercise and sweat. Your clothing should permit freedom of movement and include walking or running shoes. Women bring loose fitting short sleeved blouse.  This evaluation may be fatiguing and you may wish ti have someone accompany you to the assessment to drive you home afterward. Bring a list of your medications with you, including dosage and frequency you take the medications (  I.e.,once per day, twice per day, etc). Take all medications as prescribed, unless noted below or instructed to do so by your physician.  Please do not take the following medications prior to your  CPX:  _________________________________________________  _________________________________________________  Brief description of the test: A brief lung test will be performed. This will involve you taking deep breaths and blowing hard and fast through your mouth. During these , a clip will be on your nose and you will be breathing through a breathing device.   For the exercise portion of the test you will be walking on a treadmill, or riding a stationary bike, to your maximal effor or until symptoms such as chest pain, shortness of breath, leg pain or dizziness limit your exercise. You will be breathing in and out of a breathing device through your mouth (a clip will be on your nose again). Your heart rate, ECG, blood pressure, oxygen saturations, breathing rate and depth, amount of oxygen you consume and amount of carbon dioxide you produce will be measured and monitored throughout the exercise test.  If you need to cancel or reschedule your appointment please call [phone] If you have further questions please call your physician or Philip Aspen, MS, ACSM-RCEP at [phone]  Referrals:  YOU HAVE BEEN REFERRED TO CARDIAC REHAB THEY WILL REACH OUT TO YOU OR CALL TO ARRANGE THIS. PLEASE CALL us WITH ANY CONCERNS   Follow-Up in: 3 WEEKS AS SCHEDULED WITH PHARMD   THEN 3 MONTHS WITH ECHO AND WITH DR. Shirlee Latch   At the [facility], you and your health needs are our priority. We have a designated team specialized in the treatment of Heart Failure. This Care Team includes your primary Heart Failure Specialized Cardiologist (physician), Advanced Practice Providers (APPs- Physician Assistants and Nurse Practitioners), and Pharmacist  who all work together to provide you with the care you need, when you need it.   You may see any of the following providers on your designated Care Team at your next follow up:  Dr. Arvilla Meres Dr. Marca Ancona Dr. Marcos Eke, NP Robbie Lis, [place] [facility] [place], [place] Brynda Peon, NP Karle Plumber, PharmD   Please be sure to bring in all your medications bottles to every appointment.   Need to Contact us:  If you have any questions or concerns before your next appointment please send [place] a message through [place] or call our office at [phone].    TO LEAVE A MESSAGE FOR THE NURSE SELECT OPTION 2, PLEASE LEAVE A MESSAGE INCLUDING: YOUR NAME DATE OF BIRTH CALL BACK NUMBER REASON FOR CALL**this is important as we prioritize the call backs  YOU WILL RECEIVE A CALL BACK THE SAME DAY AS LONG AS YOU CALL BEFORE 4:00 PM

## 2022-11-12 NOTE — Progress Notes (Signed)
ReDS Vest / Clip - 11/12/22 1100       ReDS Vest / Clip   Station Marker D    Ruler Value 29    ReDS Value Range Low volume    ReDS Actual Value 32

## 2022-11-13 ENCOUNTER — Other Ambulatory Visit (HOSPITAL_COMMUNITY): Payer: Self-pay

## 2022-11-13 MED ORDER — COVID-19 MRNA VAC-TRIS(PFIZER) 30 MCG/0.3ML IM SUSY
0.3000 mL | PREFILLED_SYRINGE | Freq: Once | INTRAMUSCULAR | 0 refills | Status: AC
  Filled 2022-11-13: qty 0.3, 1d supply, fill #0

## 2022-11-13 MED ORDER — INFLUENZA VAC A&B SURF ANT ADJ 0.5 ML IM SUSY
0.5000 mL | PREFILLED_SYRINGE | Freq: Once | INTRAMUSCULAR | 0 refills | Status: AC
  Filled 2022-11-13: qty 0.5, 1d supply, fill #0

## 2022-11-16 ENCOUNTER — Other Ambulatory Visit: Payer: Self-pay

## 2022-11-16 ENCOUNTER — Other Ambulatory Visit (HOSPITAL_COMMUNITY): Payer: Self-pay

## 2022-11-16 DIAGNOSIS — I700 Atherosclerosis of aorta: Secondary | ICD-10-CM | POA: Diagnosis not present

## 2022-11-16 DIAGNOSIS — Z95810 Presence of automatic (implantable) cardiac defibrillator: Secondary | ICD-10-CM | POA: Diagnosis not present

## 2022-11-16 DIAGNOSIS — I4891 Unspecified atrial fibrillation: Secondary | ICD-10-CM | POA: Diagnosis not present

## 2022-11-16 DIAGNOSIS — D6869 Other thrombophilia: Secondary | ICD-10-CM | POA: Diagnosis not present

## 2022-11-16 DIAGNOSIS — E119 Type 2 diabetes mellitus without complications: Secondary | ICD-10-CM | POA: Diagnosis not present

## 2022-11-16 DIAGNOSIS — E7800 Pure hypercholesterolemia, unspecified: Secondary | ICD-10-CM | POA: Diagnosis not present

## 2022-11-16 DIAGNOSIS — I428 Other cardiomyopathies: Secondary | ICD-10-CM | POA: Diagnosis not present

## 2022-11-16 DIAGNOSIS — C61 Malignant neoplasm of prostate: Secondary | ICD-10-CM | POA: Diagnosis not present

## 2022-11-16 DIAGNOSIS — C169 Malignant neoplasm of stomach, unspecified: Secondary | ICD-10-CM | POA: Diagnosis not present

## 2022-11-16 MED ORDER — JARDIANCE 10 MG PO TABS
10.0000 mg | ORAL_TABLET | Freq: Every day | ORAL | 3 refills | Status: AC
Start: 2022-11-16 — End: ?
  Filled 2022-11-16 (×2): qty 30, 30d supply, fill #0
  Filled 2022-12-12: qty 90, 90d supply, fill #1
  Filled 2023-03-12: qty 90, 90d supply, fill #2
  Filled 2023-06-09: qty 90, 90d supply, fill #3
  Filled 2023-09-06 (×2): qty 60, 60d supply, fill #4

## 2022-11-20 ENCOUNTER — Ambulatory Visit: Payer: Medicare HMO | Admitting: Nurse Practitioner

## 2022-11-20 ENCOUNTER — Other Ambulatory Visit (HOSPITAL_COMMUNITY): Payer: Self-pay

## 2022-11-21 ENCOUNTER — Ambulatory Visit (HOSPITAL_COMMUNITY): Payer: Medicare HMO | Attending: Cardiology

## 2022-11-21 ENCOUNTER — Encounter (HOSPITAL_COMMUNITY): Payer: Self-pay | Admitting: *Deleted

## 2022-11-21 DIAGNOSIS — I5022 Chronic systolic (congestive) heart failure: Secondary | ICD-10-CM

## 2022-11-21 NOTE — Progress Notes (Unsigned)
Was called to CPX room as patient had become weak and was helped into the chair. BP was in the low 90's patient stated his chest became tight around his rib cage,felt dizzy and lightheaded.  As we were talking he stated the dizzy/ lightheadedness was going away. Dr. Elwyn Lade was called into the room to assess the patient.  He was happy with patient's recovery.

## 2022-11-21 NOTE — Progress Notes (Signed)
Patient arrived to CPX with BP 94/70, HR 89, O2 sats 99%. Patient reported weakness and intermittent ribcage tightness. Proceeded to attempted CPX after risk stratification. Patient with significant weakness and unable to self ambulate to position for exercise. Patient was over exerted just trying to get on the cycle ergometer, requiring much assistance returning to chair. Patient reported being incredibly drained and dizzy.    Nurse and clinic physician both notified and assessed with medications changes, verbally ordered directly to the patient's wife.   CPX cancelled with no plans to reschedule at the current time.     Reggy Eye, MS, ACSM, [facility] Clinical Exercise Physiologist/ Health and Wellness Coach

## 2022-11-26 ENCOUNTER — Encounter: Payer: Self-pay | Admitting: Cardiology

## 2022-11-26 ENCOUNTER — Ambulatory Visit (HOSPITAL_COMMUNITY)
Admission: RE | Admit: 2022-11-26 | Discharge: 2022-11-26 | Disposition: A | Discharge status: Home / Self Care | Payer: Medicare HMO | Source: Ambulatory Visit | Attending: Cardiology | Admitting: Cardiology

## 2022-11-26 DIAGNOSIS — I5022 Chronic systolic (congestive) heart failure: Secondary | ICD-10-CM | POA: Insufficient documentation

## 2022-11-26 LAB — DIGOXIN LEVEL: Digoxin Level: 0.2 ng/mL — ABNORMAL LOW (ref 0.8–2.0)

## 2022-11-28 ENCOUNTER — Other Ambulatory Visit (HOSPITAL_COMMUNITY): Payer: Self-pay

## 2022-11-29 ENCOUNTER — Telehealth (HOSPITAL_COMMUNITY): Payer: Self-pay

## 2022-11-29 ENCOUNTER — Other Ambulatory Visit (HOSPITAL_COMMUNITY): Payer: Self-pay

## 2022-11-29 ENCOUNTER — Other Ambulatory Visit: Payer: Self-pay

## 2022-11-29 MED ORDER — DIGOXIN 125 MCG PO TABS
0.0625 mg | ORAL_TABLET | Freq: Every day | ORAL | 3 refills | Status: DC
Start: 2022-11-29 — End: 2023-03-15
  Filled 2022-11-29: qty 30, 30d supply, fill #0
  Filled 2022-11-30: qty 30, 15d supply, fill #0
  Filled 2022-11-30: qty 15, 30d supply, fill #0
  Filled 2022-12-12 – 2022-12-25 (×2): qty 15, 30d supply, fill #1
  Filled 2023-01-24: qty 15, 30d supply, fill #2
  Filled 2023-02-26: qty 15, 30d supply, fill #3

## 2022-11-29 NOTE — Telephone Encounter (Signed)
Received call from patient's wife who states that patient has been off of his digoxin since before blood work repeat- she would like to know if patient needs to resume normal dose of digoxin at 0.125mg  or 0.0625 as previously discussed since last digoxin level. Will forward to provider for clarification.

## 2022-11-29 NOTE — Telephone Encounter (Signed)
Robbie Lis M, PA-C  You36 minutes ago (2:13 PM)    Digoxin 0.0625   Returned call to patients wife and made her aware of the above. New dosage sent to patient preferred pharmacy, advised her to call back with any issues, questions, or concerns. Patients wife verbalized understanding.

## 2022-11-30 ENCOUNTER — Other Ambulatory Visit (HOSPITAL_COMMUNITY): Payer: Self-pay

## 2022-11-30 ENCOUNTER — Encounter: Payer: Self-pay | Admitting: Hematology

## 2022-12-01 NOTE — Progress Notes (Incomplete)
***In Progress***    Advanced Heart Failure Clinic Note  Primary Care: Renford Dills, MD Primary Cardiologist: Kristeen Miss, MD HF Cardiologist: Dr. Shirlee Latch  HPI:  Mr Cayabyab is a 72 y.o. with a history of chronic systolic/diastolic heart failure, St Jude ICD, persistent atrial fibrillation, PAF, CAD, DMII, LBBB, HTN, prostate cancer 2019, and gastric cancer 2022. His family history includes CVA and MI in his mother.   Cath 2018 Mild to moderate non obstructive CAD. Fick CI 1.9    Echo 11/2020 EF 20-25%, mod-severe MR, mod TR, RV normal    Echo 03/2021 EF 20-25%, mild MR, RV not well visualized   Follow up 08/2022 with Dr Elease Hashimoto. Lasix was stopped, then restarted a week later at every other day.   Admitted 09/2022 with a/c (Acute congestive?) HF. Diuresed with IV lasix and GDMT adjusted. Echo showed EF 20-25%, RV mildly reduced, LA severely dilated, Mod-Severe MR.    Had follow up with Dr Mosetta Putt 09/2022. CT scan no evidence of recurrent cancer. Planned for repeat CT in 8 months.    Admitted 10/2022 a/c HF. Echo showed EF 10-15%, G3DD, RV mildly reduced. R/LHC showed nonobstructive CAD, normal filling pressures, and low cardiac index. Unable to get cMRI as device not compatible. GDMT titrated and he was discharged home, weight 183 lbs.   Recently seen by APP on 11/12/22. He returned for post hospital HF follow up with wife. Overall felt fine. No SOB walking on flat ground. Balance was off, and had some chest tightness. Denied palpitations, abnormal bleeding, edema, or PND/Orthopnea. Appetite ok. No fever or chills. Weight at home 176 pounds. Took all medications. No smoking/ETOH/drug. Hands tightened up at night.  Today he returns to HF clinic for pharmacist medication titration. At last visit with APP, digoxin 0.0625 mg was restarted.  - CPX (cardiopulmonary exercise test) was arranged but was cancelled after he felt weakness and dizziness when he tried to position for exercise on 10/9.    Overall feeling ***. Dizziness, lightheadedness, fatigue:  Chest pain or palpitations:  How is your breathing?: *** SOB: Able to complete all ADLs. Activity level ***  Weight at home pounds. Takes furosemide/torsemide/bumex *** mg *** daily.  LEE PND/Orthopnea  Appetite *** Low-salt diet:   Physical Exam Cost/affordability of meds   HF Medications: Metoprolol succinate 25 mg daily Entresto 24/26 mg BID - where is he getting this filled at? No med hx since feb 2024 but could be getting it from mail order - saw that last year nov he wasn't regularly taking d/t low home BP - saw he requested to have it removed from his med list on 11/13/22 Spironolactone 25 mg daily Jardiance 10 mg daily Lasix 40 mg daily Digoxin 0.0625 mg daily  Has the patient been experiencing any side effects to the medications prescribed?  {YES NO:22349}  Does the patient have any problems obtaining medications due to transportation or finances?   {YES NO:22349}  Understanding of regimen: {excellent/good/fair/poor:19665} Understanding of indications: {excellent/good/fair/poor:19665} Potential of compliance: {excellent/good/fair/poor:19665} Patient understands to avoid NSAIDs. Patient understands to avoid decongestants.    Pertinent Lab Values: 11/12/22 Serum creatinine 1.48, BUN 22, Potassium 4.3, Sodium 135, BNP 672.4, Magnesium 2 (10/30/22), Digoxin 0.2 (11/26/22)   Vital Signs: Weight: *** (last clinic weight: 178 lbs) Blood pressure: ***  Heart rate: ***   Assessment/Plan: 1. Chronic systolic CHF: Long-standing cardiomyopathy, thought to be nonischemic. St Jude CRT-D device.  Cath in 2018 with nonobstructive CAD but CI at that time was low at  1.9. Most recent echo was in 09/2021 showing EF 20-25%, mild RV dysfunction, moderate-severe MR. Lasix was cut back earlier this summer. Admitted 10/2022 with a/c HF. Echo showed EF 10-15%, G3DD, RV mildly reduced. R/LHC showed non obstructive CAD, normal  filling pressures and low CO. Unable to get cardiac MRI as his device is not compatible. Today, NYHA IIb, he is not volume overloaded by exam or CorVue. ReDs 32% - Continue Lasix 40 mg daily. BMET and BNP today. - Continue metoprolol succinate 25 mg daily.  - Continue Entresto 24/26 mg BID.  - Continue spironolactone 25 mg daily - Continue Jardiance 10 daily - Continue digoxin 0.0625 mg daily. Check dig level today. - CPX was scheduled but was called off after he experienced weakness and dizziness while attempting to get into position for the exercise. He could potentially be an LVAD candidate down the road; previously discussed with Dr. Shirlee Latch. - Referred to Cardiac Rehab. 2. Atrial fibrillation: Looking back at device interrogations, this looks permanent.   - Rate controlled. Continue xarelto. No bleeding issues today. CBC today. - Permanent AF, off sotalol unless he has had VT.  3. Gastric cancer: Treated, now being monitored.  4. Type 2 diabetes: A1C 5.7 (10/30/22)    Follow up with Dr. Shirlee Latch on 02/01/23 + echo.   Karle Plumber, PharmD, BCPS, BCCP, CPP Heart Failure Clinic Pharmacist [phone]

## 2022-12-04 ENCOUNTER — Telehealth (HOSPITAL_COMMUNITY): Payer: Self-pay

## 2022-12-04 ENCOUNTER — Encounter: Payer: Self-pay | Admitting: Hematology

## 2022-12-04 ENCOUNTER — Other Ambulatory Visit (HOSPITAL_COMMUNITY): Payer: Self-pay

## 2022-12-04 ENCOUNTER — Ambulatory Visit (HOSPITAL_COMMUNITY)
Admission: RE | Admit: 2022-12-04 | Discharge: 2022-12-04 | Disposition: A | Discharge status: Home / Self Care | Payer: Medicare HMO | Source: Ambulatory Visit | Attending: Cardiology | Admitting: Cardiology

## 2022-12-04 VITALS — BP 98/70 | HR 68 | Wt 179.4 lb

## 2022-12-04 DIAGNOSIS — I5042 Chronic combined systolic (congestive) and diastolic (congestive) heart failure: Secondary | ICD-10-CM | POA: Diagnosis present

## 2022-12-04 DIAGNOSIS — I5022 Chronic systolic (congestive) heart failure: Secondary | ICD-10-CM | POA: Insufficient documentation

## 2022-12-04 DIAGNOSIS — I4821 Permanent atrial fibrillation: Secondary | ICD-10-CM | POA: Insufficient documentation

## 2022-12-04 DIAGNOSIS — Z95810 Presence of automatic (implantable) cardiac defibrillator: Secondary | ICD-10-CM | POA: Insufficient documentation

## 2022-12-04 DIAGNOSIS — Z7984 Long term (current) use of oral hypoglycemic drugs: Secondary | ICD-10-CM | POA: Insufficient documentation

## 2022-12-04 DIAGNOSIS — I428 Other cardiomyopathies: Secondary | ICD-10-CM | POA: Insufficient documentation

## 2022-12-04 DIAGNOSIS — E119 Type 2 diabetes mellitus without complications: Secondary | ICD-10-CM | POA: Diagnosis not present

## 2022-12-04 DIAGNOSIS — I2510 Atherosclerotic heart disease of native coronary artery without angina pectoris: Secondary | ICD-10-CM | POA: Insufficient documentation

## 2022-12-04 DIAGNOSIS — C169 Malignant neoplasm of stomach, unspecified: Secondary | ICD-10-CM | POA: Insufficient documentation

## 2022-12-04 NOTE — Patient Instructions (Signed)
It was a pleasure seeing you today! ? ?MEDICATIONS: ?-No medication changes today ?-Call if you have questions about your medications. ? ? ?NEXT APPOINTMENT: ?Return to clinic in 2 months with Dr. McLean. ? ?In general, to take care of your heart failure: ?-Limit your fluid intake to 2 Liters (half-gallon) per day.   ?-Limit your salt intake to ideally 2-3 grams (2000-3000 mg) per day. ?-Weigh yourself daily and record, and bring that "weight diary" to your next appointment.  (Weight gain of 2-3 pounds in 1 day typically means fluid weight.) ?-The medications for your heart are to help your heart and help you live longer.   ?-Please contact us before stopping any of your heart medications. ? ?Call the clinic at [phone] with questions or to reschedule future appointments.  ?

## 2022-12-04 NOTE — Progress Notes (Signed)
Advanced Heart Failure Clinic Note   Primary Care: Renford Dills, MD Primary Cardiologist: Kristeen Miss, MD HF Cardiologist: Dr. Shirlee Latch  HPI:  Jon Williams is a 72 y.o. with a history of chronic systolic/diastolic heart failure, St Jude ICD, persistent atrial fibrillation, PAF, CAD, DMII, LBBB, HTN, prostate cancer 2019, and gastric cancer 2022. His family history includes CVA and MI in his mother.   Cath 2018 Mild to moderate non obstructive CAD. Fick CI 1.9    Echo 11/2020 EF 20-25%, mod-severe Jon, mod TR, RV normal    Echo 03/2021 EF 20-25%, mild Jon, RV not well visualized   Follow up 08/2022 with Dr Elease Hashimoto. Lasix was stopped, then restarted a week later at every other day.   Admitted 09/2022 with a/c HF. Diuresed with IV Lasix and GDMT adjusted. Echo showed EF 20-25%, RV mildly reduced, LA severely dilated, Mod-Severe Jon.    Had follow up with Dr Mosetta Putt 09/2022. CT scan showed no evidence of recurrent cancer. Planned for repeat CT in 8 months.    Admitted 10/2022 a/c HF. Echo showed EF 10-15%, G3DD, RV mildly reduced. R/LHC showed nonobstructive CAD, normal filling pressures, and low cardiac index. Unable to get cMRI as device not compatible. GDMT titrated and he was discharged home, weight 183 lbs.   Recently seen in AHF Clnic on 11/12/22. He returned for post hospital HF follow up with wife. Overall felt fine. No SOB walking on flat ground. Balance was off, and had some chest tightness. Denied palpitations, abnormal bleeding, edema, or PND/Orthopnea. Appetite was ok. No fever or chills. Weight at home was 176 pounds. Reported taking all medications. No smoking/ETOH/drug. Noted his hands tightened up at night.  CPX was scheduled for 11/21/22 but was aborted after he experienced weakness and dizziness (SBP in 90s) while attempting to get into position for the exercise.  Today he returns to HF clinic for pharmacist medication titration. At last visit with APP, no medication changes were  made. However, digoxin level returned after visit with elevated level (1.2). Patient was instructed to hold for 3 days, then resume digoxin at 0.0625 mg daily. Overall feeling pretty good today. No dizziness or lightheadedness. No CP/palpitations. Mentions tightness around his rib cage that has been ongoing for some time now. He believes it could be a long-lasting side effect of chemo. No SOB/DOE. Able to walk 20 minutes and complete rehab exercises (leg lifts, crunches, hand mobility exercises) with no issues. Home weight has been stable between 172-174 lbs. He takes 40 mg of Lasix daily but has needed an additional 40 mg tablet each day for the past three days because of increased fluid in his abdomen. He spent this past weekend celebrating homecoming and consumed more salt than usual. No LEE, PND or orthopnea. Appetite is good. He typically follows a low-salt diet, except for this past weekend. Home SBP fluctuates between 97-110 mmHg. In clinic, BP 98/70 mmHg. His wife brought in paperwork that needs to be completed for his [employer] patient assistance renewal for [employer]. He also mentions needing assistance with Jardiance. Able to obtain [employer] grant for all HF meds today.   HF Medications: Metoprolol succinate 25 mg daily Entresto 24/26 mg BID  Spironolactone 25 mg daily Jardiance 10 mg daily Lasix 40 mg daily Digoxin 0.0625 mg daily  Has the patient been experiencing any side effects to the medications prescribed? No  Does the patient have any problems obtaining medications due to transportation or finances? Yes; [employer] Medicare - requests assistance  with Jardiance. [employer] grant was approved today for all HF meds. Currently receives [employer] through [employer]. Will renew [employer] patient assistance paperwork for [employer]. However, he can use the [employer] for [employer] too.   Understanding of regimen: good Understanding of indications: good Potential of compliance: good Patient  understands to avoid NSAIDs. Patient understands to avoid decongestants.    Pertinent Lab Values: 11/12/22 Serum creatinine 1.48, BUN 22, Potassium 4.3, Sodium 135, BNP 672.4, Magnesium 2.0 (10/30/22), Digoxin 0.2 (11/26/22)   Vital Signs: Weight: 179.4 lbs (last clinic weight: 178 lbs) Blood pressure: 98/70 mmHg  Heart rate: 68   Assessment/Plan: 1. Chronic systolic CHF: Long-standing cardiomyopathy, thought to be nonischemic. St Jude CRT-D device.  Cath in 2018 with nonobstructive CAD but CI at that time was low at 1.9. Lasix was cut back earlier this summer. Admitted 10/2022 with a/c HF. Echo showed EF 10-15%, G3DD, RV mildly reduced. R/LHC showed non obstructive CAD, normal filling pressures and low CO. Unable to get cardiac MRI as his device was not compatible.  - Today, NYHA IIb, euvolemic on exam. No BP room to increase medications today. - Continue Lasix 40 mg daily - Continue metoprolol succinate 25 mg daily  - Continue Entresto 24/26 mg BID. Receives through [employer] PAP.  - Continue spironolactone 25 mg daily - Continue Jardiance 10 mg daily - Continue digoxin 0.0625 mg daily  - CPX was scheduled but was aborted after he experienced weakness and dizziness while attempting to get into position for the exercise. He could potentially be an LVAD candidate down the road; previously discussed with Dr. Shirlee Latch. - Referred to Cardiac Rehab. 2. Atrial fibrillation: Looking back at device interrogations, this looks permanent.   - Rate controlled. Continue Xarelto 20 mg daily.  - Permanent AF, continue metoprolol succinate 25 mg daily 3. Gastric cancer: Treated, now being monitored.  4. Type 2 diabetes: A1C 5.7 (10/30/22) - Continue Jardiance   Follow up with Dr. Shirlee Latch on 02/01/23 + echo.   Karle Plumber, PharmD, BCPS, BCCP, CPP Heart Failure Clinic Pharmacist [phone]

## 2022-12-04 NOTE — Telephone Encounter (Signed)
Advanced Heart Failure Patient Advocate Encounter  The patient was approved for a [employer] grant that will help cover the cost of Digoxin, Entresto, Jardiance, Metoprolol, Spironolactone.  Total amount awarded, $10,000.  Effective: 11/04/2022 - 11/03/2023.  BIN [id number] PCN [id number] Group [id number] ID [id number]  Pharmacy provided with approval and processing information. Patient informed in office.  Burnell Blanks, CPhT Rx Patient Advocate Phone: [phone]

## 2022-12-04 NOTE — Telephone Encounter (Signed)
Advanced Heart Failure Patient Advocate Encounter  Application for Entresto faxed to [employer] on 12/04/2022. Application form attached to patient chart.  This pt does have a grant on file that will cover this medication as well. Sending application at pt request.  Burnell Blanks, CPhT Rx Patient Advocate Phone: [phone]

## 2022-12-05 ENCOUNTER — Encounter (HOSPITAL_COMMUNITY): Payer: Self-pay

## 2022-12-05 DIAGNOSIS — M2042 Other hammer toe(s) (acquired), left foot: Secondary | ICD-10-CM | POA: Diagnosis not present

## 2022-12-05 DIAGNOSIS — L851 Acquired keratosis [keratoderma] palmaris et plantaris: Secondary | ICD-10-CM | POA: Diagnosis not present

## 2022-12-05 DIAGNOSIS — L97512 Non-pressure chronic ulcer of other part of right foot with fat layer exposed: Secondary | ICD-10-CM | POA: Diagnosis not present

## 2022-12-05 DIAGNOSIS — M2041 Other hammer toe(s) (acquired), right foot: Secondary | ICD-10-CM | POA: Diagnosis not present

## 2022-12-05 DIAGNOSIS — I739 Peripheral vascular disease, unspecified: Secondary | ICD-10-CM | POA: Diagnosis not present

## 2022-12-05 DIAGNOSIS — B351 Tinea unguium: Secondary | ICD-10-CM | POA: Diagnosis not present

## 2022-12-05 DIAGNOSIS — M792 Neuralgia and neuritis, unspecified: Secondary | ICD-10-CM | POA: Diagnosis not present

## 2022-12-05 DIAGNOSIS — E1142 Type 2 diabetes mellitus with diabetic polyneuropathy: Secondary | ICD-10-CM | POA: Diagnosis not present

## 2022-12-05 DIAGNOSIS — B353 Tinea pedis: Secondary | ICD-10-CM | POA: Diagnosis not present

## 2022-12-06 ENCOUNTER — Telehealth (HOSPITAL_COMMUNITY): Payer: Self-pay

## 2022-12-06 NOTE — Telephone Encounter (Signed)
Called pt to confirm appt for 12/10/22 at 1330. No answer, LM on VM.  [name], MS, ACSM-CEP 12/06/2022 4:23 PM

## 2022-12-07 ENCOUNTER — Telehealth (HOSPITAL_COMMUNITY): Payer: Self-pay

## 2022-12-07 NOTE — Telephone Encounter (Signed)
  Called pt to confirm appt for 12/10/22 at 1330. Gave pt instructions for appt, what to wear, office address, eating/taking meds before, and if sick to call and reschedule. Pt voiced understanding, all questions answered.   Health history completed? Yes   [name], MS, ACSM-CEP 12/07/2022 5:02 PM

## 2022-12-07 NOTE — Telephone Encounter (Signed)
Pt called and confirmed his appointment on Monday December 10, 2022.

## 2022-12-10 ENCOUNTER — Encounter (HOSPITAL_COMMUNITY)
Admission: RE | Admit: 2022-12-10 | Discharge: 2022-12-10 | Disposition: A | Discharge status: Home / Self Care | Payer: Medicare HMO | Source: Ambulatory Visit | Attending: Cardiology | Admitting: Cardiology

## 2022-12-10 VITALS — BP 108/70 | HR 64 | Ht 66.0 in | Wt 180.1 lb

## 2022-12-10 DIAGNOSIS — I5022 Chronic systolic (congestive) heart failure: Secondary | ICD-10-CM | POA: Insufficient documentation

## 2022-12-10 LAB — GLUCOSE, CAPILLARY: Glucose-Capillary: 103 mg/dL — ABNORMAL HIGH (ref 70–99)

## 2022-12-10 NOTE — Progress Notes (Signed)
Cardiac Individual Treatment Plan  Patient Details  Name: Jon Williams MRN: [id number] Date of Birth: 20-Jan-1951 Referring Provider:   Flowsheet Row INTENSIVE CARDIAC REHAB ORIENT from 12/10/2022 in [facility]  Referring Provider Marca Ancona, MD       Initial Encounter Date:  Flowsheet Row INTENSIVE CARDIAC REHAB ORIENT from 12/10/2022 in [facility]  Date 12/10/22       Visit Diagnosis: Heart failure, chronic systolic (HCC)  Patient's Home Medications on Admission:  Current Outpatient Medications:    atorvastatin (LIPITOR) 40 MG tablet, Take 1 tablet (40 mg total) by mouth daily., Disp: 90 tablet, Rfl: 1   Biotin 1000 MCG tablet, Take 1,000 mcg by mouth daily with breakfast., Disp: , Rfl:    cholecalciferol (VITAMIN D3) 25 MCG (1000 UT) tablet, Take 1,000 Units by mouth daily with breakfast., Disp: , Rfl:    digoxin (LANOXIN) 0.125 MG tablet, Take 0.5 tablets (0.0625 mg total) by mouth daily., Disp: 15 tablet, Rfl: 3   empagliflozin (JARDIANCE) 10 MG TABS tablet, Take 1 tablet (10 mg total) by mouth daily before breakfast., Disp: 30 tablet, Rfl: 11   empagliflozin (JARDIANCE) 10 MG TABS tablet, Take 1 tablet (10 mg total) by mouth daily., Disp: 90 tablet, Rfl: 3   furosemide (LASIX) 40 MG tablet, Take 1 tablet (40 mg total) by mouth daily., Disp: 30 tablet, Rfl: 6   gabapentin (NEURONTIN) 100 MG capsule, Take 1 capsule (100 mg total) by mouth 2 (two) times daily., Disp: 180 capsule, Rfl: 1   ketoconazole (NIZORAL) 2 % cream, Apply 1 Application topically 2 (two) times daily., Disp: 30 g, Rfl: 3   loratadine (CLARITIN) 10 MG tablet, Take 10 mg by mouth daily., Disp: , Rfl:    metFORMIN (GLUCOPHAGE-XR) 500 MG 24 hr tablet, Take 500 mg by mouth every evening., Disp: , Rfl:    metoprolol succinate (TOPROL-XL) 25 MG 24 hr tablet, Take 1 tablet (25 mg total) by  mouth daily., Disp: 90 tablet, Rfl: 1   multivitamin (ONE-A-DAY MEN'S) TABS tablet, Take 1 tablet by mouth daily with breakfast., Disp: , Rfl:    Naftifine HCl (NAFTIN) 2 % GEL, Apply 1 application  topically every Monday, Wednesday, and Friday., Disp: , Rfl:    pantoprazole (PROTONIX) 40 MG tablet, Take 1 tablet (40 mg total) by mouth 2 (two) times daily., Disp: 60 tablet, Rfl: 2   potassium chloride (KLOR-CON) 10 MEQ tablet, Take 2 tablets (20 mEq total) by mouth daily., Disp: 60 tablet, Rfl: 3   sacubitril-valsartan (ENTRESTO) 24-26 MG, Take 1 tablet by mouth 2 (two) times daily., Disp: 180 tablet, Rfl: 1   spironolactone (ALDACTONE) 25 MG tablet, Take 1 tablet (25 mg total) by mouth daily., Disp: 90 tablet, Rfl: 1   Tavaborole 5 % SOLN, Apply 1 application  topically daily. Nail on left hand, Disp: , Rfl:    XARELTO 20 MG TABS tablet, TAKE 1 TABLET BY MOUTH EVERY DAY WITH DINNER (Patient taking differently: Take 20 mg by mouth daily.), Disp: 90 tablet, Rfl: 1  Past Medical History: Past Medical History:  Diagnosis Date   AICD (automatic cardioverter/defibrillator) present 05/25/2016   biv icd   Bunion, right    Chronic combined systolic and diastolic CHF (congestive heart failure) (HCC)    Echo 1/18: Mild conc LVH, EF 15-20, severe diff HK, inf and inf-septal AK, Gr 3 DD, mild to mod MR,  severe LAE, mod reduced RVSF, mod RAE, mild TR, PASP 50   Coronary artery disease involving native coronary artery without angina pectoris 04/17/2016   LHC 1/18: pLCx 30, mLCx 20, mRCA 40, dRCA 20, LVEDP 23, mean RA 8, PA 42/20, PCWP 17   Diabetes mellitus without complication (HCC)    Gastric cancer (HCC)    History of atrial fibrillation    History of atrial flutter    History of cardiomegaly 06/07/2016   Noted on CXR   History of colon polyps 06/28/2017   Noted on colonoscopy   Hypertension    LBBB (left bundle branch block)    Nausea vomiting and diarrhea 04/04/2021   NICM (nonischemic  cardiomyopathy) (HCC)    Echo 1/18:  Mild conc LVH, EF 15-20, severe diff HK, inf and inf-septal AK, Gr 3 DD, mild to mod MR, severe LAE, mod reduced RVSF, mod RAE, mild TR, PASP 50   Other secondary pulmonary hypertension (HCC) 04/17/2016   Prostate cancer (HCC) 2019   Sigmoid diverticulosis 06/28/2017   Noted on colonoscopy    Tobacco Use: Social History   Tobacco Use  Smoking Status Never  Smokeless Tobacco Never    Labs: Review Flowsheet  More data exists      Latest Ref Rng & Units 06/06/2016 12/01/2020 04/01/2021 12/26/2021 10/30/2022  Labs for ITP Cardiac and Pulmonary Rehab  Cholestrol 100 - 199 mg/dL 884  - - - -  LDL (calc) 0 - 99 mg/dL 61  - - - -  HDL-C >16 mg/dL 60  - - - -  Trlycerides 0 - 149 mg/dL 53  - - - -  Hemoglobin A1c 4.8 - 5.6 % - 6.2  6.3  6.1  5.7   Bicarbonate 20.0 - 28.0 mmol/L 20.0 - 28.0 mmol/L - - 24.5  - 22.2  21.3   TCO2 22 - 32 mmol/L 22 - 32 mmol/L - - - - 23  22   Acid-base deficit 0.0 - 2.0 mmol/L 0.0 - 2.0 mmol/L - - - - 2.0  3.0   O2 Saturation % % - - 73.3  58.1  - 65  60     Details       Multiple values from one day are sorted in reverse-chronological order         Capillary Blood Glucose: Lab Results  Component Value Date   GLUCAP 103 (H) 12/10/2022   GLUCAP 100 (H) 10/31/2022   GLUCAP 113 (H) 10/30/2022   GLUCAP 149 (H) 10/30/2022   GLUCAP 80 10/30/2022     Exercise Target Goals: Exercise Program Goal: Individual exercise prescription set using results from initial 6 min walk test and THRR while considering  patient's activity barriers and safety.   Exercise Prescription Goal: Initial exercise prescription builds to 30-45 minutes a day of aerobic activity, 2-3 days per week.  Home exercise guidelines will be given to patient during program as part of exercise prescription that the participant will acknowledge.  Activity Barriers & Risk Stratification:   6 Minute Walk:  6 Minute Walk     Row Name 12/10/22  1522         6 Minute Walk   Phase Initial     Distance 1150 feet     Walk Time 6 minutes     # of Rest Breaks 0     MPH 2.18     METS 2.43     RPE 9     Perceived Dyspnea  0  VO2 Peak 8.49     Symptoms Yes (comment)     Comments no pain, legs fatigued     Resting HR 64 bpm     Resting BP 108/70     Resting Oxygen Saturation  98 %     Exercise Oxygen Saturation  during 6 min walk 99 %     Max Ex. HR 109 bpm     Max Ex. BP 134/70     2 Minute Post BP 113/86              Oxygen Initial Assessment:   Oxygen Re-Evaluation:   Oxygen Discharge (Final Oxygen Re-Evaluation):   Initial Exercise Prescription:  Initial Exercise Prescription - 12/10/22 1500       Date of Initial Exercise RX and Referring Provider   Date 12/10/22    Referring Provider Marca Ancona, MD    Expected Discharge Date 03/06/23      NuStep   Level 1    SPM 70    Minutes 15    METs 2      Prescription Details   Frequency (times per week) 3    Duration Progress to 30 minutes of continuous aerobic without signs/symptoms of physical distress      Intensity   THRR 40-80% of Max Heartrate 58-117    Ratings of Perceived Exertion 11-13    Perceived Dyspnea 0-4      Progression   Progression Continue progressive overload as per policy without signs/symptoms or physical distress.      Resistance Training   Training Prescription Yes    Weight 2    Reps 10-15             Perform Capillary Blood Glucose checks as needed.  Exercise Prescription Changes:   Exercise Comments:   Exercise Goals and Review:   Exercise Goals     Row Name 12/10/22 1328             Exercise Goals   Increase Physical Activity Yes       Intervention Provide advice, education, support and counseling about physical activity/exercise needs.;Develop an individualized exercise prescription for aerobic and resistive training based on initial evaluation findings, risk stratification, comorbidities and  participant's personal goals.       Expected Outcomes Short Term: Attend rehab on a regular basis to increase amount of physical activity.;Long Term: Exercising regularly at least 3-5 days a week.;Long Term: Add in home exercise to make exercise part of routine and to increase amount of physical activity.       Increase Strength and Stamina Yes       Intervention Provide advice, education, support and counseling about physical activity/exercise needs.;Develop an individualized exercise prescription for aerobic and resistive training based on initial evaluation findings, risk stratification, comorbidities and participant's personal goals.       Expected Outcomes Short Term: Increase workloads from initial exercise prescription for resistance, speed, and METs.;Short Term: Perform resistance training exercises routinely during rehab and add in resistance training at home;Long Term: Improve cardiorespiratory fitness, muscular endurance and strength as measured by increased METs and functional capacity ( )       Able to understand and use rate of perceived exertion (RPE) scale Yes       Intervention Provide education and explanation on how to use RPE scale       Expected Outcomes Short Term: Able to use RPE daily in rehab to express subjective intensity level;Long Term:  Able to use RPE to  guide intensity level when exercising independently       Knowledge and understanding of Target Heart Rate Range (THRR) Yes       Intervention Provide education and explanation of THRR including how the numbers were predicted and where they are located for reference       Expected Outcomes Short Term: Able to state/look up THRR;Short Term: Able to use daily as guideline for intensity in rehab;Long Term: Able to use THRR to govern intensity when exercising independently       Understanding of Exercise Prescription Yes       Intervention Provide education, explanation, and written materials on patient's individual exercise  prescription       Expected Outcomes Short Term: Able to explain program exercise prescription;Long Term: Able to explain home exercise prescription to exercise independently                Exercise Goals Re-Evaluation :   Discharge Exercise Prescription (Final Exercise Prescription Changes):   Nutrition:  Target Goals: Understanding of nutrition guidelines, daily intake of sodium 1500mg , cholesterol 200mg , calories 30% from fat and 7% or less from saturated fats, daily to have 5 or more servings of fruits and vegetables.  Biometrics:  Pre Biometrics - 12/10/22 1356       Pre Biometrics   Waist Circumference 40.5 inches    Hip Circumference 40 inches    Waist to Hip Ratio 1.01 %    Triceps Skinfold 20 mm    % Body Fat 29.9 %    Grip Strength 24 kg    Flexibility 14 in    Single Leg Stand 0 seconds   did not attempt, pt has poor balance             Nutrition Therapy Plan and Nutrition Goals:   Nutrition Assessments:  MEDIFICTS Score Key: >=70 Need to make dietary changes  40-70 Heart Healthy Diet <= 40 Therapeutic Level Cholesterol Diet    Picture Your Plate Scores: <40 Unhealthy dietary pattern with much room for improvement. 41-50 Dietary pattern unlikely to meet recommendations for good health and room for improvement. 51-60 More healthful dietary pattern, with some room for improvement.  >60 Healthy dietary pattern, although there may be some specific behaviors that could be improved.    Nutrition Goals Re-Evaluation:   Nutrition Goals Re-Evaluation:   Nutrition Goals Discharge (Final Nutrition Goals Re-Evaluation):   Psychosocial: Target Goals: Acknowledge presence or absence of significant depression and/or stress, maximize coping skills, provide positive support system. Participant is able to verbalize types and ability to use techniques and skills needed for reducing stress and depression.  Initial Review & Psychosocial Screening:   Initial Psych Review & Screening - 12/10/22 1411       Initial Review   Current issues with None Identified      Family Dynamics   Good Support System? Yes   Skeeter has his wife for support     Barriers   Psychosocial barriers to participate in program There are no identifiable barriers or psychosocial needs.      Screening Interventions   Interventions Encouraged to exercise;Provide feedback about the scores to participant    Expected Outcomes Short Term goal: Identification and review with participant of any Quality of Life or Depression concerns found by scoring the questionnaire.;Long Term goal: The participant improves quality of Life and PHQ9 Scores as seen by post scores and/or verbalization of changes  Quality of Life Scores:  Quality of Life - 12/10/22 1431       Quality of Life   Select Quality of Life      Quality of Life Scores   Health/Function Pre 26.23 %    Socioeconomic Pre 29 %    Psych/Spiritual Pre 24.86 %    Family Pre 30 %    GLOBAL Pre 27.02 %            Scores of 19 and below usually indicate a poorer quality of life in these areas.  A difference of  2-3 points is a clinically meaningful difference.  A difference of 2-3 points in the total score of the Quality of Life Index has been associated with significant improvement in overall quality of life, self-image, physical symptoms, and general health in studies assessing change in quality of life.  PHQ-9: Review Flowsheet       12/10/2022 12/03/2019 04/02/2017  Depression screen PHQ 2/9  Decreased Interest 0 0   Down, Depressed, Hopeless 0 0   PHQ - 2 Score 0 0   Altered sleeping 0 - -  Tired, decreased energy 0 - -  Change in appetite 0 - -  Feeling bad or failure about yourself  0 - -  Trouble concentrating 0 - -  Moving slowly or fidgety/restless 0 - -  Suicidal thoughts 0 - -  PHQ-9 Score 0 - -    Details       Information is confidential and restricted. Go to Review  Flowsheets to unlock data.        Interpretation of Total Score  Total Score Depression Severity:  1-4 = Minimal depression, 5-9 = Mild depression, 10-14 = Moderate depression, 15-19 = Moderately severe depression, 20-27 = Severe depression   Psychosocial Evaluation and Intervention:   Psychosocial Re-Evaluation:   Psychosocial Discharge (Final Psychosocial Re-Evaluation):   Vocational Rehabilitation: Provide vocational rehab assistance to qualifying candidates.   Vocational Rehab Evaluation & Intervention:  Vocational Rehab - 12/10/22 1408       Initial Vocational Rehab Evaluation & Intervention   Assessment shows need for Vocational Rehabilitation No   Rajender is retired            Education: Education Goals: Education classes will be provided on a weekly basis, covering required topics. Participant will state understanding/return demonstration of topics presented.     Core Videos: Exercise    Move It!  Clinical staff conducted group or individual video education with verbal and written material and guidebook.  Patient learns the recommended Pritikin exercise program. Exercise with the goal of living a long, healthy life. Some of the health benefits of exercise include controlled diabetes, healthier blood pressure levels, improved cholesterol levels, improved heart and lung capacity, improved sleep, and better body composition. Everyone should speak with their doctor before starting or changing an exercise routine.  Biomechanical Limitations Clinical staff conducted group or individual video education with verbal and written material and guidebook.  Patient learns how biomechanical limitations can impact exercise and how we can mitigate and possibly overcome limitations to have an impactful and balanced exercise routine.  Body Composition Clinical staff conducted group or individual video education with verbal and written material and guidebook.  Patient learns  that body composition (ratio of muscle mass to fat mass) is a key component to assessing overall fitness, rather than body weight alone. Increased fat mass, especially visceral belly fat, can put [place] at increased risk for metabolic syndrome, type 2  diabetes, heart disease, and even death. It is recommended to combine diet and exercise (cardiovascular and resistance training) to improve your body composition. Seek guidance from your physician and exercise physiologist before implementing an exercise routine.  Exercise Action Plan Clinical staff conducted group or individual video education with verbal and written material and guidebook.  Patient learns the recommended strategies to achieve and enjoy long-term exercise adherence, including variety, self-motivation, self-efficacy, and positive decision making. Benefits of exercise include fitness, good health, weight management, more energy, better sleep, less stress, and overall well-being.  Medical   Heart Disease Risk Reduction Clinical staff conducted group or individual video education with verbal and written material and guidebook.  Patient learns our heart is our most vital organ as it circulates oxygen, nutrients, white blood cells, and hormones throughout the entire body, and carries waste away. Data supports a plant-based eating plan like the Pritikin Program for its effectiveness in slowing progression of and reversing heart disease. The video provides a number of recommendations to address heart disease.   Metabolic Syndrome and Belly Fat  Clinical staff conducted group or individual video education with verbal and written material and guidebook.  Patient learns what metabolic syndrome is, how it leads to heart disease, and how one can reverse it and keep it from coming back. You have metabolic syndrome if you have 3 of the following 5 criteria: abdominal obesity, high blood pressure, high triglycerides, low HDL cholesterol, and high blood  sugar.  Hypertension and Heart Disease Clinical staff conducted group or individual video education with verbal and written material and guidebook.  Patient learns that high blood pressure, or hypertension, is very common in the [place]. Hypertension is largely due to excessive salt intake, but other important risk factors include being overweight, physical inactivity, drinking too much alcohol, smoking, and not eating enough potassium from fruits and vegetables. High blood pressure is a leading risk factor for heart attack, stroke, congestive heart failure, dementia, kidney failure, and premature death. Long-term effects of excessive salt intake include stiffening of the arteries and thickening of heart muscle and organ damage. Recommendations include ways to reduce hypertension and the risk of heart disease.  Diseases of Our Time - Focusing on Diabetes Clinical staff conducted group or individual video education with verbal and written material and guidebook.  Patient learns why the best way to stop diseases of our time is prevention, through food and other lifestyle changes. Medicine (such as prescription pills and surgeries) is often only a Band-Aid on the problem, not a long-term solution. Most common diseases of our time include obesity, type 2 diabetes, hypertension, heart disease, and cancer. The Pritikin Program is recommended and has been proven to help reduce, reverse, and/or prevent the damaging effects of metabolic syndrome.  Nutrition   Overview of the Pritikin Eating Plan  Clinical staff conducted group or individual video education with verbal and written material and guidebook.  Patient learns about the Pritikin Eating Plan for disease risk reduction. The Pritikin Eating Plan emphasizes a wide variety of unrefined, minimally-processed carbohydrates, like fruits, vegetables, whole grains, and legumes. Go, Caution, and Stop food choices are explained. Plant-based and lean animal  proteins are emphasized. Rationale provided for low sodium intake for blood pressure control, low added sugars for blood sugar stabilization, and low added fats and oils for coronary artery disease risk reduction and weight management.  Calorie Density  Clinical staff conducted group or individual video education with verbal and written material and guidebook.  Patient  learns about calorie density and how it impacts the Pritikin Eating Plan. Knowing the characteristics of the food you choose will help you decide whether those foods will lead to weight gain or weight loss, and whether you want to consume more or less of them. Weight loss is usually a side effect of the Pritikin Eating Plan because of its focus on low calorie-dense foods.  Label Reading  Clinical staff conducted group or individual video education with verbal and written material and guidebook.  Patient learns about the Pritikin recommended label reading guidelines and corresponding recommendations regarding calorie density, added sugars, sodium content, and whole grains.  Dining Out - Part 1  Clinical staff conducted group or individual video education with verbal and written material and guidebook.  Patient learns that restaurant meals can be sabotaging because they can be so high in calories, fat, sodium, and/or sugar. Patient learns recommended strategies on how to positively address this and avoid unhealthy pitfalls.  Facts on Fats  Clinical staff conducted group or individual video education with verbal and written material and guidebook.  Patient learns that lifestyle modifications can be just as effective, if not more so, as many medications for lowering your risk of heart disease. A Pritikin lifestyle can help to reduce your risk of inflammation and atherosclerosis (cholesterol build-up, or plaque, in the artery walls). Lifestyle interventions such as dietary choices and physical activity address the cause of atherosclerosis.  A review of the types of fats and their impact on blood cholesterol levels, along with dietary recommendations to reduce fat intake is also included.  Nutrition Action Plan  Clinical staff conducted group or individual video education with verbal and written material and guidebook.  Patient learns how to incorporate Pritikin recommendations into their lifestyle. Recommendations include planning and keeping personal health goals in mind as an important part of their success.  Healthy Mind-Set    Healthy Minds, Bodies, Hearts  Clinical staff conducted group or individual video education with verbal and written material and guidebook.  Patient learns how to identify when they are stressed. Video will discuss the impact of that stress, as well as the many benefits of stress management. Patient will also be introduced to stress management techniques. The way we think, act, and feel has an impact on our hearts.  How Our Thoughts Can Heal Our Hearts  Clinical staff conducted group or individual video education with verbal and written material and guidebook.  Patient learns that negative thoughts can cause depression and anxiety. This can result in negative lifestyle behavior and serious health problems. Cognitive behavioral therapy is an effective method to help control our thoughts in order to change and improve our emotional outlook.  Additional Videos:  Exercise    Improving Performance  Clinical staff conducted group or individual video education with verbal and written material and guidebook.  Patient learns to use a non-linear approach by alternating intensity levels and lengths of time spent exercising to help burn more calories and lose more body fat. Cardiovascular exercise helps improve heart health, metabolism, hormonal balance, blood sugar control, and recovery from fatigue. Resistance training improves strength, endurance, balance, coordination, reaction time, metabolism, and muscle mass.  Flexibility exercise improves circulation, posture, and balance. Seek guidance from your physician and exercise physiologist before implementing an exercise routine and learn your capabilities and proper form for all exercise.  Introduction to Yoga  Clinical staff conducted group or individual video education with verbal and written material and guidebook.  Patient learns about yoga,  a discipline of the coming together of mind, breath, and body. The benefits of yoga include improved flexibility, improved range of motion, better posture and core strength, increased lung function, weight loss, and positive self-image. Yoga's heart health benefits include lowered blood pressure, healthier heart rate, decreased cholesterol and triglyceride levels, improved immune function, and reduced stress. Seek guidance from your physician and exercise physiologist before implementing an exercise routine and learn your capabilities and proper form for all exercise.  Medical   Aging: Enhancing Your Quality of Life  Clinical staff conducted group or individual video education with verbal and written material and guidebook.  Patient learns key strategies and recommendations to stay in good physical health and enhance quality of life, such as prevention strategies, having an advocate, securing a Health Care Proxy and Power of Attorney, and keeping a list of medications and system for tracking them. It also discusses how to avoid risk for bone loss.  Biology of Weight Control  Clinical staff conducted group or individual video education with verbal and written material and guidebook.  Patient learns that weight gain occurs because we consume more calories than we burn (eating more, moving less). Even if your body weight is normal, you may have higher ratios of fat compared to muscle mass. Too much body fat puts you at increased risk for cardiovascular disease, heart attack, stroke, type 2 diabetes, and obesity-related  cancers. In addition to exercise, following the Pritikin Eating Plan can help reduce your risk.  Decoding Lab Results  Clinical staff conducted group or individual video education with verbal and written material and guidebook.  Patient learns that lab test reflects one measurement whose values change over time and are influenced by many factors, including medication, stress, sleep, exercise, food, hydration, pre-existing medical conditions, and more. It is recommended to use the knowledge from this video to become more involved with your lab results and evaluate your numbers to speak with your doctor.   Diseases of Our Time - Overview  Clinical staff conducted group or individual video education with verbal and written material and guidebook.  Patient learns that according to the CDC, 50% to 70% of chronic diseases (such as obesity, type 2 diabetes, elevated lipids, hypertension, and heart disease) are avoidable through lifestyle improvements including healthier food choices, listening to satiety cues, and increased physical activity.  Sleep Disorders Clinical staff conducted group or individual video education with verbal and written material and guidebook.  Patient learns how good quality and duration of sleep are important to overall health and well-being. Patient also learns about sleep disorders and how they impact health along with recommendations to address them, including discussing with a physician.  Nutrition  Dining Out - Part 2 Clinical staff conducted group or individual video education with verbal and written material and guidebook.  Patient learns how to plan ahead and communicate in order to maximize their dining experience in a healthy and nutritious manner. Included are recommended food choices based on the type of restaurant the patient is visiting.   Fueling a Banker conducted group or individual video education with verbal and written material and  guidebook.  There is a strong connection between our food choices and our health. Diseases like obesity and type 2 diabetes are very prevalent and are in large-part due to lifestyle choices. The Pritikin Eating Plan provides plenty of food and hunger-curbing satisfaction. It is easy to follow, affordable, and helps reduce health risks.  Menu Workshop  Clinical staff  conducted group or individual video education with verbal and written material and guidebook.  Patient learns that restaurant meals can sabotage health goals because they are often packed with calories, fat, sodium, and sugar. Recommendations include strategies to plan ahead and to communicate with the manager, chef, or server to help order a healthier meal.  Planning Your Eating Strategy  Clinical staff conducted group or individual video education with verbal and written material and guidebook.  Patient learns about the Pritikin Eating Plan and its benefit of reducing the risk of disease. The Pritikin Eating Plan does not focus on calories. Instead, it emphasizes high-quality, nutrient-rich foods. By knowing the characteristics of the foods, we choose, we can determine their calorie density and make informed decisions.  Targeting Your Nutrition Priorities  Clinical staff conducted group or individual video education with verbal and written material and guidebook.  Patient learns that lifestyle habits have a tremendous impact on disease risk and progression. This video provides eating and physical activity recommendations based on your personal health goals, such as reducing LDL cholesterol, losing weight, preventing or controlling type 2 diabetes, and reducing high blood pressure.  Vitamins and Minerals  Clinical staff conducted group or individual video education with verbal and written material and guidebook.  Patient learns different ways to obtain key vitamins and minerals, including through a recommended healthy diet. It is  important to discuss all supplements you take with your doctor.   Healthy Mind-Set    Smoking Cessation  Clinical staff conducted group or individual video education with verbal and written material and guidebook.  Patient learns that cigarette smoking and tobacco addiction pose a serious health risk which affects millions of people. Stopping smoking will significantly reduce the risk of heart disease, lung disease, and many forms of cancer. Recommended strategies for quitting are covered, including working with your doctor to develop a successful plan.  Culinary   Becoming a Set designer conducted group or individual video education with verbal and written material and guidebook.  Patient learns that cooking at home can be healthy, cost-effective, quick, and puts them in control. Keys to cooking healthy recipes will include looking at your recipe, assessing your equipment needs, planning ahead, making it simple, choosing cost-effective seasonal ingredients, and limiting the use of added fats, salts, and sugars.  Cooking - Breakfast and Snacks  Clinical staff conducted group or individual video education with verbal and written material and guidebook.  Patient learns how important breakfast is to satiety and nutrition through the entire day. Recommendations include key foods to eat during breakfast to help stabilize blood sugar levels and to prevent overeating at meals later in the day. Planning ahead is also a key component.  Cooking - Educational psychologist conducted group or individual video education with verbal and written material and guidebook.  Patient learns eating strategies to improve overall health, including an approach to cook more at home. Recommendations include thinking of animal protein as a side on your plate rather than center stage and focusing instead on lower calorie dense options like vegetables, fruits, whole grains, and plant-based proteins,  such as beans. Making sauces in large quantities to freeze for later and leaving the skin on your vegetables are also recommended to maximize your experience.  Cooking - Healthy Salads and Dressing Clinical staff conducted group or individual video education with verbal and written material and guidebook.  Patient learns that vegetables, fruits, whole grains, and legumes are the foundations of the  Pritikin Eating Plan. Recommendations include how to incorporate each of these in flavorful and healthy salads, and how to create homemade salad dressings. Proper handling of ingredients is also covered. Cooking - Soups and [employer] - Soups and Desserts Clinical staff conducted group or individual video education with verbal and written material and guidebook.  Patient learns that Pritikin soups and desserts make for easy, nutritious, and delicious snacks and meal components that are low in sodium, fat, sugar, and calorie density, while high in vitamins, minerals, and filling fiber. Recommendations include simple and healthy ideas for soups and desserts.   Overview     The Pritikin Solution Program Overview Clinical staff conducted group or individual video education with verbal and written material and guidebook.  Patient learns that the results of the Pritikin Program have been documented in more than 100 articles published in peer-reviewed journals, and the benefits include reducing risk factors for (and, in some cases, even reversing) high cholesterol, high blood pressure, type 2 diabetes, obesity, and more! An overview of the three key pillars of the Pritikin Program will be covered: eating well, doing regular exercise, and having a healthy mind-set.  WORKSHOPS  Exercise: Exercise Basics: Building Your Action Plan Clinical staff led group instruction and group discussion with PowerPoint presentation and patient guidebook. To enhance the learning environment the use of posters, models and  videos may be added. At the conclusion of this workshop, patients will comprehend the difference between physical activity and exercise, as well as the benefits of incorporating both, into their routine. Patients will understand the FITT (Frequency, Intensity, Time, and Type) principle and how to use it to build an exercise action plan. In addition, safety concerns and other considerations for exercise and cardiac rehab will be addressed by the presenter. The purpose of this lesson is to promote a comprehensive and effective weekly exercise routine in order to improve patients' overall level of fitness.   Managing Heart Disease: Your Path to a Healthier Heart Clinical staff led group instruction and group discussion with PowerPoint presentation and patient guidebook. To enhance the learning environment the use of posters, models and videos may be added.At the conclusion of this workshop, patients will understand the anatomy and physiology of the heart. Additionally, they will understand how Pritikin's three pillars impact the risk factors, the progression, and the management of heart disease.  The purpose of this lesson is to provide a high-level overview of the heart, heart disease, and how the Pritikin lifestyle positively impacts risk factors.  Exercise Biomechanics Clinical staff led group instruction and group discussion with PowerPoint presentation and patient guidebook. To enhance the learning environment the use of posters, models and videos may be added. Patients will learn how the structural parts of their bodies function and how these functions impact their daily activities, movement, and exercise. Patients will learn how to promote a neutral spine, learn how to manage pain, and identify ways to improve their physical movement in order to promote healthy living. The purpose of this lesson is to expose patients to common physical limitations that impact physical activity. Participants  will learn practical ways to adapt and manage aches and pains, and to minimize their effect on regular exercise. Patients will learn how to maintain good posture while sitting, walking, and lifting.  Balance Training and Fall Prevention  Clinical staff led group instruction and group discussion with PowerPoint presentation and patient guidebook. To enhance the learning environment the use of posters, models and videos may be  added. At the conclusion of this workshop, patients will understand the importance of their sensorimotor skills (vision, proprioception, and the vestibular system) in maintaining their ability to balance as they age. Patients will apply a variety of balancing exercises that are appropriate for their current level of function. Patients will understand the common causes for poor balance, possible solutions to these problems, and ways to modify their physical environment in order to minimize their fall risk. The purpose of this lesson is to teach patients about the importance of maintaining balance as they age and ways to minimize their risk of falling.  WORKSHOPS   Nutrition:  Fueling a Ship broker led group instruction and group discussion with PowerPoint presentation and patient guidebook. To enhance the learning environment the use of posters, models and videos may be added. Patients will review the foundational principles of the Pritikin Eating Plan and understand what constitutes a serving size in each of the food groups. Patients will also learn Pritikin-friendly foods that are better choices when away from home and review make-ahead meal and snack options. Calorie density will be reviewed and applied to three nutrition priorities: weight maintenance, weight loss, and weight gain. The purpose of this lesson is to reinforce (in a group setting) the key concepts around what patients are recommended to eat and how to apply these guidelines when away from home by  planning and selecting Pritikin-friendly options. Patients will understand how calorie density may be adjusted for different weight management goals.  Mindful Eating  Clinical staff led group instruction and group discussion with PowerPoint presentation and patient guidebook. To enhance the learning environment the use of posters, models and videos may be added. Patients will briefly review the concepts of the Pritikin Eating Plan and the importance of low-calorie dense foods. The concept of mindful eating will be introduced as well as the importance of paying attention to internal hunger signals. Triggers for non-hunger eating and techniques for dealing with triggers will be explored. The purpose of this lesson is to provide patients with the opportunity to review the basic principles of the Pritikin Eating Plan, discuss the value of eating mindfully and how to measure internal cues of hunger and fullness using the Hunger Scale. Patients will also discuss reasons for non-hunger eating and learn strategies to use for controlling emotional eating.  Targeting Your Nutrition Priorities Clinical staff led group instruction and group discussion with PowerPoint presentation and patient guidebook. To enhance the learning environment the use of posters, models and videos may be added. Patients will learn how to determine their genetic susceptibility to disease by reviewing their family history. Patients will gain insight into the importance of diet as part of an overall healthy lifestyle in mitigating the impact of genetics and other environmental insults. The purpose of this lesson is to provide patients with the opportunity to assess their personal nutrition priorities by looking at their family history, their own health history and current risk factors. Patients will also be able to discuss ways of prioritizing and modifying the Pritikin Eating Plan for their highest risk areas  Menu  Clinical staff led group  instruction and group discussion with PowerPoint presentation and patient guidebook. To enhance the learning environment the use of posters, models and videos may be added. Using menus brought in from [employer], or printed from [employer], patients will apply the Pritikin dining out guidelines that were presented in the [employer] video. Patients will also be able to practice these  guidelines in a variety of provided scenarios. The purpose of this lesson is to provide patients with the opportunity to practice hands-on learning of the Pritikin Dining Out guidelines with actual menus and practice scenarios.  Label Reading Clinical staff led group instruction and group discussion with PowerPoint presentation and patient guidebook. To enhance the learning environment the use of posters, models and videos may be added. Patients will review and discuss the Pritikin label reading guidelines presented in Pritikin's Label Reading Educational series video. Using fool labels brought in from local grocery stores and markets, patients will apply the label reading guidelines and determine if the packaged food meet the Pritikin guidelines. The purpose of this lesson is to provide patients with the opportunity to review, discuss, and practice hands-on learning of the Pritikin Label Reading guidelines with actual packaged food labels. Cooking School  Pritikin's [employer] are designed to teach patients ways to prepare quick, simple, and affordable recipes at home. The importance of nutrition's role in chronic disease risk reduction is reflected in its emphasis in the overall Pritikin program. By learning how to prepare essential core Pritikin Eating Plan recipes, patients will increase control over what they eat; be able to customize the flavor of foods without the use of added salt, sugar, or fat; and improve the quality of the food they consume. By learning a set of core recipes  which are easily assembled, quickly prepared, and affordable, patients are more likely to prepare more healthy foods at home. These workshops focus on convenient breakfasts, simple entres, side dishes, and desserts which can be prepared with minimal effort and are consistent with nutrition recommendations for cardiovascular risk reduction. Cooking [employer] are taught by a Armed forces logistics/support/administrative officer (RD) who has been trained by the [employer]. The chef or RD has a clear understanding of the importance of minimizing - if not completely eliminating - added fat, sugar, and sodium in recipes. Throughout the series of Cooking School Workshop sessions, patients will learn about healthy ingredients and efficient methods of cooking to build confidence in their capability to prepare    Cooking School weekly topics:  Adding Flavor- Sodium-Free  Fast and Healthy Breakfasts  Powerhouse Plant-Based Proteins  Satisfying Salads and Dressings  Simple Sides and Sauces  International Cuisine-Spotlight on the [employer] Zones  Delicious Desserts  Savory Soups  [employer] - Meals in a Astronomer Appetizers and Snacks  Comforting Weekend Breakfasts  One-Pot Wonders   Fast Evening Meals  Landscape architect Your Pritikin Plate  WORKSHOPS   Healthy Mindset (Psychosocial):  Focused Goals, Sustainable Changes Clinical staff led group instruction and group discussion with PowerPoint presentation and patient guidebook. To enhance the learning environment the use of posters, models and videos may be added. Patients will be able to apply effective goal setting strategies to establish at least one personal goal, and then take consistent, meaningful action toward that goal. They will learn to identify common barriers to achieving personal goals and develop strategies to overcome them. Patients will also gain an understanding of how our mind-set can impact our ability to achieve  goals and the importance of cultivating a positive and growth-oriented mind-set. The purpose of this lesson is to provide patients with a deeper understanding of how to set and achieve personal goals, as well as the tools and strategies needed to overcome common obstacles which may arise along the way.  From Head to Heart: The Power of a Healthy Outlook  Clinical staff led group instruction and group discussion with PowerPoint presentation and patient guidebook. To enhance the learning environment the use of posters, models and videos may be added. Patients will be able to recognize and describe the impact of emotions and mood on physical health. They will discover the importance of self-care and explore self-care practices which may work for them. Patients will also learn how to utilize the 4 C's to cultivate a healthier outlook and better manage stress and challenges. The purpose of this lesson is to demonstrate to patients how a healthy outlook is an essential part of maintaining good health, especially as they continue their cardiac rehab journey.  Healthy Sleep for a Healthy Heart Clinical staff led group instruction and group discussion with PowerPoint presentation and patient guidebook. To enhance the learning environment the use of posters, models and videos may be added. At the conclusion of this workshop, patients will be able to demonstrate knowledge of the importance of sleep to overall health, well-being, and quality of life. They will understand the symptoms of, and treatments for, common sleep disorders. Patients will also be able to identify daytime and nighttime behaviors which impact sleep, and they will be able to apply these tools to help manage sleep-related challenges. The purpose of this lesson is to provide patients with a general overview of sleep and outline the importance of quality sleep. Patients will learn about a few of the most common sleep disorders. Patients will also be  introduced to the concept of "sleep hygiene," and discover ways to self-manage certain sleeping problems through simple daily behavior changes. Finally, the workshop will motivate patients by clarifying the links between quality sleep and their goals of heart-healthy living.   Recognizing and Reducing Stress Clinical staff led group instruction and group discussion with PowerPoint presentation and patient guidebook. To enhance the learning environment the use of posters, models and videos may be added. At the conclusion of this workshop, patients will be able to understand the types of stress reactions, differentiate between acute and chronic stress, and recognize the impact that chronic stress has on their health. They will also be able to apply different coping mechanisms, such as reframing negative self-talk. Patients will have the opportunity to practice a variety of stress management techniques, such as deep abdominal breathing, progressive muscle relaxation, and/or guided imagery.  The purpose of this lesson is to educate patients on the role of stress in their lives and to provide healthy techniques for coping with it.  Learning Barriers/Preferences:  Learning Barriers/Preferences - 12/10/22 1409       Learning Barriers/Preferences   Learning Barriers Sight   reading glasses   Learning Preferences Audio;Computer/Internet;Group Instruction;Pictoral;Individual Instruction;Skilled Demonstration;Verbal Instruction;Video;Written Material             Education Topics:  Knowledge Questionnaire Score:  Knowledge Questionnaire Score - 12/10/22 1407       Knowledge Questionnaire Score   Pre Score 18/24             Core Components/Risk Factors/Patient Goals at Admission:  Personal Goals and Risk Factors at Admission - 12/10/22 1403       Core Components/Risk Factors/Patient Goals on Admission    Weight Management Yes    Intervention Weight Management: Develop a combined nutrition  and exercise program designed to reach desired caloric intake, while maintaining appropriate intake of nutrient and fiber, sodium and fats, and appropriate energy expenditure required for the weight goal.;Weight Management: Provide education and appropriate resources to help participant work  on and attain dietary goals.    Expected Outcomes Short Term: Continue to assess and modify interventions until short term weight is achieved;Long Term: Adherence to nutrition and physical activity/exercise program aimed toward attainment of established weight goal;Understanding recommendations for meals to include 15-35% energy as protein, 25-35% energy from fat, 35-60% energy from carbohydrates, less than 200mg  of dietary cholesterol, 20-35 gm of total fiber daily;Understanding of distribution of calorie intake throughout the day with the consumption of 4-5 meals/snacks    Diabetes Yes    Intervention Provide education about signs/symptoms and action to take for hypo/hyperglycemia.;Provide education about proper nutrition, including hydration, and aerobic/resistive exercise prescription along with prescribed medications to achieve blood glucose in normal ranges: Fasting glucose 65-99 mg/dL    Expected Outcomes Short Term: Participant verbalizes understanding of the signs/symptoms and immediate care of hyper/hypoglycemia, proper foot care and importance of medication, aerobic/resistive exercise and nutrition plan for blood glucose control.;Long Term: Attainment of HbA1C < 7%.    Heart Failure Yes    Intervention Provide a combined exercise and nutrition program that is supplemented with education, support and counseling about heart failure. Directed toward relieving symptoms such as shortness of breath, decreased exercise tolerance, and extremity edema.    Expected Outcomes Improve functional capacity of life;Short term: Attendance in program 2-3 days a week with increased exercise capacity. Reported lower sodium intake.  Reported increased fruit and vegetable intake. Reports medication compliance.;Short term: Daily weights obtained and reported for increase. Utilizing diuretic protocols set by physician.;Long term: Adoption of self-care skills and reduction of barriers for early signs and symptoms recognition and intervention leading to self-care maintenance.    Hypertension Yes    Intervention Monitor prescription use compliance.;Provide education on lifestyle modifcations including regular physical activity/exercise, weight management, moderate sodium restriction and increased consumption of fresh fruit, vegetables, and low fat dairy, alcohol moderation, and smoking cessation.    Expected Outcomes Short Term: Continued assessment and intervention until BP is < 140/36mm HG in hypertensive participants. < 130/83mm HG in hypertensive participants with diabetes, heart failure or chronic kidney disease.;Long Term: Maintenance of blood pressure at goal levels.    Lipids Yes    Intervention Provide education and support for participant on nutrition & aerobic/resistive exercise along with prescribed medications to achieve LDL 70mg , HDL >40mg .    Expected Outcomes Short Term: Participant states understanding of desired cholesterol values and is compliant with medications prescribed. Participant is following exercise prescription and nutrition guidelines.;Long Term: Cholesterol controlled with medications as prescribed, with individualized exercise RX and with personalized nutrition plan. Value goals: LDL < 70mg , HDL > 40 mg.             Core Components/Risk Factors/Patient Goals Review:    Core Components/Risk Factors/Patient Goals at Discharge (Final Review):    ITP Comments:  ITP Comments     Row Name 12/10/22 1327           ITP Comments Dr. Armanda Magic medical director. Introduction to [facility] education/ [facility] cardiac rehab. Initial orientation packet reviewed with patient.                 Comments: Participant attended orientation for the cardiac rehabilitation program on  12/10/2022  to perform initial intake and exercise walk test. Patient introduced to the [facility] education and orientation packet was reviewed. Completed 6-minute walk test, measurements, initial ITP, and exercise prescription. Vital signs stable. Telemetry-V-paced, PVCs, sinus arrhythmias, LBBB , asymptomatic.   Service time was from 1330 to 1545.  [name], MS, ACSM-CEP 12/10/2022 3:27 PM

## 2022-12-10 NOTE — Progress Notes (Signed)
Cardiac Rehab Medication Review   Does the patient  feel that his/her medications are working for him/her?  YES   Has the patient been experiencing any side effects to the medications prescribed?  NO  Does the patient measure his/her own blood pressure or blood glucose at home?  YES    Does the patient have any problems obtaining medications due to transportation or finances?   NO   Understanding of regimen: fair Understanding of indications: fair Potential of compliance: excellent    Comments: Jon Williams has a fair understanding of his medications but his wife is mainly keeping track of his regime. He checks his BP, CBG, and weight daily.    [name], MS, ACSM-CEP 12/10/2022 2:00 PM

## 2022-12-11 NOTE — Telephone Encounter (Signed)
Contacted [employer] for status update, pt has been approved to receive Entresto from [employer] from 12/04/2022 to 02/12/2024. Spoke to patient by phone and confirmed that the Jon Williams that is active will cover the cost of the medication at his local pharmacy, or he is welcome to reach out to [employer] to schedule his first shipment. Pt expressed understanding.

## 2022-12-12 ENCOUNTER — Other Ambulatory Visit (HOSPITAL_COMMUNITY): Payer: Self-pay

## 2022-12-12 ENCOUNTER — Inpatient Hospital Stay: Payer: Medicare HMO | Attending: Nurse Practitioner

## 2022-12-12 ENCOUNTER — Encounter: Payer: Self-pay | Admitting: Hematology

## 2022-12-12 DIAGNOSIS — Z95828 Presence of other vascular implants and grafts: Secondary | ICD-10-CM

## 2022-12-12 DIAGNOSIS — D500 Iron deficiency anemia secondary to blood loss (chronic): Secondary | ICD-10-CM

## 2022-12-12 DIAGNOSIS — C169 Malignant neoplasm of stomach, unspecified: Secondary | ICD-10-CM | POA: Diagnosis not present

## 2022-12-12 DIAGNOSIS — C61 Malignant neoplasm of prostate: Secondary | ICD-10-CM

## 2022-12-12 LAB — CMP (CANCER CENTER ONLY)
ALT: 22 U/L (ref 0–44)
AST: 22 U/L (ref 15–41)
Albumin: 4.6 g/dL (ref 3.5–5.0)
Alkaline Phosphatase: 42 U/L (ref 38–126)
Anion gap: 7 (ref 5–15)
BUN: 26 mg/dL — ABNORMAL HIGH (ref 8–23)
CO2: 27 mmol/L (ref 22–32)
Calcium: 9.8 mg/dL (ref 8.9–10.3)
Chloride: 101 mmol/L (ref 98–111)
Creatinine: 1.27 mg/dL — ABNORMAL HIGH (ref 0.61–1.24)
GFR, Estimated: 59 mL/min — ABNORMAL LOW (ref >60)
Glucose, Bld: 106 mg/dL — ABNORMAL HIGH (ref 70–99)
Potassium: 4.3 mmol/L (ref 3.5–5.1)
Sodium: 135 mmol/L (ref 135–145)
Total Bilirubin: 0.9 mg/dL (ref 0.3–1.2)
Total Protein: 7.5 g/dL (ref 6.5–8.1)

## 2022-12-12 LAB — CBC WITH DIFFERENTIAL (CANCER CENTER ONLY)
Abs Immature Granulocytes: 0.01 10*3/uL (ref 0.00–0.07)
Basophils Absolute: 0 10*3/uL (ref 0.0–0.1)
Basophils Relative: 0 %
Eosinophils Absolute: 0.3 10*3/uL (ref 0.0–0.5)
Eosinophils Relative: 5 %
HCT: 40.4 % (ref 39.0–52.0)
Hemoglobin: 13.6 g/dL (ref 13.0–17.0)
Immature Granulocytes: 0 %
Lymphocytes Relative: 24 %
Lymphs Abs: 1.3 10*3/uL (ref 0.7–4.0)
MCH: 32.6 pg (ref 26.0–34.0)
MCHC: 33.7 g/dL (ref 30.0–36.0)
MCV: 96.9 fL (ref 80.0–100.0)
Monocytes Absolute: 0.6 10*3/uL (ref 0.1–1.0)
Monocytes Relative: 11 %
Neutro Abs: 3.2 10*3/uL (ref 1.7–7.7)
Neutrophils Relative %: 60 %
Platelet Count: 197 10*3/uL (ref 150–400)
RBC: 4.17 MIL/uL — ABNORMAL LOW (ref 4.22–5.81)
RDW: 12.7 % (ref 11.5–15.5)
WBC Count: 5.3 10*3/uL (ref 4.0–10.5)
nRBC: 0 % (ref 0.0–0.2)

## 2022-12-12 LAB — FERRITIN: Ferritin: 27 ng/mL (ref 24–336)

## 2022-12-12 MED ORDER — HEPARIN SOD (PORK) LOCK FLUSH 100 UNIT/ML IV SOLN
500.0000 [IU] | Freq: Once | INTRAVENOUS | Status: AC
  Administered 2022-12-12: 500 [IU]

## 2022-12-12 MED ORDER — SODIUM CHLORIDE 0.9% FLUSH
10.0000 mL | Freq: Once | INTRAVENOUS | Status: AC
  Administered 2022-12-12: 10 mL

## 2022-12-13 DIAGNOSIS — H524 Presbyopia: Secondary | ICD-10-CM | POA: Diagnosis not present

## 2022-12-13 DIAGNOSIS — H52223 Regular astigmatism, bilateral: Secondary | ICD-10-CM | POA: Diagnosis not present

## 2022-12-13 DIAGNOSIS — H5213 Myopia, bilateral: Secondary | ICD-10-CM | POA: Diagnosis not present

## 2022-12-14 ENCOUNTER — Telehealth: Payer: Self-pay

## 2022-12-14 NOTE — Telephone Encounter (Addendum)
Called patient to relay message below as per Dr. Mosetta Putt. Patients wife voiced full understanding.   ----- Message from Malachy Mood sent at 12/14/2022 10:29 AM EDT ----- Please let pt know his recent lab looks good, no concerns.   Malachy Mood

## 2022-12-17 ENCOUNTER — Encounter (HOSPITAL_COMMUNITY): Payer: Medicare HMO

## 2022-12-17 ENCOUNTER — Encounter (HOSPITAL_COMMUNITY)
Admission: RE | Admit: 2022-12-17 | Discharge: 2022-12-17 | Disposition: A | Discharge status: Home / Self Care | Payer: Medicare HMO | Source: Ambulatory Visit | Attending: Cardiology | Admitting: Cardiology

## 2022-12-17 DIAGNOSIS — I5022 Chronic systolic (congestive) heart failure: Secondary | ICD-10-CM | POA: Insufficient documentation

## 2022-12-17 LAB — GLUCOSE, CAPILLARY
Glucose-Capillary: 75 mg/dL (ref 70–99)
Glucose-Capillary: 89 mg/dL (ref 70–99)

## 2022-12-17 NOTE — Progress Notes (Signed)
Incomplete Session Note  Patient Details  Name: Jon Williams MRN: [id number] Date of Birth: 02-14-1950 Referring Provider:   Flowsheet Row INTENSIVE CARDIAC REHAB ORIENT from 12/10/2022 in [facility]  Referring Provider Marca Ancona, MD       [name] did not complete his rehab session.  CBG 75. Patient asymptomatic. [name] says he ate an omelet and drank some water at 0800. Patient was given orange juice. Left message with Dr Lynnette Caffey medical assistant regarding CBG. Recheck CBG 89. [name] does not check his CBG's on a regular basis. Asked [name] to add a heart healthy carbohydrate such as a piece of what toast. [name] plans to return to exercise on Wednesday.Thayer Headings RN BSN

## 2022-12-19 ENCOUNTER — Encounter (HOSPITAL_COMMUNITY): Payer: Medicare HMO

## 2022-12-19 ENCOUNTER — Other Ambulatory Visit (HOSPITAL_COMMUNITY): Payer: Self-pay

## 2022-12-19 ENCOUNTER — Encounter (HOSPITAL_COMMUNITY)
Admission: RE | Admit: 2022-12-19 | Discharge: 2022-12-19 | Disposition: A | Discharge status: Home / Self Care | Payer: Medicare HMO | Source: Ambulatory Visit | Attending: Cardiology | Admitting: Cardiology

## 2022-12-19 DIAGNOSIS — B353 Tinea pedis: Secondary | ICD-10-CM | POA: Diagnosis not present

## 2022-12-19 DIAGNOSIS — L97512 Non-pressure chronic ulcer of other part of right foot with fat layer exposed: Secondary | ICD-10-CM | POA: Diagnosis not present

## 2022-12-19 DIAGNOSIS — M2042 Other hammer toe(s) (acquired), left foot: Secondary | ICD-10-CM | POA: Diagnosis not present

## 2022-12-19 DIAGNOSIS — M2041 Other hammer toe(s) (acquired), right foot: Secondary | ICD-10-CM | POA: Diagnosis not present

## 2022-12-19 DIAGNOSIS — E1142 Type 2 diabetes mellitus with diabetic polyneuropathy: Secondary | ICD-10-CM | POA: Diagnosis not present

## 2022-12-19 DIAGNOSIS — M792 Neuralgia and neuritis, unspecified: Secondary | ICD-10-CM | POA: Diagnosis not present

## 2022-12-19 DIAGNOSIS — L851 Acquired keratosis [keratoderma] palmaris et plantaris: Secondary | ICD-10-CM | POA: Diagnosis not present

## 2022-12-19 DIAGNOSIS — I739 Peripheral vascular disease, unspecified: Secondary | ICD-10-CM | POA: Diagnosis not present

## 2022-12-19 DIAGNOSIS — I5022 Chronic systolic (congestive) heart failure: Secondary | ICD-10-CM

## 2022-12-19 DIAGNOSIS — B351 Tinea unguium: Secondary | ICD-10-CM | POA: Diagnosis not present

## 2022-12-19 LAB — GLUCOSE, CAPILLARY
Glucose-Capillary: 110 mg/dL — ABNORMAL HIGH (ref 70–99)
Glucose-Capillary: 93 mg/dL (ref 70–99)

## 2022-12-19 NOTE — Progress Notes (Signed)
Daily Session Note  Patient Details  Name: KEMOND AMORIN MRN: [id number] Date of Birth: [birth year] Referring Provider:   Flowsheet Row INTENSIVE CARDIAC REHAB ORIENT from 12/10/2022 in [facility]  Referring Provider Marca Ancona, MD       Encounter Date: 12/19/2022  Check In:  Session Check In - 12/19/22 1314       Check-In   Supervising physician immediately available to respond to emergencies [facility] MD immediately available    Physician(s) Neila Gear, NP    Location [facility]    Staff Present Lorin Picket, MS, ACSM-CEP, CCRP, Exercise Physiologist;Olinty Peggye Pitt, MS, ACSM-CEP, Exercise Physiologist;Jennfier Abdulla Gerre Scull, RN, BSN;Johnny Hale Bogus, MS, Exercise Physiologist;Jetta Walker BS, ACSM-CEP, Exercise Physiologist    Virtual Visit No    Medication changes reported     No    Fall or balance concerns reported    No    Tobacco Cessation No Change    Warm-up and Cool-down Performed as group-led instruction    Resistance Training Performed No    VAD Patient? No    PAD/SET Patient? No      Pain Assessment   Currently in Pain? No/denies    Pain Score 0-No pain    Multiple Pain Sites No             Capillary Blood Glucose: Results for orders placed or performed during the hospital encounter of 12/19/22 (from the past 24 hour(s))  Glucose, capillary     Status: Abnormal   Collection Time: 12/19/22  1:28 PM  Result Value Ref Range   Glucose-Capillary 110 (H) 70 - 99 mg/dL      Social History   Tobacco Use  Smoking Status Never  Smokeless Tobacco Never    Goals Met:  Exercise tolerated well No voiced concerns today  Goals Unmet:  Hypotension at exit but asymptomatic, blood sugars stable  Comments: Elisa started cardiac rehab today. Pt tolerated light exercise without difficulty. VSS, telemetry - paced, frequent PVCs, Afib. Roque was noted to be slightly hypotension after exercise  but was asymptomatic. Hydration was encouraged and pt drank 16oz H20 while here. Medication list reconciled, pt reported no changes to med list from orientation. Pt denies barriers to medication compliance.  PSYCHOSOCIAL ASSESSMENT:  PHQ-2/9 scores 0/0. Pt exhibits positive coping skills, hopeful outlook with supportive family. No psychosocial needs identified at this time, no psychosocial interventions necessary. Pt enjoys watching movies and spending time with family. He also enjoys working with children playing sports. Pt oriented to exercise equipment and routine. Understanding verbalized.    Dr. Armanda Magic is Medical Director for Cardiac Rehab at [facility]

## 2022-12-21 ENCOUNTER — Encounter (HOSPITAL_COMMUNITY)
Admission: RE | Admit: 2022-12-21 | Discharge: 2022-12-21 | Disposition: A | Discharge status: Home / Self Care | Payer: Medicare HMO | Source: Ambulatory Visit | Attending: Cardiology

## 2022-12-21 ENCOUNTER — Encounter (HOSPITAL_COMMUNITY): Payer: Medicare HMO

## 2022-12-21 DIAGNOSIS — I5022 Chronic systolic (congestive) heart failure: Secondary | ICD-10-CM | POA: Diagnosis not present

## 2022-12-21 LAB — GLUCOSE, CAPILLARY
Glucose-Capillary: 101 mg/dL — ABNORMAL HIGH (ref 70–99)
Glucose-Capillary: 94 mg/dL (ref 70–99)

## 2022-12-24 ENCOUNTER — Encounter (HOSPITAL_COMMUNITY)
Admission: RE | Admit: 2022-12-24 | Discharge: 2022-12-24 | Disposition: A | Discharge status: Home / Self Care | Payer: Medicare HMO | Source: Ambulatory Visit | Attending: Cardiology

## 2022-12-24 ENCOUNTER — Encounter (HOSPITAL_COMMUNITY): Payer: Medicare HMO

## 2022-12-24 DIAGNOSIS — I5022 Chronic systolic (congestive) heart failure: Secondary | ICD-10-CM | POA: Diagnosis not present

## 2022-12-25 ENCOUNTER — Other Ambulatory Visit: Payer: Self-pay

## 2022-12-26 ENCOUNTER — Encounter (HOSPITAL_COMMUNITY)
Admission: RE | Admit: 2022-12-26 | Discharge: 2022-12-26 | Disposition: A | Discharge status: Home / Self Care | Payer: Medicare HMO | Source: Ambulatory Visit | Attending: Cardiology | Admitting: Cardiology

## 2022-12-26 ENCOUNTER — Encounter (HOSPITAL_COMMUNITY): Payer: Medicare HMO

## 2022-12-26 DIAGNOSIS — I5022 Chronic systolic (congestive) heart failure: Secondary | ICD-10-CM

## 2022-12-28 ENCOUNTER — Encounter (HOSPITAL_COMMUNITY): Payer: Medicare HMO

## 2022-12-28 ENCOUNTER — Encounter (HOSPITAL_COMMUNITY)
Admission: RE | Admit: 2022-12-28 | Discharge: 2022-12-28 | Disposition: A | Discharge status: Home / Self Care | Payer: Medicare HMO | Source: Ambulatory Visit | Attending: Cardiology

## 2022-12-28 DIAGNOSIS — I5022 Chronic systolic (congestive) heart failure: Secondary | ICD-10-CM | POA: Diagnosis not present

## 2022-12-31 ENCOUNTER — Encounter (HOSPITAL_COMMUNITY): Payer: Medicare HMO

## 2022-12-31 ENCOUNTER — Encounter (HOSPITAL_COMMUNITY)
Admission: RE | Admit: 2022-12-31 | Discharge: 2022-12-31 | Disposition: A | Discharge status: Home / Self Care | Payer: Medicare HMO | Source: Ambulatory Visit | Attending: Cardiology | Admitting: Cardiology

## 2022-12-31 DIAGNOSIS — I5022 Chronic systolic (congestive) heart failure: Secondary | ICD-10-CM | POA: Diagnosis not present

## 2023-01-01 NOTE — Progress Notes (Signed)
Cardiac Individual Treatment Plan  Patient Details  Name: Jon Williams MRN: [id number] Date of Birth: November 08, 1950 Referring Provider:   Flowsheet Row INTENSIVE CARDIAC REHAB ORIENT from 12/10/2022 in [facility]  Referring Provider Marca Ancona, MD       Initial Encounter Date:  Flowsheet Row INTENSIVE CARDIAC REHAB ORIENT from 12/10/2022 in [facility]  Date 12/10/22       Visit Diagnosis: Heart failure, chronic systolic (HCC)  Patient's Home Medications on Admission:  Current Outpatient Medications:    atorvastatin (LIPITOR) 40 MG tablet, Take 1 tablet (40 mg total) by mouth daily., Disp: 90 tablet, Rfl: 1   Biotin 1000 MCG tablet, Take 1,000 mcg by mouth daily with breakfast., Disp: , Rfl:    cholecalciferol (VITAMIN D3) 25 MCG (1000 UT) tablet, Take 1,000 Units by mouth daily with breakfast., Disp: , Rfl:    digoxin (LANOXIN) 0.125 MG tablet, Take 0.5 tablets (0.0625 mg total) by mouth daily., Disp: 15 tablet, Rfl: 3   empagliflozin (JARDIANCE) 10 MG TABS tablet, Take 1 tablet (10 mg total) by mouth daily before breakfast., Disp: 30 tablet, Rfl: 11   empagliflozin (JARDIANCE) 10 MG TABS tablet, Take 1 tablet (10 mg total) by mouth daily., Disp: 90 tablet, Rfl: 3   furosemide (LASIX) 40 MG tablet, Take 1 tablet (40 mg total) by mouth daily., Disp: 30 tablet, Rfl: 6   gabapentin (NEURONTIN) 100 MG capsule, Take 1 capsule (100 mg total) by mouth 2 (two) times daily., Disp: 180 capsule, Rfl: 1   ketoconazole (NIZORAL) 2 % cream, Apply 1 Application topically 2 (two) times daily., Disp: 30 g, Rfl: 3   loratadine (CLARITIN) 10 MG tablet, Take 10 mg by mouth daily., Disp: , Rfl:    metFORMIN (GLUCOPHAGE-XR) 500 MG 24 hr tablet, Take 500 mg by mouth every evening., Disp: , Rfl:    metoprolol succinate (TOPROL-XL) 25 MG 24 hr tablet, Take 1 tablet (25 mg total) by  mouth daily., Disp: 90 tablet, Rfl: 1   multivitamin (ONE-A-DAY MEN'S) TABS tablet, Take 1 tablet by mouth daily with breakfast., Disp: , Rfl:    Naftifine HCl (NAFTIN) 2 % GEL, Apply 1 application  topically every Monday, Wednesday, and Friday., Disp: , Rfl:    pantoprazole (PROTONIX) 40 MG tablet, Take 1 tablet (40 mg total) by mouth 2 (two) times daily., Disp: 60 tablet, Rfl: 2   potassium chloride (KLOR-CON) 10 MEQ tablet, Take 2 tablets (20 mEq total) by mouth daily., Disp: 60 tablet, Rfl: 3   sacubitril-valsartan (ENTRESTO) 24-26 MG, Take 1 tablet by mouth 2 (two) times daily., Disp: 180 tablet, Rfl: 1   spironolactone (ALDACTONE) 25 MG tablet, Take 1 tablet (25 mg total) by mouth daily., Disp: 90 tablet, Rfl: 1   Tavaborole 5 % SOLN, Apply 1 application  topically daily. Nail on left hand, Disp: , Rfl:    XARELTO 20 MG TABS tablet, TAKE 1 TABLET BY MOUTH EVERY DAY WITH DINNER (Patient taking differently: Take 20 mg by mouth daily.), Disp: 90 tablet, Rfl: 1  Past Medical History: Past Medical History:  Diagnosis Date   AICD (automatic cardioverter/defibrillator) present 05/25/2016   biv icd   Bunion, right    Chronic combined systolic and diastolic CHF (congestive heart failure) (HCC)    Echo 1/18: Mild conc LVH, EF 15-20, severe diff HK, inf and inf-septal AK, Gr 3 DD, mild to mod MR,  severe LAE, mod reduced RVSF, mod RAE, mild TR, PASP 50   Coronary artery disease involving native coronary artery without angina pectoris 04/17/2016   LHC 1/18: pLCx 30, mLCx 20, mRCA 40, dRCA 20, LVEDP 23, mean RA 8, PA 42/20, PCWP 17   Diabetes mellitus without complication (HCC)    Gastric cancer (HCC)    History of atrial fibrillation    History of atrial flutter    History of cardiomegaly 06/07/2016   Noted on CXR   History of colon polyps 06/28/2017   Noted on colonoscopy   Hypertension    LBBB (left bundle branch block)    Nausea vomiting and diarrhea 04/04/2021   NICM (nonischemic  cardiomyopathy) (HCC)    Echo 1/18:  Mild conc LVH, EF 15-20, severe diff HK, inf and inf-septal AK, Gr 3 DD, mild to mod MR, severe LAE, mod reduced RVSF, mod RAE, mild TR, PASP 50   Other secondary pulmonary hypertension (HCC) 04/17/2016   Prostate cancer (HCC) 2019   Sigmoid diverticulosis 06/28/2017   Noted on colonoscopy    Tobacco Use: Social History   Tobacco Use  Smoking Status Never  Smokeless Tobacco Never    Labs: Review Flowsheet  More data exists      Latest Ref Rng & Units 06/06/2016 12/01/2020 04/01/2021 12/26/2021 10/30/2022  Labs for ITP Cardiac and Pulmonary Rehab  Cholestrol 100 - 199 mg/dL 161  - - - -  LDL (calc) 0 - 99 mg/dL 61  - - - -  HDL-C >09 mg/dL 60  - - - -  Trlycerides 0 - 149 mg/dL 53  - - - -  Hemoglobin A1c 4.8 - 5.6 % - 6.2  6.3  6.1  5.7   Bicarbonate 20.0 - 28.0 mmol/L 20.0 - 28.0 mmol/L - - 24.5  - 22.2  21.3   TCO2 22 - 32 mmol/L 22 - 32 mmol/L - - - - 23  22   Acid-base deficit 0.0 - 2.0 mmol/L 0.0 - 2.0 mmol/L - - - - 2.0  3.0   O2 Saturation % % - - 73.3  58.1  - 65  60     Details       Multiple values from one day are sorted in reverse-chronological order         Capillary Blood Glucose: Lab Results  Component Value Date   GLUCAP 94 12/21/2022   GLUCAP 101 (H) 12/21/2022   GLUCAP 110 (H) 12/19/2022   GLUCAP 93 12/19/2022   GLUCAP 89 12/17/2022     Exercise Target Goals: Exercise Program Goal: Individual exercise prescription set using results from initial 6 min walk test and THRR while considering  patient's activity barriers and safety.   Exercise Prescription Goal: Initial exercise prescription builds to 30-45 minutes a day of aerobic activity, 2-3 days per week.  Home exercise guidelines will be given to patient during program as part of exercise prescription that the participant will acknowledge.  Activity Barriers & Risk Stratification:   6 Minute Walk:  6 Minute Walk     Row Name 12/10/22 1522          6 Minute Walk   Phase Initial     Distance 1150 feet  used go-cart     Walk Time 6 minutes     # of Rest Breaks 0     MPH 2.18     METS 2.43     RPE 9     Perceived Dyspnea  0  VO2 Peak 8.49     Symptoms Yes (comment)     Comments no pain, legs fatigued     Resting HR 64 bpm     Resting BP 108/70     Resting Oxygen Saturation  98 %     Exercise Oxygen Saturation  during 6 min walk 99 %     Max Ex. HR 109 bpm     Max Ex. BP 134/70     2 Minute Post BP 113/86              Oxygen Initial Assessment:   Oxygen Re-Evaluation:   Oxygen Discharge (Final Oxygen Re-Evaluation):   Initial Exercise Prescription:  Initial Exercise Prescription - 12/10/22 1500       Date of Initial Exercise RX and Referring Provider   Date 12/10/22    Referring Provider Marca Ancona, MD    Expected Discharge Date 03/06/23      NuStep   Level 1    SPM 70    Minutes 15    METs 2      Prescription Details   Frequency (times per week) 3    Duration Progress to 30 minutes of continuous aerobic without signs/symptoms of physical distress      Intensity   THRR 40-80% of Max Heartrate 58-117    Ratings of Perceived Exertion 11-13    Perceived Dyspnea 0-4      Progression   Progression Continue progressive overload as per policy without signs/symptoms or physical distress.      Resistance Training   Training Prescription Yes    Weight 2    Reps 10-15             Perform Capillary Blood Glucose checks as needed.  Exercise Prescription Changes:   Exercise Comments:   Exercise Goals and Review:   Exercise Goals     Row Name 12/10/22 1328             Exercise Goals   Increase Physical Activity Yes       Intervention Provide advice, education, support and counseling about physical activity/exercise needs.;Develop an individualized exercise prescription for aerobic and resistive training based on initial evaluation findings, risk stratification, comorbidities and  participant's personal goals.       Expected Outcomes Short Term: Attend rehab on a regular basis to increase amount of physical activity.;Long Term: Exercising regularly at least 3-5 days a week.;Long Term: Add in home exercise to make exercise part of routine and to increase amount of physical activity.       Increase Strength and Stamina Yes       Intervention Provide advice, education, support and counseling about physical activity/exercise needs.;Develop an individualized exercise prescription for aerobic and resistive training based on initial evaluation findings, risk stratification, comorbidities and participant's personal goals.       Expected Outcomes Short Term: Increase workloads from initial exercise prescription for resistance, speed, and METs.;Short Term: Perform resistance training exercises routinely during rehab and add in resistance training at home;Long Term: Improve cardiorespiratory fitness, muscular endurance and strength as measured by increased METs and functional capacity ( )       Able to understand and use rate of perceived exertion (RPE) scale Yes       Intervention Provide education and explanation on how to use RPE scale       Expected Outcomes Short Term: Able to use RPE daily in rehab to express subjective intensity level;Long Term:  Able to use RPE to  guide intensity level when exercising independently       Knowledge and understanding of Target Heart Rate Range (THRR) Yes       Intervention Provide education and explanation of THRR including how the numbers were predicted and where they are located for reference       Expected Outcomes Short Term: Able to state/look up THRR;Short Term: Able to use daily as guideline for intensity in rehab;Long Term: Able to use THRR to govern intensity when exercising independently       Understanding of Exercise Prescription Yes       Intervention Provide education, explanation, and written materials on patient's individual exercise  prescription       Expected Outcomes Short Term: Able to explain program exercise prescription;Long Term: Able to explain home exercise prescription to exercise independently                Exercise Goals Re-Evaluation :   Discharge Exercise Prescription (Final Exercise Prescription Changes):   Nutrition:  Target Goals: Understanding of nutrition guidelines, daily intake of sodium 1500mg , cholesterol 200mg , calories 30% from fat and 7% or less from saturated fats, daily to have 5 or more servings of fruits and vegetables.  Biometrics:  Pre Biometrics - 12/10/22 1356       Pre Biometrics   Waist Circumference 40.5 inches    Hip Circumference 40 inches    Waist to Hip Ratio 1.01 %    Triceps Skinfold 20 mm    % Body Fat 29.9 %    Grip Strength 24 kg    Flexibility 14 in    Single Leg Stand 0 seconds   did not attempt, pt has poor balance             Nutrition Therapy Plan and Nutrition Goals:  Nutrition Therapy & Goals - 12/18/22 1123       Nutrition Therapy   Diet Heart Healthy Diet    Drug/Food Interactions Statins/Certain Fruits      Personal Nutrition Goals   Nutrition Goal Patient to identify strategies for reducing cardiovascular risk by attending the Pritikin education and nutrition series weekly.    Personal Goal #2 Patient to improve diet quality by using the plate method as a guide for meal planning to include lean protein/plant protein, fruits, vegetables, whole grains, nonfat dairy as part of a well-balanced diet    Personal Goal #3 Patient to limit sodium intake to 1500mg  per day    Comments Patient has medical history of CHF, DM2, ICD in place, history of gastric cancer. He continues regular follow-up with oncology. His A1c is well controlled in a pre-diabetic range. Patient will benefit from participation in intensive cardiac rehab for nutrition, exercise, and lifestyle modification.      Intervention Plan   Intervention Prescribe, educate  and counsel regarding individualized specific dietary modifications aiming towards targeted core components such as weight, hypertension, lipid management, diabetes, heart failure and other comorbidities.;Nutrition handout(s) given to patient.    Expected Outcomes Short Term Goal: Understand basic principles of dietary content, such as calories, fat, sodium, cholesterol and nutrients.;Long Term Goal: Adherence to prescribed nutrition plan.             Nutrition Assessments:  MEDIFICTS Score Key: >=70 Need to make dietary changes  40-70 Heart Healthy Diet <= 40 Therapeutic Level Cholesterol Diet    Picture Your Plate Scores: <14 Unhealthy dietary pattern with much room for improvement. 41-50 Dietary pattern unlikely to meet recommendations for good health  and room for improvement. 51-60 More healthful dietary pattern, with some room for improvement.  >60 Healthy dietary pattern, although there may be some specific behaviors that could be improved.    Nutrition Goals Re-Evaluation:  Nutrition Goals Re-Evaluation     Row Name 12/18/22 1123             Goals   Current Weight 180 lb 1.9 oz (81.7 kg)       Comment Lpa 189, A1c 5.7, lipids WNL, LDL ,70 (63)       Expected Outcome Patient has medical history of CHF, DM2, ICD in place, history of gastric cancer. He continues regular follow-up with oncology. His A1c is well controlled in a pre-diabetic range. Patient will benefit from participation in intensive cardiac rehab for nutrition, exercise, and lifestyle modification.                Nutrition Goals Re-Evaluation:  Nutrition Goals Re-Evaluation     Row Name 12/18/22 1123             Goals   Current Weight 180 lb 1.9 oz (81.7 kg)       Comment Lpa 189, A1c 5.7, lipids WNL, LDL ,70 (63)       Expected Outcome Patient has medical history of CHF, DM2, ICD in place, history of gastric cancer. He continues regular follow-up with oncology. His A1c is well controlled in  a pre-diabetic range. Patient will benefit from participation in intensive cardiac rehab for nutrition, exercise, and lifestyle modification.                Nutrition Goals Discharge (Final Nutrition Goals Re-Evaluation):  Nutrition Goals Re-Evaluation - 12/18/22 1123       Goals   Current Weight 180 lb 1.9 oz (81.7 kg)    Comment Lpa 189, A1c 5.7, lipids WNL, LDL ,70 (63)    Expected Outcome Patient has medical history of CHF, DM2, ICD in place, history of gastric cancer. He continues regular follow-up with oncology. His A1c is well controlled in a pre-diabetic range. Patient will benefit from participation in intensive cardiac rehab for nutrition, exercise, and lifestyle modification.             Psychosocial: Target Goals: Acknowledge presence or absence of significant depression and/or stress, maximize coping skills, provide positive support system. Participant is able to verbalize types and ability to use techniques and skills needed for reducing stress and depression.  Initial Review & Psychosocial Screening:  Initial Psych Review & Screening - 12/10/22 1411       Initial Review   Current issues with None Identified      Family Dynamics   Good Support System? Yes   Jon Williams has his wife for support     Barriers   Psychosocial barriers to participate in program There are no identifiable barriers or psychosocial needs.      Screening Interventions   Interventions Encouraged to exercise;Provide feedback about the scores to participant    Expected Outcomes Short Term goal: Identification and review with participant of any Quality of Life or Depression concerns found by scoring the questionnaire.;Long Term goal: The participant improves quality of Life and PHQ9 Scores as seen by post scores and/or verbalization of changes             Quality of Life Scores:  Quality of Life - 12/10/22 1431       Quality of Life   Select Quality of Life      Quality  of Life  Scores   Health/Function Pre 26.23 %    Socioeconomic Pre 29 %    Psych/Spiritual Pre 24.86 %    Family Pre 30 %    GLOBAL Pre 27.02 %            Scores of 19 and below usually indicate a poorer quality of life in these areas.  A difference of  2-3 points is a clinically meaningful difference.  A difference of 2-3 points in the total score of the Quality of Life Index has been associated with significant improvement in overall quality of life, self-image, physical symptoms, and general health in studies assessing change in quality of life.  PHQ-9: Review Flowsheet       12/10/2022 12/03/2019 04/02/2017  Depression screen PHQ 2/9  Decreased Interest 0 0   Down, Depressed, Hopeless 0 0   PHQ - 2 Score 0 0   Altered sleeping 0 - -  Tired, decreased energy 0 - -  Change in appetite 0 - -  Feeling bad or failure about yourself  0 - -  Trouble concentrating 0 - -  Moving slowly or fidgety/restless 0 - -  Suicidal thoughts 0 - -  PHQ-9 Score 0 - -    Details       Information is confidential and restricted. Go to Review Flowsheets to unlock data.        Interpretation of Total Score  Total Score Depression Severity:  1-4 = Minimal depression, 5-9 = Mild depression, 10-14 = Moderate depression, 15-19 = Moderately severe depression, 20-27 = Severe depression   Psychosocial Evaluation and Intervention:   Psychosocial Re-Evaluation:  Psychosocial Re-Evaluation     Row Name 12/20/22 0855             Psychosocial Re-Evaluation   Current issues with None Identified       Interventions Encouraged to attend Cardiac Rehabilitation for the exercise       Continue Psychosocial Services  No Follow up required                Psychosocial Discharge (Final Psychosocial Re-Evaluation):  Psychosocial Re-Evaluation - 12/20/22 0855       Psychosocial Re-Evaluation   Current issues with None Identified    Interventions Encouraged to attend Cardiac Rehabilitation for the  exercise    Continue Psychosocial Services  No Follow up required             Vocational Rehabilitation: Provide vocational rehab assistance to qualifying candidates.   Vocational Rehab Evaluation & Intervention:  Vocational Rehab - 12/10/22 1408       Initial Vocational Rehab Evaluation & Intervention   Assessment shows need for Vocational Rehabilitation No   Jon Williams            Education: Education Goals: Education classes will be provided on a weekly basis, covering required topics. Participant will state understanding/return demonstration of topics presented.    Education     Row Name 12/19/22 1500     Education   Cardiac Education Topics Pritikin   Orthoptist   Educator Dietitian   Weekly Topic Powerhouse Plant-Based Proteins   Instruction Review Code 1- Verbalizes Understanding   Class Start Time 1400   Class Stop Time 1438   Class Time Calculation (min) 38 min    Row Name 12/21/22 1400     Education   Cardiac Education Topics Pritikin   Psychologist, sport and exercise  Core Special educational needs teacher Education   General Education Hypertension and Heart Disease   Instruction Review Code 1- Verbalizes Understanding   Class Start Time 1357   Class Stop Time 1430   Class Time Calculation (min) 33 min    Row Name 12/24/22 1600     Education   Cardiac Education Topics Pritikin   Geographical information systems officer Psychosocial   Psychosocial Workshop From Head to Heart: The Power of a Healthy Outlook   Instruction Review Code 1- Verbalizes Understanding   Class Start Time 1400   Class Stop Time 1450   Class Time Calculation (min) 50 min    Row Name 12/28/22 1500     Education   Cardiac Education Topics Pritikin   Hospital doctor Education   General Education Heart Disease  Risk Reduction   Instruction Review Code 1- Verbalizes Understanding   Class Start Time 1400   Class Stop Time 1433   Class Time Calculation (min) 33 min            Core Videos: Exercise    Move It!  Clinical staff conducted group or individual video education with verbal and written material and guidebook.  Patient learns the recommended Pritikin exercise program. Exercise with the goal of living a long, healthy life. Some of the health benefits of exercise include controlled diabetes, healthier blood pressure levels, improved cholesterol levels, improved heart and lung capacity, improved sleep, and better body composition. Everyone should speak with their doctor before starting or changing an exercise routine.  Biomechanical Limitations Clinical staff conducted group or individual video education with verbal and written material and guidebook.  Patient learns how biomechanical limitations can impact exercise and how we can mitigate and possibly overcome limitations to have an impactful and balanced exercise routine.  Body Composition Clinical staff conducted group or individual video education with verbal and written material and guidebook.  Patient learns that body composition (ratio of muscle mass to fat mass) is a key component to assessing overall fitness, rather than body weight alone. Increased fat mass, especially visceral belly fat, can put [place] at increased risk for metabolic syndrome, type 2 diabetes, heart disease, and even death. It is recommended to combine diet and exercise (cardiovascular and resistance training) to improve your body composition. Seek guidance from your physician and exercise physiologist before implementing an exercise routine.  Exercise Action Plan Clinical staff conducted group or individual video education with verbal and written material and guidebook.  Patient learns the recommended strategies to achieve and enjoy long-term exercise adherence,  including variety, self-motivation, self-efficacy, and positive decision making. Benefits of exercise include fitness, good health, weight management, more energy, better sleep, less stress, and overall well-being.  Medical   Heart Disease Risk Reduction Clinical staff conducted group or individual video education with verbal and written material and guidebook.  Patient learns our heart is our most vital organ as it circulates oxygen, nutrients, white blood cells, and hormones throughout the entire body, and carries waste away. Data supports a plant-based eating plan like the Pritikin Program for its effectiveness in slowing progression of and reversing heart disease. The video provides a number of recommendations to address heart disease.   Metabolic Syndrome and Belly Fat  Clinical staff conducted group or individual video education with verbal and written  material and guidebook.  Patient learns what metabolic syndrome is, how it leads to heart disease, and how one can reverse it and keep it from coming back. You have metabolic syndrome if you have 3 of the following 5 criteria: abdominal obesity, high blood pressure, high triglycerides, low HDL cholesterol, and high blood sugar.  Hypertension and Heart Disease Clinical staff conducted group or individual video education with verbal and written material and guidebook.  Patient learns that high blood pressure, or hypertension, is very common in the [place]. Hypertension is largely due to excessive salt intake, but other important risk factors include being overweight, physical inactivity, drinking too much alcohol, smoking, and not eating enough potassium from fruits and vegetables. High blood pressure is a leading risk factor for heart attack, stroke, congestive heart failure, dementia, kidney failure, and premature death. Long-term effects of excessive salt intake include stiffening of the arteries and thickening of heart muscle and organ  damage. Recommendations include ways to reduce hypertension and the risk of heart disease.  Diseases of Our Time - Focusing on Diabetes Clinical staff conducted group or individual video education with verbal and written material and guidebook.  Patient learns why the best way to stop diseases of our time is prevention, through food and other lifestyle changes. Medicine (such as prescription pills and surgeries) is often only a Band-Aid on the problem, not a long-term solution. Most common diseases of our time include obesity, type 2 diabetes, hypertension, heart disease, and cancer. The Pritikin Program is recommended and has been proven to help reduce, reverse, and/or prevent the damaging effects of metabolic syndrome.  Nutrition   Overview of the Pritikin Eating Plan  Clinical staff conducted group or individual video education with verbal and written material and guidebook.  Patient learns about the Pritikin Eating Plan for disease risk reduction. The Pritikin Eating Plan emphasizes a wide variety of unrefined, minimally-processed carbohydrates, like fruits, vegetables, whole grains, and legumes. Go, Caution, and Stop food choices are explained. Plant-based and lean animal proteins are emphasized. Rationale provided for low sodium intake for blood pressure control, low added sugars for blood sugar stabilization, and low added fats and oils for coronary artery disease risk reduction and weight management.  Calorie Density  Clinical staff conducted group or individual video education with verbal and written material and guidebook.  Patient learns about calorie density and how it impacts the Pritikin Eating Plan. Knowing the characteristics of the food you choose will help you decide whether those foods will lead to weight gain or weight loss, and whether you want to consume more or less of them. Weight loss is usually a side effect of the Pritikin Eating Plan because of its focus on low calorie-dense  foods.  Label Reading  Clinical staff conducted group or individual video education with verbal and written material and guidebook.  Patient learns about the Pritikin recommended label reading guidelines and corresponding recommendations regarding calorie density, added sugars, sodium content, and whole grains.  Dining Out - Part 1  Clinical staff conducted group or individual video education with verbal and written material and guidebook.  Patient learns that restaurant meals can be sabotaging because they can be so high in calories, fat, sodium, and/or sugar. Patient learns recommended strategies on how to positively address this and avoid unhealthy pitfalls.  Facts on Fats  Clinical staff conducted group or individual video education with verbal and written material and guidebook.  Patient learns that lifestyle modifications can be just as effective, if  not more so, as many medications for lowering your risk of heart disease. A Pritikin lifestyle can help to reduce your risk of inflammation and atherosclerosis (cholesterol build-up, or plaque, in the artery walls). Lifestyle interventions such as dietary choices and physical activity address the cause of atherosclerosis. A review of the types of fats and their impact on blood cholesterol levels, along with dietary recommendations to reduce fat intake is also included.  Nutrition Action Plan  Clinical staff conducted group or individual video education with verbal and written material and guidebook.  Patient learns how to incorporate Pritikin recommendations into their lifestyle. Recommendations include planning and keeping personal health goals in mind as an important part of their success.  Healthy Mind-Set    Healthy Minds, Bodies, Hearts  Clinical staff conducted group or individual video education with verbal and written material and guidebook.  Patient learns how to identify when they are stressed. Video will discuss the impact of that  stress, as well as the many benefits of stress management. Patient will also be introduced to stress management techniques. The way we think, act, and feel has an impact on our hearts.  How Our Thoughts Can Heal Our Hearts  Clinical staff conducted group or individual video education with verbal and written material and guidebook.  Patient learns that negative thoughts can cause depression and anxiety. This can result in negative lifestyle behavior and serious health problems. Cognitive behavioral therapy is an effective method to help control our thoughts in order to change and improve our emotional outlook.  Additional Videos:  Exercise    Improving Performance  Clinical staff conducted group or individual video education with verbal and written material and guidebook.  Patient learns to use a non-linear approach by alternating intensity levels and lengths of time spent exercising to help burn more calories and lose more body fat. Cardiovascular exercise helps improve heart health, metabolism, hormonal balance, blood sugar control, and recovery from fatigue. Resistance training improves strength, endurance, balance, coordination, reaction time, metabolism, and muscle mass. Flexibility exercise improves circulation, posture, and balance. Seek guidance from your physician and exercise physiologist before implementing an exercise routine and learn your capabilities and proper form for all exercise.  Introduction to Yoga  Clinical staff conducted group or individual video education with verbal and written material and guidebook.  Patient learns about yoga, a discipline of the coming together of mind, breath, and body. The benefits of yoga include improved flexibility, improved range of motion, better posture and core strength, increased lung function, weight loss, and positive self-image. Yoga's heart health benefits include lowered blood pressure, healthier heart rate, decreased cholesterol and  triglyceride levels, improved immune function, and reduced stress. Seek guidance from your physician and exercise physiologist before implementing an exercise routine and learn your capabilities and proper form for all exercise.  Medical   Aging: Enhancing Your Quality of Life  Clinical staff conducted group or individual video education with verbal and written material and guidebook.  Patient learns key strategies and recommendations to stay in good physical health and enhance quality of life, such as prevention strategies, having an advocate, securing a Health Care Proxy and Power of Attorney, and keeping a list of medications and system for tracking them. It also discusses how to avoid risk for bone loss.  Biology of Weight Control  Clinical staff conducted group or individual video education with verbal and written material and guidebook.  Patient learns that weight gain occurs because we consume more calories than we burn (  eating more, moving less). Even if your body weight is normal, you may have higher ratios of fat compared to muscle mass. Too much body fat puts you at increased risk for cardiovascular disease, heart attack, stroke, type 2 diabetes, and obesity-related cancers. In addition to exercise, following the Pritikin Eating Plan can help reduce your risk.  Decoding Lab Results  Clinical staff conducted group or individual video education with verbal and written material and guidebook.  Patient learns that lab test reflects one measurement whose values change over time and are influenced by many factors, including medication, stress, sleep, exercise, food, hydration, pre-existing medical conditions, and more. It is recommended to use the knowledge from this video to become more involved with your lab results and evaluate your numbers to speak with your doctor.   Diseases of Our Time - Overview  Clinical staff conducted group or individual video education with verbal and written  material and guidebook.  Patient learns that according to the CDC, 50% to 70% of chronic diseases (such as obesity, type 2 diabetes, elevated lipids, hypertension, and heart disease) are avoidable through lifestyle improvements including healthier food choices, listening to satiety cues, and increased physical activity.  Sleep Disorders Clinical staff conducted group or individual video education with verbal and written material and guidebook.  Patient learns how good quality and duration of sleep are important to overall health and well-being. Patient also learns about sleep disorders and how they impact health along with recommendations to address them, including discussing with a physician.  Nutrition  Dining Out - Part 2 Clinical staff conducted group or individual video education with verbal and written material and guidebook.  Patient learns how to plan ahead and communicate in order to maximize their dining experience in a healthy and nutritious manner. Included are recommended food choices based on the type of restaurant the patient is visiting.   Fueling a Banker conducted group or individual video education with verbal and written material and guidebook.  There is a strong connection between our food choices and our health. Diseases like obesity and type 2 diabetes are very prevalent and are in large-part due to lifestyle choices. The Pritikin Eating Plan provides plenty of food and hunger-curbing satisfaction. It is easy to follow, affordable, and helps reduce health risks.  Menu Workshop  Clinical staff conducted group or individual video education with verbal and written material and guidebook.  Patient learns that restaurant meals can sabotage health goals because they are often packed with calories, fat, sodium, and sugar. Recommendations include strategies to plan ahead and to communicate with the manager, chef, or server to help order a healthier  meal.  Planning Your Eating Strategy  Clinical staff conducted group or individual video education with verbal and written material and guidebook.  Patient learns about the Pritikin Eating Plan and its benefit of reducing the risk of disease. The Pritikin Eating Plan does not focus on calories. Instead, it emphasizes high-quality, nutrient-rich foods. By knowing the characteristics of the foods, we choose, we can determine their calorie density and make informed decisions.  Targeting Your Nutrition Priorities  Clinical staff conducted group or individual video education with verbal and written material and guidebook.  Patient learns that lifestyle habits have a tremendous impact on disease risk and progression. This video provides eating and physical activity recommendations based on your personal health goals, such as reducing LDL cholesterol, losing weight, preventing or controlling type 2 diabetes, and reducing high blood pressure.  Vitamins  and Minerals  Clinical staff conducted group or individual video education with verbal and written material and guidebook.  Patient learns different ways to obtain key vitamins and minerals, including through a recommended healthy diet. It is important to discuss all supplements you take with your doctor.   Healthy Mind-Set    Smoking Cessation  Clinical staff conducted group or individual video education with verbal and written material and guidebook.  Patient learns that cigarette smoking and tobacco addiction pose a serious health risk which affects millions of people. Stopping smoking will significantly reduce the risk of heart disease, lung disease, and many forms of cancer. Recommended strategies for quitting are covered, including working with your doctor to develop a successful plan.  Culinary   Becoming a Set designer conducted group or individual video education with verbal and written material and guidebook.  Patient learns  that cooking at home can be healthy, cost-effective, quick, and puts them in control. Keys to cooking healthy recipes will include looking at your recipe, assessing your equipment needs, planning ahead, making it simple, choosing cost-effective seasonal ingredients, and limiting the use of added fats, salts, and sugars.  Cooking - Breakfast and Snacks  Clinical staff conducted group or individual video education with verbal and written material and guidebook.  Patient learns how important breakfast is to satiety and nutrition through the entire day. Recommendations include key foods to eat during breakfast to help stabilize blood sugar levels and to prevent overeating at meals later in the day. Planning ahead is also a key component.  Cooking - Educational psychologist conducted group or individual video education with verbal and written material and guidebook.  Patient learns eating strategies to improve overall health, including an approach to cook more at home. Recommendations include thinking of animal protein as a side on your plate rather than center stage and focusing instead on lower calorie dense options like vegetables, fruits, whole grains, and plant-based proteins, such as beans. Making sauces in large quantities to freeze for later and leaving the skin on your vegetables are also recommended to maximize your experience.  Cooking - Healthy Salads and Dressing Clinical staff conducted group or individual video education with verbal and written material and guidebook.  Patient learns that vegetables, fruits, whole grains, and legumes are the foundations of the Pritikin Eating Plan. Recommendations include how to incorporate each of these in flavorful and healthy salads, and how to create homemade salad dressings. Proper handling of ingredients is also covered. Cooking - Soups and [employer] - Soups and Desserts Clinical staff conducted group or individual video education with  verbal and written material and guidebook.  Patient learns that Pritikin soups and desserts make for easy, nutritious, and delicious snacks and meal components that are low in sodium, fat, sugar, and calorie density, while high in vitamins, minerals, and filling fiber. Recommendations include simple and healthy ideas for soups and desserts.   Overview     The Pritikin Solution Program Overview Clinical staff conducted group or individual video education with verbal and written material and guidebook.  Patient learns that the results of the Pritikin Program have been documented in more than 100 articles published in peer-reviewed journals, and the benefits include reducing risk factors for (and, in some cases, even reversing) high cholesterol, high blood pressure, type 2 diabetes, obesity, and more! An overview of the three key pillars of the Pritikin Program will be covered: eating well, doing regular exercise, and having  a healthy mind-set.  WORKSHOPS  Exercise: Exercise Basics: Building Your Action Plan Clinical staff led group instruction and group discussion with PowerPoint presentation and patient guidebook. To enhance the learning environment the use of posters, models and videos may be added. At the conclusion of this workshop, patients will comprehend the difference between physical activity and exercise, as well as the benefits of incorporating both, into their routine. Patients will understand the FITT (Frequency, Intensity, Time, and Type) principle and how to use it to build an exercise action plan. In addition, safety concerns and other considerations for exercise and cardiac rehab will be addressed by the presenter. The purpose of this lesson is to promote a comprehensive and effective weekly exercise routine in order to improve patients' overall level of fitness.   Managing Heart Disease: Your Path to a Healthier Heart Clinical staff led group instruction and group discussion with  PowerPoint presentation and patient guidebook. To enhance the learning environment the use of posters, models and videos may be added.At the conclusion of this workshop, patients will understand the anatomy and physiology of the heart. Additionally, they will understand how Pritikin's three pillars impact the risk factors, the progression, and the management of heart disease.  The purpose of this lesson is to provide a high-level overview of the heart, heart disease, and how the Pritikin lifestyle positively impacts risk factors.  Exercise Biomechanics Clinical staff led group instruction and group discussion with PowerPoint presentation and patient guidebook. To enhance the learning environment the use of posters, models and videos may be added. Patients will learn how the structural parts of their bodies function and how these functions impact their daily activities, movement, and exercise. Patients will learn how to promote a neutral spine, learn how to manage pain, and identify ways to improve their physical movement in order to promote healthy living. The purpose of this lesson is to expose patients to common physical limitations that impact physical activity. Participants will learn practical ways to adapt and manage aches and pains, and to minimize their effect on regular exercise. Patients will learn how to maintain good posture while sitting, walking, and lifting.  Balance Training and Fall Prevention  Clinical staff led group instruction and group discussion with PowerPoint presentation and patient guidebook. To enhance the learning environment the use of posters, models and videos may be added. At the conclusion of this workshop, patients will understand the importance of their sensorimotor skills (vision, proprioception, and the vestibular system) in maintaining their ability to balance as they age. Patients will apply a variety of balancing exercises that are appropriate for their  current level of function. Patients will understand the common causes for poor balance, possible solutions to these problems, and ways to modify their physical environment in order to minimize their fall risk. The purpose of this lesson is to teach patients about the importance of maintaining balance as they age and ways to minimize their risk of falling.  WORKSHOPS   Nutrition:  Fueling a Ship broker led group instruction and group discussion with PowerPoint presentation and patient guidebook. To enhance the learning environment the use of posters, models and videos may be added. Patients will review the foundational principles of the Pritikin Eating Plan and understand what constitutes a serving size in each of the food groups. Patients will also learn Pritikin-friendly foods that are better choices when away from home and review make-ahead meal and snack options. Calorie density will be reviewed and applied to three nutrition priorities:  weight maintenance, weight loss, and weight gain. The purpose of this lesson is to reinforce (in a group setting) the key concepts around what patients are recommended to eat and how to apply these guidelines when away from home by planning and selecting Pritikin-friendly options. Patients will understand how calorie density may be adjusted for different weight management goals.  Mindful Eating  Clinical staff led group instruction and group discussion with PowerPoint presentation and patient guidebook. To enhance the learning environment the use of posters, models and videos may be added. Patients will briefly review the concepts of the Pritikin Eating Plan and the importance of low-calorie dense foods. The concept of mindful eating will be introduced as well as the importance of paying attention to internal hunger signals. Triggers for non-hunger eating and techniques for dealing with triggers will be explored. The purpose of this lesson is to provide  patients with the opportunity to review the basic principles of the Pritikin Eating Plan, discuss the value of eating mindfully and how to measure internal cues of hunger and fullness using the Hunger Scale. Patients will also discuss reasons for non-hunger eating and learn strategies to use for controlling emotional eating.  Targeting Your Nutrition Priorities Clinical staff led group instruction and group discussion with PowerPoint presentation and patient guidebook. To enhance the learning environment the use of posters, models and videos may be added. Patients will learn how to determine their genetic susceptibility to disease by reviewing their family history. Patients will gain insight into the importance of diet as part of an overall healthy lifestyle in mitigating the impact of genetics and other environmental insults. The purpose of this lesson is to provide patients with the opportunity to assess their personal nutrition priorities by looking at their family history, their own health history and current risk factors. Patients will also be able to discuss ways of prioritizing and modifying the Pritikin Eating Plan for their highest risk areas  Menu  Clinical staff led group instruction and group discussion with PowerPoint presentation and patient guidebook. To enhance the learning environment the use of posters, models and videos may be added. Using menus brought in from [employer], or printed from [employer], patients will apply the Pritikin dining out guidelines that were presented in the [employer] video. Patients will also be able to practice these guidelines in a variety of provided scenarios. The purpose of this lesson is to provide patients with the opportunity to practice hands-on learning of the Pritikin Dining Out guidelines with actual menus and practice scenarios.  Label Reading Clinical staff led group instruction and group discussion with PowerPoint  presentation and patient guidebook. To enhance the learning environment the use of posters, models and videos may be added. Patients will review and discuss the Pritikin label reading guidelines presented in Pritikin's Label Reading Educational series video. Using fool labels brought in from local grocery stores and markets, patients will apply the label reading guidelines and determine if the packaged food meet the Pritikin guidelines. The purpose of this lesson is to provide patients with the opportunity to review, discuss, and practice hands-on learning of the Pritikin Label Reading guidelines with actual packaged food labels. Cooking School  Pritikin's [employer] are designed to teach patients ways to prepare quick, simple, and affordable recipes at home. The importance of nutrition's role in chronic disease risk reduction is reflected in its emphasis in the overall Pritikin program. By learning how to prepare essential core Pritikin Eating Plan recipes, patients  will increase control over what they eat; be able to customize the flavor of foods without the use of added salt, sugar, or fat; and improve the quality of the food they consume. By learning a set of core recipes which are easily assembled, quickly prepared, and affordable, patients are more likely to prepare more healthy foods at home. These workshops focus on convenient breakfasts, simple entres, side dishes, and desserts which can be prepared with minimal effort and are consistent with nutrition recommendations for cardiovascular risk reduction. Cooking [employer] are taught by a Armed forces logistics/support/administrative officer (RD) who has been trained by the [employer]. The chef or RD has a clear understanding of the importance of minimizing - if not completely eliminating - added fat, sugar, and sodium in recipes. Throughout the series of Cooking School Workshop sessions, patients will learn about healthy ingredients and  efficient methods of cooking to build confidence in their capability to prepare    Cooking School weekly topics:  Adding Flavor- Sodium-Free  Fast and Healthy Breakfasts  Powerhouse Plant-Based Proteins  Satisfying Salads and Dressings  Simple Sides and Sauces  International Cuisine-Spotlight on the [employer] Zones  Delicious Desserts  Savory Soups  [employer] - Meals in a Astronomer Appetizers and Snacks  Comforting Weekend Breakfasts  One-Pot Wonders   Fast Evening Meals  Landscape architect Your Pritikin Plate  WORKSHOPS   Healthy Mindset (Psychosocial):  Focused Goals, Sustainable Changes Clinical staff led group instruction and group discussion with PowerPoint presentation and patient guidebook. To enhance the learning environment the use of posters, models and videos may be added. Patients will be able to apply effective goal setting strategies to establish at least one personal goal, and then take consistent, meaningful action toward that goal. They will learn to identify common barriers to achieving personal goals and develop strategies to overcome them. Patients will also gain an understanding of how our mind-set can impact our ability to achieve goals and the importance of cultivating a positive and growth-oriented mind-set. The purpose of this lesson is to provide patients with a deeper understanding of how to set and achieve personal goals, as well as the tools and strategies needed to overcome common obstacles which may arise along the way.  From Head to Heart: The Power of a Healthy Outlook  Clinical staff led group instruction and group discussion with PowerPoint presentation and patient guidebook. To enhance the learning environment the use of posters, models and videos may be added. Patients will be able to recognize and describe the impact of emotions and mood on physical health. They will discover the importance of self-care and explore self-care  practices which may work for them. Patients will also learn how to utilize the 4 C's to cultivate a healthier outlook and better manage stress and challenges. The purpose of this lesson is to demonstrate to patients how a healthy outlook is an essential part of maintaining good health, especially as they continue their cardiac rehab journey.  [facility] a [facility] Clinical staff led group instruction and group discussion with PowerPoint presentation and patient guidebook. To enhance the learning environment the use of posters, models and videos may be added. At the conclusion of this workshop, patients will be able to demonstrate knowledge of the importance of sleep to overall health, well-being, and quality of life. They will understand the symptoms of, and treatments for, common sleep disorders. Patients will also be able to identify daytime and nighttime  behaviors which impact sleep, and they will be able to apply these tools to help manage sleep-related challenges. The purpose of this lesson is to provide patients with a general overview of sleep and outline the importance of quality sleep. Patients will learn about a few of the most common sleep disorders. Patients will also be introduced to the concept of "sleep hygiene," and discover ways to self-manage certain sleeping problems through simple daily behavior changes. Finally, the workshop will motivate patients by clarifying the links between quality sleep and their goals of heart-healthy living.   Recognizing and Reducing Stress Clinical staff led group instruction and group discussion with PowerPoint presentation and patient guidebook. To enhance the learning environment the use of posters, models and videos may be added. At the conclusion of this workshop, patients will be able to understand the types of stress reactions, differentiate between acute and chronic stress, and recognize the impact that chronic stress has on their health. They  will also be able to apply different coping mechanisms, such as reframing negative self-talk. Patients will have the opportunity to practice a variety of stress management techniques, such as deep abdominal breathing, progressive muscle relaxation, and/or guided imagery.  The purpose of this lesson is to educate patients on the role of stress in their lives and to provide healthy techniques for coping with it.  Learning Barriers/Preferences:  Learning Barriers/Preferences - 12/10/22 1409       Learning Barriers/Preferences   Learning Barriers Sight   reading glasses   Learning Preferences Audio;Computer/Internet;Group Instruction;Pictoral;Individual Instruction;Skilled Demonstration;Verbal Instruction;Video;Written Material             Education Topics:  Knowledge Questionnaire Score:  Knowledge Questionnaire Score - 12/10/22 1407       Knowledge Questionnaire Score   Pre Score 18/24             Core Components/Risk Factors/Patient Goals at Admission:  Personal Goals and Risk Factors at Admission - 12/10/22 1403       Core Components/Risk Factors/Patient Goals on Admission    Weight Management Yes    Intervention Weight Management: Develop a combined nutrition and exercise program designed to reach desired caloric intake, while maintaining appropriate intake of nutrient and fiber, sodium and fats, and appropriate energy expenditure required for the weight goal.;Weight Management: Provide education and appropriate resources to help participant work on and attain dietary goals.    Expected Outcomes Short Term: Continue to assess and modify interventions until short term weight is achieved;Long Term: Adherence to nutrition and physical activity/exercise program aimed toward attainment of established weight goal;Understanding recommendations for meals to include 15-35% energy as protein, 25-35% energy from fat, 35-60% energy from carbohydrates, less than 200mg  of dietary  cholesterol, 20-35 gm of total fiber daily;Understanding of distribution of calorie intake throughout the day with the consumption of 4-5 meals/snacks    Diabetes Yes    Intervention Provide education about signs/symptoms and action to take for hypo/hyperglycemia.;Provide education about proper nutrition, including hydration, and aerobic/resistive exercise prescription along with prescribed medications to achieve blood glucose in normal ranges: Fasting glucose 65-99 mg/dL    Expected Outcomes Short Term: Participant verbalizes understanding of the signs/symptoms and immediate care of hyper/hypoglycemia, proper foot care and importance of medication, aerobic/resistive exercise and nutrition plan for blood glucose control.;Long Term: Attainment of HbA1C < 7%.    Heart Failure Yes    Intervention Provide a combined exercise and nutrition program that is supplemented with education, support and counseling about heart failure. Directed toward relieving  symptoms such as shortness of breath, decreased exercise tolerance, and extremity edema.    Expected Outcomes Improve functional capacity of life;Short term: Attendance in program 2-3 days a week with increased exercise capacity. Reported lower sodium intake. Reported increased fruit and vegetable intake. Reports medication compliance.;Short term: Daily weights obtained and reported for increase. Utilizing diuretic protocols set by physician.;Long term: Adoption of self-care skills and reduction of barriers for early signs and symptoms recognition and intervention leading to self-care maintenance.    Hypertension Yes    Intervention Monitor prescription use compliance.;Provide education on lifestyle modifcations including regular physical activity/exercise, weight management, moderate sodium restriction and increased consumption of fresh fruit, vegetables, and low fat dairy, alcohol moderation, and smoking cessation.    Expected Outcomes Short Term: Continued  assessment and intervention until BP is < 140/32mm HG in hypertensive participants. < 130/79mm HG in hypertensive participants with diabetes, heart failure or chronic kidney disease.;Long Term: Maintenance of blood pressure at goal levels.    Lipids Yes    Intervention Provide education and support for participant on nutrition & aerobic/resistive exercise along with prescribed medications to achieve LDL 70mg , HDL >40mg .    Expected Outcomes Short Term: Participant states understanding of desired cholesterol values and is compliant with medications prescribed. Participant is following exercise prescription and nutrition guidelines.;Long Term: Cholesterol controlled with medications as prescribed, with individualized exercise RX and with personalized nutrition plan. Value goals: LDL < 70mg , HDL > 40 mg.             Core Components/Risk Factors/Patient Goals Review:   Goals and Risk Factor Review     Row Name 12/20/22 0856 01/01/23 1314           Core Components/Risk Factors/Patient Goals Review   Personal Goals Review Weight Management/Obesity;Lipids;Diabetes;Heart Failure;Hypertension Weight Management/Obesity;Lipids;Diabetes;Heart Failure;Hypertension      Review Jon Williams  started cardiac rehab on 12/19/22. Jon Williams did fair with exercise for his fitness level. Jon Williams was hypotensive post exercise. Asymptomatic. Given water. CBG's were WNL. Jon Williams  started cardiac rehab on 12/19/22. Jon Williams is off to a good start to  exercise      Expected Outcomes Jon Williams will continue to participate in cardiac rehab for exercise, nutrition and lifestyle modifications. Jon Williams will continue to participate in cardiac rehab for exercise, nutrition and lifestyle modifications.               Core Components/Risk Factors/Patient Goals at Discharge (Final Review):   Goals and Risk Factor Review - 01/01/23 1314       Core Components/Risk Factors/Patient Goals Review   Personal Goals Review Weight  Management/Obesity;Lipids;Diabetes;Heart Failure;Hypertension    Review Jon Williams  started cardiac rehab on 12/19/22. Jon Williams is off to a good start to  exercise    Expected Outcomes Jon Williams will continue to participate in cardiac rehab for exercise, nutrition and lifestyle modifications.             ITP Comments:  ITP Comments     Row Name 12/10/22 1327 12/20/22 0843 01/01/23 1309       ITP Comments Dr. Armanda Magic medical director. Introduction to pritikin education/ intensvie cardiac rehab. Initial orientation packet reviewed with patient. 30 Day ITP Review. Jon Williams started cardiac rehab on 12/19/22. Jon Williams did fair with exercise for his fitness level as Jon Williams is somewhat deconditioned. 30 Day ITP Review. Jon Williams started cardiac rehab on 12/19/22. Jon Williams is off to a good start to  exercise              Comments:  See ITP comments.Thayer Headings RN BSN

## 2023-01-02 ENCOUNTER — Encounter (HOSPITAL_COMMUNITY)
Admission: RE | Admit: 2023-01-02 | Discharge: 2023-01-02 | Disposition: A | Discharge status: Home / Self Care | Payer: Medicare HMO | Source: Ambulatory Visit | Attending: Cardiology

## 2023-01-02 ENCOUNTER — Encounter (HOSPITAL_COMMUNITY): Payer: Medicare HMO

## 2023-01-02 DIAGNOSIS — E1142 Type 2 diabetes mellitus with diabetic polyneuropathy: Secondary | ICD-10-CM | POA: Diagnosis not present

## 2023-01-02 DIAGNOSIS — L97512 Non-pressure chronic ulcer of other part of right foot with fat layer exposed: Secondary | ICD-10-CM | POA: Diagnosis not present

## 2023-01-02 DIAGNOSIS — M2042 Other hammer toe(s) (acquired), left foot: Secondary | ICD-10-CM | POA: Diagnosis not present

## 2023-01-02 DIAGNOSIS — I5022 Chronic systolic (congestive) heart failure: Secondary | ICD-10-CM

## 2023-01-02 DIAGNOSIS — B353 Tinea pedis: Secondary | ICD-10-CM | POA: Diagnosis not present

## 2023-01-02 DIAGNOSIS — L851 Acquired keratosis [keratoderma] palmaris et plantaris: Secondary | ICD-10-CM | POA: Diagnosis not present

## 2023-01-02 DIAGNOSIS — I739 Peripheral vascular disease, unspecified: Secondary | ICD-10-CM | POA: Diagnosis not present

## 2023-01-02 DIAGNOSIS — B351 Tinea unguium: Secondary | ICD-10-CM | POA: Diagnosis not present

## 2023-01-02 DIAGNOSIS — M792 Neuralgia and neuritis, unspecified: Secondary | ICD-10-CM | POA: Diagnosis not present

## 2023-01-02 DIAGNOSIS — M2041 Other hammer toe(s) (acquired), right foot: Secondary | ICD-10-CM | POA: Diagnosis not present

## 2023-01-03 DIAGNOSIS — Z8546 Personal history of malignant neoplasm of prostate: Secondary | ICD-10-CM | POA: Diagnosis not present

## 2023-01-03 DIAGNOSIS — C61 Malignant neoplasm of prostate: Secondary | ICD-10-CM | POA: Diagnosis not present

## 2023-01-03 DIAGNOSIS — N401 Enlarged prostate with lower urinary tract symptoms: Secondary | ICD-10-CM | POA: Diagnosis not present

## 2023-01-03 DIAGNOSIS — R350 Frequency of micturition: Secondary | ICD-10-CM | POA: Diagnosis not present

## 2023-01-04 ENCOUNTER — Encounter (HOSPITAL_COMMUNITY): Payer: Medicare HMO

## 2023-01-04 ENCOUNTER — Other Ambulatory Visit (HOSPITAL_COMMUNITY): Payer: Self-pay

## 2023-01-04 ENCOUNTER — Encounter (HOSPITAL_COMMUNITY)
Admission: RE | Admit: 2023-01-04 | Discharge: 2023-01-04 | Disposition: A | Discharge status: Home / Self Care | Payer: Medicare HMO | Source: Ambulatory Visit | Attending: Cardiology

## 2023-01-04 DIAGNOSIS — I5022 Chronic systolic (congestive) heart failure: Secondary | ICD-10-CM

## 2023-01-04 MED ORDER — PANTOPRAZOLE SODIUM 40 MG PO TBEC
40.0000 mg | DELAYED_RELEASE_TABLET | Freq: Two times a day (BID) | ORAL | 3 refills | Status: DC
  Filled 2023-01-04 – 2023-01-07 (×2): qty 180, 90d supply, fill #0
  Filled 2023-04-08: qty 180, 90d supply, fill #1
  Filled 2023-07-02: qty 180, 90d supply, fill #2

## 2023-01-07 ENCOUNTER — Encounter (HOSPITAL_COMMUNITY)
Admission: RE | Admit: 2023-01-07 | Discharge: 2023-01-07 | Disposition: A | Discharge status: Home / Self Care | Payer: Medicare HMO | Source: Ambulatory Visit | Attending: Cardiology | Admitting: Cardiology

## 2023-01-07 ENCOUNTER — Other Ambulatory Visit (HOSPITAL_COMMUNITY): Payer: Self-pay

## 2023-01-07 ENCOUNTER — Encounter (HOSPITAL_COMMUNITY): Payer: Medicare HMO

## 2023-01-07 DIAGNOSIS — I5022 Chronic systolic (congestive) heart failure: Secondary | ICD-10-CM | POA: Diagnosis not present

## 2023-01-08 ENCOUNTER — Other Ambulatory Visit (HOSPITAL_COMMUNITY): Payer: Self-pay

## 2023-01-08 ENCOUNTER — Other Ambulatory Visit (HOSPITAL_BASED_OUTPATIENT_CLINIC_OR_DEPARTMENT_OTHER): Payer: Self-pay

## 2023-01-08 DIAGNOSIS — L97512 Non-pressure chronic ulcer of other part of right foot with fat layer exposed: Secondary | ICD-10-CM | POA: Diagnosis not present

## 2023-01-08 DIAGNOSIS — M79672 Pain in left foot: Secondary | ICD-10-CM | POA: Diagnosis not present

## 2023-01-08 DIAGNOSIS — L603 Nail dystrophy: Secondary | ICD-10-CM | POA: Diagnosis not present

## 2023-01-08 DIAGNOSIS — E1151 Type 2 diabetes mellitus with diabetic peripheral angiopathy without gangrene: Secondary | ICD-10-CM | POA: Diagnosis not present

## 2023-01-08 DIAGNOSIS — L84 Corns and callosities: Secondary | ICD-10-CM | POA: Diagnosis not present

## 2023-01-08 DIAGNOSIS — I739 Peripheral vascular disease, unspecified: Secondary | ICD-10-CM | POA: Diagnosis not present

## 2023-01-08 DIAGNOSIS — E1142 Type 2 diabetes mellitus with diabetic polyneuropathy: Secondary | ICD-10-CM | POA: Diagnosis not present

## 2023-01-08 DIAGNOSIS — M79671 Pain in right foot: Secondary | ICD-10-CM | POA: Diagnosis not present

## 2023-01-08 MED ORDER — MUPIROCIN 2 % EX OINT
TOPICAL_OINTMENT | CUTANEOUS | 0 refills | Status: DC
  Filled 2023-01-08 – 2023-01-09 (×2): qty 22, 14d supply, fill #0

## 2023-01-09 ENCOUNTER — Encounter (HOSPITAL_COMMUNITY)
Admission: RE | Admit: 2023-01-09 | Discharge: 2023-01-09 | Disposition: A | Discharge status: Home / Self Care | Payer: Medicare HMO | Source: Ambulatory Visit | Attending: Cardiology

## 2023-01-09 ENCOUNTER — Ambulatory Visit: Payer: Medicare HMO

## 2023-01-09 ENCOUNTER — Encounter (HOSPITAL_COMMUNITY): Payer: Medicare HMO

## 2023-01-09 ENCOUNTER — Other Ambulatory Visit (HOSPITAL_BASED_OUTPATIENT_CLINIC_OR_DEPARTMENT_OTHER): Payer: Self-pay

## 2023-01-09 ENCOUNTER — Other Ambulatory Visit (HOSPITAL_COMMUNITY): Payer: Self-pay

## 2023-01-09 DIAGNOSIS — I428 Other cardiomyopathies: Secondary | ICD-10-CM

## 2023-01-09 DIAGNOSIS — I5022 Chronic systolic (congestive) heart failure: Secondary | ICD-10-CM

## 2023-01-09 LAB — CUP PACEART REMOTE DEVICE CHECK
Battery Remaining Longevity: 26 mo
Battery Remaining Percentage: 28 %
Battery Voltage: 2.84 V
Brady Statistic AP VP Percent: 0 %
Brady Statistic AP VS Percent: 0 %
Brady Statistic AS VP Percent: 100 %
Brady Statistic AS VS Percent: 0 %
Brady Statistic RA Percent Paced: 1 %
Brady Statistic RV Percent Paced: 87 %
Date Time Interrogation Session: 20241127020026
HighPow Impedance: 57 Ohm
HighPow Impedance: 57 Ohm
Implantable Lead Connection Status: 753985
Implantable Lead Connection Status: 753985
Implantable Lead Implant Date: 20180413
Implantable Lead Implant Date: 20200828
Implantable Lead Location: 753859
Implantable Lead Location: 753860
Implantable Lead Model: 7122
Implantable Pulse Generator Implant Date: 20180413
Lead Channel Impedance Value: 380 Ohm
Lead Channel Impedance Value: 800 Ohm
Lead Channel Pacing Threshold Amplitude: 0.75 V
Lead Channel Pacing Threshold Amplitude: 1 V
Lead Channel Pacing Threshold Pulse Width: 0.5 ms
Lead Channel Pacing Threshold Pulse Width: 0.5 ms
Lead Channel Sensing Intrinsic Amplitude: 2.3 mV
Lead Channel Sensing Intrinsic Amplitude: 7.5 mV
Lead Channel Setting Pacing Amplitude: 2 V
Lead Channel Setting Pacing Amplitude: 2.5 V
Lead Channel Setting Pacing Pulse Width: 0.5 ms
Lead Channel Setting Sensing Sensitivity: 0.5 mV
Pulse Gen Serial Number: 7398151
Zone Setting Status: 755011

## 2023-01-11 ENCOUNTER — Encounter (HOSPITAL_COMMUNITY): Payer: Medicare HMO

## 2023-01-14 ENCOUNTER — Encounter (HOSPITAL_COMMUNITY): Payer: Medicare HMO

## 2023-01-14 ENCOUNTER — Encounter (HOSPITAL_COMMUNITY)
Admission: RE | Admit: 2023-01-14 | Discharge: 2023-01-14 | Disposition: A | Discharge status: Home / Self Care | Payer: Medicare HMO | Source: Ambulatory Visit | Attending: Cardiology | Admitting: Cardiology

## 2023-01-14 DIAGNOSIS — I5022 Chronic systolic (congestive) heart failure: Secondary | ICD-10-CM | POA: Insufficient documentation

## 2023-01-14 DIAGNOSIS — Z5189 Encounter for other specified aftercare: Secondary | ICD-10-CM | POA: Insufficient documentation

## 2023-01-16 ENCOUNTER — Encounter (HOSPITAL_COMMUNITY): Payer: Medicare HMO

## 2023-01-18 ENCOUNTER — Encounter (HOSPITAL_COMMUNITY): Payer: Medicare HMO

## 2023-01-18 ENCOUNTER — Encounter (HOSPITAL_COMMUNITY)
Admission: RE | Admit: 2023-01-18 | Discharge: 2023-01-18 | Disposition: A | Discharge status: Home / Self Care | Payer: Medicare HMO | Source: Ambulatory Visit | Attending: Cardiology

## 2023-01-18 DIAGNOSIS — Z5189 Encounter for other specified aftercare: Secondary | ICD-10-CM | POA: Diagnosis not present

## 2023-01-18 DIAGNOSIS — I5022 Chronic systolic (congestive) heart failure: Secondary | ICD-10-CM | POA: Diagnosis not present

## 2023-01-21 ENCOUNTER — Encounter (HOSPITAL_COMMUNITY): Payer: Medicare HMO

## 2023-01-21 ENCOUNTER — Encounter (HOSPITAL_COMMUNITY)
Admission: RE | Admit: 2023-01-21 | Discharge: 2023-01-21 | Disposition: A | Discharge status: Home / Self Care | Payer: Medicare HMO | Source: Ambulatory Visit | Attending: Cardiology

## 2023-01-21 DIAGNOSIS — I5022 Chronic systolic (congestive) heart failure: Secondary | ICD-10-CM

## 2023-01-21 DIAGNOSIS — Z5189 Encounter for other specified aftercare: Secondary | ICD-10-CM | POA: Diagnosis not present

## 2023-01-21 NOTE — Progress Notes (Signed)
Daily Session Note  Patient Details  Name: Jon Williams MRN: [id number] Date of Birth: Jul 02, 1950 Referring Provider:   Flowsheet Row INTENSIVE CARDIAC REHAB ORIENT from 12/10/2022 in [facility]  Referring Provider Marca Ancona, MD       Encounter Date: 01/21/2023  Comments: Pt had to stop exercise after 22 minutes on the NuStep. HR around 140s, HR Max 117. Pt resting in chair, HR still elevated after about 5 minutes. Pt skipped weights today, pt in cool down to get HR lowered. After ~15 min HR ~120s, BP 94/68, pt asymptomatic. After cool down pt HR 86, BP WNL, pt asymptomatic.

## 2023-01-22 DIAGNOSIS — M71571 Other bursitis, not elsewhere classified, right ankle and foot: Secondary | ICD-10-CM | POA: Diagnosis not present

## 2023-01-23 ENCOUNTER — Encounter (HOSPITAL_COMMUNITY): Payer: Medicare HMO

## 2023-01-23 ENCOUNTER — Encounter (HOSPITAL_COMMUNITY)
Admission: RE | Admit: 2023-01-23 | Discharge: 2023-01-23 | Disposition: A | Discharge status: Home / Self Care | Payer: Medicare HMO | Source: Ambulatory Visit | Attending: Cardiology | Admitting: Cardiology

## 2023-01-23 DIAGNOSIS — Z5189 Encounter for other specified aftercare: Secondary | ICD-10-CM | POA: Diagnosis not present

## 2023-01-23 DIAGNOSIS — I5022 Chronic systolic (congestive) heart failure: Secondary | ICD-10-CM

## 2023-01-23 NOTE — Progress Notes (Signed)
Cardiac Individual Treatment Plan  Patient Details  Name: Jon Williams MRN: [id number] Date of Birth: Jun 17, 1950 Referring Provider:   Flowsheet Row INTENSIVE CARDIAC REHAB ORIENT from 12/10/2022 in [facility]  Referring Provider Marca Ancona, MD       Initial Encounter Date:  Flowsheet Row INTENSIVE CARDIAC REHAB ORIENT from 12/10/2022 in [facility]  Date 12/10/22       Visit Diagnosis: Heart failure, chronic systolic (HCC)  Patient's Home Medications on Admission:  Current Outpatient Medications:    atorvastatin (LIPITOR) 40 MG tablet, Take 1 tablet (40 mg total) by mouth daily., Disp: 90 tablet, Rfl: 1   Biotin 1000 MCG tablet, Take 1,000 mcg by mouth daily with breakfast., Disp: , Rfl:    cholecalciferol (VITAMIN D3) 25 MCG (1000 UT) tablet, Take 1,000 Units by mouth daily with breakfast., Disp: , Rfl:    digoxin (LANOXIN) 0.125 MG tablet, Take 0.5 tablets (0.0625 mg total) by mouth daily., Disp: 15 tablet, Rfl: 3   empagliflozin (JARDIANCE) 10 MG TABS tablet, Take 1 tablet (10 mg total) by mouth daily before breakfast., Disp: 30 tablet, Rfl: 11   empagliflozin (JARDIANCE) 10 MG TABS tablet, Take 1 tablet (10 mg total) by mouth daily., Disp: 90 tablet, Rfl: 3   furosemide (LASIX) 40 MG tablet, Take 1 tablet (40 mg total) by mouth daily., Disp: 30 tablet, Rfl: 6   gabapentin (NEURONTIN) 100 MG capsule, Take 1 capsule (100 mg total) by mouth 2 (two) times daily., Disp: 180 capsule, Rfl: 1   ketoconazole (NIZORAL) 2 % cream, Apply 1 Application topically 2 (two) times daily., Disp: 30 g, Rfl: 3   loratadine (CLARITIN) 10 MG tablet, Take 10 mg by mouth daily., Disp: , Rfl:    metFORMIN (GLUCOPHAGE-XR) 500 MG 24 hr tablet, Take 500 mg by mouth every evening., Disp: , Rfl:    metFORMIN (GLUCOPHAGE-XR) 500 MG 24 hr tablet, Take 1 tablet (500 mg total) by mouth  with evening meal (Patient not taking: Reported on 10/29/2022), Disp: 90 tablet, Rfl: 3   metoprolol succinate (TOPROL-XL) 25 MG 24 hr tablet, Take 1 tablet (25 mg total) by mouth daily., Disp: 90 tablet, Rfl: 1   multivitamin (ONE-A-DAY MEN'S) TABS tablet, Take 1 tablet by mouth daily with breakfast., Disp: , Rfl:    mupirocin ointment (BACTROBAN) 2 %, Apply to wound twice a day., Disp: 22 g, Rfl: 0   Naftifine HCl (NAFTIN) 2 % GEL, Apply 1 application  topically every Monday, Wednesday, and Friday., Disp: , Rfl:    pantoprazole (PROTONIX) 40 MG tablet, Take 1 tablet (40 mg total) by mouth 2 (two) times daily., Disp: 180 tablet, Rfl: 3   potassium chloride (KLOR-CON) 10 MEQ tablet, Take 2 tablets (20 mEq total) by mouth daily., Disp: 60 tablet, Rfl: 3   sacubitril-valsartan (ENTRESTO) 24-26 MG, Take 1 tablet by mouth 2 (two) times daily., Disp: 180 tablet, Rfl: 1   spironolactone (ALDACTONE) 25 MG tablet, Take 1 tablet (25 mg total) by mouth daily., Disp: 90 tablet, Rfl: 1   Tavaborole 5 % SOLN, Apply 1 application  topically daily. Nail on left hand, Disp: , Rfl:    XARELTO 20 MG TABS tablet, TAKE 1 TABLET BY MOUTH EVERY DAY WITH DINNER (Patient taking differently: Take 20 mg by mouth daily.), Disp: 90 tablet, Rfl: 1  Past Medical History: Past Medical History:  Diagnosis Date   AICD (  automatic cardioverter/defibrillator) present 05/25/2016   biv icd   Bunion, right    Chronic combined systolic and diastolic CHF (congestive heart failure) (HCC)    Echo 1/18: Mild conc LVH, EF 15-20, severe diff HK, inf and inf-septal AK, Gr 3 DD, mild to mod MR, severe LAE, mod reduced RVSF, mod RAE, mild TR, PASP 50   Coronary artery disease involving native coronary artery without angina pectoris 04/17/2016   LHC 1/18: pLCx 30, mLCx 20, mRCA 40, dRCA 20, LVEDP 23, mean RA 8, PA 42/20, PCWP 17   Diabetes mellitus without complication (HCC)    Gastric cancer (HCC)    History of atrial fibrillation     History of atrial flutter    History of cardiomegaly 06/07/2016   Noted on CXR   History of colon polyps 06/28/2017   Noted on colonoscopy   Hypertension    LBBB (left bundle branch block)    Nausea vomiting and diarrhea 04/04/2021   NICM (nonischemic cardiomyopathy) (HCC)    Echo 1/18:  Mild conc LVH, EF 15-20, severe diff HK, inf and inf-septal AK, Gr 3 DD, mild to mod MR, severe LAE, mod reduced RVSF, mod RAE, mild TR, PASP 50   Other secondary pulmonary hypertension (HCC) 04/17/2016   Prostate cancer (HCC) 2019   Sigmoid diverticulosis 06/28/2017   Noted on colonoscopy    Tobacco Use: Social History   Tobacco Use  Smoking Status Never  Smokeless Tobacco Never    Labs: Review Flowsheet  More data exists      Latest Ref Rng & Units 06/06/2016 12/01/2020 04/01/2021 12/26/2021 10/30/2022  Labs for ITP Cardiac and Pulmonary Rehab  Cholestrol 100 - 199 mg/dL 295  - - - -  LDL (calc) 0 - 99 mg/dL 61  - - - -  HDL-C >62 mg/dL 60  - - - -  Trlycerides 0 - 149 mg/dL 53  - - - -  Hemoglobin A1c 4.8 - 5.6 % - 6.2  6.3  6.1  5.7   Bicarbonate 20.0 - 28.0 mmol/L 20.0 - 28.0 mmol/L - - 24.5  - 22.2  21.3   TCO2 22 - 32 mmol/L 22 - 32 mmol/L - - - - 23  22   Acid-base deficit 0.0 - 2.0 mmol/L 0.0 - 2.0 mmol/L - - - - 2.0  3.0   O2 Saturation % % - - 73.3  58.1  - 65  60     Details       Multiple values from one day are sorted in reverse-chronological order         Capillary Blood Glucose: Lab Results  Component Value Date   GLUCAP 94 12/21/2022   GLUCAP 101 (H) 12/21/2022   GLUCAP 110 (H) 12/19/2022   GLUCAP 93 12/19/2022   GLUCAP 89 12/17/2022     Exercise Target Goals: Exercise Program Goal: Individual exercise prescription set using results from initial 6 min walk test and THRR while considering  patient's activity barriers and safety.   Exercise Prescription Goal: Initial exercise prescription builds to 30-45 minutes a day of aerobic activity, 2-3 days per  week.  Home exercise guidelines will be given to patient during program as part of exercise prescription that the participant will acknowledge.  Activity Barriers & Risk Stratification:   6 Minute Walk:  6 Minute Walk     Row Name 12/10/22 1522         6 Minute Walk   Phase Initial     Distance  1150 feet  used go-cart     Walk Time 6 minutes     # of Rest Breaks 0     MPH 2.18     METS 2.43     RPE 9     Perceived Dyspnea  0     VO2 Peak 8.49     Symptoms Yes (comment)     Comments no pain, legs fatigued     Resting HR 64 bpm     Resting BP 108/70     Resting Oxygen Saturation  98 %     Exercise Oxygen Saturation  during 6 min walk 99 %     Max Ex. HR 109 bpm     Max Ex. BP 134/70     2 Minute Post BP 113/86              Oxygen Initial Assessment:   Oxygen Re-Evaluation:   Oxygen Discharge (Final Oxygen Re-Evaluation):   Initial Exercise Prescription:  Initial Exercise Prescription - 12/10/22 1500       Date of Initial Exercise RX and Referring Provider   Date 12/10/22    Referring Provider Marca Ancona, MD    Expected Discharge Date 03/06/23      NuStep   Level 1    SPM 70    Minutes 15    METs 2      Prescription Details   Frequency (times per week) 3    Duration Progress to 30 minutes of continuous aerobic without signs/symptoms of physical distress      Intensity   THRR 40-80% of Max Heartrate 58-117    Ratings of Perceived Exertion 11-13    Perceived Dyspnea 0-4      Progression   Progression Continue progressive overload as per policy without signs/symptoms or physical distress.      Resistance Training   Training Prescription Yes    Weight 2    Reps 10-15             Perform Capillary Blood Glucose checks as needed.  Exercise Prescription Changes:   Exercise Prescription Changes     Row Name 12/19/22 1500 01/04/23 1500           Response to Exercise   Blood Pressure (Admit) 116/68 102/60      Blood Pressure  (Exercise) 128/68 128/70      Blood Pressure (Exit) 115/95 108/60      Heart Rate (Admit) 66 bpm 64 bpm      Heart Rate (Exercise) 101 bpm 121 bpm      Heart Rate (Exit) 66 bpm --      Rating of Perceived Exertion (Exercise) 8 12      Symptoms None None      Comments Pt's first day in the cRP2 program Reviewed METs      Duration Continue with 30 min of aerobic exercise without signs/symptoms of physical distress. Continue with 30 min of aerobic exercise without signs/symptoms of physical distress.      Intensity THRR unchanged THRR unchanged        Progression   Progression Continue to progress workloads to maintain intensity without signs/symptoms of physical distress. Continue to progress workloads to maintain intensity without signs/symptoms of physical distress.      Average METs 1.9 2.7        Resistance Training   Training Prescription No Yes      Weight No weights on wednesdays 2 lbs      Reps --  10-15      Time -- 10 Minutes        Interval Training   Interval Training No No        NuStep   Level 1 1      SPM 79 --      Minutes 30 30      METs 1.9 2.7               Exercise Comments:   Exercise Comments     Row Name 12/19/22 1500 01/04/23 1310 01/18/23 1622       Exercise Comments Pt's first day in the CRP2 program. Pt had blood pressure of 88/60, H2O given, 96/56. Reck 115/95. Pt was asymptomatic at all times. Reviewed METs with patient. He will increase workload to level 2 next session. Making good progress. Reviewed METs and goals. Pt is happy with the Nustep and wants no changes at this time.              Exercise Goals and Review:   Exercise Goals     Row Name 12/10/22 1328             Exercise Goals   Increase Physical Activity Yes       Intervention Provide advice, education, support and counseling about physical activity/exercise needs.;Develop an individualized exercise prescription for aerobic and resistive training based on initial  evaluation findings, risk stratification, comorbidities and participant's personal goals.       Expected Outcomes Short Term: Attend rehab on a regular basis to increase amount of physical activity.;Long Term: Exercising regularly at least 3-5 days a week.;Long Term: Add in home exercise to make exercise part of routine and to increase amount of physical activity.       Increase Strength and Stamina Yes       Intervention Provide advice, education, support and counseling about physical activity/exercise needs.;Develop an individualized exercise prescription for aerobic and resistive training based on initial evaluation findings, risk stratification, comorbidities and participant's personal goals.       Expected Outcomes Short Term: Increase workloads from initial exercise prescription for resistance, speed, and METs.;Short Term: Perform resistance training exercises routinely during rehab and add in resistance training at home;Long Term: Improve cardiorespiratory fitness, muscular endurance and strength as measured by increased METs and functional capacity ( )       Able to understand and use rate of perceived exertion (RPE) scale Yes       Intervention Provide education and explanation on how to use RPE scale       Expected Outcomes Short Term: Able to use RPE daily in rehab to express subjective intensity level;Long Term:  Able to use RPE to guide intensity level when exercising independently       Knowledge and understanding of Target Heart Rate Range (THRR) Yes       Intervention Provide education and explanation of THRR including how the numbers were predicted and where they are located for reference       Expected Outcomes Short Term: Able to state/look up THRR;Short Term: Able to use daily as guideline for intensity in rehab;Long Term: Able to use THRR to govern intensity when exercising independently       Understanding of Exercise Prescription Yes       Intervention Provide education,  explanation, and written materials on patient's individual exercise prescription       Expected Outcomes Short Term: Able to explain program exercise prescription;Long Term: Able to explain home exercise prescription  to exercise independently                Exercise Goals Re-Evaluation :  Exercise Goals Re-Evaluation     Row Name 12/19/22 1500 01/18/23 1557 01/18/23 1619         Exercise Goal Re-Evaluation   Exercise Goals Review Increase Physical Activity;Understanding of Exercise Prescription;Increase Strength and Stamina;Knowledge and understanding of Target Heart Rate Range (THRR);Able to understand and use rate of perceived exertion (RPE) scale -- Increase Physical Activity;Understanding of Exercise Prescription;Increase Strength and Stamina;Knowledge and understanding of Target Heart Rate Range (THRR);Able to understand and use rate of perceived exertion (RPE) scale     Comments Pt's first day in the CRP2 program. Pt understnads the exercise Rx, RPE sclae and THRR -- Reviewed METs and goals. Pt voices progress on all his goals: increased strength and stamina, being more independent, improve blance and walk better. Peak METs to date are 3.1.     Expected Outcomes Will continue to monitor patient and progress exercise workloads as tolerated, -- Will continue to monitor patient and progress exercise workloads as tolerated.              Discharge Exercise Prescription (Final Exercise Prescription Changes):  Exercise Prescription Changes - 01/04/23 1500       Response to Exercise   Blood Pressure (Admit) 102/60    Blood Pressure (Exercise) 128/70    Blood Pressure (Exit) 108/60    Heart Rate (Admit) 64 bpm    Heart Rate (Exercise) 121 bpm    Rating of Perceived Exertion (Exercise) 12    Symptoms None    Comments Reviewed METs    Duration Continue with 30 min of aerobic exercise without signs/symptoms of physical distress.    Intensity THRR unchanged      Progression    Progression Continue to progress workloads to maintain intensity without signs/symptoms of physical distress.    Average METs 2.7      Resistance Training   Training Prescription Yes    Weight 2 lbs    Reps 10-15    Time 10 Minutes      Interval Training   Interval Training No      NuStep   Level 1    Minutes 30    METs 2.7             Nutrition:  Target Goals: Understanding of nutrition guidelines, daily intake of sodium 1500mg , cholesterol 200mg , calories 30% from fat and 7% or less from saturated fats, daily to have 5 or more servings of fruits and vegetables.  Biometrics:  Pre Biometrics - 12/10/22 1356       Pre Biometrics   Waist Circumference 40.5 inches    Hip Circumference 40 inches    Waist to Hip Ratio 1.01 %    Triceps Skinfold 20 mm    % Body Fat 29.9 %    Grip Strength 24 kg    Flexibility 14 in    Single Leg Stand 0 seconds   did not attempt, pt has poor balance             Nutrition Therapy Plan and Nutrition Goals:  Nutrition Therapy & Goals - 12/18/22 1123       Nutrition Therapy   Diet Heart Healthy Diet    Drug/Food Interactions Statins/Certain Fruits      Personal Nutrition Goals   Nutrition Goal Patient to identify strategies for reducing cardiovascular risk by attending the [employer] education and nutrition series weekly.  Personal Goal #2 Patient to improve diet quality by using the plate method as a guide for meal planning to include lean protein/plant protein, fruits, vegetables, whole grains, nonfat dairy as part of a well-balanced diet    Personal Goal #3 Patient to limit sodium intake to 1500mg  per day    Comments Patient has medical history of CHF, DM2, ICD in place, history of gastric cancer. He continues regular follow-up with oncology. His A1c is well controlled in a pre-diabetic range. Patient will benefit from participation in intensive cardiac rehab for nutrition, exercise, and lifestyle modification.       Intervention Plan   Intervention Prescribe, educate and counsel regarding individualized specific dietary modifications aiming towards targeted core components such as weight, hypertension, lipid management, diabetes, heart failure and other comorbidities.;Nutrition handout(s) given to patient.    Expected Outcomes Short Term Goal: Understand basic principles of dietary content, such as calories, fat, sodium, cholesterol and nutrients.;Long Term Goal: Adherence to prescribed nutrition plan.             Nutrition Assessments:  MEDIFICTS Score Key: >=70 Need to make dietary changes  40-70 Heart Healthy Diet <= 40 Therapeutic Level Cholesterol Diet    Picture Your Plate Scores: <21 Unhealthy dietary pattern with much room for improvement. 41-50 Dietary pattern unlikely to meet recommendations for good health and room for improvement. 51-60 More healthful dietary pattern, with some room for improvement.  >60 Healthy dietary pattern, although there may be some specific behaviors that could be improved.    Nutrition Goals Re-Evaluation:  Nutrition Goals Re-Evaluation     Row Name 12/18/22 1123             Goals   Current Weight 180 lb 1.9 oz (81.7 kg)       Comment Lpa 189, A1c 5.7, lipids WNL, LDL ,70 (63)       Expected Outcome Patient has medical history of CHF, DM2, ICD in place, history of gastric cancer. He continues regular follow-up with oncology. His A1c is well controlled in a pre-diabetic range. Patient will benefit from participation in intensive cardiac rehab for nutrition, exercise, and lifestyle modification.                Nutrition Goals Re-Evaluation:  Nutrition Goals Re-Evaluation     Row Name 12/18/22 1123             Goals   Current Weight 180 lb 1.9 oz (81.7 kg)       Comment Lpa 189, A1c 5.7, lipids WNL, LDL ,70 (63)       Expected Outcome Patient has medical history of CHF, DM2, ICD in place, history of gastric cancer. He continues regular  follow-up with oncology. His A1c is well controlled in a pre-diabetic range. Patient will benefit from participation in intensive cardiac rehab for nutrition, exercise, and lifestyle modification.                Nutrition Goals Discharge (Final Nutrition Goals Re-Evaluation):  Nutrition Goals Re-Evaluation - 12/18/22 1123       Goals   Current Weight 180 lb 1.9 oz (81.7 kg)    Comment Lpa 189, A1c 5.7, lipids WNL, LDL ,70 (63)    Expected Outcome Patient has medical history of CHF, DM2, ICD in place, history of gastric cancer. He continues regular follow-up with oncology. His A1c is well controlled in a pre-diabetic range. Patient will benefit from participation in intensive cardiac rehab for nutrition, exercise, and lifestyle modification.  Psychosocial: Target Goals: Acknowledge presence or absence of significant depression and/or stress, maximize coping skills, provide positive support system. Participant is able to verbalize types and ability to use techniques and skills needed for reducing stress and depression.  Initial Review & Psychosocial Screening:  Initial Psych Review & Screening - 12/10/22 1411       Initial Review   Current issues with None Identified      Family Dynamics   Good Support System? Yes   Amyas has his wife for support     Barriers   Psychosocial barriers to participate in program There are no identifiable barriers or psychosocial needs.      Screening Interventions   Interventions Encouraged to exercise;Provide feedback about the scores to participant    Expected Outcomes Short Term goal: Identification and review with participant of any Quality of Life or Depression concerns found by scoring the questionnaire.;Long Term goal: The participant improves quality of Life and PHQ9 Scores as seen by post scores and/or verbalization of changes             Quality of Life Scores:  Quality of Life - 12/10/22 1431       Quality of Life    Select Quality of Life      Quality of Life Scores   Health/Function Pre 26.23 %    Socioeconomic Pre 29 %    Psych/Spiritual Pre 24.86 %    Family Pre 30 %    GLOBAL Pre 27.02 %            Scores of 19 and below usually indicate a poorer quality of life in these areas.  A difference of  2-3 points is a clinically meaningful difference.  A difference of 2-3 points in the total score of the Quality of Life Index has been associated with significant improvement in overall quality of life, self-image, physical symptoms, and general health in studies assessing change in quality of life.  PHQ-9: Review Flowsheet       12/10/2022 12/03/2019 04/02/2017  Depression screen PHQ 2/9  Decreased Interest 0 0   Down, Depressed, Hopeless 0 0   PHQ - 2 Score 0 0   Altered sleeping 0 - -  Tired, decreased energy 0 - -  Change in appetite 0 - -  Feeling bad or failure about yourself  0 - -  Trouble concentrating 0 - -  Moving slowly or fidgety/restless 0 - -  Suicidal thoughts 0 - -  PHQ-9 Score 0 - -    Details       Information is confidential and restricted. Go to Review Flowsheets to unlock data.        Interpretation of Total Score  Total Score Depression Severity:  1-4 = Minimal depression, 5-9 = Mild depression, 10-14 = Moderate depression, 15-19 = Moderately severe depression, 20-27 = Severe depression   Psychosocial Evaluation and Intervention:   Psychosocial Re-Evaluation:  Psychosocial Re-Evaluation     Row Name 12/20/22 0855 01/23/23 1705           Psychosocial Re-Evaluation   Current issues with None Identified None Identified      Interventions Encouraged to attend Cardiac Rehabilitation for the exercise Encouraged to attend Cardiac Rehabilitation for the exercise      Continue Psychosocial Services  No Follow up required No Follow up required               Psychosocial Discharge (Final Psychosocial Re-Evaluation):  Psychosocial Re-Evaluation -  01/23/23  1705       Psychosocial Re-Evaluation   Current issues with None Identified    Interventions Encouraged to attend Cardiac Rehabilitation for the exercise    Continue Psychosocial Services  No Follow up required             Vocational Rehabilitation: Provide vocational rehab assistance to qualifying candidates.   Vocational Rehab Evaluation & Intervention:  Vocational Rehab - 12/10/22 1408       Initial Vocational Rehab Evaluation & Intervention   Assessment shows need for Vocational Rehabilitation No   Ali is retired            Education: Education Goals: Education classes will be provided on a weekly basis, covering required topics. Participant will state understanding/return demonstration of topics presented.    Education     Row Name 12/19/22 1500     Education   Cardiac Education Topics [employer]   Orthoptist   Educator Dietitian   Weekly Topic Powerhouse Plant-Based Proteins   Instruction Review Code 1- Verbalizes Understanding   Class Start Time 1400   Class Stop Time 1438   Class Time Calculation (min) 38 min    Row Name 12/21/22 1400     Education   Cardiac Education Topics [facility]   Hospital doctor Education   General Education Hypertension and Heart Disease   Instruction Review Code 1- Verbalizes Understanding   Class Start Time 1357   Class Stop Time 1430   Class Time Calculation (min) 33 min    Row Name 12/24/22 1600     Education   Cardiac Education Topics [employer]   Geographical information systems officer Psychosocial   Psychosocial Workshop From Head to Heart: The Power of a Healthy Outlook   Instruction Review Code 1- Verbalizes Understanding   Class Start Time 1400   Class Stop Time 1450   Class Time Calculation (min) 50 min    Row Name 12/28/22 1500     Education   Cardiac  Education Topics [facility]   Hospital doctor Education   General Education Heart Disease Risk Reduction   Instruction Review Code 1- Verbalizes Understanding   Class Start Time 1400   Class Stop Time 1433   Class Time Calculation (min) 33 min    Row Name 01/02/23 1300     Education   Cardiac Education Topics [employer]   Customer service manager   Weekly Topic Fast and Healthy Breakfasts   Instruction Review Code 1- Verbalizes Understanding   Class Start Time 1355   Class Stop Time 1435   Class Time Calculation (min) 40 min    Row Name 01/04/23 1500     Education   Cardiac Education Topics [employer]   Licensed conveyancer Nutrition   Nutrition Overview of the [employer] Eating Plan   Instruction Review Code 1- Verbalizes Understanding   Class Start Time 1400   Class Stop Time 1445   Class Time Calculation (min) 45 min    Row Name 01/07/23 1300     Education   Cardiac Education Topics [facility]   Select Core  Videos     Core Videos   Educator Exercise Physiologist   Select Psychosocial   Psychosocial Healthy Minds, Bodies, Hearts   Instruction Review Code 1- Verbalizes Understanding   Class Start Time 1357   Class Stop Time 1429   Class Time Calculation (min) 32 min    Row Name 01/09/23 1400     Education   Cardiac Education Topics [facility]   Nurse, children's   Educator Nurse   Select Nutrition   Nutrition Becoming a [employer] Chef   Instruction Review Code 1- Verbalizes Understanding   Class Start Time 1356    Row Name 01/14/23 1400     Education   Cardiac Education Topics Pritikin   Select Core Videos     Core Videos   Educator Exercise Physiologist   Select Nutrition   Nutrition Other  Label Reading   Instruction Review Code 1- Verbalizes Understanding   Class Start Time 1356    Class Stop Time 1430   Class Time Calculation (min) 34 min    Row Name 01/18/23 1400     Education   Cardiac Education Topics Pritikin   Glass blower/designer Nutrition   Nutrition Workshop Label Reading   Instruction Review Code 1- Verbalizes Understanding   Class Start Time 1401   Class Stop Time 1433   Class Time Calculation (min) 32 min    Row Name 01/21/23 1600     Education   Cardiac Education Topics Pritikin   Select Workshops     Workshops   Educator Exercise Physiologist   Select Psychosocial   Psychosocial Workshop Recognizing and Reducing Stress   Instruction Review Code 1- Verbalizes Understanding   Class Start Time 1359   Class Stop Time 1445   Class Time Calculation (min) 46 min    Row Name 01/23/23 1500     Education   Cardiac Education Topics Pritikin   Customer service manager   Weekly Topic Efficiency Cooking - Meals in a Snap   Instruction Review Code 1- Verbalizes Understanding   Class Start Time 1358   Class Stop Time 1438   Class Time Calculation (min) 40 min            Core Videos: Exercise    Move It!  Clinical staff conducted group or individual video education with verbal and written material and guidebook.  Patient learns the recommended Pritikin exercise program. Exercise with the goal of living a long, healthy life. Some of the health benefits of exercise include controlled diabetes, healthier blood pressure levels, improved cholesterol levels, improved heart and lung capacity, improved sleep, and better body composition. Everyone should speak with their doctor before starting or changing an exercise routine.  Biomechanical Limitations Clinical staff conducted group or individual video education with verbal and written material and guidebook.  Patient learns how biomechanical limitations can impact exercise and how we can mitigate and possibly overcome  limitations to have an impactful and balanced exercise routine.  Body Composition Clinical staff conducted group or individual video education with verbal and written material and guidebook.  Patient learns that body composition (ratio of muscle mass to fat mass) is a key component to assessing overall fitness, rather than body weight alone. Increased fat mass, especially visceral belly fat, can put [place] at increased risk for metabolic syndrome, type 2 diabetes, heart disease,  and even death. It is recommended to combine diet and exercise (cardiovascular and resistance training) to improve your body composition. Seek guidance from your physician and exercise physiologist before implementing an exercise routine.  Exercise Action Plan Clinical staff conducted group or individual video education with verbal and written material and guidebook.  Patient learns the recommended strategies to achieve and enjoy long-term exercise adherence, including variety, self-motivation, self-efficacy, and positive decision making. Benefits of exercise include fitness, good health, weight management, more energy, better sleep, less stress, and overall well-being.  Medical   Heart Disease Risk Reduction Clinical staff conducted group or individual video education with verbal and written material and guidebook.  Patient learns our heart is our most vital organ as it circulates oxygen, nutrients, white blood cells, and hormones throughout the entire body, and carries waste away. Data supports a plant-based eating plan like the Pritikin Program for its effectiveness in slowing progression of and reversing heart disease. The video provides a number of recommendations to address heart disease.   Metabolic Syndrome and Belly Fat  Clinical staff conducted group or individual video education with verbal and written material and guidebook.  Patient learns what metabolic syndrome is, how it leads to heart disease, and how one can  reverse it and keep it from coming back. You have metabolic syndrome if you have 3 of the following 5 criteria: abdominal obesity, high blood pressure, high triglycerides, low HDL cholesterol, and high blood sugar.  Hypertension and Heart Disease Clinical staff conducted group or individual video education with verbal and written material and guidebook.  Patient learns that high blood pressure, or hypertension, is very common in the [place]. Hypertension is largely due to excessive salt intake, but other important risk factors include being overweight, physical inactivity, drinking too much alcohol, smoking, and not eating enough potassium from fruits and vegetables. High blood pressure is a leading risk factor for heart attack, stroke, congestive heart failure, dementia, kidney failure, and premature death. Long-term effects of excessive salt intake include stiffening of the arteries and thickening of heart muscle and organ damage. Recommendations include ways to reduce hypertension and the risk of heart disease.  Diseases of Our Time - Focusing on Diabetes Clinical staff conducted group or individual video education with verbal and written material and guidebook.  Patient learns why the best way to stop diseases of our time is prevention, through food and other lifestyle changes. Medicine (such as prescription pills and surgeries) is often only a Band-Aid on the problem, not a long-term solution. Most common diseases of our time include obesity, type 2 diabetes, hypertension, heart disease, and cancer. The Pritikin Program is recommended and has been proven to help reduce, reverse, and/or prevent the damaging effects of metabolic syndrome.  Nutrition   Overview of the Pritikin Eating Plan  Clinical staff conducted group or individual video education with verbal and written material and guidebook.  Patient learns about the Pritikin Eating Plan for disease risk reduction. The Pritikin Eating Plan  emphasizes a wide variety of unrefined, minimally-processed carbohydrates, like fruits, vegetables, whole grains, and legumes. Go, Caution, and Stop food choices are explained. Plant-based and lean animal proteins are emphasized. Rationale provided for low sodium intake for blood pressure control, low added sugars for blood sugar stabilization, and low added fats and oils for coronary artery disease risk reduction and weight management.  Calorie Density  Clinical staff conducted group or individual video education with verbal and written material and guidebook.  Patient learns about calorie  density and how it impacts the Pritikin Eating Plan. Knowing the characteristics of the food you choose will help you decide whether those foods will lead to weight gain or weight loss, and whether you want to consume more or less of them. Weight loss is usually a side effect of the Pritikin Eating Plan because of its focus on low calorie-dense foods.  Label Reading  Clinical staff conducted group or individual video education with verbal and written material and guidebook.  Patient learns about the Pritikin recommended label reading guidelines and corresponding recommendations regarding calorie density, added sugars, sodium content, and whole grains.  Dining Out - Part 1  Clinical staff conducted group or individual video education with verbal and written material and guidebook.  Patient learns that restaurant meals can be sabotaging because they can be so high in calories, fat, sodium, and/or sugar. Patient learns recommended strategies on how to positively address this and avoid unhealthy pitfalls.  Facts on Fats  Clinical staff conducted group or individual video education with verbal and written material and guidebook.  Patient learns that lifestyle modifications can be just as effective, if not more so, as many medications for lowering your risk of heart disease. A Pritikin lifestyle can help to reduce your  risk of inflammation and atherosclerosis (cholesterol build-up, or plaque, in the artery walls). Lifestyle interventions such as dietary choices and physical activity address the cause of atherosclerosis. A review of the types of fats and their impact on blood cholesterol levels, along with dietary recommendations to reduce fat intake is also included.  Nutrition Action Plan  Clinical staff conducted group or individual video education with verbal and written material and guidebook.  Patient learns how to incorporate Pritikin recommendations into their lifestyle. Recommendations include planning and keeping personal health goals in mind as an important part of their success.  Healthy Mind-Set    Healthy Minds, Bodies, Hearts  Clinical staff conducted group or individual video education with verbal and written material and guidebook.  Patient learns how to identify when they are stressed. Video will discuss the impact of that stress, as well as the many benefits of stress management. Patient will also be introduced to stress management techniques. The way we think, act, and feel has an impact on our hearts.  How Our Thoughts Can Heal Our Hearts  Clinical staff conducted group or individual video education with verbal and written material and guidebook.  Patient learns that negative thoughts can cause depression and anxiety. This can result in negative lifestyle behavior and serious health problems. Cognitive behavioral therapy is an effective method to help control our thoughts in order to change and improve our emotional outlook.  Additional Videos:  Exercise    Improving Performance  Clinical staff conducted group or individual video education with verbal and written material and guidebook.  Patient learns to use a non-linear approach by alternating intensity levels and lengths of time spent exercising to help burn more calories and lose more body fat. Cardiovascular exercise helps improve heart  health, metabolism, hormonal balance, blood sugar control, and recovery from fatigue. Resistance training improves strength, endurance, balance, coordination, reaction time, metabolism, and muscle mass. Flexibility exercise improves circulation, posture, and balance. Seek guidance from your physician and exercise physiologist before implementing an exercise routine and learn your capabilities and proper form for all exercise.  Introduction to Yoga  Clinical staff conducted group or individual video education with verbal and written material and guidebook.  Patient learns about yoga, a discipline of  the coming together of mind, breath, and body. The benefits of yoga include improved flexibility, improved range of motion, better posture and core strength, increased lung function, weight loss, and positive self-image. Yoga's heart health benefits include lowered blood pressure, healthier heart rate, decreased cholesterol and triglyceride levels, improved immune function, and reduced stress. Seek guidance from your physician and exercise physiologist before implementing an exercise routine and learn your capabilities and proper form for all exercise.  Medical   Aging: Enhancing Your Quality of Life  Clinical staff conducted group or individual video education with verbal and written material and guidebook.  Patient learns key strategies and recommendations to stay in good physical health and enhance quality of life, such as prevention strategies, having an advocate, securing a Health Care Proxy and Power of Attorney, and keeping a list of medications and system for tracking them. It also discusses how to avoid risk for bone loss.  Biology of Weight Control  Clinical staff conducted group or individual video education with verbal and written material and guidebook.  Patient learns that weight gain occurs because we consume more calories than we burn (eating more, moving less). Even if your body weight is  normal, you may have higher ratios of fat compared to muscle mass. Too much body fat puts you at increased risk for cardiovascular disease, heart attack, stroke, type 2 diabetes, and obesity-related cancers. In addition to exercise, following the Pritikin Eating Plan can help reduce your risk.  Decoding Lab Results  Clinical staff conducted group or individual video education with verbal and written material and guidebook.  Patient learns that lab test reflects one measurement whose values change over time and are influenced by many factors, including medication, stress, sleep, exercise, food, hydration, pre-existing medical conditions, and more. It is recommended to use the knowledge from this video to become more involved with your lab results and evaluate your numbers to speak with your doctor.   Diseases of Our Time - Overview  Clinical staff conducted group or individual video education with verbal and written material and guidebook.  Patient learns that according to the CDC, 50% to 70% of chronic diseases (such as obesity, type 2 diabetes, elevated lipids, hypertension, and heart disease) are avoidable through lifestyle improvements including healthier food choices, listening to satiety cues, and increased physical activity.  Sleep Disorders Clinical staff conducted group or individual video education with verbal and written material and guidebook.  Patient learns how good quality and duration of sleep are important to overall health and well-being. Patient also learns about sleep disorders and how they impact health along with recommendations to address them, including discussing with a physician.  Nutrition  Dining Out - Part 2 Clinical staff conducted group or individual video education with verbal and written material and guidebook.  Patient learns how to plan ahead and communicate in order to maximize their dining experience in a healthy and nutritious manner. Included are recommended  food choices based on the type of restaurant the patient is visiting.   Fueling a Banker conducted group or individual video education with verbal and written material and guidebook.  There is a strong connection between our food choices and our health. Diseases like obesity and type 2 diabetes are very prevalent and are in large-part due to lifestyle choices. The Pritikin Eating Plan provides plenty of food and hunger-curbing satisfaction. It is easy to follow, affordable, and helps reduce health risks.  Menu Workshop  Clinical staff conducted group or  individual video education with verbal and written material and guidebook.  Patient learns that restaurant meals can sabotage health goals because they are often packed with calories, fat, sodium, and sugar. Recommendations include strategies to plan ahead and to communicate with the manager, chef, or server to help order a healthier meal.  Planning Your Eating Strategy  Clinical staff conducted group or individual video education with verbal and written material and guidebook.  Patient learns about the Pritikin Eating Plan and its benefit of reducing the risk of disease. The Pritikin Eating Plan does not focus on calories. Instead, it emphasizes high-quality, nutrient-rich foods. By knowing the characteristics of the foods, we choose, we can determine their calorie density and make informed decisions.  Targeting Your Nutrition Priorities  Clinical staff conducted group or individual video education with verbal and written material and guidebook.  Patient learns that lifestyle habits have a tremendous impact on disease risk and progression. This video provides eating and physical activity recommendations based on your personal health goals, such as reducing LDL cholesterol, losing weight, preventing or controlling type 2 diabetes, and reducing high blood pressure.  Vitamins and Minerals  Clinical staff conducted group or  individual video education with verbal and written material and guidebook.  Patient learns different ways to obtain key vitamins and minerals, including through a recommended healthy diet. It is important to discuss all supplements you take with your doctor.   Healthy Mind-Set    Smoking Cessation  Clinical staff conducted group or individual video education with verbal and written material and guidebook.  Patient learns that cigarette smoking and tobacco addiction pose a serious health risk which affects millions of people. Stopping smoking will significantly reduce the risk of heart disease, lung disease, and many forms of cancer. Recommended strategies for quitting are covered, including working with your doctor to develop a successful plan.  Culinary   Becoming a Set designer conducted group or individual video education with verbal and written material and guidebook.  Patient learns that cooking at home can be healthy, cost-effective, quick, and puts them in control. Keys to cooking healthy recipes will include looking at your recipe, assessing your equipment needs, planning ahead, making it simple, choosing cost-effective seasonal ingredients, and limiting the use of added fats, salts, and sugars.  Cooking - Breakfast and Snacks  Clinical staff conducted group or individual video education with verbal and written material and guidebook.  Patient learns how important breakfast is to satiety and nutrition through the entire day. Recommendations include key foods to eat during breakfast to help stabilize blood sugar levels and to prevent overeating at meals later in the day. Planning ahead is also a key component.  Cooking - Educational psychologist conducted group or individual video education with verbal and written material and guidebook.  Patient learns eating strategies to improve overall health, including an approach to cook more at home. Recommendations include  thinking of animal protein as a side on your plate rather than center stage and focusing instead on lower calorie dense options like vegetables, fruits, whole grains, and plant-based proteins, such as beans. Making sauces in large quantities to freeze for later and leaving the skin on your vegetables are also recommended to maximize your experience.  Cooking - Healthy Salads and Dressing Clinical staff conducted group or individual video education with verbal and written material and guidebook.  Patient learns that vegetables, fruits, whole grains, and legumes are the foundations of the Pritikin Eating Plan.  Recommendations include how to incorporate each of these in flavorful and healthy salads, and how to create homemade salad dressings. Proper handling of ingredients is also covered. Cooking - Soups and [employer] - Soups and Desserts Clinical staff conducted group or individual video education with verbal and written material and guidebook.  Patient learns that Pritikin soups and desserts make for easy, nutritious, and delicious snacks and meal components that are low in sodium, fat, sugar, and calorie density, while high in vitamins, minerals, and filling fiber. Recommendations include simple and healthy ideas for soups and desserts.   Overview     The Pritikin Solution Program Overview Clinical staff conducted group or individual video education with verbal and written material and guidebook.  Patient learns that the results of the Pritikin Program have been documented in more than 100 articles published in peer-reviewed journals, and the benefits include reducing risk factors for (and, in some cases, even reversing) high cholesterol, high blood pressure, type 2 diabetes, obesity, and more! An overview of the three key pillars of the Pritikin Program will be covered: eating well, doing regular exercise, and having a healthy mind-set.  WORKSHOPS  Exercise: Exercise Basics: Building Your  Action Plan Clinical staff led group instruction and group discussion with PowerPoint presentation and patient guidebook. To enhance the learning environment the use of posters, models and videos may be added. At the conclusion of this workshop, patients will comprehend the difference between physical activity and exercise, as well as the benefits of incorporating both, into their routine. Patients will understand the FITT (Frequency, Intensity, Time, and Type) principle and how to use it to build an exercise action plan. In addition, safety concerns and other considerations for exercise and cardiac rehab will be addressed by the presenter. The purpose of this lesson is to promote a comprehensive and effective weekly exercise routine in order to improve patients' overall level of fitness.   Managing Heart Disease: Your Path to a Healthier Heart Clinical staff led group instruction and group discussion with PowerPoint presentation and patient guidebook. To enhance the learning environment the use of posters, models and videos may be added.At the conclusion of this workshop, patients will understand the anatomy and physiology of the heart. Additionally, they will understand how Pritikin's three pillars impact the risk factors, the progression, and the management of heart disease.  The purpose of this lesson is to provide a high-level overview of the heart, heart disease, and how the Pritikin lifestyle positively impacts risk factors.  Exercise Biomechanics Clinical staff led group instruction and group discussion with PowerPoint presentation and patient guidebook. To enhance the learning environment the use of posters, models and videos may be added. Patients will learn how the structural parts of their bodies function and how these functions impact their daily activities, movement, and exercise. Patients will learn how to promote a neutral spine, learn how to manage pain, and identify ways to  improve their physical movement in order to promote healthy living. The purpose of this lesson is to expose patients to common physical limitations that impact physical activity. Participants will learn practical ways to adapt and manage aches and pains, and to minimize their effect on regular exercise. Patients will learn how to maintain good posture while sitting, walking, and lifting.  Balance Training and Fall Prevention  Clinical staff led group instruction and group discussion with PowerPoint presentation and patient guidebook. To enhance the learning environment the use of posters, models and videos may be added. At the  conclusion of this workshop, patients will understand the importance of their sensorimotor skills (vision, proprioception, and the vestibular system) in maintaining their ability to balance as they age. Patients will apply a variety of balancing exercises that are appropriate for their current level of function. Patients will understand the common causes for poor balance, possible solutions to these problems, and ways to modify their physical environment in order to minimize their fall risk. The purpose of this lesson is to teach patients about the importance of maintaining balance as they age and ways to minimize their risk of falling.  WORKSHOPS   Nutrition:  Fueling a Ship broker led group instruction and group discussion with PowerPoint presentation and patient guidebook. To enhance the learning environment the use of posters, models and videos may be added. Patients will review the foundational principles of the Pritikin Eating Plan and understand what constitutes a serving size in each of the food groups. Patients will also learn Pritikin-friendly foods that are better choices when away from home and review make-ahead meal and snack options. Calorie density will be reviewed and applied to three nutrition priorities: weight maintenance, weight loss, and  weight gain. The purpose of this lesson is to reinforce (in a group setting) the key concepts around what patients are recommended to eat and how to apply these guidelines when away from home by planning and selecting Pritikin-friendly options. Patients will understand how calorie density may be adjusted for different weight management goals.  Mindful Eating  Clinical staff led group instruction and group discussion with PowerPoint presentation and patient guidebook. To enhance the learning environment the use of posters, models and videos may be added. Patients will briefly review the concepts of the Pritikin Eating Plan and the importance of low-calorie dense foods. The concept of mindful eating will be introduced as well as the importance of paying attention to internal hunger signals. Triggers for non-hunger eating and techniques for dealing with triggers will be explored. The purpose of this lesson is to provide patients with the opportunity to review the basic principles of the Pritikin Eating Plan, discuss the value of eating mindfully and how to measure internal cues of hunger and fullness using the Hunger Scale. Patients will also discuss reasons for non-hunger eating and learn strategies to use for controlling emotional eating.  Targeting Your Nutrition Priorities Clinical staff led group instruction and group discussion with PowerPoint presentation and patient guidebook. To enhance the learning environment the use of posters, models and videos may be added. Patients will learn how to determine their genetic susceptibility to disease by reviewing their family history. Patients will gain insight into the importance of diet as part of an overall healthy lifestyle in mitigating the impact of genetics and other environmental insults. The purpose of this lesson is to provide patients with the opportunity to assess their personal nutrition priorities by looking at their family history, their own health  history and current risk factors. Patients will also be able to discuss ways of prioritizing and modifying the Pritikin Eating Plan for their highest risk areas  Menu  Clinical staff led group instruction and group discussion with PowerPoint presentation and patient guidebook. To enhance the learning environment the use of posters, models and videos may be added. Using menus brought in from [employer], or printed from [employer], patients will apply the Pritikin dining out guidelines that were presented in the [employer] video. Patients will also be able to practice these guidelines in a  variety of provided scenarios. The purpose of this lesson is to provide patients with the opportunity to practice hands-on learning of the Pritikin Dining Out guidelines with actual menus and practice scenarios.  Label Reading Clinical staff led group instruction and group discussion with PowerPoint presentation and patient guidebook. To enhance the learning environment the use of posters, models and videos may be added. Patients will review and discuss the Pritikin label reading guidelines presented in Pritikin's Label Reading Educational series video. Using fool labels brought in from local grocery stores and markets, patients will apply the label reading guidelines and determine if the packaged food meet the Pritikin guidelines. The purpose of this lesson is to provide patients with the opportunity to review, discuss, and practice hands-on learning of the Pritikin Label Reading guidelines with actual packaged food labels. Cooking School  Pritikin's [employer] are designed to teach patients ways to prepare quick, simple, and affordable recipes at home. The importance of nutrition's role in chronic disease risk reduction is reflected in its emphasis in the overall Pritikin program. By learning how to prepare essential core Pritikin Eating Plan recipes, patients will increase  control over what they eat; be able to customize the flavor of foods without the use of added salt, sugar, or fat; and improve the quality of the food they consume. By learning a set of core recipes which are easily assembled, quickly prepared, and affordable, patients are more likely to prepare more healthy foods at home. These workshops focus on convenient breakfasts, simple entres, side dishes, and desserts which can be prepared with minimal effort and are consistent with nutrition recommendations for cardiovascular risk reduction. Cooking [employer] are taught by a Armed forces logistics/support/administrative officer (RD) who has been trained by the [employer]. The chef or RD has a clear understanding of the importance of minimizing - if not completely eliminating - added fat, sugar, and sodium in recipes. Throughout the series of Cooking School Workshop sessions, patients will learn about healthy ingredients and efficient methods of cooking to build confidence in their capability to prepare    Cooking School weekly topics:  Adding Flavor- Sodium-Free  Fast and Healthy Breakfasts  Powerhouse Plant-Based Proteins  Satisfying Salads and Dressings  Simple Sides and Sauces  International Cuisine-Spotlight on the [employer] Zones  Delicious Desserts  Savory Soups  [employer] - Meals in a Astronomer Appetizers and Snacks  Comforting Weekend Breakfasts  One-Pot Wonders   Fast Evening Meals  Landscape architect Your Pritikin Plate  WORKSHOPS   Healthy Mindset (Psychosocial):  Focused Goals, Sustainable Changes Clinical staff led group instruction and group discussion with PowerPoint presentation and patient guidebook. To enhance the learning environment the use of posters, models and videos may be added. Patients will be able to apply effective goal setting strategies to establish at least one personal goal, and then take consistent, meaningful action toward that goal. They will  learn to identify common barriers to achieving personal goals and develop strategies to overcome them. Patients will also gain an understanding of how our mind-set can impact our ability to achieve goals and the importance of cultivating a positive and growth-oriented mind-set. The purpose of this lesson is to provide patients with a deeper understanding of how to set and achieve personal goals, as well as the tools and strategies needed to overcome common obstacles which may arise along the way.  From Head to Heart: The Power of a Healthy Outlook  Clinical staff led  group instruction and group discussion with PowerPoint presentation and patient guidebook. To enhance the learning environment the use of posters, models and videos may be added. Patients will be able to recognize and describe the impact of emotions and mood on physical health. They will discover the importance of self-care and explore self-care practices which may work for them. Patients will also learn how to utilize the 4 C's to cultivate a healthier outlook and better manage stress and challenges. The purpose of this lesson is to demonstrate to patients how a healthy outlook is an essential part of maintaining good health, especially as they continue their cardiac rehab journey.  Healthy Sleep for a Healthy Heart Clinical staff led group instruction and group discussion with PowerPoint presentation and patient guidebook. To enhance the learning environment the use of posters, models and videos may be added. At the conclusion of this workshop, patients will be able to demonstrate knowledge of the importance of sleep to overall health, well-being, and quality of life. They will understand the symptoms of, and treatments for, common sleep disorders. Patients will also be able to identify daytime and nighttime behaviors which impact sleep, and they will be able to apply these tools to help manage sleep-related challenges. The purpose of this  lesson is to provide patients with a general overview of sleep and outline the importance of quality sleep. Patients will learn about a few of the most common sleep disorders. Patients will also be introduced to the concept of "sleep hygiene," and discover ways to self-manage certain sleeping problems through simple daily behavior changes. Finally, the workshop will motivate patients by clarifying the links between quality sleep and their goals of heart-healthy living.   Recognizing and Reducing Stress Clinical staff led group instruction and group discussion with PowerPoint presentation and patient guidebook. To enhance the learning environment the use of posters, models and videos may be added. At the conclusion of this workshop, patients will be able to understand the types of stress reactions, differentiate between acute and chronic stress, and recognize the impact that chronic stress has on their health. They will also be able to apply different coping mechanisms, such as reframing negative self-talk. Patients will have the opportunity to practice a variety of stress management techniques, such as deep abdominal breathing, progressive muscle relaxation, and/or guided imagery.  The purpose of this lesson is to educate patients on the role of stress in their lives and to provide healthy techniques for coping with it.  Learning Barriers/Preferences:  Learning Barriers/Preferences - 12/10/22 1409       Learning Barriers/Preferences   Learning Barriers Sight   reading glasses   Learning Preferences Audio;Computer/Internet;Group Instruction;Pictoral;Individual Instruction;Skilled Demonstration;Verbal Instruction;Video;Written Material             Education Topics:  Knowledge Questionnaire Score:  Knowledge Questionnaire Score - 12/10/22 1407       Knowledge Questionnaire Score   Pre Score 18/24             Core Components/Risk Factors/Patient Goals at Admission:  Personal Goals and  Risk Factors at Admission - 12/10/22 1403       Core Components/Risk Factors/Patient Goals on Admission    Weight Management Yes    Intervention Weight Management: Develop a combined nutrition and exercise program designed to reach desired caloric intake, while maintaining appropriate intake of nutrient and fiber, sodium and fats, and appropriate energy expenditure required for the weight goal.;Weight Management: Provide education and appropriate resources to help participant work on and attain  dietary goals.    Expected Outcomes Short Term: Continue to assess and modify interventions until short term weight is achieved;Long Term: Adherence to nutrition and physical activity/exercise program aimed toward attainment of established weight goal;Understanding recommendations for meals to include 15-35% energy as protein, 25-35% energy from fat, 35-60% energy from carbohydrates, less than 200mg  of dietary cholesterol, 20-35 gm of total fiber daily;Understanding of distribution of calorie intake throughout the day with the consumption of 4-5 meals/snacks    Diabetes Yes    Intervention Provide education about signs/symptoms and action to take for hypo/hyperglycemia.;Provide education about proper nutrition, including hydration, and aerobic/resistive exercise prescription along with prescribed medications to achieve blood glucose in normal ranges: Fasting glucose 65-99 mg/dL    Expected Outcomes Short Term: Participant verbalizes understanding of the signs/symptoms and immediate care of hyper/hypoglycemia, proper foot care and importance of medication, aerobic/resistive exercise and nutrition plan for blood glucose control.;Long Term: Attainment of HbA1C < 7%.    Heart Failure Yes    Intervention Provide a combined exercise and nutrition program that is supplemented with education, support and counseling about heart failure. Directed toward relieving symptoms such as shortness of breath, decreased exercise  tolerance, and extremity edema.    Expected Outcomes Improve functional capacity of life;Short term: Attendance in program 2-3 days a week with increased exercise capacity. Reported lower sodium intake. Reported increased fruit and vegetable intake. Reports medication compliance.;Short term: Daily weights obtained and reported for increase. Utilizing diuretic protocols set by physician.;Long term: Adoption of self-care skills and reduction of barriers for early signs and symptoms recognition and intervention leading to self-care maintenance.    Hypertension Yes    Intervention Monitor prescription use compliance.;Provide education on lifestyle modifcations including regular physical activity/exercise, weight management, moderate sodium restriction and increased consumption of fresh fruit, vegetables, and low fat dairy, alcohol moderation, and smoking cessation.    Expected Outcomes Short Term: Continued assessment and intervention until BP is < 140/50mm HG in hypertensive participants. < 130/69mm HG in hypertensive participants with diabetes, heart failure or chronic kidney disease.;Long Term: Maintenance of blood pressure at goal levels.    Lipids Yes    Intervention Provide education and support for participant on nutrition & aerobic/resistive exercise along with prescribed medications to achieve LDL 70mg , HDL >40mg .    Expected Outcomes Short Term: Participant states understanding of desired cholesterol values and is compliant with medications prescribed. Participant is following exercise prescription and nutrition guidelines.;Long Term: Cholesterol controlled with medications as prescribed, with individualized exercise RX and with personalized nutrition plan. Value goals: LDL < 70mg , HDL > 40 mg.             Core Components/Risk Factors/Patient Goals Review:   Goals and Risk Factor Review     Row Name 12/20/22 0856 01/01/23 1314 01/23/23 1705         Core Components/Risk Factors/Patient  Goals Review   Personal Goals Review Weight Management/Obesity;Lipids;Diabetes;Heart Failure;Hypertension Weight Management/Obesity;Lipids;Diabetes;Heart Failure;Hypertension Weight Management/Obesity;Lipids;Diabetes;Heart Failure;Hypertension     Review Maxence  started cardiac rehab on 12/19/22. Gearald did fair with exercise for his fitness level. Dimaggio was hypotensive post exercise. Asymptomatic. Given water. CBG's were WNL. Mykel  started cardiac rehab on 12/19/22. Evans is off to a good start to  exercise Randie is doing well with exercise at  cardiac rehab. vital signs have been stable     Expected Outcomes Dakari will continue to participate in cardiac rehab for exercise, nutrition and lifestyle modifications. Neville will continue to participate in cardiac rehab  for exercise, nutrition and lifestyle modifications. Durrell will continue to participate in cardiac rehab for exercise, nutrition and lifestyle modifications.              Core Components/Risk Factors/Patient Goals at Discharge (Final Review):   Goals and Risk Factor Review - 01/23/23 1705       Core Components/Risk Factors/Patient Goals Review   Personal Goals Review Weight Management/Obesity;Lipids;Diabetes;Heart Failure;Hypertension    Review Stu is doing well with exercise at  cardiac rehab. vital signs have been stable    Expected Outcomes Juandaniel will continue to participate in cardiac rehab for exercise, nutrition and lifestyle modifications.             ITP Comments:  ITP Comments     Row Name 12/10/22 1327 12/20/22 0843 01/01/23 1309 01/23/23 1704     ITP Comments Dr. Armanda Magic medical director. Introduction to pritikin education/ intensvie cardiac rehab. Initial orientation packet reviewed with patient. 30 Day ITP Review. Donal started cardiac rehab on 12/19/22. Justice did fair with exercise for his fitness level as Jemelle is somewhat deconditioned. 30 Day ITP Review. Donal started cardiac rehab on  12/19/22. Encarnacion is off to a good start to  exercise 30 Day ITP Review. Presten has good attendance and participation in  cardiac rehab             Comments: See ITP comments.Thayer Headings RN BSN

## 2023-01-24 ENCOUNTER — Other Ambulatory Visit: Payer: Self-pay

## 2023-01-25 ENCOUNTER — Encounter (HOSPITAL_COMMUNITY): Payer: Medicare HMO

## 2023-01-25 ENCOUNTER — Encounter (HOSPITAL_COMMUNITY)
Admission: RE | Admit: 2023-01-25 | Discharge: 2023-01-25 | Disposition: A | Discharge status: Home / Self Care | Payer: Medicare HMO | Source: Ambulatory Visit | Attending: Cardiology | Admitting: Cardiology

## 2023-01-25 DIAGNOSIS — I5022 Chronic systolic (congestive) heart failure: Secondary | ICD-10-CM | POA: Diagnosis not present

## 2023-01-25 DIAGNOSIS — Z5189 Encounter for other specified aftercare: Secondary | ICD-10-CM | POA: Diagnosis not present

## 2023-01-28 ENCOUNTER — Encounter (HOSPITAL_COMMUNITY): Payer: Medicare HMO

## 2023-01-28 ENCOUNTER — Encounter (HOSPITAL_COMMUNITY)
Admission: RE | Admit: 2023-01-28 | Discharge: 2023-01-28 | Disposition: A | Discharge status: Home / Self Care | Payer: Medicare HMO | Source: Ambulatory Visit | Attending: Cardiology

## 2023-01-28 DIAGNOSIS — Z5189 Encounter for other specified aftercare: Secondary | ICD-10-CM | POA: Diagnosis not present

## 2023-01-28 DIAGNOSIS — I5022 Chronic systolic (congestive) heart failure: Secondary | ICD-10-CM | POA: Diagnosis not present

## 2023-01-30 ENCOUNTER — Encounter (HOSPITAL_COMMUNITY)
Admission: RE | Admit: 2023-01-30 | Discharge: 2023-01-30 | Disposition: A | Discharge status: Home / Self Care | Payer: Medicare HMO | Source: Ambulatory Visit | Attending: Cardiology | Admitting: Cardiology

## 2023-01-30 ENCOUNTER — Encounter (HOSPITAL_COMMUNITY): Payer: Medicare HMO

## 2023-01-30 DIAGNOSIS — I5022 Chronic systolic (congestive) heart failure: Secondary | ICD-10-CM

## 2023-01-30 DIAGNOSIS — Z5189 Encounter for other specified aftercare: Secondary | ICD-10-CM | POA: Diagnosis not present

## 2023-02-01 ENCOUNTER — Other Ambulatory Visit (HOSPITAL_COMMUNITY): Payer: Self-pay

## 2023-02-01 ENCOUNTER — Ambulatory Visit (HOSPITAL_COMMUNITY)
Admission: RE | Admit: 2023-02-01 | Discharge: 2023-02-01 | Disposition: A | Discharge status: Home / Self Care | Payer: Medicare HMO | Source: Ambulatory Visit | Attending: Family Medicine | Admitting: Family Medicine

## 2023-02-01 ENCOUNTER — Encounter (HOSPITAL_COMMUNITY): Payer: Self-pay | Admitting: Cardiology

## 2023-02-01 ENCOUNTER — Ambulatory Visit (HOSPITAL_COMMUNITY)
Admission: RE | Admit: 2023-02-01 | Discharge: 2023-02-01 | Disposition: A | Discharge status: Home / Self Care | Payer: Medicare HMO | Source: Ambulatory Visit | Attending: Cardiology | Admitting: Cardiology

## 2023-02-01 ENCOUNTER — Other Ambulatory Visit (HOSPITAL_COMMUNITY): Payer: Self-pay | Admitting: *Deleted

## 2023-02-01 ENCOUNTER — Encounter (HOSPITAL_COMMUNITY)
Admission: RE | Admit: 2023-02-01 | Discharge: 2023-02-01 | Disposition: A | Discharge status: Home / Self Care | Payer: Medicare HMO | Source: Ambulatory Visit | Attending: Cardiology | Admitting: Cardiology

## 2023-02-01 ENCOUNTER — Encounter (HOSPITAL_COMMUNITY): Payer: Medicare HMO

## 2023-02-01 VITALS — BP 94/60 | HR 65 | Wt 177.2 lb

## 2023-02-01 DIAGNOSIS — I4821 Permanent atrial fibrillation: Secondary | ICD-10-CM | POA: Diagnosis not present

## 2023-02-01 DIAGNOSIS — I5023 Acute on chronic systolic (congestive) heart failure: Secondary | ICD-10-CM | POA: Diagnosis not present

## 2023-02-01 DIAGNOSIS — I272 Pulmonary hypertension, unspecified: Secondary | ICD-10-CM | POA: Diagnosis not present

## 2023-02-01 DIAGNOSIS — E119 Type 2 diabetes mellitus without complications: Secondary | ICD-10-CM | POA: Insufficient documentation

## 2023-02-01 DIAGNOSIS — Z7984 Long term (current) use of oral hypoglycemic drugs: Secondary | ICD-10-CM | POA: Insufficient documentation

## 2023-02-01 DIAGNOSIS — Z8546 Personal history of malignant neoplasm of prostate: Secondary | ICD-10-CM | POA: Diagnosis not present

## 2023-02-01 DIAGNOSIS — I447 Left bundle-branch block, unspecified: Secondary | ICD-10-CM | POA: Diagnosis not present

## 2023-02-01 DIAGNOSIS — I110 Hypertensive heart disease with heart failure: Secondary | ICD-10-CM | POA: Insufficient documentation

## 2023-02-01 DIAGNOSIS — R600 Localized edema: Secondary | ICD-10-CM | POA: Diagnosis present

## 2023-02-01 DIAGNOSIS — Z95810 Presence of automatic (implantable) cardiac defibrillator: Secondary | ICD-10-CM | POA: Diagnosis not present

## 2023-02-01 DIAGNOSIS — I428 Other cardiomyopathies: Secondary | ICD-10-CM | POA: Insufficient documentation

## 2023-02-01 DIAGNOSIS — Z79899 Other long term (current) drug therapy: Secondary | ICD-10-CM | POA: Insufficient documentation

## 2023-02-01 DIAGNOSIS — I340 Nonrheumatic mitral (valve) insufficiency: Secondary | ICD-10-CM | POA: Insufficient documentation

## 2023-02-01 DIAGNOSIS — Z7901 Long term (current) use of anticoagulants: Secondary | ICD-10-CM | POA: Diagnosis not present

## 2023-02-01 DIAGNOSIS — I5022 Chronic systolic (congestive) heart failure: Secondary | ICD-10-CM | POA: Insufficient documentation

## 2023-02-01 DIAGNOSIS — I5043 Acute on chronic combined systolic (congestive) and diastolic (congestive) heart failure: Secondary | ICD-10-CM | POA: Diagnosis present

## 2023-02-01 DIAGNOSIS — R9431 Abnormal electrocardiogram [ECG] [EKG]: Secondary | ICD-10-CM | POA: Diagnosis not present

## 2023-02-01 DIAGNOSIS — E785 Hyperlipidemia, unspecified: Secondary | ICD-10-CM | POA: Diagnosis not present

## 2023-02-01 DIAGNOSIS — I2510 Atherosclerotic heart disease of native coronary artery without angina pectoris: Secondary | ICD-10-CM | POA: Diagnosis not present

## 2023-02-01 DIAGNOSIS — Z85028 Personal history of other malignant neoplasm of stomach: Secondary | ICD-10-CM | POA: Insufficient documentation

## 2023-02-01 DIAGNOSIS — Z5189 Encounter for other specified aftercare: Secondary | ICD-10-CM | POA: Diagnosis not present

## 2023-02-01 LAB — BASIC METABOLIC PANEL
Anion gap: 11 (ref 5–15)
BUN: 28 mg/dL — ABNORMAL HIGH (ref 8–23)
CO2: 22 mmol/L (ref 22–32)
Calcium: 9.4 mg/dL (ref 8.9–10.3)
Chloride: 101 mmol/L (ref 98–111)
Creatinine, Ser: 1.26 mg/dL — ABNORMAL HIGH (ref 0.61–1.24)
GFR, Estimated: 60 mL/min — ABNORMAL LOW (ref >60)
Glucose, Bld: 106 mg/dL — ABNORMAL HIGH (ref 70–99)
Potassium: 4.5 mmol/L (ref 3.5–5.1)
Sodium: 134 mmol/L — ABNORMAL LOW (ref 135–145)

## 2023-02-01 LAB — ECHOCARDIOGRAM COMPLETE
Area-P 1/2: 2.78 cm2
Calc EF: 20.6 %
Est EF: 20
S' Lateral: 6.1 cm
Single Plane A2C EF: 21.5 %
Single Plane A4C EF: 23.1 %

## 2023-02-01 LAB — CBC
HCT: 36.5 % — ABNORMAL LOW (ref 39.0–52.0)
Hemoglobin: 11.9 g/dL — ABNORMAL LOW (ref 13.0–17.0)
MCH: 30.4 pg (ref 26.0–34.0)
MCHC: 32.6 g/dL (ref 30.0–36.0)
MCV: 93.4 fL (ref 80.0–100.0)
Platelets: 277 10*3/uL (ref 150–400)
RBC: 3.91 MIL/uL — ABNORMAL LOW (ref 4.22–5.81)
RDW: 13 % (ref 11.5–15.5)
WBC: 7.7 10*3/uL (ref 4.0–10.5)
nRBC: 0 % (ref 0.0–0.2)

## 2023-02-01 LAB — DIGOXIN LEVEL: Digoxin Level: 0.5 ng/mL — ABNORMAL LOW (ref 0.8–2.0)

## 2023-02-01 LAB — BRAIN NATRIURETIC PEPTIDE: B Natriuretic Peptide: 591.5 pg/mL — ABNORMAL HIGH (ref 0.0–100.0)

## 2023-02-01 MED ORDER — METOPROLOL SUCCINATE ER 25 MG PO TB24
25.0000 mg | ORAL_TABLET | Freq: Two times a day (BID) | ORAL | 3 refills | Status: DC
  Filled 2023-02-01: qty 180, 90d supply, fill #0

## 2023-02-01 NOTE — Progress Notes (Signed)
ITAMAR home sleep study given to patient, all instructions explained, waiver signed, and CLOUDPAT registration complete.  

## 2023-02-01 NOTE — Progress Notes (Signed)
Height:     Weight: BMI:  Today's Date:  STOP BANG RISK ASSESSMENT S (snore) Have you been told that you snore?     YES/  T (tired) Are you often tired, fatigued, or sleepy during the day?   YES/  O (obstruction) Do you stop breathing, choke, or gasp during sleep? /NO   P (pressure) Do you have or are you being treated for high blood pressure? YES/   B (BMI) Is your body index greater than 35 kg/m? NO   A (age) Are you 74 years old or older? YES/  N (neck) Do you have a neck circumference greater than 16 inches?   YES/NO   G (gender) Are you a male? YES/   TOTAL STOP/BANG "YES" ANSWERS 4                                                                       For Office Use Only              Procedure Order Form    YES to 3+ Stop Bang questions OR two clinical symptoms - patient qualifies for WatchPAT (CPT 95800)      Clinical Notes: Will consult Sleep Specialist and refer for management of therapy due to patient increased risk of Sleep Apnea. Ordering a sleep study due to the following two clinical symptoms: Excessive daytime sleepiness G47.10 / G44.221 / Difficulty concentrating R41.840 / Memory problems or poor judgment G31.84   / Loud snoring R06.83 / Depression F32.9 / Unrefreshed by sleep G47.8 / Impotence N52.9 / History of high blood pressure

## 2023-02-01 NOTE — Patient Instructions (Addendum)
Increase Toprol XL to 25 mg twice daily - Rx sent to local pharmacy. Labs today - will call you if abnormal.  Right heart cath ordered and scheduled - see below. Home sleep study ordered - see below. Return to [facility] in 6 weeks - see below.  Please call us at [phone] if any questions or     You are scheduled for a Cardiac Catheterization on Thursday, January 23 with Dr. Marca Ancona.  1. Please arrive at the [facility] (Main Entrance A) at [facility]: [address] [place], [place] [address] at 5:30 AM (This time is 2 hour(s) before your procedure to ensure your preparation).   Free valet parking service is available. You will check in at ADMITTING. The support person will be asked to wait in the waiting room.  It is OK to have someone drop you off and come back when you are ready to be discharged.    Special note: Every effort is made to have your procedure done on time. Please understand that emergencies sometimes delay scheduled procedures.  2. Diet: Do not eat solid foods after midnight.  The patient may have clear liquids until 5am upon the day of the procedure.  3. Labs: done today  4. Medication instructions in preparation for your procedure:   Contrast Allergy: No  HOLD XARELTO DAY BEFORE AND DAY OF HOLD day of procedure Jardiance Metformin lasix spironolactone   On the morning of your procedure, take any morning medicines NOT listed above.  You may use sips of water.  5. Plan to go home the same day, you will only stay overnight if medically necessary. 6. Bring a current list of your medications and current insurance cards. 7. You MUST have a responsible person to drive you home. 8. Someone MUST be with you the first 24 hours after you arrive home or your discharge will be delayed. 9. Please wear clothes that are easy to get on and off and wear slip-on shoes.  Thank you for allowing [place] to care for you!   -- [facility]  If you have any questions or concerns before your next appointment please send [place] a message through [facility] or call our office at [phone].    TO LEAVE A MESSAGE FOR THE NURSE SELECT OPTION 2, PLEASE LEAVE A MESSAGE INCLUDING: YOUR NAME DATE OF BIRTH CALL BACK NUMBER REASON FOR CALL**this is important as we prioritize the call backs  YOU WILL RECEIVE A CALL BACK THE SAME DAY AS LONG AS YOU CALL BEFORE 4:00 PM At the [facility], you and your health needs are our priority. As part of our continuing mission to provide you with exceptional heart care, we have created designated Provider Care Teams. These Care Teams include your primary Cardiologist (physician) and Advanced Practice Providers (APPs- Physician Assistants and Nurse Practitioners) who all work together to provide you with the care you need, when you need it.   You may see any of the following providers on your designated Care Team at your next follow up: Dr Arvilla Meres Dr Marca Ancona Dr. Dorthula Nettles Dr. Clearnce Hasten Amy Filbert Schilder, NP Robbie Lis, [place] [facility] [place], [place] Brynda Peon, NP [place] Lee, NP Karle Plumber, PharmD   Please be sure to bring in all your medications bottles to every appointment.    Thank you for choosing  [facility]

## 2023-02-02 NOTE — Progress Notes (Signed)
ADVANCED HF CLINIC CONSULT NOTE   Primary Care: Jon Dills, MD Primary Cardiologist: Jon Miss, MD HF Cardiologist: Dr. Shirlee Williams  HPI: [name] is a 72 y.o. with a history of chronic systolic heart failure, St Jude ICD, permanent atrial fibrillation, CAD, DMII, LBBB, HTN, gastric cancer 2022, and prostate cancer 2019.    Cath 2018 showed mild to moderate non-obstructive CAD. Fick CI 1.9.    Echo 10/22 showed EF 20-25%, mod-severe MR, mod TR, RV nl   Echo 2/23 showed EF 20-25%, mild MR, RV not well visualized   Admitted 8/23 with HF. Diuresed with IV lasix and GDMT adjusted. Echo showed EF 20-25%, RV mildly reduced, LA severely dilated, moderate-severe MR.    Had follow up with Dr Jon Williams 09/2022. CT scan no evidence of recurrent cancer. Plan for repeat CT in 8 months.    Admitted 9/24 with CHF. Echo showed EF 10-15%, G3DD, RV mildly reduced. R/LHC showed nonobstructive CAD, normal filling pressures, and low cardiac index. Unable to get cMRI as device not compatible. GDMT titrated and he was discharged home, weight 183 lbs.  Echo was done today and reviewed, EF 20% with severe LV dilation, normal RV, severe LAE, moderate MR, IVC normal.   He returns for followup of CHF.  Main complaint is neuropathic pain in hands and feet that he attributes to prior chemotherapy as well as some trouble with balance (no falls). He works out for 30 minutes on an elliptical without significant exertional dyspnea. He can get around the grocery store without problems.  He does get fatigued carrying groceries into his house.  No orthopnea/PND.  No problems with ADLs.  He is doing cardiac rehab. Weight down 1 lb. He reports significant daytime sleepiness.   ECG (personally reviewed): Atrial fibrillation with BiV pacing  St Jude device interrogation (personally reviewed): CorVue stable, no VT  ReDs: 32%  Labs (9/24): K 3.7, creatinine 1.13, Lp(a) 184 Labs (10/24): K 4.3, creatinine 1.27, hgb  13.6  PMH: 1. H/o gastric cancer.  2. H/o prostate cancer.  3. Type 2 DM 4. Atrial fibrillation: Permanent 5. Chronic systolic CHF: Nonischemic cardiomyopathy. St Jude CRT-D.  - R/LHC (2018): Mild to moderate non obstructive CAD; RA 8, PA 42/20 (29), PCWP 17, CO/CI (Fick) 3.76/ 1.9  - Echo (10/22): EF 20-25%, mod-severe MR, mod TR, RV normal - Echo (2/23): EF 20-25%, mild MR, RV not well visualized - Echo (8/24): EF 20-25%, RV mildly reduced, LA severely dilated, moderate to severe MR - R/LHC (9/24): non obstructive CAD; RA 1, PA 28/10 (17), PCWP 8, Fick CI 2.16, thermo CI 1.84.  - Echo (9/24): EF 10-15%, severe LV dilation, mild RV dysfunction with moderate RV enlargement, moderate-severe MR.  - Echo (12/24): EF 20% with severe LV dilation, normal RV, severe LAE, moderate MR, IVC normal.   FH: mother CVA, MI  Review of Systems: All systems reviewed and negative except as per HPI.   Current Outpatient Medications  Medication Sig Dispense Refill   atorvastatin (LIPITOR) 40 MG tablet Take 1 tablet (40 mg total) by mouth daily. 90 tablet 1   Biotin 1000 MCG tablet Take 1,000 mcg by mouth daily with breakfast.     cholecalciferol (VITAMIN D3) 25 MCG (1000 UT) tablet Take 1,000 Units by mouth daily with breakfast.     digoxin (LANOXIN) 0.125 MG tablet Take 0.5 tablets (0.0625 mg total) by mouth daily. 15 tablet 3   empagliflozin (JARDIANCE) 10 MG TABS tablet Take 1 tablet (10 mg total)  by mouth daily. 90 tablet 3   furosemide (LASIX) 40 MG tablet Take 1 tablet (40 mg total) by mouth daily. 30 tablet 6   gabapentin (NEURONTIN) 100 MG capsule Take 1 capsule (100 mg total) by mouth 2 (two) times daily. 180 capsule 1   ketoconazole (NIZORAL) 2 % cream Apply 1 Application topically 2 (two) times daily. 30 g 3   loratadine (CLARITIN) 10 MG tablet Take 10 mg by mouth daily.     metFORMIN (GLUCOPHAGE-XR) 500 MG 24 hr tablet Take 1 tablet (500 mg total) by mouth with evening meal 90 tablet 3    multivitamin (ONE-A-DAY MEN'S) TABS tablet Take 1 tablet by mouth daily with breakfast.     Naftifine HCl (NAFTIN) 2 % GEL Apply 1 application  topically every Monday, Wednesday, and Friday.     pantoprazole (PROTONIX) 40 MG tablet Take 1 tablet (40 mg total) by mouth 2 (two) times daily. 180 tablet 3   potassium chloride (KLOR-CON) 10 MEQ tablet Take 2 tablets (20 mEq total) by mouth daily. 60 tablet 3   sacubitril-valsartan (ENTRESTO) 24-26 MG Take 1 tablet by mouth 2 (two) times daily. 180 tablet 1   spironolactone (ALDACTONE) 25 MG tablet Take 1 tablet (25 mg total) by mouth daily. 90 tablet 1   Tavaborole 5 % SOLN Apply 1 application  topically daily. Nail on left hand     XARELTO 20 MG TABS tablet TAKE 1 TABLET BY MOUTH EVERY DAY WITH DINNER 90 tablet 1   metoprolol succinate (TOPROL-XL) 25 MG 24 hr tablet Take 1 tablet (25 mg total) by mouth in the morning and at bedtime. 180 tablet 3   No current facility-administered medications for this encounter.   Allergies  Allergen Reactions   Aspirin Anaphylaxis and Hives   Sulfa Antibiotics Anaphylaxis, Hives, Swelling and Other (See Comments)    Swollen lips   Social History   Socioeconomic History   Marital status: Married    Spouse name: Not on file   Number of children: 2   Years of education: Not on file   Highest education level: Not on file  Occupational History   Occupation: retired  Tobacco Use   Smoking status: Never   Smokeless tobacco: Never  Vaping Use   Vaping status: Never Used  Substance and Sexual Activity   Alcohol use: No   Drug use: No   Sexual activity: Not Currently  Other Topics Concern   Not on file  Social History Narrative   Retired Location manager. Married to Jon Williams. Daughter, Jon Williams, lives in [place]. Son, Jon Williams, lives in [place].   Social Drivers of Corporate investment banker Strain: Low Risk  (12/26/2021)   Overall Financial Resource Strain (CARDIA)    Difficulty of Paying  Living Expenses: Not very hard  Food Insecurity: No Food Insecurity (10/29/2022)   Hunger Vital Sign    Worried About Running Out of Food in the Last Year: Never true    Ran Out of Food in the Last Year: Never true  Transportation Needs: No Transportation Needs (10/29/2022)   PRAPARE - Administrator, Civil Service (Medical): No    Lack of Transportation (Non-Medical): No  Physical Activity: Insufficiently Active (09/26/2021)   Exercise Vital Sign    Days of Exercise per Week: 7 days    Minutes of Exercise per Session: 10 min  Stress: No Stress Concern Present (09/26/2021)   [employer] of Occupational Health - Occupational Stress Questionnaire  Feeling of Stress : Not at all  Social Connections: Not on file  Intimate Partner Violence: Not At Risk (10/29/2022)   Humiliation, Afraid, Rape, and Kick questionnaire    Fear of Current or Ex-Partner: No    Emotionally Abused: No    Physically Abused: No    Sexually Abused: No    BP 94/60   Pulse 65   Wt 80.4 kg (177 lb 3.2 oz)   SpO2 98%   BMI 28.60 kg/m   Wt Readings from Last 3 Encounters:  02/01/23 80.4 kg (177 lb 3.2 oz)  12/10/22 81.7 kg (180 lb 1.9 oz)  12/04/22 81.4 kg (179 lb 6.4 oz)   PHYSICAL EXAM: General: NAD Neck: No JVD, no thyromegaly or thyroid nodule.  Lungs: Clear to auscultation bilaterally with normal respiratory effort. CV: Nondisplaced PMI.  Heart regular S1/S2, no S3/S4, no murmur.  No peripheral edema.  No carotid bruit.  Normal pedal pulses.  Abdomen: Soft, nontender, no hepatosplenomegaly, no distention.  Skin: Intact without lesions or rashes.  Neurologic: Alert and oriented x 3.  Psych: Normal affect. Extremities: No clubbing or cyanosis.  HEENT: Normal.   ASSESSMENT & PLAN: 1. Chronic systolic CHF: Long-standing cardiomyopathy, nonischemic.  St Jude CRT-D device. Device is not MRI compatible. Cath in 2018 with nonobstructive CAD but CI at that time was low at 1.9.  Echo in 8/23  showed EF 20-25%, mild RV dysfunction, moderate-severe MR.  Admitted 9/24 with CHF. Echo showed EF 10-15%, G3DD, RV mildly reduced. R/LHC showed non-obstructive CAD, normal filling pressures and low CI (2.16 Fick/1.84 thermo). Echo done today showed EF 20% with severe LV dilation, normal RV, severe LAE, moderate MR, IVC normal. Despite history of low output HF, he actually reports minimal symptoms (NYHA class II) and is not volume overloaded on exam or by Corvue.  - Based on low output on prior RHC and markedly low EF, would consider him for LVAD.  We discussed this today.  However, per his report he is minimally symptomatic.  I am going to repeat RHC to see if hemodynamics have improved with aggressive medical regimen.  We discussed risks/benefits and he agrees to the procedure.  I would also like to get a CPX for objective measurement of his functional capacity but unable to get this currently as we do not have an exercise tech.  - Continue digoxin. Check digoxin level today. - Continue Lasix 40 mg daily. BMET and BNP today. - Continue Entresto 24/26 mg bid, no BP room to increase.  - Continue spironolactone 25 mg daily - Continue Jardiance 10 daily - Increase Toprol XL to 25 mg bid.   - Continue cardiac rehab.  2. Atrial fibrillation: This is permanent.   - Continue Xarelto.   3. Gastric cancer: Treated, now being monitored.  4. Type 2 diabetes: Per PCP.  5. OSA: Suspected. I will try to arrange for home sleep study.   Follow up in 6 wks with APP.   Marca Ancona 02/02/23

## 2023-02-04 ENCOUNTER — Encounter (HOSPITAL_COMMUNITY): Payer: Medicare HMO

## 2023-02-04 ENCOUNTER — Encounter (HOSPITAL_COMMUNITY)
Admission: RE | Admit: 2023-02-04 | Discharge: 2023-02-04 | Disposition: A | Discharge status: Home / Self Care | Payer: Medicare HMO | Source: Ambulatory Visit | Attending: Cardiology

## 2023-02-04 DIAGNOSIS — Z5189 Encounter for other specified aftercare: Secondary | ICD-10-CM | POA: Diagnosis not present

## 2023-02-04 DIAGNOSIS — I5022 Chronic systolic (congestive) heart failure: Secondary | ICD-10-CM | POA: Diagnosis not present

## 2023-02-05 ENCOUNTER — Telehealth (HOSPITAL_COMMUNITY): Payer: Self-pay

## 2023-02-05 NOTE — Telephone Encounter (Signed)
Patient brought back Jon Williams device and wants heart catheterization cancelled Called patient to see if there is anything we could do. Patient reports a lot going on and doesn't want to do testing at this time. He is keeping follow up appointment on January 31,2025

## 2023-02-07 ENCOUNTER — Other Ambulatory Visit (HOSPITAL_COMMUNITY): Payer: Self-pay

## 2023-02-08 ENCOUNTER — Encounter (HOSPITAL_COMMUNITY): Payer: Medicare HMO

## 2023-02-08 ENCOUNTER — Encounter (HOSPITAL_COMMUNITY)
Admission: RE | Admit: 2023-02-08 | Discharge: 2023-02-08 | Disposition: A | Discharge status: Home / Self Care | Payer: Medicare HMO | Source: Ambulatory Visit | Attending: Cardiology | Admitting: Cardiology

## 2023-02-08 DIAGNOSIS — I5022 Chronic systolic (congestive) heart failure: Secondary | ICD-10-CM

## 2023-02-08 DIAGNOSIS — Z5189 Encounter for other specified aftercare: Secondary | ICD-10-CM | POA: Diagnosis not present

## 2023-02-11 ENCOUNTER — Encounter (HOSPITAL_COMMUNITY): Payer: Medicare HMO

## 2023-02-11 ENCOUNTER — Encounter (HOSPITAL_COMMUNITY)
Admission: RE | Admit: 2023-02-11 | Discharge: 2023-02-11 | Disposition: A | Discharge status: Home / Self Care | Payer: Medicare HMO | Source: Ambulatory Visit | Attending: Cardiology

## 2023-02-11 DIAGNOSIS — I5022 Chronic systolic (congestive) heart failure: Secondary | ICD-10-CM | POA: Diagnosis not present

## 2023-02-11 DIAGNOSIS — Z5189 Encounter for other specified aftercare: Secondary | ICD-10-CM | POA: Diagnosis not present

## 2023-02-12 ENCOUNTER — Encounter: Payer: Self-pay | Admitting: Cardiology

## 2023-02-15 ENCOUNTER — Encounter (HOSPITAL_COMMUNITY)
Admission: RE | Admit: 2023-02-15 | Discharge: 2023-02-15 | Disposition: A | Discharge status: Home / Self Care | Payer: Medicare HMO | Source: Ambulatory Visit | Attending: Cardiology | Admitting: Cardiology

## 2023-02-15 ENCOUNTER — Encounter (HOSPITAL_COMMUNITY): Payer: Medicare HMO

## 2023-02-15 DIAGNOSIS — Z5189 Encounter for other specified aftercare: Secondary | ICD-10-CM | POA: Insufficient documentation

## 2023-02-15 DIAGNOSIS — C169 Malignant neoplasm of stomach, unspecified: Secondary | ICD-10-CM | POA: Insufficient documentation

## 2023-02-15 DIAGNOSIS — C61 Malignant neoplasm of prostate: Secondary | ICD-10-CM | POA: Diagnosis not present

## 2023-02-15 DIAGNOSIS — I130 Hypertensive heart and chronic kidney disease with heart failure and stage 1 through stage 4 chronic kidney disease, or unspecified chronic kidney disease: Secondary | ICD-10-CM | POA: Diagnosis not present

## 2023-02-15 DIAGNOSIS — I5022 Chronic systolic (congestive) heart failure: Secondary | ICD-10-CM | POA: Diagnosis not present

## 2023-02-15 DIAGNOSIS — B351 Tinea unguium: Secondary | ICD-10-CM | POA: Diagnosis not present

## 2023-02-15 DIAGNOSIS — L603 Nail dystrophy: Secondary | ICD-10-CM | POA: Diagnosis not present

## 2023-02-15 NOTE — Progress Notes (Signed)
 Reviewed home exercise Rx with patient today.  Encouraged warm-up, cool-down, and stretching. Reviewed THRR of 58-117 and keeping RPE between 11-13. Encouraged to hydrate with activity.  Reviewed weather parameters for temperature and humidity for safe exercise outdoors. Reviewed S/S to terminate exercise and when to call 911 vs MD. Reviewed the use of NTG and pt was encouraged to carry at all times. Pt encouraged to always carry a cell phone for safety when exercising outdoors. Pt also encouraged to know his blood sugar level before beginning exercise and carry glucose tabs if exercising outdoors. Pt verbalized understanding of the home exercise Rx and was provided a copy.   Alm Parkins MS, ACSM-CEP, CCRP

## 2023-02-18 ENCOUNTER — Encounter (HOSPITAL_COMMUNITY): Payer: Medicare HMO

## 2023-02-18 NOTE — Assessment & Plan Note (Signed)
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  S/p FLOT4 01/02/2021 - 03/22/2021 -Posttreatment EUS 05/2021 and 06/29/2022 by Dr. Elnoria Howard showed a scar and residual erythema in stomach, biopsy negative for residual malignancy.  -Mr Juergensen is clinically doing well on surveillance. Last CT 03/20/22 which shows no evidence of recurrent or metastatic disease, and stable bilateral adrenal adenomas -Surveillance CT scan from October 09, 2022 showed no evidence of recurrence -He is clinically doing well overall, will continue monitoring.

## 2023-02-18 NOTE — Progress Notes (Signed)
 Cardiac Individual Treatment Plan  Patient Details  Name: Jon Williams MRN: [id number] Date of Birth: Aug 13, 1950 Referring Provider:   Flowsheet Row INTENSIVE CARDIAC REHAB ORIENT from 12/10/2022 in [facility] for Heart, Vascular, & Lung Health  Referring Provider Ezra Shuck, MD       Initial Encounter Date:  Flowsheet Row INTENSIVE CARDIAC REHAB ORIENT from 12/10/2022 in [facility] for Heart, Vascular, & Lung Health  Date 12/10/22       Visit Diagnosis: Heart failure, chronic systolic (HCC)  Patient's Home Medications on Admission:  Current Outpatient Medications:    atorvastatin  (LIPITOR) 40 MG tablet, Take 1 tablet (40 mg total) by mouth daily., Disp: 90 tablet, Rfl: 1   Biotin 1000 MCG tablet, Take 1,000 mcg by mouth daily with breakfast., Disp: , Rfl:    cholecalciferol  (VITAMIN D3) 25 MCG (1000 UT) tablet, Take 1,000 Units by mouth daily with breakfast., Disp: , Rfl:    digoxin  (LANOXIN ) 0.125 MG tablet, Take 0.5 tablets (0.0625 mg total) by mouth daily., Disp: 15 tablet, Rfl: 3   empagliflozin  (JARDIANCE ) 10 MG TABS tablet, Take 1 tablet (10 mg total) by mouth daily., Disp: 90 tablet, Rfl: 3   furosemide  (LASIX ) 40 MG tablet, Take 1 tablet (40 mg total) by mouth daily., Disp: 30 tablet, Rfl: 6   gabapentin  (NEURONTIN ) 100 MG capsule, Take 1 capsule (100 mg total) by mouth 2 (two) times daily., Disp: 180 capsule, Rfl: 1   ketoconazole  (NIZORAL ) 2 % cream, Apply 1 Application topically 2 (two) times daily., Disp: 30 g, Rfl: 3   loratadine  (CLARITIN ) 10 MG tablet, Take 10 mg by mouth daily., Disp: , Rfl:    metFORMIN  (GLUCOPHAGE -XR) 500 MG 24 hr tablet, Take 1 tablet (500 mg total) by mouth with evening meal, Disp: 90 tablet, Rfl: 3   metoprolol  succinate (TOPROL -XL) 25 MG 24 hr tablet, Take 1 tablet (25 mg total) by mouth in the morning and at bedtime., Disp: 180 tablet, Rfl: 3   multivitamin (ONE-A-DAY MEN'S) TABS tablet,  Take 1 tablet by mouth daily with breakfast., Disp: , Rfl:    Naftifine HCl (NAFTIN) 2 % GEL, Apply 1 application  topically every Monday, Wednesday, and Friday., Disp: , Rfl:    pantoprazole  (PROTONIX ) 40 MG tablet, Take 1 tablet (40 mg total) by mouth 2 (two) times daily., Disp: 180 tablet, Rfl: 3   potassium chloride  (KLOR-CON ) 10 MEQ tablet, Take 2 tablets (20 mEq total) by mouth daily., Disp: 60 tablet, Rfl: 3   sacubitril -valsartan  (ENTRESTO ) 24-26 MG, Take 1 tablet by mouth 2 (two) times daily., Disp: 180 tablet, Rfl: 1   spironolactone  (ALDACTONE ) 25 MG tablet, Take 1 tablet (25 mg total) by mouth daily., Disp: 90 tablet, Rfl: 1   Tavaborole 5 % SOLN, Apply 1 application  topically daily. Nail on left hand, Disp: , Rfl:    XARELTO  20 MG TABS tablet, TAKE 1 TABLET BY MOUTH EVERY DAY WITH DINNER, Disp: 90 tablet, Rfl: 1  Past Medical History: Past Medical History:  Diagnosis Date   AICD (automatic cardioverter/defibrillator) present 05/25/2016   biv icd   Bunion, right    Chronic combined systolic and diastolic CHF (congestive heart failure) (HCC)    Echo 1/18: Mild conc LVH, EF 15-20, severe diff HK, inf and inf-septal AK, Gr 3 DD, mild to mod MR, severe LAE, mod reduced RVSF, mod RAE, mild TR, PASP 50   Coronary artery disease involving native coronary artery without angina pectoris  04/17/2016   LHC 1/18: pLCx 30, mLCx 20, mRCA 40, dRCA 20, LVEDP 23, mean RA 8, PA 42/20, PCWP 17   Diabetes mellitus without complication (HCC)    Gastric cancer (HCC)    History of atrial fibrillation    History of atrial flutter    History of cardiomegaly 06/07/2016   Noted on CXR   History of colon polyps 06/28/2017   Noted on colonoscopy   Hypertension    LBBB (left bundle branch block)    Nausea vomiting and diarrhea 04/04/2021   NICM (nonischemic cardiomyopathy) (HCC)    Echo 1/18:  Mild conc LVH, EF 15-20, severe diff HK, inf and inf-septal AK, Gr 3 DD, mild to mod MR, severe LAE, mod  reduced RVSF, mod RAE, mild TR, PASP 50   Other secondary pulmonary hypertension (HCC) 04/17/2016   Prostate cancer (HCC) 2019   Sigmoid diverticulosis 06/28/2017   Noted on colonoscopy    Tobacco Use: Social History   Tobacco Use  Smoking Status Never  Smokeless Tobacco Never    Labs: Review Flowsheet  More data exists      Latest Ref Rng & Units 06/06/2016 12/01/2020 04/01/2021 12/26/2021 10/30/2022  Labs for ITP Cardiac and Pulmonary Rehab  Cholestrol 100 - 199 mg/dL 867  - - - -  LDL (calc) 0 - 99 mg/dL 61  - - - -  HDL-C >60 mg/dL 60  - - - -  Trlycerides 0 - 149 mg/dL 53  - - - -  Hemoglobin A1c 4.8 - 5.6 % - 6.2  6.3  6.1  5.7   Bicarbonate 20.0 - 28.0 mmol/L 20.0 - 28.0 mmol/L - - 24.5  - 22.2  21.3   TCO2 22 - 32 mmol/L 22 - 32 mmol/L - - - - 23  22   Acid-base deficit 0.0 - 2.0 mmol/L 0.0 - 2.0 mmol/L - - - - 2.0  3.0   O2 Saturation % % - - 73.3  58.1  - 65  60     Details       Multiple values from one day are sorted in reverse-chronological order         Capillary Blood Glucose: Lab Results  Component Value Date   GLUCAP 94 12/21/2022   GLUCAP 101 (H) 12/21/2022   GLUCAP 110 (H) 12/19/2022   GLUCAP 93 12/19/2022   GLUCAP 89 12/17/2022     Exercise Target Goals: Exercise Program Goal: Individual exercise prescription set using results from initial 6 min walk test and THRR while considering  patient's activity barriers and safety.   Exercise Prescription Goal: Initial exercise prescription builds to 30-45 minutes a day of aerobic activity, 2-3 days per week.  Home exercise guidelines will be given to patient during program as part of exercise prescription that the participant will acknowledge.  Activity Barriers & Risk Stratification:   6 Minute Walk:  6 Minute Walk     Row Name 12/10/22 1522         6 Minute Walk   Phase Initial     Distance 1150 feet  used go-cart     Walk Time 6 minutes     # of Rest Breaks 0     MPH 2.18      METS 2.43     RPE 9     Perceived Dyspnea  0     VO2 Peak 8.49     Symptoms Yes (comment)     Comments no pain, legs fatigued  Resting HR 64 bpm     Resting BP 108/70     Resting Oxygen Saturation  98 %     Exercise Oxygen Saturation  during 6 min walk 99 %     Max Ex. HR 109 bpm     Max Ex. BP 134/70     2 Minute Post BP 113/86              Oxygen Initial Assessment:   Oxygen Re-Evaluation:   Oxygen Discharge (Final Oxygen Re-Evaluation):   Initial Exercise Prescription:  Initial Exercise Prescription - 12/10/22 1500       Date of Initial Exercise RX and Referring Provider   Date 12/10/22    Referring Provider Ezra Shuck, MD    Expected Discharge Date 03/06/23      NuStep   Level 1    SPM 70    Minutes 15    METs 2      Prescription Details   Frequency (times per week) 3    Duration Progress to 30 minutes of continuous aerobic without signs/symptoms of physical distress      Intensity   THRR 40-80% of Max Heartrate 58-117    Ratings of Perceived Exertion 11-13    Perceived Dyspnea 0-4      Progression   Progression Continue progressive overload as per policy without signs/symptoms or physical distress.      Resistance Training   Training Prescription Yes    Weight 2    Reps 10-15             Perform Capillary Blood Glucose checks as needed.  Exercise Prescription Changes:   Exercise Prescription Changes     Row Name 12/19/22 1500 01/04/23 1500 01/18/23 1548 02/11/23 1550 02/15/23 1400     Response to Exercise   Blood Pressure (Admit) 116/68 102/60 110/60 98/40 106/60   Blood Pressure (Exercise) 128/68 128/70 118/60 -- --   Blood Pressure (Exit) 115/95 108/60 108/68 100/62 94/62   Heart Rate (Admit) 66 bpm 64 bpm 67 bpm 65 bpm 64 bpm   Heart Rate (Exercise) 101 bpm 121 bpm 112 bpm 118 bpm 103 bpm   Heart Rate (Exit) 66 bpm -- 76 bpm 74 bpm 76 bpm   Rating of Perceived Exertion (Exercise) 8 12 11 11 11    Symptoms None None None  None None   Comments Pt's first day in the cRP2 program Reviewed METs Reviewed METs and goals Reviewed METs Reviewed Home exercise Rx   Duration Continue with 30 min of aerobic exercise without signs/symptoms of physical distress. Continue with 30 min of aerobic exercise without signs/symptoms of physical distress. Continue with 30 min of aerobic exercise without signs/symptoms of physical distress. Continue with 30 min of aerobic exercise without signs/symptoms of physical distress. Continue with 30 min of aerobic exercise without signs/symptoms of physical distress.   Intensity THRR unchanged THRR unchanged THRR unchanged THRR unchanged THRR unchanged     Progression   Progression Continue to progress workloads to maintain intensity without signs/symptoms of physical distress. Continue to progress workloads to maintain intensity without signs/symptoms of physical distress. Continue to progress workloads to maintain intensity without signs/symptoms of physical distress. Continue to progress workloads to maintain intensity without signs/symptoms of physical distress. Continue to progress workloads to maintain intensity without signs/symptoms of physical distress.   Average METs 1.9 2.7 2.4 2 2.5     Resistance Training   Training Prescription No Yes Yes Yes Yes   Weight No weights on  wednesdays 2 lbs 2 lbs 2 lbs 2 lbs   Reps -- 10-15 10-15 10-15 10-15   Time -- 10 Minutes 10 Minutes 10 Minutes 10 Minutes     Interval Training   Interval Training No No No No No     NuStep   Level 1 1 2 3 3    SPM 79 -- 92 83 91   Minutes 30 30 30 30 30    METs 1.9 2.7 2.4 2 2.5     Home Exercise Plan   Plans to continue exercise at -- -- -- -- Home (comment)   Frequency -- -- -- -- Add 2 additional days to program exercise sessions.   Initial Home Exercises Provided -- -- -- -- 02/15/23            Exercise Comments:   Exercise Comments     Row Name 12/19/22 1500 01/04/23 1310 01/18/23 1622  02/11/23 1553 02/15/23 1430   Exercise Comments Pt's first day in the CRP2 program. Pt had blood pressure of 88/60, H2O given, 96/56. Reck 115/95. Pt was asymptomatic at all times. Reviewed METs with patient. He will increase workload to level 2 next session. Making good progress. Reviewed METs and goals. Pt is happy with the Nustep and wants no changes at this time. Reviewed METs. Pt has been progressing. Question accuracy of METs for this session. Reviewed Home Exercise Rx. Pt is walking and using stationary bike at home in addition to some calisthenics as well. Pt is currently meeting the goal of 150 minutes of exercise per week. Pt to continue his routine at home. Pt verbalized understanding of HERx and was provided a copy.            Exercise Goals and Review:   Exercise Goals     Row Name 12/10/22 1328             Exercise Goals   Increase Physical Activity Yes       Intervention Provide advice, education, support and counseling about physical activity/exercise needs.;Develop an individualized exercise prescription for aerobic and resistive training based on initial evaluation findings, risk stratification, comorbidities and participant's personal goals.       Expected Outcomes Short Term: Attend rehab on a regular basis to increase amount of physical activity.;Long Term: Exercising regularly at least 3-5 days a week.;Long Term: Add in home exercise to make exercise part of routine and to increase amount of physical activity.       Increase Strength and Stamina Yes       Intervention Provide advice, education, support and counseling about physical activity/exercise needs.;Develop an individualized exercise prescription for aerobic and resistive training based on initial evaluation findings, risk stratification, comorbidities and participant's personal goals.       Expected Outcomes Short Term: Increase workloads from initial exercise prescription for resistance, speed, and METs.;Short  Term: Perform resistance training exercises routinely during rehab and add in resistance training at home;Long Term: Improve cardiorespiratory fitness, muscular endurance and strength as measured by increased METs and functional capacity ( )       Able to understand and use rate of perceived exertion (RPE) scale Yes       Intervention Provide education and explanation on how to use RPE scale       Expected Outcomes Short Term: Able to use RPE daily in rehab to express subjective intensity level;Long Term:  Able to use RPE to guide intensity level when exercising independently       Knowledge and  understanding of Target Heart Rate Range (THRR) Yes       Intervention Provide education and explanation of THRR including how the numbers were predicted and where they are located for reference       Expected Outcomes Short Term: Able to state/look up THRR;Short Term: Able to use daily as guideline for intensity in rehab;Long Term: Able to use THRR to govern intensity when exercising independently       Understanding of Exercise Prescription Yes       Intervention Provide education, explanation, and written materials on patient's individual exercise prescription       Expected Outcomes Short Term: Able to explain program exercise prescription;Long Term: Able to explain home exercise prescription to exercise independently                Exercise Goals Re-Evaluation :  Exercise Goals Re-Evaluation     Row Name 12/19/22 1500 01/18/23 1557 01/18/23 1619         Exercise Goal Re-Evaluation   Exercise Goals Review Increase Physical Activity;Understanding of Exercise Prescription;Increase Strength and Stamina;Knowledge and understanding of Target Heart Rate Range (THRR);Able to understand and use rate of perceived exertion (RPE) scale -- Increase Physical Activity;Understanding of Exercise Prescription;Increase Strength and Stamina;Knowledge and understanding of Target Heart Rate Range (THRR);Able to  understand and use rate of perceived exertion (RPE) scale     Comments Pt's first day in the CRP2 program. Pt understnads the exercise Rx, RPE sclae and THRR -- Reviewed METs and goals. Pt voices progress on all his goals: increased strength and stamina, being more independent, improve blance and walk better. Peak METs to date are 3.1.     Expected Outcomes Will continue to monitor patient and progress exercise workloads as tolerated, -- Will continue to monitor patient and progress exercise workloads as tolerated.              Discharge Exercise Prescription (Final Exercise Prescription Changes):  Exercise Prescription Changes - 02/15/23 1400       Response to Exercise   Blood Pressure (Admit) 106/60    Blood Pressure (Exit) 94/62    Heart Rate (Admit) 64 bpm    Heart Rate (Exercise) 103 bpm    Heart Rate (Exit) 76 bpm    Rating of Perceived Exertion (Exercise) 11    Symptoms None    Comments Reviewed Home exercise Rx    Duration Continue with 30 min of aerobic exercise without signs/symptoms of physical distress.    Intensity THRR unchanged      Progression   Progression Continue to progress workloads to maintain intensity without signs/symptoms of physical distress.    Average METs 2.5      Resistance Training   Training Prescription Yes    Weight 2 lbs    Reps 10-15    Time 10 Minutes      Interval Training   Interval Training No      NuStep   Level 3    SPM 91    Minutes 30    METs 2.5      Home Exercise Plan   Plans to continue exercise at Home (comment)    Frequency Add 2 additional days to program exercise sessions.    Initial Home Exercises Provided 02/15/23             Nutrition:  Target Goals: Understanding of nutrition guidelines, daily intake of sodium 1500mg , cholesterol 200mg , calories 30% from fat and 7% or less from saturated fats, daily to  have 5 or more servings of fruits and vegetables.  Biometrics:  Pre Biometrics - 12/10/22 1356        Pre Biometrics   Waist Circumference 40.5 inches    Hip Circumference 40 inches    Waist to Hip Ratio 1.01 %    Triceps Skinfold 20 mm    % Body Fat 29.9 %    Grip Strength 24 kg    Flexibility 14 in    Single Leg Stand 0 seconds   did not attempt, pt has poor balance             Nutrition Therapy Plan and Nutrition Goals:  Nutrition Therapy & Goals - 12/18/22 1123       Nutrition Therapy   Diet Heart Healthy Diet    Drug/Food Interactions Statins/Certain Fruits      Personal Nutrition Goals   Nutrition Goal Patient to identify strategies for reducing cardiovascular risk by attending the [employer] education and nutrition series weekly.    Personal Goal #2 Patient to improve diet quality by using the plate method as a guide for meal planning to include lean protein/plant protein, fruits, vegetables, whole grains, nonfat dairy as part of a well-balanced diet    Personal Goal #3 Patient to limit sodium intake to 1500mg  per day    Comments Patient has medical history of CHF, DM2, ICD in place, history of gastric cancer. He continues regular follow-up with oncology. His A1c is well controlled in a pre-diabetic range. Patient will benefit from participation in intensive cardiac rehab for nutrition, exercise, and lifestyle modification.      Intervention Plan   Intervention Prescribe, educate and counsel regarding individualized specific dietary modifications aiming towards targeted core components such as weight, hypertension, lipid management, diabetes, heart failure and other comorbidities.;Nutrition handout(s) given to patient.    Expected Outcomes Short Term Goal: Understand basic principles of dietary content, such as calories, fat, sodium, cholesterol and nutrients.;Long Term Goal: Adherence to prescribed nutrition plan.             Nutrition Assessments:  MEDIFICTS Score Key: >=70 Need to make dietary changes  40-70 Heart Healthy Diet <= 40 Therapeutic  Level Cholesterol Diet   Flowsheet Row INTENSIVE CARDIAC REHAB from 01/23/2023 in [facility] for Heart, Vascular, & Lung Health  Picture Your Plate Total Score on Admission 65      Picture Your Plate Scores: <59 Unhealthy dietary pattern with much room for improvement. 41-50 Dietary pattern unlikely to meet recommendations for good health and room for improvement. 51-60 More healthful dietary pattern, with some room for improvement.  >60 Healthy dietary pattern, although there may be some specific behaviors that could be improved.    Nutrition Goals Re-Evaluation:  Nutrition Goals Re-Evaluation     Row Name 12/18/22 1123             Goals   Current Weight 180 lb 1.9 oz (81.7 kg)       Comment Lpa 189, A1c 5.7, lipids WNL, LDL ,70 (63)       Expected Outcome Patient has medical history of CHF, DM2, ICD in place, history of gastric cancer. He continues regular follow-up with oncology. His A1c is well controlled in a pre-diabetic range. Patient will benefit from participation in intensive cardiac rehab for nutrition, exercise, and lifestyle modification.                Nutrition Goals Re-Evaluation:  Nutrition Goals Re-Evaluation     Row  Name 12/18/22 1123             Goals   Current Weight 180 lb 1.9 oz (81.7 kg)       Comment Lpa 189, A1c 5.7, lipids WNL, LDL ,70 (63)       Expected Outcome Patient has medical history of CHF, DM2, ICD in place, history of gastric cancer. He continues regular follow-up with oncology. His A1c is well controlled in a pre-diabetic range. Patient will benefit from participation in intensive cardiac rehab for nutrition, exercise, and lifestyle modification.                Nutrition Goals Discharge (Final Nutrition Goals Re-Evaluation):  Nutrition Goals Re-Evaluation - 12/18/22 1123       Goals   Current Weight 180 lb 1.9 oz (81.7 kg)    Comment Lpa 189, A1c 5.7, lipids WNL, LDL ,70 (63)    Expected  Outcome Patient has medical history of CHF, DM2, ICD in place, history of gastric cancer. He continues regular follow-up with oncology. His A1c is well controlled in a pre-diabetic range. Patient will benefit from participation in intensive cardiac rehab for nutrition, exercise, and lifestyle modification.             Psychosocial: Target Goals: Acknowledge presence or absence of significant depression and/or stress, maximize coping skills, provide positive support system. Participant is able to verbalize types and ability to use techniques and skills needed for reducing stress and depression.  Initial Review & Psychosocial Screening:  Initial Psych Review & Screening - 12/10/22 1411       Initial Review   Current issues with None Identified      Family Dynamics   Good Support System? Yes   Jon Williams has his wife for support     Barriers   Psychosocial barriers to participate in program There are no identifiable barriers or psychosocial needs.      Screening Interventions   Interventions Encouraged to exercise;Provide feedback about the scores to participant    Expected Outcomes Short Term goal: Identification and review with participant of any Quality of Life or Depression concerns found by scoring the questionnaire.;Long Term goal: The participant improves quality of Life and PHQ9 Scores as seen by post scores and/or verbalization of changes             Quality of Life Scores:  Quality of Life - 12/10/22 1431       Quality of Life   Select Quality of Life      Quality of Life Scores   Health/Function Pre 26.23 %    Socioeconomic Pre 29 %    Psych/Spiritual Pre 24.86 %    Family Pre 30 %    GLOBAL Pre 27.02 %            Scores of 19 and below usually indicate a poorer quality of life in these areas.  A difference of  2-3 points is a clinically meaningful difference.  A difference of 2-3 points in the total score of the Quality of Life Index has been associated with  significant improvement in overall quality of life, self-image, physical symptoms, and general health in studies assessing change in quality of life.  PHQ-9: Review Flowsheet       12/10/2022 12/03/2019 04/02/2017  Depression screen PHQ 2/9  Decreased Interest 0 0   Down, Depressed, Hopeless 0 0   PHQ - 2 Score 0 0   Altered sleeping 0 - -  Tired, decreased energy  0 - -  Change in appetite 0 - -  Feeling bad or failure about yourself  0 - -  Trouble concentrating 0 - -  Moving slowly or fidgety/restless 0 - -  Suicidal thoughts 0 - -  PHQ-9 Score 0 - -    Details       Information is confidential and restricted. Go to Review Flowsheets to unlock data.        Interpretation of Total Score  Total Score Depression Severity:  1-4 = Minimal depression, 5-9 = Mild depression, 10-14 = Moderate depression, 15-19 = Moderately severe depression, 20-27 = Severe depression   Psychosocial Evaluation and Intervention:   Psychosocial Re-Evaluation:  Psychosocial Re-Evaluation     Row Name 12/20/22 0855 01/23/23 1705 02/18/23 1653         Psychosocial Re-Evaluation   Current issues with None Identified None Identified None Identified     Interventions Encouraged to attend Cardiac Rehabilitation for the exercise Encouraged to attend Cardiac Rehabilitation for the exercise Encouraged to attend Cardiac Rehabilitation for the exercise     Continue Psychosocial Services  No Follow up required No Follow up required No Follow up required              Psychosocial Discharge (Final Psychosocial Re-Evaluation):  Psychosocial Re-Evaluation - 02/18/23 1653       Psychosocial Re-Evaluation   Current issues with None Identified    Interventions Encouraged to attend Cardiac Rehabilitation for the exercise    Continue Psychosocial Services  No Follow up required             Vocational Rehabilitation: Provide vocational rehab assistance to qualifying candidates.   Vocational  Rehab Evaluation & Intervention:  Vocational Rehab - 12/10/22 1408       Initial Vocational Rehab Evaluation & Intervention   Assessment shows need for Vocational Rehabilitation No   Jon Williams is retired            Education: Education Goals: Education classes will be provided on a weekly basis, covering required topics. Participant will state understanding/return demonstration of topics presented.    Education     Row Name 12/19/22 1500     Education   Cardiac Education Topics [employer]   Orthoptist   Educator Dietitian   Weekly Topic Powerhouse Plant-Based Proteins   Instruction Review Code 1- Verbalizes Understanding   Class Start Time 1400   Class Stop Time 1438   Class Time Calculation (min) 38 min    Row Name 12/21/22 1400     Education   Cardiac Education Topics [employer]   Hospital Doctor Education   General Education Hypertension and Heart Disease   Instruction Review Code 1- Verbalizes Understanding   Class Start Time 1357   Class Stop Time 1430   Class Time Calculation (min) 33 min    Row Name 12/24/22 1600     Education   Cardiac Education Topics [employer]   Geographical Information Systems Officer Psychosocial   Psychosocial Workshop From Head to Heart: The Power of a Healthy Outlook   Instruction Review Code 1- Verbalizes Understanding   Class Start Time 1400   Class Stop Time 1450   Class Time Calculation (min) 50 min    Row Name 12/28/22 1500     Education   Cardiac  Education Topics Pritikin   Hospital Doctor Education   General Education Heart Disease Risk Reduction   Instruction Review Code 1- Verbalizes Understanding   Class Start Time 1400   Class Stop Time 1433   Class Time Calculation (min) 33 min    Row Name 01/02/23 1300      Education   Cardiac Education Topics Pritikin   Customer Service Manager   Weekly Topic Fast and Healthy Breakfasts   Instruction Review Code 1- Verbalizes Understanding   Class Start Time 1355   Class Stop Time 1435   Class Time Calculation (min) 40 min    Row Name 01/04/23 1500     Education   Cardiac Education Topics Pritikin   Licensed Conveyancer Nutrition   Nutrition Overview of the Pritikin Eating Plan   Instruction Review Code 1- Verbalizes Understanding   Class Start Time 1400   Class Stop Time 1445   Class Time Calculation (min) 45 min    Row Name 01/07/23 1300     Education   Cardiac Education Topics Pritikin   Nurse, Children's Exercise Physiologist   Select Psychosocial   Psychosocial Healthy Minds, Bodies, Hearts   Instruction Review Code 1- Verbalizes Understanding   Class Start Time 1357   Class Stop Time 1429   Class Time Calculation (min) 32 min    Row Name 01/09/23 1400     Education   Cardiac Education Topics Pritikin   Nurse, Children's Nurse   Select Nutrition   Nutrition Becoming a Pritikin Chef   Instruction Review Code 1- Verbalizes Understanding   Class Start Time 1356    Row Name 01/14/23 1400     Education   Cardiac Education Topics Pritikin   Select Core Videos     Core Videos   Educator Exercise Physiologist   Select Nutrition   Nutrition Other  Label Reading   Instruction Review Code 1- Verbalizes Understanding   Class Start Time 1356   Class Stop Time 1430   Class Time Calculation (min) 34 min    Row Name 01/18/23 1400     Education   Cardiac Education Topics Pritikin   Glass Blower/designer Nutrition   Nutrition Workshop Label Reading   Instruction Review Code 1- Verbalizes Understanding   Class Start Time 1401   Class Stop Time  1433   Class Time Calculation (min) 32 min    Row Name 01/21/23 1600     Education   Cardiac Education Topics Pritikin   Select Workshops     Workshops   Educator Exercise Physiologist   Select Psychosocial   Psychosocial Workshop Recognizing and Reducing Stress   Instruction Review Code 1- Verbalizes Understanding   Class Start Time 1359   Class Stop Time 1445   Class Time Calculation (min) 46 min    Row Name 01/23/23 1500     Education   Cardiac Education Topics Pritikin   Customer Service Manager   Weekly Topic Efficiency Cooking - Meals in a Snap   Instruction Review Code 1-  Verbalizes Understanding   Class Start Time 1358   Class Stop Time 1438   Class Time Calculation (min) 40 min    Row Name 01/25/23 1500     Education   Cardiac Education Topics Pritikin   Select Core Videos     Core Videos   Educator Dietitian   Select Nutrition   Nutrition Calorie Density   Instruction Review Code 1- Verbalizes Understanding   Class Start Time 1406   Class Stop Time 1501   Class Time Calculation (min) 55 min    Row Name 01/28/23 1500     Education   Cardiac Education Topics Pritikin   Geographical Information Systems Officer Exercise   Exercise Workshop Exercise Basics: Diplomatic Services Operational Officer   Instruction Review Code 1- Verbalizes Understanding   Class Start Time 1400   Class Stop Time 1450   Class Time Calculation (min) 50 min    Row Name 01/30/23 1500     Education   Cardiac Education Topics Pritikin   Customer Service Manager   Weekly Topic One-Pot Wonders   Instruction Review Code 1- Verbalizes Understanding   Class Start Time 1359   Class Stop Time 1436   Class Time Calculation (min) 37 min    Row Name 02/04/23 1400     Education   Cardiac Education Topics Pritikin   Administrator Education   General Education Hypertension and Heart Disease   Instruction Review Code 1- Verbalizes Understanding   Class Start Time 1400   Class Stop Time 1435   Class Time Calculation (min) 35 min    Row Name 02/15/23 1400     Education   Cardiac Education Topics Pritikin   Licensed Conveyancer Nutrition   Nutrition Dining Out - Part 1   Instruction Review Code 1- Verbalizes Understanding   Class Start Time 1358   Class Stop Time 1442   Class Time Calculation (min) 44 min            Core Videos: Exercise    Move It!  Clinical staff conducted group or individual video education with verbal and written material and guidebook.  Patient learns the recommended Pritikin exercise program. Exercise with the goal of living a long, healthy life. Some of the health benefits of exercise include controlled diabetes, healthier blood pressure levels, improved cholesterol levels, improved heart and lung capacity, improved sleep, and better body composition. Everyone should speak with their doctor before starting or changing an exercise routine.  Biomechanical Limitations Clinical staff conducted group or individual video education with verbal and written material and guidebook.  Patient learns how biomechanical limitations can impact exercise and how we can mitigate and possibly overcome limitations to have an impactful and balanced exercise routine.  Body Composition Clinical staff conducted group or individual video education with verbal and written material and guidebook.  Patient learns that body composition (ratio of muscle mass to fat mass) is a key component to assessing overall fitness, rather than body weight alone. Increased fat mass, especially visceral belly fat, can put us  at increased risk for metabolic syndrome, type 2 diabetes, heart disease, and even death. It is recommended to combine diet and  exercise (cardiovascular and resistance  training) to improve your body composition. Seek guidance from your physician and exercise physiologist before implementing an exercise routine.  Exercise Action Plan Clinical staff conducted group or individual video education with verbal and written material and guidebook.  Patient learns the recommended strategies to achieve and enjoy long-term exercise adherence, including variety, self-motivation, self-efficacy, and positive decision making. Benefits of exercise include fitness, good health, weight management, more energy, better sleep, less stress, and overall well-being.  Medical   Heart Disease Risk Reduction Clinical staff conducted group or individual video education with verbal and written material and guidebook.  Patient learns our heart is our most vital organ as it circulates oxygen, nutrients, white blood cells, and hormones throughout the entire body, and carries waste away. Data supports a plant-based eating plan like the Pritikin Program for its effectiveness in slowing progression of and reversing heart disease. The video provides a number of recommendations to address heart disease.   Metabolic Syndrome and Belly Fat  Clinical staff conducted group or individual video education with verbal and written material and guidebook.  Patient learns what metabolic syndrome is, how it leads to heart disease, and how one can reverse it and keep it from coming back. You have metabolic syndrome if you have 3 of the following 5 criteria: abdominal obesity, high blood pressure, high triglycerides, low HDL cholesterol, and high blood sugar.  Hypertension and Heart Disease Clinical staff conducted group or individual video education with verbal and written material and guidebook.  Patient learns that high blood pressure, or hypertension, is very common in the [place] . Hypertension is largely due to excessive salt intake, but other important risk  factors include being overweight, physical inactivity, drinking too much alcohol, smoking, and not eating enough potassium from fruits and vegetables. High blood pressure is a leading risk factor for heart attack, stroke, congestive heart failure, dementia, kidney failure, and premature death. Long-term effects of excessive salt intake include stiffening of the arteries and thickening of heart muscle and organ damage. Recommendations include ways to reduce hypertension and the risk of heart disease.  Diseases of Our Time - Focusing on Diabetes Clinical staff conducted group or individual video education with verbal and written material and guidebook.  Patient learns why the best way to stop diseases of our time is prevention, through food and other lifestyle changes. Medicine (such as prescription pills and surgeries) is often only a Band-Aid on the problem, not a long-term solution. Most common diseases of our time include obesity, type 2 diabetes, hypertension, heart disease, and cancer. The Pritikin Program is recommended and has been proven to help reduce, reverse, and/or prevent the damaging effects of metabolic syndrome.  Nutrition   Overview of the Pritikin Eating Plan  Clinical staff conducted group or individual video education with verbal and written material and guidebook.  Patient learns about the Pritikin Eating Plan for disease risk reduction. The Pritikin Eating Plan emphasizes a wide variety of unrefined, minimally-processed carbohydrates, like fruits, vegetables, whole grains, and legumes. Go, Caution, and Stop food choices are explained. Plant-based and lean animal proteins are emphasized. Rationale provided for low sodium intake for blood pressure control, low added sugars for blood sugar stabilization, and low added fats and oils for coronary artery disease risk reduction and weight management.  Calorie Density  Clinical staff conducted group or individual video education with verbal  and written material and guidebook.  Patient learns about calorie density and how it impacts the Pritikin Eating Plan. Knowing the characteristics of the  food you choose will help you decide whether those foods will lead to weight gain or weight loss, and whether you want to consume more or less of them. Weight loss is usually a side effect of the Pritikin Eating Plan because of its focus on low calorie-dense foods.  Label Reading  Clinical staff conducted group or individual video education with verbal and written material and guidebook.  Patient learns about the Pritikin recommended label reading guidelines and corresponding recommendations regarding calorie density, added sugars, sodium content, and whole grains.  Dining Out - Part 1  Clinical staff conducted group or individual video education with verbal and written material and guidebook.  Patient learns that restaurant meals can be sabotaging because they can be so high in calories, fat, sodium, and/or sugar. Patient learns recommended strategies on how to positively address this and avoid unhealthy pitfalls.  Facts on Fats  Clinical staff conducted group or individual video education with verbal and written material and guidebook.  Patient learns that lifestyle modifications can be just as effective, if not more so, as many medications for lowering your risk of heart disease. A Pritikin lifestyle can help to reduce your risk of inflammation and atherosclerosis (cholesterol build-up, or plaque, in the artery walls). Lifestyle interventions such as dietary choices and physical activity address the cause of atherosclerosis. A review of the types of fats and their impact on blood cholesterol levels, along with dietary recommendations to reduce fat intake is also included.  Nutrition Action Plan  Clinical staff conducted group or individual video education with verbal and written material and guidebook.  Patient learns how to incorporate Pritikin  recommendations into their lifestyle. Recommendations include planning and keeping personal health goals in mind as an important part of their success.  Healthy Mind-Set    Healthy Minds, Bodies, Hearts  Clinical staff conducted group or individual video education with verbal and written material and guidebook.  Patient learns how to identify when they are stressed. Video will discuss the impact of that stress, as well as the many benefits of stress management. Patient will also be introduced to stress management techniques. The way we think, act, and feel has an impact on our hearts.  How Our Thoughts Can Heal Our Hearts  Clinical staff conducted group or individual video education with verbal and written material and guidebook.  Patient learns that negative thoughts can cause depression and anxiety. This can result in negative lifestyle behavior and serious health problems. Cognitive behavioral therapy is an effective method to help control our thoughts in order to change and improve our emotional outlook.  Additional Videos:  Exercise    Improving Performance  Clinical staff conducted group or individual video education with verbal and written material and guidebook.  Patient learns to use a non-linear approach by alternating intensity levels and lengths of time spent exercising to help burn more calories and lose more body fat. Cardiovascular exercise helps improve heart health, metabolism, hormonal balance, blood sugar control, and recovery from fatigue. Resistance training improves strength, endurance, balance, coordination, reaction time, metabolism, and muscle mass. Flexibility exercise improves circulation, posture, and balance. Seek guidance from your physician and exercise physiologist before implementing an exercise routine and learn your capabilities and proper form for all exercise.  Introduction to Yoga  Clinical staff conducted group or individual video education with verbal and  written material and guidebook.  Patient learns about yoga, a discipline of the coming together of mind, breath, and body. The benefits of yoga include improved  flexibility, improved range of motion, better posture and core strength, increased lung function, weight loss, and positive self-image. Yoga's heart health benefits include lowered blood pressure, healthier heart rate, decreased cholesterol and triglyceride levels, improved immune function, and reduced stress. Seek guidance from your physician and exercise physiologist before implementing an exercise routine and learn your capabilities and proper form for all exercise.  Medical   Aging: Enhancing Your Quality of Life  Clinical staff conducted group or individual video education with verbal and written material and guidebook.  Patient learns key strategies and recommendations to stay in good physical health and enhance quality of life, such as prevention strategies, having an advocate, securing a Health Care Proxy and Power of Attorney, and keeping a list of medications and system for tracking them. It also discusses how to avoid risk for bone loss.  Biology of Weight Control  Clinical staff conducted group or individual video education with verbal and written material and guidebook.  Patient learns that weight gain occurs because we consume more calories than we burn (eating more, moving less). Even if your body weight is normal, you may have higher ratios of fat compared to muscle mass. Too much body fat puts you at increased risk for cardiovascular disease, heart attack, stroke, type 2 diabetes, and obesity-related cancers. In addition to exercise, following the Pritikin Eating Plan can help reduce your risk.  Decoding Lab Results  Clinical staff conducted group or individual video education with verbal and written material and guidebook.  Patient learns that lab test reflects one measurement whose values change over time and are influenced  by many factors, including medication, stress, sleep, exercise, food, hydration, pre-existing medical conditions, and more. It is recommended to use the knowledge from this video to become more involved with your lab results and evaluate your numbers to speak with your doctor.   Diseases of Our Time - Overview  Clinical staff conducted group or individual video education with verbal and written material and guidebook.  Patient learns that according to the CDC, 50% to 70% of chronic diseases (such as obesity, type 2 diabetes, elevated lipids, hypertension, and heart disease) are avoidable through lifestyle improvements including healthier food choices, listening to satiety cues, and increased physical activity.  Sleep Disorders Clinical staff conducted group or individual video education with verbal and written material and guidebook.  Patient learns how good quality and duration of sleep are important to overall health and well-being. Patient also learns about sleep disorders and how they impact health along with recommendations to address them, including discussing with a physician.  Nutrition  Dining Out - Part 2 Clinical staff conducted group or individual video education with verbal and written material and guidebook.  Patient learns how to plan ahead and communicate in order to maximize their dining experience in a healthy and nutritious manner. Included are recommended food choices based on the type of restaurant the patient is visiting.   Fueling a Banker conducted group or individual video education with verbal and written material and guidebook.  There is a strong connection between our food choices and our health. Diseases like obesity and type 2 diabetes are very prevalent and are in large-part due to lifestyle choices. The Pritikin Eating Plan provides plenty of food and hunger-curbing satisfaction. It is easy to follow, affordable, and helps reduce health  risks.  Menu Workshop  Clinical staff conducted group or individual video education with verbal and written material and guidebook.  Patient learns that  restaurant meals can sabotage health goals because they are often packed with calories, fat, sodium, and sugar. Recommendations include strategies to plan ahead and to communicate with the manager, chef, or server to help order a healthier meal.  Planning Your Eating Strategy  Clinical staff conducted group or individual video education with verbal and written material and guidebook.  Patient learns about the Pritikin Eating Plan and its benefit of reducing the risk of disease. The Pritikin Eating Plan does not focus on calories. Instead, it emphasizes high-quality, nutrient-rich foods. By knowing the characteristics of the foods, we choose, we can determine their calorie density and make informed decisions.  Targeting Your Nutrition Priorities  Clinical staff conducted group or individual video education with verbal and written material and guidebook.  Patient learns that lifestyle habits have a tremendous impact on disease risk and progression. This video provides eating and physical activity recommendations based on your personal health goals, such as reducing LDL cholesterol, losing weight, preventing or controlling type 2 diabetes, and reducing high blood pressure.  Vitamins and Minerals  Clinical staff conducted group or individual video education with verbal and written material and guidebook.  Patient learns different ways to obtain key vitamins and minerals, including through a recommended healthy diet. It is important to discuss all supplements you take with your doctor.   Healthy Mind-Set    Smoking Cessation  Clinical staff conducted group or individual video education with verbal and written material and guidebook.  Patient learns that cigarette smoking and tobacco addiction pose a serious health risk which affects millions of  people. Stopping smoking will significantly reduce the risk of heart disease, lung disease, and many forms of cancer. Recommended strategies for quitting are covered, including working with your doctor to develop a successful plan.  Culinary   Becoming a Set Designer conducted group or individual video education with verbal and written material and guidebook.  Patient learns that cooking at home can be healthy, cost-effective, quick, and puts them in control. Keys to cooking healthy recipes will include looking at your recipe, assessing your equipment needs, planning ahead, making it simple, choosing cost-effective seasonal ingredients, and limiting the use of added fats, salts, and sugars.  Cooking - Breakfast and Snacks  Clinical staff conducted group or individual video education with verbal and written material and guidebook.  Patient learns how important breakfast is to satiety and nutrition through the entire day. Recommendations include key foods to eat during breakfast to help stabilize blood sugar levels and to prevent overeating at meals later in the day. Planning ahead is also a key component.  Cooking - Educational Psychologist conducted group or individual video education with verbal and written material and guidebook.  Patient learns eating strategies to improve overall health, including an approach to cook more at home. Recommendations include thinking of animal protein as a side on your plate rather than center stage and focusing instead on lower calorie dense options like vegetables, fruits, whole grains, and plant-based proteins, such as beans. Making sauces in large quantities to freeze for later and leaving the skin on your vegetables are also recommended to maximize your experience.  Cooking - Healthy Salads and Dressing Clinical staff conducted group or individual video education with verbal and written material and guidebook.  Patient learns that  vegetables, fruits, whole grains, and legumes are the foundations of the Pritikin Eating Plan. Recommendations include how to incorporate each of these in flavorful and healthy salads, and  how to create homemade salad dressings. Proper handling of ingredients is also covered. Cooking - Soups and [employer] - Soups and Desserts Clinical staff conducted group or individual video education with verbal and written material and guidebook.  Patient learns that Pritikin soups and desserts make for easy, nutritious, and delicious snacks and meal components that are low in sodium, fat, sugar, and calorie density, while high in vitamins, minerals, and filling fiber. Recommendations include simple and healthy ideas for soups and desserts.   Overview     The Pritikin Solution Program Overview Clinical staff conducted group or individual video education with verbal and written material and guidebook.  Patient learns that the results of the Pritikin Program have been documented in more than 100 articles published in peer-reviewed journals, and the benefits include reducing risk factors for (and, in some cases, even reversing) high cholesterol, high blood pressure, type 2 diabetes, obesity, and more! An overview of the three key pillars of the Pritikin Program will be covered: eating well, doing regular exercise, and having a healthy mind-set.  WORKSHOPS  Exercise: Exercise Basics: Building Your Action Plan Clinical staff led group instruction and group discussion with PowerPoint presentation and patient guidebook. To enhance the learning environment the use of posters, models and videos may be added. At the conclusion of this workshop, patients will comprehend the difference between physical activity and exercise, as well as the benefits of incorporating both, into their routine. Patients will understand the FITT (Frequency, Intensity, Time, and Type) principle and how to use it to build an exercise action  plan. In addition, safety concerns and other considerations for exercise and cardiac rehab will be addressed by the presenter. The purpose of this lesson is to promote a comprehensive and effective weekly exercise routine in order to improve patients' overall level of fitness.   Managing Heart Disease: Your Path to a Healthier Heart Clinical staff led group instruction and group discussion with PowerPoint presentation and patient guidebook. To enhance the learning environment the use of posters, models and videos may be added.At the conclusion of this workshop, patients will understand the anatomy and physiology of the heart. Additionally, they will understand how Pritikin's three pillars impact the risk factors, the progression, and the management of heart disease.  The purpose of this lesson is to provide a high-level overview of the heart, heart disease, and how the Pritikin lifestyle positively impacts risk factors.  Exercise Biomechanics Clinical staff led group instruction and group discussion with PowerPoint presentation and patient guidebook. To enhance the learning environment the use of posters, models and videos may be added. Patients will learn how the structural parts of their bodies function and how these functions impact their daily activities, movement, and exercise. Patients will learn how to promote a neutral spine, learn how to manage pain, and identify ways to improve their physical movement in order to promote healthy living. The purpose of this lesson is to expose patients to common physical limitations that impact physical activity. Participants will learn practical ways to adapt and manage aches and pains, and to minimize their effect on regular exercise. Patients will learn how to maintain good posture while sitting, walking, and lifting.  Balance Training and Fall Prevention  Clinical staff led group instruction and group discussion with PowerPoint presentation and  patient guidebook. To enhance the learning environment the use of posters, models and videos may be added. At the conclusion of this workshop, patients will understand the importance of their sensorimotor skills (vision,  proprioception, and the vestibular system) in maintaining their ability to balance as they age. Patients will apply a variety of balancing exercises that are appropriate for their current level of function. Patients will understand the common causes for poor balance, possible solutions to these problems, and ways to modify their physical environment in order to minimize their fall risk. The purpose of this lesson is to teach patients about the importance of maintaining balance as they age and ways to minimize their risk of falling.  WORKSHOPS   Nutrition:  Fueling a Ship Broker led group instruction and group discussion with PowerPoint presentation and patient guidebook. To enhance the learning environment the use of posters, models and videos may be added. Patients will review the foundational principles of the Pritikin Eating Plan and understand what constitutes a serving size in each of the food groups. Patients will also learn Pritikin-friendly foods that are better choices when away from home and review make-ahead meal and snack options. Calorie density will be reviewed and applied to three nutrition priorities: weight maintenance, weight loss, and weight gain. The purpose of this lesson is to reinforce (in a group setting) the key concepts around what patients are recommended to eat and how to apply these guidelines when away from home by planning and selecting Pritikin-friendly options. Patients will understand how calorie density may be adjusted for different weight management goals.  Mindful Eating  Clinical staff led group instruction and group discussion with PowerPoint presentation and patient guidebook. To enhance the learning environment the use of posters,  models and videos may be added. Patients will briefly review the concepts of the Pritikin Eating Plan and the importance of low-calorie dense foods. The concept of mindful eating will be introduced as well as the importance of paying attention to internal hunger signals. Triggers for non-hunger eating and techniques for dealing with triggers will be explored. The purpose of this lesson is to provide patients with the opportunity to review the basic principles of the Pritikin Eating Plan, discuss the value of eating mindfully and how to measure internal cues of hunger and fullness using the Hunger Scale. Patients will also discuss reasons for non-hunger eating and learn strategies to use for controlling emotional eating.  Targeting Your Nutrition Priorities Clinical staff led group instruction and group discussion with PowerPoint presentation and patient guidebook. To enhance the learning environment the use of posters, models and videos may be added. Patients will learn how to determine their genetic susceptibility to disease by reviewing their family history. Patients will gain insight into the importance of diet as part of an overall healthy lifestyle in mitigating the impact of genetics and other environmental insults. The purpose of this lesson is to provide patients with the opportunity to assess their personal nutrition priorities by looking at their family history, their own health history and current risk factors. Patients will also be able to discuss ways of prioritizing and modifying the Pritikin Eating Plan for their highest risk areas  Menu  Clinical staff led group instruction and group discussion with PowerPoint presentation and patient guidebook. To enhance the learning environment the use of posters, models and videos may be added. Using menus brought in from [employer], or printed from [employer], patients will apply the Pritikin dining out guidelines that were presented in the  [employer] video. Patients will also be able to practice these guidelines in a variety of provided scenarios. The purpose of this lesson is to provide patients with  the opportunity to practice hands-on learning of the [employer] guidelines with actual menus and practice scenarios.  Label Reading Clinical staff led group instruction and group discussion with PowerPoint presentation and patient guidebook. To enhance the learning environment the use of posters, models and videos may be added. Patients will review and discuss the Pritikin label reading guidelines presented in Pritikin's Label Reading Educational series video. Using fool labels brought in from local grocery stores and markets, patients will apply the label reading guidelines and determine if the packaged food meet the Pritikin guidelines. The purpose of this lesson is to provide patients with the opportunity to review, discuss, and practice hands-on learning of the Pritikin Label Reading guidelines with actual packaged food labels. Cooking School  Pritikin's [employer] are designed to teach patients ways to prepare quick, simple, and affordable recipes at home. The importance of nutrition's role in chronic disease risk reduction is reflected in its emphasis in the overall Pritikin program. By learning how to prepare essential core Pritikin Eating Plan recipes, patients will increase control over what they eat; be able to customize the flavor of foods without the use of added salt, sugar, or fat; and improve the quality of the food they consume. By learning a set of core recipes which are easily assembled, quickly prepared, and affordable, patients are more likely to prepare more healthy foods at home. These workshops focus on convenient breakfasts, simple entres, side dishes, and desserts which can be prepared with minimal effort and are consistent with nutrition recommendations for cardiovascular risk  reduction. Cooking [employer] are taught by a armed forces logistics/support/administrative officer (RD) who has been trained by the [employer]. The chef or RD has a clear understanding of the importance of minimizing - if not completely eliminating - added fat, sugar, and sodium in recipes. Throughout the series of Cooking School Workshop sessions, patients will learn about healthy ingredients and efficient methods of cooking to build confidence in their capability to prepare    Cooking School weekly topics:  Adding Flavor- Sodium-Free  Fast and Healthy Breakfasts  Powerhouse Plant-Based Proteins  Satisfying Salads and Dressings  Simple Sides and Sauces  International Cuisine-Spotlight on the [employer] Zones  Delicious Desserts  Savory Soups  [employer] - Meals in a Astronomer Appetizers and Snacks  Comforting Weekend Breakfasts  One-Pot Wonders   Fast Evening Meals  Landscape Architect Your Pritikin Plate  WORKSHOPS   Healthy Mindset (Psychosocial):  Focused Goals, Sustainable Changes Clinical staff led group instruction and group discussion with PowerPoint presentation and patient guidebook. To enhance the learning environment the use of posters, models and videos may be added. Patients will be able to apply effective goal setting strategies to establish at least one personal goal, and then take consistent, meaningful action toward that goal. They will learn to identify common barriers to achieving personal goals and develop strategies to overcome them. Patients will also gain an understanding of how our mind-set can impact our ability to achieve goals and the importance of cultivating a positive and growth-oriented mind-set. The purpose of this lesson is to provide patients with a deeper understanding of how to set and achieve personal goals, as well as the tools and strategies needed to overcome common obstacles which may arise along the way.  From Head to Heart: The  Power of a Healthy Outlook  Clinical staff led group instruction and group discussion with PowerPoint presentation and patient guidebook. To enhance the  learning environment the use of posters, models and videos may be added. Patients will be able to recognize and describe the impact of emotions and mood on physical health. They will discover the importance of self-care and explore self-care practices which may work for them. Patients will also learn how to utilize the 4 C's to cultivate a healthier outlook and better manage stress and challenges. The purpose of this lesson is to demonstrate to patients how a healthy outlook is an essential part of maintaining good health, especially as they continue their cardiac rehab journey.  Healthy Sleep for a Healthy Heart Clinical staff led group instruction and group discussion with PowerPoint presentation and patient guidebook. To enhance the learning environment the use of posters, models and videos may be added. At the conclusion of this workshop, patients will be able to demonstrate knowledge of the importance of sleep to overall health, well-being, and quality of life. They will understand the symptoms of, and treatments for, common sleep disorders. Patients will also be able to identify daytime and nighttime behaviors which impact sleep, and they will be able to apply these tools to help manage sleep-related challenges. The purpose of this lesson is to provide patients with a general overview of sleep and outline the importance of quality sleep. Patients will learn about a few of the most common sleep disorders. Patients will also be introduced to the concept of "sleep hygiene," and discover ways to self-manage certain sleeping problems through simple daily behavior changes. Finally, the workshop will motivate patients by clarifying the links between quality sleep and their goals of heart-healthy living.   Recognizing and Reducing Stress Clinical staff led  group instruction and group discussion with PowerPoint presentation and patient guidebook. To enhance the learning environment the use of posters, models and videos may be added. At the conclusion of this workshop, patients will be able to understand the types of stress reactions, differentiate between acute and chronic stress, and recognize the impact that chronic stress has on their health. They will also be able to apply different coping mechanisms, such as reframing negative self-talk. Patients will have the opportunity to practice a variety of stress management techniques, such as deep abdominal breathing, progressive muscle relaxation, and/or guided imagery.  The purpose of this lesson is to educate patients on the role of stress in their lives and to provide healthy techniques for coping with it.  Learning Barriers/Preferences:  Learning Barriers/Preferences - 12/10/22 1409       Learning Barriers/Preferences   Learning Barriers Sight   reading glasses   Learning Preferences Audio;Computer/Internet;Group Instruction;Pictoral;Individual Instruction;Skilled Demonstration;Verbal Instruction;Video;Written Material             Education Topics:  Knowledge Questionnaire Score:  Knowledge Questionnaire Score - 12/10/22 1407       Knowledge Questionnaire Score   Pre Score 18/24             Core Components/Risk Factors/Patient Goals at Admission:  Personal Goals and Risk Factors at Admission - 12/10/22 1403       Core Components/Risk Factors/Patient Goals on Admission    Weight Management Yes    Intervention Weight Management: Develop a combined nutrition and exercise program designed to reach desired caloric intake, while maintaining appropriate intake of nutrient and fiber, sodium and fats, and appropriate energy expenditure required for the weight goal.;Weight Management: Provide education and appropriate resources to help participant work on and attain dietary goals.     Expected Outcomes Short Term: Continue to assess and modify  interventions until short term weight is achieved;Long Term: Adherence to nutrition and physical activity/exercise program aimed toward attainment of established weight goal;Understanding recommendations for meals to include 15-35% energy as protein, 25-35% energy from fat, 35-60% energy from carbohydrates, less than 200mg  of dietary cholesterol, 20-35 gm of total fiber daily;Understanding of distribution of calorie intake throughout the day with the consumption of 4-5 meals/snacks    Diabetes Yes    Intervention Provide education about signs/symptoms and action to take for hypo/hyperglycemia.;Provide education about proper nutrition, including hydration, and aerobic/resistive exercise prescription along with prescribed medications to achieve blood glucose in normal ranges: Fasting glucose 65-99 mg/dL    Expected Outcomes Short Term: Participant verbalizes understanding of the signs/symptoms and immediate care of hyper/hypoglycemia, proper foot care and importance of medication, aerobic/resistive exercise and nutrition plan for blood glucose control.;Long Term: Attainment of HbA1C < 7%.    Heart Failure Yes    Intervention Provide a combined exercise and nutrition program that is supplemented with education, support and counseling about heart failure. Directed toward relieving symptoms such as shortness of breath, decreased exercise tolerance, and extremity edema.    Expected Outcomes Improve functional capacity of life;Short term: Attendance in program 2-3 days a week with increased exercise capacity. Reported lower sodium intake. Reported increased fruit and vegetable intake. Reports medication compliance.;Short term: Daily weights obtained and reported for increase. Utilizing diuretic protocols set by physician.;Long term: Adoption of self-care skills and reduction of barriers for early signs and symptoms recognition and intervention leading to  self-care maintenance.    Hypertension Yes    Intervention Monitor prescription use compliance.;Provide education on lifestyle modifcations including regular physical activity/exercise, weight management, moderate sodium restriction and increased consumption of fresh fruit, vegetables, and low fat dairy, alcohol moderation, and smoking cessation.    Expected Outcomes Short Term: Continued assessment and intervention until BP is < 140/30mm HG in hypertensive participants. < 130/27mm HG in hypertensive participants with diabetes, heart failure or chronic kidney disease.;Long Term: Maintenance of blood pressure at goal levels.    Lipids Yes    Intervention Provide education and support for participant on nutrition & aerobic/resistive exercise along with prescribed medications to achieve LDL 70mg , HDL >40mg .    Expected Outcomes Short Term: Participant states understanding of desired cholesterol values and is compliant with medications prescribed. Participant is following exercise prescription and nutrition guidelines.;Long Term: Cholesterol controlled with medications as prescribed, with individualized exercise RX and with personalized nutrition plan. Value goals: LDL < 70mg , HDL > 40 mg.             Core Components/Risk Factors/Patient Goals Review:   Goals and Risk Factor Review     Row Name 12/20/22 0856 01/01/23 1314 01/23/23 1705 02/18/23 1654       Core Components/Risk Factors/Patient Goals Review   Personal Goals Review Weight Management/Obesity;Lipids;Diabetes;Heart Failure;Hypertension Weight Management/Obesity;Lipids;Diabetes;Heart Failure;Hypertension Weight Management/Obesity;Lipids;Diabetes;Heart Failure;Hypertension Weight Management/Obesity;Lipids;Diabetes;Heart Failure;Hypertension    Review Jon Williams  started cardiac rehab on 12/19/22. Jon Williams did fair with exercise for his fitness level. Jon Williams was hypotensive post exercise. Asymptomatic. Given water. CBG's were WNL. Jon Williams  started  cardiac rehab on 12/19/22. Jon Williams is off to a good start to  exercise Jon Williams is doing well with exercise at  cardiac rehab. vital signs have been stable Jon Williams is doing well with exercise at  cardiac rehab. vital signs have been stable. Jon Williams will complete cardiac rehab on 03/01/23    Expected Outcomes Jon Williams will continue to participate in cardiac rehab for exercise, nutrition and lifestyle  modifications. Jon Williams will continue to participate in cardiac rehab for exercise, nutrition and lifestyle modifications. Jon Williams will continue to participate in cardiac rehab for exercise, nutrition and lifestyle modifications. Jon Williams will continue to participate in cardiac rehab for exercise, nutrition and lifestyle modifications.             Core Components/Risk Factors/Patient Goals at Discharge (Final Review):   Goals and Risk Factor Review - 02/18/23 1654       Core Components/Risk Factors/Patient Goals Review   Personal Goals Review Weight Management/Obesity;Lipids;Diabetes;Heart Failure;Hypertension    Review Jon Williams is doing well with exercise at  cardiac rehab. vital signs have been stable. Jon Williams will complete cardiac rehab on 03/01/23    Expected Outcomes Tayjon will continue to participate in cardiac rehab for exercise, nutrition and lifestyle modifications.             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 Introduction to pritikin education/ intensvie cardiac rehab. Initial orientation packet reviewed with patient. 30 Day ITP Review. Donal started cardiac rehab on 12/19/22. Lenis did fair with exercise for his fitness level as Wildon is somewhat deconditioned. 30 Day ITP Review. Donal started cardiac rehab on 12/19/22. Dejon is off to a good start to  exercise 30 Day ITP Review. Mcarthur has good attendance and participation in  cardiac rehab 30 Day ITP Review. Kire  continues to have  good attendance and participation in  cardiac rehab            Comments: See ITP comments.Hadassah Elpidio Quan RN BSN

## 2023-02-18 NOTE — Assessment & Plan Note (Signed)
-  diagnosed with Gleason 4+4 prostate cancer in early 2019. He was treated with long-term ADT in combination with 8 weeks of IMRT.

## 2023-02-20 ENCOUNTER — Inpatient Hospital Stay (HOSPITAL_BASED_OUTPATIENT_CLINIC_OR_DEPARTMENT_OTHER): Payer: Medicare HMO | Admitting: Hematology

## 2023-02-20 ENCOUNTER — Encounter (HOSPITAL_COMMUNITY)
Admission: RE | Admit: 2023-02-20 | Discharge: 2023-02-20 | Disposition: A | Discharge status: Home / Self Care | Payer: Medicare HMO | Source: Ambulatory Visit | Attending: Cardiology | Admitting: Cardiology

## 2023-02-20 ENCOUNTER — Encounter (HOSPITAL_COMMUNITY): Payer: Medicare HMO

## 2023-02-20 ENCOUNTER — Encounter: Payer: Self-pay | Admitting: Hematology

## 2023-02-20 ENCOUNTER — Inpatient Hospital Stay: Payer: Medicare HMO

## 2023-02-20 VITALS — BP 107/70 | HR 61 | Temp 97.8°F | Resp 16 | Wt 178.0 lb

## 2023-02-20 DIAGNOSIS — C61 Malignant neoplasm of prostate: Secondary | ICD-10-CM

## 2023-02-20 DIAGNOSIS — C163 Malignant neoplasm of pyloric antrum: Secondary | ICD-10-CM

## 2023-02-20 DIAGNOSIS — I5022 Chronic systolic (congestive) heart failure: Secondary | ICD-10-CM | POA: Diagnosis not present

## 2023-02-20 DIAGNOSIS — C169 Malignant neoplasm of stomach, unspecified: Secondary | ICD-10-CM | POA: Insufficient documentation

## 2023-02-20 DIAGNOSIS — D500 Iron deficiency anemia secondary to blood loss (chronic): Secondary | ICD-10-CM

## 2023-02-20 DIAGNOSIS — Z5189 Encounter for other specified aftercare: Secondary | ICD-10-CM | POA: Diagnosis not present

## 2023-02-20 DIAGNOSIS — Z95828 Presence of other vascular implants and grafts: Secondary | ICD-10-CM

## 2023-02-20 DIAGNOSIS — I130 Hypertensive heart and chronic kidney disease with heart failure and stage 1 through stage 4 chronic kidney disease, or unspecified chronic kidney disease: Secondary | ICD-10-CM | POA: Diagnosis not present

## 2023-02-20 LAB — CBC WITH DIFFERENTIAL (CANCER CENTER ONLY)
Abs Immature Granulocytes: 0.01 10*3/uL (ref 0.00–0.07)
Basophils Absolute: 0 10*3/uL (ref 0.0–0.1)
Basophils Relative: 1 %
Eosinophils Absolute: 0.2 10*3/uL (ref 0.0–0.5)
Eosinophils Relative: 3 %
HCT: 33 % — ABNORMAL LOW (ref 39.0–52.0)
Hemoglobin: 10.8 g/dL — ABNORMAL LOW (ref 13.0–17.0)
Immature Granulocytes: 0 %
Lymphocytes Relative: 17 %
Lymphs Abs: 1.1 10*3/uL (ref 0.7–4.0)
MCH: 29.9 pg (ref 26.0–34.0)
MCHC: 32.7 g/dL (ref 30.0–36.0)
MCV: 91.4 fL (ref 80.0–100.0)
Monocytes Absolute: 0.7 10*3/uL (ref 0.1–1.0)
Monocytes Relative: 11 %
Neutro Abs: 4.2 10*3/uL (ref 1.7–7.7)
Neutrophils Relative %: 68 %
Platelet Count: 224 10*3/uL (ref 150–400)
RBC: 3.61 MIL/uL — ABNORMAL LOW (ref 4.22–5.81)
RDW: 13.6 % (ref 11.5–15.5)
WBC Count: 6.2 10*3/uL (ref 4.0–10.5)
nRBC: 0 % (ref 0.0–0.2)

## 2023-02-20 LAB — CMP (CANCER CENTER ONLY)
ALT: 15 U/L (ref 0–44)
AST: 20 U/L (ref 15–41)
Albumin: 4.3 g/dL (ref 3.5–5.0)
Alkaline Phosphatase: 47 U/L (ref 38–126)
Anion gap: 8 (ref 5–15)
BUN: 22 mg/dL (ref 8–23)
CO2: 24 mmol/L (ref 22–32)
Calcium: 9.4 mg/dL (ref 8.9–10.3)
Chloride: 104 mmol/L (ref 98–111)
Creatinine: 1.26 mg/dL — ABNORMAL HIGH (ref 0.61–1.24)
GFR, Estimated: 60 mL/min — ABNORMAL LOW (ref >60)
Glucose, Bld: 102 mg/dL — ABNORMAL HIGH (ref 70–99)
Potassium: 4.1 mmol/L (ref 3.5–5.1)
Sodium: 136 mmol/L (ref 135–145)
Total Bilirubin: 0.7 mg/dL (ref 0.0–1.2)
Total Protein: 7.3 g/dL (ref 6.5–8.1)

## 2023-02-20 LAB — FERRITIN: Ferritin: 13 ng/mL — ABNORMAL LOW (ref 24–336)

## 2023-02-20 MED ORDER — SODIUM CHLORIDE 0.9% FLUSH
10.0000 mL | Freq: Once | INTRAVENOUS | Status: AC
  Administered 2023-02-20: 10 mL

## 2023-02-20 MED ORDER — HEPARIN SOD (PORK) LOCK FLUSH 100 UNIT/ML IV SOLN
500.0000 [IU] | Freq: Once | INTRAVENOUS | Status: AC
  Administered 2023-02-20: 500 [IU]

## 2023-02-20 NOTE — Progress Notes (Signed)
 [facility]   Telephone:(336) [phone] [phone]   Clinic Follow up Note   Patient Care Team: [name], MD as PCP - General (Internal Medicine) [name], Aleene PARAS, MD as PCP - Cardiology (Cardiology) Waddell Danelle ORN, MD as PCP - Electrophysiology (Cardiology) Dasie Leonor CROME, MD as Consulting Physician (General Surgery) Lanny Callander, MD as Consulting Physician (Hematology) Rollin Dover, MD as Consulting Physician (Gastroenterology)  Date of Service:  02/20/2023  CHIEF COMPLAINT: f/u of gastric cancer  CURRENT THERAPY:  Surveillance  Oncology History   Malignant neoplasm of prostate [facility]) -diagnosed with Gleason 4+4 prostate cancer in early 2019. He was treated with long-term ADT in combination with 8 weeks of IMRT.   Gastric cancer (HCC) cT3N0M0, stage II -Diagnosed 11/2020, path showed loss of MLH1 and PMS2 which predicts good response to immunotherapy  -Deemed a high risk surgical candidate.  S/p FLOT4 01/02/2021 - 03/22/2021 -Posttreatment EUS 05/2021 and 06/29/2022 by Dr. Rollin showed a scar and residual erythema in stomach, biopsy negative for residual malignancy.  -Mr Parkey is clinically doing well on surveillance. Last CT 03/20/22 which shows no evidence of recurrent or metastatic disease, and stable bilateral adrenal adenomas -Surveillance CT scan from October 09, 2022 showed no evidence of recurrence -He is clinically doing well overall, will continue monitoring.    Assessment and Plan    Gastric Cancer 73 year old with gastric cancer diagnosed in October 2022. Complete response to chemotherapy. No significant stomach issues, only occasional tightness. Under surveillance for five years post-treatment, with follow-ups every six months after three years. Discussed CT scan safety and continued monitoring. Prefers to avoid general anesthesia for port removal due to heart condition. - Order CT scan in three months - Schedule follow-up appointment in  three months - Flush port every six to eight weeks - Consider port removal if next scan is clear and patient prefers  Mild Anemia Mild anemia with hemoglobin level of 10.8, down from 11.9 three weeks ago. No signs of bleeding. Not taking iron  supplements, only a multivitamin. Discussed potential need for oral or IV iron  supplementation based on lab results. - Check iron  levels - Consider oral iron  supplementation if iron  levels are low - Repeat blood count in six weeks - Consider IV iron  if iron  levels are very low  Peripheral Neuropathy Persistent neuropathy with symptoms of tightness, needles, and pins in the feet. Balance improving, allowing for more independent walking. - Encourage physical activity and balance exercises  Chronic Kidney Disease Slightly elevated creatinine levels indicating mild kidney dysfunction. Likely influenced by heart condition and medications. Discussed impact of fluid intake and medications on kidney function. - Monitor kidney function regularly - Coordinate care with cardiologist regarding fluid intake and medications   Plan -Lab reviewed, mild anemia, ferritin 13, I recommend him to start oral iron  pill -Lab and port flush in 6 weeks - Schedule CT scan and blood tests for the last week of March - Follow-up appointment in the first week of April.       SUMMARY OF ONCOLOGIC HISTORY: Oncology History Overview Note  Cancer Staging Gastric cancer [facility]) Staging form: Stomach, AJCC 8th Edition - Clinical stage from 12/01/2020: Stage IIB (cT3, cN0, cM0) - Signed by Lanny Callander, MD on 12/20/2020 Stage prefix: Initial diagnosis Total positive nodes: 0  Malignant neoplasm of prostate (HCC) Staging form: Prostate, AJCC 8th Edition - Clinical: Stage IIC (cT2b, cN0, cM0, PSA: 6.7, Grade Group: 4) - Unsigned Prostate specific antigen (PSA) range: Less than 10 Gleason  score: 8 Histologic grading system: 5 grade system    Gastric cancer (HCC)  12/01/2020  Procedure   Upper Endoscopy, Dr. Rollin  Impression: - Normal esophagus. 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 Biopsied. - Normal examined duodenum.   12/01/2020 Pathology Results   FINAL MICROSCOPIC DIAGNOSIS:   A. INCISURA, BIOPSY:  - Adenocarcinoma, moderate to poorly differentiated arising in a  background of chronic gastritis with intestinal metaplasia.    12/01/2020 Imaging   CT CAP  IMPRESSION: Focal wall thickening along the posterior aspect of the gastric antrum, likely corresponding to the patient's newly diagnosed gastric cancer.   No findings suspicious for metastatic disease.   Mild multifocal pneumonia, lower lobe predominant, likely on the basis of aspiration. Trace right pleural effusion.   Fiducial markers along the prostate in this patient with known prostate cancer.   12/01/2020 Cancer Staging   Staging form: Stomach, AJCC 8th Edition - Clinical stage from 12/01/2020: Stage IIB (cT3, cN0, cM0) - Signed by Lanny Callander, MD on 12/20/2020 Stage prefix: Initial diagnosis Total positive nodes: 0   12/08/2020 Initial Diagnosis   Gastric cancer (HCC)   12/16/2020 Procedure   EUS  - Wall thickening was seen in the lesser curve of the stomach and in the antrum of the stomach. The thickening appeared to be primarily within the serosa (Layer 5). T3 N0 Mx. - There was no sign of significant pathology in the entire pancreas. - There was no sign of significant pathology in the common bile duct and in the gallbladder. - There was no evidence of significant pathology in the left lobe of the liver. - Endosonographic images of the left adrenal gland were unremarkable. - The celiac trunk and superior mesenteric artery were endosonographically normal. - The mediastinum was unremarkable endosonographically. - No specimens collected.   01/02/2021 - 03/23/2021 Chemotherapy   Patient is on Treatment Plan : GASTROESOPHAGEAL FLOT q14d X 4 cycles     04/01/2021 Imaging    EXAM: CT ABDOMEN AND PELVIS WITHOUT CONTRAST  IMPRESSION: 1. No acute findings in the abdomen or pelvis. Specifically, no evidence for metastatic disease in the abdomen or pelvis. 2. Stable 16 mm left adrenal adenoma. 3. Small fat containing hernias in the right groin and umbilical region. 4. Bilateral pars interarticularis defects at L4 with 10 mm anterolisthesis of L4 on 5, stable. 5. Aortic Atherosclerosis (ICD10-I70.0).   04/13/2021 Imaging   EXAM: CT CHEST WITHOUT CONTRAST  IMPRESSION: 1. No evidence of metastatic disease in the chest. 2. Enlargement of the main pulmonary artery, as can be seen in pulmonary hypertension. 3. Coronary artery disease.   Aortic Atherosclerosis (ICD10-I70.0).   06/09/2021 Procedure   Upper GI Endoscopy, Dr. Rollin  Findings: -The esophagus was normal. -A medium healed ulcer was found on the lesser curvature of the stomach. Biopsies were taken with a cold forceps for histology. -The examined duodenum was normal. -Compared to the prior EGD there is a marked improvement in his gastric cancer. There was only evidence of erythema and a central scar at the incisura. Cold biopsies of the area were obtained, but there was no gross evidence of malignancy.  Impression: - Normal esophagus. - Scar in the lesser curvature of the stomach. Biopsied. - Normal examined duodenum.   06/09/2021 Pathology Results   FINAL MICROSCOPIC DIAGNOSIS:   A. STOMACH, BIOPSY:  - Gastric mucosa with acute and chronic inflammation.  - No dysplasia or malignancy identified.    06/16/2021 - 06/16/2021 Chemotherapy   Patient is on  Treatment Plan : GASTROESOPHAGEAL Pembrolizumab (200) q21d        Discussed the use of AI scribe software for clinical note transcription with the patient, who gave verbal consent to proceed.  History of Present Illness   The patient, a 73 year old male with a history of gastric cancer, presents for a routine follow-up. He reports maintaining his  health and participating in cardiac rehabilitation three times a week, which he finds beneficial for his strength and energy levels. He also reports persistent neuropathy, describing a sensation of needles and pins in his feet, but notes that his balance has improved, allowing for more independent walking. He is able to perform self-care tasks such as showering and brushing his teeth. He denies any stomach issues, except for occasional tightness. He has not noticed any hair loss from chemotherapy, and he has not experienced any bleeding. He has a port that is flushed every six to eight weeks.         All other systems were reviewed with the patient and are negative.  MEDICAL HISTORY:  Past Medical History:  Diagnosis Date   AICD (automatic cardioverter/defibrillator) present 05/25/2016   biv icd   Bunion, right    Chronic combined systolic and diastolic CHF (congestive heart failure) (HCC)    Echo 1/18: Mild conc LVH, EF 15-20, severe diff HK, inf and inf-septal AK, Gr 3 DD, mild to mod MR, severe LAE, mod reduced RVSF, mod RAE, mild TR, PASP 50   Coronary artery disease involving native coronary artery without angina pectoris 04/17/2016   LHC 1/18: pLCx 30, mLCx 20, mRCA 40, dRCA 20, LVEDP 23, mean RA 8, PA 42/20, PCWP 17   Diabetes mellitus without complication (HCC)    Gastric cancer (HCC)    History of atrial fibrillation    History of atrial flutter    History of cardiomegaly 06/07/2016   Noted on CXR   History of colon polyps 06/28/2017   Noted on colonoscopy   Hypertension    LBBB (left bundle branch block)    Nausea vomiting and diarrhea 04/04/2021   NICM (nonischemic cardiomyopathy) (HCC)    Echo 1/18:  Mild conc LVH, EF 15-20, severe diff HK, inf and inf-septal AK, Gr 3 DD, mild to mod MR, severe LAE, mod reduced RVSF, mod RAE, mild TR, PASP 50   Other secondary pulmonary hypertension (HCC) 04/17/2016   Prostate cancer (HCC) 2019   Sigmoid diverticulosis 06/28/2017    Noted on colonoscopy    SURGICAL HISTORY: Past Surgical History:  Procedure Laterality Date   BIOPSY  12/01/2020   Procedure: BIOPSY;  Surgeon: Rollin Dover, MD;  Location: [facility] ENDOSCOPY;  Service: Endoscopy;;   BIOPSY  06/09/2021   Procedure: BIOPSY;  Surgeon: Rollin Dover, MD;  Location: [facility] ENDOSCOPY;  Service: Gastroenterology;;   BIOPSY  06/29/2022   Procedure: BIOPSY;  Surgeon: Rollin Dover, MD;  Location: [facility] ENDOSCOPY;  Service: Gastroenterology;;   BIV ICD INSERTION CRT-D N/A 05/25/2016   Procedure: BiV ICD Insertion CRT-D;  Surgeon: Danelle LELON Birmingham, MD;  Location: [facility] INVASIVE CV LAB;  Service: Cardiovascular;  Laterality: N/A;   CARDIAC CATHETERIZATION N/A 03/02/2016   Procedure: Right/Left Heart Cath and Coronary Angiography;  Surgeon: Lonni Hanson, MD;  Location: [facility] INVASIVE CV LAB;  Service: Cardiovascular;  Laterality: N/A;   CARDIOVERSION N/A 07/17/2016   Procedure: Cardioversion;  Surgeon: Birmingham Danelle LELON, MD;  Location: [facility] INVASIVE CV LAB;  Service: Cardiovascular;  Laterality: N/A;   COLONOSCOPY WITH PROPOFOL  N/A 06/28/2017   Procedure:  COLONOSCOPY WITH PROPOFOL ;  Surgeon: Rollin Dover, MD;  Location: [facility] ENDOSCOPY;  Service: Endoscopy;  Laterality: N/A;   colonscopy  2009   ESOPHAGOGASTRODUODENOSCOPY N/A 12/01/2020   Procedure: ESOPHAGOGASTRODUODENOSCOPY (EGD);  Surgeon: Rollin Dover, MD;  Location: [facility] ENDOSCOPY;  Service: Endoscopy;  Laterality: N/A;  IDA/guaiac positive stools   ESOPHAGOGASTRODUODENOSCOPY (EGD) WITH PROPOFOL  N/A 12/16/2020   Procedure: ESOPHAGOGASTRODUODENOSCOPY (EGD) WITH PROPOFOL ;  Surgeon: Rollin Dover, MD;  Location: [facility] ENDOSCOPY;  Service: Endoscopy;  Laterality: N/A;   ESOPHAGOGASTRODUODENOSCOPY (EGD) WITH PROPOFOL  N/A 06/09/2021   Procedure: ESOPHAGOGASTRODUODENOSCOPY (EGD) WITH PROPOFOL ;  Surgeon: Rollin Dover, MD;  Location: [facility] ENDOSCOPY;  Service: Gastroenterology;  Laterality: N/A;   ESOPHAGOGASTRODUODENOSCOPY (EGD) WITH PROPOFOL  N/A 06/29/2022    Procedure: ESOPHAGOGASTRODUODENOSCOPY (EGD) WITH PROPOFOL ;  Surgeon: Rollin Dover, MD;  Location: [facility] ENDOSCOPY;  Service: Gastroenterology;  Laterality: N/A;   GOLD SEED IMPLANT N/A 12/24/2017   Procedure: GOLD SEED IMPLANT, TRANSERINEAL;  Surgeon: Nieves Cough, MD;  Location: [facility] ORS;  Service: Urology;  Laterality: N/A;   INSERT / REPLACE / REMOVE PACEMAKER     LEAD REVISION  10/10/2018   LEAD REVISION/REPAIR N/A 10/10/2018   Procedure: LEAD REVISION/REPAIR;  Surgeon: Waddell Danelle ORN, MD;  Location: [facility] INVASIVE CV LAB;  Service: Cardiovascular;  Laterality: N/A;   POLYPECTOMY  06/28/2017   Procedure: POLYPECTOMY;  Surgeon: Rollin Dover, MD;  Location: [facility] ENDOSCOPY;  Service: Endoscopy;;  ascending and descending colon polyp   PORTACATH PLACEMENT Right 12/23/2020   Procedure: INSERTION PORT-A-CATH;  Surgeon: Dasie Leonor CROME, MD;  Location: [facility] OR;  Service: General;  Laterality: Right;   PROSTATE BIOPSY  02/20/2017   RIGHT/LEFT HEART CATH AND CORONARY ANGIOGRAPHY N/A 10/30/2022   Procedure: RIGHT/LEFT HEART CATH AND CORONARY ANGIOGRAPHY;  Surgeon: Rolan Ezra RAMAN, MD;  Location: [facility] INVASIVE CV LAB;  Service: Cardiovascular;  Laterality: N/A;   SPACE OAR INSTILLATION N/A 12/24/2017   Procedure: SPACE OAR INSTILLATION;  Surgeon: Nieves Cough, MD;  Location: [facility] ORS;  Service: Urology;  Laterality: N/A;   TOTAL KNEE ARTHROPLASTY Right 08/16/2017   Procedure: RIGHT TOTAL KNEE ARTHROPLASTY;  Surgeon: Shari Sieving, MD;  Location: [facility] OR;  Service: Orthopedics;  Laterality: Right;   UPPER ESOPHAGEAL ENDOSCOPIC ULTRASOUND (EUS) N/A 12/16/2020   Procedure: UPPER ESOPHAGEAL ENDOSCOPIC ULTRASOUND (EUS);  Surgeon: Rollin Dover, MD;  Location: [facility] ENDOSCOPY;  Service: Endoscopy;  Laterality: N/A;    I have reviewed the social history and family history with the patient and they are unchanged from previous note.  ALLERGIES:  is allergic to aspirin  and sulfa antibiotics.  MEDICATIONS:  Current  Outpatient Medications  Medication Sig Dispense Refill   atorvastatin  (LIPITOR) 40 MG tablet Take 1 tablet (40 mg total) by mouth daily. 90 tablet 1   Biotin 1000 MCG tablet Take 1,000 mcg by mouth daily with breakfast.     cholecalciferol  (VITAMIN D3) 25 MCG (1000 UT) tablet Take 1,000 Units by mouth daily with breakfast.     digoxin  (LANOXIN ) 0.125 MG tablet Take 0.5 tablets (0.0625 mg total) by mouth daily. 15 tablet 3   empagliflozin  (JARDIANCE ) 10 MG TABS tablet Take 1 tablet (10 mg total) by mouth daily. 90 tablet 3   furosemide  (LASIX ) 40 MG tablet Take 1 tablet (40 mg total) by mouth daily. 30 tablet 6   gabapentin  (NEURONTIN ) 100 MG capsule Take 1 capsule (100 mg total) by mouth 2 (two) times daily. 180 capsule 1   ketoconazole  (NIZORAL ) 2 % cream Apply 1 Application topically 2 (two) times daily. 30 g 3  loratadine  (CLARITIN ) 10 MG tablet Take 10 mg by mouth daily.     metFORMIN  (GLUCOPHAGE -XR) 500 MG 24 hr tablet Take 1 tablet (500 mg total) by mouth with evening meal 90 tablet 3   metoprolol  succinate (TOPROL -XL) 25 MG 24 hr tablet Take 1 tablet (25 mg total) by mouth in the morning and at bedtime. 180 tablet 3   multivitamin (ONE-A-DAY MEN'S) TABS tablet Take 1 tablet by mouth daily with breakfast.     Naftifine HCl (NAFTIN) 2 % GEL Apply 1 application  topically every Monday, Wednesday, and Friday.     pantoprazole  (PROTONIX ) 40 MG tablet Take 1 tablet (40 mg total) by mouth 2 (two) times daily. 180 tablet 3   potassium chloride  (KLOR-CON ) 10 MEQ tablet Take 2 tablets (20 mEq total) by mouth daily. 60 tablet 3   sacubitril -valsartan  (ENTRESTO ) 24-26 MG Take 1 tablet by mouth 2 (two) times daily. 180 tablet 1   spironolactone  (ALDACTONE ) 25 MG tablet Take 1 tablet (25 mg total) by mouth daily. 90 tablet 1   Tavaborole 5 % SOLN Apply 1 application  topically daily. Nail on left hand     XARELTO  20 MG TABS tablet TAKE 1 TABLET BY MOUTH EVERY DAY WITH DINNER 90 tablet 1   No current  facility-administered medications for this visit.    PHYSICAL EXAMINATION: ECOG PERFORMANCE STATUS: 2 - Symptomatic, <50% confined to bed  Vitals:   02/20/23 0940  BP: 107/70  Pulse: 61  Resp: 16  Temp: 97.8 F (36.6 C)  SpO2: 100%   Wt Readings from Last 3 Encounters:  02/20/23 178 lb (80.7 kg)  02/01/23 177 lb 3.2 oz (80.4 kg)  12/10/22 180 lb 1.9 oz (81.7 kg)     GENERAL:alert, no distress and comfortable SKIN: skin color, texture, turgor are normal, no rashes or significant lesions EYES: normal, Conjunctiva are pink and non-injected, sclera clear NECK: supple, thyroid  normal size, non-tender, without nodularity LYMPH:  no palpable lymphadenopathy in the cervical, axillary  LUNGS: clear to auscultation and percussion with normal breathing effort HEART: regular rate & rhythm and no murmurs and no lower extremity edema ABDOMEN:abdomen soft, non-tender and normal bowel sounds Musculoskeletal:no cyanosis of digits and no clubbing  NEURO: alert & oriented x 3 with fluent speech, no focal motor/sensory deficits   LABORATORY DATA:  I have reviewed the data as listed    Latest Ref Rng & Units 02/20/2023    9:06 AM 02/01/2023   10:39 AM 12/12/2022    9:45 AM  CBC  WBC 4.0 - 10.5 K/uL 6.2  7.7  5.3   Hemoglobin 13.0 - 17.0 g/dL 89.1  88.0  86.3   Hematocrit 39.0 - 52.0 % 33.0  36.5  40.4   Platelets 150 - 400 K/uL 224  277  197         Latest Ref Rng & Units 02/20/2023    9:06 AM 02/01/2023   10:39 AM 12/12/2022    9:45 AM  CMP  Glucose 70 - 99 mg/dL 897  893  893   BUN 8 - 23 mg/dL 22  28  26    Creatinine 0.61 - 1.24 mg/dL 8.73  8.73  8.72   Sodium 135 - 145 mmol/L 136  134  135   Potassium 3.5 - 5.1 mmol/L 4.1  4.5  4.3   Chloride 98 - 111 mmol/L 104  101  101   CO2 22 - 32 mmol/L 24  22  27    Calcium  8.9 -  10.3 mg/dL 9.4  9.4  9.8   Total Protein 6.5 - 8.1 g/dL 7.3   7.5   Total Bilirubin 0.0 - 1.2 mg/dL 0.7   0.9   Alkaline Phos 38 - 126 U/L 47   42   AST 15  - 41 U/L 20   22   ALT 0 - 44 U/L 15   22       RADIOGRAPHIC STUDIES: I have personally reviewed the radiological images as listed and agreed with the findings in the report. No results found.    Orders Placed This Encounter  Procedures   CT CHEST ABDOMEN PELVIS W CONTRAST    HOLD IV CONTRAST IF gfr<45    Standing Status:   Future    Expected Date:   05/13/2023    Expiration Date:   02/20/2024    If indicated for the ordered procedure, I authorize the administration of contrast media per Radiology protocol:   Yes    Does the patient have a contrast media/X-ray dye allergy ?:   No    Preferred imaging location?:   [facility]    If indicated for the ordered procedure, I authorize the administration of oral contrast media per Radiology protocol:   Yes   All questions were answered. The patient knows to call the clinic with any problems, questions or concerns. No barriers to learning was detected. The total time spent in the appointment was 25 minutes.     Onita Mattock, MD 02/20/2023

## 2023-02-22 ENCOUNTER — Encounter (HOSPITAL_COMMUNITY): Payer: Medicare HMO

## 2023-02-22 ENCOUNTER — Telehealth (HOSPITAL_COMMUNITY): Payer: Self-pay

## 2023-02-22 NOTE — Telephone Encounter (Signed)
 Talked with patient, no classes offered today after 12:30 due to impending weather

## 2023-02-25 ENCOUNTER — Encounter (HOSPITAL_COMMUNITY): Payer: Medicare HMO

## 2023-02-25 ENCOUNTER — Telehealth (HOSPITAL_COMMUNITY): Payer: Self-pay | Admitting: *Deleted

## 2023-02-25 NOTE — Telephone Encounter (Signed)
 Received message this morning will not be attending cardiac rehab today 1/13.

## 2023-02-26 ENCOUNTER — Other Ambulatory Visit (HOSPITAL_COMMUNITY): Payer: Self-pay

## 2023-02-27 ENCOUNTER — Encounter (HOSPITAL_COMMUNITY)
Admission: RE | Admit: 2023-02-27 | Discharge: 2023-02-27 | Disposition: A | Discharge status: Home / Self Care | Payer: Medicare HMO | Source: Ambulatory Visit | Attending: Cardiology | Admitting: Cardiology

## 2023-02-27 ENCOUNTER — Encounter (HOSPITAL_COMMUNITY): Payer: Medicare HMO

## 2023-02-27 VITALS — Ht 66.0 in | Wt 178.4 lb

## 2023-02-27 DIAGNOSIS — C61 Malignant neoplasm of prostate: Secondary | ICD-10-CM | POA: Diagnosis not present

## 2023-02-27 DIAGNOSIS — I5022 Chronic systolic (congestive) heart failure: Secondary | ICD-10-CM

## 2023-02-27 DIAGNOSIS — Z5189 Encounter for other specified aftercare: Secondary | ICD-10-CM | POA: Diagnosis not present

## 2023-02-27 DIAGNOSIS — C169 Malignant neoplasm of stomach, unspecified: Secondary | ICD-10-CM | POA: Diagnosis not present

## 2023-02-27 DIAGNOSIS — I130 Hypertensive heart and chronic kidney disease with heart failure and stage 1 through stage 4 chronic kidney disease, or unspecified chronic kidney disease: Secondary | ICD-10-CM | POA: Diagnosis not present

## 2023-02-28 ENCOUNTER — Other Ambulatory Visit (HOSPITAL_COMMUNITY): Payer: Self-pay | Admitting: Cardiology

## 2023-02-28 MED ORDER — RIVAROXABAN 20 MG PO TABS: 20.0000 mg | ORAL_TABLET | Freq: Every day | ORAL | 1 refills | Status: DC

## 2023-03-01 ENCOUNTER — Encounter (HOSPITAL_COMMUNITY)
Admission: RE | Admit: 2023-03-01 | Discharge: 2023-03-01 | Disposition: A | Discharge status: Home / Self Care | Payer: Medicare HMO | Source: Ambulatory Visit | Attending: Cardiology

## 2023-03-01 ENCOUNTER — Encounter (HOSPITAL_COMMUNITY): Payer: Medicare HMO

## 2023-03-01 DIAGNOSIS — I5022 Chronic systolic (congestive) heart failure: Secondary | ICD-10-CM | POA: Diagnosis not present

## 2023-03-01 DIAGNOSIS — Z5189 Encounter for other specified aftercare: Secondary | ICD-10-CM | POA: Diagnosis not present

## 2023-03-01 DIAGNOSIS — I130 Hypertensive heart and chronic kidney disease with heart failure and stage 1 through stage 4 chronic kidney disease, or unspecified chronic kidney disease: Secondary | ICD-10-CM | POA: Diagnosis not present

## 2023-03-01 DIAGNOSIS — C61 Malignant neoplasm of prostate: Secondary | ICD-10-CM | POA: Diagnosis not present

## 2023-03-01 DIAGNOSIS — C169 Malignant neoplasm of stomach, unspecified: Secondary | ICD-10-CM | POA: Diagnosis not present

## 2023-03-04 ENCOUNTER — Encounter (HOSPITAL_COMMUNITY): Payer: Medicare HMO

## 2023-03-04 ENCOUNTER — Encounter (HOSPITAL_COMMUNITY)
Admission: RE | Admit: 2023-03-04 | Discharge: 2023-03-04 | Disposition: A | Discharge status: Home / Self Care | Payer: Medicare HMO | Source: Ambulatory Visit | Attending: Cardiology | Admitting: Cardiology

## 2023-03-04 DIAGNOSIS — Z5189 Encounter for other specified aftercare: Secondary | ICD-10-CM | POA: Diagnosis not present

## 2023-03-04 DIAGNOSIS — I130 Hypertensive heart and chronic kidney disease with heart failure and stage 1 through stage 4 chronic kidney disease, or unspecified chronic kidney disease: Secondary | ICD-10-CM | POA: Diagnosis not present

## 2023-03-04 DIAGNOSIS — I5022 Chronic systolic (congestive) heart failure: Secondary | ICD-10-CM | POA: Diagnosis not present

## 2023-03-04 DIAGNOSIS — C169 Malignant neoplasm of stomach, unspecified: Secondary | ICD-10-CM | POA: Diagnosis not present

## 2023-03-04 DIAGNOSIS — C61 Malignant neoplasm of prostate: Secondary | ICD-10-CM | POA: Diagnosis not present

## 2023-03-05 DIAGNOSIS — D6869 Other thrombophilia: Secondary | ICD-10-CM | POA: Diagnosis not present

## 2023-03-05 DIAGNOSIS — Z95810 Presence of automatic (implantable) cardiac defibrillator: Secondary | ICD-10-CM | POA: Diagnosis not present

## 2023-03-05 DIAGNOSIS — E1169 Type 2 diabetes mellitus with other specified complication: Secondary | ICD-10-CM | POA: Diagnosis not present

## 2023-03-05 DIAGNOSIS — C61 Malignant neoplasm of prostate: Secondary | ICD-10-CM | POA: Diagnosis not present

## 2023-03-05 DIAGNOSIS — E7800 Pure hypercholesterolemia, unspecified: Secondary | ICD-10-CM | POA: Diagnosis not present

## 2023-03-05 DIAGNOSIS — I4891 Unspecified atrial fibrillation: Secondary | ICD-10-CM | POA: Diagnosis not present

## 2023-03-05 DIAGNOSIS — I428 Other cardiomyopathies: Secondary | ICD-10-CM | POA: Diagnosis not present

## 2023-03-05 DIAGNOSIS — Z23 Encounter for immunization: Secondary | ICD-10-CM | POA: Diagnosis not present

## 2023-03-05 DIAGNOSIS — C169 Malignant neoplasm of stomach, unspecified: Secondary | ICD-10-CM | POA: Diagnosis not present

## 2023-03-06 ENCOUNTER — Telehealth (HOSPITAL_COMMUNITY): Payer: Self-pay | Admitting: *Deleted

## 2023-03-06 ENCOUNTER — Ambulatory Visit (HOSPITAL_COMMUNITY): Payer: Medicare HMO

## 2023-03-06 ENCOUNTER — Encounter (HOSPITAL_COMMUNITY): Payer: Medicare HMO

## 2023-03-06 NOTE — Telephone Encounter (Signed)
Jon Williams called to say he would be absent from cardiac rehab today. He requested a call back regarding completion day.

## 2023-03-07 ENCOUNTER — Ambulatory Visit (HOSPITAL_COMMUNITY): Admit: 2023-03-07 | Payer: Medicare HMO | Admitting: Cardiology

## 2023-03-07 ENCOUNTER — Other Ambulatory Visit (HOSPITAL_COMMUNITY): Payer: Self-pay

## 2023-03-07 ENCOUNTER — Encounter (HOSPITAL_COMMUNITY): Payer: Self-pay

## 2023-03-07 SURGERY — RIGHT HEART CATH
Anesthesia: LOCAL

## 2023-03-08 ENCOUNTER — Encounter (HOSPITAL_COMMUNITY)
Admission: RE | Admit: 2023-03-08 | Discharge: 2023-03-08 | Disposition: A | Discharge status: Home / Self Care | Payer: Medicare HMO | Source: Ambulatory Visit | Attending: Cardiology

## 2023-03-08 DIAGNOSIS — I130 Hypertensive heart and chronic kidney disease with heart failure and stage 1 through stage 4 chronic kidney disease, or unspecified chronic kidney disease: Secondary | ICD-10-CM | POA: Diagnosis not present

## 2023-03-08 DIAGNOSIS — Z5189 Encounter for other specified aftercare: Secondary | ICD-10-CM | POA: Diagnosis not present

## 2023-03-08 DIAGNOSIS — C61 Malignant neoplasm of prostate: Secondary | ICD-10-CM | POA: Diagnosis not present

## 2023-03-08 DIAGNOSIS — I5022 Chronic systolic (congestive) heart failure: Secondary | ICD-10-CM | POA: Diagnosis not present

## 2023-03-08 DIAGNOSIS — C169 Malignant neoplasm of stomach, unspecified: Secondary | ICD-10-CM | POA: Diagnosis not present

## 2023-03-08 NOTE — Progress Notes (Signed)
Discharge Progress Report  Patient Details  Name: Jon Williams MRN: [id number] Date of Birth: 23-Feb-1950 Referring Provider:   Flowsheet Row INTENSIVE CARDIAC REHAB ORIENT from 12/10/2022 in [facility]  Referring Provider Marca Ancona, MD        Number of Visits: 9  Reason for Discharge:  Patient reached a stable level of exercise. Patient independent in their exercise. Patient has met program and personal goals.  Smoking History:  Social History   Tobacco Use  Smoking Status Never  Smokeless Tobacco Never    Diagnosis:  Heart failure, chronic systolic (HCC)  ADL [facility]:   Initial Exercise Prescription:  Initial Exercise Prescription - 12/10/22 1500       Date of Initial Exercise RX and Referring Provider   Date 12/10/22    Referring Provider Marca Ancona, MD    Expected Discharge Date 03/06/23      NuStep   Level 1    SPM 70    Minutes 15    METs 2      Prescription Details   Frequency (times per week) 3    Duration Progress to 30 minutes of continuous aerobic without signs/symptoms of physical distress      Intensity   THRR 40-80% of Max Heartrate 58-117    Ratings of Perceived Exertion 11-13    Perceived Dyspnea 0-4      Progression   Progression Continue progressive overload as per policy without signs/symptoms or physical distress.      Resistance Training   Training Prescription Yes    Weight 2    Reps 10-15             Discharge Exercise Prescription (Final Exercise Prescription Changes):  Exercise Prescription Changes - 03/08/23 1400       Response to Exercise   Blood Pressure (Admit) 100/60    Blood Pressure (Exit) 102/64    Heart Rate (Admit) 78 bpm    Heart Rate (Exercise) 112 bpm    Heart Rate (Exit) 87 bpm    Rating of Perceived Exertion (Exercise) 11    Symptoms None    Comments Pt graduated from the CRP2 program today    Duration Continue with 30 min of  aerobic exercise without signs/symptoms of physical distress.    Intensity THRR unchanged      Progression   Progression Continue to progress workloads to maintain intensity without signs/symptoms of physical distress.    Average METs 2.8      Resistance Training   Training Prescription Yes    Weight 3 lbs    Reps 10-15    Time 10 Minutes      Interval Training   Interval Training No      NuStep   Level 3    SPM 95    Minutes 30    METs 2.8      Home Exercise Plan   Plans to continue exercise at Home (comment)    Frequency Add 2 additional days to program exercise sessions.    Initial Home Exercises Provided 02/15/23             Functional Capacity:  6 Minute Walk     Row Name 12/10/22 1522 02/27/23 1258       6 Minute Walk   Phase Initial Discharge    Distance 1150 feet  used go-cart 1269 feet    Distance % Change -- 10.35 %    Distance Feet  Change -- 119 ft    Walk Time 6 minutes 6 minutes    # of Rest Breaks 0 0    MPH 2.18 2.4    METS 2.43 2.53    RPE 9 11    Perceived Dyspnea  0 0    VO2 Peak 8.49 8.84    Symptoms Yes (comment) No    Comments no pain, legs fatigued --    Resting HR 64 bpm 68 bpm    Resting BP 108/70 98/66    Resting Oxygen Saturation  98 % --    Exercise Oxygen Saturation  during 6 min walk 99 % --    Max Ex. HR 109 bpm 106 bpm    Max Ex. BP 134/70 122/68    2 Minute Post BP 113/86 --             Psychological, QOL, Others - Outcomes: PHQ 2/9:    03/08/2023    1:25 PM 12/10/2022    2:12 PM 12/03/2019    5:46 PM 04/02/2017    2:20 PM  Depression screen PHQ 2/9  Decreased Interest 0 0 0   Down, Depressed, Hopeless 0 0 0   PHQ - 2 Score 0 0 0   Altered sleeping 0 0    Tired, decreased energy 0 0    Change in appetite 0 0    Feeling bad or failure about yourself  0 0    Trouble concentrating 0 0    Moving slowly or fidgety/restless 0 0    Suicidal thoughts 0 0    PHQ-9 Score 0 0    Difficult doing work/chores Not  difficult at all        Information is confidential and restricted. Go to Review Flowsheets to unlock data.    Quality of Life:  Quality of Life - 03/01/23 1636       Quality of Life Scores   Health/Function Pre 26.23 %    Health/Function Post 28.8 %    Health/Function % Change 9.8 %    Socioeconomic Pre 29 %    Socioeconomic Post 27.56 %    Socioeconomic % Change  -4.97 %    Psych/Spiritual Pre 24.86 %    Psych/Spiritual Post 30 %    Psych/Spiritual % Change 20.68 %    Family Pre 30 %    Family Post 28.8 %    Family % Change -4 %    GLOBAL Pre 27.02 %    GLOBAL Post 28.76 %    GLOBAL % Change 6.44 %             Personal Goals: Goals established at orientation with interventions provided to work toward goal.  Personal Goals and Risk Factors at Admission - 12/10/22 1403       Core Components/Risk Factors/Patient Goals on Admission    Weight Management Yes    Intervention Weight Management: Develop a combined nutrition and exercise program designed to reach desired caloric intake, while maintaining appropriate intake of nutrient and fiber, sodium and fats, and appropriate energy expenditure required for the weight goal.;Weight Management: Provide education and appropriate resources to help participant work on and attain dietary goals.    Expected Outcomes Short Term: Continue to assess and modify interventions until short term weight is achieved;Long Term: Adherence to nutrition and physical activity/exercise program aimed toward attainment of established weight goal;Understanding recommendations for meals to include 15-35% energy as protein, 25-35% energy from fat, 35-60% energy from carbohydrates, less than  200mg  of dietary cholesterol, 20-35 gm of total fiber daily;Understanding of distribution of calorie intake throughout the day with the consumption of 4-5 meals/snacks    Diabetes Yes    Intervention Provide education about signs/symptoms and action to take for  hypo/hyperglycemia.;Provide education about proper nutrition, including hydration, and aerobic/resistive exercise prescription along with prescribed medications to achieve blood glucose in normal ranges: Fasting glucose 65-99 mg/dL    Expected Outcomes Short Term: Participant verbalizes understanding of the signs/symptoms and immediate care of hyper/hypoglycemia, proper foot care and importance of medication, aerobic/resistive exercise and nutrition plan for blood glucose control.;Long Term: Attainment of HbA1C < 7%.    Heart Failure Yes    Intervention Provide a combined exercise and nutrition program that is supplemented with education, support and counseling about heart failure. Directed toward relieving symptoms such as shortness of breath, decreased exercise tolerance, and extremity edema.    Expected Outcomes Improve functional capacity of life;Short term: Attendance in program 2-3 days a week with increased exercise capacity. Reported lower sodium intake. Reported increased fruit and vegetable intake. Reports medication compliance.;Short term: Daily weights obtained and reported for increase. Utilizing diuretic protocols set by physician.;Long term: Adoption of self-care skills and reduction of barriers for early signs and symptoms recognition and intervention leading to self-care maintenance.    Hypertension Yes    Intervention Monitor prescription use compliance.;Provide education on lifestyle modifcations including regular physical activity/exercise, weight management, moderate sodium restriction and increased consumption of fresh fruit, vegetables, and low fat dairy, alcohol moderation, and smoking cessation.    Expected Outcomes Short Term: Continued assessment and intervention until BP is < 140/46mm HG in hypertensive participants. < 130/16mm HG in hypertensive participants with diabetes, heart failure or chronic kidney disease.;Long Term: Maintenance of blood pressure at goal levels.    Lipids  Yes    Intervention Provide education and support for participant on nutrition & aerobic/resistive exercise along with prescribed medications to achieve LDL 70mg , HDL >40mg .    Expected Outcomes Short Term: Participant states understanding of desired cholesterol values and is compliant with medications prescribed. Participant is following exercise prescription and nutrition guidelines.;Long Term: Cholesterol controlled with medications as prescribed, with individualized exercise RX and with personalized nutrition plan. Value goals: LDL < 70mg , HDL > 40 mg.              Personal Goals Discharge:  Goals and Risk Factor Review     Row Name 12/20/22 0856 01/01/23 1314 01/23/23 1705 02/18/23 1654 03/14/23 1259     Core Components/Risk Factors/Patient Goals Review   Personal Goals Review Weight Management/Obesity;Lipids;Diabetes;Heart Failure;Hypertension Weight Management/Obesity;Lipids;Diabetes;Heart Failure;Hypertension Weight Management/Obesity;Lipids;Diabetes;Heart Failure;Hypertension Weight Management/Obesity;Lipids;Diabetes;Heart Failure;Hypertension Weight Management/Obesity;Lipids;Diabetes;Heart Failure;Hypertension   Review Cassey  started cardiac rehab on 12/19/22. Lula did fair with exercise for his fitness level. Brennen was hypotensive post exercise. Asymptomatic. Given water. CBG's were WNL. Zalman  started cardiac rehab on 12/19/22. Hermilo is off to a good start to  exercise Shanna is doing well with exercise at  cardiac rehab. vital signs have been stable Sovereign is doing well with exercise at  cardiac rehab. vital signs have been stable. Kross will complete cardiac rehab on 03/01/23 Gaylin did well with exercise at  cardiac rehab. vital signs were stable. Wilberto will completed cardiac rehab on 03/08/23   Expected Outcomes Deklen will continue to participate in cardiac rehab for exercise, nutrition and lifestyle modifications. Allon will continue to participate in cardiac rehab for  exercise, nutrition and lifestyle modifications. Odilon will continue to participate in  cardiac rehab for exercise, nutrition and lifestyle modifications. Aziah will continue to participate in cardiac rehab for exercise, nutrition and lifestyle modifications. Guilherme will continue to participate in cardiac rehab for exercise, nutrition and lifestyle modifications.            Exercise Goals and Review:  Exercise Goals     Row Name 12/10/22 1328             Exercise Goals   Increase Physical Activity Yes       Intervention Provide advice, education, support and counseling about physical activity/exercise needs.;Develop an individualized exercise prescription for aerobic and resistive training based on initial evaluation findings, risk stratification, comorbidities and participant's personal goals.       Expected Outcomes Short Term: Attend rehab on a regular basis to increase amount of physical activity.;Long Term: Exercising regularly at least 3-5 days a week.;Long Term: Add in home exercise to make exercise part of routine and to increase amount of physical activity.       Increase Strength and Stamina Yes       Intervention Provide advice, education, support and counseling about physical activity/exercise needs.;Develop an individualized exercise prescription for aerobic and resistive training based on initial evaluation findings, risk stratification, comorbidities and participant's personal goals.       Expected Outcomes Short Term: Increase workloads from initial exercise prescription for resistance, speed, and METs.;Short Term: Perform resistance training exercises routinely during rehab and add in resistance training at home;Long Term: Improve cardiorespiratory fitness, muscular endurance and strength as measured by increased METs and functional capacity ( )       Able to understand and use rate of perceived exertion (RPE) scale Yes       Intervention Provide education and  explanation on how to use RPE scale       Expected Outcomes Short Term: Able to use RPE daily in rehab to express subjective intensity level;Long Term:  Able to use RPE to guide intensity level when exercising independently       Knowledge and understanding of Target Heart Rate Range (THRR) Yes       Intervention Provide education and explanation of THRR including how the numbers were predicted and where they are located for reference       Expected Outcomes Short Term: Able to state/look up THRR;Short Term: Able to use daily as guideline for intensity in rehab;Long Term: Able to use THRR to govern intensity when exercising independently       Understanding of Exercise Prescription Yes       Intervention Provide education, explanation, and written materials on patient's individual exercise prescription       Expected Outcomes Short Term: Able to explain program exercise prescription;Long Term: Able to explain home exercise prescription to exercise independently                Exercise Goals Re-Evaluation:  Exercise Goals Re-Evaluation     Row Name 12/19/22 1500 01/18/23 1557 01/18/23 1619 02/20/23 1443 03/08/23 1434     Exercise Goal Re-Evaluation   Exercise Goals Review Increase Physical Activity;Understanding of Exercise Prescription;Increase Strength and Stamina;Knowledge and understanding of Target Heart Rate Range (THRR);Able to understand and use rate of perceived exertion (RPE) scale -- Increase Physical Activity;Understanding of Exercise Prescription;Increase Strength and Stamina;Knowledge and understanding of Target Heart Rate Range (THRR);Able to understand and use rate of perceived exertion (RPE) scale Increase Physical Activity;Understanding of Exercise Prescription;Increase Strength and Stamina;Knowledge and understanding of Target Heart Rate Range (THRR);Able to understand  and use rate of perceived exertion (RPE) scale Increase Physical Activity;Understanding of Exercise  Prescription;Increase Strength and Stamina;Knowledge and understanding of Target Heart Rate Range (THRR);Able to understand and use rate of perceived exertion (RPE) scale   Comments Pt's first day in the CRP2 program. Pt understnads the exercise Rx, RPE sclae and THRR -- Reviewed METs and goals. Pt voices progress on all his goals: increased strength and stamina, being more independent, improve blance and walk better. Peak METs to date are 3.1. Reviewed goals. Pt voices that his balance is better and his legs are stonger. Pt is exercising daily at home in addtion to the CRP2 program. Pt graduated from the CRP2 program today. Peak METs were 2.8. Pt made good progress on his goals of improving his leg strength and balance. Pt haas been exercisng at home on his off days on the stationary bike, 30 minutes. Pt will continue ths and walking at home. Pt is also doing stretching and free weights.   Expected Outcomes Will continue to monitor patient and progress exercise workloads as tolerated, -- Will continue to monitor patient and progress exercise workloads as tolerated. Will continue to monitor patient and progress exercise workloads as tolerated. Pt will continue his exercise at home.            Nutrition & Weight - Outcomes:  Pre Biometrics - 12/10/22 1356       Pre Biometrics   Waist Circumference 40.5 inches    Hip Circumference 40 inches    Waist to Hip Ratio 1.01 %    Triceps Skinfold 20 mm    % Body Fat 29.9 %    Grip Strength 24 kg    Flexibility 14 in    Single Leg Stand 0 seconds   did not attempt, pt has poor balance            Post Biometrics - 02/27/23 1220        Post  Biometrics   Height 5\' 6"  (1.676 m)    Weight 80.9 kg    Waist Circumference 39 inches    Hip Circumference 40 inches    Waist to Hip Ratio 0.98 %    BMI (Calculated) 28.8    Triceps Skinfold 19 mm    % Body Fat 29.8 %    Grip Strength 24 kg    Flexibility 14 in    Single Leg Stand 3.5 seconds              Nutrition:  Nutrition Therapy & Goals - 12/18/22 1123       Nutrition Therapy   Diet Heart Healthy Diet    Drug/Food Interactions Statins/Certain Fruits      Personal Nutrition Goals   Nutrition Goal Patient to identify strategies for reducing cardiovascular risk by attending the [employer] education and nutrition series weekly.    Personal Goal #2 Patient to improve diet quality by using the plate method as a guide for meal planning to include lean protein/plant protein, fruits, vegetables, whole grains, nonfat dairy as part of a well-balanced diet    Personal Goal #3 Patient to limit sodium intake to 1500mg  per day    Comments Patient has medical history of CHF, DM2, ICD in place, history of gastric cancer. He continues regular follow-up with oncology. His A1c is well controlled in a pre-diabetic range. Patient will benefit from participation in intensive cardiac rehab for nutrition, exercise, and lifestyle modification.      Intervention Plan   Intervention Prescribe, educate  and counsel regarding individualized specific dietary modifications aiming towards targeted core components such as weight, hypertension, lipid management, diabetes, heart failure and other comorbidities.;Nutrition handout(s) given to patient.    Expected Outcomes Short Term Goal: Understand basic principles of dietary content, such as calories, fat, sodium, cholesterol and nutrients.;Long Term Goal: Adherence to prescribed nutrition plan.             Nutrition Discharge:   Education Questionnaire Score:  Knowledge Questionnaire Score - 03/08/23 1500       Knowledge Questionnaire Score   Post Score 23/24             Goals reviewed with patient; copy given to patient. German graduated from  [employer] cardiac rehab program on 03/08/23  with completion of  28 exercise and  21 education sessions. Pt maintained good attendance and progressed nicely during their participation in  rehab as evidenced by increased MET level. Miriam increased his distance on his post exercise walk test by 119 feet and lost 2.3 kg.  Medication list reconciled. Repeat  PHQ score- 0 .  Pt has made significant lifestyle changes and should be commended for their success. Woodroe achieved their goals during cardiac rehab.   Pt plans to continue exercise at home walking and stretching and bands and ride his stationary bike. We are proud of Ajay's progress!Thayer Headings RN BSN

## 2023-03-12 ENCOUNTER — Other Ambulatory Visit (HOSPITAL_COMMUNITY): Payer: Self-pay

## 2023-03-13 NOTE — Progress Notes (Signed)
ADVANCED HF CLINIC NOTE   Primary Care: Jon Dills, MD Primary Cardiologist: Jon Miss, MD HF Cardiologist: Dr. Shirlee Williams  HPI: Jon Williams is a 73 y.o. with a history of chronic systolic heart failure, St Jude ICD, permanent atrial fibrillation, CAD, DMII, LBBB, HTN, gastric cancer 2022, and prostate cancer 2019.    Cath 2018 showed mild to moderate non-obstructive CAD. Fick CI 1.9.    Echo 10/22 showed EF 20-25%, mod-severe Jon, mod TR, RV nl   Echo 2/23 showed EF 20-25%, mild Jon, RV not well visualized   Admitted 8/23 with HF. Diuresed with IV lasix and GDMT adjusted. Echo showed EF 20-25%, RV mildly reduced, LA severely dilated, moderate-severe Jon.    Had follow up with Dr Jon Williams 09/2022. CT scan no evidence of recurrent cancer. Plan for repeat CT in 8 months.    Admitted 9/24 with CHF. Echo showed EF 10-15%, G3DD, RV mildly reduced. R/LHC showed nonobstructive CAD, normal filling pressures, and low cardiac index. Unable to get cMRI as device not compatible. GDMT titrated and he was discharged home, weight 183 lbs.  Echo 12/24 EF 20% with severe LV dilation, normal RV, severe LAE, moderate Jon, IVC normal.   He was scheduled for RHC but called and cancelled.  Today he returns for HF follow up with wife. Overall feeling fine. Finished CR and feeling well. Doing exercises at home. He has SOB walking further distances on flat ground. Denies palpitations, abnormal bleeding, CP, dizziness, edema, or PND/Orthopnea. Appetite ok. No fever or chills. Weight at home 174 pounds. Taking all medications. Not interested in RHC or completing sleep study.  ECG (personally reviewed): none ordered today  St Jude device interrogation (personally reviewed): unable to interrogate device in clinic  Labs (9/24): K 3.7, creatinine 1.13, Lp(a) 184 Labs (10/24): K 4.3, creatinine 1.27, hgb 13.6 Labs (1/25): 4.1, creatinine 1.26  PMH: 1. H/o gastric cancer.  2. H/o prostate cancer.  3. Type 2  DM 4. Atrial fibrillation: Permanent 5. Chronic systolic CHF: Nonischemic cardiomyopathy. St Jude CRT-D.  - R/LHC (2018): Mild to moderate non obstructive CAD; RA 8, PA 42/20 (29), PCWP 17, CO/CI (Fick) 3.76/ 1.9  - Echo (10/22): EF 20-25%, mod-severe Jon, mod TR, RV normal - Echo (2/23): EF 20-25%, mild Jon, RV not well visualized - Echo (8/24): EF 20-25%, RV mildly reduced, LA severely dilated, moderate to severe Jon - R/LHC (9/24): non obstructive CAD; RA 1, PA 28/10 (17), PCWP 8, Fick CI 2.16, thermo CI 1.84.  - Echo (9/24): EF 10-15%, severe LV dilation, mild RV dysfunction with moderate RV enlargement, moderate-severe Jon.  - Echo (12/24): EF 20% with severe LV dilation, normal RV, severe LAE, moderate Jon, IVC normal.   FH: mother CVA, MI  Review of Systems: All systems reviewed and negative except as per HPI.   Current Outpatient Medications  Medication Sig Dispense Refill   atorvastatin (LIPITOR) 40 MG tablet Take 1 tablet (40 mg total) by mouth daily. 90 tablet 1   Biotin 1000 MCG tablet Take 1,000 mcg by mouth daily with breakfast.     cholecalciferol (VITAMIN D3) 25 MCG (1000 UT) tablet Take 1,000 Units by mouth daily with breakfast.     digoxin (LANOXIN) 0.125 MG tablet Take 1/2 tablet (0.0625mg  total) by mouth daily. 15 tablet 3   empagliflozin (JARDIANCE) 10 MG TABS tablet Take 1 tablet (10 mg total) by mouth daily. 90 tablet 3   furosemide (LASIX) 40 MG tablet Take 1 tablet (40 mg total) by  mouth daily. 30 tablet 6   gabapentin (NEURONTIN) 100 MG capsule Take 1 capsule (100 mg total) by mouth 2 (two) times daily. 180 capsule 1   ketoconazole (NIZORAL) 2 % cream Apply 1 Application topically 2 (two) times daily. (Patient taking differently: Apply 1 Application topically 2 (two) times daily. As needed) 30 g 3   loratadine (CLARITIN) 10 MG tablet Take 10 mg by mouth daily.     metFORMIN (GLUCOPHAGE-XR) 500 MG 24 hr tablet Take 1 tablet (500 mg total) by mouth with evening meal 90  tablet 3   metoprolol succinate (TOPROL-XL) 25 MG 24 hr tablet Take 1 tablet (25 mg total) by mouth in the morning and at bedtime. 180 tablet 3   multivitamin (ONE-A-DAY MEN'S) TABS tablet Take 1 tablet by mouth daily with breakfast.     Naftifine HCl (NAFTIN) 2 % GEL Apply 1 application  topically every Monday, Wednesday, and Friday.     pantoprazole (PROTONIX) 40 MG tablet Take 1 tablet (40 mg total) by mouth 2 (two) times daily. 180 tablet 3   potassium chloride (KLOR-CON) 10 MEQ tablet Take 2 tablets (20 mEq total) by mouth daily. 60 tablet 3   rivaroxaban (XARELTO) 20 MG TABS tablet Take 1 tablet (20 mg total) by mouth daily with supper. 90 tablet 1   sacubitril-valsartan (ENTRESTO) 24-26 MG Take 1 tablet by mouth 2 (two) times daily. 180 tablet 1   spironolactone (ALDACTONE) 25 MG tablet Take 1 tablet (25 mg total) by mouth daily. 90 tablet 1   Tavaborole 5 % SOLN Apply 1 application  topically daily. Nail on left hand     No current facility-administered medications for this encounter.   Allergies  Allergen Reactions   Aspirin Anaphylaxis and Hives   Sulfa Antibiotics Anaphylaxis, Hives, Swelling and Other (See Comments)    Swollen lips   Social History   Socioeconomic History   Marital status: Married    Spouse name: Not on file   Number of children: 2   Years of education: Not on file   Highest education level: Not on file  Occupational History   Occupation: retired  Tobacco Use   Smoking status: Never   Smokeless tobacco: Never  Vaping Use   Vaping status: Never Used  Substance and Sexual Activity   Alcohol use: No   Drug use: No   Sexual activity: Not Currently  Other Topics Concern   Not on file  Social History Narrative   Retired Location manager. Married to Mrs. Jon Williams. Daughter, Jon Williams, lives in [place]. Son, Jon Williams, lives in [place].   Social Drivers of Corporate investment banker Strain: Low Risk  (12/26/2021)   Overall Financial Resource  Strain (CARDIA)    Difficulty of Paying Living Expenses: Not very hard  Food Insecurity: No Food Insecurity (10/29/2022)   Hunger Vital Sign    Worried About Running Out of Food in the Last Year: Never true    Ran Out of Food in the Last Year: Never true  Transportation Needs: No Transportation Needs (10/29/2022)   PRAPARE - Administrator, Civil Service (Medical): No    Lack of Transportation (Non-Medical): No  Physical Activity: Insufficiently Active (09/26/2021)   Exercise Vital Sign    Days of Exercise per Week: 7 days    Minutes of Exercise per Session: 10 min  Stress: No Stress Concern Present (09/26/2021)   [employer] of Occupational Health - Occupational Stress Questionnaire    Feeling of  Stress : Not at all  Social Connections: Not on file  Intimate Partner Violence: Not At Risk (10/29/2022)   Humiliation, Afraid, Rape, and Kick questionnaire    Fear of Current or Ex-Partner: No    Emotionally Abused: No    Physically Abused: No    Sexually Abused: No   BP 96/68   Pulse (!) 47   Wt 80.8 kg (178 lb 3.2 oz)   SpO2 91%   BMI 28.76 kg/m   Wt Readings from Last 3 Encounters:  03/15/23 80.8 kg (178 lb 3.2 oz)  02/27/23 80.9 kg (178 lb 5.6 oz)  02/20/23 80.7 kg (178 lb)   PHYSICAL EXAM: General:  NAD. No resp difficulty, walked into clinic HEENT: Normal Neck: Supple. No JVD.  Cor: Irregular rate & rhythm. No rubs, gallops or murmurs. Lungs: Clear Abdomen: Soft, nontender, nondistended. Extremities: No cyanosis, clubbing, rash, edema Neuro: Alert & oriented. Moves all 4 extremities w/o difficulty. Affect pleasant.  ASSESSMENT & PLAN: 1. Chronic systolic CHF: Long-standing cardiomyopathy, nonischemic.  St Jude CRT-D device. Device is not MRI compatible. Cath in 2018 with nonobstructive CAD but CI at that time was low at 1.9.  Echo in 8/23 showed EF 20-25%, mild RV dysfunction, moderate-severe Jon.  Admitted 9/24 with CHF. Echo showed EF 10-15%, G3DD, RV  mildly reduced. R/LHC showed non-obstructive CAD, normal filling pressures and low CI (2.16 Fick/1.84 thermo). Echo 12/24 showed EF 20% with severe LV dilation, normal RV, severe LAE, moderate Jon, IVC normal. Despite history of low output HF, he actually reports minimal symptoms (NYHA class II) and is not volume overloaded on exam or by [place].  - Based on low output on prior RHC and markedly low EF, would consider him for LVAD.  We discussed this today.  However, per his report he is minimally symptomatic. Recommended RHC to see if hemodynamics improved. Previously arranged for RHC, but he cancelled. We discussed rationale for this again today in detail, and he and wife are agreeable. I would also like to get a CPX for objective measurement of his functional capacity but unable to get this currently as we do not have an exercise tech.  Informed Consent   Shared Decision Making/Informed Consent The risks [stroke (1 in 1000), death (1 in 1000), kidney failure [usually temporary] (1 in 500), bleeding (1 in 200), allergic reaction [possibly serious] (1 in 200)], benefits (diagnostic support and management of coronary artery disease) and alternatives of a cardiac catheterization were discussed in detail with Jon. [place] and he is willing to proceed.     - Continue digoxin. Dig level 0.5 (01/2023) - Continue Lasix 40 mg daily. BMET and BNP today. - Continue Entresto 24/26 mg bid, no BP room to increase.  - Continue spironolactone 25 mg daily. - Continue Jardiance 10 mg daily - With bradycardia & soft BP, decrease Toprol XL back to 25 mg daily. - He has graduated CR. Encouraged him to keep up with home exercises. 2. Atrial fibrillation: This is permanent.   - Continue Xarelto.   3. Gastric cancer: Treated, now being monitored.  - He c/o neuropathy symptoms from his treatments. 4. Type 2 diabetes: Per PCP.  - No change. Continue SGLT2i. No GU symptoms. 5. OSA: Suspected. He declines sleep  study.  Follow up after RHC.  Anderson Malta [facility] FNP-BC 03/15/23

## 2023-03-15 ENCOUNTER — Encounter (HOSPITAL_COMMUNITY): Payer: Self-pay

## 2023-03-15 ENCOUNTER — Ambulatory Visit (HOSPITAL_COMMUNITY)
Admission: RE | Admit: 2023-03-15 | Discharge: 2023-03-15 | Disposition: A | Discharge status: Home / Self Care | Payer: Medicare HMO | Source: Ambulatory Visit | Attending: Family Medicine

## 2023-03-15 ENCOUNTER — Other Ambulatory Visit (HOSPITAL_COMMUNITY): Payer: Self-pay

## 2023-03-15 VITALS — BP 96/68 | HR 47 | Wt 178.2 lb

## 2023-03-15 DIAGNOSIS — I447 Left bundle-branch block, unspecified: Secondary | ICD-10-CM | POA: Diagnosis not present

## 2023-03-15 DIAGNOSIS — Z7984 Long term (current) use of oral hypoglycemic drugs: Secondary | ICD-10-CM | POA: Diagnosis not present

## 2023-03-15 DIAGNOSIS — Z8546 Personal history of malignant neoplasm of prostate: Secondary | ICD-10-CM | POA: Diagnosis not present

## 2023-03-15 DIAGNOSIS — Z85028 Personal history of other malignant neoplasm of stomach: Secondary | ICD-10-CM | POA: Insufficient documentation

## 2023-03-15 DIAGNOSIS — I110 Hypertensive heart disease with heart failure: Secondary | ICD-10-CM | POA: Diagnosis not present

## 2023-03-15 DIAGNOSIS — I4821 Permanent atrial fibrillation: Secondary | ICD-10-CM | POA: Insufficient documentation

## 2023-03-15 DIAGNOSIS — Z79899 Other long term (current) drug therapy: Secondary | ICD-10-CM | POA: Diagnosis not present

## 2023-03-15 DIAGNOSIS — I5022 Chronic systolic (congestive) heart failure: Secondary | ICD-10-CM | POA: Diagnosis not present

## 2023-03-15 DIAGNOSIS — I2510 Atherosclerotic heart disease of native coronary artery without angina pectoris: Secondary | ICD-10-CM | POA: Diagnosis not present

## 2023-03-15 DIAGNOSIS — R0602 Shortness of breath: Secondary | ICD-10-CM | POA: Insufficient documentation

## 2023-03-15 DIAGNOSIS — I428 Other cardiomyopathies: Secondary | ICD-10-CM | POA: Diagnosis not present

## 2023-03-15 DIAGNOSIS — E119 Type 2 diabetes mellitus without complications: Secondary | ICD-10-CM | POA: Diagnosis not present

## 2023-03-15 DIAGNOSIS — R29818 Other symptoms and signs involving the nervous system: Secondary | ICD-10-CM | POA: Diagnosis not present

## 2023-03-15 DIAGNOSIS — I482 Chronic atrial fibrillation, unspecified: Secondary | ICD-10-CM

## 2023-03-15 LAB — CBC
HCT: 32.6 % — ABNORMAL LOW (ref 39.0–52.0)
Hemoglobin: 10.4 g/dL — ABNORMAL LOW (ref 13.0–17.0)
MCH: 28.6 pg (ref 26.0–34.0)
MCHC: 31.9 g/dL (ref 30.0–36.0)
MCV: 89.6 fL (ref 80.0–100.0)
Platelets: 256 10*3/uL (ref 150–400)
RBC: 3.64 MIL/uL — ABNORMAL LOW (ref 4.22–5.81)
RDW: 16.2 % — ABNORMAL HIGH (ref 11.5–15.5)
WBC: 6.2 10*3/uL (ref 4.0–10.5)
nRBC: 0 % (ref 0.0–0.2)

## 2023-03-15 LAB — BASIC METABOLIC PANEL
Anion gap: 9 (ref 5–15)
BUN: 17 mg/dL (ref 8–23)
CO2: 23 mmol/L (ref 22–32)
Calcium: 9.2 mg/dL (ref 8.9–10.3)
Chloride: 104 mmol/L (ref 98–111)
Creatinine, Ser: 1.17 mg/dL (ref 0.61–1.24)
GFR, Estimated: >60 mL/min (ref >60)
Glucose, Bld: 101 mg/dL — ABNORMAL HIGH (ref 70–99)
Potassium: 4.3 mmol/L (ref 3.5–5.1)
Sodium: 136 mmol/L (ref 135–145)

## 2023-03-15 MED ORDER — DIGOXIN 125 MCG PO TABS
0.0625 mg | ORAL_TABLET | Freq: Every day | ORAL | 6 refills | Status: DC
  Filled 2023-03-15 – 2023-03-29 (×2): qty 15, 30d supply, fill #0
  Filled 2023-04-23: qty 15, 30d supply, fill #1
  Filled 2023-06-09: qty 15, 30d supply, fill #2
  Filled 2023-07-11: qty 15, 30d supply, fill #3
  Filled 2023-08-07: qty 15, 30d supply, fill #4
  Filled 2023-09-02: qty 15, 30d supply, fill #5
  Filled 2023-10-08: qty 15, 30d supply, fill #6

## 2023-03-15 MED ORDER — SPIRONOLACTONE 25 MG PO TABS
25.0000 mg | ORAL_TABLET | Freq: Every day | ORAL | 3 refills | Status: DC
  Filled 2023-03-15 – 2023-06-09 (×2): qty 90, 90d supply, fill #0
  Filled 2023-09-02: qty 90, 90d supply, fill #1

## 2023-03-15 MED ORDER — RIVAROXABAN 20 MG PO TABS
20.0000 mg | ORAL_TABLET | Freq: Every day | ORAL | 3 refills | Status: AC
  Filled 2023-03-15: qty 90, 90d supply, fill #0
  Filled 2023-06-19: qty 90, 90d supply, fill #1
  Filled 2023-10-21: qty 90, 90d supply, fill #2
  Filled 2024-01-24: qty 90, 90d supply, fill #3

## 2023-03-15 MED ORDER — METOPROLOL SUCCINATE ER 25 MG PO TB24
25.0000 mg | ORAL_TABLET | Freq: Every day | ORAL | 3 refills | Status: AC
  Filled 2023-03-15 – 2023-05-20 (×2): qty 90, 90d supply, fill #0
  Filled 2023-09-04: qty 90, 90d supply, fill #1
  Filled 2024-03-04: qty 90, 90d supply, fill #2

## 2023-03-15 NOTE — Patient Instructions (Signed)
Thank you for coming in today  If you had labs drawn today, any labs that are abnormal the clinic will call you No news is good news  Medications: Decrease Toprol XL to 25 mg 1 tablet daily  Follow up appointments:  Your physician recommends that you schedule a follow-up appointment in:  2 months With Dr. Shirlee Latch    [name] [address] [place] [address] Dept: [phone] Loc: [phone]  Jon Williams  03/15/2023  You are scheduled for a Cardiac Catheterization on Monday, February 10 with Dr. Marca Ancona.  1. Please arrive at the [facility] (Main Entrance A) at [facility]: [address] [place], [place] [address] at 8:30 AM (This time is 2 hour(s) before your procedure to ensure your preparation).   Free valet parking service is available. You will check in at ADMITTING. The support person will be asked to wait in the waiting room.  It is OK to have someone drop you off and come back when you are ready to be discharged.    Special note: Every effort is made to have your procedure done on time. Please understand that emergencies sometimes delay scheduled procedures.  2. Diet: Do not eat solid foods after midnight.  The patient may have clear liquids until 5am upon the day of the procedure.   4. Medication instructions in preparation for your procedure:   Contrast Allergy: No    Stop taking Xarelto (Rivaroxaban) on Saturday, February 8.    Do not take Diabetes Med Glucophage (Metformin) on the day of the procedure and HOLD 48 HOURS AFTER THE PROCEDURE.  On the morning of your procedure, take your morning medicines NOT listed above.  You may use sips of water.  5. Plan to go home the same day, you will only stay overnight if medically necessary. 6. Bring a current list of your medications and current insurance cards. 7. You MUST have a responsible person to drive  you home. 8. Someone MUST be with you the first 24 hours after you arrive home or your discharge will be delayed. 9. Please wear clothes that are easy to get on and off and wear slip-on shoes.  Thank you for allowing [place] to care for you!    -- [facility]    Do the following things EVERYDAY: Weigh yourself in the morning before breakfast. Write it down and keep it in a log. Take your medicines as prescribed Eat low salt foods--Limit salt (sodium) to 2000 mg per day.  Stay as active as you can everyday Limit all fluids for the day to less than 2 liters   At the [facility], you and your health needs are our priority. As part of our continuing mission to provide you with exceptional heart care, we have created designated Provider Care Teams. These Care Teams include your primary Cardiologist (physician) and Advanced Practice Providers (APPs- Physician Assistants and Nurse Practitioners) who all work together to provide you with the care you need, when you need it.   You may see any of the following providers on your designated Care Team at your next follow up: Dr Arvilla Meres Dr Marca Ancona Dr. Marcos Eke, NP Robbie Lis, [place] [facility] [place], [place] Brynda Peon, NP Karle Plumber, PharmD   Please be sure to bring in all your medications bottles to every appointment.    Thank  you for choosing [facility]  If you have any questions or concerns before your next appointment please send [place] a message through [place] or call our office at [phone].    TO LEAVE A MESSAGE FOR THE NURSE SELECT OPTION 2, PLEASE LEAVE A MESSAGE INCLUDING: YOUR NAME DATE OF BIRTH CALL BACK NUMBER REASON FOR CALL**this is important as we prioritize the call backs  YOU WILL RECEIVE A CALL BACK THE SAME DAY AS LONG AS YOU CALL BEFORE 4:00 PM

## 2023-03-18 ENCOUNTER — Telehealth (HOSPITAL_COMMUNITY): Payer: Self-pay | Admitting: Cardiology

## 2023-03-18 NOTE — Telephone Encounter (Signed)
Pts wife called to cancel upcoming procedure Reports pt does not wish to proceed at this time-no additional details/explanation given   Cancelled with Clydie Braun in cath lab  Message to provider as Pauline Aus

## 2023-03-20 ENCOUNTER — Other Ambulatory Visit (HOSPITAL_COMMUNITY): Payer: Self-pay

## 2023-03-25 ENCOUNTER — Encounter (HOSPITAL_COMMUNITY): Payer: Self-pay

## 2023-03-25 ENCOUNTER — Other Ambulatory Visit: Payer: Self-pay | Admitting: Nurse Practitioner

## 2023-03-25 ENCOUNTER — Ambulatory Visit (HOSPITAL_COMMUNITY): Admit: 2023-03-25 | Payer: Medicare HMO | Admitting: Cardiology

## 2023-03-25 ENCOUNTER — Other Ambulatory Visit (HOSPITAL_COMMUNITY): Payer: Self-pay

## 2023-03-25 SURGERY — RIGHT HEART CATH
Anesthesia: LOCAL

## 2023-03-25 MED ORDER — GABAPENTIN 100 MG PO CAPS
100.0000 mg | ORAL_CAPSULE | Freq: Two times a day (BID) | ORAL | 1 refills | Status: DC
  Filled 2023-03-25: qty 180, 90d supply, fill #0
  Filled 2023-06-22 – 2023-06-24 (×2): qty 180, 90d supply, fill #1

## 2023-03-29 ENCOUNTER — Other Ambulatory Visit (HOSPITAL_COMMUNITY): Payer: Self-pay

## 2023-04-03 ENCOUNTER — Inpatient Hospital Stay: Payer: Medicare HMO | Attending: Hematology

## 2023-04-03 ENCOUNTER — Ambulatory Visit: Payer: Medicare HMO | Admitting: Hematology

## 2023-04-03 ENCOUNTER — Other Ambulatory Visit: Payer: Self-pay | Admitting: *Deleted

## 2023-04-03 ENCOUNTER — Other Ambulatory Visit (HOSPITAL_COMMUNITY): Payer: Self-pay

## 2023-04-03 DIAGNOSIS — C61 Malignant neoplasm of prostate: Secondary | ICD-10-CM | POA: Diagnosis not present

## 2023-04-03 DIAGNOSIS — C169 Malignant neoplasm of stomach, unspecified: Secondary | ICD-10-CM

## 2023-04-03 DIAGNOSIS — D500 Iron deficiency anemia secondary to blood loss (chronic): Secondary | ICD-10-CM

## 2023-04-03 DIAGNOSIS — Z95828 Presence of other vascular implants and grafts: Secondary | ICD-10-CM

## 2023-04-03 LAB — CBC WITH DIFFERENTIAL (CANCER CENTER ONLY)
Abs Immature Granulocytes: 0.01 10*3/uL (ref 0.00–0.07)
Basophils Absolute: 0 10*3/uL (ref 0.0–0.1)
Basophils Relative: 1 %
Eosinophils Absolute: 0.2 10*3/uL (ref 0.0–0.5)
Eosinophils Relative: 4 %
HCT: 35.3 % — ABNORMAL LOW (ref 39.0–52.0)
Hemoglobin: 11.2 g/dL — ABNORMAL LOW (ref 13.0–17.0)
Immature Granulocytes: 0 %
Lymphocytes Relative: 16 %
Lymphs Abs: 1 10*3/uL (ref 0.7–4.0)
MCH: 29.3 pg (ref 26.0–34.0)
MCHC: 31.7 g/dL (ref 30.0–36.0)
MCV: 92.4 fL (ref 80.0–100.0)
Monocytes Absolute: 0.6 10*3/uL (ref 0.1–1.0)
Monocytes Relative: 9 %
Neutro Abs: 4.6 10*3/uL (ref 1.7–7.7)
Neutrophils Relative %: 70 %
Platelet Count: 241 10*3/uL (ref 150–400)
RBC: 3.82 MIL/uL — ABNORMAL LOW (ref 4.22–5.81)
RDW: 19.6 % — ABNORMAL HIGH (ref 11.5–15.5)
WBC Count: 6.5 10*3/uL (ref 4.0–10.5)
nRBC: 0 % (ref 0.0–0.2)

## 2023-04-03 LAB — CMP (CANCER CENTER ONLY)
ALT: 18 U/L (ref 0–44)
AST: 19 U/L (ref 15–41)
Albumin: 4.2 g/dL (ref 3.5–5.0)
Alkaline Phosphatase: 47 U/L (ref 38–126)
Anion gap: 7 (ref 5–15)
BUN: 23 mg/dL (ref 8–23)
CO2: 26 mmol/L (ref 22–32)
Calcium: 9.3 mg/dL (ref 8.9–10.3)
Chloride: 103 mmol/L (ref 98–111)
Creatinine: 1.21 mg/dL (ref 0.61–1.24)
GFR, Estimated: >60 mL/min (ref >60)
Glucose, Bld: 93 mg/dL (ref 70–99)
Potassium: 4 mmol/L (ref 3.5–5.1)
Sodium: 136 mmol/L (ref 135–145)
Total Bilirubin: 0.5 mg/dL (ref 0.0–1.2)
Total Protein: 6.8 g/dL (ref 6.5–8.1)

## 2023-04-03 LAB — FERRITIN: Ferritin: 22 ng/mL — ABNORMAL LOW (ref 24–336)

## 2023-04-03 MED ORDER — LIDOCAINE-PRILOCAINE 2.5-2.5 % EX CREA
1.0000 | TOPICAL_CREAM | CUTANEOUS | 0 refills | Status: AC | PRN
  Filled 2023-04-03: qty 30, 30d supply, fill #0

## 2023-04-03 MED ORDER — SODIUM CHLORIDE 0.9% FLUSH
10.0000 mL | Freq: Once | INTRAVENOUS | Status: AC
  Administered 2023-04-03: 10 mL

## 2023-04-03 MED ORDER — HEPARIN SOD (PORK) LOCK FLUSH 100 UNIT/ML IV SOLN
500.0000 [IU] | Freq: Once | INTRAVENOUS | Status: AC
Start: 2023-04-03 — End: 2023-04-03
  Administered 2023-04-03: 500 [IU]

## 2023-04-03 NOTE — Progress Notes (Signed)
Pt. Here for port flush/labs.  States he requested Lidocaine the last time he was here, but didn't get a call that the RX was called in.  Secure Chatted Sara/RN and called her about the above.  States she will call RX to [facility] as pt. Requested.  Pt. Made aware.

## 2023-04-04 ENCOUNTER — Telehealth: Payer: Self-pay

## 2023-04-04 NOTE — Telephone Encounter (Addendum)
Called patient to relay message below as per Dr. Mosetta Putt, patient voiced full understanding and had no further question.   ----- Message from Malachy Mood sent at 04/03/2023  9:16 PM EST ----- Please let pt know his anemia and iron level have improved, continue oral iron, thanks   Malachy Mood

## 2023-04-08 ENCOUNTER — Other Ambulatory Visit (HOSPITAL_COMMUNITY): Payer: Self-pay

## 2023-04-09 DIAGNOSIS — I739 Peripheral vascular disease, unspecified: Secondary | ICD-10-CM | POA: Diagnosis not present

## 2023-04-09 DIAGNOSIS — E1151 Type 2 diabetes mellitus with diabetic peripheral angiopathy without gangrene: Secondary | ICD-10-CM | POA: Diagnosis not present

## 2023-04-09 DIAGNOSIS — E1142 Type 2 diabetes mellitus with diabetic polyneuropathy: Secondary | ICD-10-CM | POA: Diagnosis not present

## 2023-04-09 DIAGNOSIS — L603 Nail dystrophy: Secondary | ICD-10-CM | POA: Diagnosis not present

## 2023-04-09 DIAGNOSIS — L84 Corns and callosities: Secondary | ICD-10-CM | POA: Diagnosis not present

## 2023-04-10 ENCOUNTER — Ambulatory Visit (INDEPENDENT_AMBULATORY_CARE_PROVIDER_SITE_OTHER): Payer: Medicare HMO

## 2023-04-10 DIAGNOSIS — I428 Other cardiomyopathies: Secondary | ICD-10-CM

## 2023-04-11 LAB — CUP PACEART REMOTE DEVICE CHECK
Battery Remaining Longevity: 25 mo
Battery Remaining Percentage: 27 %
Battery Voltage: 2.83 V
Brady Statistic AP VP Percent: 0 %
Brady Statistic AP VS Percent: 0 %
Brady Statistic AS VP Percent: 100 %
Brady Statistic AS VS Percent: 0 %
Brady Statistic RA Percent Paced: 1 %
Brady Statistic RV Percent Paced: 85 %
Date Time Interrogation Session: 20250226020015
HighPow Impedance: 60 Ohm
HighPow Impedance: 60 Ohm
Implantable Lead Connection Status: 753985
Implantable Lead Connection Status: 753985
Implantable Lead Implant Date: 20180413
Implantable Lead Implant Date: 20200828
Implantable Lead Location: 753859
Implantable Lead Location: 753860
Implantable Lead Model: 7122
Implantable Pulse Generator Implant Date: 20180413
Lead Channel Impedance Value: 380 Ohm
Lead Channel Impedance Value: 730 Ohm
Lead Channel Pacing Threshold Amplitude: 0.75 V
Lead Channel Pacing Threshold Amplitude: 1 V
Lead Channel Pacing Threshold Pulse Width: 0.5 ms
Lead Channel Pacing Threshold Pulse Width: 0.5 ms
Lead Channel Sensing Intrinsic Amplitude: 2.3 mV
Lead Channel Sensing Intrinsic Amplitude: 8.3 mV
Lead Channel Setting Pacing Amplitude: 2 V
Lead Channel Setting Pacing Amplitude: 2.5 V
Lead Channel Setting Pacing Pulse Width: 0.5 ms
Lead Channel Setting Sensing Sensitivity: 0.5 mV
Pulse Gen Serial Number: 7398151
Zone Setting Status: 755011

## 2023-04-14 ENCOUNTER — Encounter: Payer: Self-pay | Admitting: Internal Medicine

## 2023-04-23 ENCOUNTER — Other Ambulatory Visit (HOSPITAL_COMMUNITY): Payer: Self-pay

## 2023-04-30 ENCOUNTER — Ambulatory Visit (HOSPITAL_COMMUNITY)
Admission: RE | Admit: 2023-04-30 | Discharge: 2023-04-30 | Disposition: A | Discharge status: Home / Self Care | Source: Ambulatory Visit | Attending: Hematology | Admitting: Hematology

## 2023-04-30 DIAGNOSIS — C163 Malignant neoplasm of pyloric antrum: Secondary | ICD-10-CM | POA: Insufficient documentation

## 2023-04-30 DIAGNOSIS — D3501 Benign neoplasm of right adrenal gland: Secondary | ICD-10-CM | POA: Diagnosis not present

## 2023-04-30 DIAGNOSIS — D3502 Benign neoplasm of left adrenal gland: Secondary | ICD-10-CM | POA: Diagnosis not present

## 2023-04-30 DIAGNOSIS — K769 Liver disease, unspecified: Secondary | ICD-10-CM | POA: Diagnosis not present

## 2023-04-30 DIAGNOSIS — K4090 Unilateral inguinal hernia, without obstruction or gangrene, not specified as recurrent: Secondary | ICD-10-CM | POA: Diagnosis not present

## 2023-04-30 MED ORDER — IOHEXOL 300 MG/ML  SOLN
100.0000 mL | Freq: Once | INTRAMUSCULAR | Status: AC | PRN
  Administered 2023-04-30: 100 mL via INTRAVENOUS

## 2023-04-30 MED ORDER — SODIUM CHLORIDE (PF) 0.9 % IJ SOLN
INTRAMUSCULAR | Status: AC
  Filled 2023-04-30: qty 50

## 2023-05-06 ENCOUNTER — Telehealth (HOSPITAL_COMMUNITY): Payer: Self-pay | Admitting: Cardiology

## 2023-05-06 NOTE — Telephone Encounter (Signed)
 Called to confirm/remind patient of their appointment at the [facility] on 05/06/2023.   Appointment:   [x] Confirmed  [] Left mess   [] No answer/No voice mail  [] Phone not in service  Patient reminded to bring all medications and/or complete list.  Confirmed patient has transportation. Gave directions, instructed to utilize valet parking.

## 2023-05-07 ENCOUNTER — Ambulatory Visit (HOSPITAL_COMMUNITY)
Admission: RE | Admit: 2023-05-07 | Discharge: 2023-05-07 | Disposition: A | Discharge status: Home / Self Care | Payer: Medicare HMO | Source: Ambulatory Visit | Attending: Cardiology | Admitting: Cardiology

## 2023-05-07 ENCOUNTER — Encounter (HOSPITAL_COMMUNITY): Payer: Self-pay | Admitting: Cardiology

## 2023-05-07 VITALS — BP 100/60 | Wt 172.0 lb

## 2023-05-07 DIAGNOSIS — Z8546 Personal history of malignant neoplasm of prostate: Secondary | ICD-10-CM | POA: Diagnosis not present

## 2023-05-07 DIAGNOSIS — I4821 Permanent atrial fibrillation: Secondary | ICD-10-CM | POA: Diagnosis not present

## 2023-05-07 DIAGNOSIS — I110 Hypertensive heart disease with heart failure: Secondary | ICD-10-CM | POA: Diagnosis not present

## 2023-05-07 DIAGNOSIS — Z7984 Long term (current) use of oral hypoglycemic drugs: Secondary | ICD-10-CM | POA: Diagnosis not present

## 2023-05-07 DIAGNOSIS — I5022 Chronic systolic (congestive) heart failure: Secondary | ICD-10-CM | POA: Insufficient documentation

## 2023-05-07 DIAGNOSIS — I428 Other cardiomyopathies: Secondary | ICD-10-CM | POA: Insufficient documentation

## 2023-05-07 DIAGNOSIS — Z79899 Other long term (current) drug therapy: Secondary | ICD-10-CM | POA: Diagnosis not present

## 2023-05-07 DIAGNOSIS — E119 Type 2 diabetes mellitus without complications: Secondary | ICD-10-CM | POA: Diagnosis not present

## 2023-05-07 DIAGNOSIS — I2510 Atherosclerotic heart disease of native coronary artery without angina pectoris: Secondary | ICD-10-CM | POA: Diagnosis not present

## 2023-05-07 DIAGNOSIS — Z7901 Long term (current) use of anticoagulants: Secondary | ICD-10-CM | POA: Diagnosis not present

## 2023-05-07 DIAGNOSIS — Z85028 Personal history of other malignant neoplasm of stomach: Secondary | ICD-10-CM | POA: Diagnosis not present

## 2023-05-07 LAB — BASIC METABOLIC PANEL
Anion gap: 8 (ref 5–15)
BUN: 25 mg/dL — ABNORMAL HIGH (ref 8–23)
CO2: 25 mmol/L (ref 22–32)
Calcium: 9.3 mg/dL (ref 8.9–10.3)
Chloride: 104 mmol/L (ref 98–111)
Creatinine, Ser: 1.26 mg/dL — ABNORMAL HIGH (ref 0.61–1.24)
GFR, Estimated: 60 mL/min — ABNORMAL LOW (ref >60)
Glucose, Bld: 96 mg/dL (ref 70–99)
Potassium: 3.9 mmol/L (ref 3.5–5.1)
Sodium: 137 mmol/L (ref 135–145)

## 2023-05-07 LAB — BRAIN NATRIURETIC PEPTIDE: B Natriuretic Peptide: 537.6 pg/mL — ABNORMAL HIGH (ref 0.0–100.0)

## 2023-05-07 LAB — DIGOXIN LEVEL: Digoxin Level: 0.2 ng/mL — ABNORMAL LOW (ref 0.8–2.0)

## 2023-05-07 NOTE — Progress Notes (Signed)
 ADVANCED HF CLINIC NOTE   Primary Care: Renford Dills, MD Primary Cardiologist: Kristeen Miss, MD HF Cardiologist: Dr. Shirlee Latch  Chief complaint: CHF  HPI: [name] is a 73 y.o. with a history of chronic systolic heart failure, St Jude ICD, permanent atrial fibrillation, CAD, DMII, LBBB, HTN, gastric cancer 2022, and prostate cancer 2019.    Cath 2018 showed mild to moderate non-obstructive CAD. Fick CI 1.9.    Echo 10/22 showed EF 20-25%, mod-severe MR, mod TR, RV nl   Echo 2/23 showed EF 20-25%, mild MR, RV not well visualized   Admitted 8/23 with HF. Diuresed with IV lasix and GDMT adjusted. Echo showed EF 20-25%, RV mildly reduced, LA severely dilated, moderate-severe MR.    Had follow up with Dr Mosetta Putt 09/2022. CT scan no evidence of recurrent cancer. Plan for repeat CT in 8 months.    Admitted 9/24 with CHF. Echo showed EF 10-15%, G3DD, RV mildly reduced. R/LHC showed nonobstructive CAD, normal filling pressures, and low cardiac index. Unable to get cMRI as device not compatible. GDMT titrated and he was discharged home, weight 183 lbs.  Echo 12/24 EF 20% with severe LV dilation, normal RV, severe LAE, moderate MR, IVC normal.   He was scheduled for RHC but decided to cancel.   Today he returns for HF follow up with wife. He is doing well symptomatically.  He has finished cardiac rehab and is walking daily.  No dyspnea except with a long walk.  No orthopnea/PND.  No lightheadedness.  No chest pain.  He is not interested in having a sleep study done.   ECG (personally reviewed): Atrial fibrillation, RV pacing, QRS 190 msec  St Jude device interrogation (personally reviewed): 84% RV pacing, stable thoracic impedance.   Labs (9/24): K 3.7, creatinine 1.13, Lp(a) 184 Labs (10/24): K 4.3, creatinine 1.27, hgb 13.6 Labs (1/25): 4.1, creatinine 1.26 Labs (2/25): K 4, creatinine 1.21, hgb 11.2, LFTs normal  PMH: 1. H/o gastric cancer.  2. H/o prostate cancer.  3. Type 2  DM 4. Atrial fibrillation: Permanent 5. Chronic systolic CHF: Nonischemic cardiomyopathy. St Jude CRT-D.  - R/LHC (2018): Mild to moderate non obstructive CAD; RA 8, PA 42/20 (29), PCWP 17, CO/CI (Fick) 3.76/ 1.9  - Echo (10/22): EF 20-25%, mod-severe MR, mod TR, RV normal - Echo (2/23): EF 20-25%, mild MR, RV not well visualized - Echo (8/24): EF 20-25%, RV mildly reduced, LA severely dilated, moderate to severe MR - R/LHC (9/24): non obstructive CAD; RA 1, PA 28/10 (17), PCWP 8, Fick CI 2.16, thermo CI 1.84.  - Echo (9/24): EF 10-15%, severe LV dilation, mild RV dysfunction with moderate RV enlargement, moderate-severe MR.  - Echo (12/24): EF 20% with severe LV dilation, normal RV, severe LAE, moderate MR, IVC normal.  6. Fe deficiency anemia  FH: mother CVA, MI  Review of Systems: All systems reviewed and negative except as per HPI.   Current Outpatient Medications  Medication Sig Dispense Refill   atorvastatin (LIPITOR) 40 MG tablet Take 1 tablet (40 mg total) by mouth daily. 90 tablet 1   Biotin 1000 MCG tablet Take 1,000 mcg by mouth daily with breakfast.     cholecalciferol (VITAMIN D3) 25 MCG (1000 UT) tablet Take 1,000 Units by mouth daily with breakfast.     digoxin (LANOXIN) 0.125 MG tablet Take 1/2 tablet (0.0625mg  total) by mouth daily. 15 tablet 6   empagliflozin (JARDIANCE) 10 MG TABS tablet Take 1 tablet (10 mg total) by mouth daily.  90 tablet 3   furosemide (LASIX) 40 MG tablet Take 1 tablet (40 mg total) by mouth daily. 30 tablet 6   gabapentin (NEURONTIN) 100 MG capsule Take 1 capsule (100 mg total) by mouth 2 (two) times daily. 180 capsule 1   ketoconazole (NIZORAL) 2 % cream Apply 1 Application topically 2 (two) times daily. (Patient taking differently: Apply 1 Application topically 2 (two) times daily. As needed) 30 g 3   lidocaine-prilocaine (EMLA) cream Apply 1 Application topically as needed. 30 g 0   loratadine (CLARITIN) 10 MG tablet Take 10 mg by mouth daily.      metFORMIN (GLUCOPHAGE-XR) 500 MG 24 hr tablet Take 1 tablet (500 mg total) by mouth with evening meal 90 tablet 3   metoprolol succinate (TOPROL-XL) 25 MG 24 hr tablet Take 1 tablet (25 mg total) by mouth daily. 90 tablet 3   multivitamin (ONE-A-DAY MEN'S) TABS tablet Take 1 tablet by mouth daily with breakfast.     Naftifine HCl (NAFTIN) 2 % GEL Apply 1 application  topically every Monday, Wednesday, and Friday.     pantoprazole (PROTONIX) 40 MG tablet Take 1 tablet (40 mg total) by mouth 2 (two) times daily. 180 tablet 3   potassium chloride (KLOR-CON) 10 MEQ tablet Take 2 tablets (20 mEq total) by mouth daily. 60 tablet 3   rivaroxaban (XARELTO) 20 MG TABS tablet Take 1 tablet (20 mg total) by mouth daily with supper. 90 tablet 3   sacubitril-valsartan (ENTRESTO) 24-26 MG Take 1 tablet by mouth 2 (two) times daily. 180 tablet 1   spironolactone (ALDACTONE) 25 MG tablet Take 1 tablet (25 mg total) by mouth daily. 90 tablet 3   Tavaborole 5 % SOLN Apply 1 application  topically daily. Nail on left hand     No current facility-administered medications for this encounter.   Allergies  Allergen Reactions   Aspirin Anaphylaxis and Hives   Sulfa Antibiotics Anaphylaxis, Hives, Swelling and Other (See Comments)    Swollen lips   Social History   Socioeconomic History   Marital status: Married    Spouse name: Not on file   Number of children: 2   Years of education: Not on file   Highest education level: Not on file  Occupational History   Occupation: retired  Tobacco Use   Smoking status: Never   Smokeless tobacco: Never  Vaping Use   Vaping status: Never Used  Substance and Sexual Activity   Alcohol use: No   Drug use: No   Sexual activity: Not Currently  Other Topics Concern   Not on file  Social History Narrative   Retired Location manager. Married to [name]. Daughter, [name], lives in [place]. Son, Timken, lives in [place].   Social Drivers of Manufacturing engineer Strain: Low Risk  (12/26/2021)   Overall Financial Resource Strain (CARDIA)    Difficulty of Paying Living Expenses: Not very hard  Food Insecurity: No Food Insecurity (10/29/2022)   Hunger Vital Sign    Worried About Running Out of Food in the Last Year: Never true    Ran Out of Food in the Last Year: Never true  Transportation Needs: No Transportation Needs (10/29/2022)   PRAPARE - Administrator, Civil Service (Medical): No    Lack of Transportation (Non-Medical): No  Physical Activity: Insufficiently Active (09/26/2021)   Exercise Vital Sign    Days of Exercise per Week: 7 days    Minutes of Exercise per  Session: 10 min  Stress: No Stress Concern Present (09/26/2021)   [employer] of Occupational Health - Occupational Stress Questionnaire    Feeling of Stress : Not at all  Social Connections: Not on file  Intimate Partner Violence: Not At Risk (10/29/2022)   Humiliation, Afraid, Rape, and Kick questionnaire    Fear of Current or Ex-Partner: No    Emotionally Abused: No    Physically Abused: No    Sexually Abused: No   BP 100/60   Wt 78 kg (172 lb)   BMI 27.76 kg/m   Wt Readings from Last 3 Encounters:  05/07/23 78 kg (172 lb)  03/15/23 80.8 kg (178 lb 3.2 oz)  02/27/23 80.9 kg (178 lb 5.6 oz)   PHYSICAL EXAM: General: NAD Neck: No JVD, no thyromegaly or thyroid nodule.  Lungs: Clear to auscultation bilaterally with normal respiratory effort. CV: Nondisplaced PMI.  Heart regular S1/S2, no S3/S4, no murmur.  No peripheral edema.  No carotid bruit.  Normal pedal pulses.  Abdomen: Soft, nontender, no hepatosplenomegaly, no distention.  Skin: Intact without lesions or rashes.  Neurologic: Alert and oriented x 3.  Psych: Normal affect. Extremities: No clubbing or cyanosis.  HEENT: Normal.   ASSESSMENT & PLAN: 1. Chronic systolic CHF: Long-standing cardiomyopathy, nonischemic.  St Jude CRT-D device. Device is not MRI compatible. Cath in  2018 with nonobstructive CAD but CI at that time was low at 1.9.  Echo in 8/23 showed EF 20-25%, mild RV dysfunction, moderate-severe MR.  Admitted 9/24 with CHF. Echo showed EF 10-15%, G3DD, RV mildly reduced. R/LHC showed non-obstructive CAD, normal filling pressures and low CI (2.16 Fick/1.84 thermo). Echo 12/24 showed EF 20% with severe LV dilation, normal RV, severe LAE, moderate MR, IVC normal. Despite history of low output HF, he actually reports minimal symptoms (NYHA class II) and is not volume overloaded by exam or [place].  - Based on low output on prior RHC and markedly low EF, would consider him for LVAD.  However, per his report he is minimally symptomatic. I have recommended RHC to see if hemodynamics are improved. We set this up but he decided he did not want to have it done.  I think we can continue to watch him for now given minimal symptoms.  I would also like to get a CPX for objective measurement of his functional capacity but unable to get this currently as we do not have an exercise tech.  - Patient appears to be RV pacing 84% of the time with wide QRS.  Device interrogation does not show BiV pacing though per report he has a BiV device.  He needs followup with Dr. Ladona Ridgel to straighten this out.  I will contact Dr. Ladona Ridgel and try to get him in for reprogramming.  - Continue digoxin. Check level.  - Continue Lasix 40 mg daily. BMET/BNP today.  - Continue Entresto 24/26 mg bid, no BP room to increase.  - Continue spironolactone 25 mg daily. - Continue Jardiance 10 mg daily - Continue Toprol XL 25 mg daily. 2. Atrial fibrillation: This is permanent.   - Continue Xarelto.   3. Gastric cancer: Treated, now being monitored.  4. Type 2 diabetes: Per PCP.  - Continue Jardiance. 5. OSA: Suspected. He declines sleep study.  Followup 3 months with APP.  Needs appt with EP for reprogramming of device.   I spent 31 minutes reviewing records, interviewing/examining patient, and managing  orders.     Marca Ancona  05/07/23

## 2023-05-07 NOTE — Patient Instructions (Addendum)
 There has been no changes to your medications.  Labs done today, your results will be available in MyChart, we will contact you for abnormal readings.  Your physician recommends that you schedule a follow-up appointment as scheduled.  If you have any questions or concerns before your next appointment please send [place] a message through [place] or call our office at [phone].    TO LEAVE A MESSAGE FOR THE NURSE SELECT OPTION 2, PLEASE LEAVE A MESSAGE INCLUDING: YOUR NAME DATE OF BIRTH CALL BACK NUMBER REASON FOR CALL**this is important as we prioritize the call backs  YOU WILL RECEIVE A CALL BACK THE SAME DAY AS LONG AS YOU CALL BEFORE 4:00 PM  At the [facility], you and your health needs are our priority. As part of our continuing mission to provide you with exceptional heart care, we have created designated Provider Care Teams. These Care Teams include your primary Cardiologist (physician) and Advanced Practice Providers (APPs- Physician Assistants and Nurse Practitioners) who all work together to provide you with the care you need, when you need it.   You may see any of the following providers on your designated Care Team at your next follow up: Dr Arvilla Meres Dr Marca Ancona Dr. Dorthula Nettles Dr. Clearnce Hasten Amy Filbert Schilder, NP Robbie Lis, [place] [facility] [place], [place] Brynda Peon, NP [place] Lee, NP Clarisa Kindred, NP Karle Plumber, PharmD Enos Fling, PharmD   Please be sure to bring in all your medications bottles to every appointment.    Thank you for choosing [facility]

## 2023-05-13 ENCOUNTER — Other Ambulatory Visit (HOSPITAL_COMMUNITY)

## 2023-05-14 ENCOUNTER — Emergency Department (HOSPITAL_COMMUNITY)
Admission: EM | Admit: 2023-05-14 | Discharge: 2023-05-14 | Disposition: A | Discharge status: Home / Self Care | Attending: Emergency Medicine | Admitting: Emergency Medicine

## 2023-05-14 ENCOUNTER — Other Ambulatory Visit (HOSPITAL_COMMUNITY): Payer: Self-pay

## 2023-05-14 ENCOUNTER — Other Ambulatory Visit: Payer: Self-pay

## 2023-05-14 ENCOUNTER — Encounter (HOSPITAL_COMMUNITY): Payer: Self-pay

## 2023-05-14 DIAGNOSIS — I509 Heart failure, unspecified: Secondary | ICD-10-CM | POA: Diagnosis not present

## 2023-05-14 DIAGNOSIS — Z79899 Other long term (current) drug therapy: Secondary | ICD-10-CM | POA: Insufficient documentation

## 2023-05-14 DIAGNOSIS — K2970 Gastritis, unspecified, without bleeding: Secondary | ICD-10-CM | POA: Diagnosis not present

## 2023-05-14 DIAGNOSIS — Z85028 Personal history of other malignant neoplasm of stomach: Secondary | ICD-10-CM | POA: Diagnosis not present

## 2023-05-14 DIAGNOSIS — I2510 Atherosclerotic heart disease of native coronary artery without angina pectoris: Secondary | ICD-10-CM | POA: Diagnosis not present

## 2023-05-14 DIAGNOSIS — I110 Hypertensive heart disease with heart failure: Secondary | ICD-10-CM | POA: Insufficient documentation

## 2023-05-14 DIAGNOSIS — R109 Unspecified abdominal pain: Secondary | ICD-10-CM | POA: Diagnosis present

## 2023-05-14 LAB — CBC WITH DIFFERENTIAL/PLATELET
Abs Immature Granulocytes: 0.02 10*3/uL (ref 0.00–0.07)
Basophils Absolute: 0 10*3/uL (ref 0.0–0.1)
Basophils Relative: 0 %
Eosinophils Absolute: 0.2 10*3/uL (ref 0.0–0.5)
Eosinophils Relative: 3 %
HCT: 34.3 % — ABNORMAL LOW (ref 39.0–52.0)
Hemoglobin: 10.6 g/dL — ABNORMAL LOW (ref 13.0–17.0)
Immature Granulocytes: 0 %
Lymphocytes Relative: 8 %
Lymphs Abs: 0.7 10*3/uL (ref 0.7–4.0)
MCH: 29.9 pg (ref 26.0–34.0)
MCHC: 30.9 g/dL (ref 30.0–36.0)
MCV: 96.9 fL (ref 80.0–100.0)
Monocytes Absolute: 0.7 10*3/uL (ref 0.1–1.0)
Monocytes Relative: 8 %
Neutro Abs: 6.5 10*3/uL (ref 1.7–7.7)
Neutrophils Relative %: 81 %
Platelets: 245 10*3/uL (ref 150–400)
RBC: 3.54 MIL/uL — ABNORMAL LOW (ref 4.22–5.81)
RDW: 18.6 % — ABNORMAL HIGH (ref 11.5–15.5)
WBC: 8.1 10*3/uL (ref 4.0–10.5)
nRBC: 0 % (ref 0.0–0.2)

## 2023-05-14 LAB — COMPREHENSIVE METABOLIC PANEL WITH GFR
ALT: 17 U/L (ref 0–44)
AST: 28 U/L (ref 15–41)
Albumin: 3.4 g/dL — ABNORMAL LOW (ref 3.5–5.0)
Alkaline Phosphatase: 42 U/L (ref 38–126)
Anion gap: 13 (ref 5–15)
BUN: 16 mg/dL (ref 8–23)
CO2: 20 mmol/L — ABNORMAL LOW (ref 22–32)
Calcium: 9 mg/dL (ref 8.9–10.3)
Chloride: 103 mmol/L (ref 98–111)
Creatinine, Ser: 1.16 mg/dL (ref 0.61–1.24)
GFR, Estimated: >60 mL/min (ref >60)
Glucose, Bld: 96 mg/dL (ref 70–99)
Potassium: 4.4 mmol/L (ref 3.5–5.1)
Sodium: 136 mmol/L (ref 135–145)
Total Bilirubin: 0.7 mg/dL (ref 0.0–1.2)
Total Protein: 6.2 g/dL — ABNORMAL LOW (ref 6.5–8.1)

## 2023-05-14 LAB — LIPASE, BLOOD: Lipase: 39 U/L (ref 11–51)

## 2023-05-14 MED ORDER — ONDANSETRON 4 MG PO TBDP
4.0000 mg | ORAL_TABLET | Freq: Once | ORAL | Status: AC
  Administered 2023-05-14: 4 mg via ORAL
  Filled 2023-05-14: qty 1

## 2023-05-14 MED ORDER — ALUM & MAG HYDROXIDE-SIMETH 200-200-20 MG/5ML PO SUSP
30.0000 mL | Freq: Once | ORAL | Status: AC
  Administered 2023-05-14: 30 mL via ORAL
  Filled 2023-05-14: qty 30

## 2023-05-14 MED ORDER — SUCRALFATE 1 G PO TABS
1.0000 g | ORAL_TABLET | Freq: Three times a day (TID) | ORAL | 0 refills | Status: DC
  Filled 2023-05-14: qty 30, 8d supply, fill #0

## 2023-05-14 MED ORDER — FAMOTIDINE 40 MG PO TABS
40.0000 mg | ORAL_TABLET | Freq: Every day | ORAL | 0 refills | Status: DC
  Filled 2023-05-14: qty 14, 14d supply, fill #0

## 2023-05-14 NOTE — ED Notes (Signed)
 Attempted peripheral IV x2 by two RN's

## 2023-05-14 NOTE — Discharge Instructions (Addendum)
 Start taking the prevacid for the next 2 weeks.  You can use the carafate as needed before eating and before you go to bed.  If you start having vomiting, blood in your stool, fever or other concerns return to the ER.  Blood work and CAT scan from the other day looked good.

## 2023-05-14 NOTE — Addendum Note (Signed)
 Addended by: Elease Etienne A on: 05/14/2023 04:05 PM   Modules accepted: Orders

## 2023-05-14 NOTE — ED Triage Notes (Signed)
 Pt c/o abdominal pain and nausea for past few days which has worsened today. Pt denies vomiting, diarrhea or constipation.

## 2023-05-14 NOTE — ED Provider Notes (Signed)
 [place] EMERGENCY DEPARTMENT AT [facility] Provider Note   CSN: [id number] Arrival date & time: 05/14/23  4098     History  Chief Complaint  Patient presents with   Abdominal Pain    Jon Williams is a 73 y.o. male.  Patient is a 73 year old male with a history of CAD, pulmonary hypertension, CHF with an EF of 20%, gastric cancer status post complete response to chemotherapy 3 years ago who is currently under surveillance, diabetes and hypertension who is presenting today with complaint of abdominal pain.  Patient has a hard time reporting exactly how long he has been having abdominal pain because it seems to wax and wane.  Maybe 1 to 2 months but it comes and goes in waves.  He reports for the last 2-3 days the pain has been more unbearable.  He reports he wakes up in the morning with a terrible taste in his mouth and he will brush his teeth but sometimes has to suck on candy to get the bad taste out of his mouth.  Sometimes with eating it will improve the pain but his wife has noted he is eating smaller quantities than he used to.  He does not feel like he is having heartburn and denies any chest pain or shortness of breath.  He has been good about keeping his salt down and limiting his water and has not noticed any distention of his abdomen or swelling in his legs.  He does not drink alcohol or use NSAIDs or aspirin.  He denies any prior abdominal surgeries and has had no recent medication changes.  He did follow-up with his oncologist for his normal scheduled visit and had a CT scan done for surveillance on 04/30/2023 but has not received the results yet.  He denies any vomiting, change in stool or urinary difficulties at this time.  No fevers.  The history is provided by the patient, the spouse and medical records.  Abdominal Pain      Home Medications Prior to Admission medications   Medication Sig Start Date End Date Taking? Authorizing Provider  famotidine (PEPCID)  40 MG tablet Take 1 tablet (40 mg total) by mouth daily for 14 days. 05/14/23 05/28/23 Yes Marvion Bastidas, Alphonzo Lemmings, MD  sucralfate (CARAFATE) 1 g tablet Take 1 tablet (1 g total) by mouth 4 (four) times daily -  with meals and at bedtime. Take as needed 05/14/23  Yes Kailynne Ferrington, Alphonzo Lemmings, MD  atorvastatin (LIPITOR) 40 MG tablet Take 1 tablet (40 mg total) by mouth daily. 11/12/22   Jacklynn Ganong, FNP  Biotin 1000 MCG tablet Take 1,000 mcg by mouth daily with breakfast.    [provider]  cholecalciferol (VITAMIN D3) 25 MCG (1000 UT) tablet Take 1,000 Units by mouth daily with breakfast.    [provider]  digoxin (LANOXIN) 0.125 MG tablet Take 1/2 tablet (0.0625mg  total) by mouth daily. 03/15/23   Milford, Anderson Malta, FNP  empagliflozin (JARDIANCE) 10 MG TABS tablet Take 1 tablet (10 mg total) by mouth daily. 11/16/22     furosemide (LASIX) 40 MG tablet Take 1 tablet (40 mg total) by mouth daily. 11/12/22   Milford, Anderson Malta, FNP  gabapentin (NEURONTIN) 100 MG capsule Take 1 capsule (100 mg total) by mouth 2 (two) times daily. 03/25/23   Pollyann Samples, NP  ketoconazole (NIZORAL) 2 % cream Apply 1 Application topically 2 (two) times daily. Patient taking differently: Apply 1 Application topically 2 (two) times daily. As needed  09/26/22     lidocaine-prilocaine (EMLA) cream Apply 1 Application topically as needed. 04/03/23   Malachy Mood, MD  loratadine (CLARITIN) 10 MG tablet Take 10 mg by mouth daily.    [provider]  metFORMIN (GLUCOPHAGE-XR) 500 MG 24 hr tablet Take 1 tablet (500 mg total) by mouth with evening meal 09/21/22     metoprolol succinate (TOPROL-XL) 25 MG 24 hr tablet Take 1 tablet (25 mg total) by mouth daily. 03/15/23   Jacklynn Ganong, FNP  multivitamin (ONE-A-DAY MEN'S) TABS tablet Take 1 tablet by mouth daily with breakfast.    [provider]  Naftifine HCl (NAFTIN) 2 % GEL Apply 1 application  topically every Monday, Wednesday, and Friday.    [provider]  pantoprazole (PROTONIX) 40 MG tablet Take 1 tablet (40 mg total) by mouth 2 (two) times daily. 01/04/23     potassium chloride (KLOR-CON) 10 MEQ tablet Take 2 tablets (20 mEq total) by mouth daily. 11/12/22   Milford, Anderson Malta, FNP  rivaroxaban (XARELTO) 20 MG TABS tablet Take 1 tablet (20 mg total) by mouth daily with supper. 03/15/23   Milford, Anderson Malta, FNP  sacubitril-valsartan (ENTRESTO) 24-26 MG Take 1 tablet by mouth 2 (two) times daily. 11/12/22   Jacklynn Ganong, FNP  spironolactone (ALDACTONE) 25 MG tablet Take 1 tablet (25 mg total) by mouth daily. 03/15/23   Milford, Anderson Malta, FNP  Tavaborole 5 % SOLN Apply 1 application  topically daily. Nail on left hand    [provider]  prochlorperazine (COMPAZINE) 10 MG tablet Take 1 tablet (10 mg total) by mouth every 6 (six) hours as needed (Nausea or vomiting). 02/15/21 05/12/21  Malachy Mood, MD      Allergies    Aspirin and Sulfa antibiotics    Review of Systems   Review of Systems  Gastrointestinal:  Positive for abdominal pain.    Physical Exam Updated Vital Signs BP 102/71 (BP Location: Left Arm)   Pulse 61   Temp 97.7 F (36.5 C)   Resp 17   Ht 5\' 6"  (1.676 m)   Wt 78.5 kg   SpO2 100%   BMI 27.92 kg/m  Physical Exam Vitals and nursing note reviewed.  Constitutional:      General: He is not in acute distress.    Appearance: He is well-developed.  HENT:     Head: Normocephalic and atraumatic.  Eyes:     Conjunctiva/sclera: Conjunctivae normal.     Pupils: Pupils are equal, round, and reactive to light.  Cardiovascular:     Rate and Rhythm: Normal rate and regular rhythm.     Heart sounds: No murmur heard. Pulmonary:     Effort: Pulmonary effort is normal. No respiratory distress.     Breath sounds: Normal breath sounds. No wheezing or rales.  Abdominal:     General: There is no distension.     Palpations: Abdomen is soft.     Tenderness: There is abdominal tenderness in the right upper  quadrant, epigastric area and left upper quadrant. There is no guarding or rebound.     Comments: Upper abdomen is tender most pronounced in the epigastric area  Musculoskeletal:        General: No tenderness. Normal range of motion.     Cervical back: Normal range of motion and neck supple.     Right lower leg: No edema.     Left lower leg: No edema.  Skin:    General: Skin is  warm and dry.     Findings: No erythema or rash.  Neurological:     Mental Status: He is alert and oriented to person, place, and time. Mental status is at baseline.  Psychiatric:        Mood and Affect: Mood normal.        Behavior: Behavior normal.     ED Results / Procedures / Treatments   Labs (all labs ordered are listed, but only abnormal results are displayed) Labs Reviewed  CBC WITH DIFFERENTIAL/PLATELET - Abnormal; Notable for the following components:      Result Value   RBC 3.54 (*)    Hemoglobin 10.6 (*)    HCT 34.3 (*)    RDW 18.6 (*)    All other components within normal limits  COMPREHENSIVE METABOLIC PANEL WITH GFR - Abnormal; Notable for the following components:   CO2 20 (*)    Total Protein 6.2 (*)    Albumin 3.4 (*)    All other components within normal limits  LIPASE, BLOOD    EKG None  Radiology No results found.  Procedures Procedures    Medications Ordered in ED Medications  alum & mag hydroxide-simeth (MAALOX/MYLANTA) 200-200-20 MG/5ML suspension 30 mL (30 mLs Oral Given 05/14/23 1005)  ondansetron (ZOFRAN-ODT) disintegrating tablet 4 mg (4 mg Oral Given 05/14/23 1007)    ED Course/ Medical Decision Making/ A&P                                 Medical Decision Making Amount and/or Complexity of Data Reviewed Independent Historian: spouse External Data Reviewed: notes. Labs: ordered. Decision-making details documented in ED Course.  Risk OTC drugs. Prescription drug management.   Pt with multiple medical problems and comorbidities and presenting today with  a complaint that caries a high risk for morbidity and mortality.  Here today with waxing waning abdominal pain for the last few months but worse today.  History of gastric cancer and concern for possible recurrence versus ulcer disease as patient does report symptoms are worse in the morning and improved after eating.  Low suspicion for mesenteric ischemia.  Also possible cholecystitis, pancreatitis.  Low suspicion for UTI, renal stone, diverticulitis.  Patient did have a CT of his chest abdomen pelvis done on 04/30/2023.  He reports at this time he was having intermittent pain like he is having now.  CT scan done showed no acute findings.  There is a small lesion in his liver which is unchanged but no acute processes are identified or signs of recurrent cancer.  Patient does report that symptoms are better after using Pepto-Bismol at home.  Will ensure labs are stable and electrolytes and renal function are normal.  Will also check LFTs and lipase.  Patient given pain control.  12:00 PM I independently interpreted patient's labs and CBC, CMP and lipase are all within normal limits.  No acute findings on CT.  Patient remains hemodynamically stable here with improved symptoms.  He is on a PPI twice daily but will add Pepcid for the next 2 weeks as well as Carafate as needed.  Also recommended he follow-up with Dr. Elnoria Howard who is his GI specialist.  In the past patient has not felt stable enough to undergo endoscopy due to the effects of the anesthetics on his heart but did recommend that he follow-up to ensure his symptoms are improving.  They are comfortable with this plan.  He  was given return precautions but at this time appears stable for discharge.         Final Clinical Impression(s) / ED Diagnoses Final diagnoses:  Gastritis without bleeding, unspecified chronicity, unspecified gastritis type    Rx / DC Orders ED Discharge Orders          Ordered    sucralfate (CARAFATE) 1 g tablet  3 times  daily with meals & bedtime        05/14/23 1159    famotidine (PEPCID) 40 MG tablet  Daily        05/14/23 1159              Gwyneth Sprout, MD 05/14/23 1201

## 2023-05-14 NOTE — Progress Notes (Signed)
 Remote ICD transmission.

## 2023-05-14 NOTE — ED Notes (Signed)
 Patient discharged by this RN. Patient verbalizes understanding of instructions with no additional questions for RN. Patient ambulatory at time of discharge.

## 2023-05-20 ENCOUNTER — Other Ambulatory Visit (HOSPITAL_COMMUNITY): Payer: Self-pay

## 2023-05-20 ENCOUNTER — Other Ambulatory Visit: Payer: Self-pay

## 2023-05-20 ENCOUNTER — Other Ambulatory Visit (HOSPITAL_COMMUNITY): Payer: Self-pay | Admitting: Family Medicine

## 2023-05-20 DIAGNOSIS — C169 Malignant neoplasm of stomach, unspecified: Secondary | ICD-10-CM | POA: Diagnosis not present

## 2023-05-20 DIAGNOSIS — K219 Gastro-esophageal reflux disease without esophagitis: Secondary | ICD-10-CM | POA: Diagnosis not present

## 2023-05-20 DIAGNOSIS — R61 Generalized hyperhidrosis: Secondary | ICD-10-CM | POA: Diagnosis not present

## 2023-05-20 DIAGNOSIS — I2510 Atherosclerotic heart disease of native coronary artery without angina pectoris: Secondary | ICD-10-CM

## 2023-05-20 MED ORDER — ATORVASTATIN CALCIUM 40 MG PO TABS
40.0000 mg | ORAL_TABLET | Freq: Every day | ORAL | 3 refills | Status: AC
  Filled 2023-05-20 (×2): qty 90, 90d supply, fill #0
  Filled 2023-09-02: qty 90, 90d supply, fill #2
  Filled 2023-09-02: qty 90, 90d supply, fill #1
  Filled 2023-12-06: qty 90, 90d supply, fill #2
  Filled 2024-03-04: qty 90, 90d supply, fill #3

## 2023-05-20 NOTE — Assessment & Plan Note (Addendum)
 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  S/p FLOT4 01/02/2021 - 03/22/2021 -Posttreatment EUS 05/2021 and 06/29/2022 by Dr. Elnoria Howard showed a scar and residual erythema in stomach, biopsy negative for residual malignancy.  -Jon Williams is clinically doing well on surveillance. Last CT 03/20/22 which shows no evidence of recurrent or metastatic disease, and stable bilateral adrenal adenomas -Surveillance CT scan from October 09, 2022 showed no evidence of recurrence -Surveillance CT scan from April 30, 2023 showed no evidence of residual or recurrent malignancy.  Small liver lesion is stable, favored to be benign. -He is clinically doing well overall, will continue monitoring.

## 2023-05-20 NOTE — Assessment & Plan Note (Signed)
-  diagnosed with Gleason 4+4 prostate cancer in early 2019. He was treated with long-term ADT in combination with 8 weeks of IMRT.

## 2023-05-21 ENCOUNTER — Inpatient Hospital Stay: Payer: Medicare HMO | Admitting: Hematology

## 2023-05-21 ENCOUNTER — Encounter: Payer: Self-pay | Admitting: Hematology

## 2023-05-21 ENCOUNTER — Ambulatory Visit: Admitting: Internal Medicine

## 2023-05-21 ENCOUNTER — Other Ambulatory Visit (HOSPITAL_COMMUNITY): Payer: Self-pay

## 2023-05-21 ENCOUNTER — Other Ambulatory Visit: Payer: Self-pay

## 2023-05-21 ENCOUNTER — Inpatient Hospital Stay: Payer: Medicare HMO | Attending: Hematology

## 2023-05-21 VITALS — BP 105/66 | HR 61 | Temp 97.6°F | Resp 16 | Ht 66.0 in | Wt 173.0 lb

## 2023-05-21 DIAGNOSIS — Z95828 Presence of other vascular implants and grafts: Secondary | ICD-10-CM

## 2023-05-21 DIAGNOSIS — C163 Malignant neoplasm of pyloric antrum: Secondary | ICD-10-CM

## 2023-05-21 DIAGNOSIS — C61 Malignant neoplasm of prostate: Secondary | ICD-10-CM | POA: Insufficient documentation

## 2023-05-21 DIAGNOSIS — C169 Malignant neoplasm of stomach, unspecified: Secondary | ICD-10-CM | POA: Diagnosis not present

## 2023-05-21 DIAGNOSIS — D509 Iron deficiency anemia, unspecified: Secondary | ICD-10-CM | POA: Diagnosis not present

## 2023-05-21 DIAGNOSIS — D500 Iron deficiency anemia secondary to blood loss (chronic): Secondary | ICD-10-CM

## 2023-05-21 LAB — CMP (CANCER CENTER ONLY)
ALT: 13 U/L (ref 0–44)
AST: 16 U/L (ref 15–41)
Albumin: 4 g/dL (ref 3.5–5.0)
Alkaline Phosphatase: 47 U/L (ref 38–126)
Anion gap: 7 (ref 5–15)
BUN: 15 mg/dL (ref 8–23)
CO2: 26 mmol/L (ref 22–32)
Calcium: 8.9 mg/dL (ref 8.9–10.3)
Chloride: 104 mmol/L (ref 98–111)
Creatinine: 1.09 mg/dL (ref 0.61–1.24)
GFR, Estimated: >60 mL/min (ref >60)
Glucose, Bld: 116 mg/dL — ABNORMAL HIGH (ref 70–99)
Potassium: 3.7 mmol/L (ref 3.5–5.1)
Sodium: 137 mmol/L (ref 135–145)
Total Bilirubin: 0.4 mg/dL (ref 0.0–1.2)
Total Protein: 7 g/dL (ref 6.5–8.1)

## 2023-05-21 LAB — CBC WITH DIFFERENTIAL (CANCER CENTER ONLY)
Abs Immature Granulocytes: 0.02 10*3/uL (ref 0.00–0.07)
Basophils Absolute: 0 10*3/uL (ref 0.0–0.1)
Basophils Relative: 1 %
Eosinophils Absolute: 0.2 10*3/uL (ref 0.0–0.5)
Eosinophils Relative: 3 %
HCT: 32.9 % — ABNORMAL LOW (ref 39.0–52.0)
Hemoglobin: 10.6 g/dL — ABNORMAL LOW (ref 13.0–17.0)
Immature Granulocytes: 0 %
Lymphocytes Relative: 11 %
Lymphs Abs: 0.9 10*3/uL (ref 0.7–4.0)
MCH: 29.6 pg (ref 26.0–34.0)
MCHC: 32.2 g/dL (ref 30.0–36.0)
MCV: 91.9 fL (ref 80.0–100.0)
Monocytes Absolute: 0.7 10*3/uL (ref 0.1–1.0)
Monocytes Relative: 8 %
Neutro Abs: 6 10*3/uL (ref 1.7–7.7)
Neutrophils Relative %: 77 %
Platelet Count: 259 10*3/uL (ref 150–400)
RBC: 3.58 MIL/uL — ABNORMAL LOW (ref 4.22–5.81)
RDW: 17.4 % — ABNORMAL HIGH (ref 11.5–15.5)
WBC Count: 7.8 10*3/uL (ref 4.0–10.5)
nRBC: 0 % (ref 0.0–0.2)

## 2023-05-21 LAB — FERRITIN: Ferritin: 19 ng/mL — ABNORMAL LOW (ref 24–336)

## 2023-05-21 MED ORDER — HEPARIN SOD (PORK) LOCK FLUSH 100 UNIT/ML IV SOLN
500.0000 [IU] | Freq: Once | INTRAVENOUS | Status: AC
Start: 2023-05-21 — End: 2023-05-21
  Administered 2023-05-21: 500 [IU]

## 2023-05-21 MED ORDER — SODIUM CHLORIDE 0.9% FLUSH
10.0000 mL | Freq: Once | INTRAVENOUS | Status: AC
  Administered 2023-05-21: 10 mL

## 2023-05-21 NOTE — Progress Notes (Signed)
 [facility]   Telephone:([phone] Fax:(336) [phone]   Clinic Follow up Note   Patient Care Team: Renford Dills, MD as PCP - General (Internal Medicine) [name], Deloris Ping, MD as PCP - Cardiology (Cardiology) Marinus Maw, MD as PCP - Electrophysiology (Cardiology) Fritzi Mandes, MD as Consulting Physician (General Surgery) Malachy Mood, MD as Consulting Physician (Hematology) Jeani Hawking, MD as Consulting Physician (Gastroenterology)  Date of Service:  05/21/2023  CHIEF COMPLAINT: f/u of gastric cancer and anemia  CURRENT THERAPY:  Oral iron, started in February 2025  Oncology History   Malignant neoplasm of prostate [facility]) -diagnosed with Gleason 4+4 prostate cancer in early 2019. He was treated with long-term ADT in combination with 8 weeks of IMRT.   Gastric cancer (HCC) cT3N0M0, stage II -Diagnosed 11/2020, path showed loss of MLH1 and PMS2 which predicts good response to immunotherapy  -Deemed a high risk surgical candidate.  S/p FLOT4 01/02/2021 - 03/22/2021 -Posttreatment EUS 05/2021 and 06/29/2022 by Dr. Elnoria Howard showed a scar and residual erythema in stomach, biopsy negative for residual malignancy.  -Mr Suriano is clinically doing well on surveillance. Last CT 03/20/22 which shows no evidence of recurrent or metastatic disease, and stable bilateral adrenal adenomas -Surveillance CT scan from October 09, 2022 showed no evidence of recurrence -Surveillance CT scan from April 30, 2023 showed no evidence of residual or recurrent malignancy.  Small liver lesion is stable, favored to be benign. -He is clinically doing well overall, will continue monitoring.   Assessment & Plan Gastric cancer Gastric cancer diagnosed in 2022, currently in remission for two and a half years. Recent CT scan shows no evidence of recurrence. He experienced stomach pain and night sweats, leading to an ER visit, but no significant findings were identified on the CT scan or by the GI  specialist. The last endoscopy a year ago was normal. He had difficulty with chemotherapy but is currently doing well. - Follow up with GI specialist as needed.  Iron deficiency anemia Iron deficiency anemia with recent hemoglobin level of 10.6. He has been on oral iron supplements for two months, but levels remain low. Concern for potential GI bleeding as a cause of anemia, especially given his heart condition which limits anesthesia use for endoscopic procedures. Capsule endoscopy is considered as an alternative to traditional endoscopy to evaluate for GI bleeding. - Order capsule endoscopy to evaluate for GI bleeding. - Consider IV iron infusion if oral iron supplementation is insufficient. - Monitor iron levels and hemoglobin. - Consult with Dr. Renaldo Reel regarding the need for endoscopy or capsule endoscopy.  Plan -Recent lab and CT scan reviewed, NED.   -Labs reviewed, iron level still pending, if low, will recommend IV Feraheme weekly x 2 -Repeat lab in 6 weeks with port flush -Will reach out to Dr. Elnoria Howard about capsule endoscopy for his iron deficient anemia -Follow-up in 3 months with lab.   SUMMARY OF ONCOLOGIC HISTORY: Oncology History Overview Note  Cancer Staging Gastric cancer [facility]) Staging form: Stomach, AJCC 8th Edition - Clinical stage from 12/01/2020: Stage IIB (cT3, cN0, cM0) - Signed by Malachy Mood, MD on 12/20/2020 Stage prefix: Initial diagnosis Total positive nodes: 0  Malignant neoplasm of prostate (HCC) Staging form: Prostate, AJCC 8th Edition - Clinical: Stage IIC (cT2b, cN0, cM0, PSA: 6.7, Grade Group: 4) - Unsigned Prostate specific antigen (PSA) range: Less than 10 Gleason score: 8 Histologic grading system: 5 grade system    Gastric cancer (HCC)  12/01/2020 Procedure   Upper  Endoscopy, Dr. Elnoria Howard  Impression: - Normal esophagus. 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 Biopsied. - Normal examined duodenum.   12/01/2020 Pathology Results   FINAL  MICROSCOPIC DIAGNOSIS:   A. INCISURA, BIOPSY:  - Adenocarcinoma, moderate to poorly differentiated arising in a  background of chronic gastritis with intestinal metaplasia.    12/01/2020 Imaging   CT CAP  IMPRESSION: Focal wall thickening along the posterior aspect of the gastric antrum, likely corresponding to the patient's newly diagnosed gastric cancer.   No findings suspicious for metastatic disease.   Mild multifocal pneumonia, lower lobe predominant, likely on the basis of aspiration. Trace right pleural effusion.   Fiducial markers along the prostate in this patient with known prostate cancer.   12/01/2020 Cancer Staging   Staging form: Stomach, AJCC 8th Edition - Clinical stage from 12/01/2020: Stage IIB (cT3, cN0, cM0) - Signed by Malachy Mood, MD on 12/20/2020 Stage prefix: Initial diagnosis Total positive nodes: 0   12/08/2020 Initial Diagnosis   Gastric cancer (HCC)   12/16/2020 Procedure   EUS  - Wall thickening was seen in the lesser curve of the stomach and in the antrum of the stomach. The thickening appeared to be primarily within the serosa (Layer 5). T3 N0 Mx. - There was no sign of significant pathology in the entire pancreas. - There was no sign of significant pathology in the common bile duct and in the gallbladder. - There was no evidence of significant pathology in the left lobe of the liver. - Endosonographic images of the left adrenal gland were unremarkable. - The celiac trunk and superior mesenteric artery were endosonographically normal. - The mediastinum was unremarkable endosonographically. - No specimens collected.   01/02/2021 - 03/23/2021 Chemotherapy   Patient is on Treatment Plan : GASTROESOPHAGEAL FLOT q14d X 4 cycles     04/01/2021 Imaging   EXAM: CT ABDOMEN AND PELVIS WITHOUT CONTRAST  IMPRESSION: 1. No acute findings in the abdomen or pelvis. Specifically, no evidence for metastatic disease in the abdomen or pelvis. 2. Stable 16 mm  left adrenal adenoma. 3. Small fat containing hernias in the right groin and umbilical region. 4. Bilateral pars interarticularis defects at L4 with 10 mm anterolisthesis of L4 on 5, stable. 5. Aortic Atherosclerosis (ICD10-I70.0).   04/13/2021 Imaging   EXAM: CT CHEST WITHOUT CONTRAST  IMPRESSION: 1. No evidence of metastatic disease in the chest. 2. Enlargement of the main pulmonary artery, as can be seen in pulmonary hypertension. 3. Coronary artery disease.   Aortic Atherosclerosis (ICD10-I70.0).   06/09/2021 Procedure   Upper GI Endoscopy, Dr. Elnoria Howard  Findings: -The esophagus was normal. -A medium healed ulcer was found on the lesser curvature of the stomach. Biopsies were taken with a cold forceps for histology. -The examined duodenum was normal. -Compared to the prior EGD there is a marked improvement in his gastric cancer. There was only evidence of erythema and a central scar at the incisura. Cold biopsies of the area were obtained, but there was no gross evidence of malignancy.  Impression: - Normal esophagus. - Scar in the lesser curvature of the stomach. Biopsied. - Normal examined duodenum.   06/09/2021 Pathology Results   FINAL MICROSCOPIC DIAGNOSIS:   A. STOMACH, BIOPSY:  - Gastric mucosa with acute and chronic inflammation.  - No dysplasia or malignancy identified.    06/16/2021 - 06/16/2021 Chemotherapy   Patient is on Treatment Plan : GASTROESOPHAGEAL Pembrolizumab (200) q21d        Discussed the use of AI scribe  software for clinical note transcription with the patient, who gave verbal consent to proceed.  History of Present Illness The patient, a 73 year old with a history of gastric cancer, presents for a follow-up visit. The patient reports a recent visit to the emergency room due to stomach pain, which has since improved with medication. The pain was described as a mix of heartburn and cramping, with a sensation of acidity. The patient also reports  experiencing night sweats a few days after the hospital visit.  The patient's gastric cancer was diagnosed two and a half years ago and has been well-managed since then. The patient has been taking oral iron supplements for the past two to three months due to low iron levels. The patient also has a heart condition, which has been a consideration in the management of his gastric cancer.  The patient has a port for IV treatments and has been managing well with self-care, with some modifications such as a chair in the shower. The patient reports no issues with the oral iron supplements and has been adhering to a healthy diet and exercise regimen.     All other systems were reviewed with the patient and are negative.  MEDICAL HISTORY:  Past Medical History:  Diagnosis Date   AICD (automatic cardioverter/defibrillator) present 05/25/2016   biv icd   Bunion, right    Chronic combined systolic and diastolic CHF (congestive heart failure) (HCC)    Echo 1/18: Mild conc LVH, EF 15-20, severe diff HK, inf and inf-septal AK, Gr 3 DD, mild to mod MR, severe LAE, mod reduced RVSF, mod RAE, mild TR, PASP 50   Coronary artery disease involving native coronary artery without angina pectoris 04/17/2016   LHC 1/18: pLCx 30, mLCx 20, mRCA 40, dRCA 20, LVEDP 23, mean RA 8, PA 42/20, PCWP 17   Diabetes mellitus without complication (HCC)    Gastric cancer (HCC)    History of atrial fibrillation    History of atrial flutter    History of cardiomegaly 06/07/2016   Noted on CXR   History of colon polyps 06/28/2017   Noted on colonoscopy   Hypertension    LBBB (left bundle branch block)    Nausea vomiting and diarrhea 04/04/2021   NICM (nonischemic cardiomyopathy) (HCC)    Echo 1/18:  Mild conc LVH, EF 15-20, severe diff HK, inf and inf-septal AK, Gr 3 DD, mild to mod MR, severe LAE, mod reduced RVSF, mod RAE, mild TR, PASP 50   Other secondary pulmonary hypertension (HCC) 04/17/2016   Prostate cancer (HCC)  2019   Sigmoid diverticulosis 06/28/2017   Noted on colonoscopy    SURGICAL HISTORY: Past Surgical History:  Procedure Laterality Date   BIOPSY  12/01/2020   Procedure: BIOPSY;  Surgeon: Jeani Hawking, MD;  Location: [facility] ENDOSCOPY;  Service: Endoscopy;;   BIOPSY  06/09/2021   Procedure: BIOPSY;  Surgeon: Jeani Hawking, MD;  Location: [facility] ENDOSCOPY;  Service: Gastroenterology;;   BIOPSY  06/29/2022   Procedure: BIOPSY;  Surgeon: Jeani Hawking, MD;  Location: [facility] ENDOSCOPY;  Service: Gastroenterology;;   BIV ICD INSERTION CRT-D N/A 05/25/2016   Procedure: BiV ICD Insertion CRT-D;  Surgeon: Marinus Maw, MD;  Location: [facility] INVASIVE CV LAB;  Service: Cardiovascular;  Laterality: N/A;   CARDIAC CATHETERIZATION N/A 03/02/2016   Procedure: Right/Left Heart Cath and Coronary Angiography;  Surgeon: Yvonne Kendall, MD;  Location: [facility] INVASIVE CV LAB;  Service: Cardiovascular;  Laterality: N/A;   CARDIOVERSION N/A 07/17/2016   Procedure: Cardioversion;  Surgeon:  Marinus Maw, MD;  Location: [facility] INVASIVE CV LAB;  Service: Cardiovascular;  Laterality: N/A;   COLONOSCOPY WITH PROPOFOL N/A 06/28/2017   Procedure: COLONOSCOPY WITH PROPOFOL;  Surgeon: Jeani Hawking, MD;  Location: [facility] ENDOSCOPY;  Service: Endoscopy;  Laterality: N/A;   colonscopy  2009   ESOPHAGOGASTRODUODENOSCOPY N/A 12/01/2020   Procedure: ESOPHAGOGASTRODUODENOSCOPY (EGD);  Surgeon: Jeani Hawking, MD;  Location: [facility] ENDOSCOPY;  Service: Endoscopy;  Laterality: N/A;  IDA/guaiac positive stools   ESOPHAGOGASTRODUODENOSCOPY (EGD) WITH PROPOFOL N/A 12/16/2020   Procedure: ESOPHAGOGASTRODUODENOSCOPY (EGD) WITH PROPOFOL;  Surgeon: Jeani Hawking, MD;  Location: [facility] ENDOSCOPY;  Service: Endoscopy;  Laterality: N/A;   ESOPHAGOGASTRODUODENOSCOPY (EGD) WITH PROPOFOL N/A 06/09/2021   Procedure: ESOPHAGOGASTRODUODENOSCOPY (EGD) WITH PROPOFOL;  Surgeon: Jeani Hawking, MD;  Location: [facility] ENDOSCOPY;  Service: Gastroenterology;  Laterality: N/A;    ESOPHAGOGASTRODUODENOSCOPY (EGD) WITH PROPOFOL N/A 06/29/2022   Procedure: ESOPHAGOGASTRODUODENOSCOPY (EGD) WITH PROPOFOL;  Surgeon: Jeani Hawking, MD;  Location: [facility] ENDOSCOPY;  Service: Gastroenterology;  Laterality: N/A;   GOLD SEED IMPLANT N/A 12/24/2017   Procedure: GOLD SEED IMPLANT, TRANSERINEAL;  Surgeon: Jerilee Field, MD;  Location: [facility] ORS;  Service: Urology;  Laterality: N/A;   INSERT / REPLACE / REMOVE PACEMAKER     LEAD REVISION  10/10/2018   LEAD REVISION/REPAIR N/A 10/10/2018   Procedure: LEAD REVISION/REPAIR;  Surgeon: Marinus Maw, MD;  Location: [facility] INVASIVE CV LAB;  Service: Cardiovascular;  Laterality: N/A;   POLYPECTOMY  06/28/2017   Procedure: POLYPECTOMY;  Surgeon: Jeani Hawking, MD;  Location: [facility] ENDOSCOPY;  Service: Endoscopy;;  ascending and descending colon polyp   PORTACATH PLACEMENT Right 12/23/2020   Procedure: INSERTION PORT-A-CATH;  Surgeon: Fritzi Mandes, MD;  Location: [facility] OR;  Service: General;  Laterality: Right;   PROSTATE BIOPSY  02/20/2017   RIGHT/LEFT HEART CATH AND CORONARY ANGIOGRAPHY N/A 10/30/2022   Procedure: RIGHT/LEFT HEART CATH AND CORONARY ANGIOGRAPHY;  Surgeon: Laurey Morale, MD;  Location: [facility] INVASIVE CV LAB;  Service: Cardiovascular;  Laterality: N/A;   SPACE OAR INSTILLATION N/A 12/24/2017   Procedure: SPACE OAR INSTILLATION;  Surgeon: Jerilee Field, MD;  Location: [facility] ORS;  Service: Urology;  Laterality: N/A;   TOTAL KNEE ARTHROPLASTY Right 08/16/2017   Procedure: RIGHT TOTAL KNEE ARTHROPLASTY;  Surgeon: Frederico Hamman, MD;  Location: [facility] OR;  Service: Orthopedics;  Laterality: Right;   UPPER ESOPHAGEAL ENDOSCOPIC ULTRASOUND (EUS) N/A 12/16/2020   Procedure: UPPER ESOPHAGEAL ENDOSCOPIC ULTRASOUND (EUS);  Surgeon: Jeani Hawking, MD;  Location: [facility] ENDOSCOPY;  Service: Endoscopy;  Laterality: N/A;    I have reviewed the social history and family history with the patient and they are unchanged from previous note.  ALLERGIES:  is allergic  to aspirin and sulfa antibiotics.  MEDICATIONS:  Current Outpatient Medications  Medication Sig Dispense Refill   atorvastatin (LIPITOR) 40 MG tablet Take 1 tablet (40 mg total) by mouth daily. 90 tablet 3   Biotin 1000 MCG tablet Take 1,000 mcg by mouth daily with breakfast.     cholecalciferol (VITAMIN D3) 25 MCG (1000 UT) tablet Take 1,000 Units by mouth daily with breakfast.     digoxin (LANOXIN) 0.125 MG tablet Take 1/2 tablet (0.0625mg  total) by mouth daily. 15 tablet 6   empagliflozin (JARDIANCE) 10 MG TABS tablet Take 1 tablet (10 mg total) by mouth daily. 90 tablet 3   famotidine (PEPCID) 40 MG tablet Take 1 tablet (40 mg total) by mouth daily for 14 days. 14 tablet 0   furosemide (LASIX) 40 MG tablet Take 1 tablet (40 mg  total) by mouth daily. 30 tablet 6   gabapentin (NEURONTIN) 100 MG capsule Take 1 capsule (100 mg total) by mouth 2 (two) times daily. 180 capsule 1   ketoconazole (NIZORAL) 2 % cream Apply 1 Application topically 2 (two) times daily. (Patient taking differently: Apply 1 Application topically 2 (two) times daily. As needed) 30 g 3   lidocaine-prilocaine (EMLA) cream Apply 1 Application topically as needed. 30 g 0   loratadine (CLARITIN) 10 MG tablet Take 10 mg by mouth daily.     metFORMIN (GLUCOPHAGE-XR) 500 MG 24 hr tablet Take 1 tablet (500 mg total) by mouth with evening meal 90 tablet 3   metoprolol succinate (TOPROL-XL) 25 MG 24 hr tablet Take 1 tablet (25 mg total) by mouth daily. 90 tablet 3   multivitamin (ONE-A-DAY MEN'S) TABS tablet Take 1 tablet by mouth daily with breakfast.     Naftifine HCl (NAFTIN) 2 % GEL Apply 1 application  topically every Monday, Wednesday, and Friday.     pantoprazole (PROTONIX) 40 MG tablet Take 1 tablet (40 mg total) by mouth 2 (two) times daily. 180 tablet 3   potassium chloride (KLOR-CON) 10 MEQ tablet Take 2 tablets (20 mEq total) by mouth daily. 60 tablet 3   rivaroxaban (XARELTO) 20 MG TABS tablet Take 1 tablet (20 mg  total) by mouth daily with supper. 90 tablet 3   sacubitril-valsartan (ENTRESTO) 24-26 MG Take 1 tablet by mouth 2 (two) times daily. 180 tablet 1   spironolactone (ALDACTONE) 25 MG tablet Take 1 tablet (25 mg total) by mouth daily. 90 tablet 3   sucralfate (CARAFATE) 1 g tablet Take 1 tablet (1 g total) by mouth 4 (four) times daily -  with meals and at bedtime. Take as needed 30 tablet 0   Tavaborole 5 % SOLN Apply 1 application  topically daily. Nail on left hand     No current facility-administered medications for this visit.    PHYSICAL EXAMINATION: ECOG PERFORMANCE STATUS: 1 - Symptomatic but completely ambulatory  Vitals:   05/21/23 0938  BP: 105/66  Pulse: 61  Resp: 16  Temp: 97.6 F (36.4 C)  SpO2: 95%   Wt Readings from Last 3 Encounters:  05/21/23 173 lb (78.5 kg)  05/14/23 173 lb (78.5 kg)  05/07/23 172 lb (78 kg)     GENERAL:alert, no distress and comfortable SKIN: skin color, texture, turgor are normal, no rashes or significant lesions EYES: normal, Conjunctiva are pink and non-injected, sclera clear NECK: supple, thyroid normal size, non-tender, without nodularity LYMPH:  no palpable lymphadenopathy in the cervical, axillary  LUNGS: clear to auscultation and percussion with normal breathing effort HEART: regular rate & rhythm and no murmurs and no lower extremity edema ABDOMEN:abdomen soft, non-tender and normal bowel sounds Musculoskeletal:no cyanosis of digits and no clubbing  NEURO: alert & oriented x 3 with fluent speech, no focal motor/sensory deficits  Physical Exam    LABORATORY DATA:  I have reviewed the data as listed    Latest Ref Rng & Units 05/21/2023    9:27 AM 05/14/2023   10:28 AM 04/03/2023    9:11 AM  CBC  WBC 4.0 - 10.5 K/uL 7.8  8.1  6.5   Hemoglobin 13.0 - 17.0 g/dL 16.1  09.6  04.5   Hematocrit 39.0 - 52.0 % 32.9  34.3  35.3   Platelets 150 - 400 K/uL 259  245  241         Latest Ref Rng & Units 05/21/2023  9:27 AM 05/14/2023    10:28 AM 05/07/2023    8:54 AM  CMP  Glucose 70 - 99 mg/dL 951  96  96   BUN 8 - 23 mg/dL 15  16  25    Creatinine 0.61 - 1.24 mg/dL 8.84  1.66  0.63   Sodium 135 - 145 mmol/L 137  136  137   Potassium 3.5 - 5.1 mmol/L 3.7  4.4  3.9   Chloride 98 - 111 mmol/L 104  103  104   CO2 22 - 32 mmol/L 26  20  25    Calcium 8.9 - 10.3 mg/dL 8.9  9.0  9.3   Total Protein 6.5 - 8.1 g/dL 7.0  6.2    Total Bilirubin 0.0 - 1.2 mg/dL 0.4  0.7    Alkaline Phos 38 - 126 U/L 47  42    AST 15 - 41 U/L 16  28    ALT 0 - 44 U/L 13  17        RADIOGRAPHIC STUDIES: I have personally reviewed the radiological images as listed and agreed with the findings in the report. No results found.    No orders of the defined types were placed in this encounter.  All questions were answered. The patient knows to call the clinic with any problems, questions or concerns. No barriers to learning was detected. The total time spent in the appointment was 30 minutes.     Malachy Mood, MD 05/21/2023

## 2023-05-22 ENCOUNTER — Other Ambulatory Visit: Payer: Self-pay

## 2023-05-22 NOTE — Progress Notes (Signed)
 Verbal order w/readback from Dr. Mosetta Putt to order Feraheme 510mg  x 2 doses at [facility].  Order placed and pt made aware of the order for IV iron.

## 2023-05-24 ENCOUNTER — Telehealth: Payer: Self-pay

## 2023-05-24 ENCOUNTER — Other Ambulatory Visit: Payer: Self-pay

## 2023-05-24 NOTE — Telephone Encounter (Signed)
 Dr. Mosetta Putt and Olegario Shearer, patient will be scheduled as soon as possible.  Auth Submission: NO AUTH NEEDED Site of care: Site of care: [facility] Payer: [employer] medicare Medication & CPT/J Code(s) submitted: Venofer (Iron Sucrose) J1756 Route of submission (phone, fax, portal):  Phone # Fax # Auth type: Buy/Bill PB Units/visits requested: 200mg  x 5 doses Reference number:  Approval from: 05/24/23 to 09/23/23

## 2023-05-27 ENCOUNTER — Other Ambulatory Visit (HOSPITAL_COMMUNITY): Payer: Self-pay

## 2023-05-27 MED ORDER — ACCU-CHEK GUIDE TEST VI STRP
ORAL_STRIP | Freq: Every day | 3 refills | Status: AC
  Filled 2023-05-27: qty 100, 90d supply, fill #0
  Filled 2023-08-19: qty 100, 90d supply, fill #1
  Filled 2023-11-18 (×3): qty 100, 90d supply, fill #2
  Filled 2024-02-17: qty 100, 90d supply, fill #3

## 2023-05-27 MED ORDER — ACCU-CHEK SOFTCLIX LANCETS MISC
3 refills | Status: AC
  Filled 2023-05-27: qty 100, 90d supply, fill #0
  Filled 2023-10-29: qty 100, 90d supply, fill #1

## 2023-05-27 MED ORDER — ACCU-CHEK GUIDE W/DEVICE KIT
PACK | 0 refills | Status: AC
  Filled 2023-05-27: qty 1, 30d supply, fill #0

## 2023-05-28 ENCOUNTER — Ambulatory Visit

## 2023-05-28 VITALS — BP 91/62 | HR 72 | Temp 97.6°F | Resp 12 | Ht 66.0 in | Wt 170.4 lb

## 2023-05-28 DIAGNOSIS — D649 Anemia, unspecified: Secondary | ICD-10-CM

## 2023-05-28 DIAGNOSIS — C61 Malignant neoplasm of prostate: Secondary | ICD-10-CM

## 2023-05-28 DIAGNOSIS — D509 Iron deficiency anemia, unspecified: Secondary | ICD-10-CM

## 2023-05-28 DIAGNOSIS — D500 Iron deficiency anemia secondary to blood loss (chronic): Secondary | ICD-10-CM

## 2023-05-28 MED ORDER — IRON SUCROSE 20 MG/ML IV SOLN
200.0000 mg | Freq: Once | INTRAVENOUS | Status: AC
Start: 2023-05-28 — End: 2023-05-28
  Administered 2023-05-28: 200 mg via INTRAVENOUS
  Filled 2023-05-28: qty 10

## 2023-05-28 MED ORDER — ACETAMINOPHEN 325 MG PO TABS
650.0000 mg | ORAL_TABLET | Freq: Once | ORAL | Status: AC
  Administered 2023-05-28: 650 mg via ORAL
  Filled 2023-05-28: qty 2

## 2023-05-28 MED ORDER — HEPARIN SOD (PORK) LOCK FLUSH 100 UNIT/ML IV SOLN
500.0000 [IU] | Freq: Once | INTRAVENOUS | Status: AC | PRN
  Administered 2023-05-28: 500 [IU]
  Filled 2023-05-28: qty 5

## 2023-05-28 MED ORDER — DIPHENHYDRAMINE HCL 25 MG PO CAPS
25.0000 mg | ORAL_CAPSULE | Freq: Once | ORAL | Status: AC
  Administered 2023-05-28: 25 mg via ORAL
  Filled 2023-05-28: qty 1

## 2023-05-28 NOTE — Progress Notes (Signed)
 Diagnosis: Iron Deficiency Anemia  Provider:  Mannam, Praveen MD  Procedure: IV Push  IV Type: Port a Cath, IV Location: R Chest  Venofer (Iron Sucrose), Dose: 200 mg  Post Infusion IV Care: Observation period completed and Port a Cath Deaccessed/Flushed and heparin locked.  Discharge: Condition: Stable, Destination: Home . AVS Provided  Performed by:  Lendel Quant, RN

## 2023-05-30 ENCOUNTER — Ambulatory Visit

## 2023-05-30 VITALS — BP 92/60 | HR 61 | Temp 97.4°F | Resp 20 | Ht 66.0 in | Wt 169.0 lb

## 2023-05-30 DIAGNOSIS — D649 Anemia, unspecified: Secondary | ICD-10-CM

## 2023-05-30 DIAGNOSIS — D509 Iron deficiency anemia, unspecified: Secondary | ICD-10-CM

## 2023-05-30 DIAGNOSIS — D500 Iron deficiency anemia secondary to blood loss (chronic): Secondary | ICD-10-CM

## 2023-05-30 DIAGNOSIS — C61 Malignant neoplasm of prostate: Secondary | ICD-10-CM

## 2023-05-30 MED ORDER — IRON SUCROSE 20 MG/ML IV SOLN
200.0000 mg | Freq: Once | INTRAVENOUS | Status: AC
  Administered 2023-05-30: 200 mg via INTRAVENOUS
  Filled 2023-05-30: qty 10

## 2023-05-30 MED ORDER — DIPHENHYDRAMINE HCL 25 MG PO CAPS
25.0000 mg | ORAL_CAPSULE | Freq: Once | ORAL | Status: AC
Start: 2023-05-30 — End: 2023-05-30
  Administered 2023-05-30: 25 mg via ORAL
  Filled 2023-05-30: qty 1

## 2023-05-30 MED ORDER — ACETAMINOPHEN 325 MG PO TABS
650.0000 mg | ORAL_TABLET | Freq: Once | ORAL | Status: AC
  Administered 2023-05-30: 650 mg via ORAL
  Filled 2023-05-30: qty 2

## 2023-05-30 MED ORDER — HEPARIN SOD (PORK) LOCK FLUSH 100 UNIT/ML IV SOLN
500.0000 [IU] | Freq: Once | INTRAVENOUS | Status: AC | PRN
  Administered 2023-05-30: 500 [IU]
  Filled 2023-05-30: qty 5

## 2023-05-30 NOTE — Progress Notes (Signed)
 Diagnosis: Acute Anemia  Provider:  Phyllis Breeze MD  Procedure: IV Push  IV Type: Port a Cath, IV Location: R Chest  Venofer (Iron Sucrose), Dose: 200 mg  Post Infusion IV Care: Observation period completed and Port a Cath Deaccessed/Flushed  Discharge: Condition: Good, Destination: Home . AVS Provided  Performed by:  Shirly Dow, RN

## 2023-05-31 ENCOUNTER — Other Ambulatory Visit (HOSPITAL_COMMUNITY): Payer: Self-pay

## 2023-06-03 ENCOUNTER — Ambulatory Visit

## 2023-06-03 VITALS — BP 103/68 | HR 65 | Temp 97.6°F | Resp 20 | Ht 66.0 in | Wt 167.0 lb

## 2023-06-03 DIAGNOSIS — C61 Malignant neoplasm of prostate: Secondary | ICD-10-CM

## 2023-06-03 DIAGNOSIS — D509 Iron deficiency anemia, unspecified: Secondary | ICD-10-CM

## 2023-06-03 DIAGNOSIS — D500 Iron deficiency anemia secondary to blood loss (chronic): Secondary | ICD-10-CM

## 2023-06-03 DIAGNOSIS — D649 Anemia, unspecified: Secondary | ICD-10-CM

## 2023-06-03 MED ORDER — ACETAMINOPHEN 325 MG PO TABS
650.0000 mg | ORAL_TABLET | Freq: Once | ORAL | Status: AC
  Administered 2023-06-03: 650 mg via ORAL
  Filled 2023-06-03: qty 2

## 2023-06-03 MED ORDER — DIPHENHYDRAMINE HCL 25 MG PO CAPS
25.0000 mg | ORAL_CAPSULE | Freq: Once | ORAL | Status: AC
  Administered 2023-06-03: 25 mg via ORAL
  Filled 2023-06-03: qty 1

## 2023-06-03 MED ORDER — IRON SUCROSE 20 MG/ML IV SOLN
200.0000 mg | Freq: Once | INTRAVENOUS | Status: AC
  Administered 2023-06-03: 200 mg via INTRAVENOUS
  Filled 2023-06-03: qty 10

## 2023-06-03 MED ORDER — HEPARIN SOD (PORK) LOCK FLUSH 100 UNIT/ML IV SOLN
500.0000 [IU] | Freq: Once | INTRAVENOUS | Status: AC | PRN
  Administered 2023-06-03: 500 [IU]
  Filled 2023-06-03: qty 5

## 2023-06-03 NOTE — Progress Notes (Signed)
 Diagnosis: Iron  Deficiency Anemia  Provider:  Phyllis Breeze MD  Procedure: IV Infusion  IV Type: Port a Cath, IV Location: R Chest  Venofer  (Iron  Sucrose), Dose: 200 mg IVP  Post Infusion IV Care: Observation period completed and Peripheral IV Discontinued  Discharge: Condition: Good, Destination: Home . AVS Provided  Performed by:  Gennett Garcia, RN

## 2023-06-05 ENCOUNTER — Ambulatory Visit: Attending: Internal Medicine | Admitting: Internal Medicine

## 2023-06-05 ENCOUNTER — Ambulatory Visit

## 2023-06-05 ENCOUNTER — Other Ambulatory Visit (HOSPITAL_COMMUNITY): Payer: Self-pay

## 2023-06-05 VITALS — BP 88/50 | HR 64 | Temp 97.5°F | Resp 16 | Ht 66.0 in | Wt 166.4 lb

## 2023-06-05 VITALS — BP 102/62 | HR 68 | Ht 66.0 in | Wt 167.6 lb

## 2023-06-05 DIAGNOSIS — I10 Essential (primary) hypertension: Secondary | ICD-10-CM | POA: Diagnosis not present

## 2023-06-05 DIAGNOSIS — I5043 Acute on chronic combined systolic (congestive) and diastolic (congestive) heart failure: Secondary | ICD-10-CM | POA: Diagnosis not present

## 2023-06-05 DIAGNOSIS — I4819 Other persistent atrial fibrillation: Secondary | ICD-10-CM | POA: Diagnosis not present

## 2023-06-05 DIAGNOSIS — I2510 Atherosclerotic heart disease of native coronary artery without angina pectoris: Secondary | ICD-10-CM | POA: Diagnosis not present

## 2023-06-05 DIAGNOSIS — I428 Other cardiomyopathies: Secondary | ICD-10-CM | POA: Diagnosis not present

## 2023-06-05 DIAGNOSIS — I447 Left bundle-branch block, unspecified: Secondary | ICD-10-CM

## 2023-06-05 DIAGNOSIS — D649 Anemia, unspecified: Secondary | ICD-10-CM

## 2023-06-05 DIAGNOSIS — D509 Iron deficiency anemia, unspecified: Secondary | ICD-10-CM | POA: Diagnosis not present

## 2023-06-05 DIAGNOSIS — D500 Iron deficiency anemia secondary to blood loss (chronic): Secondary | ICD-10-CM

## 2023-06-05 DIAGNOSIS — C61 Malignant neoplasm of prostate: Secondary | ICD-10-CM

## 2023-06-05 MED ORDER — IRON SUCROSE 20 MG/ML IV SOLN
200.0000 mg | Freq: Once | INTRAVENOUS | Status: AC
Start: 2023-06-05 — End: 2023-06-05
  Administered 2023-06-05: 200 mg via INTRAVENOUS
  Filled 2023-06-05: qty 10

## 2023-06-05 MED ORDER — DIPHENHYDRAMINE HCL 25 MG PO CAPS
25.0000 mg | ORAL_CAPSULE | Freq: Once | ORAL | Status: AC
  Administered 2023-06-05: 25 mg via ORAL
  Filled 2023-06-05: qty 1

## 2023-06-05 MED ORDER — HEPARIN SOD (PORK) LOCK FLUSH 100 UNIT/ML IV SOLN
500.0000 [IU] | Freq: Once | INTRAVENOUS | Status: AC | PRN
  Administered 2023-06-05: 500 [IU]

## 2023-06-05 MED ORDER — ACETAMINOPHEN 325 MG PO TABS
650.0000 mg | ORAL_TABLET | Freq: Once | ORAL | Status: AC
Start: 2023-06-05 — End: 2023-06-05
  Administered 2023-06-05: 650 mg via ORAL
  Filled 2023-06-05: qty 2

## 2023-06-05 NOTE — Patient Instructions (Addendum)
 Medication Instructions:  Your physician recommends that you continue on your current medications as directed. Please refer to the Current Medication list given to you today.  *If you need a refill on your cardiac medications before your next appointment, please call your pharmacy*  Lab Work: None ordered.  You may go to any [facility] Location for your lab work:  [facility] - [address] ([facility]) - [phone]  [place] - [address]   [place]  - [address]   [place] - [address] - [address] [employer]  - [address] [place] - [address] ([facility]   If you have labs (blood work) drawn today and your tests are completely normal, you will receive your results only by: [employer] (if you have MyChart)  If you have any lab test that is abnormal or we need to change your treatment, we will call you or send a MyChart message to review the results.  Testing/Procedures: None ordered.  Follow-Up: At [facility], you and your health needs are our priority.  As part of our continuing mission to provide you with exceptional heart care, we have created designated Provider Care Teams.  These Care Teams include your primary Cardiologist (physician) and Advanced Practice Providers (APPs -  Physician Assistants and Nurse Practitioners) who all work together to provide you with the care you need, when you need it.  Your next appointment:   1 year  The format for your next appointment:   In Person  Provider:   Manya Sells, MD{or one of the following Advanced Practice Providers on your designated Care Team:   Mertha Abrahams, [place] Bambi Lever "Jonelle Neri" [place], [place] Neda Balk, NP  Note: Remote monitoring is used to monitor your Pacemaker/ ICD from home. This monitoring reduces the number of office visits required to check your  device to one time per year. It allows us  to keep an eye on the functioning of your device to ensure it is working properly.            Valet parking services will be available as well.

## 2023-06-05 NOTE — Progress Notes (Signed)
 Diagnosis: Iron  Deficiency Anemia  Provider:  Praveen Mannam MD  Procedure: IV Push  IV Type: Port a Cath, IV Location: R Chest  Venofer  (Iron  Sucrose), Dose: 200 mg  Post Infusion IV Care: Observation period completed and Port a Cath Deaccessed/Flushedan heparin  locked. Pt BP is in lower side.Denies any symptoms.  Pt and wife stated their Md is aware of this.Advised them to contact for further evaluation. Verbalized understanding.  Discharge: Condition: Good, Destination: Home . AVS Declined  Performed by:  Tyniya Kuyper, RN

## 2023-06-05 NOTE — Progress Notes (Signed)
 HPI Jon Williams returns today for followup. He is a pleasant 73 yo man with a h/o non-ischemic CM chronic systolic heart failure and a diagnosis of gastric CA for which he has undergone port placement and is receiving chemo therapy. He has had a h/o atrial fib. He feels well. He is no longer on chemo but undergoing surviellance.  He denies chest pain or sob. No ICD therapies.  He has had episodes of sob and CHF admits and has pacing induced LBBB with a QRS of 190. At the time of his attempted revision in 2020, he had no coronary veins for LV lead insertion. We did not know about left bundle pacing for resynch at that time. He presents today to consider additional options.  Allergies  Allergen Reactions   Aspirin  Anaphylaxis and Hives   Sulfa Antibiotics Anaphylaxis, Hives, Swelling and Other (See Comments)    Swollen lips     Current Outpatient Medications  Medication Sig Dispense Refill   Accu-Chek Softclix Lancets lancets Use to check your blood sugar finger stick once a day Dx Code E11.9 90 days 100 each 3   atorvastatin  (LIPITOR) 40 MG tablet Take 1 tablet (40 mg total) by mouth daily. 90 tablet 3   Biotin 1000 MCG tablet Take 1,000 mcg by mouth daily with breakfast.     Blood Glucose Monitoring Suppl (ACCU-CHEK GUIDE) w/Device KIT as directed to check blood sugar once a day 1 kit 0   cholecalciferol  (VITAMIN D3) 25 MCG (1000 UT) tablet Take 1,000 Units by mouth daily with breakfast.     digoxin  (LANOXIN ) 0.125 MG tablet Take 1/2 tablet (0.0625mg  total) by mouth daily. 15 tablet 6   empagliflozin  (JARDIANCE ) 10 MG TABS tablet Take 1 tablet (10 mg total) by mouth daily. 90 tablet 3   furosemide  (LASIX ) 40 MG tablet Take 1 tablet (40 mg total) by mouth daily. 30 tablet 6   gabapentin  (NEURONTIN ) 100 MG capsule Take 1 capsule (100 mg total) by mouth 2 (two) times daily. 180 capsule 1   glucose blood (ACCU-CHEK GUIDE TEST) test strip Use to check blood sugars once daily. 100 each 3    ketoconazole  (NIZORAL ) 2 % cream Apply 1 Application topically 2 (two) times daily. (Patient taking differently: Apply 1 Application topically 2 (two) times daily. As needed) 30 g 3   lidocaine -prilocaine  (EMLA ) cream Apply 1 Application topically as needed. 30 g 0   loratadine  (CLARITIN ) 10 MG tablet Take 10 mg by mouth daily.     metFORMIN  (GLUCOPHAGE -XR) 500 MG 24 hr tablet Take 1 tablet (500 mg total) by mouth with evening meal 90 tablet 3   metoprolol  succinate (TOPROL -XL) 25 MG 24 hr tablet Take 1 tablet (25 mg total) by mouth daily. 90 tablet 3   multivitamin (ONE-A-DAY MEN'S) TABS tablet Take 1 tablet by mouth daily with breakfast.     Naftifine HCl (NAFTIN) 2 % GEL Apply 1 application  topically every Monday, Wednesday, and Friday.     pantoprazole  (PROTONIX ) 40 MG tablet Take 1 tablet (40 mg total) by mouth 2 (two) times daily. 180 tablet 3   potassium chloride  (KLOR-CON ) 10 MEQ tablet Take 2 tablets (20 mEq total) by mouth daily. 60 tablet 3   rivaroxaban  (XARELTO ) 20 MG TABS tablet Take 1 tablet (20 mg total) by mouth daily with supper. 90 tablet 3   sacubitril -valsartan  (ENTRESTO ) 24-26 MG Take 1 tablet by mouth 2 (two) times daily. 180 tablet 1   spironolactone  (ALDACTONE )  25 MG tablet Take 1 tablet (25 mg total) by mouth daily. 90 tablet 3   sucralfate  (CARAFATE ) 1 g tablet Take 1 tablet (1 g total) by mouth 4 (four) times daily -  with meals and at bedtime. Take as needed 30 tablet 0   Tavaborole 5 % SOLN Apply 1 application  topically daily. Nail on left hand     famotidine  (PEPCID ) 40 MG tablet Take 1 tablet (40 mg total) by mouth daily for 14 days. 14 tablet 0   No current facility-administered medications for this visit.     Past Medical History:  Diagnosis Date   AICD (automatic cardioverter/defibrillator) present 05/25/2016   biv icd   Bunion, right    Chronic combined systolic and diastolic CHF (congestive heart failure) (HCC)    Echo 1/18: Mild conc LVH, EF 15-20,  severe diff HK, inf and inf-septal AK, Gr 3 DD, mild to mod MR, severe LAE, mod reduced RVSF, mod RAE, mild TR, PASP 50   Coronary artery disease involving native coronary artery without angina pectoris 04/17/2016   LHC 1/18: pLCx 30, mLCx 20, mRCA 40, dRCA 20, LVEDP 23, mean RA 8, PA 42/20, PCWP 17   Diabetes mellitus without complication (HCC)    Gastric cancer (HCC)    History of atrial fibrillation    History of atrial flutter    History of cardiomegaly 06/07/2016   Noted on CXR   History of colon polyps 06/28/2017   Noted on colonoscopy   Hypertension    LBBB (left bundle branch block)    Nausea vomiting and diarrhea 04/04/2021   NICM (nonischemic cardiomyopathy) (HCC)    Echo 1/18:  Mild conc LVH, EF 15-20, severe diff HK, inf and inf-septal AK, Gr 3 DD, mild to mod MR, severe LAE, mod reduced RVSF, mod RAE, mild TR, PASP 50   Other secondary pulmonary hypertension (HCC) 04/17/2016   Prostate cancer (HCC) 2019   Sigmoid diverticulosis 06/28/2017   Noted on colonoscopy    ROS:   All systems reviewed and negative except as noted in the HPI.   Past Surgical History:  Procedure Laterality Date   BIOPSY  12/01/2020   Procedure: BIOPSY;  Surgeon: Alvis Jourdain, MD;  Location: [facility] ENDOSCOPY;  Service: Endoscopy;;   BIOPSY  06/09/2021   Procedure: BIOPSY;  Surgeon: Alvis Jourdain, MD;  Location: [facility];  Service: Gastroenterology;;   BIOPSY  06/29/2022   Procedure: BIOPSY;  Surgeon: Alvis Jourdain, MD;  Location: [facility];  Service: Gastroenterology;;   BIV ICD INSERTION CRT-D N/A 05/25/2016   Procedure: BiV ICD Insertion CRT-D;  Surgeon: Tammie Fall, MD;  Location: [facility] INVASIVE CV LAB;  Service: Cardiovascular;  Laterality: N/A;   CARDIAC CATHETERIZATION N/A 03/02/2016   Procedure: Right/Left Heart Cath and Coronary Angiography;  Surgeon: Sammy Crisp, MD;  Location: [facility] INVASIVE CV LAB;  Service: Cardiovascular;  Laterality: N/A;   CARDIOVERSION N/A 07/17/2016    Procedure: Cardioversion;  Surgeon: Tammie Fall, MD;  Location: [facility] INVASIVE CV LAB;  Service: Cardiovascular;  Laterality: N/A;   COLONOSCOPY WITH PROPOFOL  N/A 06/28/2017   Procedure: COLONOSCOPY WITH PROPOFOL ;  Surgeon: Alvis Jourdain, MD;  Location: [facility] ENDOSCOPY;  Service: Endoscopy;  Laterality: N/A;   colonscopy  2009   ESOPHAGOGASTRODUODENOSCOPY N/A 12/01/2020   Procedure: ESOPHAGOGASTRODUODENOSCOPY (EGD);  Surgeon: Alvis Jourdain, MD;  Location: [facility] ENDOSCOPY;  Service: Endoscopy;  Laterality: N/A;  IDA/guaiac positive stools   ESOPHAGOGASTRODUODENOSCOPY (EGD) WITH PROPOFOL  N/A 12/16/2020   Procedure: ESOPHAGOGASTRODUODENOSCOPY (EGD) WITH PROPOFOL ;  Surgeon: Alvis Jourdain, MD;  Location: [facility];  Service: Endoscopy;  Laterality: N/A;   ESOPHAGOGASTRODUODENOSCOPY (EGD) WITH PROPOFOL  N/A 06/09/2021   Procedure: ESOPHAGOGASTRODUODENOSCOPY (EGD) WITH PROPOFOL ;  Surgeon: Alvis Jourdain, MD;  Location: [facility] ENDOSCOPY;  Service: Gastroenterology;  Laterality: N/A;   ESOPHAGOGASTRODUODENOSCOPY (EGD) WITH PROPOFOL  N/A 06/29/2022   Procedure: ESOPHAGOGASTRODUODENOSCOPY (EGD) WITH PROPOFOL ;  Surgeon: Alvis Jourdain, MD;  Location: [facility] ENDOSCOPY;  Service: Gastroenterology;  Laterality: N/A;   GOLD SEED IMPLANT N/A 12/24/2017   Procedure: GOLD SEED IMPLANT, TRANSERINEAL;  Surgeon: Christina Coyer, MD;  Location: [facility] ORS;  Service: Urology;  Laterality: N/A;   INSERT / REPLACE / REMOVE PACEMAKER     LEAD REVISION  10/10/2018   LEAD REVISION/REPAIR N/A 10/10/2018   Procedure: LEAD REVISION/REPAIR;  Surgeon: Tammie Fall, MD;  Location: [facility] INVASIVE CV LAB;  Service: Cardiovascular;  Laterality: N/A;   POLYPECTOMY  06/28/2017   Procedure: POLYPECTOMY;  Surgeon: Alvis Jourdain, MD;  Location: [facility] ENDOSCOPY;  Service: Endoscopy;;  ascending and descending colon polyp   PORTACATH PLACEMENT Right 12/23/2020   Procedure: INSERTION PORT-A-CATH;  Surgeon: Lujean Sake, MD;  Location[facility] OR;  Service: General;   Laterality: Right;   PROSTATE BIOPSY  02/20/2017   RIGHT/LEFT HEART CATH AND CORONARY ANGIOGRAPHY N/A 10/30/2022   Procedure: RIGHT/LEFT HEART CATH AND CORONARY ANGIOGRAPHY;  Surgeon: Darlis Eisenmenger, MD;  Location: [facility] INVASIVE CV LAB;  Service: Cardiovascular;  Laterality: N/A;   SPACE OAR INSTILLATION N/A 12/24/2017   Procedure: SPACE OAR INSTILLATION;  Surgeon: Christina Coyer, MD;  Location: [facility] ORS;  Service: Urology;  Laterality: N/A;   TOTAL KNEE ARTHROPLASTY Right 08/16/2017   Procedure: RIGHT TOTAL KNEE ARTHROPLASTY;  Surgeon: Marlena Sima, MD;  Location: [facility] OR;  Service: Orthopedics;  Laterality: Right;   UPPER ESOPHAGEAL ENDOSCOPIC ULTRASOUND (EUS) N/A 12/16/2020   Procedure: UPPER ESOPHAGEAL ENDOSCOPIC ULTRASOUND (EUS);  Surgeon: Alvis Jourdain, MD;  Location: [facility];  Service: Endoscopy;  Laterality: N/A;     Family History  Problem Relation Age of Onset   Hypertension Mother    Heart disease Mother    Diabetes Mother    Diabetes Father    Hypertension Father    Cancer Father        lung cancer   Healthy Sister    Heart attack Brother    Heart disease Brother 70       + tobacco   Healthy Brother      Social History   Socioeconomic History   Marital status: Married    Spouse name: Not on file   Number of children: 2   Years of education: Not on file   Highest education level: Not on file  Occupational History   Occupation: retired  Tobacco Use   Smoking status: Never   Smokeless tobacco: Never  Vaping Use   Vaping status: Never Used  Substance and Sexual Activity   Alcohol use: No   Drug use: No   Sexual activity: Not Currently  Other Topics Concern   Not on file  Social History Narrative   Retired Location manager. Married to Mrs. Jerie [place]. Daughter, Sharri Dee, lives in [place] . Son, Euel Herring, lives in [place] .   Social Drivers of Corporate investment banker Strain: Low Risk  (12/26/2021)   Overall Financial Resource Strain (CARDIA)     Difficulty of Paying Living Expenses: Not very hard  Food Insecurity: No Food Insecurity (10/29/2022)   Hunger Vital Sign    Worried About Running Out of Food  in the Last Year: Never true    Ran Out of Food in the Last Year: Never true  Transportation Needs: No Transportation Needs (10/29/2022)   PRAPARE - Administrator, Civil Service (Medical): No    Lack of Transportation (Non-Medical): No  Physical Activity: Insufficiently Active (09/26/2021)   Exercise Vital Sign    Days of Exercise per Week: 7 days    Minutes of Exercise per Session: 10 min  Stress: No Stress Concern Present (09/26/2021)   [employer] of Occupational Health - Occupational Stress Questionnaire    Feeling of Stress : Not at all  Social Connections: Not on file  Intimate Partner Violence: Not At Risk (10/29/2022)   Humiliation, Afraid, Rape, and Kick questionnaire    Fear of Current or Ex-Partner: No    Emotionally Abused: No    Physically Abused: No    Sexually Abused: No     BP 102/62 (BP Location: Left Arm, Patient Position: Sitting, Cuff Size: Normal)   Pulse 68   Ht 5\' 6"  (1.676 m)   Wt 167 lb 9.6 oz (76 kg)   SpO2 99%   BMI 27.05 kg/m   Physical Exam:  Well appearing 73 yo man, NAD HEENT: Unremarkable Neck:  No JVD, no thyromegally Lymphatics:  No adenopathy Back:  No CVA tenderness Lungs:  Clear with no wheezes HEART:  Regular rate rhythm, no murmurs, no rubs, no clicks Abd:  soft, positive bowel sounds, no organomegally, no rebound, no guarding Ext:  2 plus pulses, no edema, no cyanosis, no clubbing Skin:  No rashes no nodules Neuro:  CN II through XII intact, motor grossly intact  EKG - both show underlying afib with baseline LBBB and pacing induced LBBB.  DEVICE  Normal device function.  See PaceArt for details.   Assess/Plan:  Atrial fib - his VR is well controlled. No change in his meds.  Chronic systolic heart failure - his symptoms remain class 2. No change in  meds. ICD - his St. Jude ICD is working normally. He has over 2 years of battery longevity. CAD - he denies anginal symptoms we will follow.  Discussion: I discussed the feasibility of another extraction (atrial lead) and insertion of a Left bundle area lead. He denies much in the way of CHF symptoms. At this point I do not think that the risk/benefit is in favor of another operation. I'll reach out to Dr. Mitzie Anda.     Pete Brand Bhakti Labella,MD

## 2023-06-07 ENCOUNTER — Ambulatory Visit (INDEPENDENT_AMBULATORY_CARE_PROVIDER_SITE_OTHER)

## 2023-06-07 VITALS — BP 105/71 | HR 64 | Temp 97.7°F | Resp 16 | Ht 66.0 in | Wt 169.2 lb

## 2023-06-07 DIAGNOSIS — C61 Malignant neoplasm of prostate: Secondary | ICD-10-CM

## 2023-06-07 DIAGNOSIS — D500 Iron deficiency anemia secondary to blood loss (chronic): Secondary | ICD-10-CM

## 2023-06-07 DIAGNOSIS — D509 Iron deficiency anemia, unspecified: Secondary | ICD-10-CM | POA: Diagnosis not present

## 2023-06-07 DIAGNOSIS — D649 Anemia, unspecified: Secondary | ICD-10-CM

## 2023-06-07 MED ORDER — ACETAMINOPHEN 325 MG PO TABS
650.0000 mg | ORAL_TABLET | Freq: Once | ORAL | Status: AC
  Administered 2023-06-07: 650 mg via ORAL
  Filled 2023-06-07: qty 2

## 2023-06-07 MED ORDER — HEPARIN SOD (PORK) LOCK FLUSH 100 UNIT/ML IV SOLN
500.0000 [IU] | Freq: Once | INTRAVENOUS | Status: AC | PRN
  Administered 2023-06-07: 500 [IU]
  Filled 2023-06-07: qty 5

## 2023-06-07 MED ORDER — DIPHENHYDRAMINE HCL 25 MG PO CAPS
25.0000 mg | ORAL_CAPSULE | Freq: Once | ORAL | Status: AC
  Administered 2023-06-07: 25 mg via ORAL
  Filled 2023-06-07: qty 1

## 2023-06-07 MED ORDER — IRON SUCROSE 20 MG/ML IV SOLN
200.0000 mg | Freq: Once | INTRAVENOUS | Status: AC
Start: 2023-06-07 — End: 2023-06-07
  Administered 2023-06-07: 200 mg via INTRAVENOUS
  Filled 2023-06-07: qty 10

## 2023-06-07 NOTE — Progress Notes (Signed)
 Diagnosis: Iron  Deficiency Anemia  Provider:  Praveen Mannam MD  Procedure: IV Push  IV Type: Port a Cath, IV Location: R Chest  Venofer  (Iron  Sucrose), Dose: 200 mg  Post Infusion IV Care: Port a Cath Deaccessed/Flushed. Heparin  flushed/locked   Discharge: Condition: Good, Destination: Home . AVS Provided  Performed by:  Natividad Balding, RN

## 2023-06-10 ENCOUNTER — Other Ambulatory Visit (HOSPITAL_COMMUNITY): Payer: Self-pay

## 2023-06-11 ENCOUNTER — Other Ambulatory Visit (HOSPITAL_COMMUNITY): Payer: Self-pay

## 2023-06-19 ENCOUNTER — Other Ambulatory Visit (HOSPITAL_COMMUNITY): Payer: Self-pay

## 2023-06-20 DIAGNOSIS — I509 Heart failure, unspecified: Secondary | ICD-10-CM | POA: Diagnosis not present

## 2023-06-20 DIAGNOSIS — R634 Abnormal weight loss: Secondary | ICD-10-CM | POA: Diagnosis not present

## 2023-06-20 DIAGNOSIS — R110 Nausea: Secondary | ICD-10-CM | POA: Diagnosis not present

## 2023-06-24 ENCOUNTER — Other Ambulatory Visit (HOSPITAL_COMMUNITY): Payer: Self-pay

## 2023-07-02 ENCOUNTER — Inpatient Hospital Stay: Attending: Hematology

## 2023-07-02 DIAGNOSIS — Z85028 Personal history of other malignant neoplasm of stomach: Secondary | ICD-10-CM | POA: Insufficient documentation

## 2023-07-02 DIAGNOSIS — Z95828 Presence of other vascular implants and grafts: Secondary | ICD-10-CM

## 2023-07-02 DIAGNOSIS — D509 Iron deficiency anemia, unspecified: Secondary | ICD-10-CM | POA: Insufficient documentation

## 2023-07-02 DIAGNOSIS — C169 Malignant neoplasm of stomach, unspecified: Secondary | ICD-10-CM

## 2023-07-02 DIAGNOSIS — Z8546 Personal history of malignant neoplasm of prostate: Secondary | ICD-10-CM | POA: Insufficient documentation

## 2023-07-02 DIAGNOSIS — C61 Malignant neoplasm of prostate: Secondary | ICD-10-CM

## 2023-07-02 LAB — CBC WITH DIFFERENTIAL (CANCER CENTER ONLY)
Abs Immature Granulocytes: 0.03 10*3/uL (ref 0.00–0.07)
Basophils Absolute: 0 10*3/uL (ref 0.0–0.1)
Basophils Relative: 0 %
Eosinophils Absolute: 0.2 10*3/uL (ref 0.0–0.5)
Eosinophils Relative: 3 %
HCT: 34.2 % — ABNORMAL LOW (ref 39.0–52.0)
Hemoglobin: 11.2 g/dL — ABNORMAL LOW (ref 13.0–17.0)
Immature Granulocytes: 0 %
Lymphocytes Relative: 12 %
Lymphs Abs: 1 10*3/uL (ref 0.7–4.0)
MCH: 30.9 pg (ref 26.0–34.0)
MCHC: 32.7 g/dL (ref 30.0–36.0)
MCV: 94.5 fL (ref 80.0–100.0)
Monocytes Absolute: 0.5 10*3/uL (ref 0.1–1.0)
Monocytes Relative: 6 %
Neutro Abs: 6.6 10*3/uL (ref 1.7–7.7)
Neutrophils Relative %: 79 %
Platelet Count: 257 10*3/uL (ref 150–400)
RBC: 3.62 MIL/uL — ABNORMAL LOW (ref 4.22–5.81)
RDW: 15.8 % — ABNORMAL HIGH (ref 11.5–15.5)
WBC Count: 8.3 10*3/uL (ref 4.0–10.5)
nRBC: 0 % (ref 0.0–0.2)

## 2023-07-02 LAB — CMP (CANCER CENTER ONLY)
ALT: 12 U/L (ref 0–44)
AST: 14 U/L — ABNORMAL LOW (ref 15–41)
Albumin: 4.1 g/dL (ref 3.5–5.0)
Alkaline Phosphatase: 50 U/L (ref 38–126)
Anion gap: 8 (ref 5–15)
BUN: 19 mg/dL (ref 8–23)
CO2: 25 mmol/L (ref 22–32)
Calcium: 9.2 mg/dL (ref 8.9–10.3)
Chloride: 104 mmol/L (ref 98–111)
Creatinine: 1.09 mg/dL (ref 0.61–1.24)
GFR, Estimated: >60 mL/min (ref >60)
Glucose, Bld: 100 mg/dL — ABNORMAL HIGH (ref 70–99)
Potassium: 4 mmol/L (ref 3.5–5.1)
Sodium: 137 mmol/L (ref 135–145)
Total Bilirubin: 0.4 mg/dL (ref 0.0–1.2)
Total Protein: 7.1 g/dL (ref 6.5–8.1)

## 2023-07-02 LAB — FERRITIN: Ferritin: 104 ng/mL (ref 24–336)

## 2023-07-02 MED ORDER — HEPARIN SOD (PORK) LOCK FLUSH 100 UNIT/ML IV SOLN
500.0000 [IU] | Freq: Once | INTRAVENOUS | Status: AC
  Administered 2023-07-02: 500 [IU]

## 2023-07-02 MED ORDER — SODIUM CHLORIDE 0.9% FLUSH
10.0000 mL | Freq: Once | INTRAVENOUS | Status: AC
  Administered 2023-07-02: 10 mL

## 2023-07-05 ENCOUNTER — Other Ambulatory Visit (HOSPITAL_BASED_OUTPATIENT_CLINIC_OR_DEPARTMENT_OTHER): Payer: Self-pay

## 2023-07-05 ENCOUNTER — Other Ambulatory Visit (HOSPITAL_COMMUNITY): Payer: Self-pay

## 2023-07-05 DIAGNOSIS — I428 Other cardiomyopathies: Secondary | ICD-10-CM | POA: Diagnosis not present

## 2023-07-05 DIAGNOSIS — K219 Gastro-esophageal reflux disease without esophagitis: Secondary | ICD-10-CM | POA: Diagnosis not present

## 2023-07-05 DIAGNOSIS — C169 Malignant neoplasm of stomach, unspecified: Secondary | ICD-10-CM | POA: Diagnosis not present

## 2023-07-05 MED ORDER — FAMOTIDINE 20 MG PO TABS
20.0000 mg | ORAL_TABLET | Freq: Every evening | ORAL | 3 refills | Status: DC | PRN
  Filled 2023-07-05: qty 30, 30d supply, fill #0
  Filled 2023-07-29: qty 30, 30d supply, fill #1

## 2023-07-05 MED ORDER — SUCRALFATE 1 G PO TABS
1.0000 g | ORAL_TABLET | Freq: Every day | ORAL | 3 refills | Status: DC
  Filled 2023-07-05: qty 30, 30d supply, fill #0
  Filled 2023-08-08: qty 30, 30d supply, fill #1

## 2023-07-08 ENCOUNTER — Encounter: Payer: Self-pay | Admitting: Hematology

## 2023-07-09 DIAGNOSIS — E1169 Type 2 diabetes mellitus with other specified complication: Secondary | ICD-10-CM | POA: Diagnosis not present

## 2023-07-09 DIAGNOSIS — I4891 Unspecified atrial fibrillation: Secondary | ICD-10-CM | POA: Diagnosis not present

## 2023-07-09 DIAGNOSIS — I739 Peripheral vascular disease, unspecified: Secondary | ICD-10-CM | POA: Diagnosis not present

## 2023-07-09 DIAGNOSIS — I5022 Chronic systolic (congestive) heart failure: Secondary | ICD-10-CM | POA: Diagnosis not present

## 2023-07-10 ENCOUNTER — Ambulatory Visit (INDEPENDENT_AMBULATORY_CARE_PROVIDER_SITE_OTHER): Payer: Medicare HMO

## 2023-07-10 DIAGNOSIS — I428 Other cardiomyopathies: Secondary | ICD-10-CM | POA: Diagnosis not present

## 2023-07-11 ENCOUNTER — Other Ambulatory Visit (HOSPITAL_COMMUNITY): Payer: Self-pay

## 2023-07-11 LAB — CUP PACEART REMOTE DEVICE CHECK
Battery Remaining Longevity: 25 mo
Battery Remaining Percentage: 24 %
Battery Voltage: 2.8 V
Brady Statistic RV Percent Paced: 66 %
Date Time Interrogation Session: 20250528020017
HighPow Impedance: 60 Ohm
HighPow Impedance: 60 Ohm
Implantable Lead Connection Status: 753985
Implantable Lead Connection Status: 753985
Implantable Lead Implant Date: 20180413
Implantable Lead Implant Date: 20200828
Implantable Lead Location: 753859
Implantable Lead Location: 753860
Implantable Lead Model: 7122
Implantable Pulse Generator Implant Date: 20180413
Lead Channel Impedance Value: 380 Ohm
Lead Channel Impedance Value: 750 Ohm
Lead Channel Pacing Threshold Amplitude: 1.25 V
Lead Channel Pacing Threshold Pulse Width: 0.5 ms
Lead Channel Sensing Intrinsic Amplitude: 0.4 mV
Lead Channel Sensing Intrinsic Amplitude: 11.7 mV
Lead Channel Setting Pacing Amplitude: 2.5 V
Lead Channel Setting Pacing Pulse Width: 0.5 ms
Lead Channel Setting Sensing Sensitivity: 0.5 mV
Pulse Gen Serial Number: 7398151
Zone Setting Status: 755011

## 2023-07-13 DIAGNOSIS — I4891 Unspecified atrial fibrillation: Secondary | ICD-10-CM | POA: Diagnosis not present

## 2023-07-13 DIAGNOSIS — I5022 Chronic systolic (congestive) heart failure: Secondary | ICD-10-CM | POA: Diagnosis not present

## 2023-07-13 DIAGNOSIS — E1169 Type 2 diabetes mellitus with other specified complication: Secondary | ICD-10-CM | POA: Diagnosis not present

## 2023-07-13 DIAGNOSIS — E0851 Diabetes mellitus due to underlying condition with diabetic peripheral angiopathy without gangrene: Secondary | ICD-10-CM | POA: Diagnosis not present

## 2023-07-13 DIAGNOSIS — C61 Malignant neoplasm of prostate: Secondary | ICD-10-CM | POA: Diagnosis not present

## 2023-07-13 DIAGNOSIS — I428 Other cardiomyopathies: Secondary | ICD-10-CM | POA: Diagnosis not present

## 2023-07-13 DIAGNOSIS — I739 Peripheral vascular disease, unspecified: Secondary | ICD-10-CM | POA: Diagnosis not present

## 2023-07-15 DIAGNOSIS — R222 Localized swelling, mass and lump, trunk: Secondary | ICD-10-CM | POA: Diagnosis not present

## 2023-07-16 ENCOUNTER — Encounter: Payer: Self-pay | Admitting: Podiatry

## 2023-07-16 ENCOUNTER — Ambulatory Visit: Admitting: Podiatry

## 2023-07-16 DIAGNOSIS — B351 Tinea unguium: Secondary | ICD-10-CM

## 2023-07-16 DIAGNOSIS — E119 Type 2 diabetes mellitus without complications: Secondary | ICD-10-CM | POA: Diagnosis not present

## 2023-07-16 DIAGNOSIS — M79672 Pain in left foot: Secondary | ICD-10-CM | POA: Diagnosis not present

## 2023-07-16 DIAGNOSIS — L84 Corns and callosities: Secondary | ICD-10-CM | POA: Diagnosis not present

## 2023-07-16 DIAGNOSIS — M79671 Pain in right foot: Secondary | ICD-10-CM

## 2023-07-16 NOTE — Progress Notes (Signed)
 Patient presents for evaluation and treatment of tenderness and some redness around nails feet.  Tenderness around toes with walking and wearing shoes.  Also complains of painful callus on the medial aspect of the foot over bunion deformity on the right foot bothers him with walking and wearing shoes  Physical exam:  General appearance: Alert, pleasant, and in no acute distress.  Vascular: Pedal pulses: DP 2/4, PT 0/4.  Mild edema lower legs bilaterally  Neurological:  No paresthesias or burning noted  Dermatologic:  Nails thickened, disfigured, discolored 1-5 BL with subungual debris.  Redness and hypertrophic nail folds along nail folds bilaterally but no signs of drainage or infection.  Painful callus medial aspect first metatarsal phalangeal joint right foot  Musculoskeletal:  Hammertoes 2 through 5 bilaterally.  Severe HAV deformities bilaterally.   Diagnosis: 1. Painful onychomycotic nails 1 through 5 bilaterally. 2. Pain toes 1 through 5 bilaterally. 3. Callous foot RT 4. Diabetes mellitus type 2 without complications  Plan: -Debrided onychomycotic nails 1 through 5 bilaterally. -Debrided 1 painful hyperkeratotic lesion preulcerative over first metatarsal phalangeal joint right foot  Return 3 months

## 2023-07-16 NOTE — H&P (View-Only) (Signed)
 Patient presents for evaluation and treatment of tenderness and some redness around nails feet.  Tenderness around toes with walking and wearing shoes.  Also complains of painful callus on the medial aspect of the foot over bunion deformity on the right foot bothers him with walking and wearing shoes  Physical exam:  General appearance: Alert, pleasant, and in no acute distress.  Vascular: Pedal pulses: DP 2/4, PT 0/4.  Mild edema lower legs bilaterally  Neurological:  No paresthesias or burning noted  Dermatologic:  Nails thickened, disfigured, discolored 1-5 BL with subungual debris.  Redness and hypertrophic nail folds along nail folds bilaterally but no signs of drainage or infection.  Painful callus medial aspect first metatarsal phalangeal joint right foot  Musculoskeletal:  Hammertoes 2 through 5 bilaterally.  Severe HAV deformities bilaterally.   Diagnosis: 1. Painful onychomycotic nails 1 through 5 bilaterally. 2. Pain toes 1 through 5 bilaterally. 3. Callous foot RT 4. Diabetes mellitus type 2 without complications  Plan: -Debrided onychomycotic nails 1 through 5 bilaterally. -Debrided 1 painful hyperkeratotic lesion preulcerative over first metatarsal phalangeal joint right foot  Return 3 months

## 2023-07-18 ENCOUNTER — Ambulatory Visit: Payer: Self-pay | Admitting: Internal Medicine

## 2023-07-18 ENCOUNTER — Other Ambulatory Visit (HOSPITAL_COMMUNITY): Payer: Self-pay | Admitting: Physician Assistant

## 2023-07-18 ENCOUNTER — Telehealth (HOSPITAL_COMMUNITY): Payer: Self-pay | Admitting: Cardiology

## 2023-07-18 DIAGNOSIS — M545 Low back pain, unspecified: Secondary | ICD-10-CM

## 2023-07-18 NOTE — Telephone Encounter (Signed)
 Wife called to report neck swelling "thick around the neck" reports she remembers at last OV neck vein was evaluated and found to have fluid reports similar event today  Currently scheduled for 6/11 however would like to know if he should increase med  Weight stable 162-164 Denies BLE,abdominal distension No SOB No CP Reports medication compliance   Please advise

## 2023-07-18 NOTE — Telephone Encounter (Signed)
 Pt aware and voiced understanding

## 2023-07-22 ENCOUNTER — Other Ambulatory Visit: Payer: Self-pay | Admitting: Physician Assistant

## 2023-07-22 DIAGNOSIS — R229 Localized swelling, mass and lump, unspecified: Secondary | ICD-10-CM

## 2023-07-23 ENCOUNTER — Telehealth (HOSPITAL_COMMUNITY): Payer: Self-pay

## 2023-07-23 NOTE — Telephone Encounter (Signed)
 Called to confirm/remind patient of their appointment at the [facility] on 07/24/2023 10:30.   Appointment:   [x] Confirmed  [] Left mess   [] No answer/No voice mail  [] VM Full/unable to leave message  [] Phone not in service  Patient reminded to bring all medications and/or complete list.  Confirmed patient has transportation. Gave directions, instructed to utilize valet parking.

## 2023-07-24 ENCOUNTER — Ambulatory Visit (HOSPITAL_COMMUNITY)
Admission: RE | Admit: 2023-07-24 | Discharge: 2023-07-24 | Disposition: A | Discharge status: Home / Self Care | Source: Ambulatory Visit | Attending: Physician Assistant | Admitting: Physician Assistant

## 2023-07-24 ENCOUNTER — Other Ambulatory Visit (HOSPITAL_COMMUNITY): Payer: Self-pay

## 2023-07-24 ENCOUNTER — Encounter (HOSPITAL_COMMUNITY): Payer: Self-pay

## 2023-07-24 VITALS — BP 94/60 | HR 85 | Ht 66.0 in | Wt 164.2 lb

## 2023-07-24 DIAGNOSIS — G4733 Obstructive sleep apnea (adult) (pediatric): Secondary | ICD-10-CM | POA: Diagnosis not present

## 2023-07-24 DIAGNOSIS — R109 Unspecified abdominal pain: Secondary | ICD-10-CM | POA: Insufficient documentation

## 2023-07-24 DIAGNOSIS — I2510 Atherosclerotic heart disease of native coronary artery without angina pectoris: Secondary | ICD-10-CM | POA: Insufficient documentation

## 2023-07-24 DIAGNOSIS — E119 Type 2 diabetes mellitus without complications: Secondary | ICD-10-CM | POA: Insufficient documentation

## 2023-07-24 DIAGNOSIS — I110 Hypertensive heart disease with heart failure: Secondary | ICD-10-CM | POA: Insufficient documentation

## 2023-07-24 DIAGNOSIS — Z7984 Long term (current) use of oral hypoglycemic drugs: Secondary | ICD-10-CM | POA: Diagnosis not present

## 2023-07-24 DIAGNOSIS — Z7901 Long term (current) use of anticoagulants: Secondary | ICD-10-CM | POA: Diagnosis not present

## 2023-07-24 DIAGNOSIS — Z79899 Other long term (current) drug therapy: Secondary | ICD-10-CM | POA: Diagnosis not present

## 2023-07-24 DIAGNOSIS — I4821 Permanent atrial fibrillation: Secondary | ICD-10-CM | POA: Insufficient documentation

## 2023-07-24 DIAGNOSIS — Z8546 Personal history of malignant neoplasm of prostate: Secondary | ICD-10-CM | POA: Diagnosis not present

## 2023-07-24 DIAGNOSIS — I447 Left bundle-branch block, unspecified: Secondary | ICD-10-CM | POA: Insufficient documentation

## 2023-07-24 DIAGNOSIS — I5022 Chronic systolic (congestive) heart failure: Secondary | ICD-10-CM

## 2023-07-24 DIAGNOSIS — Z8500 Personal history of malignant neoplasm of unspecified digestive organ: Secondary | ICD-10-CM | POA: Diagnosis not present

## 2023-07-24 DIAGNOSIS — I428 Other cardiomyopathies: Secondary | ICD-10-CM | POA: Diagnosis not present

## 2023-07-24 NOTE — Patient Instructions (Addendum)
 There has been no changes to your medications.   You are scheduled for a Cardiac Catheterization on Thursday, June 12 with Dr. Peder Bourdon.  1. Please arrive at the [facility] (Main Entrance A) at [facility]: [address] [place], [place] [address] at 5:30 AM (This time is 2 hour(s) before your procedure to ensure your preparation).   Free valet parking service is available. You will check in at ADMITTING. The support person will be asked to wait in the waiting room.  It is OK to have someone drop you off and come back when you are ready to be discharged.    Special note: Every effort is made to have your procedure done on time. Please understand that emergencies sometimes delay scheduled procedures.  2. Diet: Do not eat solid foods after midnight.  The patient may have clear liquids until 5am upon the day of the procedure.  3. Medication instructions in preparation for your procedure:   Contrast Allergy: No  HOLD YOUR JARDIANCE , LASIX , XARELTO  AND METFORMIN  TOMORROW MORNING  On the morning of your procedure, take any morning medicines NOT listed above.  You may use sips of water.  5. Plan to go home the same day, you will only stay overnight if medically necessary. 6. Bring a current list of your medications and current insurance cards. 7. You MUST have a responsible person to drive you home. 8. Someone MUST be with you the first 24 hours after you arrive home or your discharge will be delayed. 9. Please wear clothes that are easy to get on and off and wear slip-on shoes.   Your physician recommends that you schedule a follow-up appointment in: as scheduled.  If you have any questions or concerns before your next appointment please send us  a message through [place] or call our office at [phone].    TO LEAVE A MESSAGE FOR THE NURSE SELECT OPTION 2, PLEASE LEAVE A MESSAGE INCLUDING: YOUR NAME DATE OF BIRTH CALL BACK NUMBER REASON FOR CALL**this is important as we  prioritize the call backs  YOU WILL RECEIVE A CALL BACK THE SAME DAY AS LONG AS YOU CALL BEFORE 4:00 PM  At the [facility], you and your health needs are our priority. As part of our continuing mission to provide you with exceptional heart care, we have created designated Provider Care Teams. These Care Teams include your primary Cardiologist (physician) and Advanced Practice Providers (APPs- Physician Assistants and Nurse Practitioners) who all work together to provide you with the care you need, when you need it.   You may see any of the following providers on your designated Care Team at your next follow up: Dr Jules Oar Dr Peder Bourdon Dr. Alwin Baars Dr. Arta Lark Amy Marijane Shoulders, NP Ruddy Corral, [place] [facility], [place] Dennise Fitz, NP [place] Lee, NP Shawnee Dellen, NP Luster Salters, PharmD Bevely Brush, PharmD   Please be sure to bring in all your medications bottles to every appointment.    Thank you for choosing [facility]

## 2023-07-24 NOTE — Progress Notes (Signed)
 ReDS Vest / Clip - 07/24/23 1020       ReDS Vest / Clip   Station Marker D    Ruler Value 31    ReDS Value Range Low volume    ReDS Actual Value 30

## 2023-07-24 NOTE — Progress Notes (Addendum)
 ADVANCED HF CLINIC NOTE   Primary Care: Jon Star, MD Primary Cardiologist: Jon Alert, MD HF Cardiologist: Dr. Mitzie Williams  Chief complaint: CHF  HPI: [name] is a 73 y.o. with a history of chronic systolic heart failure, St Jude ICD, permanent atrial fibrillation, CAD, DMII, LBBB, HTN, gastric cancer 2022, and prostate cancer 2019.    Cath 2018 showed mild to moderate non-obstructive CAD. Fick CI 1.9.    Echo 10/22 showed EF 20-25%, mod-severe MR, mod TR, RV nl   Echo 2/23 showed EF 20-25%, mild MR, RV not well visualized   Admitted 8/23 with HF. Diuresed with IV lasix  and GDMT adjusted. Echo showed EF 20-25%, RV mildly reduced, LA severely dilated, moderate-severe MR.    Had follow up with Dr Jon Williams 09/2022. CT scan no evidence of recurrent cancer. Plan for repeat CT in 8 months.    Admitted 9/24 with CHF. Echo showed EF 10-15%, G3DD, RV mildly reduced. R/LHC showed nonobstructive CAD, normal filling pressures, and low cardiac index. Unable to get cMRI as device not compatible. GDMT titrated and he was discharged home, weight 183 lbs.  Echo 12/24 EF 20% with severe LV dilation, normal RV, severe LAE, moderate MR, IVC normal.   He was scheduled for RHC but decided to cancel.   Here today for 3 month CHF follow-up. Accompanied by his wife who assists with the history. Notes more shortness of breath with exertion. His wife reports he would not be able to walk more than a block without needing a break. No orthopnea or PND. Denies lower extremity edema. Has been trying to maintain his weight, down 20 lb over the last year. Gets bloated and full easily. Also notes burning abdominal pain intermittently. Has tried carafate  and pepcid , notes minimal relief. Taking all medications as prescribed.     PMH: 1. H/o gastric cancer.  2. H/o prostate cancer.  3. Type 2 DM 4. Atrial fibrillation: Permanent 5. Chronic systolic CHF: Nonischemic cardiomyopathy. St Jude CRT-D.  - R/LHC  (2018): Mild to moderate non obstructive CAD; RA 8, PA 42/20 (29), PCWP 17, CO/CI (Fick) 3.76/ 1.9  - Echo (10/22): EF 20-25%, mod-severe MR, mod TR, RV normal - Echo (2/23): EF 20-25%, mild MR, RV not well visualized - Echo (8/24): EF 20-25%, RV mildly reduced, LA severely dilated, moderate to severe MR - R/LHC (9/24): non obstructive CAD; RA 1, PA 28/10 (17), PCWP 8, Fick CI 2.16, thermo CI 1.84.  - Echo (9/24): EF 10-15%, severe LV dilation, mild RV dysfunction with moderate RV enlargement, moderate-severe MR.  - Echo (12/24): EF 20% with severe LV dilation, normal RV, severe LAE, moderate MR, IVC normal.  6. Fe deficiency anemia  FH: mother CVA, MI  Review of Systems: All systems reviewed and negative except as per HPI.   Current Outpatient Medications  Medication Sig Dispense Refill   Accu-Chek Softclix Lancets lancets Use to check your blood sugar finger stick once a day Dx Code E11.9 90 days 100 each 3   atorvastatin  (LIPITOR) 40 MG tablet Take 1 tablet (40 mg total) by mouth daily. 90 tablet 3   Biotin 1000 MCG tablet Take 1,000 mcg by mouth daily with breakfast.     Blood Glucose Monitoring Suppl (ACCU-CHEK GUIDE) w/Device KIT as directed to check blood sugar once a day 1 kit 0   cholecalciferol  (VITAMIN D3) 25 MCG (1000 UT) tablet Take 1,000 Units by mouth daily with breakfast.     digoxin  (LANOXIN ) 0.125 MG tablet Take 1/2  tablet (0.0625mg  total) by mouth daily. 15 tablet 6   empagliflozin  (JARDIANCE ) 10 MG TABS tablet Take 1 tablet (10 mg total) by mouth daily. 90 tablet 3   famotidine  (PEPCID ) 20 MG tablet Take 1 tablet (20 mg total) by mouth once a day at bedtime as needed. 30 tablet 3   furosemide  (LASIX ) 40 MG tablet Take 1 tablet (40 mg total) by mouth daily. 30 tablet 6   gabapentin  (NEURONTIN ) 100 MG capsule Take 1 capsule (100 mg total) by mouth 2 (two) times daily. 180 capsule 1   glucose blood (ACCU-CHEK GUIDE TEST) test strip Use to check blood sugars once daily. 100  each 3   ketoconazole  (NIZORAL ) 2 % cream Apply 1 Application topically 2 (two) times daily. (Patient taking differently: Apply 1 Application topically 2 (two) times daily. As needed) 30 g 3   lidocaine -prilocaine  (EMLA ) cream Apply 1 Application topically as needed. 30 g 0   loratadine  (CLARITIN ) 10 MG tablet Take 10 mg by mouth daily.     metFORMIN  (GLUCOPHAGE -XR) 500 MG 24 hr tablet Take 1 tablet (500 mg total) by mouth with evening meal 90 tablet 3   metoprolol  succinate (TOPROL -XL) 25 MG 24 hr tablet Take 1 tablet (25 mg total) by mouth daily. 90 tablet 3   multivitamin (ONE-A-DAY MEN'S) TABS tablet Take 1 tablet by mouth daily with breakfast.     Naftifine HCl (NAFTIN) 2 % GEL Apply 1 application  topically every Monday, Wednesday, and Friday.     pantoprazole  (PROTONIX ) 40 MG tablet Take 1 tablet (40 mg total) by mouth 2 (two) times daily. 180 tablet 3   potassium chloride  (KLOR-CON ) 10 MEQ tablet Take 2 tablets (20 mEq total) by mouth daily. 60 tablet 3   rivaroxaban  (XARELTO ) 20 MG TABS tablet Take 1 tablet (20 mg total) by mouth daily with supper. 90 tablet 3   sacubitril -valsartan  (ENTRESTO ) 24-26 MG Take 1 tablet by mouth 2 (two) times daily. 180 tablet 1   spironolactone  (ALDACTONE ) 25 MG tablet Take 1 tablet (25 mg total) by mouth daily. 90 tablet 3   sucralfate  (CARAFATE ) 1 g tablet Take 1 tablet (1 g total) by mouth daily on an empty stomach. 30 tablet 3   Tavaborole 5 % SOLN Apply 1 application  topically daily. Nail on left hand     No current facility-administered medications for this encounter.   Allergies  Allergen Reactions   Aspirin  Anaphylaxis and Hives   Sulfa Antibiotics Anaphylaxis, Hives, Swelling and Other (See Comments)    Swollen lips   Social History   Socioeconomic History   Marital status: Married    Spouse name: Not on file   Number of children: 2   Years of education: Not on file   Highest education level: Not on file  Occupational History    Occupation: retired  Tobacco Use   Smoking status: Never   Smokeless tobacco: Never  Vaping Use   Vaping status: Never Used  Substance and Sexual Activity   Alcohol use: No   Drug use: No   Sexual activity: Not Currently  Other Topics Concern   Not on file  Social History Narrative   Retired Location manager. Married to Mrs. Jerie [place]. Daughter, Sharri Dee, lives in [place] . Son, Euel Herring, lives in [place] .   Social Drivers of Corporate investment banker Strain: Low Risk  (12/26/2021)   Overall Financial Resource Strain (CARDIA)    Difficulty of Paying Living Expenses: Not very hard  Food Insecurity:  No Food Insecurity (10/29/2022)   Hunger Vital Sign    Worried About Running Out of Food in the Last Year: Never true    Ran Out of Food in the Last Year: Never true  Transportation Needs: No Transportation Needs (10/29/2022)   PRAPARE - Administrator, Civil Service (Medical): No    Lack of Transportation (Non-Medical): No  Physical Activity: Insufficiently Active (09/26/2021)   Exercise Vital Sign    Days of Exercise per Week: 7 days    Minutes of Exercise per Session: 10 min  Stress: No Stress Concern Present (09/26/2021)   [employer] of Occupational Health - Occupational Stress Questionnaire    Feeling of Stress : Not at all  Social Connections: Not on file  Intimate Partner Violence: Not At Risk (10/29/2022)   Humiliation, Afraid, Rape, and Kick questionnaire    Fear of Current or Ex-Partner: No    Emotionally Abused: No    Physically Abused: No    Sexually Abused: No   BP 94/60   Pulse 85   Ht 5' 6 (1.676 m)   Wt 74.5 kg (164 lb 3.2 oz)   SpO2 93%   BMI 26.50 kg/m   Wt Readings from Last 3 Encounters:  07/24/23 74.5 kg (164 lb 3.2 oz)  06/07/23 76.7 kg (169 lb 3.2 oz)  06/05/23 76 kg (167 lb 9.6 oz)   PHYSICAL EXAM: General:  Thin elderly male Neck: IJ and EJ appear distended Cor: Regular rate & rhythm. No rubs, gallops or murmurs. Lungs:  clear Abdomen: soft, nontender, nondistended.  Extremities: no edema Neuro: Williams & orientedx3Affect pleasant   St Jude device interrogation (personally reviewed): 65% RV paced, >99% AT/AF, thoracic impedance above threshold  ReDS 30%  ASSESSMENT & PLAN: 1. Chronic systolic CHF: Long-standing cardiomyopathy, nonischemic.  St Jude CRT-D device. Device is not MRI compatible. Cath in 2018 with nonobstructive CAD but CI at that time was low at 1.9.  Echo in 8/23 showed EF 20-25%, mild RV dysfunction, moderate-severe MR.  Admitted 9/24 with CHF. Echo showed EF 10-15%, G3DD, RV mildly reduced. R/LHC showed non-obstructive CAD, normal filling pressures and low CI (2.16 Fick/1.84 thermo). Echo 12/24 showed EF 20% with severe LV dilation, normal RV, severe LAE, moderate MR, IVC normal. - Based on low output on prior RHC and markedly low EF, would consider him for LVAD.  Previously have recommended RHC to see if hemodynamics are improved. This was set up but he later cancelled the procedure. Not pursued further as he was having minimal symptoms.  I would also like to get a CPX for objective measurement of his functional capacity but unable to get this currently as we do not have an exercise tech.  - Patient appears to be RV pacing 65% of the time with wide QRS.  Device interrogation does not show BiV pacing. Had attempted revision in 2020 but no coronary veins for LV lead insertion. Saw Dr. Carolynne Citron in 04/23. Insertion of left bundle area lead felt to be likely of limited benefit.  - NYHA III, worse. IJ and EJ appear distended but volume otherwise okay on exam and by ReDS which is 30%. CorVue thoracic impedance also stable. I am concerned about his worsening dyspnea, decreased appetite and abdominal symptoms. It is possible some of his symptoms may be d/t GI issue but has not noticed much relief with pepcid  and carafate . Recommend RHC to definitively assess volume and hemodynamics. Discussed with Dr. Mitzie Williams. Will  arrange for tomorrow AM.  He states he would not want to consider LVAD at this point. - Continue Lasix  40 mg daily. Recent CMET stable - Continue Entresto  24/26 mg bid, no BP room to increase.  - Continue spironolactone  25 mg daily. - Continue Jardiance  10 mg daily - Continue Toprol  XL 25 mg daily. - continue digoxin  2. Atrial fibrillation: This is permanent.   - Continue Xarelto .   3. Gastric cancer: Treated, now being monitored.  - No evidence of metastatic disease on CT C/A/P in 03/25 4. Type 2 diabetes: Per PCP.  - Continue Jardiance . 5. OSA: Suspected. He declines sleep study.  Followup 3-4 weeks with Dr. Mitzie Williams    [facility], Heidi Llamas N  07/24/23

## 2023-07-25 ENCOUNTER — Ambulatory Visit (HOSPITAL_COMMUNITY)
Admission: RE | Admit: 2023-07-25 | Discharge: 2023-07-25 | Disposition: A | Discharge status: Home / Self Care | Attending: Cardiology | Admitting: Cardiology

## 2023-07-25 ENCOUNTER — Telehealth: Payer: Self-pay | Admitting: Internal Medicine

## 2023-07-25 ENCOUNTER — Encounter (HOSPITAL_COMMUNITY): Payer: Self-pay | Admitting: Cardiology

## 2023-07-25 ENCOUNTER — Inpatient Hospital Stay (HOSPITAL_COMMUNITY)
Admit: 2023-07-25 | Discharge: 2023-07-25 | Disposition: A | Discharge status: Home / Self Care | Attending: Cardiology | Admitting: Cardiology

## 2023-07-25 ENCOUNTER — Other Ambulatory Visit: Payer: Self-pay

## 2023-07-25 ENCOUNTER — Other Ambulatory Visit (HOSPITAL_COMMUNITY): Payer: Self-pay | Admitting: Cardiology

## 2023-07-25 ENCOUNTER — Encounter (HOSPITAL_COMMUNITY)
Admission: RE | Disposition: A | Discharge status: Home / Self Care | Payer: Self-pay | Source: Home / Self Care | Attending: Cardiology

## 2023-07-25 DIAGNOSIS — G4733 Obstructive sleep apnea (adult) (pediatric): Secondary | ICD-10-CM | POA: Insufficient documentation

## 2023-07-25 DIAGNOSIS — I5022 Chronic systolic (congestive) heart failure: Secondary | ICD-10-CM | POA: Insufficient documentation

## 2023-07-25 DIAGNOSIS — I428 Other cardiomyopathies: Secondary | ICD-10-CM | POA: Insufficient documentation

## 2023-07-25 DIAGNOSIS — I493 Ventricular premature depolarization: Secondary | ICD-10-CM

## 2023-07-25 DIAGNOSIS — Z8546 Personal history of malignant neoplasm of prostate: Secondary | ICD-10-CM | POA: Insufficient documentation

## 2023-07-25 DIAGNOSIS — Z7984 Long term (current) use of oral hypoglycemic drugs: Secondary | ICD-10-CM | POA: Insufficient documentation

## 2023-07-25 DIAGNOSIS — Z79899 Other long term (current) drug therapy: Secondary | ICD-10-CM | POA: Diagnosis not present

## 2023-07-25 DIAGNOSIS — Z8500 Personal history of malignant neoplasm of unspecified digestive organ: Secondary | ICD-10-CM | POA: Insufficient documentation

## 2023-07-25 DIAGNOSIS — Z95810 Presence of automatic (implantable) cardiac defibrillator: Secondary | ICD-10-CM | POA: Insufficient documentation

## 2023-07-25 DIAGNOSIS — I4821 Permanent atrial fibrillation: Secondary | ICD-10-CM | POA: Diagnosis not present

## 2023-07-25 DIAGNOSIS — I2510 Atherosclerotic heart disease of native coronary artery without angina pectoris: Secondary | ICD-10-CM | POA: Insufficient documentation

## 2023-07-25 DIAGNOSIS — I110 Hypertensive heart disease with heart failure: Secondary | ICD-10-CM | POA: Insufficient documentation

## 2023-07-25 DIAGNOSIS — E119 Type 2 diabetes mellitus without complications: Secondary | ICD-10-CM | POA: Insufficient documentation

## 2023-07-25 DIAGNOSIS — I447 Left bundle-branch block, unspecified: Secondary | ICD-10-CM | POA: Diagnosis not present

## 2023-07-25 DIAGNOSIS — I509 Heart failure, unspecified: Secondary | ICD-10-CM

## 2023-07-25 DIAGNOSIS — Z7901 Long term (current) use of anticoagulants: Secondary | ICD-10-CM | POA: Diagnosis not present

## 2023-07-25 HISTORY — PX: RIGHT HEART CATH: CATH118263

## 2023-07-25 LAB — POCT I-STAT EG7
Acid-base deficit: 3 mmol/L — ABNORMAL HIGH (ref 0.0–2.0)
Acid-base deficit: 3 mmol/L — ABNORMAL HIGH (ref 0.0–2.0)
Bicarbonate: 21.5 mmol/L (ref 20.0–28.0)
Bicarbonate: 21.6 mmol/L (ref 20.0–28.0)
Calcium, Ion: 1.15 mmol/L (ref 1.15–1.40)
Calcium, Ion: 1.18 mmol/L (ref 1.15–1.40)
HCT: 28 % — ABNORMAL LOW (ref 39.0–52.0)
HCT: 28 % — ABNORMAL LOW (ref 39.0–52.0)
Hemoglobin: 9.5 g/dL — ABNORMAL LOW (ref 13.0–17.0)
Hemoglobin: 9.5 g/dL — ABNORMAL LOW (ref 13.0–17.0)
O2 Saturation: 61 %
O2 Saturation: 61 %
Potassium: 4 mmol/L (ref 3.5–5.1)
Potassium: 4.1 mmol/L (ref 3.5–5.1)
Sodium: 136 mmol/L (ref 135–145)
Sodium: 136 mmol/L (ref 135–145)
TCO2: 23 mmol/L (ref 22–32)
TCO2: 23 mmol/L (ref 22–32)
pCO2, Ven: 34.7 mmHg — ABNORMAL LOW (ref 44–60)
pCO2, Ven: 35 mmHg — ABNORMAL LOW (ref 44–60)
pH, Ven: 7.397 (ref 7.25–7.43)
pH, Ven: 7.402 (ref 7.25–7.43)
pO2, Ven: 31 mmHg — CL (ref 32–45)
pO2, Ven: 31 mmHg — CL (ref 32–45)

## 2023-07-25 LAB — GLUCOSE, CAPILLARY
Glucose-Capillary: 105 mg/dL — ABNORMAL HIGH (ref 70–99)
Glucose-Capillary: 93 mg/dL (ref 70–99)

## 2023-07-25 SURGERY — RIGHT HEART CATH
Anesthesia: LOCAL

## 2023-07-25 MED ORDER — LABETALOL HCL 5 MG/ML IV SOLN: 10.0000 mg | INTRAVENOUS | Status: AC | PRN

## 2023-07-25 MED ORDER — SODIUM CHLORIDE 0.9 % IV SOLN: 250.0000 mL | INTRAVENOUS | Status: DC | PRN

## 2023-07-25 MED ORDER — HEPARIN (PORCINE) IN NACL 1000-0.9 UT/500ML-% IV SOLN
INTRAVENOUS | Status: DC | PRN
  Administered 2023-07-25: 500 mL

## 2023-07-25 MED ORDER — FENTANYL CITRATE (PF) 100 MCG/2ML IJ SOLN
INTRAMUSCULAR | Status: DC | PRN
  Administered 2023-07-25: 25 ug via INTRAVENOUS

## 2023-07-25 MED ORDER — LIDOCAINE HCL (PF) 1 % IJ SOLN
INTRAMUSCULAR | Status: DC | PRN
  Administered 2023-07-25: 15 mL
  Administered 2023-07-25: 2 mL

## 2023-07-25 MED ORDER — HYDRALAZINE HCL 20 MG/ML IJ SOLN: 10.0000 mg | INTRAMUSCULAR | Status: AC | PRN

## 2023-07-25 MED ORDER — SODIUM CHLORIDE 0.9 % IV SOLN: INTRAVENOUS | Status: DC

## 2023-07-25 MED ORDER — ONDANSETRON HCL 4 MG/2ML IJ SOLN
4.0000 mg | Freq: Four times a day (QID) | INTRAMUSCULAR | Status: DC | PRN
Start: 2023-07-25 — End: 2023-07-27

## 2023-07-25 MED ORDER — MIDAZOLAM HCL 2 MG/2ML IJ SOLN
INTRAMUSCULAR | Status: DC | PRN
  Administered 2023-07-25: 1 mg via INTRAVENOUS

## 2023-07-25 MED ORDER — ACETAMINOPHEN 325 MG PO TABS: 650.0000 mg | ORAL_TABLET | ORAL | Status: DC | PRN

## 2023-07-25 MED ORDER — FENTANYL CITRATE (PF) 100 MCG/2ML IJ SOLN
INTRAMUSCULAR | Status: AC
Start: 2023-07-25 — End: 2023-07-25
  Filled 2023-07-25: qty 2

## 2023-07-25 MED ORDER — SODIUM CHLORIDE 0.9% FLUSH: 3.0000 mL | INTRAVENOUS | Status: DC | PRN

## 2023-07-25 MED ORDER — MIDAZOLAM HCL 2 MG/2ML IJ SOLN
INTRAMUSCULAR | Status: AC
  Filled 2023-07-25: qty 2

## 2023-07-25 MED ORDER — SODIUM CHLORIDE 0.9% FLUSH
3.0000 mL | Freq: Two times a day (BID) | INTRAVENOUS | Status: DC
Start: 2023-07-25 — End: 2023-07-27

## 2023-07-25 MED ORDER — LIDOCAINE HCL (PF) 1 % IJ SOLN
INTRAMUSCULAR | Status: AC
  Filled 2023-07-25: qty 30

## 2023-07-25 SURGICAL SUPPLY — 11 items
CATH SWAN GANZ 7F STRAIGHT (CATHETERS) IMPLANT
GLIDESHEATH SLENDER 7FR .021G (SHEATH) IMPLANT
GUIDEWIRE .025 260CM (WIRE) IMPLANT
PACK CARDIAC CATHETERIZATION (CUSTOM PROCEDURE TRAY) ×1 IMPLANT
SHEATH PINNACLE 7F 10CM (SHEATH) IMPLANT
SHEATH PROBE COVER 6X72 (BAG) IMPLANT
TRANSDUCER W/STOPCOCK (MISCELLANEOUS) IMPLANT
TUBING ART PRESS 72 MALE/FEM (TUBING) IMPLANT
WIRE EMERALD 3MM-J .025X260CM (WIRE) IMPLANT
WIRE MICRO SET SILHO 5FR 7 (SHEATH) IMPLANT
WIRE MICROINTRODUCER 60CM (WIRE) IMPLANT

## 2023-07-25 NOTE — Progress Notes (Signed)
ZIO AT applied at hospital   Dr. Shirlee Latch to read.

## 2023-07-25 NOTE — Interval H&P Note (Signed)
 History and Physical Interval Note:  07/25/2023 7:52 AM  Jon Williams  has presented today for surgery, with the diagnosis of HF.  The various methods of treatment have been discussed with the patient and family. After consideration of risks, benefits and other options for treatment, the patient has consented to  Procedure(s): RIGHT HEART CATH (N/A) as a surgical intervention.  The patient's history has been reviewed, patient examined, no change in status, stable for surgery.  I have reviewed the patient's chart and labs.  Questions were answered to the patient's satisfaction.     [name]

## 2023-07-25 NOTE — Telephone Encounter (Signed)
 Calling with abn results

## 2023-07-25 NOTE — Discharge Instructions (Addendum)
 1. You will get a Zio monitor prior to discharge to assess for premature ventricular contractions.  2. You will get an appointment with me in 2 weeks in our LVAD clinic.    Femoral Site Care This sheet gives you information about how to care for yourself after your procedure. Your health care provider may also give you more specific instructions. If you have problems or questions, contact your health care provider. What can I expect after the procedure?  After the procedure, it is common to have: Bruising that usually fades within 1-2 weeks. Tenderness at the site. Follow these instructions at home: Wound care Follow instructions from your health care provider about how to take care of your insertion site. Make sure you: Wash your hands with soap and water before you change your bandage (dressing). If soap and water are not available, use hand sanitizer. Remove your dressing as told by your health care provider. 24 hours Do not take baths, swim, or use a hot tub until your health care provider approves. You may shower 24-48 hours after the procedure or as told by your health care provider. Gently wash the site with plain soap and water. Pat the area dry with a clean towel. Do not rub the site. This may cause bleeding. Do not apply powder or lotion to the site. Keep the site clean and dry. Check your femoral site every day for signs of infection. Check for: Redness, swelling, or pain. Fluid or blood. Warmth. Pus or a bad smell. Activity For the first 2-3 days after your procedure, or as long as directed: Avoid climbing stairs as much as possible. Do not squat. Do not lift anything that is heavier than 10 lb (4.5 kg), or the limit that you are told, until your health care provider says that it is safe. For 5 days Rest as directed. Avoid sitting for a long time without moving. Get up to take short walks every 1-2 hours. Do not drive for 24 hours if you were given a medicine to help you  relax (sedative). General instructions Take over-the-counter and prescription medicines only as told by your health care provider. Keep all follow-up visits as told by your health care provider. This is important. Contact a health care provider if you have: A fever or chills. You have redness, swelling, or pain around your insertion site. Get help right away if: The catheter insertion area swells very fast. You pass out. You suddenly start to sweat or your skin gets clammy. The catheter insertion area is bleeding, and the bleeding does not stop when you hold steady pressure on the area. The area near or just beyond the catheter insertion site becomes pale, cool, tingly, or numb. These symptoms may represent a serious problem that is an emergency. Do not wait to see if the symptoms will go away. Get medical help right away. Call your local emergency services (911 in the [place] Do not drive yourself to the hospital. Summary After the procedure, it is common to have bruising that usually fades within 1-2 weeks. Check your femoral site every day for signs of infection. Do not lift anything that is heavier than 10 lb (4.5 kg), or the limit that you are told, until your health care provider says that it is safe. This information is not intended to replace advice given to you by your health care provider. Make sure you discuss any questions you have with your health care provider. Document Revised: 02/11/2017 Document Reviewed: 02/11/2017 Elsevier  Patient Education  [employer].

## 2023-07-25 NOTE — Telephone Encounter (Signed)
 Received a call from [facility] with [facility] calling to report patient placed monitor on today with first documentation at 11:40 am afib rate 81.Message sent to Dr.[name]'s RN.

## 2023-07-26 ENCOUNTER — Ambulatory Visit
Admit: 2023-07-26 | Discharge: 2023-07-26 | Disposition: A | Discharge status: Home / Self Care | Attending: Physician Assistant | Admitting: Physician Assistant

## 2023-07-26 DIAGNOSIS — M47816 Spondylosis without myelopathy or radiculopathy, lumbar region: Secondary | ICD-10-CM | POA: Diagnosis not present

## 2023-07-26 DIAGNOSIS — I493 Ventricular premature depolarization: Secondary | ICD-10-CM | POA: Diagnosis not present

## 2023-07-26 DIAGNOSIS — M4316 Spondylolisthesis, lumbar region: Secondary | ICD-10-CM | POA: Diagnosis not present

## 2023-07-26 DIAGNOSIS — R229 Localized swelling, mass and lump, unspecified: Secondary | ICD-10-CM

## 2023-07-26 DIAGNOSIS — M48061 Spinal stenosis, lumbar region without neurogenic claudication: Secondary | ICD-10-CM | POA: Diagnosis not present

## 2023-07-26 MED ORDER — IOPAMIDOL (ISOVUE-300) INJECTION 61%
100.0000 mL | Freq: Once | INTRAVENOUS | Status: AC | PRN
  Administered 2023-07-26: 100 mL via INTRAVENOUS

## 2023-07-26 NOTE — Telephone Encounter (Signed)
 Pt currently admitted to hospital

## 2023-07-27 ENCOUNTER — Other Ambulatory Visit: Payer: Self-pay

## 2023-07-27 ENCOUNTER — Emergency Department (HOSPITAL_COMMUNITY)
Admission: EM | Admit: 2023-07-27 | Discharge: 2023-07-27 | Disposition: A | Discharge status: Home / Self Care | Attending: Emergency Medicine | Admitting: Emergency Medicine

## 2023-07-27 ENCOUNTER — Encounter (HOSPITAL_COMMUNITY): Payer: Self-pay | Admitting: *Deleted

## 2023-07-27 DIAGNOSIS — Z8546 Personal history of malignant neoplasm of prostate: Secondary | ICD-10-CM | POA: Insufficient documentation

## 2023-07-27 DIAGNOSIS — E119 Type 2 diabetes mellitus without complications: Secondary | ICD-10-CM | POA: Insufficient documentation

## 2023-07-27 DIAGNOSIS — T7840XA Allergy, unspecified, initial encounter: Secondary | ICD-10-CM | POA: Diagnosis not present

## 2023-07-27 DIAGNOSIS — I10 Essential (primary) hypertension: Secondary | ICD-10-CM | POA: Diagnosis not present

## 2023-07-27 DIAGNOSIS — Z85028 Personal history of other malignant neoplasm of stomach: Secondary | ICD-10-CM | POA: Insufficient documentation

## 2023-07-27 DIAGNOSIS — Z96651 Presence of right artificial knee joint: Secondary | ICD-10-CM | POA: Insufficient documentation

## 2023-07-27 DIAGNOSIS — Z7984 Long term (current) use of oral hypoglycemic drugs: Secondary | ICD-10-CM | POA: Insufficient documentation

## 2023-07-27 DIAGNOSIS — Z95810 Presence of automatic (implantable) cardiac defibrillator: Secondary | ICD-10-CM | POA: Insufficient documentation

## 2023-07-27 DIAGNOSIS — I110 Hypertensive heart disease with heart failure: Secondary | ICD-10-CM | POA: Insufficient documentation

## 2023-07-27 DIAGNOSIS — I5042 Chronic combined systolic (congestive) and diastolic (congestive) heart failure: Secondary | ICD-10-CM | POA: Insufficient documentation

## 2023-07-27 LAB — COMPREHENSIVE METABOLIC PANEL WITH GFR
ALT: 15 U/L (ref 0–44)
AST: 25 U/L (ref 15–41)
Albumin: 3.2 g/dL — ABNORMAL LOW (ref 3.5–5.0)
Alkaline Phosphatase: 40 U/L (ref 38–126)
Anion gap: 12 (ref 5–15)
BUN: 20 mg/dL (ref 8–23)
CO2: 21 mmol/L — ABNORMAL LOW (ref 22–32)
Calcium: 8.9 mg/dL (ref 8.9–10.3)
Chloride: 106 mmol/L (ref 98–111)
Creatinine, Ser: 1.4 mg/dL — ABNORMAL HIGH (ref 0.61–1.24)
GFR, Estimated: 53 mL/min — ABNORMAL LOW (ref >60)
Glucose, Bld: 97 mg/dL (ref 70–99)
Potassium: 4.4 mmol/L (ref 3.5–5.1)
Sodium: 139 mmol/L (ref 135–145)
Total Bilirubin: 0.7 mg/dL (ref 0.0–1.2)
Total Protein: 6.1 g/dL — ABNORMAL LOW (ref 6.5–8.1)

## 2023-07-27 LAB — CBC
HCT: 30.5 % — ABNORMAL LOW (ref 39.0–52.0)
Hemoglobin: 9.4 g/dL — ABNORMAL LOW (ref 13.0–17.0)
MCH: 30.1 pg (ref 26.0–34.0)
MCHC: 30.8 g/dL (ref 30.0–36.0)
MCV: 97.8 fL (ref 80.0–100.0)
Platelets: 296 10*3/uL (ref 150–400)
RBC: 3.12 MIL/uL — ABNORMAL LOW (ref 4.22–5.81)
RDW: 15.4 % (ref 11.5–15.5)
WBC: 7.4 10*3/uL (ref 4.0–10.5)
nRBC: 0 % (ref 0.0–0.2)

## 2023-07-27 LAB — TROPONIN I (HIGH SENSITIVITY)
Troponin I (High Sensitivity): 3 ng/L (ref <18)
Troponin I (High Sensitivity): 4 ng/L (ref <18)

## 2023-07-27 MED ORDER — FAMOTIDINE 20 MG PO TABS
20.0000 mg | ORAL_TABLET | Freq: Once | ORAL | Status: AC
  Administered 2023-07-27: 20 mg via ORAL
  Filled 2023-07-27: qty 1

## 2023-07-27 MED ORDER — DIPHENHYDRAMINE HCL 25 MG PO CAPS
25.0000 mg | ORAL_CAPSULE | Freq: Once | ORAL | Status: AC
  Administered 2023-07-27: 25 mg via ORAL
  Filled 2023-07-27: qty 1

## 2023-07-27 MED ORDER — SODIUM CHLORIDE 0.9 % IV BOLUS
1000.0000 mL | Freq: Once | INTRAVENOUS | Status: AC
  Administered 2023-07-27: 1000 mL via INTRAVENOUS

## 2023-07-27 MED ORDER — PREDNISONE 20 MG PO TABS
60.0000 mg | ORAL_TABLET | Freq: Once | ORAL | Status: AC
  Administered 2023-07-27: 60 mg via ORAL
  Filled 2023-07-27: qty 3

## 2023-07-27 NOTE — ED Triage Notes (Signed)
 Patient states he had a CT scan done yest at [facility] Imagining did fine went home states this am woke with a tight feeling in his lower lip and wife thinks his lower lip is swollen. C/o feeling lightheaded with slight nausea c/o heaviness  in his left leg of however this is not new.

## 2023-07-27 NOTE — ED Provider Notes (Signed)
 [facility] EMERGENCY DEPARTMENT AT [facility] Provider Note   CSN: [id number] Arrival date & time: 07/27/23  2841     Patient presents with: Allergic Reaction   Jon Williams is a 73 y.o. male history of A-fib, DM, CHF, NICM, hypertension, gastric and prostate cancer status postradiation remission x 2 years presents with complaints of concern for allergic reaction.  Patient woke up this morning his wife noticed that his lower lip and cheeks were swollen.  Patient endorsed feeling lightheaded and feeling nauseous.  Patient's wife reports that he seemed a little diaphoretic as well.  The symptoms have resolved.  Patient states he feels fine now.  He denies any chest pain, shortness of breath, abdominal pain.  He is status post right heart cath this past Thursday.    Allergic Reaction     Past Medical History:  Diagnosis Date   AICD (automatic cardioverter/defibrillator) present 05/25/2016   biv icd   Bunion, right    Chronic combined systolic and diastolic CHF (congestive heart failure) (HCC)    Echo 1/18: Mild conc LVH, EF 15-20, severe diff HK, inf and inf-septal AK, Gr 3 DD, mild to mod MR, severe LAE, mod reduced RVSF, mod RAE, mild TR, PASP 50   Coronary artery disease involving native coronary artery without angina pectoris 04/17/2016   LHC 1/18: pLCx 30, mLCx 20, mRCA 40, dRCA 20, LVEDP 23, mean RA 8, PA 42/20, PCWP 17   Diabetes mellitus without complication (HCC)    Gastric cancer (HCC)    History of atrial fibrillation    History of atrial flutter    History of cardiomegaly 06/07/2016   Noted on CXR   History of colon polyps 06/28/2017   Noted on colonoscopy   Hypertension    LBBB (left bundle branch block)    Nausea vomiting and diarrhea 04/04/2021   NICM (nonischemic cardiomyopathy) (HCC)    Echo 1/18:  Mild conc LVH, EF 15-20, severe diff HK, inf and inf-septal AK, Gr 3 DD, mild to mod MR, severe LAE, mod reduced RVSF, mod RAE, mild TR, PASP 50    Other secondary pulmonary hypertension (HCC) 04/17/2016   Prostate cancer (HCC) 2019   Sigmoid diverticulosis 06/28/2017   Noted on colonoscopy   Past Surgical History:  Procedure Laterality Date   BIOPSY  12/01/2020   Procedure: BIOPSY;  Surgeon: Alvis Jourdain, MD;  Location: [facility] ENDOSCOPY;  Service: Endoscopy;;   BIOPSY  06/09/2021   Procedure: BIOPSY;  Surgeon: Alvis Jourdain, MD;  Location: [facility] ENDOSCOPY;  Service: Gastroenterology;;   BIOPSY  06/29/2022   Procedure: BIOPSY;  Surgeon: Alvis Jourdain, MD;  Location: [facility] ENDOSCOPY;  Service: Gastroenterology;;   BIV ICD INSERTION CRT-D N/A 05/25/2016   Procedure: BiV ICD Insertion CRT-D;  Surgeon: Tammie Fall, MD;  Location: [facility] INVASIVE CV LAB;  Service: Cardiovascular;  Laterality: N/A;   CARDIAC CATHETERIZATION N/A 03/02/2016   Procedure: Right/Left Heart Cath and Coronary Angiography;  Surgeon: Sammy Crisp, MD;  Location: [facility] INVASIVE CV LAB;  Service: Cardiovascular;  Laterality: N/A;   CARDIOVERSION N/A 07/17/2016   Procedure: Cardioversion;  Surgeon: Tammie Fall, MD;  Location: [facility] INVASIVE CV LAB;  Service: Cardiovascular;  Laterality: N/A;   COLONOSCOPY WITH PROPOFOL  N/A 06/28/2017   Procedure: COLONOSCOPY WITH PROPOFOL ;  Surgeon: Alvis Jourdain, MD;  Location: [facility] ENDOSCOPY;  Service: Endoscopy;  Laterality: N/A;   colonscopy  2009   ESOPHAGOGASTRODUODENOSCOPY N/A 12/01/2020   Procedure: ESOPHAGOGASTRODUODENOSCOPY (EGD);  Surgeon: Alvis Jourdain, MD;  Location: [facility]  END[facility];  Service: Endoscopy;  Laterality: N/A;  IDA/guaiac positive stools   ESOPHAGOGASTRODUODENOSCOPY (EGD) WITH PROPOFOL  N/A 12/16/2020   Procedure: ESOPHAGOGASTRODUODENOSCOPY (EGD) WITH PROPOFOL ;  Surgeon: Alvis Jourdain, MD;  Location: [facility] ENDOSCOPY;  Service: Endoscopy;  Laterality: N/A;   ESOPHAGOGASTRODUODENOSCOPY (EGD) WITH PROPOFOL  N/A 06/09/2021   Procedure: ESOPHAGOGASTRODUODENOSCOPY (EGD) WITH PROPOFOL ;  Surgeon: Alvis Jourdain, MD;  Location: [facility] ENDOSCOPY;   Service: Gastroenterology;  Laterality: N/A;   ESOPHAGOGASTRODUODENOSCOPY (EGD) WITH PROPOFOL  N/A 06/29/2022   Procedure: ESOPHAGOGASTRODUODENOSCOPY (EGD) WITH PROPOFOL ;  Surgeon: Alvis Jourdain, MD;  Location: [facility] ENDOSCOPY;  Service: Gastroenterology;  Laterality: N/A;   GOLD SEED IMPLANT N/A 12/24/2017   Procedure: GOLD SEED IMPLANT, TRANSERINEAL;  Surgeon: Christina Coyer, MD;  Location: [facility] ORS;  Service: Urology;  Laterality: N/A;   INSERT / REPLACE / REMOVE PACEMAKER     LEAD REVISION  10/10/2018   LEAD REVISION/REPAIR N/A 10/10/2018   Procedure: LEAD REVISION/REPAIR;  Surgeon: Tammie Fall, MD;  Location: [facility] INVASIVE CV LAB;  Service: Cardiovascular;  Laterality: N/A;   POLYPECTOMY  06/28/2017   Procedure: POLYPECTOMY;  Surgeon: Alvis Jourdain, MD;  Location: [facility] ENDOSCOPY;  Service: Endoscopy;;  ascending and descending colon polyp   PORTACATH PLACEMENT Right 12/23/2020   Procedure: INSERTION PORT-A-CATH;  Surgeon: Lujean Sake, MD;  Location: [facility] OR;  Service: General;  Laterality: Right;   PROSTATE BIOPSY  02/20/2017   RIGHT HEART CATH N/A 07/25/2023   Procedure: RIGHT HEART CATH;  Surgeon: Darlis Eisenmenger, MD;  Location: [facility] INVASIVE CV LAB;  Service: Cardiovascular;  Laterality: N/A;   RIGHT/LEFT HEART CATH AND CORONARY ANGIOGRAPHY N/A 10/30/2022   Procedure: RIGHT/LEFT HEART CATH AND CORONARY ANGIOGRAPHY;  Surgeon: Darlis Eisenmenger, MD;  Location: [facility] INVASIVE CV LAB;  Service: Cardiovascular;  Laterality: N/A;   SPACE OAR INSTILLATION N/A 12/24/2017   Procedure: SPACE OAR INSTILLATION;  Surgeon: Christina Coyer, MD;  Location: [facility] ORS;  Service: Urology;  Laterality: N/A;   TOTAL KNEE ARTHROPLASTY Right 08/16/2017   Procedure: RIGHT TOTAL KNEE ARTHROPLASTY;  Surgeon: Marlena Sima, MD;  Location: [facility] OR;  Service: Orthopedics;  Laterality: Right;   UPPER ESOPHAGEAL ENDOSCOPIC ULTRASOUND (EUS) N/A 12/16/2020   Procedure: UPPER ESOPHAGEAL ENDOSCOPIC ULTRASOUND (EUS);  Surgeon: Alvis Jourdain, MD;  Location: [facility] ENDOSCOPY;  Service: Endoscopy;  Laterality: N/A;     Prior to Admission medications   Medication Sig Start Date End Date Taking? Authorizing Provider  Accu-Chek Softclix Lancets lancets Use to check your blood sugar finger stick once a day Dx Code E11.9 90 days 05/27/23     atorvastatin  (LIPITOR) 40 MG tablet Take 1 tablet (40 mg total) by mouth daily. 05/20/23   Elmarie Hacking, FNP  Biotin 1000 MCG tablet Take 1,000 mcg by mouth daily with breakfast.    [provider]  Blood Glucose Monitoring Suppl (ACCU-CHEK GUIDE) w/Device KIT as directed to check blood sugar once a day 05/27/23     cholecalciferol  (VITAMIN D3) 25 MCG (1000 UT) tablet Take 1,000 Units by mouth daily with breakfast.    [provider]  digoxin  (LANOXIN ) 0.125 MG tablet Take 1/2 tablet (0.0625mg  total) by mouth daily. 03/15/23   Milford, Arlice Bene, FNP  empagliflozin  (JARDIANCE ) 10 MG TABS tablet Take 1 tablet (10 mg total) by mouth daily. 11/16/22     famotidine  (PEPCID ) 20 MG tablet Take 1 tablet (20 mg total) by mouth once a day at bedtime as needed. Patient taking differently: Take 20 mg by mouth at bedtime. 07/05/23  ferrous sulfate 325 (65 FE) MG tablet Take 325 mg by mouth daily with breakfast.    [provider]  furosemide  (LASIX ) 40 MG tablet Take 1 tablet (40 mg total) by mouth daily. 11/12/22   Milford, Arlice Bene, FNP  gabapentin  (NEURONTIN ) 100 MG capsule Take 1 capsule (100 mg total) by mouth 2 (two) times daily. 03/25/23   Burton, Lacie K, NP  glucose blood (ACCU-CHEK GUIDE TEST) test strip Use to check blood sugars once daily. 05/27/23   Merl Star, MD  ketoconazole  (NIZORAL ) 2 % cream Apply 1 Application topically 2 (two) times daily. Patient taking differently: Apply 1 Application topically daily as needed for irritation. 09/26/22     lidocaine -prilocaine  (EMLA ) cream Apply 1 Application topically as needed. 04/03/23   Sonja Neibert, MD  loratadine   (CLARITIN ) 10 MG tablet Take 10 mg by mouth daily.    [provider]  metFORMIN  (GLUCOPHAGE -XR) 500 MG 24 hr tablet Take 1 tablet (500 mg total) by mouth with evening meal 09/21/22     metoprolol  succinate (TOPROL -XL) 25 MG 24 hr tablet Take 1 tablet (25 mg total) by mouth daily. 03/15/23   Elmarie Hacking, FNP  multivitamin (ONE-A-DAY MEN'S) TABS tablet Take 1 tablet by mouth daily with breakfast.    [provider]  Naftifine HCl (NAFTIN) 2 % GEL Apply 1 application  topically every Monday, Wednesday, and Friday.    [provider]  pantoprazole  (PROTONIX ) 40 MG tablet Take 1 tablet (40 mg total) by mouth 2 (two) times daily. 01/04/23     potassium chloride  (KLOR-CON ) 10 MEQ tablet Take 2 tablets (20 mEq total) by mouth daily. Patient taking differently: Take 10 mEq by mouth 2 (two) times daily. 11/12/22   Milford, Arlice Bene, FNP  rivaroxaban  (XARELTO ) 20 MG TABS tablet Take 1 tablet (20 mg total) by mouth daily with supper. 03/15/23   Milford, Arlice Bene, FNP  sacubitril -valsartan  (ENTRESTO ) 24-26 MG Take 1 tablet by mouth 2 (two) times daily. 11/12/22   Milford, Arlice Bene, FNP  spironolactone  (ALDACTONE ) 25 MG tablet Take 1 tablet (25 mg total) by mouth daily. 03/15/23   Elmarie Hacking, FNP  sucralfate  (CARAFATE ) 1 g tablet Take 1 tablet (1 g total) by mouth daily on an empty stomach. 07/05/23     Tavaborole 5 % SOLN Apply 1 application  topically daily as needed (Nail on left hand).    [provider]  prochlorperazine  (COMPAZINE ) 10 MG tablet Take 1 tablet (10 mg total) by mouth every 6 (six) hours as needed (Nausea or vomiting). 02/15/21 05/12/21  Sonja , MD    Allergies: Aspirin  and Sulfa antibiotics    Review of Systems  Neurological:  Positive for dizziness.    Updated Vital Signs BP (!) 106/51   Pulse (!) 50   Temp 97.7 F (36.5 C)   Resp 16   Ht 5' 6 (1.676 m)   Wt 74.8 kg   SpO2 100%   BMI 26.62 kg/m   Physical Exam Vitals and  nursing note reviewed.  Constitutional:      General: He is not in acute distress.    Appearance: He is well-developed.  HENT:     Head: Normocephalic and atraumatic.     Mouth/Throat:     Comments: Airway is patent without any angioedema  Eyes:     Conjunctiva/sclera: Conjunctivae normal.    Cardiovascular:     Rate and Rhythm: Normal rate and regular rhythm.     Heart sounds: No  murmur heard. Pulmonary:     Effort: Pulmonary effort is normal. No respiratory distress.     Breath sounds: Normal breath sounds.  Abdominal:     Palpations: Abdomen is soft.     Tenderness: There is no abdominal tenderness.   Musculoskeletal:        General: No swelling.     Cervical back: Neck supple.   Skin:    General: Skin is warm and dry.     Capillary Refill: Capillary refill takes less than 2 seconds.   Neurological:     Mental Status: He is alert.   Psychiatric:        Mood and Affect: Mood normal.     (all labs ordered are listed, but only abnormal results are displayed) Labs Reviewed  CBC - Abnormal; Notable for the following components:      Result Value   RBC 3.12 (*)    Hemoglobin 9.4 (*)    HCT 30.5 (*)    All other components within normal limits  COMPREHENSIVE METABOLIC PANEL WITH GFR - Abnormal; Notable for the following components:   CO2 21 (*)    Creatinine, Ser 1.40 (*)    Total Protein 6.1 (*)    Albumin 3.2 (*)    GFR, Estimated 53 (*)    All other components within normal limits  TRYPTASE  TROPONIN I (HIGH SENSITIVITY)  TROPONIN I (HIGH SENSITIVITY)    EKG: EKG Interpretation Date/Time:  Saturday July 27 2023 08:06:52 EDT Ventricular Rate:  87 PR Interval:    QRS Duration:  207 QT Interval:  475 QTC Calculation: 483 R Axis:   258  Text Interpretation: Atrial fibrillation Paired ventricular premature complexes IVCD, consider atypical LBBB No significant change since last tracing Confirmed by Scarlette Currier ([id number]) on 07/27/2023 8:41:25  AM  Radiology: CT LUMBAR SPINE W CONTRAST Result Date: 07/26/2023 CLINICAL DATA:  Palpable not appreciated by patient's wife over the lower spine. EXAM: CT LUMBAR SPINE WITH CONTRAST TECHNIQUE: Multidetector CT imaging of the lumbar spine was performed with intravenous contrast administration. RADIATION DOSE REDUCTION: This exam was performed according to the departmental dose-optimization program which includes automated exposure control, adjustment of the mA and/or kV according to patient size and/or use of iterative reconstruction technique. CONTRAST:  100mL ISOVUE -300 IOPAMIDOL  (ISOVUE -300) INJECTION 61% COMPARISON:  CT chest abdomen pelvis 04/30/2023. FINDINGS: Segmentation: There are 5 non-rib-bearing lumbar type vertebral bodies. The lowest rib-bearing vertebra is labeled T12. The lowest well-formed disc space is labeled L5-S1. Alignment: Lumbar lordosis is maintained. Grade 2 anterolisthesis of L4 on L5. Vertebrae: No compression fracture or displaced fracture in the lumbar spine. Bilateral pars defects at L4. No suspicious lytic or blastic osseous lesion. Degenerative endplate changes and endplate sclerosis at L4-5. Degenerative changes of the bilateral sacroiliac joints and of the partially visualized pubic symphysis. Paraspinal and other soft tissues: The paraspinal soft tissues are unremarkable. Mild atherosclerosis of the abdominal aorta and branch vessels. Disc levels: Small disc bulges at multiple levels. Severe disc space narrowing at L4-5. Grade 2 anterolisthesis at L4-5 with uncovering of the disc. Diffuse disc bulge and facet arthrosis at L4-5 resulting in severe spinal canal stenosis. There is severe bilateral foraminal stenosis at L4-5 with likely impingement upon the exiting L4 nerve roots. No additional high-grade spinal canal stenosis in the lumbar spine. IMPRESSION: No acute abnormality of the lumbar spine. Degenerative changes as above. Disc bulge and facet arthrosis at L4-5  resulting in severe spinal canal stenosis. Severe bilateral foraminal stenosis at  L4-5 with impingement upon the exiting L4 nerve roots. Bilateral pars defects at L4 with grade 2 anterolisthesis of L4 on L5. Electronically Signed   By: Denny Flack M.D.   On: 07/26/2023 16:19     Procedures   Medications Ordered in the ED  diphenhydrAMINE  (BENADRYL ) capsule 25 mg (25 mg Oral Given 07/27/23 0801)  famotidine  (PEPCID ) tablet 20 mg (20 mg Oral Given 07/27/23 0801)  predniSONE (DELTASONE) tablet 60 mg (60 mg Oral Given 07/27/23 0801)  sodium chloride  0.9 % bolus 1,000 mL (1,000 mLs Intravenous New Bag/Given 07/27/23 1021)                                    Medical Decision Making Amount and/or Complexity of Data Reviewed Labs: ordered.  Risk Prescription drug management.   This patient presents to the ED with chief complaint(s) of facial swelling.  The complaint involves an extensive differential diagnosis and also carries with it a high risk of complications and morbidity.   Pertinent past medical history as listed in HPI  The differential diagnosis includes  Allergic reaction, ACS, CVA, TIA Additional history obtained: Additional history obtained from spouse Records reviewed Care Everywhere/External Records  Assessment and management:   Hemodynamically stable, nontoxic-appearing patient presenting with complaints of facial swelling that started early this morning.  He was experiencing some dizziness and nausea.  His wife noted that he was diaphoretic as well.  Not associated with any difficulty swallowing, headache, weakness, chest pain, shortness of breath, abdominal pain or vomiting.  Patient did have a CT scan done yesterday with IV contrast.  They are concerned that he had allergic reaction.  Although he has had contrast imaging done in the past.  Currently he has no angioedema.  His airway is patent.  No rash or urticaria noted.  His lung sounds are clear without any stridor.  Will  empirically treat as allergic reaction with Benadryl , Pepcid  and prednisone.  He has no neurodeficits on exam do not suspect CVA or TIA.  He is status post right heart cath this past Thursday.  Will obtain routine labs and cardiac eval.  Lab work notable for mildly elevated creatinine, fluid bolus given here while in the ED.  Remainder of lab work is unremarkable.  No evidence of cardiac etiology.  On exam he continues to be without any angioedema, airway is patent, no urticaria.  He is entirely asymptomatic at this point.  Offered to send in prescriptions for allergies.  They would defer at this time.  Recommended just taken another dose of Benadryl  later today.  They are requesting discharge.  Will provide a referral for allergy specialist.  Independent ECG interpretation:  A-fib, IVCD, occasional PVCs, unchanged  Independent labs interpretation:  The following labs were independently interpreted:  CBC without leukocytosis, hemoglobin stable, troponin without elevation, CMP with mildly elevated creatinine  Independent visualization and interpretation of imaging: I independently visualized the following imaging with scope of interpretation limited to determining acute life threatening conditions related to emergency care: none    Consultations obtained:   none  Disposition:   Patient will be discharged home. The patient has been appropriately medically screened and/or stabilized in the ED. I have low suspicion for any other emergent medical condition which would require further screening, evaluation or treatment in the ED or require inpatient management. At time of discharge the patient is hemodynamically stable and in no acute distress.  I have discussed work-up results and diagnosis with patient and answered all questions. Patient is agreeable with discharge plan. We discussed strict return precautions for returning to the emergency department and they verbalized understanding.     Social  Determinants of Health:   none  This note was dictated with voice recognition software.  Despite best efforts at proofreading, errors may have occurred which can change the documentation meaning.       Final diagnoses:  Allergic reaction, initial encounter    ED Discharge Orders     None          Stanton Earthly 07/27/23 1154    Scarlette Currier, MD 07/29/23 Georgana Kilts

## 2023-07-27 NOTE — Discharge Instructions (Addendum)
 You were elevated in the emergency room for allergic reaction.  Your lab show that you are mildly dehydrated.  Please drink plenty water today.  Otherwise there are no significant findings.  Please return the emergency room if you experience any worsening symptoms.  You have been provided a referral for an allergy specialist.  Please call make an appointment at your earliest convenience.

## 2023-07-29 ENCOUNTER — Other Ambulatory Visit (HOSPITAL_COMMUNITY): Payer: Self-pay

## 2023-07-29 LAB — TRYPTASE: Tryptase: 6.1 ug/L (ref 2.2–13.2)

## 2023-07-30 NOTE — Progress Notes (Signed)
 New Patient Note  RE: Jon Williams MRN: [id number] DOB: [birth year] Date of Office Visit: 07/31/2023  Consult requested by: Jon Star, MD Primary care provider: Merl Star, MD  Chief Complaint: Establish Care (Pt reports that he had iv contrast with imaging. Developed swelling of the lips within 24 hours. Went to ER.)  History of Present Illness: I had the pleasure of seeing Jon Williams for initial evaluation at the [facility] on 07/31/2023. He is a 73 y.o. male, who is referred here by the ER for the evaluation of allergic reaction. He is accompanied today by his wife who provided/contributed to the history.   Discussed the use of AI scribe software for clinical note transcription with the patient, who gave verbal consent to proceed.    Four days ago, he experienced facial and lip swelling, redness, and cold sweats upon waking at 6:30 AM. A CT scan with IV contrast performed the previous afternoon around 1-2PM. He had no immediate reaction to the contrast and had tolerated it well in the past. No difficulty breathing, swallowing, or talking was noted, but he did experience hoarseness. He did not consume any new foods or medications prior to the episode, having eaten a usual dinner of baked chicken, mashed potatoes, and spinach the night before. The swelling improved within a few hours.   He has a history of similar swelling episodes, the last occurring many years ago, attributed to allergies to aspirin  and sulfa drugs. It is unclear whether he was on blood pressure medications at that time, as he and his wife do not recall for certain.   He takes sacubitril /valsartan  (Entresto ) for blood pressure, which he has been on for years. He takes this medication twice daily, in the morning and at night. He has a history of prostate and gastrointestinal cancer, treated with radiation and chemotherapy, and is currently in remission. He experiences side effects from past  treatments, including runny nose and eyes, for which he takes Claritin .  He has atrial fibrillation and is on blood thinners.    No family history of swelling, asthma, food allergies, bee sting allergies, hives, eczema, or seasonal allergies. No recent fevers, chills, or changes in appetite or bathroom habits, though he eats less to maintain his weight and was noted to be dehydrated recently.      Aspirin  and sulfa drug caused similar swelling episode long time ago.  Patient had 3 episodes ol angioedema to date. Family history of angioedema: no.  Patient has h/o prostate cancer s/p radiation, gastric cancer s/p chemotherapy. Currently on remission.   Ace-inhibitor use: yes   07/27/2023 ER visit: Jon Williams is a 74 y.o. male history of A-fib, DM, CHF, NICM, hypertension, gastric and prostate cancer status postradiation remission x 2 years presents with complaints of concern for allergic reaction.  Patient woke up this morning his wife noticed that his lower lip and cheeks were swollen.  Patient endorsed feeling lightheaded and feeling nauseous.  Patient's wife reports that he seemed a little diaphoretic as well.  The symptoms have resolved.  Patient states he feels fine now.  He denies any chest pain, shortness of breath, abdominal pain.  He is status post right heart cath this past Thursday.   Hemodynamically stable, nontoxic-appearing patient presenting with complaints of facial swelling that started early this morning.  He was experiencing some dizziness and nausea.  His wife noted that he was diaphoretic as well.  Not associated with any difficulty swallowing,  headache, weakness, chest pain, shortness of breath, abdominal pain or vomiting.  Patient did have a CT scan done yesterday with IV contrast.  They are concerned that he had allergic reaction.  Although he has had contrast imaging done in the past.  Currently he has no angioedema.  His airway is patent.  No rash or urticaria  noted.  His lung sounds are clear without any stridor.  Will empirically treat as allergic reaction with Benadryl , Pepcid  and prednisone.  He has no neurodeficits on exam do not suspect CVA or TIA.  He is status post right heart cath this past Thursday.  Will obtain routine labs and cardiac eval.   Lab work notable for mildly elevated creatinine, fluid bolus given here while in the ED.  Remainder of lab work is unremarkable.  No evidence of cardiac etiology.  On exam he continues to be without any angioedema, airway is patent, no urticaria.  He is entirely asymptomatic at this point.  Offered to send in prescriptions for allergies.  They would defer at this time.  Recommended just taken another dose of Benadryl  later today.  They are requesting discharge.  Will provide a referral for allergy specialist.  Assessment and Plan: Wyndell is a 73 y.o. male with: Angioedema, initial encounter Allergic reaction, initial encounter Facial swelling with no clear trigger. Unlikely to be due to IV conrast due to timeline. 2 other episodes of angioedema in the remote past and it is unclear if he was on any ace inhibitor medications at that time. History of prostate and gastric cancer. Angioedema may be isolated or associated with urticaria. It may be histamine-induced (i.e. foods or certain medications) or bradykinin-mediated (i.e. Hereditary Angioedema or ACE-I). Patients with histamine-induced angioedema are more likely to have associated symptoms of urticaria, pruritus or signs of anaphylaxis. Patients with bradykinin-mediated do not have any symptoms of pruritus or urticaria and they did not improve with Benadryl  or Epinephrine . ACE-inhibitors are notorious to cause angioedema and has an incidence of 0.1-0.7% and orolingual involvement is common. ACE is involved in the degradation of bradykinin and blocking it can cause increase in bradykinin resulting in angioedema. Avoid all ACE-inhibitors in the future. Try to  avoid NSAIDs if possible as this class has been known to lower threshold for angioedema.  Please call your cardiologist regarding stopping ENTRESTO  (which contains an ace-inhibitor type of medications).  Get bloodwork to rule out any other systemic issues.  I have prescribed epinephrine  injectable device and demonstrated proper use. For mild symptoms you can take over the counter antihistamines (zyrtec 10mg  to 20mg ) and monitor symptoms closely.  If symptoms worsen or if you have severe symptoms including breathing issues, throat closure, significant swelling, whole body hives, severe diarrhea and vomiting, lightheadedness then use epinephrine  and seek immediate medical care afterwards. Emergency action plan given.  Multiple drug allergies Avoid aspirin  and sulfa  Rhinorrhea Use Atrovent (ipratropium) 0.03% 1-2 sprays per nostril twice a day as needed for runny nose/drainage.  Return in about 4 weeks (around 08/28/2023).  Meds ordered this encounter  Medications   ipratropium (ATROVENT) 0.03 % nasal spray    Sig: Place 1-2 sprays into both nostrils 2 (two) times daily as needed (nasal drainage).    Dispense:  30 mL    Refill:  2   EPINEPHrine  0.3 mg/0.3 mL IJ SOAJ injection    Sig: Inject 0.3 mg into the muscle as needed for anaphylaxis.    Dispense:  2 each    Refill:  1  May dispense generic/Mylan/Teva brand.   Lab Orders         Alpha-Gal Panel         ANA, IFA (with reflex)         C1 Esterase Inhibitor         C1 esterase inhibitor, functional         C3 and C4         Complement component c1q         C-reactive protein         Tryptase         Thyroid  Cascade Profile         Sedimentation rate         Protein Electrophoresis, Urine Rflx.         Protein electrophoresis, serum         Food Allergy Profile      Other allergy screening: Asthma: no Rhino conjunctivitis: no Food allergy: no Medication allergy: yes Hymenoptera allergy: no Urticaria:  no Eczema:no History of recurrent infections suggestive of immunodeficency: no  Diagnostics: None.    Past Medical History: Patient Active Problem List   Diagnosis Date Noted   Acute on chronic systolic (congestive) heart failure (HCC) 10/29/2022   Acute on chronic systolic heart failure (HCC) 10/29/2022   Obesity (BMI 30-39.9) 12/25/2021   Community acquired pneumonia of right lower lobe of lung 12/25/2021   Malnutrition of moderate degree 09/15/2021   DM (diabetes mellitus), type 2 (HCC) 09/14/2021   Essential hypertension 09/13/2021   Dyslipidemia 09/13/2021   DNR (do not resuscitate) 09/13/2021   Port-A-Cath in place 06/15/2021   Hypotension due to hypovolemia 04/04/2021   Nausea vomiting and diarrhea 04/04/2021   Thrombocytopenia (HCC) 04/04/2021   Leukocytosis 04/04/2021   Acute renal failure superimposed on stage 3a chronic kidney disease (HCC) 04/04/2021   Diarrhea 04/01/2021   Occult blood in stools 04/01/2021   Foot ulcer, right (HCC) 04/01/2021   Hypotension 04/01/2021   Dehydration 04/01/2021   Iron  deficiency anemia 12/12/2020   Gastric cancer (HCC) 12/08/2020   Symptomatic anemia 11/30/2020   Gastrointestinal hemorrhage    Corns and callosities 08/07/2019   Blood clotting disorder (HCC) 05/06/2019   Coagulation defect (HCC) 05/06/2019   Pacemaker lead malfunction 10/10/2018   Pacemaker complications 10/10/2018   Pacemaker lead failure, sequela 10/10/2018   S/P knee replacement 08/16/2017   S/P total knee arthroplasty 08/16/2017   Malignant neoplasm of prostate (HCC) 04/02/2017   ICD (implantable cardioverter-defibrillator) in place    Persistent atrial fibrillation (HCC) 05/25/2016   Coronary artery disease involving native coronary artery without angina pectoris 04/17/2016   Other secondary pulmonary hypertension (HCC) 04/17/2016   Acute on chronic combined systolic and diastolic CHF (congestive heart failure) (HCC)    NICM (nonischemic cardiomyopathy)  (HCC)    LBBB (left bundle branch block)    Past Medical History:  Diagnosis Date   AICD (automatic cardioverter/defibrillator) present 05/25/2016   biv icd   Angio-edema    Bunion, right    Chronic combined systolic and diastolic CHF (congestive heart failure) (HCC)    Echo 1/18: Mild conc LVH, EF 15-20, severe diff HK, inf and inf-septal AK, Gr 3 DD, mild to mod MR, severe LAE, mod reduced RVSF, mod RAE, mild TR, PASP 50   Coronary artery disease involving native coronary artery without angina pectoris 04/17/2016   LHC 1/18: pLCx 30, mLCx 20, mRCA 40, dRCA 20, LVEDP 23, mean RA 8, PA 42/20, PCWP 17   Diabetes  mellitus without complication (HCC)    Gastric cancer (HCC)    History of atrial fibrillation    History of atrial flutter    History of cardiomegaly 06/07/2016   Noted on CXR   History of colon polyps 06/28/2017   Noted on colonoscopy   Hypertension    LBBB (left bundle branch block)    Nausea vomiting and diarrhea 04/04/2021   NICM (nonischemic cardiomyopathy) (HCC)    Echo 1/18:  Mild conc LVH, EF 15-20, severe diff HK, inf and inf-septal AK, Gr 3 DD, mild to mod MR, severe LAE, mod reduced RVSF, mod RAE, mild TR, PASP 50   Other secondary pulmonary hypertension (HCC) 04/17/2016   Prostate cancer (HCC) 2019   Sigmoid diverticulosis 06/28/2017   Noted on colonoscopy   Past Surgical History: Past Surgical History:  Procedure Laterality Date   BIOPSY  12/01/2020   Procedure: BIOPSY;  Surgeon: Alvis Jourdain, MD;  Location: [facility] ENDOSCOPY;  Service: Endoscopy;;   BIOPSY  06/09/2021   Procedure: BIOPSY;  Surgeon: Alvis Jourdain, MD;  Location: [facility] ENDOSCOPY;  Service: Gastroenterology;;   BIOPSY  06/29/2022   Procedure: BIOPSY;  Surgeon: Alvis Jourdain, MD;  Location: [facility] ENDOSCOPY;  Service: Gastroenterology;;   BIV ICD INSERTION CRT-D N/A 05/25/2016   Procedure: BiV ICD Insertion CRT-D;  Surgeon: Tammie Fall, MD;  Location: [facility] INVASIVE CV LAB;  Service: Cardiovascular;   Laterality: N/A;   CARDIAC CATHETERIZATION N/A 03/02/2016   Procedure: Right/Left Heart Cath and Coronary Angiography;  Surgeon: Sammy Crisp, MD;  Location[facility] INVASIVE CV LAB;  Service: Cardiovascular;  Laterality: N/A;   CARDIOVERSION N/A 07/17/2016   Procedure: Cardioversion;  Surgeon: Tammie Fall, MD;  Location: [facility] INVASIVE CV LAB;  Service: Cardiovascular;  Laterality: N/A;   COLONOSCOPY WITH PROPOFOL  N/A 06/28/2017   Procedure: COLONOSCOPY WITH PROPOFOL ;  Surgeon: Alvis Jourdain, MD;  Location: [facility] ENDOSCOPY;  Service: Endoscopy;  Laterality: N/A;   colonscopy  2009   ESOPHAGOGASTRODUODENOSCOPY N/A 12/01/2020   Procedure: ESOPHAGOGASTRODUODENOSCOPY (EGD);  Surgeon: Alvis Jourdain, MD;  Location: [facility] ENDOSCOPY;  Service: Endoscopy;  Laterality: N/A;  IDA/guaiac positive stools   ESOPHAGOGASTRODUODENOSCOPY (EGD) WITH PROPOFOL  N/A 12/16/2020   Procedure: ESOPHAGOGASTRODUODENOSCOPY (EGD) WITH PROPOFOL ;  Surgeon: Alvis Jourdain, MD;  Location: [facility] ENDOSCOPY;  Service: Endoscopy;  Laterality: N/A;   ESOPHAGOGASTRODUODENOSCOPY (EGD) WITH PROPOFOL  N/A 06/09/2021   Procedure: ESOPHAGOGASTRODUODENOSCOPY (EGD) WITH PROPOFOL ;  Surgeon: Alvis Jourdain, MD;  Location: [facility] ENDOSCOPY;  Service: Gastroenterology;  Laterality: N/A;   ESOPHAGOGASTRODUODENOSCOPY (EGD) WITH PROPOFOL  N/A 06/29/2022   Procedure: ESOPHAGOGASTRODUODENOSCOPY (EGD) WITH PROPOFOL ;  Surgeon: Alvis Jourdain, MD;  Location: [facility] ENDOSCOPY;  Service: Gastroenterology;  Laterality: N/A;   GOLD SEED IMPLANT N/A 12/24/2017   Procedure: GOLD SEED IMPLANT, TRANSERINEAL;  Surgeon: Christina Coyer, MD;  Location: [facility] ORS;  Service: Urology;  Laterality: N/A;   INSERT / REPLACE / REMOVE PACEMAKER     LEAD REVISION  10/10/2018   LEAD REVISION/REPAIR N/A 10/10/2018   Procedure: LEAD REVISION/REPAIR;  Surgeon: Tammie Fall, MD;  Location: [facility] INVASIVE CV LAB;  Service: Cardiovascular;  Laterality: N/A;   POLYPECTOMY  06/28/2017   Procedure: POLYPECTOMY;   Surgeon: Alvis Jourdain, MD;  Location: [facility] ENDOSCOPY;  Service: Endoscopy;;  ascending and descending colon polyp   PORTACATH PLACEMENT Right 12/23/2020   Procedure: INSERTION PORT-A-CATH;  Surgeon: Lujean Sake, MD;  Location: [facility] OR;  Service: General;  Laterality: Right;   PROSTATE BIOPSY  02/20/2017   RIGHT HEART CATH N/A 07/25/2023   Procedure: RIGHT HEART  CATH;  Surgeon: Darlis Eisenmenger, MD;  Location: [facility] INVASIVE CV LAB;  Service: Cardiovascular;  Laterality: N/A;   RIGHT/LEFT HEART CATH AND CORONARY ANGIOGRAPHY N/A 10/30/2022   Procedure: RIGHT/LEFT HEART CATH AND CORONARY ANGIOGRAPHY;  Surgeon: Darlis Eisenmenger, MD;  Location: [facility] INVASIVE CV LAB;  Service: Cardiovascular;  Laterality: N/A;   SPACE OAR INSTILLATION N/A 12/24/2017   Procedure: SPACE OAR INSTILLATION;  Surgeon: Christina Coyer, MD;  Location: [facility] ORS;  Service: Urology;  Laterality: N/A;   TOTAL KNEE ARTHROPLASTY Right 08/16/2017   Procedure: RIGHT TOTAL KNEE ARTHROPLASTY;  Surgeon: Marlena Sima, MD;  Location: [facility] OR;  Service: Orthopedics;  Laterality: Right;   UPPER ESOPHAGEAL ENDOSCOPIC ULTRASOUND (EUS) N/A 12/16/2020   Procedure: UPPER ESOPHAGEAL ENDOSCOPIC ULTRASOUND (EUS);  Surgeon: Alvis Jourdain, MD;  Location: [facility] ENDOSCOPY;  Service: Endoscopy;  Laterality: N/A;   Medication List:  Current Outpatient Medications  Medication Sig Dispense Refill   Accu-Chek Softclix Lancets lancets Use to check your blood sugar finger stick once a day Dx Code E11.9 90 days 100 each 3   atorvastatin  (LIPITOR) 40 MG tablet Take 1 tablet (40 mg total) by mouth daily. 90 tablet 3   Biotin 1000 MCG tablet Take 1,000 mcg by mouth daily with breakfast.     Blood Glucose Monitoring Suppl (ACCU-CHEK GUIDE) w/Device KIT as directed to check blood sugar once a day 1 kit 0   cholecalciferol  (VITAMIN D3) 25 MCG (1000 UT) tablet Take 1,000 Units by mouth daily with breakfast.     digoxin  (LANOXIN ) 0.125 MG tablet Take 1/2 tablet (0.0625mg   total) by mouth daily. 15 tablet 6   empagliflozin  (JARDIANCE ) 10 MG TABS tablet Take 1 tablet (10 mg total) by mouth daily. 90 tablet 3   EPINEPHrine  0.3 mg/0.3 mL IJ SOAJ injection Inject 0.3 mg into the muscle as needed for anaphylaxis. 2 each 1   famotidine  (PEPCID ) 20 MG tablet Take 1 tablet (20 mg total) by mouth once a day at bedtime as needed. 30 tablet 3   ferrous sulfate 325 (65 FE) MG tablet Take 325 mg by mouth daily with breakfast.     furosemide  (LASIX ) 40 MG tablet Take 1 tablet (40 mg total) by mouth daily. 30 tablet 6   gabapentin  (NEURONTIN ) 100 MG capsule Take 1 capsule (100 mg total) by mouth 2 (two) times daily. 180 capsule 1   glucose blood (ACCU-CHEK GUIDE TEST) test strip Use to check blood sugars once daily. 100 each 3   ipratropium (ATROVENT) 0.03 % nasal spray Place 1-2 sprays into both nostrils 2 (two) times daily as needed (nasal drainage). 30 mL 2   ketoconazole  (NIZORAL ) 2 % cream Apply 1 Application topically 2 (two) times daily. 30 g 3   lidocaine -prilocaine  (EMLA ) cream Apply 1 Application topically as needed. 30 g 0   loratadine  (CLARITIN ) 10 MG tablet Take 10 mg by mouth daily.     metFORMIN  (GLUCOPHAGE -XR) 500 MG 24 hr tablet Take 1 tablet (500 mg total) by mouth with evening meal 90 tablet 3   metoprolol  succinate (TOPROL -XL) 25 MG 24 hr tablet Take 1 tablet (25 mg total) by mouth daily. 90 tablet 3   multivitamin (ONE-A-DAY MEN'S) TABS tablet Take 1 tablet by mouth daily with breakfast.     Naftifine HCl (NAFTIN) 2 % GEL Apply 1 application  topically every Monday, Wednesday, and Friday.     pantoprazole  (PROTONIX ) 40 MG tablet Take 1 tablet (40 mg total) by mouth 2 (two) times daily. 180 tablet  3   potassium chloride  (KLOR-CON ) 10 MEQ tablet Take 2 tablets (20 mEq total) by mouth daily. 60 tablet 3   rivaroxaban  (XARELTO ) 20 MG TABS tablet Take 1 tablet (20 mg total) by mouth daily with supper. 90 tablet 3   spironolactone  (ALDACTONE ) 25 MG tablet Take 1  tablet (25 mg total) by mouth daily. 90 tablet 3   sucralfate  (CARAFATE ) 1 g tablet Take 1 tablet (1 g total) by mouth daily on an empty stomach. 30 tablet 3   Tavaborole 5 % SOLN Apply 1 application  topically daily as needed (Nail on left hand).     No current facility-administered medications for this visit.   Allergies: Allergies  Allergen Reactions   Aspirin  Anaphylaxis and Hives   Sulfa Antibiotics Anaphylaxis, Hives, Swelling and Other (See Comments)    Swollen lips   Ace Inhibitors     Possible angioedema   Social History: Social History   Socioeconomic History   Marital status: Married    Spouse name: Not on file   Number of children: 2   Years of education: Not on file   Highest education level: Not on file  Occupational History   Occupation: retired  Tobacco Use   Smoking status: Never    Passive exposure: Never   Smokeless tobacco: Never  Vaping Use   Vaping status: Never Used  Substance and Sexual Activity   Alcohol use: No   Drug use: No   Sexual activity: Not Currently  Other Topics Concern   Not on file  Social History Narrative   Retired Location manager. Married to Mrs. Jerie [place]. Daughter, Sharri Dee, lives in [place] . Son, Euel Herring, lives in [place] .   Social Drivers of Corporate investment banker Strain: Low Risk  (12/26/2021)   Overall Financial Resource Strain (CARDIA)    Difficulty of Paying Living Expenses: Not very hard  Food Insecurity: No Food Insecurity (10/29/2022)   Hunger Vital Sign    Worried About Running Out of Food in the Last Year: Never true    Ran Out of Food in the Last Year: Never true  Transportation Needs: No Transportation Needs (10/29/2022)   PRAPARE - Administrator, Civil Service (Medical): No    Lack of Transportation (Non-Medical): No  Physical Activity: Insufficiently Active (09/26/2021)   Exercise Vital Sign    Days of Exercise per Week: 7 days    Minutes of Exercise per Session: 10 min  Stress: No  Stress Concern Present (09/26/2021)   [employer] of Occupational Health - Occupational Stress Questionnaire    Feeling of Stress : Not at all  Social Connections: Not on file   Lives in a house. Smoking: denies Occupation: retired  Landscape architect HistorySurveyor, minerals in the house: no Engineer, civil (consulting) in the family room: yes Carpet in the bedroom: yes Heating: electric Cooling: central Pet: no  Family History: Family History  Problem Relation Age of Onset   Hypertension Mother    Heart disease Mother    Diabetes Mother    Diabetes Father    Hypertension Father    Cancer Father        lung cancer   Healthy Sister    Heart attack Brother    Heart disease Brother 48       + tobacco   Healthy Brother    Review of Systems  Constitutional:  Negative for appetite change, chills, fever and unexpected weight change.  HENT:  Negative for congestion and rhinorrhea.  Facial/lip swelling  Eyes:  Negative for itching.  Respiratory:  Negative for cough, chest tightness, shortness of breath and wheezing.   Cardiovascular:  Negative for chest pain.  Gastrointestinal:  Negative for abdominal pain.  Genitourinary:  Negative for difficulty urinating.  Skin:  Negative for rash.  Neurological:  Negative for headaches.    Objective: BP (!) 92/56 (BP Location: Right Arm, Patient Position: Sitting, Cuff Size: Normal)   Pulse (!) 41   Temp 97.7 F (36.5 C) (Temporal)   Resp 16   Ht 5' 5.75 (1.67 m)   Wt 163 lb 11.2 oz (74.3 kg)   SpO2 97%   BMI 26.62 kg/m  Body mass index is 26.62 kg/m. Physical Exam Vitals and nursing note reviewed.  Constitutional:      Appearance: Normal appearance. He is well-developed.  HENT:     Head: Normocephalic and atraumatic.     Right Ear: Tympanic membrane and external ear normal.     Left Ear: Tympanic membrane and external ear normal.     Nose: Nose normal.     Mouth/Throat:     Mouth: Mucous membranes are moist.     Pharynx:  Oropharynx is clear.   Eyes:     Conjunctiva/sclera: Conjunctivae normal.    Cardiovascular:     Rate and Rhythm: Normal rate and regular rhythm.     Heart sounds: Normal heart sounds. No murmur heard.    No friction rub. No gallop.  Pulmonary:     Effort: Pulmonary effort is normal.     Breath sounds: Normal breath sounds. No wheezing, rhonchi or rales.   Musculoskeletal:     Cervical back: Neck supple.   Skin:    General: Skin is warm.     Findings: No rash.   Neurological:     Mental Status: He is alert and oriented to person, place, and time.   Psychiatric:        Behavior: Behavior normal.   The plan was reviewed with the patient/family, and all questions/concerned were addressed.  It was my pleasure to see Trysten today and participate in his care. Please feel free to contact me with any questions or concerns.  Sincerely,  Eudelia Hero, DO Allergy & Immunology  [facility]   [place] office: [phone] [facility] office: [phone]

## 2023-07-31 ENCOUNTER — Other Ambulatory Visit

## 2023-07-31 ENCOUNTER — Other Ambulatory Visit (HOSPITAL_COMMUNITY): Payer: Self-pay

## 2023-07-31 ENCOUNTER — Ambulatory Visit (INDEPENDENT_AMBULATORY_CARE_PROVIDER_SITE_OTHER): Admitting: Allergy

## 2023-07-31 ENCOUNTER — Encounter: Payer: Self-pay | Admitting: Allergy

## 2023-07-31 ENCOUNTER — Other Ambulatory Visit: Payer: Self-pay

## 2023-07-31 VITALS — BP 92/56 | HR 41 | Temp 97.7°F | Resp 16 | Ht 65.75 in | Wt 163.7 lb

## 2023-07-31 DIAGNOSIS — T783XXD Angioneurotic edema, subsequent encounter: Secondary | ICD-10-CM | POA: Diagnosis not present

## 2023-07-31 DIAGNOSIS — Z0182 Encounter for allergy testing: Secondary | ICD-10-CM | POA: Diagnosis not present

## 2023-07-31 DIAGNOSIS — T7840XA Allergy, unspecified, initial encounter: Secondary | ICD-10-CM

## 2023-07-31 DIAGNOSIS — T783XXA Angioneurotic edema, initial encounter: Secondary | ICD-10-CM

## 2023-07-31 DIAGNOSIS — Z889 Allergy status to unspecified drugs, medicaments and biological substances status: Secondary | ICD-10-CM | POA: Diagnosis not present

## 2023-07-31 DIAGNOSIS — J3489 Other specified disorders of nose and nasal sinuses: Secondary | ICD-10-CM | POA: Diagnosis not present

## 2023-07-31 DIAGNOSIS — T7840XD Allergy, unspecified, subsequent encounter: Secondary | ICD-10-CM | POA: Diagnosis not present

## 2023-07-31 MED ORDER — IPRATROPIUM BROMIDE 0.03 % NA SOLN
1.0000 | Freq: Two times a day (BID) | NASAL | 2 refills | Status: AC | PRN
  Filled 2023-07-31: qty 30, 75d supply, fill #0
  Filled 2023-10-17: qty 30, 75d supply, fill #1

## 2023-07-31 MED ORDER — EPINEPHRINE 0.3 MG/0.3ML IJ SOAJ
0.3000 mg | INTRAMUSCULAR | 1 refills | Status: AC | PRN
  Filled 2023-07-31: qty 2, 2d supply, fill #0

## 2023-07-31 NOTE — Patient Instructions (Addendum)
 Angioedema may be isolated or associated with urticaria. It may be histamine-induced (i.e. foods or certain medications) or bradykinin-mediated (i.e. Hereditary Angioedema or ACE-I). Patients with histamine-induced angioedema are more likely to have associated symptoms of urticaria, pruritus or signs of anaphylaxis. Patients with bradykinin-mediated do not have any symptoms of pruritus or urticaria and they did not improve with Benadryl  or Epinephrine . ACE-inhibitors are notorious to cause angioedema and has an incidence of 0.1-0.7% and orolingual involvement is common. ACE is involved in the degradation of bradykinin and blocking it can cause increase in bradykinin resulting in angioedema. Avoid all ACE-inhibitors in the future. Try to avoid NSAIDs if possible as this class has been known to lower threshold for angioedema.   Please call your cardiologist regarding stopping ENTRESTO  (which contains an ace-inhibitor type of medications).   Get bloodwork to rule out any other systemic issues.  We are ordering labs, so please allow 1-2 weeks for the results to come back. With the newly implemented Cures Act, the labs might be visible to you at the same time that they become visible to me. However, I will not address the results until all of the results are back, so please be patient.  In the meantime, continue recommendations in your patient instructions, including avoidance measures (if applicable), until you hear from me.  I have prescribed epinephrine  injectable device and demonstrated proper use. For mild symptoms you can take over the counter antihistamines (zyrtec 10mg  to 20mg ) and monitor symptoms closely.  If symptoms worsen or if you have severe symptoms including breathing issues, throat closure, significant swelling, whole body hives, severe diarrhea and vomiting, lightheadedness then use epinephrine  and seek immediate medical care afterwards. Emergency action plan given.  Medication  allergies Avoid aspirin  and sulfa  Runny nose Use Atrovent (ipratropium) 0.03% 1-2 sprays per nostril twice a day as needed for runny nose/drainage.  Follow up in 4 weeks or sooner if needed.

## 2023-08-01 ENCOUNTER — Telehealth (HOSPITAL_COMMUNITY): Payer: Self-pay | Admitting: Cardiology

## 2023-08-01 LAB — PROTEIN ELECTROPHORESIS, URINE REFLEX

## 2023-08-01 NOTE — Telephone Encounter (Signed)
 Pts wife called to report he was advised to stop entresto  by allergy and asthma provide Dr Burdette Carolin   Reports allergic reaction 6/14 seen in ED for swelling (lips and cheek) Referred to Dr Burdette Carolin, seen 6/18  angioedema noted  Please advise

## 2023-08-01 NOTE — Telephone Encounter (Signed)
 Please discontinue entresto    I updated allergy list.   Shreena Baines NP-C 3:56 PM

## 2023-08-02 LAB — C3 AND C4: Complement C3, Serum: 191 mg/dL — ABNORMAL HIGH (ref 82–167)

## 2023-08-02 LAB — THYROID CASCADE PROFILE: TSH: 1.55 u[IU]/mL (ref 0.450–4.500)

## 2023-08-02 NOTE — Telephone Encounter (Signed)
Pt aware.

## 2023-08-07 ENCOUNTER — Ambulatory Visit: Payer: Self-pay | Admitting: Allergy

## 2023-08-07 ENCOUNTER — Other Ambulatory Visit (HOSPITAL_COMMUNITY): Payer: Self-pay

## 2023-08-07 ENCOUNTER — Other Ambulatory Visit (HOSPITAL_COMMUNITY): Payer: Self-pay | Admitting: Family Medicine

## 2023-08-07 ENCOUNTER — Encounter (HOSPITAL_COMMUNITY)

## 2023-08-07 DIAGNOSIS — I4891 Unspecified atrial fibrillation: Secondary | ICD-10-CM | POA: Diagnosis not present

## 2023-08-07 DIAGNOSIS — I5022 Chronic systolic (congestive) heart failure: Secondary | ICD-10-CM | POA: Diagnosis not present

## 2023-08-07 DIAGNOSIS — E1169 Type 2 diabetes mellitus with other specified complication: Secondary | ICD-10-CM | POA: Diagnosis not present

## 2023-08-07 DIAGNOSIS — I739 Peripheral vascular disease, unspecified: Secondary | ICD-10-CM | POA: Diagnosis not present

## 2023-08-07 LAB — PROTEIN ELECTROPHORESIS, URINE REFLEX
Albumin ELP, Urine: 63.7 %
Alpha-1-Globulin, U: 2.8 %
Alpha-2-Globulin, U: 7.9 %
Beta Globulin, U: 13.7 %
Gamma Globulin, U: 11.8 %
Protein, Ur: 26.8 mg/dL

## 2023-08-07 LAB — ALPHA-GAL PANEL: IgE (Immunoglobulin E), Serum: 207 [IU]/mL (ref 6–495)

## 2023-08-07 LAB — FOOD ALLERGY PROFILE

## 2023-08-07 LAB — PROTEIN ELECTROPHORESIS, SERUM
A/G Ratio: 0.9 (ref 0.7–1.7)
Albumin ELP: 3.4 g/dL (ref 2.9–4.4)
Alpha 1: 0.4 g/dL (ref 0.0–0.4)
Alpha 2: 1 g/dL (ref 0.4–1.0)
Beta: 1.2 g/dL (ref 0.7–1.3)
Gamma Globulin: 0.9 g/dL (ref 0.4–1.8)
Globulin, Total: 3.6 g/dL (ref 2.2–3.9)
Total Protein: 7 g/dL (ref 6.0–8.5)

## 2023-08-07 LAB — FANA STAINING PATTERNS: Speckled Pattern: 1:80 {titer}

## 2023-08-07 LAB — C3 AND C4: Complement C4, Serum: 38 mg/dL (ref 12–38)

## 2023-08-07 LAB — C-REACTIVE PROTEIN: CRP: 20 mg/L — ABNORMAL HIGH (ref 0–10)

## 2023-08-07 LAB — ANTINUCLEAR ANTIBODIES, IFA

## 2023-08-07 LAB — C1 ESTERASE INHIBITOR: C1INH SerPl-mCnc: 42 mg/dL — ABNORMAL HIGH (ref 21–39)

## 2023-08-07 LAB — COMPLEMENT COMPONENT C1Q: Complement C1Q: 14.8 mg/dL (ref 10.2–20.3)

## 2023-08-07 LAB — SEDIMENTATION RATE: Sed Rate: 44 mm/h — ABNORMAL HIGH (ref 0–30)

## 2023-08-07 LAB — TRYPTASE: Tryptase: 4.6 ug/L (ref 2.2–13.2)

## 2023-08-07 LAB — C1 ESTERASE INHIBITOR, FUNCTIONAL: C1INH Functional/C1INH Total MFr SerPl: >105 %{normal}

## 2023-08-08 ENCOUNTER — Other Ambulatory Visit: Payer: Self-pay

## 2023-08-12 ENCOUNTER — Other Ambulatory Visit (HOSPITAL_COMMUNITY): Payer: Self-pay

## 2023-08-12 ENCOUNTER — Other Ambulatory Visit: Payer: Self-pay

## 2023-08-12 DIAGNOSIS — E0851 Diabetes mellitus due to underlying condition with diabetic peripheral angiopathy without gangrene: Secondary | ICD-10-CM | POA: Diagnosis not present

## 2023-08-12 DIAGNOSIS — I4891 Unspecified atrial fibrillation: Secondary | ICD-10-CM | POA: Diagnosis not present

## 2023-08-12 DIAGNOSIS — I428 Other cardiomyopathies: Secondary | ICD-10-CM | POA: Diagnosis not present

## 2023-08-12 DIAGNOSIS — E1169 Type 2 diabetes mellitus with other specified complication: Secondary | ICD-10-CM | POA: Diagnosis not present

## 2023-08-12 DIAGNOSIS — I739 Peripheral vascular disease, unspecified: Secondary | ICD-10-CM | POA: Diagnosis not present

## 2023-08-12 DIAGNOSIS — I5022 Chronic systolic (congestive) heart failure: Secondary | ICD-10-CM | POA: Diagnosis not present

## 2023-08-12 DIAGNOSIS — C61 Malignant neoplasm of prostate: Secondary | ICD-10-CM | POA: Diagnosis not present

## 2023-08-12 MED ORDER — POTASSIUM CHLORIDE ER 10 MEQ PO TBCR
20.0000 meq | EXTENDED_RELEASE_TABLET | Freq: Every day | ORAL | 3 refills | Status: DC
  Filled 2023-08-12: qty 60, 30d supply, fill #0
  Filled 2023-09-06: qty 60, 30d supply, fill #1
  Filled 2023-10-07: qty 60, 30d supply, fill #2
  Filled 2023-11-05: qty 60, 30d supply, fill #3

## 2023-08-13 ENCOUNTER — Ambulatory Visit (HOSPITAL_COMMUNITY)
Admit: 2023-08-13 | Discharge: 2023-08-13 | Disposition: A | Discharge status: Home / Self Care | Source: Ambulatory Visit | Attending: Cardiology | Admitting: Cardiology

## 2023-08-13 ENCOUNTER — Other Ambulatory Visit (HOSPITAL_COMMUNITY): Payer: Self-pay

## 2023-08-13 ENCOUNTER — Inpatient Hospital Stay (HOSPITAL_COMMUNITY): Admit: 2023-08-13 | Admitting: Cardiology

## 2023-08-13 VITALS — BP 110/70 | HR 54 | Wt 161.0 lb

## 2023-08-13 DIAGNOSIS — Z8546 Personal history of malignant neoplasm of prostate: Secondary | ICD-10-CM | POA: Insufficient documentation

## 2023-08-13 DIAGNOSIS — I110 Hypertensive heart disease with heart failure: Secondary | ICD-10-CM | POA: Diagnosis not present

## 2023-08-13 DIAGNOSIS — I2510 Atherosclerotic heart disease of native coronary artery without angina pectoris: Secondary | ICD-10-CM | POA: Diagnosis not present

## 2023-08-13 DIAGNOSIS — I428 Other cardiomyopathies: Secondary | ICD-10-CM | POA: Insufficient documentation

## 2023-08-13 DIAGNOSIS — I4821 Permanent atrial fibrillation: Secondary | ICD-10-CM | POA: Insufficient documentation

## 2023-08-13 DIAGNOSIS — I493 Ventricular premature depolarization: Secondary | ICD-10-CM | POA: Diagnosis not present

## 2023-08-13 DIAGNOSIS — Z95810 Presence of automatic (implantable) cardiac defibrillator: Secondary | ICD-10-CM | POA: Diagnosis not present

## 2023-08-13 DIAGNOSIS — I447 Left bundle-branch block, unspecified: Secondary | ICD-10-CM | POA: Diagnosis not present

## 2023-08-13 DIAGNOSIS — Z85028 Personal history of other malignant neoplasm of stomach: Secondary | ICD-10-CM | POA: Insufficient documentation

## 2023-08-13 DIAGNOSIS — E119 Type 2 diabetes mellitus without complications: Secondary | ICD-10-CM | POA: Diagnosis not present

## 2023-08-13 DIAGNOSIS — Z7984 Long term (current) use of oral hypoglycemic drugs: Secondary | ICD-10-CM | POA: Diagnosis not present

## 2023-08-13 DIAGNOSIS — I5022 Chronic systolic (congestive) heart failure: Secondary | ICD-10-CM | POA: Insufficient documentation

## 2023-08-13 DIAGNOSIS — Z7901 Long term (current) use of anticoagulants: Secondary | ICD-10-CM | POA: Insufficient documentation

## 2023-08-13 MED ORDER — VALSARTAN 40 MG PO TABS
40.0000 mg | ORAL_TABLET | Freq: Two times a day (BID) | ORAL | 11 refills | Status: DC
  Filled 2023-08-13: qty 60, 30d supply, fill #0
  Filled 2023-09-06: qty 60, 30d supply, fill #1
  Filled 2023-09-23 – 2023-10-30 (×3): qty 60, 30d supply, fill #2
  Filled 2023-11-28: qty 60, 30d supply, fill #3
  Filled 2023-12-30: qty 60, 30d supply, fill #4

## 2023-08-13 NOTE — Patient Instructions (Signed)
 START Valsartan  40 mg one tab twice a day  Labs needed in 10-14 days  Please be sure to follow up with Dr Waddell (Cardiology EP) to discuss left bundle lead  Your physician recommends that you schedule a follow-up appointment in: 1 month  in the Advanced Practitioners (PA/NP) Clinic    Do the following things EVERYDAY: Weigh yourself in the morning before breakfast. Write it down and keep it in a log. Take your medicines as prescribed Eat low salt foods--Limit salt (sodium) to 2000 mg per day.  Stay as active as you can everyday Limit all fluids for the day to less than 2 liters  At the [facility], you and your health needs are our priority. As part of our continuing mission to provide you with exceptional heart care, we have created designated Provider Care Teams. These Care Teams include your primary Cardiologist (physician) and Advanced Practice Providers (APPs- Physician Assistants and Nurse Practitioners) who all work together to provide you with the care you need, when you need it.   You may see any of the following providers on your designated Care Team at your next follow up: Dr Toribio Fuel Dr Ezra Shuck Dr. Ria Commander Dr. Morene Brownie Amy Lenetta, NP Caffie Shed, [place] [facility] [place], [place] Beckey Coe, NP [place] Lee, NP Ellouise Class, NP Tinnie Redman, PharmD Jaun Bash, PharmD   Please be sure to bring in all your medications bottles to every appointment.    Thank you for choosing [facility]

## 2023-08-13 NOTE — Progress Notes (Addendum)
 ADVANCED HF CLINIC NOTE   Primary Care: [name], MD Primary Cardiologist: Aleene Passe, MD HF Cardiologist: Dr. Rolan  Chief complaint: CHF  HPI: Jon Williams is a 73 y.o. with a history of chronic systolic heart failure, St Jude ICD, permanent atrial fibrillation, CAD, DMII, LBBB, HTN, gastric cancer 2022, and prostate cancer 2019.    Cath 2018 showed mild to moderate non-obstructive CAD. Fick CI 1.9.    Echo 10/22 showed EF 20-25%, mod-severe Jon, mod TR, RV nl   Echo 2/23 showed EF 20-25%, mild Jon, RV not well visualized   Admitted 8/23 with HF. Diuresed with IV lasix  and GDMT adjusted. Echo showed EF 20-25%, RV mildly reduced, LA severely dilated, moderate-severe Jon.    Had follow up with Dr Lanny 09/2022. CT scan no evidence of recurrent cancer. Plan for repeat CT in 8 months.    Admitted 9/24 with CHF. Echo showed EF 10-15%, G3DD, RV mildly reduced. R/LHC showed nonobstructive CAD, normal filling pressures, and low cardiac index. Unable to get cMRI as device not compatible. GDMT titrated and he was discharged home, weight 183 lbs.  Echo 12/24 EF 20% with severe LV dilation, normal RV, severe LAE, moderate Jon, IVC normal.   RHC was done in 6/25 showing normal filling pressures but low cardiac output.   Seen in the ER in 6/25 with angioedema, followed up with allergist and was taken off Entresto .   He recently wore a Zio monitor to quantify PVCs but monitor has not been processed yet.   Today he returns for HF follow up with wife. He notes dyspnea after walking around the grocery store or farmer's market.  Gets tired easily with activities outside the house.  No dyspnea walking around the house or with usual ADLs.  No lightheadedness.  No chest pain.  No orthopnea/PND. Occasional bouts of nausea without emesis.  Has back pain from spinal stenosis.  Weight down 3 lbs.  BP and HR are higher, but otherwise does not think that he feels worse off Entresto .   ECG (personally  reviewed): Atrial fibrillation, periodic RV pacing, PVCs, LBBB with QRS 151 msec  St Jude device interrogation (personally reviewed): 62% RV pacing, 100% AF, stable thoracic impedance.   Labs (9/24): K 3.7, creatinine 1.13, Lp(a) 184 Labs (10/24): K 4.3, creatinine 1.27, hgb 13.6 Labs (1/25): 4.1, creatinine 1.26 Labs (2/25): K 4, creatinine 1.21, hgb 11.2, LFTs normal Labs (6/25): K 4.4, creatinine 1.4  PMH: 1. H/o gastric cancer.  2. H/o prostate cancer.  3. Type 2 DM 4. Atrial fibrillation: Permanent 5. Chronic systolic CHF: Nonischemic cardiomyopathy. St Jude ICD, unable to find adequate coronary vein for LV lead.   - R/LHC (2018): Mild to moderate non obstructive CAD; RA 8, PA 42/20 (29), PCWP 17, CO/CI (Fick) 3.76/ 1.9  - Echo (10/22): EF 20-25%, mod-severe Jon, mod TR, RV normal - Echo (2/23): EF 20-25%, mild Jon, RV not well visualized - Echo (8/24): EF 20-25%, RV mildly reduced, LA severely dilated, moderate to severe Jon - R/LHC (9/24): nonobstructive CAD; RA 1, PA 28/10 (17), PCWP 8, Fick CI 2.16, thermo CI 1.84.  - Echo (9/24): EF 10-15%, severe LV dilation, mild RV dysfunction with moderate RV enlargement, moderate-severe Jon.  - Echo (12/24): EF 20% with severe LV dilation, normal RV, severe LAE, moderate Jon, IVC normal.  - RHC (6/25): mean RA 4, PA 29/12 mean 19, mean PCWP 11, CI 2.2 Fick/1.87 thermo, PAPi 4.25 - Suspect Entresto -related angioedema.  6. Fe deficiency  anemia 7. Spinal stenosis  FH: mother CVA, MI  Review of Systems: All systems reviewed and negative except as per HPI.   Current Outpatient Medications  Medication Sig Dispense Refill   Accu-Chek Softclix Lancets lancets Use to check your blood sugar finger stick once a day Dx Code E11.9 90 days 100 each 3   atorvastatin  (LIPITOR) 40 MG tablet Take 1 tablet (40 mg total) by mouth daily. 90 tablet 3   Biotin 1000 MCG tablet Take 1,000 mcg by mouth daily with breakfast.     Blood Glucose Monitoring Suppl  (ACCU-CHEK GUIDE) w/Device KIT as directed to check blood sugar once a day 1 kit 0   cholecalciferol  (VITAMIN D3) 25 MCG (1000 UT) tablet Take 1,000 Units by mouth daily with breakfast.     digoxin  (LANOXIN ) 0.125 MG tablet Take 1/2 tablet (0.0625mg  total) by mouth daily. 15 tablet 6   empagliflozin  (JARDIANCE ) 10 MG TABS tablet Take 1 tablet (10 mg total) by mouth daily. 90 tablet 3   EPINEPHrine  0.3 mg/0.3 mL IJ SOAJ injection Inject 0.3 mg into the muscle as needed for anaphylaxis. 2 each 1   famotidine  (PEPCID ) 20 MG tablet Take 1 tablet (20 mg total) by mouth once a day at bedtime as needed. 30 tablet 3   ferrous sulfate 325 (65 FE) MG tablet Take 325 mg by mouth daily with breakfast.     furosemide  (LASIX ) 40 MG tablet Take 1 tablet (40 mg total) by mouth daily. 30 tablet 6   gabapentin  (NEURONTIN ) 100 MG capsule Take 1 capsule (100 mg total) by mouth 2 (two) times daily. 180 capsule 1   glucose blood (ACCU-CHEK GUIDE TEST) test strip Use to check blood sugars once daily. 100 each 3   ipratropium (ATROVENT ) 0.03 % nasal spray Place 1-2 sprays into both nostrils 2 (two) times daily as needed (nasal drainage). 30 mL 2   ketoconazole  (NIZORAL ) 2 % cream Apply 1 Application topically 2 (two) times daily. 30 g 3   lidocaine -prilocaine  (EMLA ) cream Apply 1 Application topically as needed. 30 g 0   loratadine  (CLARITIN ) 10 MG tablet Take 10 mg by mouth daily.     metFORMIN  (GLUCOPHAGE -XR) 500 MG 24 hr tablet Take 1 tablet (500 mg total) by mouth with evening meal 90 tablet 3   metoprolol  succinate (TOPROL -XL) 25 MG 24 hr tablet Take 1 tablet (25 mg total) by mouth daily. 90 tablet 3   multivitamin (ONE-A-DAY MEN'S) TABS tablet Take 1 tablet by mouth daily with breakfast.     Naftifine HCl (NAFTIN) 2 % GEL Apply 1 application  topically every Monday, Wednesday, and Friday.     pantoprazole  (PROTONIX ) 40 MG tablet Take 1 tablet (40 mg total) by mouth 2 (two) times daily. 180 tablet 3   potassium  chloride (KLOR-CON ) 10 MEQ tablet Take 2 tablets (20 mEq total) by mouth daily. 60 tablet 3   rivaroxaban  (XARELTO ) 20 MG TABS tablet Take 1 tablet (20 mg total) by mouth daily with supper. 90 tablet 3   spironolactone  (ALDACTONE ) 25 MG tablet Take 1 tablet (25 mg total) by mouth daily. 90 tablet 3   Tavaborole 5 % SOLN Apply 1 application  topically daily as needed (Nail on left hand).     valsartan  (DIOVAN ) 40 MG tablet Take 1 tablet (40 mg total) by mouth 2 (two) times daily. 60 tablet 11   No current facility-administered medications for this encounter.   Allergies  Allergen Reactions   Aspirin  Anaphylaxis and Hives  Entresto  [Sacubitril -Valsartan ] Anaphylaxis   Sulfa Antibiotics Anaphylaxis, Hives, Swelling and Other (See Comments)    Swollen lips   Ace Inhibitors     Possible angioedema   Social History   Socioeconomic History   Marital status: Married    Spouse name: Not on file   Number of children: 2   Years of education: Not on file   Highest education level: Not on file  Occupational History   Occupation: retired  Tobacco Use   Smoking status: Never    Passive exposure: Never   Smokeless tobacco: Never  Vaping Use   Vaping status: Never Used  Substance and Sexual Activity   Alcohol use: No   Drug use: No   Sexual activity: Not Currently  Other Topics Concern   Not on file  Social History Narrative   Retired Location manager. Married to Mrs. Carollee LOISE Budge. Daughter, Marko, lives in [place] . Son, Stark, lives in [place] .   Social Drivers of Corporate investment banker Strain: Low Risk  (12/26/2021)   Overall Financial Resource Strain (CARDIA)    Difficulty of Paying Living Expenses: Not very hard  Food Insecurity: No Food Insecurity (10/29/2022)   Hunger Vital Sign    Worried About Running Out of Food in the Last Year: Never true    Ran Out of Food in the Last Year: Never true  Transportation Needs: No Transportation Needs (10/29/2022)   PRAPARE -  Administrator, Civil Service (Medical): No    Lack of Transportation (Non-Medical): No  Physical Activity: Insufficiently Active (09/26/2021)   Exercise Vital Sign    Days of Exercise per Week: 7 days    Minutes of Exercise per Session: 10 min  Stress: No Stress Concern Present (09/26/2021)   [employer] of Occupational Health - Occupational Stress Questionnaire    Feeling of Stress : Not at all  Social Connections: Not on file  Intimate Partner Violence: Not At Risk (10/29/2022)   Humiliation, Afraid, Rape, and Kick questionnaire    Fear of Current or Ex-Partner: No    Emotionally Abused: No    Physically Abused: No    Sexually Abused: No   BP 110/70   Pulse (!) 54   Wt 73 kg (161 lb)   SpO2 97%   BMI 26.19 kg/m   Wt Readings from Last 3 Encounters:  08/13/23 73 kg (161 lb)  07/31/23 74.3 kg (163 lb 11.2 oz)  07/27/23 74.8 kg (164 lb 14.5 oz)   PHYSICAL EXAM: General: NAD Neck: Dilated EJs, no thyromegaly or thyroid  nodule.  Lungs: Clear to auscultation bilaterally with normal respiratory effort. CV: Nondisplaced PMI.  Heart regular S1/S2, no S3/S4, no murmur.  No peripheral edema.  No carotid bruit.  Normal pedal pulses.  Abdomen: Soft, nontender, no hepatosplenomegaly, no distention.  Skin: Intact without lesions or rashes.  Neurologic: Alert and oriented x 3.  Psych: Normal affect. Extremities: No clubbing or cyanosis.  HEENT: Normal.   ASSESSMENT & PLAN: 1. Chronic systolic CHF: Long-standing cardiomyopathy, nonischemic.  St Jude ICD, was unable to find adequate vein for LV lead. Device is not MRI compatible. Cath in 2018 with nonobstructive CAD but CI at that time was low at 1.9.  Echo in 8/23 showed EF 20-25%, mild RV dysfunction, moderate-severe Jon.  Admitted 9/24 with CHF. Echo showed EF 10-15%, G3DD, RV mildly reduced. R/LHC showed non-obstructive CAD, normal filling pressures and low CI (2.16 Fick/1.84 thermo). Echo 12/24 showed EF 20% with severe  LV  dilation, normal RV, severe LAE, moderate Jon, IVC normal. Repeat RHC in 6/25 again showed low CI (2.2 F/1.87 T) with normal filling pressures. He had to stop Entresto  with angioedema.  He is not volume overloaded by exam or [place].  NYHA class III symptoms, somewhat progressive.  - Based on low output on prior RHCs and markedly low EF, he would be a reasonable LVAD candidate.  We have discussed this extensively.  He is very clear that he does not want a cardiac surgery, and I think that he understands the situation and his future risk well.   - Patient is RV pacing 62% of the time and also has a LBBB around 150 msec at baseline as well as frequent PVCs.  Unable to place CS lead in the past.  I am going to work on getting an appointment for him with Dr. Waddell for left bundle pacing for resynchronization (may also help suppress PVCs).  - Continue digoxin . Check level with next labs.   - Continue Lasix  40 mg daily.  - Off Entresto  with angioedema, will not be candidate for ACEI or Entresto .  There should be relatively minimal risk of angioedema with ARB.  I will try him on valsartan  40 mg bid with BMET/BNP in 10 days.   - Continue spironolactone  25 mg daily. - Continue Jardiance  10 mg daily - Continue Toprol  XL 25 mg daily. 2. Atrial fibrillation: This is permanent.   - Continue Xarelto .   3. Gastric cancer: Treated, now being monitored.  4. Type 2 diabetes: Per PCP.  - Continue Jardiance . 5. OSA: Suspected. He declines sleep study. 6. PVCs: Frequent PVCs have been noted.  Wore Zio monitor, waiting for it to be processed and returned.  - Would not increase Toprol  XL with low output.  - Could use amiodarone if high percentage, but wonder if left bundle lead and pacing could suppress.   Followup 1 month with APP.  Needs appt with EP to discuss left bundle lead placement.   I spent 32 minutes reviewing records, interviewing/examining patient, and managing orders.     Ezra Shuck  08/13/23

## 2023-08-13 NOTE — Addendum Note (Signed)
 Encounter addended by: Rolan Ezra RAMAN, MD on: 08/13/2023 2:37 PM  Actions taken: Clinical Note Signed

## 2023-08-14 ENCOUNTER — Other Ambulatory Visit (HOSPITAL_COMMUNITY): Payer: Self-pay

## 2023-08-17 DIAGNOSIS — J988 Other specified respiratory disorders: Secondary | ICD-10-CM | POA: Diagnosis not present

## 2023-08-17 DIAGNOSIS — B370 Candidal stomatitis: Secondary | ICD-10-CM | POA: Diagnosis not present

## 2023-08-19 ENCOUNTER — Other Ambulatory Visit: Payer: Self-pay

## 2023-08-19 ENCOUNTER — Emergency Department (HOSPITAL_COMMUNITY)
Admission: EM | Admit: 2023-08-19 | Discharge: 2023-08-19 | Disposition: A | Discharge status: Home / Self Care | Attending: Emergency Medicine | Admitting: Emergency Medicine

## 2023-08-19 ENCOUNTER — Encounter (HOSPITAL_COMMUNITY): Payer: Self-pay

## 2023-08-19 ENCOUNTER — Emergency Department (HOSPITAL_COMMUNITY)

## 2023-08-19 DIAGNOSIS — Z8546 Personal history of malignant neoplasm of prostate: Secondary | ICD-10-CM | POA: Diagnosis not present

## 2023-08-19 DIAGNOSIS — Z7984 Long term (current) use of oral hypoglycemic drugs: Secondary | ICD-10-CM | POA: Diagnosis not present

## 2023-08-19 DIAGNOSIS — Z79899 Other long term (current) drug therapy: Secondary | ICD-10-CM | POA: Insufficient documentation

## 2023-08-19 DIAGNOSIS — R1031 Right lower quadrant pain: Secondary | ICD-10-CM | POA: Insufficient documentation

## 2023-08-19 DIAGNOSIS — K5790 Diverticulosis of intestine, part unspecified, without perforation or abscess without bleeding: Secondary | ICD-10-CM | POA: Diagnosis not present

## 2023-08-19 DIAGNOSIS — I2510 Atherosclerotic heart disease of native coronary artery without angina pectoris: Secondary | ICD-10-CM | POA: Diagnosis not present

## 2023-08-19 DIAGNOSIS — Z85028 Personal history of other malignant neoplasm of stomach: Secondary | ICD-10-CM | POA: Diagnosis not present

## 2023-08-19 DIAGNOSIS — E871 Hypo-osmolality and hyponatremia: Secondary | ICD-10-CM | POA: Insufficient documentation

## 2023-08-19 DIAGNOSIS — K429 Umbilical hernia without obstruction or gangrene: Secondary | ICD-10-CM | POA: Diagnosis not present

## 2023-08-19 DIAGNOSIS — E119 Type 2 diabetes mellitus without complications: Secondary | ICD-10-CM | POA: Diagnosis not present

## 2023-08-19 DIAGNOSIS — I5042 Chronic combined systolic (congestive) and diastolic (congestive) heart failure: Secondary | ICD-10-CM | POA: Insufficient documentation

## 2023-08-19 DIAGNOSIS — N1330 Unspecified hydronephrosis: Secondary | ICD-10-CM | POA: Diagnosis not present

## 2023-08-19 DIAGNOSIS — Z7901 Long term (current) use of anticoagulants: Secondary | ICD-10-CM | POA: Insufficient documentation

## 2023-08-19 DIAGNOSIS — N134 Hydroureter: Secondary | ICD-10-CM | POA: Diagnosis not present

## 2023-08-19 DIAGNOSIS — I110 Hypertensive heart disease with heart failure: Secondary | ICD-10-CM | POA: Diagnosis not present

## 2023-08-19 DIAGNOSIS — C61 Malignant neoplasm of prostate: Secondary | ICD-10-CM

## 2023-08-19 DIAGNOSIS — R109 Unspecified abdominal pain: Secondary | ICD-10-CM

## 2023-08-19 LAB — CBC
HCT: 34.8 % — ABNORMAL LOW (ref 39.0–52.0)
Hemoglobin: 10.6 g/dL — ABNORMAL LOW (ref 13.0–17.0)
MCH: 29.3 pg (ref 26.0–34.0)
MCHC: 30.5 g/dL (ref 30.0–36.0)
MCV: 96.1 fL (ref 80.0–100.0)
Platelets: 346 K/uL (ref 150–400)
RBC: 3.62 MIL/uL — ABNORMAL LOW (ref 4.22–5.81)
RDW: 14.6 % (ref 11.5–15.5)
WBC: 7.2 K/uL (ref 4.0–10.5)
nRBC: 0 % (ref 0.0–0.2)

## 2023-08-19 LAB — COMPREHENSIVE METABOLIC PANEL WITH GFR
ALT: 17 U/L (ref 0–44)
AST: 18 U/L (ref 15–41)
Albumin: 3.7 g/dL (ref 3.5–5.0)
Alkaline Phosphatase: 44 U/L (ref 38–126)
Anion gap: 12 (ref 5–15)
BUN: 21 mg/dL (ref 8–23)
CO2: 22 mmol/L (ref 22–32)
Calcium: 9.1 mg/dL (ref 8.9–10.3)
Chloride: 100 mmol/L (ref 98–111)
Creatinine, Ser: 1.24 mg/dL (ref 0.61–1.24)
GFR, Estimated: >60 mL/min (ref >60)
Glucose, Bld: 122 mg/dL — ABNORMAL HIGH (ref 70–99)
Potassium: 4.4 mmol/L (ref 3.5–5.1)
Sodium: 134 mmol/L — ABNORMAL LOW (ref 135–145)
Total Bilirubin: 0.5 mg/dL (ref 0.0–1.2)
Total Protein: 7.2 g/dL (ref 6.5–8.1)

## 2023-08-19 LAB — URINALYSIS, ROUTINE W REFLEX MICROSCOPIC
Bilirubin Urine: NEGATIVE
Glucose, UA: >=500 mg/dL — AB
Hgb urine dipstick: NEGATIVE
Ketones, ur: NEGATIVE mg/dL
Leukocytes,Ua: NEGATIVE
Nitrite: NEGATIVE
Protein, ur: NEGATIVE mg/dL
Specific Gravity, Urine: 1.011 (ref 1.005–1.030)
pH: 5 (ref 5.0–8.0)

## 2023-08-19 LAB — LIPASE, BLOOD: Lipase: 44 U/L (ref 11–51)

## 2023-08-19 MED ORDER — SODIUM CHLORIDE 0.9 % IV BOLUS
1000.0000 mL | Freq: Once | INTRAVENOUS | Status: AC
  Administered 2023-08-19: 1000 mL via INTRAVENOUS

## 2023-08-19 MED ORDER — IOHEXOL 350 MG/ML SOLN
75.0000 mL | Freq: Once | INTRAVENOUS | Status: AC | PRN
  Administered 2023-08-19: 75 mL via INTRAVENOUS

## 2023-08-19 MED ORDER — PANTOPRAZOLE SODIUM 40 MG IV SOLR
40.0000 mg | Freq: Once | INTRAVENOUS | Status: AC
  Administered 2023-08-19: 40 mg via INTRAVENOUS
  Filled 2023-08-19: qty 10

## 2023-08-19 MED ORDER — SUCRALFATE 1 G PO TABS
1.0000 g | ORAL_TABLET | Freq: Three times a day (TID) | ORAL | 0 refills | Status: DC
  Filled 2023-08-19: qty 21, 7d supply, fill #0

## 2023-08-19 MED ORDER — HYDROMORPHONE HCL 1 MG/ML IJ SOLN
1.0000 mg | Freq: Once | INTRAMUSCULAR | Status: AC
  Administered 2023-08-19: 1 mg via INTRAVENOUS
  Filled 2023-08-19: qty 1

## 2023-08-19 MED ORDER — OXYCODONE-ACETAMINOPHEN 5-325 MG PO TABS
1.0000 | ORAL_TABLET | Freq: Once | ORAL | Status: AC
  Administered 2023-08-19: 1 via ORAL
  Filled 2023-08-19: qty 1

## 2023-08-19 MED ORDER — HYDROCODONE-ACETAMINOPHEN 5-325 MG PO TABS
1.0000 | ORAL_TABLET | ORAL | 0 refills | Status: DC | PRN
  Filled 2023-08-19: qty 5, 1d supply, fill #0

## 2023-08-19 MED ORDER — ONDANSETRON HCL 4 MG/2ML IJ SOLN
4.0000 mg | Freq: Once | INTRAMUSCULAR | Status: AC
  Administered 2023-08-19: 4 mg via INTRAVENOUS
  Filled 2023-08-19: qty 2

## 2023-08-19 NOTE — ED Triage Notes (Signed)
 Pt to er, pt states that he is here for some abd pain off and on for the past three days, states that he also has some back pain radiating up into his neck. Pt states that his pain is a 10/10, states that the abd pain is a burning pain.

## 2023-08-19 NOTE — ED Provider Notes (Signed)
 [place] EMERGENCY DEPARTMENT AT [facility] Provider Note  CSN: [id number] Arrival date & time: 08/19/23 1416  Chief Complaint(s) Abdominal Pain  HPI MICHEIL KLAUS is Williams 73 y.o. male with past medical history as below, significant for AICD, DM, CAD, nonischemic cardiomyopathy, gastric cancer who presents to the ED with complaint of abdominal pain  Patient reports ongoing abdominal pain over many weeks..  Feels the pain is worsened over the past 12 hours.  He ate breakfast this morning and began having worsening pain, primarily right lower quadrant burning sensation.  Nausea without vomiting.  No fevers or chills.  No change in bowel or bladder function.  No melena or BRBPR.  Follows with Dr. Rollin gastroenterology given history of gastric cancer  Past Medical History Past Medical History:  Diagnosis Date   AICD (automatic cardioverter/defibrillator) present 05/25/2016   biv icd   Angio-edema    Bunion, right    Chronic combined systolic and diastolic CHF (congestive heart failure) (HCC)    Echo 1/18: Mild conc LVH, EF 15-20, severe diff HK, inf and inf-septal AK, Gr 3 DD, mild to mod MR, severe LAE, mod reduced RVSF, mod RAE, mild TR, PASP 50   Coronary artery disease involving native coronary artery without angina pectoris 04/17/2016   LHC 1/18: pLCx 30, mLCx 20, mRCA 40, dRCA 20, LVEDP 23, mean RA 8, PA 42/20, PCWP 17   Diabetes mellitus without complication (HCC)    Gastric cancer (HCC)    History of atrial fibrillation    History of atrial flutter    History of cardiomegaly 06/07/2016   Noted on CXR   History of colon polyps 06/28/2017   Noted on colonoscopy   Hypertension    LBBB (left bundle branch block)    Nausea vomiting and diarrhea 04/04/2021   NICM (nonischemic cardiomyopathy) (HCC)    Echo 1/18:  Mild conc LVH, EF 15-20, severe diff HK, inf and inf-septal AK, Gr 3 DD, mild to mod MR, severe LAE, mod reduced RVSF, mod RAE, mild TR, PASP 50   Other  secondary pulmonary hypertension (HCC) 04/17/2016   Prostate cancer (HCC) 2019   Sigmoid diverticulosis 06/28/2017   Noted on colonoscopy   Patient Active Problem List   Diagnosis Date Noted   Acute on chronic systolic (congestive) heart failure (HCC) 10/29/2022   Acute on chronic systolic heart failure (HCC) 10/29/2022   Obesity (BMI 30-39.9) 12/25/2021   Community acquired pneumonia of right lower lobe of lung 12/25/2021   Malnutrition of moderate degree 09/15/2021   DM (diabetes mellitus), type 2 (HCC) 09/14/2021   Essential hypertension 09/13/2021   Dyslipidemia 09/13/2021   DNR (do not resuscitate) 09/13/2021   Port-Williams-Cath in place 06/15/2021   Hypotension due to hypovolemia 04/04/2021   Nausea vomiting and diarrhea 04/04/2021   Thrombocytopenia (HCC) 04/04/2021   Leukocytosis 04/04/2021   Acute renal failure superimposed on stage 3a chronic kidney disease (HCC) 04/04/2021   Diarrhea 04/01/2021   Occult blood in stools 04/01/2021   Foot ulcer, right (HCC) 04/01/2021   Hypotension 04/01/2021   Dehydration 04/01/2021   Iron  deficiency anemia 12/12/2020   Gastric cancer (HCC) 12/08/2020   Symptomatic anemia 11/30/2020   Gastrointestinal hemorrhage    Corns and callosities 08/07/2019   Blood clotting disorder (HCC) 05/06/2019   Coagulation defect (HCC) 05/06/2019   Pacemaker lead malfunction 10/10/2018   Pacemaker complications 10/10/2018   Pacemaker lead failure, sequela 10/10/2018   S/P knee replacement 08/16/2017   S/P total knee arthroplasty 08/16/2017  Malignant neoplasm of prostate (HCC) 04/02/2017   ICD (implantable cardioverter-defibrillator) in place    Persistent atrial fibrillation (HCC) 05/25/2016   Coronary artery disease involving native coronary artery without angina pectoris 04/17/2016   Other secondary pulmonary hypertension (HCC) 04/17/2016   Acute on chronic combined systolic and diastolic CHF (congestive heart failure) (HCC)    NICM (nonischemic  cardiomyopathy) (HCC)    LBBB (left bundle branch block)    Home Medication(s) Prior to Admission medications   Medication Sig Start Date End Date Taking? Authorizing Provider  HYDROcodone -acetaminophen  (NORCO/VICODIN) 5-325 MG tablet Take 1 tablet by mouth every 4 (four) hours as needed. 08/19/23  Yes Jon Savant A, DO  sucralfate  (CARAFATE ) 1 g tablet Take 1 tablet (1 g total) by mouth with breakfast, with lunch, and with evening meal for 7 days. 08/19/23 08/26/23 Yes Jon Savant LABOR, DO  Accu-Chek Softclix Lancets lancets Use to check your blood sugar finger stick once Williams day Dx Code E11.9 90 days 05/27/23     atorvastatin  (LIPITOR) 40 MG tablet Take 1 tablet (40 mg total) by mouth daily. 05/20/23   Glena Harlene HERO, FNP  Biotin 1000 MCG tablet Take 1,000 mcg by mouth daily with breakfast.    [provider]  Blood Glucose Monitoring Suppl (ACCU-CHEK GUIDE) w/Device KIT as directed to check blood sugar once Williams day 05/27/23     cholecalciferol  (VITAMIN D3) 25 MCG (1000 UT) tablet Take 1,000 Units by mouth daily with breakfast.    [provider]  digoxin  (LANOXIN ) 0.125 MG tablet Take 1/2 tablet (0.0625mg  total) by mouth daily. 03/15/23   Milford, Harlene HERO, FNP  empagliflozin  (JARDIANCE ) 10 MG TABS tablet Take 1 tablet (10 mg total) by mouth daily. 11/16/22     EPINEPHrine  0.3 mg/0.3 mL IJ SOAJ injection Inject 0.3 mg into the muscle as needed for anaphylaxis. 07/31/23   [name] Orlan HERO, DO  famotidine  (PEPCID ) 20 MG tablet Take 1 tablet (20 mg total) by mouth once Williams day at bedtime as needed. 07/05/23     ferrous sulfate 325 (65 FE) MG tablet Take 325 mg by mouth daily with breakfast.    [provider]  furosemide  (LASIX ) 40 MG tablet Take 1 tablet (40 mg total) by mouth daily. 11/12/22   Milford, Harlene HERO, FNP  gabapentin  (NEURONTIN ) 100 MG capsule Take 1 capsule (100 mg total) by mouth 2 (two) times daily. 03/25/23   Burton, Lacie K, NP  glucose blood (ACCU-CHEK GUIDE TEST) test  strip Use to check blood sugars once daily. 05/27/23   [name], MD  ipratropium (ATROVENT ) 0.03 % nasal spray Place 1-2 sprays into both nostrils 2 (two) times daily as needed (nasal drainage). 07/31/23   [name] Orlan HERO, DO  ketoconazole  (NIZORAL ) 2 % cream Apply 1 Application topically 2 (two) times daily. 09/26/22     lidocaine -prilocaine  (EMLA ) cream Apply 1 Application topically as needed. 04/03/23   Lanny Callander, MD  loratadine  (CLARITIN ) 10 MG tablet Take 10 mg by mouth daily.    [provider]  metFORMIN  (GLUCOPHAGE -XR) 500 MG 24 hr tablet Take 1 tablet (500 mg total) by mouth with evening meal 09/21/22     metoprolol  succinate (TOPROL -XL) 25 MG 24 hr tablet Take 1 tablet (25 mg total) by mouth daily. 03/15/23   Milford, Harlene HERO, FNP  multivitamin (ONE-Williams-DAY MEN'S) TABS tablet Take 1 tablet by mouth daily with breakfast.    [provider]  Naftifine HCl (NAFTIN) 2 % GEL Apply 1 application  topically every Monday, Wednesday, and Friday.    [provider]  pantoprazole  (PROTONIX ) 40 MG tablet Take 1 tablet (40 mg total) by mouth 2 (two) times daily. 01/04/23     potassium chloride  (KLOR-CON ) 10 MEQ tablet Take 2 tablets (20 mEq total) by mouth daily. 08/12/23   Milford, Harlene HERO, FNP  rivaroxaban  (XARELTO ) 20 MG TABS tablet Take 1 tablet (20 mg total) by mouth daily with supper. 03/15/23   Glena Harlene HERO, FNP  spironolactone  (ALDACTONE ) 25 MG tablet Take 1 tablet (25 mg total) by mouth daily. 03/15/23   Milford, Harlene HERO, FNP  Tavaborole 5 % SOLN Apply 1 application  topically daily as needed (Nail on left hand).    [provider]  valsartan  (DIOVAN ) 40 MG tablet Take 1 tablet (40 mg total) by mouth 2 (two) times daily. 08/13/23 08/12/24  Jon Ezra RAMAN, MD  prochlorperazine  (COMPAZINE ) 10 MG tablet Take 1 tablet (10 mg total) by mouth every 6 (six) hours as needed (Nausea or vomiting). 02/15/21 05/12/21  Lanny Callander, MD                                                                                                                                     Past Surgical History Past Surgical History:  Procedure Laterality Date   BIOPSY  12/01/2020   Procedure: BIOPSY;  Surgeon: Rollin Dover, MD;  Location: [facility] ENDOSCOPY;  Service: Endoscopy;;   BIOPSY  06/09/2021   Procedure: BIOPSY;  Surgeon: Rollin Dover, MD;  Location: [facility] ENDOSCOPY;  Service: Gastroenterology;;   BIOPSY  06/29/2022   Procedure: BIOPSY;  Surgeon: Rollin Dover, MD;  Location: [facility] ENDOSCOPY;  Service: Gastroenterology;;   BIV ICD INSERTION CRT-D N/Williams 05/25/2016   Procedure: BiV ICD Insertion CRT-D;  Surgeon: Danelle LELON Birmingham, MD;  Location: [facility] INVASIVE CV LAB;  Service: Cardiovascular;  Laterality: N/Williams;   CARDIAC CATHETERIZATION N/Williams 03/02/2016   Procedure: Right/Left Heart Cath and Coronary Angiography;  Surgeon: Lonni Hanson, MD;  Location: [facility] INVASIVE CV LAB;  Service: Cardiovascular;  Laterality: N/Williams;   CARDIOVERSION N/Williams 07/17/2016   Procedure: Cardioversion;  Surgeon: Birmingham Danelle LELON, MD;  Location: [facility] INVASIVE CV LAB;  Service: Cardiovascular;  Laterality: N/Williams;   COLONOSCOPY WITH PROPOFOL  N/Williams 06/28/2017   Procedure: COLONOSCOPY WITH PROPOFOL ;  Surgeon: Rollin Dover, MD;  Location: [facility] ENDOSCOPY;  Service: Endoscopy;  Laterality: N/Williams;   colonscopy  2009   ESOPHAGOGASTRODUODENOSCOPY N/Williams 12/01/2020   Procedure: ESOPHAGOGASTRODUODENOSCOPY (EGD);  Surgeon: Rollin Dover, MD;  Location: [facility] ENDOSCOPY;  Service: Endoscopy;  Laterality: N/Williams;  IDA/guaiac positive stools   ESOPHAGOGASTRODUODENOSCOPY (EGD) WITH PROPOFOL  N/Williams 12/16/2020   Procedure: ESOPHAGOGASTRODUODENOSCOPY (EGD) WITH PROPOFOL ;  Surgeon: Rollin Dover, MD;  Location: [facility] ENDOSCOPY;  Service: Endoscopy;  Laterality: N/Williams;   ESOPHAGOGASTRODUODENOSCOPY (EGD) WITH PROPOFOL  N/Williams 06/09/2021   Procedure: ESOPHAGOGASTRODUODENOSCOPY (EGD) WITH PROPOFOL ;  Surgeon: Rollin Dover, MD;  Location: [facility] ENDOSCOPY;  Service: Gastroenterology;  Laterality: N/Williams;  ESOPHAGOGASTRODUODENOSCOPY (EGD) WITH PROPOFOL  N/Williams 06/29/2022   Procedure: ESOPHAGOGASTRODUODENOSCOPY (EGD) WITH PROPOFOL ;  Surgeon: Rollin Dover, MD;  Location: [facility] ENDOSCOPY;  Service: Gastroenterology;  Laterality: N/Williams;   GOLD SEED IMPLANT N/Williams 12/24/2017   Procedure: GOLD SEED IMPLANT, TRANSERINEAL;  Surgeon: Nieves Cough, MD;  Location: [facility] ORS;  Service: Urology;  Laterality: N/Williams;   INSERT / REPLACE / REMOVE PACEMAKER     LEAD REVISION  10/10/2018   LEAD REVISION/REPAIR N/Williams 10/10/2018   Procedure: LEAD REVISION/REPAIR;  Surgeon: Waddell Danelle ORN, MD;  Location: [facility] INVASIVE CV LAB;  Service: Cardiovascular;  Laterality: N/Williams;   POLYPECTOMY  06/28/2017   Procedure: POLYPECTOMY;  Surgeon: Rollin Dover, MD;  Location: [facility] ENDOSCOPY;  Service: Endoscopy;;  ascending and descending colon polyp   PORTACATH PLACEMENT Right 12/23/2020   Procedure: INSERTION PORT-Williams-CATH;  Surgeon: Dasie Leonor CROME, MD;  Location: [facility] OR;  Service: General;  Laterality: Right;   PROSTATE BIOPSY  02/20/2017   RIGHT HEART CATH N/Williams 07/25/2023   Procedure: RIGHT HEART CATH;  Surgeon: Jon Ezra RAMAN, MD;  Location: [facility] INVASIVE CV LAB;  Service: Cardiovascular;  Laterality: N/Williams;   RIGHT/LEFT HEART CATH AND CORONARY ANGIOGRAPHY N/Williams 10/30/2022   Procedure: RIGHT/LEFT HEART CATH AND CORONARY ANGIOGRAPHY;  Surgeon: Jon Ezra RAMAN, MD;  Location: [facility] INVASIVE CV LAB;  Service: Cardiovascular;  Laterality: N/Williams;   SPACE OAR INSTILLATION N/Williams 12/24/2017   Procedure: SPACE OAR INSTILLATION;  Surgeon: Nieves Cough, MD;  Location: [facility] ORS;  Service: Urology;  Laterality: N/Williams;   TOTAL KNEE ARTHROPLASTY Right 08/16/2017   Procedure: RIGHT TOTAL KNEE ARTHROPLASTY;  Surgeon: Shari Sieving, MD;  Location: [facility] OR;  Service: Orthopedics;  Laterality: Right;   UPPER ESOPHAGEAL ENDOSCOPIC ULTRASOUND (EUS) N/Williams 12/16/2020   Procedure: UPPER ESOPHAGEAL ENDOSCOPIC ULTRASOUND (EUS);  Surgeon: Rollin Dover, MD;  Location: [facility] ENDOSCOPY;  Service:  Endoscopy;  Laterality: N/Williams;   Family History Family History  Problem Relation Age of Onset   Hypertension Mother    Heart disease Mother    Diabetes Mother    Diabetes Father    Hypertension Father    Cancer Father        lung cancer   Healthy Sister    Heart attack Brother    Heart disease Brother 7       + tobacco   Healthy Brother     Social History Social History   Tobacco Use   Smoking status: Never    Passive exposure: Never   Smokeless tobacco: Never  Vaping Use   Vaping status: Never Used  Substance Use Topics   Alcohol use: No   Drug use: No   Allergies Aspirin , Sacubitril -valsartan , Sulfa antibiotics, and Ace inhibitors  Review of Systems Williams thorough review of systems was obtained and all systems are negative except as noted in the HPI and PMH.   Physical Exam Vital Signs  I have reviewed the triage vital signs BP 108/65   Pulse (!) 51   Temp 97.8 F (36.6 C) (Oral)   Resp 14   Ht 5' 6 (1.676 m)   Wt 73 kg   SpO2 96%   BMI 25.99 kg/m  Physical Exam Vitals and nursing note reviewed.  Constitutional:      General: He is not in acute distress.    Appearance: He is well-developed.  HENT:     Head: Normocephalic and atraumatic.     Right Ear: External ear normal.     Left Ear: External ear normal.  Mouth/Throat:     Mouth: Mucous membranes are moist.  Eyes:     General: No scleral icterus. Cardiovascular:     Rate and Rhythm: Normal rate and regular rhythm.     Pulses: Normal pulses.     Heart sounds: Normal heart sounds.  Pulmonary:     Effort: Pulmonary effort is normal. No respiratory distress.     Breath sounds: Normal breath sounds.  Abdominal:     General: Abdomen is flat.     Palpations: Abdomen is soft.     Tenderness: There is abdominal tenderness in the right lower quadrant. There is no guarding or rebound.  Musculoskeletal:     Cervical back: No rigidity.     Right lower leg: No edema.     Left lower leg: No edema.   Skin:    General: Skin is warm and dry.     Capillary Refill: Capillary refill takes less than 2 seconds.  Neurological:     Mental Status: He is alert.  Psychiatric:        Mood and Affect: Mood normal.        Behavior: Behavior normal.     ED Results and Treatments Labs (all labs ordered are listed, but only abnormal results are displayed) Labs Reviewed  COMPREHENSIVE METABOLIC PANEL WITH GFR - Abnormal; Notable for the following components:      Result Value   Sodium 134 (*)    Glucose, Bld 122 (*)    All other components within normal limits  CBC - Abnormal; Notable for the following components:   RBC 3.62 (*)    Hemoglobin 10.6 (*)    HCT 34.8 (*)    All other components within normal limits  URINALYSIS, ROUTINE W REFLEX MICROSCOPIC - Abnormal; Notable for the following components:   APPearance HAZY (*)    Glucose, UA >=500 (*)    Bacteria, UA RARE (*)    All other components within normal limits  LIPASE, BLOOD                                                                                                                          Radiology CT ABDOMEN PELVIS W CONTRAST Result Date: 08/19/2023 CLINICAL DATA:  Acute nonlocalized abdominal pain EXAM: CT ABDOMEN AND PELVIS WITH CONTRAST TECHNIQUE: Multidetector CT imaging of the abdomen and pelvis was performed using the standard protocol following bolus administration of intravenous contrast. RADIATION DOSE REDUCTION: This exam was performed according to the departmental dose-optimization program which includes automated exposure control, adjustment of the mA and/or kV according to patient size and/or use of iterative reconstruction technique. CONTRAST:  75mL OMNIPAQUE  IOHEXOL  350 MG/ML SOLN COMPARISON:  CT chest abdomen and pelvis 04/30/2023 FINDINGS: Lower chest: Lung bases are clear. Hepatobiliary: Mild diffuse fatty infiltration of the liver. Subcentimeter cyst in the inferior right lobe is unchanged. No injury follow-up is  indicated. Gallbladder and bile ducts are normal. Pancreas: Unremarkable. No pancreatic ductal dilatation or surrounding inflammatory  changes. Spleen: Normal in size without focal abnormality. Adrenals/Urinary Tract: No adrenal gland nodules. Delayed appearance of contrast material in the renal collecting system on the left compared to the right side. Mild hydronephrosis and hydroureter. No stones are identified. This could indicate occult obstructing lesion or stone, sequela of recently passed stone, or possibly renal vascular disease. Bladder is normal. Stomach/Bowel: Small gastric diverticulum. Somewhat irregular thickening of the gastric wall in the pyloric region with possible surface irregularity. This may indicate peptic ulcer disease. Small bowel and colon are not abnormally distended. Stool fills the colon. No wall thickening or inflammatory changes. Appendix is normal. Vascular/Lymphatic: Aortic atherosclerosis. No enlarged abdominal or pelvic lymph nodes. Reproductive: Metallic foreign bodies in the prostate gland consistent with seed implants. Prostate is not significantly enlarged. No significant pelvic lymphadenopathy. Other: No free air or free fluid in the abdomen. Small periumbilical hernias containing fat. Musculoskeletal: Degenerative changes in the spine and hips. Spondylolysis with moderate spondylolisthesis at L4-5. No change. There Williams few scattered subcentimeter sclerotic foci within the pelvis and acetabulum, largest adjacent to the left inferior acetabulum. Knees are unchanged since prior study and probably represent benign bone islands although early metastatic lesions are not excluded. Consider PSA correlation. IMPRESSION: 1. Delayed appearance of contrast material in the left renal collecting system with mild hydronephrosis and hydroureter. No stones are identified. Etiology is nonspecific, possibly related to partial obstruction, previous passed stone, or less likely renal vascular  disease. 2. Irregular wall thickening in the pyloric region of the stomach suggesting peptic ulcer disease. 3. Fatty infiltration of the liver. 4. Small periumbilical hernia containing fat. 5. Subcentimeter sclerotic bone lesions are similar to prior study, likely benign bone islands but consider PSA correlation if there is history of prostate cancer. Electronically Signed   By: Elsie Gravely M.D.   On: 08/19/2023 19:48    Pertinent labs & imaging results that were available during my care of the patient were reviewed by me and considered in my medical decision making (see MDM for details).  Medications Ordered in ED Medications  oxyCODONE -acetaminophen  (PERCOCET/ROXICET) 5-325 MG per tablet 1 tablet (1 tablet Oral Given 08/19/23 1613)  pantoprazole  (PROTONIX ) injection 40 mg (40 mg Intravenous Given 08/19/23 1709)  HYDROmorphone  (DILAUDID ) injection 1 mg (1 mg Intravenous Given 08/19/23 1709)  ondansetron  (ZOFRAN ) injection 4 mg (4 mg Intravenous Given 08/19/23 1709)  sodium chloride  0.9 % bolus 1,000 mL (0 mLs Intravenous Stopped 08/19/23 1904)  iohexol  (OMNIPAQUE ) 350 MG/ML injection 75 mL (75 mLs Intravenous Contrast Given 08/19/23 1844)                                                                                                                                     Procedures Procedures  (including critical care time)  Medical Decision Making / ED Course    Medical Decision Making:    Jon Williams is Williams 73 y.o. male  with past  medical history as below, significant for AICD, DM, CAD, nonischemic cardiomyopathy, gastric cancer who presents to the ED with complaint of abdominal pain. The complaint involves an extensive differential diagnosis and also carries with it Williams high risk of complications and morbidity.  Serious etiology was considered. Ddx includes but is not limited to: Differential diagnosis includes but is not exclusive to acute appendicitis, renal colic, testicular torsion, urinary  tract infection, prostatitis,  diverticulitis, small bowel obstruction, colitis, abdominal aortic aneurysm, gastroenteritis, constipation etc.   Complete initial physical exam performed, notably the patient was in no acute distress, resting comfortably on stretcher.    Reviewed and confirmed nursing documentation for past medical history, family history, social history.  Vital signs reviewed.    RLQ abd pain Hx gastric cancer, prostate cancer> -Check labs, get CT abdomen pelvis with contrast, provide analgesia -labs stable -ct with findings as noted on ED course - Pain today favored to be secondary to recently passed kidney stone - advised follow-up with his PCP regarding nonemergent findings - His pain has resolved -tolerating po, HDS, ambulatory, stable for dc -abd pain precautions   Clinical Course as of 08/19/23 2212  Mon Aug 19, 2023  2019 Feeling better [SG]  2023 CT appears c/w recently passed stone vs partial obstruction, etc. Favor likely recently passed stone as pain abruptly resolved while in the ED. Also concern for PUD, fatty liver infiltrate, and some sclerotic bone lesions; advised to f/u with pcp regarding non-emergent findings. If symptoms re-occur would be reasonable to f/u with urology [SG]    Clinical Course User Index [SG] Jon Savant A, DO     Patient in no distress and overall condition is stable. Detailed discussions were had with the patient/guardian regarding current findings, and need for close f/u with PCP or on call doctor. The patient/guardian has been instructed to return immediately if the symptoms worsen in any way for re-evaluation. Patient/guardian verbalized understanding and is in agreement with current care plan. All questions answered prior to discharge.                Additional history obtained: -Additional history obtained from spouse -External records from outside source obtained and reviewed including: Chart review including  previous notes, labs, imaging, consultation notes including  Home meds pdmp   Lab Tests: -I ordered, reviewed, and interpreted labs.   The pertinent results include:   Labs Reviewed  COMPREHENSIVE METABOLIC PANEL WITH GFR - Abnormal; Notable for the following components:      Result Value   Sodium 134 (*)    Glucose, Bld 122 (*)    All other components within normal limits  CBC - Abnormal; Notable for the following components:   RBC 3.62 (*)    Hemoglobin 10.6 (*)    HCT 34.8 (*)    All other components within normal limits  URINALYSIS, ROUTINE W REFLEX MICROSCOPIC - Abnormal; Notable for the following components:   APPearance HAZY (*)    Glucose, UA >=500 (*)    Bacteria, UA RARE (*)    All other components within normal limits  LIPASE, BLOOD    Notable for labs stable  EKG   EKG Interpretation Date/Time:    Ventricular Rate:    PR Interval:    QRS Duration:    QT Interval:    QTC Calculation:   R Axis:      Text Interpretation:           Imaging Studies ordered: I ordered imaging studies including  ctap I independently visualized the following imaging with scope of interpretation limited to determining acute life threatening conditions related to emergency care; findings noted above I agree with the radiologist interpretation If any imaging was obtained with contrast I closely monitored patient for any possible adverse reaction Williams/w contrast administration in the emergency department   Medicines ordered and prescription drug management: Meds ordered this encounter  Medications   oxyCODONE -acetaminophen  (PERCOCET/ROXICET) 5-325 MG per tablet 1 tablet    Refill:  0   pantoprazole  (PROTONIX ) injection 40 mg   HYDROmorphone  (DILAUDID ) injection 1 mg   ondansetron  (ZOFRAN ) injection 4 mg   sodium chloride  0.9 % bolus 1,000 mL   iohexol  (OMNIPAQUE ) 350 MG/ML injection 75 mL   HYDROcodone -acetaminophen  (NORCO/VICODIN) 5-325 MG tablet    Sig: Take 1 tablet by  mouth every 4 (four) hours as needed.    Dispense:  5 tablet    Refill:  0   sucralfate  (CARAFATE ) 1 g tablet    Sig: Take 1 tablet (1 g total) by mouth with breakfast, with lunch, and with evening meal for 7 days.    Dispense:  21 tablet    Refill:  0    -I have reviewed the patients home medicines and have made adjustments as needed   Consultations Obtained: na   Cardiac Monitoring: Continuous pulse oximetry interpreted by myself, 99% on RA.    Social Determinants of Health:  Diagnosis or treatment significantly limited by social determinants of health: na   Reevaluation: After the interventions noted above, I reevaluated the patient and found that they have resolved  Co morbidities that complicate the patient evaluation  Past Medical History:  Diagnosis Date   AICD (automatic cardioverter/defibrillator) present 05/25/2016   biv icd   Angio-edema    Bunion, right    Chronic combined systolic and diastolic CHF (congestive heart failure) (HCC)    Echo 1/18: Mild conc LVH, EF 15-20, severe diff HK, inf and inf-septal AK, Gr 3 DD, mild to mod MR, severe LAE, mod reduced RVSF, mod RAE, mild TR, PASP 50   Coronary artery disease involving native coronary artery without angina pectoris 04/17/2016   LHC 1/18: pLCx 30, mLCx 20, mRCA 40, dRCA 20, LVEDP 23, mean RA 8, PA 42/20, PCWP 17   Diabetes mellitus without complication (HCC)    Gastric cancer (HCC)    History of atrial fibrillation    History of atrial flutter    History of cardiomegaly 06/07/2016   Noted on CXR   History of colon polyps 06/28/2017   Noted on colonoscopy   Hypertension    LBBB (left bundle branch block)    Nausea vomiting and diarrhea 04/04/2021   NICM (nonischemic cardiomyopathy) (HCC)    Echo 1/18:  Mild conc LVH, EF 15-20, severe diff HK, inf and inf-septal AK, Gr 3 DD, mild to mod MR, severe LAE, mod reduced RVSF, mod RAE, mild TR, PASP 50   Other secondary pulmonary hypertension (HCC) 04/17/2016    Prostate cancer (HCC) 2019   Sigmoid diverticulosis 06/28/2017   Noted on colonoscopy      Dispostion: Disposition decision including need for hospitalization was considered, and patient discharged from emergency department.    Final Clinical Impression(s) / ED Diagnoses Final diagnoses:  Abdominal pain, unspecified abdominal location        Jon Jayson LABOR, DO 08/19/23 2212

## 2023-08-19 NOTE — Discharge Instructions (Addendum)
 It was a pleasure caring for you today in the emergency department.  Your CT imaging shows some chronic sclerotic bone lesions, also concern for possible peptic ulcer disease (bad indigestion) We will start you on some medication to treat this in addition to your protonix , please follow up with your pcp. Your pain is likely due to a recently passed kidney stone, be sure to drink plenty of fluids, follow kidney stone diet as noted on paperwork.   Please return to the emergency department for any worsening or worrisome symptoms.

## 2023-08-19 NOTE — ED Provider Triage Note (Signed)
 Emergency Medicine Provider Triage Evaluation Note  Jon Williams , a 73 y.o. male  was evaluated in triage.  Pt complains of hypertension, heart failure, prostate cancer, diabetes, gastric cancer who presents with concern for abdominal pain on and off for the last 3 days, also reports some back pain radiating to neck.  Denies any fever, chills, diarrhea, constipation, endorses some nausea with no vomiting..  Review of Systems  Positive: Abdominal pain, nausea Negative: Diarrhea, vomiting  Physical Exam  BP (!) 131/111   Pulse 62   Temp 97.7 F (36.5 C)   Resp 18   Ht 5' 6 (1.676 m)   Wt 73 kg   SpO2 100%   BMI 25.99 kg/m  Gen:   Awake, no distress   Resp:  Normal effort  MSK:   Moves extremities without difficulty  Other:  Focal ttp in mid abdomen, no rebound, rigidity, guarding  Medical Decision Making  Medically screening exam initiated at 4:06 PM.  Appropriate orders placed.  [name] was informed that the remainder of the evaluation will be completed by another provider, this initial triage assessment does not replace that evaluation, and the importance of remaining in the ED until their evaluation is complete.  Workup initiated in triage    Rosan Sherlean DEL, [place] 08/19/23 1608

## 2023-08-19 NOTE — ED Notes (Signed)
 Patient transported to CT

## 2023-08-20 ENCOUNTER — Inpatient Hospital Stay

## 2023-08-20 ENCOUNTER — Inpatient Hospital Stay (HOSPITAL_BASED_OUTPATIENT_CLINIC_OR_DEPARTMENT_OTHER): Admitting: Hematology

## 2023-08-20 ENCOUNTER — Other Ambulatory Visit (HOSPITAL_COMMUNITY): Payer: Self-pay

## 2023-08-20 ENCOUNTER — Inpatient Hospital Stay: Attending: Hematology

## 2023-08-20 VITALS — BP 98/64 | HR 68 | Temp 97.4°F | Resp 16 | Ht 66.0 in | Wt 162.0 lb

## 2023-08-20 DIAGNOSIS — C163 Malignant neoplasm of pyloric antrum: Secondary | ICD-10-CM | POA: Diagnosis not present

## 2023-08-20 DIAGNOSIS — Z923 Personal history of irradiation: Secondary | ICD-10-CM | POA: Insufficient documentation

## 2023-08-20 DIAGNOSIS — Z95828 Presence of other vascular implants and grafts: Secondary | ICD-10-CM

## 2023-08-20 DIAGNOSIS — C61 Malignant neoplasm of prostate: Secondary | ICD-10-CM | POA: Diagnosis not present

## 2023-08-20 DIAGNOSIS — D509 Iron deficiency anemia, unspecified: Secondary | ICD-10-CM

## 2023-08-20 DIAGNOSIS — Z85028 Personal history of other malignant neoplasm of stomach: Secondary | ICD-10-CM | POA: Diagnosis not present

## 2023-08-20 DIAGNOSIS — Z8546 Personal history of malignant neoplasm of prostate: Secondary | ICD-10-CM | POA: Insufficient documentation

## 2023-08-20 DIAGNOSIS — D649 Anemia, unspecified: Secondary | ICD-10-CM | POA: Diagnosis not present

## 2023-08-20 MED ORDER — SODIUM CHLORIDE 0.9% FLUSH
10.0000 mL | Freq: Once | INTRAVENOUS | Status: AC
  Administered 2023-08-20: 10 mL

## 2023-08-20 MED ORDER — HEPARIN SOD (PORK) LOCK FLUSH 100 UNIT/ML IV SOLN
500.0000 [IU] | Freq: Once | INTRAVENOUS | Status: AC
  Administered 2023-08-20: 500 [IU]

## 2023-08-20 NOTE — Progress Notes (Signed)
 Unable to get blood return from [facility]. Flushes well. No infusion scheduled for pt, only labs and flush. Lab appt made. Pt arrived to it. Kim in lab notified along with provider's nurse.

## 2023-08-20 NOTE — Assessment & Plan Note (Signed)
 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  S/p FLOT4 01/02/2021 - 03/22/2021 -Posttreatment EUS 05/2021 and 06/29/2022 by Dr. Elnoria Howard showed a scar and residual erythema in stomach, biopsy negative for residual malignancy.  -Jon Williams is clinically doing well on surveillance. Last CT 03/20/22 which shows no evidence of recurrent or metastatic disease, and stable bilateral adrenal adenomas -Surveillance CT scan from October 09, 2022 showed no evidence of recurrence -Surveillance CT scan from April 30, 2023 showed no evidence of residual or recurrent malignancy.  Small liver lesion is stable, favored to be benign. -He is clinically doing well overall, will continue monitoring.

## 2023-08-20 NOTE — Progress Notes (Signed)
 [facility]   [phone] [phone]   Clinic Follow up Note   Patient Care Team: [name], MD as PCP - General (Internal Medicine) [name], Aleene PARAS, MD as PCP - Cardiology (Cardiology) Waddell Danelle ORN, MD as PCP - Electrophysiology (Cardiology) Dasie Leonor CROME, MD as Consulting Physician (General Surgery) Lanny Callander, MD as Consulting Physician (Hematology) Rollin Dover, MD as Consulting Physician (Gastroenterology)  Date of Service:  08/20/2023  CHIEF COMPLAINT: f/u of gastric cancer  CURRENT THERAPY:  Cancer surveillance  Oncology History   Malignant neoplasm of prostate [facility]) -diagnosed with Gleason 4+4 prostate cancer in early 2019. He was treated with long-term ADT in combination with 8 weeks of IMRT.   Gastric cancer (HCC) cT3N0M0, stage II -Diagnosed 11/2020, path showed loss of MLH1 and PMS2 which predicts good response to immunotherapy  -Deemed a high risk surgical candidate.  S/p FLOT4 01/02/2021 - 03/22/2021 -Posttreatment EUS 05/2021 and 06/29/2022 by Dr. Rollin showed a scar and residual erythema in stomach, biopsy negative for residual malignancy.  -Jon Williams is clinically doing well on surveillance. Last CT 03/20/22 which shows no evidence of recurrent or metastatic disease, and stable bilateral adrenal adenomas -Surveillance CT scan from October 09, 2022 showed no evidence of recurrence -Surveillance CT scan from April 30, 2023 showed no evidence of residual or recurrent malignancy.  Small liver lesion is stable, favored to be benign. -He is clinically doing well overall, will continue monitoring.  Assessment & Plan Gastric cancer Diagnosed in October 2022. Recent CT scan showed stomach wall thickening without mass. No current cancer-related concerns. - Consult Dr. Rollin to review CT scan and assess need for repeat endoscopy.  His last EGD was negative in May 2024 - He is close to 3 years out, risk of recurrence has reduced.  Will  continue follow-up for total 5 years  Anemia Iron  levels improved with IV iron  infusion. Current hemoglobin at 10.6, previously 9. No blood in stool. Continues oral iron  supplementation. - Return in two months for lab work to check iron  levels. - Continue oral iron  supplementation. - Consider IV iron  if necessary.  Kidney stone First incident with severe pain resolved with medication. CT scan showed possible stone on left side with inflammation. Passed stone without further issues. - Advise increased hydration to prevent future stones. - Avoid calcium  supplements. - Monitor for recurrence.  Heart failure Heart condition noted as a factor in overall health.  Prostate cancer Treated with radiation five years ago. No ongoing treatment. PSA monitored annually by Dr. Egrich.  Plan - Recent lab and CT scan from ED visit reviewed with patient and his wife - I messaged his GI Dr. Rollin to see if he needs repeated the EGD - Lab and follow-up in 4 months - Continue port flush every 2 months   SUMMARY OF ONCOLOGIC HISTORY: Oncology History Overview Note  Cancer Staging Gastric cancer [facility]) Staging form: Stomach, AJCC 8th Edition - Clinical stage from 12/01/2020: Stage IIB (cT3, cN0, cM0) - Signed by Lanny Callander, MD on 12/20/2020 Stage prefix: Initial diagnosis Total positive nodes: 0  Malignant neoplasm of prostate (HCC) Staging form: Prostate, AJCC 8th Edition - Clinical: Stage IIC (cT2b, cN0, cM0, PSA: 6.7, Grade Group: 4) - Unsigned Prostate specific antigen (PSA) range: Less than 10 Gleason score: 8 Histologic grading system: 5 grade system    Gastric cancer (HCC)  12/01/2020 Procedure   Upper Endoscopy, Dr. Rollin  Impression: - Normal esophagus. - Malignant gastric tumor at the  incisura. Biopsied. - Normal examined duodenum.   12/01/2020 Pathology Results   FINAL MICROSCOPIC DIAGNOSIS:   A. INCISURA, BIOPSY:  - Adenocarcinoma, moderate to poorly differentiated arising in  a  background of chronic gastritis with intestinal metaplasia.    12/01/2020 Imaging   CT CAP  IMPRESSION: Focal wall thickening along the posterior aspect of the gastric antrum, likely corresponding to the patient's newly diagnosed gastric cancer.   No findings suspicious for metastatic disease.   Mild multifocal pneumonia, lower lobe predominant, likely on the basis of aspiration. Trace right pleural effusion.   Fiducial markers along the prostate in this patient with known prostate cancer.   12/01/2020 Cancer Staging   Staging form: Stomach, AJCC 8th Edition - Clinical stage from 12/01/2020: Stage IIB (cT3, cN0, cM0) - Signed by Lanny Callander, MD on 12/20/2020 Stage prefix: Initial diagnosis Total positive nodes: 0   12/08/2020 Initial Diagnosis   Gastric cancer (HCC)   12/16/2020 Procedure   EUS  - Wall thickening was seen in the lesser curve of the stomach and in the antrum of the stomach. The thickening appeared to be primarily within the serosa (Layer 5). T3 N0 Mx. - There was no sign of significant pathology in the entire pancreas. - There was no sign of significant pathology in the common bile duct and in the gallbladder. - There was no evidence of significant pathology in the left lobe of the liver. - Endosonographic images of the left adrenal gland were unremarkable. - The celiac trunk and superior mesenteric artery were endosonographically normal. - The mediastinum was unremarkable endosonographically. - No specimens collected.   01/02/2021 - 03/23/2021 Chemotherapy   Patient is on Treatment Plan : GASTROESOPHAGEAL FLOT q14d X 4 cycles     04/01/2021 Imaging   EXAM: CT ABDOMEN AND PELVIS WITHOUT CONTRAST  IMPRESSION: 1. No acute findings in the abdomen or pelvis. Specifically, no evidence for metastatic disease in the abdomen or pelvis. 2. Stable 16 mm left adrenal adenoma. 3. Small fat containing hernias in the right groin and umbilical region. 4. 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 5. Aortic Atherosclerosis (ICD10-I70.0).   04/13/2021 Imaging   EXAM: CT CHEST WITHOUT CONTRAST  IMPRESSION: 1. No evidence of metastatic disease in the chest. 2. Enlargement of the main pulmonary artery, as can be seen in pulmonary hypertension. 3. Coronary artery disease.   Aortic Atherosclerosis (ICD10-I70.0).   06/09/2021 Procedure   Upper GI Endoscopy, Dr. Rollin  Findings: -The esophagus was normal. -A medium healed ulcer was found on the lesser curvature of the stomach. Biopsies were taken with a cold forceps for histology. -The examined duodenum was normal. -Compared to the prior EGD there is a marked improvement in his gastric cancer. There was only evidence of erythema and a central scar at the incisura. Cold biopsies of the area were obtained, but there was no gross evidence of malignancy.  Impression: - Normal esophagus. - Scar in the lesser curvature of the stomach. Biopsied. - Normal examined duodenum.   06/09/2021 Pathology Results   FINAL MICROSCOPIC DIAGNOSIS:   A. STOMACH, BIOPSY:  - Gastric mucosa with acute and chronic inflammation.  - No dysplasia or malignancy identified.    06/16/2021 - 06/16/2021 Chemotherapy   Patient is on Treatment Plan : GASTROESOPHAGEAL Pembrolizumab (200) q21d        Discussed the use of AI scribe software for clinical note transcription with the patient, who gave verbal consent to proceed.  History of Present Illness Jon Williams is a 73 year old male with gastric cancer who presents for follow-up.  He recently experienced severe abdominal pain due to gallstones, characterized by burning pain in the midsection radiating to the shoulder, with stiffness and bloating. Pain began at 10 AM and persisted until 2 PM when he sought emergency care. He received pain medication and feels much better today.  Gastric cancer was diagnosed in October 2022. An  endoscopy in May of last year and a recent CT scan showed stomach wall thickening without a detectable mass. He has no recent stomach pain or bowel movement issues.  He experiences neuropathy and balance issues, feeling lightheaded and unsteady, particularly after the recent pain episode. There is no blood in urine or stool, and no current stomach pain or constipation.  He takes eighteen and a half pills daily, including oral iron  supplements, and has previously received IV iron  infusions.     All other systems were reviewed with the patient and are negative.  MEDICAL HISTORY:  Past Medical History:  Diagnosis Date   AICD (automatic cardioverter/defibrillator) present 05/25/2016   biv icd   Angio-edema    Bunion, right    Chronic combined systolic and diastolic CHF (congestive heart failure) (HCC)    Echo 1/18: Mild conc LVH, EF 15-20, severe diff HK, inf and inf-septal AK, Gr 3 DD, mild to mod Jon, severe LAE, mod reduced RVSF, mod RAE, mild TR, PASP 50   Coronary artery disease involving native coronary artery without angina pectoris 04/17/2016   LHC 1/18: pLCx 30, mLCx 20, mRCA 40, dRCA 20, LVEDP 23, mean RA 8, PA 42/20, PCWP 17   Diabetes mellitus without complication (HCC)    Gastric cancer (HCC)    History of atrial fibrillation    History of atrial flutter    History of cardiomegaly 06/07/2016   Noted on CXR   History of colon polyps 06/28/2017   Noted on colonoscopy   Hypertension    LBBB (left bundle branch block)    Nausea vomiting and diarrhea 04/04/2021   NICM (nonischemic cardiomyopathy) (HCC)    Echo 1/18:  Mild conc LVH, EF 15-20, severe diff HK, inf and inf-septal AK, Gr 3 DD, mild to mod Jon, severe LAE, mod reduced RVSF, mod RAE, mild TR, PASP 50   Other secondary pulmonary hypertension (HCC) 04/17/2016   Prostate cancer (HCC) 2019   Sigmoid diverticulosis 06/28/2017   Noted on colonoscopy    SURGICAL HISTORY: Past Surgical History:  Procedure Laterality Date    BIOPSY  12/01/2020   Procedure: BIOPSY;  Surgeon: Rollin Dover, MD;  Location: [facility] ENDOSCOPY;  Service: Endoscopy;;   BIOPSY  06/09/2021   Procedure: BIOPSY;  Surgeon: Rollin Dover, MD;  Location: [facility] ENDOSCOPY;  Service: Gastroenterology;;   BIOPSY  06/29/2022   Procedure: BIOPSY;  Surgeon: Rollin Dover, MD;  Location: [facility] ENDOSCOPY;  Service: Gastroenterology;;   BIV ICD INSERTION CRT-D N/A 05/25/2016   Procedure: BiV ICD Insertion CRT-D;  Surgeon: Danelle LELON Birmingham, MD;  Location: [facility] INVASIVE CV LAB;  Service: Cardiovascular;  Laterality: N/A;   CARDIAC CATHETERIZATION N/A 03/02/2016   Procedure: Right/Left Heart Cath and Coronary Angiography;  Surgeon: Lonni Hanson, MD;  Location: [facility] INVASIVE CV LAB;  Service: Cardiovascular;  Laterality: N/A;   CARDIOVERSION N/A 07/17/2016   Procedure: Cardioversion;  Surgeon: Birmingham Danelle LELON, MD;  Location: [facility] INVASIVE CV LAB;  Service: Cardiovascular;  Laterality: N/A;   COLONOSCOPY WITH PROPOFOL  N/A 06/28/2017   Procedure:  COLONOSCOPY WITH PROPOFOL ;  Surgeon: Rollin Dover, MD;  Location: [facility] ENDOSCOPY;  Service: Endoscopy;  Laterality: N/A;   colonscopy  2009   ESOPHAGOGASTRODUODENOSCOPY N/A 12/01/2020   Procedure: ESOPHAGOGASTRODUODENOSCOPY (EGD);  Surgeon: Rollin Dover, MD;  Location: [facility] ENDOSCOPY;  Service: Endoscopy;  Laterality: N/A;  IDA/guaiac positive stools   ESOPHAGOGASTRODUODENOSCOPY (EGD) WITH PROPOFOL  N/A 12/16/2020   Procedure: ESOPHAGOGASTRODUODENOSCOPY (EGD) WITH PROPOFOL ;  Surgeon: Rollin Dover, MD;  Location: [facility] ENDOSCOPY;  Service: Endoscopy;  Laterality: N/A;   ESOPHAGOGASTRODUODENOSCOPY (EGD) WITH PROPOFOL  N/A 06/09/2021   Procedure: ESOPHAGOGASTRODUODENOSCOPY (EGD) WITH PROPOFOL ;  Surgeon: Rollin Dover, MD;  Location: [facility] ENDOSCOPY;  Service: Gastroenterology;  Laterality: N/A;   ESOPHAGOGASTRODUODENOSCOPY (EGD) WITH PROPOFOL  N/A 06/29/2022   Procedure: ESOPHAGOGASTRODUODENOSCOPY (EGD) WITH PROPOFOL ;  Surgeon: Rollin Dover, MD;   Location: [facility] ENDOSCOPY;  Service: Gastroenterology;  Laterality: N/A;   GOLD SEED IMPLANT N/A 12/24/2017   Procedure: GOLD SEED IMPLANT, TRANSERINEAL;  Surgeon: Nieves Cough, MD;  Location: [facility] ORS;  Service: Urology;  Laterality: N/A;   INSERT / REPLACE / REMOVE PACEMAKER     LEAD REVISION  10/10/2018   LEAD REVISION/REPAIR N/A 10/10/2018   Procedure: LEAD REVISION/REPAIR;  Surgeon: Waddell Danelle ORN, MD;  Location: [facility] INVASIVE CV LAB;  Service: Cardiovascular;  Laterality: N/A;   POLYPECTOMY  06/28/2017   Procedure: POLYPECTOMY;  Surgeon: Rollin Dover, MD;  Location: [facility] ENDOSCOPY;  Service: Endoscopy;;  ascending and descending colon polyp   PORTACATH PLACEMENT Right 12/23/2020   Procedure: INSERTION PORT-A-CATH;  Surgeon: Dasie Leonor CROME, MD;  Location: [facility] OR;  Service: General;  Laterality: Right;   PROSTATE BIOPSY  02/20/2017   RIGHT HEART CATH N/A 07/25/2023   Procedure: RIGHT HEART CATH;  Surgeon: Rolan Ezra RAMAN, MD;  Location: [facility] INVASIVE CV LAB;  Service: Cardiovascular;  Laterality: N/A;   RIGHT/LEFT HEART CATH AND CORONARY ANGIOGRAPHY N/A 10/30/2022   Procedure: RIGHT/LEFT HEART CATH AND CORONARY ANGIOGRAPHY;  Surgeon: Rolan Ezra RAMAN, MD;  Location: [facility] INVASIVE CV LAB;  Service: Cardiovascular;  Laterality: N/A;   SPACE OAR INSTILLATION N/A 12/24/2017   Procedure: SPACE OAR INSTILLATION;  Surgeon: Nieves Cough, MD;  Location: [facility] ORS;  Service: Urology;  Laterality: N/A;   TOTAL KNEE ARTHROPLASTY Right 08/16/2017   Procedure: RIGHT TOTAL KNEE ARTHROPLASTY;  Surgeon: Shari Sieving, MD;  Location: [facility] OR;  Service: Orthopedics;  Laterality: Right;   UPPER ESOPHAGEAL ENDOSCOPIC ULTRASOUND (EUS) N/A 12/16/2020   Procedure: UPPER ESOPHAGEAL ENDOSCOPIC ULTRASOUND (EUS);  Surgeon: Rollin Dover, MD;  Location: [facility] ENDOSCOPY;  Service: Endoscopy;  Laterality: N/A;    I have reviewed the social history and family history with the patient and they are unchanged from previous  note.  ALLERGIES:  is allergic to aspirin , sacubitril -valsartan , sulfa antibiotics, and ace inhibitors.  MEDICATIONS:  Current Outpatient Medications  Medication Sig Dispense Refill   Accu-Chek Softclix Lancets lancets Use to check your blood sugar finger stick once a day Dx Code E11.9 90 days 100 each 3   atorvastatin  (LIPITOR) 40 MG tablet Take 1 tablet (40 mg total) by mouth daily. 90 tablet 3   Biotin 1000 MCG tablet Take 1,000 mcg by mouth daily with breakfast.     Blood Glucose Monitoring Suppl (ACCU-CHEK GUIDE) w/Device KIT as directed to check blood sugar once a day 1 kit 0   cholecalciferol  (VITAMIN D3) 25 MCG (1000 UT) tablet Take 1,000 Units by mouth daily with breakfast.     digoxin  (LANOXIN ) 0.125 MG tablet Take 1/2 tablet (0.0625mg  total) by mouth daily. 15 tablet  6   empagliflozin  (JARDIANCE ) 10 MG TABS tablet Take 1 tablet (10 mg total) by mouth daily. 90 tablet 3   EPINEPHrine  0.3 mg/0.3 mL IJ SOAJ injection Inject 0.3 mg into the muscle as needed for anaphylaxis. 2 each 1   famotidine  (PEPCID ) 20 MG tablet Take 1 tablet (20 mg total) by mouth once a day at bedtime as needed. 30 tablet 3   ferrous sulfate 325 (65 FE) MG tablet Take 325 mg by mouth daily with breakfast.     furosemide  (LASIX ) 40 MG tablet Take 1 tablet (40 mg total) by mouth daily. 30 tablet 6   gabapentin  (NEURONTIN ) 100 MG capsule Take 1 capsule (100 mg total) by mouth 2 (two) times daily. 180 capsule 1   glucose blood (ACCU-CHEK GUIDE TEST) test strip Use to check blood sugars once daily. 100 each 3   HYDROcodone -acetaminophen  (NORCO/VICODIN) 5-325 MG tablet Take 1 tablet by mouth every 4 (four) hours as needed. 5 tablet 0   ipratropium (ATROVENT ) 0.03 % nasal spray Place 1-2 sprays into both nostrils 2 (two) times daily as needed (nasal drainage). 30 mL 2   ketoconazole  (NIZORAL ) 2 % cream Apply 1 Application topically 2 (two) times daily. 30 g 3   lidocaine -prilocaine  (EMLA ) cream Apply 1 Application  topically as needed. 30 g 0   loratadine  (CLARITIN ) 10 MG tablet Take 10 mg by mouth daily.     metFORMIN  (GLUCOPHAGE -XR) 500 MG 24 hr tablet Take 1 tablet (500 mg total) by mouth with evening meal 90 tablet 3   metoprolol  succinate (TOPROL -XL) 25 MG 24 hr tablet Take 1 tablet (25 mg total) by mouth daily. 90 tablet 3   multivitamin (ONE-A-DAY MEN'S) TABS tablet Take 1 tablet by mouth daily with breakfast.     Naftifine HCl (NAFTIN) 2 % GEL Apply 1 application  topically every Monday, Wednesday, and Friday.     pantoprazole  (PROTONIX ) 40 MG tablet Take 1 tablet (40 mg total) by mouth 2 (two) times daily. 180 tablet 3   potassium chloride  (KLOR-CON ) 10 MEQ tablet Take 2 tablets (20 mEq total) by mouth daily. 60 tablet 3   rivaroxaban  (XARELTO ) 20 MG TABS tablet Take 1 tablet (20 mg total) by mouth daily with supper. 90 tablet 3   spironolactone  (ALDACTONE ) 25 MG tablet Take 1 tablet (25 mg total) by mouth daily. 90 tablet 3   sucralfate  (CARAFATE ) 1 g tablet Take 1 tablet (1 g total) by mouth with breakfast, with lunch, and with evening meal for 7 days. 21 tablet 0   Tavaborole 5 % SOLN Apply 1 application  topically daily as needed (Nail on left hand).     valsartan  (DIOVAN ) 40 MG tablet Take 1 tablet (40 mg total) by mouth 2 (two) times daily. 60 tablet 11   No current facility-administered medications for this visit.    PHYSICAL EXAMINATION: ECOG PERFORMANCE STATUS: 2 - Symptomatic, <50% confined to bed  Vitals:   08/20/23 1044  BP: 98/64  Pulse: 68  Resp: 16  Temp: (!) 97.4 F (36.3 C)  SpO2: 100%   Wt Readings from Last 3 Encounters:  08/20/23 162 lb (73.5 kg)  08/19/23 161 lb (73 kg)  08/13/23 161 lb (73 kg)     GENERAL:alert, no distress and comfortable SKIN: skin color, texture, turgor are normal, no rashes or significant lesions EYES: normal, Conjunctiva are pink and non-injected, sclera clear Musculoskeletal:no cyanosis of digits and no clubbing  NEURO: alert &  oriented x 3 with fluent speech,  no focal motor/sensory deficits  Physical Exam    LABORATORY DATA:  I have reviewed the data as listed    Latest Ref Rng & Units 08/19/2023    4:07 PM 07/27/2023    7:56 AM 07/25/2023    8:21 AM  CBC  WBC 4.0 - 10.5 K/uL 7.2  7.4    Hemoglobin 13.0 - 17.0 g/dL 89.3  9.4  9.5   Hematocrit 39.0 - 52.0 % 34.8  30.5  28.0   Platelets 150 - 400 K/uL 346  296          Latest Ref Rng & Units 08/19/2023    4:07 PM 07/31/2023   10:47 AM 07/27/2023    7:56 AM  CMP  Glucose 70 - 99 mg/dL 877   97   BUN 8 - 23 mg/dL 21   20   Creatinine 9.38 - 1.24 mg/dL 8.75   8.59   Sodium 864 - 145 mmol/L 134   139   Potassium 3.5 - 5.1 mmol/L 4.4   4.4   Chloride 98 - 111 mmol/L 100   106   CO2 22 - 32 mmol/L 22   21   Calcium  8.9 - 10.3 mg/dL 9.1   8.9   Total Protein 6.5 - 8.1 g/dL 7.2  7.0  6.1   Total Bilirubin 0.0 - 1.2 mg/dL 0.5   0.7   Alkaline Phos 38 - 126 U/L 44   40   AST 15 - 41 U/L 18   25   ALT 0 - 44 U/L 17   15       RADIOGRAPHIC STUDIES: I have personally reviewed the radiological images as listed and agreed with the findings in the report. CT ABDOMEN PELVIS W CONTRAST Result Date: 08/19/2023 CLINICAL DATA:  Acute nonlocalized abdominal pain EXAM: CT ABDOMEN AND PELVIS WITH CONTRAST TECHNIQUE: Multidetector CT imaging of the abdomen and pelvis was performed using the standard protocol following bolus administration of intravenous contrast. RADIATION DOSE REDUCTION: This exam was performed according to the departmental dose-optimization program which includes automated exposure control, adjustment of the mA and/or kV according to patient size and/or use of iterative reconstruction technique. CONTRAST:  75mL OMNIPAQUE  IOHEXOL  350 MG/ML SOLN COMPARISON:  CT chest abdomen and pelvis 04/30/2023 FINDINGS: Lower chest: Lung bases are clear. Hepatobiliary: Mild diffuse fatty infiltration of the liver. Subcentimeter cyst in the inferior right lobe is unchanged.  No injury follow-up is indicated. Gallbladder and bile ducts are normal. Pancreas: Unremarkable. No pancreatic ductal dilatation or surrounding inflammatory changes. Spleen: Normal in size without focal abnormality. Adrenals/Urinary Tract: No adrenal gland nodules. Delayed appearance of contrast material in the renal collecting system on the left compared to the right side. Mild hydronephrosis and hydroureter. No stones are identified. This could indicate occult obstructing lesion or stone, sequela of recently passed stone, or possibly renal vascular disease. Bladder is normal. Stomach/Bowel: Small gastric diverticulum. Somewhat irregular thickening of the gastric wall in the pyloric region with possible surface irregularity. This may indicate peptic ulcer disease. Small bowel and colon are not abnormally distended. Stool fills the colon. No wall thickening or inflammatory changes. Appendix is normal. Vascular/Lymphatic: Aortic atherosclerosis. No enlarged abdominal or pelvic lymph nodes. Reproductive: Metallic foreign bodies in the prostate gland consistent with seed implants. Prostate is not significantly enlarged. No significant pelvic lymphadenopathy. Other: No free air or free fluid in the abdomen. Small periumbilical hernias containing fat. Musculoskeletal: Degenerative changes in the spine and hips. Spondylolysis with moderate spondylolisthesis at  L4-5. No change. There a few scattered subcentimeter sclerotic foci within the pelvis and acetabulum, largest adjacent to the left inferior acetabulum. Knees are unchanged since prior study and probably represent benign bone islands although early metastatic lesions are not excluded. Consider PSA correlation. IMPRESSION: 1. Delayed appearance of contrast material in the left renal collecting system with mild hydronephrosis and hydroureter. No stones are identified. Etiology is nonspecific, possibly related to partial obstruction, previous passed stone, or less  likely renal vascular disease. 2. Irregular wall thickening in the pyloric region of the stomach suggesting peptic ulcer disease. 3. Fatty infiltration of the liver. 4. Small periumbilical hernia containing fat. 5. Subcentimeter sclerotic bone lesions are similar to prior study, likely benign bone islands but consider PSA correlation if there is history of prostate cancer. Electronically Signed   By: Elsie Gravely M.D.   On: 08/19/2023 19:48      Orders Placed This Encounter  Procedures   Ferritin    Standing Status:   Standing    Number of Occurrences:   20    Expiration Date:   08/19/2024   All questions were answered. The patient knows to call the clinic with any problems, questions or concerns. No barriers to learning was detected. The total time spent in the appointment was 25 minutes, including review of chart and various tests results, discussions about plan of care and coordination of care plan     Onita Mattock, MD 08/20/2023

## 2023-08-20 NOTE — Assessment & Plan Note (Signed)
-  diagnosed with Gleason 4+4 prostate cancer in early 2019. He was treated with long-term ADT in combination with 8 weeks of IMRT.

## 2023-08-21 ENCOUNTER — Encounter: Payer: Self-pay | Admitting: Hematology

## 2023-08-21 ENCOUNTER — Encounter (HOSPITAL_COMMUNITY): Admitting: Cardiology

## 2023-08-22 ENCOUNTER — Telehealth: Payer: Self-pay | Admitting: Internal Medicine

## 2023-08-22 DIAGNOSIS — B351 Tinea unguium: Secondary | ICD-10-CM | POA: Diagnosis not present

## 2023-08-22 DIAGNOSIS — L603 Nail dystrophy: Secondary | ICD-10-CM | POA: Diagnosis not present

## 2023-08-22 NOTE — Telephone Encounter (Signed)
 Pt spouse called in asking to speak with nurse. She has a couple questions about pt's appt tomorrow.

## 2023-08-22 NOTE — Telephone Encounter (Signed)
 Left message for patient's wife (DPR) to call back. Left detailed message on phone that message would be sent to Dr. Adrian nurse.

## 2023-08-23 ENCOUNTER — Telehealth: Payer: Self-pay | Admitting: Internal Medicine

## 2023-08-23 ENCOUNTER — Ambulatory Visit: Admitting: Internal Medicine

## 2023-08-23 ENCOUNTER — Encounter: Payer: Self-pay | Admitting: Cardiology

## 2023-08-23 ENCOUNTER — Ambulatory Visit
Admission: RE | Admit: 2023-08-23 | Discharge: 2023-08-23 | Disposition: A | Discharge status: Home / Self Care | Source: Ambulatory Visit | Attending: Cardiology

## 2023-08-23 ENCOUNTER — Other Ambulatory Visit (HOSPITAL_COMMUNITY): Payer: Self-pay

## 2023-08-23 VITALS — BP 104/60 | Ht 66.0 in | Wt 160.2 lb

## 2023-08-23 DIAGNOSIS — I428 Other cardiomyopathies: Secondary | ICD-10-CM | POA: Diagnosis not present

## 2023-08-23 DIAGNOSIS — I5022 Chronic systolic (congestive) heart failure: Secondary | ICD-10-CM | POA: Insufficient documentation

## 2023-08-23 DIAGNOSIS — Z950 Presence of cardiac pacemaker: Secondary | ICD-10-CM | POA: Diagnosis not present

## 2023-08-23 DIAGNOSIS — C169 Malignant neoplasm of stomach, unspecified: Secondary | ICD-10-CM | POA: Diagnosis not present

## 2023-08-23 DIAGNOSIS — I442 Atrioventricular block, complete: Secondary | ICD-10-CM | POA: Insufficient documentation

## 2023-08-23 LAB — CBC
HCT: 33 % — ABNORMAL LOW (ref 39.0–52.0)
Hemoglobin: 10.4 g/dL — ABNORMAL LOW (ref 13.0–17.0)
MCH: 29.5 pg (ref 26.0–34.0)
MCHC: 31.5 g/dL (ref 30.0–36.0)
MCV: 93.5 fL (ref 80.0–100.0)
Platelets: 356 K/uL (ref 150–400)
RBC: 3.53 MIL/uL — ABNORMAL LOW (ref 4.22–5.81)
RDW: 14.6 % (ref 11.5–15.5)
WBC: 7.2 K/uL (ref 4.0–10.5)
nRBC: 0 % (ref 0.0–0.2)

## 2023-08-23 LAB — BASIC METABOLIC PANEL WITH GFR
Anion gap: 13 (ref 5–15)
BUN: 25 mg/dL — ABNORMAL HIGH (ref 8–23)
CO2: 21 mmol/L — ABNORMAL LOW (ref 22–32)
Calcium: 9.3 mg/dL (ref 8.9–10.3)
Chloride: 97 mmol/L — ABNORMAL LOW (ref 98–111)
Creatinine, Ser: 1.09 mg/dL (ref 0.61–1.24)
GFR, Estimated: >60 mL/min (ref >60)
Glucose, Bld: 100 mg/dL — ABNORMAL HIGH (ref 70–99)
Potassium: 4.3 mmol/L (ref 3.5–5.1)
Sodium: 131 mmol/L — ABNORMAL LOW (ref 135–145)

## 2023-08-23 LAB — BRAIN NATRIURETIC PEPTIDE: B Natriuretic Peptide: 345.6 pg/mL — ABNORMAL HIGH (ref 0.0–100.0)

## 2023-08-23 LAB — DIGOXIN LEVEL: Digoxin Level: <0.4 ng/mL — ABNORMAL LOW (ref 0.8–2.0)

## 2023-08-23 NOTE — Telephone Encounter (Addendum)
   Cardiac Monitor Alert  Date of alert:  08/23/2023   Patient Name: Jon Williams  DOB: 1951/02/10  MRN: [id number]   [facility] Cardiologist: Aleene Passe, MD  [facility] EP:  Danelle Birmingham, MD    Monitor Information: Long Term Monitor-Live Telemetry [ZioAT]  Reason:  PVC'S Ordering provider:  [place] Lee, NP {  Alert Ventricular Tachycardia This is the 1st alert for this rhythm.  Ventricular pacing- Vtach for 15 seconds; avg hr 152  Next Cardiology Appointment   Date:  08/23/23  Provider:  Dr Birmingham  Pt seen in office today    Comer LITTIE Ebbs, RN  08/23/2023 1:06 PM

## 2023-08-23 NOTE — Patient Instructions (Addendum)
 Medication Instructions:  Your physician recommends that you continue on your current medications as directed. Please refer to the Current Medication list given to you today.  *If you need a refill on your cardiac medications before your next appointment, please call your pharmacy*  Lab Work: Labs within 30 days of procedure.  You may go to any [facility] Location for your lab work:  [facility] - [address] ([facility]) - [address] [phone] [address]  [place] - [address]   [place]  - [address]   [place] - [address] - [address] [employer]  - [address] ([facility]   If you have labs (blood work) drawn today and your tests are completely normal, you will receive your results only by: [employer] (if you have MyChart)  If you have any lab test that is abnormal or we need to change your treatment, we will call you or send a MyChart message to review the results.  Testing/Procedures: None ordered.  Follow-Up: At [facility], you and your health needs are our priority.  As part of our continuing mission to provide you with exceptional heart care, we have created designated Provider Care Teams.  These Care Teams include your primary Cardiologist (physician) and Advanced Practice Providers (APPs -  Physician Assistants and Nurse Practitioners) who all work together to provide you with the care you need, when you need it.  Your next appointment:   To be determined  The format for your next appointment:   In Person  Provider:   Danelle Birmingham, MD{or one of the following Advanced Practice Providers on your designated Care Team:   Charlies Arthur, [place] Ozell Jodie Passey, [place] Leotis Barrack, NP  Note: Remote monitoring is used to monitor your Pacemaker/ ICD from home. This monitoring reduces the number of office  visits required to check your device to one time per year. It allows us  to keep an eye on the functioning of your device to ensure it is working properly.

## 2023-08-23 NOTE — Telephone Encounter (Signed)
 [facility] calling to report urgent results.

## 2023-08-23 NOTE — Addendum Note (Signed)
 Encounter addended by: Jonathan Corpus N on: 08/23/2023 12:24 PM  Actions taken: Imaging Exam ended

## 2023-08-23 NOTE — Addendum Note (Signed)
 Encounter addended by: Debria Broecker N on: 08/23/2023 12:45 PM  Actions taken: Imaging Exam ended

## 2023-08-23 NOTE — Telephone Encounter (Signed)
 Pt seen 08/23/23. Closing encounter

## 2023-08-23 NOTE — Progress Notes (Signed)
 HPI Jon Williams returns today for followup. He is a pleasant 73 yo man with a h/o non-ischemic CM chronic systolic heart failure and a diagnosis of gastric CA for which he has undergone port placement and is receiving chemo therapy. He has had a h/o atrial fib. He feels well. He is no longer on chemo but undergoing surviellance.  He denies chest pain or sob. No ICD therapies.  He has had episodes of sob and CHF admits and has pacing induced LBBB with a QRS of 190. At the time of his attempted revision in 2020, he had no coronary veins for LV lead insertion. We did not know about left bundle pacing for resynch at that time. He presents today to consider additional options. When I saw him last he downplayed his symptoms but in the interim his symptoms have worsened and he has been encouraged by his CHF team to consider resynchronization. I have reviewed his old coronary vein scans.  Allergies  Allergen Reactions   Aspirin  Anaphylaxis and Hives   Sacubitril -Valsartan  Swelling    lip swelling, angioedema   Sulfa Antibiotics Anaphylaxis, Hives, Swelling and Other (See Comments)    Swollen lips   Ace Inhibitors     Possible angioedema     Current Outpatient Medications  Medication Sig Dispense Refill   Accu-Chek Softclix Lancets lancets Use to check your blood sugar finger stick once a day Dx Code E11.9 90 days 100 each 3   atorvastatin  (LIPITOR) 40 MG tablet Take 1 tablet (40 mg total) by mouth daily. 90 tablet 3   Biotin 1000 MCG tablet Take 1,000 mcg by mouth daily with breakfast.     Blood Glucose Monitoring Suppl (ACCU-CHEK GUIDE) w/Device KIT as directed to check blood sugar once a day 1 kit 0   cholecalciferol  (VITAMIN D3) 25 MCG (1000 UT) tablet Take 1,000 Units by mouth daily with breakfast.     digoxin  (LANOXIN ) 0.125 MG tablet Take 1/2 tablet (0.0625mg  total) by mouth daily. 15 tablet 6   empagliflozin  (JARDIANCE ) 10 MG TABS tablet Take 1 tablet (10 mg total) by mouth daily. 90  tablet 3   EPINEPHrine  0.3 mg/0.3 mL IJ SOAJ injection Inject 0.3 mg into the muscle as needed for anaphylaxis. 2 each 1   famotidine  (PEPCID ) 20 MG tablet Take 1 tablet (20 mg total) by mouth once a day at bedtime as needed. 30 tablet 3   ferrous sulfate 325 (65 FE) MG tablet Take 325 mg by mouth daily with breakfast.     furosemide  (LASIX ) 40 MG tablet Take 1 tablet (40 mg total) by mouth daily. 30 tablet 6   gabapentin  (NEURONTIN ) 100 MG capsule Take 1 capsule (100 mg total) by mouth 2 (two) times daily. 180 capsule 1   glucose blood (ACCU-CHEK GUIDE TEST) test strip Use to check blood sugars once daily. 100 each 3   HYDROcodone -acetaminophen  (NORCO/VICODIN) 5-325 MG tablet Take 1 tablet by mouth every 4 (four) hours as needed. 5 tablet 0   ipratropium (ATROVENT ) 0.03 % nasal spray Place 1-2 sprays into both nostrils 2 (two) times daily as needed (nasal drainage). 30 mL 2   ketoconazole  (NIZORAL ) 2 % cream Apply 1 Application topically 2 (two) times daily. 30 g 3   lidocaine -prilocaine  (EMLA ) cream Apply 1 Application topically as needed. 30 g 0   loratadine  (CLARITIN ) 10 MG tablet Take 10 mg by mouth daily.     metFORMIN  (GLUCOPHAGE -XR) 500 MG 24 hr tablet Take 1 tablet (  500 mg total) by mouth with evening meal 90 tablet 3   metoprolol  succinate (TOPROL -XL) 25 MG 24 hr tablet Take 1 tablet (25 mg total) by mouth daily. 90 tablet 3   multivitamin (ONE-A-DAY MEN'S) TABS tablet Take 1 tablet by mouth daily with breakfast.     Naftifine HCl (NAFTIN) 2 % GEL Apply 1 application  topically every Monday, Wednesday, and Friday.     pantoprazole  (PROTONIX ) 40 MG tablet Take 1 tablet (40 mg total) by mouth 2 (two) times daily. 180 tablet 3   potassium chloride  (KLOR-CON ) 10 MEQ tablet Take 2 tablets (20 mEq total) by mouth daily. 60 tablet 3   rivaroxaban  (XARELTO ) 20 MG TABS tablet Take 1 tablet (20 mg total) by mouth daily with supper. 90 tablet 3   spironolactone  (ALDACTONE ) 25 MG tablet Take 1  tablet (25 mg total) by mouth daily. 90 tablet 3   sucralfate  (CARAFATE ) 1 g tablet Take 1 tablet (1 g total) by mouth with breakfast, with lunch, and with evening meal for 7 days. 21 tablet 0   Tavaborole 5 % SOLN Apply 1 application  topically daily as needed (Nail on left hand).     valsartan  (DIOVAN ) 40 MG tablet Take 1 tablet (40 mg total) by mouth 2 (two) times daily. 60 tablet 11   No current facility-administered medications for this visit.     Past Medical History:  Diagnosis Date   AICD (automatic cardioverter/defibrillator) present 05/25/2016   biv icd   Angio-edema    Bunion, right    Chronic combined systolic and diastolic CHF (congestive heart failure) (HCC)    Echo 1/18: Mild conc LVH, EF 15-20, severe diff HK, inf and inf-septal AK, Gr 3 DD, mild to mod MR, severe LAE, mod reduced RVSF, mod RAE, mild TR, PASP 50   Coronary artery disease involving native coronary artery without angina pectoris 04/17/2016   LHC 1/18: pLCx 30, mLCx 20, mRCA 40, dRCA 20, LVEDP 23, mean RA 8, PA 42/20, PCWP 17   Diabetes mellitus without complication (HCC)    Gastric cancer (HCC)    History of atrial fibrillation    History of atrial flutter    History of cardiomegaly 06/07/2016   Noted on CXR   History of colon polyps 06/28/2017   Noted on colonoscopy   Hypertension    LBBB (left bundle branch block)    Nausea vomiting and diarrhea 04/04/2021   NICM (nonischemic cardiomyopathy) (HCC)    Echo 1/18:  Mild conc LVH, EF 15-20, severe diff HK, inf and inf-septal AK, Gr 3 DD, mild to mod MR, severe LAE, mod reduced RVSF, mod RAE, mild TR, PASP 50   Other secondary pulmonary hypertension (HCC) 04/17/2016   Prostate cancer (HCC) 2019   Sigmoid diverticulosis 06/28/2017   Noted on colonoscopy    ROS:   All systems reviewed and negative except as noted in the HPI.   Past Surgical History:  Procedure Laterality Date   BIOPSY  12/01/2020   Procedure: BIOPSY;  Surgeon: Rollin Dover, MD;   Location: [facility] ENDOSCOPY;  Service: Endoscopy;;   BIOPSY  06/09/2021   Procedure: BIOPSY;  Surgeon: Rollin Dover, MD;  Location: [facility];  Service: Gastroenterology;;   BIOPSY  06/29/2022   Procedure: BIOPSY;  Surgeon: Rollin Dover, MD;  Location: [facility];  Service: Gastroenterology;;   BIV ICD INSERTION CRT-D N/A 05/25/2016   Procedure: BiV ICD Insertion CRT-D;  Surgeon: Jon LELON Birmingham, MD;  Location: [facility] INVASIVE CV LAB;  Service:  Cardiovascular;  Laterality: N/A;   CARDIAC CATHETERIZATION N/A 03/02/2016   Procedure: Right/Left Heart Cath and Coronary Angiography;  Surgeon: Lonni Hanson, MD;  Location[facility] INVASIVE CV LAB;  Service: Cardiovascular;  Laterality: N/A;   CARDIOVERSION N/A 07/17/2016   Procedure: Cardioversion;  Surgeon: Waddell Jon ORN, MD;  Location: [facility] INVASIVE CV LAB;  Service: Cardiovascular;  Laterality: N/A;   COLONOSCOPY WITH PROPOFOL  N/A 06/28/2017   Procedure: COLONOSCOPY WITH PROPOFOL ;  Surgeon: Rollin Dover, MD;  Location: [facility] ENDOSCOPY;  Service: Endoscopy;  Laterality: N/A;   colonscopy  2009   ESOPHAGOGASTRODUODENOSCOPY N/A 12/01/2020   Procedure: ESOPHAGOGASTRODUODENOSCOPY (EGD);  Surgeon: Rollin Dover, MD;  Location: [facility] ENDOSCOPY;  Service: Endoscopy;  Laterality: N/A;  IDA/guaiac positive stools   ESOPHAGOGASTRODUODENOSCOPY (EGD) WITH PROPOFOL  N/A 12/16/2020   Procedure: ESOPHAGOGASTRODUODENOSCOPY (EGD) WITH PROPOFOL ;  Surgeon: Rollin Dover, MD;  Location: [facility] ENDOSCOPY;  Service: Endoscopy;  Laterality: N/A;   ESOPHAGOGASTRODUODENOSCOPY (EGD) WITH PROPOFOL  N/A 06/09/2021   Procedure: ESOPHAGOGASTRODUODENOSCOPY (EGD) WITH PROPOFOL ;  Surgeon: Rollin Dover, MD;  Location: [facility] ENDOSCOPY;  Service: Gastroenterology;  Laterality: N/A;   ESOPHAGOGASTRODUODENOSCOPY (EGD) WITH PROPOFOL  N/A 06/29/2022   Procedure: ESOPHAGOGASTRODUODENOSCOPY (EGD) WITH PROPOFOL ;  Surgeon: Rollin Dover, MD;  Location: [facility] ENDOSCOPY;  Service: Gastroenterology;  Laterality: N/A;   GOLD  SEED IMPLANT N/A 12/24/2017   Procedure: GOLD SEED IMPLANT, TRANSERINEAL;  Surgeon: Nieves Cough, MD;  Location: [facility] ORS;  Service: Urology;  Laterality: N/A;   INSERT / REPLACE / REMOVE PACEMAKER     LEAD REVISION  10/10/2018   LEAD REVISION/REPAIR N/A 10/10/2018   Procedure: LEAD REVISION/REPAIR;  Surgeon: Waddell Jon ORN, MD;  Location: [facility] INVASIVE CV LAB;  Service: Cardiovascular;  Laterality: N/A;   POLYPECTOMY  06/28/2017   Procedure: POLYPECTOMY;  Surgeon: Rollin Dover, MD;  Location: [facility] ENDOSCOPY;  Service: Endoscopy;;  ascending and descending colon polyp   PORTACATH PLACEMENT Right 12/23/2020   Procedure: INSERTION PORT-A-CATH;  Surgeon: Dasie Leonor CROME, MD;  Location: [facility] OR;  Service: General;  Laterality: Right;   PROSTATE BIOPSY  02/20/2017   RIGHT HEART CATH N/A 07/25/2023   Procedure: RIGHT HEART CATH;  Surgeon: Rolan Ezra RAMAN, MD;  Location: [facility] INVASIVE CV LAB;  Service: Cardiovascular;  Laterality: N/A;   RIGHT/LEFT HEART CATH AND CORONARY ANGIOGRAPHY N/A 10/30/2022   Procedure: RIGHT/LEFT HEART CATH AND CORONARY ANGIOGRAPHY;  Surgeon: Rolan Ezra RAMAN, MD;  Location: [facility] INVASIVE CV LAB;  Service: Cardiovascular;  Laterality: N/A;   SPACE OAR INSTILLATION N/A 12/24/2017   Procedure: SPACE OAR INSTILLATION;  Surgeon: Nieves Cough, MD;  Location: [facility] ORS;  Service: Urology;  Laterality: N/A;   TOTAL KNEE ARTHROPLASTY Right 08/16/2017   Procedure: RIGHT TOTAL KNEE ARTHROPLASTY;  Surgeon: Shari Sieving, MD;  Location: [facility] OR;  Service: Orthopedics;  Laterality: Right;   UPPER ESOPHAGEAL ENDOSCOPIC ULTRASOUND (EUS) N/A 12/16/2020   Procedure: UPPER ESOPHAGEAL ENDOSCOPIC ULTRASOUND (EUS);  Surgeon: Rollin Dover, MD;  Location: [facility];  Service: Endoscopy;  Laterality: N/A;     Family History  Problem Relation Age of Onset   Hypertension Mother    Heart disease Mother    Diabetes Mother    Diabetes Father    Hypertension Father    Cancer Father        lung cancer    Healthy Sister    Heart attack Brother    Heart disease Brother 30       + tobacco   Healthy Brother      Social History   Socioeconomic History  Marital status: Married    Spouse name: Not on file   Number of children: 2   Years of education: Not on file   Highest education level: Not on file  Occupational History   Occupation: retired  Tobacco Use   Smoking status: Never    Passive exposure: Never   Smokeless tobacco: Never  Vaping Use   Vaping status: Never Used  Substance and Sexual Activity   Alcohol use: No   Drug use: No   Sexual activity: Not Currently  Other Topics Concern   Not on file  Social History Narrative   Retired Location manager. Married to Mrs. Carollee LOISE Budge. Daughter, Marko, lives in [place] . Son, Stark, lives in [place] .   Social Drivers of Corporate investment banker Strain: Low Risk  (12/26/2021)   Overall Financial Resource Strain (CARDIA)    Difficulty of Paying Living Expenses: Not very hard  Food Insecurity: No Food Insecurity (10/29/2022)   Hunger Vital Sign    Worried About Running Out of Food in the Last Year: Never true    Ran Out of Food in the Last Year: Never true  Transportation Needs: No Transportation Needs (10/29/2022)   PRAPARE - Administrator, Civil Service (Medical): No    Lack of Transportation (Non-Medical): No  Physical Activity: Insufficiently Active (09/26/2021)   Exercise Vital Sign    Days of Exercise per Week: 7 days    Minutes of Exercise per Session: 10 min  Stress: No Stress Concern Present (09/26/2021)   [employer] of Occupational Health - Occupational Stress Questionnaire    Feeling of Stress : Not at all  Social Connections: Not on file  Intimate Partner Violence: Not At Risk (10/29/2022)   Humiliation, Afraid, Rape, and Kick questionnaire    Fear of Current or Ex-Partner: No    Emotionally Abused: No    Physically Abused: No    Sexually Abused: No     BP 104/60   Ht 5' 6  (1.676 m)   Wt 160 lb 3.2 oz (72.7 kg)   BMI 25.86 kg/m   Physical Exam:  Well appearing NAD HEENT: Unremarkable Neck:  No JVD, no thyromegally Lymphatics:  No adenopathy Back:  No CVA tenderness Lungs:  Clear with no wheezes HEART:  IRegular rate rhythm, no murmurs, no rubs, no clicks Abd:  soft, positive bowel sounds, no organomegally, no rebound, no guarding Ext:  2 plus pulses, no edema, no cyanosis, no clubbing Skin:  No rashes no nodules Neuro:  CN II through XII intact, motor grossly intact    DEVICE  Normal device function.  See PaceArt for details.   Assess/Plan: Chronic systolic heart failure - I discussed the treatment options with the patient and his wife and offered him Biv upgrade. He has some risk with general anesthesia as well as another extraction. I plan to remove the atrial lead again, and insertion a new atrial and left bundle lead as his coronary venous anatomy is not conducive.  Stomach CA - no evidence of recurrence now over 2 year out from treatment.  CHB - this will make his procedure a bit more challenging.   Jon Tyrin Herbers,MD

## 2023-08-25 ENCOUNTER — Ambulatory Visit (HOSPITAL_COMMUNITY): Payer: Self-pay | Admitting: Cardiology

## 2023-08-26 ENCOUNTER — Other Ambulatory Visit (HOSPITAL_COMMUNITY): Payer: Self-pay | Admitting: Family Medicine

## 2023-08-26 ENCOUNTER — Other Ambulatory Visit (HOSPITAL_COMMUNITY): Payer: Self-pay

## 2023-08-26 MED ORDER — FUROSEMIDE 40 MG PO TABS
40.0000 mg | ORAL_TABLET | Freq: Every day | ORAL | 6 refills | Status: DC
  Filled 2023-09-02: qty 30, 30d supply, fill #0
  Filled 2023-09-23 – 2023-09-25 (×2): qty 30, 30d supply, fill #1

## 2023-08-28 ENCOUNTER — Encounter (HOSPITAL_COMMUNITY): Payer: Self-pay | Admitting: Pharmacy Technician

## 2023-08-28 ENCOUNTER — Emergency Department (HOSPITAL_COMMUNITY)
Admission: EM | Admit: 2023-08-28 | Discharge: 2023-08-29 | Discharge status: Against Medical Advice | Attending: Emergency Medicine | Admitting: Emergency Medicine

## 2023-08-28 ENCOUNTER — Other Ambulatory Visit (HOSPITAL_COMMUNITY): Payer: Self-pay | Admitting: Cardiology

## 2023-08-28 ENCOUNTER — Encounter: Payer: Self-pay | Admitting: Allergy

## 2023-08-28 ENCOUNTER — Encounter: Payer: Self-pay | Admitting: Hematology

## 2023-08-28 ENCOUNTER — Other Ambulatory Visit: Payer: Self-pay

## 2023-08-28 ENCOUNTER — Ambulatory Visit: Admitting: Allergy

## 2023-08-28 VITALS — BP 110/64 | HR 66 | Temp 98.0°F | Resp 18 | Ht 67.0 in | Wt 156.5 lb

## 2023-08-28 DIAGNOSIS — Z888 Allergy status to other drugs, medicaments and biological substances status: Secondary | ICD-10-CM

## 2023-08-28 DIAGNOSIS — R0902 Hypoxemia: Secondary | ICD-10-CM | POA: Diagnosis not present

## 2023-08-28 DIAGNOSIS — K219 Gastro-esophageal reflux disease without esophagitis: Secondary | ICD-10-CM | POA: Diagnosis not present

## 2023-08-28 DIAGNOSIS — T464X5D Adverse effect of angiotensin-converting-enzyme inhibitors, subsequent encounter: Secondary | ICD-10-CM

## 2023-08-28 DIAGNOSIS — J3489 Other specified disorders of nose and nasal sinuses: Secondary | ICD-10-CM

## 2023-08-28 DIAGNOSIS — R051 Acute cough: Secondary | ICD-10-CM | POA: Diagnosis not present

## 2023-08-28 DIAGNOSIS — R001 Bradycardia, unspecified: Secondary | ICD-10-CM | POA: Diagnosis not present

## 2023-08-28 DIAGNOSIS — T783XXD Angioneurotic edema, subsequent encounter: Secondary | ICD-10-CM

## 2023-08-28 DIAGNOSIS — I10 Essential (primary) hypertension: Secondary | ICD-10-CM | POA: Diagnosis not present

## 2023-08-28 DIAGNOSIS — R109 Unspecified abdominal pain: Secondary | ICD-10-CM | POA: Insufficient documentation

## 2023-08-28 DIAGNOSIS — R1084 Generalized abdominal pain: Secondary | ICD-10-CM | POA: Diagnosis not present

## 2023-08-28 DIAGNOSIS — Z79899 Other long term (current) drug therapy: Secondary | ICD-10-CM | POA: Insufficient documentation

## 2023-08-28 DIAGNOSIS — I959 Hypotension, unspecified: Secondary | ICD-10-CM | POA: Diagnosis not present

## 2023-08-28 DIAGNOSIS — R55 Syncope and collapse: Secondary | ICD-10-CM | POA: Diagnosis not present

## 2023-08-28 DIAGNOSIS — Z889 Allergy status to unspecified drugs, medicaments and biological substances status: Secondary | ICD-10-CM

## 2023-08-28 DIAGNOSIS — Z85028 Personal history of other malignant neoplasm of stomach: Secondary | ICD-10-CM | POA: Insufficient documentation

## 2023-08-28 DIAGNOSIS — R1013 Epigastric pain: Secondary | ICD-10-CM | POA: Diagnosis not present

## 2023-08-28 DIAGNOSIS — R197 Diarrhea, unspecified: Secondary | ICD-10-CM | POA: Diagnosis not present

## 2023-08-28 DIAGNOSIS — R0602 Shortness of breath: Secondary | ICD-10-CM | POA: Diagnosis not present

## 2023-08-28 LAB — COMPREHENSIVE METABOLIC PANEL WITH GFR
ALT: 19 U/L (ref 0–44)
AST: 22 U/L (ref 15–41)
Albumin: 3.8 g/dL (ref 3.5–5.0)
Alkaline Phosphatase: 44 U/L (ref 38–126)
Anion gap: 10 (ref 5–15)
BUN: 17 mg/dL (ref 8–23)
CO2: 23 mmol/L (ref 22–32)
Calcium: 9.3 mg/dL (ref 8.9–10.3)
Chloride: 99 mmol/L (ref 98–111)
Creatinine, Ser: 1.02 mg/dL (ref 0.61–1.24)
GFR, Estimated: >60 mL/min (ref >60)
Glucose, Bld: 92 mg/dL (ref 70–99)
Potassium: 4.3 mmol/L (ref 3.5–5.1)
Sodium: 132 mmol/L — ABNORMAL LOW (ref 135–145)
Total Bilirubin: 0.5 mg/dL (ref 0.0–1.2)
Total Protein: 7.1 g/dL (ref 6.5–8.1)

## 2023-08-28 LAB — URINALYSIS, ROUTINE W REFLEX MICROSCOPIC
Bilirubin Urine: NEGATIVE
Glucose, UA: 150 mg/dL — AB
Hgb urine dipstick: NEGATIVE
Ketones, ur: NEGATIVE mg/dL
Leukocytes,Ua: NEGATIVE
Nitrite: NEGATIVE
Protein, ur: NEGATIVE mg/dL
Specific Gravity, Urine: 1.006 (ref 1.005–1.030)
pH: 5 (ref 5.0–8.0)

## 2023-08-28 LAB — CBC WITH DIFFERENTIAL/PLATELET
Abs Immature Granulocytes: 0.01 K/uL (ref 0.00–0.07)
Basophils Absolute: 0 K/uL (ref 0.0–0.1)
Basophils Relative: 0 %
Eosinophils Absolute: 0.2 K/uL (ref 0.0–0.5)
Eosinophils Relative: 3 %
HCT: 32 % — ABNORMAL LOW (ref 39.0–52.0)
Hemoglobin: 10.2 g/dL — ABNORMAL LOW (ref 13.0–17.0)
Immature Granulocytes: 0 %
Lymphocytes Relative: 16 %
Lymphs Abs: 1.2 K/uL (ref 0.7–4.0)
MCH: 29.7 pg (ref 26.0–34.0)
MCHC: 31.9 g/dL (ref 30.0–36.0)
MCV: 93.3 fL (ref 80.0–100.0)
Monocytes Absolute: 0.6 K/uL (ref 0.1–1.0)
Monocytes Relative: 8 %
Neutro Abs: 5.5 K/uL (ref 1.7–7.7)
Neutrophils Relative %: 73 %
Platelets: 359 K/uL (ref 150–400)
RBC: 3.43 MIL/uL — ABNORMAL LOW (ref 4.22–5.81)
RDW: 14.9 % (ref 11.5–15.5)
WBC: 7.5 K/uL (ref 4.0–10.5)
nRBC: 0 % (ref 0.0–0.2)

## 2023-08-28 LAB — LIPASE, BLOOD: Lipase: 47 U/L (ref 11–51)

## 2023-08-28 LAB — DIGOXIN LEVEL: Digoxin Level: 0.6 ng/mL — ABNORMAL LOW (ref 0.8–2.0)

## 2023-08-28 MED ORDER — ONDANSETRON HCL 4 MG/2ML IJ SOLN
4.0000 mg | Freq: Once | INTRAMUSCULAR | Status: DC
Start: 2023-08-28 — End: 2023-08-29

## 2023-08-28 MED ORDER — MORPHINE SULFATE (PF) 4 MG/ML IV SOLN: 4.0000 mg | Freq: Once | INTRAVENOUS | Status: DC

## 2023-08-28 NOTE — Progress Notes (Deleted)
 Follow Up Note  RE: Jon Williams MRN: [id number] DOB: 03-19-1950 Date of Office Visit: 08/28/2023  Referring provider: [name], MD Primary care provider: [name], MD  Chief Complaint: Follow-up (Patient is waking up with a lot of mucus that makes him nauseous. Eyes and nose still run a little but not as much. )  History of Present Illness: I had the pleasure of seeing Jon Williams for a follow up visit at the [facility] on 08/28/2023. He is a 73 y.o. male, who is being followed for angioedema, allergic reaction, multiple drug allergies and rhinorrhea. His previous allergy  office visit was on 07/31/2023 with Dr. [name]. Today is a regular follow up visit. He is accompanied today by his spouse who provided/contributed to the history.   Discussed the use of AI scribe software for clinical note transcription with the patient, who gave verbal consent to proceed.  History of Present Illness   He has experienced episodes of swelling, which resolved after changing his blood pressure medication. He was previously on an ACE inhibitor. After switching to valsartan , he has not had further episodes of swelling. His blood pressure is well-controlled on valsartan .  He uses Atrovent  nasal spray for nasal drainage which he finds helpful.   He experiences symptoms suggestive of reflux, such as bad breath and an unpleasant taste in his mouth. He does not take medication for reflux but manages it with lifestyle modifications, including avoiding spicy foods. He uses cough drops to help with the taste in his mouth.        2025 labs: Thyroid , chronic urticaria index (checks for autoantibodies that trigger mast cells), tryptase (checks for mast cell issues), angioedema panel (checks for certain swelling issues) urine test, and alpha gal (checks for red meat allergy ) were all normal which is great.    Negative to food panel.    Inflammation markers slightly elevated -  continue to monitor for now.  Autoimmune screener was positive with 1:80 speckled pattern. We can discuss at next visit whether a rheumatology referral would be beneficial or not for further work up.    Based on these labs - no underlying issues. I recommend to avoid all ace-inhibitors as I'm suspicious that's what caused the swelling episode.   Assessment and Plan: Jon Williams is a 73 y.o. male with: Angioedema, initial encounter Allergic reaction, initial encounter Facial swelling with no clear trigger. Unlikely to be due to IV conrast due to timeline. 2 other episodes of angioedema in the remote past and it is unclear if he was on any ace inhibitor medications at that time. History of prostate and gastric cancer. Angioedema may be isolated or associated with urticaria. It may be histamine-induced (i.e. foods or certain medications) or bradykinin-mediated (i.e. Hereditary Angioedema or ACE-I). Patients with histamine-induced angioedema are more likely to have associated symptoms of urticaria, pruritus or signs of anaphylaxis. Patients with bradykinin-mediated do not have any symptoms of pruritus or urticaria and they did not improve with Benadryl  or Epinephrine . ACE-inhibitors are notorious to cause angioedema and has an incidence of 0.1-0.7% and orolingual involvement is common. ACE is involved in the degradation of bradykinin and blocking it can cause increase in bradykinin resulting in angioedema. Avoid all ACE-inhibitors in the future. Try to avoid NSAIDs if possible as this class has been known to lower threshold for angioedema.  Please call your cardiologist regarding stopping ENTRESTO  (which contains an ace-inhibitor type of medications).  Get bloodwork to rule out any other  systemic issues.  I have prescribed epinephrine  injectable device and demonstrated proper use. For mild symptoms you can take over the counter antihistamines (zyrtec  10mg  to 20mg ) and monitor symptoms closely.  If symptoms  worsen or if you have severe symptoms including breathing issues, throat closure, significant swelling, whole body hives, severe diarrhea and vomiting, lightheadedness then use epinephrine  and seek immediate medical care afterwards. Emergency action plan given.   Multiple drug allergies Avoid aspirin  and sulfa   Rhinorrhea Use Atrovent  (ipratropium) 0.03% 1-2 sprays per nostril twice a day as needed for runny nose/drainage. Assessment and Plan    Allergy  to ACE inhibitors Swelling episodes resolved after discontinuing ACE inhibitor and switching to valsartan . - Avoid all ACE inhibitors. - Monitor for further swelling episodes. - Do not refill EpiPen  if no further episodes occur.  Hypertension Well-controlled with valsartan . Blood pressure within normal range.  Gastroesophageal reflux disease (GERD) Symptoms likely due to lifestyle factors. - Continue lifestyle modifications. - Consult primary care if symptoms persist or worsen.         Return if symptoms worsen or fail to improve.  No orders of the defined types were placed in this encounter.  Lab Orders  No laboratory test(s) ordered today    Diagnostics: Spirometry:  Tracings reviewed. His effort: {Blank single:19197::Good reproducible efforts.,It was hard to get consistent efforts and there is a question as to whether this reflects a maximal maneuver.,Poor effort, data can not be interpreted.} FVC: ***L FEV1: ***L, ***% predicted FEV1/FVC ratio: ***% Interpretation: {Blank single:19197::Spirometry consistent with mild obstructive disease,Spirometry consistent with moderate obstructive disease,Spirometry consistent with severe obstructive disease,Spirometry consistent with possible restrictive disease,Spirometry consistent with mixed obstructive and restrictive disease,Spirometry uninterpretable due to technique,Spirometry consistent with normal pattern,No overt abnormalities noted given today's  efforts}.  Please see scanned spirometry results for details.  Skin Testing: {Blank single:19197::Select foods,Environmental allergy  panel,Environmental allergy  panel and select foods,Food allergy  panel,None,Deferred due to recent antihistamines use}. *** Results discussed with patient/family.   Medication List:  Current Outpatient Medications  Medication Sig Dispense Refill   Accu-Chek Softclix Lancets lancets Use to check your blood sugar finger stick once a day Dx Code E11.9 90 days 100 each 3   atorvastatin  (LIPITOR) 40 MG tablet Take 1 tablet (40 mg total) by mouth daily. 90 tablet 3   Biotin 1000 MCG tablet Take 1,000 mcg by mouth daily with breakfast.     Blood Glucose Monitoring Suppl (ACCU-CHEK GUIDE) w/Device KIT as directed to check blood sugar once a day 1 kit 0   cholecalciferol  (VITAMIN D3) 25 MCG (1000 UT) tablet Take 1,000 Units by mouth daily with breakfast.     digoxin  (LANOXIN ) 0.125 MG tablet Take 1/2 tablet (0.0625mg  total) by mouth daily. 15 tablet 6   empagliflozin  (JARDIANCE ) 10 MG TABS tablet Take 1 tablet (10 mg total) by mouth daily. 90 tablet 3   EPINEPHrine  0.3 mg/0.3 mL IJ SOAJ injection Inject 0.3 mg into the muscle as needed for anaphylaxis. 2 each 1   famotidine  (PEPCID ) 20 MG tablet Take 1 tablet (20 mg total) by mouth once a day at bedtime as needed. 30 tablet 3   ferrous sulfate 325 (65 FE) MG tablet Take 325 mg by mouth daily with breakfast.     furosemide  (LASIX ) 40 MG tablet Take 1 tablet (40 mg total) by mouth daily. 30 tablet 6   gabapentin  (NEURONTIN ) 100 MG capsule Take 1 capsule (100 mg total) by mouth 2 (two) times daily. 180 capsule 1   glucose  blood (ACCU-CHEK GUIDE TEST) test strip Use to check blood sugars once daily. 100 each 3   HYDROcodone -acetaminophen  (NORCO/VICODIN) 5-325 MG tablet Take 1 tablet by mouth every 4 (four) hours as needed. 5 tablet 0   ipratropium (ATROVENT ) 0.03 % nasal spray Place 1-2 sprays into both nostrils  2 (two) times daily as needed (nasal drainage). 30 mL 2   ketoconazole  (NIZORAL ) 2 % cream Apply 1 Application topically 2 (two) times daily. 30 g 3   lidocaine -prilocaine  (EMLA ) cream Apply 1 Application topically as needed. 30 g 0   loratadine  (CLARITIN ) 10 MG tablet Take 10 mg by mouth daily.     metFORMIN  (GLUCOPHAGE -XR) 500 MG 24 hr tablet Take 1 tablet (500 mg total) by mouth with evening meal 90 tablet 3   metoprolol  succinate (TOPROL -XL) 25 MG 24 hr tablet Take 1 tablet (25 mg total) by mouth daily. 90 tablet 3   multivitamin (ONE-A-DAY MEN'S) TABS tablet Take 1 tablet by mouth daily with breakfast.     Naftifine HCl (NAFTIN) 2 % GEL Apply 1 application  topically every Monday, Wednesday, and Friday.     pantoprazole  (PROTONIX ) 40 MG tablet Take 1 tablet (40 mg total) by mouth 2 (two) times daily. 180 tablet 3   potassium chloride  (KLOR-CON ) 10 MEQ tablet Take 2 tablets (20 mEq total) by mouth daily. 60 tablet 3   rivaroxaban  (XARELTO ) 20 MG TABS tablet Take 1 tablet (20 mg total) by mouth daily with supper. 90 tablet 3   spironolactone  (ALDACTONE ) 25 MG tablet Take 1 tablet (25 mg total) by mouth daily. 90 tablet 3   sucralfate  (CARAFATE ) 1 g tablet Take 1 tablet (1 g total) by mouth with breakfast, with lunch, and with evening meal for 7 days. 21 tablet 0   Tavaborole 5 % SOLN Apply 1 application  topically daily as needed (Nail on left hand).     valsartan  (DIOVAN ) 40 MG tablet Take 1 tablet (40 mg total) by mouth 2 (two) times daily. 60 tablet 11   No current facility-administered medications for this visit.   Allergies: Allergies  Allergen Reactions   Aspirin  Anaphylaxis and Hives   Sacubitril -Valsartan  Swelling    lip swelling, angioedema   Sulfa Antibiotics Anaphylaxis, Hives, Swelling and Other (See Comments)    Swollen lips   Ace Inhibitors     Possible angioedema   I reviewed his past medical history, social history, family history, and environmental history and no  significant changes have been reported from his previous visit.  Review of Systems  Constitutional:  Negative for appetite change, chills, fever and unexpected weight change.  HENT:  Negative for congestion and rhinorrhea.        Facial/lip swelling  Eyes:  Negative for itching.  Respiratory:  Negative for cough, chest tightness, shortness of breath and wheezing.   Cardiovascular:  Negative for chest pain.  Gastrointestinal:  Negative for abdominal pain.  Genitourinary:  Negative for difficulty urinating.  Skin:  Negative for rash.  Neurological:  Negative for headaches.    Objective: BP 110/64 (BP Location: Left Arm, Patient Position: Sitting, Cuff Size: Normal)   Pulse 66   Temp 98 F (36.7 C) (Temporal)   Resp 18   Ht 5' 7 (1.702 m)   Wt 156 lb 8 oz (71 kg)   SpO2 100%   BMI 24.51 kg/m  Body mass index is 24.51 kg/m. Physical Exam Previous notes and tests were reviewed. The plan was reviewed with the patient/family, and all questions/concerned  were addressed.  It was my pleasure to see Celester today and participate in his care. Please feel free to contact me with any questions or concerns.  Sincerely,  Orlan Cramp, DO Allergy  & Immunology  [facility]  [place] office: [phone] [facility] office: [phone]

## 2023-08-28 NOTE — Patient Instructions (Addendum)
 Swelling episodes Monitor symptoms.  Avoid all ACE-inhibitors in the future.   For mild symptoms you can take over the counter antihistamines (zyrtec 10mg  to 20mg ) and monitor symptoms closely.  If symptoms worsen or if you have severe symptoms including breathing issues, throat closure, significant swelling, whole body hives, severe diarrhea and vomiting, lightheadedness then use epinephrine  and seek immediate medical care afterwards. If you have no additional episodes and don't have to use the Epipen  and it expires - no need to refill in the future.  Emergency action plan in place.   Medication allergies Avoid aspirin , sulfa.  Avoid ace inhibitors.   Runny nose Use Atrovent  (ipratropium) 0.03% 1-2 sprays per nostril twice a day as needed for runny nose/drainage.  Reflux See handout for lifestyle and dietary modifications. Follow up with PCP if symptoms worsen.   Follow up as needed.

## 2023-08-28 NOTE — ED Provider Triage Note (Signed)
 Emergency Medicine Provider Triage Evaluation Note  Jon Williams , a 73 y.o. male  was evaluated in triage.  Pt complains of abd pain.  Pain has been intermittent for about a month.  He does have a hx of gastric cancer.  He was seen in the ED on 7/7 for the same and was thought to have a possible kidney stone.  Pt d/c with lortab, carafate , protonix  and it is not helping.  Pain comes and goes.  Review of Systems  Positive: Abd pain Negative: Vomiting, fever  Physical Exam  BP (!) 137/105 (BP Location: Right Arm)   Pulse (!) 115   Temp 97.6 F (36.4 C) (Oral)   Resp 16   SpO2 100%  Gen:   Awake, no distress   Resp:  Normal effort  MSK:   Moves extremities without difficulty  Other:  Epigastric abd tenderness  Medical Decision Making  Medically screening exam initiated at 2:50 PM.  Appropriate orders placed.  Jon Williams was informed that the remainder of the evaluation will be completed by another provider, this initial triage assessment does not replace that evaluation, and the importance of remaining in the ED until their evaluation is complete.  Labs placed for abd pain + dig level.  IV morphine  and zofran  ordered for sx.  CT abd/pelvis placed as sx have worsened.   Jon Clarity, MD 08/28/23 1455

## 2023-08-28 NOTE — ED Triage Notes (Signed)
 Pt here with ongoing abdominal pain. Seen multiple times for the same. States today pain got worse. Pain in epigastric area, described as burning.

## 2023-08-28 NOTE — Progress Notes (Signed)
 Follow Up Note  RE: Jon Williams MRN: [id number] DOB: 1950/12/25 Date of Office Visit: 08/28/2023  Referring provider: [name], MD Primary care provider: [name], MD  Chief Complaint: Follow-up (Patient is waking up with a lot of mucus that makes him nauseous. Eyes and nose still run a little but not as much. )  History of Present Illness: I had the pleasure of seeing Jon Williams for a follow up visit at the [facility] on 08/28/2023. He is a 73 y.o. male, who is being followed for angioedema, allergic reaction, multiple drug allergies and rhinorrhea. His previous allergy  office visit was on 07/31/2023 with Dr. [name]. Today is a regular follow up visit.  Discussed the use of AI scribe software for clinical note transcription with the patient, who gave verbal consent to proceed.    Jon Williams presents in follow up after having an episode of lip and tongue swelling in early June. At that time he was on Entresto  and since then he has been switched to Valsartan . He has not had an episode of swelling since. His BP has been stable on the new medication.   He also complained of a runny nose and has been using ipratropium nasal spray twice daily, which has significantly helped his drainage. Jon Williams is satisfied with how well this helped and would like to continue.  The patient notes that he has had some morning bitter taste in his mouth. He inquires if this is related to the medications that he takes.    2025 labs: Thyroid , chronic urticaria index (checks for autoantibodies that trigger mast cells), tryptase (checks for mast cell issues), angioedema panel (checks for certain swelling issues) urine test, and alpha gal (checks for red meat allergy ) were all normal which is great.    Negative to food panel.    Inflammation markers slightly elevated - continue to monitor for now.  Autoimmune screener was positive with 1:80 speckled pattern. We can discuss at next  visit whether a rheumatology referral would be beneficial or not for further work up.    Based on these labs - no underlying issues. I recommend to avoid all ace-inhibitors as I'm suspicious that's what caused the swelling episode.   Assessment and Plan: Jon Williams is a 73 y.o. male with: Angiotensin converting enzyme inhibitor-aggravated angioedema, subsequent encounter Past history - Facial swelling with no clear trigger. Unlikely to be due to IV conrast due to timeline. 2 other episodes of angioedema in the remote past and it is unclear if he was on any ace inhibitor medications at that time. History of prostate and gastric cancer. Interim history - Swelling episodes resolved after discontinuing ACE inhibitor and switching to valsartan . Swelling episodes Monitor symptoms.  Avoid all ACE-inhibitors in the future. For mild symptoms you can take over the counter antihistamines (zyrtec 10mg  to 20mg ) and monitor symptoms closely. If symptoms worsen or if you have severe symptoms including breathing issues, throat closure, significant swelling, whole body hives, severe diarrhea and vomiting, lightheadedness then use epinephrine  and seek immediate medical care afterwards. If you have no additional episodes and don't have to use the Epipen  and it expires - no need to refill in the future.  Emergency action plan in place.   Rhinorrhea Improved with nasal spray. Use Atrovent  (ipratropium) 0.03% 1-2 sprays per nostril twice a day as needed for runny nose/drainage.  Multiple drug allergies Avoid aspirin , sulfa.  Avoid ace inhibitors.   Possible GERD See handout for lifestyle and  dietary modifications. Follow up with PCP if symptoms worsen.    Return if symptoms worsen or fail to improve.  No orders of the defined types were placed in this encounter.  Lab Orders  No laboratory test(s) ordered today    Diagnostics: None  Medication List:  Current Outpatient Medications  Medication Sig  Dispense Refill   Accu-Chek Softclix Lancets lancets Use to check your blood sugar finger stick once a day Dx Code E11.9 90 days 100 each 3   atorvastatin  (LIPITOR) 40 MG tablet Take 1 tablet (40 mg total) by mouth daily. 90 tablet 3   Biotin 1000 MCG tablet Take 1,000 mcg by mouth daily with breakfast.     Blood Glucose Monitoring Suppl (ACCU-CHEK GUIDE) w/Device KIT as directed to check blood sugar once a day 1 kit 0   cholecalciferol  (VITAMIN D3) 25 MCG (1000 UT) tablet Take 1,000 Units by mouth daily with breakfast.     digoxin  (LANOXIN ) 0.125 MG tablet Take 1/2 tablet (0.0625mg  total) by mouth daily. 15 tablet 6   empagliflozin  (JARDIANCE ) 10 MG TABS tablet Take 1 tablet (10 mg total) by mouth daily. 90 tablet 3   EPINEPHrine  0.3 mg/0.3 mL IJ SOAJ injection Inject 0.3 mg into the muscle as needed for anaphylaxis. 2 each 1   famotidine  (PEPCID ) 20 MG tablet Take 1 tablet (20 mg total) by mouth once a day at bedtime as needed. 30 tablet 3   ferrous sulfate 325 (65 FE) MG tablet Take 325 mg by mouth daily with breakfast.     furosemide  (LASIX ) 40 MG tablet Take 1 tablet (40 mg total) by mouth daily. 30 tablet 6   gabapentin  (NEURONTIN ) 100 MG capsule Take 1 capsule (100 mg total) by mouth 2 (two) times daily. 180 capsule 1   glucose blood (ACCU-CHEK GUIDE TEST) test strip Use to check blood sugars once daily. 100 each 3   HYDROcodone -acetaminophen  (NORCO/VICODIN) 5-325 MG tablet Take 1 tablet by mouth every 4 (four) hours as needed. 5 tablet 0   ipratropium (ATROVENT ) 0.03 % nasal spray Place 1-2 sprays into both nostrils 2 (two) times daily as needed (nasal drainage). 30 mL 2   ketoconazole  (NIZORAL ) 2 % cream Apply 1 Application topically 2 (two) times daily. 30 g 3   lidocaine -prilocaine  (EMLA ) cream Apply 1 Application topically as needed. 30 g 0   loratadine  (CLARITIN ) 10 MG tablet Take 10 mg by mouth daily.     metFORMIN  (GLUCOPHAGE -XR) 500 MG 24 hr tablet Take 1 tablet (500 mg total) by  mouth with evening meal 90 tablet 3   metoprolol  succinate (TOPROL -XL) 25 MG 24 hr tablet Take 1 tablet (25 mg total) by mouth daily. 90 tablet 3   multivitamin (ONE-A-DAY MEN'S) TABS tablet Take 1 tablet by mouth daily with breakfast.     Naftifine HCl (NAFTIN) 2 % GEL Apply 1 application  topically every Monday, Wednesday, and Friday.     pantoprazole  (PROTONIX ) 40 MG tablet Take 1 tablet (40 mg total) by mouth 2 (two) times daily. 180 tablet 3   potassium chloride  (KLOR-CON ) 10 MEQ tablet Take 2 tablets (20 mEq total) by mouth daily. 60 tablet 3   rivaroxaban  (XARELTO ) 20 MG TABS tablet Take 1 tablet (20 mg total) by mouth daily with supper. 90 tablet 3   spironolactone  (ALDACTONE ) 25 MG tablet Take 1 tablet (25 mg total) by mouth daily. 90 tablet 3   sucralfate  (CARAFATE ) 1 g tablet Take 1 tablet (1 g total) by mouth with  breakfast, with lunch, and with evening meal for 7 days. 21 tablet 0   Tavaborole 5 % SOLN Apply 1 application  topically daily as needed (Nail on left hand).     valsartan  (DIOVAN ) 40 MG tablet Take 1 tablet (40 mg total) by mouth 2 (two) times daily. 60 tablet 11   No current facility-administered medications for this visit.   Allergies: Allergies  Allergen Reactions   Aspirin  Anaphylaxis and Hives   Sacubitril -Valsartan  Swelling    lip swelling, angioedema   Sulfa Antibiotics Anaphylaxis, Hives, Swelling and Other (See Comments)    Swollen lips   Ace Inhibitors     Possible angioedema   I reviewed his past medical history, social history, family history, and environmental history and no significant changes have been reported from his previous visit.  Review of Systems  Constitutional:  Negative for appetite change, chills, fever and unexpected weight change.  HENT:  Positive for postnasal drip (resolved) and rhinorrhea (resolved). Negative for congestion and voice change.        Facial/lip swelling (resolved), bitter taste in the mouth in the morning  Eyes:   Negative for itching.  Respiratory:  Negative for cough, chest tightness, shortness of breath and wheezing.   Cardiovascular:  Negative for chest pain.  Gastrointestinal:  Negative for abdominal pain.  Genitourinary:  Negative for difficulty urinating.  Skin:  Negative for rash.  Neurological:  Negative for headaches.    Objective: BP 110/64 (BP Location: Left Arm, Patient Position: Sitting, Cuff Size: Normal)   Pulse 66   Temp 98 F (36.7 C) (Temporal)   Resp 18   Ht 5' 7 (1.702 m)   Wt 156 lb 8 oz (71 kg)   SpO2 100%   BMI 24.51 kg/m  Body mass index is 24.51 kg/m. Physical Exam Constitutional:      Appearance: Normal appearance.  HENT:     Head: Normocephalic.     Right Ear: Tympanic membrane normal.     Left Ear: Tympanic membrane normal.     Nose: Nose normal.     Comments: Normal to external appearance    Mouth/Throat:     Mouth: Mucous membranes are moist.     Comments: No cobblestoning of the posterior oropharynx Cardiovascular:     Rate and Rhythm: Normal rate and regular rhythm.  Pulmonary:     Effort: Pulmonary effort is normal.  Musculoskeletal:        General: Normal range of motion.     Cervical back: Normal range of motion.  Skin:    General: Skin is warm.  Neurological:     Mental Status: He is alert and oriented to person, place, and time. Mental status is at baseline.  Psychiatric:        Mood and Affect: Mood normal.        Behavior: Behavior normal.    Previous notes and tests were reviewed. The plan was reviewed with the patient/family, and all questions/concerned were addressed.  Sincerely,   Donnice Mutter, MS4 [facility]   I have provided oversight concerning Donnice Mutter, MSIV evaluation and treatment of this patient's heath issues addressed during today's encounter. We saw the patient together, performed physical exam together as well. I reviewed and added to the notes regarding the assessment and plan as  outlined in the note above.  It was my pleasure to see Antwain today and participate in his care. Please feel free to contact me with any questions or  concerns.  Sincerely,  Orlan Cramp, DO Allergy  & Immunology  [facility]  [facility] office: [phone] [facility] office: [phone]

## 2023-08-29 NOTE — ED Notes (Signed)
 Pt and his wife stated that they were leaving due to waiting to long.

## 2023-08-30 ENCOUNTER — Emergency Department (HOSPITAL_COMMUNITY): Admission: EM | Admit: 2023-08-30 | Discharge: 2023-08-30 | Disposition: A | Discharge status: Home / Self Care

## 2023-08-30 ENCOUNTER — Encounter (HOSPITAL_COMMUNITY): Payer: Self-pay | Admitting: *Deleted

## 2023-08-30 ENCOUNTER — Emergency Department (HOSPITAL_COMMUNITY)

## 2023-08-30 ENCOUNTER — Other Ambulatory Visit: Payer: Self-pay

## 2023-08-30 DIAGNOSIS — R1084 Generalized abdominal pain: Secondary | ICD-10-CM | POA: Diagnosis not present

## 2023-08-30 DIAGNOSIS — R0602 Shortness of breath: Secondary | ICD-10-CM | POA: Diagnosis not present

## 2023-08-30 DIAGNOSIS — C169 Malignant neoplasm of stomach, unspecified: Secondary | ICD-10-CM | POA: Diagnosis not present

## 2023-08-30 DIAGNOSIS — R059 Cough, unspecified: Secondary | ICD-10-CM | POA: Diagnosis not present

## 2023-08-30 DIAGNOSIS — R109 Unspecified abdominal pain: Secondary | ICD-10-CM | POA: Diagnosis present

## 2023-08-30 DIAGNOSIS — K224 Dyskinesia of esophagus: Secondary | ICD-10-CM | POA: Diagnosis not present

## 2023-08-30 DIAGNOSIS — Z7901 Long term (current) use of anticoagulants: Secondary | ICD-10-CM | POA: Diagnosis not present

## 2023-08-30 DIAGNOSIS — K7689 Other specified diseases of liver: Secondary | ICD-10-CM | POA: Diagnosis not present

## 2023-08-30 DIAGNOSIS — D3502 Benign neoplasm of left adrenal gland: Secondary | ICD-10-CM | POA: Diagnosis not present

## 2023-08-30 DIAGNOSIS — I517 Cardiomegaly: Secondary | ICD-10-CM | POA: Diagnosis not present

## 2023-08-30 DIAGNOSIS — R1013 Epigastric pain: Secondary | ICD-10-CM | POA: Diagnosis not present

## 2023-08-30 LAB — CBC WITH DIFFERENTIAL/PLATELET
Abs Immature Granulocytes: 0.03 K/uL (ref 0.00–0.07)
Basophils Absolute: 0 K/uL (ref 0.0–0.1)
Basophils Relative: 0 %
Eosinophils Absolute: 0.2 K/uL (ref 0.0–0.5)
Eosinophils Relative: 2 %
HCT: 31.2 % — ABNORMAL LOW (ref 39.0–52.0)
Hemoglobin: 9.6 g/dL — ABNORMAL LOW (ref 13.0–17.0)
Immature Granulocytes: 0 %
Lymphocytes Relative: 7 %
Lymphs Abs: 0.6 K/uL — ABNORMAL LOW (ref 0.7–4.0)
MCH: 29.5 pg (ref 26.0–34.0)
MCHC: 30.8 g/dL (ref 30.0–36.0)
MCV: 96 fL (ref 80.0–100.0)
Monocytes Absolute: 0.7 K/uL (ref 0.1–1.0)
Monocytes Relative: 8 %
Neutro Abs: 7.1 K/uL (ref 1.7–7.7)
Neutrophils Relative %: 83 %
Platelets: 350 K/uL (ref 150–400)
RBC: 3.25 MIL/uL — ABNORMAL LOW (ref 4.22–5.81)
RDW: 15 % (ref 11.5–15.5)
WBC: 8.6 K/uL (ref 4.0–10.5)
nRBC: 0 % (ref 0.0–0.2)

## 2023-08-30 LAB — BASIC METABOLIC PANEL WITH GFR
Anion gap: 12 (ref 5–15)
BUN: 26 mg/dL — ABNORMAL HIGH (ref 8–23)
CO2: 18 mmol/L — ABNORMAL LOW (ref 22–32)
Calcium: 9.2 mg/dL (ref 8.9–10.3)
Chloride: 100 mmol/L (ref 98–111)
Creatinine, Ser: 1.09 mg/dL (ref 0.61–1.24)
GFR, Estimated: >60 mL/min (ref >60)
Glucose, Bld: 98 mg/dL (ref 70–99)
Potassium: 4 mmol/L (ref 3.5–5.1)
Sodium: 130 mmol/L — ABNORMAL LOW (ref 135–145)

## 2023-08-30 LAB — HEPATIC FUNCTION PANEL
ALT: 18 U/L (ref 0–44)
AST: 21 U/L (ref 15–41)
Albumin: 3.5 g/dL (ref 3.5–5.0)
Alkaline Phosphatase: 42 U/L (ref 38–126)
Bilirubin, Direct: 0.1 mg/dL (ref 0.0–0.2)
Indirect Bilirubin: 0.3 mg/dL (ref 0.3–0.9)
Total Bilirubin: 0.4 mg/dL (ref 0.0–1.2)
Total Protein: 6.9 g/dL (ref 6.5–8.1)

## 2023-08-30 LAB — URINALYSIS, ROUTINE W REFLEX MICROSCOPIC
Bacteria, UA: NONE SEEN
Bilirubin Urine: NEGATIVE
Glucose, UA: >=500 mg/dL — AB
Hgb urine dipstick: NEGATIVE
Ketones, ur: NEGATIVE mg/dL
Leukocytes,Ua: NEGATIVE
Nitrite: NEGATIVE
Protein, ur: NEGATIVE mg/dL
Specific Gravity, Urine: 1.018 (ref 1.005–1.030)
pH: 5 (ref 5.0–8.0)

## 2023-08-30 LAB — RESP PANEL BY RT-PCR (RSV, FLU A&B, COVID)  RVPGX2
Influenza A by PCR: NEGATIVE
Influenza B by PCR: NEGATIVE
Resp Syncytial Virus by PCR: NEGATIVE
SARS Coronavirus 2 by RT PCR: NEGATIVE

## 2023-08-30 LAB — LIPASE, BLOOD: Lipase: 41 U/L (ref 11–51)

## 2023-08-30 LAB — TROPONIN I (HIGH SENSITIVITY)
Troponin I (High Sensitivity): 4 ng/L (ref <18)
Troponin I (High Sensitivity): 4 ng/L (ref <18)

## 2023-08-30 MED ORDER — ALUM & MAG HYDROXIDE-SIMETH 200-200-20 MG/5ML PO SUSP
30.0000 mL | Freq: Once | ORAL | Status: AC
  Administered 2023-08-30: 30 mL via ORAL
  Filled 2023-08-30: qty 30

## 2023-08-30 MED ORDER — FAMOTIDINE 40 MG PO TABS: 40.0000 mg | ORAL_TABLET | Freq: Every day | ORAL | 0 refills | Status: DC

## 2023-08-30 MED ORDER — HYDROMORPHONE HCL 1 MG/ML IJ SOLN
1.0000 mg | Freq: Once | INTRAMUSCULAR | Status: AC
  Administered 2023-08-30: 1 mg via INTRAVENOUS
  Filled 2023-08-30: qty 1

## 2023-08-30 MED ORDER — PANTOPRAZOLE SODIUM 40 MG IV SOLR
40.0000 mg | Freq: Once | INTRAVENOUS | Status: AC
  Administered 2023-08-30: 40 mg via INTRAVENOUS
  Filled 2023-08-30: qty 10

## 2023-08-30 MED ORDER — FAMOTIDINE 20 MG PO TABS
20.0000 mg | ORAL_TABLET | Freq: Once | ORAL | Status: AC
  Administered 2023-08-30: 20 mg via ORAL
  Filled 2023-08-30: qty 1

## 2023-08-30 MED ORDER — HYDROCODONE BIT-HOMATROP MBR 5-1.5 MG/5ML PO SOLN
5.0000 mL | Freq: Once | ORAL | Status: AC
  Administered 2023-08-30: 5 mL via ORAL
  Filled 2023-08-30: qty 5

## 2023-08-30 MED ORDER — IOHEXOL 350 MG/ML SOLN
75.0000 mL | Freq: Once | INTRAVENOUS | Status: AC | PRN
Start: 2023-08-30 — End: 2023-08-30
  Administered 2023-08-30: 75 mL via INTRAVENOUS

## 2023-08-30 MED ORDER — SUCRALFATE 1 G PO TABS
1.0000 g | ORAL_TABLET | Freq: Once | ORAL | Status: AC
  Administered 2023-08-30: 1 g via ORAL
  Filled 2023-08-30: qty 1

## 2023-08-30 MED ORDER — ACETAMINOPHEN 500 MG PO TABS
1000.0000 mg | ORAL_TABLET | Freq: Once | ORAL | Status: AC
  Administered 2023-08-30: 1000 mg via ORAL
  Filled 2023-08-30: qty 2

## 2023-08-30 MED ORDER — SODIUM CHLORIDE 0.9 % IV BOLUS
500.0000 mL | Freq: Once | INTRAVENOUS | Status: AC
  Administered 2023-08-30: 500 mL via INTRAVENOUS

## 2023-08-30 MED ORDER — ALUM & MAG HYDROXIDE-SIMETH 200-200-20 MG/5ML PO SUSP
15.0000 mL | Freq: Once | ORAL | Status: AC
  Administered 2023-08-30: 15 mL via ORAL
  Filled 2023-08-30: qty 30

## 2023-08-30 MED ORDER — CETIRIZINE HCL 10 MG PO TABS: 10.0000 mg | ORAL_TABLET | Freq: Every day | ORAL | 0 refills | Status: AC

## 2023-08-30 NOTE — ED Provider Notes (Signed)
 [place] EMERGENCY DEPARTMENT AT [facility] Provider Note   CSN: [id number] Arrival date & time: 08/30/23  9145     Patient presents with: Abdominal Pain   Jon Williams is a 73 y.o. male.   73 year old male presents for multiple complaints.  States he had abdominal pain for last few days.  Was here a few days ago diagnosed with a kidney stone.  States abdominal pain is different than them.  He also admits to cough and had cold sensation.  States he has had increasing sputum production as well.  Denies any other symptoms or concerns at this time.   Abdominal Pain Associated symptoms: cough and shortness of breath   Associated symptoms: no chest pain, no chills, no dysuria, no fever, no hematuria, no sore throat and no vomiting        Prior to Admission medications   Medication Sig Start Date End Date Taking? Authorizing Provider  Accu-Chek Softclix Lancets lancets Use to check your blood sugar finger stick once a day Dx Code E11.9 90 days 05/27/23     atorvastatin  (LIPITOR) 40 MG tablet Take 1 tablet (40 mg total) by mouth daily. 05/20/23   Glena Harlene HERO, FNP  Biotin 1000 MCG tablet Take 1,000 mcg by mouth daily with breakfast.    [provider]  Blood Glucose Monitoring Suppl (ACCU-CHEK GUIDE) w/Device KIT as directed to check blood sugar once a day 05/27/23     cholecalciferol  (VITAMIN D3) 25 MCG (1000 UT) tablet Take 1,000 Units by mouth daily with breakfast.    [provider]  digoxin  (LANOXIN ) 0.125 MG tablet Take 1/2 tablet (0.0625mg  total) by mouth daily. 03/15/23   Milford, Harlene HERO, FNP  empagliflozin  (JARDIANCE ) 10 MG TABS tablet Take 1 tablet (10 mg total) by mouth daily. 11/16/22     EPINEPHrine  0.3 mg/0.3 mL IJ SOAJ injection Inject 0.3 mg into the muscle as needed for anaphylaxis. 07/31/23   [name] Orlan HERO, DO  famotidine  (PEPCID ) 20 MG tablet Take 1 tablet (20 mg total) by mouth once a day at bedtime as needed. 07/05/23     ferrous  sulfate 325 (65 FE) MG tablet Take 325 mg by mouth daily with breakfast.    [provider]  furosemide  (LASIX ) 40 MG tablet Take 1 tablet (40 mg total) by mouth daily. 08/26/23   Milford, Harlene HERO, FNP  gabapentin  (NEURONTIN ) 100 MG capsule Take 1 capsule (100 mg total) by mouth 2 (two) times daily. 03/25/23   Burton, Lacie K, NP  glucose blood (ACCU-CHEK GUIDE TEST) test strip Use to check blood sugars once daily. 05/27/23   [name], MD  HYDROcodone -acetaminophen  (NORCO/VICODIN) 5-325 MG tablet Take 1 tablet by mouth every 4 (four) hours as needed. 08/19/23   Elnor Savant A, DO  ipratropium (ATROVENT ) 0.03 % nasal spray Place 1-2 sprays into both nostrils 2 (two) times daily as needed (nasal drainage). 07/31/23   [name] Orlan HERO, DO  ketoconazole  (NIZORAL ) 2 % cream Apply 1 Application topically 2 (two) times daily. 09/26/22     lidocaine -prilocaine  (EMLA ) cream Apply 1 Application topically as needed. 04/03/23   Lanny Callander, MD  loratadine  (CLARITIN ) 10 MG tablet Take 10 mg by mouth daily.    [provider]  metFORMIN  (GLUCOPHAGE -XR) 500 MG 24 hr tablet Take 1 tablet (500 mg total) by mouth with evening meal 09/21/22     metoprolol  succinate (TOPROL -XL) 25 MG 24 hr tablet Take 1 tablet (25 mg total) by mouth daily.  03/15/23   Glena Harlene HERO, FNP  multivitamin (ONE-A-DAY MEN'S) TABS tablet Take 1 tablet by mouth daily with breakfast.    [provider]  Naftifine HCl (NAFTIN) 2 % GEL Apply 1 application  topically every Monday, Wednesday, and Friday.    [provider]  pantoprazole  (PROTONIX ) 40 MG tablet Take 1 tablet (40 mg total) by mouth 2 (two) times daily. 01/04/23     potassium chloride  (KLOR-CON ) 10 MEQ tablet Take 2 tablets (20 mEq total) by mouth daily. 08/12/23   Milford, Harlene HERO, FNP  rivaroxaban  (XARELTO ) 20 MG TABS tablet Take 1 tablet (20 mg total) by mouth daily with supper. 03/15/23   Milford, Harlene HERO, FNP  spironolactone  (ALDACTONE ) 25 MG tablet  Take 1 tablet (25 mg total) by mouth daily. 03/15/23   Milford, Harlene HERO, FNP  sucralfate  (CARAFATE ) 1 g tablet Take 1 tablet (1 g total) by mouth with breakfast, with lunch, and with evening meal for 7 days. 08/19/23 08/28/23  Elnor Savant A, DO  Tavaborole 5 % SOLN Apply 1 application  topically daily as needed (Nail on left hand).    [provider]  valsartan  (DIOVAN ) 40 MG tablet Take 1 tablet (40 mg total) by mouth 2 (two) times daily. 08/13/23 08/12/24  Rolan Ezra RAMAN, MD  prochlorperazine  (COMPAZINE ) 10 MG tablet Take 1 tablet (10 mg total) by mouth every 6 (six) hours as needed (Nausea or vomiting). 02/15/21 05/12/21  Lanny Callander, MD    Allergies: Aspirin , Sacubitril -valsartan , Sulfa antibiotics, and Ace inhibitors    Review of Systems  Constitutional:  Negative for chills and fever.  HENT:  Negative for ear pain and sore throat.   Eyes:  Negative for pain and visual disturbance.  Respiratory:  Positive for cough and shortness of breath.   Cardiovascular:  Negative for chest pain and palpitations.  Gastrointestinal:  Positive for abdominal pain. Negative for vomiting.  Genitourinary:  Negative for dysuria and hematuria.  Musculoskeletal:  Negative for arthralgias and back pain.  Skin:  Negative for color change and rash.  Neurological:  Negative for seizures and syncope.  All other systems reviewed and are negative.   Updated Vital Signs BP 110/74 (BP Location: Right Arm)   Pulse 100   Temp 97.8 F (36.6 C) (Oral)   Resp 16   Ht 5' 7 (1.702 m)   Wt 71 kg   SpO2 94%   BMI 24.52 kg/m   Physical Exam Vitals and nursing note reviewed.  Constitutional:      General: He is not in acute distress.    Appearance: He is well-developed. He is not ill-appearing.  HENT:     Head: Normocephalic and atraumatic.  Eyes:     Conjunctiva/sclera: Conjunctivae normal.  Cardiovascular:     Rate and Rhythm: Normal rate and regular rhythm.     Heart sounds: No murmur heard. Pulmonary:      Effort: Pulmonary effort is normal. No respiratory distress.     Breath sounds: Normal breath sounds.  Abdominal:     Palpations: Abdomen is soft.     Tenderness: There is generalized abdominal tenderness.  Musculoskeletal:        General: No swelling.     Cervical back: Neck supple.  Skin:    General: Skin is warm and dry.     Capillary Refill: Capillary refill takes less than 2 seconds.  Neurological:     Mental Status: He is alert.  Psychiatric:  Mood and Affect: Mood normal.     (all labs ordered are listed, but only abnormal results are displayed) Labs Reviewed  BASIC METABOLIC PANEL WITH GFR - Abnormal; Notable for the following components:      Result Value   Sodium 130 (*)    CO2 18 (*)    BUN 26 (*)    All other components within normal limits  CBC WITH DIFFERENTIAL/PLATELET - Abnormal; Notable for the following components:   RBC 3.25 (*)    Hemoglobin 9.6 (*)    HCT 31.2 (*)    Lymphs Abs 0.6 (*)    All other components within normal limits  URINALYSIS, ROUTINE W REFLEX MICROSCOPIC - Abnormal; Notable for the following components:   Glucose, UA >=500 (*)    All other components within normal limits  RESP PANEL BY RT-PCR (RSV, FLU A&B, COVID)  [id number]  LIPASE, BLOOD  HEPATIC FUNCTION PANEL  TROPONIN I (HIGH SENSITIVITY)  TROPONIN I (HIGH SENSITIVITY)    EKG: EKG Interpretation Date/Time:  Friday August 30 2023 09:57:13 EDT Ventricular Rate:  72 PR Interval:    QRS Duration:  184 QT Interval:  468 QTC Calculation: 512 R Axis:   130  Text Interpretation: Ventricular-paced rhythm with premature ventricular or aberrantly conducted complexes Abnormal ECG Left bundle branch block No acute changes when compared to prior EKG from 08/13/2023 Confirmed by Gennaro Bouchard ([id number]) on 08/30/2023 10:37:17 AM  Radiology: [facility] Chest 1 View Result Date: 08/30/2023 CLINICAL DATA:  Cough, shortness of breath. EXAM: CHEST  1 VIEW COMPARISON:  October 29, 2022.  FINDINGS: Stable cardiomegaly. Left-sided defibrillator is unchanged in position. Right subclavian Port-A-Cath is unchanged. No acute pulmonary disease. Bony thorax is unremarkable. IMPRESSION: No active disease. Electronically Signed   By: Lynwood Landy Raddle M.D.   On: 08/30/2023 10:32     Procedures   Medications Ordered in the ED  HYDROmorphone  (DILAUDID ) injection 1 mg (has no administration in time range)  pantoprazole  (PROTONIX ) injection 40 mg (has no administration in time range)  sucralfate  (CARAFATE ) tablet 1 g (has no administration in time range)  alum & mag hydroxide-simeth (MAALOX/MYLANTA) 200-200-20 MG/5ML suspension 30 mL (30 mLs Oral Given 08/30/23 0951)  sodium chloride  0.9 % bolus 500 mL (0 mLs Intravenous Stopped 08/30/23 1548)  HYDROcodone  bit-homatropine (HYCODAN) 5-1.5 MG/5ML syrup 5 mL (5 mLs Oral Given 08/30/23 1403)                                    Medical Decision Making Medical Decision Making Nursing notes are reviewed. Differential diagnosis for this patient would include but not limited to: GERD, gastritis, pneumonia, PE, other  Cardiac monitor interpretation: Sinus rhythm, no ectopy  Emergency Department Course:  Vital signs and pulse oximetry are reviewed, evaluated by myself and found to be within normal limits prior to final disposition. Findings of laboratory testing and medical imaging are discussed with patient and family that is available. Patient agrees with the medical care plan as follows:  Patient's lab workup reviewed by me and fairly unremarkable however he is still uncomfortable.  Was tachycardic and hypotensive but this seemed to improve after fluids.  He has a CT PE study as well as abdomen and pelvis study pending at this time.  Patient signed out to oncoming provider at 4 PM pending remainder of workup and ultimate disposition  Problems Addressed: Abdominal pain, unspecified abdominal location: undiagnosed new problem with  uncertain  prognosis Shortness of breath: undiagnosed new problem with uncertain prognosis  Amount and/or Complexity of Data Reviewed External Data Reviewed: notes.    Details: Prior ED records reviewed and patient was here and left without receiving results or being seen Labs: ordered. Decision-making details documented in ED Course.    Details: Ordered and interpreted by me and show no acute abnormality Radiology: ordered and independent interpretation performed. Decision-making details documented in ED Course.    Details: Ordered and interpreted by me independently of radiology Chest x-ray: Shows no acute abnormality in the chest ECG/medicine tests: ordered and independent interpretation performed. Decision-making details documented in ED Course.    Details: Ordered and interpreted by me independently of cardiology and showed no acute abnormality, no evidence of STEMI  Risk OTC drugs. Prescription drug management. Parenteral controlled substances. Drug therapy requiring intensive monitoring for toxicity.    Final diagnoses:  Abdominal pain, unspecified abdominal location  Shortness of breath    ED Discharge Orders     None          Gennaro Duwaine CROME, DO 08/30/23 1551

## 2023-08-30 NOTE — ED Provider Notes (Signed)
  Physical Exam  BP 115/76   Pulse 98   Temp 98 F (36.7 C)   Resp 18   Ht 5' 7 (1.702 m)   Wt 71 kg   SpO2 96%   BMI 24.52 kg/m   Physical Exam  Procedures  Procedures  ED Course / MDM    Medical Decision Making Amount and/or Complexity of Data Reviewed Labs: ordered. Radiology: ordered.  Risk OTC drugs. Prescription drug management.   Jon Williams, assumed care for this patient.  In brief this is a 73 year old male here today for increased feeding progression, epigastric abdominal pain.  Patient with remote history of gastric cancer.  Follows with [facility].  Patient signed out pending CT imaging of the chest abdomen pelvis.  Patient CT chest negative for acute process.  His abdomen also did not show acute process, shows some gastric wall thickening which is stable.  Patient with some epigastric pain, small hemoglobin drop over the last 10 days.  Reached out to patient's GI office, spoke with Dr. Avram.  We discussed the patient's presenting symptoms, as well as results.  Patient has follow-up with them on the 30th of this month.  Recommended continued PPI, Carafate  with GI follow-up      Williams Fairy DASEN, DO 08/30/23 1820

## 2023-08-30 NOTE — ED Notes (Signed)
 Pt back from CT

## 2023-08-30 NOTE — ED Notes (Signed)
 Rechecked pts o2 sats in triage same was 95%RA

## 2023-08-30 NOTE — ED Notes (Signed)
 Received report from previous RN.  Pt resting, family at bedside.  Denies needs. Call bell within reach.

## 2023-08-30 NOTE — ED Triage Notes (Signed)
 C/o abd. Pain and with cough , was here on 07/16 however left without being seen.

## 2023-08-30 NOTE — ED Provider Triage Note (Signed)
 Emergency Medicine Provider Triage Evaluation Note  Jon Williams , a 73 y.o. male  was evaluated in triage.  Pt complains of shortness of breath, increased sputum production, head cold, and abdominal pain.  States he was treated by his primary care doctor a few days ago for the symptoms but has had no improvement.  States he has also seen and treated for a kidney stone but since then his symptoms got worse.  States abdominal pain is somewhat different and he no longer has pain with urination.  Review of Systems  Positive: Shortness of breath, cough Negative: Dysuria, vomiting  Physical Exam  BP 104/62   Pulse (!) 105   Temp 97.8 F (36.6 C) (Oral)   Resp 16   SpO2 95%  Gen:   Awake, no distress   Resp:  Normal effort  MSK:   Moves extremities without difficulty  Abdomen:  Soft, diffusely mildly tender to palpation   Medical Decision Making  Medically screening exam initiated at 9:38 AM.  Appropriate orders placed.  Jon Williams was informed that the remainder of the evaluation will be completed by another provider, this initial triage assessment does not replace that evaluation, and the importance of remaining in the ED until their evaluation is complete.  Duwaine Fusi, DO    Fallynn Gravett L, DO 08/30/23 [phone]

## 2023-08-30 NOTE — Discharge Instructions (Addendum)
 While you were in the emergency room, you had blood work done that showed a small decrease in your hemoglobin.  Otherwise, your CT scans that were done were normal.  We discussed your care with one of your GI doctors colleagues who recommended continuing you on your Protonix , with Carafate  3 times per day.  I have sent your prescription for famotidine , take 20 mg each day.  I have sent you a prescription for Zyrtec, take 10 mg each day.  You may also take Tylenol  for pain.  Please follow-up with your GI doctor on your appointment on the 30th, continue taking all other medications as prescribed

## 2023-08-30 NOTE — ED Notes (Signed)
 Pt taken to CT.

## 2023-08-30 NOTE — ED Notes (Signed)
Ultra-sound IV to be placed

## 2023-09-01 ENCOUNTER — Encounter (HOSPITAL_COMMUNITY): Payer: Self-pay | Admitting: Emergency Medicine

## 2023-09-01 ENCOUNTER — Ambulatory Visit (HOSPITAL_COMMUNITY): Admission: EM | Admit: 2023-09-01 | Discharge: 2023-09-01 | Disposition: A | Discharge status: Home / Self Care

## 2023-09-01 ENCOUNTER — Ambulatory Visit (INDEPENDENT_AMBULATORY_CARE_PROVIDER_SITE_OTHER)

## 2023-09-01 DIAGNOSIS — K5901 Slow transit constipation: Secondary | ICD-10-CM

## 2023-09-01 DIAGNOSIS — K5900 Constipation, unspecified: Secondary | ICD-10-CM | POA: Diagnosis not present

## 2023-09-01 MED ORDER — POLYETHYLENE GLYCOL 3350 17 GM/SCOOP PO POWD: 17.0000 g | Freq: Once | ORAL | 0 refills | Status: AC

## 2023-09-01 NOTE — ED Triage Notes (Signed)
 Pt believes constipated, no BM in 3 days. Tried taking Metamucil.

## 2023-09-01 NOTE — ED Provider Notes (Signed)
 [facility]  Note:  This document was prepared using Dragon voice recognition software and may include unintentional dictation errors.  MRN: [id number] DOB: 09/18/1950  Subjective:   Jon Williams is a 73 y.o. male presenting for abdominal bloating, discomfort, constipation x 2 to 3 days.  Patient reports abdominal cramping this morning which prompted him to come into urgent care for evaluation.  Patient was recently prescribed Vicodin after a hospital visit which may have contributed to constipation symptoms.  Patient has been using Metamucil for his constipation but has not had any relief.  Patient has not used any other over-the-counter medications for constipation.  Patient denies any other secondary symptoms, no nausea or vomiting, no severe abdominal pain just occasional abdominal cramping.  No current facility-administered medications for this encounter.  Current Outpatient Medications:    Accu-Chek Softclix Lancets lancets, Use to check your blood sugar finger stick once a day Dx Code E11.9 90 days, Disp: 100 each, Rfl: 3   atorvastatin  (LIPITOR) 40 MG tablet, Take 1 tablet (40 mg total) by mouth daily., Disp: 90 tablet, Rfl: 3   Biotin 1000 MCG tablet, Take 1,000 mcg by mouth daily with breakfast., Disp: , Rfl:    Blood Glucose Monitoring Suppl (ACCU-CHEK GUIDE) w/Device KIT, as directed to check blood sugar once a day, Disp: 1 kit, Rfl: 0   cetirizine  (ZYRTEC ) 10 MG tablet, Take 1 tablet (10 mg total) by mouth daily., Disp: 30 tablet, Rfl: 0   cholecalciferol  (VITAMIN D3) 25 MCG (1000 UT) tablet, Take 1,000 Units by mouth daily with breakfast., Disp: , Rfl:    digoxin  (LANOXIN ) 0.125 MG tablet, Take 1/2 tablet (0.0625mg  total) by mouth daily., Disp: 15 tablet, Rfl: 6   empagliflozin  (JARDIANCE ) 10 MG TABS tablet, Take 1 tablet (10 mg total) by mouth daily., Disp: 90 tablet, Rfl: 3   EPINEPHrine  0.3 mg/0.3 mL IJ SOAJ injection, Inject 0.3 mg into the muscle as needed  for anaphylaxis., Disp: 2 each, Rfl: 1   famotidine  (PEPCID ) 40 MG tablet, Take 1 tablet (40 mg total) by mouth daily., Disp: 30 tablet, Rfl: 0   ferrous sulfate 325 (65 FE) MG tablet, Take 325 mg by mouth daily with breakfast., Disp: , Rfl:    furosemide  (LASIX ) 40 MG tablet, Take 1 tablet (40 mg total) by mouth daily., Disp: 30 tablet, Rfl: 6   gabapentin  (NEURONTIN ) 100 MG capsule, Take 1 capsule (100 mg total) by mouth 2 (two) times daily., Disp: 180 capsule, Rfl: 1   glucose blood (ACCU-CHEK GUIDE TEST) test strip, Use to check blood sugars once daily., Disp: 100 each, Rfl: 3   HYDROcodone -acetaminophen  (NORCO/VICODIN) 5-325 MG tablet, Take 1 tablet by mouth every 4 (four) hours as needed., Disp: 5 tablet, Rfl: 0   ipratropium (ATROVENT ) 0.03 % nasal spray, Place 1-2 sprays into both nostrils 2 (two) times daily as needed (nasal drainage)., Disp: 30 mL, Rfl: 2   ketoconazole  (NIZORAL ) 2 % cream, Apply 1 Application topically 2 (two) times daily., Disp: 30 g, Rfl: 3   lidocaine -prilocaine  (EMLA ) cream, Apply 1 Application topically as needed., Disp: 30 g, Rfl: 0   loratadine  (CLARITIN ) 10 MG tablet, Take 10 mg by mouth daily., Disp: , Rfl:    metFORMIN  (GLUCOPHAGE -XR) 500 MG 24 hr tablet, Take 1 tablet (500 mg total) by mouth with evening meal, Disp: 90 tablet, Rfl: 3   metoprolol  succinate (TOPROL -XL) 25 MG 24 hr tablet, Take 1 tablet (25 mg total) by mouth daily., Disp: 90 tablet, Rfl:  3   multivitamin (ONE-A-DAY MEN'S) TABS tablet, Take 1 tablet by mouth daily with breakfast., Disp: , Rfl:    Naftifine HCl (NAFTIN) 2 % GEL, Apply 1 application  topically every Monday, Wednesday, and Friday., Disp: , Rfl:    pantoprazole  (PROTONIX ) 40 MG tablet, Take 1 tablet (40 mg total) by mouth 2 (two) times daily., Disp: 180 tablet, Rfl: 3   polyethylene glycol powder (GLYCOLAX /MIRALAX ) 17 GM/SCOOP powder, Take 17 g by mouth once for 1 dose. Mix with 6 to 8 ounces of water or juice.  Take medication daily  until bowel movements become more regular., Disp: 255 g, Rfl: 0   potassium chloride  (KLOR-CON ) 10 MEQ tablet, Take 2 tablets (20 mEq total) by mouth daily., Disp: 60 tablet, Rfl: 3   rivaroxaban  (XARELTO ) 20 MG TABS tablet, Take 1 tablet (20 mg total) by mouth daily with supper., Disp: 90 tablet, Rfl: 3   spironolactone  (ALDACTONE ) 25 MG tablet, Take 1 tablet (25 mg total) by mouth daily., Disp: 90 tablet, Rfl: 3   sucralfate  (CARAFATE ) 1 g tablet, Take 1 tablet (1 g total) by mouth with breakfast, with lunch, and with evening meal for 7 days., Disp: 21 tablet, Rfl: 0   Tavaborole 5 % SOLN, Apply 1 application  topically daily as needed (Nail on left hand)., Disp: , Rfl:    valsartan  (DIOVAN ) 40 MG tablet, Take 1 tablet (40 mg total) by mouth 2 (two) times daily., Disp: 60 tablet, Rfl: 11   Allergies  Allergen Reactions   Aspirin  Anaphylaxis and Hives   Sacubitril -Valsartan  Swelling    lip swelling, angioedema   Sulfa Antibiotics Anaphylaxis, Hives, Swelling and Other (See Comments)    Swollen lips   Ace Inhibitors     Possible angioedema    Past Medical History:  Diagnosis Date   AICD (automatic cardioverter/defibrillator) present 05/25/2016   biv icd   Angio-edema    Bunion, right    Chronic combined systolic and diastolic CHF (congestive heart failure) (HCC)    Echo 1/18: Mild conc LVH, EF 15-20, severe diff HK, inf and inf-septal AK, Gr 3 DD, mild to mod MR, severe LAE, mod reduced RVSF, mod RAE, mild TR, PASP 50   Coronary artery disease involving native coronary artery without angina pectoris 04/17/2016   LHC 1/18: pLCx 30, mLCx 20, mRCA 40, dRCA 20, LVEDP 23, mean RA 8, PA 42/20, PCWP 17   Diabetes mellitus without complication (HCC)    Gastric cancer (HCC)    History of atrial fibrillation    History of atrial flutter    History of cardiomegaly 06/07/2016   Noted on CXR   History of colon polyps 06/28/2017   Noted on colonoscopy   Hypertension    LBBB (left bundle  branch block)    Nausea vomiting and diarrhea 04/04/2021   NICM (nonischemic cardiomyopathy) (HCC)    Echo 1/18:  Mild conc LVH, EF 15-20, severe diff HK, inf and inf-septal AK, Gr 3 DD, mild to mod MR, severe LAE, mod reduced RVSF, mod RAE, mild TR, PASP 50   Other secondary pulmonary hypertension (HCC) 04/17/2016   Prostate cancer (HCC) 2019   Sigmoid diverticulosis 06/28/2017   Noted on colonoscopy     Past Surgical History:  Procedure Laterality Date   BIOPSY  12/01/2020   Procedure: BIOPSY;  Surgeon: Rollin Dover, MD;  Location: [facility] ENDOSCOPY;  Service: Endoscopy;;   BIOPSY  06/09/2021   Procedure: BIOPSY;  Surgeon: Rollin Dover, MD;  Location: [facility] ENDOSCOPY;  Service: Gastroenterology;;   BIOPSY  06/29/2022   Procedure: BIOPSY;  Surgeon: Rollin Dover, MD;  Location: [facility] ENDOSCOPY;  Service: Gastroenterology;;   BIV ICD INSERTION CRT-D N/A 05/25/2016   Procedure: BiV ICD Insertion CRT-D;  Surgeon: Danelle LELON Birmingham, MD;  Location: [facility] INVASIVE CV LAB;  Service: Cardiovascular;  Laterality: N/A;   CARDIAC CATHETERIZATION N/A 03/02/2016   Procedure: Right/Left Heart Cath and Coronary Angiography;  Surgeon: Lonni [facility], MD;  Location: [facility] INVASIVE CV LAB;  Service: Cardiovascular;  Laterality: N/A;   CARDIOVERSION N/A 07/17/2016   Procedure: Cardioversion;  Surgeon: Birmingham Danelle LELON, MD;  Location: [facility] INVASIVE CV LAB;  Service: Cardiovascular;  Laterality: N/A;   COLONOSCOPY WITH PROPOFOL  N/A 06/28/2017   Procedure: COLONOSCOPY WITH PROPOFOL ;  Surgeon: Rollin Dover, MD;  Location: [facility] ENDOSCOPY;  Service: Endoscopy;  Laterality: N/A;   colonscopy  2009   ESOPHAGOGASTRODUODENOSCOPY N/A 12/01/2020   Procedure: ESOPHAGOGASTRODUODENOSCOPY (EGD);  Surgeon: Rollin Dover, MD;  Location: [facility] ENDOSCOPY;  Service: Endoscopy;  Laterality: N/A;  IDA/guaiac positive stools   ESOPHAGOGASTRODUODENOSCOPY (EGD) WITH PROPOFOL  N/A 12/16/2020   Procedure: ESOPHAGOGASTRODUODENOSCOPY (EGD) WITH PROPOFOL ;  Surgeon:  Rollin Dover, MD;  Location: [facility] ENDOSCOPY;  Service: Endoscopy;  Laterality: N/A;   ESOPHAGOGASTRODUODENOSCOPY (EGD) WITH PROPOFOL  N/A 06/09/2021   Procedure: ESOPHAGOGASTRODUODENOSCOPY (EGD) WITH PROPOFOL ;  Surgeon: Rollin Dover, MD;  Location: [facility] ENDOSCOPY;  Service: Gastroenterology;  Laterality: N/A;   ESOPHAGOGASTRODUODENOSCOPY (EGD) WITH PROPOFOL  N/A 06/29/2022   Procedure: ESOPHAGOGASTRODUODENOSCOPY (EGD) WITH PROPOFOL ;  Surgeon: Rollin Dover, MD;  Location: [facility] ENDOSCOPY;  Service: Gastroenterology;  Laterality: N/A;   GOLD SEED IMPLANT N/A 12/24/2017   Procedure: GOLD SEED IMPLANT, TRANSERINEAL;  Surgeon: Nieves Cough, MD;  Location: [facility] ORS;  Service: Urology;  Laterality: N/A;   INSERT / REPLACE / REMOVE PACEMAKER     LEAD REVISION  10/10/2018   LEAD REVISION/REPAIR N/A 10/10/2018   Procedure: LEAD REVISION/REPAIR;  Surgeon: [place] Danelle LELON, MD;  Location: [facility] INVASIVE CV LAB;  Service: Cardiovascular;  Laterality: N/A;   POLYPECTOMY  06/28/2017   Procedure: POLYPECTOMY;  Surgeon: Rollin Dover, MD;  Location: [facility] ENDOSCOPY;  Service: Endoscopy;;  ascending and descending colon polyp   PORTACATH PLACEMENT Right 12/23/2020   Procedure: INSERTION PORT-A-CATH;  Surgeon: Dasie Leonor CROME, MD;  Location: [facility] OR;  Service: General;  Laterality: Right;   PROSTATE BIOPSY  02/20/2017   RIGHT HEART CATH N/A 07/25/2023   Procedure: RIGHT HEART CATH;  Surgeon: Rolan Ezra RAMAN, MD;  Location: [facility] INVASIVE CV LAB;  Service: Cardiovascular;  Laterality: N/A;   RIGHT/LEFT HEART CATH AND CORONARY ANGIOGRAPHY N/A 10/30/2022   Procedure: RIGHT/LEFT HEART CATH AND CORONARY ANGIOGRAPHY;  Surgeon: Rolan Ezra RAMAN, MD;  Location: [facility] INVASIVE CV LAB;  Service: Cardiovascular;  Laterality: N/A;   SPACE OAR INSTILLATION N/A 12/24/2017   Procedure: SPACE OAR INSTILLATION;  Surgeon: Nieves Cough, MD;  Location: [facility] ORS;  Service: Urology;  Laterality: N/A;   TOTAL KNEE ARTHROPLASTY Right 08/16/2017   Procedure:  RIGHT TOTAL KNEE ARTHROPLASTY;  Surgeon: Shari Sieving, MD;  Location: [facility] OR;  Service: Orthopedics;  Laterality: Right;   UPPER ESOPHAGEAL ENDOSCOPIC ULTRASOUND (EUS) N/A 12/16/2020   Procedure: UPPER ESOPHAGEAL ENDOSCOPIC ULTRASOUND (EUS);  Surgeon: Rollin Dover, MD;  Location: [facility]COPY;  Service: Endoscopy;  Laterality: N/A;    Family History  Problem Relation Age of Onset   Hypertension Mother    Heart disease Mother    Diabetes Mother    Diabetes Father    Hypertension Father    Cancer  Father        lung cancer   Healthy Sister    Heart attack Brother    Heart disease Brother 74       + tobacco   Healthy Brother     Social History   Tobacco Use   Smoking status: Never    Passive exposure: Never   Smokeless tobacco: Never  Vaping Use   Vaping status: Never Used  Substance Use Topics   Alcohol use: No   Drug use: No    ROS Refer to HPI for ROS details.  Objective:   Vitals: BP 97/61 (BP Location: Left Arm)   Pulse 64   Temp (!) 97.5 F (36.4 C) (Oral)   Resp 18   SpO2 98%   Physical Exam Vitals and nursing note reviewed.  Constitutional:      General: He is not in acute distress.    Appearance: Normal appearance. He is well-developed. He is not ill-appearing or toxic-appearing.  HENT:     Head: Normocephalic.  Cardiovascular:     Rate and Rhythm: Normal rate.  Pulmonary:     Effort: Pulmonary effort is normal. No respiratory distress.  Abdominal:     General: There is distension.     Palpations: Abdomen is soft.     Tenderness: There is abdominal tenderness. There is no right CVA tenderness, left CVA tenderness, guarding or rebound.  Skin:    General: Skin is warm and dry.  Neurological:     General: No focal deficit present.     Mental Status: He is alert and oriented to person, place, and time.  Psychiatric:        Mood and Affect: Mood normal.        Behavior: Behavior normal.     Procedures  No results found for this or any  previous visit (from the past 24 hours).  DG Abd 1 View Result Date: 09/01/2023 CLINICAL DATA:  Constipation. EXAM: ABDOMEN - 1 VIEW COMPARISON:  CT 2 days ago. FINDINGS: Moderate colonic stool burden, similar to recent CT. Small to moderate stool distends the rectum, 5.6 cm. No bowel dilatation or evidence of obstruction. No visible radiopaque calculi. Possible urolith clips in the pelvis. IMPRESSION: Moderate colonic stool burden, similar to recent CT. Small to moderate stool distends the rectum. No bowel obstruction. Electronically Signed   By: Andrea Gasman M.D.   On: 09/01/2023 15:17     Assessment and Plan :     Discharge Instructions       1. Constipation by delayed colonic transit (Primary) - DG Abd 1 View x-ray shows moderate to large stool burden throughout colon, no abnormal bowel gas pattern, no sign of obstruction, note there is considerable amount of stool causing distention of the rectum which make make bowel movements painful. - polyethylene glycol powder (GLYCOLAX /MIRALAX ) 17 GM/SCOOP powder; Take 17 g by mouth once for 1 dose. Mix with 6 to 8 ounces of water or juice.  Take medication daily until bowel movements become more regular.  Dispense: 255 g; Refill: 0 - Use over-the-counter glycerin suppositories to help make stool movement from the rectum a little easier.  You may also get fleets enema at the pharmacy as well to help flush out stool from the rectum. -Continue to monitor symptoms for any change in severity if there is any escalation of current symptoms or development of new symptoms follow-up in ER for further evaluation and management.      Wynelle Dreier B Saavi Mceachron  Aurea Goodell B, NP 09/01/23 1530

## 2023-09-01 NOTE — Discharge Instructions (Addendum)
  1. Constipation by delayed colonic transit (Primary) - DG Abd 1 View x-ray shows moderate to large stool burden throughout colon, no abnormal bowel gas pattern, no sign of obstruction, note there is considerable amount of stool causing distention of the rectum which make make bowel movements painful. - polyethylene glycol powder (GLYCOLAX /MIRALAX ) 17 GM/SCOOP powder; Take 17 g by mouth once for 1 dose. Mix with 6 to 8 ounces of water or juice.  Take medication daily until bowel movements become more regular.  Dispense: 255 g; Refill: 0 - Use over-the-counter glycerin suppositories to help make stool movement from the rectum a little easier.  You may also get fleets enema at the pharmacy as well to help flush out stool from the rectum. -Continue to monitor symptoms for any change in severity if there is any escalation of current symptoms or development of new symptoms follow-up in ER for further evaluation and management.

## 2023-09-02 ENCOUNTER — Other Ambulatory Visit (HOSPITAL_COMMUNITY): Payer: Self-pay

## 2023-09-02 ENCOUNTER — Telehealth: Payer: Self-pay

## 2023-09-02 DIAGNOSIS — E7800 Pure hypercholesterolemia, unspecified: Secondary | ICD-10-CM | POA: Diagnosis not present

## 2023-09-02 DIAGNOSIS — D6869 Other thrombophilia: Secondary | ICD-10-CM | POA: Diagnosis not present

## 2023-09-02 DIAGNOSIS — C61 Malignant neoplasm of prostate: Secondary | ICD-10-CM | POA: Diagnosis not present

## 2023-09-02 DIAGNOSIS — E1169 Type 2 diabetes mellitus with other specified complication: Secondary | ICD-10-CM | POA: Diagnosis not present

## 2023-09-02 DIAGNOSIS — I2510 Atherosclerotic heart disease of native coronary artery without angina pectoris: Secondary | ICD-10-CM | POA: Diagnosis not present

## 2023-09-02 DIAGNOSIS — Z95810 Presence of automatic (implantable) cardiac defibrillator: Secondary | ICD-10-CM | POA: Diagnosis not present

## 2023-09-02 DIAGNOSIS — I428 Other cardiomyopathies: Secondary | ICD-10-CM | POA: Diagnosis not present

## 2023-09-02 DIAGNOSIS — C169 Malignant neoplasm of stomach, unspecified: Secondary | ICD-10-CM | POA: Diagnosis not present

## 2023-09-02 DIAGNOSIS — K5900 Constipation, unspecified: Secondary | ICD-10-CM | POA: Diagnosis not present

## 2023-09-02 NOTE — Progress Notes (Signed)
 Remote ICD transmission.

## 2023-09-02 NOTE — Addendum Note (Signed)
 Addended by: VICCI SELLER A on: 09/02/2023 09:45 AM   Modules accepted: Orders

## 2023-09-02 NOTE — Telephone Encounter (Signed)
 I called pt to offer him a date for his lead extraction with Dr. Waddell. He put his Wife on the phone and she informed me that the pt had been in the ED 3 times in the past week with abdominal pain.   After speaking with Dr. Waddell, the pt was advised to see his GI Dr first then call us  back once they figure out why he is having abdominal pain.   We will schedule lead extraction after abdominal pain in resolved.

## 2023-09-03 ENCOUNTER — Other Ambulatory Visit (HOSPITAL_COMMUNITY): Payer: Self-pay

## 2023-09-06 ENCOUNTER — Other Ambulatory Visit (HOSPITAL_COMMUNITY): Payer: Self-pay

## 2023-09-06 DIAGNOSIS — I739 Peripheral vascular disease, unspecified: Secondary | ICD-10-CM | POA: Diagnosis not present

## 2023-09-06 DIAGNOSIS — I4891 Unspecified atrial fibrillation: Secondary | ICD-10-CM | POA: Diagnosis not present

## 2023-09-06 DIAGNOSIS — I5022 Chronic systolic (congestive) heart failure: Secondary | ICD-10-CM | POA: Diagnosis not present

## 2023-09-06 DIAGNOSIS — E1169 Type 2 diabetes mellitus with other specified complication: Secondary | ICD-10-CM | POA: Diagnosis not present

## 2023-09-10 NOTE — Progress Notes (Incomplete)
 ADVANCED HF CLINIC NOTE   Primary Care: [name], MD Primary Cardiologist: Aleene Passe, MD (Inactive) HF Cardiologist: Dr. Rolan  HPI: Jon Williams is a 73 y.o. with a history of chronic systolic heart failure, St Jude ICD, permanent atrial fibrillation, CAD, DMII, LBBB, HTN, gastric cancer 2022, and prostate cancer 2019.    Cath 2018 showed mild to moderate non-obstructive CAD. Fick CI 1.9.    Echo 10/22 showed EF 20-25%, mod-severe Jon, mod TR, RV nl   Echo 2/23 showed EF 20-25%, mild Jon, RV not well visualized   Admitted 8/23 with HF. Diuresed with IV lasix  and GDMT adjusted. Echo showed EF 20-25%, RV mildly reduced, LA severely dilated, moderate-severe Jon.    Had follow up with Dr Lanny 09/2022. CT scan no evidence of recurrent cancer. Plan for repeat CT in 8 months.    Admitted 9/24 with CHF. Echo showed EF 10-15%, G3DD, RV mildly reduced. R/LHC showed nonobstructive CAD, normal filling pressures, and low cardiac index. Unable to get cMRI as device not compatible. GDMT titrated and he was discharged home, weight 183 lbs.  Echo 12/24 EF 20% with severe LV dilation, normal RV, severe LAE, moderate Jon, IVC normal.   RHC 6/25 showing normal filling pressures but low cardiac output.   Seen in the ER in 6/25 with angioedema, followed up with allergist and was taken off Entresto .   Zio monitor to quantify PVCs showed monitor 1: 152 VT runs, 100% AF burden, 27.6% PVC burden; monitor 2 showed 49 VT runs, 100% AF burden, 23.2 % PVC burden.  Follow up with EP 7/25, planning for BiV upgrade.  Seen in ED 08/30/23 with abd pain. CT chest and abd re-assuring. He was discharged with GI follow up. Saw Dr. Rollin and treating for reflux.  Today he returns for HF follow up with wife. Main complaints today are GI-related. Continues with stomach pains/cramping, GI is treated him for reflux. He has SOB walking, does OK with ADLs. Has fatigue when he pushes himself. Abdomen feels tight. Has  occasional dizziness, no falls or syncope. Denies palpitations, abnormal bleeding, CP, or PND/Orthopnea. Appetite fair. Weight at home 157 pounds. Taking all medications.   ReDs reading: 28 %, normal  ECG (personally reviewed):  none ordered today.  St Jude device interrogation (personally reviewed): 75% RV pacing, >99% AF, thoracic impedance down a bit.   Labs (9/24): K 3.7, creatinine 1.13, Lp(a) 184 Labs (10/24): K 4.3, creatinine 1.27, hgb 13.6 Labs (1/25): 4.1, creatinine 1.26 Labs (2/25): K 4, creatinine 1.21, hgb 11.2, LFTs normal Labs (6/25): K 4.4, creatinine 1.4 Labs (7/25): K 4.0, creatinine 1.09  PMH: 1. H/o gastric cancer.  2. H/o prostate cancer.  3. Type 2 DM 4. Atrial fibrillation: Permanent 5. Chronic systolic CHF: Nonischemic cardiomyopathy. St Jude ICD, unable to find adequate coronary vein for LV lead.   - R/LHC (2018): Mild to moderate non obstructive CAD; RA 8, PA 42/20 (29), PCWP 17, CO/CI (Fick) 3.76/ 1.9  - Echo (10/22): EF 20-25%, mod-severe Jon, mod TR, RV normal - Echo (2/23): EF 20-25%, mild Jon, RV not well visualized - Echo (8/24): EF 20-25%, RV mildly reduced, LA severely dilated, moderate to severe Jon - R/LHC (9/24): nonobstructive CAD; RA 1, PA 28/10 (17), PCWP 8, Fick CI 2.16, thermo CI 1.84.  - Echo (9/24): EF 10-15%, severe LV dilation, mild RV dysfunction with moderate RV enlargement, moderate-severe Jon.  - Echo (12/24): EF 20% with severe LV dilation, normal RV, severe LAE, moderate Jon,  IVC normal.  - RHC (6/25): mean RA 4, PA 29/12 mean 19, mean PCWP 11, CI 2.2 Fick/1.87 thermo, PAPi 4.25 - Suspect Entresto -related angioedema.  6. Fe deficiency anemia 7. Spinal stenosis  FH: mother CVA, MI  Review of Systems: All systems reviewed and negative except as per HPI.   Current Outpatient Medications  Medication Sig Dispense Refill   Accu-Chek Softclix Lancets lancets Use to check your blood sugar finger stick once a day Dx Code E11.9 90 days 100  each 3   atorvastatin  (LIPITOR) 40 MG tablet Take 1 tablet (40 mg total) by mouth daily. 90 tablet 3   Biotin 1000 MCG tablet Take 1,000 mcg by mouth daily with breakfast.     Blood Glucose Monitoring Suppl (ACCU-CHEK GUIDE) w/Device KIT as directed to check blood sugar once a day 1 kit 0   cetirizine  (ZYRTEC ) 10 MG tablet Take 1 tablet (10 mg total) by mouth daily. 30 tablet 0   cholecalciferol  (VITAMIN D3) 25 MCG (1000 UT) tablet Take 1,000 Units by mouth daily with breakfast.     digoxin  (LANOXIN ) 0.125 MG tablet Take 1/2 tablet (0.0625mg  total) by mouth daily. 15 tablet 6   empagliflozin  (JARDIANCE ) 10 MG TABS tablet Take 1 tablet (10 mg total) by mouth daily. 90 tablet 3   EPINEPHrine  0.3 mg/0.3 mL IJ SOAJ injection Inject 0.3 mg into the muscle as needed for anaphylaxis. 2 each 1   famotidine  (PEPCID ) 40 MG tablet Take 1 tablet (40 mg total) by mouth daily. 30 tablet 0   ferrous sulfate 325 (65 FE) MG tablet Take 325 mg by mouth daily with breakfast.     furosemide  (LASIX ) 40 MG tablet Take 1 tablet (40 mg total) by mouth daily. 30 tablet 6   gabapentin  (NEURONTIN ) 100 MG capsule Take 1 capsule (100 mg total) by mouth 2 (two) times daily. 180 capsule 1   glucose blood (ACCU-CHEK GUIDE TEST) test strip Use to check blood sugars once daily. 100 each 3   HYDROcodone -acetaminophen  (NORCO/VICODIN) 5-325 MG tablet Take 1 tablet by mouth every 4 (four) hours as needed. 5 tablet 0   ipratropium (ATROVENT ) 0.03 % nasal spray Place 1-2 sprays into both nostrils 2 (two) times daily as needed (nasal drainage). 30 mL 2   ketoconazole  (NIZORAL ) 2 % cream Apply 1 Application topically 2 (two) times daily. 30 g 3   lidocaine -prilocaine  (EMLA ) cream Apply 1 Application topically as needed. 30 g 0   metFORMIN  (GLUCOPHAGE -XR) 500 MG 24 hr tablet Take 1 tablet (500 mg total) by mouth with evening meal 90 tablet 3   metoprolol  succinate (TOPROL -XL) 25 MG 24 hr tablet Take 1 tablet (25 mg total) by mouth daily.  90 tablet 3   multivitamin (ONE-A-DAY MEN'S) TABS tablet Take 1 tablet by mouth daily with breakfast.     Naftifine HCl (NAFTIN) 2 % GEL Apply 1 application  topically every Monday, Wednesday, and Friday.     pantoprazole  (PROTONIX ) 40 MG tablet Take 1 tablet (40 mg total) by mouth 2 (two) times daily. 180 tablet 3   potassium chloride  (KLOR-CON ) 10 MEQ tablet Take 2 tablets (20 mEq total) by mouth daily. 60 tablet 3   rivaroxaban  (XARELTO ) 20 MG TABS tablet Take 1 tablet (20 mg total) by mouth daily with supper. 90 tablet 3   spironolactone  (ALDACTONE ) 25 MG tablet Take 1 tablet (25 mg total) by mouth daily. 90 tablet 3   sucralfate  (CARAFATE ) 1 g tablet Take 1 tablet (1 g total) by  mouth with breakfast, with lunch, and with evening meal for 7 days. 21 tablet 0   Tavaborole 5 % SOLN Apply 1 application  topically daily as needed (Nail on left hand).     valsartan  (DIOVAN ) 40 MG tablet Take 1 tablet (40 mg total) by mouth 2 (two) times daily. 60 tablet 11   No current facility-administered medications for this encounter.   Allergies  Allergen Reactions   Aspirin  Anaphylaxis and Hives   Sacubitril -Valsartan  Swelling    lip swelling, angioedema   Sulfa Antibiotics Anaphylaxis, Hives, Swelling and Other (See Comments)    Swollen lips   Ace Inhibitors     Possible angioedema   Social History   Socioeconomic History   Marital status: Married    Spouse name: Not on file   Number of children: 2   Years of education: Not on file   Highest education level: Not on file  Occupational History   Occupation: retired  Tobacco Use   Smoking status: Never    Passive exposure: Never   Smokeless tobacco: Never  Vaping Use   Vaping status: Never Used  Substance and Sexual Activity   Alcohol use: No   Drug use: No   Sexual activity: Not Currently  Other Topics Concern   Not on file  Social History Narrative   Retired Location manager. Married to Jon Williams. Daughter, Jon Williams,  lives in [place] . Son, Jon Williams, lives in [place] .   Social Drivers of Corporate investment banker Strain: Low Risk  (12/26/2021)   Overall Financial Resource Strain (CARDIA)    Difficulty of Paying Living Expenses: Not very hard  Food Insecurity: No Food Insecurity (10/29/2022)   Hunger Vital Sign    Worried About Running Out of Food in the Last Year: Never true    Ran Out of Food in the Last Year: Never true  Transportation Needs: No Transportation Needs (10/29/2022)   PRAPARE - Administrator, Civil Service (Medical): No    Lack of Transportation (Non-Medical): No  Physical Activity: Insufficiently Active (09/26/2021)   Exercise Vital Sign    Days of Exercise per Week: 7 days    Minutes of Exercise per Session: 10 min  Stress: No Stress Concern Present (09/26/2021)   [employer] of Occupational Health - Occupational Stress Questionnaire    Feeling of Stress : Not at all  Social Connections: Not on file  Intimate Partner Violence: Not At Risk (10/29/2022)   Humiliation, Afraid, Rape, and Kick questionnaire    Fear of Current or Ex-Partner: No    Emotionally Abused: No    Physically Abused: No    Sexually Abused: No   BP (!) 104/58   Pulse 95   Ht 5' 6 (1.676 m)   Wt 71.5 kg (157 lb 9.6 oz)   SpO2 91%   BMI 25.44 kg/m   Wt Readings from Last 3 Encounters:  09/13/23 71.5 kg (157 lb 9.6 oz)  08/30/23 71 kg (156 lb 8.4 oz)  08/28/23 71 kg (156 lb 8 oz)   PHYSICAL EXAM: General:  NAD. No resp difficulty, arrived in [facility], frail-appearing. HEENT: Normal Neck: Supple. No JVD. Cor: Regular rate & rhythm. No rubs, gallops or murmurs. Lungs: Clear Abdomen: Soft, nontender, nondistended.  Extremities: No cyanosis, clubbing, rash, edema Neuro: Alert & oriented x 3, moves all 4 extremities w/o difficulty. Affect pleasant.  ASSESSMENT & PLAN: 1. Chronic systolic CHF: Long-standing cardiomyopathy, nonischemic.  St Jude ICD, was unable to find adequate  vein for LV lead.  Device is not MRI compatible. Cath in 2018 with nonobstructive CAD but CI at that time was low at 1.9.  Echo in 8/23 showed EF 20-25%, mild RV dysfunction, moderate-severe Jon.  Admitted 9/24 with CHF. Echo showed EF 10-15%, G3DD, RV mildly reduced. R/LHC showed non-obstructive CAD, normal filling pressures and low CI (2.16 Fick/1.84 thermo). Echo 12/24 showed EF 20% with severe LV dilation, normal RV, severe LAE, moderate Jon, IVC normal. Repeat RHC in 6/25 again showed low CI (2.2 F/1.87 T) with normal filling pressures. He had to stop Entresto  with angioedema.  He is not volume overloaded by exam or Corvue.  ReDs normal at 28%. NYHA class III symptoms, somewhat progressive.  - Based on low output on prior RHCs and markedly low EF, he would be a reasonable LVAD candidate.  We have discussed this extensively.  He is very clear that he does not want a cardiac surgery, and I think that he understands the situation and his future risk well.   - Patient is RV pacing 75% of the time and also has a LBBB around 150 msec at baseline as well as frequent PVCs. Unable to place CS lead in the past. As above, he has seen Dr Waddell and planning BiV upgrade. - Continue digoxin  0.0625 mg daily. Dig level 0.6 08/28/23 - Continue Lasix  40 mg daily.  - Continue valsartan  40 mg bid (off Entresto  with angioedema, not a candidate for ACEi or Entresto ).   - Continue spironolactone  25 mg daily. - Continue Jardiance  10 mg daily - Continue Toprol  XL 25 mg daily. 2. Atrial fibrillation: This is permanent.   - Continue Xarelto .  No bleeding issues. 3. Gastric cancer: Treated, now being monitored. Follows with Dr. Lanny 4. Type 2 diabetes: Per PCP.  - Continue Jardiance . 5. OSA: Suspected. He declines sleep study. 6. PVCs: Zio showed high PVC burden.  - Would not increase Toprol  XL with low output.  - Could use amiodarone, but wonder if left bundle lead and pacing could suppress.  - Will discuss loading with amiodarone with Dr.  Rolan  Follow up in 3 months with Dr. Rolan Raisin [facility] FNP-BC 09/13/23

## 2023-09-11 DIAGNOSIS — R1013 Epigastric pain: Secondary | ICD-10-CM | POA: Diagnosis not present

## 2023-09-11 DIAGNOSIS — K219 Gastro-esophageal reflux disease without esophagitis: Secondary | ICD-10-CM | POA: Diagnosis not present

## 2023-09-11 DIAGNOSIS — C169 Malignant neoplasm of stomach, unspecified: Secondary | ICD-10-CM | POA: Diagnosis not present

## 2023-09-12 ENCOUNTER — Telehealth (HOSPITAL_COMMUNITY): Payer: Self-pay

## 2023-09-12 ENCOUNTER — Other Ambulatory Visit (HOSPITAL_COMMUNITY): Payer: Self-pay

## 2023-09-12 DIAGNOSIS — E0851 Diabetes mellitus due to underlying condition with diabetic peripheral angiopathy without gangrene: Secondary | ICD-10-CM | POA: Diagnosis not present

## 2023-09-12 DIAGNOSIS — I4891 Unspecified atrial fibrillation: Secondary | ICD-10-CM | POA: Diagnosis not present

## 2023-09-12 DIAGNOSIS — I739 Peripheral vascular disease, unspecified: Secondary | ICD-10-CM | POA: Diagnosis not present

## 2023-09-12 DIAGNOSIS — I428 Other cardiomyopathies: Secondary | ICD-10-CM | POA: Diagnosis not present

## 2023-09-12 DIAGNOSIS — E1169 Type 2 diabetes mellitus with other specified complication: Secondary | ICD-10-CM | POA: Diagnosis not present

## 2023-09-12 DIAGNOSIS — C61 Malignant neoplasm of prostate: Secondary | ICD-10-CM | POA: Diagnosis not present

## 2023-09-12 DIAGNOSIS — I5022 Chronic systolic (congestive) heart failure: Secondary | ICD-10-CM | POA: Diagnosis not present

## 2023-09-12 MED ORDER — JARDIANCE 10 MG PO TABS
10.0000 mg | ORAL_TABLET | Freq: Every day | ORAL | 3 refills | Status: AC
  Filled 2023-12-06: qty 90, 90d supply, fill #0
  Filled 2024-03-04: qty 90, 90d supply, fill #1

## 2023-09-12 NOTE — Telephone Encounter (Signed)
 Called to confirm/remind patient of their appointment at the [facility] on 09/13/23.   Appointment:   [x] Confirmed  [] Left mess   [] No answer/No voice mail  [] VM Full/unable to leave message  [] Phone not in service  Patient reminded to bring all medications and/or complete list.  Confirmed patient has transportation. Gave directions, instructed to utilize valet parking.

## 2023-09-13 ENCOUNTER — Encounter (HOSPITAL_COMMUNITY): Payer: Self-pay

## 2023-09-13 ENCOUNTER — Ambulatory Visit (HOSPITAL_COMMUNITY)
Admission: RE | Admit: 2023-09-13 | Discharge: 2023-09-13 | Disposition: A | Discharge status: Home / Self Care | Source: Ambulatory Visit | Attending: Family Medicine | Admitting: Family Medicine

## 2023-09-13 ENCOUNTER — Ambulatory Visit (HOSPITAL_COMMUNITY): Payer: Self-pay | Admitting: Family Medicine

## 2023-09-13 VITALS — BP 104/58 | HR 95 | Ht 66.0 in | Wt 157.6 lb

## 2023-09-13 DIAGNOSIS — Z79899 Other long term (current) drug therapy: Secondary | ICD-10-CM | POA: Diagnosis not present

## 2023-09-13 DIAGNOSIS — I110 Hypertensive heart disease with heart failure: Secondary | ICD-10-CM | POA: Insufficient documentation

## 2023-09-13 DIAGNOSIS — Z7901 Long term (current) use of anticoagulants: Secondary | ICD-10-CM | POA: Diagnosis not present

## 2023-09-13 DIAGNOSIS — I5022 Chronic systolic (congestive) heart failure: Secondary | ICD-10-CM

## 2023-09-13 DIAGNOSIS — Z7984 Long term (current) use of oral hypoglycemic drugs: Secondary | ICD-10-CM | POA: Insufficient documentation

## 2023-09-13 DIAGNOSIS — Z8546 Personal history of malignant neoplasm of prostate: Secondary | ICD-10-CM | POA: Insufficient documentation

## 2023-09-13 DIAGNOSIS — G4733 Obstructive sleep apnea (adult) (pediatric): Secondary | ICD-10-CM | POA: Diagnosis not present

## 2023-09-13 DIAGNOSIS — I2510 Atherosclerotic heart disease of native coronary artery without angina pectoris: Secondary | ICD-10-CM | POA: Diagnosis not present

## 2023-09-13 DIAGNOSIS — I428 Other cardiomyopathies: Secondary | ICD-10-CM | POA: Insufficient documentation

## 2023-09-13 DIAGNOSIS — E119 Type 2 diabetes mellitus without complications: Secondary | ICD-10-CM | POA: Diagnosis not present

## 2023-09-13 DIAGNOSIS — I4821 Permanent atrial fibrillation: Secondary | ICD-10-CM | POA: Diagnosis not present

## 2023-09-13 DIAGNOSIS — I447 Left bundle-branch block, unspecified: Secondary | ICD-10-CM | POA: Insufficient documentation

## 2023-09-13 DIAGNOSIS — Z85028 Personal history of other malignant neoplasm of stomach: Secondary | ICD-10-CM

## 2023-09-13 DIAGNOSIS — I493 Ventricular premature depolarization: Secondary | ICD-10-CM

## 2023-09-13 LAB — BRAIN NATRIURETIC PEPTIDE: B Natriuretic Peptide: 397.6 pg/mL — ABNORMAL HIGH (ref 0.0–100.0)

## 2023-09-13 LAB — CBC
HCT: 31.3 % — ABNORMAL LOW (ref 39.0–52.0)
Hemoglobin: 9.8 g/dL — ABNORMAL LOW (ref 13.0–17.0)
MCH: 30 pg (ref 26.0–34.0)
MCHC: 31.3 g/dL (ref 30.0–36.0)
MCV: 95.7 fL (ref 80.0–100.0)
Platelets: 318 K/uL (ref 150–400)
RBC: 3.27 MIL/uL — ABNORMAL LOW (ref 4.22–5.81)
RDW: 16.3 % — ABNORMAL HIGH (ref 11.5–15.5)
WBC: 7.3 K/uL (ref 4.0–10.5)
nRBC: 0 % (ref 0.0–0.2)

## 2023-09-13 LAB — BASIC METABOLIC PANEL WITH GFR
Anion gap: 9 (ref 5–15)
BUN: 23 mg/dL (ref 8–23)
CO2: 22 mmol/L (ref 22–32)
Calcium: 8.7 mg/dL — ABNORMAL LOW (ref 8.9–10.3)
Chloride: 102 mmol/L (ref 98–111)
Creatinine, Ser: 1.18 mg/dL (ref 0.61–1.24)
GFR, Estimated: >60 mL/min (ref >60)
Glucose, Bld: 138 mg/dL — ABNORMAL HIGH (ref 70–99)
Potassium: 3.8 mmol/L (ref 3.5–5.1)
Sodium: 133 mmol/L — ABNORMAL LOW (ref 135–145)

## 2023-09-13 NOTE — Patient Instructions (Addendum)
 Good to see you today!  Labs done today, your results will be available in MyChart, we will contact you for abnormal readings.  Your physician recommends that you schedule a follow-up appointment 3 months(November) Call office in September to schedule an appointment  If you have any questions or concerns before your next appointment please send us  a message through [place] or call our office at [phone].    TO LEAVE A MESSAGE FOR THE NURSE SELECT OPTION 2, PLEASE LEAVE A MESSAGE INCLUDING: YOUR NAME DATE OF BIRTH CALL BACK NUMBER REASON FOR CALL**this is important as we prioritize the call backs  YOU WILL RECEIVE A CALL BACK THE SAME DAY AS LONG AS YOU CALL BEFORE 4:00 PM At the [facility], you and your health needs are our priority. As part of our continuing mission to provide you with exceptional heart care, we have created designated Provider Care Teams. These Care Teams include your primary Cardiologist (physician) and Advanced Practice Providers (APPs- Physician Assistants and Nurse Practitioners) who all work together to provide you with the care you need, when you need it.   You may see any of the following providers on your designated Care Team at your next follow up: Dr Toribio Fuel Dr Ezra Shuck Dr. Ria Commander Dr. Morene Brownie Amy Lenetta, NP Caffie Shed, [place] [facility] [place], [place] Beckey Coe, NP [place] Lee, NP Ellouise Class, NP Tinnie Redman, PharmD Jaun Bash, PharmD   Please be sure to bring in all your medications bottles to every appointment.    Thank you for choosing [facility]

## 2023-09-13 NOTE — Progress Notes (Signed)
 ReDS Vest / Clip - 09/13/23 1100       ReDS Vest / Clip   Station Marker C    Ruler Value 31    ReDS Value Range Low volume    ReDS Actual Value 28

## 2023-09-13 NOTE — Addendum Note (Signed)
 Encounter addended by: Glena Harlene HERO, FNP on: 09/13/2023 5:01 PM  Actions taken: Clinical Note Signed

## 2023-09-15 DIAGNOSIS — I493 Ventricular premature depolarization: Secondary | ICD-10-CM

## 2023-09-15 NOTE — Telephone Encounter (Signed)
 No change in the treatment.

## 2023-09-16 ENCOUNTER — Other Ambulatory Visit: Payer: Self-pay | Admitting: Gastroenterology

## 2023-09-16 ENCOUNTER — Other Ambulatory Visit (HOSPITAL_COMMUNITY): Payer: Self-pay

## 2023-09-16 ENCOUNTER — Telehealth (HOSPITAL_COMMUNITY): Payer: Self-pay | Admitting: Family Medicine

## 2023-09-16 ENCOUNTER — Telehealth: Payer: Self-pay | Admitting: *Deleted

## 2023-09-16 ENCOUNTER — Other Ambulatory Visit (HOSPITAL_COMMUNITY): Payer: Self-pay | Admitting: Family Medicine

## 2023-09-16 ENCOUNTER — Ambulatory Visit (HOSPITAL_COMMUNITY): Payer: Self-pay | Admitting: Cardiology

## 2023-09-16 MED ORDER — AMIODARONE HCL 200 MG PO TABS
ORAL_TABLET | ORAL | 3 refills | Status: DC
  Filled 2023-09-16: qty 104, 90d supply, fill #0
  Filled 2023-12-06: qty 90, 90d supply, fill #1

## 2023-09-16 NOTE — Telephone Encounter (Signed)
 I discussed patient's Zio results and recommendations discussed with Dr. Rolan to start amiodarone . Side effects discussed and patient agreeable. Rx sent to pharmacy.

## 2023-09-16 NOTE — Telephone Encounter (Signed)
   Pre-operative Risk Assessment    Patient Name: Jon Williams  DOB: November 05, 1950 MRN: [id number]   Date of last office visit: 08/101/2025 Date of next office visit: N/A   Request for Surgical Clearance    Procedure:  EGD   Date of Surgery:  Clearance 10/04/23                                Surgeon:  DR. HUNG Surgeon's Group or Practice Name:  [facility] Phone number:  [phone] Fax number:  [phone]   Type of Clearance Requested:   - Medical  - Pharmacy:  Hold Rivaroxaban  (Xarelto ) and PT ON JARDIANCE  (NOT SURE IF NEEDS TO HOLD)    NOT INDICATED   Type of Anesthesia:  PROPOFOL     Additional requests/questions:    [facility]   09/16/2023, 3:58 PM

## 2023-09-17 NOTE — Telephone Encounter (Signed)
 Patient with diagnosis of atrial fibrillation on Xarelto  for anticoagulation.    Procedure:  EGD    Date of Surgery:  Clearance 10/04/23     CHA2DS2-VASc Score = 5   This indicates a 7.2% annual risk of stroke. The patient's score is based upon: CHF History: 1 HTN History: 1 Diabetes History: 1 Stroke History: 0 Vascular Disease History: 1 Age Score: 1 Gender Score: 0   CrCl 55 Platelet count 318  Patient has not had an Afib/aflutter ablation within the last 3 months or DCCV within the last 30 days  Per office protocol, patient can hold Xarelto  for 2 days prior to procedure.   Patient will not need bridging with Lovenox  (enoxaparin ) around procedure.  **This guidance is not considered finalized until pre-operative APP has relayed final recommendations.**

## 2023-09-18 ENCOUNTER — Other Ambulatory Visit: Payer: Self-pay | Admitting: Nurse Practitioner

## 2023-09-18 ENCOUNTER — Other Ambulatory Visit (HOSPITAL_COMMUNITY): Payer: Self-pay

## 2023-09-18 MED ORDER — GABAPENTIN 100 MG PO CAPS
100.0000 mg | ORAL_CAPSULE | Freq: Two times a day (BID) | ORAL | 1 refills | Status: DC
  Filled 2023-09-23: qty 180, 90d supply, fill #0
  Filled 2023-12-19: qty 180, 90d supply, fill #1

## 2023-09-19 ENCOUNTER — Other Ambulatory Visit (HOSPITAL_COMMUNITY): Payer: Self-pay

## 2023-09-19 DIAGNOSIS — N481 Balanitis: Secondary | ICD-10-CM | POA: Diagnosis not present

## 2023-09-20 NOTE — Addendum Note (Signed)
 Encounter addended by: Buell Powell HERO, RN on: 09/20/2023 2:43 PM  Actions taken: Charge Capture section accepted

## 2023-09-20 NOTE — Telephone Encounter (Addendum)
     Primary Cardiologist: Aleene Passe, MD (Inactive)  Chart reviewed as part of pre-operative protocol coverage. Given past medical history and time since last visit, based on ACC/AHA guidelines, SNYDER COLAVITO would be at acceptable risk for the planned procedure without further cardiovascular testing.   Patient has not had an Afib/aflutter ablation within the last 3 months or DCCV within the last 30 days   Per office protocol, patient can hold Xarelto  for 2 days prior to procedure.   Patient will not need bridging with Lovenox  (enoxaparin ) around procedure.  Patient's Jardiance  may be held for 3 days prior to his procedure.  Please resume postoperatively when safe to do so.   I will route this recommendation to the requesting party via Epic fax function and remove from pre-op  pool.  Please call with questions.  Jon Williams. Anthonella Klausner NP-C     09/20/2023, 9:08 AM [facility] [address] Office [phone] Fax [phone]

## 2023-09-23 ENCOUNTER — Other Ambulatory Visit (HOSPITAL_COMMUNITY): Payer: Self-pay

## 2023-09-23 ENCOUNTER — Encounter: Payer: Self-pay | Admitting: Internal Medicine

## 2023-09-23 MED ORDER — METFORMIN HCL ER 500 MG PO TB24
500.0000 mg | ORAL_TABLET | Freq: Every day | ORAL | 3 refills | Status: AC
  Filled 2023-09-23: qty 90, 90d supply, fill #0
  Filled 2023-12-06 – 2023-12-19 (×2): qty 90, 90d supply, fill #1

## 2023-09-25 ENCOUNTER — Encounter (HOSPITAL_COMMUNITY): Payer: Self-pay | Admitting: Gastroenterology

## 2023-09-27 ENCOUNTER — Telehealth: Payer: Self-pay | Admitting: Internal Medicine

## 2023-09-27 ENCOUNTER — Emergency Department (HOSPITAL_COMMUNITY)

## 2023-09-27 ENCOUNTER — Encounter (HOSPITAL_COMMUNITY): Payer: Self-pay | Admitting: Emergency Medicine

## 2023-09-27 ENCOUNTER — Inpatient Hospital Stay (HOSPITAL_COMMUNITY)
Admission: EM | Admit: 2023-09-27 | Discharge: 2023-09-30 | DRG: 683 | Disposition: A | Discharge status: Home / Self Care | Attending: Internal Medicine | Admitting: Internal Medicine

## 2023-09-27 ENCOUNTER — Other Ambulatory Visit: Payer: Self-pay

## 2023-09-27 DIAGNOSIS — I447 Left bundle-branch block, unspecified: Secondary | ICD-10-CM | POA: Diagnosis present

## 2023-09-27 DIAGNOSIS — I428 Other cardiomyopathies: Secondary | ICD-10-CM | POA: Diagnosis present

## 2023-09-27 DIAGNOSIS — I4819 Other persistent atrial fibrillation: Secondary | ICD-10-CM | POA: Diagnosis present

## 2023-09-27 DIAGNOSIS — Z8249 Family history of ischemic heart disease and other diseases of the circulatory system: Secondary | ICD-10-CM

## 2023-09-27 DIAGNOSIS — I2510 Atherosclerotic heart disease of native coronary artery without angina pectoris: Secondary | ICD-10-CM | POA: Diagnosis present

## 2023-09-27 DIAGNOSIS — Z66 Do not resuscitate: Secondary | ICD-10-CM | POA: Diagnosis present

## 2023-09-27 DIAGNOSIS — I5042 Chronic combined systolic (congestive) and diastolic (congestive) heart failure: Secondary | ICD-10-CM | POA: Diagnosis present

## 2023-09-27 DIAGNOSIS — I959 Hypotension, unspecified: Secondary | ICD-10-CM

## 2023-09-27 DIAGNOSIS — Z85028 Personal history of other malignant neoplasm of stomach: Secondary | ICD-10-CM | POA: Diagnosis not present

## 2023-09-27 DIAGNOSIS — E860 Dehydration: Secondary | ICD-10-CM | POA: Diagnosis present

## 2023-09-27 DIAGNOSIS — K5730 Diverticulosis of large intestine without perforation or abscess without bleeding: Secondary | ICD-10-CM | POA: Diagnosis not present

## 2023-09-27 DIAGNOSIS — E1165 Type 2 diabetes mellitus with hyperglycemia: Secondary | ICD-10-CM | POA: Diagnosis present

## 2023-09-27 DIAGNOSIS — E871 Hypo-osmolality and hyponatremia: Secondary | ICD-10-CM | POA: Diagnosis present

## 2023-09-27 DIAGNOSIS — I4821 Permanent atrial fibrillation: Secondary | ICD-10-CM | POA: Diagnosis present

## 2023-09-27 DIAGNOSIS — D3502 Benign neoplasm of left adrenal gland: Secondary | ICD-10-CM | POA: Diagnosis not present

## 2023-09-27 DIAGNOSIS — N179 Acute kidney failure, unspecified: Secondary | ICD-10-CM | POA: Diagnosis present

## 2023-09-27 DIAGNOSIS — E861 Hypovolemia: Secondary | ICD-10-CM | POA: Diagnosis present

## 2023-09-27 DIAGNOSIS — Z452 Encounter for adjustment and management of vascular access device: Secondary | ICD-10-CM | POA: Diagnosis not present

## 2023-09-27 DIAGNOSIS — Z96651 Presence of right artificial knee joint: Secondary | ICD-10-CM | POA: Diagnosis present

## 2023-09-27 DIAGNOSIS — Z79899 Other long term (current) drug therapy: Secondary | ICD-10-CM | POA: Diagnosis not present

## 2023-09-27 DIAGNOSIS — Z7984 Long term (current) use of oral hypoglycemic drugs: Secondary | ICD-10-CM

## 2023-09-27 DIAGNOSIS — E1140 Type 2 diabetes mellitus with diabetic neuropathy, unspecified: Secondary | ICD-10-CM | POA: Diagnosis present

## 2023-09-27 DIAGNOSIS — Z7901 Long term (current) use of anticoagulants: Secondary | ICD-10-CM | POA: Diagnosis not present

## 2023-09-27 DIAGNOSIS — I10 Essential (primary) hypertension: Secondary | ICD-10-CM | POA: Diagnosis not present

## 2023-09-27 DIAGNOSIS — Z833 Family history of diabetes mellitus: Secondary | ICD-10-CM | POA: Diagnosis not present

## 2023-09-27 DIAGNOSIS — K429 Umbilical hernia without obstruction or gangrene: Secondary | ICD-10-CM | POA: Diagnosis not present

## 2023-09-27 DIAGNOSIS — Z8546 Personal history of malignant neoplasm of prostate: Secondary | ICD-10-CM | POA: Diagnosis not present

## 2023-09-27 DIAGNOSIS — I110 Hypertensive heart disease with heart failure: Secondary | ICD-10-CM | POA: Diagnosis present

## 2023-09-27 DIAGNOSIS — R531 Weakness: Secondary | ICD-10-CM | POA: Diagnosis not present

## 2023-09-27 DIAGNOSIS — E119 Type 2 diabetes mellitus without complications: Secondary | ICD-10-CM

## 2023-09-27 DIAGNOSIS — Z95810 Presence of automatic (implantable) cardiac defibrillator: Secondary | ICD-10-CM | POA: Diagnosis present

## 2023-09-27 DIAGNOSIS — Z794 Long term (current) use of insulin: Secondary | ICD-10-CM | POA: Diagnosis not present

## 2023-09-27 DIAGNOSIS — C169 Malignant neoplasm of stomach, unspecified: Secondary | ICD-10-CM | POA: Diagnosis not present

## 2023-09-27 DIAGNOSIS — I517 Cardiomegaly: Secondary | ICD-10-CM | POA: Diagnosis not present

## 2023-09-27 LAB — URINALYSIS, ROUTINE W REFLEX MICROSCOPIC
Bilirubin Urine: NEGATIVE
Glucose, UA: >=500 mg/dL — AB
Hgb urine dipstick: NEGATIVE
Ketones, ur: NEGATIVE mg/dL
Leukocytes,Ua: NEGATIVE
Nitrite: NEGATIVE
Protein, ur: NEGATIVE mg/dL
Specific Gravity, Urine: 1.008 (ref 1.005–1.030)
pH: 5 (ref 5.0–8.0)

## 2023-09-27 LAB — CBG MONITORING, ED: Glucose-Capillary: 147 mg/dL — ABNORMAL HIGH (ref 70–99)

## 2023-09-27 LAB — CBC WITH DIFFERENTIAL/PLATELET
Abs Immature Granulocytes: 0.05 K/uL (ref 0.00–0.07)
Basophils Absolute: 0 K/uL (ref 0.0–0.1)
Basophils Relative: 0 %
Eosinophils Absolute: 0.1 K/uL (ref 0.0–0.5)
Eosinophils Relative: 1 %
HCT: 33.6 % — ABNORMAL LOW (ref 39.0–52.0)
Hemoglobin: 10.4 g/dL — ABNORMAL LOW (ref 13.0–17.0)
Immature Granulocytes: 1 %
Lymphocytes Relative: 7 %
Lymphs Abs: 0.7 K/uL (ref 0.7–4.0)
MCH: 29.6 pg (ref 26.0–34.0)
MCHC: 31 g/dL (ref 30.0–36.0)
MCV: 95.7 fL (ref 80.0–100.0)
Monocytes Absolute: 0.7 K/uL (ref 0.1–1.0)
Monocytes Relative: 7 %
Neutro Abs: 8.3 K/uL — ABNORMAL HIGH (ref 1.7–7.7)
Neutrophils Relative %: 84 %
Platelets: 390 K/uL (ref 150–400)
RBC: 3.51 MIL/uL — ABNORMAL LOW (ref 4.22–5.81)
RDW: 15.4 % (ref 11.5–15.5)
WBC: 9.9 K/uL (ref 4.0–10.5)
nRBC: 0 % (ref 0.0–0.2)

## 2023-09-27 LAB — TROPONIN I (HIGH SENSITIVITY)
Troponin I (High Sensitivity): 5 ng/L (ref <18)
Troponin I (High Sensitivity): 4 ng/L (ref <18)

## 2023-09-27 LAB — COMPREHENSIVE METABOLIC PANEL WITH GFR
ALT: 14 U/L (ref 0–44)
AST: 26 U/L (ref 15–41)
Albumin: 3.6 g/dL (ref 3.5–5.0)
Alkaline Phosphatase: 42 U/L (ref 38–126)
Anion gap: 15 (ref 5–15)
BUN: 40 mg/dL — ABNORMAL HIGH (ref 8–23)
CO2: 17 mmol/L — ABNORMAL LOW (ref 22–32)
Calcium: 9 mg/dL (ref 8.9–10.3)
Chloride: 98 mmol/L (ref 98–111)
Creatinine, Ser: 1.63 mg/dL — ABNORMAL HIGH (ref 0.61–1.24)
GFR, Estimated: 44 mL/min — ABNORMAL LOW (ref >60)
Glucose, Bld: 145 mg/dL — ABNORMAL HIGH (ref 70–99)
Potassium: 4.8 mmol/L (ref 3.5–5.1)
Sodium: 130 mmol/L — ABNORMAL LOW (ref 135–145)
Total Bilirubin: 0.6 mg/dL (ref 0.0–1.2)
Total Protein: 6.8 g/dL (ref 6.5–8.1)

## 2023-09-27 LAB — BRAIN NATRIURETIC PEPTIDE: B Natriuretic Peptide: 176.4 pg/mL — ABNORMAL HIGH (ref 0.0–100.0)

## 2023-09-27 LAB — GLUCOSE, CAPILLARY: Glucose-Capillary: 91 mg/dL (ref 70–99)

## 2023-09-27 LAB — DIGOXIN LEVEL: Digoxin Level: 0.6 ng/mL — ABNORMAL LOW (ref 0.8–2.0)

## 2023-09-27 MED ORDER — INSULIN ASPART 100 UNIT/ML IJ SOLN: 0.0000 [IU] | Freq: Every day | INTRAMUSCULAR | Status: DC

## 2023-09-27 MED ORDER — POLYETHYLENE GLYCOL 3350 17 G PO PACK: 17.0000 g | PACK | Freq: Every day | ORAL | Status: DC | PRN

## 2023-09-27 MED ORDER — ATORVASTATIN CALCIUM 40 MG PO TABS
40.0000 mg | ORAL_TABLET | Freq: Every day | ORAL | Status: DC
  Administered 2023-09-28 – 2023-09-30 (×3): 40 mg via ORAL
  Filled 2023-09-27 (×3): qty 1

## 2023-09-27 MED ORDER — SODIUM CHLORIDE 0.9 % IV BOLUS
500.0000 mL | Freq: Once | INTRAVENOUS | Status: AC
Start: 2023-09-27 — End: 2023-09-27
  Administered 2023-09-27: 500 mL via INTRAVENOUS

## 2023-09-27 MED ORDER — SODIUM CHLORIDE 0.9 % IV BOLUS
500.0000 mL | Freq: Once | INTRAVENOUS | Status: AC
  Administered 2023-09-27: 500 mL via INTRAVENOUS

## 2023-09-27 MED ORDER — ONDANSETRON HCL 4 MG/2ML IJ SOLN: 4.0000 mg | Freq: Four times a day (QID) | INTRAMUSCULAR | Status: DC | PRN

## 2023-09-27 MED ORDER — RIVAROXABAN 20 MG PO TABS
20.0000 mg | ORAL_TABLET | Freq: Every day | ORAL | Status: DC
  Administered 2023-09-28 – 2023-09-29 (×2): 20 mg via ORAL
  Filled 2023-09-27 (×2): qty 1

## 2023-09-27 MED ORDER — ACETAMINOPHEN 325 MG PO TABS
650.0000 mg | ORAL_TABLET | Freq: Four times a day (QID) | ORAL | Status: DC | PRN
  Administered 2023-09-28 (×2): 650 mg via ORAL
  Filled 2023-09-27 (×2): qty 2

## 2023-09-27 MED ORDER — DIGOXIN 125 MCG PO TABS
0.0625 mg | ORAL_TABLET | Freq: Every day | ORAL | Status: DC
  Administered 2023-09-28 – 2023-09-30 (×3): 0.0625 mg via ORAL
  Filled 2023-09-27 (×3): qty 1

## 2023-09-27 MED ORDER — PANTOPRAZOLE SODIUM 40 MG PO TBEC
40.0000 mg | DELAYED_RELEASE_TABLET | Freq: Two times a day (BID) | ORAL | Status: DC
Start: 2023-09-27 — End: 2023-09-30
  Administered 2023-09-27 – 2023-09-30 (×6): 40 mg via ORAL
  Filled 2023-09-27 (×6): qty 1

## 2023-09-27 MED ORDER — AMIODARONE HCL 200 MG PO TABS
200.0000 mg | ORAL_TABLET | Freq: Two times a day (BID) | ORAL | Status: AC
  Administered 2023-09-27 – 2023-09-29 (×5): 200 mg via ORAL
  Filled 2023-09-27 (×5): qty 1

## 2023-09-27 MED ORDER — INSULIN ASPART 100 UNIT/ML IJ SOLN
0.0000 [IU] | Freq: Three times a day (TID) | INTRAMUSCULAR | Status: DC
  Administered 2023-09-28 – 2023-09-30 (×4): 1 [IU] via SUBCUTANEOUS

## 2023-09-27 MED ORDER — ACETAMINOPHEN 650 MG RE SUPP: 650.0000 mg | Freq: Four times a day (QID) | RECTAL | Status: DC | PRN

## 2023-09-27 MED ORDER — ONDANSETRON HCL 4 MG PO TABS
4.0000 mg | ORAL_TABLET | Freq: Four times a day (QID) | ORAL | Status: DC | PRN
Start: 2023-09-27 — End: 2023-09-30

## 2023-09-27 MED ORDER — SODIUM CHLORIDE 0.9 % IV SOLN: INTRAVENOUS | Status: DC

## 2023-09-27 MED ORDER — AMIODARONE HCL 200 MG PO TABS
200.0000 mg | ORAL_TABLET | Freq: Every day | ORAL | Status: DC
  Administered 2023-09-30: 200 mg via ORAL
  Filled 2023-09-27: qty 1

## 2023-09-27 NOTE — ED Provider Triage Note (Signed)
 Emergency Medicine Provider Triage Evaluation Note  Jon Williams , a 73 y.o. male  was evaluated in triage.  Pt complains of hypotension.  Patient reportedly was called by his cardiologist office due to their concern that his blood pressure was remaining low based on reports that they were seeing.  Patient reports that he has had ongoing stomach pain but has also felt some increasing feelings of lightheadedness and slightly off balance.  Denies any hematemesis, medic easy, or melanotic stools.  Not on blood thinners.  Denies any significant chest pain.  No recent vomiting or diarrhea.  Review of Systems  Positive: As above Negative: As above  Physical Exam  BP (!) 78/53 (BP Location: Left Arm)   Pulse 61   Temp 97.9 F (36.6 C)   Resp 18   SpO2 100%  Gen:   Awake, no distress, awake and alert and interacting appropriately Resp:  Normal effort  MSK:   Moves extremities without difficulty  Other:    Medical Decision Making  Medically screening exam initiated at 2:51 PM.  Appropriate orders placed.  [name] was informed that the remainder of the evaluation will be completed by another provider, this initial triage assessment does not replace that evaluation, and the importance of remaining in the ED until their evaluation is complete.  With a history of CHF.  No obvious signs to suggest fluid overload as there is no lower extremity swelling or edema.  5 mL liter bolus initiated given patient's hypotension.   Saffron Busey A, PA-C 09/27/23 1452

## 2023-09-27 NOTE — ED Provider Notes (Signed)
  EMERGENCY DEPARTMENT AT [facility] Provider Note   CSN: [id number] Arrival date & time: 09/27/23  1423     Patient presents with: Hypotension   Jon Williams is a 73 y.o. male with an extensive past medical history including combined diastolic and systolic chronic heart failure with EF of 20% status post ICD placement, permanent A-fib on Xarelto , history of renal insufficiency hypertension type 2 diabetes coronary artery disease nonischemic cardiomyopathy.  Patient reports to the emergency department with sudden onset of periumbilical abdominal pain starting around 11:30 AM.  Patient is an extremely poor historian and very bad at describing his brain.  When I asked the patient if it is aching, stabbing, burning, sharp.  He responds yeah all of that.  Patient states that his pain does not radiate.  He denies nausea or vomiting.  He does report dark stools but states that he is on iron .  He denies having reflux although it appears that he takes pantoprazole  daily.  He denies chest pain he does state that he feels lightheaded and shortness of breath.  Patient is unable to tell me whether this is exertional.  He does report that he took all of his blood pressure medications today.  He is unsure if he had any medication dose changes recently.  He denies taking Lasix  recently.  The patient also reports that his blood pressure is monitored remotely by a nurse at his primary care physician's office.  When he took his blood pressure today she called to tell him that it was extremely low and he needed to come to the emergency department.  He states that is unusual for him.  He denies any cough, fever, chills, nausea, vomiting or diarrhea recently.    HPI     Prior to Admission medications   Medication Sig Start Date End Date Taking? Authorizing Provider  Accu-Chek Softclix Lancets lancets Use to check your blood sugar finger stick once a day Dx Code E11.9 90 days 05/27/23      amiodarone  (PACERONE ) 200 MG tablet Take 1 tablet (200 mg total) by mouth 2 (two) times daily for 14 days, THEN 1 tablet (200 mg total) daily. 09/16/23 03/28/24  Glena Harlene HERO, FNP  atorvastatin  (LIPITOR) 40 MG tablet Take 1 tablet (40 mg total) by mouth daily. 05/20/23   Glena Harlene HERO, FNP  Biotin 1000 MCG tablet Take 1,000 mcg by mouth daily with breakfast.    [provider]  Blood Glucose Monitoring Suppl (ACCU-CHEK GUIDE) w/Device KIT as directed to check blood sugar once a day 05/27/23     cetirizine  (ZYRTEC ) 10 MG tablet Take 1 tablet (10 mg total) by mouth daily. 08/30/23   Mannie Fairy DASEN, DO  cholecalciferol  (VITAMIN D3) 25 MCG (1000 UT) tablet Take 1,000 Units by mouth daily with breakfast.    [provider]  digoxin  (LANOXIN ) 0.125 MG tablet Take 1/2 tablet (0.0625mg  total) by mouth daily. 03/15/23   Milford, Harlene HERO, FNP  empagliflozin  (JARDIANCE ) 10 MG TABS tablet Take 1 tablet (10 mg total) by mouth daily. 09/12/23     EPINEPHrine  0.3 mg/0.3 mL IJ SOAJ injection Inject 0.3 mg into the muscle as needed for anaphylaxis. 07/31/23   [name] Orlan HERO, DO  famotidine  (PEPCID ) 40 MG tablet Take 1 tablet (40 mg total) by mouth daily. 08/30/23 09/29/23  Mannie Fairy T, DO  ferrous sulfate 325 (65 FE) MG tablet Take 325 mg by mouth daily with breakfast.    [provider]  furosemide  (LASIX ) 40 MG tablet Take 1 tablet (40 mg total) by mouth daily. 08/26/23   Milford, Harlene HERO, FNP  gabapentin  (NEURONTIN ) 100 MG capsule Take 1 capsule (100 mg total) by mouth 2 (two) times daily. 09/18/23   Hanford Powell BRAVO, NP  glucose blood (ACCU-CHEK GUIDE TEST) test strip Use to check blood sugars once daily. 05/27/23   [name], MD  HYDROcodone -acetaminophen  (NORCO/VICODIN) 5-325 MG tablet Take 1 tablet by mouth every 4 (four) hours as needed. 08/19/23   Elnor Savant A, DO  ipratropium (ATROVENT ) 0.03 % nasal spray Place 1-2 sprays into both nostrils 2 (two) times daily as  needed (nasal drainage). 07/31/23   [name] Orlan HERO, DO  ketoconazole  (NIZORAL ) 2 % cream Apply 1 Application topically 2 (two) times daily. 09/26/22     lidocaine -prilocaine  (EMLA ) cream Apply 1 Application topically as needed. 04/03/23   Lanny Callander, MD  metFORMIN  (GLUCOPHAGE -XR) 500 MG 24 hr tablet Take 1 tablet (500 mg total) by mouth daily with evening meal. 09/23/23     metoprolol  succinate (TOPROL -XL) 25 MG 24 hr tablet Take 1 tablet (25 mg total) by mouth daily. 03/15/23   Glena Harlene HERO, FNP  multivitamin (ONE-A-DAY MEN'S) TABS tablet Take 1 tablet by mouth daily with breakfast.    [provider]  Naftifine HCl (NAFTIN) 2 % GEL Apply 1 application  topically every Monday, Wednesday, and Friday.    [provider]  pantoprazole  (PROTONIX ) 40 MG tablet Take 1 tablet (40 mg total) by mouth 2 (two) times daily. 01/04/23     potassium chloride  (KLOR-CON ) 10 MEQ tablet Take 2 tablets (20 mEq total) by mouth daily. 08/12/23   Milford, Harlene HERO, FNP  rivaroxaban  (XARELTO ) 20 MG TABS tablet Take 1 tablet (20 mg total) by mouth daily with supper. 03/15/23   Milford, Harlene HERO, FNP  spironolactone  (ALDACTONE ) 25 MG tablet Take 1 tablet (25 mg total) by mouth daily. 03/15/23   Milford, Harlene HERO, FNP  sucralfate  (CARAFATE ) 1 g tablet Take 1 tablet (1 g total) by mouth with breakfast, with lunch, and with evening meal for 7 days. 08/19/23 09/13/23  Elnor Savant A, DO  Tavaborole 5 % SOLN Apply 1 application  topically daily as needed (Nail on left hand).    [provider]  valsartan  (DIOVAN ) 40 MG tablet Take 1 tablet (40 mg total) by mouth 2 (two) times daily. 08/13/23 08/12/24  Rolan Ezra RAMAN, MD  prochlorperazine  (COMPAZINE ) 10 MG tablet Take 1 tablet (10 mg total) by mouth every 6 (six) hours as needed (Nausea or vomiting). 02/15/21 05/12/21  Lanny Callander, MD    Allergies: Aspirin , Sacubitril -valsartan , Sulfa antibiotics, and Ace inhibitors    Review of Systems  Updated Vital Signs BP  (!) 78/53 (BP Location: Left Arm)   Pulse 61   Temp 97.9 F (36.6 C)   Resp 18   SpO2 100%   Physical Exam Vitals and nursing note reviewed.  Constitutional:      General: He is not in acute distress.    Appearance: He is well-developed. He is not ill-appearing, toxic-appearing or diaphoretic.  HENT:     Head: Normocephalic and atraumatic.  Eyes:     General: No scleral icterus.    Conjunctiva/sclera: Conjunctivae normal.  Cardiovascular:     Rate and Rhythm: Normal rate and regular rhythm.     Heart sounds: Normal heart sounds.  Pulmonary:     Effort: Pulmonary effort is normal. No respiratory distress.     Breath  sounds: Normal breath sounds.  Abdominal:     General: Bowel sounds are normal. There is no distension.     Palpations: Abdomen is soft.     Tenderness: There is no abdominal tenderness. There is no guarding or rebound.     Hernia: No hernia is present.  Musculoskeletal:     Cervical back: Normal range of motion and neck supple.  Skin:    General: Skin is warm and dry.  Neurological:     Mental Status: He is alert.  Psychiatric:        Behavior: Behavior normal.     (all labs ordered are listed, but only abnormal results are displayed) Labs Reviewed  CBC WITH DIFFERENTIAL/PLATELET - Abnormal; Notable for the following components:      Result Value   RBC 3.51 (*)    Hemoglobin 10.4 (*)    HCT 33.6 (*)    Neutro Abs 8.3 (*)    All other components within normal limits  CBG MONITORING, ED - Abnormal; Notable for the following components:   Glucose-Capillary 147 (*)    All other components within normal limits  COMPREHENSIVE METABOLIC PANEL WITH GFR  URINALYSIS, ROUTINE W REFLEX MICROSCOPIC    EKG: None  Radiology: No results found.   .Critical Care  Performed by: Arloa Chroman, PA-C Authorized by: Arloa Chroman, PA-C   Critical care provider statement:    Critical care time (minutes):  60   Critical care time was exclusive of:   Separately billable procedures and treating other patients   Critical care was necessary to treat or prevent imminent or life-threatening deterioration of the following conditions:  Dehydration   Critical care was time spent personally by me on the following activities:  Development of treatment plan with patient or surrogate, discussions with consultants, evaluation of patient's response to treatment, examination of patient, ordering and review of laboratory studies, ordering and review of radiographic studies, ordering and performing treatments and interventions, pulse oximetry, re-evaluation of patient's condition, review of old charts, obtaining history from patient or surrogate and interpretation of cardiac output measurements    Medications Ordered in the ED  sodium chloride  0.9 % bolus 500 mL (500 mLs Intravenous New Bag/Given 09/27/23 1517)    Clinical Course as of 09/28/23 1125  Fri Sep 27, 2023  1602 Creatinine(!): 1.63 +AKI [HN]  1621 Creatinine(!): 1.63 [AH]  1621 Bedside US  performed by Dr. Franklyn- IVC flat- recommends add'l 500 NS,  [AH]  1701 BP(!): 91/53 BP mildly improving w/ fluid [HN]    Clinical Course User Index [AH] Arloa Chroman, PA-C [HN] Franklyn Sid SAILOR, MD                                 Medical Decision Making Amount and/or Complexity of Data Reviewed Labs: ordered. Decision-making details documented in ED Course. Radiology: ordered. ECG/medicine tests: ordered.  Risk Decision regarding hospitalization.   This patient presents to the ED for concern of hypotension and light headedness, this involves an extensive number of treatment options, and is a complaint that carries with it a high risk of complications and morbidity.  Differential diagnosis includes but is not limited to dehydration pneumonia, overmedication, renal failure, worsening cardiac function, GI bleed, less likely infection or sepsis.  Co morbidities: .  has a past medical history of  AICD (automatic cardioverter/defibrillator) present (05/25/2016), Angio-edema, Bunion, right, Chronic combined systolic and diastolic CHF (congestive heart failure) (HCC), Coronary  artery disease involving native coronary artery without angina pectoris (04/17/2016), Diabetes mellitus without complication (HCC), Gastric cancer (HCC), History of atrial fibrillation, History of atrial flutter, History of cardiomegaly (06/07/2016), History of colon polyps (06/28/2017), Hypertension, LBBB (left bundle branch block), Nausea vomiting and diarrhea (04/04/2021), NICM (nonischemic cardiomyopathy) (HCC), Other secondary pulmonary hypertension (HCC) (04/17/2016), Prostate cancer (HCC) (2019), and Sigmoid diverticulosis (06/28/2017).   Social Determinants of Health:  SDOH Screenings   Food Insecurity: No Food Insecurity (09/27/2023)  Housing: Unknown (09/27/2023)  Transportation Needs: No Transportation Needs (09/27/2023)  Utilities: Not At Risk (09/27/2023)  Alcohol Screen: Low Risk  (09/26/2021)  Depression (PHQ2-9): Low Risk  (03/08/2023)  Financial Resource Strain: Low Risk  (12/26/2021)  Physical Activity: Insufficiently Active (09/26/2021)  Social Connections: Unknown (09/28/2023)  Stress: No Stress Concern Present (09/26/2021)  Tobacco Use: Low Risk  (09/27/2023)     Additional history:  {Additional history obtained from patient's wife at bedside {External records from outside source obtained and reviewed including previous echocardiogram reports constitutional principles of  Lab Tests:  I Ordered, and personally interpreted labs.  The pertinent results include: Urine appears contaminated but not infected, troponin within normal limits Dig level within normal limits, BNP 176 not significantly elevated Creatinine 1.63 up from baseline 1.18 Hemoglobin 10.4 baseline blood  Imaging Studies:  I ordered imaging studies including  cxr, ct abd/ pv I independently visualized and interpreted imaging  which showed no acute findings I agree with the radiologist interpretation  Cardiac Monitoring/ECG:  The patient was maintained on a cardiac monitor.  I personally viewed and interpreted the cardiac monitored which showed an underlying rhythm of:    EKG shows ventricular pace rhythm at a rate of 64 Medicines ordered and prescription drug management:  I ordered medication including  1000 ml normal saline for hypotension Reevaluation of the patient after these medicines showed that the patient improved I have reviewed the patients home medicines and have made adjustments as needed  Test Considered:    Critical Interventions:  fluid resuscitations  Consultations Obtained:  Dr Pearlean for admission Problem List / ED Course:     ICD-10-CM   1. AKI (acute kidney injury) (HCC)  N17.9     2. Hypotension, unspecified hypotension type  I95.9       MDM: Pt with hypotension and sig dehydration. I suspect this is secondary to medications   Dispostion:  After consideration of the diagnostic results and the patients response to treatment, I feel that the patent would benefit from admissio.      Final diagnoses:  None    ED Discharge Orders     None          Arloa Chroman, PA-C 09/28/23 1813    Franklyn Sid SAILOR, MD 09/28/23 RONOLD

## 2023-09-27 NOTE — ED Notes (Signed)
 Patient placed onto cardiac monitor. PA is at bedside at this time.

## 2023-09-27 NOTE — H&P (Signed)
 History and Physical    Jon Williams FMW:[id number] DOB: 1951-01-16 DOA: 09/27/2023  PCP: [name], MD   Patient coming from: Home  I have personally briefly reviewed patient's old medical records in [facility]  Chief Complaint: Home  HPI: Jon Williams is a 73 y.o. male with medical history significant for systolic CHF, diabetes mellitus, hypertension, atrial fibrillation, LBBB. Patient presented to the ED with complaints of dizziness and blurry vision, symptoms started 5 days ago-on the 10th when he was in church on Sunday.  He was standing when he felt dizzy like he was going to pass out.  He has been having low blood pressure readings since that day-with systolic mostly in the 80s, but today got the lowest reading of 73/44.  Patient and his wife confirms that he did not have any slurred speech.  No weakness of extremities.  No facial asymmetry.  He has been taking his blood pressure medications despite the low readings - metoprolol , valsartan , and diuretics Lasix  40 mg twice daily.  No recent change in his blood pressure medications. He has maintained good oral intake, no vomiting no diarrhea.  Reports some mid abdominal pain for the past few days, no urinary symptoms.  No falls.  ED Course: Tmax 98, heart rate 59-108, respiratory 12-18.  Blood pressure systolic down to 21/46, improved with 1 L bolus, now 110s to 120s.  O2 sats greater than 94% on room air. Creatinine elevated 1.63.  Troponin 5 > 4.  Digoxin  0.6. BNP 176.4. CT abdomen and pelvis without contrast-no acute abnormality.  2 view chest x-ray-no acute abnormality. Hospitalist admit for AKI, hypotension.  Review of Systems: As per HPI all other systems reviewed and negative.  Past Medical History:  Diagnosis Date   AICD (automatic cardioverter/defibrillator) present 05/25/2016   biv icd   Angio-edema    Bunion, right    Chronic combined systolic and diastolic CHF (congestive heart failure) (HCC)    Echo  1/18: Mild conc LVH, EF 15-20, severe diff HK, inf and inf-septal AK, Gr 3 DD, mild to mod MR, severe LAE, mod reduced RVSF, mod RAE, mild TR, PASP 50   Coronary artery disease involving native coronary artery without angina pectoris 04/17/2016   LHC 1/18: pLCx 30, mLCx 20, mRCA 40, dRCA 20, LVEDP 23, mean RA 8, PA 42/20, PCWP 17   Diabetes mellitus without complication (HCC)    Gastric cancer (HCC)    History of atrial fibrillation    History of atrial flutter    History of cardiomegaly 06/07/2016   Noted on CXR   History of colon polyps 06/28/2017   Noted on colonoscopy   Hypertension    LBBB (left bundle branch block)    Nausea vomiting and diarrhea 04/04/2021   NICM (nonischemic cardiomyopathy) (HCC)    Echo 1/18:  Mild conc LVH, EF 15-20, severe diff HK, inf and inf-septal AK, Gr 3 DD, mild to mod MR, severe LAE, mod reduced RVSF, mod RAE, mild TR, PASP 50   Other secondary pulmonary hypertension (HCC) 04/17/2016   Prostate cancer (HCC) 2019   Sigmoid diverticulosis 06/28/2017   Noted on colonoscopy    Past Surgical History:  Procedure Laterality Date   BIOPSY  12/01/2020   Procedure: BIOPSY;  Surgeon: Rollin Dover, MD;  Location: [facility] ENDOSCOPY;  Service: Endoscopy;;   BIOPSY  06/09/2021   Procedure: BIOPSY;  Surgeon: Rollin Dover, MD;  Location: [facility] ENDOSCOPY;  Service: Gastroenterology;;   BIOPSY  06/29/2022   Procedure:  BIOPSY;  Surgeon: Rollin Dover, MD;  Location: [facility] ENDOSCOPY;  Service: Gastroenterology;;   BIV ICD INSERTION CRT-D N/A 05/25/2016   Procedure: BiV ICD Insertion CRT-D;  Surgeon: Danelle LELON Birmingham, MD;  Location: [facility] INVASIVE CV LAB;  Service: Cardiovascular;  Laterality: N/A;   CARDIAC CATHETERIZATION N/A 03/02/2016   Procedure: Right/Left Heart Cath and Coronary Angiography;  Surgeon: Lonni Hanson, MD;  Location: [facility] INVASIVE CV LAB;  Service: Cardiovascular;  Laterality: N/A;   CARDIOVERSION N/A 07/17/2016   Procedure: Cardioversion;  Surgeon: Birmingham Danelle LELON,  MD;  Location: [facility] INVASIVE CV LAB;  Service: Cardiovascular;  Laterality: N/A;   COLONOSCOPY WITH PROPOFOL  N/A 06/28/2017   Procedure: COLONOSCOPY WITH PROPOFOL ;  Surgeon: Rollin Dover, MD;  Location: [facility] ENDOSCOPY;  Service: Endoscopy;  Laterality: N/A;   colonscopy  2009   ESOPHAGOGASTRODUODENOSCOPY N/A 12/01/2020   Procedure: ESOPHAGOGASTRODUODENOSCOPY (EGD);  Surgeon: Rollin Dover, MD;  Location: [facility] ENDOSCOPY;  Service: Endoscopy;  Laterality: N/A;  IDA/guaiac positive stools   ESOPHAGOGASTRODUODENOSCOPY (EGD) WITH PROPOFOL  N/A 12/16/2020   Procedure: ESOPHAGOGASTRODUODENOSCOPY (EGD) WITH PROPOFOL ;  Surgeon: Rollin Dover, MD;  Location: [facility] ENDOSCOPY;  Service: Endoscopy;  Laterality: N/A;   ESOPHAGOGASTRODUODENOSCOPY (EGD) WITH PROPOFOL  N/A 06/09/2021   Procedure: ESOPHAGOGASTRODUODENOSCOPY (EGD) WITH PROPOFOL ;  Surgeon: Rollin Dover, MD;  Location: [facility] ENDOSCOPY;  Service: Gastroenterology;  Laterality: N/A;   ESOPHAGOGASTRODUODENOSCOPY (EGD) WITH PROPOFOL  N/A 06/29/2022   Procedure: ESOPHAGOGASTRODUODENOSCOPY (EGD) WITH PROPOFOL ;  Surgeon: Rollin Dover, MD;  Location: [facility] ENDOSCOPY;  Service: Gastroenterology;  Laterality: N/A;   GOLD SEED IMPLANT N/A 12/24/2017   Procedure: GOLD SEED IMPLANT, TRANSERINEAL;  Surgeon: Nieves Cough, MD;  Location: [facility] ORS;  Service: Urology;  Laterality: N/A;   INSERT / REPLACE / REMOVE PACEMAKER     LEAD REVISION  10/10/2018   LEAD REVISION/REPAIR N/A 10/10/2018   Procedure: LEAD REVISION/REPAIR;  Surgeon: Birmingham Danelle LELON, MD;  Location: [facility] INVASIVE CV LAB;  Service: Cardiovascular;  Laterality: N/A;   POLYPECTOMY  06/28/2017   Procedure: POLYPECTOMY;  Surgeon: Rollin Dover, MD;  Location: [facility] ENDOSCOPY;  Service: Endoscopy;;  ascending and descending colon polyp   PORTACATH PLACEMENT Right 12/23/2020   Procedure: INSERTION PORT-A-CATH;  Surgeon: Dasie Leonor CROME, MD;  Location: [facility] OR;  Service: General;  Laterality: Right;   PROSTATE BIOPSY  02/20/2017    RIGHT HEART CATH N/A 07/25/2023   Procedure: RIGHT HEART CATH;  Surgeon: Rolan Ezra RAMAN, MD;  Location: [facility] INVASIVE CV LAB;  Service: Cardiovascular;  Laterality: N/A;   RIGHT/LEFT HEART CATH AND CORONARY ANGIOGRAPHY N/A 10/30/2022   Procedure: RIGHT/LEFT HEART CATH AND CORONARY ANGIOGRAPHY;  Surgeon: Rolan Ezra RAMAN, MD;  Location: [facility] INVASIVE CV LAB;  Service: Cardiovascular;  Laterality: N/A;   SPACE OAR INSTILLATION N/A 12/24/2017   Procedure: SPACE OAR INSTILLATION;  Surgeon: Nieves Cough, MD;  Location: [facility] ORS;  Service: Urology;  Laterality: N/A;   TOTAL KNEE ARTHROPLASTY Right 08/16/2017   Procedure: RIGHT TOTAL KNEE ARTHROPLASTY;  Surgeon: Shari Sieving, MD;  Location: [facility] OR;  Service: Orthopedics;  Laterality: Right;   UPPER ESOPHAGEAL ENDOSCOPIC ULTRASOUND (EUS) N/A 12/16/2020   Procedure: UPPER ESOPHAGEAL ENDOSCOPIC ULTRASOUND (EUS);  Surgeon: Rollin Dover, MD;  Location: [facility]OSCOPY;  Service: Endoscopy;  Laterality: N/A;     reports that he has never smoked. He has never been exposed to tobacco smoke. He has never used smokeless tobacco. He reports that he does not drink alcohol and does not use drugs.  Allergies  Allergen Reactions   Aspirin  Anaphylaxis and Hives  Sacubitril -Valsartan  Swelling    lip swelling, angioedema   Sulfa Antibiotics Anaphylaxis, Hives, Swelling and Other (See Comments)    Swollen lips   Ace Inhibitors     Possible angioedema    Family History  Problem Relation Age of Onset   Hypertension Mother    Heart disease Mother    Diabetes Mother    Diabetes Father    Hypertension Father    Cancer Father        lung cancer   Healthy Sister    Heart attack Brother    Heart disease Brother 32       + tobacco   Healthy Brother     Prior to Admission medications   Medication Sig Start Date End Date Taking? Authorizing Provider  amiodarone  (PACERONE ) 200 MG tablet Take 1 tablet (200 mg total) by mouth 2 (two) times daily for 14 days, THEN 1  tablet (200 mg total) daily. 09/16/23 03/28/24 Yes Milford, Harlene HERO, FNP  atorvastatin  (LIPITOR) 40 MG tablet Take 1 tablet (40 mg total) by mouth daily. 05/20/23  Yes Milford, Harlene HERO, FNP  Biotin 1000 MCG tablet Take 1,000 mcg by mouth daily with breakfast.   Yes [provider]  cetirizine  (ZYRTEC ) 10 MG tablet Take 1 tablet (10 mg total) by mouth daily. 08/30/23  Yes Mannie Pac T, DO  cholecalciferol  (VITAMIN D3) 25 MCG (1000 UT) tablet Take 1,000 Units by mouth daily with breakfast.   Yes [provider]  clotrimazole-betamethasone (LOTRISONE) cream Apply 1 Application topically 2 (two) times daily. 09/19/23  Yes [provider]  digoxin  (LANOXIN ) 0.125 MG tablet Take 1/2 tablet (0.0625mg  total) by mouth daily. 03/15/23  Yes Milford, Harlene HERO, FNP  empagliflozin  (JARDIANCE ) 10 MG TABS tablet Take 1 tablet (10 mg total) by mouth daily. 09/12/23  Yes   EPINEPHrine  0.3 mg/0.3 mL IJ SOAJ injection Inject 0.3 mg into the muscle as needed for anaphylaxis. 07/31/23  Yes [name] Orlan HERO, DO  ferrous sulfate 325 (65 FE) MG tablet Take 325 mg by mouth daily with breakfast.   Yes [provider]  furosemide  (LASIX ) 40 MG tablet Take 1 tablet (40 mg total) by mouth daily. 08/26/23  Yes Milford, Harlene HERO, FNP  gabapentin  (NEURONTIN ) 100 MG capsule Take 1 capsule (100 mg total) by mouth 2 (two) times daily. 09/18/23  Yes Boscia, Heather E, NP  ipratropium (ATROVENT ) 0.03 % nasal spray Place 1-2 sprays into both nostrils 2 (two) times daily as needed (nasal drainage). 07/31/23  Yes [name] Orlan HERO, DO  ketoconazole  (NIZORAL ) 2 % cream Apply 1 Application topically 2 (two) times daily. Patient taking differently: Apply 1 Application topically 2 (two) times daily as needed for irritation. 09/26/22  Yes   lidocaine -prilocaine  (EMLA ) cream Apply 1 Application topically as needed. 04/03/23  Yes Lanny Callander, MD  metFORMIN  (GLUCOPHAGE -XR) 500 MG 24 hr tablet Take 1 tablet (500 mg total) by mouth  daily with evening meal. 09/23/23  Yes   metoprolol  succinate (TOPROL -XL) 25 MG 24 hr tablet Take 1 tablet (25 mg total) by mouth daily. 03/15/23  Yes Milford, Harlene HERO, FNP  multivitamin (ONE-A-DAY MEN'S) TABS tablet Take 1 tablet by mouth daily with breakfast.   Yes [provider]  pantoprazole  (PROTONIX ) 40 MG tablet Take 1 tablet (40 mg total) by mouth 2 (two) times daily. 01/04/23  Yes   potassium chloride  (KLOR-CON ) 10 MEQ tablet Take 2 tablets (20 mEq total) by mouth daily. Patient taking differently: Take 10 mEq by mouth  2 (two) times daily. 08/12/23  Yes Milford, Harlene HERO, FNP  rivaroxaban  (XARELTO ) 20 MG TABS tablet Take 1 tablet (20 mg total) by mouth daily with supper. 03/15/23  Yes Milford, Harlene HERO, FNP  spironolactone  (ALDACTONE ) 25 MG tablet Take 1 tablet (25 mg total) by mouth daily. 03/15/23  Yes Milford, Harlene HERO, FNP  valsartan  (DIOVAN ) 40 MG tablet Take 1 tablet (40 mg total) by mouth 2 (two) times daily. 08/13/23 08/12/24 Yes Rolan Ezra RAMAN, MD  Accu-Chek Softclix Lancets lancets Use to check your blood sugar finger stick once a day Dx Code E11.9 90 days 05/27/23     Blood Glucose Monitoring Suppl (ACCU-CHEK GUIDE) w/Device KIT as directed to check blood sugar once a day 05/27/23     famotidine  (PEPCID ) 40 MG tablet Take 1 tablet (40 mg total) by mouth daily. Patient not taking: Reported on 09/27/2023 08/30/23 09/29/23  Mannie Pac T, DO  glucose blood (ACCU-CHEK GUIDE TEST) test strip Use to check blood sugars once daily. 05/27/23   [name], MD  HYDROcodone -acetaminophen  (NORCO/VICODIN) 5-325 MG tablet Take 1 tablet by mouth every 4 (four) hours as needed. Patient not taking: Reported on 09/27/2023 08/19/23   Elnor Savant A, DO  sucralfate  (CARAFATE ) 1 g tablet Take 1 tablet (1 g total) by mouth with breakfast, with lunch, and with evening meal for 7 days. Patient not taking: Reported on 09/27/2023 08/19/23 09/13/23  Elnor Savant A, DO  prochlorperazine  (COMPAZINE ) 10 MG  tablet Take 1 tablet (10 mg total) by mouth every 6 (six) hours as needed (Nausea or vomiting). 02/15/21 05/12/21  Lanny Callander, MD    Physical Exam: Vitals:   09/27/23 2030 09/27/23 2100 09/27/23 2131 09/27/23 2223  BP: 119/74 112/72 126/82 118/75  Pulse: (!) 106 62  65  Resp: 12 18 17 17   Temp:   (!) 97.4 F (36.3 C) 97.8 F (36.6 C)  TempSrc:   Oral Oral  SpO2: 100% 100% 94% 95%    Constitutional: NAD, calm, comfortable Vitals:   09/27/23 2030 09/27/23 2100 09/27/23 2131 09/27/23 2223  BP: 119/74 112/72 126/82 118/75  Pulse: (!) 106 62  65  Resp: 12 18 17 17   Temp:   (!) 97.4 F (36.3 C) 97.8 F (36.6 C)  TempSrc:   Oral Oral  SpO2: 100% 100% 94% 95%   Eyes: PERRL, lids and conjunctivae normal ENMT: Mucous membranes are moist.  Neck: normal, supple, no masses, no thyromegaly Respiratory: clear to auscultation bilaterally, no wheezing, no crackles. Normal respiratory effort. No accessory muscle use.  Cardiovascular: Regular rate and rhythm, no murmurs / rubs / gallops. No extremity edema. 2+ pedal pulses. No carotid bruits.  Abdomen: no tenderness on palpation, no masses palpated. No hepatosplenomegaly.   Musculoskeletal: no clubbing / cyanosis. No joint deformity upper and lower extremities.  Skin: no rashes, lesions, ulcers. No induration Neurologic: No facial asymmetry, 5/5 strength in all extremities, speech fluent Psychiatric: Normal judgment and insight. Alert and oriented x 3. Normal mood.   Labs on Admission: I have personally reviewed following labs and imaging studies  CBC: Recent Labs  Lab 09/27/23 1451  WBC 9.9  NEUTROABS 8.3*  HGB 10.4*  HCT 33.6*  MCV 95.7  PLT 390   Basic Metabolic Panel: Recent Labs  Lab 09/27/23 1451  NA 130*  K 4.8  CL 98  CO2 17*  GLUCOSE 145*  BUN 40*  CREATININE 1.63*  CALCIUM  9.0   GFR: CrCl cannot be calculated (Unknown ideal weight.). Liver Function  Tests: Recent Labs  Lab 09/27/23 1451  AST 26  ALT 14   ALKPHOS 42  BILITOT 0.6  PROT 6.8  ALBUMIN 3.6   CBG: Recent Labs  Lab 09/27/23 1454  GLUCAP 147*   Urine analysis:    Component Value Date/Time   COLORURINE STRAW (A) 09/27/2023 1913   APPEARANCEUR CLEAR 09/27/2023 1913   LABSPEC 1.008 09/27/2023 1913   PHURINE 5.0 09/27/2023 1913   GLUCOSEU >=500 (A) 09/27/2023 1913   HGBUR NEGATIVE 09/27/2023 1913   BILIRUBINUR NEGATIVE 09/27/2023 1913   KETONESUR NEGATIVE 09/27/2023 1913   PROTEINUR NEGATIVE 09/27/2023 1913   NITRITE NEGATIVE 09/27/2023 1913   LEUKOCYTESUR NEGATIVE 09/27/2023 1913    Radiological Exams on Admission: CT ABDOMEN PELVIS WO CONTRAST Result Date: 09/27/2023 CLINICAL DATA:  Abdominal pain, acute, nonlocalized. History of gastric cancer. * Tracking Code: BO * EXAM: CT ABDOMEN AND PELVIS WITHOUT CONTRAST TECHNIQUE: Multidetector CT imaging of the abdomen and pelvis was performed following the standard protocol without IV contrast. RADIATION DOSE REDUCTION: This exam was performed according to the departmental dose-optimization program which includes automated exposure control, adjustment of the mA and/or kV according to patient size and/or use of iterative reconstruction technique. COMPARISON:  CT scan abdomen and pelvis from 08/30/2023. FINDINGS: Lower chest: The lung bases are clear. No pleural effusion. The heart is mildly enlarged in size. No pericardial effusion. Pacemaker leads noted. Hepatobiliary: The liver is normal in size. Non-cirrhotic configuration. No suspicious mass. There is a 8 x 9 mm hypoattenuating structure in the right hepatic lobe, segment 5, previously characterized as a simple cyst. No intrahepatic or extrahepatic bile duct dilation. Contracted gallbladder. There are dependent hyperattenuating areas, which may represent concentrated bile versus sludge. No calcified gallstones. No imaging signs of acute cholecystitis. Normal gallbladder wall thickness. No pericholecystic inflammatory changes.  Pancreas: Unremarkable. No pancreatic ductal dilatation or surrounding inflammatory changes. Spleen: Within normal limits. No focal lesion. Adrenals/Urinary Tract: There is a stable 1.4 x 1.9 cm left adrenal adenoma. Diffuse thickening of right adrenal gland without focal nodule is also grossly similar to the prior study. No suspicious renal mass within the limitations of this unenhanced exam. No nephroureterolithiasis or obstructive uropathy. Unremarkable urinary bladder. Stomach/Bowel: There is a tiny sliding hiatal hernia. There is irregular thickening of the wall of distal stomach body/pylorus, not well evaluated on the current exam but grossly similar to the prior study and compatible with patient's known history of gastric cancer. No disproportionate dilation of the small or large bowel loops. No evidence of abnormal bowel wall thickening or inflammatory changes. The appendix is unremarkable. There are scattered diverticula mainly in the left hemi colon, without imaging signs of diverticulitis. Vascular/Lymphatic: No ascites or pneumoperitoneum. No abdominal or pelvic lymphadenopathy, by size criteria. No aneurysmal dilation of the major abdominal arteries. There are mild peripheral atherosclerotic vascular calcifications of the aorta and its major branches. Reproductive: Mildly enlarged prostate gland. There are 3 fiducial markers. Symmetric bilateral seminal vesicles. Other: There is a small fat containing periumbilical hernia. There is small right inguinal hernia containing fat and small unobstructed distal small bowel loop. The soft tissues and abdominal wall are otherwise unremarkable. Musculoskeletal: No suspicious osseous lesions. There are mild multilevel degenerative changes in the visualized spine. Bilateral L4 spondylolysis noted with resultant grade 1/2 anterolisthesis of L4 over L5. IMPRESSION: 1. No acute inflammatory process identified within the abdomen or pelvis. 2. No metastatic disease  identified within the abdomen or pelvis. 3. Multiple other nonacute observations, as described above.  Aortic Atherosclerosis (ICD10-I70.0). Electronically Signed   By: Ree Molt M.D.   On: 09/27/2023 18:20   DG Chest 2 View Result Date: 09/27/2023 CLINICAL DATA:  Weakness. EXAM: CHEST - 2 VIEW COMPARISON:  08/30/2023 FINDINGS: Enlarged cardiac silhouette with improvement. Stable left subclavian bipolar pacemaker leads. Stable right subclavian porta catheter with its tip in the region of the confluence of the innominate veins. Clear lungs with normal vascularity. Unremarkable bones. IMPRESSION: 1. No acute abnormality. 2. Improved cardiomegaly. Electronically Signed   By: Elspeth Bathe M.D.   On: 09/27/2023 16:59   EKG: Independently reviewed.  Ventricular paced rhythm, rate 64, QTc 4519.  No significant change from prior.  Assessment/Plan Principal Problem:   AKI (acute kidney injury) (HCC) Active Problems:   NICM (nonischemic cardiomyopathy) (HCC)   Persistent atrial fibrillation (HCC)   ICD (implantable cardioverter-defibrillator) in place   Essential hypertension   DNR (do not resuscitate)   DM (diabetes mellitus), type 2 (HCC)  Assessment and Plan: * AKI (acute kidney injury) (HCC) Creatinine elevated 1.6, baseline 1-1.2.  Denies GI losses, hemoglobin stable at 10.4.  Likely from hypotension secondary to antihypertensives and diuretics.  He is also on ARB's - Hold Lasix  40 mg twice daily, and valsartanm metoprolol  - Will need to reduce/adjust his antihypertensives - 1 L bolus given with improvement in blood pressure, continue N/s 50cc/hr x 12 hrs  DM (diabetes mellitus), type 2 (HCC) - SSI- S - Hold home metformin  - HgbA1c  Essential hypertension Presented with hypotension, blood pressure down to 78/53.  - Hold metoprolol , valsartan , Lasix   Persistent atrial fibrillation (HCC) Rate controlled and on anticoagulation.  Digoxin  level 0.6. -Resume Xarelto  -Resume amiodarone   and digoxin .  NICM (nonischemic cardiomyopathy) (HCC) Stable and compensated.  Last echo 01/2023 EF 20%.  - Gentle hydration - Hold diuretics, will need to adjust doses of medications on discharge   DVT prophylaxis: Xarelto  Code Status: DNR-confirmed with patient and spouse at bedside Family Communication: Spouse at bedside  disposition Plan: ~ 2 days Consults called: None  Admission status: Obs tele    Author: Tully FORBES Carwin, MD 09/27/2023 12:56 AM  For on call review [email or web address].

## 2023-09-27 NOTE — ED Notes (Signed)
 Floor notified of patient on the way

## 2023-09-27 NOTE — ED Triage Notes (Addendum)
 Pt wears a monitor at home that is monitored by a cardiologist. Cardiologist reached out to pt and told him to come to the ED because his BP was low. Pt says he is having stomach pain, he is lightheaded, balance is off, and vision is a little blurry. Pt is A/Ox4, talking well with no slurring or confusion.

## 2023-09-27 NOTE — Plan of Care (Signed)
   Problem: Education: Goal: Knowledge of General Education information will improve Description: Including pain rating scale, medication(s)/side effects and non-pharmacologic comfort measures Outcome: Progressing   Problem: Activity: Goal: Risk for activity intolerance will decrease Outcome: Progressing   Problem: Nutrition: Goal: Adequate nutrition will be maintained Outcome: Progressing

## 2023-09-27 NOTE — Telephone Encounter (Signed)
 Pt c/o BP issue: STAT if pt c/o blurred vision, one-sided weakness or slurred speech.  STAT if BP is GREATER than 180/120 TODAY.  STAT if BP is LESS than 90/60 and SYMPTOMATIC TODAY  1. What is your BP concern? low  2. Have you taken any BP medication today?yes  3. What are your last 5 BP readings? 82/47 65, 79/48 69, 81/50 61. 73/44 65, 78/50 85  4. Are you having any other symptoms (ex. Dizziness, headache, blurred vision, passed out)? Dizziness blurred vision

## 2023-09-27 NOTE — ED Provider Notes (Signed)
 Ultrasound ED Echo  Date/Time: 09/27/2023 5:01 PM  Performed by: Franklyn Sid SAILOR, MD Authorized by: Franklyn Sid SAILOR, MD   Procedure details:    Indications: hypotension     Views: IVC view     Images: not archived     Limitations:  Acoustic shadowing Findings:    IVC: collapsed   Impression:    Impression: probable low CVP       Franklyn Sid SAILOR, MD 09/27/23 1702

## 2023-09-27 NOTE — Telephone Encounter (Signed)
 Spoke with Pt and women (partner?) via stat call from [facility] team. Pt is having blurred vision, stomach pains and dizziness. Pt was hard for this nurse to understand with slurred speech. Advised them for call 911 or go to the nearest ED. Pt stated understanding.

## 2023-09-27 NOTE — ED Triage Notes (Signed)
 Pt was at cardiologist and was told to come over here for hypotension. States it was 78/50. Denies CP and SHOB. C/O lightheadedness.

## 2023-09-28 DIAGNOSIS — Z85028 Personal history of other malignant neoplasm of stomach: Secondary | ICD-10-CM | POA: Diagnosis not present

## 2023-09-28 DIAGNOSIS — Z7901 Long term (current) use of anticoagulants: Secondary | ICD-10-CM | POA: Diagnosis not present

## 2023-09-28 DIAGNOSIS — I959 Hypotension, unspecified: Secondary | ICD-10-CM | POA: Diagnosis not present

## 2023-09-28 DIAGNOSIS — Z833 Family history of diabetes mellitus: Secondary | ICD-10-CM | POA: Diagnosis not present

## 2023-09-28 DIAGNOSIS — I10 Essential (primary) hypertension: Secondary | ICD-10-CM | POA: Diagnosis not present

## 2023-09-28 DIAGNOSIS — I2510 Atherosclerotic heart disease of native coronary artery without angina pectoris: Secondary | ICD-10-CM | POA: Diagnosis present

## 2023-09-28 DIAGNOSIS — Z66 Do not resuscitate: Secondary | ICD-10-CM | POA: Diagnosis present

## 2023-09-28 DIAGNOSIS — E871 Hypo-osmolality and hyponatremia: Secondary | ICD-10-CM | POA: Diagnosis present

## 2023-09-28 DIAGNOSIS — I428 Other cardiomyopathies: Secondary | ICD-10-CM | POA: Diagnosis present

## 2023-09-28 DIAGNOSIS — E1140 Type 2 diabetes mellitus with diabetic neuropathy, unspecified: Secondary | ICD-10-CM | POA: Diagnosis present

## 2023-09-28 DIAGNOSIS — E861 Hypovolemia: Secondary | ICD-10-CM | POA: Diagnosis present

## 2023-09-28 DIAGNOSIS — I4821 Permanent atrial fibrillation: Secondary | ICD-10-CM | POA: Diagnosis present

## 2023-09-28 DIAGNOSIS — Z95810 Presence of automatic (implantable) cardiac defibrillator: Secondary | ICD-10-CM | POA: Diagnosis not present

## 2023-09-28 DIAGNOSIS — I5042 Chronic combined systolic (congestive) and diastolic (congestive) heart failure: Secondary | ICD-10-CM | POA: Diagnosis present

## 2023-09-28 DIAGNOSIS — Z79899 Other long term (current) drug therapy: Secondary | ICD-10-CM | POA: Diagnosis not present

## 2023-09-28 DIAGNOSIS — E860 Dehydration: Secondary | ICD-10-CM | POA: Diagnosis present

## 2023-09-28 DIAGNOSIS — E1165 Type 2 diabetes mellitus with hyperglycemia: Secondary | ICD-10-CM | POA: Diagnosis present

## 2023-09-28 DIAGNOSIS — Z7984 Long term (current) use of oral hypoglycemic drugs: Secondary | ICD-10-CM | POA: Diagnosis not present

## 2023-09-28 DIAGNOSIS — Z96651 Presence of right artificial knee joint: Secondary | ICD-10-CM | POA: Diagnosis present

## 2023-09-28 DIAGNOSIS — N179 Acute kidney failure, unspecified: Secondary | ICD-10-CM | POA: Diagnosis present

## 2023-09-28 DIAGNOSIS — Z8546 Personal history of malignant neoplasm of prostate: Secondary | ICD-10-CM | POA: Diagnosis not present

## 2023-09-28 DIAGNOSIS — I110 Hypertensive heart disease with heart failure: Secondary | ICD-10-CM | POA: Diagnosis present

## 2023-09-28 DIAGNOSIS — Z794 Long term (current) use of insulin: Secondary | ICD-10-CM | POA: Diagnosis not present

## 2023-09-28 DIAGNOSIS — Z8249 Family history of ischemic heart disease and other diseases of the circulatory system: Secondary | ICD-10-CM | POA: Diagnosis not present

## 2023-09-28 DIAGNOSIS — I447 Left bundle-branch block, unspecified: Secondary | ICD-10-CM | POA: Diagnosis present

## 2023-09-28 LAB — BASIC METABOLIC PANEL WITH GFR
Anion gap: 7 (ref 5–15)
BUN: 28 mg/dL — ABNORMAL HIGH (ref 8–23)
CO2: 21 mmol/L — ABNORMAL LOW (ref 22–32)
Calcium: 8.5 mg/dL — ABNORMAL LOW (ref 8.9–10.3)
Chloride: 102 mmol/L (ref 98–111)
Creatinine, Ser: 1.16 mg/dL (ref 0.61–1.24)
GFR, Estimated: >60 mL/min (ref >60)
Glucose, Bld: 100 mg/dL — ABNORMAL HIGH (ref 70–99)
Potassium: 4.2 mmol/L (ref 3.5–5.1)
Sodium: 130 mmol/L — ABNORMAL LOW (ref 135–145)

## 2023-09-28 LAB — GLUCOSE, CAPILLARY
Glucose-Capillary: 96 mg/dL (ref 70–99)
Glucose-Capillary: 124 mg/dL — ABNORMAL HIGH (ref 70–99)
Glucose-Capillary: 130 mg/dL — ABNORMAL HIGH (ref 70–99)
Glucose-Capillary: 146 mg/dL — ABNORMAL HIGH (ref 70–99)

## 2023-09-28 LAB — CBC
HCT: 28.1 % — ABNORMAL LOW (ref 39.0–52.0)
Hemoglobin: 9.1 g/dL — ABNORMAL LOW (ref 13.0–17.0)
MCH: 29.9 pg (ref 26.0–34.0)
MCHC: 32.4 g/dL (ref 30.0–36.0)
MCV: 92.4 fL (ref 80.0–100.0)
Platelets: 339 K/uL (ref 150–400)
RBC: 3.04 MIL/uL — ABNORMAL LOW (ref 4.22–5.81)
RDW: 15.2 % (ref 11.5–15.5)
WBC: 7.9 K/uL (ref 4.0–10.5)
nRBC: 0 % (ref 0.0–0.2)

## 2023-09-28 LAB — HEMOGLOBIN A1C
Hgb A1c MFr Bld: 5.1 % (ref 4.8–5.6)
Mean Plasma Glucose: 99.67 mg/dL

## 2023-09-28 MED ORDER — SODIUM CHLORIDE 0.9 % IV BOLUS
250.0000 mL | Freq: Once | INTRAVENOUS | Status: AC
  Administered 2023-09-28: 250 mL via INTRAVENOUS

## 2023-09-28 MED ORDER — SODIUM CHLORIDE 0.9 % IV SOLN: INTRAVENOUS | Status: AC

## 2023-09-28 MED ORDER — SODIUM CHLORIDE 0.9 % IV SOLN: INTRAVENOUS | Status: DC

## 2023-09-28 NOTE — Plan of Care (Signed)

## 2023-09-28 NOTE — Assessment & Plan Note (Signed)
 Stable and compensated.  Last echo 01/2023 EF 20%.  - Gentle hydration - Hold diuretics, will need to adjust doses of medications on discharge

## 2023-09-28 NOTE — Assessment & Plan Note (Signed)
 Rate controlled and on anticoagulation.  Digoxin  level 0.6. -Resume Xarelto  -Resume amiodarone  and digoxin .

## 2023-09-28 NOTE — Assessment & Plan Note (Addendum)
 Creatinine elevated 1.6, baseline 1-1.2.  Denies GI losses, hemoglobin stable at 10.4.  Likely from hypotension secondary to antihypertensives and diuretics.  He is also on ARB's - Hold Lasix  40 mg twice daily, and valsartanm metoprolol  - Will need to reduce/adjust his antihypertensives - 1 L bolus given with improvement in blood pressure, continue N/s 50cc/hr x 12 hrs

## 2023-09-28 NOTE — Progress Notes (Signed)
 [name]    Progress Note  Jon Williams  FMW:[id number] DOB: 1950-12-16 DOA: 09/27/2023 PCP: [name], MD     Brief Narrative:   Jon Williams is an 73 y.o. male past medical history significant for systolic heart failure, diabetes mellitus type 2, chronic atrial fibrillation On Xarelto  and amiodarone  presents to the ED complaining of dizziness and blurry vision that started 5 days prior to admission felt dizzy like he was getting passed out, he checked his blood pressure at home over the last several days and has been having low readings despite this he continued to take metoprolol , valsartan  and Lasix  afebrile in the ED hypotensive he was given a liter of IV fluid CT scan of the abdomen pelvis without contrast showed no acute findings.  Assessment/Plan:   AKI (acute kidney injury) (HCC) Likely hemodynamically mediated in the setting of poor oral intake. With a baseline creatinine of 1. Antihypertensive medications were held. He was started on IV fluids.  Creatinine 1.6 on admission. Repeat a basic metabolic panel this morning.  Diabetes mellitus type 2: Hold metformin . Continue sliding scale insulin , A1c is pending.  Essential hypertension: Holding antihypertensive medication. In the setting of acute kidney injury.  Chronic atrial fibrillation: Continue digoxin  and amiodarone  and Xarelto . Continue to hold metoprolol  heart rate in the 60s.  Nonischemic cardiomyopathy: Stable compensated with an EF of 20%. Holding diuretics continue gentle diuresis.     DVT prophylaxis: Xarelto  Family Communication:none Status is: Observation The patient will require care spanning > 2 midnights and should be moved to inpatient because: Acute kidney injury with poor oral intake    Code Status:     Code Status Orders  (From admission, onward)           Start     Ordered   09/27/23 2241  Full code  Continuous       Question:  By:  Answer:   Consent: discussion documented in EHR   09/27/23 2242           Code Status History     Date Active Date Inactive Code Status Order ID Comments User Context   07/25/2023 0852 07/27/2023 0516 Full Code [id number]  Rolan Ezra RAMAN, MD Inpatient   10/29/2022 1708 10/31/2022 1754 Full Code [id number]  Lenetta Greig BIRCH, NP ED   12/25/2021 1825 12/27/2021 1646 Full Code [id number]  Jonel Lonni SQUIBB, MD Inpatient   09/13/2021 0920 09/15/2021 2057 DNR [id number]  Barbarann Nest, MD ED   04/01/2021 2153 04/06/2021 1801 DNR [id number]  Silvester Ales, MD Inpatient   11/30/2020 1521 12/02/2020 2252 Full Code [id number]  Laurita Cort DASEN, MD ED   10/10/2018 1134 10/11/2018 1417 Full Code [id number]  Waddell Danelle ORN, MD Inpatient   08/16/2017 1147 08/17/2017 1855 Full Code [id number]  Ottie Fonda RIGGERS Inpatient   07/17/2016 0856 07/17/2016 1538 Full Code [id number]  Waddell Danelle ORN, MD Inpatient   05/25/2016 1013 05/26/2016 1323 Full Code [id number]  Kathrine Jeoffrey POUR, RN Inpatient   05/25/2016 0947 05/25/2016 1013 Full Code [id number]  Waddell Danelle ORN, MD Inpatient   03/02/2016 1248 03/02/2016 2114 Full Code [id number]  End, Lonni, MD Inpatient      Advance Directive Documentation    Flowsheet Row Most Recent Value  Type of Advance Directive Living will  Pre-existing out of facility DNR order (yellow form or pink MOST form) --  MOST Form in Place? --      IV Access:   Peripheral IV  Procedures and diagnostic studies:   CT ABDOMEN PELVIS WO CONTRAST Result Date: 09/27/2023 CLINICAL DATA:  Abdominal pain, acute, nonlocalized. History of gastric cancer. * Tracking Code: BO * EXAM: CT ABDOMEN AND PELVIS WITHOUT CONTRAST TECHNIQUE: Multidetector CT imaging of the abdomen and pelvis was performed following the standard protocol without IV contrast. RADIATION DOSE REDUCTION: This exam was performed according to the departmental dose-optimization program which includes automated exposure control, adjustment of  the mA and/or kV according to patient size and/or use of iterative reconstruction technique. COMPARISON:  CT scan abdomen and pelvis from 08/30/2023. FINDINGS: Lower chest: The lung bases are clear. No pleural effusion. The heart is mildly enlarged in size. No pericardial effusion. Pacemaker leads noted. Hepatobiliary: The liver is normal in size. Non-cirrhotic configuration. No suspicious mass. There is a 8 x 9 mm hypoattenuating structure in the right hepatic lobe, segment 5, previously characterized as a simple cyst. No intrahepatic or extrahepatic bile duct dilation. Contracted gallbladder. There are dependent hyperattenuating areas, which may represent concentrated bile versus sludge. No calcified gallstones. No imaging signs of acute cholecystitis. Normal gallbladder wall thickness. No pericholecystic inflammatory changes. Pancreas: Unremarkable. No pancreatic ductal dilatation or surrounding inflammatory changes. Spleen: Within normal limits. No focal lesion. Adrenals/Urinary Tract: There is a stable 1.4 x 1.9 cm left adrenal adenoma. Diffuse thickening of right adrenal gland without focal nodule is also grossly similar to the prior study. No suspicious renal mass within the limitations of this unenhanced exam. No nephroureterolithiasis or obstructive uropathy. Unremarkable urinary bladder. Stomach/Bowel: There is a tiny sliding hiatal hernia. There is irregular thickening of the wall of distal stomach body/pylorus, not well evaluated on the current exam but grossly similar to the prior study and compatible with patient's known history of gastric cancer. No disproportionate dilation of the small or large bowel loops. No evidence of abnormal bowel wall thickening or inflammatory changes. The appendix is unremarkable. There are scattered diverticula mainly in the left hemi colon, without imaging signs of diverticulitis. Vascular/Lymphatic: No ascites or pneumoperitoneum. No abdominal or pelvic lymphadenopathy,  by size criteria. No aneurysmal dilation of the major abdominal arteries. There are mild peripheral atherosclerotic vascular calcifications of the aorta and its major branches. Reproductive: Mildly enlarged prostate gland. There are 3 fiducial markers. Symmetric bilateral seminal vesicles. Other: There is a small fat containing periumbilical hernia. There is small right inguinal hernia containing fat and small unobstructed distal small bowel loop. The soft tissues and abdominal wall are otherwise unremarkable. Musculoskeletal: No suspicious osseous lesions. There are mild multilevel degenerative changes in the visualized spine. Bilateral L4 spondylolysis noted with resultant grade 1/2 anterolisthesis of L4 over L5. IMPRESSION: 1. No acute inflammatory process identified within the abdomen or pelvis. 2. No metastatic disease identified within the abdomen or pelvis. 3. Multiple other nonacute observations, as described above. Aortic Atherosclerosis (ICD10-I70.0). Electronically Signed   By: Ree Molt M.D.   On: 09/27/2023 18:20   DG Chest 2 View Result Date: 09/27/2023 CLINICAL DATA:  Weakness. EXAM: CHEST - 2 VIEW COMPARISON:  08/30/2023 FINDINGS: Enlarged cardiac silhouette with improvement. Stable left subclavian bipolar pacemaker leads. Stable right subclavian porta catheter with its tip in the region of the confluence of the innominate veins. Clear lungs with normal vascularity. Unremarkable bones. IMPRESSION: 1. No acute abnormality. 2. Improved cardiomegaly. Electronically Signed   By: Elspeth Bathe M.D.   On: 09/27/2023 16:59     Medical Consultants:   None.   Subjective:    Jon Williams relates he  feels about the same.  Objective:    Vitals:   09/27/23 2131 09/27/23 2223 09/27/23 2306 09/28/23 0429  BP: 126/82 118/75  99/64  Pulse:  65  68  Resp: 17 17  16   Temp: (!) 97.4 F (36.3 C) 97.8 F (36.6 C)  97.8 F (36.6 C)  TempSrc: Oral Oral  Oral  SpO2: 94% 95%  100%   Weight:   70.4 kg   Height:   5' 6 (1.676 m)    SpO2: 100 % O2 Flow Rate (L/min): 2 L/min   Intake/Output Summary (Last 24 hours) at 09/28/2023 0630 Last data filed at 09/28/2023 0456 Gross per 24 hour  Intake 180 ml  Output 675 ml  Net -495 ml   Filed Weights   09/27/23 2306  Weight: 70.4 kg    Exam: General exam: In no acute distress. Respiratory system: Good air movement and clear to auscultation. Cardiovascular system: S1 & S2 heard, RRR. No JVD. Gastrointestinal system: Abdomen is nondistended, soft and nontender.  Extremities: No pedal edema. Skin: No rashes, lesions or ulcers Psychiatry: Judgement and insight appear normal. Mood & affect appropriate.    Data Reviewed:    Labs: Basic Metabolic Panel: Recent Labs  Lab 09/27/23 1451  NA 130*  K 4.8  CL 98  CO2 17*  GLUCOSE 145*  BUN 40*  CREATININE 1.63*  CALCIUM  9.0   GFR Estimated Creatinine Clearance: 35.9 mL/min (A) (by C-G formula based on SCr of 1.63 mg/dL (H)). Liver Function Tests: Recent Labs  Lab 09/27/23 1451  AST 26  ALT 14  ALKPHOS 42  BILITOT 0.6  PROT 6.8  ALBUMIN 3.6   No results for input(s): LIPASE, AMYLASE in the last 168 hours. No results for input(s): AMMONIA in the last 168 hours. Coagulation profile No results for input(s): INR, PROTIME in the last 168 hours. COVID-19 Labs  No results for input(s): DDIMER, FERRITIN, LDH, CRP in the last 72 hours.  Lab Results  Component Value Date   SARSCOV2NAA NEGATIVE 08/30/2023   SARSCOV2NAA NEGATIVE 12/25/2021   SARSCOV2NAA NEGATIVE 04/01/2021   SARSCOV2NAA NEGATIVE 11/30/2020    CBC: Recent Labs  Lab 09/27/23 1451  WBC 9.9  NEUTROABS 8.3*  HGB 10.4*  HCT 33.6*  MCV 95.7  PLT 390   Cardiac Enzymes: No results for input(s): CKTOTAL, CKMB, CKMBINDEX, TROPONINI in the last 168 hours. BNP (last 3 results) No results for input(s): PROBNP in the last 8760 hours. CBG: Recent Labs  Lab  09/27/23 1454 09/27/23 2237  GLUCAP 147* 91   D-Dimer: No results for input(s): DDIMER in the last 72 hours. Hgb A1c: No results for input(s): HGBA1C in the last 72 hours. Lipid Profile: No results for input(s): CHOL, HDL, LDLCALC, TRIG, CHOLHDL, LDLDIRECT in the last 72 hours. Thyroid  function studies: No results for input(s): TSH, T4TOTAL, T3FREE, THYROIDAB in the last 72 hours.  Invalid input(s): FREET3 Anemia work up: No results for input(s): VITAMINB12, FOLATE, FERRITIN, TIBC, IRON , RETICCTPCT in the last 72 hours. Sepsis Labs: Recent Labs  Lab 09/27/23 1451  WBC 9.9   Microbiology No results found for this or any previous visit (from the past 240 hours).   Medications:    amiodarone   200 mg Oral BID   Followed by   NOREEN ON 09/30/2023] amiodarone   200 mg Oral Daily   atorvastatin   40 mg Oral Daily   digoxin   0.0625 mg Oral Daily   insulin  aspart  0-5 Units Subcutaneous QHS   insulin  aspart  0-9 Units Subcutaneous TID WC   pantoprazole   40 mg Oral BID   rivaroxaban   20 mg Oral Q supper   Continuous Infusions:  sodium chloride  50 mL/hr at 09/28/23 0240      LOS: 0 days   Erle Odell Castor  [facility]  09/28/2023, 6:30 AM

## 2023-09-28 NOTE — Assessment & Plan Note (Signed)
 Presented with hypotension, blood pressure down to 78/53.  - Hold metoprolol , valsartan , Lasix 

## 2023-09-28 NOTE — Assessment & Plan Note (Addendum)
-   SSI- S - Hold home metformin  - HgbA1c

## 2023-09-29 DIAGNOSIS — N179 Acute kidney failure, unspecified: Secondary | ICD-10-CM | POA: Diagnosis not present

## 2023-09-29 LAB — BASIC METABOLIC PANEL WITH GFR
Anion gap: 10 (ref 5–15)
BUN: 18 mg/dL (ref 8–23)
CO2: 20 mmol/L — ABNORMAL LOW (ref 22–32)
Calcium: 8.3 mg/dL — ABNORMAL LOW (ref 8.9–10.3)
Chloride: 101 mmol/L (ref 98–111)
Creatinine, Ser: 0.98 mg/dL (ref 0.61–1.24)
GFR, Estimated: >60 mL/min (ref >60)
Glucose, Bld: 131 mg/dL — ABNORMAL HIGH (ref 70–99)
Potassium: 4.2 mmol/L (ref 3.5–5.1)
Sodium: 131 mmol/L — ABNORMAL LOW (ref 135–145)

## 2023-09-29 LAB — GLUCOSE, CAPILLARY
Glucose-Capillary: 125 mg/dL — ABNORMAL HIGH (ref 70–99)
Glucose-Capillary: 97 mg/dL (ref 70–99)
Glucose-Capillary: 113 mg/dL — ABNORMAL HIGH (ref 70–99)
Glucose-Capillary: 124 mg/dL — ABNORMAL HIGH (ref 70–99)

## 2023-09-29 MED ORDER — ALUM & MAG HYDROXIDE-SIMETH 200-200-20 MG/5ML PO SUSP
15.0000 mL | ORAL | Status: DC | PRN
  Administered 2023-09-29: 15 mL via ORAL
  Filled 2023-09-29: qty 30

## 2023-09-29 MED ORDER — METOPROLOL SUCCINATE ER 25 MG PO TB24
12.5000 mg | ORAL_TABLET | Freq: Every day | ORAL | Status: DC
  Administered 2023-09-30: 12.5 mg via ORAL
  Filled 2023-09-29: qty 1

## 2023-09-29 NOTE — Progress Notes (Signed)
 [name]    Progress Note  Jon Williams  FMW:[id number] DOB: 08/21/1950 DOA: 09/27/2023 PCP: [name], MD     Brief Narrative:   Jon Williams is an 73 y.o. male past medical history significant for systolic heart failure, diabetes mellitus type 2, chronic atrial fibrillation On Xarelto  and amiodarone  presents to the ED complaining of dizziness and blurry vision that started 5 days prior to admission felt dizzy like he was getting passed out, he checked his blood pressure at home over the last several days and has been having low readings despite this he continued to take metoprolol , valsartan  and Lasix  afebrile in the ED hypotensive he was given a liter of IV fluid CT scan of the abdomen pelvis without contrast showed no acute findings.  Assessment/Plan:   AKI (acute kidney injury) (HCC) Likely hemodynamically mediated in the setting of poor oral intake. With a baseline creatinine of 1. Antihypertensive medications were held. Creatinine 1.6 on admission was started on IV fluids his creatinine is improving. Basic metabolic panel is pending this morning  Diabetes mellitus type 2: Required minimal insulin , A1c of 5.1. Continue sliding scale insulin .  Essential hypertension: In the setting of acute kidney injury. Continue to hold antihypertensive medication blood pressure is borderline.  Chronic atrial fibrillation: Continue digoxin  and amiodarone  and Xarelto . Restart metoprolol .  Nonischemic cardiomyopathy: Stable compensated with an EF of 20%. Holding diuretics continue gentle diuresis.     DVT prophylaxis: Xarelto  Family Communication:none Status is: Observation The patient will require care spanning > 2 midnights and should be moved to inpatient because: Acute kidney injury with poor oral intake    Code Status:     Code Status Orders  (From admission, onward)           Start     Ordered   09/27/23 2241  Full code   Continuous       Question:  By:  Answer:  Consent: discussion documented in EHR   09/27/23 2242           Code Status History     Date Active Date Inactive Code Status Order ID Comments User Context   07/25/2023 0852 07/27/2023 0516 Full Code [id number]  Jon Ezra RAMAN, MD Inpatient   10/29/2022 1708 10/31/2022 1754 Full Code [id number]  Jon Greig BIRCH, NP ED   12/25/2021 1825 12/27/2021 1646 Full Code [id number]  Jon Jon Williams SQUIBB, MD Inpatient   09/13/2021 0920 09/15/2021 2057 DNR [id number]  Jon Nest, MD ED   04/01/2021 2153 04/06/2021 1801 DNR [id number]  Jon Ales, MD Inpatient   11/30/2020 1521 12/02/2020 2252 Full Code [id number]  Jon Cort DASEN, MD ED   10/10/2018 1134 10/11/2018 1417 Full Code [id number]  Jon Williams ORN, MD Inpatient   08/16/2017 1147 08/17/2017 1855 Full Code [id number]  Jon Williams Inpatient   07/17/2016 0856 07/17/2016 1538 Full Code [id number]  Jon Williams ORN, MD Inpatient   05/25/2016 1013 05/26/2016 1323 Full Code [id number]  Jon Jeoffrey POUR, RN Inpatient   05/25/2016 0947 05/25/2016 1013 Full Code [id number]  Jon Williams ORN, MD Inpatient   03/02/2016 1248 03/02/2016 2114 Full Code [id number]  Jon Williams, Lonni, MD Inpatient      Advance Directive Documentation    Flowsheet Row Most Recent Value  Type of Advance Directive Living will  Pre-existing out of facility DNR order (yellow form or pink MOST form) --  MOST Form in Place? --      IV Access:   Peripheral IV  Procedures and diagnostic studies:   CT ABDOMEN PELVIS WO CONTRAST Result Date: 09/27/2023 CLINICAL DATA:  Abdominal pain, acute, nonlocalized. History of gastric cancer. * Tracking Code: BO * EXAM: CT ABDOMEN AND PELVIS WITHOUT CONTRAST TECHNIQUE: Multidetector CT imaging of the abdomen and pelvis was performed following the standard protocol without IV contrast. RADIATION DOSE REDUCTION: This exam was performed according to the departmental dose-optimization program which includes  automated exposure control, adjustment of the mA and/or kV according to patient size and/or use of iterative reconstruction technique. COMPARISON:  CT scan abdomen and pelvis from 08/30/2023. FINDINGS: Lower chest: The lung bases are clear. No pleural effusion. The heart is mildly enlarged in size. No pericardial effusion. Pacemaker leads noted. Hepatobiliary: The liver is normal in size. Non-cirrhotic configuration. No suspicious mass. There is a 8 x 9 mm hypoattenuating structure in the right hepatic lobe, segment 5, previously characterized as a simple cyst. No intrahepatic or extrahepatic bile duct dilation. Contracted gallbladder. There are dependent hyperattenuating areas, which may represent concentrated bile versus sludge. No calcified gallstones. No imaging signs of acute cholecystitis. Normal gallbladder wall thickness. No pericholecystic inflammatory changes. Pancreas: Unremarkable. No pancreatic ductal dilatation or surrounding inflammatory changes. Spleen: Within normal limits. No focal lesion. Adrenals/Urinary Tract: There is a stable 1.4 x 1.9 cm left adrenal adenoma. Diffuse thickening of right adrenal gland without focal nodule is also grossly similar to the prior study. No suspicious renal mass within the limitations of this unenhanced exam. No nephroureterolithiasis or obstructive uropathy. Unremarkable urinary bladder. Stomach/Bowel: There is a tiny sliding hiatal hernia. There is irregular thickening of the wall of distal stomach body/pylorus, not well evaluated on the current exam but grossly similar to the prior study and compatible with patient's known history of gastric cancer. No disproportionate dilation of the small or large bowel loops. No evidence of abnormal bowel wall thickening or inflammatory changes. The appendix is unremarkable. There are scattered diverticula mainly in the left hemi colon, without imaging signs of diverticulitis. Vascular/Lymphatic: No ascites or  pneumoperitoneum. No abdominal or pelvic lymphadenopathy, by size criteria. No aneurysmal dilation of the Jon abdominal arteries. There are mild peripheral atherosclerotic vascular calcifications of the aorta and its Jon branches. Reproductive: Mildly enlarged prostate gland. There are 3 fiducial markers. Symmetric bilateral seminal vesicles. Other: There is a small fat containing periumbilical hernia. There is small right inguinal hernia containing fat and small unobstructed distal small bowel loop. The soft tissues and abdominal wall are otherwise unremarkable. Musculoskeletal: No suspicious osseous lesions. There are mild multilevel degenerative changes in the visualized spine. Bilateral L4 spondylolysis noted with resultant grade 1/2 anterolisthesis of L4 over L5. IMPRESSION: 1. No acute inflammatory process identified within the abdomen or pelvis. 2. No metastatic disease identified within the abdomen or pelvis. 3. Multiple other nonacute observations, as described above. Aortic Atherosclerosis (ICD10-I70.0). Electronically Signed   By: Ree Molt M.D.   On: 09/27/2023 18:20   DG Chest 2 View Result Date: 09/27/2023 CLINICAL DATA:  Weakness. EXAM: CHEST - 2 VIEW COMPARISON:  08/30/2023 FINDINGS: Enlarged cardiac silhouette with improvement. Stable left subclavian bipolar pacemaker leads. Stable right subclavian porta catheter with its tip in the region of the confluence of the innominate veins. Clear lungs with normal vascularity. Unremarkable bones. IMPRESSION: 1. No acute abnormality. 2. Improved cardiomegaly. Electronically Signed   By: Elspeth Bathe M.D.   On: 09/27/2023 16:59     Medical Consultants:   None.   Subjective:    Jon Williams is better  this morning tolerate his diet.  Objective:    Vitals:   09/28/23 1008 09/28/23 1630 09/28/23 2018 09/29/23 0515  BP: 100/62 106/66 123/80 98/69  Pulse: 64 61 69 83  Resp:  18 19 15   Temp:  98.4 F (36.9 C) 98.5 F (36.9 C)  98.4 F (36.9 C)  TempSrc:   Oral Oral  SpO2:  100% 98% 100%  Weight:      Height:       SpO2: 100 % O2 Flow Rate (L/min): 2 L/min   Intake/Output Summary (Last 24 hours) at 09/29/2023 0803 Last data filed at 09/28/2023 2200 Gross per 24 hour  Intake 420.07 ml  Output 1050 ml  Net -629.93 ml   Filed Weights   09/27/23 2306  Weight: 70.4 kg    Exam: General exam: In no acute distress. Respiratory system: Good air movement and clear to auscultation. Cardiovascular system: S1 & S2 heard, RRR. No JVD. Gastrointestinal system: Abdomen is nondistended, soft and nontender.  Extremities: No pedal edema. Skin: No rashes, lesions or ulcers Psychiatry: Judgement and insight appear normal. Mood & affect appropriate. Data Reviewed:    Labs: Basic Metabolic Panel: Recent Labs  Lab 09/27/23 1451 09/28/23 0619  NA 130* 130*  K 4.8 4.2  CL 98 102  CO2 17* 21*  GLUCOSE 145* 100*  BUN 40* 28*  CREATININE 1.63* 1.16  CALCIUM  9.0 8.5*   GFR Estimated Creatinine Clearance: 50.4 mL/min (by C-G formula based on SCr of 1.16 mg/dL). Liver Function Tests: Recent Labs  Lab 09/27/23 1451  AST 26  ALT 14  ALKPHOS 42  BILITOT 0.6  PROT 6.8  ALBUMIN 3.6   No results for input(s): LIPASE, AMYLASE in the last 168 hours. No results for input(s): AMMONIA in the last 168 hours. Coagulation profile No results for input(s): INR, PROTIME in the last 168 hours. COVID-19 Labs  No results for input(s): DDIMER, FERRITIN, LDH, CRP in the last 72 hours.  Lab Results  Component Value Date   SARSCOV2NAA NEGATIVE 08/30/2023   SARSCOV2NAA NEGATIVE 12/25/2021   SARSCOV2NAA NEGATIVE 04/01/2021   SARSCOV2NAA NEGATIVE 11/30/2020    CBC: Recent Labs  Lab 09/27/23 1451 09/28/23 0619  WBC 9.9 7.9  NEUTROABS 8.3*  --   HGB 10.4* 9.1*  HCT 33.6* 28.1*  MCV 95.7 92.4  PLT 390 339   Cardiac Enzymes: No results for input(s): CKTOTAL, CKMB, CKMBINDEX, TROPONINI in  the last 168 hours. BNP (last 3 results) No results for input(s): PROBNP in the last 8760 hours. CBG: Recent Labs  Lab 09/28/23 0728 09/28/23 1123 09/28/23 1628 09/28/23 2009 09/29/23 0746  GLUCAP 96 124* 130* 146* 125*   D-Dimer: No results for input(s): DDIMER in the last 72 hours. Hgb A1c: Recent Labs    09/28/23 0619  HGBA1C 5.1   Lipid Profile: No results for input(s): CHOL, HDL, LDLCALC, TRIG, CHOLHDL, LDLDIRECT in the last 72 hours. Thyroid  function studies: No results for input(s): TSH, T4TOTAL, T3FREE, THYROIDAB in the last 72 hours.  Invalid input(s): FREET3 Anemia work up: No results for input(s): VITAMINB12, FOLATE, FERRITIN, TIBC, IRON , RETICCTPCT in the last 72 hours. Sepsis Labs: Recent Labs  Lab 09/27/23 1451 09/28/23 0619  WBC 9.9 7.9   Microbiology No results found for this or any previous visit (from the past 240 hours).   Medications:    amiodarone   200 mg Oral BID   Followed by   NOREEN ON 09/30/2023] amiodarone   200 mg Oral Daily   atorvastatin   40  mg Oral Daily   digoxin   0.0625 mg Oral Daily   insulin  aspart  0-5 Units Subcutaneous QHS   insulin  aspart  0-9 Units Subcutaneous TID WC   pantoprazole   40 mg Oral BID   rivaroxaban   20 mg Oral Q supper   Continuous Infusions:      LOS: 1 day   Erle Odell Castor  [facility]  09/29/2023, 8:03 AM

## 2023-09-29 NOTE — Plan of Care (Signed)

## 2023-09-30 ENCOUNTER — Other Ambulatory Visit (HOSPITAL_COMMUNITY): Payer: Self-pay

## 2023-09-30 DIAGNOSIS — I10 Essential (primary) hypertension: Secondary | ICD-10-CM | POA: Diagnosis not present

## 2023-09-30 DIAGNOSIS — I428 Other cardiomyopathies: Secondary | ICD-10-CM | POA: Diagnosis not present

## 2023-09-30 DIAGNOSIS — I959 Hypotension, unspecified: Secondary | ICD-10-CM | POA: Diagnosis not present

## 2023-09-30 DIAGNOSIS — N179 Acute kidney failure, unspecified: Secondary | ICD-10-CM | POA: Diagnosis not present

## 2023-09-30 LAB — GLUCOSE, CAPILLARY: Glucose-Capillary: 141 mg/dL — ABNORMAL HIGH (ref 70–99)

## 2023-09-30 MED ORDER — ORAL CARE MOUTH RINSE: 15.0000 mL | OROMUCOSAL | Status: DC | PRN

## 2023-09-30 MED ORDER — PANTOPRAZOLE SODIUM 40 MG PO TBEC
40.0000 mg | DELAYED_RELEASE_TABLET | Freq: Two times a day (BID) | ORAL | 3 refills | Status: AC
  Filled 2023-09-30: qty 180, 90d supply, fill #0

## 2023-09-30 MED ORDER — VALSARTAN 40 MG PO TABS: 40.0000 mg | ORAL_TABLET | Freq: Two times a day (BID) | ORAL | Status: DC

## 2023-09-30 MED ORDER — PANTOPRAZOLE SODIUM 40 MG PO TBEC
40.0000 mg | DELAYED_RELEASE_TABLET | Freq: Every day | ORAL | 1 refills | Status: AC
  Filled 2023-09-30 – 2023-12-19 (×8): qty 30, 30d supply, fill #0
  Filled 2024-01-15: qty 30, 30d supply, fill #1

## 2023-09-30 NOTE — Discharge Summary (Addendum)
 Physician Discharge Summary  Jon Williams FMW:[id number] DOB: Dec 12, 1950 DOA: 09/27/2023  PCP: [name], MD  Admit date: 09/27/2023 Discharge date: 09/30/2023  Admitted From: Home Disposition:  Home  Recommendations for Outpatient Follow-up:  Follow up with orthopedic surgery in 1-2 weeks Please obtain BMP/CBC in one week   Home Health:No Equipment/Devices:None  Discharge Condition:Stable CODE STATUS:Full Diet recommendation: Heart Healthy   Brief/Interim Summary: 73 y.o. male past medical history significant for systolic heart failure, diabetes mellitus type 2, gastric cancer stage II diagnosed in 2022 with good response to immunotherapy, malignant neoplasm of the prostate in early 2019 chronic atrial fibrillation On Xarelto  and amiodarone  presents to the ED complaining of dizziness and blurry vision that started 5 days prior to admission felt dizzy like he was getting passed out, he checked his blood pressure at home over the last several days and has been having low readings despite this he continued to take metoprolol , valsartan  and Lasix  afebrile in the ED hypotensive he was given a liter of IV fluid CT scan of the abdomen pelvis without contrast showed no acute findings.   Discharge Diagnoses:  Principal Problem:   AKI (acute kidney injury) (HCC) Active Problems:   NICM (nonischemic cardiomyopathy) (HCC)   Persistent atrial fibrillation (HCC)   ICD (implantable cardioverter-defibrillator) in place   Essential hypertension   DNR (do not resuscitate)   DM (diabetes mellitus), type 2 (HCC)  Acute kidney injury: Likely hemodynamic mediated in the setting of poor oral intake. With a baseline creatinine of 1 antihypertensive medications were held on admission. On admission creatinine was 1.6 and improved with IV fluids his creatinine is back to baseline.  Diabetes mellitus type 2 with hyperglycemia: No change made to his medication.  Essential hypertension: In the  setting of acute kidney injury antihypertensive medications were held.  Peripheral neuropathy: PT OT evaluated the patient will go home with PT.  Chronic A-fib: Continue digoxin  metoprolol  amiodarone  and Xarelto .  Nonischemic cardiomyopathy: With a last EF of 20% appears to be compensated. Diuretics were held on admission and he will resume as an outpatient. Resume ACE inhibitor in 1 week.  History of gastric cancer: Complaining of some stomach upset, which is improving he was able to tolerate his diet.  He is scheduled to see Dr. [name] for an endoscopy on 10/04/2023.  Hypovolemic hyponatremia: Result with IV fluids.  Discharge Instructions  Discharge Instructions     Diet - low sodium heart healthy   Complete by: As directed    Increase activity slowly   Complete by: As directed       Allergies as of 09/30/2023       Reactions   Aspirin  Anaphylaxis, Hives   Sacubitril -valsartan  Swelling   lip swelling, angioedema   Sulfa Antibiotics Anaphylaxis, Hives, Swelling, Other (See Comments)   Swollen lips   Ace Inhibitors    Possible angioedema        Medication List     STOP taking these medications    famotidine  40 MG tablet Commonly known as: PEPCID    HYDROcodone -acetaminophen  5-325 MG tablet Commonly known as: NORCO/VICODIN   ketoconazole  2 % cream Commonly known as: NIZORAL    sucralfate  1 g tablet Commonly known as: Carafate        TAKE these medications    Accu-Chek Guide Test test strip Generic drug: glucose blood Use to check blood sugars once daily.   Accu-Chek Guide w/Device Kit as directed to check blood sugar once a day   Accu-Chek Softclix Lancets lancets  Use to check your blood sugar finger stick once a day Dx Code E11.9 90 days   amiodarone  200 MG tablet Commonly known as: Pacerone  Take 1 tablet (200 mg total) by mouth 2 (two) times daily for 14 days, THEN 1 tablet (200 mg total) daily. Start taking on: September 16, 2023   atorvastatin   40 MG tablet Commonly known as: LIPITOR Take 1 tablet (40 mg total) by mouth daily.   Biotin 1000 MCG tablet Take 1,000 mcg by mouth daily with breakfast.   cetirizine  10 MG tablet Commonly known as: ZYRTEC  Take 1 tablet (10 mg total) by mouth daily.   cholecalciferol  25 MCG (1000 UNIT) tablet Commonly known as: VITAMIN D3 Take 1,000 Units by mouth daily with breakfast.   clotrimazole-betamethasone cream Commonly known as: LOTRISONE Apply 1 Application topically 2 (two) times daily.   digoxin  0.125 MG tablet Commonly known as: LANOXIN  Take 1/2 tablet (0.0625mg  total) by mouth daily.   EPINEPHrine  0.3 mg/0.3 mL Soaj injection Commonly known as: EPI-PEN Inject 0.3 mg into the muscle as needed for anaphylaxis.   ferrous sulfate 325 (65 FE) MG tablet Take 325 mg by mouth daily with breakfast.   furosemide  40 MG tablet Commonly known as: LASIX  Take 1 tablet (40 mg total) by mouth daily.   gabapentin  100 MG capsule Commonly known as: NEURONTIN  Take 1 capsule (100 mg total) by mouth 2 (two) times daily.   ipratropium 0.03 % nasal spray Commonly known as: ATROVENT  Place 1-2 sprays into both nostrils 2 (two) times daily as needed (nasal drainage).   Jardiance  10 MG Tabs tablet Generic drug: empagliflozin  Take 1 tablet (10 mg total) by mouth daily.   lidocaine -prilocaine  cream Commonly known as: EMLA  Apply 1 Application topically as needed.   metFORMIN  500 MG 24 hr tablet Commonly known as: GLUCOPHAGE -XR Take 1 tablet (500 mg total) by mouth daily with evening meal.   metoprolol  succinate 25 MG 24 hr tablet Commonly known as: TOPROL -XL Take 1 tablet (25 mg total) by mouth daily.   multivitamin Tabs tablet Take 1 tablet by mouth daily with breakfast.   pantoprazole  40 MG tablet Commonly known as: Protonix  Take 1 tablet (40 mg total) by mouth daily. What changed: You were already taking a medication with the same name, and this prescription was added. Make sure you  understand how and when to take each.   pantoprazole  40 MG tablet Commonly known as: PROTONIX  Take 1 tablet (40 mg total) by mouth 2 (two) times daily. What changed: Another medication with the same name was added. Make sure you understand how and when to take each.   potassium chloride  10 MEQ tablet Commonly known as: KLOR-CON  Take 2 tablets (20 mEq total) by mouth daily. What changed:  how much to take when to take this   spironolactone  25 MG tablet Commonly known as: ALDACTONE  Take 1 tablet (25 mg total) by mouth daily.   valsartan  40 MG tablet Commonly known as: Diovan  Take 1 tablet (40 mg total) by mouth 2 (two) times daily. Start taking on: October 08, 2023 What changed: These instructions start on October 08, 2023. If you are unsure what to do until then, ask your doctor or other care provider.   Xarelto  20 MG Tabs tablet Generic drug: rivaroxaban  Take 1 tablet (20 mg total) by mouth daily with supper.        Allergies  Allergen Reactions   Aspirin  Anaphylaxis and Hives   Sacubitril -Valsartan  Swelling    lip swelling, angioedema  Sulfa Antibiotics Anaphylaxis, Hives, Swelling and Other (See Comments)    Swollen lips   Ace Inhibitors     Possible angioedema    Consultations: None   Procedures/Studies: CT ABDOMEN PELVIS WO CONTRAST Result Date: 09/27/2023 CLINICAL DATA:  Abdominal pain, acute, nonlocalized. History of gastric cancer. * Tracking Code: BO * EXAM: CT ABDOMEN AND PELVIS WITHOUT CONTRAST TECHNIQUE: Multidetector CT imaging of the abdomen and pelvis was performed following the standard protocol without IV contrast. RADIATION DOSE REDUCTION: This exam was performed according to the departmental dose-optimization program which includes automated exposure control, adjustment of the mA and/or kV according to patient size and/or use of iterative reconstruction technique. COMPARISON:  CT scan abdomen and pelvis from 08/30/2023. FINDINGS: Lower chest: The lung  bases are clear. No pleural effusion. The heart is mildly enlarged in size. No pericardial effusion. Pacemaker leads noted. Hepatobiliary: The liver is normal in size. Non-cirrhotic configuration. No suspicious mass. There is a 8 x 9 mm hypoattenuating structure in the right hepatic lobe, segment 5, previously characterized as a simple cyst. No intrahepatic or extrahepatic bile duct dilation. Contracted gallbladder. There are dependent hyperattenuating areas, which may represent concentrated bile versus sludge. No calcified gallstones. No imaging signs of acute cholecystitis. Normal gallbladder wall thickness. No pericholecystic inflammatory changes. Pancreas: Unremarkable. No pancreatic ductal dilatation or surrounding inflammatory changes. Spleen: Within normal limits. No focal lesion. Adrenals/Urinary Tract: There is a stable 1.4 x 1.9 cm left adrenal adenoma. Diffuse thickening of right adrenal gland without focal nodule is also grossly similar to the prior study. No suspicious renal mass within the limitations of this unenhanced exam. No nephroureterolithiasis or obstructive uropathy. Unremarkable urinary bladder. Stomach/Bowel: There is a tiny sliding hiatal hernia. There is irregular thickening of the wall of distal stomach body/pylorus, not well evaluated on the current exam but grossly similar to the prior study and compatible with patient's known history of gastric cancer. No disproportionate dilation of the small or large bowel loops. No evidence of abnormal bowel wall thickening or inflammatory changes. The appendix is unremarkable. There are scattered diverticula mainly in the left hemi colon, without imaging signs of diverticulitis. Vascular/Lymphatic: No ascites or pneumoperitoneum. No abdominal or pelvic lymphadenopathy, by size criteria. No aneurysmal dilation of the major abdominal arteries. There are mild peripheral atherosclerotic vascular calcifications of the aorta and its major branches.  Reproductive: Mildly enlarged prostate gland. There are 3 fiducial markers. Symmetric bilateral seminal vesicles. Other: There is a small fat containing periumbilical hernia. There is small right inguinal hernia containing fat and small unobstructed distal small bowel loop. The soft tissues and abdominal wall are otherwise unremarkable. Musculoskeletal: No suspicious osseous lesions. There are mild multilevel degenerative changes in the visualized spine. Bilateral L4 spondylolysis noted with resultant grade 1/2 anterolisthesis of L4 over L5. IMPRESSION: 1. No acute inflammatory process identified within the abdomen or pelvis. 2. No metastatic disease identified within the abdomen or pelvis. 3. Multiple other nonacute observations, as described above. Aortic Atherosclerosis (ICD10-I70.0). Electronically Signed   By: Ree Molt M.D.   On: 09/27/2023 18:20   DG Chest 2 View Result Date: 09/27/2023 CLINICAL DATA:  Weakness. EXAM: CHEST - 2 VIEW COMPARISON:  08/30/2023 FINDINGS: Enlarged cardiac silhouette with improvement. Stable left subclavian bipolar pacemaker leads. Stable right subclavian porta catheter with its tip in the region of the confluence of the innominate veins. Clear lungs with normal vascularity. Unremarkable bones. IMPRESSION: 1. No acute abnormality. 2. Improved cardiomegaly. Electronically Signed   By: Elspeth Bathe  M.D.   On: 09/27/2023 16:59   DG Abd 1 View Result Date: 09/01/2023 CLINICAL DATA:  Constipation. EXAM: ABDOMEN - 1 VIEW COMPARISON:  CT 2 days ago. FINDINGS: Moderate colonic stool burden, similar to recent CT. Small to moderate stool distends the rectum, 5.6 cm. No bowel dilatation or evidence of obstruction. No visible radiopaque calculi. Possible urolith clips in the pelvis. IMPRESSION: Moderate colonic stool burden, similar to recent CT. Small to moderate stool distends the rectum. No bowel obstruction. Electronically Signed   By: Andrea Gasman M.D.   On: 09/01/2023 15:17     Subjective: No complaints  Discharge Exam: Vitals:   09/29/23 2133 09/30/23 0447  BP: (!) 123/95 111/65  Pulse: 67 68  Resp: 18 18  Temp: 98.8 F (37.1 C) 98.1 F (36.7 C)  SpO2: 100% 98%   Vitals:   09/29/23 0836 09/29/23 1558 09/29/23 2133 09/30/23 0447  BP: 98/63 119/69 (!) 123/95 111/65  Pulse: 65 64 67 68  Resp:  18 18 18   Temp: 97.7 F (36.5 C) 98.2 F (36.8 C) 98.8 F (37.1 C) 98.1 F (36.7 C)  TempSrc: Oral   Oral  SpO2: 100% 100% 100% 98%  Weight:      Height:        General: Pt is alert, awake, not in acute distress Cardiovascular: RRR, S1/S2 +, no rubs, no gallops Respiratory: CTA bilaterally, no wheezing, no rhonchi Abdominal: Soft, NT, ND, bowel sounds + Extremities: no edema, no cyanosis    The results of significant diagnostics from this hospitalization (including imaging, microbiology, ancillary and laboratory) are listed below for reference.     Microbiology: No results found for this or any previous visit (from the past 240 hours).   Labs: BNP (last 3 results) Recent Labs    08/23/23 1418 09/13/23 1111 09/27/23 1809  BNP 345.6* 397.6* 176.4*   Basic Metabolic Panel: Recent Labs  Lab 09/27/23 1451 09/28/23 0619 09/29/23 0949  NA 130* 130* 131*  K 4.8 4.2 4.2  CL 98 102 101  CO2 17* 21* 20*  GLUCOSE 145* 100* 131*  BUN 40* 28* 18  CREATININE 1.63* 1.16 0.98  CALCIUM  9.0 8.5* 8.3*   Liver Function Tests: Recent Labs  Lab 09/27/23 1451  AST 26  ALT 14  ALKPHOS 42  BILITOT 0.6  PROT 6.8  ALBUMIN 3.6   No results for input(s): LIPASE, AMYLASE in the last 168 hours. No results for input(s): AMMONIA in the last 168 hours. CBC: Recent Labs  Lab 09/27/23 1451 09/28/23 0619  WBC 9.9 7.9  NEUTROABS 8.3*  --   HGB 10.4* 9.1*  HCT 33.6* 28.1*  MCV 95.7 92.4  PLT 390 339   Cardiac Enzymes: No results for input(s): CKTOTAL, CKMB, CKMBINDEX, TROPONINI in the last 168 hours. BNP: Invalid input(s):  POCBNP CBG: Recent Labs  Lab 09/29/23 0746 09/29/23 1154 09/29/23 1642 09/29/23 2213 09/30/23 0752  GLUCAP 125* 97 113* 124* 141*   D-Dimer No results for input(s): DDIMER in the last 72 hours. Hgb A1c Recent Labs    09/28/23 0619  HGBA1C 5.1   Lipid Profile No results for input(s): CHOL, HDL, LDLCALC, TRIG, CHOLHDL, LDLDIRECT in the last 72 hours. Thyroid  function studies No results for input(s): TSH, T4TOTAL, T3FREE, THYROIDAB in the last 72 hours.  Invalid input(s): FREET3 Anemia work up No results for input(s): VITAMINB12, FOLATE, FERRITIN, TIBC, IRON , RETICCTPCT in the last 72 hours. Urinalysis    Component Value Date/Time   COLORURINE STRAW (A) 09/27/2023  1913   APPEARANCEUR CLEAR 09/27/2023 1913   LABSPEC 1.008 09/27/2023 1913   PHURINE 5.0 09/27/2023 1913   GLUCOSEU >=500 (A) 09/27/2023 1913   HGBUR NEGATIVE 09/27/2023 1913   BILIRUBINUR NEGATIVE 09/27/2023 1913   KETONESUR NEGATIVE 09/27/2023 1913   PROTEINUR NEGATIVE 09/27/2023 1913   NITRITE NEGATIVE 09/27/2023 1913   LEUKOCYTESUR NEGATIVE 09/27/2023 1913   Sepsis Labs Recent Labs  Lab 09/27/23 1451 09/28/23 0619  WBC 9.9 7.9   Microbiology No results found for this or any previous visit (from the past 240 hours).   Time coordinating discharge: Over 35 minutes  SIGNED:   Erle Odell Castor, MD  [facility] 09/30/2023, 8:07 AM Pager   If 7PM-7AM, please contact night-coverage [email or web address] Password [email or web address]

## 2023-09-30 NOTE — Progress Notes (Signed)
 Physical Therapy Evaluation and Discharge  Patient Details Name: Jon Williams MRN: [id number] DOB: 06-02-1950 Today's Date: 09/30/2023  History of Present Illness  Patient is a 73 year old male found to have AKI after presenting with dizziness and blurred vision. PMH: systolic heart failure, DM2, chronic atrial fibrillation.  Clinical Impression  Patient is agreeable to PT evaluation. Supportive spouse at bedside. Patient uses a rollator at home for ambulation. Spouse reports general declined in mobility since completing physical therapy several months ago.  Today the patient required CGA for standing. He was able to walk in the hallway without loss of balance using the rolling walker. Encouraged patient to continue using assistive device for safety at home and progressing activity slowly. Vitals stable after activity. Anticipate patient could benefit from home health PT after this hospital stay. No further acute PT needs as patient anticipated to discharge today. Will sign off at this time.       If plan is discharge home, recommend the following: Assist for transportation;Assistance with cooking/housework;A little help with bathing/dressing/bathroom;Help with stairs or ramp for entrance   Can travel by private vehicle        Equipment Recommendations None recommended by PT  Recommendations for Other Services       Functional Status Assessment Patient has had a recent decline in their functional status and demonstrates the ability to make significant improvements in function in a reasonable and predictable amount of time.     Precautions / Restrictions Precautions Precautions: Fall Recall of Precautions/Restrictions: Intact Restrictions Weight Bearing Restrictions Per Provider Order: No      Mobility  Bed Mobility               General bed mobility comments: not assessed as patient sitting up on arrival and post session    Transfers Overall transfer level: Needs  assistance Equipment used: Rolling walker (2 wheels) Transfers: Sit to/from Stand Sit to Stand: Contact guard assist           General transfer comment: cues for hand placement for safety    Ambulation/Gait Ambulation/Gait assistance: Contact guard assist, Supervision Gait Distance (Feet): 100 Feet Assistive device: Rolling walker (2 wheels) Gait Pattern/deviations: Step-through pattern, Decreased stride length Gait velocity: decreased     General Gait Details: no shortness of breath with ambulation. encouraged short distance ambulation for safety and fall prevention  Stairs            Wheelchair Mobility     Tilt Bed    Modified Rankin (Stroke Patients Only)       Balance Overall balance assessment: Needs assistance Sitting-balance support: Feet supported Sitting balance-Leahy Scale: Good     Standing balance support: Single extremity supported Standing balance-Leahy Scale: Fair Standing balance comment: at least unilateral UE support required for standing                             Pertinent Vitals/Pain Pain Assessment Pain Assessment: No/denies pain    Home Living Family/patient expects to be discharged to:: Private residence Living Arrangements: Spouse/significant other Available Help at Discharge: Family;Available 24 hours/day Type of Home: House Home Access: Stairs to enter   [employer] of Steps: 4     Home Equipment: Rollator (4 wheels)      Prior Function Prior Level of Function : Independent/Modified Independent;Needs assist             Mobility Comments: Mod I with rollator. Pushes  a cart at the store. spouse reports general decline in activity since completing physical therapy ADLs Comments: spouse assist as needed     Extremity/Trunk Assessment   Upper Extremity Assessment Upper Extremity Assessment: Overall WFL for tasks assessed    Lower Extremity Assessment Lower Extremity Assessment:  Generalized weakness       Communication   Communication Communication: No apparent difficulties    Cognition Arousal: Alert Behavior During Therapy: WFL for tasks assessed/performed   PT - Cognitive impairments: No apparent impairments                         Following commands: Intact       Cueing Cueing Techniques: Verbal cues     General Comments General comments (skin integrity, edema, etc.): vitals stable after mobility. no dizziness reported with activity    Exercises     Assessment/Plan    PT Assessment All further PT needs can be met in the next venue of care  PT Problem List Decreased strength;Decreased activity tolerance;Decreased balance;Decreased mobility       PT Treatment Interventions      PT Goals (Current goals can be found in the Care Plan section)  Acute Rehab PT Goals Patient Stated Goal: home PT Goal Formulation: All assessment and education complete, DC therapy    Frequency       Co-evaluation               AM-PAC PT 6 Clicks Mobility  Outcome Measure Help needed turning from your back to your side while in a flat bed without using bedrails?: None Help needed moving from lying on your back to sitting on the side of a flat bed without using bedrails?: A Little Help needed moving to and from a bed to a chair (including a wheelchair)?: A Little Help needed standing up from a chair using your arms (e.g., wheelchair or bedside chair)?: A Little Help needed to walk in hospital room?: A Little Help needed climbing 3-5 steps with a railing? : A Little 6 Click Score: 19    End of Session Equipment Utilized During Treatment: Gait belt Activity Tolerance: Patient tolerated treatment well Patient left:  (seated on bed, family present) Nurse Communication: Mobility status PT Visit Diagnosis: Muscle weakness (generalized) (M62.81);Unsteadiness on feet (R26.81)    Time: 9166-9148 PT Time Calculation (min) (ACUTE ONLY): 18  min   Charges:   PT Evaluation $PT Eval Low Complexity: 1 Low   PT General Charges $$ ACUTE PT VISIT: 1 Visit         Randine Essex, PT, MPT   Randine LULLA Essex 09/30/2023, 9:00 AM

## 2023-09-30 NOTE — TOC Transition Note (Signed)
 Transition of Care [facility]) - Discharge Note   Patient Details  Name: Jon Williams MRN: [id number] Date of Birth: [birth year]  [facility] CM/SW Contact:  Tom-Johnson, Harvest Muskrat, RN Phone Number: 09/30/2023, 9:27 AM   Clinical Narrative:     Patient is scheduled for discharge today.  Readmission Risk Assessment done. Home health inOutpatient f/u, hospital f/u and discharge instructions on AVS. Prescriptions sent to [facility] and patient will pickup meds at discharge. Wife, Carollee to transport at discharge.  No further TOC needs noted.       Final next level of care: Home w Home Health Services Barriers to Discharge: Barriers Resolved   Patient Goals and CMS Choice Patient states their goals for this hospitalization and ongoing recovery are:: To return home CMS Medicare.gov Compare Post Acute Care list provided to:: Patient Choice offered to / list presented to : Patient, Spouse      Discharge Placement                Patient to be transferred to facility by: Wife Name of family member notified: Marilene    Discharge Plan and Services Additional resources added to the After Visit Summary for                  DME Arranged: N/A DME Agency: NA       HH Arranged: NA HH Agency: NA        Social Drivers of Health (SDOH) Interventions SDOH Screenings   Food Insecurity: No Food Insecurity (09/27/2023)  Housing: Low Risk  (09/28/2023)  Transportation Needs: No Transportation Needs (09/27/2023)  Utilities: Not At Risk (09/27/2023)  Alcohol Screen: Low Risk  (09/26/2021)  Depression (PHQ2-9): Low Risk  (03/08/2023)  Financial Resource Strain: Low Risk  (12/26/2021)  Physical Activity: Insufficiently Active (09/26/2021)  Social Connections: Unknown (09/28/2023)  Stress: No Stress Concern Present (09/26/2021)  Tobacco Use: Low Risk  (09/27/2023)     Readmission Risk Interventions    09/30/2023    9:26 AM  12/27/2021   10:50 AM 09/15/2021    2:29 PM  Readmission Risk Prevention Plan  Transportation Screening Complete Complete Complete  PCP or Specialist Appt within 3-5 Days  Complete Complete  HRI or Home Care Consult  Complete Complete  Social Work Consult for Recovery Care Planning/Counseling  Complete Complete  Palliative Care Screening  Not Applicable Not Applicable  Medication Review [employer]) Referral to Pharmacy Complete Complete  PCP or Specialist appointment within 3-5 days of discharge Complete    HRI or Home Care Consult Complete    SW Recovery Care/Counseling Consult Complete    Palliative Care Screening Not Applicable    Skilled Nursing Facility Not Applicable

## 2023-09-30 NOTE — Plan of Care (Signed)
  Problem: Education: Goal: Knowledge of General Education information will improve Description: Including pain rating scale, medication(s)/side effects and non-pharmacologic comfort measures Outcome: Progressing   Problem: Health Behavior/Discharge Planning: Goal: Ability to manage health-related needs will improve Outcome: Progressing   Problem: Clinical Measurements: Goal: Ability to maintain clinical measurements within normal limits will improve Outcome: Progressing Goal: Will remain free from infection Outcome: Progressing Goal: Diagnostic test results will improve Outcome: Progressing Goal: Respiratory complications will improve Outcome: Progressing Goal: Cardiovascular complication will be avoided Outcome: Progressing   Problem: Activity: Goal: Risk for activity intolerance will decrease Outcome: Progressing   Problem: Nutrition: Goal: Adequate nutrition will be maintained Outcome: Progressing   Problem: Coping: Goal: Level of anxiety will decrease Outcome: Progressing   Problem: Elimination: Goal: Will not experience complications related to bowel motility Outcome: Progressing Goal: Will not experience complications related to urinary retention Outcome: Progressing   Problem: Pain Managment: Goal: General experience of comfort will improve and/or be controlled Outcome: Progressing   Problem: Safety: Goal: Ability to remain free from injury will improve Outcome: Progressing   Problem: Skin Integrity: Goal: Risk for impaired skin integrity will decrease Outcome: Progressing   Problem: Education: Goal: Knowledge of disease and its progression will improve Outcome: Progressing   Problem: Health Behavior/Discharge Planning: Goal: Ability to manage health-related needs will improve Outcome: Progressing   Problem: Clinical Measurements: Goal: Complications related to the disease process or treatment will be avoided or minimized Outcome:  Progressing Goal: Dialysis access will remain free of complications Outcome: Progressing   Problem: Activity: Goal: Activity intolerance will improve Outcome: Progressing   Problem: Fluid Volume: Goal: Fluid volume balance will be maintained or improved Outcome: Progressing   Problem: Nutritional: Goal: Ability to make appropriate dietary choices will improve Outcome: Progressing   Problem: Respiratory: Goal: Respiratory symptoms related to disease process will be avoided Outcome: Progressing   Problem: Self-Concept: Goal: Body image disturbance will be avoided or minimized Outcome: Progressing   Problem: Urinary Elimination: Goal: Progression of disease will be identified and treated Outcome: Progressing   Problem: Education: Goal: Ability to describe self-care measures that may prevent or decrease complications (Diabetes Survival Skills Education) will improve Outcome: Progressing Goal: Individualized Educational Video(s) Outcome: Progressing   Problem: Coping: Goal: Ability to adjust to condition or change in health will improve Outcome: Progressing   Problem: Fluid Volume: Goal: Ability to maintain a balanced intake and output will improve Outcome: Progressing   Problem: Health Behavior/Discharge Planning: Goal: Ability to identify and utilize available resources and services will improve Outcome: Progressing Goal: Ability to manage health-related needs will improve Outcome: Progressing   Problem: Metabolic: Goal: Ability to maintain appropriate glucose levels will improve Outcome: Progressing   Problem: Nutritional: Goal: Maintenance of adequate nutrition will improve Outcome: Progressing Goal: Progress toward achieving an optimal weight will improve Outcome: Progressing   Problem: Skin Integrity: Goal: Risk for impaired skin integrity will decrease Outcome: Progressing   Problem: Tissue Perfusion: Goal: Adequacy of tissue perfusion will  improve Outcome: Progressing

## 2023-10-01 ENCOUNTER — Other Ambulatory Visit (HOSPITAL_COMMUNITY): Payer: Self-pay

## 2023-10-01 NOTE — Progress Notes (Signed)
 Pre op note Patient having EGD 8/22, admitted 8/15-22 for AKI, low blood pressures. Since admitted to hospital, asked our anesthesia PA if felt okay to proceed with procedure, and they reported optimized and there was nothing else to be done prior to procedure. Also asked about patients medications to be taken am of procedure, Amiodarone ,Digoxin  and Metoprolol  all approved to take am of procedure. Patient and wife made aware of this as well, and that if blood pressures start to change to let us  or office know.

## 2023-10-02 ENCOUNTER — Other Ambulatory Visit (HOSPITAL_COMMUNITY): Payer: Self-pay

## 2023-10-03 ENCOUNTER — Other Ambulatory Visit (HOSPITAL_COMMUNITY): Payer: Self-pay

## 2023-10-03 DIAGNOSIS — E1165 Type 2 diabetes mellitus with hyperglycemia: Secondary | ICD-10-CM | POA: Diagnosis not present

## 2023-10-03 DIAGNOSIS — I5042 Chronic combined systolic (congestive) and diastolic (congestive) heart failure: Secondary | ICD-10-CM | POA: Diagnosis not present

## 2023-10-03 DIAGNOSIS — I482 Chronic atrial fibrillation, unspecified: Secondary | ICD-10-CM | POA: Diagnosis not present

## 2023-10-03 DIAGNOSIS — I130 Hypertensive heart and chronic kidney disease with heart failure and stage 1 through stage 4 chronic kidney disease, or unspecified chronic kidney disease: Secondary | ICD-10-CM | POA: Diagnosis not present

## 2023-10-03 DIAGNOSIS — I4892 Unspecified atrial flutter: Secondary | ICD-10-CM | POA: Diagnosis not present

## 2023-10-03 DIAGNOSIS — E1122 Type 2 diabetes mellitus with diabetic chronic kidney disease: Secondary | ICD-10-CM | POA: Diagnosis not present

## 2023-10-03 DIAGNOSIS — N1831 Chronic kidney disease, stage 3a: Secondary | ICD-10-CM | POA: Diagnosis not present

## 2023-10-03 DIAGNOSIS — E1142 Type 2 diabetes mellitus with diabetic polyneuropathy: Secondary | ICD-10-CM | POA: Diagnosis not present

## 2023-10-03 DIAGNOSIS — N179 Acute kidney failure, unspecified: Secondary | ICD-10-CM | POA: Diagnosis not present

## 2023-10-04 ENCOUNTER — Ambulatory Visit (HOSPITAL_BASED_OUTPATIENT_CLINIC_OR_DEPARTMENT_OTHER): Admitting: Anesthesiology

## 2023-10-04 ENCOUNTER — Other Ambulatory Visit (HOSPITAL_COMMUNITY): Payer: Self-pay

## 2023-10-04 ENCOUNTER — Other Ambulatory Visit: Payer: Self-pay

## 2023-10-04 ENCOUNTER — Encounter (HOSPITAL_COMMUNITY): Payer: Self-pay | Admitting: Gastroenterology

## 2023-10-04 ENCOUNTER — Encounter (HOSPITAL_COMMUNITY)
Admission: RE | Disposition: A | Discharge status: Home / Self Care | Payer: Self-pay | Source: Home / Self Care | Attending: Gastroenterology

## 2023-10-04 ENCOUNTER — Ambulatory Visit (HOSPITAL_COMMUNITY): Admitting: Anesthesiology

## 2023-10-04 ENCOUNTER — Telehealth: Payer: Self-pay | Admitting: Nurse Practitioner

## 2023-10-04 ENCOUNTER — Ambulatory Visit (HOSPITAL_COMMUNITY)
Admission: RE | Admit: 2023-10-04 | Discharge: 2023-10-04 | Disposition: A | Discharge status: Home / Self Care | Attending: Gastroenterology | Admitting: Gastroenterology

## 2023-10-04 DIAGNOSIS — R1013 Epigastric pain: Secondary | ICD-10-CM | POA: Diagnosis not present

## 2023-10-04 DIAGNOSIS — I2510 Atherosclerotic heart disease of native coronary artery without angina pectoris: Secondary | ICD-10-CM

## 2023-10-04 DIAGNOSIS — Z95810 Presence of automatic (implantable) cardiac defibrillator: Secondary | ICD-10-CM | POA: Insufficient documentation

## 2023-10-04 DIAGNOSIS — Z8546 Personal history of malignant neoplasm of prostate: Secondary | ICD-10-CM | POA: Insufficient documentation

## 2023-10-04 DIAGNOSIS — I509 Heart failure, unspecified: Secondary | ICD-10-CM | POA: Insufficient documentation

## 2023-10-04 DIAGNOSIS — K219 Gastro-esophageal reflux disease without esophagitis: Secondary | ICD-10-CM | POA: Diagnosis not present

## 2023-10-04 DIAGNOSIS — C165 Malignant neoplasm of lesser curvature of stomach, unspecified: Secondary | ICD-10-CM | POA: Diagnosis not present

## 2023-10-04 DIAGNOSIS — E119 Type 2 diabetes mellitus without complications: Secondary | ICD-10-CM | POA: Insufficient documentation

## 2023-10-04 DIAGNOSIS — I5043 Acute on chronic combined systolic (congestive) and diastolic (congestive) heart failure: Secondary | ICD-10-CM

## 2023-10-04 DIAGNOSIS — Z7984 Long term (current) use of oral hypoglycemic drugs: Secondary | ICD-10-CM | POA: Insufficient documentation

## 2023-10-04 DIAGNOSIS — I110 Hypertensive heart disease with heart failure: Secondary | ICD-10-CM | POA: Insufficient documentation

## 2023-10-04 DIAGNOSIS — C169 Malignant neoplasm of stomach, unspecified: Secondary | ICD-10-CM | POA: Diagnosis not present

## 2023-10-04 DIAGNOSIS — I4891 Unspecified atrial fibrillation: Secondary | ICD-10-CM | POA: Insufficient documentation

## 2023-10-04 DIAGNOSIS — I447 Left bundle-branch block, unspecified: Secondary | ICD-10-CM | POA: Diagnosis not present

## 2023-10-04 HISTORY — PX: ESOPHAGOGASTRODUODENOSCOPY: SHX5428

## 2023-10-04 LAB — GLUCOSE, CAPILLARY
Glucose-Capillary: 78 mg/dL (ref 70–99)
Glucose-Capillary: 90 mg/dL (ref 70–99)

## 2023-10-04 SURGERY — EGD (ESOPHAGOGASTRODUODENOSCOPY)
Anesthesia: Monitor Anesthesia Care

## 2023-10-04 MED ORDER — PHENYLEPHRINE HCL-NACL 20-0.9 MG/250ML-% IV SOLN
INTRAVENOUS | Status: DC | PRN
  Administered 2023-10-04: 50 ug/min via INTRAVENOUS

## 2023-10-04 MED ORDER — GLYCOPYRROLATE 0.2 MG/ML IJ SOLN
INTRAMUSCULAR | Status: DC | PRN
  Administered 2023-10-04: .2 mg via INTRAVENOUS

## 2023-10-04 MED ORDER — PROPOFOL 10 MG/ML IV BOLUS
INTRAVENOUS | Status: DC | PRN
  Administered 2023-10-04: 20 mg via INTRAVENOUS

## 2023-10-04 MED ORDER — PROPOFOL 1000 MG/100ML IV EMUL
INTRAVENOUS | Status: AC
  Filled 2023-10-04: qty 300

## 2023-10-04 MED ORDER — SODIUM CHLORIDE 0.9 % IV SOLN: INTRAVENOUS | Status: DC

## 2023-10-04 MED ORDER — SODIUM CHLORIDE 0.9 % IV SOLN: INTRAVENOUS | Status: DC | PRN

## 2023-10-04 MED ORDER — ETOMIDATE 2 MG/ML IV SOLN
INTRAVENOUS | Status: DC | PRN
  Administered 2023-10-04: 6 mg via INTRAVENOUS
  Administered 2023-10-04: 4 mg via INTRAVENOUS

## 2023-10-04 MED ORDER — PROPOFOL 500 MG/50ML IV EMUL
INTRAVENOUS | Status: DC | PRN
  Administered 2023-10-04: 75 ug/kg/min via INTRAVENOUS

## 2023-10-04 MED ORDER — PHENYLEPHRINE HCL (PRESSORS) 10 MG/ML IV SOLN
INTRAVENOUS | Status: AC
  Filled 2023-10-04: qty 1

## 2023-10-04 MED ORDER — PROPOFOL 500 MG/50ML IV EMUL
INTRAVENOUS | Status: AC
  Filled 2023-10-04: qty 200

## 2023-10-04 NOTE — H&P (Signed)
 Jon Williams HPI: The patient complains about abdominal pain and he has a history of gastric cancer.  A repeat EGD was scheduled to further investigate his symptoms.  Past Medical History:  Diagnosis Date   AICD (automatic cardioverter/defibrillator) present 05/25/2016   biv icd   Angio-edema    Bunion, right    Chronic combined systolic and diastolic CHF (congestive heart failure) (HCC)    Echo 1/18: Mild conc LVH, EF 15-20, severe diff HK, inf and inf-septal AK, Gr 3 DD, mild to mod MR, severe LAE, mod reduced RVSF, mod RAE, mild TR, PASP 50   Coronary artery disease involving native coronary artery without angina pectoris 04/17/2016   LHC 1/18: pLCx 30, mLCx 20, mRCA 40, dRCA 20, LVEDP 23, mean RA 8, PA 42/20, PCWP 17   Diabetes mellitus without complication (HCC)    Gastric cancer (HCC)    History of atrial fibrillation    History of atrial flutter    History of cardiomegaly 06/07/2016   Noted on CXR   History of colon polyps 06/28/2017   Noted on colonoscopy   Hypertension    LBBB (left bundle branch block)    Nausea vomiting and diarrhea 04/04/2021   NICM (nonischemic cardiomyopathy) (HCC)    Echo 1/18:  Mild conc LVH, EF 15-20, severe diff HK, inf and inf-septal AK, Gr 3 DD, mild to mod MR, severe LAE, mod reduced RVSF, mod RAE, mild TR, PASP 50   Other secondary pulmonary hypertension (HCC) 04/17/2016   Prostate cancer (HCC) 2019   Sigmoid diverticulosis 06/28/2017   Noted on colonoscopy    Past Surgical History:  Procedure Laterality Date   BIOPSY  12/01/2020   Procedure: BIOPSY;  Surgeon: Rollin Dover, MD;  Location: [facility] ENDOSCOPY;  Service: Endoscopy;;   BIOPSY  06/09/2021   Procedure: BIOPSY;  Surgeon: Rollin Dover, MD;  Location: [facility] ENDOSCOPY;  Service: Gastroenterology;;   BIOPSY  06/29/2022   Procedure: BIOPSY;  Surgeon: Rollin Dover, MD;  Location: [facility] ENDOSCOPY;  Service: Gastroenterology;;   BIV ICD INSERTION CRT-D N/A 05/25/2016   Procedure: BiV ICD  Insertion CRT-D;  Surgeon: Danelle LELON Birmingham, MD;  Location: [facility] INVASIVE CV LAB;  Service: Cardiovascular;  Laterality: N/A;   CARDIAC CATHETERIZATION N/A 03/02/2016   Procedure: Right/Left Heart Cath and Coronary Angiography;  Surgeon: Lonni Hanson, MD;  Location: [facility] INVASIVE CV LAB;  Service: Cardiovascular;  Laterality: N/A;   CARDIOVERSION N/A 07/17/2016   Procedure: Cardioversion;  Surgeon: Birmingham Danelle LELON, MD;  Location: [facility] INVASIVE CV LAB;  Service: Cardiovascular;  Laterality: N/A;   COLONOSCOPY WITH PROPOFOL  N/A 06/28/2017   Procedure: COLONOSCOPY WITH PROPOFOL ;  Surgeon: Rollin Dover, MD;  Location: [facility] ENDOSCOPY;  Service: Endoscopy;  Laterality: N/A;   colonscopy  2009   ESOPHAGOGASTRODUODENOSCOPY N/A 12/01/2020   Procedure: ESOPHAGOGASTRODUODENOSCOPY (EGD);  Surgeon: Rollin Dover, MD;  Location[facility] ENDOSCOPY;  Service: Endoscopy;  Laterality: N/A;  IDA/guaiac positive stools   ESOPHAGOGASTRODUODENOSCOPY (EGD) WITH PROPOFOL  N/A 12/16/2020   Procedure: ESOPHAGOGASTRODUODENOSCOPY (EGD) WITH PROPOFOL ;  Surgeon: Rollin Dover, MD;  Location: [facility] ENDOSCOPY;  Service: Endoscopy;  Laterality: N/A;   ESOPHAGOGASTRODUODENOSCOPY (EGD) WITH PROPOFOL  N/A 06/09/2021   Procedure: ESOPHAGOGASTRODUODENOSCOPY (EGD) WITH PROPOFOL ;  Surgeon: Rollin Dover, MD;  Location: [facility] ENDOSCOPY;  Service: Gastroenterology;  Laterality: N/A;   ESOPHAGOGASTRODUODENOSCOPY (EGD) WITH PROPOFOL  N/A 06/29/2022   Procedure: ESOPHAGOGASTRODUODENOSCOPY (EGD) WITH PROPOFOL ;  Surgeon: Rollin Dover, MD;  Location: [facility] ENDOSCOPY;  Service: Gastroenterology;  Laterality: N/A;   GOLD SEED IMPLANT N/A 12/24/2017  Procedure: GOLD SEED IMPLANT, TRANSERINEAL;  Surgeon: Nieves Cough, MD;  Location: [facility] ORS;  Service: Urology;  Laterality: N/A;   INSERT / REPLACE / REMOVE PACEMAKER     LEAD REVISION  10/10/2018   LEAD REVISION/REPAIR N/A 10/10/2018   Procedure: LEAD REVISION/REPAIR;  Surgeon: Waddell Danelle ORN, MD;  Location: [facility] INVASIVE  CV LAB;  Service: Cardiovascular;  Laterality: N/A;   POLYPECTOMY  06/28/2017   Procedure: POLYPECTOMY;  Surgeon: Rollin Dover, MD;  Location: [facility] ENDOSCOPY;  Service: Endoscopy;;  ascending and descending colon polyp   PORTACATH PLACEMENT Right 12/23/2020   Procedure: INSERTION PORT-A-CATH;  Surgeon: Dasie Leonor CROME, MD;  Location: [facility] OR;  Service: General;  Laterality: Right;   PROSTATE BIOPSY  02/20/2017   RIGHT HEART CATH N/A 07/25/2023   Procedure: RIGHT HEART CATH;  Surgeon: Rolan Ezra RAMAN, MD;  Location: [facility] INVASIVE CV LAB;  Service: Cardiovascular;  Laterality: N/A;   RIGHT/LEFT HEART CATH AND CORONARY ANGIOGRAPHY N/A 10/30/2022   Procedure: RIGHT/LEFT HEART CATH AND CORONARY ANGIOGRAPHY;  Surgeon: Rolan Ezra RAMAN, MD;  Location: [facility] INVASIVE CV LAB;  Service: Cardiovascular;  Laterality: N/A;   SPACE OAR INSTILLATION N/A 12/24/2017   Procedure: SPACE OAR INSTILLATION;  Surgeon: Nieves Cough, MD;  Location: [facility] ORS;  Service: Urology;  Laterality: N/A;   TOTAL KNEE ARTHROPLASTY Right 08/16/2017   Procedure: RIGHT TOTAL KNEE ARTHROPLASTY;  Surgeon: Shari Sieving, MD;  Location: [facility] OR;  Service: Orthopedics;  Laterality: Right;   UPPER ESOPHAGEAL ENDOSCOPIC ULTRASOUND (EUS) N/A 12/16/2020   Procedure: UPPER ESOPHAGEAL ENDOSCOPIC ULTRASOUND (EUS);  Surgeon: Rollin Dover, MD;  Location: [facility] ENDOSCOPY;  Service: Endoscopy;  Laterality: N/A;    Family History  Problem Relation Age of Onset   Hypertension Mother    Heart disease Mother    Diabetes Mother    Diabetes Father    Hypertension Father    Cancer Father        lung cancer   Healthy Sister    Heart attack Brother    Heart disease Brother 93       + tobacco   Healthy Brother     Social History:  reports that he has never smoked. He has never been exposed to tobacco smoke. He has never used smokeless tobacco. He reports that he does not drink alcohol and does not use drugs.  Allergies:  Allergies  Allergen Reactions    Aspirin  Anaphylaxis and Hives   Sacubitril -Valsartan  Swelling    lip swelling, angioedema   Sulfa Antibiotics Anaphylaxis, Hives, Swelling and Other (See Comments)    Swollen lips   Ace Inhibitors     Possible angioedema    Medications: Scheduled: Continuous:  sodium chloride       No results found. However, due to the size of the patient record, not all encounters were searched. Please check Results Review for a complete set of results.   No results found.  ROS:  As stated above in the HPI otherwise negative.  Blood pressure 118/65, pulse 64, temperature 97.6 F (36.4 C), temperature source Temporal, resp. rate 18, SpO2 100%.    PE: Gen: NAD, Alert and Oriented HEENT:  [place]/AT, EOMI Neck: Supple, no LAD Lungs: CTA Bilaterally CV: RRR without M/G/R ABD: Soft, NTND, +BS Ext: No C/C/E  Assessment/Plan: 1) Abdominal pain - EGD. 2) Gastric cancer.  Jon Williams D 10/04/2023, 9:35 AM

## 2023-10-04 NOTE — Discharge Instructions (Signed)
YOU HAD AN ENDOSCOPIC PROCEDURE TODAY: Refer to the procedure report and other information in the discharge instructions given to you for any specific questions about what was found during the examination. If this information does not answer your questions, please call [facility] at [phone] to clarify.  ? ?YOU SHOULD EXPECT: Some feelings of bloating in the abdomen. Passage of more gas than usual. Walking can help get rid of the air that was put into your GI tract during the procedure and reduce the bloating. ? ?DIET: Your first meal following the procedure should be a light meal and then it is ok to progress to your normal diet. A half-sandwich or bowl of soup is an example of a good first meal. Heavy or fried foods are harder to digest and may make you feel nauseous or bloated. Drink plenty of fluids but you should avoid alcoholic beverages for 24 hours.  ? ?ACTIVITY: Your care partner should take you home directly after the procedure. You should plan to take it easy, moving slowly for the rest of the day. You can resume normal activity the day after the procedure however YOU SHOULD NOT DRIVE, use power tools, machinery or perform tasks that involve climbing or major physical exertion for 24 hours (because of the sedation medicines used during the test).  ? ?SYMPTOMS TO REPORT IMMEDIATELY: ?A gastroenterologist can be reached at any hour. Please call [phone]  for any of the following symptoms:  ? ?Following upper endoscopy (EGD, EUS, ERCP, esophageal dilation) ?Vomiting of blood or coffee ground material  ?New, significant abdominal pain  ?New, significant chest pain or pain under the shoulder blades  ?Painful or persistently difficult swallowing  ?New shortness of breath  ?Black, tarry-looking or red, bloody stools ? ?FOLLOW UP:  ?If any biopsies were taken you will be contacted by phone or by letter within the next 1-3 weeks. Call [phone]  if you have not heard about the biopsies in 3  weeks.  ?Please also call with any specific questions about appointments or follow up tests.  ?

## 2023-10-04 NOTE — Op Note (Signed)
 [facility] Patient Name: Jon Williams Procedure Date: 10/04/2023 MRN: [id number] Attending MD: Belvie Just , MD, [id number] Date of Birth: 1950/02/28 CSN: [id number] Age: 73 Admit Type: Outpatient Procedure:                Upper GI endoscopy Indications:              Epigastric abdominal pain Providers:                Belvie Just, MD, Ritta Debbie Alert, RN,                            Curtistine Bishop, Technician Referring MD:              Medicines:                Propofol  per Anesthesia Complications:            No immediate complications. Estimated Blood Loss:     Estimated blood loss: none. Procedure:                Pre-Anesthesia Assessment:                           - Prior to the procedure, a History and Physical                            was performed, and patient medications and                            allergies were reviewed. The patient's tolerance of                            previous anesthesia was also reviewed. The risks                            and benefits of the procedure and the sedation                            options and risks were discussed with the patient.                            All questions were answered, and informed consent                            was obtained. Prior Anticoagulants: The patient has                            taken no anticoagulant or antiplatelet agents. ASA                            Grade Assessment: III - A patient with severe                            systemic disease. After reviewing the risks and  benefits, the patient was deemed in satisfactory                            condition to undergo the procedure.                           - Sedation was administered by an anesthesia                            professional. Deep sedation was attained.                           After obtaining informed consent, the endoscope was                            passed under direct  vision. Throughout the                            procedure, the patient's blood pressure, pulse, and                            oxygen saturations were monitored continuously. The                            [id number]-H190 ([id number]) Olympus endoscope was introduced                            through the mouth, and advanced to the second part                            of duodenum. The upper GI endoscopy was                            accomplished without difficulty. The patient                            tolerated the procedure well. Scope In: Scope Out: Findings:      The esophagus was normal.      A large, fungating, infiltrative and ulcerated, non-circumferential mass       with no bleeding and no stigmata of recent bleeding was found on the       lesser curvature of the stomach. Biopsies were taken with a cold forceps       for histology.      The examined duodenum was normal.      A large fungating ulcerated mass was noted covering the entire lesser       curvature and extending in to the antrum and pylorus region. The anatomy       in this distal part of the stomach was markedly distorted. Compared to       the EGD on 06/2022, where a small scar was noted, this was a significant       departure from the prior findings. Multiple cold biopsies were obtained. Impression:               - Normal esophagus.                           -  Malignant gastric tumor on the lesser curvature                            of the stomach. Biopsied.                           - Normal examined duodenum. Moderate Sedation:      Not Applicable - Patient had care per Anesthesia. Recommendation:           - Patient has a contact number available for                            emergencies. The signs and symptoms of potential                            delayed complications were discussed with the                            patient. Return to normal activities tomorrow.                            Written discharge  instructions were provided to the                            patient.                           - Resume regular diet.                           - Await pathology results. Procedure Code(s):        --- Professional ---                           [phone], Esophagogastroduodenoscopy, flexible,                            transoral; with biopsy, single or multiple Diagnosis Code(s):        --- Professional ---                           C16.5, Malignant neoplasm of lesser curvature of                            stomach, unspecified                           R10.13, Epigastric pain CPT copyright 2022 [employer]. All rights reserved. The codes documented in this report are preliminary and upon coder review may  be revised to meet current compliance requirements. Belvie Just, MD Belvie Just, MD 10/04/2023 10:41:12 AM This report has been signed electronically. Number of Addenda: 0

## 2023-10-04 NOTE — Anesthesia Preprocedure Evaluation (Addendum)
 Anesthesia Evaluation  Patient identified by MRN, date of birth, ID band Patient awake    Reviewed: Allergy  & Precautions, NPO status , Patient's Chart, lab work & pertinent test results  History of Anesthesia Complications Negative for: history of anesthetic complications  Airway Mallampati: II  TM Distance: >3 FB Neck ROM: Full    Dental  (+) Dental Advisory Given   Pulmonary neg shortness of breath, neg sleep apnea, neg COPD, neg recent URI   Pulmonary exam normal breath sounds clear to auscultation       Cardiovascular hypertension (metoprolol ), Pt. on home beta blockers pulmonary hypertension(-) angina + CAD and +CHF  + dysrhythmias (LBBB, PVCs) Atrial Fibrillation + pacemaker + Cardiac Defibrillator (reports he has not been shocked) + Valvular Problems/Murmurs (moderate MR)  Rhythm:Regular Rate:Normal  RHC 07/25/2023: 1. Low cardiac output by thermodilution. Borderline by Fick.  2. Normal filling pressures and PA pressure.    TTE 02/01/2023: IMPRESSIONS    1. Left ventricular ejection fraction, by estimation, is 20%. Left  ventricular ejection fraction by 3D volume is 21 %. The left ventricle has  severely decreased function. The left ventricle demonstrates global  hypokinesis. The left ventricular internal  cavity size was severely dilated. Left ventricular diastolic parameters  are indeterminate. The average left ventricular global longitudinal strain  is -4.3 %. The global longitudinal strain is abnormal.   2. Device leads in RA/RV . Right ventricular systolic function is normal.  The right ventricular size is normal. There is normal pulmonary artery  systolic pressure. The estimated right ventricular systolic pressure is  16.5 mmHg.   3. Left atrial size was severely dilated.   4. Right atrial size was moderately dilated.   5. The mitral valve is abnormal. Moderate mitral valve regurgitation.   6. The aortic valve  is tricuspid. There is mild calcification of the  aortic valve. Aortic valve regurgitation is not visualized. Aortic valve  sclerosis is present, with no evidence of aortic valve stenosis.   7. The inferior vena cava is normal in size with greater than 50%  respiratory variability, suggesting right atrial pressure of 3 mmHg.      Neuro/Psych neg Seizures negative neurological ROS     GI/Hepatic Neg liver ROS,GERD  Medicated,,Stomach cancer, diverticulosis    Endo/Other  diabetes, Type 2, Oral Hypoglycemic Agents    Renal/GU Renal disease (admitted 8/15-22 for AK)   H/o prostate cancer    Musculoskeletal   Abdominal   Peds  Hematology  (+) Blood dyscrasia, anemia Lab Results      Component                Value               Date                      WBC                      7.9                 09/28/2023                HGB                      9.1 (L)             09/28/2023                HCT  28.1 (L)            09/28/2023                MCV                      92.4                09/28/2023                PLT                      339                 09/28/2023              Anesthesia Other Findings Last Jardiance : yesterday  Last Xarelto : 10/01/2023  Reproductive/Obstetrics                              Anesthesia Physical Anesthesia Plan  ASA: 4  Anesthesia Plan: MAC   Post-op Pain Management: Minimal or no pain anticipated   Induction: Intravenous  PONV Risk Score and Plan: 1 and Propofol  infusion, TIVA and Treatment may vary due to age or medical condition  Airway Management Planned: Natural Airway and Nasal Cannula  Additional Equipment:   Intra-op Plan:   Post-operative Plan:   Informed Consent: I have reviewed the patients History and Physical, chart, labs and discussed the procedure including the risks, benefits and alternatives for the proposed anesthesia with the patient or authorized representative  who has indicated his/her understanding and acceptance.     Dental advisory given  Plan Discussed with: Anesthesiologist and CRNA  Anesthesia Plan Comments: (Discussed with patient risks of MAC including, but not limited to, minor pain or discomfort, hearing people in the room, and possible need for backup general anesthesia. Risks for general anesthesia also discussed including, but not limited to, sore throat, hoarse voice, chipped/damaged teeth, injury to vocal cords, nausea and vomiting, allergic reactions, lung infection, heart attack, stroke, and death. All questions answered. )         Anesthesia Quick Evaluation

## 2023-10-04 NOTE — Transfer of Care (Signed)
 Immediate Anesthesia Transfer of Care Note  Patient: Jon Williams  Procedure(s) Performed: EGD (ESOPHAGOGASTRODUODENOSCOPY)  Patient Location: PACU  Anesthesia Type:MAC  Level of Consciousness: awake, alert , and oriented  Airway & Oxygen Therapy: Patient Spontanous Breathing and Patient connected to face mask oxygen  Post-op Assessment: Report given to RN  Post vital signs: Reviewed and stable  Last Vitals:  Vitals Value Taken Time  BP 111/65 10/04/23 10:37  Temp    Pulse 60 10/04/23 10:40  Resp 14 10/04/23 10:40  SpO2 100 % 10/04/23 10:40  Vitals shown include unfiled device data.  Last Pain:  Vitals:   10/04/23 1037  TempSrc:   PainSc: Asleep         Complications: No notable events documented.

## 2023-10-04 NOTE — Addendum Note (Signed)
 Addendum  created 10/04/23 1110 by Dartha Meckel, CRNA   Flowsheet accepted

## 2023-10-04 NOTE — Telephone Encounter (Signed)
 Called pt and spoke with regarding his appt on Tuesday.

## 2023-10-04 NOTE — Anesthesia Postprocedure Evaluation (Signed)
 Anesthesia Post Note  Patient: [name]  Procedure(s) Performed: EGD (ESOPHAGOGASTRODUODENOSCOPY)     Patient location during evaluation: PACU Anesthesia Type: MAC Level of consciousness: awake Pain management: pain level controlled Vital Signs Assessment: post-procedure vital signs reviewed and stable Respiratory status: spontaneous breathing, nonlabored ventilation and respiratory function stable Cardiovascular status: stable and blood pressure returned to baseline Postop Assessment: no apparent nausea or vomiting Anesthetic complications: no   No notable events documented.  Last Vitals:  Vitals:   10/04/23 0925 10/04/23 1037  BP: 118/65 111/65  Pulse: 64 66  Resp: 18 16  Temp: 36.4 C   SpO2: 100% 100%    Last Pain:  Vitals:   10/04/23 1037  TempSrc:   PainSc: Asleep                 Delon Aisha Arch

## 2023-10-06 ENCOUNTER — Encounter (HOSPITAL_COMMUNITY): Payer: Self-pay | Admitting: Gastroenterology

## 2023-10-06 DIAGNOSIS — E1169 Type 2 diabetes mellitus with other specified complication: Secondary | ICD-10-CM | POA: Diagnosis not present

## 2023-10-06 DIAGNOSIS — I739 Peripheral vascular disease, unspecified: Secondary | ICD-10-CM | POA: Diagnosis not present

## 2023-10-06 DIAGNOSIS — I4891 Unspecified atrial fibrillation: Secondary | ICD-10-CM | POA: Diagnosis not present

## 2023-10-06 DIAGNOSIS — I5022 Chronic systolic (congestive) heart failure: Secondary | ICD-10-CM | POA: Diagnosis not present

## 2023-10-06 NOTE — Progress Notes (Unsigned)
 [facility]   [phone] [phone]    Patient Care Team: [name], MD as PCP - General (Internal Medicine) [name], Aleene PARAS, MD (Inactive) as PCP - Cardiology (Cardiology) Waddell Danelle ORN, MD as PCP - Electrophysiology (Cardiology) Dasie Leonor CROME, MD as Consulting Physician (General Surgery) Lanny Callander, MD as Consulting Physician (Hematology) Rollin Dover, MD as Consulting Physician (Gastroenterology)   CHIEF COMPLAINT: Follow up h/o gastric and prostate cancers   Oncology History Overview Note  Cancer Staging Gastric cancer [facility]) Staging form: Stomach, AJCC 8th Edition - Clinical stage from 12/01/2020: Stage IIB (cT3, cN0, cM0) - Signed by Lanny Callander, MD on 12/20/2020 Stage prefix: Initial diagnosis Total positive nodes: 0  Malignant neoplasm of prostate (HCC) Staging form: Prostate, AJCC 8th Edition - Clinical: Stage IIC (cT2b, cN0, cM0, PSA: 6.7, Grade Group: 4) - Unsigned Prostate specific antigen (PSA) range: Less than 10 Gleason score: 8 Histologic grading system: 5 grade system    Gastric cancer (HCC)  12/01/2020 Procedure   Upper Endoscopy, Dr. Rollin  Impression: - Normal esophagus. 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 Biopsied. - Normal examined duodenum.   12/01/2020 Pathology Results   FINAL MICROSCOPIC DIAGNOSIS:   A. INCISURA, BIOPSY:  - Adenocarcinoma, moderate to poorly differentiated arising in a  background of chronic gastritis with intestinal metaplasia.    12/01/2020 Imaging   CT CAP  IMPRESSION: Focal wall thickening along the posterior aspect of the gastric antrum, likely corresponding to the patient's newly diagnosed gastric cancer.   No findings suspicious for metastatic disease.   Mild multifocal pneumonia, lower lobe predominant, likely on the basis of aspiration. Trace right pleural effusion.   Fiducial markers along the prostate in this patient with known prostate cancer.    12/01/2020 Cancer Staging   Staging form: Stomach, AJCC 8th Edition - Clinical stage from 12/01/2020: Stage IIB (cT3, cN0, cM0) - Signed by Lanny Callander, MD on 12/20/2020 Stage prefix: Initial diagnosis Total positive nodes: 0   12/08/2020 Initial Diagnosis   Gastric cancer (HCC)   12/16/2020 Procedure   EUS  - Wall thickening was seen in the lesser curve of the stomach and in the antrum of the stomach. The thickening appeared to be primarily within the serosa (Layer 5). T3 N0 Mx. - There was no sign of significant pathology in the entire pancreas. - There was no sign of significant pathology in the common bile duct and in the gallbladder. - There was no evidence of significant pathology in the left lobe of the liver. - Endosonographic images of the left adrenal gland were unremarkable. - The celiac trunk and superior mesenteric artery were endosonographically normal. - The mediastinum was unremarkable endosonographically. - No specimens collected.   01/02/2021 - 03/23/2021 Chemotherapy   Patient is on Treatment Plan : GASTROESOPHAGEAL FLOT q14d X 4 cycles     04/01/2021 Imaging   EXAM: CT ABDOMEN AND PELVIS WITHOUT CONTRAST  IMPRESSION: 1. No acute findings in the abdomen or pelvis. Specifically, no evidence for metastatic disease in the abdomen or pelvis. 2. Stable 16 mm left adrenal adenoma. 3. Small fat containing hernias in the right groin and umbilical region. 4. Bilateral pars interarticularis defects at L4 with 10 mm anterolisthesis of L4 on 5, stable. 5. Aortic Atherosclerosis (ICD10-I70.0).   04/13/2021 Imaging   EXAM: CT CHEST WITHOUT CONTRAST  IMPRESSION: 1. No evidence of metastatic disease in the chest. 2. Enlargement of the main pulmonary artery, as can be seen  in pulmonary hypertension. 3. Coronary artery disease.   Aortic Atherosclerosis (ICD10-I70.0).   06/09/2021 Procedure   Upper GI Endoscopy, Dr. Rollin  Findings: -The esophagus was normal. -A medium  healed ulcer was found on the lesser curvature of the stomach. Biopsies were taken with a cold forceps for histology. -The examined duodenum was normal. -Compared to the prior EGD there is a marked improvement in his gastric cancer. There was only evidence of erythema and a central scar at the incisura. Cold biopsies of the area were obtained, but there was no gross evidence of malignancy.  Impression: - Normal esophagus. - Scar in the lesser curvature of the stomach. Biopsied. - Normal examined duodenum.   06/09/2021 Pathology Results   FINAL MICROSCOPIC DIAGNOSIS:   A. STOMACH, BIOPSY:  - Gastric mucosa with acute and chronic inflammation.  - No dysplasia or malignancy identified.    06/16/2021 - 06/16/2021 Chemotherapy   Patient is on Treatment Plan : GASTROESOPHAGEAL Pembrolizumab (200) q21d        CURRENT THERAPY: Surveillance; pending restart therapy for recurrence   INTERVAL HISTORY Jon Williams returns for follow up. Has been in/out of the hospital over past month for abdominal pain. CT 08/30/23 showed antral and distal gastric wall thickening, and CT 09/27/23 was stable, both without distinct mass. EGD showed malignant appearing  mass in the lesser curvature of the stomach, path is pending. He reports postprandial pain up to 7 out of 10, worse today, with reflux type burning, on pantoprazole  twice a day which does help.  Tylenol  is not very helpful.  Denies nausea/vomiting, constipation, diarrhea, or rectal bleeding.  Takes oral iron  once a day.  He has progressive weight loss, working with PT at home to maintain his strength and continues to try to exercise.  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  ROS  Other systems reviewed and negative  Past Medical History:  Diagnosis Date   AICD (automatic cardioverter/defibrillator) present 05/25/2016   biv icd   Angio-edema    Bunion, right    Chronic combined systolic and diastolic CHF  (congestive heart failure) (HCC)    Echo 1/18: Mild conc LVH, EF 15-20, severe diff HK, inf and inf-septal AK, Gr 3 DD, mild to mod MR, severe LAE, mod reduced RVSF, mod RAE, mild TR, PASP 50   Coronary artery disease involving native coronary artery without angina pectoris 04/17/2016   LHC 1/18: pLCx 30, mLCx 20, mRCA 40, dRCA 20, LVEDP 23, mean RA 8, PA 42/20, PCWP 17   Diabetes mellitus without complication (HCC)    Gastric cancer (HCC)    History of atrial fibrillation    History of atrial flutter    History of cardiomegaly 06/07/2016   Noted on CXR   History of colon polyps 06/28/2017   Noted on colonoscopy   Hypertension    LBBB (left bundle branch block)    Nausea vomiting and diarrhea 04/04/2021   NICM (nonischemic cardiomyopathy) (HCC)    Echo 1/18:  Mild conc LVH, EF 15-20, severe diff HK, inf and inf-septal AK, Gr 3 DD, mild to mod MR, severe LAE, mod reduced RVSF, mod RAE, mild TR, PASP 50   Other secondary pulmonary hypertension (HCC) 04/17/2016   Prostate cancer (HCC) 2019   Sigmoid diverticulosis 06/28/2017   Noted on colonoscopy     Past Surgical History:  Procedure Laterality Date   BIOPSY  12/01/2020   Procedure: BIOPSY;  Surgeon: Rollin Dover, MD;  Location: [facility]  ENDOSCOPY;  Service: Endoscopy;;   BIOPSY  06/09/2021   Procedure: BIOPSY;  Surgeon: Rollin Dover, MD;  Location: [facility];  Service: Gastroenterology;;   BIOPSY  06/29/2022   Procedure: BIOPSY;  Surgeon: Rollin Dover, MD;  Location: [facility];  Service: Gastroenterology;;   BIV ICD INSERTION CRT-D N/A 05/25/2016   Procedure: BiV ICD Insertion CRT-D;  Surgeon: Danelle LELON Birmingham, MD;  Location: [facility] INVASIVE CV LAB;  Service: Cardiovascular;  Laterality: N/A;   CARDIAC CATHETERIZATION N/A 03/02/2016   Procedure: Right/Left Heart Cath and Coronary Angiography;  Surgeon: Lonni Hanson, MD;  Location: [facility] INVASIVE CV LAB;  Service: Cardiovascular;  Laterality: N/A;   CARDIOVERSION N/A 07/17/2016   Procedure:  Cardioversion;  Surgeon: Birmingham Danelle LELON, MD;  Location: [facility] INVASIVE CV LAB;  Service: Cardiovascular;  Laterality: N/A;   COLONOSCOPY WITH PROPOFOL  N/A 06/28/2017   Procedure: COLONOSCOPY WITH PROPOFOL ;  Surgeon: Rollin Dover, MD;  Location: [facility] ENDOSCOPY;  Service: Endoscopy;  Laterality: N/A;   colonscopy  2009   ESOPHAGOGASTRODUODENOSCOPY N/A 12/01/2020   Procedure: ESOPHAGOGASTRODUODENOSCOPY (EGD);  Surgeon: Rollin Dover, MD;  Location: [facility] ENDOSCOPY;  Service: Endoscopy;  Laterality: N/A;  IDA/guaiac positive stools   ESOPHAGOGASTRODUODENOSCOPY N/A 10/04/2023   Procedure: EGD (ESOPHAGOGASTRODUODENOSCOPY);  Surgeon: Rollin Dover, MD;  Location: [facility];  Service: Gastroenterology;  Laterality: N/A;   ESOPHAGOGASTRODUODENOSCOPY (EGD) WITH PROPOFOL  N/A 12/16/2020   Procedure: ESOPHAGOGASTRODUODENOSCOPY (EGD) WITH PROPOFOL ;  Surgeon: Rollin Dover, MD;  Location: [facility] ENDOSCOPY;  Service: Endoscopy;  Laterality: N/A;   ESOPHAGOGASTRODUODENOSCOPY (EGD) WITH PROPOFOL  N/A 06/09/2021   Procedure: ESOPHAGOGASTRODUODENOSCOPY (EGD) WITH PROPOFOL ;  Surgeon: Rollin Dover, MD;  Location: [facility] ENDOSCOPY;  Service: Gastroenterology;  Laterality: N/A;   ESOPHAGOGASTRODUODENOSCOPY (EGD) WITH PROPOFOL  N/A 06/29/2022   Procedure: ESOPHAGOGASTRODUODENOSCOPY (EGD) WITH PROPOFOL ;  Surgeon: Rollin Dover, MD;  Location: [facility] ENDOSCOPY;  Service: Gastroenterology;  Laterality: N/A;   GOLD SEED IMPLANT N/A 12/24/2017   Procedure: GOLD SEED IMPLANT, TRANSERINEAL;  Surgeon: Nieves Cough, MD;  Location: [facility] ORS;  Service: Urology;  Laterality: N/A;   INSERT / REPLACE / REMOVE PACEMAKER     LEAD REVISION  10/10/2018   LEAD REVISION/REPAIR N/A 10/10/2018   Procedure: LEAD REVISION/REPAIR;  Surgeon: Birmingham Danelle LELON, MD;  Location: [facility] INVASIVE CV LAB;  Service: Cardiovascular;  Laterality: N/A;   POLYPECTOMY  06/28/2017   Procedure: POLYPECTOMY;  Surgeon: Rollin Dover, MD;  Location: [facility] ENDOSCOPY;  Service: Endoscopy;;   ascending and descending colon polyp   PORTACATH PLACEMENT Right 12/23/2020   Procedure: INSERTION PORT-A-CATH;  Surgeon: Dasie Leonor CROME, MD;  Location: [facility] OR;  Service: General;  Laterality: Right;   PROSTATE BIOPSY  02/20/2017   RIGHT HEART CATH N/A 07/25/2023   Procedure: RIGHT HEART CATH;  Surgeon: Rolan Ezra RAMAN, MD;  Location: [facility] INVASIVE CV LAB;  Service: Cardiovascular;  Laterality: N/A;   RIGHT/LEFT HEART CATH AND CORONARY ANGIOGRAPHY N/A 10/30/2022   Procedure: RIGHT/LEFT HEART CATH AND CORONARY ANGIOGRAPHY;  Surgeon: Rolan Ezra RAMAN, MD;  Location: [facility] INVASIVE CV LAB;  Service: Cardiovascular;  Laterality: N/A;   SPACE OAR INSTILLATION N/A 12/24/2017   Procedure: SPACE OAR INSTILLATION;  Surgeon: Nieves Cough, MD;  Location: [facility] ORS;  Service: Urology;  Laterality: N/A;   TOTAL KNEE ARTHROPLASTY Right 08/16/2017   Procedure: RIGHT TOTAL KNEE ARTHROPLASTY;  Surgeon: Shari Sieving, MD;  Location: [facility] OR;  Service: Orthopedics;  Laterality: Right;   UPPER ESOPHAGEAL ENDOSCOPIC ULTRASOUND (EUS) N/A 12/16/2020   Procedure: UPPER ESOPHAGEAL ENDOSCOPIC ULTRASOUND (EUS);  Surgeon: Rollin Dover, MD;  Location:  [facility] ENDOSCOPY;  Service: Endoscopy;  Laterality: N/A;     Outpatient Encounter Medications as of 10/08/2023  Medication Sig   Accu-Chek Softclix Lancets lancets Use to check your blood sugar finger stick once a day Dx Code E11.9 90 days   amiodarone  (PACERONE ) 200 MG tablet Take 1 tablet (200 mg total) by mouth 2 (two) times daily for 14 days, THEN 1 tablet (200 mg total) daily.   atorvastatin  (LIPITOR) 40 MG tablet Take 1 tablet (40 mg total) by mouth daily.   Biotin 1000 MCG tablet Take 1,000 mcg by mouth daily with breakfast.   Blood Glucose Monitoring Suppl (ACCU-CHEK GUIDE) w/Device KIT as directed to check blood sugar once a day   cetirizine  (ZYRTEC ) 10 MG tablet Take 1 tablet (10 mg total) by mouth daily.   cholecalciferol  (VITAMIN D3) 25 MCG (1000 UT) tablet Take 1,000  Units by mouth daily with breakfast.   clotrimazole-betamethasone (LOTRISONE) cream Apply 1 Application topically 2 (two) times daily.   digoxin  (LANOXIN ) 0.125 MG tablet Take 1/2 tablet (0.0625mg  total) by mouth daily.   empagliflozin  (JARDIANCE ) 10 MG TABS tablet Take 1 tablet (10 mg total) by mouth daily.   EPINEPHrine  0.3 mg/0.3 mL IJ SOAJ injection Inject 0.3 mg into the muscle as needed for anaphylaxis.   ferrous sulfate 325 (65 FE) MG tablet Take 325 mg by mouth daily with breakfast.   furosemide  (LASIX ) 40 MG tablet Take 1 tablet (40 mg total) by mouth daily.   gabapentin  (NEURONTIN ) 100 MG capsule Take 1 capsule (100 mg total) by mouth 2 (two) times daily.   glucose blood (ACCU-CHEK GUIDE TEST) test strip Use to check blood sugars once daily.   ipratropium (ATROVENT ) 0.03 % nasal spray Place 1-2 sprays into both nostrils 2 (two) times daily as needed (nasal drainage).   lidocaine -prilocaine  (EMLA ) cream Apply 1 Application topically as needed.   metFORMIN  (GLUCOPHAGE -XR) 500 MG 24 hr tablet Take 1 tablet (500 mg total) by mouth daily with evening meal.   metoprolol  succinate (TOPROL -XL) 25 MG 24 hr tablet Take 1 tablet (25 mg total) by mouth daily.   multivitamin (ONE-A-DAY MEN'S) TABS tablet Take 1 tablet by mouth daily with breakfast.   pantoprazole  (PROTONIX ) 40 MG tablet Take 1 tablet (40 mg total) by mouth daily.   pantoprazole  (PROTONIX ) 40 MG tablet Take 1 tablet (40 mg total) by mouth 2 (two) times daily.   potassium chloride  (KLOR-CON ) 10 MEQ tablet Take 2 tablets (20 mEq total) by mouth daily. (Patient taking differently: Take 10 mEq by mouth 2 (two) times daily.)   rivaroxaban  (XARELTO ) 20 MG TABS tablet Take 1 tablet (20 mg total) by mouth daily with supper.   spironolactone  (ALDACTONE ) 25 MG tablet Take 1 tablet (25 mg total) by mouth daily.   sucralfate  (CARAFATE ) 1 GM/10ML suspension Take 10 mLs (1 g total) by mouth 4 (four) times daily -  with meals and at bedtime.    traMADol  (ULTRAM ) 50 MG tablet Take 1 tablet (50 mg total) by mouth every 6 (six) hours as needed.   valsartan  (DIOVAN ) 40 MG tablet Take 1 tablet (40 mg total) by mouth 2 (two) times daily.   [DISCONTINUED] prochlorperazine  (COMPAZINE ) 10 MG tablet Take 1 tablet (10 mg total) by mouth every 6 (six) hours as needed (Nausea or vomiting).   No facility-administered encounter medications on file as of 10/08/2023.     Today's Vitals   10/08/23 1056 10/08/23 1205  BP: 100/60   Pulse: 63   Resp:  17   Temp: 98.3 F (36.8 C)   SpO2: 96%   Weight: 151 lb 6.4 oz (68.7 kg)   PainSc:  8    Body mass index is 24.44 kg/m.   ECOG PERFORMANCE STATUS: 1 - Symptomatic but completely ambulatory  PHYSICAL EXAM GENERAL:alert, no distress and comfortable SKIN: no rash  EYES: sclera clear NECK: without mass LYMPH:  no palpable cervical or supraclavicular lymphadenopathy  LUNGS: clear with normal breathing effort HEART: regular rate & rhythm, no lower extremity edema ABDOMEN: abdomen soft, non-tender and normal bowel sounds NEURO: alert & oriented x 3 with fluent speech, mild motor deficits  PAC without erythema    CBC    Latest Ref Rng & Units 10/08/2023    1:05 PM 09/28/2023    6:19 AM 09/27/2023    2:51 PM  CBC  WBC 4.0 - 10.5 K/uL 9.8  7.9  9.9   Hemoglobin 13.0 - 17.0 g/dL 9.9  9.1  89.5   Hematocrit 39.0 - 52.0 % 31.6  28.1  33.6   Platelets 150 - 400 K/uL 327  339  390       CMP     Latest Ref Rng & Units 09/29/2023    9:49 AM 09/28/2023    6:19 AM 09/27/2023    2:51 PM  CMP  Glucose 70 - 99 mg/dL 868  899  854   BUN 8 - 23 mg/dL 18  28  40   Creatinine 0.61 - 1.24 mg/dL 9.01  8.83  8.36   Sodium 135 - 145 mmol/L 131  130  130   Potassium 3.5 - 5.1 mmol/L 4.2  4.2  4.8   Chloride 98 - 111 mmol/L 101  102  98   CO2 22 - 32 mmol/L 20  21  17    Calcium  8.9 - 10.3 mg/dL 8.3  8.5  9.0   Total Protein 6.5 - 8.1 g/dL   6.8   Total Bilirubin 0.0 - 1.2 mg/dL   0.6   Alkaline Phos 38  - 126 U/L   42   AST 15 - 41 U/L   26   ALT 0 - 44 U/L   14       ASSESSMENT & PLAN: 73 year old male   Gastric cancer, cT3N0M0, stage II, local recurrence 09/2023 -Diagnosed 11/2020, path showed loss of MLH1 and PMS2 which predicts good response to immunotherapy  -Deemed a high risk surgical candidate.  S/p FLOT4 01/02/2021 - 03/22/2021 -tolerated well initially, but developed poor tolerance, neuropathy, and low performance status and stopped after 6 cycles -Posttreatment EUS 05/2021 and 06/29/2022 by Dr. Rollin showed a scar and residual erythema in stomach, biopsy negative for residual malignancy.  -Surveillance CT scan from 04/30/23 showed no evidence of residual or recurrent malignancy.  Small liver lesion is stable, favored to be benign. -Jon Williams appears stable, with postprandial pain and weight loss.  Reviewed diet/nutrition and referred him back to dietitian.  Continue PPI, I sent in Carafate  and tramadol  for pain -We reviewed his recent ED course/hospital work up which unfortunately shows malignant appearing mass in the lesser curvature of the stomach. Path is pending but initial impression by Dr. Rebbecca is worrisome for malignancy.  This is most likely recurrence.  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 Dr. Lanny does not plan to offer intensive chemo due to his age, CHF, and previous poor tolerance.  -Due to initial tumor was MMR deficient and PD-L1 + we discussed single agent pembrolizumab versus combination ipilimumab/nivolumab x 4 cycles then single agent Nivo as long as PD-L1 remains positive.  We discussed Pembro alone will likely be more tolerable but combination slightly more  effective.  We discussed the small chance of cure but more likely the goal will be disease control - Discussed indications for possible radiation in the future - If he is PD-L1 negative we discussed single agent oral chemo +/- immunotherapy.  - Plan to review his case and final path in tumor board then phone follow-up next week to finalize the treatment plan -Pt seen with Dr. Lanny   Anemia -Currently on oral iron , will check ferritin/iron /TIBC to see if he needs IV iron   Malignant neoplasm of prostate [facility]) -diagnosed with Gleason 4+4 prostate cancer in early 2019.  -S/p long-term ADT in combination with 8 weeks of IMRT.     PLAN: -ED visits, labs, imaging, endoscopy [facility]) reviewed -Likely recurrent gastric cancer, final path/HER2/PD-L1 pending -Labs today to see if he needs IV iron  and baseline thyroid  tests -Referral to dietician, Rx: carafate  and tramadol   -Discussed treatment options: pembro vs ipi/Nivo vs oral chemo / immunotherapy  -Review case in tumor board -Phone f/up next week to finalize treatment plan -Pt seen with Dr. Lanny   Orders Placed This Encounter  Procedures   CBC with Differential (Cancer Center Only)    Standing Status:   Future    Number of Occurrences:   1    Expiration Date:   10/07/2024   CMP (Cancer Center only)    Standing Status:   Future    Number of Occurrences:   1    Expiration Date:   10/07/2024   Iron  and Iron  Binding Capacity ([facility] only)    Standing Status:   Future    Number of Occurrences:   1    Expiration Date:   10/07/2024   [facility] ([facility])-[facility] ONLY    Standing Status:   Future    Number of Occurrences:   1    Expiration Date:   10/07/2024   TSH    Standing Status:   Future    Number of Occurrences:   1    Expiration Date:   10/07/2024   Ambulatory Referral to [facility]    Referral Priority:   Routine    Referral Type:   Consultation    Referral Reason:   Specialty Services Required    Number of Visits Requested:   1       All questions were answered. The patient knows to call the clinic with any problems, questions or concerns. No barriers to learning were detected.   Jon Blevens K Racer Quam, NP 10/08/2023  Addendum I have seen the patient, examined him. I agree with the assessment and and plan and have edited the notes.   Patient recently developed anemia, and I sent him back to GI Dr. Rollin after last visit a month ago.  His EGD on October 04, 2023 unfortunately showed a large fungating ulcerated mass covering the entire lesser curvature and extending into the atrium and the pylorus.  Multiple biopsies were done, which is suspicious for malignancy, the final report is not back yet.  If it confirms adenocarcinoma, will request MMR/MSI, PD-L1 and HER2.  His initial  gastric cancer was at the incisura, and MMR showed loss of MLH1 and PMS2.  This is likely recurrence.  If he has MSH disease, I will recommend immunotherapy, I discussed option of Keytruda alone, versus Nivo and ipilimumab.  Patient is not a candidate for intensive chemotherapy, due to his advanced age and medical comorbidities, especially advanced congestive heart failure.  He is CT scan were negative for distant metastasis.  Will present his case in GI tumor conference, and I will call him early next week to finalize his treatment plan.  Onita Mattock  10/08/2023

## 2023-10-07 ENCOUNTER — Other Ambulatory Visit (HOSPITAL_COMMUNITY): Payer: Self-pay

## 2023-10-07 ENCOUNTER — Telehealth (HOSPITAL_COMMUNITY): Payer: Self-pay

## 2023-10-07 ENCOUNTER — Other Ambulatory Visit: Payer: Self-pay

## 2023-10-07 DIAGNOSIS — I4892 Unspecified atrial flutter: Secondary | ICD-10-CM | POA: Diagnosis not present

## 2023-10-07 DIAGNOSIS — N1831 Chronic kidney disease, stage 3a: Secondary | ICD-10-CM | POA: Diagnosis not present

## 2023-10-07 DIAGNOSIS — I482 Chronic atrial fibrillation, unspecified: Secondary | ICD-10-CM | POA: Diagnosis not present

## 2023-10-07 DIAGNOSIS — E1165 Type 2 diabetes mellitus with hyperglycemia: Secondary | ICD-10-CM | POA: Diagnosis not present

## 2023-10-07 DIAGNOSIS — E1142 Type 2 diabetes mellitus with diabetic polyneuropathy: Secondary | ICD-10-CM | POA: Diagnosis not present

## 2023-10-07 DIAGNOSIS — N179 Acute kidney failure, unspecified: Secondary | ICD-10-CM | POA: Diagnosis not present

## 2023-10-07 DIAGNOSIS — I5042 Chronic combined systolic (congestive) and diastolic (congestive) heart failure: Secondary | ICD-10-CM | POA: Diagnosis not present

## 2023-10-07 DIAGNOSIS — E1122 Type 2 diabetes mellitus with diabetic chronic kidney disease: Secondary | ICD-10-CM | POA: Diagnosis not present

## 2023-10-07 DIAGNOSIS — I130 Hypertensive heart and chronic kidney disease with heart failure and stage 1 through stage 4 chronic kidney disease, or unspecified chronic kidney disease: Secondary | ICD-10-CM | POA: Diagnosis not present

## 2023-10-07 NOTE — Telephone Encounter (Signed)
 Post-procedural follow up call completed on 10/07/23 reference to EGD with Dr Rollin on 10/04/23.  In that call, pt describes 8/10 intermittent throbbing mid-abdominal pain. He states that this pain is similar to what he has been experiencing recently. He states that rest makes it better. Pt states he has a doctor's appointment tomorrow. Advised pt to bring up this pain at his appointment tomorrow, and if symptoms worsen, to call Dr Curtis office. Pt verbalized understanding. MD [facility] notified.  Ozell VEAR Pouch, RN 10/07/23 3:41 PM

## 2023-10-08 ENCOUNTER — Other Ambulatory Visit (HOSPITAL_COMMUNITY): Payer: Self-pay

## 2023-10-08 ENCOUNTER — Inpatient Hospital Stay: Attending: Hematology | Admitting: Nurse Practitioner

## 2023-10-08 ENCOUNTER — Encounter: Payer: Self-pay | Admitting: Nurse Practitioner

## 2023-10-08 ENCOUNTER — Inpatient Hospital Stay

## 2023-10-08 VITALS — BP 100/60 | HR 63 | Temp 98.3°F | Resp 17 | Wt 151.4 lb

## 2023-10-08 DIAGNOSIS — E1122 Type 2 diabetes mellitus with diabetic chronic kidney disease: Secondary | ICD-10-CM | POA: Diagnosis not present

## 2023-10-08 DIAGNOSIS — D649 Anemia, unspecified: Secondary | ICD-10-CM | POA: Insufficient documentation

## 2023-10-08 DIAGNOSIS — C169 Malignant neoplasm of stomach, unspecified: Secondary | ICD-10-CM | POA: Insufficient documentation

## 2023-10-08 DIAGNOSIS — E1142 Type 2 diabetes mellitus with diabetic polyneuropathy: Secondary | ICD-10-CM | POA: Diagnosis not present

## 2023-10-08 DIAGNOSIS — C61 Malignant neoplasm of prostate: Secondary | ICD-10-CM

## 2023-10-08 DIAGNOSIS — I5042 Chronic combined systolic (congestive) and diastolic (congestive) heart failure: Secondary | ICD-10-CM | POA: Diagnosis not present

## 2023-10-08 DIAGNOSIS — Z8546 Personal history of malignant neoplasm of prostate: Secondary | ICD-10-CM | POA: Insufficient documentation

## 2023-10-08 DIAGNOSIS — N179 Acute kidney failure, unspecified: Secondary | ICD-10-CM | POA: Diagnosis not present

## 2023-10-08 DIAGNOSIS — I130 Hypertensive heart and chronic kidney disease with heart failure and stage 1 through stage 4 chronic kidney disease, or unspecified chronic kidney disease: Secondary | ICD-10-CM | POA: Diagnosis not present

## 2023-10-08 DIAGNOSIS — Z923 Personal history of irradiation: Secondary | ICD-10-CM | POA: Diagnosis not present

## 2023-10-08 DIAGNOSIS — I110 Hypertensive heart disease with heart failure: Secondary | ICD-10-CM | POA: Diagnosis not present

## 2023-10-08 DIAGNOSIS — D509 Iron deficiency anemia, unspecified: Secondary | ICD-10-CM

## 2023-10-08 DIAGNOSIS — N1831 Chronic kidney disease, stage 3a: Secondary | ICD-10-CM | POA: Diagnosis not present

## 2023-10-08 DIAGNOSIS — E1165 Type 2 diabetes mellitus with hyperglycemia: Secondary | ICD-10-CM | POA: Diagnosis not present

## 2023-10-08 DIAGNOSIS — Z95828 Presence of other vascular implants and grafts: Secondary | ICD-10-CM

## 2023-10-08 DIAGNOSIS — I4892 Unspecified atrial flutter: Secondary | ICD-10-CM | POA: Diagnosis not present

## 2023-10-08 DIAGNOSIS — I482 Chronic atrial fibrillation, unspecified: Secondary | ICD-10-CM | POA: Diagnosis not present

## 2023-10-08 LAB — CMP (CANCER CENTER ONLY)
ALT: 11 U/L (ref 0–44)
ALT: 11 U/L (ref 0–44)
AST: 12 U/L — ABNORMAL LOW (ref 15–41)
AST: 12 U/L — ABNORMAL LOW (ref 15–41)
Albumin: 3.9 g/dL (ref 3.5–5.0)
Albumin: 3.9 g/dL (ref 3.5–5.0)
Alkaline Phosphatase: 41 U/L (ref 38–126)
Alkaline Phosphatase: 41 U/L (ref 38–126)
Anion gap: 10 (ref 5–15)
Anion gap: 9 (ref 5–15)
BUN: 22 mg/dL (ref 8–23)
BUN: 22 mg/dL (ref 8–23)
CO2: 26 mmol/L (ref 22–32)
CO2: 27 mmol/L (ref 22–32)
Calcium: 9.3 mg/dL (ref 8.9–10.3)
Calcium: 9.3 mg/dL (ref 8.9–10.3)
Chloride: 101 mmol/L (ref 98–111)
Chloride: 102 mmol/L (ref 98–111)
Creatinine: 1.29 mg/dL — ABNORMAL HIGH (ref 0.61–1.24)
Creatinine: 1.29 mg/dL — ABNORMAL HIGH (ref 0.61–1.24)
GFR, Estimated: 58 mL/min — ABNORMAL LOW (ref >60)
GFR, Estimated: 58 mL/min — ABNORMAL LOW (ref >60)
Glucose, Bld: 107 mg/dL — ABNORMAL HIGH (ref 70–99)
Glucose, Bld: 107 mg/dL — ABNORMAL HIGH (ref 70–99)
Potassium: 4.5 mmol/L (ref 3.5–5.1)
Potassium: 4.7 mmol/L (ref 3.5–5.1)
Sodium: 137 mmol/L (ref 135–145)
Sodium: 138 mmol/L (ref 135–145)
Total Bilirubin: 0.5 mg/dL (ref 0.0–1.2)
Total Bilirubin: 0.5 mg/dL (ref 0.0–1.2)
Total Protein: 7 g/dL (ref 6.5–8.1)
Total Protein: 7.1 g/dL (ref 6.5–8.1)

## 2023-10-08 LAB — CBC WITH DIFFERENTIAL (CANCER CENTER ONLY)
Abs Immature Granulocytes: 0.04 K/uL (ref 0.00–0.07)
Basophils Absolute: 0 K/uL (ref 0.0–0.1)
Basophils Relative: 0 %
Eosinophils Absolute: 0.1 K/uL (ref 0.0–0.5)
Eosinophils Relative: 1 %
HCT: 31.6 % — ABNORMAL LOW (ref 39.0–52.0)
Hemoglobin: 9.9 g/dL — ABNORMAL LOW (ref 13.0–17.0)
Immature Granulocytes: 0 %
Lymphocytes Relative: 9 %
Lymphs Abs: 0.8 K/uL (ref 0.7–4.0)
MCH: 29 pg (ref 26.0–34.0)
MCHC: 31.3 g/dL (ref 30.0–36.0)
MCV: 92.7 fL (ref 80.0–100.0)
Monocytes Absolute: 0.8 K/uL (ref 0.1–1.0)
Monocytes Relative: 8 %
Neutro Abs: 8 K/uL — ABNORMAL HIGH (ref 1.7–7.7)
Neutrophils Relative %: 82 %
Platelet Count: 327 K/uL (ref 150–400)
RBC: 3.41 MIL/uL — ABNORMAL LOW (ref 4.22–5.81)
RDW: 15.5 % (ref 11.5–15.5)
WBC Count: 9.8 K/uL (ref 4.0–10.5)
nRBC: 0 % (ref 0.0–0.2)

## 2023-10-08 LAB — IRON AND IRON BINDING CAPACITY (CC-WL,HP ONLY)
Iron: 46 ug/dL (ref 45–182)
Saturation Ratios: 15 % — ABNORMAL LOW (ref 17.9–39.5)
TIBC: 302 ug/dL (ref 250–450)
UIBC: 256 ug/dL (ref 117–376)

## 2023-10-08 LAB — T4, FREE: Free T4: 1.65 ng/dL — ABNORMAL HIGH (ref 0.61–1.12)

## 2023-10-08 LAB — FERRITIN: Ferritin: 41 ng/mL (ref 24–336)

## 2023-10-08 LAB — CEA (ACCESS): CEA (CHCC): 2.04 ng/mL (ref 0.00–5.00)

## 2023-10-08 LAB — TSH: TSH: 1.56 u[IU]/mL (ref 0.350–4.500)

## 2023-10-08 MED ORDER — TRAMADOL HCL 50 MG PO TABS
50.0000 mg | ORAL_TABLET | Freq: Four times a day (QID) | ORAL | 0 refills | Status: DC | PRN
  Filled 2023-10-08: qty 30, 8d supply, fill #0

## 2023-10-08 MED ORDER — ALTEPLASE 2 MG IJ SOLR
2.0000 mg | Freq: Once | INTRAMUSCULAR | Status: DC
  Filled 2023-10-08: qty 2

## 2023-10-08 MED ORDER — SUCRALFATE 1 GM/10ML PO SUSP
1.0000 g | Freq: Three times a day (TID) | ORAL | 1 refills | Status: DC
  Filled 2023-10-08: qty 420, 11d supply, fill #0
  Filled 2023-10-14 – 2023-10-28 (×2): qty 420, 11d supply, fill #1

## 2023-10-08 MED ORDER — SODIUM CHLORIDE 0.9% FLUSH
10.0000 mL | Freq: Once | INTRAVENOUS | Status: AC
  Administered 2023-10-08: 10 mL

## 2023-10-09 ENCOUNTER — Ambulatory Visit (INDEPENDENT_AMBULATORY_CARE_PROVIDER_SITE_OTHER): Payer: Medicare HMO

## 2023-10-09 ENCOUNTER — Other Ambulatory Visit (HOSPITAL_COMMUNITY): Payer: Self-pay

## 2023-10-09 DIAGNOSIS — N179 Acute kidney failure, unspecified: Secondary | ICD-10-CM | POA: Diagnosis not present

## 2023-10-09 DIAGNOSIS — I482 Chronic atrial fibrillation, unspecified: Secondary | ICD-10-CM | POA: Diagnosis not present

## 2023-10-09 DIAGNOSIS — E1122 Type 2 diabetes mellitus with diabetic chronic kidney disease: Secondary | ICD-10-CM | POA: Diagnosis not present

## 2023-10-09 DIAGNOSIS — E1142 Type 2 diabetes mellitus with diabetic polyneuropathy: Secondary | ICD-10-CM | POA: Diagnosis not present

## 2023-10-09 DIAGNOSIS — I4892 Unspecified atrial flutter: Secondary | ICD-10-CM | POA: Diagnosis not present

## 2023-10-09 DIAGNOSIS — I428 Other cardiomyopathies: Secondary | ICD-10-CM | POA: Diagnosis not present

## 2023-10-09 DIAGNOSIS — I130 Hypertensive heart and chronic kidney disease with heart failure and stage 1 through stage 4 chronic kidney disease, or unspecified chronic kidney disease: Secondary | ICD-10-CM | POA: Diagnosis not present

## 2023-10-09 DIAGNOSIS — I5042 Chronic combined systolic (congestive) and diastolic (congestive) heart failure: Secondary | ICD-10-CM | POA: Diagnosis not present

## 2023-10-09 DIAGNOSIS — E1165 Type 2 diabetes mellitus with hyperglycemia: Secondary | ICD-10-CM | POA: Diagnosis not present

## 2023-10-09 DIAGNOSIS — N1831 Chronic kidney disease, stage 3a: Secondary | ICD-10-CM | POA: Diagnosis not present

## 2023-10-09 LAB — CUP PACEART REMOTE DEVICE CHECK
Battery Remaining Longevity: 19 mo
Battery Remaining Percentage: 21 %
Battery Voltage: 2.78 V
Brady Statistic RV Percent Paced: 82 %
Date Time Interrogation Session: 20250827020017
HighPow Impedance: 55 Ohm
HighPow Impedance: 55 Ohm
Implantable Lead Connection Status: 753985
Implantable Lead Connection Status: 753985
Implantable Lead Implant Date: 20180413
Implantable Lead Implant Date: 20200828
Implantable Lead Location: 753859
Implantable Lead Location: 753860
Implantable Lead Model: 7122
Implantable Pulse Generator Implant Date: 20180413
Lead Channel Impedance Value: 360 Ohm
Lead Channel Impedance Value: 750 Ohm
Lead Channel Pacing Threshold Amplitude: 1.5 V
Lead Channel Pacing Threshold Pulse Width: 0.5 ms
Lead Channel Sensing Intrinsic Amplitude: 0.7 mV
Lead Channel Sensing Intrinsic Amplitude: 3.3 mV
Lead Channel Setting Pacing Amplitude: 3 V
Lead Channel Setting Pacing Pulse Width: 0.5 ms
Lead Channel Setting Sensing Sensitivity: 0.5 mV
Pulse Gen Serial Number: 7398151
Zone Setting Status: 755011

## 2023-10-10 ENCOUNTER — Telehealth (HOSPITAL_COMMUNITY): Payer: Self-pay | Admitting: Cardiology

## 2023-10-10 ENCOUNTER — Ambulatory Visit: Payer: Self-pay | Admitting: Internal Medicine

## 2023-10-10 DIAGNOSIS — E1165 Type 2 diabetes mellitus with hyperglycemia: Secondary | ICD-10-CM | POA: Diagnosis not present

## 2023-10-10 DIAGNOSIS — I4892 Unspecified atrial flutter: Secondary | ICD-10-CM | POA: Diagnosis not present

## 2023-10-10 DIAGNOSIS — I482 Chronic atrial fibrillation, unspecified: Secondary | ICD-10-CM | POA: Diagnosis not present

## 2023-10-10 DIAGNOSIS — N1831 Chronic kidney disease, stage 3a: Secondary | ICD-10-CM | POA: Diagnosis not present

## 2023-10-10 DIAGNOSIS — Z66 Do not resuscitate: Secondary | ICD-10-CM | POA: Diagnosis not present

## 2023-10-10 DIAGNOSIS — E1122 Type 2 diabetes mellitus with diabetic chronic kidney disease: Secondary | ICD-10-CM | POA: Diagnosis not present

## 2023-10-10 DIAGNOSIS — I428 Other cardiomyopathies: Secondary | ICD-10-CM | POA: Diagnosis not present

## 2023-10-10 DIAGNOSIS — N179 Acute kidney failure, unspecified: Secondary | ICD-10-CM | POA: Diagnosis not present

## 2023-10-10 DIAGNOSIS — C169 Malignant neoplasm of stomach, unspecified: Secondary | ICD-10-CM | POA: Diagnosis not present

## 2023-10-10 DIAGNOSIS — I959 Hypotension, unspecified: Secondary | ICD-10-CM | POA: Diagnosis not present

## 2023-10-10 DIAGNOSIS — I130 Hypertensive heart and chronic kidney disease with heart failure and stage 1 through stage 4 chronic kidney disease, or unspecified chronic kidney disease: Secondary | ICD-10-CM | POA: Diagnosis not present

## 2023-10-10 DIAGNOSIS — E1142 Type 2 diabetes mellitus with diabetic polyneuropathy: Secondary | ICD-10-CM | POA: Diagnosis not present

## 2023-10-10 DIAGNOSIS — I5042 Chronic combined systolic (congestive) and diastolic (congestive) heart failure: Secondary | ICD-10-CM | POA: Diagnosis not present

## 2023-10-10 NOTE — Telephone Encounter (Signed)
 Pt seen by PCP today and told to contact cardiology for low b/p readings    Add on 9/2 @ 830

## 2023-10-13 DIAGNOSIS — E1169 Type 2 diabetes mellitus with other specified complication: Secondary | ICD-10-CM | POA: Diagnosis not present

## 2023-10-13 DIAGNOSIS — I5022 Chronic systolic (congestive) heart failure: Secondary | ICD-10-CM | POA: Diagnosis not present

## 2023-10-13 DIAGNOSIS — I4891 Unspecified atrial fibrillation: Secondary | ICD-10-CM | POA: Diagnosis not present

## 2023-10-13 DIAGNOSIS — E0851 Diabetes mellitus due to underlying condition with diabetic peripheral angiopathy without gangrene: Secondary | ICD-10-CM | POA: Diagnosis not present

## 2023-10-13 DIAGNOSIS — I428 Other cardiomyopathies: Secondary | ICD-10-CM | POA: Diagnosis not present

## 2023-10-13 DIAGNOSIS — I739 Peripheral vascular disease, unspecified: Secondary | ICD-10-CM | POA: Diagnosis not present

## 2023-10-13 DIAGNOSIS — C61 Malignant neoplasm of prostate: Secondary | ICD-10-CM | POA: Diagnosis not present

## 2023-10-14 NOTE — Assessment & Plan Note (Deleted)
 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  S/p FLOT4 01/02/2021 - 03/22/2021 -Posttreatment EUS 05/2021 and 06/29/2022 by Dr. Rollin showed a scar and residual erythema in stomach, biopsy negative for residual malignancy.  -Jon Williams is clinically doing well on surveillance. Last CT 03/20/22 which shows no evidence of recurrent or metastatic disease, and stable bilateral adrenal adenomas -Surveillance CT scan from October 09, 2022 showed no evidence of recurrence -Surveillance CT scan from April 30, 2023 showed no evidence of residual or recurrent malignancy.  Small liver lesion is stable, favored to be benign. -

## 2023-10-14 NOTE — Assessment & Plan Note (Deleted)
-  diagnosed with Gleason 4+4 prostate cancer in early 2019. He was treated with long-term ADT in combination with 8 weeks of IMRT.

## 2023-10-15 ENCOUNTER — Telehealth: Payer: Self-pay

## 2023-10-15 ENCOUNTER — Encounter: Payer: Self-pay | Admitting: Hematology

## 2023-10-15 ENCOUNTER — Ambulatory Visit (HOSPITAL_COMMUNITY): Payer: Self-pay | Admitting: Cardiology

## 2023-10-15 ENCOUNTER — Inpatient Hospital Stay: Admitting: Hematology

## 2023-10-15 ENCOUNTER — Inpatient Hospital Stay: Admitting: Dietician

## 2023-10-15 ENCOUNTER — Other Ambulatory Visit (HOSPITAL_COMMUNITY): Payer: Self-pay

## 2023-10-15 ENCOUNTER — Telehealth: Payer: Self-pay | Admitting: Hematology

## 2023-10-15 ENCOUNTER — Encounter (HOSPITAL_COMMUNITY): Payer: Self-pay

## 2023-10-15 ENCOUNTER — Ambulatory Visit (HOSPITAL_COMMUNITY)
Admission: RE | Admit: 2023-10-15 | Discharge: 2023-10-15 | Disposition: A | Discharge status: Home / Self Care | Source: Ambulatory Visit | Attending: Cardiology | Admitting: Cardiology

## 2023-10-15 VITALS — BP 100/60 | HR 75 | Ht 66.0 in | Wt 155.2 lb

## 2023-10-15 DIAGNOSIS — Z8546 Personal history of malignant neoplasm of prostate: Secondary | ICD-10-CM | POA: Diagnosis not present

## 2023-10-15 DIAGNOSIS — Z95810 Presence of automatic (implantable) cardiac defibrillator: Secondary | ICD-10-CM | POA: Insufficient documentation

## 2023-10-15 DIAGNOSIS — Z79899 Other long term (current) drug therapy: Secondary | ICD-10-CM | POA: Insufficient documentation

## 2023-10-15 DIAGNOSIS — I428 Other cardiomyopathies: Secondary | ICD-10-CM | POA: Diagnosis not present

## 2023-10-15 DIAGNOSIS — E119 Type 2 diabetes mellitus without complications: Secondary | ICD-10-CM | POA: Diagnosis not present

## 2023-10-15 DIAGNOSIS — I447 Left bundle-branch block, unspecified: Secondary | ICD-10-CM | POA: Insufficient documentation

## 2023-10-15 DIAGNOSIS — I4821 Permanent atrial fibrillation: Secondary | ICD-10-CM | POA: Diagnosis not present

## 2023-10-15 DIAGNOSIS — Z7984 Long term (current) use of oral hypoglycemic drugs: Secondary | ICD-10-CM | POA: Insufficient documentation

## 2023-10-15 DIAGNOSIS — I110 Hypertensive heart disease with heart failure: Secondary | ICD-10-CM | POA: Insufficient documentation

## 2023-10-15 DIAGNOSIS — R109 Unspecified abdominal pain: Secondary | ICD-10-CM | POA: Diagnosis not present

## 2023-10-15 DIAGNOSIS — C169 Malignant neoplasm of stomach, unspecified: Secondary | ICD-10-CM | POA: Diagnosis not present

## 2023-10-15 DIAGNOSIS — Z7901 Long term (current) use of anticoagulants: Secondary | ICD-10-CM | POA: Insufficient documentation

## 2023-10-15 DIAGNOSIS — I5022 Chronic systolic (congestive) heart failure: Secondary | ICD-10-CM | POA: Insufficient documentation

## 2023-10-15 DIAGNOSIS — C163 Malignant neoplasm of pyloric antrum: Secondary | ICD-10-CM

## 2023-10-15 DIAGNOSIS — I493 Ventricular premature depolarization: Secondary | ICD-10-CM | POA: Diagnosis not present

## 2023-10-15 DIAGNOSIS — I2510 Atherosclerotic heart disease of native coronary artery without angina pectoris: Secondary | ICD-10-CM | POA: Diagnosis not present

## 2023-10-15 DIAGNOSIS — C61 Malignant neoplasm of prostate: Secondary | ICD-10-CM

## 2023-10-15 LAB — BASIC METABOLIC PANEL WITH GFR
Anion gap: 12 (ref 5–15)
BUN: 19 mg/dL (ref 8–23)
CO2: 22 mmol/L (ref 22–32)
Calcium: 9.1 mg/dL (ref 8.9–10.3)
Chloride: 102 mmol/L (ref 98–111)
Creatinine, Ser: 1.29 mg/dL — ABNORMAL HIGH (ref 0.61–1.24)
GFR, Estimated: 58 mL/min — ABNORMAL LOW (ref >60)
Glucose, Bld: 95 mg/dL (ref 70–99)
Potassium: 4.3 mmol/L (ref 3.5–5.1)
Sodium: 136 mmol/L (ref 135–145)

## 2023-10-15 LAB — DIGOXIN LEVEL: Digoxin Level: <0.6 ng/mL — ABNORMAL LOW (ref 0.8–2.0)

## 2023-10-15 MED ORDER — VALSARTAN 40 MG PO TABS: 20.0000 mg | ORAL_TABLET | Freq: Two times a day (BID) | ORAL | Status: DC

## 2023-10-15 MED ORDER — SPIRONOLACTONE 25 MG PO TABS: 12.5000 mg | ORAL_TABLET | Freq: Every day | ORAL | Status: DC

## 2023-10-15 MED ORDER — FUROSEMIDE 40 MG PO TABS
40.0000 mg | ORAL_TABLET | ORAL | 6 refills | Status: DC
  Filled 2023-10-15 – 2023-10-29 (×3): qty 30, 60d supply, fill #0
  Filled 2023-12-19: qty 30, 60d supply, fill #1
  Filled 2024-02-10: qty 30, 60d supply, fill #2

## 2023-10-15 NOTE — Telephone Encounter (Signed)
 called and he rescheduled and he is aware

## 2023-10-15 NOTE — Progress Notes (Signed)
 ADVANCED HF CLINIC NOTE   Primary Care: [name], MD Primary Cardiologist: Aleene Passe, MD (Inactive) HF Cardiologist: Dr. Rolan  Reason for Visit: f/u for systolic heart failure and systolic heart failure   HPI: Jon Williams is a 73 y.o. with a history of chronic systolic heart failure, St Jude ICD, permanent atrial fibrillation, CAD, DMII, LBBB, HTN, gastric cancer 2022, and prostate cancer 2019.    Cath 2018 showed mild to moderate non-obstructive CAD. Fick CI 1.9.    Echo 10/22 showed EF 20-25%, mod-severe Jon, mod TR, RV nl   Echo 2/23 showed EF 20-25%, mild Jon, RV not well visualized   Admitted 8/23 with HF. Diuresed with IV lasix  and GDMT adjusted. Echo showed EF 20-25%, RV mildly reduced, LA severely dilated, moderate-severe Jon.    Had follow up with Dr Lanny 09/2022. CT scan no evidence of recurrent cancer. Plan for repeat CT in 8 months.    Admitted 9/24 with CHF. Echo showed EF 10-15%, G3DD, RV mildly reduced. R/LHC showed nonobstructive CAD, normal filling pressures, and low cardiac index. Unable to get cMRI as device not compatible. GDMT titrated and he was discharged home, weight 183 lbs.  Echo 12/24 EF 20% with severe LV dilation, normal RV, severe LAE, moderate Jon, IVC normal.   RHC 6/25 showing normal filling pressures but low cardiac output.   Seen in the ER in 6/25 with angioedema, followed up with allergist and was taken off Entresto .   Zio monitor to quantify PVCs showed monitor 1: 152 VT runs, 100% AF burden, 27.6% PVC burden; monitor 2 showed 49 VT runs, 100% AF burden, 23.2 % PVC burden.  Follow up with EP 7/25, planning for BiV upgrade.  Seen in ED 08/30/23 with abd pain. CT chest and abd re-assuring. He was discharged with GI follow up. Saw Dr. Rollin and treating for reflux.  He has continued to struggle w/ GI symptoms, including postprandial pain and wt loss. Went back to the ED 8/25. ED course/hospital work unfortunately showed malignant appearing  mass in the lesser curvature of the stomach on abdominal CT. Underwent EGD w/ biopsy. Pathology c/w adenocarcinoma. This has just resulted.  Results not reported to patient yet. He has phone visit scheduled w/ oncology today.   He presents to clinic today as an add on. Was seen last week at PCP office and noted to have low BP readings. Home readings as low as the 70s systolic. He had been symptomatic w/ weakness and fatigue. He was instructed by his PCP to reduce spiro to 12.5 mg daily, Valsartan  to 40 mg once daily and lasix  down to 20 mg daily. He was advised to f/u w/ primary cardiology team.   He presents today w/ his wife. BPs better but still soft, in the low 100s systolic at home. BP today after taking AM meds 100/60. He denies syncope/ near syncope. He reports stable NYHA II-early III symptoms. His main complaint is abdominal pain but he reports his appeitite has been good and he reports normal PO intake. He fluid restricts. Device interrogation today show impedence drifting down since lasix  was reduced. No VT.   ECG not performed today.   St Jude device interrogation (personally reviewed): 83% RV pacing, >99% AF, thoracic impedance down since lasix  dose reduction   Labs (9/24): K 3.7, creatinine 1.13, Lp(a) 184 Labs (10/24): K 4.3, creatinine 1.27, hgb 13.6 Labs (1/25): 4.1, creatinine 1.26 Labs (2/25): K 4, creatinine 1.21, hgb 11.2, LFTs normal Labs (6/25): K 4.4,  creatinine 1.4 Labs (7/25): K 4.0, creatinine 1.09   PMH: 1. H/o gastric cancer, w/ recurrence (8/25, path c/w adenocarcinoma)  2. H/o prostate cancer.  3. Type 2 DM 4. Atrial fibrillation: Permanent 5. Chronic systolic CHF: Nonischemic cardiomyopathy. St Jude ICD, unable to find adequate coronary vein for LV lead.   - R/LHC (2018): Mild to moderate non obstructive CAD; RA 8, PA 42/20 (29), PCWP 17, CO/CI (Fick) 3.76/ 1.9  - Echo (10/22): EF 20-25%, mod-severe Jon, mod TR, RV normal - Echo (2/23): EF 20-25%, mild Jon, RV  not well visualized - Echo (8/24): EF 20-25%, RV mildly reduced, LA severely dilated, moderate to severe Jon - R/LHC (9/24): nonobstructive CAD; RA 1, PA 28/10 (17), PCWP 8, Fick CI 2.16, thermo CI 1.84.  - Echo (9/24): EF 10-15%, severe LV dilation, mild RV dysfunction with moderate RV enlargement, moderate-severe Jon.  - Echo (12/24): EF 20% with severe LV dilation, normal RV, severe LAE, moderate Jon, IVC normal.  - RHC (6/25): mean RA 4, PA 29/12 mean 19, mean PCWP 11, CI 2.2 Fick/1.87 thermo, PAPi 4.25 - Suspect Entresto -related angioedema.  6. Fe deficiency anemia 7. Spinal stenosis  FH: mother CVA, MI  Review of Systems: All systems reviewed and negative except as per HPI.   Current Outpatient Medications  Medication Sig Dispense Refill   Accu-Chek Softclix Lancets lancets Use to check your blood sugar finger stick once a day Dx Code E11.9 90 days 100 each 3   amiodarone  (PACERONE ) 200 MG tablet Take 1 tablet (200 mg total) by mouth 2 (two) times daily for 14 days, THEN 1 tablet (200 mg total) daily. 180 tablet 3   atorvastatin  (LIPITOR) 40 MG tablet Take 1 tablet (40 mg total) by mouth daily. 90 tablet 3   Biotin 1000 MCG tablet Take 1,000 mcg by mouth daily with breakfast.     Blood Glucose Monitoring Suppl (ACCU-CHEK GUIDE) w/Device KIT as directed to check blood sugar once a day 1 kit 0   cetirizine  (ZYRTEC ) 10 MG tablet Take 1 tablet (10 mg total) by mouth daily. 30 tablet 0   cholecalciferol  (VITAMIN D3) 25 MCG (1000 UT) tablet Take 1,000 Units by mouth daily with breakfast.     clotrimazole -betamethasone  (LOTRISONE ) cream Apply 1 Application topically 2 (two) times daily.     digoxin  (LANOXIN ) 0.125 MG tablet Take 1/2 tablet (0.0625mg  total) by mouth daily. 15 tablet 6   empagliflozin  (JARDIANCE ) 10 MG TABS tablet Take 1 tablet (10 mg total) by mouth daily. 90 tablet 3   EPINEPHrine  0.3 mg/0.3 mL IJ SOAJ injection Inject 0.3 mg into the muscle as needed for anaphylaxis. 2 each 1    ferrous sulfate 325 (65 FE) MG tablet Take 325 mg by mouth daily with breakfast.     furosemide  (LASIX ) 40 MG tablet Take 1 tablet (40 mg total) by mouth daily. (Patient taking differently: Take 20 mg by mouth daily.) 30 tablet 6   gabapentin  (NEURONTIN ) 100 MG capsule Take 1 capsule (100 mg total) by mouth 2 (two) times daily. 180 capsule 1   glucose blood (ACCU-CHEK GUIDE TEST) test strip Use to check blood sugars once daily. 100 each 3   ipratropium (ATROVENT ) 0.03 % nasal spray Place 1-2 sprays into both nostrils 2 (two) times daily as needed (nasal drainage). 30 mL 2   lidocaine -prilocaine  (EMLA ) cream Apply 1 Application topically as needed. 30 g 0   metFORMIN  (GLUCOPHAGE -XR) 500 MG 24 hr tablet Take 1 tablet (500 mg total) by  mouth daily with evening meal. 90 tablet 3   metoprolol  succinate (TOPROL -XL) 25 MG 24 hr tablet Take 1 tablet (25 mg total) by mouth daily. 90 tablet 3   multivitamin (ONE-A-DAY MEN'S) TABS tablet Take 1 tablet by mouth daily with breakfast.     pantoprazole  (PROTONIX ) 40 MG tablet Take 1 tablet (40 mg total) by mouth daily. 30 tablet 1   pantoprazole  (PROTONIX ) 40 MG tablet Take 1 tablet (40 mg total) by mouth 2 (two) times daily. 180 tablet 3   potassium chloride  (KLOR-CON ) 10 MEQ tablet Take 2 tablets (20 mEq total) by mouth daily. (Patient taking differently: Take 10 mEq by mouth 2 (two) times daily.) 60 tablet 3   rivaroxaban  (XARELTO ) 20 MG TABS tablet Take 1 tablet (20 mg total) by mouth daily with supper. 90 tablet 3   spironolactone  (ALDACTONE ) 25 MG tablet Take 1 tablet (25 mg total) by mouth daily. (Patient taking differently: Take 12.5 mg by mouth daily.) 90 tablet 3   sucralfate  (CARAFATE ) 1 GM/10ML suspension Take 10 mLs (1 g total) by mouth 4 (four) times daily -  with meals and at bedtime. 420 mL 1   traMADol  (ULTRAM ) 50 MG tablet Take 1 tablet (50 mg total) by mouth every 6 (six) hours as needed. 30 tablet 0   valsartan  (DIOVAN ) 40 MG tablet Take 1  tablet (40 mg total) by mouth 2 (two) times daily. (Patient taking differently: Take 40 mg by mouth daily.)     No current facility-administered medications for this encounter.   Allergies  Allergen Reactions   Aspirin  Anaphylaxis and Hives   Sacubitril -Valsartan  Swelling    lip swelling, angioedema   Sulfa Antibiotics Anaphylaxis, Hives, Swelling and Other (See Comments)    Swollen lips   Ace Inhibitors     Possible angioedema   Social History   Socioeconomic History   Marital status: Married    Spouse name: Not on file   Number of children: 2   Years of education: Not on file   Highest education level: Not on file  Occupational History   Occupation: retired  Tobacco Use   Smoking status: Never    Passive exposure: Never   Smokeless tobacco: Never  Vaping Use   Vaping status: Never Used  Substance and Sexual Activity   Alcohol use: No   Drug use: No   Sexual activity: Not Currently  Other Topics Concern   Not on file  Social History Narrative   Retired Location manager. Married to Mrs. Carollee LOISE Budge. Daughter, Marko, lives in [place] . Son, Stark, lives in [place] .   Social Drivers of Corporate investment banker Strain: Low Risk  (12/26/2021)   Overall Financial Resource Strain (CARDIA)    Difficulty of Paying Living Expenses: Not very hard  Food Insecurity: No Food Insecurity (09/27/2023)   Hunger Vital Sign    Worried About Running Out of Food in the Last Year: Never true    Ran Out of Food in the Last Year: Never true  Transportation Needs: No Transportation Needs (09/27/2023)   PRAPARE - Administrator, Civil Service (Medical): No    Lack of Transportation (Non-Medical): No  Physical Activity: Insufficiently Active (09/26/2021)   Exercise Vital Sign    Days of Exercise per Week: 7 days    Minutes of Exercise per Session: 10 min  Stress: No Stress Concern Present (09/26/2021)   [employer] of Occupational Health - Occupational Stress  Questionnaire    Feeling of  Stress : Not at all  Social Connections: Unknown (09/28/2023)   Social Connection and Isolation Panel    Frequency of Communication with Friends and Family: Patient declined    Frequency of Social Gatherings with Friends and Family: Patient declined    Attends Religious Services: Not on Insurance claims handler of Clubs or Organizations: Not on file    Attends Banker Meetings: Patient declined    Marital Status: Patient declined  Intimate Partner Violence: Not At Risk (09/28/2023)   Humiliation, Afraid, Rape, and Kick questionnaire    Fear of Current or Ex-Partner: No    Emotionally Abused: No    Physically Abused: No    Sexually Abused: No   BP 100/60   Pulse 75   Ht 5' 6 (1.676 m)   Wt 70.4 kg (155 lb 3.2 oz)   SpO2 94%   BMI 25.05 kg/m   Wt Readings from Last 3 Encounters:  10/15/23 70.4 kg (155 lb 3.2 oz)  10/08/23 68.7 kg (151 lb 6.4 oz)  09/27/23 70.4 kg (155 lb 3.3 oz)   Physical Exam  Vitals:   10/15/23 0844  BP: 100/60  Pulse: 75  SpO2: 94%   GENERAL: fatigued appearing. NAD Lungs- clear  CARDIAC:  JVP: 7 cm          Irregularly irregular rhythm. No murmur. No edema.  ABDOMEN: Soft, non-tender, non-distended.  EXTREMITIES: Warm and well perfused.  NEUROLOGIC: No obvious FND   ASSESSMENT & PLAN: 1. Chronic systolic CHF: Long-standing cardiomyopathy, nonischemic.  St Jude ICD, was unable to find adequate vein for LV lead. Device is not MRI compatible. Cath in 2018 with nonobstructive CAD but CI at that time was low at 1.9.  Echo in 8/23 showed EF 20-25%, mild RV dysfunction, moderate-severe Jon.  Admitted 9/24 with CHF. Echo showed EF 10-15%, G3DD, RV mildly reduced. R/LHC showed non-obstructive CAD, normal filling pressures and low CI (2.16 Fick/1.84 thermo). Echo 12/24 showed EF 20% with severe LV dilation, normal RV, severe LAE, moderate Jon, IVC normal. Repeat RHC in 6/25 again showed low CI (2.2 F/1.87 T) with normal  filling pressures. He had to stop Entresto  with angioedema. Now on Valsartan .  He is not volume overloaded by exam but impedence is down on CorVue since lasix  was reduced by PCP last wk. He reports stable NYHA II-early III symptoms.  - Based on low output on prior RHCs and markedly low EF, VAD was previously discussed but pt has been clear he does not want cardiac surgery  - Patient is RV pacing 83% of the time and also has a LBBB around 150 msec at baseline as well as frequent PVCs. Unable to place CS lead in the past. As above, he has seen Dr Waddell and planning BiV upgrade, pending trajectory of recurrent cancer course.  - Continue digoxin  0.0625 mg daily. Check digoxin  level  - Increase Lasix  back up, alternating between 20 and 40 mg every other day. Check BMP today  - Reduce Valsartan  to 20 mg bid (off Entresto  with angioedema, not a candidate for ACEi or Entresto ). - Continue spironolactone  12.5 mg daily  - Continue Jardiance  10 mg daily - Continue Toprol  XL 25 mg daily. 2. Atrial fibrillation: Permanent and rate controlled  - continue amio + toprol  XL  - Continue Xarelto   3. Gastric cancer: Initial occurrence in 2022, Treated. Now w/ recurrence as of 8/25. EGD w/ biopsy 8/25 c/w adenocarcinoma. This has just resulted.  Results not reported  to patient yet. He has phone visit scheduled w/ oncology today. - Follows with Dr. Lanny.  4. Type 2 diabetes: followed by PCP  - Continue Jardiance . 5. OSA: Suspected. He declines sleep study. 6. PVCs: Zio showed high PVC burden.  - started on amiodarone  7/25 for suppression, will continue  - continue current dose of Toprol  XL. Will not increase given h/o low output.   He will continue to monitor BP at home. Will continue remote monitoring of impedence monthly in device clinic.   Follow up in 2-3 months with Dr. Rolan Caffie Shed PA-C 10/15/23

## 2023-10-15 NOTE — Patient Instructions (Signed)
 CHANGE Lasix  to 40 mg daily, alternating with 20 mg daily.  CHANGE Valsartan  to 20 mg ( 1/2 Tab) Twice daily  CHANGE Spironolactone  to 12.5 mg ( 1/2 tab) daily.  Labs done today, your results will be available in MyChart, we will contact you for abnormal readings.  Your physician recommends that you schedule a follow-up appointment in: 3 months ( December) ** PLEASE CALL THE OFFICE IN OCTOBER TO ARRANGE YOUR FOLLOW UP APPOINTMENT.**  If you have any questions or concerns before your next appointment please send us  a message through [facility] or call our office at [phone].    TO LEAVE A MESSAGE FOR THE NURSE SELECT OPTION 2, PLEASE LEAVE A MESSAGE INCLUDING: YOUR NAME DATE OF BIRTH CALL BACK NUMBER REASON FOR CALL**this is important as we prioritize the call backs  YOU WILL RECEIVE A CALL BACK THE SAME DAY AS LONG AS YOU CALL BEFORE 4:00 PM  At the [facility], you and your health needs are our priority. As part of our continuing mission to provide you with exceptional heart care, we have created designated Provider Care Teams. These Care Teams include your primary Cardiologist (physician) and Advanced Practice Providers (APPs- Physician Assistants and Nurse Practitioners) who all work together to provide you with the care you need, when you need it.   You may see any of the following providers on your designated Care Team at your next follow up: Dr Toribio Fuel Dr Ezra Shuck Dr. Ria Commander Dr. Morene Brownie Amy Lenetta, NP Caffie Shed, [place] [facility], [place] Beckey Coe, NP [place] Lee, NP Ellouise Class, NP Tinnie Redman, PharmD Jaun Bash, PharmD   Please be sure to bring in all your medications bottles to every appointment.    Thank you for choosing [facility]

## 2023-10-15 NOTE — Telephone Encounter (Signed)
 Contacted [facility]-Pathology department via telephone call, per Dr. Lanny. Requested MMR be added on to the stomach biopsy. Was advised that it was added and would take at least 24-48hr to update report.

## 2023-10-16 ENCOUNTER — Ambulatory Visit: Admitting: Podiatry

## 2023-10-16 ENCOUNTER — Telehealth: Payer: Self-pay

## 2023-10-16 ENCOUNTER — Other Ambulatory Visit (HOSPITAL_COMMUNITY): Payer: Self-pay

## 2023-10-16 ENCOUNTER — Other Ambulatory Visit: Payer: Self-pay | Admitting: *Deleted

## 2023-10-16 ENCOUNTER — Other Ambulatory Visit: Payer: Self-pay

## 2023-10-16 ENCOUNTER — Other Ambulatory Visit: Payer: Self-pay | Admitting: Hematology

## 2023-10-16 DIAGNOSIS — M79672 Pain in left foot: Secondary | ICD-10-CM | POA: Diagnosis not present

## 2023-10-16 DIAGNOSIS — B351 Tinea unguium: Secondary | ICD-10-CM

## 2023-10-16 DIAGNOSIS — I5042 Chronic combined systolic (congestive) and diastolic (congestive) heart failure: Secondary | ICD-10-CM | POA: Diagnosis not present

## 2023-10-16 DIAGNOSIS — N179 Acute kidney failure, unspecified: Secondary | ICD-10-CM | POA: Diagnosis not present

## 2023-10-16 DIAGNOSIS — M79671 Pain in right foot: Secondary | ICD-10-CM | POA: Diagnosis not present

## 2023-10-16 DIAGNOSIS — E1142 Type 2 diabetes mellitus with diabetic polyneuropathy: Secondary | ICD-10-CM | POA: Diagnosis not present

## 2023-10-16 DIAGNOSIS — I130 Hypertensive heart and chronic kidney disease with heart failure and stage 1 through stage 4 chronic kidney disease, or unspecified chronic kidney disease: Secondary | ICD-10-CM | POA: Diagnosis not present

## 2023-10-16 DIAGNOSIS — E1122 Type 2 diabetes mellitus with diabetic chronic kidney disease: Secondary | ICD-10-CM | POA: Diagnosis not present

## 2023-10-16 DIAGNOSIS — E1165 Type 2 diabetes mellitus with hyperglycemia: Secondary | ICD-10-CM | POA: Diagnosis not present

## 2023-10-16 DIAGNOSIS — I4892 Unspecified atrial flutter: Secondary | ICD-10-CM | POA: Diagnosis not present

## 2023-10-16 DIAGNOSIS — I482 Chronic atrial fibrillation, unspecified: Secondary | ICD-10-CM | POA: Diagnosis not present

## 2023-10-16 DIAGNOSIS — N1831 Chronic kidney disease, stage 3a: Secondary | ICD-10-CM | POA: Diagnosis not present

## 2023-10-16 DIAGNOSIS — C169 Malignant neoplasm of stomach, unspecified: Secondary | ICD-10-CM

## 2023-10-16 NOTE — Telephone Encounter (Signed)
 LVM stating that Dr. Lanny has ordered a PET scan based off Tumor Board discussion of pt's case.  Stated that someone from [place] [facility] will be contacting the pt to get him scheduled for his PET Scan.  Stated Dr. Lanny will discuss with the pt by the end of the week.  Instructed pt to contact Dr. Demetra office should he have questions or concerns.

## 2023-10-16 NOTE — Progress Notes (Signed)
 Patient presents for evaluation and treatment of tenderness and some redness around nails feet.  Tenderness around toes with walking and wearing shoes.  Physical exam:  General appearance: Alert, pleasant, and in no acute distress.  Vascular: Pedal pulses: DP 2/4 B/L, PT 0/4 B/L. Mild edema lower legs bilaterally  Neu  Dermatologic:  Nails thickened, disfigured, discolored 1-5 BL with subungual debris.  Redness and hypertrophic nail folds along nail folds bilaterally but no signs of drainage or infection.  Musculoskeletal:     Diagnosis: 1. Painful onychomycotic nails 1 through 5 bilaterally. 2. Pain toes 1 through 5 bilaterally.  Plan: -Debrided onychomycotic nails 1 through 5 bilaterally.  Sharply debrided nails with nail clipper and reduced with a power bur.  Return 3 months

## 2023-10-17 ENCOUNTER — Encounter (HOSPITAL_COMMUNITY)
Admission: RE | Admit: 2023-10-17 | Discharge: 2023-10-17 | Disposition: A | Discharge status: Home / Self Care | Source: Ambulatory Visit | Attending: Hematology | Admitting: Hematology

## 2023-10-17 ENCOUNTER — Other Ambulatory Visit (HOSPITAL_COMMUNITY): Payer: Self-pay

## 2023-10-17 ENCOUNTER — Inpatient Hospital Stay: Attending: Hematology | Admitting: Hematology

## 2023-10-17 DIAGNOSIS — Z79620 Long term (current) use of immunosuppressive biologic: Secondary | ICD-10-CM | POA: Diagnosis not present

## 2023-10-17 DIAGNOSIS — I482 Chronic atrial fibrillation, unspecified: Secondary | ICD-10-CM | POA: Diagnosis not present

## 2023-10-17 DIAGNOSIS — R1013 Epigastric pain: Secondary | ICD-10-CM | POA: Insufficient documentation

## 2023-10-17 DIAGNOSIS — C163 Malignant neoplasm of pyloric antrum: Secondary | ICD-10-CM | POA: Insufficient documentation

## 2023-10-17 DIAGNOSIS — E1122 Type 2 diabetes mellitus with diabetic chronic kidney disease: Secondary | ICD-10-CM | POA: Diagnosis not present

## 2023-10-17 DIAGNOSIS — Z5112 Encounter for antineoplastic immunotherapy: Secondary | ICD-10-CM | POA: Insufficient documentation

## 2023-10-17 DIAGNOSIS — I5042 Chronic combined systolic (congestive) and diastolic (congestive) heart failure: Secondary | ICD-10-CM | POA: Diagnosis not present

## 2023-10-17 DIAGNOSIS — N179 Acute kidney failure, unspecified: Secondary | ICD-10-CM | POA: Diagnosis not present

## 2023-10-17 DIAGNOSIS — N1831 Chronic kidney disease, stage 3a: Secondary | ICD-10-CM | POA: Diagnosis not present

## 2023-10-17 DIAGNOSIS — I4892 Unspecified atrial flutter: Secondary | ICD-10-CM | POA: Diagnosis not present

## 2023-10-17 DIAGNOSIS — E1165 Type 2 diabetes mellitus with hyperglycemia: Secondary | ICD-10-CM | POA: Diagnosis not present

## 2023-10-17 DIAGNOSIS — C169 Malignant neoplasm of stomach, unspecified: Secondary | ICD-10-CM | POA: Insufficient documentation

## 2023-10-17 DIAGNOSIS — I130 Hypertensive heart and chronic kidney disease with heart failure and stage 1 through stage 4 chronic kidney disease, or unspecified chronic kidney disease: Secondary | ICD-10-CM | POA: Diagnosis not present

## 2023-10-17 DIAGNOSIS — E1142 Type 2 diabetes mellitus with diabetic polyneuropathy: Secondary | ICD-10-CM | POA: Diagnosis not present

## 2023-10-17 MED ORDER — CLOTRIMAZOLE-BETAMETHASONE 1-0.05 % EX CREA
1.0000 | TOPICAL_CREAM | Freq: Two times a day (BID) | CUTANEOUS | 1 refills | Status: DC
Start: 2023-09-19 — End: 2023-11-25
  Filled 2023-10-17: qty 30, 15d supply, fill #0

## 2023-10-18 ENCOUNTER — Inpatient Hospital Stay: Admitting: Dietician

## 2023-10-18 ENCOUNTER — Encounter (HOSPITAL_COMMUNITY)
Admission: RE | Admit: 2023-10-18 | Discharge: 2023-10-18 | Disposition: A | Discharge status: Home / Self Care | Source: Ambulatory Visit | Attending: Hematology | Admitting: Hematology

## 2023-10-18 DIAGNOSIS — E1142 Type 2 diabetes mellitus with diabetic polyneuropathy: Secondary | ICD-10-CM | POA: Diagnosis not present

## 2023-10-18 DIAGNOSIS — I482 Chronic atrial fibrillation, unspecified: Secondary | ICD-10-CM | POA: Diagnosis not present

## 2023-10-18 DIAGNOSIS — I4892 Unspecified atrial flutter: Secondary | ICD-10-CM | POA: Diagnosis not present

## 2023-10-18 DIAGNOSIS — C169 Malignant neoplasm of stomach, unspecified: Secondary | ICD-10-CM | POA: Insufficient documentation

## 2023-10-18 DIAGNOSIS — I5042 Chronic combined systolic (congestive) and diastolic (congestive) heart failure: Secondary | ICD-10-CM | POA: Diagnosis not present

## 2023-10-18 DIAGNOSIS — E1122 Type 2 diabetes mellitus with diabetic chronic kidney disease: Secondary | ICD-10-CM | POA: Diagnosis not present

## 2023-10-18 DIAGNOSIS — N179 Acute kidney failure, unspecified: Secondary | ICD-10-CM | POA: Diagnosis not present

## 2023-10-18 DIAGNOSIS — I130 Hypertensive heart and chronic kidney disease with heart failure and stage 1 through stage 4 chronic kidney disease, or unspecified chronic kidney disease: Secondary | ICD-10-CM | POA: Diagnosis not present

## 2023-10-18 DIAGNOSIS — N1831 Chronic kidney disease, stage 3a: Secondary | ICD-10-CM | POA: Diagnosis not present

## 2023-10-18 DIAGNOSIS — E1165 Type 2 diabetes mellitus with hyperglycemia: Secondary | ICD-10-CM | POA: Diagnosis not present

## 2023-10-18 LAB — GLUCOSE, CAPILLARY: Glucose-Capillary: 99 mg/dL (ref 70–99)

## 2023-10-18 LAB — SURGICAL PATHOLOGY

## 2023-10-18 MED ORDER — FLUDEOXYGLUCOSE F - 18 (FDG) INJECTION
7.6100 | Freq: Once | INTRAVENOUS | Status: AC | PRN
  Administered 2023-10-18: 7.61 via INTRAVENOUS

## 2023-10-18 NOTE — Progress Notes (Signed)
 Nutrition Assessment   Reason for Assessment:   Referral for neoplasm of stomach  ASSESSMENT:  73 year old male with history of gastric and prostate cancer followed by Dr Lanny. History of CHF, defibrillator, CAD, DM, afib, HTN.   Noted hospital admission and per MD note malignant appearing mass in lesser curvature of stomach.  Patient had PET scan today.  Plan of care to be determined.   Spoke with patient via phone for nutrition assessment.  Reports that his appetite is good.  Eating smaller portions.  Has spoken with Dietitian at [facility].  Tries to limit his salt intake.  Breakfast is usually yogurt with granola.  Lunch yesterday was salad with grilled chicken. Grilled chicken salad was for dinner as well.  Drinks premier protein shake about 1 a day.  Reports that he is trying to drink water to stay hydrated.  Reports bowel movement daily.  Says he is working with home PT and walked to [employer] and back today.   Noted during hospital admission stomach pain   Medications: protonix , lasix , metformin , carafate , fesulfate, spironolactone , jardiance , MVI, Vit D, KCL   Labs: reviewed   Anthropometrics:   Height: 66 inches Weight: 155 lb on 9/2 05/14/23 173 lb  BMI: 25  10% weight loss in the last 5 months, (unsure if any of this fluid weight loss)   Estimated Energy Needs  Kcals: 1750-2100 Protein: 70-84 g Fluid: 1750-2100 ml   NUTRITION DIAGNOSIS: Unintentional weight loss related to possible recurrent stomach cancer (abdominal pain) as evidenced by 10% weight loss in the last 5 months and eating smaller portions.    INTERVENTION:  Encouraged oral nutrition supplement, premier protein 1-2 times a day Encouraged small frequent meals including lean protein foods    MONITORING, EVALUATION, GOAL: weight trends, intake   Next Visit: to be determined   Raylyn Speckman B. Dasie SOLON, CSO, LDN Registered Dietitian [phone]

## 2023-10-21 ENCOUNTER — Other Ambulatory Visit (HOSPITAL_COMMUNITY): Payer: Self-pay

## 2023-10-21 ENCOUNTER — Telehealth: Payer: Self-pay | Admitting: *Deleted

## 2023-10-21 ENCOUNTER — Other Ambulatory Visit: Payer: Self-pay | Admitting: *Deleted

## 2023-10-21 ENCOUNTER — Telehealth: Payer: Self-pay | Admitting: Hematology

## 2023-10-21 ENCOUNTER — Encounter: Payer: Self-pay | Admitting: Hematology

## 2023-10-21 DIAGNOSIS — I482 Chronic atrial fibrillation, unspecified: Secondary | ICD-10-CM | POA: Diagnosis not present

## 2023-10-21 DIAGNOSIS — E1165 Type 2 diabetes mellitus with hyperglycemia: Secondary | ICD-10-CM | POA: Diagnosis not present

## 2023-10-21 DIAGNOSIS — I130 Hypertensive heart and chronic kidney disease with heart failure and stage 1 through stage 4 chronic kidney disease, or unspecified chronic kidney disease: Secondary | ICD-10-CM | POA: Diagnosis not present

## 2023-10-21 DIAGNOSIS — I4892 Unspecified atrial flutter: Secondary | ICD-10-CM | POA: Diagnosis not present

## 2023-10-21 DIAGNOSIS — E1142 Type 2 diabetes mellitus with diabetic polyneuropathy: Secondary | ICD-10-CM | POA: Diagnosis not present

## 2023-10-21 DIAGNOSIS — N179 Acute kidney failure, unspecified: Secondary | ICD-10-CM | POA: Diagnosis not present

## 2023-10-21 DIAGNOSIS — N1831 Chronic kidney disease, stage 3a: Secondary | ICD-10-CM | POA: Diagnosis not present

## 2023-10-21 DIAGNOSIS — E1122 Type 2 diabetes mellitus with diabetic chronic kidney disease: Secondary | ICD-10-CM | POA: Diagnosis not present

## 2023-10-21 DIAGNOSIS — I5042 Chronic combined systolic (congestive) and diastolic (congestive) heart failure: Secondary | ICD-10-CM | POA: Diagnosis not present

## 2023-10-21 DIAGNOSIS — C163 Malignant neoplasm of pyloric antrum: Secondary | ICD-10-CM

## 2023-10-21 NOTE — Telephone Encounter (Signed)
 PC to patient, no answer, left VM - informed him has appointment at [facility] in IR on 10/25/23 at 3:00, he is to arrive at 2:30.  This is to have his PAC checked.  Also informed patient of upcoming appointments on 10/28/23. Advised patient to contact this office with any questions/concerns, [phone].

## 2023-10-21 NOTE — Progress Notes (Signed)
 DISCONTINUE OFF PATHWAY REGIMEN - Gastroesophageal   [id number]:Pembrolizumab 200 mg IV D1 q21 Days:   A cycle is every 21 days:     Pembrolizumab   **Always confirm dose/schedule in your pharmacy ordering system**  PRIOR TREATMENT: Off Pathway: Pembrolizumab 200 mg IV D1 q21 Days  START OFF PATHWAY REGIMEN - Gastroesophageal   [id number]:Nivolumab 360 mg IV D1,22 + Ipilimumab 1 mg/kg IV D1 q42 Days:   A cycle is every 42 days:     Nivolumab      Ipilimumab   **Always confirm dose/schedule in your pharmacy ordering system**  Patient Characteristics: Distant Metastases (cM1/pM1) / Locally Recurrent Disease, Adenocarcinoma - Esophageal, GE Junction, and Gastric, Second Line, MSI-H/dMMR Therapeutic Status: Local Recurrence (No Additional Staging) Histology: Adenocarcinoma Disease Classification: Gastric Line of Therapy: Second Line Microsatellite/Mismatch Repair Status: MSI-H/dMMR Intent of Therapy: Non-Curative / Palliative Intent, Discussed with Patient

## 2023-10-21 NOTE — Telephone Encounter (Signed)
 Spoke  with pt wife regarding   the pt upcoming appts.

## 2023-10-22 ENCOUNTER — Other Ambulatory Visit: Payer: Self-pay

## 2023-10-22 ENCOUNTER — Encounter: Payer: Self-pay | Admitting: Hematology

## 2023-10-22 ENCOUNTER — Encounter: Payer: Self-pay | Admitting: Cardiology

## 2023-10-22 DIAGNOSIS — E1142 Type 2 diabetes mellitus with diabetic polyneuropathy: Secondary | ICD-10-CM | POA: Diagnosis not present

## 2023-10-22 DIAGNOSIS — I482 Chronic atrial fibrillation, unspecified: Secondary | ICD-10-CM | POA: Diagnosis not present

## 2023-10-22 DIAGNOSIS — I130 Hypertensive heart and chronic kidney disease with heart failure and stage 1 through stage 4 chronic kidney disease, or unspecified chronic kidney disease: Secondary | ICD-10-CM | POA: Diagnosis not present

## 2023-10-22 DIAGNOSIS — I4892 Unspecified atrial flutter: Secondary | ICD-10-CM | POA: Diagnosis not present

## 2023-10-22 DIAGNOSIS — N1831 Chronic kidney disease, stage 3a: Secondary | ICD-10-CM | POA: Diagnosis not present

## 2023-10-22 DIAGNOSIS — E1122 Type 2 diabetes mellitus with diabetic chronic kidney disease: Secondary | ICD-10-CM | POA: Diagnosis not present

## 2023-10-22 DIAGNOSIS — E1165 Type 2 diabetes mellitus with hyperglycemia: Secondary | ICD-10-CM | POA: Diagnosis not present

## 2023-10-22 DIAGNOSIS — I5042 Chronic combined systolic (congestive) and diastolic (congestive) heart failure: Secondary | ICD-10-CM | POA: Diagnosis not present

## 2023-10-22 DIAGNOSIS — N179 Acute kidney failure, unspecified: Secondary | ICD-10-CM | POA: Diagnosis not present

## 2023-10-22 NOTE — Assessment & Plan Note (Signed)
 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  S/p FLOT4 01/02/2021 - 03/22/2021 -Posttreatment EUS 05/2021 and 06/29/2022 by Dr. Rollin showed a scar and residual erythema in stomach, biopsy negative for residual malignancy.  -Jon Williams is clinically doing well on surveillance. Last CT 03/20/22 which shows no evidence of recurrent or metastatic disease, and stable bilateral adrenal adenomas -Surveillance CT scan from October 09, 2022 showed no evidence of recurrence -Surveillance CT scan from April 30, 2023 showed no evidence of residual or recurrent malignancy.  Small liver lesion is stable, favored to be benign. -He unfortunately had local recurrence in 09/2023

## 2023-10-22 NOTE — Progress Notes (Signed)
 [facility]   [phone] [phone]   Clinic Follow up Note   Patient Care Team: [name], MD as PCP - General (Internal Medicine) [name], Aleene PARAS, MD (Inactive) as PCP - Cardiology (Cardiology) Waddell Danelle ORN, MD as PCP - Electrophysiology (Cardiology) Dasie Leonor CROME, MD as Consulting Physician (General Surgery) Lanny Callander, MD as Consulting Physician (Hematology) Rollin Dover, MD as Consulting Physician (Gastroenterology) 10/22/2023  I connected with Jon Williams on 09/20/2023 at  8:40 AM EDT by telephone and verified that I am speaking with the correct person using two identifiers.   I discussed the limitations, risks, security and privacy concerns of performing an evaluation and management service by telephone and the availability of in person appointments. I also discussed with the patient that there may be a patient responsible charge related to this service. The patient expressed understanding and agreed to proceed.   Patient's location:  Home  Provider's location:  Office    CHIEF COMPLAINT: f/u gastric cancer    CURRENT THERAPY: pending   Oncology history Gastric cancer (HCC) cT3N0M0, stage II -Diagnosed 11/2020, path showed loss of MLH1 and PMS2 which predicts good response to immunotherapy  -Deemed a high risk surgical candidate.  S/p FLOT4 01/02/2021 - 03/22/2021 -Posttreatment EUS 05/2021 and 06/29/2022 by Dr. Rollin showed a scar and residual erythema in stomach, biopsy negative for residual malignancy.  -Jon Williams is clinically doing well on surveillance. Last CT 03/20/22 which shows no evidence of recurrent or metastatic disease, and stable bilateral adrenal adenomas -Surveillance CT scan from October 09, 2022 showed no evidence of recurrence -Surveillance CT scan from April 30, 2023 showed no evidence of residual or recurrent malignancy.  Small liver lesion is stable, favored to be benign. -He unfortunately had local recurrence  in 09/2023   Assessment and Plan Assessment & Plan Recurrent gastric cancer of the pyloric antrum without metastasis Recurrent gastric cancer confirmed by biopsy on October 04, 2023, located in the pyloric antrum without lymph node involvement or distant metastasis as per recent PET scan. Eligible for immunotherapy due to MMR test results. Immunotherapy with ipilimumab and nivolumab recommended due to potential for better outcomes, despite higher risk of side effects. Side effects include fatigue, cough, shortness of breath, skin rash, joint pain, liver damage, diabetes, and thyroid  dysfunction. Approximately 10% risk of serious side effects requiring cessation of therapy or steroid treatment. - Initiate combination immunotherapy with ipilimumab and nivolumab every three weeks for four cycles. - Transition to maintenance therapy with nivolumab every four weeks after initial four cycles. - Request insurance approval for immunotherapy. - Schedule infusion appointments. - Prescribe Zofran  for nausea management.  Stomach pain secondary to gastric cancer Stomach pain reported with a severity of 7 out of 10. Managed with tramadol  and Tylenol , with Tylenol  providing relief and allowing for increased activity. - Continue Tylenol  for pain management. - Use tramadol  as needed for severe pain.  Port dysfunction under evaluation Port dysfunction noted with difficulty in drawing blood, occurring on two separate occasions. Consideration for radiology evaluation to assess port function. - Refer to radiology for dye study to evaluate port function. - Use lidocaine  cream on the day of port evaluation.  Plan -I reviewed his PET and biopsy results. - Due to the MSI-high disease, I recommend immunotherapy with nivolumab and ipilimumab, plan to start next week. - Follow-up 1 week after first infusion.   SUMMARY OF ONCOLOGIC HISTORY: Oncology History Overview Note  Cancer Staging Gastric cancer  (HCC)  Staging form: Stomach, AJCC 8th Edition - Clinical stage from 12/01/2020: Stage IIB (cT3, cN0, cM0) - Signed by Lanny Callander, MD on 12/20/2020 Stage prefix: Initial diagnosis Total positive nodes: 0  Malignant neoplasm of prostate (HCC) Staging form: Prostate, AJCC 8th Edition - Clinical: Stage IIC (cT2b, cN0, cM0, PSA: 6.7, Grade Group: 4) - Unsigned Prostate specific antigen (PSA) range: Less than 10 Gleason score: 8 Histologic grading system: 5 grade system    Gastric cancer (HCC)  12/01/2020 Procedure   Upper Endoscopy, Dr. Rollin  Impression: - Normal esophagus. 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 Biopsied. - Normal examined duodenum.   12/01/2020 Pathology Results   FINAL MICROSCOPIC DIAGNOSIS:   A. INCISURA, BIOPSY:  - Adenocarcinoma, moderate to poorly differentiated arising in a  background of chronic gastritis with intestinal metaplasia.    12/01/2020 Imaging   CT CAP  IMPRESSION: Focal wall thickening along the posterior aspect of the gastric antrum, likely corresponding to the patient's newly diagnosed gastric cancer.   No findings suspicious for metastatic disease.   Mild multifocal pneumonia, lower lobe predominant, likely on the basis of aspiration. Trace right pleural effusion.   Fiducial markers along the prostate in this patient with known prostate cancer.   12/01/2020 Cancer Staging   Staging form: Stomach, AJCC 8th Edition - Clinical stage from 12/01/2020: Stage IIB (cT3, cN0, cM0) - Signed by Lanny Callander, MD on 12/20/2020 Stage prefix: Initial diagnosis Total positive nodes: 0   12/08/2020 Initial Diagnosis   Gastric cancer (HCC)   12/16/2020 Procedure   EUS  - Wall thickening was seen in the lesser curve of the stomach and in the antrum of the stomach. The thickening appeared to be primarily within the serosa (Layer 5). T3 N0 Mx. - There was no sign of significant pathology in the entire pancreas. - There was no sign of significant  pathology in the common bile duct and in the gallbladder. - There was no evidence of significant pathology in the left lobe of the liver. - Endosonographic images of the left adrenal gland were unremarkable. - The celiac trunk and superior mesenteric artery were endosonographically normal. - The mediastinum was unremarkable endosonographically. - No specimens collected.   01/02/2021 - 03/23/2021 Chemotherapy   Patient is on Treatment Plan : GASTROESOPHAGEAL FLOT q14d X 4 cycles     04/01/2021 Imaging   EXAM: CT ABDOMEN AND PELVIS WITHOUT CONTRAST  IMPRESSION: 1. No acute findings in the abdomen or pelvis. Specifically, no evidence for metastatic disease in the abdomen or pelvis. 2. Stable 16 mm left adrenal adenoma. 3. Small fat containing hernias in the right groin and umbilical region. 4. Bilateral pars interarticularis defects at L4 with 10 mm anterolisthesis of L4 on 5, stable. 5. Aortic Atherosclerosis (ICD10-I70.0).   04/13/2021 Imaging   EXAM: CT CHEST WITHOUT CONTRAST  IMPRESSION: 1. No evidence of metastatic disease in the chest. 2. Enlargement of the main pulmonary artery, as can be seen in pulmonary hypertension. 3. Coronary artery disease.   Aortic Atherosclerosis (ICD10-I70.0).   06/09/2021 Procedure   Upper GI Endoscopy, Dr. Rollin  Findings: -The esophagus was normal. -A medium healed ulcer was found on the lesser curvature of the stomach. Biopsies were taken with a cold forceps for histology. -The examined duodenum was normal. -Compared to the prior EGD there is a marked improvement in his gastric cancer. There was only evidence of erythema and a central scar at the incisura. Cold biopsies of the area were obtained, but there was no  gross evidence of malignancy.  Impression: - Normal esophagus. - Scar in the lesser curvature of the stomach. Biopsied. - Normal examined duodenum.   06/09/2021 Pathology Results   FINAL MICROSCOPIC DIAGNOSIS:   A. STOMACH,  BIOPSY:  - Gastric mucosa with acute and chronic inflammation.  - No dysplasia or malignancy identified.    06/16/2021 - 06/16/2021 Chemotherapy   Patient is on Treatment Plan : GASTROESOPHAGEAL Pembrolizumab (200) q21d     10/28/2023 -  Chemotherapy   Patient is on Treatment Plan : COLORECTAL Nivolumab (3) + Ipilimumab (1) q21d / Nivolumab (240) q14d       Discussed the use of AI scribe software for clinical note transcription with the patient, who gave verbal consent to proceed.  History of Present Illness Jon Williams is a 73 year old male with recurrent gastric cancer who presents for a follow-up regarding his condition. He is accompanied by his wife, Mrs. Fischler.  He has recurrent gastric cancer, initially diagnosed three years ago, with recent confirmation through a PET scan and biopsy. He experiences stomach pain rated at 7 out of 10, managed with tramadol  and Tylenol , which aid in daily activities. A port is in place for treatment, but there are difficulties with blood draws, necessitating lab visits for blood collection.     REVIEW OF SYSTEMS:   Constitutional: Denies fevers, chills or abnormal weight loss Eyes: Denies blurriness of vision Ears, nose, mouth, throat, and face: Denies mucositis or sore throat Respiratory: Denies cough, dyspnea or wheezes Cardiovascular: Denies palpitation, chest discomfort or lower extremity swelling Gastrointestinal:  Denies nausea, heartburn or change in bowel habits Skin: Denies abnormal skin rashes Lymphatics: Denies new lymphadenopathy or easy bruising Neurological:Denies numbness, tingling or new weaknesses Behavioral/Psych: Mood is stable, no new changes  All other systems were reviewed with the patient and are negative.  MEDICAL HISTORY:  Past Medical History:  Diagnosis Date   AICD (automatic cardioverter/defibrillator) present 05/25/2016   biv icd   Angio-edema    Bunion, right    Chronic combined systolic and diastolic CHF  (congestive heart failure) (HCC)    Echo 1/18: Mild conc LVH, EF 15-20, severe diff HK, inf and inf-septal AK, Gr 3 DD, mild to mod Jon, severe LAE, mod reduced RVSF, mod RAE, mild TR, PASP 50   Coronary artery disease involving native coronary artery without angina pectoris 04/17/2016   LHC 1/18: pLCx 30, mLCx 20, mRCA 40, dRCA 20, LVEDP 23, mean RA 8, PA 42/20, PCWP 17   Diabetes mellitus without complication (HCC)    Gastric cancer (HCC)    History of atrial fibrillation    History of atrial flutter    History of cardiomegaly 06/07/2016   Noted on CXR   History of colon polyps 06/28/2017   Noted on colonoscopy   Hypertension    LBBB (left bundle branch block)    Nausea vomiting and diarrhea 04/04/2021   NICM (nonischemic cardiomyopathy) (HCC)    Echo 1/18:  Mild conc LVH, EF 15-20, severe diff HK, inf and inf-septal AK, Gr 3 DD, mild to mod Jon, severe LAE, mod reduced RVSF, mod RAE, mild TR, PASP 50   Other secondary pulmonary hypertension (HCC) 04/17/2016   Prostate cancer (HCC) 2019   Sigmoid diverticulosis 06/28/2017   Noted on colonoscopy    SURGICAL HISTORY: Past Surgical History:  Procedure Laterality Date   BIOPSY  12/01/2020   Procedure: BIOPSY;  Surgeon: Rollin Dover, MD;  Location: [facility] ENDOSCOPY;  Service: Endoscopy;;   BIOPSY  06/09/2021   Procedure: BIOPSY;  Surgeon: Rollin Dover, MD;  Location: [facility]OSCOPY;  Service: Gastroenterology;;   BIOPSY  06/29/2022   Procedure: BIOPSY;  Surgeon: Rollin Dover, MD;  Location: [facility] ENDOSCOPY;  Service: Gastroenterology;;   BIV ICD INSERTION CRT-D N/A 05/25/2016   Procedure: BiV ICD Insertion CRT-D;  Surgeon: Danelle LELON Birmingham, MD;  Location: [facility] INVASIVE CV LAB;  Service: Cardiovascular;  Laterality: N/A;   CARDIAC CATHETERIZATION N/A 03/02/2016   Procedure: Right/Left Heart Cath and Coronary Angiography;  Surgeon: Lonni Hanson, MD;  Location: [facility] INVASIVE CV LAB;  Service: Cardiovascular;  Laterality: N/A;   CARDIOVERSION N/A  07/17/2016   Procedure: Cardioversion;  Surgeon: Birmingham Danelle LELON, MD;  Location: [facility] INVASIVE CV LAB;  Service: Cardiovascular;  Laterality: N/A;   COLONOSCOPY WITH PROPOFOL  N/A 06/28/2017   Procedure: COLONOSCOPY WITH PROPOFOL ;  Surgeon: Rollin Dover, MD;  Location: [facility] ENDOSCOPY;  Service: Endoscopy;  Laterality: N/A;   colonscopy  2009   ESOPHAGOGASTRODUODENOSCOPY N/A 12/01/2020   Procedure: ESOPHAGOGASTRODUODENOSCOPY (EGD);  Surgeon: Rollin Dover, MD;  Location: [facility] ENDOSCOPY;  Service: Endoscopy;  Laterality: N/A;  IDA/guaiac positive stools   ESOPHAGOGASTRODUODENOSCOPY N/A 10/04/2023   Procedure: EGD (ESOPHAGOGASTRODUODENOSCOPY);  Surgeon: Rollin Dover, MD;  Location: [facility];  Service: Gastroenterology;  Laterality: N/A;   ESOPHAGOGASTRODUODENOSCOPY (EGD) WITH PROPOFOL  N/A 12/16/2020   Procedure: ESOPHAGOGASTRODUODENOSCOPY (EGD) WITH PROPOFOL ;  Surgeon: Rollin Dover, MD;  Location: [facility] ENDOSCOPY;  Service: Endoscopy;  Laterality: N/A;   ESOPHAGOGASTRODUODENOSCOPY (EGD) WITH PROPOFOL  N/A 06/09/2021   Procedure: ESOPHAGOGASTRODUODENOSCOPY (EGD) WITH PROPOFOL ;  Surgeon: Rollin Dover, MD;  Location: [facility] ENDOSCOPY;  Service: Gastroenterology;  Laterality: N/A;   ESOPHAGOGASTRODUODENOSCOPY (EGD) WITH PROPOFOL  N/A 06/29/2022   Procedure: ESOPHAGOGASTRODUODENOSCOPY (EGD) WITH PROPOFOL ;  Surgeon: Rollin Dover, MD;  Location: [facility] ENDOSCOPY;  Service: Gastroenterology;  Laterality: N/A;   GOLD SEED IMPLANT N/A 12/24/2017   Procedure: GOLD SEED IMPLANT, TRANSERINEAL;  Surgeon: Nieves Cough, MD;  Location: [facility] ORS;  Service: Urology;  Laterality: N/A;   INSERT / REPLACE / REMOVE PACEMAKER     LEAD REVISION  10/10/2018   LEAD REVISION/REPAIR N/A 10/10/2018   Procedure: LEAD REVISION/REPAIR;  Surgeon: Birmingham Danelle LELON, MD;  Location: [facility] INVASIVE CV LAB;  Service: Cardiovascular;  Laterality: N/A;   POLYPECTOMY  06/28/2017   Procedure: POLYPECTOMY;  Surgeon: Rollin Dover, MD;  Location: [facility] ENDOSCOPY;   Service: Endoscopy;;  ascending and descending colon polyp   PORTACATH PLACEMENT Right 12/23/2020   Procedure: INSERTION PORT-A-CATH;  Surgeon: Dasie Leonor CROME, MD;  Location: [facility] OR;  Service: General;  Laterality: Right;   PROSTATE BIOPSY  02/20/2017   RIGHT HEART CATH N/A 07/25/2023   Procedure: RIGHT HEART CATH;  Surgeon: Rolan Ezra RAMAN, MD;  Location: [facility] INVASIVE CV LAB;  Service: Cardiovascular;  Laterality: N/A;   RIGHT/LEFT HEART CATH AND CORONARY ANGIOGRAPHY N/A 10/30/2022   Procedure: RIGHT/LEFT HEART CATH AND CORONARY ANGIOGRAPHY;  Surgeon: Rolan Ezra RAMAN, MD;  Location: [facility] INVASIVE CV LAB;  Service: Cardiovascular;  Laterality: N/A;   SPACE OAR INSTILLATION N/A 12/24/2017   Procedure: SPACE OAR INSTILLATION;  Surgeon: Nieves Cough, MD;  Location: [facility] ORS;  Service: Urology;  Laterality: N/A;   TOTAL KNEE ARTHROPLASTY Right 08/16/2017   Procedure: RIGHT TOTAL KNEE ARTHROPLASTY;  Surgeon: Shari Sieving, MD;  Location: [facility] OR;  Service: Orthopedics;  Laterality: Right;   UPPER ESOPHAGEAL ENDOSCOPIC ULTRASOUND (EUS) N/A 12/16/2020   Procedure: UPPER ESOPHAGEAL ENDOSCOPIC ULTRASOUND (EUS);  Surgeon: Rollin Dover, MD;  Location: [facility];  Service: Endoscopy;  Laterality: N/A;  I have reviewed the social history and family history with the patient and they are unchanged from previous note.  ALLERGIES:  is allergic to aspirin , sacubitril -valsartan , sulfa antibiotics, and ace inhibitors.  MEDICATIONS:  Current Outpatient Medications  Medication Sig Dispense Refill   Accu-Chek Softclix Lancets lancets Use to check your blood sugar finger stick once a day Dx Code E11.9 90 days 100 each 3   amiodarone  (PACERONE ) 200 MG tablet Take 1 tablet (200 mg total) by mouth 2 (two) times daily for 14 days, THEN 1 tablet (200 mg total) daily. 180 tablet 3   atorvastatin  (LIPITOR) 40 MG tablet Take 1 tablet (40 mg total) by mouth daily. 90 tablet 3   Biotin 1000 MCG tablet Take 1,000 mcg by  mouth daily with breakfast.     Blood Glucose Monitoring Suppl (ACCU-CHEK GUIDE) w/Device KIT as directed to check blood sugar once a day 1 kit 0   cetirizine  (ZYRTEC ) 10 MG tablet Take 1 tablet (10 mg total) by mouth daily. 30 tablet 0   cholecalciferol  (VITAMIN D3) 25 MCG (1000 UT) tablet Take 1,000 Units by mouth daily with breakfast.     clotrimazole -betamethasone  (LOTRISONE ) cream Apply 1 Application topically 2 (two) times daily.     clotrimazole -betamethasone  (LOTRISONE ) cream Apply a thin layer topically 2 (two) times daily for 1 to 2 weeks. 30 g 1   digoxin  (LANOXIN ) 0.125 MG tablet Take 1/2 tablet (0.0625mg  total) by mouth daily. 15 tablet 6   empagliflozin  (JARDIANCE ) 10 MG TABS tablet Take 1 tablet (10 mg total) by mouth daily. 90 tablet 3   EPINEPHrine  0.3 mg/0.3 mL IJ SOAJ injection Inject 0.3 mg into the muscle as needed for anaphylaxis. 2 each 1   ferrous sulfate 325 (65 FE) MG tablet Take 325 mg by mouth daily with breakfast.     furosemide  (LASIX ) 40 MG tablet Take 1 tablet (40 mg total) by mouth every other day. Alternating with 20 mg. 30 tablet 6   gabapentin  (NEURONTIN ) 100 MG capsule Take 1 capsule (100 mg total) by mouth 2 (two) times daily. 180 capsule 1   glucose blood (ACCU-CHEK GUIDE TEST) test strip Use to check blood sugars once daily. 100 each 3   ipratropium (ATROVENT ) 0.03 % nasal spray Place 1-2 sprays into both nostrils 2 (two) times daily as needed (nasal drainage). 30 mL 2   lidocaine -prilocaine  (EMLA ) cream Apply 1 Application topically as needed. 30 g 0   metFORMIN  (GLUCOPHAGE -XR) 500 MG 24 hr tablet Take 1 tablet (500 mg total) by mouth daily with evening meal. 90 tablet 3   metoprolol  succinate (TOPROL -XL) 25 MG 24 hr tablet Take 1 tablet (25 mg total) by mouth daily. 90 tablet 3   multivitamin (ONE-A-DAY MEN'S) TABS tablet Take 1 tablet by mouth daily with breakfast.     pantoprazole  (PROTONIX ) 40 MG tablet Take 1 tablet (40 mg total) by mouth daily. 30  tablet 1   pantoprazole  (PROTONIX ) 40 MG tablet Take 1 tablet (40 mg total) by mouth 2 (two) times daily. 180 tablet 3   potassium chloride  (KLOR-CON ) 10 MEQ tablet Take 2 tablets (20 mEq total) by mouth daily. (Patient taking differently: Take 10 mEq by mouth 2 (two) times daily.) 60 tablet 3   rivaroxaban  (XARELTO ) 20 MG TABS tablet Take 1 tablet (20 mg total) by mouth daily with supper. 90 tablet 3   spironolactone  (ALDACTONE ) 25 MG tablet Take 0.5 tablets (12.5 mg total) by mouth daily.     sucralfate  (CARAFATE ) 1  GM/10ML suspension Take 10 mLs (1 g total) by mouth 4 (four) times daily -  with meals and at bedtime. 420 mL 1   traMADol  (ULTRAM ) 50 MG tablet Take 1 tablet (50 mg total) by mouth every 6 (six) hours as needed. 30 tablet 0   valsartan  (DIOVAN ) 40 MG tablet Take 0.5 tablets (20 mg total) by mouth 2 (two) times daily.     No current facility-administered medications for this visit.    PHYSICAL EXAMINATION: Not performed   LABORATORY DATA:  I have reviewed the data as listed    Latest Ref Rng & Units 10/08/2023    1:05 PM 09/28/2023    6:19 AM 09/27/2023    2:51 PM  CBC  WBC 4.0 - 10.5 K/uL 9.8  7.9  9.9   Hemoglobin 13.0 - 17.0 g/dL 9.9  9.1  89.5   Hematocrit 39.0 - 52.0 % 31.6  28.1  33.6   Platelets 150 - 400 K/uL 327  339  390         Latest Ref Rng & Units 10/15/2023    9:15 AM 10/08/2023    1:05 PM 10/08/2023    1:04 PM  CMP  Glucose 70 - 99 mg/dL 95  892  892   BUN 8 - 23 mg/dL 19  22  22    Creatinine 0.61 - 1.24 mg/dL 8.70  8.70  8.70   Sodium 135 - 145 mmol/L 136  138  137   Potassium 3.5 - 5.1 mmol/L 4.3  4.5  4.7   Chloride 98 - 111 mmol/L 102  101  102   CO2 22 - 32 mmol/L 22  27  26    Calcium  8.9 - 10.3 mg/dL 9.1  9.3  9.3   Total Protein 6.5 - 8.1 g/dL  7.1  7.0   Total Bilirubin 0.0 - 1.2 mg/dL  0.5  0.5   Alkaline Phos 38 - 126 U/L  41  41   AST 15 - 41 U/L  12  12   ALT 0 - 44 U/L  11  11       RADIOGRAPHIC STUDIES: I have personally  reviewed the radiological images as listed and agreed with the findings in the report. No results found.     I discussed the assessment and treatment plan with the patient. The patient was provided an opportunity to ask questions and all were answered. The patient agreed with the plan and demonstrated an understanding of the instructions.   The patient was advised to call back or seek an in-person evaluation if the symptoms worsen or if the condition fails to improve as anticipated.  I provided 40 minutes of non face-to-face telephone visit time during this encounter, including review of chart and various tests results, discussions about plan of care and coordination of care plan.    Onita Mattock, MD 10/21/2023

## 2023-10-23 ENCOUNTER — Other Ambulatory Visit

## 2023-10-23 DIAGNOSIS — I130 Hypertensive heart and chronic kidney disease with heart failure and stage 1 through stage 4 chronic kidney disease, or unspecified chronic kidney disease: Secondary | ICD-10-CM | POA: Diagnosis not present

## 2023-10-23 DIAGNOSIS — E1165 Type 2 diabetes mellitus with hyperglycemia: Secondary | ICD-10-CM | POA: Diagnosis not present

## 2023-10-23 DIAGNOSIS — I482 Chronic atrial fibrillation, unspecified: Secondary | ICD-10-CM | POA: Diagnosis not present

## 2023-10-23 DIAGNOSIS — I5042 Chronic combined systolic (congestive) and diastolic (congestive) heart failure: Secondary | ICD-10-CM | POA: Diagnosis not present

## 2023-10-23 DIAGNOSIS — E1142 Type 2 diabetes mellitus with diabetic polyneuropathy: Secondary | ICD-10-CM | POA: Diagnosis not present

## 2023-10-23 DIAGNOSIS — I4892 Unspecified atrial flutter: Secondary | ICD-10-CM | POA: Diagnosis not present

## 2023-10-23 DIAGNOSIS — N179 Acute kidney failure, unspecified: Secondary | ICD-10-CM | POA: Diagnosis not present

## 2023-10-23 DIAGNOSIS — E1122 Type 2 diabetes mellitus with diabetic chronic kidney disease: Secondary | ICD-10-CM | POA: Diagnosis not present

## 2023-10-23 DIAGNOSIS — N1831 Chronic kidney disease, stage 3a: Secondary | ICD-10-CM | POA: Diagnosis not present

## 2023-10-23 NOTE — Progress Notes (Signed)
 Patient Care Team: [name], MD as PCP - General (Internal Medicine) [name], Aleene PARAS, MD (Inactive) as PCP - Cardiology (Cardiology) Waddell Danelle ORN, MD as PCP - Electrophysiology (Cardiology) Dasie Leonor CROME, MD as Consulting Physician (General Surgery) Lanny Callander, MD as Consulting Physician (Hematology) Rollin Dover, MD as Consulting Physician (Gastroenterology)  Clinic Day:  10/28/2023  Referring physician: Lanny Callander, MD  ASSESSMENT & PLAN:   Assessment & Plan: Gastric cancer (HCC) cT3N0M0, stage II -Diagnosed 11/2020, path showed loss of MLH1 and PMS2 which predicts good response to immunotherapy  -Deemed a high risk surgical candidate.  S/p FLOT4 01/02/2021 - 03/22/2021 -Posttreatment EUS 05/2021 and 06/29/2022 by Dr. Rollin showed a scar and residual erythema in stomach, biopsy negative for residual malignancy.  -[name] is clinically doing well on surveillance. Last CT 03/20/22 which shows no evidence of recurrent or metastatic disease, and stable bilateral adrenal adenomas -Surveillance CT scan from October 09, 2022 showed no evidence of recurrence -Surveillance CT scan from April 30, 2023 showed no evidence of residual or recurrent malignancy.  Small liver lesion is stable, favored to be benign. -He unfortunately had local recurrence in 09/2023 10/28/2023 - cycle 1 nivolumab  and ipilimumab  today. Port revision scheduled for 11/04/2023 at [facility] IR department.  Plan for labs/port flush, follow up, and treatment 11/18/2023 as scheduled.    Epigastric abdominal pain The patient is having 10/10 epigastric, burning type sensation. He states that it comes and goes and starts mostly after he eats. Pain started after he ate breakfast this morning. He does have prescription for Tramadol  which he is reluctant to take. States that he only takes 1/2 tablet at home If really needed. He is currently taking sucralfate  4 times daily, prior to meals. He is also taking protonix , at least daily. He  will get pepcid  as premedication this morning. Will arrange for him to get 1 mg morphine  IV once. May repeat dose if needed.   Port placement concerns The patient was seen in IR on 10/25/2023. The report indicates that there is stable position of right subclavian port catheter, tip in the proximal SVC. Stable left subclavian AICD leads. There is patency of the port with no leak. The SVC is occluded just distal to the confluence of the brachiocephalic veins. Injected contrast flows retrograde up the right brachiocephalic vein and drains via body wall collaterals. I have messaged Dr. Merrie, interventional Radiology, who suggests port revision. Currently the patient will be scheduled for this on 11/06/2023. The patient is on Xarelto  20 mg daily. He will receive immunotherapy with nivolumab  and ipilimumab  through peripheral IV today. Next scheuled infusion is 11/18/2023. He is now scheduled for Port revision procedure on 11/04/2023.   Constipation Patient seen in ED on 10/26/2023. Was given enema to relieve fecal impaction. The patient states that he got good response from the enema and has had normal bowel movements since then. He denies presence of blood in the stool.   Local gastric cancer recurrence Reviewed the patient's PET scan with him and his wife today. Does show local cancer recurrence without regional lymphadenopathy or distant metastases noted. He is going to start nivolumab  and ipilimumab  today. This will be administered every 3 weeks for 4 cycles. He will then transition to maintenance nivolumab .    Plan: Labs reviewed.  -CBC improved with normal WBC. ANC elevated at 8.6. mild and stable anemia.  -awaiting ferritin results.  -unremarkable CMP Ok to proceed with immunotherapy with nivolumab  and iplilimumab today. Will administer through peripheral  IV.  -port revision scheduled for 11/04/2023. Labs/flush, follow ups, and Cycle 2 immunotherapy as scheduled.    The patient understands the  plans discussed today and is in agreement with them.  He knows to contact our office if he develops concerns prior to his next appointment.  I provided 30 minutes of face-to-face time during this encounter and > 50% was spent counseling as documented under my assessment and plan.    Jon FORBES Lessen, Jon Williams  [facility] - A DEPT OF [facility] [address] [place] [address] Dept: [phone] Dept Fax: [phone]   No orders of the defined types were placed in this encounter.     CHIEF COMPLAINT:  CC: Malignant neoplasm of pyloric antrum  Current Treatment: Combination immunotherapy with nivolumab  and ipilimumab   INTERVAL HISTORY:  Jon Williams is here today for repeat clinical assessment. He had a phone visit with Dr. Lanny on 10/17/2023 and discussed results of PET scan along with treatment options. She discussed starting immunotherapy with nivolumab  and ipilimumab .  He is due to start that today.  Initially, this was scheduled to be given via port.  There has been ongoing concern with his port inability to get blood return.  The patient had procedure done on 10/25/2023 to evaluate concerns with Port-A-Cath.  This showed occlusion of the SVC with patent port. He will be scheduled for Port revision procedure on 11/04/2023. He is currently on xarelto  due to irregular heart rhythm. He will be contacted by IR at [facility] to confirm appointment and details. He does report epigastric pain 10/10 in severity. States that it comes and goes. Happens more frequently after eating. Today, this pain started after breakfast. He is reluctant to take tramadol  which he has at home. He takes sucralfate  and pantoprazole  and will receive pepcid  prior to start of immunotherapy. He denies chest pain, chest pressure, or shortness of breath. He denies headaches or visual disturbances. He denies nausea or vomiting. He states that bowel movements  returned to normal following enema given in the ED. He denies blood in his stool, hematuria, hematemesis, or other abnormal bleeding. He denies fevers or chills. His appetite is good. His weight has decreased 4 pounds over last 2 weeks.  I have reviewed the past medical history, past surgical history, social history and family history with the patient and they are unchanged from previous note.  ALLERGIES:  is allergic to aspirin , sacubitril -valsartan , sulfa antibiotics, and ace inhibitors.  MEDICATIONS:  Current Outpatient Medications  Medication Sig Dispense Refill   Accu-Chek Softclix Lancets lancets Use to check your blood sugar finger stick once a day Dx Code E11.9 90 days 100 each 3   amiodarone  (PACERONE ) 200 MG tablet Take 1 tablet (200 mg total) by mouth 2 (two) times daily for 14 days, THEN 1 tablet (200 mg total) daily. 180 tablet 3   atorvastatin  (LIPITOR) 40 MG tablet Take 1 tablet (40 mg total) by mouth daily. 90 tablet 3   Biotin 1000 MCG tablet Take 1,000 mcg by mouth daily with breakfast.     Blood Glucose Monitoring Suppl (ACCU-CHEK GUIDE) w/Device KIT as directed to check blood sugar once a day 1 kit 0   cetirizine  (ZYRTEC ) 10 MG tablet Take 1 tablet (10 mg total) by mouth daily. 30 tablet 0   cholecalciferol  (VITAMIN D3) 25 MCG (1000 UT) tablet Take 1,000 Units by mouth daily with breakfast.     clotrimazole -betamethasone  (  LOTRISONE ) cream Apply 1 Application topically 2 (two) times daily.     clotrimazole -betamethasone  (LOTRISONE ) cream Apply a thin layer topically 2 (two) times daily for 1 to 2 weeks. 30 g 1   digoxin  (LANOXIN ) 0.125 MG tablet Take 1/2 tablet (0.0625mg  total) by mouth daily. 15 tablet 6   empagliflozin  (JARDIANCE ) 10 MG TABS tablet Take 1 tablet (10 mg total) by mouth daily. 90 tablet 3   EPINEPHrine  0.3 mg/0.3 mL IJ SOAJ injection Inject 0.3 mg into the muscle as needed for anaphylaxis. 2 each 1   ferrous sulfate 325 (65 FE) MG tablet Take 325 mg by mouth  daily with breakfast.     furosemide  (LASIX ) 40 MG tablet Take 1 tablet (40 mg total) by mouth every other day. Alternating with 20 mg. 30 tablet 6   gabapentin  (NEURONTIN ) 100 MG capsule Take 1 capsule (100 mg total) by mouth 2 (two) times daily. 180 capsule 1   glucose blood (ACCU-CHEK GUIDE TEST) test strip Use to check blood sugars once daily. 100 each 3   ipratropium (ATROVENT ) 0.03 % nasal spray Place 1-2 sprays into both nostrils 2 (two) times daily as needed (nasal drainage). 30 mL 2   lidocaine -prilocaine  (EMLA ) cream Apply 1 Application topically as needed. 30 g 0   metFORMIN  (GLUCOPHAGE -XR) 500 MG 24 hr tablet Take 1 tablet (500 mg total) by mouth daily with evening meal. 90 tablet 3   metoprolol  succinate (TOPROL -XL) 25 MG 24 hr tablet Take 1 tablet (25 mg total) by mouth daily. 90 tablet 3   multivitamin (ONE-A-DAY MEN'S) TABS tablet Take 1 tablet by mouth daily with breakfast.     pantoprazole  (PROTONIX ) 40 MG tablet Take 1 tablet (40 mg total) by mouth daily. 30 tablet 1   pantoprazole  (PROTONIX ) 40 MG tablet Take 1 tablet (40 mg total) by mouth 2 (two) times daily. 180 tablet 3   potassium chloride  (KLOR-CON ) 10 MEQ tablet Take 2 tablets (20 mEq total) by mouth daily. (Patient taking differently: Take 10 mEq by mouth 2 (two) times daily.) 60 tablet 3   rivaroxaban  (XARELTO ) 20 MG TABS tablet Take 1 tablet (20 mg total) by mouth daily with supper. 90 tablet 3   spironolactone  (ALDACTONE ) 25 MG tablet Take 0.5 tablets (12.5 mg total) by mouth daily.     sucralfate  (CARAFATE ) 1 GM/10ML suspension Take 10 mLs (1 g total) by mouth 4 (four) times daily -  with meals and at bedtime. 420 mL 1   traMADol  (ULTRAM ) 50 MG tablet Take 1 tablet (50 mg total) by mouth every 6 (six) hours as needed. 30 tablet 0   valsartan  (DIOVAN ) 40 MG tablet Take 0.5 tablets (20 mg total) by mouth 2 (two) times daily.     lidocaine -prilocaine  (EMLA ) cream Apply to affected area once 30 g 3   ondansetron  (ZOFRAN )  8 MG tablet Take 1 tablet (8 mg total) by mouth every 8 (eight) hours as needed for nausea or vomiting. 30 tablet 1   prochlorperazine  (COMPAZINE ) 10 MG tablet Take 1 tablet (10 mg total) by mouth every 6 (six) hours as needed for nausea or vomiting. 30 tablet 1   No current facility-administered medications for this visit.   Facility-Administered Medications Ordered in Other Visits  Medication Dose Route Frequency Provider Last Rate Last Admin   0.9 %  sodium chloride  infusion   Intravenous Continuous Lanny Callander, MD 10 mL/hr at 10/28/23 1100 New Bag at 10/28/23 1100   ipilimumab  (YERVOY ) 70 mg in sodium chloride   0.9 % 50 mL chemo infusion  1 mg/kg (Treatment Plan Recorded) Intravenous Once Lanny Callander, MD 128 mL/hr at 10/28/23 1214 70 mg at 10/28/23 1214    HISTORY OF PRESENT ILLNESS:   Oncology History Overview Note  Cancer Staging Gastric cancer [facility]) Staging form: Stomach, AJCC 8th Edition - Clinical stage from 12/01/2020: Stage IIB (cT3, cN0, cM0) - Signed by Lanny Callander, MD on 12/20/2020 Stage prefix: Initial diagnosis Total positive nodes: 0  Malignant neoplasm of prostate (HCC) Staging form: Prostate, AJCC 8th Edition - Clinical: Stage IIC (cT2b, cN0, cM0, PSA: 6.7, Grade Group: 4) - Unsigned Prostate specific antigen (PSA) range: Less than 10 Gleason score: 8 Histologic grading system: 5 grade system    Gastric cancer (HCC)  12/01/2020 Procedure   Upper Endoscopy, Dr. Rollin  Impression: - Normal esophagus. 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 Biopsied. - Normal examined duodenum.   12/01/2020 Pathology Results   FINAL MICROSCOPIC DIAGNOSIS:   A. INCISURA, BIOPSY:  - Adenocarcinoma, moderate to poorly differentiated arising in a  background of chronic gastritis with intestinal metaplasia.    12/01/2020 Imaging   CT CAP  IMPRESSION: Focal wall thickening along the posterior aspect of the gastric antrum, likely corresponding to the patient's newly  diagnosed gastric cancer.   No findings suspicious for metastatic disease.   Mild multifocal pneumonia, lower lobe predominant, likely on the basis of aspiration. Trace right pleural effusion.   Fiducial markers along the prostate in this patient with known prostate cancer.   12/01/2020 Cancer Staging   Staging form: Stomach, AJCC 8th Edition - Clinical stage from 12/01/2020: Stage IIB (cT3, cN0, cM0) - Signed by Lanny Callander, MD on 12/20/2020 Stage prefix: Initial diagnosis Total positive nodes: 0   12/08/2020 Initial Diagnosis   Gastric cancer (HCC)   12/16/2020 Procedure   EUS  - Wall thickening was seen in the lesser curve of the stomach and in the antrum of the stomach. The thickening appeared to be primarily within the serosa (Layer 5). T3 N0 Mx. - There was no sign of significant pathology in the entire pancreas. - There was no sign of significant pathology in the common bile duct and in the gallbladder. - There was no evidence of significant pathology in the left lobe of the liver. - Endosonographic images of the left adrenal gland were unremarkable. - The celiac trunk and superior mesenteric artery were endosonographically normal. - The mediastinum was unremarkable endosonographically. - No specimens collected.   01/02/2021 - 03/23/2021 Chemotherapy   Patient is on Treatment Plan : GASTROESOPHAGEAL FLOT q14d X 4 cycles     04/01/2021 Imaging   EXAM: CT ABDOMEN AND PELVIS WITHOUT CONTRAST  IMPRESSION: 1. No acute findings in the abdomen or pelvis. Specifically, no evidence for metastatic disease in the abdomen or pelvis. 2. Stable 16 mm left adrenal adenoma. 3. Small fat containing hernias in the right groin and umbilical region. 4. Bilateral pars interarticularis defects at L4 with 10 mm anterolisthesis of L4 on 5, stable. 5. Aortic Atherosclerosis (ICD10-I70.0).   04/13/2021 Imaging   EXAM: CT CHEST WITHOUT CONTRAST  IMPRESSION: 1. No evidence of metastatic  disease in the chest. 2. Enlargement of the main pulmonary artery, as can be seen in pulmonary hypertension. 3. Coronary artery disease.   Aortic Atherosclerosis (ICD10-I70.0).   06/09/2021 Procedure   Upper GI Endoscopy, Dr. Rollin  Findings: -The esophagus was normal. -A medium healed ulcer was found on the lesser curvature of the stomach. Biopsies were taken with a  cold forceps for histology. -The examined duodenum was normal. -Compared to the prior EGD there is a marked improvement in his gastric cancer. There was only evidence of erythema and a central scar at the incisura. Cold biopsies of the area were obtained, but there was no gross evidence of malignancy.  Impression: - Normal esophagus. - Scar in the lesser curvature of the stomach. Biopsied. - Normal examined duodenum.   06/09/2021 Pathology Results   FINAL MICROSCOPIC DIAGNOSIS:   A. STOMACH, BIOPSY:  - Gastric mucosa with acute and chronic inflammation.  - No dysplasia or malignancy identified.    06/16/2021 - 06/16/2021 Chemotherapy   Patient is on Treatment Plan : GASTROESOPHAGEAL Pembrolizumab (200) q21d     10/28/2023 -  Chemotherapy   Patient is on Treatment Plan : GASTRIC Nivolumab  (240) D1,15,29 + Ipilimumab  (1) D1 q42d         REVIEW OF SYSTEMS:   Constitutional: Denies fevers, chills or abnormal weight loss Eyes: Denies blurriness of vision Ears, nose, mouth, throat, and face: Denies mucositis or sore throat Respiratory: Denies cough, dyspnea or wheezes Cardiovascular: Denies palpitation, chest discomfort or lower extremity swelling Gastrointestinal:  reports 10/10 abdominal/epigastric pain. Denies nausea or vomiting. Bowel movements have been normal since enema over the weekend in ED.  Skin: Denies abnormal skin rashes Lymphatics: Denies new lymphadenopathy or easy bruising Neurological:Denies numbness, tingling or new weaknesses Behavioral/Psych: Mood is stable, no new changes  All other systems were  reviewed with the patient and are negative.   VITALS:   Today's Vitals   10/28/23 0954 10/28/23 1007  BP: 110/60   Pulse: 66   Resp: 17   Temp: 98.1 F (36.7 C)   SpO2: 99%   Weight: 151 lb 6.4 oz (68.7 kg)   PainSc:  0-No pain   Body mass index is 24.44 kg/m.   Wt Readings from Last 3 Encounters:  10/28/23 151 lb 6.4 oz (68.7 kg)  10/15/23 155 lb 3.2 oz (70.4 kg)  10/08/23 151 lb 6.4 oz (68.7 kg)    Body mass index is 24.44 kg/m.  Performance status (ECOG): 1 - Symptomatic but completely ambulatory  PHYSICAL EXAM:   GENERAL:alert, no distress and comfortable SKIN: skin color, texture, turgor are normal, no rashes or significant lesions EYES: normal, Conjunctiva are pink and non-injected, sclera clear OROPHARYNX:no exudate, no erythema and lips, buccal mucosa, and tongue normal  NECK: supple, thyroid  normal size, non-tender, without nodularity LYMPH:  no palpable lymphadenopathy in the cervical, axillary or inguinal LUNGS: clear to auscultation and percussion with normal breathing effort HEART: regular rate & rhythm and no murmurs and no lower extremity edema ABDOMEN:abdomen soft with normal bowel sounds. Has moderate to severe epigastric tenderness with palpation of the abdomen.  Musculoskeletal:no cyanosis of digits and no clubbing  NEURO: alert & oriented x 3 with fluent speech, no focal motor/sensory deficits  LABORATORY DATA:  I have reviewed the data as listed    Component Value Date/Time   NA 137 10/28/2023 0926   NA 141 10/08/2018 0756   K 4.2 10/28/2023 0926   CL 103 10/28/2023 0926   CO2 25 10/28/2023 0926   GLUCOSE 117 (H) 10/28/2023 0926   BUN 28 (H) 10/28/2023 0926   BUN 19 10/08/2018 0756   CREATININE 1.14 10/28/2023 0926   CALCIUM  9.3 10/28/2023 0926   PROT 7.3 10/28/2023 0926   PROT 7.0 07/31/2023 1047   ALBUMIN 3.9 10/28/2023 0926   ALBUMIN 4.6 06/06/2016 1453   AST 17 10/28/2023 0926  ALT 14 10/28/2023 0926   ALKPHOS 46 10/28/2023 0926    BILITOT 0.5 10/28/2023 0926   GFRNONAA >60 10/28/2023 0926   GFRAA 74 10/08/2018 0756    Lab Results  Component Value Date   WBC 10.0 10/28/2023   NEUTROABS 8.6 (H) 10/28/2023   HGB 10.2 (L) 10/28/2023   HCT 32.5 (L) 10/28/2023   MCV 90.8 10/28/2023   PLT 370 10/28/2023    RADIOGRAPHIC STUDIES: IR CV Line Injection Result Date: 10/25/2023 CLINICAL DATA:  Right chest port catheter placed 12/23/2020 surgically, has worked well. More recently, recurrent difficulty aspirating from port catheter. Infuses easily. EXAM: PORT  CATHETER INJECTION UNDER FLUOROSCOPY TECHNIQUE: The procedure, risks (including but not limited to bleeding, infection, organ damage ), benefits, and alternatives were explained to the patient. Questions regarding the procedure were encouraged and answered. The patient understands and consents to the procedure. Survey fluoroscopic inspection reveals stable position of right subclavian port catheter, tip proximal SVC. Stable left subclavian AICD leads. Injection demonstrates patency of the port with no leak. The SVC is occluded just distal to the confluence of the brachiocephalic veins. Injected contrast flows retrograde up the right brachiocephalic vein and drains via body wall collaterals. IMPRESSION: 1. Proximal SVC occlusion. 2. Port catheter extends to the distal right brachiocephalic vein, with retrograde flow draining via body wall collaterals. Electronically Signed   By: JONETTA Faes M.D.   On: 10/25/2023 18:00   NM PET Image Initial (PI) Skull Base To Thigh Result Date: 10/20/2023 CLINICAL DATA:  Subsequent treatment strategy for gastric carcinoma. EXAM: NUCLEAR MEDICINE PET SKULL BASE TO THIGH TECHNIQUE: 7.6 mCi F-18 FDG was injected intravenously. Full-ring PET imaging was performed from the skull base to thigh after the radiotracer. CT data was obtained and used for attenuation correction and anatomic localization. Fasting blood glucose: 99 mg/dl COMPARISON:  CT on  92/81/7974 FINDINGS: Mediastinal blood-pool activity (background): SUV max = 2.5 Liver activity (reference): SUV max = N/A NECK:  No hypermetabolic lymph nodes or masses. Incidental CT findings:  None. CHEST: No hypermetabolic lymph nodes. No suspicious pulmonary nodules seen on CT images. Incidental CT findings:  None. ABDOMEN/PELVIS: FDG uptake is seen throughout the stomach, but shows increased intensity in the gastric antrum corresponding to mild diffuse wall thickening on CT images. This has SUV max of 17.5 compared to uptake in the upper gastric body with SUV max of 4.9. No abnormal hypermetabolic activity within the liver, pancreas, adrenal glands, or spleen. No hypermetabolic lymph nodes in the abdomen or pelvis. Incidental CT findings:  Stable small cyst in right hepatic lobe. SKELETON: No focal hypermetabolic bone lesions to suggest skeletal metastasis. Incidental CT findings:  None. IMPRESSION: Increased FDG uptake in the gastric antrum corresponding to mild diffuse wall thickening on CT images. Local recurrence of gastric carcinoma cannot be excluded. Recommend correlation with endoscopy. No evidence of regional lymph node or distant metastatic disease. Electronically Signed   By: Norleen DELENA Kil M.D.   On: 10/20/2023 10:06   CUP PACEART REMOTE DEVICE CHECK Result Date: 10/09/2023 BIV ICD scheduled remote reviewed. Normal device function.  Presenting rhythm: AF-VP (On OAC per chart) 1 NSVT event x 12 beats, irregular. Next remote 91 days. AB, CVRS

## 2023-10-23 NOTE — Assessment & Plan Note (Addendum)
 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  S/p FLOT4 01/02/2021 - 03/22/2021 -Posttreatment EUS 05/2021 and 06/29/2022 by Dr. Rollin showed a scar and residual erythema in stomach, biopsy negative for residual malignancy.  -Jon Williams is clinically doing well on surveillance. Last CT 03/20/22 which shows no evidence of recurrent or metastatic disease, and stable bilateral adrenal adenomas -Surveillance CT scan from October 09, 2022 showed no evidence of recurrence -Surveillance CT scan from April 30, 2023 showed no evidence of residual or recurrent malignancy.  Small liver lesion is stable, favored to be benign. -He unfortunately had local recurrence in 09/2023 10/28/2023 - cycle 1 nivolumab  and ipilimumab  today. Port revision scheduled for 11/04/2023 at [facility] IR department.  Plan for labs/port flush, follow up, and treatment 11/18/2023 as scheduled.

## 2023-10-24 ENCOUNTER — Other Ambulatory Visit: Payer: Self-pay | Admitting: Hematology

## 2023-10-24 DIAGNOSIS — N1831 Chronic kidney disease, stage 3a: Secondary | ICD-10-CM | POA: Diagnosis not present

## 2023-10-24 DIAGNOSIS — E1122 Type 2 diabetes mellitus with diabetic chronic kidney disease: Secondary | ICD-10-CM | POA: Diagnosis not present

## 2023-10-24 DIAGNOSIS — I5042 Chronic combined systolic (congestive) and diastolic (congestive) heart failure: Secondary | ICD-10-CM | POA: Diagnosis not present

## 2023-10-24 DIAGNOSIS — I482 Chronic atrial fibrillation, unspecified: Secondary | ICD-10-CM | POA: Diagnosis not present

## 2023-10-24 DIAGNOSIS — C169 Malignant neoplasm of stomach, unspecified: Secondary | ICD-10-CM | POA: Diagnosis not present

## 2023-10-24 DIAGNOSIS — I130 Hypertensive heart and chronic kidney disease with heart failure and stage 1 through stage 4 chronic kidney disease, or unspecified chronic kidney disease: Secondary | ICD-10-CM | POA: Diagnosis not present

## 2023-10-24 DIAGNOSIS — E1142 Type 2 diabetes mellitus with diabetic polyneuropathy: Secondary | ICD-10-CM | POA: Diagnosis not present

## 2023-10-24 DIAGNOSIS — I428 Other cardiomyopathies: Secondary | ICD-10-CM | POA: Diagnosis not present

## 2023-10-24 DIAGNOSIS — N179 Acute kidney failure, unspecified: Secondary | ICD-10-CM | POA: Diagnosis not present

## 2023-10-24 DIAGNOSIS — E1165 Type 2 diabetes mellitus with hyperglycemia: Secondary | ICD-10-CM | POA: Diagnosis not present

## 2023-10-24 DIAGNOSIS — I4892 Unspecified atrial flutter: Secondary | ICD-10-CM | POA: Diagnosis not present

## 2023-10-24 DIAGNOSIS — I959 Hypotension, unspecified: Secondary | ICD-10-CM | POA: Diagnosis not present

## 2023-10-24 NOTE — Progress Notes (Signed)
 Pharmacist Chemotherapy Monitoring - Initial Assessment    Anticipated start date: 10/28/23   The following has been reviewed per standard work regarding the patient's treatment regimen: The patient's diagnosis, treatment plan and drug doses, and organ/hematologic function Lab orders and baseline tests specific to treatment regimen  The treatment plan start date, drug sequencing, and pre-medications Prior authorization status  Patient's documented medication list, including drug-drug interaction screen and prescriptions for anti-emetics and supportive care specific to the treatment regimen The drug concentrations, fluid compatibility, administration routes, and timing of the medications to be used The patient's access for treatment and lifetime cumulative dose history, if applicable  The patient's medication allergies and previous infusion related reactions, if applicable   Changes made to treatment plan:  TBD  Follow up needed:  Nivolumab/Ipilimumab dosing for gastric cancer Need for take-home Zofran  (not released, no active script, last filled 03/2021)  Harlene JONELLE Nasuti, RPH, 10/24/2023  8:28 AM

## 2023-10-24 NOTE — Progress Notes (Signed)
 Spoke with Dr. Lanny regarding nivolumab and ipilimumab dosing for MSI-H gastric cancer. After review of the NCCN guidelines, available literature, and MD discussion with Dr. Cloretta, the treatment plan has been adjusted accordingly. Patient will receive the following regimen: Nivolumab 240 mg IV every 2 weeks Ipilimumab 1 mg/kg IV every 6 weeks For 12 weeks (2 cycles), followed by Nivolumab 240 mg IV every 2 weeks  Dr. Lanny to inform patient of change in treatment plan dosing.  Harlene Nasuti, PharmD Oncology Infusion Pharmacist 10/24/2023 5:40 PM

## 2023-10-25 ENCOUNTER — Ambulatory Visit (HOSPITAL_COMMUNITY)
Admission: RE | Admit: 2023-10-25 | Discharge: 2023-10-25 | Disposition: A | Discharge status: Home / Self Care | Source: Ambulatory Visit | Attending: Hematology | Admitting: Hematology

## 2023-10-25 DIAGNOSIS — I871 Compression of vein: Secondary | ICD-10-CM | POA: Diagnosis not present

## 2023-10-25 DIAGNOSIS — T82598A Other mechanical complication of other cardiac and vascular devices and implants, initial encounter: Secondary | ICD-10-CM | POA: Diagnosis not present

## 2023-10-25 DIAGNOSIS — Z452 Encounter for adjustment and management of vascular access device: Secondary | ICD-10-CM | POA: Diagnosis not present

## 2023-10-25 DIAGNOSIS — C163 Malignant neoplasm of pyloric antrum: Secondary | ICD-10-CM | POA: Insufficient documentation

## 2023-10-25 DIAGNOSIS — I82210 Acute embolism and thrombosis of superior vena cava: Secondary | ICD-10-CM | POA: Insufficient documentation

## 2023-10-25 HISTORY — PX: IR CV LINE INJECTION: IMG2294

## 2023-10-25 MED ORDER — HEPARIN SOD (PORK) LOCK FLUSH 100 UNIT/ML IV SOLN
INTRAVENOUS | Status: AC
  Filled 2023-10-25: qty 5

## 2023-10-25 MED ORDER — IOHEXOL 300 MG/ML  SOLN: 8.0000 mL | Freq: Once | INTRAMUSCULAR | Status: DC | PRN

## 2023-10-26 ENCOUNTER — Other Ambulatory Visit: Payer: Self-pay

## 2023-10-26 ENCOUNTER — Encounter (HOSPITAL_COMMUNITY): Payer: Self-pay

## 2023-10-26 ENCOUNTER — Emergency Department (HOSPITAL_COMMUNITY)
Admission: EM | Admit: 2023-10-26 | Discharge: 2023-10-26 | Disposition: A | Discharge status: Home / Self Care | Attending: Emergency Medicine | Admitting: Emergency Medicine

## 2023-10-26 DIAGNOSIS — K5641 Fecal impaction: Secondary | ICD-10-CM | POA: Insufficient documentation

## 2023-10-26 DIAGNOSIS — Z85028 Personal history of other malignant neoplasm of stomach: Secondary | ICD-10-CM | POA: Diagnosis not present

## 2023-10-26 DIAGNOSIS — Z7901 Long term (current) use of anticoagulants: Secondary | ICD-10-CM | POA: Diagnosis not present

## 2023-10-26 DIAGNOSIS — R55 Syncope and collapse: Secondary | ICD-10-CM | POA: Diagnosis not present

## 2023-10-26 DIAGNOSIS — K5900 Constipation, unspecified: Secondary | ICD-10-CM | POA: Diagnosis present

## 2023-10-26 LAB — COMPREHENSIVE METABOLIC PANEL WITH GFR
ALT: 16 U/L (ref 0–44)
AST: 25 U/L (ref 15–41)
Albumin: 3.4 g/dL — ABNORMAL LOW (ref 3.5–5.0)
Alkaline Phosphatase: 46 U/L (ref 38–126)
Anion gap: 15 (ref 5–15)
BUN: 21 mg/dL (ref 8–23)
CO2: 21 mmol/L — ABNORMAL LOW (ref 22–32)
Calcium: 9.1 mg/dL (ref 8.9–10.3)
Chloride: 96 mmol/L — ABNORMAL LOW (ref 98–111)
Creatinine, Ser: 1.11 mg/dL (ref 0.61–1.24)
GFR, Estimated: >60 mL/min (ref >60)
Glucose, Bld: 106 mg/dL — ABNORMAL HIGH (ref 70–99)
Potassium: 3.7 mmol/L (ref 3.5–5.1)
Sodium: 132 mmol/L — ABNORMAL LOW (ref 135–145)
Total Bilirubin: 0.3 mg/dL (ref 0.0–1.2)
Total Protein: 7.2 g/dL (ref 6.5–8.1)

## 2023-10-26 LAB — I-STAT CHEM 8, ED
BUN: 25 mg/dL — ABNORMAL HIGH (ref 8–23)
Calcium, Ion: 1.08 mmol/L — ABNORMAL LOW (ref 1.15–1.40)
Chloride: 98 mmol/L (ref 98–111)
Creatinine, Ser: 1.1 mg/dL (ref 0.61–1.24)
Glucose, Bld: 107 mg/dL — ABNORMAL HIGH (ref 70–99)
HCT: 33 % — ABNORMAL LOW (ref 39.0–52.0)
Hemoglobin: 11.2 g/dL — ABNORMAL LOW (ref 13.0–17.0)
Potassium: 3.9 mmol/L (ref 3.5–5.1)
Sodium: 133 mmol/L — ABNORMAL LOW (ref 135–145)
TCO2: 23 mmol/L (ref 22–32)

## 2023-10-26 LAB — CBC
HCT: 32.4 % — ABNORMAL LOW (ref 39.0–52.0)
Hemoglobin: 9.9 g/dL — ABNORMAL LOW (ref 13.0–17.0)
MCH: 28.7 pg (ref 26.0–34.0)
MCHC: 30.6 g/dL (ref 30.0–36.0)
MCV: 93.9 fL (ref 80.0–100.0)
Platelets: 452 K/uL — ABNORMAL HIGH (ref 150–400)
RBC: 3.45 MIL/uL — ABNORMAL LOW (ref 4.22–5.81)
RDW: 15.5 % (ref 11.5–15.5)
WBC: 11.5 K/uL — ABNORMAL HIGH (ref 4.0–10.5)
nRBC: 0 % (ref 0.0–0.2)

## 2023-10-26 MED ORDER — LIDOCAINE HCL URETHRAL/MUCOSAL 2 % EX GEL
1.0000 | Freq: Once | CUTANEOUS | Status: DC
Start: 2023-10-26 — End: 2023-10-26

## 2023-10-26 MED ORDER — FLEET ENEMA RE ENEM
1.0000 | ENEMA | Freq: Once | RECTAL | Status: AC
  Administered 2023-10-26: 1 via RECTAL
  Filled 2023-10-26: qty 1

## 2023-10-26 NOTE — ED Notes (Signed)
 Pt experienced a vagal response while using bedside commode. Pt was able to successfully pass the blockage. Wife assisted pt to his knees. Staff assisted patient back to bed. Pt did not fall and denied injury. Repeat V/S and EKG obtained.

## 2023-10-26 NOTE — ED Provider Notes (Signed)
  EMERGENCY DEPARTMENT AT [facility] Provider Note   CSN: [id number] Arrival date & time: 10/26/23  9052     Patient presents with: Constipation and Nausea   Jon Williams is a 73 y.o. male with a history of stage II gastric cancer, presented to ED with complaint of constipation.  Patient reports his last bowel movement was 2 days ago.  He says he feels that he has a stool ball near his rectum that he cannot push out.  He tried over-the-counter medications with little relief last night but did not use a suppository and enema.  He denies nausea or vomiting.  He denies belching.  He denies abdominal distention or bloating.  He says the pressure is all in his rectum.  He has been on chronic opioid medicines for pain.  No changes to those medications.  He is passing gas and has some liquid bowel movement.  He is here with his wife.   HPI     Prior to Admission medications   Medication Sig Start Date End Date Taking? Authorizing Provider  Accu-Chek Softclix Lancets lancets Use to check your blood sugar finger stick once a day Dx Code E11.9 90 days 05/27/23     amiodarone  (PACERONE ) 200 MG tablet Take 1 tablet (200 mg total) by mouth 2 (two) times daily for 14 days, THEN 1 tablet (200 mg total) daily. 09/16/23 03/28/24  Glena Harlene HERO, FNP  atorvastatin  (LIPITOR) 40 MG tablet Take 1 tablet (40 mg total) by mouth daily. 05/20/23   Glena Harlene HERO, FNP  Biotin 1000 MCG tablet Take 1,000 mcg by mouth daily with breakfast.    [provider]  Blood Glucose Monitoring Suppl (ACCU-CHEK GUIDE) w/Device KIT as directed to check blood sugar once a day 05/27/23     cetirizine  (ZYRTEC ) 10 MG tablet Take 1 tablet (10 mg total) by mouth daily. 08/30/23   Mannie Fairy DASEN, DO  cholecalciferol  (VITAMIN D3) 25 MCG (1000 UT) tablet Take 1,000 Units by mouth daily with breakfast.    [provider]  clotrimazole -betamethasone  (LOTRISONE ) cream Apply 1 Application topically  2 (two) times daily. 09/19/23   [provider]  clotrimazole -betamethasone  (LOTRISONE ) cream Apply a thin layer topically 2 (two) times daily for 1 to 2 weeks. 09/19/23     digoxin  (LANOXIN ) 0.125 MG tablet Take 1/2 tablet (0.0625mg  total) by mouth daily. 03/15/23   Milford, Harlene HERO, FNP  empagliflozin  (JARDIANCE ) 10 MG TABS tablet Take 1 tablet (10 mg total) by mouth daily. 09/12/23     EPINEPHrine  0.3 mg/0.3 mL IJ SOAJ injection Inject 0.3 mg into the muscle as needed for anaphylaxis. 07/31/23   [name] Orlan HERO, DO  ferrous sulfate 325 (65 FE) MG tablet Take 325 mg by mouth daily with breakfast.    [provider]  furosemide  (LASIX ) 40 MG tablet Take 1 tablet (40 mg total) by mouth every other day. Alternating with 20 mg. 10/15/23   Marcine Catalan M, PA-C  gabapentin  (NEURONTIN ) 100 MG capsule Take 1 capsule (100 mg total) by mouth 2 (two) times daily. 09/18/23   Hanford Powell BRAVO, NP  glucose blood (ACCU-CHEK GUIDE TEST) test strip Use to check blood sugars once daily. 05/27/23   [name], MD  ipratropium (ATROVENT ) 0.03 % nasal spray Place 1-2 sprays into both nostrils 2 (two) times daily as needed (nasal drainage). 07/31/23   [name] Orlan HERO, DO  lidocaine -prilocaine  (EMLA ) cream Apply 1 Application topically as needed. 04/03/23   Lanny,  Onita, MD  metFORMIN  (GLUCOPHAGE -XR) 500 MG 24 hr tablet Take 1 tablet (500 mg total) by mouth daily with evening meal. 09/23/23     metoprolol  succinate (TOPROL -XL) 25 MG 24 hr tablet Take 1 tablet (25 mg total) by mouth daily. 03/15/23   Milford, Harlene HERO, FNP  multivitamin (ONE-A-DAY MEN'S) TABS tablet Take 1 tablet by mouth daily with breakfast.    [provider]  pantoprazole  (PROTONIX ) 40 MG tablet Take 1 tablet (40 mg total) by mouth daily. 09/30/23 09/29/24  Odell Celinda Balo, MD  pantoprazole  (PROTONIX ) 40 MG tablet Take 1 tablet (40 mg total) by mouth 2 (two) times daily. 09/30/23   Odell Celinda Balo, MD  potassium chloride   (KLOR-CON ) 10 MEQ tablet Take 2 tablets (20 mEq total) by mouth daily. Patient taking differently: Take 10 mEq by mouth 2 (two) times daily. 08/12/23   Milford, Harlene HERO, FNP  rivaroxaban  (XARELTO ) 20 MG TABS tablet Take 1 tablet (20 mg total) by mouth daily with supper. 03/15/23   Milford, Harlene HERO, FNP  spironolactone  (ALDACTONE ) 25 MG tablet Take 0.5 tablets (12.5 mg total) by mouth daily. 10/15/23   Marcine Catalan M, PA-C  sucralfate  (CARAFATE ) 1 GM/10ML suspension Take 10 mLs (1 g total) by mouth 4 (four) times daily -  with meals and at bedtime. 10/08/23   Burton, Lacie K, NP  traMADol  (ULTRAM ) 50 MG tablet Take 1 tablet (50 mg total) by mouth every 6 (six) hours as needed. 10/08/23   Burton, Lacie K, NP  valsartan  (DIOVAN ) 40 MG tablet Take 0.5 tablets (20 mg total) by mouth 2 (two) times daily. 10/15/23   Marcine Catalan M, PA-C  prochlorperazine  (COMPAZINE ) 10 MG tablet Take 1 tablet (10 mg total) by mouth every 6 (six) hours as needed (Nausea or vomiting). 02/15/21 05/12/21  Lanny Onita, MD    Allergies: Aspirin , Sacubitril -valsartan , Sulfa antibiotics, and Ace inhibitors    Review of Systems  Updated Vital Signs BP 111/72   Pulse 64   Temp (!) 97.2 F (36.2 C) (Oral)   Resp 17   SpO2 100%   Physical Exam Constitutional:      General: He is not in acute distress. HENT:     Head: Normocephalic and atraumatic.  Eyes:     Conjunctiva/sclera: Conjunctivae normal.     Pupils: Pupils are equal, round, and reactive to light.  Cardiovascular:     Rate and Rhythm: Normal rate and regular rhythm.  Pulmonary:     Effort: Pulmonary effort is normal. No respiratory distress.  Abdominal:     General: There is no distension.     Tenderness: There is no abdominal tenderness.  Genitourinary:    Comments: Soft stool ball noted on digital rectal exam Skin:    General: Skin is warm and dry.  Neurological:     General: No focal deficit present.     Mental Status: He is alert. Mental  status is at baseline.  Psychiatric:        Mood and Affect: Mood normal.        Behavior: Behavior normal.     (all labs ordered are listed, but only abnormal results are displayed) Labs Reviewed  COMPREHENSIVE METABOLIC PANEL WITH GFR - Abnormal; Notable for the following components:      Result Value   Sodium 132 (*)    Chloride 96 (*)    CO2 21 (*)    Glucose, Bld 106 (*)    Albumin 3.4 (*)    All  other components within normal limits  CBC - Abnormal; Notable for the following components:   WBC 11.5 (*)    RBC 3.45 (*)    Hemoglobin 9.9 (*)    HCT 32.4 (*)    Platelets 452 (*)    All other components within normal limits  I-STAT CHEM 8, ED - Abnormal; Notable for the following components:   Sodium 133 (*)    BUN 25 (*)    Glucose, Bld 107 (*)    Calcium , Ion 1.08 (*)    Hemoglobin 11.2 (*)    HCT 33.0 (*)    All other components within normal limits    EKG: None  Radiology: IR CV Line Injection Result Date: 10/25/2023 CLINICAL DATA:  Right chest port catheter placed 12/23/2020 surgically, has worked well. More recently, recurrent difficulty aspirating from port catheter. Infuses easily. EXAM: PORT  CATHETER INJECTION UNDER FLUOROSCOPY TECHNIQUE: The procedure, risks (including but not limited to bleeding, infection, organ damage ), benefits, and alternatives were explained to the patient. Questions regarding the procedure were encouraged and answered. The patient understands and consents to the procedure. Survey fluoroscopic inspection reveals stable position of right subclavian port catheter, tip proximal SVC. Stable left subclavian AICD leads. Injection demonstrates patency of the port with no leak. The SVC is occluded just distal to the confluence of the brachiocephalic veins. Injected contrast flows retrograde up the right brachiocephalic vein and drains via body wall collaterals. IMPRESSION: 1. Proximal SVC occlusion. 2. Port catheter extends to the distal right  brachiocephalic vein, with retrograde flow draining via body wall collaterals. Electronically Signed   By: JONETTA Faes M.D.   On: 10/25/2023 18:00     Procedures   Medications Ordered in the ED  lidocaine  (XYLOCAINE ) 2 % jelly 1 Application (1 Application Topical Not Given 10/26/23 1305)  sodium phosphate  (FLEET) enema 1 enema (1 enema Rectal Given 10/26/23 1147)    Clinical Course as of 10/26/23 1502  Sat Oct 26, 2023  1314 Patient had a large bowel movement and I suspect he had a vasovagal episode afterwards, we became lightheaded and diaphoretic.  He was helped back into his bed.  He is feeling better now.  We will continue to monitor his vital signs  [MT]  1314 No head injury reported [MT]  1502 Patient was able to ambulate steadily in the ED.  Blood pressure is stable.  He is asymptomatic.  Stable for discharge [MT]    Clinical Course User Index [MT] Kushal Saunders, Donnice PARAS, MD                                 Medical Decision Making Amount and/or Complexity of Data Reviewed Labs: ordered.  Risk OTC drugs.   This patient presents to the ED with concern for rectal fullness, constipation. This involves an extensive number of treatment options, and is a complaint that carries with it a high risk of complications and morbidity.  The differential diagnosis includes impaction versus constipation due to medication side effect versus other  Low suspicion for bowel obstruction, ileus, GI perforation or other intra-abdominal surgical or infectious emergency.  No indication for CT imaging of at this time.  Benign abdominal exam  Co-morbidities that complicate the patient evaluation: Chronic opioid use at high risk constipation  Additional history obtained from the patient's wife at bedside  External records from outside source obtained and reviewed including oncology office records  I ordered  and personally interpreted labs.  The pertinent results include: No emergent findings.  Labs are  stable baseline levels. Mild hyponatremia and stable anemia.  I ordered medication including Fleet enema and Uro-Jet for suspected fecal impaction I have reviewed the patients home medicines and have made adjustments as needed   After the interventions noted above, I reevaluated the patient and found that they have: improved -patient had a large bowel movement and feels much better.   Disposition:  After consideration of the diagnostic results and the patients response to treatment, I feel that the patent would benefit from no emergent findings      Final diagnoses:  Fecal impaction [facility])  Vasovagal episode    ED Discharge Orders     None          Cottie Donnice PARAS, MD 10/26/23 [phone]

## 2023-10-26 NOTE — ED Triage Notes (Addendum)
 Pt came in via POV d/t 3 months of feeling constipated. 2 days ago was his last BM, states it was good but has a Hx of constipation & he has been having infrequent normal BMs. A/Ox4, has had some abd pain with nausea since last night so came in for eval.

## 2023-10-28 ENCOUNTER — Encounter: Payer: Self-pay | Admitting: Hematology

## 2023-10-28 ENCOUNTER — Encounter: Payer: Self-pay | Admitting: Nurse Practitioner

## 2023-10-28 ENCOUNTER — Other Ambulatory Visit (HOSPITAL_COMMUNITY): Payer: Self-pay | Admitting: Interventional Radiology

## 2023-10-28 ENCOUNTER — Inpatient Hospital Stay

## 2023-10-28 ENCOUNTER — Other Ambulatory Visit (HOSPITAL_COMMUNITY): Payer: Self-pay

## 2023-10-28 ENCOUNTER — Other Ambulatory Visit (HOSPITAL_COMMUNITY): Payer: Self-pay | Admitting: Diagnostic Radiology

## 2023-10-28 ENCOUNTER — Inpatient Hospital Stay: Admitting: Nurse Practitioner

## 2023-10-28 VITALS — BP 110/60 | HR 66 | Temp 98.1°F | Resp 17 | Wt 151.4 lb

## 2023-10-28 DIAGNOSIS — C163 Malignant neoplasm of pyloric antrum: Secondary | ICD-10-CM

## 2023-10-28 DIAGNOSIS — Z5112 Encounter for antineoplastic immunotherapy: Secondary | ICD-10-CM | POA: Diagnosis not present

## 2023-10-28 DIAGNOSIS — C61 Malignant neoplasm of prostate: Secondary | ICD-10-CM

## 2023-10-28 DIAGNOSIS — C169 Malignant neoplasm of stomach, unspecified: Secondary | ICD-10-CM

## 2023-10-28 DIAGNOSIS — D509 Iron deficiency anemia, unspecified: Secondary | ICD-10-CM

## 2023-10-28 DIAGNOSIS — R1013 Epigastric pain: Secondary | ICD-10-CM | POA: Diagnosis not present

## 2023-10-28 DIAGNOSIS — Z79620 Long term (current) use of immunosuppressive biologic: Secondary | ICD-10-CM | POA: Diagnosis not present

## 2023-10-28 LAB — CMP (CANCER CENTER ONLY)
ALT: 14 U/L (ref 0–44)
AST: 17 U/L (ref 15–41)
Albumin: 3.9 g/dL (ref 3.5–5.0)
Alkaline Phosphatase: 46 U/L (ref 38–126)
Anion gap: 9 (ref 5–15)
BUN: 28 mg/dL — ABNORMAL HIGH (ref 8–23)
CO2: 25 mmol/L (ref 22–32)
Calcium: 9.3 mg/dL (ref 8.9–10.3)
Chloride: 103 mmol/L (ref 98–111)
Creatinine: 1.14 mg/dL (ref 0.61–1.24)
GFR, Estimated: >60 mL/min (ref >60)
Glucose, Bld: 117 mg/dL — ABNORMAL HIGH (ref 70–99)
Potassium: 4.2 mmol/L (ref 3.5–5.1)
Sodium: 137 mmol/L (ref 135–145)
Total Bilirubin: 0.5 mg/dL (ref 0.0–1.2)
Total Protein: 7.3 g/dL (ref 6.5–8.1)

## 2023-10-28 LAB — CBC WITH DIFFERENTIAL (CANCER CENTER ONLY)
Abs Immature Granulocytes: 0.04 K/uL (ref 0.00–0.07)
Basophils Absolute: 0 K/uL (ref 0.0–0.1)
Basophils Relative: 0 %
Eosinophils Absolute: 0.1 K/uL (ref 0.0–0.5)
Eosinophils Relative: 1 %
HCT: 32.5 % — ABNORMAL LOW (ref 39.0–52.0)
Hemoglobin: 10.2 g/dL — ABNORMAL LOW (ref 13.0–17.0)
Immature Granulocytes: 0 %
Lymphocytes Relative: 6 %
Lymphs Abs: 0.6 K/uL — ABNORMAL LOW (ref 0.7–4.0)
MCH: 28.5 pg (ref 26.0–34.0)
MCHC: 31.4 g/dL (ref 30.0–36.0)
MCV: 90.8 fL (ref 80.0–100.0)
Monocytes Absolute: 0.7 K/uL (ref 0.1–1.0)
Monocytes Relative: 7 %
Neutro Abs: 8.6 K/uL — ABNORMAL HIGH (ref 1.7–7.7)
Neutrophils Relative %: 86 %
Platelet Count: 370 K/uL (ref 150–400)
RBC: 3.58 MIL/uL — ABNORMAL LOW (ref 4.22–5.81)
RDW: 15.6 % — ABNORMAL HIGH (ref 11.5–15.5)
WBC Count: 10 K/uL (ref 4.0–10.5)
nRBC: 0 % (ref 0.0–0.2)

## 2023-10-28 LAB — FERRITIN: Ferritin: 51 ng/mL (ref 24–336)

## 2023-10-28 LAB — TSH: TSH: 1.44 u[IU]/mL (ref 0.350–4.500)

## 2023-10-28 MED ORDER — LIDOCAINE-PRILOCAINE 2.5-2.5 % EX CREA
TOPICAL_CREAM | CUTANEOUS | 3 refills | Status: AC
  Filled 2023-10-28: qty 30, 30d supply, fill #0
  Filled 2023-11-22: qty 30, 30d supply, fill #1
  Filled 2023-12-23: qty 30, 30d supply, fill #2

## 2023-10-28 MED ORDER — FAMOTIDINE IN NACL 20-0.9 MG/50ML-% IV SOLN
20.0000 mg | Freq: Once | INTRAVENOUS | Status: AC
  Administered 2023-10-28: 20 mg via INTRAVENOUS
  Filled 2023-10-28: qty 50

## 2023-10-28 MED ORDER — SODIUM CHLORIDE 0.9 % IV SOLN
1.0000 mg/kg | Freq: Once | INTRAVENOUS | Status: AC
  Administered 2023-10-28: 70 mg via INTRAVENOUS
  Filled 2023-10-28: qty 14

## 2023-10-28 MED ORDER — ONDANSETRON HCL 8 MG PO TABS
8.0000 mg | ORAL_TABLET | Freq: Three times a day (TID) | ORAL | 1 refills | Status: AC | PRN
Start: 2023-10-28 — End: ?
  Filled 2023-10-28: qty 30, 10d supply, fill #0

## 2023-10-28 MED ORDER — MORPHINE SULFATE (PF) 2 MG/ML IV SOLN
1.0000 mg | Freq: Once | INTRAVENOUS | Status: AC
  Administered 2023-10-28: 1 mg via INTRAVENOUS
  Filled 2023-10-28: qty 1

## 2023-10-28 MED ORDER — DIPHENHYDRAMINE HCL 50 MG/ML IJ SOLN
25.0000 mg | Freq: Once | INTRAMUSCULAR | Status: AC
  Administered 2023-10-28: 25 mg via INTRAVENOUS
  Filled 2023-10-28: qty 1

## 2023-10-28 MED ORDER — PROCHLORPERAZINE MALEATE 10 MG PO TABS
10.0000 mg | ORAL_TABLET | Freq: Four times a day (QID) | ORAL | 1 refills | Status: AC | PRN
  Filled 2023-10-28: qty 30, 8d supply, fill #0

## 2023-10-28 MED ORDER — SODIUM CHLORIDE 0.9 % IV SOLN: INTRAVENOUS | Status: DC

## 2023-10-28 MED ORDER — SODIUM CHLORIDE 0.9 % IV SOLN
240.0000 mg | Freq: Once | INTRAVENOUS | Status: AC
  Administered 2023-10-28: 240 mg via INTRAVENOUS
  Filled 2023-10-28: qty 24

## 2023-10-28 NOTE — Patient Instructions (Signed)
 [facility] - A DEPT OF [facility]  Discharge Instructions: Thank you for choosing [facility] to provide your oncology and hematology care.   If you have a lab appointment with the Cancer Center, please go directly to the Cancer Center and check in at the registration area.   Wear comfortable clothing and clothing appropriate for easy access to any Portacath or PICC line.   We strive to give you quality time with your provider. You may need to reschedule your appointment if you arrive late (15 or more minutes).  Arriving late affects you and other patients whose appointments are after yours.  Also, if you miss three or more appointments without notifying the office, you may be dismissed from the clinic at the provider's discretion.      For prescription refill requests, have your pharmacy contact our office and allow 72 hours for refills to be completed.    Today you received the following chemotherapy and/or immunotherapy agents Opdivo, Yervoy.      To help prevent nausea and vomiting after your treatment, we encourage you to take your nausea medication as directed.  BELOW ARE SYMPTOMS THAT SHOULD BE REPORTED IMMEDIATELY: *FEVER GREATER THAN 100.4 F (38 C) OR HIGHER *CHILLS OR SWEATING *NAUSEA AND VOMITING THAT IS NOT CONTROLLED WITH YOUR NAUSEA MEDICATION *UNUSUAL SHORTNESS OF BREATH *UNUSUAL BRUISING OR BLEEDING *URINARY PROBLEMS (pain or burning when urinating, or frequent urination) *BOWEL PROBLEMS (unusual diarrhea, constipation, pain near the anus) TENDERNESS IN MOUTH AND THROAT WITH OR WITHOUT PRESENCE OF ULCERS (sore throat, sores in mouth, or a toothache) UNUSUAL RASH, SWELLING OR PAIN  UNUSUAL VAGINAL DISCHARGE OR ITCHING   Items with * indicate a potential emergency and should be followed up as soon as possible or go to the Emergency Department if any problems should occur.  Please show the CHEMOTHERAPY ALERT CARD or  IMMUNOTHERAPY ALERT CARD at check-in to the Emergency Department and triage nurse.  Should you have questions after your visit or need to cancel or reschedule your appointment, please contact [facility] - A DEPT OF [facility]  Dept: [phone]  and follow the prompts.  Office hours are 8:00 a.m. to 4:30 p.m. Monday - Friday. Please note that voicemails left after 4:00 p.m. may not be returned until the following business day.  We are closed weekends and major holidays. You have access to a nurse at all times for urgent questions. Please call the main number to the clinic Dept: [phone] and follow the prompts.   For any non-urgent questions, you may also contact your provider using MyChart. We now offer e-Visits for anyone 77 and older to request care online for non-urgent symptoms. For details visit [email or web address].   Also download the MyChart app! Go to the app store, search "MyChart", open the app, select [facility], and log in with your MyChart username and password.

## 2023-10-28 NOTE — Progress Notes (Signed)
 Ipilimumab  (YERVOY ) Patient Monitoring Assessment   Is the patient experiencing any of the following general symptoms?:  [x] Patient is not experiencing any of the general symptoms listed in this section.  [] Difficulty performing normal activities [] Feeling sluggish or cold all the time [] Unusual weight gain [] Constant or unusual headaches [] Feeling dizzy or faint [] Changes in eyesight (blurry vision, double vision, or other vision problems) [] Changes in mood or behavior (ex: decreased sex drive, irritability, or forgetfulness) [] Starting new medications (ex: steroids, other medications that lower immune response)   Gastrointestinal  Patient is having 1 bowel movements each day.  Is this different from baseline? [x] Yes [] No Are your stools watery or do they have a foul smell? [] Yes [] No Have you seen blood in your stools? [] Yes [x] No Are your stools dark, tarry, or sticky? [] Yes [x] No Are you having pain or tenderness in your belly? [] Yes [x] No  Skin Does your skin itch? [] Yes [x] No Do you have a rash? [] Yes [x] No Has your skin blistered and/or peeled? [] Yes [x] No Do you have sores in your mouth? [] Yes [x] No  Hepatic Has your urine been dark or tea colored? [] Yes [x] No Have you noticed your skin or the whites of your eyes are turning yellow? [] Yes [x] No Are you bleeding or bruising more easily than normal? [] Yes [x] No Are you nauseous and/or vomiting? [] Yes [x] No Do you have pain on the right side of your stomach? [] Yes [x] No  Neurologic  Are you having unusual weakness of legs, arms, or face? [] Yes [x] No Are you having numbness or tingling in your hands or feet? [] Yes [x] No  Jon Williams

## 2023-10-28 NOTE — Progress Notes (Signed)
 Pt here is for port flush and labs. Upon patient coming back to flush room, patient's support person reported that there has been difficulty getting blood return the last few visits. Pt went to radiology on Friday but was told that the doctor would need to speak to Dr. Lanny before they did any procedures. This nurse examined port and observed what looked to be possibly a vessel or wire crossing diagonally over port site. This nurse called for Ronal RAMAN, RN4 to assess. She advised to not access port and reported to Irena, NP her findings. Patient and support person sent back to blue waiting area for provider to see them. PAC not accessed as advised. Pt checked in for appointment with provider.

## 2023-10-28 NOTE — Progress Notes (Signed)
Remote ICD Transmission.

## 2023-10-29 ENCOUNTER — Other Ambulatory Visit: Payer: Self-pay

## 2023-10-29 ENCOUNTER — Other Ambulatory Visit (HOSPITAL_COMMUNITY): Payer: Self-pay

## 2023-10-29 DIAGNOSIS — I5042 Chronic combined systolic (congestive) and diastolic (congestive) heart failure: Secondary | ICD-10-CM | POA: Diagnosis not present

## 2023-10-29 DIAGNOSIS — E1122 Type 2 diabetes mellitus with diabetic chronic kidney disease: Secondary | ICD-10-CM | POA: Diagnosis not present

## 2023-10-29 DIAGNOSIS — I4892 Unspecified atrial flutter: Secondary | ICD-10-CM | POA: Diagnosis not present

## 2023-10-29 DIAGNOSIS — I482 Chronic atrial fibrillation, unspecified: Secondary | ICD-10-CM | POA: Diagnosis not present

## 2023-10-29 DIAGNOSIS — E1165 Type 2 diabetes mellitus with hyperglycemia: Secondary | ICD-10-CM | POA: Diagnosis not present

## 2023-10-29 DIAGNOSIS — I130 Hypertensive heart and chronic kidney disease with heart failure and stage 1 through stage 4 chronic kidney disease, or unspecified chronic kidney disease: Secondary | ICD-10-CM | POA: Diagnosis not present

## 2023-10-29 DIAGNOSIS — N179 Acute kidney failure, unspecified: Secondary | ICD-10-CM | POA: Diagnosis not present

## 2023-10-29 DIAGNOSIS — N1831 Chronic kidney disease, stage 3a: Secondary | ICD-10-CM | POA: Diagnosis not present

## 2023-10-29 DIAGNOSIS — E1142 Type 2 diabetes mellitus with diabetic polyneuropathy: Secondary | ICD-10-CM | POA: Diagnosis not present

## 2023-10-29 LAB — T4: T4, Total: 14.3 ug/dL — ABNORMAL HIGH (ref 4.5–12.0)

## 2023-10-30 ENCOUNTER — Other Ambulatory Visit (HOSPITAL_COMMUNITY): Payer: Self-pay

## 2023-10-30 ENCOUNTER — Other Ambulatory Visit: Payer: Self-pay

## 2023-10-30 ENCOUNTER — Telehealth: Payer: Self-pay

## 2023-10-30 DIAGNOSIS — I428 Other cardiomyopathies: Secondary | ICD-10-CM | POA: Diagnosis not present

## 2023-10-30 DIAGNOSIS — C169 Malignant neoplasm of stomach, unspecified: Secondary | ICD-10-CM | POA: Diagnosis not present

## 2023-10-30 DIAGNOSIS — I959 Hypotension, unspecified: Secondary | ICD-10-CM | POA: Diagnosis not present

## 2023-10-30 DIAGNOSIS — K5900 Constipation, unspecified: Secondary | ICD-10-CM | POA: Diagnosis not present

## 2023-10-30 NOTE — Telephone Encounter (Signed)
-----   Message from Nurse Selinda E sent at 10/28/2023 12:49 PM EDT ----- Regarding: first time treatment call back, Feng, Opdivo / Yervoy  Patient received a new treatment today. Did very well with no issues. Opdivo / Yervoy

## 2023-10-30 NOTE — Telephone Encounter (Signed)
 LM for patient that this nurse was calling to see how they were doing after their treatment. Please call back to Dr. Latanya Maudlin nurse at [phone] if they have any questions or concerns regarding the treatment.

## 2023-10-31 ENCOUNTER — Other Ambulatory Visit (HOSPITAL_COMMUNITY): Payer: Self-pay | Admitting: Family Medicine

## 2023-10-31 ENCOUNTER — Other Ambulatory Visit (HOSPITAL_COMMUNITY): Payer: Self-pay

## 2023-10-31 MED ORDER — DIGOXIN 125 MCG PO TABS
0.0625 mg | ORAL_TABLET | Freq: Every day | ORAL | 6 refills | Status: AC
  Filled 2023-11-11: qty 15, 30d supply, fill #0
  Filled 2023-12-06: qty 15, 30d supply, fill #1
  Filled 2024-01-15: qty 15, 30d supply, fill #2
  Filled 2024-02-10 (×2): qty 15, 30d supply, fill #3
  Filled 2024-03-13: qty 15, 30d supply, fill #4

## 2023-11-01 DIAGNOSIS — E1165 Type 2 diabetes mellitus with hyperglycemia: Secondary | ICD-10-CM | POA: Diagnosis not present

## 2023-11-01 DIAGNOSIS — I130 Hypertensive heart and chronic kidney disease with heart failure and stage 1 through stage 4 chronic kidney disease, or unspecified chronic kidney disease: Secondary | ICD-10-CM | POA: Diagnosis not present

## 2023-11-01 DIAGNOSIS — N179 Acute kidney failure, unspecified: Secondary | ICD-10-CM | POA: Diagnosis not present

## 2023-11-01 DIAGNOSIS — I4892 Unspecified atrial flutter: Secondary | ICD-10-CM | POA: Diagnosis not present

## 2023-11-01 DIAGNOSIS — E1142 Type 2 diabetes mellitus with diabetic polyneuropathy: Secondary | ICD-10-CM | POA: Diagnosis not present

## 2023-11-01 DIAGNOSIS — I482 Chronic atrial fibrillation, unspecified: Secondary | ICD-10-CM | POA: Diagnosis not present

## 2023-11-01 DIAGNOSIS — I5042 Chronic combined systolic (congestive) and diastolic (congestive) heart failure: Secondary | ICD-10-CM | POA: Diagnosis not present

## 2023-11-01 DIAGNOSIS — E1122 Type 2 diabetes mellitus with diabetic chronic kidney disease: Secondary | ICD-10-CM | POA: Diagnosis not present

## 2023-11-01 DIAGNOSIS — N1831 Chronic kidney disease, stage 3a: Secondary | ICD-10-CM | POA: Diagnosis not present

## 2023-11-02 DIAGNOSIS — E1142 Type 2 diabetes mellitus with diabetic polyneuropathy: Secondary | ICD-10-CM | POA: Diagnosis not present

## 2023-11-02 DIAGNOSIS — I482 Chronic atrial fibrillation, unspecified: Secondary | ICD-10-CM | POA: Diagnosis not present

## 2023-11-02 DIAGNOSIS — I130 Hypertensive heart and chronic kidney disease with heart failure and stage 1 through stage 4 chronic kidney disease, or unspecified chronic kidney disease: Secondary | ICD-10-CM | POA: Diagnosis not present

## 2023-11-02 DIAGNOSIS — E1122 Type 2 diabetes mellitus with diabetic chronic kidney disease: Secondary | ICD-10-CM | POA: Diagnosis not present

## 2023-11-02 DIAGNOSIS — I5042 Chronic combined systolic (congestive) and diastolic (congestive) heart failure: Secondary | ICD-10-CM | POA: Diagnosis not present

## 2023-11-02 DIAGNOSIS — N1831 Chronic kidney disease, stage 3a: Secondary | ICD-10-CM | POA: Diagnosis not present

## 2023-11-02 DIAGNOSIS — E1165 Type 2 diabetes mellitus with hyperglycemia: Secondary | ICD-10-CM | POA: Diagnosis not present

## 2023-11-02 DIAGNOSIS — I4892 Unspecified atrial flutter: Secondary | ICD-10-CM | POA: Diagnosis not present

## 2023-11-03 ENCOUNTER — Other Ambulatory Visit (HOSPITAL_COMMUNITY): Payer: Self-pay | Admitting: Student

## 2023-11-04 ENCOUNTER — Other Ambulatory Visit (HOSPITAL_COMMUNITY): Payer: Self-pay | Admitting: Interventional Radiology

## 2023-11-04 ENCOUNTER — Other Ambulatory Visit: Payer: Self-pay

## 2023-11-04 ENCOUNTER — Encounter (HOSPITAL_COMMUNITY): Payer: Self-pay | Admitting: *Deleted

## 2023-11-04 ENCOUNTER — Ambulatory Visit (HOSPITAL_COMMUNITY)
Admission: RE | Admit: 2023-11-04 | Discharge: 2023-11-04 | Disposition: A | Discharge status: Home / Self Care | Source: Ambulatory Visit | Attending: Interventional Radiology | Admitting: Interventional Radiology

## 2023-11-04 ENCOUNTER — Ambulatory Visit (HOSPITAL_COMMUNITY): Admission: EM | Admit: 2023-11-04 | Discharge: 2023-11-04 | Disposition: A | Discharge status: Home / Self Care

## 2023-11-04 ENCOUNTER — Other Ambulatory Visit: Payer: Self-pay | Admitting: Nurse Practitioner

## 2023-11-04 ENCOUNTER — Other Ambulatory Visit (HOSPITAL_COMMUNITY): Payer: Self-pay

## 2023-11-04 DIAGNOSIS — R21 Rash and other nonspecific skin eruption: Secondary | ICD-10-CM

## 2023-11-04 DIAGNOSIS — C169 Malignant neoplasm of stomach, unspecified: Secondary | ICD-10-CM

## 2023-11-04 DIAGNOSIS — I4892 Unspecified atrial flutter: Secondary | ICD-10-CM | POA: Diagnosis not present

## 2023-11-04 DIAGNOSIS — N179 Acute kidney failure, unspecified: Secondary | ICD-10-CM | POA: Diagnosis not present

## 2023-11-04 DIAGNOSIS — N1831 Chronic kidney disease, stage 3a: Secondary | ICD-10-CM | POA: Diagnosis not present

## 2023-11-04 DIAGNOSIS — I5042 Chronic combined systolic (congestive) and diastolic (congestive) heart failure: Secondary | ICD-10-CM | POA: Diagnosis not present

## 2023-11-04 DIAGNOSIS — T82598A Other mechanical complication of other cardiac and vascular devices and implants, initial encounter: Secondary | ICD-10-CM | POA: Diagnosis not present

## 2023-11-04 DIAGNOSIS — I959 Hypotension, unspecified: Secondary | ICD-10-CM | POA: Diagnosis not present

## 2023-11-04 DIAGNOSIS — Z85028 Personal history of other malignant neoplasm of stomach: Secondary | ICD-10-CM | POA: Diagnosis not present

## 2023-11-04 DIAGNOSIS — I482 Chronic atrial fibrillation, unspecified: Secondary | ICD-10-CM | POA: Diagnosis not present

## 2023-11-04 DIAGNOSIS — I871 Compression of vein: Secondary | ICD-10-CM | POA: Insufficient documentation

## 2023-11-04 DIAGNOSIS — I130 Hypertensive heart and chronic kidney disease with heart failure and stage 1 through stage 4 chronic kidney disease, or unspecified chronic kidney disease: Secondary | ICD-10-CM | POA: Diagnosis not present

## 2023-11-04 DIAGNOSIS — Z452 Encounter for adjustment and management of vascular access device: Secondary | ICD-10-CM | POA: Diagnosis not present

## 2023-11-04 DIAGNOSIS — E1122 Type 2 diabetes mellitus with diabetic chronic kidney disease: Secondary | ICD-10-CM | POA: Diagnosis not present

## 2023-11-04 DIAGNOSIS — E1165 Type 2 diabetes mellitus with hyperglycemia: Secondary | ICD-10-CM | POA: Diagnosis not present

## 2023-11-04 DIAGNOSIS — E1142 Type 2 diabetes mellitus with diabetic polyneuropathy: Secondary | ICD-10-CM | POA: Diagnosis not present

## 2023-11-04 HISTORY — PX: IR VENOCAVAGRAM SVC: IMG679

## 2023-11-04 HISTORY — PX: IR ANGIOGRAM SELECTIVE EACH ADDITIONAL VESSEL: IMG667

## 2023-11-04 HISTORY — PX: IR US GUIDE VASC ACCESS RIGHT: IMG2390

## 2023-11-04 HISTORY — PX: IR REMOVAL TUN ACCESS W/ PORT W/O FL MOD SED: IMG2290

## 2023-11-04 LAB — GLUCOSE, CAPILLARY: Glucose-Capillary: 86 mg/dL (ref 70–99)

## 2023-11-04 MED ORDER — MIDAZOLAM HCL 2 MG/2ML IJ SOLN
INTRAMUSCULAR | Status: AC | PRN
  Administered 2023-11-04 (×2): 1 mg via INTRAVENOUS

## 2023-11-04 MED ORDER — CEFAZOLIN SODIUM-DEXTROSE 2-4 GM/100ML-% IV SOLN
INTRAVENOUS | Status: AC | PRN
Start: 2023-11-04 — End: 2023-11-04
  Administered 2023-11-04: 2 g via INTRAVENOUS

## 2023-11-04 MED ORDER — CEFAZOLIN SODIUM-DEXTROSE 2-4 GM/100ML-% IV SOLN
INTRAVENOUS | Status: AC
  Filled 2023-11-04: qty 100

## 2023-11-04 MED ORDER — HEPARIN SOD (PORK) LOCK FLUSH 100 UNIT/ML IV SOLN
INTRAVENOUS | Status: AC
  Filled 2023-11-04: qty 5

## 2023-11-04 MED ORDER — FENTANYL CITRATE (PF) 100 MCG/2ML IJ SOLN
INTRAMUSCULAR | Status: AC
  Filled 2023-11-04: qty 2

## 2023-11-04 MED ORDER — SUCRALFATE 1 GM/10ML PO SUSP: 1.0000 g | Freq: Three times a day (TID) | ORAL | 1 refills | Status: AC

## 2023-11-04 MED ORDER — LIDOCAINE HCL 1 % IJ SOLN
INTRAMUSCULAR | Status: AC
  Filled 2023-11-04: qty 20

## 2023-11-04 MED ORDER — MIDAZOLAM HCL 2 MG/2ML IJ SOLN
INTRAMUSCULAR | Status: AC
  Filled 2023-11-04: qty 2

## 2023-11-04 MED ORDER — IOHEXOL 300 MG/ML  SOLN
50.0000 mL | Freq: Once | INTRAMUSCULAR | Status: AC | PRN
  Administered 2023-11-04: 10 mL via INTRAVENOUS

## 2023-11-04 MED ORDER — LIDOCAINE HCL 1 % IJ SOLN
20.0000 mL | Freq: Once | INTRAMUSCULAR | Status: AC
  Administered 2023-11-04: 10 mL

## 2023-11-04 MED ORDER — SODIUM CHLORIDE 0.9 % IV SOLN: INTRAVENOUS | Status: DC

## 2023-11-04 MED ORDER — FENTANYL CITRATE (PF) 100 MCG/2ML IJ SOLN
INTRAMUSCULAR | Status: AC | PRN
  Administered 2023-11-04 (×2): 25 ug via INTRAVENOUS

## 2023-11-04 NOTE — Assessment & Plan Note (Signed)
-  diagnosed with Gleason 4+4 prostate cancer in early 2019. He was treated with long-term ADT in combination with 8 weeks of IMRT.

## 2023-11-04 NOTE — ED Notes (Addendum)
 Pt signed AMA form in documents

## 2023-11-04 NOTE — Progress Notes (Signed)
Patient and wife was given discharge instructions, Both verbalized understanding.

## 2023-11-04 NOTE — ED Triage Notes (Signed)
 Pt states he is doing chemo his last treatment was last Monday. The rash has been all over his body since Sunday. No meds have been used. Wife states oncology advised him to be seen.

## 2023-11-04 NOTE — H&P (Signed)
 Chief Complaint: Patient was seen in consultation today for port malfunction.  Referring Physician(s): Hassell,Daniel  Supervising Physician: Luverne Aran  Patient Status: [facility] - Out-pt  History of Present Illness: Jon Williams is a 73 y.o. male with a past medical history significant for CAD, CHF, non-ischemic cardiomyopathy, a.fib s/p AICD placement, HTN, LBBB, DM, prostate cancer and gastric cancer who presents today due to port malfunction. [name] underwent surgical port placement 12/23/2020 which had been working well until recently when it was noted to have recurrent difficulty aspirating, however it infused easily. He was seen by IR for port evaluation on 10/25/23 (Dr. Johann) which showed:  IMPRESSION: 1. Proximal SVC occlusion. 2. Port catheter extends to the distal right brachiocephalic vein, with retrograde flow draining via body wall collaterals.  He returns today for possible port revision and if unable to revise port removal with central line placement.  [name] seen in IR holding bay, he reports feeling well overall and is looking forward to getting his port fixed or to at least have plan of what to do next. He confirms the port infuses easily but they are unable to get blood from it. He planned to begin immunotherapy soon with oncology.   Past Medical History:  Diagnosis Date   AICD (automatic cardioverter/defibrillator) present 05/25/2016   biv icd   Angio-edema    Bunion, right    Chronic combined systolic and diastolic CHF (congestive heart failure) (HCC)    Echo 1/18: Mild conc LVH, EF 15-20, severe diff HK, inf and inf-septal AK, Gr 3 DD, mild to mod MR, severe LAE, mod reduced RVSF, mod RAE, mild TR, PASP 50   Coronary artery disease involving native coronary artery without angina pectoris 04/17/2016   LHC 1/18: pLCx 30, mLCx 20, mRCA 40, dRCA 20, LVEDP 23, mean RA 8, PA 42/20, PCWP 17   Diabetes mellitus without complication (HCC)    Gastric  cancer (HCC)    History of atrial fibrillation    History of atrial flutter    History of cardiomegaly 06/07/2016   Noted on CXR   History of colon polyps 06/28/2017   Noted on colonoscopy   Hypertension    LBBB (left bundle branch block)    Nausea vomiting and diarrhea 04/04/2021   NICM (nonischemic cardiomyopathy) (HCC)    Echo 1/18:  Mild conc LVH, EF 15-20, severe diff HK, inf and inf-septal AK, Gr 3 DD, mild to mod MR, severe LAE, mod reduced RVSF, mod RAE, mild TR, PASP 50   Other secondary pulmonary hypertension (HCC) 04/17/2016   Prostate cancer (HCC) 2019   Sigmoid diverticulosis 06/28/2017   Noted on colonoscopy    Past Surgical History:  Procedure Laterality Date   BIOPSY  12/01/2020   Procedure: BIOPSY;  Surgeon: Rollin Dover, MD;  Location: [facility] ENDOSCOPY;  Service: Endoscopy;;   BIOPSY  06/09/2021   Procedure: BIOPSY;  Surgeon: Rollin Dover, MD;  Location: [facility]OSCOPY;  Service: Gastroenterology;;   BIOPSY  06/29/2022   Procedure: BIOPSY;  Surgeon: Rollin Dover, MD;  Location: [facility] ENDOSCOPY;  Service: Gastroenterology;;   BIV ICD INSERTION CRT-D N/A 05/25/2016   Procedure: BiV ICD Insertion CRT-D;  Surgeon: Danelle LELON Birmingham, MD;  Location: [facility] INVASIVE CV LAB;  Service: Cardiovascular;  Laterality: N/A;   CARDIAC CATHETERIZATION N/A 03/02/2016   Procedure: Right/Left Heart Cath and Coronary Angiography;  Surgeon: Lonni Hanson, MD;  Location: [facility] INVASIVE CV LAB;  Service: Cardiovascular;  Laterality: N/A;   CARDIOVERSION N/A 07/17/2016  Procedure: Cardioversion;  Surgeon: Waddell Danelle ORN, MD;  Location: [facility] INVASIVE CV LAB;  Service: Cardiovascular;  Laterality: N/A;   COLONOSCOPY WITH PROPOFOL  N/A 06/28/2017   Procedure: COLONOSCOPY WITH PROPOFOL ;  Surgeon: Rollin Dover, MD;  Location: [facility] ENDOSCOPY;  Service: Endoscopy;  Laterality: N/A;   colonscopy  2009   ESOPHAGOGASTRODUODENOSCOPY N/A 12/01/2020   Procedure: ESOPHAGOGASTRODUODENOSCOPY (EGD);  Surgeon: Rollin Dover,  MD;  Location: [facility] ENDOSCOPY;  Service: Endoscopy;  Laterality: N/A;  IDA/guaiac positive stools   ESOPHAGOGASTRODUODENOSCOPY N/A 10/04/2023   Procedure: EGD (ESOPHAGOGASTRODUODENOSCOPY);  Surgeon: Rollin Dover, MD;  Location: [facility] ENDOSCOPY;  Service: Gastroenterology;  Laterality: N/A;   ESOPHAGOGASTRODUODENOSCOPY (EGD) WITH PROPOFOL  N/A 12/16/2020   Procedure: ESOPHAGOGASTRODUODENOSCOPY (EGD) WITH PROPOFOL ;  Surgeon: Rollin Dover, MD;  Location: [facility] ENDOSCOPY;  Service: Endoscopy;  Laterality: N/A;   ESOPHAGOGASTRODUODENOSCOPY (EGD) WITH PROPOFOL  N/A 06/09/2021   Procedure: ESOPHAGOGASTRODUODENOSCOPY (EGD) WITH PROPOFOL ;  Surgeon: Rollin Dover, MD;  Location: [facility] ENDOSCOPY;  Service: Gastroenterology;  Laterality: N/A;   ESOPHAGOGASTRODUODENOSCOPY (EGD) WITH PROPOFOL  N/A 06/29/2022   Procedure: ESOPHAGOGASTRODUODENOSCOPY (EGD) WITH PROPOFOL ;  Surgeon: Rollin Dover, MD;  Location: [facility] ENDOSCOPY;  Service: Gastroenterology;  Laterality: N/A;   GOLD SEED IMPLANT N/A 12/24/2017   Procedure: GOLD SEED IMPLANT, TRANSERINEAL;  Surgeon: Nieves Cough, MD;  Location: [facility] ORS;  Service: Urology;  Laterality: N/A;   INSERT / REPLACE / REMOVE PACEMAKER     IR CV LINE INJECTION  10/25/2023   LEAD REVISION  10/10/2018   LEAD REVISION/REPAIR N/A 10/10/2018   Procedure: LEAD REVISION/REPAIR;  Surgeon: Waddell Danelle ORN, MD;  Location: [facility] INVASIVE CV LAB;  Service: Cardiovascular;  Laterality: N/A;   POLYPECTOMY  06/28/2017   Procedure: POLYPECTOMY;  Surgeon: Rollin Dover, MD;  Location: [facility] ENDOSCOPY;  Service: Endoscopy;;  ascending and descending colon polyp   PORTACATH PLACEMENT Right 12/23/2020   Procedure: INSERTION PORT-A-CATH;  Surgeon: Dasie Leonor CROME, MD;  Location: [facility] OR;  Service: General;  Laterality: Right;   PROSTATE BIOPSY  02/20/2017   RIGHT HEART CATH N/A 07/25/2023   Procedure: RIGHT HEART CATH;  Surgeon: Rolan Ezra RAMAN, MD;  Location: [facility] INVASIVE CV LAB;  Service: Cardiovascular;  Laterality:  N/A;   RIGHT/LEFT HEART CATH AND CORONARY ANGIOGRAPHY N/A 10/30/2022   Procedure: RIGHT/LEFT HEART CATH AND CORONARY ANGIOGRAPHY;  Surgeon: Rolan Ezra RAMAN, MD;  Location: [facility] INVASIVE CV LAB;  Service: Cardiovascular;  Laterality: N/A;   SPACE OAR INSTILLATION N/A 12/24/2017   Procedure: SPACE OAR INSTILLATION;  Surgeon: Nieves Cough, MD;  Location: [facility] ORS;  Service: Urology;  Laterality: N/A;   TOTAL KNEE ARTHROPLASTY Right 08/16/2017   Procedure: RIGHT TOTAL KNEE ARTHROPLASTY;  Surgeon: Shari Sieving, MD;  Location: [facility] OR;  Service: Orthopedics;  Laterality: Right;   UPPER ESOPHAGEAL ENDOSCOPIC ULTRASOUND (EUS) N/A 12/16/2020   Procedure: UPPER ESOPHAGEAL ENDOSCOPIC ULTRASOUND (EUS);  Surgeon: Rollin Dover, MD;  Location: [facility]OSCOPY;  Service: Endoscopy;  Laterality: N/A;    Allergies: Aspirin , Sacubitril -valsartan , Sulfa antibiotics, and Ace inhibitors  Medications: Prior to Admission medications   Medication Sig Start Date End Date Taking? Authorizing Provider  amiodarone  (PACERONE ) 200 MG tablet Take 1 tablet (200 mg total) by mouth 2 (two) times daily for 14 days, THEN 1 tablet (200 mg total) daily. 09/16/23 03/28/24 Yes Milford, Harlene HERO, FNP  atorvastatin  (LIPITOR) 40 MG tablet Take 1 tablet (40 mg total) by mouth daily. 05/20/23  Yes Milford, Harlene HERO, FNP  Biotin 1000 MCG tablet Take 1,000 mcg by mouth daily with breakfast.   Yes [provider]  cholecalciferol  (VITAMIN D3) 25 MCG (1000 UT) tablet Take 1,000 Units by mouth daily with breakfast.   Yes [provider]  clotrimazole -betamethasone  (LOTRISONE ) cream Apply 1 Application topically 2 (two) times daily. 09/19/23  Yes [provider]  digoxin  (LANOXIN ) 0.125 MG tablet Take 1/2 tablet (0.0625mg  total) by mouth daily. 10/31/23  Yes Milford, Harlene HERO, FNP  empagliflozin  (JARDIANCE ) 10 MG TABS tablet Take 1 tablet (10 mg total) by mouth daily. 09/12/23  Yes   ferrous sulfate 325 (65 FE) MG tablet Take  325 mg by mouth daily with breakfast.   Yes [provider]  furosemide  (LASIX ) 40 MG tablet Take 1 tablet (40 mg total) by mouth every other day. Alternating with 20 mg. 10/15/23  Yes Marcine Catalan M, PA-C  gabapentin  (NEURONTIN ) 100 MG capsule Take 1 capsule (100 mg total) by mouth 2 (two) times daily. 09/18/23  Yes Boscia, Heather E, NP  ipratropium (ATROVENT ) 0.03 % nasal spray Place 1-2 sprays into both nostrils 2 (two) times daily as needed (nasal drainage). 07/31/23  Yes [name] Orlan HERO, DO  metFORMIN  (GLUCOPHAGE -XR) 500 MG 24 hr tablet Take 1 tablet (500 mg total) by mouth daily with evening meal. 09/23/23  Yes   metoprolol  succinate (TOPROL -XL) 25 MG 24 hr tablet Take 1 tablet (25 mg total) by mouth daily. 03/15/23  Yes Milford, Harlene HERO, FNP  multivitamin (ONE-A-DAY MEN'S) TABS tablet Take 1 tablet by mouth daily with breakfast.   Yes [provider]  pantoprazole  (PROTONIX ) 40 MG tablet Take 1 tablet (40 mg total) by mouth 2 (two) times daily. 09/30/23  Yes Odell Celinda Balo, MD  potassium chloride  (KLOR-CON ) 10 MEQ tablet Take 2 tablets (20 mEq total) by mouth daily. Patient taking differently: Take 10 mEq by mouth 2 (two) times daily. 08/12/23  Yes Milford, Harlene HERO, FNP  rivaroxaban  (XARELTO ) 20 MG TABS tablet Take 1 tablet (20 mg total) by mouth daily with supper. 03/15/23  Yes Milford, Harlene HERO, FNP  spironolactone  (ALDACTONE ) 25 MG tablet Take 0.5 tablets (12.5 mg total) by mouth daily. 10/15/23  Yes Marcine Catalan M, PA-C  sucralfate  (CARAFATE ) 1 GM/10ML suspension Take 10 mLs (1 g total) by mouth 4 (four) times daily -  with meals and at bedtime. 11/04/23  Yes Burton, Lacie K, NP  traMADol  (ULTRAM ) 50 MG tablet Take 1 tablet (50 mg total) by mouth every 6 (six) hours as needed. 10/08/23  Yes Burton, Lacie K, NP  valsartan  (DIOVAN ) 40 MG tablet Take 1 tablet (40 mg total) by mouth 2 (two) times daily. 08/13/23 08/12/24 Yes Rolan Ezra RAMAN, MD  Accu-Chek Softclix Lancets  lancets Use to check your blood sugar finger stick once a day Dx Code E11.9 90 days 05/27/23     Blood Glucose Monitoring Suppl (ACCU-CHEK GUIDE) w/Device KIT as directed to check blood sugar once a day 05/27/23     cetirizine  (ZYRTEC ) 10 MG tablet Take 1 tablet (10 mg total) by mouth daily. 08/30/23   Mannie Pac T, DO  clotrimazole -betamethasone  (LOTRISONE ) cream Apply a thin layer topically 2 (two) times daily for 1 to 2 weeks. 09/19/23     EPINEPHrine  0.3 mg/0.3 mL IJ SOAJ injection Inject 0.3 mg into the muscle as needed for anaphylaxis. 07/31/23   [name] Orlan HERO, DO  glucose blood (ACCU-CHEK GUIDE TEST) test strip Use to check blood sugars once daily. 05/27/23   [name], MD  lidocaine -prilocaine  (EMLA ) cream Apply 1 Application topically as needed. 04/03/23   Lanny,  Onita, MD  lidocaine -prilocaine  (EMLA ) cream Apply to affected area once 10/28/23   Lanny Onita, MD  ondansetron  (ZOFRAN ) 8 MG tablet Take 1 tablet (8 mg total) by mouth every 8 (eight) hours as needed for nausea or vomiting. 10/28/23   Lanny Onita, MD  pantoprazole  (PROTONIX ) 40 MG tablet Take 1 tablet (40 mg total) by mouth daily. 09/30/23 09/29/24  Odell Celinda Balo, MD  prochlorperazine  (COMPAZINE ) 10 MG tablet Take 1 tablet (10 mg total) by mouth every 6 (six) hours as needed for nausea or vomiting. 10/28/23   Lanny Onita, MD  valsartan  (DIOVAN ) 40 MG tablet Take 0.5 tablets (20 mg total) by mouth 2 (two) times daily. 10/15/23   Marcine Caffie HERO, PA-C     Family History  Problem Relation Age of Onset   Hypertension Mother    Heart disease Mother    Diabetes Mother    Diabetes Father    Hypertension Father    Cancer Father        lung cancer   Healthy Sister    Heart attack Brother    Heart disease Brother 79       + tobacco   Healthy Brother     Social History   Socioeconomic History   Marital status: Married    Spouse name: Not on file   Number of children: 2   Years of education: Not on file   Highest education  level: Not on file  Occupational History   Occupation: retired  Tobacco Use   Smoking status: Never    Passive exposure: Never   Smokeless tobacco: Never  Vaping Use   Vaping status: Never Used  Substance and Sexual Activity   Alcohol use: No   Drug use: No   Sexual activity: Not Currently  Other Topics Concern   Not on file  Social History Narrative   Retired Location manager. Married to Mrs. Carollee LOISE Budge. Daughter, Marko, lives in [place] . Son, Stark, lives in [place] .   Social Drivers of Corporate investment banker Strain: Low Risk  (12/26/2021)   Overall Financial Resource Strain (CARDIA)    Difficulty of Paying Living Expenses: Not very hard  Food Insecurity: No Food Insecurity (09/27/2023)   Hunger Vital Sign    Worried About Running Out of Food in the Last Year: Never true    Ran Out of Food in the Last Year: Never true  Transportation Needs: No Transportation Needs (09/27/2023)   PRAPARE - Administrator, Civil Service (Medical): No    Lack of Transportation (Non-Medical): No  Physical Activity: Insufficiently Active (09/26/2021)   Exercise Vital Sign    Days of Exercise per Week: 7 days    Minutes of Exercise per Session: 10 min  Stress: No Stress Concern Present (09/26/2021)   [employer] of Occupational Health - Occupational Stress Questionnaire    Feeling of Stress : Not at all  Social Connections: Unknown (09/28/2023)   Social Connection and Isolation Panel    Frequency of Communication with Friends and Family: Patient declined    Frequency of Social Gatherings with Friends and Family: Patient declined    Attends Religious Services: Not on Marketing executive or Organizations: Not on file    Attends Banker Meetings: Patient declined    Marital Status: Patient declined     Review of Systems: A 12 point ROS discussed and pertinent positives are indicated in the HPI above.  All other systems  are  negative.  Review of Systems  Constitutional:  Negative for chills and fever.  Respiratory:  Negative for cough and shortness of breath.   Cardiovascular:  Negative for chest pain.  Gastrointestinal:  Negative for abdominal pain, nausea and vomiting.  Musculoskeletal:  Negative for back pain.  Neurological:  Negative for dizziness and headaches.    Vital Signs: BP 109/69   Pulse (!) 59   Temp 98.7 F (37.1 C) (Oral)   Resp 17   Ht 5' 6 (1.676 m)   Wt 155 lb (70.3 kg)   SpO2 95%   BMI 25.02 kg/m   Physical Exam Vitals reviewed.  Constitutional:      General: He is not in acute distress. HENT:     Head: Normocephalic.     Mouth/Throat:     Mouth: Mucous membranes are moist.     Pharynx: Oropharynx is clear. No oropharyngeal exudate or posterior oropharyngeal erythema.  Cardiovascular:     Rate and Rhythm: Normal rate and regular rhythm.  Pulmonary:     Effort: Pulmonary effort is normal.     Breath sounds: Normal breath sounds.  Abdominal:     General: There is no distension.     Palpations: Abdomen is soft.     Tenderness: There is no abdominal tenderness.  Skin:    General: Skin is warm and dry.  Neurological:     Mental Status: He is alert and oriented to person, place, and time.  Psychiatric:        Mood and Affect: Mood normal.        Behavior: Behavior normal.        Thought Content: Thought content normal.        Judgment: Judgment normal.      MD Evaluation Airway: WNL Heart: WNL Abdomen: WNL Chest/ Lungs: WNL ASA  Classification: 3 Mallampati/Airway Score: Two   Imaging: IR CV Line Injection Result Date: 10/25/2023 CLINICAL DATA:  Right chest port catheter placed 12/23/2020 surgically, has worked well. More recently, recurrent difficulty aspirating from port catheter. Infuses easily. EXAM: PORT  CATHETER INJECTION UNDER FLUOROSCOPY TECHNIQUE: The procedure, risks (including but not limited to bleeding, infection, organ damage ), benefits, and  alternatives were explained to the patient. Questions regarding the procedure were encouraged and answered. The patient understands and consents to the procedure. Survey fluoroscopic inspection reveals stable position of right subclavian port catheter, tip proximal SVC. Stable left subclavian AICD leads. Injection demonstrates patency of the port with no leak. The SVC is occluded just distal to the confluence of the brachiocephalic veins. Injected contrast flows retrograde up the right brachiocephalic vein and drains via body wall collaterals. IMPRESSION: 1. Proximal SVC occlusion. 2. Port catheter extends to the distal right brachiocephalic vein, with retrograde flow draining via body wall collaterals. Electronically Signed   By: JONETTA Faes M.D.   On: 10/25/2023 18:00   NM PET Image Initial (PI) Skull Base To Thigh Result Date: 10/20/2023 CLINICAL DATA:  Subsequent treatment strategy for gastric carcinoma. EXAM: NUCLEAR MEDICINE PET SKULL BASE TO THIGH TECHNIQUE: 7.6 mCi F-18 FDG was injected intravenously. Full-ring PET imaging was performed from the skull base to thigh after the radiotracer. CT data was obtained and used for attenuation correction and anatomic localization. Fasting blood glucose: 99 mg/dl COMPARISON:  CT on 92/81/7974 FINDINGS: Mediastinal blood-pool activity (background): SUV max = 2.5 Liver activity (reference): SUV max = N/A NECK:  No hypermetabolic lymph nodes or masses. Incidental CT findings:  None. CHEST: No  hypermetabolic lymph nodes. No suspicious pulmonary nodules seen on CT images. Incidental CT findings:  None. ABDOMEN/PELVIS: FDG uptake is seen throughout the stomach, but shows increased intensity in the gastric antrum corresponding to mild diffuse wall thickening on CT images. This has SUV max of 17.5 compared to uptake in the upper gastric body with SUV max of 4.9. No abnormal hypermetabolic activity within the liver, pancreas, adrenal glands, or spleen. No hypermetabolic lymph  nodes in the abdomen or pelvis. Incidental CT findings:  Stable small cyst in right hepatic lobe. SKELETON: No focal hypermetabolic bone lesions to suggest skeletal metastasis. Incidental CT findings:  None. IMPRESSION: Increased FDG uptake in the gastric antrum corresponding to mild diffuse wall thickening on CT images. Local recurrence of gastric carcinoma cannot be excluded. Recommend correlation with endoscopy. No evidence of regional lymph node or distant metastatic disease. Electronically Signed   By: Norleen DELENA Kil M.D.   On: 10/20/2023 10:06   CUP PACEART REMOTE DEVICE CHECK Result Date: 10/09/2023 BIV ICD scheduled remote reviewed. Normal device function.  Presenting rhythm: AF-VP (On OAC per chart) 1 NSVT event x 12 beats, irregular. Next remote 91 days. AB, CVRS   Labs:  CBC: Recent Labs    09/28/23 0619 10/08/23 1305 10/26/23 1003 10/26/23 1006 10/28/23 0926  WBC 7.9 9.8 11.5*  --  10.0  HGB 9.1* 9.9* 9.9* 11.2* 10.2*  HCT 28.1* 31.6* 32.4* 33.0* 32.5*  PLT 339 327 452*  --  370    COAGS: No results for input(s): INR, APTT in the last 8760 hours.  BMP: Recent Labs    10/08/23 1305 10/15/23 0915 10/26/23 1003 10/26/23 1006 10/28/23 0926  NA 138 136 132* 133* 137  K 4.5 4.3 3.7 3.9 4.2  CL 101 102 96* 98 103  CO2 27 22 21*  --  25  GLUCOSE 107* 95 106* 107* 117*  BUN 22 19 21  25* 28*  CALCIUM  9.3 9.1 9.1  --  9.3  CREATININE 1.29* 1.29* 1.11 1.10 1.14  GFRNONAA 58* 58* >60  --  >60    LIVER FUNCTION TESTS: Recent Labs    10/08/23 1304 10/08/23 1305 10/26/23 1003 10/28/23 0926  BILITOT 0.5 0.5 0.3 0.5  AST 12* 12* 25 17  ALT 11 11 16 14   ALKPHOS 41 41 46 46  PROT 7.0 7.1 7.2 7.3  ALBUMIN 3.9 3.9 3.4* 3.9    TUMOR MARKERS: Recent Labs    10/08/23 1305  CEA 2.04    Assessment and Plan:  73 y/o M with history of gastric cancer currently undergoing systemic treatment who presents today for port revision/possible removal/possible central line  placement due to inability to aspirate and recent imaging notable for occlusion of the SVC. Patient has a surgically placed right subclavian port placed 12/23/2020. Plan to attempt to salvage current port and if unable to to do so plan for removal and placement of central line so patient can continue to receive treatment.  Risks and benefits of image-guided Port-a-catheter placement were discussed with the patient including, but not limited to bleeding, infection, pneumothorax, or fibrin sheath development and need for additional procedures.  All of the patient's questions were answered, patient is agreeable to proceed.  Consent signed and in chart.  Thank you for this interesting consult.  I greatly enjoyed meeting JAYZEN PAVER and look forward to participating in their care.  A copy of this report was sent to the requesting provider on this date.  Electronically Signed: Clotilda DELENA Hesselbach, PA-C  11/04/2023, 2:03 PM   I spent a total of  30 Minutes  in face to face in clinical consultation, greater than 50% of which was counseling/coordinating care for port malfunction.

## 2023-11-04 NOTE — Assessment & Plan Note (Signed)
 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  S/p FLOT4 01/02/2021 - 03/22/2021 -Posttreatment EUS 05/2021 and 06/29/2022 by Dr. Rollin showed a scar and residual erythema in stomach, biopsy negative for residual malignancy.  -Jon Williams is clinically doing well on surveillance. Last CT 03/20/22 which shows no evidence of recurrent or metastatic disease, and stable bilateral adrenal adenomas -Surveillance CT scan from October 09, 2022 showed no evidence of recurrence -Surveillance CT scan from April 30, 2023 showed no evidence of residual or recurrent malignancy.  Small liver lesion is stable, favored to be benign. -He unfortunately had local recurrence in 09/2023 -he started Nivo and Ipi on 10/28/2023

## 2023-11-04 NOTE — Discharge Instructions (Signed)
 SABRA

## 2023-11-04 NOTE — ED Provider Notes (Signed)
 [facility]    CSN: [id number] Arrival date & time: 11/04/23  1753      History   Chief Complaint Chief Complaint  Patient presents with   Rash    HPI Jon Williams is a 73 y.o. male.   Patient presents with rash that began on his chest and abdominal wall on 9/21.  Patient states that he just received his first chemo therapy treatment on 9/15.  Patient states that he has a little bit of upset stomach since then and began to have a rash on 9/21.  Patient denies excessive vomiting or diarrhea.  Patient did have an IR procedure today to have a PICC line placed in his right arm.  Patient states that he was not completely sedated for this procedure, but they did give him medication to moderately sedate him during this procedure.  Patient's blood pressure is low at 84/54 in clinic today after checking it multiple times.  Patient states that he is experiencing some weakness, but attributes this to him being n.p.o. as of last night for procedure today.  Patient states that he also has not taken any blood pressure medication today as he was advised not to because of his procedure.  The history is provided by the patient and medical records.  Rash   Past Medical History:  Diagnosis Date   AICD (automatic cardioverter/defibrillator) present 05/25/2016   biv icd   Angio-edema    Bunion, right    Chronic combined systolic and diastolic CHF (congestive heart failure) (HCC)    Echo 1/18: Mild conc LVH, EF 15-20, severe diff HK, inf and inf-septal AK, Gr 3 DD, mild to mod MR, severe LAE, mod reduced RVSF, mod RAE, mild TR, PASP 50   Coronary artery disease involving native coronary artery without angina pectoris 04/17/2016   LHC 1/18: pLCx 30, mLCx 20, mRCA 40, dRCA 20, LVEDP 23, mean RA 8, PA 42/20, PCWP 17   Diabetes mellitus without complication (HCC)    Gastric cancer (HCC)    History of atrial fibrillation    History of atrial flutter    History of cardiomegaly  06/07/2016   Noted on CXR   History of colon polyps 06/28/2017   Noted on colonoscopy   Hypertension    LBBB (left bundle branch block)    Nausea vomiting and diarrhea 04/04/2021   NICM (nonischemic cardiomyopathy) (HCC)    Echo 1/18:  Mild conc LVH, EF 15-20, severe diff HK, inf and inf-septal AK, Gr 3 DD, mild to mod MR, severe LAE, mod reduced RVSF, mod RAE, mild TR, PASP 50   Other secondary pulmonary hypertension (HCC) 04/17/2016   Prostate cancer (HCC) 2019   Sigmoid diverticulosis 06/28/2017   Noted on colonoscopy    Patient Active Problem List   Diagnosis Date Noted   AKI (acute kidney injury) 09/27/2023   Acute on chronic systolic (congestive) heart failure (HCC) 10/29/2022   Acute on chronic systolic heart failure (HCC) 10/29/2022   Obesity (BMI 30-39.9) 12/25/2021   Community acquired pneumonia of right lower lobe of lung 12/25/2021   Malnutrition of moderate degree 09/15/2021   DM (diabetes mellitus), type 2 (HCC) 09/14/2021   Essential hypertension 09/13/2021   Dyslipidemia 09/13/2021   DNR (do not resuscitate) 09/13/2021   Port-A-Cath in place 06/15/2021   Hypotension due to hypovolemia 04/04/2021   Nausea vomiting and diarrhea 04/04/2021   Thrombocytopenia 04/04/2021   Leukocytosis 04/04/2021   Acute renal failure superimposed on stage 3a chronic kidney disease (  HCC) 04/04/2021   Diarrhea 04/01/2021   Occult blood in stools 04/01/2021   Foot ulcer, right (HCC) 04/01/2021   Hypotension 04/01/2021   Dehydration 04/01/2021   Iron  deficiency anemia 12/12/2020   Gastric cancer (HCC) 12/08/2020   Symptomatic anemia 11/30/2020   Gastrointestinal hemorrhage    Corns and callosities 08/07/2019   Blood clotting disorder 05/06/2019   Coagulation defect 05/06/2019   Pacemaker lead malfunction 10/10/2018   Pacemaker complications 10/10/2018   Pacemaker lead failure, sequela 10/10/2018   S/P knee replacement 08/16/2017   S/P total knee arthroplasty 08/16/2017    Malignant neoplasm of prostate (HCC) 04/02/2017   ICD (implantable cardioverter-defibrillator) in place    Persistent atrial fibrillation (HCC) 05/25/2016   Coronary artery disease involving native coronary artery without angina pectoris 04/17/2016   Other secondary pulmonary hypertension (HCC) 04/17/2016   Acute on chronic combined systolic and diastolic CHF (congestive heart failure) (HCC)    NICM (nonischemic cardiomyopathy) (HCC)    LBBB (left bundle branch block)     Past Surgical History:  Procedure Laterality Date   BIOPSY  12/01/2020   Procedure: BIOPSY;  Surgeon: Rollin Dover, MD;  Location: [facility] ENDOSCOPY;  Service: Endoscopy;;   BIOPSY  06/09/2021   Procedure: BIOPSY;  Surgeon: Rollin Dover, MD;  Location: [facility];  Service: Gastroenterology;;   BIOPSY  06/29/2022   Procedure: BIOPSY;  Surgeon: Rollin Dover, MD;  Location: [facility];  Service: Gastroenterology;;   BIV ICD INSERTION CRT-D N/A 05/25/2016   Procedure: BiV ICD Insertion CRT-D;  Surgeon: Danelle LELON Birmingham, MD;  Location: [facility] INVASIVE CV LAB;  Service: Cardiovascular;  Laterality: N/A;   CARDIAC CATHETERIZATION N/A 03/02/2016   Procedure: Right/Left Heart Cath and Coronary Angiography;  Surgeon: Lonni Hanson, MD;  Location: [facility] INVASIVE CV LAB;  Service: Cardiovascular;  Laterality: N/A;   CARDIOVERSION N/A 07/17/2016   Procedure: Cardioversion;  Surgeon: Birmingham Danelle LELON, MD;  Location: [facility] INVASIVE CV LAB;  Service: Cardiovascular;  Laterality: N/A;   COLONOSCOPY WITH PROPOFOL  N/A 06/28/2017   Procedure: COLONOSCOPY WITH PROPOFOL ;  Surgeon: Rollin Dover, MD;  Location: [facility] ENDOSCOPY;  Service: Endoscopy;  Laterality: N/A;   colonscopy  2009   ESOPHAGOGASTRODUODENOSCOPY N/A 12/01/2020   Procedure: ESOPHAGOGASTRODUODENOSCOPY (EGD);  Surgeon: Rollin Dover, MD;  Location: [facility] ENDOSCOPY;  Service: Endoscopy;  Laterality: N/A;  IDA/guaiac positive stools   ESOPHAGOGASTRODUODENOSCOPY N/A 10/04/2023   Procedure: EGD  (ESOPHAGOGASTRODUODENOSCOPY);  Surgeon: Rollin Dover, MD;  Location: [facility] ENDOSCOPY;  Service: Gastroenterology;  Laterality: N/A;   ESOPHAGOGASTRODUODENOSCOPY (EGD) WITH PROPOFOL  N/A 12/16/2020   Procedure: ESOPHAGOGASTRODUODENOSCOPY (EGD) WITH PROPOFOL ;  Surgeon: Rollin Dover, MD;  Location: [facility] ENDOSCOPY;  Service: Endoscopy;  Laterality: N/A;   ESOPHAGOGASTRODUODENOSCOPY (EGD) WITH PROPOFOL  N/A 06/09/2021   Procedure: ESOPHAGOGASTRODUODENOSCOPY (EGD) WITH PROPOFOL ;  Surgeon: Rollin Dover, MD;  Location: [facility] ENDOSCOPY;  Service: Gastroenterology;  Laterality: N/A;   ESOPHAGOGASTRODUODENOSCOPY (EGD) WITH PROPOFOL  N/A 06/29/2022   Procedure: ESOPHAGOGASTRODUODENOSCOPY (EGD) WITH PROPOFOL ;  Surgeon: Rollin Dover, MD;  Location: [facility] ENDOSCOPY;  Service: Gastroenterology;  Laterality: N/A;   GOLD SEED IMPLANT N/A 12/24/2017   Procedure: GOLD SEED IMPLANT, TRANSERINEAL;  Surgeon: Nieves Cough, MD;  Location: [facility] ORS;  Service: Urology;  Laterality: N/A;   INSERT / REPLACE / REMOVE PACEMAKER     IR CV LINE INJECTION  10/25/2023   LEAD REVISION  10/10/2018   LEAD REVISION/REPAIR N/A 10/10/2018   Procedure: LEAD REVISION/REPAIR;  Surgeon: Birmingham Danelle LELON, MD;  Location: [facility] INVASIVE CV LAB;  Service: Cardiovascular;  Laterality: N/A;  POLYPECTOMY  06/28/2017   Procedure: POLYPECTOMY;  Surgeon: Rollin Dover, MD;  Location: [facility] ENDOSCOPY;  Service: Endoscopy;;  ascending and descending colon polyp   PORTACATH PLACEMENT Right 12/23/2020   Procedure: INSERTION PORT-A-CATH;  Surgeon: Dasie Leonor CROME, MD;  Location: [facility] OR;  Service: General;  Laterality: Right;   PROSTATE BIOPSY  02/20/2017   RIGHT HEART CATH N/A 07/25/2023   Procedure: RIGHT HEART CATH;  Surgeon: Rolan Ezra RAMAN, MD;  Location: [facility] INVASIVE CV LAB;  Service: Cardiovascular;  Laterality: N/A;   RIGHT/LEFT HEART CATH AND CORONARY ANGIOGRAPHY N/A 10/30/2022   Procedure: RIGHT/LEFT HEART CATH AND CORONARY ANGIOGRAPHY;  Surgeon: Rolan Ezra RAMAN,  MD;  Location: [facility] INVASIVE CV LAB;  Service: Cardiovascular;  Laterality: N/A;   SPACE OAR INSTILLATION N/A 12/24/2017   Procedure: SPACE OAR INSTILLATION;  Surgeon: Nieves Cough, MD;  Location: [facility] ORS;  Service: Urology;  Laterality: N/A;   TOTAL KNEE ARTHROPLASTY Right 08/16/2017   Procedure: RIGHT TOTAL KNEE ARTHROPLASTY;  Surgeon: Shari Sieving, MD;  Location[facility] OR;  Service: Orthopedics;  Laterality: Right;   UPPER ESOPHAGEAL ENDOSCOPIC ULTRASOUND (EUS) N/A 12/16/2020   Procedure: UPPER ESOPHAGEAL ENDOSCOPIC ULTRASOUND (EUS);  Surgeon: Rollin Dover, MD;  Location: [facility];  Service: Endoscopy;  Laterality: N/A;       Home Medications    Prior to Admission medications   Medication Sig Start Date End Date Taking? Authorizing Provider  Accu-Chek Softclix Lancets lancets Use to check your blood sugar finger stick once a day Dx Code E11.9 90 days 05/27/23  Yes   amiodarone  (PACERONE ) 200 MG tablet Take 1 tablet (200 mg total) by mouth 2 (two) times daily for 14 days, THEN 1 tablet (200 mg total) daily. 09/16/23 03/28/24 Yes Milford, Harlene HERO, FNP  atorvastatin  (LIPITOR) 40 MG tablet Take 1 tablet (40 mg total) by mouth daily. 05/20/23  Yes Milford, Jessica M, FNP  Biotin 1000 MCG tablet Take 1,000 mcg by mouth daily with breakfast.   Yes [provider]  Blood Glucose Monitoring Suppl (ACCU-CHEK GUIDE) w/Device KIT as directed to check blood sugar once a day 05/27/23  Yes   cetirizine  (ZYRTEC ) 10 MG tablet Take 1 tablet (10 mg total) by mouth daily. 08/30/23  Yes Mannie Pac T, DO  cholecalciferol  (VITAMIN D3) 25 MCG (1000 UT) tablet Take 1,000 Units by mouth daily with breakfast.   Yes [provider]  clotrimazole -betamethasone  (LOTRISONE ) cream Apply 1 Application topically 2 (two) times daily. 09/19/23  Yes [provider]  clotrimazole -betamethasone  (LOTRISONE ) cream Apply a thin layer topically 2 (two) times daily for 1 to 2 weeks. 09/19/23  Yes   digoxin   (LANOXIN ) 0.125 MG tablet Take 1/2 tablet (0.0625mg  total) by mouth daily. 10/31/23  Yes Milford, Harlene HERO, FNP  empagliflozin  (JARDIANCE ) 10 MG TABS tablet Take 1 tablet (10 mg total) by mouth daily. 09/12/23  Yes   EPINEPHrine  0.3 mg/0.3 mL IJ SOAJ injection Inject 0.3 mg into the muscle as needed for anaphylaxis. 07/31/23  Yes [name] Orlan HERO, DO  ferrous sulfate 325 (65 FE) MG tablet Take 325 mg by mouth daily with breakfast.   Yes [provider]  furosemide  (LASIX ) 40 MG tablet Take 1 tablet (40 mg total) by mouth every other day. Alternating with 20 mg. 10/15/23  Yes Marcine, Brittainy M, PA-C  gabapentin  (NEURONTIN ) 100 MG capsule Take 1 capsule (100 mg total) by mouth 2 (two) times daily. 09/18/23  Yes Boscia, Heather E, NP  glucose blood (ACCU-CHEK GUIDE TEST) test strip  Use to check blood sugars once daily. 05/27/23  Yes Polite, Tanda, MD  ipratropium (ATROVENT ) 0.03 % nasal spray Place 1-2 sprays into both nostrils 2 (two) times daily as needed (nasal drainage). 07/31/23  Yes [name] Orlan HERO, DO  lidocaine -prilocaine  (EMLA ) cream Apply 1 Application topically as needed. 04/03/23  Yes Lanny Callander, MD  lidocaine -prilocaine  (EMLA ) cream Apply to affected area once 10/28/23  Yes Lanny Callander, MD  metFORMIN  (GLUCOPHAGE -XR) 500 MG 24 hr tablet Take 1 tablet (500 mg total) by mouth daily with evening meal. 09/23/23  Yes   metoprolol  succinate (TOPROL -XL) 25 MG 24 hr tablet Take 1 tablet (25 mg total) by mouth daily. 03/15/23  Yes Milford, Harlene HERO, FNP  multivitamin (ONE-A-DAY MEN'S) TABS tablet Take 1 tablet by mouth daily with breakfast.   Yes [provider]  ondansetron  (ZOFRAN ) 8 MG tablet Take 1 tablet (8 mg total) by mouth every 8 (eight) hours as needed for nausea or vomiting. 10/28/23  Yes Lanny Callander, MD  pantoprazole  (PROTONIX ) 40 MG tablet Take 1 tablet (40 mg total) by mouth daily. 09/30/23 09/29/24 Yes Odell Celinda Balo, MD  pantoprazole  (PROTONIX ) 40 MG tablet Take 1 tablet (40 mg total)  by mouth 2 (two) times daily. 09/30/23  Yes Odell Celinda Balo, MD  potassium chloride  (KLOR-CON ) 10 MEQ tablet Take 2 tablets (20 mEq total) by mouth daily. Patient taking differently: Take 10 mEq by mouth 2 (two) times daily. 08/12/23  Yes Milford, Harlene HERO, FNP  prochlorperazine  (COMPAZINE ) 10 MG tablet Take 1 tablet (10 mg total) by mouth every 6 (six) hours as needed for nausea or vomiting. 10/28/23  Yes Lanny Callander, MD  rivaroxaban  (XARELTO ) 20 MG TABS tablet Take 1 tablet (20 mg total) by mouth daily with supper. 03/15/23  Yes Milford, Harlene HERO, FNP  spironolactone  (ALDACTONE ) 25 MG tablet Take 0.5 tablets (12.5 mg total) by mouth daily. 10/15/23  Yes Marcine Catalan M, PA-C  sucralfate  (CARAFATE ) 1 GM/10ML suspension Take 10 mLs (1 g total) by mouth 4 (four) times daily -  with meals and at bedtime. 11/04/23  Yes Burton, Lacie K, NP  traMADol  (ULTRAM ) 50 MG tablet Take 1 tablet (50 mg total) by mouth every 6 (six) hours as needed. 10/08/23  Yes Burton, Lacie K, NP  valsartan  (DIOVAN ) 40 MG tablet Take 1 tablet (40 mg total) by mouth 2 (two) times daily. 08/13/23 08/12/24 Yes Rolan Ezra RAMAN, MD  valsartan  (DIOVAN ) 40 MG tablet Take 0.5 tablets (20 mg total) by mouth 2 (two) times daily. 10/15/23  Yes Marcine Catalan HERO, PA-C    Family History Family History  Problem Relation Age of Onset   Hypertension Mother    Heart disease Mother    Diabetes Mother    Diabetes Father    Hypertension Father    Cancer Father        lung cancer   Healthy Sister    Heart attack Brother    Heart disease Brother 33       + tobacco   Healthy Brother     Social History Social History   Tobacco Use   Smoking status: Never    Passive exposure: Never   Smokeless tobacco: Never  Vaping Use   Vaping status: Never Used  Substance Use Topics   Alcohol use: No   Drug use: No     Allergies   Aspirin , Sacubitril -valsartan , Sulfa antibiotics, and Ace inhibitors   Review of Systems Review of Systems   Skin:  Positive for  rash.   Per HPI  Physical Exam Triage Vital Signs ED Triage Vitals  Encounter Vitals Group     BP 11/04/23 1843 (!) 84/54     Girls Systolic BP Percentile --      Girls Diastolic BP Percentile --      Boys Systolic BP Percentile --      Boys Diastolic BP Percentile --      Pulse Rate 11/04/23 1843 64     Resp 11/04/23 1843 16     Temp 11/04/23 1843 98.7 F (37.1 C)     Temp Source 11/04/23 1843 Oral     SpO2 11/04/23 1843 95 %     Weight --      Height --      Head Circumference --      Peak Flow --      Pain Score 11/04/23 1842 0     Pain Loc --      Pain Education --      Exclude from Growth Chart --    No data found.  Updated Vital Signs BP (!) 84/54 (BP Location: Left Arm)   Pulse 64   Temp 98.7 F (37.1 C) (Oral)   Resp 16   SpO2 95%   Visual Acuity Right Eye Distance:   Left Eye Distance:   Bilateral Distance:    Right Eye Near:   Left Eye Near:    Bilateral Near:     Physical Exam Vitals and nursing note reviewed.  Constitutional:      General: He is awake. He is not in acute distress.    Appearance: Normal appearance. He is well-developed and well-groomed. He is not ill-appearing.  Cardiovascular:     Rate and Rhythm: Normal rate and regular rhythm.  Pulmonary:     Effort: Pulmonary effort is normal.     Breath sounds: Normal breath sounds.  Skin:    General: Skin is warm and dry.     Findings: Rash present. Rash is macular.     Comments: Diffuse macular rash noted to chest wall and abdominal wall.  Neurological:     General: No focal deficit present.     Mental Status: He is alert and oriented to person, place, and time. Mental status is at baseline.     GCS: GCS eye subscore is 4. GCS verbal subscore is 5. GCS motor subscore is 6.  Psychiatric:        Behavior: Behavior is cooperative.      UC Treatments / Results  Labs (all labs ordered are listed, but only abnormal results are displayed) Labs Reviewed - No  data to display  EKG   Radiology No results found.  Procedures Procedures (including critical care time)  Medications Ordered in UC Medications - No data to display  Initial Impression / Assessment and Plan / UC Course  I have reviewed the triage vital signs and the nursing notes.  Pertinent labs & imaging results that were available during my care of the patient were reviewed by me and considered in my medical decision making (see chart for details).     Patient is overall well-appearing but does appear to be very tired.  Blood pressure is low at 84/54.  All other vital signs within normal limits.  No significant findings on exam other than rash and tired appearance.  Recommended patient be seen in the emergency department due to low blood pressure after procedure today.  Patient and wife adamantly declined going to the  emergency department due to being tired and patient stating that he believes his blood pressure is low because he has had nothing to eat or drink all day.  Discussed with patient and wife the risks of not going to the emergency department for further evaluation of his low blood pressure.  Patient and wife still declining to go to the emergency department.  Patient is agreeable to signing an AMA form. Final Clinical Impressions(s) / UC Diagnoses   Final diagnoses:  Hypotension, unspecified hypotension type  Rash     Discharge Instructions      It is my recommendation that you be seen in the emergency department due to your low blood pressure after your procedure that was done today. Because you have declined this recommendation we have had you signed an AMA (AGAINST MEDICAL ADVICE) form today.    ED Prescriptions   None    PDMP not reviewed this encounter.   Johnie Flaming A, NP 11/04/23 [phone]

## 2023-11-04 NOTE — Procedures (Signed)
 Interventional Radiology Procedure Note  Procedure: SVC venogram, collateral vein catheterization, port removal, RUE PICC line placement  Complications: None  Estimated Blood Loss: < 10 mL  Findings: Right femoral vein access and SVC catheterization demonstrates chronic SVC stenosis/relative occlusion around pacer wires with primary inflow in chest via azygous vein and collateral veins. Unable to catheterize either native brachiocephalic vein from below.  Malfunctioning right subclavian vein port removed.  Right arm DL PICC placed via right brachial vein with tip in right subclavian vein, 26 cm length.  Marcey DASEN. Luverne, M.D Pager:  [phone]

## 2023-11-04 NOTE — Discharge Instructions (Addendum)
 It is my recommendation that you be seen in the emergency department due to your low blood pressure after your procedure that was done today. Because you have declined this recommendation we have had you signed an AMA (AGAINST MEDICAL ADVICE) form today.

## 2023-11-05 ENCOUNTER — Emergency Department (HOSPITAL_COMMUNITY)

## 2023-11-05 ENCOUNTER — Emergency Department (HOSPITAL_COMMUNITY)
Admission: EM | Admit: 2023-11-05 | Discharge: 2023-11-05 | Disposition: A | Discharge status: Home / Self Care | Attending: Emergency Medicine | Admitting: Emergency Medicine

## 2023-11-05 ENCOUNTER — Other Ambulatory Visit: Payer: Self-pay

## 2023-11-05 ENCOUNTER — Other Ambulatory Visit (HOSPITAL_COMMUNITY): Payer: Self-pay

## 2023-11-05 ENCOUNTER — Encounter (HOSPITAL_COMMUNITY): Payer: Self-pay

## 2023-11-05 ENCOUNTER — Inpatient Hospital Stay (HOSPITAL_BASED_OUTPATIENT_CLINIC_OR_DEPARTMENT_OTHER): Admitting: Hematology

## 2023-11-05 DIAGNOSIS — I509 Heart failure, unspecified: Secondary | ICD-10-CM | POA: Insufficient documentation

## 2023-11-05 DIAGNOSIS — R21 Rash and other nonspecific skin eruption: Secondary | ICD-10-CM | POA: Diagnosis not present

## 2023-11-05 DIAGNOSIS — Z7984 Long term (current) use of oral hypoglycemic drugs: Secondary | ICD-10-CM | POA: Diagnosis not present

## 2023-11-05 DIAGNOSIS — I130 Hypertensive heart and chronic kidney disease with heart failure and stage 1 through stage 4 chronic kidney disease, or unspecified chronic kidney disease: Secondary | ICD-10-CM | POA: Insufficient documentation

## 2023-11-05 DIAGNOSIS — I2510 Atherosclerotic heart disease of native coronary artery without angina pectoris: Secondary | ICD-10-CM | POA: Insufficient documentation

## 2023-11-05 DIAGNOSIS — K2900 Acute gastritis without bleeding: Secondary | ICD-10-CM | POA: Diagnosis not present

## 2023-11-05 DIAGNOSIS — Z95810 Presence of automatic (implantable) cardiac defibrillator: Secondary | ICD-10-CM | POA: Insufficient documentation

## 2023-11-05 DIAGNOSIS — Z7901 Long term (current) use of anticoagulants: Secondary | ICD-10-CM | POA: Diagnosis not present

## 2023-11-05 DIAGNOSIS — Z79899 Other long term (current) drug therapy: Secondary | ICD-10-CM | POA: Insufficient documentation

## 2023-11-05 DIAGNOSIS — I739 Peripheral vascular disease, unspecified: Secondary | ICD-10-CM | POA: Diagnosis not present

## 2023-11-05 DIAGNOSIS — N189 Chronic kidney disease, unspecified: Secondary | ICD-10-CM | POA: Insufficient documentation

## 2023-11-05 DIAGNOSIS — I4891 Unspecified atrial fibrillation: Secondary | ICD-10-CM | POA: Diagnosis not present

## 2023-11-05 DIAGNOSIS — E119 Type 2 diabetes mellitus without complications: Secondary | ICD-10-CM | POA: Insufficient documentation

## 2023-11-05 DIAGNOSIS — E1169 Type 2 diabetes mellitus with other specified complication: Secondary | ICD-10-CM | POA: Diagnosis not present

## 2023-11-05 DIAGNOSIS — Z85028 Personal history of other malignant neoplasm of stomach: Secondary | ICD-10-CM | POA: Diagnosis not present

## 2023-11-05 DIAGNOSIS — K429 Umbilical hernia without obstruction or gangrene: Secondary | ICD-10-CM | POA: Diagnosis not present

## 2023-11-05 DIAGNOSIS — C61 Malignant neoplasm of prostate: Secondary | ICD-10-CM

## 2023-11-05 DIAGNOSIS — C163 Malignant neoplasm of pyloric antrum: Secondary | ICD-10-CM

## 2023-11-05 DIAGNOSIS — I5022 Chronic systolic (congestive) heart failure: Secondary | ICD-10-CM | POA: Diagnosis not present

## 2023-11-05 DIAGNOSIS — R109 Unspecified abdominal pain: Secondary | ICD-10-CM | POA: Diagnosis not present

## 2023-11-05 LAB — I-STAT CG4 LACTIC ACID, ED: Lactic Acid, Venous: 1.1 mmol/L (ref 0.5–1.9)

## 2023-11-05 LAB — CBC WITH DIFFERENTIAL/PLATELET
Abs Immature Granulocytes: 0.03 K/uL (ref 0.00–0.07)
Basophils Absolute: 0 K/uL (ref 0.0–0.1)
Basophils Relative: 0 %
Eosinophils Absolute: 0.3 K/uL (ref 0.0–0.5)
Eosinophils Relative: 3 %
HCT: 33.2 % — ABNORMAL LOW (ref 39.0–52.0)
Hemoglobin: 9.7 g/dL — ABNORMAL LOW (ref 13.0–17.0)
Immature Granulocytes: 0 %
Lymphocytes Relative: 11 %
Lymphs Abs: 0.9 K/uL (ref 0.7–4.0)
MCH: 27.4 pg (ref 26.0–34.0)
MCHC: 29.2 g/dL — ABNORMAL LOW (ref 30.0–36.0)
MCV: 93.8 fL (ref 80.0–100.0)
Monocytes Absolute: 0.8 K/uL (ref 0.1–1.0)
Monocytes Relative: 9 %
Neutro Abs: 6.4 K/uL (ref 1.7–7.7)
Neutrophils Relative %: 77 %
Platelets: 365 K/uL (ref 150–400)
RBC: 3.54 MIL/uL — ABNORMAL LOW (ref 4.22–5.81)
RDW: 15.1 % (ref 11.5–15.5)
WBC: 8.4 K/uL (ref 4.0–10.5)
nRBC: 0 % (ref 0.0–0.2)

## 2023-11-05 LAB — COMPREHENSIVE METABOLIC PANEL WITH GFR
ALT: 16 U/L (ref 0–44)
AST: 20 U/L (ref 15–41)
Albumin: 3.7 g/dL (ref 3.5–5.0)
Alkaline Phosphatase: 57 U/L (ref 38–126)
Anion gap: 15 (ref 5–15)
BUN: 21 mg/dL (ref 8–23)
CO2: 19 mmol/L — ABNORMAL LOW (ref 22–32)
Calcium: 9.4 mg/dL (ref 8.9–10.3)
Chloride: 99 mmol/L (ref 98–111)
Creatinine, Ser: 1.16 mg/dL (ref 0.61–1.24)
GFR, Estimated: >60 mL/min (ref >60)
Glucose, Bld: 115 mg/dL — ABNORMAL HIGH (ref 70–99)
Potassium: 4.6 mmol/L (ref 3.5–5.1)
Sodium: 133 mmol/L — ABNORMAL LOW (ref 135–145)
Total Bilirubin: 0.4 mg/dL (ref 0.0–1.2)
Total Protein: 6.9 g/dL (ref 6.5–8.1)

## 2023-11-05 LAB — URINALYSIS, ROUTINE W REFLEX MICROSCOPIC
Bilirubin Urine: NEGATIVE
Glucose, UA: >=500 mg/dL — AB
Hgb urine dipstick: NEGATIVE
Ketones, ur: NEGATIVE mg/dL
Leukocytes,Ua: NEGATIVE
Nitrite: NEGATIVE
Protein, ur: NEGATIVE mg/dL
Specific Gravity, Urine: >1.046 — ABNORMAL HIGH (ref 1.005–1.030)
pH: 5 (ref 5.0–8.0)

## 2023-11-05 LAB — I-STAT CHEM 8, ED
BUN: 20 mg/dL (ref 8–23)
Calcium, Ion: 1.14 mmol/L — ABNORMAL LOW (ref 1.15–1.40)
Chloride: 103 mmol/L (ref 98–111)
Creatinine, Ser: 1.1 mg/dL (ref 0.61–1.24)
Glucose, Bld: 104 mg/dL — ABNORMAL HIGH (ref 70–99)
HCT: 29 % — ABNORMAL LOW (ref 39.0–52.0)
Hemoglobin: 9.9 g/dL — ABNORMAL LOW (ref 13.0–17.0)
Potassium: 4.3 mmol/L (ref 3.5–5.1)
Sodium: 135 mmol/L (ref 135–145)
TCO2: 24 mmol/L (ref 22–32)

## 2023-11-05 LAB — LIPASE, BLOOD: Lipase: 32 U/L (ref 11–51)

## 2023-11-05 MED ORDER — IOHEXOL 300 MG/ML  SOLN
100.0000 mL | Freq: Once | INTRAMUSCULAR | Status: AC | PRN
  Administered 2023-11-05: 100 mL via INTRAVENOUS

## 2023-11-05 MED ORDER — LACTATED RINGERS IV BOLUS
1000.0000 mL | Freq: Once | INTRAVENOUS | Status: AC
  Administered 2023-11-05: 1000 mL via INTRAVENOUS

## 2023-11-05 MED ORDER — ALUM & MAG HYDROXIDE-SIMETH 200-200-20 MG/5ML PO SUSP
30.0000 mL | Freq: Once | ORAL | Status: AC
  Administered 2023-11-05: 30 mL via ORAL
  Filled 2023-11-05: qty 30

## 2023-11-05 MED ORDER — HYDROXYZINE HCL 25 MG PO TABS
25.0000 mg | ORAL_TABLET | Freq: Once | ORAL | Status: AC
Start: 2023-11-05 — End: 2023-11-05
  Administered 2023-11-05: 25 mg via ORAL
  Filled 2023-11-05: qty 1

## 2023-11-05 MED ORDER — HYDROXYZINE HCL 25 MG PO TABS
12.5000 mg | ORAL_TABLET | Freq: Three times a day (TID) | ORAL | 0 refills | Status: AC | PRN
  Filled 2023-11-05: qty 3, 2d supply, fill #0

## 2023-11-05 MED ORDER — LIDOCAINE VISCOUS HCL 2 % MT SOLN
15.0000 mL | Freq: Once | OROMUCOSAL | Status: AC
  Administered 2023-11-05: 15 mL via ORAL
  Filled 2023-11-05: qty 15

## 2023-11-05 MED ORDER — MORPHINE SULFATE (PF) 4 MG/ML IV SOLN
4.0000 mg | Freq: Once | INTRAVENOUS | Status: AC
  Administered 2023-11-05: 4 mg via INTRAVENOUS
  Filled 2023-11-05: qty 1

## 2023-11-05 MED ORDER — PANTOPRAZOLE SODIUM 40 MG IV SOLR
40.0000 mg | Freq: Once | INTRAVENOUS | Status: AC
  Administered 2023-11-05: 40 mg via INTRAVENOUS
  Filled 2023-11-05: qty 10

## 2023-11-05 NOTE — ED Notes (Signed)
 Pt was provided urinal, pt attempted to use the urinal but was unsuccessful.

## 2023-11-05 NOTE — ED Provider Notes (Signed)
 [place] EMERGENCY DEPARTMENT AT [facility] Provider Note   CSN: [id number] Arrival date & time: 11/05/23  0730     History Chief Complaint  Patient presents with   Abdominal Pain   Rash    HPI: Jon Williams is a 73 y.o. male with history pertinent for prostate malignancy, HTN, HLD, CHF, CAD, ICD, T2DM, elevated BMI, CKD who presents complaining of abdominal pain and rash. Patient arrived via POV accompanied by wife.  History provided by patient and spouse/partner.  No interpreter required during this encounter.  Patient reports that he has had days of erythematous rash across his abdomen.  Reports that it has been burning as well as pruritic.  Wife reports that she purchased home hydrocortisone and also gave Benadryl  without improvement at home.  Denies any new exposures.  Reports that he is also having 2 days of abdominal pain that worsened this morning.  Denies associated nausea, vomiting, constipation, diarrhea.  Reports that he had his morning of chemo yesterday, however denies any prior radiation.  Denies any fever, chills, chest pain, shortness of breath.  Patient's recorded medical, surgical, social, medication list and allergies were reviewed in the Snapshot window as part of the initial history.   Prior to Admission medications   Medication Sig Start Date End Date Taking? Authorizing Provider  hydrOXYzine  (ATARAX ) 25 MG tablet Take 1/2 tablet (12.5 mg total) by mouth every 8 (eight) hours as needed for up to 6 doses for itching. 11/05/23  Yes Rogelia Jerilynn RAMAN, MD  Accu-Chek Softclix Lancets lancets Use to check your blood sugar finger stick once a day Dx Code E11.9 90 days 05/27/23     amiodarone  (PACERONE ) 200 MG tablet Take 1 tablet (200 mg total) by mouth 2 (two) times daily for 14 days, THEN 1 tablet (200 mg total) daily. 09/16/23 03/28/24  Glena Harlene HERO, FNP  atorvastatin  (LIPITOR) 40 MG tablet Take 1 tablet (40 mg total) by mouth daily. 05/20/23   Glena Harlene HERO, FNP  Biotin 1000 MCG tablet Take 1,000 mcg by mouth daily with breakfast.    [provider]  Blood Glucose Monitoring Suppl (ACCU-CHEK GUIDE) w/Device KIT as directed to check blood sugar once a day 05/27/23     cetirizine  (ZYRTEC ) 10 MG tablet Take 1 tablet (10 mg total) by mouth daily. 08/30/23   Mannie Pac T, DO  cholecalciferol  (VITAMIN D3) 25 MCG (1000 UT) tablet Take 1,000 Units by mouth daily with breakfast.    [provider]  clotrimazole -betamethasone  (LOTRISONE ) cream Apply 1 Application topically 2 (two) times daily. 09/19/23   [provider]  clotrimazole -betamethasone  (LOTRISONE ) cream Apply a thin layer topically 2 (two) times daily for 1 to 2 weeks. 09/19/23     digoxin  (LANOXIN ) 0.125 MG tablet Take 1/2 tablet (0.0625mg  total) by mouth daily. 10/31/23   Milford, Harlene HERO, FNP  empagliflozin  (JARDIANCE ) 10 MG TABS tablet Take 1 tablet (10 mg total) by mouth daily. 09/12/23     EPINEPHrine  0.3 mg/0.3 mL IJ SOAJ injection Inject 0.3 mg into the muscle as needed for anaphylaxis. 07/31/23   [name] Orlan HERO, DO  ferrous sulfate 325 (65 FE) MG tablet Take 325 mg by mouth daily with breakfast.    [provider]  furosemide  (LASIX ) 40 MG tablet Take 1 tablet (40 mg total) by mouth every other day. Alternating with 20 mg. 10/15/23   Marcine Catalan M, PA-C  gabapentin  (NEURONTIN ) 100 MG capsule Take 1 capsule (100 mg total) by  mouth 2 (two) times daily. 09/18/23   Hanford Powell BRAVO, NP  glucose blood (ACCU-CHEK GUIDE TEST) test strip Use to check blood sugars once daily. 05/27/23   [name], MD  ipratropium (ATROVENT ) 0.03 % nasal spray Place 1-2 sprays into both nostrils 2 (two) times daily as needed (nasal drainage). 07/31/23   [name] Orlan HERO, DO  lidocaine -prilocaine  (EMLA ) cream Apply 1 Application topically as needed. 04/03/23   Lanny Callander, MD  lidocaine -prilocaine  (EMLA ) cream Apply to affected area once 10/28/23   Lanny Callander, MD  metFORMIN   (GLUCOPHAGE -XR) 500 MG 24 hr tablet Take 1 tablet (500 mg total) by mouth daily with evening meal. 09/23/23     metoprolol  succinate (TOPROL -XL) 25 MG 24 hr tablet Take 1 tablet (25 mg total) by mouth daily. 03/15/23   Milford, Harlene HERO, FNP  multivitamin (ONE-A-DAY MEN'S) TABS tablet Take 1 tablet by mouth daily with breakfast.    [provider]  ondansetron  (ZOFRAN ) 8 MG tablet Take 1 tablet (8 mg total) by mouth every 8 (eight) hours as needed for nausea or vomiting. 10/28/23   Lanny Callander, MD  pantoprazole  (PROTONIX ) 40 MG tablet Take 1 tablet (40 mg total) by mouth daily. 09/30/23 09/29/24  Odell Celinda Balo, MD  pantoprazole  (PROTONIX ) 40 MG tablet Take 1 tablet (40 mg total) by mouth 2 (two) times daily. 09/30/23   Odell Celinda Balo, MD  potassium chloride  (KLOR-CON ) 10 MEQ tablet Take 2 tablets (20 mEq total) by mouth daily. Patient taking differently: Take 10 mEq by mouth 2 (two) times daily. 08/12/23   Milford, Harlene HERO, FNP  prochlorperazine  (COMPAZINE ) 10 MG tablet Take 1 tablet (10 mg total) by mouth every 6 (six) hours as needed for nausea or vomiting. 10/28/23   Lanny Callander, MD  rivaroxaban  (XARELTO ) 20 MG TABS tablet Take 1 tablet (20 mg total) by mouth daily with supper. 03/15/23   Milford, Harlene HERO, FNP  spironolactone  (ALDACTONE ) 25 MG tablet Take 0.5 tablets (12.5 mg total) by mouth daily. 10/15/23   Marcine Catalan M, PA-C  sucralfate  (CARAFATE ) 1 GM/10ML suspension Take 10 mLs (1 g total) by mouth 4 (four) times daily -  with meals and at bedtime. 11/04/23   Burton, Lacie K, NP  traMADol  (ULTRAM ) 50 MG tablet Take 1 tablet (50 mg total) by mouth every 6 (six) hours as needed. 10/08/23   Burton, Lacie K, NP  valsartan  (DIOVAN ) 40 MG tablet Take 1 tablet (40 mg total) by mouth 2 (two) times daily. 08/13/23 08/12/24  Rolan Ezra RAMAN, MD  valsartan  (DIOVAN ) 40 MG tablet Take 0.5 tablets (20 mg total) by mouth 2 (two) times daily. 10/15/23   Marcine Catalan HERO, PA-C     Allergies:  Aspirin , Sacubitril -valsartan , Sulfa antibiotics, and Ace inhibitors   Review of Systems   ROS as per HPI  Physical Exam Updated Vital Signs BP 105/64 (BP Location: Left Arm)   Pulse (!) 59   Temp 97.9 F (36.6 C) (Oral)   Resp 16   SpO2 100%  Physical Exam Vitals and nursing note reviewed.  Constitutional:      General: He is not in acute distress.    Appearance: He is well-developed.  HENT:     Head: Normocephalic and atraumatic.  Eyes:     Conjunctiva/sclera: Conjunctivae normal.  Cardiovascular:     Rate and Rhythm: Normal rate and regular rhythm.     Heart sounds: No murmur heard. Pulmonary:     Effort: Pulmonary effort is normal. No respiratory  distress.     Breath sounds: Normal breath sounds.  Abdominal:     Palpations: Abdomen is soft.     Tenderness: There is generalized abdominal tenderness (Generalized abdominal pain, most prominent in the bilateral quadrants and suprapubic area).  Musculoskeletal:        General: No swelling.     Cervical back: Neck supple.  Skin:    General: Skin is warm and dry.     Capillary Refill: Capillary refill takes less than 2 seconds.     Findings: Rash (Mildly erythematous macular rash most prominent over the anterior abdomen, primarily right-sided, extending across midline) present.  Neurological:     Mental Status: He is alert.  Psychiatric:        Mood and Affect: Mood normal.     ED Course/ Medical Decision Making/ A&P   Procedures Procedures   Medications Ordered in ED Medications  lactated ringers  bolus 1,000 mL (0 mLs Intravenous Stopped 11/05/23 1057)  morphine  (PF) 4 MG/ML injection 4 mg (4 mg Intravenous Given 11/05/23 1010)  hydrOXYzine  (ATARAX ) tablet 25 mg (25 mg Oral Given 11/05/23 1008)  iohexol  (OMNIPAQUE ) 300 MG/ML solution 100 mL (100 mLs Intravenous Contrast Given 11/05/23 1045)  alum & mag hydroxide-simeth (MAALOX/MYLANTA) 200-200-20 MG/5ML suspension 30 mL (30 mLs Oral Given 11/05/23 1300)    And   lidocaine  (XYLOCAINE ) 2 % viscous mouth solution 15 mL (15 mLs Oral Given 11/05/23 1300)  pantoprazole  (PROTONIX ) injection 40 mg (40 mg Intravenous Given 11/05/23 1300)    Medical Decision Making:   LACEY DOTSON is a 73 y.o. male who presents for ration abdominal pain as per above.  Physical exam is pertinent for diffuse tenderness to palpation of abdomen, most prominent in the lower abdomen, mildly erythematous macular rash across the anterior abdomen, primarily right-sided without vesicles.   The differential includes but is not limited to shingles, drug eruption, dermatitis, SJS/TEN, pancreatitis, bowel obstruction, colitis, nephrolithiasis.  Independent historian: Spouse/partner  External data reviewed: Notes: Reviewed patient's note from urgent care yesterday, that he was.  Labs: Reviewed prior for baseline values  Labs: Ordered, Independent interpretation, and Details: No leukocytosis or thrombocytopenia.  Stable anemia on comparison to prior.  CMP without AKI, emergent electrolyte derangement, emergent LFT abnormality.  Lactic acid WNL.  Lipase WNL.  UA without evidence of UTI with negative LE and nitrites  Radiology: Ordered, Independent interpretation, Details: Personally reviewed CT of the abdomen and pelvis, do not appreciate free fluid, free air, obstructive bowel gas pattern, mass lesion, focal fat stranding/hyperenhancement, and All images reviewed independently.  Agree with radiology report at this time.   CT ABDOMEN PELVIS W CONTRAST Result Date: 11/05/2023 CLINICAL DATA:  Acute abdominal pain, history of prostate cancer and gastric cancer EXAM: CT ABDOMEN AND PELVIS WITH CONTRAST TECHNIQUE: Multidetector CT imaging of the abdomen and pelvis was performed using the standard protocol following bolus administration of intravenous contrast. RADIATION DOSE REDUCTION: This exam was performed according to the departmental dose-optimization program which includes automated exposure  control, adjustment of the mA and/or kV according to patient size and/or use of iterative reconstruction technique. CONTRAST:  OMNIPAQUE  IOHEXOL  300 MG/ML  SOLN COMPARISON:  September 27, 2023 FINDINGS: Lower chest: No acute abnormality. Hepatobiliary: Hyperenhancement of the left hepatic lobe, likely secondary to known SVC obstruction. No suspicious liver lesions. Gallbladder is unremarkable. No biliary ductal dilation. Pancreas: Unremarkable. Spleen: Unremarkable. Adrenals/Urinary Tract: Unchanged 1.5 cm left adrenal nodule, previously characterized as benign adrenal adenoma. Symmetric nephrograms. No hydronephrosis or  nephrolithiasis. Stomach/Bowel: Unchanged nonspecific diffuse gastric wall thickening. No evidence of bowel obstruction. Vascular/Lymphatic: Normal caliber aorta. No lymphadenopathy by size criteria. Reproductive: Fiducial markers in the prostate gland. Other: No free air or free fluid. Small fat containing umbilical hernia and small right inguinal hernia containing nondilated loop of bowel. Musculoskeletal: Chronic bilateral L4 pars defects with grade 1 anterolisthesis of L4 on L5. IMPRESSION: 1. Unchanged nonspecific chronic gastric wall thickening, possibly related to known malignancy versus chronic gastritis. Recommend correlation with endoscopy. 2. Hyperenhancement of the left hepatic lobe, likely related to known SVC obstruction with resultant collateral venous drainage to the liver. No suspicious liver lesions or metastatic disease in the abdomen or pelvis. 3. Unchanged small right inguinal hernia containing nondilated loop of bowel. Unchanged small fat containing umbilical hernia. Electronically Signed   By: Michaeline Blanch M.D.   On: 11/05/2023 11:33   EKG/Medicine tests: Not indicated EKG Interpretation:                  Interventions: Atarax , morphine , LR bolus, Protonix , lidocaine Doree  See the EMR for full details regarding lab and imaging results.  Patient presents for rash  as well as abdominal discomfort.  With regard to rash, this has been ongoing for several days.  Patient has mild pruritic erythematous rash, no specific exposures concerning for contact/allergic dermatitis.  Rash crosses the midline, not vesicular, therefore doubt shingles.  No violaceous patches with blisters, feel that drug reaction less likely.  No sloughing of skin, unlikely SJS/TEN.  Patient given dose of Atarax  with improvement.  Overall unclear underlying etiology of rash, however doubt emergent pathology, discussed with patient and wife that underlying pathology is not fully elucidated, however feel that he is stable for outpatient follow-up for this condition.  They will further discuss with his PCP and oncologist.  With regard to abdominal pain, screening labs obtained and reassuring.  UA without evidence of UTI.  Mesenteric ischemia unlikely in the setting of normal lactic acid.  CT also obtained.  Doubt pancreatitis given normal lipase and no evidence of pancreatitis since.  No evidence of bowel obstruction on CT.  No evidence of nephrolithiasis, cholelithiasis or colitis on exam, CT.  CT does reveal gastric inflammation.  Patient with minimal improvement after morphine , therefore trialed Protonix , Maalox/lidocaine  and on reevaluation, patient has resolution of symptoms.  Given resolution of symptoms after treatment of reflux, feel that patient's discomfort is most likely related to this.  Discussed this with the patient at bedside as well as his wife.  Discussed 2-week course of PPI followed by H2 blocker and further symptomatic treatment is needed.  Patient and wife expressed understanding, discharged in stable condition.  Presentation is most consistent with acute complicated illness, Current presentation is complicated by underlying chronic conditions, and I did consider and rule out acute life/limb-threatening illness  Discussion of management or test interpretations with external provider(s):  Not indicated  Risk Drugs:OTC drugs, Prescription drug management, and Parenteral controlled substances  Disposition: DISCHARGE: I believe that the patient is safe for discharge home with outpatient follow-up. Patient was informed of all pertinent physical exam, laboratory, and imaging findings. Patient's suspected etiology of their symptom presentation was discussed with the patient and all questions were answered. We discussed following up with PCP, oncologist. I provided thorough ED return precautions. The patient feels safe and comfortable with this plan.  MDM generated using voice dictation software and may contain dictation errors.  Please contact me for any clarification or with any questions.  Clinical Impression:  1. Acute gastritis, presence of bleeding unspecified, unspecified gastritis type   2. Rash      Discharge   Final Clinical Impression(s) / ED Diagnoses Final diagnoses:  Acute gastritis, presence of bleeding unspecified, unspecified gastritis type  Rash    Rx / DC Orders ED Discharge Orders          Ordered    hydrOXYzine  (ATARAX ) 25 MG tablet  Every 8 hours PRN        11/05/23 1353             Rogelia Jerilynn RAMAN, MD 11/08/23 [phone]

## 2023-11-05 NOTE — Discharge Instructions (Addendum)
 Jon Williams  Thank you for allowing us  to take care of you today.  You came to the Emergency Department today because you have had several days of rash and belly pain which was severe today.  Here in the emergency department.  Rash does not appear to be one of the emergency forms of rashes such as shingles.  If you develop blisters, develop peeling of the skin, I be more concerning for a dangerous type of rash, to be a reason to come back to the emergency department.  We recommend continuing to use the hydrocortisone.  We are prescribing you a short course of Atarax  for itching, this medication can make older adults more prone to falls, therefore you should use it sparingly and only if you are having severe itching that is not responding to the hydrocortisone.  We also encourage you to discuss this rash with your oncologist, and make sure that it is not related to your cancer and your chemotherapy.  With regard to abdominal pain, your labs are reassuring, we did a CT which showed some inflammation of your stomach which can be caused by reflux.  Here in the emergency department we gave you some antireflux medication and you had significant improvement in your pain, therefore we feel that reflux is most likely cause of your pain.  We recommend taking a proton pump inhibitor (such as Prilosec, omeprazole, etc. which can be bought over-the-counter at most grocery stores and pharmacies) for 2 weeks.  Thereafter you can try going off the medication, if your symptoms do not return you do not need to take any prophy medications, however if you have repeat reflux symptoms, you can take a H2 blocker such as Pepcid  which can also be bought over-the-counter, and continue this medication long-term.  To-Do: 1. Please follow-up with your primary doctor within 5 days / as soon as possible.   Please return to the Emergency Department or call 911 if you experience have worsening of your symptoms, or do not get better,  chest pain, shortness of breath, severe or significantly worsening pain, high fever, severe confusion, pass out or have any reason to think that you need emergency medical care.   We hope you feel better soon.   Mitzie Later, MD Department of Emergency Medicine [facility]

## 2023-11-05 NOTE — ED Notes (Signed)
 EDP into room, at [facility]. Wife at [facility]. Pt resting/ sleeping, NAD, calm, interactive. Mentions pain and intermittent nausea. Relates sx to CA and/ or CA tx.

## 2023-11-05 NOTE — ED Triage Notes (Signed)
 Pt reports with abdominal pain that has been going on for a while. Pt has a rash to his abdomen and knees that is itching and burning. First cancer tx was last Monday.

## 2023-11-05 NOTE — ED Notes (Addendum)
 Back from CT, no obvious changes. Alert, NAD, calm, interactive. Wife at [facility].

## 2023-11-06 ENCOUNTER — Other Ambulatory Visit (HOSPITAL_COMMUNITY)

## 2023-11-06 DIAGNOSIS — N1831 Chronic kidney disease, stage 3a: Secondary | ICD-10-CM | POA: Diagnosis not present

## 2023-11-06 DIAGNOSIS — N179 Acute kidney failure, unspecified: Secondary | ICD-10-CM | POA: Diagnosis not present

## 2023-11-06 DIAGNOSIS — I5042 Chronic combined systolic (congestive) and diastolic (congestive) heart failure: Secondary | ICD-10-CM | POA: Diagnosis not present

## 2023-11-06 DIAGNOSIS — I4892 Unspecified atrial flutter: Secondary | ICD-10-CM | POA: Diagnosis not present

## 2023-11-06 DIAGNOSIS — E1142 Type 2 diabetes mellitus with diabetic polyneuropathy: Secondary | ICD-10-CM | POA: Diagnosis not present

## 2023-11-06 DIAGNOSIS — E1122 Type 2 diabetes mellitus with diabetic chronic kidney disease: Secondary | ICD-10-CM | POA: Diagnosis not present

## 2023-11-06 DIAGNOSIS — E1165 Type 2 diabetes mellitus with hyperglycemia: Secondary | ICD-10-CM | POA: Diagnosis not present

## 2023-11-06 DIAGNOSIS — I482 Chronic atrial fibrillation, unspecified: Secondary | ICD-10-CM | POA: Diagnosis not present

## 2023-11-06 DIAGNOSIS — I130 Hypertensive heart and chronic kidney disease with heart failure and stage 1 through stage 4 chronic kidney disease, or unspecified chronic kidney disease: Secondary | ICD-10-CM | POA: Diagnosis not present

## 2023-11-08 ENCOUNTER — Other Ambulatory Visit: Payer: Self-pay | Admitting: Hematology

## 2023-11-08 ENCOUNTER — Encounter: Payer: Self-pay | Admitting: Hematology

## 2023-11-08 DIAGNOSIS — C163 Malignant neoplasm of pyloric antrum: Secondary | ICD-10-CM

## 2023-11-10 NOTE — Assessment & Plan Note (Signed)
-  diagnosed with Gleason 4+4 prostate cancer in early 2019. He was treated with long-term ADT in combination with 8 weeks of IMRT.

## 2023-11-10 NOTE — Assessment & Plan Note (Signed)
 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  S/p FLOT4 01/02/2021 - 03/22/2021 -Posttreatment EUS 05/2021 and 06/29/2022 by Dr. Rollin showed a scar and residual erythema in stomach, biopsy negative for residual malignancy.  -Jon Williams is clinically doing well on surveillance. Last CT 03/20/22 which shows no evidence of recurrent or metastatic disease, and stable bilateral adrenal adenomas -Surveillance CT scan from October 09, 2022 showed no evidence of recurrence -Surveillance CT scan from April 30, 2023 showed no evidence of residual or recurrent malignancy.  Small liver lesion is stable, favored to be benign. -He unfortunately had local recurrence in 09/2023 -he started Nivo 240mg  q2w and Ipi 1mg /kg q6w on 10/28/2023

## 2023-11-11 ENCOUNTER — Other Ambulatory Visit (HOSPITAL_COMMUNITY): Payer: Self-pay

## 2023-11-11 ENCOUNTER — Inpatient Hospital Stay: Admitting: Hematology

## 2023-11-11 ENCOUNTER — Inpatient Hospital Stay

## 2023-11-11 VITALS — BP 102/62 | HR 54 | Temp 98.0°F | Resp 18

## 2023-11-11 DIAGNOSIS — D509 Iron deficiency anemia, unspecified: Secondary | ICD-10-CM

## 2023-11-11 DIAGNOSIS — C61 Malignant neoplasm of prostate: Secondary | ICD-10-CM | POA: Diagnosis not present

## 2023-11-11 DIAGNOSIS — Z79620 Long term (current) use of immunosuppressive biologic: Secondary | ICD-10-CM | POA: Diagnosis not present

## 2023-11-11 DIAGNOSIS — C163 Malignant neoplasm of pyloric antrum: Secondary | ICD-10-CM

## 2023-11-11 DIAGNOSIS — R1013 Epigastric pain: Secondary | ICD-10-CM | POA: Diagnosis not present

## 2023-11-11 DIAGNOSIS — Z5112 Encounter for antineoplastic immunotherapy: Secondary | ICD-10-CM | POA: Diagnosis not present

## 2023-11-11 LAB — CBC WITH DIFFERENTIAL (CANCER CENTER ONLY)
Abs Immature Granulocytes: 0.02 K/uL (ref 0.00–0.07)
Basophils Absolute: 0 K/uL (ref 0.0–0.1)
Basophils Relative: 0 %
Eosinophils Absolute: 0.2 K/uL (ref 0.0–0.5)
Eosinophils Relative: 2 %
HCT: 28.5 % — ABNORMAL LOW (ref 39.0–52.0)
Hemoglobin: 9 g/dL — ABNORMAL LOW (ref 13.0–17.0)
Immature Granulocytes: 0 %
Lymphocytes Relative: 5 %
Lymphs Abs: 0.5 K/uL — ABNORMAL LOW (ref 0.7–4.0)
MCH: 27.5 pg (ref 26.0–34.0)
MCHC: 31.6 g/dL (ref 30.0–36.0)
MCV: 87.2 fL (ref 80.0–100.0)
Monocytes Absolute: 0.8 K/uL (ref 0.1–1.0)
Monocytes Relative: 8 %
Neutro Abs: 8.6 K/uL — ABNORMAL HIGH (ref 1.7–7.7)
Neutrophils Relative %: 85 %
Platelet Count: 356 K/uL (ref 150–400)
RBC: 3.27 MIL/uL — ABNORMAL LOW (ref 4.22–5.81)
RDW: 15 % (ref 11.5–15.5)
WBC Count: 10.2 K/uL (ref 4.0–10.5)
nRBC: 0 % (ref 0.0–0.2)

## 2023-11-11 LAB — CMP (CANCER CENTER ONLY)
ALT: 12 U/L (ref 0–44)
AST: 13 U/L — ABNORMAL LOW (ref 15–41)
Albumin: 3.5 g/dL (ref 3.5–5.0)
Alkaline Phosphatase: 48 U/L (ref 38–126)
Anion gap: 7 (ref 5–15)
BUN: 25 mg/dL — ABNORMAL HIGH (ref 8–23)
CO2: 27 mmol/L (ref 22–32)
Calcium: 8.7 mg/dL — ABNORMAL LOW (ref 8.9–10.3)
Chloride: 98 mmol/L (ref 98–111)
Creatinine: 1.2 mg/dL (ref 0.61–1.24)
GFR, Estimated: >60 mL/min (ref >60)
Glucose, Bld: 102 mg/dL — ABNORMAL HIGH (ref 70–99)
Potassium: 4.3 mmol/L (ref 3.5–5.1)
Sodium: 132 mmol/L — ABNORMAL LOW (ref 135–145)
Total Bilirubin: 0.4 mg/dL (ref 0.0–1.2)
Total Protein: 6.4 g/dL — ABNORMAL LOW (ref 6.5–8.1)

## 2023-11-11 LAB — FERRITIN: Ferritin: 42 ng/mL (ref 24–336)

## 2023-11-11 MED ORDER — TRAMADOL HCL 50 MG PO TABS
50.0000 mg | ORAL_TABLET | Freq: Four times a day (QID) | ORAL | 0 refills | Status: AC | PRN
  Filled 2023-11-11: qty 60, 15d supply, fill #0

## 2023-11-11 MED ORDER — SODIUM CHLORIDE 0.9 % IV SOLN
240.0000 mg | Freq: Once | INTRAVENOUS | Status: AC
  Administered 2023-11-11: 240 mg via INTRAVENOUS
  Filled 2023-11-11: qty 24

## 2023-11-11 MED ORDER — SODIUM CHLORIDE 0.9 % IV SOLN: INTRAVENOUS | Status: DC

## 2023-11-11 NOTE — Progress Notes (Signed)
 [facility]   [phone] Fax:([phone]) [phone]   Clinic Follow up Note   Patient Care Team: [name], MD as PCP - General (Internal Medicine) [name], Aleene PARAS, MD (Inactive) as PCP - Cardiology (Cardiology) Waddell Danelle ORN, MD as PCP - Electrophysiology (Cardiology) Dasie Leonor CROME, MD as Consulting Physician (General Surgery) Lanny Callander, MD as Consulting Physician (Hematology) Rollin Dover, MD as Consulting Physician (Gastroenterology)  Date of Service:  11/11/2023  CHIEF COMPLAINT: f/u of gastric cancer  CURRENT THERAPY:  Nivolumab  and ipilimumab   Oncology History   Malignant neoplasm of prostate [facility]) -diagnosed with Gleason 4+4 prostate cancer in early 2019. He was treated with long-term ADT in combination with 8 weeks of IMRT.   Gastric cancer (HCC) cT3N0M0, stage II -Diagnosed 11/2020, path showed loss of MLH1 and PMS2 which predicts good response to immunotherapy  -Deemed a high risk surgical candidate.  S/p FLOT4 01/02/2021 - 03/22/2021 -Posttreatment EUS 05/2021 and 06/29/2022 by Dr. Rollin showed a scar and residual erythema in stomach, biopsy negative for residual malignancy.  -[name] is clinically doing well on surveillance. Last CT 03/20/22 which shows no evidence of recurrent or metastatic disease, and stable bilateral adrenal adenomas -Surveillance CT scan from October 09, 2022 showed no evidence of recurrence -Surveillance CT scan from April 30, 2023 showed no evidence of residual or recurrent malignancy.  Small liver lesion is stable, favored to be benign. -He unfortunately had local recurrence in 09/2023 -he started Nivo 240mg  q2w and Ipi 1mg /kg q6w on 10/28/2023  Assessment & Plan Recurrent gastric cancer Recurrent gastric cancer with ongoing treatment. Current regimen involves administration of one drug every two weeks, with a second drug added every six weeks. The treatment is relatively tolerable due to the low dose and spacing of  the second drug. A scan is planned after three months to assess response, and an endoscopy is anticipated in six to twelve months. The treatment is expected to provide a good response due to specific features of the cancer. - Administer one drug every two weeks - Administer second drug every six weeks - Repeat scan in three months - Plan endoscopy in six to twelve months  Cancer-related pain Intermittent severe abdominal pain, rated 8/10, likely related to recurrent gastric cancer. Pain is managed with tramadol , which provides relief. Discussed the potential for pain improvement with effective cancer treatment. - Refill tramadol  prescription - Continue pain management with tramadol  as needed  Mild anemia Mild anemia noted on recent blood tests.   Plan -I reviewed his ED visit lab and scan -today's Lab reviewed, adequate for treatment, will proceed second dose nivolumab  today - He will continue use of tramadol  as needed for gastric pain - Follow-up in 2 weeks before treatment  SUMMARY OF ONCOLOGIC HISTORY: Oncology History Overview Note  Cancer Staging Gastric cancer [facility]) Staging form: Stomach, AJCC 8th Edition - Clinical stage from 12/01/2020: Stage IIB (cT3, cN0, cM0) - Signed by Lanny Callander, MD on 12/20/2020 Stage prefix: Initial diagnosis Total positive nodes: 0  Malignant neoplasm of prostate (HCC) Staging form: Prostate, AJCC 8th Edition - Clinical: Stage IIC (cT2b, cN0, cM0, PSA: 6.7, Grade Group: 4) - Unsigned Prostate specific antigen (PSA) range: Less than 10 Gleason score: 8 Histologic grading system: 5 grade system    Gastric cancer (HCC)  12/01/2020 Procedure   Upper Endoscopy, Dr. Rollin  Impression: - Normal esophagus. 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 Biopsied. - Normal examined duodenum.   12/01/2020 Pathology Results  FINAL MICROSCOPIC DIAGNOSIS:   A. INCISURA, BIOPSY:  - Adenocarcinoma, moderate to poorly differentiated arising in a   background of chronic gastritis with intestinal metaplasia.    12/01/2020 Imaging   CT CAP  IMPRESSION: Focal wall thickening along the posterior aspect of the gastric antrum, likely corresponding to the patient's newly diagnosed gastric cancer.   No findings suspicious for metastatic disease.   Mild multifocal pneumonia, lower lobe predominant, likely on the basis of aspiration. Trace right pleural effusion.   Fiducial markers along the prostate in this patient with known prostate cancer.   12/01/2020 Cancer Staging   Staging form: Stomach, AJCC 8th Edition - Clinical stage from 12/01/2020: Stage IIB (cT3, cN0, cM0) - Signed by Lanny Callander, MD on 12/20/2020 Stage prefix: Initial diagnosis Total positive nodes: 0   12/08/2020 Initial Diagnosis   Gastric cancer (HCC)   12/16/2020 Procedure   EUS  - Wall thickening was seen in the lesser curve of the stomach and in the antrum of the stomach. The thickening appeared to be primarily within the serosa (Layer 5). T3 N0 Mx. - There was no sign of significant pathology in the entire pancreas. - There was no sign of significant pathology in the common bile duct and in the gallbladder. - There was no evidence of significant pathology in the left lobe of the liver. - Endosonographic images of the left adrenal gland were unremarkable. - The celiac trunk and superior mesenteric artery were endosonographically normal. - The mediastinum was unremarkable endosonographically. - No specimens collected.   01/02/2021 - 03/23/2021 Chemotherapy   Patient is on Treatment Plan : GASTROESOPHAGEAL FLOT q14d X 4 cycles     04/01/2021 Imaging   EXAM: CT ABDOMEN AND PELVIS WITHOUT CONTRAST  IMPRESSION: 1. No acute findings in the abdomen or pelvis. Specifically, no evidence for metastatic disease in the abdomen or pelvis. 2. Stable 16 mm left adrenal adenoma. 3. Small fat containing hernias in the right groin and umbilical region. 4. Bilateral pars  interarticularis defects at L4 with 10 mm anterolisthesis of L4 on 5, stable. 5. Aortic Atherosclerosis (ICD10-I70.0).   04/13/2021 Imaging   EXAM: CT CHEST WITHOUT CONTRAST  IMPRESSION: 1. No evidence of metastatic disease in the chest. 2. Enlargement of the main pulmonary artery, as can be seen in pulmonary hypertension. 3. Coronary artery disease.   Aortic Atherosclerosis (ICD10-I70.0).   06/09/2021 Procedure   Upper GI Endoscopy, Dr. Rollin  Findings: -The esophagus was normal. -A medium healed ulcer was found on the lesser curvature of the stomach. Biopsies were taken with a cold forceps for histology. -The examined duodenum was normal. -Compared to the prior EGD there is a marked improvement in his gastric cancer. There was only evidence of erythema and a central scar at the incisura. Cold biopsies of the area were obtained, but there was no gross evidence of malignancy.  Impression: - Normal esophagus. - Scar in the lesser curvature of the stomach. Biopsied. - Normal examined duodenum.   06/09/2021 Pathology Results   FINAL MICROSCOPIC DIAGNOSIS:   A. STOMACH, BIOPSY:  - Gastric mucosa with acute and chronic inflammation.  - No dysplasia or malignancy identified.    06/16/2021 - 06/16/2021 Chemotherapy   Patient is on Treatment Plan : GASTROESOPHAGEAL Pembrolizumab (200) q21d     10/28/2023 -  Chemotherapy   Patient is on Treatment Plan : GASTRIC Nivolumab  (240) D1,15,29 + Ipilimumab  (1) D1 q42d        Discussed the use of AI scribe software for  clinical note transcription with the patient, who gave verbal consent to proceed.  History of Present Illness Jon Williams is a 73 year old male with recurrent gastric cancer who presents for follow-up.  He experiences intermittent stomach pain, rated as 8 out of 10, requiring tramadol  for relief. He requests a refill for this medication. No constipation is present. His appetite is variable, with weight fluctuations between  50 and 52 kilograms over the past year or two, without significant weight loss. Energy levels are low, but he remains active by walking inside the house.  He has a PICC line for treatment, with weekly dressing changes, and a previous port was removed. He is undergoing treatment every two weeks, with one drug administered today and a second drug every six weeks. No significant side effects from the treatment, such as skin rash or joint pain, are reported. A previous rash on his back resolved with cream, leaving dry skin, and there is no current itching or tenderness.     All other systems were reviewed with the patient and are negative.  MEDICAL HISTORY:  Past Medical History:  Diagnosis Date   AICD (automatic cardioverter/defibrillator) present 05/25/2016   biv icd   Angio-edema    Bunion, right    Chronic combined systolic and diastolic CHF (congestive heart failure) (HCC)    Echo 1/18: Mild conc LVH, EF 15-20, severe diff HK, inf and inf-septal AK, Gr 3 DD, mild to mod MR, severe LAE, mod reduced RVSF, mod RAE, mild TR, PASP 50   Coronary artery disease involving native coronary artery without angina pectoris 04/17/2016   LHC 1/18: pLCx 30, mLCx 20, mRCA 40, dRCA 20, LVEDP 23, mean RA 8, PA 42/20, PCWP 17   Diabetes mellitus without complication (HCC)    Gastric cancer (HCC)    History of atrial fibrillation    History of atrial flutter    History of cardiomegaly 06/07/2016   Noted on CXR   History of colon polyps 06/28/2017   Noted on colonoscopy   Hypertension    LBBB (left bundle branch block)    Nausea vomiting and diarrhea 04/04/2021   NICM (nonischemic cardiomyopathy) (HCC)    Echo 1/18:  Mild conc LVH, EF 15-20, severe diff HK, inf and inf-septal AK, Gr 3 DD, mild to mod MR, severe LAE, mod reduced RVSF, mod RAE, mild TR, PASP 50   Other secondary pulmonary hypertension (HCC) 04/17/2016   Prostate cancer (HCC) 2019   Sigmoid diverticulosis 06/28/2017   Noted on colonoscopy     SURGICAL HISTORY: Past Surgical History:  Procedure Laterality Date   BIOPSY  12/01/2020   Procedure: BIOPSY;  Surgeon: Rollin Dover, MD;  Location: [facility] ENDOSCOPY;  Service: Endoscopy;;   BIOPSY  06/09/2021   Procedure: BIOPSY;  Surgeon: Rollin Dover, MD;  Location: [facility] ENDOSCOPY;  Service: Gastroenterology;;   BIOPSY  06/29/2022   Procedure: BIOPSY;  Surgeon: Rollin Dover, MD;  Location: [facility] ENDOSCOPY;  Service: Gastroenterology;;   BIV ICD INSERTION CRT-D N/A 05/25/2016   Procedure: BiV ICD Insertion CRT-D;  Surgeon: Danelle LELON Birmingham, MD;  Location: [facility] INVASIVE CV LAB;  Service: Cardiovascular;  Laterality: N/A;   CARDIAC CATHETERIZATION N/A 03/02/2016   Procedure: Right/Left Heart Cath and Coronary Angiography;  Surgeon: Lonni Hanson, MD;  Location: [facility] INVASIVE CV LAB;  Service: Cardiovascular;  Laterality: N/A;   CARDIOVERSION N/A 07/17/2016   Procedure: Cardioversion;  Surgeon: Birmingham Danelle LELON, MD;  Location: [facility] INVASIVE CV LAB;  Service: Cardiovascular;  Laterality: N/A;  COLONOSCOPY WITH PROPOFOL  N/A 06/28/2017   Procedure: COLONOSCOPY WITH PROPOFOL ;  Surgeon: Rollin Dover, MD;  Location: [facility] ENDOSCOPY;  Service: Endoscopy;  Laterality: N/A;   colonscopy  2009   ESOPHAGOGASTRODUODENOSCOPY N/A 12/01/2020   Procedure: ESOPHAGOGASTRODUODENOSCOPY (EGD);  Surgeon: Rollin Dover, MD;  Location: [facility] ENDOSCOPY;  Service: Endoscopy;  Laterality: N/A;  IDA/guaiac positive stools   ESOPHAGOGASTRODUODENOSCOPY N/A 10/04/2023   Procedure: EGD (ESOPHAGOGASTRODUODENOSCOPY);  Surgeon: Rollin Dover, MD;  Location: [facility] ENDOSCOPY;  Service: Gastroenterology;  Laterality: N/A;   ESOPHAGOGASTRODUODENOSCOPY (EGD) WITH PROPOFOL  N/A 12/16/2020   Procedure: ESOPHAGOGASTRODUODENOSCOPY (EGD) WITH PROPOFOL ;  Surgeon: Rollin Dover, MD;  Location: [facility] ENDOSCOPY;  Service: Endoscopy;  Laterality: N/A;   ESOPHAGOGASTRODUODENOSCOPY (EGD) WITH PROPOFOL  N/A 06/09/2021   Procedure: ESOPHAGOGASTRODUODENOSCOPY (EGD) WITH  PROPOFOL ;  Surgeon: Rollin Dover, MD;  Location: [facility] ENDOSCOPY;  Service: Gastroenterology;  Laterality: N/A;   ESOPHAGOGASTRODUODENOSCOPY (EGD) WITH PROPOFOL  N/A 06/29/2022   Procedure: ESOPHAGOGASTRODUODENOSCOPY (EGD) WITH PROPOFOL ;  Surgeon: Rollin Dover, MD;  Location: [facility] ENDOSCOPY;  Service: Gastroenterology;  Laterality: N/A;   GOLD SEED IMPLANT N/A 12/24/2017   Procedure: GOLD SEED IMPLANT, TRANSERINEAL;  Surgeon: Nieves Cough, MD;  Location: [facility] ORS;  Service: Urology;  Laterality: N/A;   INSERT / REPLACE / REMOVE PACEMAKER     IR ANGIOGRAM SELECTIVE EACH ADDITIONAL VESSEL  11/04/2023   IR ANGIOGRAM SELECTIVE EACH ADDITIONAL VESSEL  11/04/2023   IR CV LINE INJECTION  10/25/2023   IR REMOVAL TUN ACCESS W/ PORT W/O FL MOD SED  11/04/2023   IR US  GUIDE VASC ACCESS RIGHT  11/04/2023   IR VENOCAVAGRAM SVC  11/04/2023   LEAD REVISION  10/10/2018   LEAD REVISION/REPAIR N/A 10/10/2018   Procedure: LEAD REVISION/REPAIR;  Surgeon: Waddell Danelle ORN, MD;  Location: [facility] INVASIVE CV LAB;  Service: Cardiovascular;  Laterality: N/A;   POLYPECTOMY  06/28/2017   Procedure: POLYPECTOMY;  Surgeon: Rollin Dover, MD;  Location: [facility] ENDOSCOPY;  Service: Endoscopy;;  ascending and descending colon polyp   PORTACATH PLACEMENT Right 12/23/2020   Procedure: INSERTION PORT-A-CATH;  Surgeon: Dasie Leonor CROME, MD;  Location: [facility] OR;  Service: General;  Laterality: Right;   PROSTATE BIOPSY  02/20/2017   RIGHT HEART CATH N/A 07/25/2023   Procedure: RIGHT HEART CATH;  Surgeon: Rolan Ezra RAMAN, MD;  Location: [facility] INVASIVE CV LAB;  Service: Cardiovascular;  Laterality: N/A;   RIGHT/LEFT HEART CATH AND CORONARY ANGIOGRAPHY N/A 10/30/2022   Procedure: RIGHT/LEFT HEART CATH AND CORONARY ANGIOGRAPHY;  Surgeon: Rolan Ezra RAMAN, MD;  Location: [facility] INVASIVE CV LAB;  Service: Cardiovascular;  Laterality: N/A;   SPACE OAR INSTILLATION N/A 12/24/2017   Procedure: SPACE OAR INSTILLATION;  Surgeon: Nieves Cough, MD;  Location: [facility] ORS;   Service: Urology;  Laterality: N/A;   TOTAL KNEE ARTHROPLASTY Right 08/16/2017   Procedure: RIGHT TOTAL KNEE ARTHROPLASTY;  Surgeon: Shari Sieving, MD;  Location: [facility] OR;  Service: Orthopedics;  Laterality: Right;   UPPER ESOPHAGEAL ENDOSCOPIC ULTRASOUND (EUS) N/A 12/16/2020   Procedure: UPPER ESOPHAGEAL ENDOSCOPIC ULTRASOUND (EUS);  Surgeon: Rollin Dover, MD;  Location: [facility]OSCOPY;  Service: Endoscopy;  Laterality: N/A;    I have reviewed the social history and family history with the patient and they are unchanged from previous note.  ALLERGIES:  is allergic to aspirin , sacubitril -valsartan , sulfa antibiotics, and ace inhibitors.  MEDICATIONS:  Current Outpatient Medications  Medication Sig Dispense Refill   Accu-Chek Softclix Lancets lancets Use to check your blood sugar finger stick once a day Dx Code E11.9 90 days 100 each 3  amiodarone  (PACERONE ) 200 MG tablet Take 1 tablet (200 mg total) by mouth 2 (two) times daily for 14 days, THEN 1 tablet (200 mg total) daily. 180 tablet 3   atorvastatin  (LIPITOR) 40 MG tablet Take 1 tablet (40 mg total) by mouth daily. 90 tablet 3   Biotin 1000 MCG tablet Take 1,000 mcg by mouth daily with breakfast.     Blood Glucose Monitoring Suppl (ACCU-CHEK GUIDE) w/Device KIT as directed to check blood sugar once a day 1 kit 0   cetirizine  (ZYRTEC ) 10 MG tablet Take 1 tablet (10 mg total) by mouth daily. 30 tablet 0   cholecalciferol  (VITAMIN D3) 25 MCG (1000 UT) tablet Take 1,000 Units by mouth daily with breakfast.     clotrimazole -betamethasone  (LOTRISONE ) cream Apply 1 Application topically 2 (two) times daily.     clotrimazole -betamethasone  (LOTRISONE ) cream Apply a thin layer topically 2 (two) times daily for 1 to 2 weeks. 30 g 1   digoxin  (LANOXIN ) 0.125 MG tablet Take 1/2 tablet (0.0625mg  total) by mouth daily. 15 tablet 6   empagliflozin  (JARDIANCE ) 10 MG TABS tablet Take 1 tablet (10 mg total) by mouth daily. 90 tablet 3   EPINEPHrine  0.3  mg/0.3 mL IJ SOAJ injection Inject 0.3 mg into the muscle as needed for anaphylaxis. 2 each 1   ferrous sulfate 325 (65 FE) MG tablet Take 325 mg by mouth daily with breakfast.     furosemide  (LASIX ) 40 MG tablet Take 1 tablet (40 mg total) by mouth every other day. Alternating with 20 mg. 30 tablet 6   gabapentin  (NEURONTIN ) 100 MG capsule Take 1 capsule (100 mg total) by mouth 2 (two) times daily. 180 capsule 1   glucose blood (ACCU-CHEK GUIDE TEST) test strip Use to check blood sugars once daily. 100 each 3   hydrOXYzine  (ATARAX ) 25 MG tablet Take 1/2 tablet (12.5 mg total) by mouth every 8 (eight) hours as needed for up to 6 doses for itching. 3 tablet 0   ipratropium (ATROVENT ) 0.03 % nasal spray Place 1-2 sprays into both nostrils 2 (two) times daily as needed (nasal drainage). 30 mL 2   lidocaine -prilocaine  (EMLA ) cream Apply 1 Application topically as needed. 30 g 0   lidocaine -prilocaine  (EMLA ) cream Apply to affected area once 30 g 3   metFORMIN  (GLUCOPHAGE -XR) 500 MG 24 hr tablet Take 1 tablet (500 mg total) by mouth daily with evening meal. 90 tablet 3   metoprolol  succinate (TOPROL -XL) 25 MG 24 hr tablet Take 1 tablet (25 mg total) by mouth daily. 90 tablet 3   multivitamin (ONE-A-DAY MEN'S) TABS tablet Take 1 tablet by mouth daily with breakfast.     ondansetron  (ZOFRAN ) 8 MG tablet Take 1 tablet (8 mg total) by mouth every 8 (eight) hours as needed for nausea or vomiting. 30 tablet 1   pantoprazole  (PROTONIX ) 40 MG tablet Take 1 tablet (40 mg total) by mouth daily. 30 tablet 1   pantoprazole  (PROTONIX ) 40 MG tablet Take 1 tablet (40 mg total) by mouth 2 (two) times daily. 180 tablet 3   potassium chloride  (KLOR-CON ) 10 MEQ tablet Take 2 tablets (20 mEq total) by mouth daily. (Patient taking differently: Take 10 mEq by mouth 2 (two) times daily.) 60 tablet 3   prochlorperazine  (COMPAZINE ) 10 MG tablet Take 1 tablet (10 mg total) by mouth every 6 (six) hours as needed for nausea or  vomiting. 30 tablet 1   rivaroxaban  (XARELTO ) 20 MG TABS tablet Take 1 tablet (20 mg total) by  mouth daily with supper. 90 tablet 3   spironolactone  (ALDACTONE ) 25 MG tablet Take 0.5 tablets (12.5 mg total) by mouth daily.     sucralfate  (CARAFATE ) 1 GM/10ML suspension Take 10 mLs (1 g total) by mouth 4 (four) times daily -  with meals and at bedtime. 420 mL 1   traMADol  (ULTRAM ) 50 MG tablet Take 1 tablet (50 mg total) by mouth every 6 (six) hours as needed. 60 tablet 0   valsartan  (DIOVAN ) 40 MG tablet Take 1 tablet (40 mg total) by mouth 2 (two) times daily. 60 tablet 11   valsartan  (DIOVAN ) 40 MG tablet Take 0.5 tablets (20 mg total) by mouth 2 (two) times daily.     No current facility-administered medications for this visit.   Facility-Administered Medications Ordered in Other Visits  Medication Dose Route Frequency Provider Last Rate Last Admin   0.9 %  sodium chloride  infusion   Intravenous Continuous Lanny Callander, MD   Stopped at 11/11/23 1054    PHYSICAL EXAMINATION: ECOG PERFORMANCE STATUS: 1 - Symptomatic but completely ambulatory  There were no vitals filed for this visit. Wt Readings from Last 3 Encounters:  11/04/23 155 lb (70.3 kg)  10/28/23 151 lb 6.4 oz (68.7 kg)  10/15/23 155 lb 3.2 oz (70.4 kg)     GENERAL:alert, no distress and comfortable SKIN: skin color, texture, turgor are normal except dry skins, no rashes or significant lesions EYES: normal, Conjunctiva are pink and non-injected, sclera clear NECK: supple, thyroid  normal size, non-tender, without nodularity LYMPH:  no palpable lymphadenopathy in the cervical, axillary  LUNGS: clear to auscultation and percussion with normal breathing effort HEART: regular rate & rhythm and no murmurs and no lower extremity edema ABDOMEN:abdomen soft, non-tender and normal bowel sounds Musculoskeletal:no cyanosis of digits and no clubbing  NEURO: alert & oriented x 3 with fluent speech, no focal motor/sensory  deficits  Physical Exam   LABORATORY DATA:  I have reviewed the data as listed    Latest Ref Rng & Units 11/11/2023    7:32 AM 11/05/2023   10:17 AM 11/05/2023    7:45 AM  CBC  WBC 4.0 - 10.5 K/uL 10.2   8.4   Hemoglobin 13.0 - 17.0 g/dL 9.0  9.9  9.7   Hematocrit 39.0 - 52.0 % 28.5  29.0  33.2   Platelets 150 - 400 K/uL 356   365         Latest Ref Rng & Units 11/11/2023    7:32 AM 11/05/2023   10:17 AM 11/05/2023    7:45 AM  CMP  Glucose 70 - 99 mg/dL 897  895  884   BUN 8 - 23 mg/dL 25  20  21    Creatinine 0.61 - 1.24 mg/dL 8.79  8.89  8.83   Sodium 135 - 145 mmol/L 132  135  133   Potassium 3.5 - 5.1 mmol/L 4.3  4.3  4.6   Chloride 98 - 111 mmol/L 98  103  99   CO2 22 - 32 mmol/L 27   19   Calcium  8.9 - 10.3 mg/dL 8.7   9.4   Total Protein 6.5 - 8.1 g/dL 6.4   6.9   Total Bilirubin 0.0 - 1.2 mg/dL 0.4   0.4   Alkaline Phos 38 - 126 U/L 48   57   AST 15 - 41 U/L 13   20   ALT 0 - 44 U/L 12   16       RADIOGRAPHIC  STUDIES: I have personally reviewed the radiological images as listed and agreed with the findings in the report. No results found.    Orders Placed This Encounter  Procedures   CBC with Differential (Cancer Center Only)    Standing Status:   Future    Expected Date:   01/20/2024    Expiration Date:   01/19/2025   CMP (Cancer Center only)    Standing Status:   Future    Expected Date:   01/20/2024    Expiration Date:   01/19/2025   T4    Standing Status:   Future    Expected Date:   01/20/2024    Expiration Date:   01/19/2025   TSH    Standing Status:   Future    Expected Date:   01/20/2024    Expiration Date:   01/19/2025   CBC with Differential (Cancer Center Only)    Standing Status:   Future    Expected Date:   02/03/2024    Expiration Date:   02/02/2025   CMP (Cancer Center only)    Standing Status:   Future    Expected Date:   02/03/2024    Expiration Date:   02/02/2025   CBC with Differential (Cancer Center Only)    Standing Status:    Future    Expected Date:   02/17/2024    Expiration Date:   02/16/2025   CMP (Cancer Center only)    Standing Status:   Future    Expected Date:   02/17/2024    Expiration Date:   02/16/2025   All questions were answered. The patient knows to call the clinic with any problems, questions or concerns. No barriers to learning was detected. The total time spent in the appointment was 25 minutes, including review of chart and various tests results, discussions about plan of care and coordination of care plan     Onita Mattock, MD 11/11/2023

## 2023-11-11 NOTE — Patient Instructions (Signed)
 [facility] - A DEPT OF [facility]  Discharge Instructions: Thank you for choosing [facility] to provide your oncology and hematology care.   If you have a lab appointment with the Cancer Center, please go directly to the Cancer Center and check in at the registration area.   Wear comfortable clothing and clothing appropriate for easy access to any Portacath or PICC line.   We strive to give you quality time with your provider. You may need to reschedule your appointment if you arrive late (15 or more minutes).  Arriving late affects you and other patients whose appointments are after yours.  Also, if you miss three or more appointments without notifying the office, you may be dismissed from the clinic at the provider's discretion.      For prescription refill requests, have your pharmacy contact our office and allow 72 hours for refills to be completed.    Today you received the following chemotherapy and/or immunotherapy agents: Opdivo    To help prevent nausea and vomiting after your treatment, we encourage you to take your nausea medication as directed.  BELOW ARE SYMPTOMS THAT SHOULD BE REPORTED IMMEDIATELY: *FEVER GREATER THAN 100.4 F (38 C) OR HIGHER *CHILLS OR SWEATING *NAUSEA AND VOMITING THAT IS NOT CONTROLLED WITH YOUR NAUSEA MEDICATION *UNUSUAL SHORTNESS OF BREATH *UNUSUAL BRUISING OR BLEEDING *URINARY PROBLEMS (pain or burning when urinating, or frequent urination) *BOWEL PROBLEMS (unusual diarrhea, constipation, pain near the anus) TENDERNESS IN MOUTH AND THROAT WITH OR WITHOUT PRESENCE OF ULCERS (sore throat, sores in mouth, or a toothache) UNUSUAL RASH, SWELLING OR PAIN  UNUSUAL VAGINAL DISCHARGE OR ITCHING   Items with * indicate a potential emergency and should be followed up as soon as possible or go to the Emergency Department if any problems should occur.  Please show the CHEMOTHERAPY ALERT CARD or IMMUNOTHERAPY ALERT  CARD at check-in to the Emergency Department and triage nurse.  Should you have questions after your visit or need to cancel or reschedule your appointment, please contact [facility] - A DEPT OF [facility]  Dept: [phone]  and follow the prompts.  Office hours are 8:00 a.m. to 4:30 p.m. Monday - Friday. Please note that voicemails left after 4:00 p.m. may not be returned until the following business day.  We are closed weekends and major holidays. You have access to a nurse at all times for urgent questions. Please call the main number to the clinic Dept: [phone] and follow the prompts.   For any non-urgent questions, you may also contact your provider using MyChart. We now offer e-Visits for anyone 24 and older to request care online for non-urgent symptoms. For details visit [email or web address].   Also download the MyChart app! Go to the app store, search MyChart, open the app, select [facility], and log in with your MyChart username and password.

## 2023-11-12 DIAGNOSIS — C61 Malignant neoplasm of prostate: Secondary | ICD-10-CM | POA: Diagnosis not present

## 2023-11-12 DIAGNOSIS — E0851 Diabetes mellitus due to underlying condition with diabetic peripheral angiopathy without gangrene: Secondary | ICD-10-CM | POA: Diagnosis not present

## 2023-11-12 DIAGNOSIS — I5022 Chronic systolic (congestive) heart failure: Secondary | ICD-10-CM | POA: Diagnosis not present

## 2023-11-12 DIAGNOSIS — I739 Peripheral vascular disease, unspecified: Secondary | ICD-10-CM | POA: Diagnosis not present

## 2023-11-12 DIAGNOSIS — I4891 Unspecified atrial fibrillation: Secondary | ICD-10-CM | POA: Diagnosis not present

## 2023-11-12 DIAGNOSIS — E1169 Type 2 diabetes mellitus with other specified complication: Secondary | ICD-10-CM | POA: Diagnosis not present

## 2023-11-12 DIAGNOSIS — I428 Other cardiomyopathies: Secondary | ICD-10-CM | POA: Diagnosis not present

## 2023-11-15 DIAGNOSIS — E1165 Type 2 diabetes mellitus with hyperglycemia: Secondary | ICD-10-CM | POA: Diagnosis not present

## 2023-11-15 DIAGNOSIS — E1122 Type 2 diabetes mellitus with diabetic chronic kidney disease: Secondary | ICD-10-CM | POA: Diagnosis not present

## 2023-11-15 DIAGNOSIS — E1142 Type 2 diabetes mellitus with diabetic polyneuropathy: Secondary | ICD-10-CM | POA: Diagnosis not present

## 2023-11-15 DIAGNOSIS — I5042 Chronic combined systolic (congestive) and diastolic (congestive) heart failure: Secondary | ICD-10-CM | POA: Diagnosis not present

## 2023-11-15 DIAGNOSIS — N179 Acute kidney failure, unspecified: Secondary | ICD-10-CM | POA: Diagnosis not present

## 2023-11-15 DIAGNOSIS — N1831 Chronic kidney disease, stage 3a: Secondary | ICD-10-CM | POA: Diagnosis not present

## 2023-11-15 DIAGNOSIS — I4892 Unspecified atrial flutter: Secondary | ICD-10-CM | POA: Diagnosis not present

## 2023-11-15 DIAGNOSIS — I482 Chronic atrial fibrillation, unspecified: Secondary | ICD-10-CM | POA: Diagnosis not present

## 2023-11-15 DIAGNOSIS — I130 Hypertensive heart and chronic kidney disease with heart failure and stage 1 through stage 4 chronic kidney disease, or unspecified chronic kidney disease: Secondary | ICD-10-CM | POA: Diagnosis not present

## 2023-11-18 ENCOUNTER — Ambulatory Visit

## 2023-11-18 ENCOUNTER — Ambulatory Visit: Admitting: Hematology

## 2023-11-18 ENCOUNTER — Other Ambulatory Visit: Payer: Self-pay

## 2023-11-18 ENCOUNTER — Other Ambulatory Visit (HOSPITAL_COMMUNITY): Payer: Self-pay

## 2023-11-18 ENCOUNTER — Encounter: Payer: Self-pay | Admitting: Hematology

## 2023-11-18 ENCOUNTER — Other Ambulatory Visit

## 2023-11-18 ENCOUNTER — Encounter

## 2023-11-19 DIAGNOSIS — L309 Dermatitis, unspecified: Secondary | ICD-10-CM | POA: Diagnosis not present

## 2023-11-19 DIAGNOSIS — I4892 Unspecified atrial flutter: Secondary | ICD-10-CM | POA: Diagnosis not present

## 2023-11-19 DIAGNOSIS — E1165 Type 2 diabetes mellitus with hyperglycemia: Secondary | ICD-10-CM | POA: Diagnosis not present

## 2023-11-19 DIAGNOSIS — I5042 Chronic combined systolic (congestive) and diastolic (congestive) heart failure: Secondary | ICD-10-CM | POA: Diagnosis not present

## 2023-11-19 DIAGNOSIS — I130 Hypertensive heart and chronic kidney disease with heart failure and stage 1 through stage 4 chronic kidney disease, or unspecified chronic kidney disease: Secondary | ICD-10-CM | POA: Diagnosis not present

## 2023-11-19 DIAGNOSIS — I482 Chronic atrial fibrillation, unspecified: Secondary | ICD-10-CM | POA: Diagnosis not present

## 2023-11-19 DIAGNOSIS — N1831 Chronic kidney disease, stage 3a: Secondary | ICD-10-CM | POA: Diagnosis not present

## 2023-11-19 DIAGNOSIS — L810 Postinflammatory hyperpigmentation: Secondary | ICD-10-CM | POA: Diagnosis not present

## 2023-11-19 DIAGNOSIS — N179 Acute kidney failure, unspecified: Secondary | ICD-10-CM | POA: Diagnosis not present

## 2023-11-19 DIAGNOSIS — E1142 Type 2 diabetes mellitus with diabetic polyneuropathy: Secondary | ICD-10-CM | POA: Diagnosis not present

## 2023-11-19 DIAGNOSIS — E1122 Type 2 diabetes mellitus with diabetic chronic kidney disease: Secondary | ICD-10-CM | POA: Diagnosis not present

## 2023-11-24 NOTE — Assessment & Plan Note (Signed)
-  diagnosed with Gleason 4+4 prostate cancer in early 2019. He was treated with long-term ADT in combination with 8 weeks of IMRT.

## 2023-11-24 NOTE — Assessment & Plan Note (Signed)
 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  S/p FLOT4 01/02/2021 - 03/22/2021 -Posttreatment EUS 05/2021 and 06/29/2022 by Dr. Rollin showed a scar and residual erythema in stomach, biopsy negative for residual malignancy.  -[name] is clinically doing well on surveillance. Last CT 03/20/22 which shows no evidence of recurrent or metastatic disease, and stable bilateral adrenal adenomas -Surveillance CT scan from October 09, 2022 showed no evidence of recurrence -Surveillance CT scan from April 30, 2023 showed no evidence of residual or recurrent malignancy.  Small liver lesion is stable, favored to be benign. -He unfortunately had local recurrence in 09/2023 -he started Nivo 240mg  q2w and Ipi 1mg /kg q6w on 10/28/2023

## 2023-11-25 ENCOUNTER — Inpatient Hospital Stay: Attending: Hematology

## 2023-11-25 ENCOUNTER — Inpatient Hospital Stay

## 2023-11-25 ENCOUNTER — Inpatient Hospital Stay: Admitting: Hematology

## 2023-11-25 VITALS — BP 100/60 | HR 62 | Temp 98.6°F | Resp 17 | Wt 153.0 lb

## 2023-11-25 DIAGNOSIS — Z5112 Encounter for antineoplastic immunotherapy: Secondary | ICD-10-CM | POA: Insufficient documentation

## 2023-11-25 DIAGNOSIS — C61 Malignant neoplasm of prostate: Secondary | ICD-10-CM | POA: Diagnosis not present

## 2023-11-25 DIAGNOSIS — C163 Malignant neoplasm of pyloric antrum: Secondary | ICD-10-CM

## 2023-11-25 DIAGNOSIS — Z79620 Long term (current) use of immunosuppressive biologic: Secondary | ICD-10-CM | POA: Insufficient documentation

## 2023-11-25 DIAGNOSIS — D509 Iron deficiency anemia, unspecified: Secondary | ICD-10-CM

## 2023-11-25 LAB — CBC WITH DIFFERENTIAL (CANCER CENTER ONLY)
Abs Immature Granulocytes: 0.03 K/uL (ref 0.00–0.07)
Basophils Absolute: 0.1 K/uL (ref 0.0–0.1)
Basophils Relative: 1 %
Eosinophils Absolute: 0.2 K/uL (ref 0.0–0.5)
Eosinophils Relative: 2 %
HCT: 28.4 % — ABNORMAL LOW (ref 39.0–52.0)
Hemoglobin: 8.8 g/dL — ABNORMAL LOW (ref 13.0–17.0)
Immature Granulocytes: 0 %
Lymphocytes Relative: 10 %
Lymphs Abs: 0.8 K/uL (ref 0.7–4.0)
MCH: 27.2 pg (ref 26.0–34.0)
MCHC: 31 g/dL (ref 30.0–36.0)
MCV: 87.9 fL (ref 80.0–100.0)
Monocytes Absolute: 0.8 K/uL (ref 0.1–1.0)
Monocytes Relative: 9 %
Neutro Abs: 7 K/uL (ref 1.7–7.7)
Neutrophils Relative %: 78 %
Platelet Count: 426 K/uL — ABNORMAL HIGH (ref 150–400)
RBC: 3.23 MIL/uL — ABNORMAL LOW (ref 4.22–5.81)
RDW: 15.8 % — ABNORMAL HIGH (ref 11.5–15.5)
WBC Count: 8.9 K/uL (ref 4.0–10.5)
nRBC: 0 % (ref 0.0–0.2)

## 2023-11-25 LAB — CMP (CANCER CENTER ONLY)
ALT: 14 U/L (ref 0–44)
AST: 16 U/L (ref 15–41)
Albumin: 3.6 g/dL (ref 3.5–5.0)
Alkaline Phosphatase: 42 U/L (ref 38–126)
Anion gap: 6 (ref 5–15)
BUN: 27 mg/dL — ABNORMAL HIGH (ref 8–23)
CO2: 26 mmol/L (ref 22–32)
Calcium: 9.1 mg/dL (ref 8.9–10.3)
Chloride: 105 mmol/L (ref 98–111)
Creatinine: 1.02 mg/dL (ref 0.61–1.24)
GFR, Estimated: >60 mL/min (ref >60)
Glucose, Bld: 90 mg/dL (ref 70–99)
Potassium: 4.1 mmol/L (ref 3.5–5.1)
Sodium: 137 mmol/L (ref 135–145)
Total Bilirubin: 0.4 mg/dL (ref 0.0–1.2)
Total Protein: 6.6 g/dL (ref 6.5–8.1)

## 2023-11-25 LAB — FERRITIN: Ferritin: 32 ng/mL (ref 24–336)

## 2023-11-25 MED ORDER — SODIUM CHLORIDE 0.9 % IV SOLN
240.0000 mg | Freq: Once | INTRAVENOUS | Status: AC
  Administered 2023-11-25: 240 mg via INTRAVENOUS
  Filled 2023-11-25: qty 24

## 2023-11-25 MED ORDER — SODIUM CHLORIDE 0.9 % IV SOLN: INTRAVENOUS | Status: DC

## 2023-11-25 NOTE — Progress Notes (Signed)
 [facility]   [phone] [phone]   Clinic Follow up Note   Patient Care Team: [name], MD as PCP - General (Internal Medicine) [name], Aleene PARAS, MD (Inactive) as PCP - Cardiology (Cardiology) Waddell Danelle ORN, MD as PCP - Electrophysiology (Cardiology) Dasie Leonor CROME, MD as Consulting Physician (General Surgery) Lanny Callander, MD as Consulting Physician (Hematology) Rollin Dover, MD as Consulting Physician (Gastroenterology)  Date of Service:  11/25/2023  CHIEF COMPLAINT: f/u of gastric cancer  CURRENT THERAPY:  Nivolumab  and ipilimumab   Oncology History   Malignant neoplasm of prostate [facility]) -diagnosed with Gleason 4+4 prostate cancer in early 2019. He was treated with long-term ADT in combination with 8 weeks of IMRT.   Gastric cancer (HCC) cT3N0M0, stage II -Diagnosed 11/2020, path showed loss of MLH1 and PMS2 which predicts good response to immunotherapy  -Deemed a high risk surgical candidate.  S/p FLOT4 01/02/2021 - 03/22/2021 -Posttreatment EUS 05/2021 and 06/29/2022 by Dr. Rollin showed a scar and residual erythema in stomach, biopsy negative for residual malignancy.  -Mr Arkwright is clinically doing well on surveillance. Last CT 03/20/22 which shows no evidence of recurrent or metastatic disease, and stable bilateral adrenal adenomas -Surveillance CT scan from October 09, 2022 showed no evidence of recurrence -Surveillance CT scan from April 30, 2023 showed no evidence of residual or recurrent malignancy.  Small liver lesion is stable, favored to be benign. -He unfortunately had local recurrence in 09/2023 -he started Nivo 240mg  q2w and Ipi 1mg /kg q6w on 10/28/2023  Assessment & Plan Recurrent gastric cancer of the pyloric antrum Recurrent gastric cancer of the pyloric antrum, currently undergoing treatment with nivolumab . Pain in the stomach area has improved since the first treatment, indicating a potential positive response to  therapy. - Administer nivolumab  today - Plan for CT scan after two cycle of ipi to assess cancer status  Immunotherapy-related skin peeling and dryness Skin peeling and dryness, likely related to immunotherapy, affecting the back, knees, thighs, buttocks, and neck. No associated itching or pain. He has been using moisturizers and has seen a dermatologist who performed a biopsy and recommended sensitive skin products. - Continue using moisturizers and sensitive skin products as recommended by dermatologist  Anemia Anemia with a slight decrease in hemoglobin level compared to the last visit. Currently taking a multivitamin and iron  supplement. - Check iron  levels - Administer intravenous iron  if iron  levels are low  Suspected allergic rhinitis Suspected allergic rhinitis with symptoms of nasal discharge and occasional eye symptoms. - Consider trial of allergy  medication if symptoms persist  Plan - He is clinically stable, epigastric pain has improved. - Reviewed, adequate for treatment, will proceed cycle 1 day 29 nivolumab  today - He will return in 2 weeks for cycle 2-day 1 ipi and Nivo   SUMMARY OF ONCOLOGIC HISTORY: Oncology History Overview Note  Cancer Staging Gastric cancer [facility]) Staging form: Stomach, AJCC 8th Edition - Clinical stage from 12/01/2020: Stage IIB (cT3, cN0, cM0) - Signed by Lanny Callander, MD on 12/20/2020 Stage prefix: Initial diagnosis Total positive nodes: 0  Malignant neoplasm of prostate (HCC) Staging form: Prostate, AJCC 8th Edition - Clinical: Stage IIC (cT2b, cN0, cM0, PSA: 6.7, Grade Group: 4) - Unsigned Prostate specific antigen (PSA) range: Less than 10 Gleason score: 8 Histologic grading system: 5 grade system    Gastric cancer (HCC)  12/01/2020 Procedure   Upper Endoscopy, Dr. Rollin  Impression: - Normal esophagus. 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 Biopsied. - Normal examined  duodenum.   12/01/2020 Pathology Results   FINAL  MICROSCOPIC DIAGNOSIS:   A. INCISURA, BIOPSY:  - Adenocarcinoma, moderate to poorly differentiated arising in a  background of chronic gastritis with intestinal metaplasia.    12/01/2020 Imaging   CT CAP  IMPRESSION: Focal wall thickening along the posterior aspect of the gastric antrum, likely corresponding to the patient's newly diagnosed gastric cancer.   No findings suspicious for metastatic disease.   Mild multifocal pneumonia, lower lobe predominant, likely on the basis of aspiration. Trace right pleural effusion.   Fiducial markers along the prostate in this patient with known prostate cancer.   12/01/2020 Cancer Staging   Staging form: Stomach, AJCC 8th Edition - Clinical stage from 12/01/2020: Stage IIB (cT3, cN0, cM0) - Signed by Lanny Callander, MD on 12/20/2020 Stage prefix: Initial diagnosis Total positive nodes: 0   12/08/2020 Initial Diagnosis   Gastric cancer (HCC)   12/16/2020 Procedure   EUS  - Wall thickening was seen in the lesser curve of the stomach and in the antrum of the stomach. The thickening appeared to be primarily within the serosa (Layer 5). T3 N0 Mx. - There was no sign of significant pathology in the entire pancreas. - There was no sign of significant pathology in the common bile duct and in the gallbladder. - There was no evidence of significant pathology in the left lobe of the liver. - Endosonographic images of the left adrenal gland were unremarkable. - The celiac trunk and superior mesenteric artery were endosonographically normal. - The mediastinum was unremarkable endosonographically. - No specimens collected.   01/02/2021 - 03/23/2021 Chemotherapy   Patient is on Treatment Plan : GASTROESOPHAGEAL FLOT q14d X 4 cycles     04/01/2021 Imaging   EXAM: CT ABDOMEN AND PELVIS WITHOUT CONTRAST  IMPRESSION: 1. No acute findings in the abdomen or pelvis. Specifically, no evidence for metastatic disease in the abdomen or pelvis. 2. Stable 16 mm  left adrenal adenoma. 3. Small fat containing hernias in the right groin and umbilical region. 4. Bilateral pars interarticularis defects at L4 with 10 mm anterolisthesis of L4 on 5, stable. 5. Aortic Atherosclerosis (ICD10-I70.0).   04/13/2021 Imaging   EXAM: CT CHEST WITHOUT CONTRAST  IMPRESSION: 1. No evidence of metastatic disease in the chest. 2. Enlargement of the main pulmonary artery, as can be seen in pulmonary hypertension. 3. Coronary artery disease.   Aortic Atherosclerosis (ICD10-I70.0).   06/09/2021 Procedure   Upper GI Endoscopy, Dr. Rollin  Findings: -The esophagus was normal. -A medium healed ulcer was found on the lesser curvature of the stomach. Biopsies were taken with a cold forceps for histology. -The examined duodenum was normal. -Compared to the prior EGD there is a marked improvement in his gastric cancer. There was only evidence of erythema and a central scar at the incisura. Cold biopsies of the area were obtained, but there was no gross evidence of malignancy.  Impression: - Normal esophagus. - Scar in the lesser curvature of the stomach. Biopsied. - Normal examined duodenum.   06/09/2021 Pathology Results   FINAL MICROSCOPIC DIAGNOSIS:   A. STOMACH, BIOPSY:  - Gastric mucosa with acute and chronic inflammation.  - No dysplasia or malignancy identified.    06/16/2021 - 06/16/2021 Chemotherapy   Patient is on Treatment Plan : GASTROESOPHAGEAL Pembrolizumab (200) q21d     10/28/2023 -  Chemotherapy   Patient is on Treatment Plan : GASTRIC Nivolumab  (240) D1,15,29 + Ipilimumab  (1) D1 q42d  Discussed the use of AI scribe software for clinical note transcription with the patient, who gave verbal consent to proceed.  History of Present Illness RUBEL HECKARD is a 73 year old male with recurrent gastric cancer who presents for follow-up. He is accompanied by his wife.  He is undergoing treatment for recurrent gastric cancer and reports  improvement in abdominal pain since the last treatment, though occasional pain persists. He has adjusted his diet to include lactose-free milk.  He experiences significant skin peeling on his back, knees, thighs, buttocks, and neck. His wife applies creams, including a moisturizer and topical hydrocortisone. The skin condition is not associated with itching or pain, but the peeling is bothersome due to skin flakes. A biopsy was performed, and he uses Dove Sensitive skin products as part of his skincare regimen.  He experiences occasional nasal and eye symptoms, such as a runny nose and blurred vision that clears with blinking. No breathing difficulties, swelling, or significant changes in energy levels. He is able to care for himself and perform daily activities. He takes a multivitamin daily and occasionally forgets to take his iron  supplement.     All other systems were reviewed with the patient and are negative.  MEDICAL HISTORY:  Past Medical History:  Diagnosis Date   AICD (automatic cardioverter/defibrillator) present 05/25/2016   biv icd   Angio-edema    Bunion, right    Chronic combined systolic and diastolic CHF (congestive heart failure) (HCC)    Echo 1/18: Mild conc LVH, EF 15-20, severe diff HK, inf and inf-septal AK, Gr 3 DD, mild to mod MR, severe LAE, mod reduced RVSF, mod RAE, mild TR, PASP 50   Coronary artery disease involving native coronary artery without angina pectoris 04/17/2016   LHC 1/18: pLCx 30, mLCx 20, mRCA 40, dRCA 20, LVEDP 23, mean RA 8, PA 42/20, PCWP 17   Diabetes mellitus without complication (HCC)    Gastric cancer (HCC)    History of atrial fibrillation    History of atrial flutter    History of cardiomegaly 06/07/2016   Noted on CXR   History of colon polyps 06/28/2017   Noted on colonoscopy   Hypertension    LBBB (left bundle branch block)    Nausea vomiting and diarrhea 04/04/2021   NICM (nonischemic cardiomyopathy) (HCC)    Echo 1/18:  Mild  conc LVH, EF 15-20, severe diff HK, inf and inf-septal AK, Gr 3 DD, mild to mod MR, severe LAE, mod reduced RVSF, mod RAE, mild TR, PASP 50   Other secondary pulmonary hypertension (HCC) 04/17/2016   Prostate cancer (HCC) 2019   Sigmoid diverticulosis 06/28/2017   Noted on colonoscopy    SURGICAL HISTORY: Past Surgical History:  Procedure Laterality Date   BIOPSY  12/01/2020   Procedure: BIOPSY;  Surgeon: Rollin Dover, MD;  Location: [facility] ENDOSCOPY;  Service: Endoscopy;;   BIOPSY  06/09/2021   Procedure: BIOPSY;  Surgeon: Rollin Dover, MD;  Location: [facility]OSCOPY;  Service: Gastroenterology;;   BIOPSY  06/29/2022   Procedure: BIOPSY;  Surgeon: Rollin Dover, MD;  Location: [facility] ENDOSCOPY;  Service: Gastroenterology;;   BIV ICD INSERTION CRT-D N/A 05/25/2016   Procedure: BiV ICD Insertion CRT-D;  Surgeon: Danelle LELON Birmingham, MD;  Location: [facility] INVASIVE CV LAB;  Service: Cardiovascular;  Laterality: N/A;   CARDIAC CATHETERIZATION N/A 03/02/2016   Procedure: Right/Left Heart Cath and Coronary Angiography;  Surgeon: Lonni Hanson, MD;  Location: [facility] INVASIVE CV LAB;  Service: Cardiovascular;  Laterality: N/A;  CARDIOVERSION N/A 07/17/2016   Procedure: Cardioversion;  Surgeon: Waddell Danelle ORN, MD;  Location: [facility] INVASIVE CV LAB;  Service: Cardiovascular;  Laterality: N/A;   COLONOSCOPY WITH PROPOFOL  N/A 06/28/2017   Procedure: COLONOSCOPY WITH PROPOFOL ;  Surgeon: Rollin Dover, MD;  Location: [facility] ENDOSCOPY;  Service: Endoscopy;  Laterality: N/A;   colonscopy  2009   ESOPHAGOGASTRODUODENOSCOPY N/A 12/01/2020   Procedure: ESOPHAGOGASTRODUODENOSCOPY (EGD);  Surgeon: Rollin Dover, MD;  Location: [facility] ENDOSCOPY;  Service: Endoscopy;  Laterality: N/A;  IDA/guaiac positive stools   ESOPHAGOGASTRODUODENOSCOPY N/A 10/04/2023   Procedure: EGD (ESOPHAGOGASTRODUODENOSCOPY);  Surgeon: Rollin Dover, MD;  Location: [facility]OSCOPY;  Service: Gastroenterology;  Laterality: N/A;   ESOPHAGOGASTRODUODENOSCOPY (EGD) WITH PROPOFOL   N/A 12/16/2020   Procedure: ESOPHAGOGASTRODUODENOSCOPY (EGD) WITH PROPOFOL ;  Surgeon: Rollin Dover, MD;  Location: [facility] ENDOSCOPY;  Service: Endoscopy;  Laterality: N/A;   ESOPHAGOGASTRODUODENOSCOPY (EGD) WITH PROPOFOL  N/A 06/09/2021   Procedure: ESOPHAGOGASTRODUODENOSCOPY (EGD) WITH PROPOFOL ;  Surgeon: Rollin Dover, MD;  Location: [facility] ENDOSCOPY;  Service: Gastroenterology;  Laterality: N/A;   ESOPHAGOGASTRODUODENOSCOPY (EGD) WITH PROPOFOL  N/A 06/29/2022   Procedure: ESOPHAGOGASTRODUODENOSCOPY (EGD) WITH PROPOFOL ;  Surgeon: Rollin Dover, MD;  Location: [facility] ENDOSCOPY;  Service: Gastroenterology;  Laterality: N/A;   GOLD SEED IMPLANT N/A 12/24/2017   Procedure: GOLD SEED IMPLANT, TRANSERINEAL;  Surgeon: Nieves Cough, MD;  Location: [facility] ORS;  Service: Urology;  Laterality: N/A;   INSERT / REPLACE / REMOVE PACEMAKER     IR ANGIOGRAM SELECTIVE EACH ADDITIONAL VESSEL  11/04/2023   IR ANGIOGRAM SELECTIVE EACH ADDITIONAL VESSEL  11/04/2023   IR CV LINE INJECTION  10/25/2023   IR REMOVAL TUN ACCESS W/ PORT W/O FL MOD SED  11/04/2023   IR US  GUIDE VASC ACCESS RIGHT  11/04/2023   IR VENOCAVAGRAM SVC  11/04/2023   LEAD REVISION  10/10/2018   LEAD REVISION/REPAIR N/A 10/10/2018   Procedure: LEAD REVISION/REPAIR;  Surgeon: Waddell Danelle ORN, MD;  Location: [facility] INVASIVE CV LAB;  Service: Cardiovascular;  Laterality: N/A;   POLYPECTOMY  06/28/2017   Procedure: POLYPECTOMY;  Surgeon: Rollin Dover, MD;  Location: [facility] ENDOSCOPY;  Service: Endoscopy;;  ascending and descending colon polyp   PORTACATH PLACEMENT Right 12/23/2020   Procedure: INSERTION PORT-A-CATH;  Surgeon: Dasie Leonor CROME, MD;  Location: [facility] OR;  Service: General;  Laterality: Right;   PROSTATE BIOPSY  02/20/2017   RIGHT HEART CATH N/A 07/25/2023   Procedure: RIGHT HEART CATH;  Surgeon: Rolan Ezra RAMAN, MD;  Location: [facility] INVASIVE CV LAB;  Service: Cardiovascular;  Laterality: N/A;   RIGHT/LEFT HEART CATH AND CORONARY ANGIOGRAPHY N/A 10/30/2022   Procedure:  RIGHT/LEFT HEART CATH AND CORONARY ANGIOGRAPHY;  Surgeon: Rolan Ezra RAMAN, MD;  Location: [facility] INVASIVE CV LAB;  Service: Cardiovascular;  Laterality: N/A;   SPACE OAR INSTILLATION N/A 12/24/2017   Procedure: SPACE OAR INSTILLATION;  Surgeon: Nieves Cough, MD;  Location: [facility] ORS;  Service: Urology;  Laterality: N/A;   TOTAL KNEE ARTHROPLASTY Right 08/16/2017   Procedure: RIGHT TOTAL KNEE ARTHROPLASTY;  Surgeon: Shari Sieving, MD;  Location: [facility] OR;  Service: Orthopedics;  Laterality: Right;   UPPER ESOPHAGEAL ENDOSCOPIC ULTRASOUND (EUS) N/A 12/16/2020   Procedure: UPPER ESOPHAGEAL ENDOSCOPIC ULTRASOUND (EUS);  Surgeon: Rollin Dover, MD;  Location: [facility]OSCOPY;  Service: Endoscopy;  Laterality: N/A;    I have reviewed the social history and family history with the patient and they are unchanged from previous note.  ALLERGIES:  is allergic to aspirin , sacubitril -valsartan , sulfa antibiotics, and ace inhibitors.  MEDICATIONS:  Current Outpatient Medications  Medication Sig Dispense Refill  Accu-Chek Softclix Lancets lancets Use to check your blood sugar finger stick once a day Dx Code E11.9 90 days 100 each 3   amiodarone  (PACERONE ) 200 MG tablet Take 1 tablet (200 mg total) by mouth 2 (two) times daily for 14 days, THEN 1 tablet (200 mg total) daily. 180 tablet 3   atorvastatin  (LIPITOR) 40 MG tablet Take 1 tablet (40 mg total) by mouth daily. 90 tablet 3   Biotin 1000 MCG tablet Take 1,000 mcg by mouth daily with breakfast.     Blood Glucose Monitoring Suppl (ACCU-CHEK GUIDE) w/Device KIT as directed to check blood sugar once a day 1 kit 0   cetirizine  (ZYRTEC ) 10 MG tablet Take 1 tablet (10 mg total) by mouth daily. 30 tablet 0   cholecalciferol  (VITAMIN D3) 25 MCG (1000 UT) tablet Take 1,000 Units by mouth daily with breakfast.     digoxin  (LANOXIN ) 0.125 MG tablet Take 1/2 tablet (0.0625mg  total) by mouth daily. 15 tablet 6   empagliflozin  (JARDIANCE ) 10 MG TABS tablet Take 1 tablet (10  mg total) by mouth daily. 90 tablet 3   EPINEPHrine  0.3 mg/0.3 mL IJ SOAJ injection Inject 0.3 mg into the muscle as needed for anaphylaxis. 2 each 1   ferrous sulfate 325 (65 FE) MG tablet Take 325 mg by mouth daily with breakfast.     furosemide  (LASIX ) 40 MG tablet Take 1 tablet (40 mg total) by mouth every other day. Alternating with 20 mg. 30 tablet 6   gabapentin  (NEURONTIN ) 100 MG capsule Take 1 capsule (100 mg total) by mouth 2 (two) times daily. 180 capsule 1   glucose blood (ACCU-CHEK GUIDE TEST) test strip Use to check blood sugars once daily. 100 each 3   hydrOXYzine  (ATARAX ) 25 MG tablet Take 1/2 tablet (12.5 mg total) by mouth every 8 (eight) hours as needed for up to 6 doses for itching. 3 tablet 0   ipratropium (ATROVENT ) 0.03 % nasal spray Place 1-2 sprays into both nostrils 2 (two) times daily as needed (nasal drainage). 30 mL 2   lidocaine -prilocaine  (EMLA ) cream Apply 1 Application topically as needed. 30 g 0   lidocaine -prilocaine  (EMLA ) cream Apply to affected area once 30 g 3   metFORMIN  (GLUCOPHAGE -XR) 500 MG 24 hr tablet Take 1 tablet (500 mg total) by mouth daily with evening meal. 90 tablet 3   metoprolol  succinate (TOPROL -XL) 25 MG 24 hr tablet Take 1 tablet (25 mg total) by mouth daily. 90 tablet 3   multivitamin (ONE-A-DAY MEN'S) TABS tablet Take 1 tablet by mouth daily with breakfast.     ondansetron  (ZOFRAN ) 8 MG tablet Take 1 tablet (8 mg total) by mouth every 8 (eight) hours as needed for nausea or vomiting. 30 tablet 1   pantoprazole  (PROTONIX ) 40 MG tablet Take 1 tablet (40 mg total) by mouth daily. 30 tablet 1   pantoprazole  (PROTONIX ) 40 MG tablet Take 1 tablet (40 mg total) by mouth 2 (two) times daily. 180 tablet 3   potassium chloride  (KLOR-CON ) 10 MEQ tablet Take 2 tablets (20 mEq total) by mouth daily. (Patient taking differently: Take 10 mEq by mouth 2 (two) times daily.) 60 tablet 3   prochlorperazine  (COMPAZINE ) 10 MG tablet Take 1 tablet (10 mg total)  by mouth every 6 (six) hours as needed for nausea or vomiting. 30 tablet 1   rivaroxaban  (XARELTO ) 20 MG TABS tablet Take 1 tablet (20 mg total) by mouth daily with supper. 90 tablet 3   spironolactone  (ALDACTONE ) 25  MG tablet Take 0.5 tablets (12.5 mg total) by mouth daily.     sucralfate  (CARAFATE ) 1 GM/10ML suspension Take 10 mLs (1 g total) by mouth 4 (four) times daily -  with meals and at bedtime. 420 mL 1   traMADol  (ULTRAM ) 50 MG tablet Take 1 tablet (50 mg total) by mouth every 6 (six) hours as needed. 60 tablet 0   valsartan  (DIOVAN ) 40 MG tablet Take 1 tablet (40 mg total) by mouth 2 (two) times daily. 60 tablet 11   valsartan  (DIOVAN ) 40 MG tablet Take 0.5 tablets (20 mg total) by mouth 2 (two) times daily.     No current facility-administered medications for this visit.   Facility-Administered Medications Ordered in Other Visits  Medication Dose Route Frequency Provider Last Rate Last Admin   0.9 %  sodium chloride  infusion   Intravenous Continuous Lanny Callander, MD   Stopped at 11/25/23 1604    PHYSICAL EXAMINATION: ECOG PERFORMANCE STATUS: 2 - Symptomatic, <50% confined to bed  Vitals:   11/25/23 1203  BP: 100/60  Pulse: 62  Resp: 17  Temp: 98.6 F (37 C)  SpO2: 96%   Wt Readings from Last 3 Encounters:  11/25/23 153 lb (69.4 kg)  11/04/23 155 lb (70.3 kg)  10/28/23 151 lb 6.4 oz (68.7 kg)     GENERAL:alert, no distress and comfortable SKIN: skin color, texture, turgor are normal, no rashes or significant lesions EYES: normal, Conjunctiva are pink and non-injected, sclera clear NECK: supple, thyroid  normal size, non-tender, without nodularity LYMPH:  no palpable lymphadenopathy in the cervical, axillary  LUNGS: clear to auscultation and percussion with normal breathing effort HEART: regular rate & rhythm and no murmurs and no lower extremity edema ABDOMEN:abdomen soft, non-tender and normal bowel sounds Musculoskeletal:no cyanosis of digits and no clubbing  NEURO:  alert & oriented x 3 with fluent speech, no focal motor/sensory deficits  Physical Exam CHEST: Lungs clear to auscultation bilaterally.  LABORATORY DATA:  I have reviewed the data as listed    Latest Ref Rng & Units 11/25/2023   11:02 AM 11/11/2023    7:32 AM 11/05/2023   10:17 AM  CBC  WBC 4.0 - 10.5 K/uL 8.9  10.2    Hemoglobin 13.0 - 17.0 g/dL 8.8  9.0  9.9   Hematocrit 39.0 - 52.0 % 28.4  28.5  29.0   Platelets 150 - 400 K/uL 426  356          Latest Ref Rng & Units 11/25/2023   11:02 AM 11/11/2023    7:32 AM 11/05/2023   10:17 AM  CMP  Glucose 70 - 99 mg/dL 90  897  895   BUN 8 - 23 mg/dL 27  25  20    Creatinine 0.61 - 1.24 mg/dL 8.97  8.79  8.89   Sodium 135 - 145 mmol/L 137  132  135   Potassium 3.5 - 5.1 mmol/L 4.1  4.3  4.3   Chloride 98 - 111 mmol/L 105  98  103   CO2 22 - 32 mmol/L 26  27    Calcium  8.9 - 10.3 mg/dL 9.1  8.7    Total Protein 6.5 - 8.1 g/dL 6.6  6.4    Total Bilirubin 0.0 - 1.2 mg/dL 0.4  0.4    Alkaline Phos 38 - 126 U/L 42  48    AST 15 - 41 U/L 16  13    ALT 0 - 44 U/L 14  12  RADIOGRAPHIC STUDIES: I have personally reviewed the radiological images as listed and agreed with the findings in the report. No results found.    No orders of the defined types were placed in this encounter.  All questions were answered. The patient knows to call the clinic with any problems, questions or concerns. No barriers to learning was detected. The total time spent in the appointment was 25 minutes, including review of chart and various tests results, discussions about plan of care and coordination of care plan     Onita Mattock, MD 11/25/2023

## 2023-11-25 NOTE — Patient Instructions (Signed)
 [facility] - A DEPT OF [facility]   Discharge Instructions: Thank you for choosing [facility] to provide your oncology and hematology care.   If you have a lab appointment with the Cancer Center, please go directly to the Cancer Center and check in at the registration area.   Wear comfortable clothing and clothing appropriate for easy access to any Portacath or PICC line.   We strive to give you quality time with your provider. You may need to reschedule your appointment if you arrive late (15 or more minutes).  Arriving late affects you and other patients whose appointments are after yours.  Also, if you miss three or more appointments without notifying the office, you may be dismissed from the clinic at the provider's discretion.      For prescription refill requests, have your pharmacy contact our office and allow 72 hours for refills to be completed.    Today you received the following chemotherapy and/or immunotherapy agents: Nivolumab (Opdivo)      To help prevent nausea and vomiting after your treatment, we encourage you to take your nausea medication as directed.  BELOW ARE SYMPTOMS THAT SHOULD BE REPORTED IMMEDIATELY: *FEVER GREATER THAN 100.4 F (38 C) OR HIGHER *CHILLS OR SWEATING *NAUSEA AND VOMITING THAT IS NOT CONTROLLED WITH YOUR NAUSEA MEDICATION *UNUSUAL SHORTNESS OF BREATH *UNUSUAL BRUISING OR BLEEDING *URINARY PROBLEMS (pain or burning when urinating, or frequent urination) *BOWEL PROBLEMS (unusual diarrhea, constipation, pain near the anus) TENDERNESS IN MOUTH AND THROAT WITH OR WITHOUT PRESENCE OF ULCERS (sore throat, sores in mouth, or a toothache) UNUSUAL RASH, SWELLING OR PAIN  UNUSUAL VAGINAL DISCHARGE OR ITCHING   Items with * indicate a potential emergency and should be followed up as soon as possible or go to the Emergency Department if any problems should occur.  Please show the CHEMOTHERAPY ALERT CARD or  IMMUNOTHERAPY ALERT CARD at check-in to the Emergency Department and triage nurse.  Should you have questions after your visit or need to cancel or reschedule your appointment, please contact [facility] - A DE[facility]  Dept: [phone]  and follow the prompts.  Office hours are 8:00 a.m. to 4:30 p.m. Monday - Friday. Please note that voicemails left after 4:00 p.m. may not be returned until the following business day.  We are closed weekends and major holidays. You have access to a nurse at all times for urgent questions. Please call the main number to the clinic Dept: [phone] and follow the prompts.   For any non-urgent questions, you may also contact your provider using MyChart. We now offer e-Visits for anyone 4 and older to request care online for non-urgent symptoms. For details visit [email or web address].   Also download the MyChart app! Go to the app store, search "MyChart", open the app, select [place], and log in with your MyChart username and password.

## 2023-11-25 NOTE — Progress Notes (Signed)
 Per Dr Lanny, ok to use PICC with tip in right subclavian vein due to severe stenosis in SVC. Charge nurse Ronal Schatz aware.

## 2023-11-26 ENCOUNTER — Encounter: Payer: Self-pay | Admitting: Hematology

## 2023-11-26 ENCOUNTER — Other Ambulatory Visit (HOSPITAL_COMMUNITY): Payer: Self-pay

## 2023-11-26 NOTE — Progress Notes (Signed)
 This encounter was created in error - please disregard.

## 2023-12-04 ENCOUNTER — Other Ambulatory Visit (HOSPITAL_COMMUNITY): Payer: Self-pay | Admitting: Family Medicine

## 2023-12-05 ENCOUNTER — Other Ambulatory Visit (HOSPITAL_COMMUNITY): Payer: Self-pay

## 2023-12-05 DIAGNOSIS — I4891 Unspecified atrial fibrillation: Secondary | ICD-10-CM | POA: Diagnosis not present

## 2023-12-05 DIAGNOSIS — I5022 Chronic systolic (congestive) heart failure: Secondary | ICD-10-CM | POA: Diagnosis not present

## 2023-12-05 DIAGNOSIS — E1169 Type 2 diabetes mellitus with other specified complication: Secondary | ICD-10-CM | POA: Diagnosis not present

## 2023-12-05 DIAGNOSIS — I739 Peripheral vascular disease, unspecified: Secondary | ICD-10-CM | POA: Diagnosis not present

## 2023-12-05 MED ORDER — POTASSIUM CHLORIDE ER 10 MEQ PO TBCR
20.0000 meq | EXTENDED_RELEASE_TABLET | Freq: Every day | ORAL | 3 refills | Status: AC
  Filled 2023-12-05: qty 60, 30d supply, fill #0
  Filled 2023-12-19 – 2024-01-08 (×2): qty 60, 30d supply, fill #1
  Filled 2024-02-07: qty 60, 30d supply, fill #2
  Filled 2024-03-09: qty 60, 30d supply, fill #3

## 2023-12-06 ENCOUNTER — Other Ambulatory Visit (HOSPITAL_COMMUNITY): Payer: Self-pay

## 2023-12-09 ENCOUNTER — Inpatient Hospital Stay

## 2023-12-09 ENCOUNTER — Inpatient Hospital Stay: Admitting: Hematology

## 2023-12-09 VITALS — BP 110/68 | HR 64 | Temp 98.0°F | Resp 18 | Ht 66.0 in | Wt 157.8 lb

## 2023-12-09 DIAGNOSIS — Z5112 Encounter for antineoplastic immunotherapy: Secondary | ICD-10-CM | POA: Diagnosis not present

## 2023-12-09 DIAGNOSIS — C61 Malignant neoplasm of prostate: Secondary | ICD-10-CM | POA: Diagnosis not present

## 2023-12-09 DIAGNOSIS — D509 Iron deficiency anemia, unspecified: Secondary | ICD-10-CM

## 2023-12-09 DIAGNOSIS — C163 Malignant neoplasm of pyloric antrum: Secondary | ICD-10-CM

## 2023-12-09 DIAGNOSIS — Z79620 Long term (current) use of immunosuppressive biologic: Secondary | ICD-10-CM | POA: Diagnosis not present

## 2023-12-09 LAB — CMP (CANCER CENTER ONLY)
ALT: 19 U/L (ref 0–44)
AST: 18 U/L (ref 15–41)
Albumin: 4.1 g/dL (ref 3.5–5.0)
Alkaline Phosphatase: 53 U/L (ref 38–126)
Anion gap: 5 (ref 5–15)
BUN: 31 mg/dL — ABNORMAL HIGH (ref 8–23)
CO2: 26 mmol/L (ref 22–32)
Calcium: 9.1 mg/dL (ref 8.9–10.3)
Chloride: 102 mmol/L (ref 98–111)
Creatinine: 1.13 mg/dL (ref 0.61–1.24)
GFR, Estimated: >60 mL/min (ref >60)
Glucose, Bld: 118 mg/dL — ABNORMAL HIGH (ref 70–99)
Potassium: 4.3 mmol/L (ref 3.5–5.1)
Sodium: 133 mmol/L — ABNORMAL LOW (ref 135–145)
Total Bilirubin: 0.4 mg/dL (ref 0.0–1.2)
Total Protein: 7.6 g/dL (ref 6.5–8.1)

## 2023-12-09 LAB — CBC WITH DIFFERENTIAL (CANCER CENTER ONLY)
Abs Immature Granulocytes: 0.02 K/uL (ref 0.00–0.07)
Basophils Absolute: 0 K/uL (ref 0.0–0.1)
Basophils Relative: 0 %
Eosinophils Absolute: 0.1 K/uL (ref 0.0–0.5)
Eosinophils Relative: 1 %
HCT: 33.9 % — ABNORMAL LOW (ref 39.0–52.0)
Hemoglobin: 10.5 g/dL — ABNORMAL LOW (ref 13.0–17.0)
Immature Granulocytes: 0 %
Lymphocytes Relative: 11 %
Lymphs Abs: 0.8 K/uL (ref 0.7–4.0)
MCH: 27.6 pg (ref 26.0–34.0)
MCHC: 31 g/dL (ref 30.0–36.0)
MCV: 89.2 fL (ref 80.0–100.0)
Monocytes Absolute: 0.9 K/uL (ref 0.1–1.0)
Monocytes Relative: 12 %
Neutro Abs: 5.2 K/uL (ref 1.7–7.7)
Neutrophils Relative %: 76 %
Platelet Count: 339 K/uL (ref 150–400)
RBC: 3.8 MIL/uL — ABNORMAL LOW (ref 4.22–5.81)
RDW: 17.2 % — ABNORMAL HIGH (ref 11.5–15.5)
WBC Count: 6.9 K/uL (ref 4.0–10.5)
nRBC: 0 % (ref 0.0–0.2)

## 2023-12-09 LAB — TSH: TSH: 2.08 u[IU]/mL (ref 0.350–4.500)

## 2023-12-09 MED ORDER — SODIUM CHLORIDE 0.9 % IV SOLN: INTRAVENOUS | Status: DC

## 2023-12-09 MED ORDER — FAMOTIDINE IN NACL 20-0.9 MG/50ML-% IV SOLN
20.0000 mg | Freq: Once | INTRAVENOUS | Status: AC
  Administered 2023-12-09: 20 mg via INTRAVENOUS
  Filled 2023-12-09: qty 50

## 2023-12-09 MED ORDER — DIPHENHYDRAMINE HCL 50 MG/ML IJ SOLN
25.0000 mg | Freq: Once | INTRAMUSCULAR | Status: AC
  Administered 2023-12-09: 25 mg via INTRAVENOUS
  Filled 2023-12-09: qty 1

## 2023-12-09 MED ORDER — SODIUM CHLORIDE 0.9% FLUSH: 10.0000 mL | INTRAVENOUS | Status: DC | PRN

## 2023-12-09 MED ORDER — SODIUM CHLORIDE 0.9 % IV SOLN
240.0000 mg | Freq: Once | INTRAVENOUS | Status: AC
  Administered 2023-12-09: 240 mg via INTRAVENOUS
  Filled 2023-12-09: qty 24

## 2023-12-09 MED ORDER — SODIUM CHLORIDE 0.9 % IV SOLN
1.0000 mg/kg | Freq: Once | INTRAVENOUS | Status: AC
  Administered 2023-12-09: 70 mg via INTRAVENOUS
  Filled 2023-12-09: qty 14

## 2023-12-09 NOTE — Progress Notes (Signed)
 [facility]   [phone] Fax:([phone]) [phone]   Clinic Follow up Note   Patient Care Team: [name], MD as PCP - General (Internal Medicine) [name], Aleene PARAS, MD (Inactive) as PCP - Cardiology (Cardiology) Waddell Danelle ORN, MD as PCP - Electrophysiology (Cardiology) Dasie Leonor CROME, MD as Consulting Physician (General Surgery) Lanny Callander, MD as Consulting Physician (Hematology) Rollin Dover, MD as Consulting Physician (Gastroenterology)  Date of Service:  12/09/2023  CHIEF COMPLAINT: f/u of recurrent gastric cancer  CURRENT THERAPY:  Nivolumab  and ipilimumab   Oncology History   Malignant neoplasm of prostate [facility]) -diagnosed with Gleason 4+4 prostate cancer in early 2019. He was treated with long-term ADT in combination with 8 weeks of IMRT.   Gastric cancer (HCC) cT3N0M0, stage II -Diagnosed 11/2020, path showed loss of MLH1 and PMS2 which predicts good response to immunotherapy  -Deemed a high risk surgical candidate.  S/p FLOT4 01/02/2021 - 03/22/2021 -Posttreatment EUS 05/2021 and 06/29/2022 by Dr. Rollin showed a scar and residual erythema in stomach, biopsy negative for residual malignancy.  -Jon Williams is clinically doing well on surveillance. Last CT 03/20/22 which shows no evidence of recurrent or metastatic disease, and stable bilateral adrenal adenomas -Surveillance CT scan from October 09, 2022 showed no evidence of recurrence -Surveillance CT scan from April 30, 2023 showed no evidence of residual or recurrent malignancy.  Small liver lesion is stable, favored to be benign. -He unfortunately had local recurrence in 09/2023 -he started Nivo 240mg  q2w and Ipi 1mg /kg q6w on 10/28/2023  Assessment & Plan Malignant neoplasm of pyloric antrum Undergoing treatment with improved symptoms, including no stomach pain and increased appetite, resulting in a six-pound weight gain over the past month. Hemoglobin increased from 8.8 to 10.5, indicating a  positive response to treatment and possibly reduced tumor bleeding. Last CT scan on September 23, with a follow-up scan planned for early to mid-December to assess treatment response. - Administer chemotherapy every two weeks, with the next treatments on November 10 and November 24. - Schedule a follow-up CT scan for early to mid-December. - Ensure he sees a dietitian in the infusion room today.  PICC line management for chemotherapy administration PICC line in place for chemotherapy administration due to inability to place a port. Expected to remain for several months. - Perform weekly PICC line dressing changes. - Reschedule appointments to ensure correct scheduling for treatments and PICC line maintenance.  Plan - He is clinically doing well, epigastric pain has resolved.  He is tolerating treatment well. - Lab reviewed, adequate for treatment, will proceed to cycle 2-day 1 nivolumab  and ipilimumab  - Will return in 2 weeks for nivolumab    SUMMARY OF ONCOLOGIC HISTORY: Oncology History Overview Note  Cancer Staging Gastric cancer [facility]) Staging form: Stomach, AJCC 8th Edition - Clinical stage from 12/01/2020: Stage IIB (cT3, cN0, cM0) - Signed by Lanny Callander, MD on 12/20/2020 Stage prefix: Initial diagnosis Total positive nodes: 0  Malignant neoplasm of prostate (HCC) Staging form: Prostate, AJCC 8th Edition - Clinical: Stage IIC (cT2b, cN0, cM0, PSA: 6.7, Grade Group: 4) - Unsigned Prostate specific antigen (PSA) range: Less than 10 Gleason score: 8 Histologic grading system: 5 grade system    Gastric cancer (HCC)  12/01/2020 Procedure   Upper Endoscopy, Dr. Rollin  Impression: - Normal esophagus. 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 Biopsied. - Normal examined duodenum.   12/01/2020 Pathology Results   FINAL MICROSCOPIC DIAGNOSIS:   A. INCISURA, BIOPSY:  - Adenocarcinoma, moderate to poorly  differentiated arising in a  background of chronic gastritis with intestinal  metaplasia.    12/01/2020 Imaging   CT CAP  IMPRESSION: Focal wall thickening along the posterior aspect of the gastric antrum, likely corresponding to the patient's newly diagnosed gastric cancer.   No findings suspicious for metastatic disease.   Mild multifocal pneumonia, lower lobe predominant, likely on the basis of aspiration. Trace right pleural effusion.   Fiducial markers along the prostate in this patient with known prostate cancer.   12/01/2020 Cancer Staging   Staging form: Stomach, AJCC 8th Edition - Clinical stage from 12/01/2020: Stage IIB (cT3, cN0, cM0) - Signed by Lanny Callander, MD on 12/20/2020 Stage prefix: Initial diagnosis Total positive nodes: 0   12/08/2020 Initial Diagnosis   Gastric cancer (HCC)   12/16/2020 Procedure   EUS  - Wall thickening was seen in the lesser curve of the stomach and in the antrum of the stomach. The thickening appeared to be primarily within the serosa (Layer 5). T3 N0 Mx. - There was no sign of significant pathology in the entire pancreas. - There was no sign of significant pathology in the common bile duct and in the gallbladder. - There was no evidence of significant pathology in the left lobe of the liver. - Endosonographic images of the left adrenal gland were unremarkable. - The celiac trunk and superior mesenteric artery were endosonographically normal. - The mediastinum was unremarkable endosonographically. - No specimens collected.   01/02/2021 - 03/23/2021 Chemotherapy   Patient is on Treatment Plan : GASTROESOPHAGEAL FLOT q14d X 4 cycles     04/01/2021 Imaging   EXAM: CT ABDOMEN AND PELVIS WITHOUT CONTRAST  IMPRESSION: 1. No acute findings in the abdomen or pelvis. Specifically, no evidence for metastatic disease in the abdomen or pelvis. 2. Stable 16 mm left adrenal adenoma. 3. Small fat containing hernias in the right groin and umbilical region. 4. Bilateral pars interarticularis defects at L4 with 10  mm anterolisthesis of L4 on 5, stable. 5. Aortic Atherosclerosis (ICD10-I70.0).   04/13/2021 Imaging   EXAM: CT CHEST WITHOUT CONTRAST  IMPRESSION: 1. No evidence of metastatic disease in the chest. 2. Enlargement of the main pulmonary artery, as can be seen in pulmonary hypertension. 3. Coronary artery disease.   Aortic Atherosclerosis (ICD10-I70.0).   06/09/2021 Procedure   Upper GI Endoscopy, Dr. Rollin  Findings: -The esophagus was normal. -A medium healed ulcer was found on the lesser curvature of the stomach. Biopsies were taken with a cold forceps for histology. -The examined duodenum was normal. -Compared to the prior EGD there is a marked improvement in his gastric cancer. There was only evidence of erythema and a central scar at the incisura. Cold biopsies of the area were obtained, but there was no gross evidence of malignancy.  Impression: - Normal esophagus. - Scar in the lesser curvature of the stomach. Biopsied. - Normal examined duodenum.   06/09/2021 Pathology Results   FINAL MICROSCOPIC DIAGNOSIS:   A. STOMACH, BIOPSY:  - Gastric mucosa with acute and chronic inflammation.  - No dysplasia or malignancy identified.    06/16/2021 - 06/16/2021 Chemotherapy   Patient is on Treatment Plan : GASTROESOPHAGEAL Pembrolizumab (200) q21d     10/28/2023 -  Chemotherapy   Patient is on Treatment Plan : GASTRIC Nivolumab  (240) D1,15,29 + Ipilimumab  (1) D1 q42d        Discussed the use of AI scribe software for clinical note transcription with the patient, who gave verbal consent to proceed.  History  of Present Illness Jon Williams is a 73 year old male who presents for follow-up of recurrent gastroenteritis.  He feels good overall with no current stomach pain, which was present during the last two treatment cycles. His appetite has improved, and he is eating more, focusing on increasing protein intake. He has gained six pounds over the past month, currently weighing  157 pounds, up from 151 pounds.  He experiences a sensation of tightness and discoloration on his lip, describing it as tight and slightly discolored, with increased redness.     All other systems were reviewed with the patient and are negative.  MEDICAL HISTORY:  Past Medical History:  Diagnosis Date   AICD (automatic cardioverter/defibrillator) present 05/25/2016   biv icd   Angio-edema    Bunion, right    Chronic combined systolic and diastolic CHF (congestive heart failure) (HCC)    Echo 1/18: Mild conc LVH, EF 15-20, severe diff HK, inf and inf-septal AK, Gr 3 DD, mild to mod Jon, severe LAE, mod reduced RVSF, mod RAE, mild TR, PASP 50   Coronary artery disease involving native coronary artery without angina pectoris 04/17/2016   LHC 1/18: pLCx 30, mLCx 20, mRCA 40, dRCA 20, LVEDP 23, mean RA 8, PA 42/20, PCWP 17   Diabetes mellitus without complication (HCC)    Gastric cancer (HCC)    History of atrial fibrillation    History of atrial flutter    History of cardiomegaly 06/07/2016   Noted on CXR   History of colon polyps 06/28/2017   Noted on colonoscopy   Hypertension    LBBB (left bundle branch block)    Nausea vomiting and diarrhea 04/04/2021   NICM (nonischemic cardiomyopathy) (HCC)    Echo 1/18:  Mild conc LVH, EF 15-20, severe diff HK, inf and inf-septal AK, Gr 3 DD, mild to mod Jon, severe LAE, mod reduced RVSF, mod RAE, mild TR, PASP 50   Other secondary pulmonary hypertension (HCC) 04/17/2016   Prostate cancer (HCC) 2019   Sigmoid diverticulosis 06/28/2017   Noted on colonoscopy    SURGICAL HISTORY: Past Surgical History:  Procedure Laterality Date   BIOPSY  12/01/2020   Procedure: BIOPSY;  Surgeon: Rollin Dover, MD;  Location: [facility] ENDOSCOPY;  Service: Endoscopy;;   BIOPSY  06/09/2021   Procedure: BIOPSY;  Surgeon: Rollin Dover, MD;  Location: [facility]COPY;  Service: Gastroenterology;;   BIOPSY  06/29/2022   Procedure: BIOPSY;  Surgeon: Rollin Dover, MD;   Location: [facility] ENDOSCOPY;  Service: Gastroenterology;;   BIV ICD INSERTION CRT-D N/A 05/25/2016   Procedure: BiV ICD Insertion CRT-D;  Surgeon: Danelle LELON Birmingham, MD;  Location: [facility] INVASIVE CV LAB;  Service: Cardiovascular;  Laterality: N/A;   CARDIAC CATHETERIZATION N/A 03/02/2016   Procedure: Right/Left Heart Cath and Coronary Angiography;  Surgeon: Lonni Hanson, MD;  Location: [facility] INVASIVE CV LAB;  Service: Cardiovascular;  Laterality: N/A;   CARDIOVERSION N/A 07/17/2016   Procedure: Cardioversion;  Surgeon: Birmingham Danelle LELON, MD;  Location: [facility] INVASIVE CV LAB;  Service: Cardiovascular;  Laterality: N/A;   COLONOSCOPY WITH PROPOFOL  N/A 06/28/2017   Procedure: COLONOSCOPY WITH PROPOFOL ;  Surgeon: Rollin Dover, MD;  Location: [facility] ENDOSCOPY;  Service: Endoscopy;  Laterality: N/A;   colonscopy  2009   ESOPHAGOGASTRODUODENOSCOPY N/A 12/01/2020   Procedure: ESOPHAGOGASTRODUODENOSCOPY (EGD);  Surgeon: Rollin Dover, MD;  Location: [facility] ENDOSCOPY;  Service: Endoscopy;  Laterality: N/A;  IDA/guaiac positive stools   ESOPHAGOGASTRODUODENOSCOPY N/A 10/04/2023   Procedure: EGD (ESOPHAGOGASTRODUODENOSCOPY);  Surgeon: Rollin Dover, MD;  Location: [facility] ENDOSCOPY;  Service: Gastroenterology;  Laterality: N/A;   ESOPHAGOGASTRODUODENOSCOPY (EGD) WITH PROPOFOL  N/A 12/16/2020   Procedure: ESOPHAGOGASTRODUODENOSCOPY (EGD) WITH PROPOFOL ;  Surgeon: Rollin Dover, MD;  Location: [facility] ENDOSCOPY;  Service: Endoscopy;  Laterality: N/A;   ESOPHAGOGASTRODUODENOSCOPY (EGD) WITH PROPOFOL  N/A 06/09/2021   Procedure: ESOPHAGOGASTRODUODENOSCOPY (EGD) WITH PROPOFOL ;  Surgeon: Rollin Dover, MD;  Location: [facility] ENDOSCOPY;  Service: Gastroenterology;  Laterality: N/A;   ESOPHAGOGASTRODUODENOSCOPY (EGD) WITH PROPOFOL  N/A 06/29/2022   Procedure: ESOPHAGOGASTRODUODENOSCOPY (EGD) WITH PROPOFOL ;  Surgeon: Rollin Dover, MD;  Location: [facility] ENDOSCOPY;  Service: Gastroenterology;  Laterality: N/A;   GOLD SEED IMPLANT N/A 12/24/2017   Procedure: GOLD SEED  IMPLANT, TRANSERINEAL;  Surgeon: Nieves Cough, MD;  Location: [facility] ORS;  Service: Urology;  Laterality: N/A;   INSERT / REPLACE / REMOVE PACEMAKER     IR ANGIOGRAM SELECTIVE EACH ADDITIONAL VESSEL  11/04/2023   IR ANGIOGRAM SELECTIVE EACH ADDITIONAL VESSEL  11/04/2023   IR CV LINE INJECTION  10/25/2023   IR REMOVAL TUN ACCESS W/ PORT W/O FL MOD SED  11/04/2023   IR US  GUIDE VASC ACCESS RIGHT  11/04/2023   IR VENOCAVAGRAM SVC  11/04/2023   LEAD REVISION  10/10/2018   LEAD REVISION/REPAIR N/A 10/10/2018   Procedure: LEAD REVISION/REPAIR;  Surgeon: Waddell Danelle ORN, MD;  Location: [facility] INVASIVE CV LAB;  Service: Cardiovascular;  Laterality: N/A;   POLYPECTOMY  06/28/2017   Procedure: POLYPECTOMY;  Surgeon: Rollin Dover, MD;  Location: [facility] ENDOSCOPY;  Service: Endoscopy;;  ascending and descending colon polyp   PORTACATH PLACEMENT Right 12/23/2020   Procedure: INSERTION PORT-A-CATH;  Surgeon: Dasie Leonor CROME, MD;  Location: [facility] OR;  Service: General;  Laterality: Right;   PROSTATE BIOPSY  02/20/2017   RIGHT HEART CATH N/A 07/25/2023   Procedure: RIGHT HEART CATH;  Surgeon: Rolan Ezra RAMAN, MD;  Location: [facility] INVASIVE CV LAB;  Service: Cardiovascular;  Laterality: N/A;   RIGHT/LEFT HEART CATH AND CORONARY ANGIOGRAPHY N/A 10/30/2022   Procedure: RIGHT/LEFT HEART CATH AND CORONARY ANGIOGRAPHY;  Surgeon: Rolan Ezra RAMAN, MD;  Location: [facility] INVASIVE CV LAB;  Service: Cardiovascular;  Laterality: N/A;   SPACE OAR INSTILLATION N/A 12/24/2017   Procedure: SPACE OAR INSTILLATION;  Surgeon: Nieves Cough, MD;  Location: [facility] ORS;  Service: Urology;  Laterality: N/A;   TOTAL KNEE ARTHROPLASTY Right 08/16/2017   Procedure: RIGHT TOTAL KNEE ARTHROPLASTY;  Surgeon: Shari Sieving, MD;  Location: [facility] OR;  Service: Orthopedics;  Laterality: Right;   UPPER ESOPHAGEAL ENDOSCOPIC ULTRASOUND (EUS) N/A 12/16/2020   Procedure: UPPER ESOPHAGEAL ENDOSCOPIC ULTRASOUND (EUS);  Surgeon: Rollin Dover, MD;  Location: [facility];   Service: Endoscopy;  Laterality: N/A;    I have reviewed the social history and family history with the patient and they are unchanged from previous note.  ALLERGIES:  is allergic to aspirin , sacubitril -valsartan , sulfa antibiotics, and ace inhibitors.  MEDICATIONS:  Current Outpatient Medications  Medication Sig Dispense Refill   Accu-Chek Softclix Lancets lancets Use to check your blood sugar finger stick once a day Dx Code E11.9 90 days 100 each 3   amiodarone  (PACERONE ) 200 MG tablet Take 1 tablet (200 mg total) by mouth 2 (two) times daily for 14 days, THEN 1 tablet (200 mg total) daily. 180 tablet 3   atorvastatin  (LIPITOR) 40 MG tablet Take 1 tablet (40 mg total) by mouth daily. 90 tablet 3   Biotin 1000 MCG tablet Take 1,000 mcg by mouth daily with breakfast.     Blood Glucose Monitoring Suppl (ACCU-CHEK GUIDE) w/Device KIT as  directed to check blood sugar once a day 1 kit 0   cetirizine  (ZYRTEC ) 10 MG tablet Take 1 tablet (10 mg total) by mouth daily. 30 tablet 0   cholecalciferol  (VITAMIN D3) 25 MCG (1000 UT) tablet Take 1,000 Units by mouth daily with breakfast.     digoxin  (LANOXIN ) 0.125 MG tablet Take 1/2 tablet (0.0625mg  total) by mouth daily. 15 tablet 6   empagliflozin  (JARDIANCE ) 10 MG TABS tablet Take 1 tablet (10 mg total) by mouth daily. 90 tablet 3   EPINEPHrine  0.3 mg/0.3 mL IJ SOAJ injection Inject 0.3 mg into the muscle as needed for anaphylaxis. 2 each 1   ferrous sulfate 325 (65 FE) MG tablet Take 325 mg by mouth daily with breakfast.     furosemide  (LASIX ) 40 MG tablet Take 1 tablet (40 mg total) by mouth every other day. Alternating with 20 mg. 30 tablet 6   gabapentin  (NEURONTIN ) 100 MG capsule Take 1 capsule (100 mg total) by mouth 2 (two) times daily. 180 capsule 1   glucose blood (ACCU-CHEK GUIDE TEST) test strip Use to check blood sugars once daily. 100 each 3   hydrOXYzine  (ATARAX ) 25 MG tablet Take 1/2 tablet (12.5 mg total) by mouth every 8 (eight) hours  as needed for up to 6 doses for itching. 3 tablet 0   ipratropium (ATROVENT ) 0.03 % nasal spray Place 1-2 sprays into both nostrils 2 (two) times daily as needed (nasal drainage). 30 mL 2   lidocaine -prilocaine  (EMLA ) cream Apply 1 Application topically as needed. 30 g 0   lidocaine -prilocaine  (EMLA ) cream Apply to affected area once 30 g 3   metFORMIN  (GLUCOPHAGE -XR) 500 MG 24 hr tablet Take 1 tablet (500 mg total) by mouth daily with evening meal. 90 tablet 3   metoprolol  succinate (TOPROL -XL) 25 MG 24 hr tablet Take 1 tablet (25 mg total) by mouth daily. 90 tablet 3   multivitamin (ONE-A-DAY MEN'S) TABS tablet Take 1 tablet by mouth daily with breakfast.     ondansetron  (ZOFRAN ) 8 MG tablet Take 1 tablet (8 mg total) by mouth every 8 (eight) hours as needed for nausea or vomiting. 30 tablet 1   pantoprazole  (PROTONIX ) 40 MG tablet Take 1 tablet (40 mg total) by mouth daily. 30 tablet 1   pantoprazole  (PROTONIX ) 40 MG tablet Take 1 tablet (40 mg total) by mouth 2 (two) times daily. 180 tablet 3   potassium chloride  (KLOR-CON ) 10 MEQ tablet Take 2 tablets (20 mEq total) by mouth daily. 60 tablet 3   prochlorperazine  (COMPAZINE ) 10 MG tablet Take 1 tablet (10 mg total) by mouth every 6 (six) hours as needed for nausea or vomiting. 30 tablet 1   rivaroxaban  (XARELTO ) 20 MG TABS tablet Take 1 tablet (20 mg total) by mouth daily with supper. 90 tablet 3   spironolactone  (ALDACTONE ) 25 MG tablet Take 0.5 tablets (12.5 mg total) by mouth daily.     sucralfate  (CARAFATE ) 1 GM/10ML suspension Take 10 mLs (1 g total) by mouth 4 (four) times daily -  with meals and at bedtime. 420 mL 1   traMADol  (ULTRAM ) 50 MG tablet Take 1 tablet (50 mg total) by mouth every 6 (six) hours as needed. 60 tablet 0   valsartan  (DIOVAN ) 40 MG tablet Take 1 tablet (40 mg total) by mouth 2 (two) times daily. 60 tablet 11   valsartan  (DIOVAN ) 40 MG tablet Take 0.5 tablets (20 mg total) by mouth 2 (two) times daily.     No  current  facility-administered medications for this visit.   Facility-Administered Medications Ordered in Other Visits  Medication Dose Route Frequency Provider Last Rate Last Admin   0.9 %  sodium chloride  infusion   Intravenous Continuous Lanny Callander, MD   Stopped at 12/09/23 1634   sodium chloride  flush (NS) 0.9 % injection 10 mL  10 mL Intracatheter PRN Lanny Callander, MD        PHYSICAL EXAMINATION: ECOG PERFORMANCE STATUS: 1 - Symptomatic but completely ambulatory  Vitals:   12/09/23 1300  BP: 110/68  Pulse: 64  Resp: 18  Temp: 98 F (36.7 C)  SpO2: 99%   Wt Readings from Last 3 Encounters:  12/09/23 157 lb 12.8 oz (71.6 kg)  11/25/23 153 lb (69.4 kg)  11/04/23 155 lb (70.3 kg)     GENERAL:alert, no distress and comfortable SKIN: skin color, texture, turgor are normal, no rashes or significant lesions EYES: normal, Conjunctiva are pink and non-injected, sclera clear NECK: supple, thyroid  normal size, non-tender, without nodularity LYMPH:  no palpable lymphadenopathy in the cervical, axillary  LUNGS: clear to auscultation and percussion with normal breathing effort HEART: regular rate & rhythm and no murmurs and no lower extremity edema ABDOMEN:abdomen soft, non-tender and normal bowel sounds Musculoskeletal:no cyanosis of digits and no clubbing  NEURO: alert & oriented x 3 with fluent speech, no focal motor/sensory deficits  Physical Exam MEASUREMENTS: Weight- 157. HEENT: Tongue normal.  LABORATORY DATA:  I have reviewed the data as listed    Latest Ref Rng & Units 12/09/2023   12:48 PM 11/25/2023   11:02 AM 11/11/2023    7:32 AM  CBC  WBC 4.0 - 10.5 K/uL 6.9  8.9  10.2   Hemoglobin 13.0 - 17.0 g/dL 89.4  8.8  9.0   Hematocrit 39.0 - 52.0 % 33.9  28.4  28.5   Platelets 150 - 400 K/uL 339  426  356         Latest Ref Rng & Units 12/09/2023   12:48 PM 11/25/2023   11:02 AM 11/11/2023    7:32 AM  CMP  Glucose 70 - 99 mg/dL 881  90  897   BUN 8 - 23 mg/dL 31  27   25    Creatinine 0.61 - 1.24 mg/dL 8.86  8.97  8.79   Sodium 135 - 145 mmol/L 133  137  132   Potassium 3.5 - 5.1 mmol/L 4.3  4.1  4.3   Chloride 98 - 111 mmol/L 102  105  98   CO2 22 - 32 mmol/L 26  26  27    Calcium  8.9 - 10.3 mg/dL 9.1  9.1  8.7   Total Protein 6.5 - 8.1 g/dL 7.6  6.6  6.4   Total Bilirubin 0.0 - 1.2 mg/dL 0.4  0.4  0.4   Alkaline Phos 38 - 126 U/L 53  42  48   AST 15 - 41 U/L 18  16  13    ALT 0 - 44 U/L 19  14  12        RADIOGRAPHIC STUDIES: I have personally reviewed the radiological images as listed and agreed with the findings in the report. No results found.    No orders of the defined types were placed in this encounter.  All questions were answered. The patient knows to call the clinic with any problems, questions or concerns. No barriers to learning was detected. The total time spent in the appointment was 25 minutes, including review of chart and various tests results, discussions  about plan of care and coordination of care plan     Onita Mattock, MD 12/09/2023

## 2023-12-09 NOTE — Progress Notes (Signed)
 Nutrition Follow-up:  Patient with gastric prostate cancer followed by Dr Lanny.  Patient with gastric cancer recurrence.  Receiving nivolumab , ipilimumab .    Met with patient during infusion and wife.  Reports that he is eating and trying to maintain weight.  Drinking Fairlife, oywn shakes. Drinking Fairlife milk as well.  Eating meats, vegetables, fruits, yogurt.  Having normal bowel movement 1-2 a day.  No nausea.      Medications: reviewed  Labs: reviewed  Anthropometrics:   Weight 157 lb 12.8 oz today  155 lb on 9/2  173 lb on 05/14/23   NUTRITION DIAGNOSIS: Unintentional weight loss improved   INTERVENTION:  Continue oral nutrition supplements for added nutrition Continue well balanced diet including foods rich in protein    MONITORING, EVALUATION, GOAL: weight trends, intake   NEXT VISIT: Monday, Nov 17 during infusion  Mailyn Steichen B. Dasie SOLON, CSO, LDN Registered Dietitian [phone]

## 2023-12-09 NOTE — Assessment & Plan Note (Signed)
-  diagnosed with Gleason 4+4 prostate cancer in early 2019. He was treated with long-term ADT in combination with 8 weeks of IMRT.

## 2023-12-09 NOTE — Patient Instructions (Signed)
 [facility] - A DEPT OF [facility]  Discharge Instructions: Thank you for choosing [facility] to provide your oncology and hematology care.   If you have a lab appointment with the Cancer Center, please go directly to the Cancer Center and check in at the registration area.   Wear comfortable clothing and clothing appropriate for easy access to any Portacath or PICC line.   We strive to give you quality time with your provider. You may need to reschedule your appointment if you arrive late (15 or more minutes).  Arriving late affects you and other patients whose appointments are after yours.  Also, if you miss three or more appointments without notifying the office, you may be dismissed from the clinic at the provider's discretion.      For prescription refill requests, have your pharmacy contact our office and allow 72 hours for refills to be completed.    Today you received the following chemotherapy and/or immunotherapy agents opdivo and yervoy      To help prevent nausea and vomiting after your treatment, we encourage you to take your nausea medication as directed.  BELOW ARE SYMPTOMS THAT SHOULD BE REPORTED IMMEDIATELY: *FEVER GREATER THAN 100.4 F (38 C) OR HIGHER *CHILLS OR SWEATING *NAUSEA AND VOMITING THAT IS NOT CONTROLLED WITH YOUR NAUSEA MEDICATION *UNUSUAL SHORTNESS OF BREATH *UNUSUAL BRUISING OR BLEEDING *URINARY PROBLEMS (pain or burning when urinating, or frequent urination) *BOWEL PROBLEMS (unusual diarrhea, constipation, pain near the anus) TENDERNESS IN MOUTH AND THROAT WITH OR WITHOUT PRESENCE OF ULCERS (sore throat, sores in mouth, or a toothache) UNUSUAL RASH, SWELLING OR PAIN  UNUSUAL VAGINAL DISCHARGE OR ITCHING   Items with * indicate a potential emergency and should be followed up as soon as possible or go to the Emergency Department if any problems should occur.  Please show the CHEMOTHERAPY ALERT CARD or  IMMUNOTHERAPY ALERT CARD at check-in to the Emergency Department and triage nurse.  Should you have questions after your visit or need to cancel or reschedule your appointment, please contact [facility] - A DEPT [facility]  Dept: [phone]  and follow the prompts.  Office hours are 8:00 a.m. to 4:30 p.m. Monday - Friday. Please note that voicemails left after 4:00 p.m. may not be returned until the following business day.  We are closed weekends and major holidays. You have access to a nurse at all times for urgent questions. Please call the main number to the clinic Dept: [phone] and follow the prompts.   For any non-urgent questions, you may also contact your provider using MyChart. We now offer e-Visits for anyone 83 and older to request care online for non-urgent symptoms. For details visit [email or web address].   Also download the MyChart app! Go to the app store, search "MyChart", open the app, select [facility], and log in with your MyChart username and password.

## 2023-12-09 NOTE — Assessment & Plan Note (Signed)
 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  S/p FLOT4 01/02/2021 - 03/22/2021 -Posttreatment EUS 05/2021 and 06/29/2022 by Dr. Rollin showed a scar and residual erythema in stomach, biopsy negative for residual malignancy.  -Jon Williams is clinically doing well on surveillance. Last CT 03/20/22 which shows no evidence of recurrent or metastatic disease, and stable bilateral adrenal adenomas -Surveillance CT scan from October 09, 2022 showed no evidence of recurrence -Surveillance CT scan from April 30, 2023 showed no evidence of residual or recurrent malignancy.  Small liver lesion is stable, favored to be benign. -He unfortunately had local recurrence in 09/2023 -he started Nivo 240mg  q2w and Ipi 1mg /kg q6w on 10/28/2023

## 2023-12-09 NOTE — Progress Notes (Signed)
 Ipilimumab  (YERVOY ) Patient Monitoring Assessment   Is the patient experiencing any of the following general symptoms?:  [] Difficulty performing normal activities [] Feeling sluggish or cold all the time [] Unusual weight gain [] Constant or unusual headaches [] Feeling dizzy or faint [] Changes in eyesight (blurry vision, double vision, or other vision problems) [] Changes in mood or behavior (ex: decreased sex drive, irritability, or forgetfulness) [] Starting new medications (ex: steroids, other medications that lower immune response) [x] Patient is not experiencing any of the general symptoms above.    Gastrointestinal  Patient is having 1-2 bowel movements each day.  Is this different from baseline? [] Yes [x] No Are your stools watery or do they have a foul smell? [] Yes [x] No Have you seen blood in your stools? [] Yes [x] No Are your stools dark, tarry, or sticky? [] Yes [x] No Are you having pain or tenderness in your belly? [] Yes [x] No  Skin Does your skin itch? [x] Yes [] No Dry skin on abdomen Do you have a rash? [] Yes [x] No Has your skin blistered and/or peeled? [] Yes [x] No Do you have sores in your mouth? [] Yes [x] No  Hepatic Has your urine been dark or tea colored? [] Yes [x] No Have you noticed that your skin or the whites of your eyes are turning yellow? [] Yes [x] No Are you bleeding or bruising more easily than normal? [] Yes [x] No Are you nauseous and/or vomiting? [] Yes [x] No Do you have pain on the right side of your stomach? [] Yes [x] No  Neurologic  Are you having unusual weakness of legs, arms, or face? [] Yes [x] No Are you having numbness or tingling in your hands or feet? [x] Yes [] No  Edward Guthmiller P Porchia Sinkler   [id number]- Per Dr. Lanny ok to treat

## 2023-12-10 LAB — T4: T4, Total: 11.5 ug/dL (ref 4.5–12.0)

## 2023-12-10 LAB — FERRITIN: Ferritin: 72 ng/mL (ref 24–336)

## 2023-12-11 DIAGNOSIS — H6123 Impacted cerumen, bilateral: Secondary | ICD-10-CM | POA: Diagnosis not present

## 2023-12-12 ENCOUNTER — Other Ambulatory Visit: Payer: Self-pay | Admitting: Hematology

## 2023-12-13 ENCOUNTER — Encounter: Payer: Self-pay | Admitting: Hematology

## 2023-12-13 DIAGNOSIS — I5022 Chronic systolic (congestive) heart failure: Secondary | ICD-10-CM | POA: Diagnosis not present

## 2023-12-13 DIAGNOSIS — E0851 Diabetes mellitus due to underlying condition with diabetic peripheral angiopathy without gangrene: Secondary | ICD-10-CM | POA: Diagnosis not present

## 2023-12-13 DIAGNOSIS — I739 Peripheral vascular disease, unspecified: Secondary | ICD-10-CM | POA: Diagnosis not present

## 2023-12-13 DIAGNOSIS — I428 Other cardiomyopathies: Secondary | ICD-10-CM | POA: Diagnosis not present

## 2023-12-13 DIAGNOSIS — I4891 Unspecified atrial fibrillation: Secondary | ICD-10-CM | POA: Diagnosis not present

## 2023-12-13 DIAGNOSIS — E1169 Type 2 diabetes mellitus with other specified complication: Secondary | ICD-10-CM | POA: Diagnosis not present

## 2023-12-13 DIAGNOSIS — C61 Malignant neoplasm of prostate: Secondary | ICD-10-CM | POA: Diagnosis not present

## 2023-12-16 ENCOUNTER — Inpatient Hospital Stay: Attending: Hematology

## 2023-12-16 ENCOUNTER — Telehealth: Payer: Self-pay

## 2023-12-16 ENCOUNTER — Telehealth: Payer: Self-pay | Admitting: Hematology

## 2023-12-16 ENCOUNTER — Other Ambulatory Visit: Payer: Self-pay

## 2023-12-16 DIAGNOSIS — Z5112 Encounter for antineoplastic immunotherapy: Secondary | ICD-10-CM | POA: Insufficient documentation

## 2023-12-16 DIAGNOSIS — C163 Malignant neoplasm of pyloric antrum: Secondary | ICD-10-CM | POA: Insufficient documentation

## 2023-12-16 DIAGNOSIS — Z79620 Long term (current) use of immunosuppressive biologic: Secondary | ICD-10-CM | POA: Insufficient documentation

## 2023-12-16 NOTE — Telephone Encounter (Signed)
 Open in error

## 2023-12-16 NOTE — Telephone Encounter (Signed)
 Called the patient to update him on upcoming appts. Pt is aware of the appt and times.

## 2023-12-18 DIAGNOSIS — H6122 Impacted cerumen, left ear: Secondary | ICD-10-CM | POA: Diagnosis not present

## 2023-12-19 ENCOUNTER — Other Ambulatory Visit (HOSPITAL_COMMUNITY): Payer: Self-pay

## 2023-12-23 ENCOUNTER — Inpatient Hospital Stay: Admitting: Hematology

## 2023-12-23 ENCOUNTER — Inpatient Hospital Stay

## 2023-12-23 VITALS — BP 106/74 | HR 60 | Temp 97.2°F | Resp 16 | Wt 157.0 lb

## 2023-12-23 DIAGNOSIS — C61 Malignant neoplasm of prostate: Secondary | ICD-10-CM

## 2023-12-23 DIAGNOSIS — Z79620 Long term (current) use of immunosuppressive biologic: Secondary | ICD-10-CM | POA: Diagnosis not present

## 2023-12-23 DIAGNOSIS — C163 Malignant neoplasm of pyloric antrum: Secondary | ICD-10-CM | POA: Diagnosis not present

## 2023-12-23 DIAGNOSIS — Z5112 Encounter for antineoplastic immunotherapy: Secondary | ICD-10-CM | POA: Diagnosis not present

## 2023-12-23 LAB — CBC WITH DIFFERENTIAL (CANCER CENTER ONLY)
Abs Immature Granulocytes: 0.01 K/uL (ref 0.00–0.07)
Basophils Absolute: 0 K/uL (ref 0.0–0.1)
Basophils Relative: 0 %
Eosinophils Absolute: 0.1 K/uL (ref 0.0–0.5)
Eosinophils Relative: 1 %
HCT: 36.6 % — ABNORMAL LOW (ref 39.0–52.0)
Hemoglobin: 11.3 g/dL — ABNORMAL LOW (ref 13.0–17.0)
Immature Granulocytes: 0 %
Lymphocytes Relative: 14 %
Lymphs Abs: 0.7 K/uL (ref 0.7–4.0)
MCH: 28.5 pg (ref 26.0–34.0)
MCHC: 30.9 g/dL (ref 30.0–36.0)
MCV: 92.2 fL (ref 80.0–100.0)
Monocytes Absolute: 0.7 K/uL (ref 0.1–1.0)
Monocytes Relative: 14 %
Neutro Abs: 3.6 K/uL (ref 1.7–7.7)
Neutrophils Relative %: 71 %
Platelet Count: 213 K/uL (ref 150–400)
RBC: 3.97 MIL/uL — ABNORMAL LOW (ref 4.22–5.81)
RDW: 19.6 % — ABNORMAL HIGH (ref 11.5–15.5)
WBC Count: 5.2 K/uL (ref 4.0–10.5)
nRBC: 0 % (ref 0.0–0.2)

## 2023-12-23 LAB — CMP (CANCER CENTER ONLY)
ALT: 30 U/L (ref 0–44)
AST: 25 U/L (ref 15–41)
Albumin: 4.1 g/dL (ref 3.5–5.0)
Alkaline Phosphatase: 52 U/L (ref 38–126)
Anion gap: 5 (ref 5–15)
BUN: 24 mg/dL — ABNORMAL HIGH (ref 8–23)
CO2: 28 mmol/L (ref 22–32)
Calcium: 9.2 mg/dL (ref 8.9–10.3)
Chloride: 103 mmol/L (ref 98–111)
Creatinine: 1.23 mg/dL (ref 0.61–1.24)
GFR, Estimated: >60 mL/min (ref >60)
Glucose, Bld: 100 mg/dL — ABNORMAL HIGH (ref 70–99)
Potassium: 4.7 mmol/L (ref 3.5–5.1)
Sodium: 136 mmol/L (ref 135–145)
Total Bilirubin: 0.5 mg/dL (ref 0.0–1.2)
Total Protein: 6.9 g/dL (ref 6.5–8.1)

## 2023-12-23 MED ORDER — SODIUM CHLORIDE 0.9 % IV SOLN
240.0000 mg | Freq: Once | INTRAVENOUS | Status: AC
  Administered 2023-12-23: 240 mg via INTRAVENOUS
  Filled 2023-12-23: qty 24

## 2023-12-23 MED ORDER — SODIUM CHLORIDE 0.9 % IV SOLN: INTRAVENOUS | Status: DC

## 2023-12-23 NOTE — Assessment & Plan Note (Signed)
-  diagnosed with Gleason 4+4 prostate cancer in early 2019. He was treated with long-term ADT in combination with 8 weeks of IMRT.

## 2023-12-23 NOTE — Progress Notes (Signed)
 [facility]   [phone] [phone]   Clinic Follow up Note   Patient Care Team: [name], MD as PCP - General (Internal Medicine) [name], Aleene PARAS, MD (Inactive) as PCP - Cardiology (Cardiology) Waddell Danelle ORN, MD as PCP - Electrophysiology (Cardiology) Dasie Leonor CROME, MD as Consulting Physician (General Surgery) Lanny Callander, MD as Consulting Physician (Hematology) Rollin Dover, MD as Consulting Physician (Gastroenterology)  Date of Service:  12/23/2023  CHIEF COMPLAINT: f/u of recurrent gastric cancer  CURRENT THERAPY:  Nivo and ipilimumab   Oncology History   Malignant neoplasm of prostate [facility]) -diagnosed with Gleason 4+4 prostate cancer in early 2019. He was treated with long-term ADT in combination with 8 weeks of IMRT.   Gastric cancer (HCC) cT3N0M0, stage II -Diagnosed 11/2020, path showed loss of MLH1 and PMS2 which predicts good response to immunotherapy  -Deemed a high risk surgical candidate.  S/p FLOT4 01/02/2021 - 03/22/2021 -Posttreatment EUS 05/2021 and 06/29/2022 by Dr. Rollin showed a scar and residual erythema in stomach, biopsy negative for residual malignancy.  -[name] is clinically doing well on surveillance. Last CT 03/20/22 which shows no evidence of recurrent or metastatic disease, and stable bilateral adrenal adenomas -Surveillance CT scan from October 09, 2022 showed no evidence of recurrence -Surveillance CT scan from April 30, 2023 showed no evidence of residual or recurrent malignancy.  Small liver lesion is stable, favored to be benign. -He unfortunately had local recurrence in 09/2023 -he started Nivo 240mg  q2w and Ipi 1mg /kg q6w (for 2 doses only) on 10/28/2023  Assessment & Plan Recurrent gastric cancer of the pyloric antrum Recurrent gastric cancer of the pyloric antrum, currently undergoing treatment with immunotherapy. No reported side effects from the treatment. Blood counts are improving, indicating  reduced bleeding. Weight is stable with some gain, and appetite is good. Liver function and protein levels are normal. No stomach pain reported. Awaiting repeat scan to assess treatment response. - Continue current immunotherapy regimen with nivolumab . - Scheduled PET scan for late December or early January, pending insurance approval. - Monitor for side effects such as rash, diarrhea, or joint pain. - Encouraged hydration and monitoring of bowel movements and urination. - Will consider extending nivolumab  dosing interval to every four weeks if skin condition remains stable.  Congestive heart failure -Clinically stable, requiring monitoring of fluid intake and swelling. No current swelling reported. - Continue to monitor fluid intake and swelling. - Encouraged maintaining hydration and monitoring for any changes.  Plan - He is clinically doing well, pain resolved, anemia improved.  He is clinically responding to treatment. - Lab reviewed, will proceed cycle 2-day 15 nivolumab  today, and continue nivolumab  every 2 weeks - Follow-up in 2 weeks. - Repeat PET scan after Christmas   SUMMARY OF ONCOLOGIC HISTORY: Oncology History Overview Note  Cancer Staging Gastric cancer [facility]) Staging form: Stomach, AJCC 8th Edition - Clinical stage from 12/01/2020: Stage IIB (cT3, cN0, cM0) - Signed by Lanny Callander, MD on 12/20/2020 Stage prefix: Initial diagnosis Total positive nodes: 0  Malignant neoplasm of prostate (HCC) Staging form: Prostate, AJCC 8th Edition - Clinical: Stage IIC (cT2b, cN0, cM0, PSA: 6.7, Grade Group: 4) - Unsigned Prostate specific antigen (PSA) range: Less than 10 Gleason score: 8 Histologic grading system: 5 grade system    Gastric cancer (HCC)  12/01/2020 Procedure   Upper Endoscopy, Dr. Rollin  Impression: - Normal esophagus. 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 Biopsied. - Normal examined duodenum.   12/01/2020 Pathology Results  FINAL MICROSCOPIC DIAGNOSIS:    A. INCISURA, BIOPSY:  - Adenocarcinoma, moderate to poorly differentiated arising in a  background of chronic gastritis with intestinal metaplasia.    12/01/2020 Imaging   CT CAP  IMPRESSION: Focal wall thickening along the posterior aspect of the gastric antrum, likely corresponding to the patient's newly diagnosed gastric cancer.   No findings suspicious for metastatic disease.   Mild multifocal pneumonia, lower lobe predominant, likely on the basis of aspiration. Trace right pleural effusion.   Fiducial markers along the prostate in this patient with known prostate cancer.   12/01/2020 Cancer Staging   Staging form: Stomach, AJCC 8th Edition - Clinical stage from 12/01/2020: Stage IIB (cT3, cN0, cM0) - Signed by Lanny Callander, MD on 12/20/2020 Stage prefix: Initial diagnosis Total positive nodes: 0   12/08/2020 Initial Diagnosis   Gastric cancer (HCC)   12/16/2020 Procedure   EUS  - Wall thickening was seen in the lesser curve of the stomach and in the antrum of the stomach. The thickening appeared to be primarily within the serosa (Layer 5). T3 N0 Mx. - There was no sign of significant pathology in the entire pancreas. - There was no sign of significant pathology in the common bile duct and in the gallbladder. - There was no evidence of significant pathology in the left lobe of the liver. - Endosonographic images of the left adrenal gland were unremarkable. - The celiac trunk and superior mesenteric artery were endosonographically normal. - The mediastinum was unremarkable endosonographically. - No specimens collected.   01/02/2021 - 03/23/2021 Chemotherapy   Patient is on Treatment Plan : GASTROESOPHAGEAL FLOT q14d X 4 cycles     04/01/2021 Imaging   EXAM: CT ABDOMEN AND PELVIS WITHOUT CONTRAST  IMPRESSION: 1. No acute findings in the abdomen or pelvis. Specifically, no evidence for metastatic disease in the abdomen or pelvis. 2. Stable 16 mm left adrenal  adenoma. 3. Small fat containing hernias in the right groin and umbilical region. 4. Bilateral pars interarticularis defects at L4 with 10 mm anterolisthesis of L4 on 5, stable. 5. Aortic Atherosclerosis (ICD10-I70.0).   04/13/2021 Imaging   EXAM: CT CHEST WITHOUT CONTRAST  IMPRESSION: 1. No evidence of metastatic disease in the chest. 2. Enlargement of the main pulmonary artery, as can be seen in pulmonary hypertension. 3. Coronary artery disease.   Aortic Atherosclerosis (ICD10-I70.0).   06/09/2021 Procedure   Upper GI Endoscopy, Dr. Rollin  Findings: -The esophagus was normal. -A medium healed ulcer was found on the lesser curvature of the stomach. Biopsies were taken with a cold forceps for histology. -The examined duodenum was normal. -Compared to the prior EGD there is a marked improvement in his gastric cancer. There was only evidence of erythema and a central scar at the incisura. Cold biopsies of the area were obtained, but there was no gross evidence of malignancy.  Impression: - Normal esophagus. - Scar in the lesser curvature of the stomach. Biopsied. - Normal examined duodenum.   06/09/2021 Pathology Results   FINAL MICROSCOPIC DIAGNOSIS:   A. STOMACH, BIOPSY:  - Gastric mucosa with acute and chronic inflammation.  - No dysplasia or malignancy identified.    06/16/2021 - 06/16/2021 Chemotherapy   Patient is on Treatment Plan : GASTROESOPHAGEAL Pembrolizumab (200) q21d     10/28/2023 -  Chemotherapy   Patient is on Treatment Plan : GASTRIC Nivolumab  (240) D1,15,29 + Ipilimumab  (1) D1 q42d        Discussed the use of AI scribe software for  clinical note transcription with the patient, who gave verbal consent to proceed.  History of Present Illness Jon Williams is a 73 year old male with recurrent gastric cancer who presents for follow-up.  He experiences no problems following his last infusion and reports no stomach pain. He actively maintains his weight with  Ensure and protein drinks, resulting in weight gain since last month. Blood counts have improved, with the most recent count at 11.3, up from 10.5 and 8.8 in previous visits.  He denies any side effects from treatment, such as joint pain or rash, and uses lotion to prevent skin dryness. Liver function and protein levels are normal. He maintains a good appetite and is mindful of hydration, especially with occasional cold sweats. No swelling is noted, and he manages fluid intake carefully due to heart problems.     All other systems were reviewed with the patient and are negative.  MEDICAL HISTORY:  Past Medical History:  Diagnosis Date   AICD (automatic cardioverter/defibrillator) present 05/25/2016   biv icd   Angio-edema    Bunion, right    Chronic combined systolic and diastolic CHF (congestive heart failure) (HCC)    Echo 1/18: Mild conc LVH, EF 15-20, severe diff HK, inf and inf-septal AK, Gr 3 DD, mild to mod MR, severe LAE, mod reduced RVSF, mod RAE, mild TR, PASP 50   Coronary artery disease involving native coronary artery without angina pectoris 04/17/2016   LHC 1/18: pLCx 30, mLCx 20, mRCA 40, dRCA 20, LVEDP 23, mean RA 8, PA 42/20, PCWP 17   Diabetes mellitus without complication (HCC)    Gastric cancer (HCC)    History of atrial fibrillation    History of atrial flutter    History of cardiomegaly 06/07/2016   Noted on CXR   History of colon polyps 06/28/2017   Noted on colonoscopy   Hypertension    LBBB (left bundle branch block)    Nausea vomiting and diarrhea 04/04/2021   NICM (nonischemic cardiomyopathy) (HCC)    Echo 1/18:  Mild conc LVH, EF 15-20, severe diff HK, inf and inf-septal AK, Gr 3 DD, mild to mod MR, severe LAE, mod reduced RVSF, mod RAE, mild TR, PASP 50   Other secondary pulmonary hypertension (HCC) 04/17/2016   Prostate cancer (HCC) 2019   Sigmoid diverticulosis 06/28/2017   Noted on colonoscopy    SURGICAL HISTORY: Past Surgical History:   Procedure Laterality Date   BIOPSY  12/01/2020   Procedure: BIOPSY;  Surgeon: Rollin Dover, MD;  Location: [facility] ENDOSCOPY;  Service: Endoscopy;;   BIOPSY  06/09/2021   Procedure: BIOPSY;  Surgeon: Rollin Dover, MD;  Location: [facility] ENDOSCOPY;  Service: Gastroenterology;;   BIOPSY  06/29/2022   Procedure: BIOPSY;  Surgeon: Rollin Dover, MD;  Location: [facility] ENDOSCOPY;  Service: Gastroenterology;;   BIV ICD INSERTION CRT-D N/A 05/25/2016   Procedure: BiV ICD Insertion CRT-D;  Surgeon: Danelle LELON Birmingham, MD;  Location: [facility] INVASIVE CV LAB;  Service: Cardiovascular;  Laterality: N/A;   CARDIAC CATHETERIZATION N/A 03/02/2016   Procedure: Right/Left Heart Cath and Coronary Angiography;  Surgeon: Lonni Hanson, MD;  Location: [facility] INVASIVE CV LAB;  Service: Cardiovascular;  Laterality: N/A;   CARDIOVERSION N/A 07/17/2016   Procedure: Cardioversion;  Surgeon: Birmingham Danelle LELON, MD;  Location: [facility] INVASIVE CV LAB;  Service: Cardiovascular;  Laterality: N/A;   COLONOSCOPY WITH PROPOFOL  N/A 06/28/2017   Procedure: COLONOSCOPY WITH PROPOFOL ;  Surgeon: Rollin Dover, MD;  Location: [facility] ENDOSCOPY;  Service: Endoscopy;  Laterality: N/A;  colonscopy  2009   ESOPHAGOGASTRODUODENOSCOPY N/A 12/01/2020   Procedure: ESOPHAGOGASTRODUODENOSCOPY (EGD);  Surgeon: Rollin Dover, MD;  Location: [facility] ENDOSCOPY;  Service: Endoscopy;  Laterality: N/A;  IDA/guaiac positive stools   ESOPHAGOGASTRODUODENOSCOPY N/A 10/04/2023   Procedure: EGD (ESOPHAGOGASTRODUODENOSCOPY);  Surgeon: Rollin Dover, MD;  Location: [facility] ENDOSCOPY;  Service: Gastroenterology;  Laterality: N/A;   ESOPHAGOGASTRODUODENOSCOPY (EGD) WITH PROPOFOL  N/A 12/16/2020   Procedure: ESOPHAGOGASTRODUODENOSCOPY (EGD) WITH PROPOFOL ;  Surgeon: Rollin Dover, MD;  Location: [facility] ENDOSCOPY;  Service: Endoscopy;  Laterality: N/A;   ESOPHAGOGASTRODUODENOSCOPY (EGD) WITH PROPOFOL  N/A 06/09/2021   Procedure: ESOPHAGOGASTRODUODENOSCOPY (EGD) WITH PROPOFOL ;  Surgeon: Rollin Dover, MD;  Location: [facility]  ENDOSCOPY;  Service: Gastroenterology;  Laterality: N/A;   ESOPHAGOGASTRODUODENOSCOPY (EGD) WITH PROPOFOL  N/A 06/29/2022   Procedure: ESOPHAGOGASTRODUODENOSCOPY (EGD) WITH PROPOFOL ;  Surgeon: Rollin Dover, MD;  Location: [facility] ENDOSCOPY;  Service: Gastroenterology;  Laterality: N/A;   GOLD SEED IMPLANT N/A 12/24/2017   Procedure: GOLD SEED IMPLANT, TRANSERINEAL;  Surgeon: Nieves Cough, MD;  Location: [facility] ORS;  Service: Urology;  Laterality: N/A;   INSERT / REPLACE / REMOVE PACEMAKER     IR ANGIOGRAM SELECTIVE EACH ADDITIONAL VESSEL  11/04/2023   IR ANGIOGRAM SELECTIVE EACH ADDITIONAL VESSEL  11/04/2023   IR CV LINE INJECTION  10/25/2023   IR REMOVAL TUN ACCESS W/ PORT W/O FL MOD SED  11/04/2023   IR US  GUIDE VASC ACCESS RIGHT  11/04/2023   IR VENOCAVAGRAM SVC  11/04/2023   LEAD REVISION  10/10/2018   LEAD REVISION/REPAIR N/A 10/10/2018   Procedure: LEAD REVISION/REPAIR;  Surgeon: Waddell Danelle ORN, MD;  Location: [facility] INVASIVE CV LAB;  Service: Cardiovascular;  Laterality: N/A;   POLYPECTOMY  06/28/2017   Procedure: POLYPECTOMY;  Surgeon: Rollin Dover, MD;  Location: [facility] ENDOSCOPY;  Service: Endoscopy;;  ascending and descending colon polyp   PORTACATH PLACEMENT Right 12/23/2020   Procedure: INSERTION PORT-A-CATH;  Surgeon: Dasie Leonor CROME, MD;  Location: [facility] OR;  Service: General;  Laterality: Right;   PROSTATE BIOPSY  02/20/2017   RIGHT HEART CATH N/A 07/25/2023   Procedure: RIGHT HEART CATH;  Surgeon: Rolan Ezra RAMAN, MD;  Location: [facility] INVASIVE CV LAB;  Service: Cardiovascular;  Laterality: N/A;   RIGHT/LEFT HEART CATH AND CORONARY ANGIOGRAPHY N/A 10/30/2022   Procedure: RIGHT/LEFT HEART CATH AND CORONARY ANGIOGRAPHY;  Surgeon: Rolan Ezra RAMAN, MD;  Location: [facility] INVASIVE CV LAB;  Service: Cardiovascular;  Laterality: N/A;   SPACE OAR INSTILLATION N/A 12/24/2017   Procedure: SPACE OAR INSTILLATION;  Surgeon: Nieves Cough, MD;  Location: [facility] ORS;  Service: Urology;  Laterality: N/A;   TOTAL KNEE  ARTHROPLASTY Right 08/16/2017   Procedure: RIGHT TOTAL KNEE ARTHROPLASTY;  Surgeon: Shari Sieving, MD;  Location: [facility] OR;  Service: Orthopedics;  Laterality: Right;   UPPER ESOPHAGEAL ENDOSCOPIC ULTRASOUND (EUS) N/A 12/16/2020   Procedure: UPPER ESOPHAGEAL ENDOSCOPIC ULTRASOUND (EUS);  Surgeon: Rollin Dover, MD;  Location: [facility] ENDOSCOPY;  Service: Endoscopy;  Laterality: N/A;    I have reviewed the social history and family history with the patient and they are unchanged from previous note.  ALLERGIES:  is allergic to aspirin , sacubitril -valsartan , sulfa antibiotics, and ace inhibitors.  MEDICATIONS:  Current Outpatient Medications  Medication Sig Dispense Refill   Accu-Chek Softclix Lancets lancets Use to check your blood sugar finger stick once a day Dx Code E11.9 90 days 100 each 3   amiodarone  (PACERONE ) 200 MG tablet Take 1 tablet (200 mg total) by mouth 2 (two) times daily for 14 days, THEN 1 tablet (200 mg total) daily. 180  tablet 3   atorvastatin  (LIPITOR) 40 MG tablet Take 1 tablet (40 mg total) by mouth daily. 90 tablet 3   Biotin 1000 MCG tablet Take 1,000 mcg by mouth daily with breakfast.     Blood Glucose Monitoring Suppl (ACCU-CHEK GUIDE) w/Device KIT as directed to check blood sugar once a day 1 kit 0   cetirizine  (ZYRTEC ) 10 MG tablet Take 1 tablet (10 mg total) by mouth daily. 30 tablet 0   cholecalciferol  (VITAMIN D3) 25 MCG (1000 UT) tablet Take 1,000 Units by mouth daily with breakfast.     digoxin  (LANOXIN ) 0.125 MG tablet Take 1/2 tablet (0.0625mg  total) by mouth daily. 15 tablet 6   empagliflozin  (JARDIANCE ) 10 MG TABS tablet Take 1 tablet (10 mg total) by mouth daily. 90 tablet 3   EPINEPHrine  0.3 mg/0.3 mL IJ SOAJ injection Inject 0.3 mg into the muscle as needed for anaphylaxis. 2 each 1   ferrous sulfate 325 (65 FE) MG tablet Take 325 mg by mouth daily with breakfast.     furosemide  (LASIX ) 40 MG tablet Take 1 tablet (40 mg total) by mouth every other day.  Alternating with 20 mg. 30 tablet 6   gabapentin  (NEURONTIN ) 100 MG capsule Take 1 capsule (100 mg total) by mouth 2 (two) times daily. 180 capsule 1   glucose blood (ACCU-CHEK GUIDE TEST) test strip Use to check blood sugars once daily. 100 each 3   hydrOXYzine  (ATARAX ) 25 MG tablet Take 1/2 tablet (12.5 mg total) by mouth every 8 (eight) hours as needed for up to 6 doses for itching. 3 tablet 0   ipratropium (ATROVENT ) 0.03 % nasal spray Place 1-2 sprays into both nostrils 2 (two) times daily as needed (nasal drainage). 30 mL 2   lidocaine -prilocaine  (EMLA ) cream Apply 1 Application topically as needed. 30 g 0   lidocaine -prilocaine  (EMLA ) cream Apply to affected area once 30 g 3   metFORMIN  (GLUCOPHAGE -XR) 500 MG 24 hr tablet Take 1 tablet (500 mg total) by mouth daily with evening meal. 90 tablet 3   metoprolol  succinate (TOPROL -XL) 25 MG 24 hr tablet Take 1 tablet (25 mg total) by mouth daily. 90 tablet 3   multivitamin (ONE-A-DAY MEN'S) TABS tablet Take 1 tablet by mouth daily with breakfast.     ondansetron  (ZOFRAN ) 8 MG tablet Take 1 tablet (8 mg total) by mouth every 8 (eight) hours as needed for nausea or vomiting. 30 tablet 1   pantoprazole  (PROTONIX ) 40 MG tablet Take 1 tablet (40 mg total) by mouth daily. 30 tablet 1   pantoprazole  (PROTONIX ) 40 MG tablet Take 1 tablet (40 mg total) by mouth 2 (two) times daily. 180 tablet 3   potassium chloride  (KLOR-CON ) 10 MEQ tablet Take 2 tablets (20 mEq total) by mouth daily. 60 tablet 3   prochlorperazine  (COMPAZINE ) 10 MG tablet Take 1 tablet (10 mg total) by mouth every 6 (six) hours as needed for nausea or vomiting. 30 tablet 1   rivaroxaban  (XARELTO ) 20 MG TABS tablet Take 1 tablet (20 mg total) by mouth daily with supper. 90 tablet 3   spironolactone  (ALDACTONE ) 25 MG tablet Take 0.5 tablets (12.5 mg total) by mouth daily.     sucralfate  (CARAFATE ) 1 GM/10ML suspension Take 10 mLs (1 g total) by mouth 4 (four) times daily -  with meals and  at bedtime. 420 mL 1   traMADol  (ULTRAM ) 50 MG tablet Take 1 tablet (50 mg total) by mouth every 6 (six) hours as needed. 60 tablet  0   valsartan  (DIOVAN ) 40 MG tablet Take 1 tablet (40 mg total) by mouth 2 (two) times daily. 60 tablet 11   valsartan  (DIOVAN ) 40 MG tablet Take 0.5 tablets (20 mg total) by mouth 2 (two) times daily.     No current facility-administered medications for this visit.   Facility-Administered Medications Ordered in Other Visits  Medication Dose Route Frequency Provider Last Rate Last Admin   0.9 %  sodium chloride  infusion   Intravenous Continuous Lanny Callander, MD   Stopped at 12/23/23 1324    PHYSICAL EXAMINATION: ECOG PERFORMANCE STATUS: 1 - Symptomatic but completely ambulatory  Vitals:   12/23/23 1120  BP: 106/74  Pulse: 60  Resp: 16  Temp: (!) 97.2 F (36.2 C)  SpO2: 100%   Wt Readings from Last 3 Encounters:  12/23/23 157 lb (71.2 kg)  12/09/23 157 lb 12.8 oz (71.6 kg)  11/25/23 153 lb (69.4 kg)     GENERAL:alert, no distress and comfortable SKIN: skin color, texture, turgor are normal, no rashes or significant lesions EYES: normal, Conjunctiva are pink and non-injected, sclera clear NECK: supple, thyroid  normal size, non-tender, without nodularity LYMPH:  no palpable lymphadenopathy in the cervical, axillary  LUNGS: clear to auscultation and percussion with normal breathing effort HEART: regular rate & rhythm and no murmurs and no lower extremity edema ABDOMEN:abdomen soft, non-tender and normal bowel sounds Musculoskeletal:no cyanosis of digits and no clubbing  NEURO: alert & oriented x 3 with fluent speech, no focal motor/sensory deficits  Physical Exam   LABORATORY DATA:  I have reviewed the data as listed    Latest Ref Rng & Units 12/23/2023   10:41 AM 12/09/2023   12:48 PM 11/25/2023   11:02 AM  CBC  WBC 4.0 - 10.5 K/uL 5.2  6.9  8.9   Hemoglobin 13.0 - 17.0 g/dL 88.6  89.4  8.8   Hematocrit 39.0 - 52.0 % 36.6  33.9  28.4    Platelets 150 - 400 K/uL 213  339  426         Latest Ref Rng & Units 12/23/2023   10:41 AM 12/09/2023   12:48 PM 11/25/2023   11:02 AM  CMP  Glucose 70 - 99 mg/dL 899  881  90   BUN 8 - 23 mg/dL 24  31  27    Creatinine 0.61 - 1.24 mg/dL 8.76  8.86  8.97   Sodium 135 - 145 mmol/L 136  133  137   Potassium 3.5 - 5.1 mmol/L 4.7  4.3  4.1   Chloride 98 - 111 mmol/L 103  102  105   CO2 22 - 32 mmol/L 28  26  26    Calcium  8.9 - 10.3 mg/dL 9.2  9.1  9.1   Total Protein 6.5 - 8.1 g/dL 6.9  7.6  6.6   Total Bilirubin 0.0 - 1.2 mg/dL 0.5  0.4  0.4   Alkaline Phos 38 - 126 U/L 52  53  42   AST 15 - 41 U/L 25  18  16    ALT 0 - 44 U/L 30  19  14        RADIOGRAPHIC STUDIES: I have personally reviewed the radiological images as listed and agreed with the findings in the report. No results found.    Orders Placed This Encounter  Procedures   NM PET Image Restage (PS) Skull Base to Thigh (F-18 FDG)    Standing Status:   Future    Expected Date:   02/10/2024  Expiration Date:   12/22/2024    If indicated for the ordered procedure, I authorize the administration of a radiopharmaceutical per Radiology protocol:   Yes    Preferred imaging location?:   [facility]   All questions were answered. The patient knows to call the clinic with any problems, questions or concerns. No barriers to learning was detected. The total time spent in the appointment was 25 minutes, including review of chart and various tests results, discussions about plan of care and coordination of care plan     Onita Mattock, MD 12/23/2023

## 2023-12-23 NOTE — Patient Instructions (Signed)
 [facility] - A DEPT OF [facility]  Discharge Instructions: Thank you for choosing [facility] to provide your oncology and hematology care.   If you have a lab appointment with the Cancer Center, please go directly to the Cancer Center and check in at the registration area.   Wear comfortable clothing and clothing appropriate for easy access to any Portacath or PICC line.   We strive to give you quality time with your provider. You may need to reschedule your appointment if you arrive late (15 or more minutes).  Arriving late affects you and other patients whose appointments are after yours.  Also, if you miss three or more appointments without notifying the office, you may be dismissed from the clinic at the provider's discretion.      For prescription refill requests, have your pharmacy contact our office and allow 72 hours for refills to be completed.    Today you received the following chemotherapy and/or immunotherapy agents: Opdivo    To help prevent nausea and vomiting after your treatment, we encourage you to take your nausea medication as directed.  BELOW ARE SYMPTOMS THAT SHOULD BE REPORTED IMMEDIATELY: *FEVER GREATER THAN 100.4 F (38 C) OR HIGHER *CHILLS OR SWEATING *NAUSEA AND VOMITING THAT IS NOT CONTROLLED WITH YOUR NAUSEA MEDICATION *UNUSUAL SHORTNESS OF BREATH *UNUSUAL BRUISING OR BLEEDING *URINARY PROBLEMS (pain or burning when urinating, or frequent urination) *BOWEL PROBLEMS (unusual diarrhea, constipation, pain near the anus) TENDERNESS IN MOUTH AND THROAT WITH OR WITHOUT PRESENCE OF ULCERS (sore throat, sores in mouth, or a toothache) UNUSUAL RASH, SWELLING OR PAIN  UNUSUAL VAGINAL DISCHARGE OR ITCHING   Items with * indicate a potential emergency and should be followed up as soon as possible or go to the Emergency Department if any problems should occur.  Please show the CHEMOTHERAPY ALERT CARD or IMMUNOTHERAPY ALERT  CARD at check-in to the Emergency Department and triage nurse.  Should you have questions after your visit or need to cancel or reschedule your appointment, please contact [facility] - A DEPT OF [facility]  Dept: [phone]  and follow the prompts.  Office hours are 8:00 a.m. to 4:30 p.m. Monday - Friday. Please note that voicemails left after 4:00 p.m. may not be returned until the following business day.  We are closed weekends and major holidays. You have access to a nurse at all times for urgent questions. Please call the main number to the clinic Dept: [phone] and follow the prompts.   For any non-urgent questions, you may also contact your provider using MyChart. We now offer e-Visits for anyone 24 and older to request care online for non-urgent symptoms. For details visit [email or web address].   Also download the MyChart app! Go to the app store, search MyChart, open the app, select [facility], and log in with your MyChart username and password.

## 2023-12-23 NOTE — Assessment & Plan Note (Addendum)
 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  S/p FLOT4 01/02/2021 - 03/22/2021 -Posttreatment EUS 05/2021 and 06/29/2022 by Dr. Rollin showed a scar and residual erythema in stomach, biopsy negative for residual malignancy.  -Jon Williams is clinically doing well on surveillance. Last CT 03/20/22 which shows no evidence of recurrent or metastatic disease, and stable bilateral adrenal adenomas -Surveillance CT scan from October 09, 2022 showed no evidence of recurrence -Surveillance CT scan from April 30, 2023 showed no evidence of residual or recurrent malignancy.  Small liver lesion is stable, favored to be benign. -He unfortunately had local recurrence in 09/2023 -he started Nivo 240mg  q2w and Ipi 1mg /kg q6w (for 2 doses only) on 10/28/2023

## 2023-12-24 ENCOUNTER — Other Ambulatory Visit (HOSPITAL_BASED_OUTPATIENT_CLINIC_OR_DEPARTMENT_OTHER): Payer: Self-pay

## 2023-12-25 ENCOUNTER — Ambulatory Visit: Admitting: Hematology

## 2023-12-25 ENCOUNTER — Encounter

## 2023-12-30 ENCOUNTER — Ambulatory Visit: Admitting: Hematology

## 2023-12-30 ENCOUNTER — Inpatient Hospital Stay

## 2023-12-30 ENCOUNTER — Ambulatory Visit

## 2023-12-30 NOTE — Progress Notes (Signed)
 Nutrition Follow-up:   Patient with recurrent gastric cancer. Followed by Dr Lanny.  Receiving nivolumab , ipilimumab   Met with patient and wife following PICC line change.  Reports that his appetite is good. Trying to eat and maintain weight.  Says that he went through 12 weeks of classes with [facility] about 1-2 years ago and learned everything about nutrition.  Trying to eat fruits, vegetables, lean meats. Limited salt and sugar.  Drinking ensure shakes 2-3 times a day. Drinking Fairlife milk.  Doing exercises and walking daily to improve strength.      Medications: reviewed  Labs: reviewed  Anthropometrics:   Weight 162 lb on RD scale 157 lb 12.8 oz on 10/27 155 lb on 9/2 173 lb on 05/14/23    NUTRITION DIAGNOSIS: Unintentional weight loss improved    INTERVENTION:  Continue eating well balanced diet including foods rich in protein Continue oral nutrition supplements Contact information provided and patient prefers to call RD if needed in the future     NEXT VISIT: no follow-up per patient request RD available if needed  Synthia Fairbank B. Dasie SOLON, CSO, LDN Registered Dietitian [phone]

## 2024-01-02 ENCOUNTER — Ambulatory Visit (INDEPENDENT_AMBULATORY_CARE_PROVIDER_SITE_OTHER): Admitting: Physician Assistant

## 2024-01-02 ENCOUNTER — Encounter (INDEPENDENT_AMBULATORY_CARE_PROVIDER_SITE_OTHER): Payer: Self-pay | Admitting: Physician Assistant

## 2024-01-02 ENCOUNTER — Other Ambulatory Visit (HOSPITAL_COMMUNITY): Payer: Self-pay

## 2024-01-02 VITALS — BP 107/67 | HR 63 | Temp 97.5°F | Ht 66.0 in | Wt 159.2 lb

## 2024-01-02 DIAGNOSIS — H6122 Impacted cerumen, left ear: Secondary | ICD-10-CM

## 2024-01-02 NOTE — Progress Notes (Signed)
 ADVANCED HF CLINIC NOTE   Primary Care: [name], MD Primary Cardiologist: Aleene Passe, MD (Inactive) HF Cardiologist: Dr. Rolan  Reason for Visit: f/u for systolic heart failure and systolic heart failure   HPI: Mr Furry is a 73 y.o. with a history of chronic systolic heart failure, St Jude ICD, permanent atrial fibrillation, CAD, DMII, LBBB, HTN, gastric cancer 2022, and prostate cancer 2019.    Cath 2018 showed mild to moderate non-obstructive CAD. Fick CI 1.9.    Echo 10/22 showed EF 20-25%, mod-severe MR, mod TR, RV nl   Echo 2/23 showed EF 20-25%, mild MR, RV not well visualized   Admitted 8/23 with HF. Diuresed with IV lasix  and GDMT adjusted. Echo showed EF 20-25%, RV mildly reduced, LA severely dilated, moderate-severe MR.    Had follow up with Dr Lanny 09/2022. CT scan no evidence of recurrent cancer. Plan for repeat CT in 8 months.    Admitted 9/24 with CHF. Echo showed EF 10-15%, G3DD, RV mildly reduced. R/LHC showed nonobstructive CAD, normal filling pressures, and low cardiac index. Unable to get cMRI as device not compatible. GDMT titrated and he was discharged home, weight 183 lbs.  Echo 12/24 EF 20% with severe LV dilation, normal RV, severe LAE, moderate MR, IVC normal.   RHC 6/25 showing normal filling pressures but low cardiac output.   Seen in the ER in 6/25 with angioedema, followed up with allergist and was taken off Entresto .   Zio monitor to quantify PVCs showed monitor 1: 152 VT runs, 100% AF burden, 27.6% PVC burden; monitor 2 showed 49 VT runs, 100% AF burden, 23.2 % PVC burden.  Follow up with EP 7/25, planning for BiV upgrade.  Seen in ED 08/30/23 with abd pain. CT chest and abd re-assuring. He was discharged with GI follow up. Saw Dr. Rollin and treating for reflux.  He has continued to struggle w/ GI symptoms, including postprandial pain and wt loss. Went back to the ED 8/25. ED course/hospital work unfortunately showed malignant appearing  mass in the lesser curvature of the stomach on abdominal CT. Underwent EGD w/ biopsy. Pathology c/w adenocarcinoma. This has just resulted.    09/2023 Recurrent gastric cancer. Started on immunotherapy. Plan for repeat PET in December.   Today he returns for HF follow up with his wife. Overall feeling fine. Denies SOB/PND/Orthopnea. Appetite ok. No fever or chills. Weight at home 159.2  pounds. Taking all medications.    St Jude device interrogation:  Impedance up. A fib burden > 99%  Labs (9/24): K 3.7, creatinine 1.13, Lp(a) 184 Labs (10/24): K 4.3, creatinine 1.27, hgb 13.6 Labs (1/25): 4.1, creatinine 1.26 Labs (2/25): K 4, creatinine 1.21, hgb 11.2, LFTs normal Labs (6/25): K 4.4, creatinine 1.4 Labs (7/25): K 4.0, creatinine 1.09   PMH: 1. H/o gastric cancer, w/ recurrence (8/25, path c/w adenocarcinoma)  2. H/o prostate cancer.  3. Type 2 DM 4. Atrial fibrillation: Permanent 5. Chronic systolic CHF: Nonischemic cardiomyopathy. St Jude ICD, unable to find adequate coronary vein for LV lead.   - R/LHC (2018): Mild to moderate non obstructive CAD; RA 8, PA 42/20 (29), PCWP 17, CO/CI (Fick) 3.76/ 1.9  - Echo (10/22): EF 20-25%, mod-severe MR, mod TR, RV normal - Echo (2/23): EF 20-25%, mild MR, RV not well visualized - Echo (8/24): EF 20-25%, RV mildly reduced, LA severely dilated, moderate to severe MR - R/LHC (9/24): nonobstructive CAD; RA 1, PA 28/10 (17), PCWP 8, Fick CI 2.16, thermo CI  1.84.  - Echo (9/24): EF 10-15%, severe LV dilation, mild RV dysfunction with moderate RV enlargement, moderate-severe MR.  - Echo (12/24): EF 20% with severe LV dilation, normal RV, severe LAE, moderate MR, IVC normal.  - RHC (6/25): mean RA 4, PA 29/12 mean 19, mean PCWP 11, CI 2.2 Fick/1.87 thermo, PAPi 4.25 - Suspect Entresto -related angioedema.  6. Fe deficiency anemia 7. Spinal stenosis  FH: mother CVA, MI  Review of Systems: All systems reviewed and negative except as per HPI.    Current Outpatient Medications  Medication Sig Dispense Refill   Accu-Chek Softclix Lancets lancets Use to check your blood sugar finger stick once a day Dx Code E11.9 90 days 100 each 3   amiodarone  (PACERONE ) 200 MG tablet Take 1 tablet (200 mg total) by mouth 2 (two) times daily for 14 days, THEN 1 tablet (200 mg total) daily. 180 tablet 3   atorvastatin  (LIPITOR) 40 MG tablet Take 1 tablet (40 mg total) by mouth daily. 90 tablet 3   Biotin 1000 MCG tablet Take 1,000 mcg by mouth daily with breakfast.     Blood Glucose Monitoring Suppl (ACCU-CHEK GUIDE) w/Device KIT as directed to check blood sugar once a day 1 kit 0   cetirizine  (ZYRTEC ) 10 MG tablet Take 1 tablet (10 mg total) by mouth daily. 30 tablet 0   cholecalciferol  (VITAMIN D3) 25 MCG (1000 UT) tablet Take 1,000 Units by mouth daily with breakfast.     digoxin  (LANOXIN ) 0.125 MG tablet Take 1/2 tablet (0.0625mg  total) by mouth daily. 15 tablet 6   empagliflozin  (JARDIANCE ) 10 MG TABS tablet Take 1 tablet (10 mg total) by mouth daily. 90 tablet 3   EPINEPHrine  0.3 mg/0.3 mL IJ SOAJ injection Inject 0.3 mg into the muscle as needed for anaphylaxis. 2 each 1   ferrous sulfate 325 (65 FE) MG tablet Take 325 mg by mouth daily with breakfast.     furosemide  (LASIX ) 40 MG tablet Take 1 tablet (40 mg total) by mouth every other day. Alternating with 20 mg. 30 tablet 6   gabapentin  (NEURONTIN ) 100 MG capsule Take 1 capsule (100 mg total) by mouth 2 (two) times daily. 180 capsule 1   glucose blood (ACCU-CHEK GUIDE TEST) test strip Use to check blood sugars once daily. 100 each 3   ipratropium (ATROVENT ) 0.03 % nasal spray Place 1-2 sprays into both nostrils 2 (two) times daily as needed (nasal drainage). 30 mL 2   lidocaine -prilocaine  (EMLA ) cream Apply 1 Application topically as needed. 30 g 0   metFORMIN  (GLUCOPHAGE -XR) 500 MG 24 hr tablet Take 1 tablet (500 mg total) by mouth daily with evening meal. 90 tablet 3   metoprolol  succinate  (TOPROL -XL) 25 MG 24 hr tablet Take 1 tablet (25 mg total) by mouth daily. 90 tablet 3   multivitamin (ONE-A-DAY MEN'S) TABS tablet Take 1 tablet by mouth daily with breakfast.     ondansetron  (ZOFRAN ) 8 MG tablet Take 1 tablet (8 mg total) by mouth every 8 (eight) hours as needed for nausea or vomiting. 30 tablet 1   pantoprazole  (PROTONIX ) 40 MG tablet Take 1 tablet (40 mg total) by mouth 2 (two) times daily. 180 tablet 3   potassium chloride  (KLOR-CON ) 10 MEQ tablet Take 2 tablets (20 mEq total) by mouth daily. 60 tablet 3   prochlorperazine  (COMPAZINE ) 10 MG tablet Take 1 tablet (10 mg total) by mouth every 6 (six) hours as needed for nausea or vomiting. 30 tablet 1   rivaroxaban  (  XARELTO ) 20 MG TABS tablet Take 1 tablet (20 mg total) by mouth daily with supper. 90 tablet 3   spironolactone  (ALDACTONE ) 25 MG tablet Take 0.5 tablets (12.5 mg total) by mouth daily.     traMADol  (ULTRAM ) 50 MG tablet Take 1 tablet (50 mg total) by mouth every 6 (six) hours as needed. 60 tablet 0   valsartan  (DIOVAN ) 40 MG tablet Take 0.5 tablets (20 mg total) by mouth 2 (two) times daily.     hydrOXYzine  (ATARAX ) 25 MG tablet Take 1/2 tablet (12.5 mg total) by mouth every 8 (eight) hours as needed for up to 6 doses for itching. (Patient not taking: Reported on 01/03/2024) 3 tablet 0   lidocaine -prilocaine  (EMLA ) cream Apply to affected area once 30 g 3   pantoprazole  (PROTONIX ) 40 MG tablet Take 1 tablet (40 mg total) by mouth daily. 30 tablet 1   sucralfate  (CARAFATE ) 1 GM/10ML suspension Take 10 mLs (1 g total) by mouth 4 (four) times daily -  with meals and at bedtime. 420 mL 1   valsartan  (DIOVAN ) 40 MG tablet Take 1 tablet (40 mg total) by mouth 2 (two) times daily. 60 tablet 11   No current facility-administered medications for this encounter.   Allergies  Allergen Reactions   Aspirin  Anaphylaxis and Hives   Sacubitril -Valsartan  Swelling    lip swelling, angioedema   Sulfa Antibiotics Anaphylaxis,  Hives, Swelling and Other (See Comments)    Swollen lips   Ace Inhibitors     Possible angioedema   Social History   Socioeconomic History   Marital status: Married    Spouse name: Not on file   Number of children: 2   Years of education: Not on file   Highest education level: Not on file  Occupational History   Occupation: retired  Tobacco Use   Smoking status: Never    Passive exposure: Never   Smokeless tobacco: Never  Vaping Use   Vaping status: Never Used  Substance and Sexual Activity   Alcohol use: No   Drug use: No   Sexual activity: Not Currently  Other Topics Concern   Not on file  Social History Narrative   Retired location manager. Married to Mrs. Carollee LOISE Budge. Daughter, Marko, lives in [place] . Son, Stark, lives in [place] .   Social Drivers of Corporate Investment Banker Strain: Low Risk  (12/26/2021)   Overall Financial Resource Strain (CARDIA)    Difficulty of Paying Living Expenses: Not very hard  Food Insecurity: No Food Insecurity (09/27/2023)   Hunger Vital Sign    Worried About Running Out of Food in the Last Year: Never true    Ran Out of Food in the Last Year: Never true  Transportation Needs: No Transportation Needs (09/27/2023)   PRAPARE - Administrator, Civil Service (Medical): No    Lack of Transportation (Non-Medical): No  Physical Activity: Insufficiently Active (09/26/2021)   Exercise Vital Sign    Days of Exercise per Week: 7 days    Minutes of Exercise per Session: 10 min  Stress: No Stress Concern Present (09/26/2021)   [employer] of Occupational Health - Occupational Stress Questionnaire    Feeling of Stress : Not at all  Social Connections: Unknown (09/28/2023)   Social Connection and Isolation Panel    Frequency of Communication with Friends and Family: Patient declined    Frequency of Social Gatherings with Friends and Family: Patient declined    Attends Religious Services: Not on file  Active Member of  Clubs or Organizations: Not on file    Attends Club or Organization Meetings: Patient declined    Marital Status: Patient declined  Intimate Partner Violence: Not At Risk (09/28/2023)   Humiliation, Afraid, Rape, and Kick questionnaire    Fear of Current or Ex-Partner: No    Emotionally Abused: No    Physically Abused: No    Sexually Abused: No   BP 90/60   Pulse 76   Wt 68 kg (150 lb)   SpO2 91%   BMI 24.21 kg/m   Wt Readings from Last 3 Encounters:  01/03/24 68 kg (150 lb)  01/02/24 72.2 kg (159 lb 3.2 oz)  12/23/23 71.2 kg (157 lb)   Physical Exam  Vitals:   01/03/24 1446  BP: 90/60  Pulse: 76  SpO2: 91%  General:   No resp difficulty. Walked in the clinic  Neck: no JVD.  Cor: Regular rate & rhythm.  Lungs: clear Abdomen: soft, nontender, nondistended.  Extremities: no  edema Neuro: alert & oriented x3   ASSESSMENT & PLAN: 1. Chronic systolic CHF: Long-standing cardiomyopathy, nonischemic.  St Jude ICD, was unable to find adequate vein for LV lead. Device is not MRI compatible. Cath in 2018 with nonobstructive CAD but CI at that time was low at 1.9.  Echo in 8/23 showed EF 20-25%, mild RV dysfunction, moderate-severe MR.  Admitted 9/24 with CHF. Echo showed EF 10-15%, G3DD, RV mildly reduced. R/LHC showed non-obstructive CAD, normal filling pressures and low CI (2.16 Fick/1.84 thermo). Echo 12/24 showed EF 20% with severe LV dilation, normal RV, severe LAE, moderate MR, IVC normal. Repeat RHC in 6/25 again showed low CI (2.2 F/1.87 T) with normal filling pressures. He had to stop Entresto  with angioedema. Now on Valsartan .  - Based on low output on prior RHCs and markedly low EF, VAD was previously discussed but he has recurrent gastric cancer.   - Patient is RV pacing 89% of the time and also has a LBBB around 150 msec at baseline as well as frequent PVCs. Unable to place CS lead in the past. As above, he has seen Dr Waddell and planning BiV upgrade but with recurrent cancer  this is on hold.   NYHA II. Volume status stable. Continue lasix  40 mg one day then 20 mg the next. I reviewed BMET from 12/2023.  - Continue digoxin  0.0625 mg daily. - Stop valsartan  due to hypotension and reported lip swelling. Does not appear swollen to me. His wife is concerned about his lip swelling she has noticed.  (off Entresto  with angioedema, not a candidate for ACEi or Entresto ). - Continue spironolactone  12.5 mg daily  - Continue Jardiance  10 mg daily - Continue Toprol  XL 25 mg daily. 2. Atrial fibrillation: Permanent and rate controlled  - continue amio 200 mg daily + toprol  XL .  - Continue Xarelto   3. Gastric cancer: Initial occurrence in 2022, Treated. Now w/ recurrence as of 8/25. EGD w/ biopsy 8/25 c/w adenocarcinoma.  On immunotherapy.  Follows with Dr. Lanny 4. Type 2 diabetes: followed by PCP  - Continue Jardiance . 5. OSA: Suspected. He declines sleep study. 6. PVCs: Zio showed high PVC burden.  - started on amiodarone  7/25 for suppression, will continue  - continue current dose of Toprol  XL.  7. Hypotension As noted above stopping valsartan .   Follow up in 2 months  with an ECHO and Dr Rolan.    Mecca Guitron NP-C 01/03/24

## 2024-01-03 ENCOUNTER — Ambulatory Visit (HOSPITAL_COMMUNITY)
Admission: RE | Admit: 2024-01-03 | Discharge: 2024-01-03 | Disposition: A | Discharge status: Home / Self Care | Source: Ambulatory Visit | Attending: Adult Health | Admitting: Adult Health

## 2024-01-03 ENCOUNTER — Other Ambulatory Visit (HOSPITAL_COMMUNITY): Payer: Self-pay

## 2024-01-03 ENCOUNTER — Other Ambulatory Visit: Payer: Self-pay

## 2024-01-03 VITALS — BP 90/60 | HR 76 | Wt 150.0 lb

## 2024-01-03 DIAGNOSIS — I959 Hypotension, unspecified: Secondary | ICD-10-CM | POA: Diagnosis not present

## 2024-01-03 DIAGNOSIS — Z8546 Personal history of malignant neoplasm of prostate: Secondary | ICD-10-CM | POA: Insufficient documentation

## 2024-01-03 DIAGNOSIS — I493 Ventricular premature depolarization: Secondary | ICD-10-CM | POA: Insufficient documentation

## 2024-01-03 DIAGNOSIS — I110 Hypertensive heart disease with heart failure: Secondary | ICD-10-CM | POA: Diagnosis not present

## 2024-01-03 DIAGNOSIS — Z79899 Other long term (current) drug therapy: Secondary | ICD-10-CM | POA: Insufficient documentation

## 2024-01-03 DIAGNOSIS — Z7984 Long term (current) use of oral hypoglycemic drugs: Secondary | ICD-10-CM | POA: Insufficient documentation

## 2024-01-03 DIAGNOSIS — I2510 Atherosclerotic heart disease of native coronary artery without angina pectoris: Secondary | ICD-10-CM | POA: Insufficient documentation

## 2024-01-03 DIAGNOSIS — Z7901 Long term (current) use of anticoagulants: Secondary | ICD-10-CM | POA: Insufficient documentation

## 2024-01-03 DIAGNOSIS — I428 Other cardiomyopathies: Secondary | ICD-10-CM

## 2024-01-03 DIAGNOSIS — I340 Nonrheumatic mitral (valve) insufficiency: Secondary | ICD-10-CM | POA: Insufficient documentation

## 2024-01-03 DIAGNOSIS — E119 Type 2 diabetes mellitus without complications: Secondary | ICD-10-CM | POA: Insufficient documentation

## 2024-01-03 DIAGNOSIS — I4821 Permanent atrial fibrillation: Secondary | ICD-10-CM | POA: Insufficient documentation

## 2024-01-03 DIAGNOSIS — C169 Malignant neoplasm of stomach, unspecified: Secondary | ICD-10-CM | POA: Insufficient documentation

## 2024-01-03 DIAGNOSIS — I952 Hypotension due to drugs: Secondary | ICD-10-CM | POA: Diagnosis not present

## 2024-01-03 DIAGNOSIS — I5022 Chronic systolic (congestive) heart failure: Secondary | ICD-10-CM | POA: Diagnosis not present

## 2024-01-03 MED ORDER — AMIODARONE HCL 200 MG PO TABS
200.0000 mg | ORAL_TABLET | Freq: Every day | ORAL | 3 refills | Status: AC
  Filled 2024-01-03 – 2024-02-19 (×9): qty 90, 90d supply, fill #0

## 2024-01-03 NOTE — Patient Instructions (Signed)
 Medication Changes:  STOP Valsartan   CHANGE Amiodarone  to 200 mg Daily  Testing/Procedures:  Your physician has requested that you have an echocardiogram. Echocardiography is a painless test that uses sound waves to create images of your heart. It provides your doctor with information about the size and shape of your heart and how well your heart's chambers and valves are working. This procedure takes approximately one hour. There are no restrictions for this procedure. Please do NOT wear cologne, perfume, aftershave, or lotions (deodorant is allowed). Please arrive 15 minutes prior to your appointment time. IN 2 MONTHS  Please note: We ask at that you not bring children with you during ultrasound (echo/ vascular) testing. Due to room size and safety concerns, children are not allowed in the ultrasound rooms during exams. Our front office staff cannot provide observation of children in our lobby area while testing is being conducted. An adult accompanying a patient to their appointment will only be allowed in the ultrasound room at the discretion of the ultrasound technician under special circumstances. We apologize for any inconvenience.  Special Instructions // Education:  Do the following things EVERYDAY: Weigh yourself in the morning before breakfast. Write it down and keep it in a log. Take your medicines as prescribed Eat low salt foods--Limit salt (sodium) to 2000 mg per day.  Stay as active as you can everyday Limit all fluids for the day to less than 2 liters   Follow-Up in: 2 months with an echocardiogram (Jan/Feb 2026), **PLEASE CALL OUR OFFICE IN DECEMBER TO SCHEDULE THESE APPOINTMENTS   At the [facility], you and your health needs are our priority. We have a designated team specialized in the treatment of Heart Failure. This Care Team includes your primary Heart Failure Specialized Cardiologist (physician), Advanced Practice Providers (APPs- Physician  Assistants and Nurse Practitioners), and Pharmacist who all work together to provide you with the care you need, when you need it.   You may see any of the following providers on your designated Care Team at your next follow up:  Dr. Toribio Fuel Dr. Ezra Shuck Dr. Odis Brownie Greig Mosses, NP Caffie Shed, [place] [facility] [place], [place] Beckey Coe, NP Jordan Lee, NP Tinnie Redman, PharmD   Please be sure to bring in all your medications bottles to every appointment.   Need to Contact Us :  If you have any questions or concerns before your next appointment please send us  a message through [facility] or call our office at [phone].    TO LEAVE A MESSAGE FOR THE NURSE SELECT OPTION 2, PLEASE LEAVE A MESSAGE INCLUDING: YOUR NAME DATE OF BIRTH CALL BACK NUMBER REASON FOR CALL**this is important as we prioritize the call backs  YOU WILL RECEIVE A CALL BACK THE SAME DAY AS LONG AS YOU CALL BEFORE 4:00 PM

## 2024-01-03 NOTE — Progress Notes (Signed)
 Dear Dr. Stacia, Here is my assessment for our mutual patient, Jon Williams. Thank you for allowing me the opportunity to care for your patient. Please do not hesitate to contact me should you have any other questions. Sincerely, Chyrl Cohen PA-C  Otolaryngology Clinic Note Referring provider: Dr. Stacia HPI:  Jon Williams is a 73 y.o. male kindly referred by Dr. Stacia   Discussed the use of AI scribe software for clinical note transcription with the patient, who gave verbal consent to proceed.  History of Present Illness   Jon Williams is a 73 year old male who presents with earwax buildup causing echoing in the left ear. He was referred by Dr. Luciano for further evaluation and management of earwax buildup.  He experiences an echoing sensation in his left ear, which prompted him to seek medical attention. Initially, he visited Dr. Lexus, who attempted to lavage both ears with water. However, the wax in the left ear was difficult to remove as it was adhered to the ear canal wall.  After the initial lavage, he noticed some improvement in his hearing and the echoing sensation, but it was not completely resolved. He describes the sensation as 'sounding like an echo' when he talks. He has not been using any ear drops or other treatments for earwax removal since the lavage. He prefers having the wax manually removed rather than using drops.        Independent Review of Additional Tests or Records:  none   PMH/Meds/All/SocHx/FamHx/ROS:   Past Medical History:  Diagnosis Date   AICD (automatic cardioverter/defibrillator) present 05/25/2016   biv icd   Angio-edema    Bunion, right    Chronic combined systolic and diastolic CHF (congestive heart failure) (HCC)    Echo 1/18: Mild conc LVH, EF 15-20, severe diff HK, inf and inf-septal AK, Gr 3 DD, mild to mod MR, severe LAE, mod reduced RVSF, mod RAE, mild TR, PASP 50   Coronary artery disease involving native coronary  artery without angina pectoris 04/17/2016   LHC 1/18: pLCx 30, mLCx 20, mRCA 40, dRCA 20, LVEDP 23, mean RA 8, PA 42/20, PCWP 17   Diabetes mellitus without complication (HCC)    Gastric cancer (HCC)    History of atrial fibrillation    History of atrial flutter    History of cardiomegaly 06/07/2016   Noted on CXR   History of colon polyps 06/28/2017   Noted on colonoscopy   Hypertension    LBBB (left bundle branch block)    Nausea vomiting and diarrhea 04/04/2021   NICM (nonischemic cardiomyopathy) (HCC)    Echo 1/18:  Mild conc LVH, EF 15-20, severe diff HK, inf and inf-septal AK, Gr 3 DD, mild to mod MR, severe LAE, mod reduced RVSF, mod RAE, mild TR, PASP 50   Other secondary pulmonary hypertension (HCC) 04/17/2016   Prostate cancer (HCC) 2019   Sigmoid diverticulosis 06/28/2017   Noted on colonoscopy     Past Surgical History:  Procedure Laterality Date   BIOPSY  12/01/2020   Procedure: BIOPSY;  Surgeon: Rollin Dover, MD;  Location: [facility] ENDOSCOPY;  Service: Endoscopy;;   BIOPSY  06/09/2021   Procedure: BIOPSY;  Surgeon: Rollin Dover, MD;  Location: [facility];  Service: Gastroenterology;;   BIOPSY  06/29/2022   Procedure: BIOPSY;  Surgeon: Rollin Dover, MD;  Location: [facility] ENDOSCOPY;  Service: Gastroenterology;;   BIV ICD INSERTION CRT-D N/A 05/25/2016   Procedure: BiV ICD Insertion CRT-D;  Surgeon: Danelle LELON Birmingham, MD;  Location: [facility] INVASIVE CV LAB;  Service: Cardiovascular;  Laterality: N/A;   CARDIAC CATHETERIZATION N/A 03/02/2016   Procedure: Right/Left Heart Cath and Coronary Angiography;  Surgeon: Lonni Hanson, MD;  Location: [facility] INVASIVE CV LAB;  Service: Cardiovascular;  Laterality: N/A;   CARDIOVERSION N/A 07/17/2016   Procedure: Cardioversion;  Surgeon: Waddell Danelle ORN, MD;  Location: [facility] INVASIVE CV LAB;  Service: Cardiovascular;  Laterality: N/A;   COLONOSCOPY WITH PROPOFOL  N/A 06/28/2017   Procedure: COLONOSCOPY WITH PROPOFOL ;  Surgeon: Rollin Dover, MD;  Location:  [facility] ENDOSCOPY;  Service: Endoscopy;  Laterality: N/A;   colonscopy  2009   ESOPHAGOGASTRODUODENOSCOPY N/A 12/01/2020   Procedure: ESOPHAGOGASTRODUODENOSCOPY (EGD);  Surgeon: Rollin Dover, MD;  Location: [facility] ENDOSCOPY;  Service: Endoscopy;  Laterality: N/A;  IDA/guaiac positive stools   ESOPHAGOGASTRODUODENOSCOPY N/A 10/04/2023   Procedure: EGD (ESOPHAGOGASTRODUODENOSCOPY);  Surgeon: Rollin Dover, MD;  Location: [facility];  Service: Gastroenterology;  Laterality: N/A;   ESOPHAGOGASTRODUODENOSCOPY (EGD) WITH PROPOFOL  N/A 12/16/2020   Procedure: ESOPHAGOGASTRODUODENOSCOPY (EGD) WITH PROPOFOL ;  Surgeon: Rollin Dover, MD;  Location: [facility] ENDOSCOPY;  Service: Endoscopy;  Laterality: N/A;   ESOPHAGOGASTRODUODENOSCOPY (EGD) WITH PROPOFOL  N/A 06/09/2021   Procedure: ESOPHAGOGASTRODUODENOSCOPY (EGD) WITH PROPOFOL ;  Surgeon: Rollin Dover, MD;  Location: [facility] ENDOSCOPY;  Service: Gastroenterology;  Laterality: N/A;   ESOPHAGOGASTRODUODENOSCOPY (EGD) WITH PROPOFOL  N/A 06/29/2022   Procedure: ESOPHAGOGASTRODUODENOSCOPY (EGD) WITH PROPOFOL ;  Surgeon: Rollin Dover, MD;  Location: [facility] ENDOSCOPY;  Service: Gastroenterology;  Laterality: N/A;   GOLD SEED IMPLANT N/A 12/24/2017   Procedure: GOLD SEED IMPLANT, TRANSERINEAL;  Surgeon: Nieves Cough, MD;  Location: [facility] ORS;  Service: Urology;  Laterality: N/A;   INSERT / REPLACE / REMOVE PACEMAKER     IR ANGIOGRAM SELECTIVE EACH ADDITIONAL VESSEL  11/04/2023   IR ANGIOGRAM SELECTIVE EACH ADDITIONAL VESSEL  11/04/2023   IR CV LINE INJECTION  10/25/2023   IR REMOVAL TUN ACCESS W/ PORT W/O FL MOD SED  11/04/2023   IR US  GUIDE VASC ACCESS RIGHT  11/04/2023   IR VENOCAVAGRAM SVC  11/04/2023   LEAD REVISION  10/10/2018   LEAD REVISION/REPAIR N/A 10/10/2018   Procedure: LEAD REVISION/REPAIR;  Surgeon: Waddell Danelle ORN, MD;  Location: [facility] INVASIVE CV LAB;  Service: Cardiovascular;  Laterality: N/A;   POLYPECTOMY  06/28/2017   Procedure: POLYPECTOMY;  Surgeon: Rollin Dover, MD;   Location: [facility] ENDOSCOPY;  Service: Endoscopy;;  ascending and descending colon polyp   PORTACATH PLACEMENT Right 12/23/2020   Procedure: INSERTION PORT-A-CATH;  Surgeon: Dasie Leonor CROME, MD;  Location: [facility] OR;  Service: General;  Laterality: Right;   PROSTATE BIOPSY  02/20/2017   RIGHT HEART CATH N/A 07/25/2023   Procedure: RIGHT HEART CATH;  Surgeon: Rolan Ezra RAMAN, MD;  Location: [facility] INVASIVE CV LAB;  Service: Cardiovascular;  Laterality: N/A;   RIGHT/LEFT HEART CATH AND CORONARY ANGIOGRAPHY N/A 10/30/2022   Procedure: RIGHT/LEFT HEART CATH AND CORONARY ANGIOGRAPHY;  Surgeon: Rolan Ezra RAMAN, MD;  Location: [facility] INVASIVE CV LAB;  Service: Cardiovascular;  Laterality: N/A;   SPACE OAR INSTILLATION N/A 12/24/2017   Procedure: SPACE OAR INSTILLATION;  Surgeon: Nieves Cough, MD;  Location: [facility] ORS;  Service: Urology;  Laterality: N/A;   TOTAL KNEE ARTHROPLASTY Right 08/16/2017   Procedure: RIGHT TOTAL KNEE ARTHROPLASTY;  Surgeon: Shari Sieving, MD;  Location: [facility] OR;  Service: Orthopedics;  Laterality: Right;   UPPER ESOPHAGEAL ENDOSCOPIC ULTRASOUND (EUS) N/A 12/16/2020   Procedure: UPPER ESOPHAGEAL ENDOSCOPIC ULTRASOUND (EUS);  Surgeon: Rollin Dover, MD;  Location: [facility];  Service: Endoscopy;  Laterality: N/A;  Family History  Problem Relation Age of Onset   Hypertension Mother    Heart disease Mother    Diabetes Mother    Diabetes Father    Hypertension Father    Cancer Father        lung cancer   Healthy Sister    Heart attack Brother    Heart disease Brother 26       + tobacco   Healthy Brother      Social Connections: Unknown (09/28/2023)   Social Connection and Isolation Panel    Frequency of Communication with Friends and Family: Patient declined    Frequency of Social Gatherings with Friends and Family: Patient declined    Attends Religious Services: Not on Marketing Executive or Organizations: Not on file    Attends Banker Meetings: Patient  declined    Marital Status: Patient declined      Current Outpatient Medications:    Accu-Chek Softclix Lancets lancets, Use to check your blood sugar finger stick once a day Dx Code E11.9 90 days, Disp: 100 each, Rfl: 3   amiodarone  (PACERONE ) 200 MG tablet, Take 1 tablet (200 mg total) by mouth 2 (two) times daily for 14 days, THEN 1 tablet (200 mg total) daily., Disp: 180 tablet, Rfl: 3   atorvastatin  (LIPITOR) 40 MG tablet, Take 1 tablet (40 mg total) by mouth daily., Disp: 90 tablet, Rfl: 3   Biotin 1000 MCG tablet, Take 1,000 mcg by mouth daily with breakfast., Disp: , Rfl:    Blood Glucose Monitoring Suppl (ACCU-CHEK GUIDE) w/Device KIT, as directed to check blood sugar once a day, Disp: 1 kit, Rfl: 0   cetirizine  (ZYRTEC ) 10 MG tablet, Take 1 tablet (10 mg total) by mouth daily., Disp: 30 tablet, Rfl: 0   cholecalciferol  (VITAMIN D3) 25 MCG (1000 UT) tablet, Take 1,000 Units by mouth daily with breakfast., Disp: , Rfl:    digoxin  (LANOXIN ) 0.125 MG tablet, Take 1/2 tablet (0.0625mg  total) by mouth daily., Disp: 15 tablet, Rfl: 6   empagliflozin  (JARDIANCE ) 10 MG TABS tablet, Take 1 tablet (10 mg total) by mouth daily., Disp: 90 tablet, Rfl: 3   EPINEPHrine  0.3 mg/0.3 mL IJ SOAJ injection, Inject 0.3 mg into the muscle as needed for anaphylaxis., Disp: 2 each, Rfl: 1   ferrous sulfate 325 (65 FE) MG tablet, Take 325 mg by mouth daily with breakfast., Disp: , Rfl:    furosemide  (LASIX ) 40 MG tablet, Take 1 tablet (40 mg total) by mouth every other day. Alternating with 20 mg., Disp: 30 tablet, Rfl: 6   gabapentin  (NEURONTIN ) 100 MG capsule, Take 1 capsule (100 mg total) by mouth 2 (two) times daily., Disp: 180 capsule, Rfl: 1   glucose blood (ACCU-CHEK GUIDE TEST) test strip, Use to check blood sugars once daily., Disp: 100 each, Rfl: 3   hydrOXYzine  (ATARAX ) 25 MG tablet, Take 1/2 tablet (12.5 mg total) by mouth every 8 (eight) hours as needed for up to 6 doses for itching., Disp: 3  tablet, Rfl: 0   ipratropium (ATROVENT ) 0.03 % nasal spray, Place 1-2 sprays into both nostrils 2 (two) times daily as needed (nasal drainage)., Disp: 30 mL, Rfl: 2   lidocaine -prilocaine  (EMLA ) cream, Apply 1 Application topically as needed., Disp: 30 g, Rfl: 0   lidocaine -prilocaine  (EMLA ) cream, Apply to affected area once, Disp: 30 g, Rfl: 3   metFORMIN  (GLUCOPHAGE -XR) 500 MG 24 hr tablet, Take 1 tablet (500 mg total) by mouth  daily with evening meal., Disp: 90 tablet, Rfl: 3   metoprolol  succinate (TOPROL -XL) 25 MG 24 hr tablet, Take 1 tablet (25 mg total) by mouth daily., Disp: 90 tablet, Rfl: 3   multivitamin (ONE-A-DAY MEN'S) TABS tablet, Take 1 tablet by mouth daily with breakfast., Disp: , Rfl:    ondansetron  (ZOFRAN ) 8 MG tablet, Take 1 tablet (8 mg total) by mouth every 8 (eight) hours as needed for nausea or vomiting., Disp: 30 tablet, Rfl: 1   pantoprazole  (PROTONIX ) 40 MG tablet, Take 1 tablet (40 mg total) by mouth daily., Disp: 30 tablet, Rfl: 1   pantoprazole  (PROTONIX ) 40 MG tablet, Take 1 tablet (40 mg total) by mouth 2 (two) times daily., Disp: 180 tablet, Rfl: 3   potassium chloride  (KLOR-CON ) 10 MEQ tablet, Take 2 tablets (20 mEq total) by mouth daily., Disp: 60 tablet, Rfl: 3   prochlorperazine  (COMPAZINE ) 10 MG tablet, Take 1 tablet (10 mg total) by mouth every 6 (six) hours as needed for nausea or vomiting., Disp: 30 tablet, Rfl: 1   rivaroxaban  (XARELTO ) 20 MG TABS tablet, Take 1 tablet (20 mg total) by mouth daily with supper., Disp: 90 tablet, Rfl: 3   spironolactone  (ALDACTONE ) 25 MG tablet, Take 0.5 tablets (12.5 mg total) by mouth daily., Disp: , Rfl:    sucralfate  (CARAFATE ) 1 GM/10ML suspension, Take 10 mLs (1 g total) by mouth 4 (four) times daily -  with meals and at bedtime., Disp: 420 mL, Rfl: 1   traMADol  (ULTRAM ) 50 MG tablet, Take 1 tablet (50 mg total) by mouth every 6 (six) hours as needed., Disp: 60 tablet, Rfl: 0   valsartan  (DIOVAN ) 40 MG tablet, Take 1  tablet (40 mg total) by mouth 2 (two) times daily., Disp: 60 tablet, Rfl: 11   valsartan  (DIOVAN ) 40 MG tablet, Take 0.5 tablets (20 mg total) by mouth 2 (two) times daily., Disp: , Rfl:    Physical Exam:   BP 107/67   Pulse 63   Temp (!) 97.5 F (36.4 C)   Ht 5' 6 (1.676 m)   Wt 159 lb 3.2 oz (72.2 kg)   SpO2 97%   BMI 25.70 kg/m   Pertinent Findings  CN II-XII grossly intact Left cerumen impaction, right EAC clear TM intact with well-pneumatized middle ear space, cauliflower ear right  Weber 512: equal Rinne 512: AC > BC b/l  Anterior rhinoscopy: Septum midline; bilateral inferior turbinates with no hypertrophy No lesions of oral cavity/oropharynx; dentition WNL No obviously palpable neck masses/lymphadenopathy/thyromegaly No respiratory distress or stridor   Seprately Identifiable Procedures:  Procedure: bilateral ear microscopy and cerumen removal using microscope (CPT 30789) - Mod 25 Pre-procedure diagnosis: unilateral cerumen impaction left external auditory canal Post-procedure diagnosis: same Indication: bilateral cerumen impaction; given patient's otologic complaints and history as well as for improved and comprehensive examination of external ear and tympanic membrane, bilateral otologic examination using microscope was performed and impacted cerumen removed  Procedure: Patient was placed semi-recumbent. Both ear canals were examined using the microscope with findings above. Cerumen removed from the left external auditory canal using suction and currette with improvement in EAC examination and patency. Left: EAC was patent. TM was intact . Middle ear was aerated. Drainage: none Right: EAC was patent. TM was intact . Middle ear was aerated . Drainage: none Patient tolerated the procedure well.   Impression & Plans:  Jon Williams is a 73 y.o. male with the following   Assessment and Plan    Cerumen impaction, left  ear Cerumen impaction resolved after manual  removal. No further wax buildup noted. - Advised against ear drops due to potential ineffectiveness and complications. - Recommended regular check-ups every 6-12 months.       - f/u 6-12 months   Thank you for allowing me the opportunity to care for your patient. Please do not hesitate to contact me should you have any other questions.  Sincerely, Chyrl Cohen PA-C [facility] Phone: [phone] Fax: [phone]  01/03/2024, 9:48 AM

## 2024-01-04 DIAGNOSIS — I739 Peripheral vascular disease, unspecified: Secondary | ICD-10-CM | POA: Diagnosis not present

## 2024-01-04 DIAGNOSIS — I5022 Chronic systolic (congestive) heart failure: Secondary | ICD-10-CM | POA: Diagnosis not present

## 2024-01-04 DIAGNOSIS — E1169 Type 2 diabetes mellitus with other specified complication: Secondary | ICD-10-CM | POA: Diagnosis not present

## 2024-01-04 DIAGNOSIS — I4891 Unspecified atrial fibrillation: Secondary | ICD-10-CM | POA: Diagnosis not present

## 2024-01-05 NOTE — Progress Notes (Unsigned)
 [facility]   [phone] [phone]    Patient Care Team: [name], MD as PCP - General (Internal Medicine) [name], Aleene PARAS, MD (Inactive) as PCP - Cardiology (Cardiology) Waddell Danelle ORN, MD as PCP - Electrophysiology (Cardiology) Dasie Leonor CROME, MD as Consulting Physician (General Surgery) Lanny Callander, MD as Consulting Physician (Hematology) Rollin Dover, MD as Consulting Physician (Gastroenterology)   CHIEF COMPLAINT: Follow-up recurrent gastric cancer  Oncology History Overview Note  Cancer Staging Gastric cancer [facility]) Staging form: Stomach, AJCC 8th Edition - Clinical stage from 12/01/2020: Stage IIB (cT3, cN0, cM0) - Signed by Lanny Callander, MD on 12/20/2020 Stage prefix: Initial diagnosis Total positive nodes: 0  Malignant neoplasm of prostate (HCC) Staging form: Prostate, AJCC 8th Edition - Clinical: Stage IIC (cT2b, cN0, cM0, PSA: 6.7, Grade Group: 4) - Unsigned Prostate specific antigen (PSA) range: Less than 10 Gleason score: 8 Histologic grading system: 5 grade system    Gastric cancer (HCC)  12/01/2020 Procedure   Upper Endoscopy, Dr. Rollin  Impression: - Normal esophagus. 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 Biopsied. - Normal examined duodenum.   12/01/2020 Pathology Results   FINAL MICROSCOPIC DIAGNOSIS:   A. INCISURA, BIOPSY:  - Adenocarcinoma, moderate to poorly differentiated arising in a  background of chronic gastritis with intestinal metaplasia.    12/01/2020 Imaging   CT CAP  IMPRESSION: Focal wall thickening along the posterior aspect of the gastric antrum, likely corresponding to the patient's newly diagnosed gastric cancer.   No findings suspicious for metastatic disease.   Mild multifocal pneumonia, lower lobe predominant, likely on the basis of aspiration. Trace right pleural effusion.   Fiducial markers along the prostate in this patient with known prostate cancer.   12/01/2020  Cancer Staging   Staging form: Stomach, AJCC 8th Edition - Clinical stage from 12/01/2020: Stage IIB (cT3, cN0, cM0) - Signed by Lanny Callander, MD on 12/20/2020 Stage prefix: Initial diagnosis Total positive nodes: 0   12/08/2020 Initial Diagnosis   Gastric cancer (HCC)   12/16/2020 Procedure   EUS  - Wall thickening was seen in the lesser curve of the stomach and in the antrum of the stomach. The thickening appeared to be primarily within the serosa (Layer 5). T3 N0 Mx. - There was no sign of significant pathology in the entire pancreas. - There was no sign of significant pathology in the common bile duct and in the gallbladder. - There was no evidence of significant pathology in the left lobe of the liver. - Endosonographic images of the left adrenal gland were unremarkable. - The celiac trunk and superior mesenteric artery were endosonographically normal. - The mediastinum was unremarkable endosonographically. - No specimens collected.   01/02/2021 - 03/23/2021 Chemotherapy   Patient is on Treatment Plan : GASTROESOPHAGEAL FLOT q14d X 4 cycles     04/01/2021 Imaging   EXAM: CT ABDOMEN AND PELVIS WITHOUT CONTRAST  IMPRESSION: 1. No acute findings in the abdomen or pelvis. Specifically, no evidence for metastatic disease in the abdomen or pelvis. 2. Stable 16 mm left adrenal adenoma. 3. Small fat containing hernias in the right groin and umbilical region. 4. Bilateral pars interarticularis defects at L4 with 10 mm anterolisthesis of L4 on 5, stable. 5. Aortic Atherosclerosis (ICD10-I70.0).   04/13/2021 Imaging   EXAM: CT CHEST WITHOUT CONTRAST  IMPRESSION: 1. No evidence of metastatic disease in the chest. 2. Enlargement of the main pulmonary artery, as can be seen in pulmonary hypertension. 3.  Coronary artery disease.   Aortic Atherosclerosis (ICD10-I70.0).   06/09/2021 Procedure   Upper GI Endoscopy, Dr. Rollin  Findings: -The esophagus was normal. -A medium healed ulcer  was found on the lesser curvature of the stomach. Biopsies were taken with a cold forceps for histology. -The examined duodenum was normal. -Compared to the prior EGD there is a marked improvement in his gastric cancer. There was only evidence of erythema and a central scar at the incisura. Cold biopsies of the area were obtained, but there was no gross evidence of malignancy.  Impression: - Normal esophagus. - Scar in the lesser curvature of the stomach. Biopsied. - Normal examined duodenum.   06/09/2021 Pathology Results   FINAL MICROSCOPIC DIAGNOSIS:   A. STOMACH, BIOPSY:  - Gastric mucosa with acute and chronic inflammation.  - No dysplasia or malignancy identified.    06/16/2021 - 06/16/2021 Chemotherapy   Patient is on Treatment Plan : GASTROESOPHAGEAL Pembrolizumab (200) q21d     10/28/2023 -  Chemotherapy   Patient is on Treatment Plan : GASTRIC Nivolumab  (240) D1,15,29 + Ipilimumab  (1) D1 q42d        CURRENT THERAPY: IpiP/Nivo  INTERVAL HISTORY Mr. Din returns for follow-up and treatment as scheduled, doing well overall.  He has been waking up with itching in the middle of his back, no rash, and does not itch during the day.  His wife has been applying creams which help. Holding losartan  for lip tightness.  Otherwise doing well, eating and drinking okay, denies cough, diarrhea, pain, or any other new or specific complaints.  ROS  All other systems reviewed and negative  Past Medical History:  Diagnosis Date   AICD (automatic cardioverter/defibrillator) present 05/25/2016   biv icd   Angio-edema    Bunion, right    Chronic combined systolic and diastolic CHF (congestive heart failure) (HCC)    Echo 1/18: Mild conc LVH, EF 15-20, severe diff HK, inf and inf-septal AK, Gr 3 DD, mild to mod MR, severe LAE, mod reduced RVSF, mod RAE, mild TR, PASP 50   Coronary artery disease involving native coronary artery without angina pectoris 04/17/2016   LHC 1/18: pLCx 30, mLCx 20,  mRCA 40, dRCA 20, LVEDP 23, mean RA 8, PA 42/20, PCWP 17   Diabetes mellitus without complication (HCC)    Gastric cancer (HCC)    History of atrial fibrillation    History of atrial flutter    History of cardiomegaly 06/07/2016   Noted on CXR   History of colon polyps 06/28/2017   Noted on colonoscopy   Hypertension    LBBB (left bundle branch block)    Nausea vomiting and diarrhea 04/04/2021   NICM (nonischemic cardiomyopathy) (HCC)    Echo 1/18:  Mild conc LVH, EF 15-20, severe diff HK, inf and inf-septal AK, Gr 3 DD, mild to mod MR, severe LAE, mod reduced RVSF, mod RAE, mild TR, PASP 50   Other secondary pulmonary hypertension (HCC) 04/17/2016   Prostate cancer (HCC) 2019   Sigmoid diverticulosis 06/28/2017   Noted on colonoscopy     Past Surgical History:  Procedure Laterality Date   BIOPSY  12/01/2020   Procedure: BIOPSY;  Surgeon: Rollin Dover, MD;  Location: [facility] ENDOSCOPY;  Service: Endoscopy;;   BIOPSY  06/09/2021   Procedure: BIOPSY;  Surgeon: Rollin Dover, MD;  Location: [facility] ENDOSCOPY;  Service: Gastroenterology;;   BIOPSY  06/29/2022   Procedure: BIOPSY;  Surgeon: Rollin Dover, MD;  Location: [facility] ENDOSCOPY;  Service: Gastroenterology;;  BIV ICD INSERTION CRT-D N/A 05/25/2016   Procedure: BiV ICD Insertion CRT-D;  Surgeon: Danelle LELON Birmingham, MD;  Location: [facility] INVASIVE CV LAB;  Service: Cardiovascular;  Laterality: N/A;   CARDIAC CATHETERIZATION N/A 03/02/2016   Procedure: Right/Left Heart Cath and Coronary Angiography;  Surgeon: Lonni Hanson, MD;  Location: [facility] INVASIVE CV LAB;  Service: Cardiovascular;  Laterality: N/A;   CARDIOVERSION N/A 07/17/2016   Procedure: Cardioversion;  Surgeon: Birmingham Danelle LELON, MD;  Location: [facility] INVASIVE CV LAB;  Service: Cardiovascular;  Laterality: N/A;   COLONOSCOPY WITH PROPOFOL  N/A 06/28/2017   Procedure: COLONOSCOPY WITH PROPOFOL ;  Surgeon: Rollin Dover, MD;  Location: [facility] ENDOSCOPY;  Service: Endoscopy;  Laterality: N/A;   colonscopy  2009    ESOPHAGOGASTRODUODENOSCOPY N/A 12/01/2020   Procedure: ESOPHAGOGASTRODUODENOSCOPY (EGD);  Surgeon: Rollin Dover, MD;  Location: [facility] ENDOSCOPY;  Service: Endoscopy;  Laterality: N/A;  IDA/guaiac positive stools   ESOPHAGOGASTRODUODENOSCOPY N/A 10/04/2023   Procedure: EGD (ESOPHAGOGASTRODUODENOSCOPY);  Surgeon: Rollin Dover, MD;  Location: [facility]COPY;  Service: Gastroenterology;  Laterality: N/A;   ESOPHAGOGASTRODUODENOSCOPY (EGD) WITH PROPOFOL  N/A 12/16/2020   Procedure: ESOPHAGOGASTRODUODENOSCOPY (EGD) WITH PROPOFOL ;  Surgeon: Rollin Dover, MD;  Location: [facility] ENDOSCOPY;  Service: Endoscopy;  Laterality: N/A;   ESOPHAGOGASTRODUODENOSCOPY (EGD) WITH PROPOFOL  N/A 06/09/2021   Procedure: ESOPHAGOGASTRODUODENOSCOPY (EGD) WITH PROPOFOL ;  Surgeon: Rollin Dover, MD;  Location: [facility] ENDOSCOPY;  Service: Gastroenterology;  Laterality: N/A;   ESOPHAGOGASTRODUODENOSCOPY (EGD) WITH PROPOFOL  N/A 06/29/2022   Procedure: ESOPHAGOGASTRODUODENOSCOPY (EGD) WITH PROPOFOL ;  Surgeon: Rollin Dover, MD;  Location: [facility] ENDOSCOPY;  Service: Gastroenterology;  Laterality: N/A;   GOLD SEED IMPLANT N/A 12/24/2017   Procedure: GOLD SEED IMPLANT, TRANSERINEAL;  Surgeon: Nieves Cough, MD;  Location: [facility] ORS;  Service: Urology;  Laterality: N/A;   INSERT / REPLACE / REMOVE PACEMAKER     IR ANGIOGRAM SELECTIVE EACH ADDITIONAL VESSEL  11/04/2023   IR ANGIOGRAM SELECTIVE EACH ADDITIONAL VESSEL  11/04/2023   IR CV LINE INJECTION  10/25/2023   IR REMOVAL TUN ACCESS W/ PORT W/O FL MOD SED  11/04/2023   IR US  GUIDE VASC ACCESS RIGHT  11/04/2023   IR VENOCAVAGRAM SVC  11/04/2023   LEAD REVISION  10/10/2018   LEAD REVISION/REPAIR N/A 10/10/2018   Procedure: LEAD REVISION/REPAIR;  Surgeon: Birmingham Danelle LELON, MD;  Location: [facility] INVASIVE CV LAB;  Service: Cardiovascular;  Laterality: N/A;   POLYPECTOMY  06/28/2017   Procedure: POLYPECTOMY;  Surgeon: Rollin Dover, MD;  Location: [facility] ENDOSCOPY;  Service: Endoscopy;;  ascending and descending  colon polyp   PORTACATH PLACEMENT Right 12/23/2020   Procedure: INSERTION PORT-A-CATH;  Surgeon: Dasie Leonor CROME, MD;  Location: [facility] OR;  Service: General;  Laterality: Right;   PROSTATE BIOPSY  02/20/2017   RIGHT HEART CATH N/A 07/25/2023   Procedure: RIGHT HEART CATH;  Surgeon: Rolan Ezra RAMAN, MD;  Location: [facility] INVASIVE CV LAB;  Service: Cardiovascular;  Laterality: N/A;   RIGHT/LEFT HEART CATH AND CORONARY ANGIOGRAPHY N/A 10/30/2022   Procedure: RIGHT/LEFT HEART CATH AND CORONARY ANGIOGRAPHY;  Surgeon: Rolan Ezra RAMAN, MD;  Location: [facility] INVASIVE CV LAB;  Service: Cardiovascular;  Laterality: N/A;   SPACE OAR INSTILLATION N/A 12/24/2017   Procedure: SPACE OAR INSTILLATION;  Surgeon: Nieves Cough, MD;  Location: [facility] ORS;  Service: Urology;  Laterality: N/A;   TOTAL KNEE ARTHROPLASTY Right 08/16/2017   Procedure: RIGHT TOTAL KNEE ARTHROPLASTY;  Surgeon: Shari Sieving, MD;  Location: [facility] OR;  Service: Orthopedics;  Laterality: Right;   UPPER ESOPHAGEAL ENDOSCOPIC ULTRASOUND (EUS) N/A 12/16/2020   Procedure: UPPER  ESOPHAGEAL ENDOSCOPIC ULTRASOUND (EUS);  Surgeon: Rollin Dover, MD;  Location: [facility];  Service: Endoscopy;  Laterality: N/A;     Outpatient Encounter Medications as of 01/06/2024  Medication Sig   Accu-Chek Softclix Lancets lancets Use to check your blood sugar finger stick once a day Dx Code E11.9 90 days   amiodarone  (PACERONE ) 200 MG tablet Take 1 tablet (200 mg total) by mouth daily.   atorvastatin  (LIPITOR) 40 MG tablet Take 1 tablet (40 mg total) by mouth daily.   Biotin 1000 MCG tablet Take 1,000 mcg by mouth daily with breakfast.   Blood Glucose Monitoring Suppl (ACCU-CHEK GUIDE) w/Device KIT as directed to check blood sugar once a day   cetirizine  (ZYRTEC ) 10 MG tablet Take 1 tablet (10 mg total) by mouth daily.   cholecalciferol  (VITAMIN D3) 25 MCG (1000 UT) tablet Take 1,000 Units by mouth daily with breakfast.   digoxin  (LANOXIN ) 0.125 MG tablet Take 1/2 tablet  (0.0625mg  total) by mouth daily.   empagliflozin  (JARDIANCE ) 10 MG TABS tablet Take 1 tablet (10 mg total) by mouth daily.   EPINEPHrine  0.3 mg/0.3 mL IJ SOAJ injection Inject 0.3 mg into the muscle as needed for anaphylaxis.   ferrous sulfate 325 (65 FE) MG tablet Take 325 mg by mouth daily with breakfast.   furosemide  (LASIX ) 40 MG tablet Take 1 tablet (40 mg total) by mouth every other day. Alternating with 20 mg.   gabapentin  (NEURONTIN ) 100 MG capsule Take 1 capsule (100 mg total) by mouth 2 (two) times daily.   glucose blood (ACCU-CHEK GUIDE TEST) test strip Use to check blood sugars once daily.   hydrOXYzine  (ATARAX ) 25 MG tablet Take 1/2 tablet (12.5 mg total) by mouth every 8 (eight) hours as needed for up to 6 doses for itching.   ipratropium (ATROVENT ) 0.03 % nasal spray Place 1-2 sprays into both nostrils 2 (two) times daily as needed (nasal drainage).   lidocaine -prilocaine  (EMLA ) cream Apply 1 Application topically as needed.   lidocaine -prilocaine  (EMLA ) cream Apply to affected area once   metFORMIN  (GLUCOPHAGE -XR) 500 MG 24 hr tablet Take 1 tablet (500 mg total) by mouth daily with evening meal.   metoprolol  succinate (TOPROL -XL) 25 MG 24 hr tablet Take 1 tablet (25 mg total) by mouth daily.   multivitamin (ONE-A-DAY MEN'S) TABS tablet Take 1 tablet by mouth daily with breakfast.   ondansetron  (ZOFRAN ) 8 MG tablet Take 1 tablet (8 mg total) by mouth every 8 (eight) hours as needed for nausea or vomiting.   pantoprazole  (PROTONIX ) 40 MG tablet Take 1 tablet (40 mg total) by mouth daily.   pantoprazole  (PROTONIX ) 40 MG tablet Take 1 tablet (40 mg total) by mouth 2 (two) times daily.   potassium chloride  (KLOR-CON ) 10 MEQ tablet Take 2 tablets (20 mEq total) by mouth daily.   prochlorperazine  (COMPAZINE ) 10 MG tablet Take 1 tablet (10 mg total) by mouth every 6 (six) hours as needed for nausea or vomiting.   rivaroxaban  (XARELTO ) 20 MG TABS tablet Take 1 tablet (20 mg total) by mouth  daily with supper.   spironolactone  (ALDACTONE ) 25 MG tablet Take 0.5 tablets (12.5 mg total) by mouth daily.   sucralfate  (CARAFATE ) 1 GM/10ML suspension Take 10 mLs (1 g total) by mouth 4 (four) times daily -  with meals and at bedtime.   traMADol  (ULTRAM ) 50 MG tablet Take 1 tablet (50 mg total) by mouth every 6 (six) hours as needed.   No facility-administered encounter medications on file as of  01/06/2024.     Today's Vitals   01/06/24 1157 01/06/24 1205  BP: (!) 104/50   Pulse: 61   Resp: 17   Temp: 97.8 F (36.6 C)   SpO2: 99%   Weight: 163 lb 3.2 oz (74 kg)   PainSc:  0-No pain   Body mass index is 26.34 kg/m.   ECOG PERFORMANCE STATUS: 1 - Symptomatic but completely ambulatory  PHYSICAL EXAM GENERAL:alert, no distress and comfortable SKIN: no rash  EYES: sclera clear NECK: without mass LYMPH:  no palpable cervical or supraclavicular lymphadenopathy  LUNGS: clear with normal breathing effort HEART: regular rate & rhythm, no lower extremity edema ABDOMEN: abdomen soft, non-tender and normal bowel sounds NEURO: alert & oriented x 3 with fluent speech Picc without erythema    CBC    Latest Ref Rng & Units 01/06/2024   10:55 AM 12/23/2023   10:41 AM 12/09/2023   12:48 PM  CBC  WBC 4.0 - 10.5 K/uL 6.2  5.2  6.9   Hemoglobin 13.0 - 17.0 g/dL 87.8  88.6  89.4   Hematocrit 39.0 - 52.0 % 38.5  36.6  33.9   Platelets 150 - 400 K/uL 200  213  339       CMP     Latest Ref Rng & Units 01/06/2024   10:55 AM 12/23/2023   10:41 AM 12/09/2023   12:48 PM  CMP  Glucose 70 - 99 mg/dL 890  899  881   BUN 8 - 23 mg/dL 28  24  31    Creatinine 0.61 - 1.24 mg/dL 8.73  8.76  8.86   Sodium 135 - 145 mmol/L 139  136  133   Potassium 3.5 - 5.1 mmol/L 4.5  4.7  4.3   Chloride 98 - 111 mmol/L 102  103  102   CO2 22 - 32 mmol/L 26  28  26    Calcium  8.9 - 10.3 mg/dL 9.4  9.2  9.1   Total Protein 6.5 - 8.1 g/dL 7.3  6.9  7.6   Total Bilirubin 0.0 - 1.2 mg/dL 0.5  0.5  0.4    Alkaline Phos 38 - 126 U/L 69  52  53   AST 15 - 41 U/L 30  25  18    ALT 0 - 44 U/L 36  30  19       ASSESSMENT & PLAN:73 year old male    Gastric cancer, cT3N0M0, stage II, local recurrence 09/2023 -Diagnosed 11/2020, path showed loss of MLH1 and PMS2 which predicts good response to immunotherapy  -Deemed a high risk surgical candidate.  S/p FLOT4 01/02/2021 - 03/22/2021 -tolerated well initially, but developed poor tolerance, neuropathy, and low performance status and stopped after 6 cycles -Posttreatment EUS 05/2021 and 06/29/2022 by Dr. Rollin showed a scar and residual erythema in stomach, biopsy negative for residual malignancy.  -Surveillance CT scan from 04/30/23 showed no evidence of residual or recurrent malignancy.  Small liver lesion is stable, favored to be benign. -He developed postprandial pain and weight loss and was hospitalized 09/2023, EGD unfortunately showed malignant appearing mass in the lesser curvature of the stomach. 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 Dr. Lanny does not plan to offer intensive chemo due to his age, CHF, and previous poor  tolerance.  -Began Ipi/Nivo on 10/28/2023  -Mr. Quast appears stable, tolerating IpiNivo well overall.  We reviewed symptom management for itching, no rash.  Otherwise doing well with adequate performance status at home.  There is no clinical evidence of disease progression.   Anemia -Currently on oral iron , will check ferritin/iron /TIBC to see if he needs IV iron    Malignant neoplasm of prostate (HCC) -diagnosed with Gleason 4+4 prostate cancer in early 2019.  -S/p long-term ADT in combination with 8 weeks of IMRT.     PLAN: - Labs reviewed - BP  soft, Scr 1.26, give 500 cc NS with treatment and continue holding losartan  - Proceed with cycle 2 day 29 nivolumab  today, no dose modifications - Follow-up and cycle 3 day 1 Ipi/nivo in 2 weeks - Restaging PET 12/29 as scheduled   All questions were answered. The patient knows to call the clinic with any problems, questions or concerns. No barriers to learning were detected.  Lavaeh Bau K Cavon Nicolls, NP 01/06/2024

## 2024-01-06 ENCOUNTER — Other Ambulatory Visit (HOSPITAL_COMMUNITY): Payer: Self-pay

## 2024-01-06 ENCOUNTER — Encounter: Payer: Self-pay | Admitting: Nurse Practitioner

## 2024-01-06 ENCOUNTER — Inpatient Hospital Stay

## 2024-01-06 ENCOUNTER — Inpatient Hospital Stay: Admitting: Nurse Practitioner

## 2024-01-06 VITALS — BP 104/50 | HR 61 | Temp 97.8°F | Resp 17 | Wt 163.2 lb

## 2024-01-06 VITALS — BP 114/69 | HR 64

## 2024-01-06 DIAGNOSIS — C163 Malignant neoplasm of pyloric antrum: Secondary | ICD-10-CM | POA: Diagnosis not present

## 2024-01-06 DIAGNOSIS — Z5112 Encounter for antineoplastic immunotherapy: Secondary | ICD-10-CM | POA: Diagnosis not present

## 2024-01-06 DIAGNOSIS — D509 Iron deficiency anemia, unspecified: Secondary | ICD-10-CM

## 2024-01-06 LAB — CBC WITH DIFFERENTIAL (CANCER CENTER ONLY)
Abs Immature Granulocytes: 0.02 K/uL (ref 0.00–0.07)
Basophils Absolute: 0 K/uL (ref 0.0–0.1)
Basophils Relative: 0 %
Eosinophils Absolute: 0 K/uL (ref 0.0–0.5)
Eosinophils Relative: 1 %
HCT: 38.5 % — ABNORMAL LOW (ref 39.0–52.0)
Hemoglobin: 12.1 g/dL — ABNORMAL LOW (ref 13.0–17.0)
Immature Granulocytes: 0 %
Lymphocytes Relative: 14 %
Lymphs Abs: 0.8 K/uL (ref 0.7–4.0)
MCH: 29.7 pg (ref 26.0–34.0)
MCHC: 31.4 g/dL (ref 30.0–36.0)
MCV: 94.6 fL (ref 80.0–100.0)
Monocytes Absolute: 0.7 K/uL (ref 0.1–1.0)
Monocytes Relative: 12 %
Neutro Abs: 4.5 K/uL (ref 1.7–7.7)
Neutrophils Relative %: 73 %
Platelet Count: 200 K/uL (ref 150–400)
RBC: 4.07 MIL/uL — ABNORMAL LOW (ref 4.22–5.81)
RDW: 19.6 % — ABNORMAL HIGH (ref 11.5–15.5)
WBC Count: 6.2 K/uL (ref 4.0–10.5)
nRBC: 0 % (ref 0.0–0.2)

## 2024-01-06 LAB — CMP (CANCER CENTER ONLY)
ALT: 36 U/L (ref 0–44)
AST: 30 U/L (ref 15–41)
Albumin: 4.4 g/dL (ref 3.5–5.0)
Alkaline Phosphatase: 69 U/L (ref 38–126)
Anion gap: 11 (ref 5–15)
BUN: 28 mg/dL — ABNORMAL HIGH (ref 8–23)
CO2: 26 mmol/L (ref 22–32)
Calcium: 9.4 mg/dL (ref 8.9–10.3)
Chloride: 102 mmol/L (ref 98–111)
Creatinine: 1.26 mg/dL — ABNORMAL HIGH (ref 0.61–1.24)
GFR, Estimated: 59 mL/min — ABNORMAL LOW (ref >60)
Glucose, Bld: 109 mg/dL — ABNORMAL HIGH (ref 70–99)
Potassium: 4.5 mmol/L (ref 3.5–5.1)
Sodium: 139 mmol/L (ref 135–145)
Total Bilirubin: 0.5 mg/dL (ref 0.0–1.2)
Total Protein: 7.3 g/dL (ref 6.5–8.1)

## 2024-01-06 LAB — FERRITIN: Ferritin: 47 ng/mL (ref 24–336)

## 2024-01-06 MED ORDER — SODIUM CHLORIDE 0.9 % IV SOLN: INTRAVENOUS | Status: DC

## 2024-01-06 MED ORDER — SODIUM CHLORIDE 0.9 % IV SOLN: Freq: Once | INTRAVENOUS | Status: AC

## 2024-01-06 MED ORDER — SODIUM CHLORIDE 0.9 % IV SOLN
240.0000 mg | Freq: Once | INTRAVENOUS | Status: AC
  Administered 2024-01-06: 240 mg via INTRAVENOUS
  Filled 2024-01-06: qty 24

## 2024-01-06 NOTE — Patient Instructions (Signed)
 [facility] - A DEPT OF [facility]  Discharge Instructions: Thank you for choosing [facility] to provide your oncology and hematology care.   If you have a lab appointment with the Cancer Center, please go directly to the Cancer Center and check in at the registration area.   Wear comfortable clothing and clothing appropriate for easy access to any Portacath or PICC line.   We strive to give you quality time with your provider. You may need to reschedule your appointment if you arrive late (15 or more minutes).  Arriving late affects you and other patients whose appointments are after yours.  Also, if you miss three or more appointments without notifying the office, you may be dismissed from the clinic at the provider's discretion.      For prescription refill requests, have your pharmacy contact our office and allow 72 hours for refills to be completed.    Today you received the following chemotherapy and/or immunotherapy agents: nivolumab (OPDIVO)       To help prevent nausea and vomiting after your treatment, we encourage you to take your nausea medication as directed.  BELOW ARE SYMPTOMS THAT SHOULD BE REPORTED IMMEDIATELY: *FEVER GREATER THAN 100.4 F (38 C) OR HIGHER *CHILLS OR SWEATING *NAUSEA AND VOMITING THAT IS NOT CONTROLLED WITH YOUR NAUSEA MEDICATION *UNUSUAL SHORTNESS OF BREATH *UNUSUAL BRUISING OR BLEEDING *URINARY PROBLEMS (pain or burning when urinating, or frequent urination) *BOWEL PROBLEMS (unusual diarrhea, constipation, pain near the anus) TENDERNESS IN MOUTH AND THROAT WITH OR WITHOUT PRESENCE OF ULCERS (sore throat, sores in mouth, or a toothache) UNUSUAL RASH, SWELLING OR PAIN  UNUSUAL VAGINAL DISCHARGE OR ITCHING   Items with * indicate a potential emergency and should be followed up as soon as possible or go to the Emergency Department if any problems should occur.  Please show the CHEMOTHERAPY ALERT CARD or  IMMUNOTHERAPY ALERT CARD at check-in to the Emergency Department and triage nurse.  Should you have questions after your visit or need to cancel or reschedule your appointment, please contact [facility] - A DEPT OF [facility]  Dept: [phone]  and follow the prompts.  Office hours are 8:00 a.m. to 4:30 p.m. Monday - Friday. Please note that voicemails left after 4:00 p.m. may not be returned until the following business day.  We are closed weekends and major holidays. You have access to a nurse at all times for urgent questions. Please call the main number to the clinic Dept: [phone] and follow the prompts.   For any non-urgent questions, you may also contact your provider using MyChart. We now offer e-Visits for anyone 3 and older to request care online for non-urgent symptoms. For details visit [email or web address].   Also download the MyChart app! Go to the app store, search "MyChart", open the app, select [place], and log in with your MyChart username and password.

## 2024-01-07 ENCOUNTER — Other Ambulatory Visit (HOSPITAL_COMMUNITY): Payer: Self-pay

## 2024-01-08 ENCOUNTER — Other Ambulatory Visit: Payer: Self-pay

## 2024-01-08 ENCOUNTER — Other Ambulatory Visit (HOSPITAL_COMMUNITY): Payer: Self-pay

## 2024-01-08 ENCOUNTER — Ambulatory Visit (INDEPENDENT_AMBULATORY_CARE_PROVIDER_SITE_OTHER): Admitting: Podiatry

## 2024-01-08 ENCOUNTER — Ambulatory Visit: Payer: Medicare HMO

## 2024-01-08 DIAGNOSIS — M79672 Pain in left foot: Secondary | ICD-10-CM | POA: Diagnosis not present

## 2024-01-08 DIAGNOSIS — M79671 Pain in right foot: Secondary | ICD-10-CM

## 2024-01-08 DIAGNOSIS — I428 Other cardiomyopathies: Secondary | ICD-10-CM

## 2024-01-08 DIAGNOSIS — B351 Tinea unguium: Secondary | ICD-10-CM | POA: Diagnosis not present

## 2024-01-08 NOTE — Progress Notes (Signed)
 Patient presents for evaluation and treatment of tenderness and some redness around nails feet.  Tenderness around toes with walking and wearing shoes.  Physical exam:  General appearance: Alert, pleasant, and in no acute distress.  Vascular: Pedal pulses: DP 2/4 B/L, PT 0/4 B/L. Mild edema lower legs bilaterally.  Capillary refill time immediate bilaterally  Neurologic:  Dermatologic:  Nails thickened, disfigured, discolored 1-5 BL with subungual debris.  Redness and hypertrophic nail folds along nail folds bilaterally but no signs of drainage or infection.  Musculoskeletal:     Diagnosis: 1. Painful onychomycotic nails 1 through 5 bilaterally. 2. Pain toes 1 through 5 bilaterally.  Plan: -Debrided onychomycotic nails 1 through 5 bilaterally.  Sharply debrided nails with nail clipper and reduced with a power bur.  Return 3 months [facility]

## 2024-01-10 ENCOUNTER — Other Ambulatory Visit: Payer: Self-pay

## 2024-01-10 ENCOUNTER — Other Ambulatory Visit (HOSPITAL_COMMUNITY): Payer: Self-pay

## 2024-01-10 LAB — CUP PACEART REMOTE DEVICE CHECK
Battery Remaining Longevity: 16 mo
Battery Remaining Percentage: 16 %
Battery Voltage: 2.74 V
Brady Statistic RV Percent Paced: 90 %
Date Time Interrogation Session: 20251126020017
HighPow Impedance: 48 Ohm
HighPow Impedance: 48 Ohm
Implantable Lead Connection Status: 753985
Implantable Lead Connection Status: 753985
Implantable Lead Implant Date: 20180413
Implantable Lead Implant Date: 20200828
Implantable Lead Location: 753859
Implantable Lead Location: 753860
Implantable Lead Model: 7122
Implantable Pulse Generator Implant Date: 20180413
Lead Channel Impedance Value: 360 Ohm
Lead Channel Impedance Value: 750 Ohm
Lead Channel Pacing Threshold Amplitude: 1.5 V
Lead Channel Pacing Threshold Pulse Width: 0.5 ms
Lead Channel Sensing Intrinsic Amplitude: 0.7 mV
Lead Channel Sensing Intrinsic Amplitude: 2.6 mV
Lead Channel Setting Pacing Amplitude: 3 V
Lead Channel Setting Pacing Pulse Width: 0.5 ms
Lead Channel Setting Sensing Sensitivity: 0.5 mV
Pulse Gen Serial Number: 7398151
Zone Setting Status: 755011

## 2024-01-12 DIAGNOSIS — E1169 Type 2 diabetes mellitus with other specified complication: Secondary | ICD-10-CM | POA: Diagnosis not present

## 2024-01-12 DIAGNOSIS — I5022 Chronic systolic (congestive) heart failure: Secondary | ICD-10-CM | POA: Diagnosis not present

## 2024-01-12 DIAGNOSIS — I4891 Unspecified atrial fibrillation: Secondary | ICD-10-CM | POA: Diagnosis not present

## 2024-01-12 DIAGNOSIS — I739 Peripheral vascular disease, unspecified: Secondary | ICD-10-CM | POA: Diagnosis not present

## 2024-01-13 ENCOUNTER — Other Ambulatory Visit (HOSPITAL_COMMUNITY): Payer: Self-pay

## 2024-01-13 NOTE — Progress Notes (Signed)
 Remote ICD Transmission

## 2024-01-15 ENCOUNTER — Other Ambulatory Visit (HOSPITAL_COMMUNITY): Payer: Self-pay

## 2024-01-15 ENCOUNTER — Ambulatory Visit: Admitting: Podiatry

## 2024-01-15 ENCOUNTER — Other Ambulatory Visit: Payer: Self-pay

## 2024-01-16 ENCOUNTER — Ambulatory Visit: Payer: Self-pay | Admitting: Internal Medicine

## 2024-01-17 ENCOUNTER — Other Ambulatory Visit (HOSPITAL_COMMUNITY): Payer: Self-pay

## 2024-01-19 NOTE — Assessment & Plan Note (Signed)
-  diagnosed with Gleason 4+4 prostate cancer in early 2019. He was treated with long-term ADT in combination with 8 weeks of IMRT.

## 2024-01-19 NOTE — Assessment & Plan Note (Signed)
 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  S/p FLOT4 01/02/2021 - 03/22/2021 -Posttreatment EUS 05/2021 and 06/29/2022 by Dr. Rollin showed a scar and residual erythema in stomach, biopsy negative for residual malignancy.  -Jon Williams is clinically doing well on surveillance. Last CT 03/20/22 which shows no evidence of recurrent or metastatic disease, and stable bilateral adrenal adenomas -Surveillance CT scan from October 09, 2022 showed no evidence of recurrence -Surveillance CT scan from April 30, 2023 showed no evidence of residual or recurrent malignancy.  Small liver lesion is stable, favored to be benign. -He unfortunately had local recurrence in 09/2023 -he started Nivo 240mg  q2w and Ipi 1mg /kg q6w (for 2 doses only) on 10/28/2023

## 2024-01-20 ENCOUNTER — Inpatient Hospital Stay

## 2024-01-20 ENCOUNTER — Inpatient Hospital Stay: Attending: Hematology | Admitting: Hematology

## 2024-01-20 VITALS — BP 119/79 | HR 61 | Temp 97.8°F | Resp 18 | Ht 66.0 in | Wt 159.8 lb

## 2024-01-20 DIAGNOSIS — D509 Iron deficiency anemia, unspecified: Secondary | ICD-10-CM

## 2024-01-20 DIAGNOSIS — Z5112 Encounter for antineoplastic immunotherapy: Secondary | ICD-10-CM | POA: Insufficient documentation

## 2024-01-20 DIAGNOSIS — C163 Malignant neoplasm of pyloric antrum: Secondary | ICD-10-CM

## 2024-01-20 DIAGNOSIS — Z79620 Long term (current) use of immunosuppressive biologic: Secondary | ICD-10-CM | POA: Diagnosis not present

## 2024-01-20 DIAGNOSIS — C61 Malignant neoplasm of prostate: Secondary | ICD-10-CM

## 2024-01-20 LAB — CMP (CANCER CENTER ONLY)
ALT: 42 U/L (ref 0–44)
AST: 32 U/L (ref 15–41)
Albumin: 4.5 g/dL (ref 3.5–5.0)
Alkaline Phosphatase: 69 U/L (ref 38–126)
Anion gap: 12 (ref 5–15)
BUN: 29 mg/dL — ABNORMAL HIGH (ref 8–23)
CO2: 25 mmol/L (ref 22–32)
Calcium: 9.9 mg/dL (ref 8.9–10.3)
Chloride: 105 mmol/L (ref 98–111)
Creatinine: 1.23 mg/dL (ref 0.61–1.24)
GFR, Estimated: >60 mL/min (ref >60)
Glucose, Bld: 97 mg/dL (ref 70–99)
Potassium: 4.3 mmol/L (ref 3.5–5.1)
Sodium: 141 mmol/L (ref 135–145)
Total Bilirubin: 0.5 mg/dL (ref 0.0–1.2)
Total Protein: 7.6 g/dL (ref 6.5–8.1)

## 2024-01-20 LAB — CBC WITH DIFFERENTIAL (CANCER CENTER ONLY)
Abs Immature Granulocytes: 0.01 K/uL (ref 0.00–0.07)
Basophils Absolute: 0 K/uL (ref 0.0–0.1)
Basophils Relative: 0 %
Eosinophils Absolute: 0.1 K/uL (ref 0.0–0.5)
Eosinophils Relative: 1 %
HCT: 40.8 % (ref 39.0–52.0)
Hemoglobin: 12.9 g/dL — ABNORMAL LOW (ref 13.0–17.0)
Immature Granulocytes: 0 %
Lymphocytes Relative: 11 %
Lymphs Abs: 0.7 K/uL (ref 0.7–4.0)
MCH: 30 pg (ref 26.0–34.0)
MCHC: 31.6 g/dL (ref 30.0–36.0)
MCV: 94.9 fL (ref 80.0–100.0)
Monocytes Absolute: 0.7 K/uL (ref 0.1–1.0)
Monocytes Relative: 11 %
Neutro Abs: 4.8 K/uL (ref 1.7–7.7)
Neutrophils Relative %: 77 %
Platelet Count: 218 K/uL (ref 150–400)
RBC: 4.3 MIL/uL (ref 4.22–5.81)
RDW: 19 % — ABNORMAL HIGH (ref 11.5–15.5)
WBC Count: 6.2 K/uL (ref 4.0–10.5)
nRBC: 0 % (ref 0.0–0.2)

## 2024-01-20 LAB — TSH: TSH: 1.54 u[IU]/mL (ref 0.350–4.500)

## 2024-01-20 LAB — FERRITIN: Ferritin: 89 ng/mL (ref 24–336)

## 2024-01-20 MED ORDER — SODIUM CHLORIDE 0.9 % IV SOLN: INTRAVENOUS | Status: DC

## 2024-01-20 MED ORDER — SODIUM CHLORIDE 0.9 % IV SOLN
240.0000 mg | Freq: Once | INTRAVENOUS | Status: AC
  Administered 2024-01-20: 240 mg via INTRAVENOUS
  Filled 2024-01-20: qty 24

## 2024-01-20 NOTE — Patient Instructions (Signed)
 [facility] - A DEPT OF [facility]   Discharge Instructions: Thank you for choosing [facility] to provide your oncology and hematology care.   If you have a lab appointment with the Cancer Center, please go directly to the Cancer Center and check in at the registration area.   Wear comfortable clothing and clothing appropriate for easy access to any Portacath or PICC line.   We strive to give you quality time with your provider. You may need to reschedule your appointment if you arrive late (15 or more minutes).  Arriving late affects you and other patients whose appointments are after yours.  Also, if you miss three or more appointments without notifying the office, you may be dismissed from the clinic at the provider's discretion.      For prescription refill requests, have your pharmacy contact our office and allow 72 hours for refills to be completed.    Today you received the following chemotherapy and/or immunotherapy agents: Nivolumab (Opdivo)      To help prevent nausea and vomiting after your treatment, we encourage you to take your nausea medication as directed.  BELOW ARE SYMPTOMS THAT SHOULD BE REPORTED IMMEDIATELY: *FEVER GREATER THAN 100.4 F (38 C) OR HIGHER *CHILLS OR SWEATING *NAUSEA AND VOMITING THAT IS NOT CONTROLLED WITH YOUR NAUSEA MEDICATION *UNUSUAL SHORTNESS OF BREATH *UNUSUAL BRUISING OR BLEEDING *URINARY PROBLEMS (pain or burning when urinating, or frequent urination) *BOWEL PROBLEMS (unusual diarrhea, constipation, pain near the anus) TENDERNESS IN MOUTH AND THROAT WITH OR WITHOUT PRESENCE OF ULCERS (sore throat, sores in mouth, or a toothache) UNUSUAL RASH, SWELLING OR PAIN  UNUSUAL VAGINAL DISCHARGE OR ITCHING   Items with * indicate a potential emergency and should be followed up as soon as possible or go to the Emergency Department if any problems should occur.  Please show the CHEMOTHERAPY ALERT CARD or  IMMUNOTHERAPY ALERT CARD at check-in to the Emergency Department and triage nurse.  Should you have questions after your visit or need to cancel or reschedule your appointment, please contact [facility] - A DE[facility]  Dept: [phone]  and follow the prompts.  Office hours are 8:00 a.m. to 4:30 p.m. Monday - Friday. Please note that voicemails left after 4:00 p.m. may not be returned until the following business day.  We are closed weekends and major holidays. You have access to a nurse at all times for urgent questions. Please call the main number to the clinic Dept: [phone] and follow the prompts.   For any non-urgent questions, you may also contact your provider using MyChart. We now offer e-Visits for anyone 4 and older to request care online for non-urgent symptoms. For details visit [email or web address].   Also download the MyChart app! Go to the app store, search "MyChart", open the app, select [place], and log in with your MyChart username and password.

## 2024-01-20 NOTE — Progress Notes (Signed)
 [facility]   Telephone:([phone]) [phone] Fax:(336) [phone]   Clinic Follow up Note   Patient Care Team: [name], MD as PCP - General (Internal Medicine) [name], Aleene PARAS, MD (Inactive) as PCP - Cardiology (Cardiology) Waddell Danelle ORN, MD as PCP - Electrophysiology (Cardiology) Dasie Leonor CROME, MD as Consulting Physician (General Surgery) Lanny Callander, MD as Consulting Physician (Hematology) Rollin Dover, MD as Consulting Physician (Gastroenterology)  Date of Service:  01/20/2024  CHIEF COMPLAINT: f/u of gastric cancer  CURRENT THERAPY:  Nivolumab  240 mg every 2 weeks  Oncology History   Malignant neoplasm of prostate Jon Williams) -diagnosed with Gleason 4+4 prostate cancer in early 2019. He was treated with long-term ADT in combination with 8 weeks of IMRT.   Gastric cancer (HCC) cT3N0M0, stage II -Diagnosed 11/2020, path showed loss of MLH1 and PMS2 which predicts good response to immunotherapy  -Deemed a high risk surgical candidate.  S/p FLOT4 01/02/2021 - 03/22/2021 -Posttreatment EUS 05/2021 and 06/29/2022 by Dr. Rollin showed a scar and residual erythema in stomach, biopsy negative for residual malignancy.  -Jon Williams is clinically doing well on surveillance. Last CT 03/20/22 which shows no evidence of recurrent or metastatic disease, and stable bilateral adrenal adenomas -Surveillance CT scan from October 09, 2022 showed no evidence of recurrence -Surveillance CT scan from April 30, 2023 showed no evidence of residual or recurrent malignancy.  Small liver lesion is stable, favored to be benign. -He unfortunately had local recurrence in 09/2023 -he started Nivo 240mg  q2w and Ipi 1mg /kg q6w (for 2 doses only) on 10/28/2023  Assessment & Plan Gastric cancer of the pyloric antrum on active immunotherapy Gastric cancer of the pyloric antrum is being managed with active immunotherapy. Hemoglobin levels have improved from 11.3 to 12.9, indicating reduced bleeding from the  tumor. He reports no abdominal pain and maintains a stable weight. Appetite is good, and he is tolerating the treatment well. - Continue current immunotherapy regimen. - Scheduled PET scan for December 29th to assess treatment response. - Will consider extending treatment interval to every four weeks if PET scan results are favorable.  Immunotherapy-induced pruritus Intermittent pruritus, likely induced by immunotherapy, occurs occasionally without associated rash. It is managed with topical treatments. - Use Benadryl  cream or hydrocortisone for symptomatic relief of pruritus.  Plan - He is clinically doing well, no pain or other complaints, and tolerating treatment well except mild pruritus - Lab reviewed, adequate for treatment, will proceed nivolumab  today and continue every 2 weeks - He is scheduled for restaging PET scan on February 10, 2024   SUMMARY OF ONCOLOGIC HISTORY: Oncology History Overview Note  Cancer Staging Gastric cancer [facility]) Staging form: Stomach, AJCC 8th Edition - Clinical stage from 12/01/2020: Stage IIB (cT3, cN0, cM0) - Signed by Lanny Callander, MD on 12/20/2020 Stage prefix: Initial diagnosis Total positive nodes: 0  Malignant neoplasm of prostate (HCC) Staging form: Prostate, AJCC 8th Edition - Clinical: Stage IIC (cT2b, cN0, cM0, PSA: 6.7, Grade Group: 4) - Unsigned Prostate specific antigen (PSA) range: Less than 10 Gleason score: 8 Histologic grading system: 5 grade system    Gastric cancer (HCC)  12/01/2020 Procedure   Upper Endoscopy, Dr. Rollin  Impression: - Normal esophagus. 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 Biopsied. - Normal examined duodenum.   12/01/2020 Pathology Results   FINAL MICROSCOPIC DIAGNOSIS:   A. INCISURA, BIOPSY:  - Adenocarcinoma, moderate to poorly differentiated arising in a  background of chronic gastritis with intestinal metaplasia.    12/01/2020 Imaging  CT CAP  IMPRESSION: Focal wall thickening along the  posterior aspect of the gastric antrum, likely corresponding to the patient's newly diagnosed gastric cancer.   No findings suspicious for metastatic disease.   Mild multifocal pneumonia, lower lobe predominant, likely on the basis of aspiration. Trace right pleural effusion.   Fiducial markers along the prostate in this patient with known prostate cancer.   12/01/2020 Cancer Staging   Staging form: Stomach, AJCC 8th Edition - Clinical stage from 12/01/2020: Stage IIB (cT3, cN0, cM0) - Signed by Lanny Callander, MD on 12/20/2020 Stage prefix: Initial diagnosis Total positive nodes: 0   12/08/2020 Initial Diagnosis   Gastric cancer (HCC)   12/16/2020 Procedure   EUS  - Wall thickening was seen in the lesser curve of the stomach and in the antrum of the stomach. The thickening appeared to be primarily within the serosa (Layer 5). T3 N0 Mx. - There was no sign of significant pathology in the entire pancreas. - There was no sign of significant pathology in the common bile duct and in the gallbladder. - There was no evidence of significant pathology in the left lobe of the liver. - Endosonographic images of the left adrenal gland were unremarkable. - The celiac trunk and superior mesenteric artery were endosonographically normal. - The mediastinum was unremarkable endosonographically. - No specimens collected.   01/02/2021 - 03/23/2021 Chemotherapy   Patient is on Treatment Plan : GASTROESOPHAGEAL FLOT q14d X 4 cycles     04/01/2021 Imaging   EXAM: CT ABDOMEN AND PELVIS WITHOUT CONTRAST  IMPRESSION: 1. No acute findings in the abdomen or pelvis. Specifically, no evidence for metastatic disease in the abdomen or pelvis. 2. Stable 16 mm left adrenal adenoma. 3. Small fat containing hernias in the right groin and umbilical region. 4. Bilateral pars interarticularis defects at L4 with 10 mm anterolisthesis of L4 on 5, stable. 5. Aortic Atherosclerosis (ICD10-I70.0).   04/13/2021 Imaging    EXAM: CT CHEST WITHOUT CONTRAST  IMPRESSION: 1. No evidence of metastatic disease in the chest. 2. Enlargement of the main pulmonary artery, as can be seen in pulmonary hypertension. 3. Coronary artery disease.   Aortic Atherosclerosis (ICD10-I70.0).   06/09/2021 Procedure   Upper GI Endoscopy, Dr. Rollin  Findings: -The esophagus was normal. -A medium healed ulcer was found on the lesser curvature of the stomach. Biopsies were taken with a cold forceps for histology. -The examined duodenum was normal. -Compared to the prior EGD there is a marked improvement in his gastric cancer. There was only evidence of erythema and a central scar at the incisura. Cold biopsies of the area were obtained, but there was no gross evidence of malignancy.  Impression: - Normal esophagus. - Scar in the lesser curvature of the stomach. Biopsied. - Normal examined duodenum.   06/09/2021 Pathology Results   FINAL MICROSCOPIC DIAGNOSIS:   A. STOMACH, BIOPSY:  - Gastric mucosa with acute and chronic inflammation.  - No dysplasia or malignancy identified.    06/16/2021 - 06/16/2021 Chemotherapy   Patient is on Treatment Plan : GASTROESOPHAGEAL Pembrolizumab (200) q21d     10/28/2023 -  Chemotherapy   Patient is on Treatment Plan : GASTRIC Nivolumab  (240) D1,15,29 + Ipilimumab  (1) D1 q42d        Discussed the use of AI scribe software for clinical note transcription with the patient, who gave verbal consent to proceed.  History of Present Illness Jon Williams is a 73 year old male with gastric cancer who presents for follow-up.  He is accompanied by his wife.  He has no stomach pain, maintains a good appetite, and his weight is stable at 159 pounds, unchanged from a month ago.  He has intermittent itching on his arms and back that improves with lotion. There is no rash or tenderness, and the itching is not constant.  He recently stopped a heart medication that starts with the letter V, but he  does not recall the name. He experiences occasional night sweats that resolve within half a day. He has no associated stomach pain, rash, or tenderness, and his appetite and weight remain stable.     All other systems were reviewed with the patient and are negative.  MEDICAL HISTORY:  Past Medical History:  Diagnosis Date   AICD (automatic cardioverter/defibrillator) present 05/25/2016   biv icd   Angio-edema    Bunion, right    Chronic combined systolic and diastolic CHF (congestive heart failure) (HCC)    Echo 1/18: Mild conc LVH, EF 15-20, severe diff HK, inf and inf-septal AK, Gr 3 DD, mild to mod Jon, severe LAE, mod reduced RVSF, mod RAE, mild TR, PASP 50   Coronary artery disease involving native coronary artery without angina pectoris 04/17/2016   LHC 1/18: pLCx 30, mLCx 20, mRCA 40, dRCA 20, LVEDP 23, mean RA 8, PA 42/20, PCWP 17   Diabetes mellitus without complication (HCC)    Gastric cancer (HCC)    History of atrial fibrillation    History of atrial flutter    History of cardiomegaly 06/07/2016   Noted on CXR   History of colon polyps 06/28/2017   Noted on colonoscopy   Hypertension    LBBB (left bundle branch block)    Nausea vomiting and diarrhea 04/04/2021   NICM (nonischemic cardiomyopathy) (HCC)    Echo 1/18:  Mild conc LVH, EF 15-20, severe diff HK, inf and inf-septal AK, Gr 3 DD, mild to mod Jon, severe LAE, mod reduced RVSF, mod RAE, mild TR, PASP 50   Other secondary pulmonary hypertension (HCC) 04/17/2016   Prostate cancer (HCC) 2019   Sigmoid diverticulosis 06/28/2017   Noted on colonoscopy    SURGICAL HISTORY: Past Surgical History:  Procedure Laterality Date   BIOPSY  12/01/2020   Procedure: BIOPSY;  Surgeon: Rollin Dover, MD;  Location: [facility] ENDOSCOPY;  Service: Endoscopy;;   BIOPSY  06/09/2021   Procedure: BIOPSY;  Surgeon: Rollin Dover, MD;  Location: [facility] ENDOSCOPY;  Service: Gastroenterology;;   BIOPSY  06/29/2022   Procedure: BIOPSY;  Surgeon:  Rollin Dover, MD;  Location: [facility] ENDOSCOPY;  Service: Gastroenterology;;   BIV ICD INSERTION CRT-D N/A 05/25/2016   Procedure: BiV ICD Insertion CRT-D;  Surgeon: Danelle LELON Birmingham, MD;  Location: [facility] INVASIVE CV LAB;  Service: Cardiovascular;  Laterality: N/A;   CARDIAC CATHETERIZATION N/A 03/02/2016   Procedure: Right/Left Heart Cath and Coronary Angiography;  Surgeon: Lonni Hanson, MD;  Location: [facility] INVASIVE CV LAB;  Service: Cardiovascular;  Laterality: N/A;   CARDIOVERSION N/A 07/17/2016   Procedure: Cardioversion;  Surgeon: Birmingham Danelle LELON, MD;  Location: [facility] INVASIVE CV LAB;  Service: Cardiovascular;  Laterality: N/A;   COLONOSCOPY WITH PROPOFOL  N/A 06/28/2017   Procedure: COLONOSCOPY WITH PROPOFOL ;  Surgeon: Rollin Dover, MD;  Location: [facility] ENDOSCOPY;  Service: Endoscopy;  Laterality: N/A;   colonscopy  2009   ESOPHAGOGASTRODUODENOSCOPY N/A 12/01/2020   Procedure: ESOPHAGOGASTRODUODENOSCOPY (EGD);  Surgeon: Rollin Dover, MD;  Location: [facility] ENDOSCOPY;  Service: Endoscopy;  Laterality: N/A;  IDA/guaiac positive stools   ESOPHAGOGASTRODUODENOSCOPY N/A 10/04/2023  Procedure: EGD (ESOPHAGOGASTRODUODENOSCOPY);  Surgeon: Rollin Dover, MD;  Location: [facility]OSCOPY;  Service: Gastroenterology;  Laterality: N/A;   ESOPHAGOGASTRODUODENOSCOPY (EGD) WITH PROPOFOL  N/A 12/16/2020   Procedure: ESOPHAGOGASTRODUODENOSCOPY (EGD) WITH PROPOFOL ;  Surgeon: Rollin Dover, MD;  Location: [facility] ENDOSCOPY;  Service: Endoscopy;  Laterality: N/A;   ESOPHAGOGASTRODUODENOSCOPY (EGD) WITH PROPOFOL  N/A 06/09/2021   Procedure: ESOPHAGOGASTRODUODENOSCOPY (EGD) WITH PROPOFOL ;  Surgeon: Rollin Dover, MD;  Location: [facility] ENDOSCOPY;  Service: Gastroenterology;  Laterality: N/A;   ESOPHAGOGASTRODUODENOSCOPY (EGD) WITH PROPOFOL  N/A 06/29/2022   Procedure: ESOPHAGOGASTRODUODENOSCOPY (EGD) WITH PROPOFOL ;  Surgeon: Rollin Dover, MD;  Location: [facility] ENDOSCOPY;  Service: Gastroenterology;  Laterality: N/A;   GOLD SEED IMPLANT N/A 12/24/2017    Procedure: GOLD SEED IMPLANT, TRANSERINEAL;  Surgeon: Nieves Cough, MD;  Location: [facility] ORS;  Service: Urology;  Laterality: N/A;   INSERT / REPLACE / REMOVE PACEMAKER     IR ANGIOGRAM SELECTIVE EACH ADDITIONAL VESSEL  11/04/2023   IR ANGIOGRAM SELECTIVE EACH ADDITIONAL VESSEL  11/04/2023   IR CV LINE INJECTION  10/25/2023   IR REMOVAL TUN ACCESS W/ PORT W/O FL MOD SED  11/04/2023   IR US  GUIDE VASC ACCESS RIGHT  11/04/2023   IR VENOCAVAGRAM SVC  11/04/2023   LEAD REVISION  10/10/2018   LEAD REVISION/REPAIR N/A 10/10/2018   Procedure: LEAD REVISION/REPAIR;  Surgeon: Waddell Danelle ORN, MD;  Location: [facility] INVASIVE CV LAB;  Service: Cardiovascular;  Laterality: N/A;   POLYPECTOMY  06/28/2017   Procedure: POLYPECTOMY;  Surgeon: Rollin Dover, MD;  Location: [facility] ENDOSCOPY;  Service: Endoscopy;;  ascending and descending colon polyp   PORTACATH PLACEMENT Right 12/23/2020   Procedure: INSERTION PORT-A-CATH;  Surgeon: Dasie Leonor CROME, MD;  Location: [facility] OR;  Service: General;  Laterality: Right;   PROSTATE BIOPSY  02/20/2017   RIGHT HEART CATH N/A 07/25/2023   Procedure: RIGHT HEART CATH;  Surgeon: Rolan Ezra RAMAN, MD;  Location: [facility] INVASIVE CV LAB;  Service: Cardiovascular;  Laterality: N/A;   RIGHT/LEFT HEART CATH AND CORONARY ANGIOGRAPHY N/A 10/30/2022   Procedure: RIGHT/LEFT HEART CATH AND CORONARY ANGIOGRAPHY;  Surgeon: Rolan Ezra RAMAN, MD;  Location: [facility] INVASIVE CV LAB;  Service: Cardiovascular;  Laterality: N/A;   SPACE OAR INSTILLATION N/A 12/24/2017   Procedure: SPACE OAR INSTILLATION;  Surgeon: Nieves Cough, MD;  Location: [facility] ORS;  Service: Urology;  Laterality: N/A;   TOTAL KNEE ARTHROPLASTY Right 08/16/2017   Procedure: RIGHT TOTAL KNEE ARTHROPLASTY;  Surgeon: Shari Sieving, MD;  Location: [facility] OR;  Service: Orthopedics;  Laterality: Right;   UPPER ESOPHAGEAL ENDOSCOPIC ULTRASOUND (EUS) N/A 12/16/2020   Procedure: UPPER ESOPHAGEAL ENDOSCOPIC ULTRASOUND (EUS);  Surgeon: Rollin Dover, MD;   Location: [facility] ENDOSCOPY;  Service: Endoscopy;  Laterality: N/A;    I have reviewed the social history and family history with the patient and they are unchanged from previous note.  ALLERGIES:  is allergic to aspirin , sacubitril -valsartan , sulfa antibiotics, and ace inhibitors.  MEDICATIONS:  Current Outpatient Medications  Medication Sig Dispense Refill   Accu-Chek Softclix Lancets lancets Use to check your blood sugar finger stick once a day Dx Code E11.9 90 days 100 each 3   amiodarone  (PACERONE ) 200 MG tablet Take 1 tablet (200 mg total) by mouth daily. 90 tablet 3   atorvastatin  (LIPITOR) 40 MG tablet Take 1 tablet (40 mg total) by mouth daily. 90 tablet 3   Biotin 1000 MCG tablet Take 1,000 mcg by mouth daily with breakfast.     Blood Glucose Monitoring Suppl (ACCU-CHEK GUIDE) w/Device KIT as directed to check blood  sugar once a day 1 kit 0   cetirizine  (ZYRTEC ) 10 MG tablet Take 1 tablet (10 mg total) by mouth daily. 30 tablet 0   cholecalciferol  (VITAMIN D3) 25 MCG (1000 UT) tablet Take 1,000 Units by mouth daily with breakfast.     digoxin  (LANOXIN ) 0.125 MG tablet Take 1/2 tablet (0.0625mg  total) by mouth daily. 15 tablet 6   empagliflozin  (JARDIANCE ) 10 MG TABS tablet Take 1 tablet (10 mg total) by mouth daily. 90 tablet 3   EPINEPHrine  0.3 mg/0.3 mL IJ SOAJ injection Inject 0.3 mg into the muscle as needed for anaphylaxis. 2 each 1   ferrous sulfate 325 (65 FE) MG tablet Take 325 mg by mouth daily with breakfast.     furosemide  (LASIX ) 40 MG tablet Take 1 tablet (40 mg total) by mouth every other day. Alternating with 20 mg. 30 tablet 6   gabapentin  (NEURONTIN ) 100 MG capsule Take 1 capsule (100 mg total) by mouth 2 (two) times daily. 180 capsule 1   glucose blood (ACCU-CHEK GUIDE TEST) test strip Use to check blood sugars once daily. 100 each 3   hydrOXYzine  (ATARAX ) 25 MG tablet Take 1/2 tablet (12.5 mg total) by mouth every 8 (eight) hours as needed for up to 6 doses for  itching. 3 tablet 0   ipratropium (ATROVENT ) 0.03 % nasal spray Place 1-2 sprays into both nostrils 2 (two) times daily as needed (nasal drainage). 30 mL 2   lidocaine -prilocaine  (EMLA ) cream Apply 1 Application topically as needed. 30 g 0   lidocaine -prilocaine  (EMLA ) cream Apply to affected area once 30 g 3   metFORMIN  (GLUCOPHAGE -XR) 500 MG 24 hr tablet Take 1 tablet (500 mg total) by mouth daily with evening meal. 90 tablet 3   metoprolol  succinate (TOPROL -XL) 25 MG 24 hr tablet Take 1 tablet (25 mg total) by mouth daily. 90 tablet 3   multivitamin (ONE-A-DAY MEN'S) TABS tablet Take 1 tablet by mouth daily with breakfast.     ondansetron  (ZOFRAN ) 8 MG tablet Take 1 tablet (8 mg total) by mouth every 8 (eight) hours as needed for nausea or vomiting. 30 tablet 1   pantoprazole  (PROTONIX ) 40 MG tablet Take 1 tablet (40 mg total) by mouth daily. 30 tablet 1   pantoprazole  (PROTONIX ) 40 MG tablet Take 1 tablet (40 mg total) by mouth 2 (two) times daily. 180 tablet 3   potassium chloride  (KLOR-CON ) 10 MEQ tablet Take 2 tablets (20 mEq total) by mouth daily. 60 tablet 3   prochlorperazine  (COMPAZINE ) 10 MG tablet Take 1 tablet (10 mg total) by mouth every 6 (six) hours as needed for nausea or vomiting. 30 tablet 1   rivaroxaban  (XARELTO ) 20 MG TABS tablet Take 1 tablet (20 mg total) by mouth daily with supper. 90 tablet 3   spironolactone  (ALDACTONE ) 25 MG tablet Take 0.5 tablets (12.5 mg total) by mouth daily.     sucralfate  (CARAFATE ) 1 GM/10ML suspension Take 10 mLs (1 g total) by mouth 4 (four) times daily -  with meals and at bedtime. 420 mL 1   traMADol  (ULTRAM ) 50 MG tablet Take 1 tablet (50 mg total) by mouth every 6 (six) hours as needed. 60 tablet 0   No current facility-administered medications for this visit.   Facility-Administered Medications Ordered in Other Visits  Medication Dose Route Frequency Provider Last Rate Last Admin   0.9 %  sodium chloride  infusion   Intravenous  Continuous Lanny Callander, MD   Stopped at 01/20/24 1237  PHYSICAL EXAMINATION: ECOG PERFORMANCE STATUS: 1 - Symptomatic but completely ambulatory  Vitals:   01/20/24 1018  BP: 119/79  Pulse: 61  Resp: 18  Temp: 97.8 F (36.6 C)  SpO2: 100%   Wt Readings from Last 3 Encounters:  01/20/24 159 lb 12.8 oz (72.5 kg)  01/06/24 163 lb 3.2 oz (74 kg)  01/03/24 150 lb (68 kg)     GENERAL:alert, no distress and comfortable SKIN: skin color, texture, turgor are normal, no rashes or significant lesions EYES: normal, Conjunctiva are pink and non-injected, sclera clear NECK: supple, thyroid  normal size, non-tender, without nodularity LYMPH:  no palpable lymphadenopathy in the cervical, axillary  LUNGS: clear to auscultation and percussion with normal breathing effort HEART: regular rate & rhythm and no murmurs and no lower extremity edema ABDOMEN:abdomen soft, non-tender and normal bowel sounds Musculoskeletal:no cyanosis of digits and no clubbing  NEURO: alert & oriented x 3 with fluent speech, no focal motor/sensory deficits  Physical Exam   LABORATORY DATA:  I have reviewed the data as listed    Latest Ref Rng & Units 01/20/2024    9:36 AM 01/06/2024   10:55 AM 12/23/2023   10:41 AM  CBC  WBC 4.0 - 10.5 K/uL 6.2  6.2  5.2   Hemoglobin 13.0 - 17.0 g/dL 87.0  87.8  88.6   Hematocrit 39.0 - 52.0 % 40.8  38.5  36.6   Platelets 150 - 400 K/uL 218  200  213         Latest Ref Rng & Units 01/20/2024    9:36 AM 01/06/2024   10:55 AM 12/23/2023   10:41 AM  CMP  Glucose 70 - 99 mg/dL 97  890  899   BUN 8 - 23 mg/dL 29  28  24    Creatinine 0.61 - 1.24 mg/dL 8.76  8.73  8.76   Sodium 135 - 145 mmol/L 141  139  136   Potassium 3.5 - 5.1 mmol/L 4.3  4.5  4.7   Chloride 98 - 111 mmol/L 105  102  103   CO2 22 - 32 mmol/L 25  26  28    Calcium  8.9 - 10.3 mg/dL 9.9  9.4  9.2   Total Protein 6.5 - 8.1 g/dL 7.6  7.3  6.9   Total Bilirubin 0.0 - 1.2 mg/dL 0.5  0.5  0.5   Alkaline Phos 38  - 126 U/L 69  69  52   AST 15 - 41 U/L 32  30  25   ALT 0 - 44 U/L 42  36  30       RADIOGRAPHIC STUDIES: I have personally reviewed the radiological images as listed and agreed with the findings in the report. No results found.    Orders Placed This Encounter  Procedures   CBC with Differential (Cancer Center Only)    Standing Status:   Future    Expected Date:   03/02/2024    Expiration Date:   03/02/2025   CMP (Cancer Center only)    Standing Status:   Future    Expected Date:   03/02/2024    Expiration Date:   03/02/2025   T4    Standing Status:   Future    Expected Date:   03/02/2024    Expiration Date:   03/02/2025   TSH    Standing Status:   Future    Expected Date:   03/02/2024    Expiration Date:   03/02/2025   CBC with Differential (  Cancer Center Only)    Standing Status:   Future    Expected Date:   03/16/2024    Expiration Date:   03/16/2025   CMP (Cancer Center only)    Standing Status:   Future    Expected Date:   03/16/2024    Expiration Date:   03/16/2025   CBC with Differential (Cancer Center Only)    Standing Status:   Future    Expected Date:   03/30/2024    Expiration Date:   03/30/2025   CMP (Cancer Center only)    Standing Status:   Future    Expected Date:   03/30/2024    Expiration Date:   03/30/2025   All questions were answered. The patient knows to call the clinic with any problems, questions or concerns. No barriers to learning was detected. The total time spent in the appointment was 25 minutes, including review of chart and various tests results, discussions about plan of care and coordination of care plan     Onita Mattock, MD 01/20/2024

## 2024-01-21 LAB — T4: T4, Total: 10.2 ug/dL (ref 4.5–12.0)

## 2024-01-24 ENCOUNTER — Other Ambulatory Visit (HOSPITAL_COMMUNITY): Payer: Self-pay

## 2024-02-03 NOTE — Progress Notes (Unsigned)
 " Patient Care Team: Jon Ingle, MD as PCP - General (Internal Medicine) [name], Jon PARAS, MD (Inactive) as PCP - Cardiology (Cardiology) Williams Jon ORN, MD as PCP - Electrophysiology (Cardiology) Dasie Jon CROME, MD as Consulting Physician (General Surgery) Jon Callander, MD as Consulting Physician (Hematology) Rollin Dover, MD as Consulting Physician (Gastroenterology)  Clinic Day:  02/04/2024  Referring physician: Lanny Callander, MD  ASSESSMENT & PLAN:   Assessment & Plan: Gastric cancer (HCC) cT3N0M0, stage II -Diagnosed 11/2020, path showed loss of MLH1 and PMS2 which predicts good response to immunotherapy  -Deemed a high risk surgical candidate.  S/p FLOT4 01/02/2021 - 03/22/2021 -Posttreatment EUS 05/2021 and 06/29/2022 by Dr. Rollin showed a scar and residual erythema in stomach, biopsy negative for residual malignancy.  -Mr Bartko is clinically doing well on surveillance. Last CT 03/20/22 which shows no evidence of recurrent or metastatic disease, and stable bilateral adrenal adenomas -Surveillance CT scan from October 09, 2022 showed no evidence of recurrence -Surveillance CT scan from April 30, 2023 showed no evidence of residual or recurrent malignancy.  Small liver lesion is stable, favored to be benign. -He unfortunately had local recurrence in 09/2023 -he started Nivo 240mg  q2w and Ipi 1mg /kg q6w (for 2 doses only) on 10/28/2023 -02/04/2024 -tolerating treatment with nivolumab  well.  Today, he presents for cycle 3 day 15 without complaint.  PET scan scheduled on 02/10/2024.  Will follow-up on 02/17/2024 as scheduled.   Gastric cancer Patient presents for cycle 3 day 15 immunotherapy nivolumab  today.  Tolerating well.  Reports mild itchiness to his skin on the back.  States he has had good appetite.  Denies constipation or diarrhea.  Denies belly pain.  Trying to build strength.  Walking more.  Increased activity.  Scheduled for PET scan on 03/13/2023.  Plan  Labs reviewed. - Overall  stable CBC and CMP. Tolerating treatment with nivolumab  well. Labs and patient presentation are appropriate for treatment today. Proceed with cycle 3 day 15 nivolumab . PET scan is scheduled on 02/10/2024. Labs, PICC line maintenance, follow-up, and next treatment as scheduled on 02/17/2024.  The patient understands the plans discussed today and is in agreement with them.  He knows to contact our office if he develops concerns prior to his next appointment.  I provided 20 minutes of face-to-face time during this encounter and > 50% was spent counseling as documented under my assessment and plan.    Jon FORBES Lessen, NP   [facility] - A DEPT OF [facility] [address] [place] [address] Dept: [phone] Dept Fax: [phone]   No orders of the defined types were placed in this encounter.     CHIEF COMPLAINT:  CC: Gastric cancer  Current Treatment:  nivolumab  240 mg every 2 weeks   INTERVAL HISTORY:  Jon Williams is here today for repeat clinical assessment. He last Saw Dr. Lanny 01/20/2024.  Today, he presents for cycle 3 day 15 nivolumab .  Overall, he reports tolerating treatment well.  Has not had any constipation since initially started on nivolumab /ipilimumab .  States some days he will have multiple bowel movements in 1 day.  Denies diarrhea.  Stools are formed.  Denies belly pain.  He reports trying to build strength by walking more often.  He has increased physical activity.  He denies chest pain, chest pressure, or shortness of breath. He denies headaches or visual disturbances. He denies abdominal pain, nausea, vomiting, or changes in bowel or bladder  habits.  He denies fevers or chills. He denies pain. His appetite is good. His weight has increased 6 pounds over last 3 weeks.  I have reviewed the past medical history, past surgical history, social history and family history with the patient and they are unchanged  from previous note.  ALLERGIES:  is allergic to aspirin , sacubitril -valsartan , sulfa antibiotics, and ace inhibitors.  MEDICATIONS:  Current Outpatient Medications  Medication Sig Dispense Refill   Accu-Chek Softclix Lancets lancets Use to check your blood sugar finger stick once a day Dx Code E11.9 90 days 100 each 3   amiodarone  (PACERONE ) 200 MG tablet Take 1 tablet (200 mg total) by mouth daily. 90 tablet 3   atorvastatin  (LIPITOR) 40 MG tablet Take 1 tablet (40 mg total) by mouth daily. 90 tablet 3   Biotin 1000 MCG tablet Take 1,000 mcg by mouth daily with breakfast.     Blood Glucose Monitoring Suppl (ACCU-CHEK GUIDE) w/Device KIT as directed to check blood sugar once a day 1 kit 0   cetirizine  (ZYRTEC ) 10 MG tablet Take 1 tablet (10 mg total) by mouth daily. 30 tablet 0   cholecalciferol  (VITAMIN D3) 25 MCG (1000 UT) tablet Take 1,000 Units by mouth daily with breakfast.     digoxin  (LANOXIN ) 0.125 MG tablet Take 1/2 tablet (0.0625mg  total) by mouth daily. 15 tablet 6   empagliflozin  (JARDIANCE ) 10 MG TABS tablet Take 1 tablet (10 mg total) by mouth daily. 90 tablet 3   EPINEPHrine  0.3 mg/0.3 mL IJ SOAJ injection Inject 0.3 mg into the muscle as needed for anaphylaxis. 2 each 1   ferrous sulfate 325 (65 FE) MG tablet Take 325 mg by mouth daily with breakfast.     furosemide  (LASIX ) 40 MG tablet Take 1 tablet (40 mg total) by mouth every other day. Alternating with 20 mg. 30 tablet 6   gabapentin  (NEURONTIN ) 100 MG capsule Take 1 capsule (100 mg total) by mouth 2 (two) times daily. 180 capsule 1   glucose blood (ACCU-CHEK GUIDE TEST) test strip Use to check blood sugars once daily. 100 each 3   hydrOXYzine  (ATARAX ) 25 MG tablet Take 1/2 tablet (12.5 mg total) by mouth every 8 (eight) hours as needed for up to 6 doses for itching. 3 tablet 0   ipratropium (ATROVENT ) 0.03 % nasal spray Place 1-2 sprays into both nostrils 2 (two) times daily as needed (nasal drainage). 30 mL 2    lidocaine -prilocaine  (EMLA ) cream Apply 1 Application topically as needed. 30 g 0   lidocaine -prilocaine  (EMLA ) cream Apply to affected area once 30 g 3   metFORMIN  (GLUCOPHAGE -XR) 500 MG 24 hr tablet Take 1 tablet (500 mg total) by mouth daily with evening meal. 90 tablet 3   metoprolol  succinate (TOPROL -XL) 25 MG 24 hr tablet Take 1 tablet (25 mg total) by mouth daily. 90 tablet 3   multivitamin (ONE-A-DAY MEN'S) TABS tablet Take 1 tablet by mouth daily with breakfast.     ondansetron  (ZOFRAN ) 8 MG tablet Take 1 tablet (8 mg total) by mouth every 8 (eight) hours as needed for nausea or vomiting. 30 tablet 1   pantoprazole  (PROTONIX ) 40 MG tablet Take 1 tablet (40 mg total) by mouth daily. 30 tablet 1   pantoprazole  (PROTONIX ) 40 MG tablet Take 1 tablet (40 mg total) by mouth 2 (two) times daily. 180 tablet 3   potassium chloride  (KLOR-CON ) 10 MEQ tablet Take 2 tablets (20 mEq total) by mouth daily. 60 tablet 3   prochlorperazine  (COMPAZINE )  10 MG tablet Take 1 tablet (10 mg total) by mouth every 6 (six) hours as needed for nausea or vomiting. 30 tablet 1   rivaroxaban  (XARELTO ) 20 MG TABS tablet Take 1 tablet (20 mg total) by mouth daily with supper. 90 tablet 3   spironolactone  (ALDACTONE ) 25 MG tablet Take 0.5 tablets (12.5 mg total) by mouth daily.     sucralfate  (CARAFATE ) 1 GM/10ML suspension Take 10 mLs (1 g total) by mouth 4 (four) times daily -  with meals and at bedtime. 420 mL 1   traMADol  (ULTRAM ) 50 MG tablet Take 1 tablet (50 mg total) by mouth every 6 (six) hours as needed. 60 tablet 0   No current facility-administered medications for this visit.   Facility-Administered Medications Ordered in Other Visits  Medication Dose Route Frequency Provider Last Rate Last Admin   0.9 %  sodium chloride  infusion   Intravenous Continuous Jon Callander, MD 10 mL/hr at 02/04/24 1038 New Bag at 02/04/24 1038   nivolumab  (OPDIVO ) 240 mg in sodium chloride  0.9 % 100 mL chemo infusion  240 mg  Intravenous Once Jon Callander, MD        HISTORY OF PRESENT ILLNESS:   Oncology History Overview Note  Cancer Staging Gastric cancer [facility]) Staging form: Stomach, AJCC 8th Edition - Clinical stage from 12/01/2020: Stage IIB (cT3, cN0, cM0) - Signed by Jon Callander, MD on 12/20/2020 Stage prefix: Initial diagnosis Total positive nodes: 0  Malignant neoplasm of prostate (HCC) Staging form: Prostate, AJCC 8th Edition - Clinical: Stage IIC (cT2b, cN0, cM0, PSA: 6.7, Grade Group: 4) - Unsigned Prostate specific antigen (PSA) range: Less than 10 Gleason score: 8 Histologic grading system: 5 grade system    Gastric cancer (HCC)  12/01/2020 Procedure   Upper Endoscopy, Dr. Rollin  Impression: - Normal esophagus. 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 Biopsied. - Normal examined duodenum.   12/01/2020 Pathology Results   FINAL MICROSCOPIC DIAGNOSIS:   A. INCISURA, BIOPSY:  - Adenocarcinoma, moderate to poorly differentiated arising in a  background of chronic gastritis with intestinal metaplasia.    12/01/2020 Imaging   CT CAP  IMPRESSION: Focal wall thickening along the posterior aspect of the gastric antrum, likely corresponding to the patient's newly diagnosed gastric cancer.   No findings suspicious for metastatic disease.   Mild multifocal pneumonia, lower lobe predominant, likely on the basis of aspiration. Trace right pleural effusion.   Fiducial markers along the prostate in this patient with known prostate cancer.   12/01/2020 Cancer Staging   Staging form: Stomach, AJCC 8th Edition - Clinical stage from 12/01/2020: Stage IIB (cT3, cN0, cM0) - Signed by Jon Callander, MD on 12/20/2020 Stage prefix: Initial diagnosis Total positive nodes: 0   12/08/2020 Initial Diagnosis   Gastric cancer (HCC)   12/16/2020 Procedure   EUS  - Wall thickening was seen in the lesser curve of the stomach and in the antrum of the stomach. The thickening appeared to be primarily within  the serosa (Layer 5). T3 N0 Mx. - There was no sign of significant pathology in the entire pancreas. - There was no sign of significant pathology in the common bile duct and in the gallbladder. - There was no evidence of significant pathology in the left lobe of the liver. - Endosonographic images of the left adrenal gland were unremarkable. - The celiac trunk and superior mesenteric artery were endosonographically normal. - The mediastinum was unremarkable endosonographically. - No specimens collected.   01/02/2021 - 03/23/2021 Chemotherapy  Patient is on Treatment Plan : GASTROESOPHAGEAL FLOT q14d X 4 cycles     04/01/2021 Imaging   EXAM: CT ABDOMEN AND PELVIS WITHOUT CONTRAST  IMPRESSION: 1. No acute findings in the abdomen or pelvis. Specifically, no evidence for metastatic disease in the abdomen or pelvis. 2. Stable 16 mm left adrenal adenoma. 3. Small fat containing hernias in the right groin and umbilical region. 4. Bilateral pars interarticularis defects at L4 with 10 mm anterolisthesis of L4 on 5, stable. 5. Aortic Atherosclerosis (ICD10-I70.0).   04/13/2021 Imaging   EXAM: CT CHEST WITHOUT CONTRAST  IMPRESSION: 1. No evidence of metastatic disease in the chest. 2. Enlargement of the main pulmonary artery, as can be seen in pulmonary hypertension. 3. Coronary artery disease.   Aortic Atherosclerosis (ICD10-I70.0).   06/09/2021 Procedure   Upper GI Endoscopy, Dr. Rollin  Findings: -The esophagus was normal. -A medium healed ulcer was found on the lesser curvature of the stomach. Biopsies were taken with a cold forceps for histology. -The examined duodenum was normal. -Compared to the prior EGD there is a marked improvement in his gastric cancer. There was only evidence of erythema and a central scar at the incisura. Cold biopsies of the area were obtained, but there was no gross evidence of malignancy.  Impression: - Normal esophagus. - Scar in the lesser curvature  of the stomach. Biopsied. - Normal examined duodenum.   06/09/2021 Pathology Results   FINAL MICROSCOPIC DIAGNOSIS:   A. STOMACH, BIOPSY:  - Gastric mucosa with acute and chronic inflammation.  - No dysplasia or malignancy identified.    06/16/2021 - 06/16/2021 Chemotherapy   Patient is on Treatment Plan : GASTROESOPHAGEAL Pembrolizumab (200) q21d     10/28/2023 -  Chemotherapy   Patient is on Treatment Plan : GASTRIC Nivolumab  (240) D1,15,29 + Ipilimumab  (1) D1 q42d         REVIEW OF SYSTEMS:   Constitutional: Denies fevers, chills or abnormal weight loss Eyes: Denies blurriness of vision Ears, nose, mouth, throat, and face: Denies mucositis or sore throat Respiratory: Denies cough, dyspnea or wheezes Cardiovascular: Denies palpitation, chest discomfort or lower extremity swelling Gastrointestinal:  Denies nausea, heartburn or change in bowel habits Skin: Denies abnormal skin rashes Lymphatics: Denies new lymphadenopathy or easy bruising Neurological:Denies numbness, tingling or new weaknesses Behavioral/Psych: Mood is stable, no new changes  All other systems were reviewed with the patient and are negative.   VITALS:   Today's Vitals   02/04/24 1005 02/04/24 1008  BP: 104/66   Pulse: 63   Resp: 17   Temp: 97.6 F (36.4 C)   SpO2: 99%   Weight: 165 lb 9.6 oz (75.1 kg)   PainSc:  0-No pain   Body mass index is 26.73 kg/m.   Wt Readings from Last 3 Encounters:  02/04/24 165 lb 9.6 oz (75.1 kg)  01/20/24 159 lb 12.8 oz (72.5 kg)  01/06/24 163 lb 3.2 oz (74 kg)    Body mass index is 26.73 kg/m.  Performance status (ECOG): 1 - Symptomatic but completely ambulatory  PHYSICAL EXAM:   GENERAL:alert, no distress and comfortable SKIN: skin color, texture, turgor are normal, no rashes or significant lesions EYES: normal, Conjunctiva are pink and non-injected, sclera clear OROPHARYNX:no exudate, no erythema and lips, buccal mucosa, and tongue normal  NECK: supple,  thyroid  normal size, non-tender, without nodularity LYMPH:  no palpable lymphadenopathy in the cervical, axillary or inguinal LUNGS: clear to auscultation and percussion with normal breathing effort HEART: regular rate & rhythm  and no murmurs and no lower extremity edema ABDOMEN:abdomen soft, non-tender and normal bowel sounds Musculoskeletal:no cyanosis of digits and no clubbing  NEURO: alert & oriented x 3 with fluent speech, no focal motor/sensory deficits  LABORATORY DATA:  I have reviewed the data as listed    Component Value Date/Time   NA 141 02/04/2024 0908   NA 141 10/08/2018 0756   K 4.5 02/04/2024 0908   CL 105 02/04/2024 0908   CO2 24 02/04/2024 0908   GLUCOSE 102 (H) 02/04/2024 0908   BUN 26 (H) 02/04/2024 0908   BUN 19 10/08/2018 0756   CREATININE 1.29 (H) 02/04/2024 0908   CALCIUM  9.6 02/04/2024 0908   PROT 7.4 02/04/2024 0908   PROT 7.0 07/31/2023 1047   ALBUMIN 4.6 02/04/2024 0908   ALBUMIN 4.6 06/06/2016 1453   AST 34 02/04/2024 0908   ALT 45 (H) 02/04/2024 0908   ALKPHOS 63 02/04/2024 0908   BILITOT 0.6 02/04/2024 0908   GFRNONAA 58 (L) 02/04/2024 0908   GFRAA 74 10/08/2018 0756    Lab Results  Component Value Date   WBC 7.0 02/04/2024   NEUTROABS 5.4 02/04/2024   HGB 12.7 (L) 02/04/2024   HCT 39.3 02/04/2024   MCV 96.1 02/04/2024   PLT 190 02/04/2024     RADIOGRAPHIC STUDIES: CUP PACEART REMOTE DEVICE CHECK Result Date: 01/10/2024 ICD Scheduled remote reviewed. Normal device function.  Presenting rhythm:  AF/VP Known AF, controlled rates, Xarelto  per EPIC Next remote transmission per protocol. LA, CVRS  "

## 2024-02-04 ENCOUNTER — Inpatient Hospital Stay

## 2024-02-04 ENCOUNTER — Inpatient Hospital Stay (HOSPITAL_BASED_OUTPATIENT_CLINIC_OR_DEPARTMENT_OTHER): Admitting: Nurse Practitioner

## 2024-02-04 ENCOUNTER — Encounter: Payer: Self-pay | Admitting: Nurse Practitioner

## 2024-02-04 VITALS — BP 104/66 | HR 63 | Temp 97.6°F | Resp 17 | Wt 165.6 lb

## 2024-02-04 VITALS — BP 112/68 | HR 60

## 2024-02-04 DIAGNOSIS — C163 Malignant neoplasm of pyloric antrum: Secondary | ICD-10-CM

## 2024-02-04 DIAGNOSIS — Z5112 Encounter for antineoplastic immunotherapy: Secondary | ICD-10-CM | POA: Diagnosis not present

## 2024-02-04 LAB — CBC WITH DIFFERENTIAL (CANCER CENTER ONLY)
Abs Immature Granulocytes: 0.02 K/uL (ref 0.00–0.07)
Basophils Absolute: 0 K/uL (ref 0.0–0.1)
Basophils Relative: 0 %
Eosinophils Absolute: 0.1 K/uL (ref 0.0–0.5)
Eosinophils Relative: 1 %
HCT: 39.3 % (ref 39.0–52.0)
Hemoglobin: 12.7 g/dL — ABNORMAL LOW (ref 13.0–17.0)
Immature Granulocytes: 0 %
Lymphocytes Relative: 12 %
Lymphs Abs: 0.8 K/uL (ref 0.7–4.0)
MCH: 31.1 pg (ref 26.0–34.0)
MCHC: 32.3 g/dL (ref 30.0–36.0)
MCV: 96.1 fL (ref 80.0–100.0)
Monocytes Absolute: 0.7 K/uL (ref 0.1–1.0)
Monocytes Relative: 10 %
Neutro Abs: 5.4 K/uL (ref 1.7–7.7)
Neutrophils Relative %: 77 %
Platelet Count: 190 K/uL (ref 150–400)
RBC: 4.09 MIL/uL — ABNORMAL LOW (ref 4.22–5.81)
RDW: 18.7 % — ABNORMAL HIGH (ref 11.5–15.5)
WBC Count: 7 K/uL (ref 4.0–10.5)
nRBC: 0 % (ref 0.0–0.2)

## 2024-02-04 LAB — CMP (CANCER CENTER ONLY)
ALT: 45 U/L — ABNORMAL HIGH (ref 0–44)
AST: 34 U/L (ref 15–41)
Albumin: 4.6 g/dL (ref 3.5–5.0)
Alkaline Phosphatase: 63 U/L (ref 38–126)
Anion gap: 11 (ref 5–15)
BUN: 26 mg/dL — ABNORMAL HIGH (ref 8–23)
CO2: 24 mmol/L (ref 22–32)
Calcium: 9.6 mg/dL (ref 8.9–10.3)
Chloride: 105 mmol/L (ref 98–111)
Creatinine: 1.29 mg/dL — ABNORMAL HIGH (ref 0.61–1.24)
GFR, Estimated: 58 mL/min — ABNORMAL LOW
Glucose, Bld: 102 mg/dL — ABNORMAL HIGH (ref 70–99)
Potassium: 4.5 mmol/L (ref 3.5–5.1)
Sodium: 141 mmol/L (ref 135–145)
Total Bilirubin: 0.6 mg/dL (ref 0.0–1.2)
Total Protein: 7.4 g/dL (ref 6.5–8.1)

## 2024-02-04 MED ORDER — SODIUM CHLORIDE 0.9 % IV SOLN: INTRAVENOUS | Status: DC

## 2024-02-04 MED ORDER — SODIUM CHLORIDE 0.9 % IV SOLN
240.0000 mg | Freq: Once | INTRAVENOUS | Status: AC
  Administered 2024-02-04: 240 mg via INTRAVENOUS
  Filled 2024-02-04: qty 24

## 2024-02-04 NOTE — Patient Instructions (Signed)
 [facility] - A DEPT OF [facility]  Discharge Instructions: Thank you for choosing [facility] to provide your oncology and hematology care.   If you have a lab appointment with the Cancer Center, please go directly to the Cancer Center and check in at the registration area.   Wear comfortable clothing and clothing appropriate for easy access to any Portacath or PICC line.   We strive to give you quality time with your provider. You may need to reschedule your appointment if you arrive late (15 or more minutes).  Arriving late affects you and other patients whose appointments are after yours.  Also, if you miss three or more appointments without notifying the office, you may be dismissed from the clinic at the provider's discretion.      For prescription refill requests, have your pharmacy contact our office and allow 72 hours for refills to be completed.    Today you received the following chemotherapy and/or immunotherapy agents: Opdivo    To help prevent nausea and vomiting after your treatment, we encourage you to take your nausea medication as directed.  BELOW ARE SYMPTOMS THAT SHOULD BE REPORTED IMMEDIATELY: *FEVER GREATER THAN 100.4 F (38 C) OR HIGHER *CHILLS OR SWEATING *NAUSEA AND VOMITING THAT IS NOT CONTROLLED WITH YOUR NAUSEA MEDICATION *UNUSUAL SHORTNESS OF BREATH *UNUSUAL BRUISING OR BLEEDING *URINARY PROBLEMS (pain or burning when urinating, or frequent urination) *BOWEL PROBLEMS (unusual diarrhea, constipation, pain near the anus) TENDERNESS IN MOUTH AND THROAT WITH OR WITHOUT PRESENCE OF ULCERS (sore throat, sores in mouth, or a toothache) UNUSUAL RASH, SWELLING OR PAIN  UNUSUAL VAGINAL DISCHARGE OR ITCHING   Items with * indicate a potential emergency and should be followed up as soon as possible or go to the Emergency Department if any problems should occur.  Please show the CHEMOTHERAPY ALERT CARD or IMMUNOTHERAPY ALERT  CARD at check-in to the Emergency Department and triage nurse.  Should you have questions after your visit or need to cancel or reschedule your appointment, please contact [facility] - A DEPT OF [facility]  Dept: [phone]  and follow the prompts.  Office hours are 8:00 a.m. to 4:30 p.m. Monday - Friday. Please note that voicemails left after 4:00 p.m. may not be returned until the following business day.  We are closed weekends and major holidays. You have access to a nurse at all times for urgent questions. Please call the main number to the clinic Dept: [phone] and follow the prompts.   For any non-urgent questions, you may also contact your provider using MyChart. We now offer e-Visits for anyone 24 and older to request care online for non-urgent symptoms. For details visit [email or web address].   Also download the MyChart app! Go to the app store, search MyChart, open the app, select [facility], and log in with your MyChart username and password.

## 2024-02-04 NOTE — Assessment & Plan Note (Addendum)
 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  S/p FLOT4 01/02/2021 - 03/22/2021 -Posttreatment EUS 05/2021 and 06/29/2022 by Dr. Rollin showed a scar and residual erythema in stomach, biopsy negative for residual malignancy.  -Jon Williams is clinically doing well on surveillance. Last CT 03/20/22 which shows no evidence of recurrent or metastatic disease, and stable bilateral adrenal adenomas -Surveillance CT scan from October 09, 2022 showed no evidence of recurrence -Surveillance CT scan from April 30, 2023 showed no evidence of residual or recurrent malignancy.  Small liver lesion is stable, favored to be benign. -He unfortunately had local recurrence in 09/2023 -he started Nivo 240mg  q2w and Ipi 1mg /kg q6w (for 2 doses only) on 10/28/2023 -02/04/2024 -tolerating treatment with nivolumab  well.  Today, he presents for cycle 3 day 15 without complaint.  PET scan scheduled on 02/10/2024.  Will follow-up on 02/17/2024 as scheduled.

## 2024-02-10 ENCOUNTER — Other Ambulatory Visit (HOSPITAL_COMMUNITY): Payer: Self-pay

## 2024-02-10 ENCOUNTER — Encounter (HOSPITAL_COMMUNITY)
Admission: RE | Admit: 2024-02-10 | Discharge: 2024-02-10 | Disposition: A | Discharge status: Home / Self Care | Source: Ambulatory Visit | Attending: Hematology | Admitting: Hematology

## 2024-02-10 ENCOUNTER — Other Ambulatory Visit: Payer: Self-pay

## 2024-02-10 DIAGNOSIS — C163 Malignant neoplasm of pyloric antrum: Secondary | ICD-10-CM | POA: Insufficient documentation

## 2024-02-10 LAB — GLUCOSE, CAPILLARY: Glucose-Capillary: 104 mg/dL — ABNORMAL HIGH (ref 70–99)

## 2024-02-10 MED ORDER — FLUDEOXYGLUCOSE F - 18 (FDG) INJECTION
7.0000 | Freq: Once | INTRAVENOUS | Status: AC
  Administered 2024-02-10: 8.11 via INTRAVENOUS

## 2024-02-11 ENCOUNTER — Other Ambulatory Visit (HOSPITAL_COMMUNITY): Payer: Self-pay

## 2024-02-12 ENCOUNTER — Other Ambulatory Visit (HOSPITAL_COMMUNITY): Payer: Self-pay

## 2024-02-12 MED ORDER — SPIRONOLACTONE 25 MG PO TABS
12.5000 mg | ORAL_TABLET | Freq: Every day | ORAL | 1 refills | Status: DC
  Filled 2024-02-12: qty 15, 30d supply, fill #0
  Filled 2024-03-06: qty 15, 30d supply, fill #1

## 2024-02-13 ENCOUNTER — Encounter: Payer: Self-pay | Admitting: Hematology

## 2024-02-16 NOTE — Progress Notes (Signed)
 " Patient Care Team: [name], MD as PCP - General (Internal Medicine) [name], Aleene PARAS, MD (Inactive) as PCP - Cardiology (Cardiology) Waddell Danelle ORN, MD as PCP - Electrophysiology (Cardiology) Dasie Leonor CROME, MD as Consulting Physician (General Surgery) Lanny Callander, MD as Consulting Physician (Hematology) Rollin Dover, MD as Consulting Physician (Gastroenterology)  Clinic Day:  02/17/2024  Referring physician: Lanny Callander, MD  ASSESSMENT & PLAN:   Assessment & Plan: Gastric cancer (HCC) cT3N0M0, stage II -Diagnosed 11/2020, path showed loss of MLH1 and PMS2 which predicts good response to immunotherapy  -Deemed a high risk surgical candidate.  S/p FLOT4 01/02/2021 - 03/22/2021 -Posttreatment EUS 05/2021 and 06/29/2022 by Dr. Rollin showed a scar and residual erythema in stomach, biopsy negative for residual malignancy.  -Mr Dolecki is clinically doing well on surveillance. Last CT 03/20/22 which shows no evidence of recurrent or metastatic disease, and stable bilateral adrenal adenomas -Surveillance CT scan from October 09, 2022 showed no evidence of recurrence -Surveillance CT scan from April 30, 2023 showed no evidence of residual or recurrent malignancy.  Small liver lesion is stable, favored to be benign. -He unfortunately had local recurrence in 09/2023 -he started Nivo 240mg  q2w and Ipi 1mg /kg q6w (for 2 doses only) on 10/28/2023 -02/04/2024 -tolerating treatment with nivolumab  well.  Today, he presents for cycle 3 day 15 without complaint.  PET scan scheduled on 02/10/2024.  Will follow-up on 02/17/2024 as scheduled. - 02/17/2024 -awaiting results of PET scan done 02/10/2024.  Call to radiology reading room today prior to today's visit.  Labs and patient presentation are appropriate for treatment and will proceed as scheduled.   Decreased renal functions BUN 34 and creatinine 1.46.  eGFR decreased to 50.  Recommend aggressive oral hydration.  Patient reports good water intake.  Recheck in 2  weeks prior to next treatment with nivolumab .  Gastric cancer Patient currently receiving nivolumab  240 mg every 2 weeks.  Continues to have intermittent cold sweats.  This has been going on prior to treatment with nivolumab .  States that frequency of cold sweats is improving.  He states he has got good appetite.  Denies abdominal pain.  He did have repeat PET scan on 02/10/2024.  Results are still pending at this time.  Call to radiology reading room mate prior to today's visit.  Will discuss results at next visit and plan for future treatment as indicated.  Plan Labs reviewed. - Unremarkable CBC.  Ferritin normal at 63. - CMP showing decreased renal functions.  Recommend aggressive oral hydration.  Recheck in 2 weeks. Awaiting results of PET scan, done on 02/10/2024. Labs and patient presentation are appropriate for treatment today. - Proceed with nivolumab  240 mg. Labs/flush, follow-up, and next treatment as scheduled.  The patient understands the plans discussed today and is in agreement with them.  He knows to contact our office if he develops concerns prior to his next appointment.  I provided 25 minutes of face-to-face time during this encounter and > 50% was spent counseling as documented under my assessment and plan.    Powell FORBES Lessen, NP  [facility] - A DEPT OF [facility] [address] [place] [address] Dept: [phone] Dept Fax: [phone]   No orders of the defined types were placed in this encounter.     CHIEF COMPLAINT:  CC: Malignant neoplasm of pyloric antrum  Current Treatment: Nivolumab  240 mg every 2 weeks  INTERVAL HISTORY:  Denis is here today for repeat clinical assessment.  He last saw me on 02/04/2024.  Today, he presents for cycle 3 day 29 of treatment with nivolumab .  He did have PET scan on 02/10/2024.  Awaiting results.  He does report intermittent sweats.  These have been  going on since prior to immunotherapy nivolumab .  These are improving in frequency and severity.  He denies chest pain, chest pressure, or shortness of breath. He denies headaches or visual disturbances. He denies abdominal pain, nausea, vomiting, or changes in bowel or bladder habits.  He denies fevers or chills. He denies pain. His appetite is good. His weight has been stable.  I have reviewed the past medical history, past surgical history, social history and family history with the patient and they are unchanged from previous note.  ALLERGIES:  is allergic to aspirin , sacubitril -valsartan , sulfa antibiotics, and ace inhibitors.  MEDICATIONS:  Current Outpatient Medications  Medication Sig Dispense Refill   Accu-Chek Softclix Lancets lancets Use to check your blood sugar finger stick once a day Dx Code E11.9 90 days 100 each 3   amiodarone  (PACERONE ) 200 MG tablet Take 1 tablet (200 mg total) by mouth daily. 90 tablet 3   atorvastatin  (LIPITOR) 40 MG tablet Take 1 tablet (40 mg total) by mouth daily. 90 tablet 3   Biotin 1000 MCG tablet Take 1,000 mcg by mouth daily with breakfast.     Blood Glucose Monitoring Suppl (ACCU-CHEK GUIDE) w/Device KIT as directed to check blood sugar once a day 1 kit 0   cetirizine  (ZYRTEC ) 10 MG tablet Take 1 tablet (10 mg total) by mouth daily. 30 tablet 0   cholecalciferol  (VITAMIN D3) 25 MCG (1000 UT) tablet Take 1,000 Units by mouth daily with breakfast.     digoxin  (LANOXIN ) 0.125 MG tablet Take 1/2 tablet (0.0625mg  total) by mouth daily. 15 tablet 6   empagliflozin  (JARDIANCE ) 10 MG TABS tablet Take 1 tablet (10 mg total) by mouth daily. 90 tablet 3   EPINEPHrine  0.3 mg/0.3 mL IJ SOAJ injection Inject 0.3 mg into the muscle as needed for anaphylaxis. 2 each 1   ferrous sulfate 325 (65 FE) MG tablet Take 325 mg by mouth daily with breakfast.     furosemide  (LASIX ) 40 MG tablet Take 1 tablet (40 mg total) by mouth every other day. Alternating with 20 mg. 30  tablet 6   gabapentin  (NEURONTIN ) 100 MG capsule Take 1 capsule (100 mg total) by mouth 2 (two) times daily. 180 capsule 1   glucose blood (ACCU-CHEK GUIDE TEST) test strip Use to check blood sugars once daily. 100 each 3   hydrOXYzine  (ATARAX ) 25 MG tablet Take 1/2 tablet (12.5 mg total) by mouth every 8 (eight) hours as needed for up to 6 doses for itching. 3 tablet 0   ipratropium (ATROVENT ) 0.03 % nasal spray Place 1-2 sprays into both nostrils 2 (two) times daily as needed (nasal drainage). 30 mL 2   lidocaine -prilocaine  (EMLA ) cream Apply 1 Application topically as needed. 30 g 0   lidocaine -prilocaine  (EMLA ) cream Apply to affected area once 30 g 3   metFORMIN  (GLUCOPHAGE -XR) 500 MG 24 hr tablet Take 1 tablet (500 mg total) by mouth daily with evening meal. 90 tablet 3   metoprolol  succinate (TOPROL -XL) 25 MG 24 hr tablet Take 1 tablet (25 mg total) by mouth daily. 90 tablet 3   multivitamin (ONE-A-DAY MEN'S) TABS tablet Take 1 tablet by mouth daily with breakfast.     ondansetron  (ZOFRAN ) 8  MG tablet Take 1 tablet (8 mg total) by mouth every 8 (eight) hours as needed for nausea or vomiting. 30 tablet 1   pantoprazole  (PROTONIX ) 40 MG tablet Take 1 tablet (40 mg total) by mouth daily. 30 tablet 1   pantoprazole  (PROTONIX ) 40 MG tablet Take 1 tablet (40 mg total) by mouth 2 (two) times daily. 180 tablet 3   potassium chloride  (KLOR-CON ) 10 MEQ tablet Take 2 tablets (20 mEq total) by mouth daily. 60 tablet 3   prochlorperazine  (COMPAZINE ) 10 MG tablet Take 1 tablet (10 mg total) by mouth every 6 (six) hours as needed for nausea or vomiting. 30 tablet 1   rivaroxaban  (XARELTO ) 20 MG TABS tablet Take 1 tablet (20 mg total) by mouth daily with supper. 90 tablet 3   spironolactone  (ALDACTONE ) 25 MG tablet Take 0.5 tablets (12.5 mg total) by mouth daily.     spironolactone  (ALDACTONE ) 25 MG tablet Take 1/2 tablet (12.5 mg total) by mouth daily. 15 tablet 1   sucralfate  (CARAFATE ) 1 GM/10ML  suspension Take 10 mLs (1 g total) by mouth 4 (four) times daily -  with meals and at bedtime. 420 mL 1   traMADol  (ULTRAM ) 50 MG tablet Take 1 tablet (50 mg total) by mouth every 6 (six) hours as needed. 60 tablet 0   pantoprazole  (PROTONIX ) 40 MG tablet Take 1 tablet (40 mg total) by mouth 2 (two) times daily. 180 tablet 3   No current facility-administered medications for this visit.   Facility-Administered Medications Ordered in Other Visits  Medication Dose Route Frequency Provider Last Rate Last Admin   0.9 %  sodium chloride  infusion   Intravenous Continuous Lanny Callander, MD   Stopped at 03/02/24 1150   sodium chloride  flush (NS) 0.9 % injection 10 mL  10 mL Intracatheter PRN Lanny Callander, MD   10 mL at 03/02/24 1154    HISTORY OF PRESENT ILLNESS:   Oncology History Overview Note  Cancer Staging Gastric cancer [facility]) Staging form: Stomach, AJCC 8th Edition - Clinical stage from 12/01/2020: Stage IIB (cT3, cN0, cM0) - Signed by Lanny Callander, MD on 12/20/2020 Stage prefix: Initial diagnosis Total positive nodes: 0  Malignant neoplasm of prostate (HCC) Staging form: Prostate, AJCC 8th Edition - Clinical: Stage IIC (cT2b, cN0, cM0, PSA: 6.7, Grade Group: 4) - Unsigned Prostate specific antigen (PSA) range: Less than 10 Gleason score: 8 Histologic grading system: 5 grade system    Gastric cancer (HCC)  12/01/2020 Procedure   Upper Endoscopy, Dr. Rollin  Impression: - Normal esophagus. 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 Biopsied. - Normal examined duodenum.   12/01/2020 Pathology Results   FINAL MICROSCOPIC DIAGNOSIS:   A. INCISURA, BIOPSY:  - Adenocarcinoma, moderate to poorly differentiated arising in a  background of chronic gastritis with intestinal metaplasia.    12/01/2020 Imaging   CT CAP  IMPRESSION: Focal wall thickening along the posterior aspect of the gastric antrum, likely corresponding to the patient's newly diagnosed gastric cancer.   No findings  suspicious for metastatic disease.   Mild multifocal pneumonia, lower lobe predominant, likely on the basis of aspiration. Trace right pleural effusion.   Fiducial markers along the prostate in this patient with known prostate cancer.   12/01/2020 Cancer Staging   Staging form: Stomach, AJCC 8th Edition - Clinical stage from 12/01/2020: Stage IIB (cT3, cN0, cM0) - Signed by Lanny Callander, MD on 12/20/2020 Stage prefix: Initial diagnosis Total positive nodes: 0   12/08/2020 Initial Diagnosis   Gastric cancer (  HCC)   12/16/2020 Procedure   EUS  - Wall thickening was seen in the lesser curve of the stomach and in the antrum of the stomach. The thickening appeared to be primarily within the serosa (Layer 5). T3 N0 Mx. - There was no sign of significant pathology in the entire pancreas. - There was no sign of significant pathology in the common bile duct and in the gallbladder. - There was no evidence of significant pathology in the left lobe of the liver. - Endosonographic images of the left adrenal gland were unremarkable. - The celiac trunk and superior mesenteric artery were endosonographically normal. - The mediastinum was unremarkable endosonographically. - No specimens collected.   01/02/2021 - 03/23/2021 Chemotherapy   Patient is on Treatment Plan : GASTROESOPHAGEAL FLOT q14d X 4 cycles     04/01/2021 Imaging   EXAM: CT ABDOMEN AND PELVIS WITHOUT CONTRAST  IMPRESSION: 1. No acute findings in the abdomen or pelvis. Specifically, no evidence for metastatic disease in the abdomen or pelvis. 2. Stable 16 mm left adrenal adenoma. 3. Small fat containing hernias in the right groin and umbilical region. 4. Bilateral pars interarticularis defects at L4 with 10 mm anterolisthesis of L4 on 5, stable. 5. Aortic Atherosclerosis (ICD10-I70.0).   04/13/2021 Imaging   EXAM: CT CHEST WITHOUT CONTRAST  IMPRESSION: 1. No evidence of metastatic disease in the chest. 2. Enlargement of the  main pulmonary artery, as can be seen in pulmonary hypertension. 3. Coronary artery disease.   Aortic Atherosclerosis (ICD10-I70.0).   06/09/2021 Procedure   Upper GI Endoscopy, Dr. Rollin  Findings: -The esophagus was normal. -A medium healed ulcer was found on the lesser curvature of the stomach. Biopsies were taken with a cold forceps for histology. -The examined duodenum was normal. -Compared to the prior EGD there is a marked improvement in his gastric cancer. There was only evidence of erythema and a central scar at the incisura. Cold biopsies of the area were obtained, but there was no gross evidence of malignancy.  Impression: - Normal esophagus. - Scar in the lesser curvature of the stomach. Biopsied. - Normal examined duodenum.   06/09/2021 Pathology Results   FINAL MICROSCOPIC DIAGNOSIS:   A. STOMACH, BIOPSY:  - Gastric mucosa with acute and chronic inflammation.  - No dysplasia or malignancy identified.    06/16/2021 - 06/16/2021 Chemotherapy   Patient is on Treatment Plan : GASTROESOPHAGEAL Pembrolizumab (200) q21d     10/28/2023 -  Chemotherapy   Patient is on Treatment Plan : GASTRIC Nivolumab  (240) D1,15,29 + Ipilimumab  (1) D1 q42d         REVIEW OF SYSTEMS:   Constitutional: Denies fevers, chills or abnormal weight loss. Intermittent cold sweats. Improving in severity and frequency.  Eyes: Denies blurriness of vision Ears, nose, mouth, throat, and face: Denies mucositis or sore throat Respiratory: Denies cough, dyspnea or wheezes Cardiovascular: Denies palpitation, chest discomfort or lower extremity swelling Gastrointestinal:  Denies nausea, heartburn or change in bowel habits Skin: Denies abnormal skin rashes Lymphatics: Denies new lymphadenopathy or easy bruising Neurological:Denies numbness, tingling or new weaknesses Behavioral/Psych: Mood is stable, no new changes  All other systems were reviewed with the patient and are negative.   VITALS:   Today's  Vitals   02/17/24 0952 02/17/24 1033  BP: 106/60   Pulse: (!) 52   Resp: 17   Temp: 97.6 F (36.4 C)   SpO2: 99%   Weight: 163 lb 9.6 oz (74.2 kg)   PainSc:  0-No pain  Body mass index is 26.41 kg/m.   Wt Readings from Last 3 Encounters:  03/02/24 162 lb 12.8 oz (73.8 kg)  02/17/24 163 lb 9.6 oz (74.2 kg)  02/04/24 165 lb 9.6 oz (75.1 kg)    Body mass index is 26.41 kg/m.  Performance status (ECOG): 1 - Symptomatic but completely ambulatory  PHYSICAL EXAM:   GENERAL:alert, no distress and comfortable SKIN: skin color, texture, turgor are normal, no rashes or significant lesions EYES: normal, Conjunctiva are pink and non-injected, sclera clear OROPHARYNX:no exudate, no erythema and lips, buccal mucosa, and tongue normal  NECK: supple, thyroid  normal size, non-tender, without nodularity LYMPH:  no palpable lymphadenopathy in the cervical, axillary or inguinal LUNGS: clear to auscultation and percussion with normal breathing effort HEART: regular rate & rhythm and no murmurs and no lower extremity edema ABDOMEN:abdomen soft, non-tender and normal bowel sounds Musculoskeletal:no cyanosis of digits and no clubbing  NEURO: alert & oriented x 3 with fluent speech, no focal motor/sensory deficits  LABORATORY DATA:  I have reviewed the data as listed    Component Value Date/Time   NA 136 03/02/2024 0910   NA 141 10/08/2018 0756   K 3.9 03/02/2024 0910   CL 99 03/02/2024 0910   CO2 23 03/02/2024 0910   GLUCOSE 137 (H) 03/02/2024 0910   BUN 27 (H) 03/02/2024 0910   BUN 19 10/08/2018 0756   CREATININE 1.51 (H) 03/02/2024 0910   CALCIUM  9.5 03/02/2024 0910   PROT 7.9 03/02/2024 0910   PROT 7.0 07/31/2023 1047   ALBUMIN 4.6 03/02/2024 0910   ALBUMIN 4.6 06/06/2016 1453   AST 29 03/02/2024 0910   ALT 41 03/02/2024 0910   ALKPHOS 63 03/02/2024 0910   BILITOT 0.7 03/02/2024 0910   GFRNONAA 48 (L) 03/02/2024 0910   GFRAA 74 10/08/2018 0756    Lab Results  Component  Value Date   WBC 6.6 03/02/2024   NEUTROABS 5.2 03/02/2024   HGB 14.0 03/02/2024   HCT 42.3 03/02/2024   MCV 95.1 03/02/2024   PLT 193 03/02/2024     RADIOGRAPHIC STUDIES: NM PET Image Restage (PS) Skull Base to Thigh (F-18 FDG) Result Date: 02/17/2024 CLINICAL DATA:  Subsequent treatment strategy for gastric cancer. EXAM: NUCLEAR MEDICINE PET SKULL BASE TO THIGH TECHNIQUE: 8.1 mCi F-18 FDG was injected intravenously. Full-ring PET imaging was performed from the skull base to thigh after the radiotracer. CT data was obtained and used for attenuation correction and anatomic localization. Fasting blood glucose: 104 mg/dl COMPARISON:  CT abdomen pelvis 11/05/2023, 10/18/2023. FINDINGS: Mediastinal blood pool activity: SUV max 2.4 Liver activity: SUV max NA NECK: No abnormal hypermetabolism. Incidental CT findings: None. CHEST: New patchy areas of hypermetabolic ground-glass in the anterior left upper lobe (7/18), right middle lobe (7/35) and right lower lobe (7/37). New hypermetabolic pulmonary nodules with index nodule in the superior segment left lower lobe measuring 7 mm (7/20), SUV max 3.0. Borderline hypermetabolic mediastinal lymph nodes with index low right paratracheal lymph node measuring 9 mm, SUV max 2.5. Incidental CT findings: Atherosclerotic calcification of the aorta, aortic valve and coronary arteries. Heart size normal. No pericardial effusion. Trace bilateral pleural effusions. ABDOMEN/PELVIS: Marked improvement in gastric antrum hypermetabolism, now SUV max 3.4, previously 17.5. Otherwise, no abnormal hypermetabolism. Incidental CT findings: Small low-attenuation lesion in the inferior right hepatic lobe, unchanged. Liver, gallbladder, adrenal glands, kidneys, spleen, pancreas, stomach and bowel are otherwise grossly unremarkable, with exception of a knuckle of small bowel extending into a right inguinal hernia. Atherosclerotic calcification  of the aorta. SKELETON: No abnormal  hypermetabolism. Incidental CT findings: Degenerative changes in the spine. Bilateral L5 pars defects with grade 2 anterolisthesis, better seen on 11/05/2023. IMPRESSION: 1. Marked improvement in hypermetabolism associated with the gastric antrum, compatible with treatment response. 2. New hypermetabolic patchy ground-glass in the left upper, right middle and right lower lobes, possibly due to bronchopneumonia. Additional new nodular areas of hypermetabolism in the superior segments of both lower lobes may be infectious in etiology but metastatic disease is also considered. Consider short-term follow-up CT chest in 3-4 weeks in further evaluation. 3. Borderline hypermetabolic mediastinal lymph nodes, possibly reactive in etiology. Recommend attention on follow-up. 4. Trace bilateral pleural effusions. 5. Right inguinal hernia contains a knuckle of small bowel. 6. Aortic atherosclerosis (ICD10-I70.0). Coronary artery calcification. Electronically Signed   By: Newell Eke M.D.   On: 02/17/2024 10:32   "

## 2024-02-16 NOTE — Assessment & Plan Note (Addendum)
 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  S/p FLOT4 01/02/2021 - 03/22/2021 -Posttreatment EUS 05/2021 and 06/29/2022 by Dr. Rollin showed a scar and residual erythema in stomach, biopsy negative for residual malignancy.  -Mr Granholm is clinically doing well on surveillance. Last CT 03/20/22 which shows no evidence of recurrent or metastatic disease, and stable bilateral adrenal adenomas -Surveillance CT scan from October 09, 2022 showed no evidence of recurrence -Surveillance CT scan from April 30, 2023 showed no evidence of residual or recurrent malignancy.  Small liver lesion is stable, favored to be benign. -He unfortunately had local recurrence in 09/2023 -he started Nivo 240mg  q2w and Ipi 1mg /kg q6w (for 2 doses only) on 10/28/2023 -02/04/2024 -tolerating treatment with nivolumab  well.  Today, he presents for cycle 3 day 15 without complaint.  PET scan scheduled on 02/10/2024.  Will follow-up on 02/17/2024 as scheduled. - 02/17/2024 -awaiting results of PET scan done 02/10/2024.  Call to radiology reading room today prior to today's visit.  Labs and patient presentation are appropriate for treatment and will proceed as scheduled.

## 2024-02-17 ENCOUNTER — Inpatient Hospital Stay: Payer: Self-pay

## 2024-02-17 ENCOUNTER — Encounter: Payer: Self-pay | Admitting: Hematology

## 2024-02-17 ENCOUNTER — Inpatient Hospital Stay: Payer: Self-pay | Attending: Hematology | Admitting: Nurse Practitioner

## 2024-02-17 VITALS — BP 101/63 | HR 59 | Resp 14

## 2024-02-17 VITALS — BP 106/60 | HR 52 | Temp 97.6°F | Resp 17 | Wt 163.6 lb

## 2024-02-17 DIAGNOSIS — Z5112 Encounter for antineoplastic immunotherapy: Secondary | ICD-10-CM | POA: Insufficient documentation

## 2024-02-17 DIAGNOSIS — D509 Iron deficiency anemia, unspecified: Secondary | ICD-10-CM

## 2024-02-17 DIAGNOSIS — C163 Malignant neoplasm of pyloric antrum: Secondary | ICD-10-CM

## 2024-02-17 DIAGNOSIS — Z79620 Long term (current) use of immunosuppressive biologic: Secondary | ICD-10-CM | POA: Diagnosis not present

## 2024-02-17 LAB — CMP (CANCER CENTER ONLY)
ALT: 35 U/L (ref 0–44)
AST: 29 U/L (ref 15–41)
Albumin: 4.4 g/dL (ref 3.5–5.0)
Alkaline Phosphatase: 68 U/L (ref 38–126)
Anion gap: 11 (ref 5–15)
BUN: 34 mg/dL — ABNORMAL HIGH (ref 8–23)
CO2: 25 mmol/L (ref 22–32)
Calcium: 9.6 mg/dL (ref 8.9–10.3)
Chloride: 104 mmol/L (ref 98–111)
Creatinine: 1.46 mg/dL — ABNORMAL HIGH (ref 0.61–1.24)
GFR, Estimated: 50 mL/min — ABNORMAL LOW
Glucose, Bld: 107 mg/dL — ABNORMAL HIGH (ref 70–99)
Potassium: 4.2 mmol/L (ref 3.5–5.1)
Sodium: 141 mmol/L (ref 135–145)
Total Bilirubin: 0.4 mg/dL (ref 0.0–1.2)
Total Protein: 7.6 g/dL (ref 6.5–8.1)

## 2024-02-17 LAB — CBC WITH DIFFERENTIAL (CANCER CENTER ONLY)
Abs Immature Granulocytes: 0.01 K/uL (ref 0.00–0.07)
Basophils Absolute: 0 K/uL (ref 0.0–0.1)
Basophils Relative: 0 %
Eosinophils Absolute: 0.1 K/uL (ref 0.0–0.5)
Eosinophils Relative: 1 %
HCT: 40.1 % (ref 39.0–52.0)
Hemoglobin: 13 g/dL (ref 13.0–17.0)
Immature Granulocytes: 0 %
Lymphocytes Relative: 14 %
Lymphs Abs: 0.7 K/uL (ref 0.7–4.0)
MCH: 30.8 pg (ref 26.0–34.0)
MCHC: 32.4 g/dL (ref 30.0–36.0)
MCV: 95 fL (ref 80.0–100.0)
Monocytes Absolute: 0.5 K/uL (ref 0.1–1.0)
Monocytes Relative: 11 %
Neutro Abs: 3.7 K/uL (ref 1.7–7.7)
Neutrophils Relative %: 74 %
Platelet Count: 243 K/uL (ref 150–400)
RBC: 4.22 MIL/uL (ref 4.22–5.81)
RDW: 17.1 % — ABNORMAL HIGH (ref 11.5–15.5)
WBC Count: 5 K/uL (ref 4.0–10.5)
nRBC: 0 % (ref 0.0–0.2)

## 2024-02-17 LAB — FERRITIN: Ferritin: 53 ng/mL (ref 24–336)

## 2024-02-17 MED ORDER — SODIUM CHLORIDE 0.9 % IV SOLN
240.0000 mg | Freq: Once | INTRAVENOUS | Status: AC
  Administered 2024-02-17: 240 mg via INTRAVENOUS
  Filled 2024-02-17: qty 24

## 2024-02-17 MED ORDER — SODIUM CHLORIDE 0.9 % IV SOLN: INTRAVENOUS | Status: DC

## 2024-02-17 NOTE — Patient Instructions (Signed)
 [facility] - A DEPT OF [facility]   Discharge Instructions: Thank you for choosing [facility] to provide your oncology and hematology care.   If you have a lab appointment with the Cancer Center, please go directly to the Cancer Center and check in at the registration area.   Wear comfortable clothing and clothing appropriate for easy access to any Portacath or PICC line.   We strive to give you quality time with your provider. You may need to reschedule your appointment if you arrive late (15 or more minutes).  Arriving late affects you and other patients whose appointments are after yours.  Also, if you miss three or more appointments without notifying the office, you may be dismissed from the clinic at the provider's discretion.      For prescription refill requests, have your pharmacy contact our office and allow 72 hours for refills to be completed.    Today you received the following chemotherapy and/or immunotherapy agents: Nivolumab (Opdivo)      To help prevent nausea and vomiting after your treatment, we encourage you to take your nausea medication as directed.  BELOW ARE SYMPTOMS THAT SHOULD BE REPORTED IMMEDIATELY: *FEVER GREATER THAN 100.4 F (38 C) OR HIGHER *CHILLS OR SWEATING *NAUSEA AND VOMITING THAT IS NOT CONTROLLED WITH YOUR NAUSEA MEDICATION *UNUSUAL SHORTNESS OF BREATH *UNUSUAL BRUISING OR BLEEDING *URINARY PROBLEMS (pain or burning when urinating, or frequent urination) *BOWEL PROBLEMS (unusual diarrhea, constipation, pain near the anus) TENDERNESS IN MOUTH AND THROAT WITH OR WITHOUT PRESENCE OF ULCERS (sore throat, sores in mouth, or a toothache) UNUSUAL RASH, SWELLING OR PAIN  UNUSUAL VAGINAL DISCHARGE OR ITCHING   Items with * indicate a potential emergency and should be followed up as soon as possible or go to the Emergency Department if any problems should occur.  Please show the CHEMOTHERAPY ALERT CARD or  IMMUNOTHERAPY ALERT CARD at check-in to the Emergency Department and triage nurse.  Should you have questions after your visit or need to cancel or reschedule your appointment, please contact [facility] - A DE[facility]  Dept: [phone]  and follow the prompts.  Office hours are 8:00 a.m. to 4:30 p.m. Monday - Friday. Please note that voicemails left after 4:00 p.m. may not be returned until the following business day.  We are closed weekends and major holidays. You have access to a nurse at all times for urgent questions. Please call the main number to the clinic Dept: [phone] and follow the prompts.   For any non-urgent questions, you may also contact your provider using MyChart. We now offer e-Visits for anyone 4 and older to request care online for non-urgent symptoms. For details visit [email or web address].   Also download the MyChart app! Go to the app store, search "MyChart", open the app, select [place], and log in with your MyChart username and password.

## 2024-02-19 ENCOUNTER — Other Ambulatory Visit (HOSPITAL_COMMUNITY): Payer: Self-pay

## 2024-02-20 ENCOUNTER — Other Ambulatory Visit (HOSPITAL_COMMUNITY): Payer: Self-pay

## 2024-02-21 ENCOUNTER — Other Ambulatory Visit (HOSPITAL_COMMUNITY): Payer: Self-pay

## 2024-02-21 ENCOUNTER — Encounter: Payer: Self-pay | Admitting: Hematology

## 2024-02-21 MED ORDER — PANTOPRAZOLE SODIUM 40 MG PO TBEC
40.0000 mg | DELAYED_RELEASE_TABLET | Freq: Two times a day (BID) | ORAL | 3 refills | Status: AC
  Filled 2024-02-21: qty 180, 90d supply, fill #0

## 2024-02-24 ENCOUNTER — Other Ambulatory Visit (HOSPITAL_COMMUNITY): Payer: Self-pay

## 2024-02-27 ENCOUNTER — Other Ambulatory Visit (HOSPITAL_COMMUNITY): Payer: Self-pay

## 2024-02-28 ENCOUNTER — Other Ambulatory Visit (HOSPITAL_COMMUNITY): Payer: Self-pay

## 2024-03-01 NOTE — Assessment & Plan Note (Signed)
-  diagnosed with Gleason 4+4 prostate cancer in early 2019. He was treated with long-term ADT in combination with 8 weeks of IMRT.

## 2024-03-01 NOTE — Assessment & Plan Note (Signed)
 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  S/p FLOT4 01/02/2021 - 03/22/2021 -Posttreatment EUS 05/2021 and 06/29/2022 by Dr. Rollin showed a scar and residual erythema in stomach, biopsy negative for residual malignancy.  -Jon Williams is clinically doing well on surveillance. Last CT 03/20/22 which shows no evidence of recurrent or metastatic disease, and stable bilateral adrenal adenomas -Surveillance CT scan from October 09, 2022 showed no evidence of recurrence -Surveillance CT scan from April 30, 2023 showed no evidence of residual or recurrent malignancy.  Small liver lesion is stable, favored to be benign. -He unfortunately had local recurrence in 09/2023 -he started Nivo 240mg  q2w and Ipi 1mg /kg q6w (for 2 doses only) on 10/28/2023 and has been tolerating well  -restaging PET 02/10/2024 showed near complete response.

## 2024-03-02 ENCOUNTER — Inpatient Hospital Stay: Admitting: Hematology

## 2024-03-02 ENCOUNTER — Inpatient Hospital Stay

## 2024-03-02 ENCOUNTER — Other Ambulatory Visit: Payer: Self-pay

## 2024-03-02 ENCOUNTER — Encounter: Payer: Self-pay | Admitting: Nurse Practitioner

## 2024-03-02 ENCOUNTER — Other Ambulatory Visit (HOSPITAL_COMMUNITY): Payer: Self-pay

## 2024-03-02 ENCOUNTER — Encounter: Payer: Self-pay | Admitting: Hematology

## 2024-03-02 VITALS — BP 114/72 | HR 62 | Temp 97.5°F | Resp 17 | Wt 162.8 lb

## 2024-03-02 DIAGNOSIS — C61 Malignant neoplasm of prostate: Secondary | ICD-10-CM | POA: Diagnosis not present

## 2024-03-02 DIAGNOSIS — D509 Iron deficiency anemia, unspecified: Secondary | ICD-10-CM

## 2024-03-02 DIAGNOSIS — C163 Malignant neoplasm of pyloric antrum: Secondary | ICD-10-CM

## 2024-03-02 DIAGNOSIS — Z5112 Encounter for antineoplastic immunotherapy: Secondary | ICD-10-CM | POA: Diagnosis not present

## 2024-03-02 LAB — CMP (CANCER CENTER ONLY)
ALT: 41 U/L (ref 0–44)
AST: 29 U/L (ref 15–41)
Albumin: 4.6 g/dL (ref 3.5–5.0)
Alkaline Phosphatase: 63 U/L (ref 38–126)
Anion gap: 14 (ref 5–15)
BUN: 27 mg/dL — ABNORMAL HIGH (ref 8–23)
CO2: 23 mmol/L (ref 22–32)
Calcium: 9.5 mg/dL (ref 8.9–10.3)
Chloride: 99 mmol/L (ref 98–111)
Creatinine: 1.51 mg/dL — ABNORMAL HIGH (ref 0.61–1.24)
GFR, Estimated: 48 mL/min — ABNORMAL LOW
Glucose, Bld: 137 mg/dL — ABNORMAL HIGH (ref 70–99)
Potassium: 3.9 mmol/L (ref 3.5–5.1)
Sodium: 136 mmol/L (ref 135–145)
Total Bilirubin: 0.7 mg/dL (ref 0.0–1.2)
Total Protein: 7.9 g/dL (ref 6.5–8.1)

## 2024-03-02 LAB — CBC WITH DIFFERENTIAL (CANCER CENTER ONLY)
Abs Immature Granulocytes: 0.01 K/uL (ref 0.00–0.07)
Basophils Absolute: 0 K/uL (ref 0.0–0.1)
Basophils Relative: 0 %
Eosinophils Absolute: 0.1 K/uL (ref 0.0–0.5)
Eosinophils Relative: 1 %
HCT: 42.3 % (ref 39.0–52.0)
Hemoglobin: 14 g/dL (ref 13.0–17.0)
Immature Granulocytes: 0 %
Lymphocytes Relative: 11 %
Lymphs Abs: 0.7 K/uL (ref 0.7–4.0)
MCH: 31.5 pg (ref 26.0–34.0)
MCHC: 33.1 g/dL (ref 30.0–36.0)
MCV: 95.1 fL (ref 80.0–100.0)
Monocytes Absolute: 0.7 K/uL (ref 0.1–1.0)
Monocytes Relative: 10 %
Neutro Abs: 5.2 K/uL (ref 1.7–7.7)
Neutrophils Relative %: 78 %
Platelet Count: 193 K/uL (ref 150–400)
RBC: 4.45 MIL/uL (ref 4.22–5.81)
RDW: 16.4 % — ABNORMAL HIGH (ref 11.5–15.5)
WBC Count: 6.6 K/uL (ref 4.0–10.5)
nRBC: 0 % (ref 0.0–0.2)

## 2024-03-02 LAB — FERRITIN: Ferritin: 65 ng/mL (ref 24–336)

## 2024-03-02 LAB — TSH: TSH: 2.67 u[IU]/mL (ref 0.350–4.500)

## 2024-03-02 MED ORDER — SODIUM CHLORIDE 0.9 % IV SOLN
240.0000 mg | Freq: Once | INTRAVENOUS | Status: AC
  Administered 2024-03-02: 240 mg via INTRAVENOUS
  Filled 2024-03-02: qty 24

## 2024-03-02 MED ORDER — SODIUM CHLORIDE 0.9% FLUSH
10.0000 mL | INTRAVENOUS | Status: DC | PRN
  Administered 2024-03-02: 10 mL

## 2024-03-02 MED ORDER — SODIUM CHLORIDE 0.9 % IV SOLN: INTRAVENOUS | Status: DC

## 2024-03-02 NOTE — Progress Notes (Signed)
 " [facility]   [phone] [phone]   Clinic Follow up Note   Patient Care Team: [name], MD as PCP - General (Internal Medicine) [name], Aleene PARAS, MD (Inactive) as PCP - Cardiology (Cardiology) Waddell Danelle ORN, MD as PCP - Electrophysiology (Cardiology) Dasie Leonor CROME, MD as Consulting Physician (General Surgery) Lanny Callander, MD as Consulting Physician (Hematology) Rollin Dover, MD as Consulting Physician (Gastroenterology)  Date of Service:  03/02/2024  CHIEF COMPLAINT: f/u of current gastric cancer  CURRENT THERAPY:  Nivolumab  every 2 weeks  Oncology History   Malignant neoplasm of prostate [facility]) -diagnosed with Gleason 4+4 prostate cancer in early 2019. He was treated with long-term ADT in combination with 8 weeks of IMRT.   Gastric cancer (HCC) cT3N0M0, stage II -Diagnosed 11/2020, path showed loss of MLH1 and PMS2 which predicts good response to immunotherapy  -Deemed a high risk surgical candidate.  S/p FLOT4 01/02/2021 - 03/22/2021 -Posttreatment EUS 05/2021 and 06/29/2022 by Dr. Rollin showed a scar and residual erythema in stomach, biopsy negative for residual malignancy.  -[facility] Williams is clinically doing well on surveillance. Last CT 03/20/22 which shows no evidence of recurrent or metastatic disease, and stable bilateral adrenal adenomas -Surveillance CT scan from October 09, 2022 showed no evidence of recurrence -Surveillance CT scan from April 30, 2023 showed no evidence of residual or recurrent malignancy.  Small liver lesion is stable, favored to be benign. -He unfortunately had local recurrence in 09/2023 -he started Nivo 240mg  q2w and Ipi 1mg /kg q6w (for 2 doses only) on 10/28/2023 and has been tolerating well  -restaging PET 02/10/2024 showed near complete response.  Assessment & Plan Recurrent gastric cancer He demonstrates a near complete response to current therapy, as evidenced by recent PET scan showing significant  reduction in tumor activity compared to four months prior. He tolerates treatment well, without gastrointestinal symptoms, pain, or changes in bowel habits, and maintains good nutritional status. - Reviewed PET scan results, confirming near complete response. - Recommended continuation of current treatment regimen. - Discussed future endoscopy as an option to assess for residual disease if indicated. - Planned to proceed with infusion as scheduled, contingent on assessment of oxygenation and overall status.  Possible Immunotherapy-induced pneumonitis vs atypical infection Likely secondary to current immunotherapy, with new right-sided pulmonary inflammation on imaging. He remains largely asymptomatic, with only mild intermittent morning cough, no significant dyspnea, and no oxygen requirement. - Ordered repeat chest CT prior to next appointment (in approximately two weeks) to monitor for progression or resolution. - Planned to check oxygen saturation with ambulation in clinic prior to infusion. - Discussed that immunotherapy will be held and corticosteroids initiated if pneumonitis worsens.  Plan - I personally reviewed his restaging PET scan from January 12, 2024, and discussed the finding with patient and his wife. - His oxygen level remains Jon Williams when he walked 5 minutes in my office - Will proceed nivolumab  today every 2 weeks - Follow-up in 2 weeks with a repeated CT chest without contrast a few days before   SUMMARY OF ONCOLOGIC HISTORY: Oncology History Overview Note  Cancer Staging Gastric cancer [facility]) Staging form: Stomach, AJCC 8th Edition - Clinical stage from 12/01/2020: Stage IIB (cT3, cN0, cM0) - Signed by Lanny Callander, MD on 12/20/2020 Stage prefix: Initial diagnosis Total positive nodes: 0  Malignant neoplasm of prostate (HCC) Staging form: Prostate, AJCC 8th Edition - Clinical: Stage IIC (cT2b, cN0, cM0, PSA: 6.7, Grade Group: 4) - Unsigned Prostate specific antigen (PSA)  range: Less than 10 Gleason score: 8 Histologic grading system: 5 grade system    Gastric cancer (HCC)  12/01/2020 Procedure   Upper Endoscopy, Dr. Rollin  Impression: - Normal esophagus. 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 Biopsied. - Normal examined duodenum.   12/01/2020 Pathology Results   FINAL MICROSCOPIC DIAGNOSIS:   A. INCISURA, BIOPSY:  - Adenocarcinoma, moderate to poorly differentiated arising in a  background of chronic gastritis with intestinal metaplasia.    12/01/2020 Imaging   CT CAP  IMPRESSION: Focal wall thickening along the posterior aspect of the gastric antrum, likely corresponding to the patient's newly diagnosed gastric cancer.   No findings suspicious for metastatic disease.   Mild multifocal pneumonia, lower lobe predominant, likely on the basis of aspiration. Trace right pleural effusion.   Fiducial markers along the prostate in this patient with known prostate cancer.   12/01/2020 Cancer Staging   Staging form: Stomach, AJCC 8th Edition - Clinical stage from 12/01/2020: Stage IIB (cT3, cN0, cM0) - Signed by Lanny Callander, MD on 12/20/2020 Stage prefix: Initial diagnosis Total positive nodes: 0   12/08/2020 Initial Diagnosis   Gastric cancer (HCC)   12/16/2020 Procedure   EUS  - Wall thickening was seen in the lesser curve of the stomach and in the antrum of the stomach. The thickening appeared to be primarily within the serosa (Layer 5). T3 N0 Mx. - There was no sign of significant pathology in the entire pancreas. - There was no sign of significant pathology in the common bile duct and in the gallbladder. - There was no evidence of significant pathology in the left lobe of the liver. - Endosonographic images of the left adrenal gland were unremarkable. - The celiac trunk and superior mesenteric artery were endosonographically normal. - The mediastinum was unremarkable endosonographically. - No specimens collected.   01/02/2021 -  03/23/2021 Chemotherapy   Patient is on Treatment Plan : GASTROESOPHAGEAL FLOT q14d X 4 cycles     04/01/2021 Imaging   EXAM: CT ABDOMEN AND PELVIS WITHOUT CONTRAST  IMPRESSION: 1. No acute findings in the abdomen or pelvis. Specifically, no evidence for metastatic disease in the abdomen or pelvis. 2. Stable 16 mm left adrenal adenoma. 3. Small fat containing hernias in the right groin and umbilical region. 4. Bilateral pars interarticularis defects at L4 with 10 mm anterolisthesis of L4 on 5, stable. 5. Aortic Atherosclerosis (ICD10-I70.0).   04/13/2021 Imaging   EXAM: CT CHEST WITHOUT CONTRAST  IMPRESSION: 1. No evidence of metastatic disease in the chest. 2. Enlargement of the main pulmonary artery, as can be seen in pulmonary hypertension. 3. Coronary artery disease.   Aortic Atherosclerosis (ICD10-I70.0).   06/09/2021 Procedure   Upper GI Endoscopy, Dr. Rollin  Findings: -The esophagus was normal. -A medium healed ulcer was found on the lesser curvature of the stomach. Biopsies were taken with a cold forceps for histology. -The examined duodenum was normal. -Compared to the prior EGD there is a marked improvement in his gastric cancer. There was only evidence of erythema and a central scar at the incisura. Cold biopsies of the area were obtained, but there was no gross evidence of malignancy.  Impression: - Normal esophagus. - Scar in the lesser curvature of the stomach. Biopsied. - Normal examined duodenum.   06/09/2021 Pathology Results   FINAL MICROSCOPIC DIAGNOSIS:   A. STOMACH, BIOPSY:  - Gastric mucosa with acute and chronic inflammation.  - No dysplasia or malignancy identified.    06/16/2021 - 06/16/2021 Chemotherapy  Patient is on Treatment Plan : GASTROESOPHAGEAL Pembrolizumab (200) q21d     10/28/2023 -  Chemotherapy   Patient is on Treatment Plan : GASTRIC Nivolumab  (240) D1,15,29 + Ipilimumab  (1) D1 q42d        Discussed the use of AI scribe software  for clinical note transcription with the patient, who gave verbal consent to proceed.  History of Present Illness Jon [facility] is a 74 year old male with recurrent gastric cancer undergoing immunotherapy who presents for follow-up to assess treatment response and monitor for immunotherapy-induced pneumonitis.  He is on single-agent immunotherapy after prior combination therapy and is tolerating it well, with no abdominal pain, preserved appetite, and regular bowel movements. He had one self-limited episode of gastrointestinal upset and no fever. PET imaging three weeks ago showed near complete response with marked reduction in tumor activity compared to September 2025.  Recent imaging showed new right-sided pulmonary inflammation. He has no persistent cough or shortness of breath. He notes mild morning cough with mucus and intermittent mild morning dyspnea, both improving. He does not use supplemental oxygen and can ambulate daily at home with a wheeled walker and walk in the clinic hallway for five minutes without significant limitation.     All other systems were reviewed with the patient and are negative.  MEDICAL HISTORY:  Past Medical History:  Diagnosis Date   AICD (automatic cardioverter/defibrillator) present 05/25/2016   biv icd   Angio-edema    Bunion, right    Chronic combined systolic and diastolic CHF (congestive heart failure) (HCC)    Echo 1/18: Mild conc LVH, EF 15-20, severe diff HK, inf and inf-septal AK, Gr 3 DD, mild to mod Jon, severe LAE, mod reduced RVSF, mod RAE, mild TR, PASP 50   Coronary artery disease involving native coronary artery without angina pectoris 04/17/2016   LHC 1/18: pLCx 30, mLCx 20, mRCA 40, dRCA 20, LVEDP 23, mean RA 8, PA 42/20, PCWP 17   Diabetes mellitus without complication (HCC)    Gastric cancer (HCC)    History of atrial fibrillation    History of atrial flutter    History of cardiomegaly 06/07/2016   Noted on CXR   History of colon  polyps 06/28/2017   Noted on colonoscopy   Hypertension    LBBB (left bundle branch block)    Nausea vomiting and diarrhea 04/04/2021   NICM (nonischemic cardiomyopathy) (HCC)    Echo 1/18:  Mild conc LVH, EF 15-20, severe diff HK, inf and inf-septal AK, Gr 3 DD, mild to mod Jon, severe LAE, mod reduced RVSF, mod RAE, mild TR, PASP 50   Other secondary pulmonary hypertension (HCC) 04/17/2016   Prostate cancer (HCC) 2019   Sigmoid diverticulosis 06/28/2017   Noted on colonoscopy    SURGICAL HISTORY: Past Surgical History:  Procedure Laterality Date   BIOPSY  12/01/2020   Procedure: BIOPSY;  Surgeon: Rollin Dover, MD;  Location[facility] ENDOSCOPY;  Service: Endoscopy;;   BIOPSY  06/09/2021   Procedure: BIOPSY;  Surgeon: Rollin Dover, MD;  Location: [facility] ENDOSCOPY;  Service: Gastroenterology;;   BIOPSY  06/29/2022   Procedure: BIOPSY;  Surgeon: Rollin Dover, MD;  Location: [facility] ENDOSCOPY;  Service: Gastroenterology;;   BIV ICD INSERTION CRT-D N/A 05/25/2016   Procedure: BiV ICD Insertion CRT-D;  Surgeon: Danelle LELON Birmingham, MD;  Location: [facility] INVASIVE CV LAB;  Service: Cardiovascular;  Laterality: N/A;   CARDIAC CATHETERIZATION N/A 03/02/2016   Procedure: Right/Left Heart Cath and Coronary Angiography;  Surgeon: Lonni Hanson, MD;  Location: [facility] INVASIVE CV LAB;  Service: Cardiovascular;  Laterality: N/A;   CARDIOVERSION N/A 07/17/2016   Procedure: Cardioversion;  Surgeon: Waddell Danelle ORN, MD;  Location: [facility] INVASIVE CV LAB;  Service: Cardiovascular;  Laterality: N/A;   COLONOSCOPY WITH PROPOFOL  N/A 06/28/2017   Procedure: COLONOSCOPY WITH PROPOFOL ;  Surgeon: Rollin Dover, MD;  Location: [facility] ENDOSCOPY;  Service: Endoscopy;  Laterality: N/A;   colonscopy  2009   ESOPHAGOGASTRODUODENOSCOPY N/A 12/01/2020   Procedure: ESOPHAGOGASTRODUODENOSCOPY (EGD);  Surgeon: Rollin Dover, MD;  Location: [facility] ENDOSCOPY;  Service: Endoscopy;  Laterality: N/A;  IDA/guaiac positive stools   ESOPHAGOGASTRODUODENOSCOPY N/A  10/04/2023   Procedure: EGD (ESOPHAGOGASTRODUODENOSCOPY);  Surgeon: Rollin Dover, MD;  Location: [facility]OSCOPY;  Service: Gastroenterology;  Laterality: N/A;   ESOPHAGOGASTRODUODENOSCOPY (EGD) WITH PROPOFOL  N/A 12/16/2020   Procedure: ESOPHAGOGASTRODUODENOSCOPY (EGD) WITH PROPOFOL ;  Surgeon: Rollin Dover, MD;  Location: [facility] ENDOSCOPY;  Service: Endoscopy;  Laterality: N/A;   ESOPHAGOGASTRODUODENOSCOPY (EGD) WITH PROPOFOL  N/A 06/09/2021   Procedure: ESOPHAGOGASTRODUODENOSCOPY (EGD) WITH PROPOFOL ;  Surgeon: Rollin Dover, MD;  Location: [facility] ENDOSCOPY;  Service: Gastroenterology;  Laterality: N/A;   ESOPHAGOGASTRODUODENOSCOPY (EGD) WITH PROPOFOL  N/A 06/29/2022   Procedure: ESOPHAGOGASTRODUODENOSCOPY (EGD) WITH PROPOFOL ;  Surgeon: Rollin Dover, MD;  Location: [facility] ENDOSCOPY;  Service: Gastroenterology;  Laterality: N/A;   GOLD SEED IMPLANT N/A 12/24/2017   Procedure: GOLD SEED IMPLANT, TRANSERINEAL;  Surgeon: Nieves Cough, MD;  Location: [facility] ORS;  Service: Urology;  Laterality: N/A;   INSERT / REPLACE / REMOVE PACEMAKER     IR ANGIOGRAM SELECTIVE EACH ADDITIONAL VESSEL  11/04/2023   IR ANGIOGRAM SELECTIVE EACH ADDITIONAL VESSEL  11/04/2023   IR CV LINE INJECTION  10/25/2023   IR REMOVAL TUN ACCESS W/ PORT W/O FL MOD SED  11/04/2023   IR US  GUIDE VASC ACCESS RIGHT  11/04/2023   IR VENOCAVAGRAM SVC  11/04/2023   LEAD REVISION  10/10/2018   LEAD REVISION/REPAIR N/A 10/10/2018   Procedure: LEAD REVISION/REPAIR;  Surgeon: Waddell Danelle ORN, MD;  Location: [facility] INVASIVE CV LAB;  Service: Cardiovascular;  Laterality: N/A;   POLYPECTOMY  06/28/2017   Procedure: POLYPECTOMY;  Surgeon: Rollin Dover, MD;  Location: [facility] ENDOSCOPY;  Service: Endoscopy;;  ascending and descending colon polyp   PORTACATH PLACEMENT Right 12/23/2020   Procedure: INSERTION PORT-A-CATH;  Surgeon: Dasie Leonor CROME, MD;  Location: [facility] OR;  Service: General;  Laterality: Right;   PROSTATE BIOPSY  02/20/2017   RIGHT HEART CATH N/A 07/25/2023    Procedure: RIGHT HEART CATH;  Surgeon: Rolan Ezra RAMAN, MD;  Location: [facility] CV [facility];  Service: Cardiovascular;  Laterality: N/A;   RIGHT/LEFT HEART CATH AND CORONARY ANGIOGRAPHY N/A 10/30/2022   Procedure: RIGHT/LEFT HEART CATH AND CORONARY ANGIOGRAPHY;  Surgeon: Rolan Ezra RAMAN, MD;  Location: [facility] INVAS[facility] CV LAB;  Service: Cardiovascular;  Laterality: N/A;   SPACE OAR INSTILLATION N/A 12/24/2017   Procedure: SPACE OAR INSTILLATION;  Surgeon: Nieves Cough, MD;  Location: [facility] ORS;  Service: Urology;  Laterality: N/A;   TOTAL KNEE ARTHROPLASTY Right 08/16/2017   Procedure: RIGHT TOTAL KNEE ARTHROPLASTY;  Surgeon: Shari Sieving, MD;  Location: [facility] OR;  Service: Orthopedics;  Laterality: Right;   UPPER ESOPHAGEAL ENDOSCOPIC ULTRASOUND (EUS) N/A 12/16/2020   Procedure: UPPER ESOPHAGEAL ENDOSCOPIC ULTRASOUND (EUS);  Surgeon: Rollin Dover, MD;  Location: [facility]COPY;  Service: Endoscopy;  Laterality: N/A;    I have reviewed the social history and family history with the patient and they are unchanged from previous note.  ALLERGIES:  is allergic to aspirin , sacubitril -valsartan , sulfa antibiotics, and  ace inhibitors.  MEDICATIONS:  Current Outpatient Medications  Medication Sig Dispense Refill   Accu-Chek Softclix Lancets lancets Use to check your blood sugar finger stick once a day Dx Code E11.9 90 days 100 each 3   amiodarone  (PACERONE ) 200 MG tablet Take 1 tablet (200 mg total) by mouth daily. 90 tablet 3   atorvastatin  (LIPITOR) 40 MG tablet Take 1 tablet (40 mg total) by mouth daily. 90 tablet 3   Biotin 1000 MCG tablet Take 1,000 mcg by mouth daily with breakfast.     Blood Glucose Monitoring Suppl (ACCU-CHEK GUIDE) w/Device KIT as directed to check blood sugar once a day 1 kit 0   cetirizine  (ZYRTEC ) 10 MG tablet Take 1 tablet (10 mg total) by mouth daily. 30 tablet 0   cholecalciferol  (VITAMIN D3) 25 MCG (1000 UT) tablet Take 1,000 Units by mouth daily with breakfast.     digoxin   (LANOXIN ) 0.125 MG tablet Take 1/2 tablet (0.0625mg  total) by mouth daily. 15 tablet 6   empagliflozin  (JARDIANCE ) 10 MG TABS tablet Take 1 tablet (10 mg total) by mouth daily. 90 tablet 3   EPINEPHrine  0.3 mg/0.3 mL IJ SOAJ injection Inject 0.3 mg into the muscle as needed for anaphylaxis. 2 each 1   ferrous sulfate 325 (65 FE) MG tablet Take 325 mg by mouth daily with breakfast.     furosemide  (LASIX ) 40 MG tablet Take 1 tablet (40 mg total) by mouth every other day. Alternating with 20 mg. 30 tablet 6   gabapentin  (NEURONTIN ) 100 MG capsule Take 1 capsule (100 mg total) by mouth 2 (two) times daily. 180 capsule 1   glucose blood (ACCU-CHEK GUIDE TEST) test strip Use to check blood sugars once daily. 100 each 3   hydrOXYzine  (ATARAX ) 25 MG tablet Take 1/2 tablet (12.5 mg total) by mouth every 8 (eight) hours as needed for up to 6 doses for itching. 3 tablet 0   ipratropium (ATROVENT ) 0.03 % nasal spray Place 1-2 sprays into both nostrils 2 (two) times daily as needed (nasal drainage). 30 mL 2   lidocaine -prilocaine  (EMLA ) cream Apply 1 Application topically as needed. 30 g 0   lidocaine -prilocaine  (EMLA ) cream Apply to affected area once 30 g 3   metFORMIN  (GLUCOPHAGE -XR) 500 MG 24 hr tablet Take 1 tablet (500 mg total) by mouth daily with evening meal. 90 tablet 3   metoprolol  succinate (TOPROL -XL) 25 MG 24 hr tablet Take 1 tablet (25 mg total) by mouth daily. 90 tablet 3   multivitamin (ONE-A-DAY MEN'S) TABS tablet Take 1 tablet by mouth daily with breakfast.     ondansetron  (ZOFRAN ) 8 MG tablet Take 1 tablet (8 mg total) by mouth every 8 (eight) hours as needed for nausea or vomiting. 30 tablet 1   pantoprazole  (PROTONIX ) 40 MG tablet Take 1 tablet (40 mg total) by mouth daily. 30 tablet 1   pantoprazole  (PROTONIX ) 40 MG tablet Take 1 tablet (40 mg total) by mouth 2 (two) times daily. 180 tablet 3   pantoprazole  (PROTONIX ) 40 MG tablet Take 1 tablet (40 mg total) by mouth 2 (two) times daily.  180 tablet 3   potassium chloride  (KLOR-CON ) 10 MEQ tablet Take 2 tablets (20 mEq total) by mouth daily. 60 tablet 3   prochlorperazine  (COMPAZINE ) 10 MG tablet Take 1 tablet (10 mg total) by mouth every 6 (six) hours as needed for nausea or vomiting. 30 tablet 1   rivaroxaban  (XARELTO ) 20 MG TABS tablet Take 1 tablet (20 mg total) by  mouth daily with supper. 90 tablet 3   spironolactone  (ALDACTONE ) 25 MG tablet Take 0.5 tablets (12.5 mg total) by mouth daily.     spironolactone  (ALDACTONE ) 25 MG tablet Take 1/2 tablet (12.5 mg total) by mouth daily. 15 tablet 1   sucralfate  (CARAFATE ) 1 GM/10ML suspension Take 10 mLs (1 g total) by mouth 4 (four) times daily -  with meals and at bedtime. 420 mL 1   traMADol  (ULTRAM ) 50 MG tablet Take 1 tablet (50 mg total) by mouth every 6 (six) hours as needed. 60 tablet 0   No current facility-administered medications for this visit.    PHYSICAL EXAMINATION: ECOG PERFORMANCE STATUS: 1 - Symptomatic but completely ambulatory  Vitals:   03/02/24 0954  BP: 114/72  Pulse: 62  Resp: 17  Temp: (!) 97.5 F (36.4 C)  SpO2: 97%   Wt Readings from Last 3 Encounters:  03/02/24 162 lb 12.8 oz (73.8 kg)  02/17/24 163 lb 9.6 oz (74.2 kg)  02/04/24 165 lb 9.6 oz (75.1 kg)     GENERAL:alert, no distress and comfortable SKIN: skin color, texture, turgor are normal, no rashes or significant lesions EYES: normal, Conjunctiva are pink and non-injected, sclera clear NECK: supple, thyroid  normal size, non-tender, without nodularity LYMPH:  no palpable lymphadenopathy in the cervical, axillary  LUNGS: clear to auscultation and percussion with normal breathing effort HEART: regular rate & rhythm and no murmurs and no lower extremity edema ABDOMEN:abdomen soft, non-tender and normal bowel sounds Musculoskeletal:no cyanosis of digits and no clubbing  NEURO: alert & oriented x 3 with fluent speech, no focal motor/sensory deficits  Physical Exam    LABORATORY  DATA:  I have reviewed the data as listed    Latest Ref Rng & Units 03/02/2024    9:10 AM 02/17/2024    8:57 AM 02/04/2024    9:08 AM  CBC  WBC 4.0 - 10.5 K/uL 6.6  5.0  7.0   Hemoglobin 13.0 - 17.0 g/dL 85.9  86.9  87.2   Hematocrit 39.0 - 52.0 % 42.3  40.1  39.3   Platelets 150 - 400 K/uL 193  243  190         Latest Ref Rng & Units 03/02/2024    9:10 AM 02/17/2024    8:57 AM 02/04/2024    9:08 AM  CMP  Glucose 70 - 99 mg/dL 862  892  897   BUN 8 - 23 mg/dL 27  34  26   Creatinine 0.61 - 1.24 mg/dL 8.48  8.53  8.70   Sodium 135 - 145 mmol/L 136  141  141   Potassium 3.5 - 5.1 mmol/L 3.9  4.2  4.5   Chloride 98 - 111 mmol/L 99  104  105   CO2 22 - 32 mmol/L 23  25  24    Calcium  8.9 - 10.3 mg/dL 9.5  9.6  9.6   Total Protein 6.5 - 8.1 g/dL 7.9  7.6  7.4   Total Bilirubin 0.0 - 1.2 mg/dL 0.7  0.4  0.6   Alkaline Phos 38 - 126 U/L 63  68  63   AST 15 - 41 U/L 29  29  34   ALT 0 - 44 U/L 41  35  45       RADIOGRAPHIC STUDIES: I have personally reviewed the radiological images as listed and agreed with the findings in the report. No results found.    Orders Placed This Encounter  Procedures   CT Chest  Wo Contrast    Standing Status:   Future    Expected Date:   03/09/2024    Expiration Date:   03/02/2025    Preferred imaging location?:   [facility]   All questions were answered. The patient knows to call the clinic with any problems, questions or concerns. No barriers to learning was detected. The total time spent in the appointment was 30 minutes, including review of chart and various tests results, discussions about plan of care and coordination of care plan     Onita Mattock, MD 03/02/2024     "

## 2024-03-02 NOTE — Patient Instructions (Signed)
 [facility] - A DEPT OF [facility]  Discharge Instructions: Thank you for choosing [facility] to provide your oncology and hematology care.   If you have a lab appointment with the Cancer Center, please go directly to the Cancer Center and check in at the registration area.   Wear comfortable clothing and clothing appropriate for easy access to any Portacath or PICC line.   We strive to give you quality time with your provider. You may need to reschedule your appointment if you arrive late (15 or more minutes).  Arriving late affects you and other patients whose appointments are after yours.  Also, if you miss three or more appointments without notifying the office, you may be dismissed from the clinic at the provider's discretion.      For prescription refill requests, have your pharmacy contact our office and allow 72 hours for refills to be completed.    Today you received the following chemotherapy and/or immunotherapy agents: Opdivo    To help prevent nausea and vomiting after your treatment, we encourage you to take your nausea medication as directed.  BELOW ARE SYMPTOMS THAT SHOULD BE REPORTED IMMEDIATELY: *FEVER GREATER THAN 100.4 F (38 C) OR HIGHER *CHILLS OR SWEATING *NAUSEA AND VOMITING THAT IS NOT CONTROLLED WITH YOUR NAUSEA MEDICATION *UNUSUAL SHORTNESS OF BREATH *UNUSUAL BRUISING OR BLEEDING *URINARY PROBLEMS (pain or burning when urinating, or frequent urination) *BOWEL PROBLEMS (unusual diarrhea, constipation, pain near the anus) TENDERNESS IN MOUTH AND THROAT WITH OR WITHOUT PRESENCE OF ULCERS (sore throat, sores in mouth, or a toothache) UNUSUAL RASH, SWELLING OR PAIN  UNUSUAL VAGINAL DISCHARGE OR ITCHING   Items with * indicate a potential emergency and should be followed up as soon as possible or go to the Emergency Department if any problems should occur.  Please show the CHEMOTHERAPY ALERT CARD or IMMUNOTHERAPY ALERT  CARD at check-in to the Emergency Department and triage nurse.  Should you have questions after your visit or need to cancel or reschedule your appointment, please contact [facility] - A DEPT OF [facility]  Dept: [phone]  and follow the prompts.  Office hours are 8:00 a.m. to 4:30 p.m. Monday - Friday. Please note that voicemails left after 4:00 p.m. may not be returned until the following business day.  We are closed weekends and major holidays. You have access to a nurse at all times for urgent questions. Please call the main number to the clinic Dept: [phone] and follow the prompts.   For any non-urgent questions, you may also contact your provider using MyChart. We now offer e-Visits for anyone 24 and older to request care online for non-urgent symptoms. For details visit [email or web address].   Also download the MyChart app! Go to the app store, search MyChart, open the app, select [facility], and log in with your MyChart username and password.

## 2024-03-03 ENCOUNTER — Other Ambulatory Visit (HOSPITAL_COMMUNITY): Payer: Self-pay

## 2024-03-03 LAB — T4: T4, Total: 12.1 ug/dL — ABNORMAL HIGH (ref 4.5–12.0)

## 2024-03-04 ENCOUNTER — Other Ambulatory Visit (HOSPITAL_COMMUNITY): Payer: Self-pay

## 2024-03-05 ENCOUNTER — Other Ambulatory Visit (HOSPITAL_COMMUNITY): Payer: Self-pay

## 2024-03-06 ENCOUNTER — Other Ambulatory Visit: Payer: Self-pay

## 2024-03-06 ENCOUNTER — Other Ambulatory Visit (HOSPITAL_COMMUNITY): Payer: Self-pay

## 2024-03-10 ENCOUNTER — Other Ambulatory Visit (HOSPITAL_COMMUNITY): Payer: Self-pay

## 2024-03-10 ENCOUNTER — Other Ambulatory Visit (HOSPITAL_COMMUNITY)

## 2024-03-11 ENCOUNTER — Ambulatory Visit (HOSPITAL_COMMUNITY)

## 2024-03-11 ENCOUNTER — Other Ambulatory Visit (HOSPITAL_COMMUNITY): Payer: Self-pay

## 2024-03-12 ENCOUNTER — Other Ambulatory Visit (HOSPITAL_COMMUNITY): Payer: Self-pay

## 2024-03-13 ENCOUNTER — Ambulatory Visit (HOSPITAL_COMMUNITY)
Admission: RE | Admit: 2024-03-13 | Discharge: 2024-03-13 | Disposition: A | Source: Ambulatory Visit | Attending: Hematology

## 2024-03-13 ENCOUNTER — Other Ambulatory Visit (HOSPITAL_COMMUNITY): Payer: Self-pay

## 2024-03-13 DIAGNOSIS — C163 Malignant neoplasm of pyloric antrum: Secondary | ICD-10-CM | POA: Diagnosis present

## 2024-03-16 ENCOUNTER — Inpatient Hospital Stay: Admitting: Hematology

## 2024-03-16 ENCOUNTER — Inpatient Hospital Stay: Payer: Self-pay

## 2024-03-16 ENCOUNTER — Other Ambulatory Visit: Payer: Self-pay | Admitting: Nurse Practitioner

## 2024-03-16 ENCOUNTER — Inpatient Hospital Stay

## 2024-03-16 ENCOUNTER — Other Ambulatory Visit (HOSPITAL_COMMUNITY): Payer: Self-pay

## 2024-03-16 ENCOUNTER — Inpatient Hospital Stay: Payer: Self-pay | Admitting: Nurse Practitioner

## 2024-03-16 MED ORDER — GABAPENTIN 100 MG PO CAPS: 100.0000 mg | ORAL_CAPSULE | Freq: Two times a day (BID) | ORAL | 1 refills | Status: AC

## 2024-03-19 ENCOUNTER — Other Ambulatory Visit (HOSPITAL_COMMUNITY): Payer: Self-pay

## 2024-03-19 NOTE — Progress Notes (Signed)
 "  ADVANCED HF CLINIC NOTE   Primary Care: [name], MD Primary Cardiologist: Aleene Passe, MD (Inactive) HF Cardiologist: Dr. Rolan  Reason for Visit: f/u for systolic heart failure  HPI: Jon Williams is a 74 y.o. with a history of chronic systolic heart failure, St Jude ICD, permanent atrial fibrillation, CAD, DMII, LBBB, HTN, gastric cancer 2022, and prostate cancer 2019.    Cath 2018 showed mild to moderate non-obstructive CAD. Fick CI 1.9.    Echo 10/22 showed EF 20-25%, mod-severe Jon, mod TR, RV nl   Echo 2/23 showed EF 20-25%, mild Jon, RV not well visualized   Admitted 8/23 with HF. Diuresed with IV lasix  and GDMT adjusted. Echo showed EF 20-25%, RV mildly reduced, LA severely dilated, moderate-severe Jon.    Had follow up with Dr Lanny 09/2022. CT scan no evidence of recurrent cancer. Plan for repeat CT in 8 months.    Admitted 9/24 with CHF. Echo showed EF 10-15%, G3DD, RV mildly reduced. R/LHC showed nonobstructive CAD, normal filling pressures, and low cardiac index. Unable to get cMRI as device not compatible. GDMT titrated and he was discharged home, weight 183 lbs.  Echo 12/24 EF 20% with severe LV dilation, normal RV, severe LAE, moderate Jon, IVC normal.   RHC 6/25 showing normal filling pressures but low cardiac output.   Seen in the ER in 6/25 with angioedema, followed up with allergist and was taken off Entresto .   Zio monitor to quantify PVCs showed monitor 1: 152 VT runs, 100% AF burden, 27.6% PVC burden; monitor 2 showed 49 VT runs, 100% AF burden, 23.2 % PVC burden.  Follow up with EP 7/25, planning for BiV upgrade.  Seen in ED 08/30/23 with abd pain. CT chest and abd re-assuring. He was discharged with GI follow up. Saw Dr. Rollin and treating for reflux.  He has continued to struggle w/ GI symptoms, including postprandial pain and wt loss. Went back to the ED 8/25. ED course/hospital work unfortunately showed malignant appearing mass in the lesser  curvature of the stomach on abdominal CT. Underwent EGD w/ biopsy. Pathology c/w adenocarcinoma. This has just resulted.    09/2023 Recurrent gastric cancer. Started on immunotherapy. Plan for repeat PET in December.   Today he returns for AHF follow up. Overall feeling ok. Denies palpitations, CP, dizziness, edema, or PND/Orthopnea. SOB with activity. Appetite is good. No fever or chills. Weight at home 162-163 pounds. Taking all medications. Denies ETOH, tobacco or drug use. Drinks <64 oz/day. Now sleeping on 2 pillows, has been doing this for a couple of weeks now. SBP at home 110-120s.   Echo today <20%, LV with GHK, LV mod dilated, RV normal, LA severely dilated, severe Jon, mod TR (lead related), RA pressure 15 mmHg.   EKG: V paced 69 bpm (Personally reviewed)    St Jude device interrogation:  CorVue elevated at 16. A fib burden > 99% (Personally reviewed)    PMH: 1. H/o gastric cancer, w/ recurrence (8/25, path c/w adenocarcinoma)  2. H/o prostate cancer.  3. Type 2 DM 4. Atrial fibrillation: Permanent 5. Chronic systolic CHF: Nonischemic cardiomyopathy. St Jude ICD, unable to find adequate coronary vein for LV lead.   - R/LHC (2018): Mild to moderate non obstructive CAD; RA 8, PA 42/20 (29), PCWP 17, CO/CI (Fick) 3.76/ 1.9  - Echo (10/22): EF 20-25%, mod-severe Jon, mod TR, RV normal - Echo (2/23): EF 20-25%, mild Jon, RV not well visualized - Echo (8/24): EF 20-25%, RV mildly  reduced, LA severely dilated, moderate to severe Jon - R/LHC (9/24): nonobstructive CAD; RA 1, PA 28/10 (17), PCWP 8, Fick CI 2.16, thermo CI 1.84.  - Echo (9/24): EF 10-15%, severe LV dilation, mild RV dysfunction with moderate RV enlargement, moderate-severe Jon.  - Echo (12/24): EF 20% with severe LV dilation, normal RV, severe LAE, moderate Jon, IVC normal.  - RHC (6/25): mean RA 4, PA 29/12 mean 19, mean PCWP 11, CI 2.2 Fick/1.87 thermo, PAPi 4.25 - Suspect Entresto -related angioedema.  6. Fe deficiency  anemia 7. Spinal stenosis  FH: mother CVA, MI  Review of Systems: All systems reviewed and negative except as per HPI.   Current Outpatient Medications  Medication Sig Dispense Refill   Accu-Chek Softclix Lancets lancets Use to check your blood sugar finger stick once a day Dx Code E11.9 90 days 100 each 3   amiodarone  (PACERONE ) 200 MG tablet Take 1 tablet (200 mg total) by mouth daily. 90 tablet 3   atorvastatin  (LIPITOR) 40 MG tablet Take 1 tablet (40 mg total) by mouth daily. 90 tablet 3   Biotin 1000 MCG tablet Take 1,000 mcg by mouth daily with breakfast.     Blood Glucose Monitoring Suppl (ACCU-CHEK GUIDE) w/Device KIT as directed to check blood sugar once a day 1 kit 0   cetirizine  (ZYRTEC ) 10 MG tablet Take 1 tablet (10 mg total) by mouth daily. 30 tablet 0   cholecalciferol  (VITAMIN D3) 25 MCG (1000 UT) tablet Take 1,000 Units by mouth daily with breakfast.     digoxin  (LANOXIN ) 0.125 MG tablet Take 1/2 tablet (0.0625mg  total) by mouth daily. 15 tablet 6   empagliflozin  (JARDIANCE ) 10 MG TABS tablet Take 1 tablet (10 mg total) by mouth daily. 90 tablet 3   EPINEPHrine  0.3 mg/0.3 mL IJ SOAJ injection Inject 0.3 mg into the muscle as needed for anaphylaxis. 2 each 1   ferrous sulfate 325 (65 FE) MG tablet Take 325 mg by mouth daily with breakfast.     gabapentin  (NEURONTIN ) 100 MG capsule Take 1 capsule (100 mg total) by mouth 2 (two) times daily. 180 capsule 1   glucose blood (ACCU-CHEK GUIDE TEST) test strip Use to check blood sugars once daily. 100 each 3   hydrOXYzine  (ATARAX ) 25 MG tablet Take 1/2 tablet (12.5 mg total) by mouth every 8 (eight) hours as needed for up to 6 doses for itching. 3 tablet 0   metFORMIN  (GLUCOPHAGE -XR) 500 MG 24 hr tablet Take 1 tablet (500 mg total) by mouth daily with evening meal. 90 tablet 3   metoprolol  succinate (TOPROL -XL) 25 MG 24 hr tablet Take 1 tablet (25 mg total) by mouth daily. 90 tablet 3   multivitamin (ONE-A-DAY MEN'S) TABS tablet Take  1 tablet by mouth daily with breakfast.     ondansetron  (ZOFRAN ) 8 MG tablet Take 1 tablet (8 mg total) by mouth every 8 (eight) hours as needed for nausea or vomiting. 30 tablet 1   pantoprazole  (PROTONIX ) 40 MG tablet Take 1 tablet (40 mg total) by mouth 2 (two) times daily. 180 tablet 3   potassium chloride  (KLOR-CON ) 10 MEQ tablet Take 2 tablets (20 mEq total) by mouth daily. 60 tablet 3   prochlorperazine  (COMPAZINE ) 10 MG tablet Take 1 tablet (10 mg total) by mouth every 6 (six) hours as needed for nausea or vomiting. 30 tablet 1   rivaroxaban  (XARELTO ) 20 MG TABS tablet Take 1 tablet (20 mg total) by mouth daily with supper. 90 tablet 3  traMADol  (ULTRAM ) 50 MG tablet Take 1 tablet (50 mg total) by mouth every 6 (six) hours as needed. 60 tablet 0   furosemide  (LASIX ) 40 MG tablet Take 1 tablet (40 mg total) by mouth daily. 60 tablet 5   ipratropium (ATROVENT ) 0.03 % nasal spray Place 1-2 sprays into both nostrils 2 (two) times daily as needed (nasal drainage). 30 mL 2   lidocaine -prilocaine  (EMLA ) cream Apply 1 Application topically as needed. 30 g 0   lidocaine -prilocaine  (EMLA ) cream Apply to affected area once 30 g 3   pantoprazole  (PROTONIX ) 40 MG tablet Take 1 tablet (40 mg total) by mouth daily. 30 tablet 1   pantoprazole  (PROTONIX ) 40 MG tablet Take 1 tablet (40 mg total) by mouth 2 (two) times daily. 180 tablet 3   spironolactone  (ALDACTONE ) 25 MG tablet Take 1 tablet (25 mg total) by mouth daily. 30 tablet 5   sucralfate  (CARAFATE ) 1 GM/10ML suspension Take 10 mLs (1 g total) by mouth 4 (four) times daily -  with meals and at bedtime. 420 mL 1   No current facility-administered medications for this encounter.   Allergies  Allergen Reactions   Aspirin  Anaphylaxis and Hives   Sacubitril -Valsartan  Swelling    lip swelling, angioedema   Sulfa Antibiotics Anaphylaxis, Hives, Swelling and Other (See Comments)    Swollen lips   Ace Inhibitors     Possible angioedema   Social  History   Socioeconomic History   Marital status: Married    Spouse name: Not on file   Number of children: 2   Years of education: Not on file   Highest education level: Not on file  Occupational History   Occupation: retired  Tobacco Use   Smoking status: Never    Passive exposure: Never   Smokeless tobacco: Never  Vaping Use   Vaping status: Never Used  Substance and Sexual Activity   Alcohol use: No   Drug use: No   Sexual activity: Not Currently  Other Topics Concern   Not on file  Social History Narrative   Retired location manager. Married to Mrs. Carollee LOISE Budge. Daughter, Marko, lives in [place] . Son, Stark, lives in [place] .   Social Drivers of Health   Tobacco Use: Low Risk (03/02/2024)   Patient History    Smoking Tobacco Use: Never    Smokeless Tobacco Use: Never    Passive Exposure: Never  Financial Resource Strain: Low Risk (12/26/2021)   Overall Financial Resource Strain (CARDIA)    Difficulty of Paying Living Expenses: Not very hard  Food Insecurity: No Food Insecurity (09/27/2023)   Epic    Worried About Programme Researcher, Broadcasting/film/video in the Last Year: Never true    Ran Out of Food in the Last Year: Never true  Transportation Needs: No Transportation Needs (09/27/2023)   Epic    Lack of Transportation (Medical): No    Lack of Transportation (Non-Medical): No  Physical Activity: Insufficiently Active (09/26/2021)   Exercise Vital Sign    Days of Exercise per Week: 7 days    Minutes of Exercise per Session: 10 min  Stress: No Stress Concern Present (09/26/2021)   [employer] of Occupational Health - Occupational Stress Questionnaire    Feeling of Stress : Not at all  Social Connections: Unknown (09/28/2023)   Social Connection and Isolation Panel    Frequency of Communication with Friends and Family: Patient declined    Frequency of Social Gatherings with Friends and Family: Patient declined    Attends Religious  Services: Not on file    Active Member  of Clubs or Organizations: Not on file    Attends Club or Organization Meetings: Patient declined    Marital Status: Patient declined  Intimate Partner Violence: Not At Risk (09/28/2023)   Epic    Fear of Current or Ex-Partner: No    Emotionally Abused: No    Physically Abused: No    Sexually Abused: No  Depression (PHQ2-9): Low Risk (03/02/2024)   Depression (PHQ2-9)    PHQ-2 Score: 0  Alcohol Screen: Low Risk (09/26/2021)   Alcohol Screen    Last Alcohol Screening Score (AUDIT): 0  Housing: Low Risk (09/28/2023)   Epic    Unable to Pay for Housing in the Last Year: No    Number of Times Moved in the Last Year: 0    Homeless in the Last Year: No  Utilities: Not At Risk (09/27/2023)   Epic    Threatened with loss of utilities: No  Health Literacy: Not on file   BP 96/72   Pulse 71   Wt 74 kg (163 lb 3.2 oz)   SpO2 97%   BMI 26.34 kg/m   Wt Readings from Last 3 Encounters:  03/20/24 74 kg (163 lb 3.2 oz)  03/02/24 73.8 kg (162 lb 12.8 oz)  02/17/24 74.2 kg (163 lb 9.6 oz)   Physical Exam  Vitals:   03/20/24 1411  BP: 96/72  Pulse: 71  SpO2: 97%   General:  well appearing.  No respiratory difficulty. Arrived in [facility].  Neck: JVD ~12 cm.  Cor: Regular rate & rhythm. +2 Jon murmur. Lungs: clear, diminished bases Abd: mildly distended Extremities: no edema  Neuro: alert & oriented x 3. Affect pleasant.   ASSESSMENT & PLAN: 1. Chronic systolic CHF: Long-standing cardiomyopathy, nonischemic.  St Jude ICD, was unable to find adequate vein for LV lead. Device is not MRI compatible. Cath in 2018 with nonobstructive CAD but CI at that time was low at 1.9.  Echo in 8/23 showed EF 20-25%, mild RV dysfunction, moderate-severe Jon.  Admitted 9/24 with CHF. Echo showed EF 10-15%, G3DD, RV mildly reduced. R/LHC showed non-obstructive CAD, normal filling pressures and low CI (2.16 Fick/1.84 thermo). Echo 12/24 showed EF 20% with severe LV dilation, normal RV, severe LAE, moderate Jon, IVC  normal. Repeat RHC in 6/25 again showed low CI (2.2 F/1.87 T) with normal filling pressures. He had to stop Entresto /Valsartan  with angioedema. Based on low output on prior RHCs and markedly low EF, VAD was previously discussed but he has recurrent gastric cancer.   - Patient is RV pacing 89% of the time and also has a LBBB around 150 msec at baseline as well as frequent PVCs. Unable to place CS lead in the past. As above, he has seen Dr Waddell and planning BiV upgrade but with recurrent cancer this is on hold.   - NYHA IIIa. Volume status elevated on exam and by OptiVol. Increase lasix  to 60 mg daily for 2 days then increase daily dose to 40 mg daily (previously alternated between 40/20 QOD) Labs today, repeat next week at f/u.   - Continue digoxin  0.0625 mg daily. - Off valsartan  due to hypotension and reported lip swelling. (Off Entresto  with angioedema, not a candidate for ACEi or Entresto ). - Increase spironolactone  12.5>25 mg daily  - Continue Jardiance  10 mg daily - Continue Toprol  XL 25 mg daily. 2. Atrial fibrillation: Permanent and rate controlled  - Continue amio 200 mg daily + toprol   XL .  - Continue Xarelto . Denies abnormal bleeding. Some pink tinged mucous occasionally. Recent CBC WNL.  3. Gastric cancer: Initial occurrence in 2022, Treated. Now w/ recurrence as of 8/25. EGD w/ biopsy 8/25 c/w adenocarcinoma.  On immunotherapy.  Follows with Dr. Lanny 4. Type 2 diabetes: followed by PCP  - Continue Jardiance . Denies GU symptoms.  5. OSA: Suspected. He declines sleep study. 6. PVCs: Zio showed high PVC burden.  - Started on amiodarone  7/25 for suppression, will continue  - continue current dose of Toprol  XL.  7. Hypotension - BP low/stable today. Off valsartan . Reports normal readings at home.   Follow up next week with APP to reassess volume status. Will need repeat BMET.   Beckey LITTIE Coe AGACNP-BC  03/20/24  "

## 2024-03-20 ENCOUNTER — Ambulatory Visit (HOSPITAL_COMMUNITY): Admission: RE | Admit: 2024-03-20 | Source: Ambulatory Visit

## 2024-03-20 ENCOUNTER — Other Ambulatory Visit (HOSPITAL_COMMUNITY): Payer: Self-pay

## 2024-03-20 ENCOUNTER — Ambulatory Visit (HOSPITAL_COMMUNITY): Payer: Self-pay | Admitting: Internal Medicine

## 2024-03-20 ENCOUNTER — Other Ambulatory Visit: Payer: Self-pay | Admitting: Hematology

## 2024-03-20 ENCOUNTER — Encounter (HOSPITAL_COMMUNITY): Payer: Self-pay

## 2024-03-20 ENCOUNTER — Ambulatory Visit (HOSPITAL_COMMUNITY): Admission: RE | Admit: 2024-03-20 | Source: Ambulatory Visit | Attending: Adult Health | Admitting: Adult Health

## 2024-03-20 VITALS — BP 96/72 | HR 71 | Wt 163.2 lb

## 2024-03-20 DIAGNOSIS — I952 Hypotension due to drugs: Secondary | ICD-10-CM

## 2024-03-20 DIAGNOSIS — I4821 Permanent atrial fibrillation: Secondary | ICD-10-CM

## 2024-03-20 DIAGNOSIS — I5022 Chronic systolic (congestive) heart failure: Secondary | ICD-10-CM

## 2024-03-20 DIAGNOSIS — E119 Type 2 diabetes mellitus without complications: Secondary | ICD-10-CM

## 2024-03-20 DIAGNOSIS — I493 Ventricular premature depolarization: Secondary | ICD-10-CM

## 2024-03-20 DIAGNOSIS — C169 Malignant neoplasm of stomach, unspecified: Secondary | ICD-10-CM

## 2024-03-20 DIAGNOSIS — G4733 Obstructive sleep apnea (adult) (pediatric): Secondary | ICD-10-CM

## 2024-03-20 LAB — BASIC METABOLIC PANEL WITH GFR
Anion gap: 11 (ref 5–15)
BUN: 26 mg/dL — ABNORMAL HIGH (ref 8–23)
CO2: 24 mmol/L (ref 22–32)
Calcium: 9.6 mg/dL (ref 8.9–10.3)
Chloride: 105 mmol/L (ref 98–111)
Creatinine, Ser: 1.4 mg/dL — ABNORMAL HIGH (ref 0.61–1.24)
GFR, Estimated: 52 mL/min — ABNORMAL LOW
Glucose, Bld: 98 mg/dL (ref 70–99)
Potassium: 4.7 mmol/L (ref 3.5–5.1)
Sodium: 140 mmol/L (ref 135–145)

## 2024-03-20 LAB — ECHOCARDIOGRAM COMPLETE
Area-P 1/2: 4.49 cm2
Est EF: <20
MV M vel: 4.81 m/s
MV Peak grad: 92.5 mmHg
Radius: 0.86 cm
S' Lateral: 6 cm
Single Plane A4C EF: 10.8 %

## 2024-03-20 LAB — PRO BRAIN NATRIURETIC PEPTIDE: Pro Brain Natriuretic Peptide: 4381 pg/mL — ABNORMAL HIGH

## 2024-03-20 MED ORDER — SPIRONOLACTONE 25 MG PO TABS
25.0000 mg | ORAL_TABLET | Freq: Every day | ORAL | 5 refills | Status: AC
  Filled 2024-03-20: qty 30, 30d supply, fill #0

## 2024-03-20 MED ORDER — FUROSEMIDE 40 MG PO TABS
40.0000 mg | ORAL_TABLET | Freq: Every day | ORAL | 5 refills | Status: AC
  Filled 2024-03-20: qty 60, 60d supply, fill #0

## 2024-03-20 NOTE — Progress Notes (Signed)
" °  Echocardiogram 2D Echocardiogram has been performed.  Koleen KANDICE Popper, RDCS 03/20/2024, 1:58 PM "

## 2024-03-20 NOTE — Patient Instructions (Signed)
 Medication Changes:  INCREASE FUROSEMIDE  TO 60MG  DAILY FOR 2 DAYS   ON MONDAY- RETURN TO FUROSEMIDE  TO 40MG  DAILY THEREAFTER  INCREASE SPIRONOLACTONE  TO 25MG  ONCE DAILY   Lab Work:  Labs done today, your results will be available in MyChart, we will contact you for abnormal readings.  Follow-Up in: 1 WEEK AS SCHEDULED WITH APP CLINIC  At the [facility], you and your health needs are our priority. We have a designated team specialized in the treatment of Heart Failure. This Care Team includes your primary Heart Failure Specialized Cardiologist (physician), Advanced Practice Providers (APPs- Physician Assistants and Nurse Practitioners), and Pharmacist who all work together to provide you with the care you need, when you need it.   You may see any of the following providers on your designated Care Team at your next follow up:  Dr. Toribio Fuel Dr. Ezra Shuck Dr. Odis Brownie Greig Mosses, NP Caffie Shed, [place] [facility] [place], [place] Beckey Coe, NP Jordan Lee, NP Tinnie Redman, PharmD   Please be sure to bring in all your medications bottles to every appointment.   Need to Contact Us :  If you have any questions or concerns before your next appointment please send us  a message through [facility] or call our office at [phone].    TO LEAVE A MESSAGE FOR THE NURSE SELECT OPTION 2, PLEASE LEAVE A MESSAGE INCLUDING: YOUR NAME DATE OF BIRTH CALL BACK NUMBER REASON FOR CALL**this is important as we prioritize the call backs  YOU WILL RECEIVE A CALL BACK THE SAME DAY AS LONG AS YOU CALL BEFORE 4:00 PM

## 2024-03-26 ENCOUNTER — Inpatient Hospital Stay: Attending: Hematology

## 2024-03-26 ENCOUNTER — Inpatient Hospital Stay: Admitting: Hematology

## 2024-03-26 ENCOUNTER — Inpatient Hospital Stay

## 2024-03-27 ENCOUNTER — Ambulatory Visit (HOSPITAL_COMMUNITY)

## 2024-03-30 ENCOUNTER — Inpatient Hospital Stay

## 2024-03-30 ENCOUNTER — Inpatient Hospital Stay: Admitting: Hematology

## 2024-04-08 ENCOUNTER — Ambulatory Visit: Payer: Medicare HMO

## 2024-04-09 ENCOUNTER — Inpatient Hospital Stay: Admitting: Hematology

## 2024-04-09 ENCOUNTER — Inpatient Hospital Stay

## 2024-04-10 ENCOUNTER — Ambulatory Visit: Admitting: Podiatry

## 2024-07-03 ENCOUNTER — Ambulatory Visit (INDEPENDENT_AMBULATORY_CARE_PROVIDER_SITE_OTHER): Admitting: Physician Assistant
